# Patient Record
Sex: Female | Born: 1945 | Race: White | Hispanic: No | Smoking: Never smoker
Health system: Southern US, Community
[De-identification: ages and names within clinical notes are randomized; demographics above are authoritative.]

## PROBLEM LIST (undated history)

## (undated) DIAGNOSIS — M1990 Unspecified osteoarthritis, unspecified site: Secondary | ICD-10-CM

## (undated) DIAGNOSIS — I809 Phlebitis and thrombophlebitis of unspecified site: Secondary | ICD-10-CM

## (undated) DIAGNOSIS — M545 Low back pain, unspecified: Secondary | ICD-10-CM

## (undated) DIAGNOSIS — L309 Dermatitis, unspecified: Secondary | ICD-10-CM

## (undated) DIAGNOSIS — I639 Cerebral infarction, unspecified: Secondary | ICD-10-CM

## (undated) DIAGNOSIS — M7061 Trochanteric bursitis, right hip: Secondary | ICD-10-CM

## (undated) DIAGNOSIS — G894 Chronic pain syndrome: Secondary | ICD-10-CM

## (undated) DIAGNOSIS — K8020 Calculus of gallbladder without cholecystitis without obstruction: Secondary | ICD-10-CM

## (undated) DIAGNOSIS — I447 Left bundle-branch block, unspecified: Secondary | ICD-10-CM

## (undated) DIAGNOSIS — I8390 Asymptomatic varicose veins of unspecified lower extremity: Secondary | ICD-10-CM

## (undated) DIAGNOSIS — K5730 Diverticulosis of large intestine without perforation or abscess without bleeding: Secondary | ICD-10-CM

## (undated) DIAGNOSIS — F419 Anxiety disorder, unspecified: Secondary | ICD-10-CM

## (undated) DIAGNOSIS — K219 Gastro-esophageal reflux disease without esophagitis: Secondary | ICD-10-CM

## (undated) DIAGNOSIS — J309 Allergic rhinitis, unspecified: Secondary | ICD-10-CM

## (undated) DIAGNOSIS — H7291 Unspecified perforation of tympanic membrane, right ear: Secondary | ICD-10-CM

## (undated) DIAGNOSIS — N182 Chronic kidney disease, stage 2 (mild): Secondary | ICD-10-CM

## (undated) DIAGNOSIS — E7800 Pure hypercholesterolemia, unspecified: Secondary | ICD-10-CM

## (undated) DIAGNOSIS — M5030 Other cervical disc degeneration, unspecified cervical region: Secondary | ICD-10-CM

## (undated) DIAGNOSIS — M8580 Other specified disorders of bone density and structure, unspecified site: Secondary | ICD-10-CM

## (undated) DIAGNOSIS — K56609 Unspecified intestinal obstruction, unspecified as to partial versus complete obstruction: Secondary | ICD-10-CM

## (undated) DIAGNOSIS — S8991XA Unspecified injury of right lower leg, initial encounter: Secondary | ICD-10-CM

## (undated) DIAGNOSIS — N83209 Unspecified ovarian cyst, unspecified side: Secondary | ICD-10-CM

## (undated) DIAGNOSIS — I10 Essential (primary) hypertension: Secondary | ICD-10-CM

## (undated) DIAGNOSIS — B029 Zoster without complications: Secondary | ICD-10-CM

## (undated) DIAGNOSIS — G8929 Other chronic pain: Secondary | ICD-10-CM

## (undated) HISTORY — DX: Other specified disorders of bone density and structure, unspecified site: M85.80

## (undated) HISTORY — DX: Allergic rhinitis, unspecified: J30.9

## (undated) HISTORY — DX: Zoster without complications: B02.9

## (undated) HISTORY — PX: CHOLECYSTECTOMY, LAPAROSCOPIC: SHX56

## (undated) HISTORY — DX: Essential (primary) hypertension: I10

## (undated) HISTORY — DX: Unspecified ovarian cyst, unspecified side: N83.209

## (undated) HISTORY — DX: Low back pain, unspecified: M54.50

## (undated) HISTORY — DX: Anxiety disorder, unspecified: F41.9

## (undated) HISTORY — DX: Other chronic pain: G89.29

## (undated) HISTORY — PX: CHOLECYSTECTOMY: SHX55

## (undated) HISTORY — DX: Dermatitis, unspecified: L30.9

## (undated) HISTORY — DX: Trochanteric bursitis, right hip: M70.61

## (undated) HISTORY — DX: Diverticulosis of large intestine without perforation or abscess without bleeding: K57.30

## (undated) HISTORY — DX: Unspecified perforation of tympanic membrane, right ear: H72.91

## (undated) HISTORY — DX: Left bundle-branch block, unspecified: I44.7

## (undated) HISTORY — DX: Low back pain: M54.5

## (undated) HISTORY — DX: Calculus of gallbladder without cholecystitis without obstruction: K80.20

## (undated) HISTORY — PX: TUBAL LIGATION: SHX77

## (undated) HISTORY — DX: Gastro-esophageal reflux disease without esophagitis: K21.9

## (undated) HISTORY — DX: Chronic kidney disease, stage 2 (mild): N18.2

## (undated) HISTORY — DX: Unspecified osteoarthritis, unspecified site: M19.90

## (undated) HISTORY — DX: Unspecified injury of right lower leg, initial encounter: S89.91XA

## (undated) HISTORY — PX: COLONOSCOPY: SHX174

## (undated) HISTORY — DX: Other cervical disc degeneration, unspecified cervical region: M50.30

## (undated) HISTORY — PX: COLON SURGERY: SHX602

## (undated) HISTORY — DX: Pure hypercholesterolemia, unspecified: E78.00

## (undated) HISTORY — PX: RECTOCELE REPAIR: SHX761

## (undated) HISTORY — DX: Chronic pain syndrome: G89.4

## (undated) HISTORY — DX: Unspecified intestinal obstruction, unspecified as to partial versus complete obstruction: K56.609

## (undated) HISTORY — PX: OTHER SURGICAL HISTORY: SHX169

## (undated) HISTORY — DX: Cerebral infarction, unspecified: I63.9

---

## 1991-06-28 HISTORY — PX: OTHER SURGICAL HISTORY: SHX169

## 1998-03-04 ENCOUNTER — Ambulatory Visit (HOSPITAL_COMMUNITY): Admission: RE | Admit: 1998-03-04 | Discharge: 1998-03-04 | Payer: Self-pay | Admitting: Gynecology

## 1998-03-05 ENCOUNTER — Ambulatory Visit (HOSPITAL_COMMUNITY): Admission: RE | Admit: 1998-03-05 | Discharge: 1998-03-05 | Payer: Self-pay | Admitting: Gynecology

## 1999-03-09 ENCOUNTER — Encounter: Payer: Self-pay | Admitting: Gynecology

## 1999-03-09 ENCOUNTER — Ambulatory Visit (HOSPITAL_COMMUNITY): Admission: RE | Admit: 1999-03-09 | Discharge: 1999-03-09 | Payer: Self-pay | Admitting: Gynecology

## 1999-06-09 ENCOUNTER — Ambulatory Visit (HOSPITAL_COMMUNITY): Admission: RE | Admit: 1999-06-09 | Discharge: 1999-06-09 | Payer: Self-pay | Admitting: Gastroenterology

## 2000-04-19 ENCOUNTER — Encounter: Payer: Self-pay | Admitting: Gynecology

## 2000-04-19 ENCOUNTER — Ambulatory Visit (HOSPITAL_COMMUNITY): Admission: RE | Admit: 2000-04-19 | Discharge: 2000-04-19 | Payer: Self-pay | Admitting: Gynecology

## 2000-08-25 ENCOUNTER — Ambulatory Visit (HOSPITAL_COMMUNITY): Admission: RE | Admit: 2000-08-25 | Discharge: 2000-08-25 | Payer: Self-pay | Admitting: Neurosurgery

## 2000-08-25 ENCOUNTER — Encounter: Payer: Self-pay | Admitting: Neurosurgery

## 2001-05-03 ENCOUNTER — Encounter: Payer: Self-pay | Admitting: Gynecology

## 2001-05-03 ENCOUNTER — Ambulatory Visit (HOSPITAL_COMMUNITY): Admission: RE | Admit: 2001-05-03 | Discharge: 2001-05-03 | Payer: Self-pay | Admitting: Gynecology

## 2002-05-13 ENCOUNTER — Other Ambulatory Visit: Admission: RE | Admit: 2002-05-13 | Discharge: 2002-05-13 | Payer: Self-pay | Admitting: Gynecology

## 2002-09-04 ENCOUNTER — Encounter: Payer: Self-pay | Admitting: Neurosurgery

## 2002-09-04 ENCOUNTER — Encounter: Admission: RE | Admit: 2002-09-04 | Discharge: 2002-09-04 | Payer: Self-pay | Admitting: Neurosurgery

## 2003-04-08 ENCOUNTER — Other Ambulatory Visit: Admission: RE | Admit: 2003-04-08 | Discharge: 2003-04-08 | Payer: Self-pay | Admitting: Gynecology

## 2003-05-23 ENCOUNTER — Ambulatory Visit (HOSPITAL_COMMUNITY): Admission: RE | Admit: 2003-05-23 | Discharge: 2003-05-23 | Payer: Self-pay | Admitting: Gynecology

## 2004-03-23 ENCOUNTER — Other Ambulatory Visit: Admission: RE | Admit: 2004-03-23 | Discharge: 2004-03-23 | Payer: Self-pay | Admitting: Gynecology

## 2004-04-13 ENCOUNTER — Ambulatory Visit (HOSPITAL_COMMUNITY): Admission: RE | Admit: 2004-04-13 | Discharge: 2004-04-14 | Payer: Self-pay | Admitting: Neurosurgery

## 2004-04-17 ENCOUNTER — Observation Stay (HOSPITAL_COMMUNITY): Admission: AD | Admit: 2004-04-17 | Discharge: 2004-04-18 | Payer: Self-pay | Admitting: Neurological Surgery

## 2004-05-06 ENCOUNTER — Encounter: Admission: RE | Admit: 2004-05-06 | Discharge: 2004-05-06 | Payer: Self-pay | Admitting: Neurosurgery

## 2004-05-24 ENCOUNTER — Ambulatory Visit (HOSPITAL_COMMUNITY): Admission: RE | Admit: 2004-05-24 | Discharge: 2004-05-24 | Payer: Self-pay | Admitting: Gynecology

## 2004-06-03 ENCOUNTER — Encounter: Admission: RE | Admit: 2004-06-03 | Discharge: 2004-06-03 | Payer: Self-pay | Admitting: Neurosurgery

## 2004-08-25 ENCOUNTER — Ambulatory Visit: Payer: Self-pay | Admitting: Pulmonary Disease

## 2004-12-14 ENCOUNTER — Ambulatory Visit: Payer: Self-pay | Admitting: Pulmonary Disease

## 2005-05-16 ENCOUNTER — Ambulatory Visit: Payer: Self-pay | Admitting: Pulmonary Disease

## 2005-05-25 ENCOUNTER — Ambulatory Visit (HOSPITAL_COMMUNITY): Admission: RE | Admit: 2005-05-25 | Discharge: 2005-05-25 | Payer: Self-pay | Admitting: Obstetrics and Gynecology

## 2005-07-15 ENCOUNTER — Ambulatory Visit: Payer: Self-pay | Admitting: Pulmonary Disease

## 2005-10-26 ENCOUNTER — Encounter: Payer: Self-pay | Admitting: Gynecology

## 2005-11-25 ENCOUNTER — Ambulatory Visit: Payer: Self-pay | Admitting: Pulmonary Disease

## 2006-05-11 ENCOUNTER — Ambulatory Visit: Payer: Self-pay | Admitting: Gastroenterology

## 2006-05-24 ENCOUNTER — Ambulatory Visit: Payer: Self-pay | Admitting: Gastroenterology

## 2006-05-30 ENCOUNTER — Ambulatory Visit (HOSPITAL_COMMUNITY): Admission: RE | Admit: 2006-05-30 | Discharge: 2006-05-30 | Payer: Self-pay | Admitting: Obstetrics and Gynecology

## 2006-06-26 ENCOUNTER — Ambulatory Visit: Payer: Self-pay | Admitting: Gastroenterology

## 2006-08-26 ENCOUNTER — Emergency Department (HOSPITAL_COMMUNITY): Admission: EM | Admit: 2006-08-26 | Discharge: 2006-08-26 | Payer: Self-pay | Admitting: Emergency Medicine

## 2007-03-23 ENCOUNTER — Ambulatory Visit: Payer: Self-pay | Admitting: Pulmonary Disease

## 2007-05-07 ENCOUNTER — Other Ambulatory Visit: Admission: RE | Admit: 2007-05-07 | Discharge: 2007-05-07 | Payer: Self-pay | Admitting: Gynecology

## 2007-06-01 ENCOUNTER — Ambulatory Visit (HOSPITAL_COMMUNITY): Admission: RE | Admit: 2007-06-01 | Discharge: 2007-06-01 | Payer: Self-pay | Admitting: Gynecology

## 2007-09-17 DIAGNOSIS — F411 Generalized anxiety disorder: Secondary | ICD-10-CM | POA: Insufficient documentation

## 2007-09-17 DIAGNOSIS — K219 Gastro-esophageal reflux disease without esophagitis: Secondary | ICD-10-CM | POA: Insufficient documentation

## 2007-09-17 DIAGNOSIS — M1990 Unspecified osteoarthritis, unspecified site: Secondary | ICD-10-CM | POA: Insufficient documentation

## 2007-09-18 ENCOUNTER — Ambulatory Visit: Payer: Self-pay | Admitting: Pulmonary Disease

## 2007-09-20 ENCOUNTER — Ambulatory Visit: Payer: Self-pay | Admitting: Pulmonary Disease

## 2007-09-20 ENCOUNTER — Ambulatory Visit (HOSPITAL_COMMUNITY): Admission: RE | Admit: 2007-09-20 | Discharge: 2007-09-20 | Payer: Self-pay | Admitting: Pulmonary Disease

## 2007-10-03 LAB — CONVERTED CEMR LAB
ALT: 24 units/L (ref 0–35)
AST: 26 units/L (ref 0–37)
Albumin: 3.4 g/dL — ABNORMAL LOW (ref 3.5–5.2)
Alkaline Phosphatase: 69 units/L (ref 39–117)
BUN: 12 mg/dL (ref 6–23)
Basophils Absolute: 0 10*3/uL (ref 0.0–0.1)
Basophils Relative: 1.1 % — ABNORMAL HIGH (ref 0.0–1.0)
Bilirubin, Direct: 0.2 mg/dL (ref 0.0–0.3)
CO2: 27 meq/L (ref 19–32)
Calcium: 8.9 mg/dL (ref 8.4–10.5)
Chloride: 105 meq/L (ref 96–112)
Cholesterol: 202 mg/dL (ref 0–200)
Creatinine, Ser: 0.9 mg/dL (ref 0.4–1.2)
Direct LDL: 136.9 mg/dL
Eosinophils Absolute: 0.1 10*3/uL (ref 0.0–0.6)
Eosinophils Relative: 3 % (ref 0.0–5.0)
GFR calc Af Amer: 82 mL/min
GFR calc non Af Amer: 67 mL/min
Glucose, Bld: 104 mg/dL — ABNORMAL HIGH (ref 70–99)
HCT: 37.4 % (ref 36.0–46.0)
HDL: 44.2 mg/dL (ref >39.0)
Hemoglobin: 12.8 g/dL (ref 12.0–15.0)
Lymphocytes Relative: 42.2 % (ref 12.0–46.0)
MCHC: 34.3 g/dL (ref 30.0–36.0)
MCV: 88 fL (ref 78.0–100.0)
Monocytes Absolute: 0.3 10*3/uL (ref 0.2–0.7)
Monocytes Relative: 7.4 % (ref 3.0–11.0)
Neutro Abs: 2.2 10*3/uL (ref 1.4–7.7)
Neutrophils Relative %: 46.3 % (ref 43.0–77.0)
Platelets: 188 10*3/uL (ref 150–400)
Potassium: 4.2 meq/L (ref 3.5–5.1)
RBC: 4.24 M/uL (ref 3.87–5.11)
RDW: 12.4 % (ref 11.5–14.6)
Sodium: 138 meq/L (ref 135–145)
TSH: 1.17 microintl units/mL (ref 0.35–5.50)
Total Bilirubin: 0.8 mg/dL (ref 0.3–1.2)
Total CHOL/HDL Ratio: 4.6
Total Protein: 6.7 g/dL (ref 6.0–8.3)
Triglycerides: 56 mg/dL (ref 0–149)
VLDL: 11 mg/dL (ref 0–40)
WBC: 4.5 10*3/uL (ref 4.5–10.5)

## 2007-10-16 ENCOUNTER — Encounter: Payer: Self-pay | Admitting: Pulmonary Disease

## 2007-11-12 ENCOUNTER — Telehealth (INDEPENDENT_AMBULATORY_CARE_PROVIDER_SITE_OTHER): Payer: Self-pay | Admitting: *Deleted

## 2007-11-13 ENCOUNTER — Ambulatory Visit: Payer: Self-pay | Admitting: Pulmonary Disease

## 2007-11-16 ENCOUNTER — Encounter: Payer: Self-pay | Admitting: Pulmonary Disease

## 2007-11-28 ENCOUNTER — Encounter: Payer: Self-pay | Admitting: Pulmonary Disease

## 2008-03-17 ENCOUNTER — Ambulatory Visit: Payer: Self-pay | Admitting: Pulmonary Disease

## 2008-03-17 DIAGNOSIS — E7800 Pure hypercholesterolemia, unspecified: Secondary | ICD-10-CM | POA: Insufficient documentation

## 2008-03-17 LAB — CONVERTED CEMR LAB
Cholesterol: 183 mg/dL (ref 0–200)
HDL: 36.9 mg/dL — ABNORMAL LOW (ref >39.0)
LDL Cholesterol: 131 mg/dL — ABNORMAL HIGH (ref 0–99)
Total CHOL/HDL Ratio: 5
Triglycerides: 78 mg/dL (ref 0–149)
VLDL: 16 mg/dL (ref 0–40)

## 2008-03-26 ENCOUNTER — Ambulatory Visit: Payer: Self-pay | Admitting: Gynecology

## 2008-05-07 ENCOUNTER — Ambulatory Visit: Payer: Self-pay | Admitting: Gynecology

## 2008-05-07 ENCOUNTER — Other Ambulatory Visit: Admission: RE | Admit: 2008-05-07 | Discharge: 2008-05-07 | Payer: Self-pay | Admitting: Gynecology

## 2008-05-07 ENCOUNTER — Encounter: Payer: Self-pay | Admitting: Gynecology

## 2008-06-02 ENCOUNTER — Ambulatory Visit (HOSPITAL_COMMUNITY): Admission: RE | Admit: 2008-06-02 | Discharge: 2008-06-02 | Payer: Self-pay | Admitting: Gynecology

## 2008-09-15 ENCOUNTER — Ambulatory Visit: Payer: Self-pay | Admitting: Pulmonary Disease

## 2008-09-15 LAB — CONVERTED CEMR LAB: Vit D, 25-Hydroxy: 45 ng/mL (ref 30–89)

## 2008-11-20 ENCOUNTER — Telehealth (INDEPENDENT_AMBULATORY_CARE_PROVIDER_SITE_OTHER): Payer: Self-pay | Admitting: *Deleted

## 2008-11-27 ENCOUNTER — Encounter: Payer: Self-pay | Admitting: Pulmonary Disease

## 2009-05-25 ENCOUNTER — Other Ambulatory Visit: Admission: RE | Admit: 2009-05-25 | Discharge: 2009-05-25 | Payer: Self-pay | Admitting: Gynecology

## 2009-05-25 ENCOUNTER — Ambulatory Visit: Payer: Self-pay | Admitting: Gynecology

## 2009-06-04 ENCOUNTER — Ambulatory Visit (HOSPITAL_COMMUNITY): Admission: RE | Admit: 2009-06-04 | Discharge: 2009-06-04 | Payer: Self-pay | Admitting: Gynecology

## 2009-06-08 ENCOUNTER — Encounter: Payer: Self-pay | Admitting: Pulmonary Disease

## 2009-06-22 ENCOUNTER — Telehealth (INDEPENDENT_AMBULATORY_CARE_PROVIDER_SITE_OTHER): Payer: Self-pay | Admitting: *Deleted

## 2009-06-29 ENCOUNTER — Telehealth: Payer: Self-pay | Admitting: Pulmonary Disease

## 2009-06-29 ENCOUNTER — Ambulatory Visit: Payer: Self-pay | Admitting: Pulmonary Disease

## 2009-07-01 ENCOUNTER — Ambulatory Visit: Payer: Self-pay | Admitting: Gynecology

## 2009-07-08 ENCOUNTER — Ambulatory Visit: Payer: Self-pay | Admitting: Pulmonary Disease

## 2009-07-08 DIAGNOSIS — R748 Abnormal levels of other serum enzymes: Secondary | ICD-10-CM | POA: Insufficient documentation

## 2009-07-12 LAB — CONVERTED CEMR LAB
ALT: 25 units/L (ref 0–35)
AST: 28 units/L (ref 0–37)
Albumin: 3.9 g/dL (ref 3.5–5.2)
Alkaline Phosphatase: 44 units/L (ref 39–117)
BUN: 10 mg/dL (ref 6–23)
Basophils Absolute: 0.1 10*3/uL (ref 0.0–0.1)
Basophils Relative: 1.2 % (ref 0.0–3.0)
Bilirubin, Direct: 0.1 mg/dL (ref 0.0–0.3)
CO2: 28 meq/L (ref 19–32)
Calcium: 9.2 mg/dL (ref 8.4–10.5)
Chloride: 108 meq/L (ref 96–112)
Cholesterol: 172 mg/dL (ref 0–200)
Creatinine, Ser: 0.9 mg/dL (ref 0.4–1.2)
Eosinophils Absolute: 0.1 10*3/uL (ref 0.0–0.7)
Eosinophils Relative: 2.8 % (ref 0.0–5.0)
GFR calc non Af Amer: 66.84 mL/min (ref >60)
Glucose, Bld: 90 mg/dL (ref 70–99)
HCT: 37.7 % (ref 36.0–46.0)
HDL: 45.6 mg/dL (ref >39.00)
Hemoglobin: 12.9 g/dL (ref 12.0–15.0)
LDL Cholesterol: 113 mg/dL — ABNORMAL HIGH (ref 0–99)
Lymphocytes Relative: 47.7 % — ABNORMAL HIGH (ref 12.0–46.0)
Lymphs Abs: 2.1 10*3/uL (ref 0.7–4.0)
MCHC: 34.1 g/dL (ref 30.0–36.0)
MCV: 90.8 fL (ref 78.0–100.0)
Monocytes Absolute: 0.3 10*3/uL (ref 0.1–1.0)
Monocytes Relative: 7 % (ref 3.0–12.0)
Neutro Abs: 1.9 10*3/uL (ref 1.4–7.7)
Neutrophils Relative %: 41.3 % — ABNORMAL LOW (ref 43.0–77.0)
Platelets: 149 10*3/uL — ABNORMAL LOW (ref 150.0–400.0)
Potassium: 4.4 meq/L (ref 3.5–5.1)
RBC: 4.15 M/uL (ref 3.87–5.11)
RDW: 12.6 % (ref 11.5–14.6)
Sodium: 139 meq/L (ref 135–145)
TSH: 1.29 microintl units/mL (ref 0.35–5.50)
Total Bilirubin: 0.7 mg/dL (ref 0.3–1.2)
Total CHOL/HDL Ratio: 4
Total Protein: 6.2 g/dL (ref 6.0–8.3)
Triglycerides: 66 mg/dL (ref 0.0–149.0)
VLDL: 13.2 mg/dL (ref 0.0–40.0)
Vit D, 25-Hydroxy: 42 ng/mL (ref 30–89)
WBC: 4.5 10*3/uL (ref 4.5–10.5)

## 2010-03-12 ENCOUNTER — Encounter (INDEPENDENT_AMBULATORY_CARE_PROVIDER_SITE_OTHER): Payer: Self-pay | Admitting: *Deleted

## 2010-04-07 ENCOUNTER — Telehealth: Payer: Self-pay | Admitting: Gastroenterology

## 2010-04-09 ENCOUNTER — Ambulatory Visit: Payer: Self-pay | Admitting: Gynecology

## 2010-05-17 ENCOUNTER — Telehealth (INDEPENDENT_AMBULATORY_CARE_PROVIDER_SITE_OTHER): Payer: Self-pay | Admitting: *Deleted

## 2010-05-24 ENCOUNTER — Ambulatory Visit: Payer: Self-pay | Admitting: Pulmonary Disease

## 2010-06-03 ENCOUNTER — Other Ambulatory Visit
Admission: RE | Admit: 2010-06-03 | Discharge: 2010-06-03 | Payer: Self-pay | Source: Home / Self Care | Admitting: Gynecology

## 2010-06-04 ENCOUNTER — Ambulatory Visit: Payer: Self-pay | Admitting: Gynecology

## 2010-06-07 ENCOUNTER — Ambulatory Visit (HOSPITAL_COMMUNITY)
Admission: RE | Admit: 2010-06-07 | Discharge: 2010-06-07 | Payer: Self-pay | Source: Home / Self Care | Attending: Gynecology | Admitting: Gynecology

## 2010-07-18 ENCOUNTER — Encounter: Payer: Self-pay | Admitting: Gastroenterology

## 2010-07-25 LAB — CONVERTED CEMR LAB
ALT: 19 units/L (ref 0–35)
AST: 25 units/L (ref 0–37)
Albumin: 4 g/dL (ref 3.5–5.2)
Alkaline Phosphatase: 54 units/L (ref 39–117)
BUN: 11 mg/dL (ref 6–23)
Basophils Absolute: 0 10*3/uL (ref 0.0–0.1)
Basophils Relative: 0.6 % (ref 0.0–3.0)
Bilirubin, Direct: 0.2 mg/dL (ref 0.0–0.3)
CO2: 29 meq/L (ref 19–32)
Calcium: 9.2 mg/dL (ref 8.4–10.5)
Chloride: 105 meq/L (ref 96–112)
Cholesterol: 169 mg/dL (ref 0–200)
Creatinine, Ser: 0.9 mg/dL (ref 0.4–1.2)
Eosinophils Absolute: 0.2 10*3/uL (ref 0.0–0.7)
Eosinophils Relative: 3.2 % (ref 0.0–5.0)
GFR calc non Af Amer: 67.01 mL/min (ref >60)
Glucose, Bld: 101 mg/dL — ABNORMAL HIGH (ref 70–99)
HCT: 38 % (ref 36.0–46.0)
HDL: 51.2 mg/dL (ref >39.00)
Hemoglobin: 13.4 g/dL (ref 12.0–15.0)
LDL Cholesterol: 108 mg/dL — ABNORMAL HIGH (ref 0–99)
Lymphocytes Relative: 47.5 % — ABNORMAL HIGH (ref 12.0–46.0)
Lymphs Abs: 2.2 10*3/uL (ref 0.7–4.0)
MCHC: 35.2 g/dL (ref 30.0–36.0)
MCV: 89.3 fL (ref 78.0–100.0)
Monocytes Absolute: 0.3 10*3/uL (ref 0.1–1.0)
Monocytes Relative: 6.9 % (ref 3.0–12.0)
Neutro Abs: 2 10*3/uL (ref 1.4–7.7)
Neutrophils Relative %: 41.8 % — ABNORMAL LOW (ref 43.0–77.0)
Platelets: 160 10*3/uL (ref 150.0–400.0)
Potassium: 4.4 meq/L (ref 3.5–5.1)
RBC: 4.26 M/uL (ref 3.87–5.11)
RDW: 12.2 % (ref 11.5–14.6)
Sodium: 141 meq/L (ref 135–145)
TSH: 1.02 microintl units/mL (ref 0.35–5.50)
Total Bilirubin: 0.7 mg/dL (ref 0.3–1.2)
Total CHOL/HDL Ratio: 3
Total Protein: 6.7 g/dL (ref 6.0–8.3)
Triglycerides: 47 mg/dL (ref 0.0–149.0)
VLDL: 9.4 mg/dL (ref 0.0–40.0)
WBC: 4.7 10*3/uL (ref 4.5–10.5)

## 2010-07-29 NOTE — Assessment & Plan Note (Signed)
Summary: NP follow up - multiple complaints   CC:  follow up and med refills.  History of Present Illness: 65  year old female with known history of HTN and GERD   May 24, 2010--Presents for follow up. She needs her meds refiled. She has her upcoming ov with Dr. Audie Box for her Pap and mammogram.  She continues to have trouble with diffuse arthritis in neck, back and hands. Uses mobic most days. Complains of sore muscles all over-"my fibromyalgia". We discussed other pain med options but she wants to cont on mobic only. Complains of intermittent loose stools . She has last colonoscopy in 2007. No bloody stools or weight loss. She uses miralax once daily, advised to change this to as needed use only. Denies chest pain, dyspnea, orthopnea, hemoptysis, fever, n/v/d, edema, headache,recent travel or antibiotics.    Medications Prior to Update: 1)  Flonase 50 Mcg/act Susp (Fluticasone Propionate) .... Use 2 Sprays in Each Nostril At Bedtime 2)  Mucinex 600 Mg  Tb12 (Guaifenesin) .Marland Kitchen.. 1-2 Tabs By Mouth Two Times A Day W/ Plenty of Fluids.Marland KitchenMarland Kitchen 3)  Verapamil Hcl Cr 180 Mg  Tbcr (Verapamil Hcl) .Marland Kitchen.. 1 Tab By Mouth Once Daily For Bp... 4)  Lisinopril 10 Mg  Tabs (Lisinopril) .... Take 1 Tablet By Mouth Once A Day 5)  Caltrate 600+d Plus 600-400 Mg-Unit Tabs (Calcium Carbonate-Vit D-Min) .... Take 1 Tab By Mouth Once Daily.Marland KitchenMarland Kitchen 6)  Womens Multivitamin Plus  Tabs (Multiple Vitamins-Minerals) .... Take 1 Tab By Mouth Once Daily.Marland KitchenMarland Kitchen 7)  Vitamin D 2000 Unit Tabs (Cholecalciferol) .... Take 1 Tablet By Mouth Once A Day 8)  Alprazolam 0.5 Mg Tabs (Alprazolam) .... Take 1/2-1 Tablet By Mouth Three Times A Day As Needed For Nerves.Marland KitchenMarland Kitchen 9)  Miralax   Powd (Polyethylene Glycol 3350) .... As Needed 10)  Meloxicam 15 Mg  Tabs (Meloxicam) .Marland Kitchen.. 1 Tab By Mouth Once Daily As Needed For Arthritis Pain...  Current Medications (verified): 1)  Flonase 50 Mcg/act Susp (Fluticasone Propionate) .... Use 2 Sprays in Each Nostril  At Bedtime 2)  Mucinex 600 Mg  Tb12 (Guaifenesin) .Marland Kitchen.. 1-2 Tabs By Mouth Two Times A Day W/ Plenty of Fluids As Needed 3)  Verapamil Hcl Cr 180 Mg  Tbcr (Verapamil Hcl) .Marland Kitchen.. 1 Tab By Mouth Once Daily For Bp... 4)  Lisinopril 10 Mg  Tabs (Lisinopril) .... Take 1 Tablet By Mouth Once A Day 5)  Miralax   Powd (Polyethylene Glycol 3350) .... As Needed 6)  Meloxicam 15 Mg  Tabs (Meloxicam) .Marland Kitchen.. 1 Tab By Mouth Once Daily As Needed For Arthritis Pain... 7)  Caltrate 600+d Plus 600-400 Mg-Unit Tabs (Calcium Carbonate-Vit D-Min) .... Take 1 Tab By Mouth Once Daily.Marland KitchenMarland Kitchen 8)  Womens Multivitamin Plus  Tabs (Multiple Vitamins-Minerals) .... Take 1 Tab By Mouth Once Daily.Marland KitchenMarland Kitchen 9)  Vitamin D 2000 Unit Tabs (Cholecalciferol) .... Take 1 Tablet By Mouth Once A Day 10)  Alprazolam 0.5 Mg Tabs (Alprazolam) .... Take 1/2-1 Tablet By Mouth Three Times A Day As Needed For Nerves...  Allergies (verified): 1)  ! Prednisone  Past History:  Past Medical History: Last updated: 06/29/2009 ALLERGY (ICD-995.3) HYPERTENSION (ICD-401.9) HYPERCHOLESTEROLEMIA (ICD-272.0) GERD (ICD-530.81) Hx of DIVERTICULITIS OF COLON (ICD-562.11) DEGENERATIVE JOINT DISEASE (ICD-715.90) ANXIETY (ICD-300.00)  Past Surgical History: Last updated: 06/29/2009 S/P CSpine surgery (3 level ant cerv diskectomy & fusion w/ plating) 2003 by DrPool S/P right hemicolectomy for diverticulitis w/ abscess in 1993 S/P cholecystectomy 1997 S/P hysterectomy  Family History: Last updated: 10/03/08 Father died  age 55 w/ ASHD- MI, AAA... Mother died age 66 from old age, ASHD, divertics... 10 Siblings- 5 have passed away- 1 child age 56 w/ whooping cough, 1 lupus, 1 melanoma, 1 lung cancer, 1 pulm fibrosis... 1 Sis has heart disease, 1 Sis is [employer]...  Social History: Last updated: 05/24/2010 Married to [employer]" [place] for 48 yrs... 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 No Comments Medications reviewed with patient Daytime contact number verified with patient. Boone Master CNA/MA  May 24, 2010 10:39 AM    Physical Exam  Additional Exam:  WD, WN, 65 y/o WF in NAD... GENERAL:  Alert & oriented; pleasant & cooperative... HEENT:  [place]/AT, EOM-wnl, PERRLA, EACs- min wax, NOSE- neg, THROAT- clear & wnl. NECK:  Supple w/ decrROM & ant cx scar; no JVD; normal carotid impulses w/o bruits; no thyromegaly or nodules palpated; no lymphadenopathy. CHEST:  Clear to P & A; without wheezes/ rales/ or rhonchi heard... HEART:  Regular Rhythm; without murmurs/ rubs/ or gallops detected... ABDOMEN:  Soft & nontender; normal bowel sounds; no organomegaly or masses detected. EXT: without deformities, mild arthritic changes; small varicosites along lower legs. / +venous insuffic/ no edema. cervical rom sl decreased w/ no radicular symptosm, nml grips bilaterally, postive trigger spots along upper back and upper arms.  diffuse tenderness along chest wall , arms, hands w/ light touch. skin intact w/ no rash.  NEURO:  CN's intact; no focal neuro deficits noted.  DERM:  No lesions noted; no rash etc...     Impression &  Recommendations:  Problem # 1:  HYPERTENSION (ICD-401.9)  controlled on rx  Her updated medication list for this problem includes:    Verapamil Hcl Cr 180 Mg Tbcr (Verapamil hcl) .Marland Kitchen... 1 tab by mouth once daily for bp...    Lisinopril 10 Mg Tabs (Lisinopril) .Marland Kitchen... Take 1 tablet by mouth once a day  Orders: Est. Patient Level IV ([address])  Problem # 2:  HYPERCHOLESTEROLEMIA (ICD-272.0)  cont w/ diet -low fat cholestrol   Labs Reviewed: SGOT: 28 (07/08/2009)   SGPT: 25 (07/08/2009)   HDL:45.60 (07/08/2009), 51.20 (09/15/2008)  LDL:113 (07/08/2009), 108 (09/15/2008)  Chol:172 (07/08/2009), 169 (09/15/2008)  Trig:66.0 (07/08/2009), 47.0 (09/15/2008)  Orders: Est. Patient Level IV ([address])  Problem # 3:  GERD (ICD-530.81)  controlled on diet   Orders: Est. Patient Level IV ([address])  Problem # 4:  DEGENERATIVE JOINT DISEASE (ICD-715.90)  add heat therapy.  cont on mobic as needed  Her updated medication list for this problem  includes:    Meloxicam 15 Mg Tabs (Meloxicam) .Marland Kitchen... 1 tab by mouth once daily as needed for arthritis pain...  Orders: Est. Patient Level IV ([address])  Medications Added to Medication List This Visit: 1)  Mucinex 600 Mg Tb12 (Guaifenesin) .Marland Kitchen.. 1-2 tabs by mouth two times a day w/ plenty of fluids as needed  Complete Medication List: 1)  Flonase 50 Mcg/act Susp (Fluticasone propionate) .... Use 2 sprays in each nostril at bedtime 2)  Verapamil Hcl Cr 180 Mg Tbcr (Verapamil hcl) .Marland Kitchen.. 1 tab by mouth once daily for bp... 3)  Lisinopril 10 Mg Tabs (Lisinopril) .... Take 1 tablet by mouth once a day 4)  Caltrate 600+d Plus 600-400 Mg-unit Tabs (Calcium carbonate-vit d-min) .... Take 1 tab by mouth once daily.Marland KitchenMarland Kitchen 5)  Womens Multivitamin Plus Tabs (Multiple vitamins-minerals) .... Take 1 tab by mouth once daily.Marland KitchenMarland Kitchen 6)  Vitamin D 2000 Unit Tabs (Cholecalciferol) .... Take 1 tablet by mouth once a day 7)  Alprazolam 0.5 Mg Tabs (Alprazolam) .... Take 1/2-1 tablet by  mouth three times a day as needed for nerves... 8)  Mucinex 600 Mg Tb12 (Guaifenesin) .Marland Kitchen.. 1-2 tabs by mouth two times a day w/ plenty of fluids as needed 9)  Miralax Powd (Polyethylene glycol 3350) .... As needed 10)  Meloxicam 15 Mg Tabs (Meloxicam) .Marland Kitchen.. 1 tab by mouth once daily as needed for arthritis pain...  Patient Instructions: 1)  Pnuemovax today.  2)  Continue on same meds  3)  follow up Dr. Kriste Basque in 6 months  4)  Warm heat to shoulder and neck three times a day as needed  5)  Low fat cholestrol diet., stay acitve as tolerated.  6)  Decrease Miralax and use as needed for constipation.  Prescriptions: MELOXICAM 15 MG  TABS (MELOXICAM) 1 tab by mouth once daily as needed for arthritis pain...  #90 x 3   Entered and Authorized by:   Rubye Oaks NP   Signed by:   Rubye Oaks NP on 05/24/2010   Method used:   [facility]        [facility] [phone]* (retail)       [address]       [place], [place]  [address]       Ph: [phone] or [phone]       Fax: [phone]   RxID:   [id number] LISINOPRIL 10 MG  TABS (LISINOPRIL) Take 1 tablet by mouth once a day  #90 x 3   Entered and Authorized by:   Rubye Oaks NP   Signed by:   Rubye Oaks NP on 05/24/2010   Method used:   [facility] to        [facility] [phone]* (retail)       [address]       [place], [place]  [address]       Ph: [phone] or [phone]       Fax: [phone]   RxID:   [id number] VERAPAMIL HCL CR 180 MG  TBCR (VERAPAMIL HCL) 1 tab by mouth once daily for BP...  #90 x 3   Entered and Authorized by:   Rubye Oaks NP   Signed by:   Rubye Oaks NP on 05/24/2010   Method used:   [facility] to        [facility] [phone]* (retail)  [address]       [place], [place]  [address]       Ph: [phone] or [phone]       Fax: [phone]   RxID:    [id number] FLONASE 50 MCG/ACT SUSP (FLUTICASONE PROPIONATE) use 2 sprays in each nostril at bedtime  #3 x 3   Entered and Authorized by:   Rubye Oaks NP   Signed by:   Rubye Oaks NP on 05/24/2010   Method used:   Electronically to        [facility] [phone]* (retail)       [address]       [place], [place]  [address]       Ph: [phone] or [phone]       Fax: [phone]   RxID:   [id number]     Appended Document: Orders Update-PNA charge//jwr     Clinical Lists Changes  Orders: Added new Service order of Pneumococcal Vaccine ([address]) - Signed Added new Service order of Admin 1st Vaccine (176[address]) - Signed Observations: Added new observation of PNEUMOVAXLOT: 1297AA (05/24/2010 11:22) Added new observation of PNEUMOVAXEXP: 10/22/2011 (05/24/2010 11:22) Added new observation of PNEUMOVAXBY: Zackery Barefoot CMA (05/24/2010 11:22) Added new observation of PNEUMOVAXRTE: IM (05/24/2010 11:22) Added new observation of PNEUMOVAXDOS: 0.5 ml (05/24/2010 11:22) Added new observation of PNEUMOVAXMFR: Merck (05/24/2010 11:22) Added new observation of PNEUMOVAXSIT: left deltoid (05/24/2010 11:22) Added new observation of PNEUMOVAX: Pneumovax (Medicare) (05/24/2010 11:22)       Immunizations Administered:  Pneumonia Vaccine:    Vaccine Type: Pneumovax (Medicare)    Site: left deltoid    Mfr: Merck    Dose: 0.5 ml    Route: IM    Given by: Zackery Barefoot CMA    Exp. Date: 10/22/2011    Lot #: [id number]

## 2010-07-29 NOTE — Letter (Signed)
Summary: New Patient letter  [facility]  [address] [place], [place] [address]   Phone: [phone]  Fax: [phone]       03/12/2010 MRN: [id number]  Alison Cuevas [address] [place], [place]  [address]  Dear Alison Cuevas,  Welcome to the Gastroenterology Division at [facility]    You are scheduled to see Dr. Jarold Motto on 04/29/2010 at 10:00AM on the 3rd floor at [facility], [address]  We ask that you try to arrive at our office 15 minutes prior to your appointment time to allow for check-in.  We would like you to complete the enclosed self-administered evaluation form prior to your visit and bring it with you on the day of your appointment.  We will review it with you.  Also, please bring a complete list of all your medications or, if you prefer, bring the medication bottles and we will list them.  Please bring your insurance card so that we may make a copy of it.  If your insurance requires a referral to see a specialist, please bring your referral form from your primary care physician.  Co-payments are due at the time of your visit and may be paid by cash, check or credit card.     Your office visit will consist of a consult with your physician (includes a physical exam), any laboratory testing he/she may order, scheduling of any necessary diagnostic testing (e.g. x-ray, ultrasound, CT-scan), and scheduling of a procedure (e.g. Endoscopy, Colonoscopy) if required.  Please allow enough time on your schedule to allow for any/all of these possibilities.    If you cannot keep your appointment, please call [phone] to cancel or reschedule prior to your appointment date.  This allows [place] the opportunity to schedule an appointment for another patient in need of care.  If you do not cancel or reschedule by 5 p.m. the business day prior to your appointment date, you will be charged a $50.00 late cancellation/no-show fee.    Thank you for choosing  [facility] for your medical needs.  We appreciate the opportunity to care for you.  Please visit [place] at our website  to learn more about our practice.                     Sincerely,                                                             The Gastroenterology Division

## 2010-07-29 NOTE — Progress Notes (Signed)
Summary: Diarrhea   Phone Note Call from Patient Call back at Home Phone [phone]   Call For: Dr Jarold Motto Reason for Call: Talk to Nurse Summary of Call: Diarrhea real bad and cant hold bowels. Hs an appt 11-3 but just feels like that is too far away. Initial call taken by: Leanor Kail [facility],  April 07, 2010 9:17 AM  Follow-up for Phone Call        Patient called to report one episode of diarrhea this AM after eating fresh salad last night.. Pt advised to take her Imodium. She will call back if diarrhea gets worse or has other problems. Pt will keep appointment for 04/29/10 with Dr. Jarold Motto. Follow-up by: Jesse Fall RN,  April 07, 2010 9:51 AM

## 2010-07-29 NOTE — Progress Notes (Signed)
Summary: bloodwork  Phone Note Call from Patient Call back at Home Phone [phone]   Caller: Patient Call For: Verdell Kincannon Reason for Call: Talk to Nurse Summary of Call: does pt need more bloodwork, fasting, for todays appt? Initial call taken by: Eugene Gavia,  June 29, 2009 9:21 AM  Follow-up for Phone Call        Pt has appt today at 3:30. Does she need to be fasting? Pleae advise. Carron Curie CMA  June 29, 2009 9:25 AM    no she does not need to be fasting for her appt this afternoon.  if SN wants fasting blood work done she can come back at a later date for this.  thanks Randell Loop CMA  June 29, 2009 10:13 AM   Additional Follow-up for Phone Call Additional follow up Details #1::        Pt informed. Zackery Barefoot CMA  June 29, 2009 10:31 AM

## 2010-07-29 NOTE — Progress Notes (Signed)
Summary: alprazolam refill  Phone Note Call from Patient   Caller: Patient Call For: nadel Summary of Call: pt have appointment 11/28/ would like [facility]prazalam [facility]ill [facility] [phone] Initial call taken by: Rickard Patience,  May 17, 2010 4:49 PM  Follow-up for Phone Call        pls ask sn if pt can have refill until ov?   Philipp Deputy [facility]  May 17, 2010 4:55 PM   per SN: ok to fill alprazolam 0.5mg  #90 1/2-1 by mouth three times a day, no refills.  [facility].  pt is aware. Boone Master CNA/MA  May 17, 2010 5:30 PM       Appended Document: alprazolam refill     Clinical Lists Changes  Medications: Rx of ALPRAZOLAM 0.5 MG TABS (ALPRAZOLAM) Take 1/2-1 tablet by mouth three times a day as needed for nerves...;  #90 x 0;  Signed;  Entered by: Boone Master CNA/MA;  Authorized by: Michele Mcalpine MD;  Method used: [facility] to [facility] [phone]*, [address], [place], [place], [place]  [address], Ph: [phone] or [phone], Fax: [phone]    Prescriptions: ALPRAZOLAM 0.5 MG TABS (ALPRAZOLAM) Take 1/2-1 tablet by mouth three times a day as needed for nerves...  #90 x 0   Entered by:   Boone Master CNA/MA   Authorized by:   Michele Mcalpine MD   Signed by:   Boone Master CNA/MA on 05/18/2010   Method used:   Telephoned to ...       [facility]  [address] [phone]* (retail)       [address]       [place], [place]  [address]       Ph: [phone] or [phone]       Fax: [phone]   RxID:   [id number]

## 2010-07-29 NOTE — Assessment & Plan Note (Signed)
Summary: follow up bumped from 12/27  ///  cj   CC:  10 month ROV & review of mult medical problems....  History of Present Illness: 65 y/o WF here for a follow up office visit... he has multiple medical problems as noted below...    ~  Sep09:  she has had a good 6 mnoths and has lost 20# (down to 150#) on her diet!!!  Her BP is great and she's hoping that her cholesterol will be improved as well (sl better but LDL still 131)... her CC= aching/ sore/ malaise- c/w fibromyalgia...   ~  September 15, 2008:  she had a gastroenteritis  ~3wks ago w/ nausea, vomiting, gas, diarrhea- all improved now... she is due for CXR, EKG, fasting blood work, and needs refill perscriptions for 2010...   ~  June 29, 2009:  generally stable but under alot of stress- [name] helps... she is concerned about sinuses & saw [facility] who thought her symptoms were stress related and didn't do anything... offered her CT Sinus but she wants to wait, and in the meanwhile she will Rx w/ Saline, Mucinex, Flonase... BP is controlled on Rx;  on diet alone for Chol & wt up 3# this yr;  persist prob w/ pelvic prolapse Rx by DrFontaine recently & he has BMD scheduled as well...  she will ret for FASTING labs soon>>    Current Problems:   ALLERGY (ICD-995.3) - she has allergic rhinitis symptoms and increased difficulty w/ the warmer weather... she's been on LORATIDINE 10mg /d, plus saline nasal spray during the day, and FLONASE in each nostril Qhs... she uses Mucinex OTC as needed and had DrCNewman wash out her ears...  ~  f/u eval DrCNewman 12/10 for "crazy feelin in my head"- rec continued Saline lavage, Flonase, etc...  HYPERTENSION (ICD-401.9) - controlled on VERAPAMIL SR 180mg /d + LISINOPRIL 10mg /d... BP=128/70 and similar at home, tolerating Rx well... denies HA, visual changes, CP, palipit, dizziness, syncope, dyspnea, edema, etc... both meds [facility] and she is pleased w/ the results...  HYPERCHOLESTEROLEMIA  (ICD-272.0) - on diet alone...  ~  FLP 3/09 showed Tchol 202, Tg 56, HDL 44, LDL 137... rec- diet efforts & get wt down...  ~  FLP 9/09 showed TChol 183, TG 78, HDL 37, LDL 131...  ~  FLP 3/10 (wt=146#) showed TChol 169, TG 47, HDL 51, LDL 108... improved on diet Rx...  ~  FLP 1/11 (wt=149#) showed TChol    GERD (ICD-530.81) - no recent symptoms.. she uses OTC Prilosec as needed.  Hx of DIVERTICULITIS OF COLON (ICD-562.11) - she uses MIRALAX daily...s/p right hemicolectomy 10/93 by Lurline Hare... last colonoscopy was 11/07 by DrPatterson showing normal left colon... she's had adhesions and recurrent abd pain due to IBS- eval by GI/ DrPatterson...  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  ~  7/10: eval by drMacDiarmid for Urology- he rec watchful waiting at this time...  ~  12/10: she had f/u DrFontaine for GYN- he plans BMD as well...  DEGENERATIVE JOINT DISEASE (ICD-715.90) - she requests MOBIC 15mg /d as this helped her relative and is on the [facility] list too... she has been followed by DrPool for neurosurgery- she had a 3 level anterior cervical diskectomy and fusion w/ plating 10/03... 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  ANXIETY (ICD-300.00) - on ALPRAZOLAM 0.5mg  as needed... she was anxious about AAA in  one of her parents- we did an AbdSonar 3/09 that was neg.  Health Maintenance - GYN= DrFontaine for PAP, BMD, and Mammograms at [facility]... she takes Calcium, women's MVI, Vit D...    Allergies: 1)  ! Prednisone  Comments:  Nurse/Medical Assistant: The patient's medications and allergies were reviewed with the patient and were updated in the Medication and Allergy Lists.  Past History:  Past Medical History: ALLERGY (ICD-995.3) HYPERTENSION (ICD-401.9) HYPERCHOLESTEROLEMIA (ICD-272.0) GERD (ICD-530.81) Hx of DIVERTICULITIS OF COLON  (ICD-562.11) DEGENERATIVE JOINT DISEASE (ICD-715.90) ANXIETY (ICD-300.00)  Past Surgical History: S/P CSpine surgery (3 level ant cerv diskectomy & fusion w/ plating) 2003 by DrPool S/P right hemicolectomy for diverticulitis w/ abscess in 1993 S/P cholecystectomy 1997 S/P hysterectomy  Family History: Reviewed history from 09/15/2008 and no changes required. Father died age 667 w/ ASHD- MI, AAA... Mother died age 97 from old age, ASHD, divertics... 10 Siblings- 5 have passed away- 1 child age 66 w/ whooping cough, 1 lupus, 1 melanoma, 1 lung cancer, 1 pulm fibrosis... 1 Sis has heart disease, 1 Sis is [employer]...  Social History: Reviewed history from 09/15/2008 and no changes required. Married to [employer]" [place] for 48 yrs... 3 children- 2 sons alive & well 1 daughter overweight never smoked no alcohol employ: cleaning business  Review of Systems      See HPI       The patient complains of headaches.  The patient denies anorexia, fever, weight loss, weight gain, vision loss, decreased hearing, hoarseness, chest pain, syncope, dyspnea on exertion, peripheral edema, prolonged cough, hemoptysis, abdominal pain, melena, hematochezia, severe indigestion/heartburn, hematuria, incontinence, muscle weakness, suspicious skin lesions, transient blindness, difficulty walking, depression, unusual weight change, abnormal bleeding, enlarged lymph nodes, and angioedema.    Vital Signs:  Patient profile:   65 year old female Height:      61.5 inches Weight:      149.13 pounds BMI:     27.82 O2 Sat:      97 % on Room air Temp:     98.6 degrees F oral Pulse rate:   68 / minute BP sitting:   128 / 70  (left arm) Cuff size:   regular  Vitals Entered By: Randell Loop CMA (June 29, 2009 3:09 PM)  O2 Sat at Rest %:  97 O2 Flow:  Room air CC: 10 month ROV & review of mult medical problems... Comments meds updated today   Physical Exam  Additional Exam:  WD, WN, 65 y/o  WF in NAD... GENERAL:  Alert & oriented; pleasant & cooperative... HEENT:  [place]/AT, EOM-wnl, PERRLA, EACs- min wax, NOSE- neg, THROAT- clear & wnl. NECK:  Supple w/ decrROM & ant cx scar; no JVD; normal carotid impulses w/o bruits; no thyromegaly or nodules palpated; no lymphadenopathy. CHEST:  Clear to P & A; without wheezes/ rales/ or rhonchi heard... HEART:  Regular Rhythm; without murmurs/ rubs/ or gallops detected... ABDOMEN:  Soft & nontender; normal bowel sounds; no organomegaly or masses detected. EXT: without deformities, mild arthritic changes; no varicose veins/ +venous insuffic/ no edema. NEURO:  CN's intact; no focal neuro deficits; normal DTR's... DERM:  No lesions noted; no rash etc...     Impression & Recommendations:  Problem # 1:  ALLERGY (ICD-995.3) She will continue w/ Loratadine OTC, Saline nasal spray, Mucinex, FLONASE Qhs... Offered CT sinus & she will decide & call us if she wants to proceed...  Problem # 2:  HYPERTENSION (ICD-401.9) Controlled-  same meds. Her updated  medication list for this problem includes:    Verapamil Hcl Cr 180 Mg Tbcr (Verapamil hcl) .Marland Kitchen... 1 tab by mouth once daily for bp...    Lisinopril 10 Mg Tabs (Lisinopril) .Marland Kitchen... Take 1 tablet by mouth once a day  Problem # 3:  HYPERCHOLESTEROLEMIA (ICD-272.0) On diet alone & we reviewed low chol/ low fat diet... ret for FLP next week.  Problem # 4:  GERD (ICD-530.81) Stable on the OTC Prilosec Prn...  Problem # 5:  Hx of DIVERTICULITIS OF COLON (ICD-562.11) She had last colon 2007 & up to date...  Problem # 6:  GYN w/ Pelvic Organ Prolapse>>> GYN per DrFontaine w/ the help of Urology- DrMacDiarmid...  Problem # 7:  DEGENERATIVE JOINT DISEASE (ICD-715.90) She uses the Mobic as needed... stable. Her updated medication list for this problem includes:    Meloxicam 15 Mg Tabs (Meloxicam) .Marland Kitchen... 1 tab by mouth once daily as needed for arthritis pain...  Problem # 8:  ANXIETY (ICD-300.00) She  takes the Alprazolam as needed... Her updated medication list for this problem includes:    Alprazolam 0.5 Mg Tabs (Alprazolam) .Marland Kitchen... Take 1/2-1 tablet by mouth three times a day as needed for nerves...  Complete Medication List: 1)  Flonase 50 Mcg/act Susp (Fluticasone propionate) .... Use 2 sprays in each nostril at bedtime 2)  Mucinex 600 Mg Tb12 (Guaifenesin) .Marland Kitchen.. 1-2 tabs by mouth two times a day w/ plenty of fluids.Marland KitchenMarland Kitchen 3)  Verapamil Hcl Cr 180 Mg Tbcr (Verapamil hcl) .Marland Kitchen.. 1 tab by mouth once daily for bp... 4)  Lisinopril 10 Mg Tabs (Lisinopril) .... Take 1 tablet by mouth once a day 5)  Miralax Powd (Polyethylene glycol 3350) .... As needed 6)  Meloxicam 15 Mg Tabs (Meloxicam) .Marland Kitchen.. 1 tab by mouth once daily as needed for arthritis pain.Marland KitchenMarland Kitchen 7)  Caltrate 600+d Plus 600-400 Mg-unit Tabs (Calcium carbonate-vit d-min) .... Take 1 tab by mouth once daily.Marland KitchenMarland Kitchen 8)  Womens Multivitamin Plus Tabs (Multiple vitamins-minerals) .... Take 1 tab by mouth once daily.Marland KitchenMarland Kitchen 9)  Vitamin D 2000 Unit Tabs (Cholecalciferol) .... Take 1 tablet by mouth once a day 10)  Alprazolam 0.5 Mg Tabs (Alprazolam) .... Take 1/2-1 tablet by mouth three times a day as needed for nerves...  Other Orders: Prescription Created Electronically [phone])  Patient Instructions: 1)  Today we updated your med list- see below.... 2)  We refilled your meds for 2011... 3)  Let me know if your sinus problem requires a CT Scan for further eval... 4)  In the Meanwhile:  use the MUCINEX twice daily; drink plenty of water; use the Saline Nasal Spray every 1-2 H & the FLONASE at bedtime.Marland KitchenMarland Kitchen 5)  Please return to our lab one morning during the week of 07/08/09 for your FASTING blood work... please call the "phone tree" in a few days for your lab results.Marland KitchenMarland Kitchen  6)  Call for any problems... Prescriptions: ALPRAZOLAM 0.5 MG TABS (ALPRAZOLAM) Take 1/2-1 tablet by mouth three times a day as needed for nerves...  #100 x 3   Entered and Authorized by:    Michele Mcalpine MD   Signed by:   Michele Mcalpine MD on 06/29/2009   Method used:   Print then Give to Patient   RxID:   [id number] MELOXICAM 15 MG  TABS (MELOXICAM) 1 tab by mouth once daily as needed for arthritis pain...  #90 x 3   Entered and Authorized by:   Michele Mcalpine MD   Signed by:   Lorin Picket  Elayne Snare MD on 06/29/2009   Method used:   Print then Give to Patient   RxID:   [id number] LISINOPRIL 10 MG  TABS (LISINOPRIL) Take 1 tablet by mouth once a day  #90 x 3   Entered and Authorized by:   Michele Mcalpine MD   Signed by:   Michele Mcalpine MD on 06/29/2009   Method used:   Print then Give to Patient   RxID:   [id number] VERAPAMIL HCL CR 180 MG  TBCR (VERAPAMIL HCL) 1 tab by mouth once daily for BP...  #90 x 3   Entered and Authorized by:   Michele Mcalpine MD   Signed by:   Michele Mcalpine MD on 06/29/2009   Method used:   Print then Give to Patient   RxID:   [id number] FLONASE 50 MCG/ACT SUSP (FLUTICASONE PROPIONATE) use 2 sprays in each nostril at bedtime  #3 x 3   Entered and Authorized by:   Michele Mcalpine MD   Signed by:   Michele Mcalpine MD on 06/29/2009   Method used:   Print then Give to Patient   RxID:   [id number]

## 2010-08-30 ENCOUNTER — Telehealth (INDEPENDENT_AMBULATORY_CARE_PROVIDER_SITE_OTHER): Payer: Self-pay | Admitting: *Deleted

## 2010-09-07 NOTE — Progress Notes (Signed)
Summary: Alprazolam Refill request  Phone Note Refill Request Message from:  Fax from Pharmacy on August 30, 2010 10:17 AM  Refills Requested: Medication #1:  ALPRAZOLAM 0.5 MG TABS Take 1/2-1 tablet by mouth three times a day as needed for nerves...   Dosage confirmed as above?Dosage Confirmed   Brand Name Necessary? No   Supply Requested: 1 month   Last Refilled: 07/16/2010   Notes: [facility] [place]  (p[facility]  [phone], (f)  [phone]   Method Requested: Telephone to Pharmacy Next Appointment Scheduled: none pending Initial call taken by: Michel Bickers CMA,  August 30, 2010 10:18 AM  Follow-up for Phone Call        Please advise if okay for reill.Michel Bickers CMA  August 30, 2010 10:19 AM   denied pt will need ov with SN for refills.  thanks Randell Loop CMA  August 30, 2010 12:57 PM   Additional Follow-up for Phone Call Additional follow up Details #1::        Rx denial called/faxed to pharmacy Abigail Miyamoto RN  August 31, 2010 9:12 AM

## 2010-09-10 ENCOUNTER — Telehealth: Payer: Self-pay | Admitting: Pulmonary Disease

## 2010-09-14 NOTE — Progress Notes (Signed)
Summary: alprazolam refill  Phone Note Call from Patient Call back at Home Phone [phone]   Caller: Patient Call For: Kenzi Bardwell Summary of Call: pt has an appt pend for 11/11/10. wants a refill of alprazolam in the meantime- says she only has 4 remaining[facility] [place] Initial call taken by: Tivis Ringer, CNA,  September 10, 2010 2:01 PM  Follow-up for Phone Call        Pt has penidng apt for 11/11/10 and is wanting a refill on her alprazolam. Dr. Kriste Basque Please advise. Thanks Carver Fila  September 10, 2010 2:36 PM    per SN---ok for refill and this has been called to her pharmacy and pt is aware Randell Loop CMA  September 10, 2010 3:30 PM     Prescriptions: ALPRAZOLAM 0.5 MG TABS (ALPRAZOLAM) Take 1/2-1 tablet by mouth three times a day as needed for nerves...  #90 x 2   Entered by:   Randell Loop CMA   Authorized by:   Michele Mcalpine MD   Signed by:   Randell Loop CMA on 09/10/2010   Method used:   [facility] to ...       [facility]  [address] [phone]* (retail)       [address]       [place], [place]  [address]       Ph: [phone] or [phone]       Fax: [phone]   RxID:   [id number]

## 2010-11-11 ENCOUNTER — Ambulatory Visit: Payer: Self-pay | Admitting: Pulmonary Disease

## 2010-11-12 NOTE — Op Note (Signed)
NAMEFERRIS, Alison               ACCOUNT NO.:  [id number]   MEDICAL RECORD NO.:  [id number]          PATIENT TYPE:  OIB   LOCATION:  2899                         FACILITY:  [facility]   PHYSICIAN:  Kathaleen Maser. Pool, M.D.    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   PROCEDURE:  C3-4, C4-5 and C5-6 anterior cervical diskectomy and fusion with  allograft anterior plating.   SURGEON:  Kathaleen Maser. Pool, M.D.   ASSISTANT:  Reinaldo Meeker, M.D.   ANESTHESIA:  General oroendotracheal.   INDICATIONS FOR PROCEDURE:  The patient is a 65 year old woman with a  history of neck and bilateral upper extremity symptoms, consistent with an  early cervical myelopathy.  Workup has demonstrated evidence of advanced  spondylosis with evidence of instability at C3-4, C5-6 and C5-6, with some  coexisting cervical stenosis and early spinal cord compression.  The patient  has been counseled as to her options.  She has decided to proceed with a C3-  4, C4-5 and C5-6 anterior cervical diskectomy and fusion with allograft  anterior plating for the hopeful improvement in her symptoms.   DESCRIPTION OF PROCEDURE:  The patient is brought to the operating room and  placed on the operating room table in the supine position.  After an  adequate level of anesthesia was achieved, the patient supine, with her neck  slightly extended and held in place with Holter traction.  The anterior  cervical region was prepped and draped sterilely.  A #10 blade is used to  make a linear skin incision overlying the C5 vertebral body.  This is  carried down sharply to the platysma.  The platysma is then divided  vertically and dissection proceeds along the medial  border of the  sternocleidomastoid muscle and the carotid sheath.  The trachea and  esophagus are mobilized and retracted towards the left.  The prevertebral  fascia is stripped off the anterior spinal column.  The longus coli muscle  is then elevated bilaterally using electrocautery.  Deep self-retaining  retractors are placed.  Intraoperative fluoroscopy is used, and the C3-4, C4-  5 and C5-6 levels were confirmed.  The disk spaces at all three levels are  then incised with the #15 blade in a rectangular fashion.  A wide disk space  cleanout was then achieved using pituitary rongeurs, forward and backward  Carlen curets, Kerrison rongeurs and the high-speed drill.  All elements of  the disk were moved down to the level of the posterior annulus.  Starting  first at C3-4, the microscope was brought onto the field and used during the  remainder of the dissection.  The remaining aspects of the annulus and  osteophytes were removed using the high-speed drill down to the level of the  posterior longitudinal ligament.  The posterior longitudinal ligament was  then  elevated and resected in a piece-meal fashion using Kerrison rongeurs.  The underlying thecal sac was then identified.  Wide separating compression  was then performed undercutting the bodies of the C3 and C4.  Decompression  then proceeded at each neural foramen.  Wide anterior foraminotomies were  performed along the course of the exiting nerve roots.  A blunt probe was  passed easily both superiorly and inferiorly at each foramen.  The procedure  was then repeated at C4-5 and C5-6, again without complications.  Gelfoam  was placed topically for hemostasis.  A 7 mm patellar wedge allograft was  then impacted into place and recessed approximately 1 mm from the anterior  cortical margin at C3-4.  A 6 mm graft was placed at C5-6 and C4-5.  An  Atlantis anterior cervical plate was then placed over the C3, C4, C5 and C6  levels. This was  then attached under fluoroscopic guidance using 13 mm  variable angle screws.  All eight screws were given a final tightening, and  found to be followed in bone.  The locking screws were engaged at all four  levels.  Final images revealed good positioning of the bone grafts and  hardware at the proper operating levels, normal on the spine.  The wounds  were then irrigated with antibiotic solution.  Gelfoam was placed for  operative hemostasis and found to be good.  The wound is then irrigated one  final time and then closed in the typical fashion.  Steri-Strips and a  sterile dressing were applied.  There were no operative complications.  The patient tolerated the surgery well and returns to the recovery room  postoperatively.       HAP/MEDQ  D:  04/13/2004  T:  04/13/2004  Job:  [id number]

## 2010-11-12 NOTE — Assessment & Plan Note (Signed)
[facility]                         GASTROENTEROLOGY OFFICE NOTE   NAME:Cuevas, Alison                        MRN:          [id number]  DATE:06/26/2006                            DOB:          1945-12-20    Tiani has had rather remarkable improvement with Xifaxan therapy for  presumed bacterial overgrowth related to her previous right  hemicolectomy.  Her colonoscopy otherwise on May 24, 2006, was  unremarkable.  She additionally has been taking probiotic therapy.  She  continues to complain of some pruritus around her rectum but otherwise  is free of GI complaints.   Her weight is 170 pounds and blood pressure 112/80.  Pulse was 60 and  regular.  Abdominal exam was entirely unremarkable.  Inspection of the  rectum showed some chronic depigmentation and atrophy of the skin around  her perirectal area with some small external hemorrhoids.  There were no  definite fissures or fistulae.   RECOMMENDATIONS:  1. Twice-a-day cleansing of her perirectal area with Balneol solution      followed by zinc oxide paste twice a day.  2. Daily Activa yogurt therapy.  3. She may need p.r.n. Xifaxan therapy for bacterial overgrowth      syndrome, depending on clinical course.     Vania Rea. Jarold Motto, MD, Caleen Essex, FAGA  Electronically Signed    DRP/MedQ  DD: 06/26/2006  DT: 06/26/2006  Job #: [id number]

## 2010-11-12 NOTE — H&P (Signed)
[name], Cuevas NO.:  [id number]   MEDICAL RECORD NO.:  [id number]          PATIENT TYPE:  EMS   LOCATION:  ED                           FACILITY:  [facility]   PHYSICIAN:  Tia Alert, MD     DATE OF BIRTH:  01/11/1946   DATE OF ADMISSION:  04/17/2004  DATE OF DISCHARGE:                                HISTORY & PHYSICAL   CHIEF COMPLAINT:  Dysphagia.   HISTORY OF PRESENT ILLNESS:  This is a 65 year old white female who is  postoperative day #4 status post three-level ACDF with plating by Sherilyn Cooter A.  Pool, M.D., who presented to the [facility] emergency  department complaining of difficulty swallowing liquids and solids.  She had  talked with Donalee Citrin, M.D. several times on the phone over the last couple  of days with difficulty swallowing.  She got to the point that she had  difficulty swallowing liquids and felt dehydrated and therefore came to the  emergency department where she was given IV fluids for hydration.  She does  complain of some hoarseness, but no dyspnea.  She has had no trouble  breathing.  She has had no new numbness, tingling, or weakness that she can  tell.   PAST MEDICAL HISTORY:  Positive for hypertension.   MEDICATIONS:  1.  Toprol XL.  2.  Lotrel.  3.  Vicodin.   ALLERGIES:  No known drug allergies.   PHYSICAL EXAMINATION:  GENERAL:  She is awake, alert and oriented x4.  No  aphasia, good attention span.  Her incision is clean, dry, and intact.  There is no significant swelling.  NECK:  Her trachea is midline.  CHEST:  She breathes without difficulty.  She talks without difficulty,  though, she does have some hoarseness.  She was able to swallow liquids for  me fairly well.  She seems to have good strength throughout.  I did not test  her gait.   ASSESSMENT:  This is a 65 year old white female who is now four days status  post three-level ACDF with plating who has some dysphagia and dehydration.  We are  going to admit her for hydration and IV pain control and allow her  some time.  I do not believe she has a significant clot.  It think this is  probably esophageal dysmotility and swelling.  We will put her on a little  Decadron to see if that helps also and hopefully she will notice some  improvement over the next couple of days.  We will use a soft mechanical  diet with aspiration precautions.  If things do not improve, we may proceed  with a swallowing evaluation and/or a CT scan of the cervical spine to rule  out a significant postoperative hematoma.     DSJ/MEDQ  D:  04/17/2004  T:  04/17/2004  Job:  [id number]

## 2010-11-12 NOTE — Procedures (Signed)
[place]  Patient:    AMEY Eline                         MRN: [id number] Proc. Date: 06/09/99 Adm. Date:  [id number] Attending:  Mardella Layman CC:         Lonzo Cloud. Kriste Basque, M.D. [facility]             Haywood M. Derrell Lolling, M.D.                           Procedure Report  BRIEF HISTORY:  Ms. Mendell is a 65 year old white female that I have seen for  many years because of recurrent intestinal adhesions and chronic abdominal pain. She had right-sided diverticulitis requiring right hemicolectomy in 1993, also  previous cholecystectomy.  Additional problems included chronic abdominal pain rom abdominal adhesions with gas and bloating.  Last colonoscopy was in January, 1994. She currently has been doing well, but continues with chronic low-grade abdominal pain.  It was felt that a repeat colonoscopy was indicated.  She also has chronic dyspepsia and questionable history of intermittent dysphagia, and it was felt that an endoscopy was also indicated.  INFORMED CONSENT:  The risks and benefits of these procedures were explained in  detail.  She agreed to proceed as planned.  Preoperative cardiopulmonary and mental status exams were unremarkable.  PROCEDURE:  Colonoscopy.  DESCRIPTION OF PROCEDURE:  Throughout this procedure, the patient was on pulse oximetry and cardiac monitoring.  She tolerated this procedure well, receiving supplemental low-flow oxygen by nasal cannula throughout the procedure.  She was anesthetized with 100 mcg of IV fentanyl and 5 mg of IV Versed.  Inspection of he rectum was unremarkable, as was the rectal exam.  Her rectum was intubated with the pediatric variable stiffness colonoscope.  There was a very redundant and tortuous colon with some fixation of the sigmoid colon from adhesions making this exam very difficult.  However, once we passed the splenic flexure, was able to advance rather rapidly into the right  mid abdomen, where there appeared to be an end-to-side anastomosis which appeared widely patent and entirely normal.  Picture obtained by documentation.  The colonoscope was then slowly withdrawn throughout the length of the colon, which otherwise, was free of any significant mucosal or polypoid lesions.  She tolerated this procedure well and was extubated without difficulty.  PROCEDURE:  Endoscopy.  DESCRIPTION OF PROCEDURE:  The patient was continued on pulse oximetry and cardiac monitoring with supplemental low-flow oxygen administration.  She was given additional Cetacaine spray of the oropharynx.  She was intubated using the Olympus adult video endoscope.  The vocal cords and hypopharynx appeared normal.  The esophagus throughout it length was unremarkable without any mucosal or polypoid  lesions.  I could see no esophagitis, hiatal hernia, or esophageal stricture. he endoscope easily passed into the stomach.  There was some slightly erythema scattered throughout the stomach, but generally, the mucosal pattern of the fundus, body, antrum, and pylorus of the stomach was normal, as was the duodenal bulb and duodenal sweep.  Retroflexed view of the fundus and cardia of the stomach, otherwise was unremarkable.  She was extubated without difficulty and returned n stable condition to the recovery room for observation.  ASSESSMENT: 1. Status post right hemicolectomy for right-sided diverticulitis in 1994. 2. Chronic abdominal pain secondary to intestinal adhesions with underlying    irritable bowel syndrome.  3. Negative upper GI panendoscopy. 4. Status post cholecystectomy.  RECOMMENDATIONS: 1. Consider glucose-hydrogen breath testing to exclude bacterial overgrowth    syndrome. 2. Chronic IBS regimen. 3. Office followup as needed. DD:  06/09/99 TD:  06/10/99 Job: [id number]

## 2010-11-16 ENCOUNTER — Ambulatory Visit (INDEPENDENT_AMBULATORY_CARE_PROVIDER_SITE_OTHER): Payer: Medicare Other | Admitting: Pulmonary Disease

## 2010-11-16 ENCOUNTER — Other Ambulatory Visit (INDEPENDENT_AMBULATORY_CARE_PROVIDER_SITE_OTHER): Payer: Medicare Other

## 2010-11-16 ENCOUNTER — Other Ambulatory Visit (INDEPENDENT_AMBULATORY_CARE_PROVIDER_SITE_OTHER): Payer: Medicare Other | Admitting: Pulmonary Disease

## 2010-11-16 ENCOUNTER — Encounter: Payer: Self-pay | Admitting: Pulmonary Disease

## 2010-11-16 DIAGNOSIS — M1990 Unspecified osteoarthritis, unspecified site: Secondary | ICD-10-CM

## 2010-11-16 DIAGNOSIS — K219 Gastro-esophageal reflux disease without esophagitis: Secondary | ICD-10-CM

## 2010-11-16 DIAGNOSIS — I10 Essential (primary) hypertension: Secondary | ICD-10-CM

## 2010-11-16 DIAGNOSIS — Z23 Encounter for immunization: Secondary | ICD-10-CM

## 2010-11-16 DIAGNOSIS — R300 Dysuria: Secondary | ICD-10-CM

## 2010-11-16 DIAGNOSIS — K5732 Diverticulitis of large intestine without perforation or abscess without bleeding: Secondary | ICD-10-CM

## 2010-11-16 DIAGNOSIS — E7800 Pure hypercholesterolemia, unspecified: Secondary | ICD-10-CM

## 2010-11-16 DIAGNOSIS — F411 Generalized anxiety disorder: Secondary | ICD-10-CM

## 2010-11-16 DIAGNOSIS — N390 Urinary tract infection, site not specified: Secondary | ICD-10-CM

## 2010-11-16 LAB — CBC WITH DIFFERENTIAL/PLATELET
Basophils Absolute: 0 10*3/uL (ref 0.0–0.1)
Basophils Relative: 0.6 % (ref 0.0–3.0)
Eosinophils Absolute: 0.2 10*3/uL (ref 0.0–0.7)
Eosinophils Relative: 3.2 % (ref 0.0–5.0)
HCT: 38.2 % (ref 36.0–46.0)
Hemoglobin: 13.4 g/dL (ref 12.0–15.0)
Lymphocytes Relative: 49.1 % — ABNORMAL HIGH (ref 12.0–46.0)
Lymphs Abs: 2.6 10*3/uL (ref 0.7–4.0)
MCHC: 35 g/dL (ref 30.0–36.0)
MCV: 89.4 fl (ref 78.0–100.0)
Monocytes Absolute: 0.4 10*3/uL (ref 0.1–1.0)
Monocytes Relative: 8.5 % (ref 3.0–12.0)
Neutro Abs: 2 10*3/uL (ref 1.4–7.7)
Neutrophils Relative %: 38.6 % — ABNORMAL LOW (ref 43.0–77.0)
Platelets: 160 10*3/uL (ref 150.0–400.0)
RBC: 4.28 Mil/uL (ref 3.87–5.11)
RDW: 13.1 % (ref 11.5–14.6)
WBC: 5.2 10*3/uL (ref 4.5–10.5)

## 2010-11-16 LAB — URINALYSIS, ROUTINE W REFLEX MICROSCOPIC
Bilirubin Urine: NEGATIVE
Hgb urine dipstick: NEGATIVE
Ketones, ur: NEGATIVE
Nitrite: NEGATIVE
Specific Gravity, Urine: 1.015 (ref 1.000–1.030)
Total Protein, Urine: NEGATIVE
Urine Glucose: NEGATIVE
Urobilinogen, UA: 0.2 (ref 0.0–1.0)
pH: 6 (ref 5.0–8.0)

## 2010-11-16 LAB — BASIC METABOLIC PANEL
BUN: 16 mg/dL (ref 6–23)
CO2: 27 mEq/L (ref 19–32)
Calcium: 9.5 mg/dL (ref 8.4–10.5)
Chloride: 106 mEq/L (ref 96–112)
Creatinine, Ser: 0.9 mg/dL (ref 0.4–1.2)
GFR: 69.21 mL/min (ref >60.00)
Glucose, Bld: 100 mg/dL — ABNORMAL HIGH (ref 70–99)
Potassium: 4.3 mEq/L (ref 3.5–5.1)
Sodium: 140 mEq/L (ref 135–145)

## 2010-11-16 LAB — LIPID PANEL
Cholesterol: 168 mg/dL (ref 0–200)
HDL: 48.2 mg/dL (ref >39.00)
LDL Cholesterol: 106 mg/dL — ABNORMAL HIGH (ref 0–99)
Total CHOL/HDL Ratio: 3
Triglycerides: 70 mg/dL (ref 0.0–149.0)
VLDL: 14 mg/dL (ref 0.0–40.0)

## 2010-11-16 LAB — HEPATIC FUNCTION PANEL
ALT: 22 U/L (ref 0–35)
AST: 28 U/L (ref 0–37)
Albumin: 3.9 g/dL (ref 3.5–5.2)
Alkaline Phosphatase: 57 U/L (ref 39–117)
Bilirubin, Direct: 0.1 mg/dL (ref 0.0–0.3)
Total Bilirubin: 0.9 mg/dL (ref 0.3–1.2)
Total Protein: 6.8 g/dL (ref 6.0–8.3)

## 2010-11-16 LAB — TSH: TSH: 1.37 u[IU]/mL (ref 0.35–5.50)

## 2010-11-16 MED ORDER — PNEUMOCOCCAL VAC POLYVALENT 25 MCG/0.5ML IJ INJ
0.5000 mL | INJECTION | Freq: Once | INTRAMUSCULAR | Status: AC
  Administered 2010-11-16: 0.5 mL via INTRAMUSCULAR

## 2010-11-16 NOTE — Progress Notes (Signed)
Subjective:    Patient ID: Alison Cuevas, female    DOB: 09-03-1945, 65 y.o.   MRN: [id number]  HPI 65 y/o WF here for a follow up office visit... he has multiple medical problems as noted below...   ~  September 15, 2008:  she had a gastroenteritis ~3wks ago w/ nausea, vomiting, gas, diarrhea- all improved now... she is due for CXR, EKG, fasting blood work, and needs refill perscriptions for 2010...  ~  June 29, 2009:  generally stable but under alot of stress- Alison Cuevas helps... she is concerned about sinuses & saw [facility] who thought her symptoms were stress related and didn't do anything... offered her CT Sinus but she wants to wait, and in the meanwhile she will Rx w/ Saline, Mucinex, Flonase... BP is controlled on Rx;  on diet alone for Chol & wt up 3# this yr;  persist prob w/ pelvic prolapse Rx by DrFontaine recently & he has BMD scheduled as well...  she will ret for FASTING labs soon>>  ~  Nov 16, 2010:  72mo ROV & her CC remains her LBP w/ eval & rx by DrPool, she says she needs rods etc but she is holding off;  She & Alison Cuevas") just celebrated their 50th anniversary;  She saw TP 11/11 & had meds refilled> note reviewed, given Pneumovax;  See prob list below & fasting labs>>          Problem List:   ALLERGY (ICD-995.3) - she has allergic rhinitis symptoms and increased difficulty w/ the warmer weather... she's been on LORATIDINE 10mg /d, plus saline nasal spray during the day, and FLONASE in each nostril Qhs... she uses Mucinex OTC as needed and had DrCNewman wash out her ears... ~  f/u eval DrCNewman 12/10 for "crazy feelin in my head"- rec continued Saline lavage, Flonase, etc...  HYPERTENSION (ICD-401.9) - controlled on VERAPAMIL SR 180mg /d + LISINOPRIL 10mg /d...  ~  1/11:  BP=128/70 and similar at home; denies HA, visual changes, CP, palipit, dizziness, syncope, dyspnea, edema, etc... both meds are on the [facility] $4 list and she is pleased w/ the results... ~  5/12:  BP= 112/74 & she  remains asymptomatic...  HYPERCHOLESTEROLEMIA (ICD-272.0) - on diet alone... ~  FLP 3/09 showed Tchol 202, Tg 56, HDL 44, LDL 137... rec- diet efforts & get wt down... ~  FLP 9/09 showed TChol 183, TG 78, HDL 37, LDL 131... ~  FLP 3/10 (wt=146#) showed TChol 169, TG 47, HDL 51, LDL 108... improved on diet Rx... ~  FLP 1/11 (wt=149#) showed TChol 172, TG 66, HDL 46, LDL 113 ~  FLP 5/12 (wt=156#) showed TChol 168, TG 70, HDL 48, LDL 106   GERD (ICD-530.81) - mild reflux symptoms.. she uses OTC Prilosec as needed.  Hx of DIVERTICULITIS OF COLON (ICD-562.11) & IBS - she uses MIRALAX daily...s/p right hemicolectomy 10/93 by Lurline Hare... last colonoscopy was 11/07 by DrPatterson showing normal left colon... she's had adhesions and recurrent abd pain due to IBS- eval by GI/ DrPatterson...  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 ~  7/10: eval by drMacDiarmid for Urology- he rec watchful waiting at this time... ~  12/10: she had f/u DrFontaine for GYN- he plans BMD as well... ~  We don't have any of this data> BMD per DrFontaine/ GYN; GU per DrMacDiarmid & he rec holding off on surg if it isn't bothering her...  DEGENERATIVE JOINT DISEASE (ICD-715.90)  NECK & BACK PAIN - she requests MOBIC 15mg /d as this helped her relative and is on the [facility] list too... she has been followed by DrPool for neurosurgery- she had a 3 level anterior cervical diskectomy and fusion w/ plating 10/03... 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 She says DrPool has indicated that she needs surg w/ lumbar rods etc but she is holding off...  ANXIETY (ICD-300.00) - on ALPRAZOLAM 0.5mg  as needed... she was anxious about AAA in one of her parents- we did an AbdSonar 3/09 that was neg.  Health Maintenance - GYN= DrFontaine for PAP, BMD, and Mammograms at [facility]... she takes Calcium, women's MVI, Vit D...  She had PNEUMOVAX 11/11 at age 59.   Past Surgical History  Procedure Date  . Cspine surgery 2003    Dr. Estelle Grumbles level ant cerv diskectomy /fusion w/plating  . Right hemicolectomy for diverticulitis with abscess 1993  . Cholecystectomy 1997  . Abdominal hysterectomy     Outpatient Encounter Prescriptions as of 11/16/2010  Medication Sig Dispense Refill  . ALPRAZolam (XANAX) 0.5 MG tablet Take 1/2 to 1 tablet by mouth three times a day as needed for nerves       . Calcium Carbonate-Vitamin D (CALTRATE 600+D) 600-400 MG-UNIT per tablet Take 1 tablet by mouth daily.        . Cholecalciferol (VITAMIN D) 2000 UNITS CAPS Take 1 capsule by mouth daily.        . fish oil-omega-3 fatty acids 1000 MG capsule Take 2 g by mouth daily.        . fluticasone (FLONASE) 50 MCG/ACT nasal spray 2 sprays by Nasal route at bedtime as needed.        Marland Kitchen guaiFENesin (MUCINEX) 600 MG 12 hr tablet Take 1,200 mg by mouth 2 (two) times daily.        Marland Kitchen lisinopril (PRINIVIL,ZESTRIL) 10 MG tablet Take 10 mg by mouth daily.        . meloxicam (MOBIC) 15 MG tablet Take 15 mg by mouth daily. As needed for arthritis pain       . Multiple Vitamins-Minerals (WOMENS MULTIVITAMIN PLUS PO) Take 1 tablet by mouth daily.        . polyethylene glycol powder (GLYCOLAX/MIRALAX) powder Take 17 g by mouth daily as needed.        . verapamil (COVERA HS) 180 MG (CO) 24 hr tablet Take 180 mg by mouth at bedtime. For blood pressure       . vitamin B-12 (CYANOCOBALAMIN) 1000 MCG tablet Take 1,000 mcg by mouth daily.          Allergies  Allergen Reactions  . Prednisone     REACTION: funny feeling    Review of Systems        See HPI - all other systems neg except as noted... The patient complains of headaches.  The patient denies anorexia, fever, weight loss, weight gain, vision loss, decreased hearing, hoarseness, chest pain, syncope, dyspnea on exertion, peripheral edema, prolonged cough, hemoptysis, abdominal pain, melena,  hematochezia, severe indigestion/heartburn, hematuria, incontinence, muscle weakness, suspicious skin lesions, transient blindness, difficulty walking, depression, unusual weight change, abnormal bleeding, enlarged lymph nodes, and angioedema.     Objective:   Physical Exam     WD, WN, 65 y/o WF in NAD... GENERAL:  Alert & oriented; pleasant & cooperative... HEENT:  [place]/AT, EOM-wnl, PERRLA, EACs- min wax, NOSE- neg, THROAT- clear & wnl. NECK:  Supple w/ decrROM & ant cx scar;  no JVD; normal carotid impulses w/o bruits; no thyromegaly or nodules palpated; no lymphadenopathy. CHEST:  Clear to P & A; without wheezes/ rales/ or rhonchi heard... HEART:  Regular Rhythm; without murmurs/ rubs/ or gallops detected... ABDOMEN:  Soft & nontender; normal bowel sounds; no organomegaly or masses detected. EXT: without deformities, mild arthritic changes; no varicose veins/ +venous insuffic/ no edema. NEURO:  CN's intact; no focal neuro deficits; normal DTR's... DERM:  No lesions noted; no rash etc...   Assessment & Plan:   HBP>  Controlled on meds, continue same...  HYPERLIPID>  Stable on diet alone, rec to incr exercise as well...  GI>  GERD, Divertics, IBS>  Stable on Omep + Miralax, etc...  GU>  Pelvic organ prolapse>  eval & management by DrFontaine for GYN & DrMacDiarmid for Urology...  DJD/ Neck & Back Pain>  Managed by DrPool for NS & she is holding off on surg...  ANXIETY>  Stable on Alpraz as needed.Marland KitchenMarland Kitchen

## 2010-11-16 NOTE — Patient Instructions (Signed)
Today we updated your med list in EPIC...    Continue your current meds the same...  Today we did your follow up fasting blood work...    Please call the PHONE TREE in a few days for your results...    Dial [id number] & when prompted enter your patient number followed by the # symbol...    Your patient number is:  [id number]  Stay as active as possible, and keep your weight down... Call for any questions... Let's plan a follow up visit in 6 months, sooner if needed for problems.Marland KitchenMarland Kitchen

## 2010-11-17 ENCOUNTER — Encounter: Payer: Self-pay | Admitting: Pulmonary Disease

## 2011-03-01 ENCOUNTER — Other Ambulatory Visit: Payer: Self-pay | Admitting: *Deleted

## 2011-03-01 MED ORDER — ALPRAZOLAM 0.5 MG PO TABS: 0.5000 mg | ORAL_TABLET | Freq: Three times a day (TID) | ORAL | Status: DC | PRN

## 2011-03-10 ENCOUNTER — Other Ambulatory Visit: Payer: Self-pay | Admitting: Pulmonary Disease

## 2011-04-05 ENCOUNTER — Ambulatory Visit (INDEPENDENT_AMBULATORY_CARE_PROVIDER_SITE_OTHER): Payer: Medicare Other | Admitting: Adult Health

## 2011-04-05 ENCOUNTER — Encounter: Payer: Self-pay | Admitting: Adult Health

## 2011-04-05 ENCOUNTER — Other Ambulatory Visit (INDEPENDENT_AMBULATORY_CARE_PROVIDER_SITE_OTHER): Payer: Medicare Other

## 2011-04-05 DIAGNOSIS — D649 Anemia, unspecified: Secondary | ICD-10-CM

## 2011-04-05 DIAGNOSIS — I10 Essential (primary) hypertension: Secondary | ICD-10-CM

## 2011-04-05 DIAGNOSIS — IMO0001 Reserved for inherently not codable concepts without codable children: Secondary | ICD-10-CM

## 2011-04-05 DIAGNOSIS — R5381 Other malaise: Secondary | ICD-10-CM

## 2011-04-05 DIAGNOSIS — M791 Myalgia, unspecified site: Secondary | ICD-10-CM

## 2011-04-05 DIAGNOSIS — R5383 Other fatigue: Secondary | ICD-10-CM

## 2011-04-05 DIAGNOSIS — K5732 Diverticulitis of large intestine without perforation or abscess without bleeding: Secondary | ICD-10-CM

## 2011-04-05 DIAGNOSIS — E538 Deficiency of other specified B group vitamins: Secondary | ICD-10-CM

## 2011-04-05 DIAGNOSIS — F411 Generalized anxiety disorder: Secondary | ICD-10-CM

## 2011-04-05 DIAGNOSIS — R531 Weakness: Secondary | ICD-10-CM

## 2011-04-05 LAB — CBC WITH DIFFERENTIAL/PLATELET
Basophils Absolute: 0 10*3/uL (ref 0.0–0.1)
Basophils Relative: 0.4 % (ref 0.0–3.0)
Eosinophils Absolute: 0.1 10*3/uL (ref 0.0–0.7)
Eosinophils Relative: 1.9 % (ref 0.0–5.0)
HCT: 39.8 % (ref 36.0–46.0)
Hemoglobin: 13.4 g/dL (ref 12.0–15.0)
Lymphocytes Relative: 43.5 % (ref 12.0–46.0)
Lymphs Abs: 2 10*3/uL (ref 0.7–4.0)
MCHC: 33.8 g/dL (ref 30.0–36.0)
MCV: 89.3 fl (ref 78.0–100.0)
Monocytes Absolute: 0.3 10*3/uL (ref 0.1–1.0)
Monocytes Relative: 7 % (ref 3.0–12.0)
Neutro Abs: 2.2 10*3/uL (ref 1.4–7.7)
Neutrophils Relative %: 47.2 % (ref 43.0–77.0)
Platelets: 170 10*3/uL (ref 150.0–400.0)
RBC: 4.45 Mil/uL (ref 3.87–5.11)
RDW: 13.3 % (ref 11.5–14.6)
WBC: 4.7 10*3/uL (ref 4.5–10.5)

## 2011-04-05 LAB — CK TOTAL AND CKMB (NOT AT ARMC)
CK, MB: 3.6 ng/mL (ref 0.3–4.0)
Total CK: 81 U/L (ref 7–177)

## 2011-04-05 LAB — SEDIMENTATION RATE: Sed Rate: 16 mm/hr (ref 0–22)

## 2011-04-05 LAB — RHEUMATOID FACTOR: Rhuematoid fact SerPl-aCnc: <10 IU/mL (ref <=14)

## 2011-04-05 NOTE — Assessment & Plan Note (Signed)
Flare with associated myaglia ? Fibromyalgia w/ fatigue  Labs pending w/ b12 , iron panel, cbc, esr and RA factor   Plan;

## 2011-04-05 NOTE — Progress Notes (Signed)
Subjective:    Patient ID: Alison Cuevas, female    DOB: 29-Oct-1945, 65 y.o.   MRN: [id number]  HPI  65 y/o WF    ~  September 15, 2008:  she had a gastroenteritis ~3wks ago w/ nausea, vomiting, gas, diarrhea- all improved now... she is due for CXR, EKG, fasting blood work, and needs refill perscriptions for 2010...  ~  June 29, 2009:  generally stable but under alot of stress- [name] helps... she is concerned about sinuses & saw [facility] who thought her symptoms were stress related and didn't do anything... offered her CT Sinus but she wants to wait, and in the meanwhile she will Rx w/ Saline, Mucinex, Flonase... BP is controlled on Rx;  on diet alone for Chol & wt up 3# this yr;  persist prob w/ pelvic prolapse Rx by DrFontaine recently & he has BMD scheduled as well...  she will ret for FASTING labs soon>>  ~  Nov 16, 2010:  68mo ROV & her CC remains her LBP w/ eval & rx by DrPool, she says she needs rods etc but she is holding off;  She & [name]") just celebrated their 50th anniversary;  She saw TP 11/11 & had meds refilled> note reviewed, given Pneumovax;  See prob list below & fasting labs>>  04/05/2011 Acute OV  Pt complains that over last few months she has noticed that she has no energy, feels tired all the time. Increased over last 2 weeks . Has more aches than her usual . Feels sleepy a lot.  Has trouble sleepy, soreness in muscles -burns at times, anxious, worries a lot, trouble going and staying asleep. "feel like my Fibromyalgia " is acting up. Has chronic back pain which has been worse than usual.  No recent travel , weight loss , bloody stools, chest pain or dyspnea.          Problem List:   ALLERGY (ICD-995.3) - she has allergic rhinitis symptoms and increased difficulty w/ the warmer weather... she's been on LORATIDINE 10mg /d, plus saline nasal spray during the day, and FLONASE in each nostril Qhs... she uses Mucinex OTC as needed and had DrCNewman wash out her ears... ~   f/u eval DrCNewman 12/10 for "crazy feelin in my head"- rec continued Saline lavage, Flonase, etc...  HYPERTENSION (ICD-401.9) - controlled on VERAPAMIL SR 180mg /d + LISINOPRIL 10mg /d...  ~  1/11:  BP=128/70 and similar at home; denies HA, visual changes, CP, palipit, dizziness, syncope, dyspnea, edema, etc... both meds are on the [facility] $4 list and she is pleased w/ the results... ~  5/12:  BP= 112/74 & she remains asymptomatic...  HYPERCHOLESTEROLEMIA (ICD-272.0) - on diet alone... ~  FLP 3/09 showed Tchol 202, Tg 56, HDL 44, LDL 137... rec- diet efforts & get wt down... ~  FLP 9/09 showed TChol 183, TG 78, HDL 37, LDL 131... ~  FLP 3/10 (wt=146#) showed TChol 169, TG 47, HDL 51, LDL 108... improved on diet Rx... ~  FLP 1/11 (wt=149#) showed TChol 172, TG 66, HDL 46, LDL 113 ~  FLP 5/12 (wt=156#) showed TChol 168, TG 70, HDL 48, LDL 106   GERD (ICD-530.81) - mild reflux symptoms.. she uses OTC Prilosec as needed.  Hx of DIVERTICULITIS OF COLON (ICD-562.11) & IBS - she uses MIRALAX daily...s/p right hemicolectomy 10/93 by Lurline Hare... last colonoscopy was 11/07 by DrPatterson showing normal left colon... she's had adhesions and recurrent abd pain due to IBS- eval by GI/ DrPatterson...  Pelvic Organ  Prolapse w/ urinary stress incontinence & fecal incontinence - eval 12/08 by Delaney Meigs [facility] (referred by DrFontaine)... ~  7/10: eval by drMacDiarmid for Urology- he rec watchful waiting at this time... ~  12/10: she had f/u DrFontaine for GYN- he plans BMD as well... ~  We don't have any of this data> BMD per DrFontaine/ GYN; GU per DrMacDiarmid & he rec holding off on surg if it isn't bothering her...  DEGENERATIVE JOINT DISEASE (ICD-715.90)  NECK & BACK PAIN - she requests MOBIC 15mg /d as this helped her relative and is on the [facility] list too... she has been followed by DrPool for neurosurgery- she had a 3 level anterior cervical diskectomy and fusion w/ plating 10/03... known lumbar disc disease w/  spinal stenosis (diffuse spinal disease)... She says DrPool has indicated that she needs surg w/ lumbar rods etc but she is holding off...  ANXIETY (ICD-300.00) - on ALPRAZOLAM 0.5mg  as needed... she was anxious about AAA in one of her parents- we did an AbdSonar 3/09 that was neg.  Health Maintenance - GYN= DrFontaine for PAP, BMD, and Mammograms at [facility]... she takes Calcium, women's MVI, Vit D... She had PNEUMOVAX 11/11 at age 16.   Past Surgical History  Procedure Date  . Cspine surgery 2003    Dr. Estelle Grumbles level ant cerv diskectomy /fusion w/plating  . Right hemicolectomy for diverticulitis with abscess 1993  . Cholecystectomy 1997  . Abdominal hysterectomy     Outpatient Encounter Prescriptions as of 04/05/2011  Medication Sig Dispense Refill  . ALPRAZolam (XANAX) 0.5 MG tablet Take 1 tablet (0.5 mg total) by mouth 3 (three) times daily as needed for sleep. Take 1/2 to 1 tablet by mouth three times a day as needed for nerves  90 tablet  2  . B Complex-C (SUPER B COMPLEX PO) Take by mouth daily.        . Calcium Carbonate-Vitamin D (CALTRATE 600+D) 600-400 MG-UNIT per tablet Take 1 tablet by mouth daily.        . Cholecalciferol (VITAMIN D) 2000 UNITS CAPS Take 1 capsule by mouth daily.        . fish oil-omega-3 fatty acids 1000 MG capsule Take 2 g by mouth daily.        . fluticasone (FLONASE) 50 MCG/ACT nasal spray 2 sprays by Nasal route at bedtime as needed.        Marland Kitchen guaiFENesin (MUCINEX) 600 MG 12 hr tablet Take 1,200 mg by mouth 2 (two) times daily.        Marland Kitchen lisinopril (PRINIVIL,ZESTRIL) 10 MG tablet Take 10 mg by mouth daily.        . meloxicam (MOBIC) 15 MG tablet Take 15 mg by mouth daily. As needed for arthritis pain       . Multiple Vitamins-Minerals (WOMENS MULTIVITAMIN PLUS PO) Take 1 tablet by mouth daily.        . polyethylene glycol powder (GLYCOLAX/MIRALAX) powder Take 17 g by mouth daily as needed.        . verapamil (CALAN-SR) 180 MG CR tablet TAKE 1 TABLET  EVERY DAY FOR BP  90 tablet  1  . verapamil (COVERA HS) 180 MG (CO) 24 hr tablet Take 180 mg by mouth at bedtime. For blood pressure       . vitamin B-12 (CYANOCOBALAMIN) 1000 MCG tablet Take 1,000 mcg by mouth daily.          Allergies  Allergen Reactions  . Prednisone     REACTION:  funny feeling    Review of Systems         Constitutional:   No  weight loss, night sweats,  Fevers, chills, + fatigue, or  lassitude.  HEENT:   No headaches,  Difficulty swallowing,  Tooth/dental problems, or  Sore throat,                No sneezing, itching, ear ache, nasal congestion, post nasal drip,   CV:  No chest pain,  Orthopnea, PND, swelling in lower extremities, anasarca, dizziness, palpitations, syncope.   GI  No heartburn, indigestion, abdominal pain, nausea, vomiting, diarrhea, change in bowel habits, loss of appetite, bloody stools.   Resp: No shortness of breath with exertion or at rest.  No excess mucus, no productive cough,  No non-productive cough,  No coughing up of blood.  No change in color of mucus.  No wheezing.  No chest wall deformity  Skin: no rash or lesions.  GU: no dysuria, change in color of urine, no urgency or frequency.  No flank pain, no hematuria   MS:  No joint swelling   Psych:  No change in mood or affect. No depression or anxiety.  No memory loss.        Objective:   Physical Exam      WD, WN, 64 y/o WF in NAD... GENERAL:  Alert & oriented; pleasant & cooperative... HEENT:  [place]/AT, EOM-wnl, PERRLA, EACs- min wax, NOSE- neg, THROAT- clear & wnl. NECK:  Supple w/ decrROM & ant cx scar; no JVD; normal carotid impulses w/o bruits; no thyromegaly or nodules palpated; no lymphadenopathy. CHEST:  Clear to P & A; without wheezes/ rales/ or rhonchi heard... HEART:  Regular Rhythm; without murmurs/ rubs/ or gallops detected... ABDOMEN:  Soft & nontender; normal bowel sounds; no organomegaly or masses detected. EXT: without deformities, mild arthritic  changes; no varicose veins/ +venous insuffic/ no edema. +trigger points -diffuse  NEURO:  Intact  DERM:  No lesions noted; no rash etc...   Assessment & Plan:

## 2011-04-05 NOTE — Patient Instructions (Signed)
Warm heat to back  Add Flexeril 5mg  Three times a day  As needed  Muscle pain/soreness  I will call with labs  Fluids and rest  Please contact office for sooner follow up if symptoms do not improve or worsen or seek emergency care  follow up Dr. Kriste Basque  In 1 month as planned and As needed

## 2011-04-06 ENCOUNTER — Telehealth: Payer: Self-pay | Admitting: Adult Health

## 2011-04-06 LAB — IBC PANEL
Iron: 113 ug/dL (ref 42–145)
Saturation Ratios: 33.4 % (ref 20.0–50.0)
Transferrin: 241.7 mg/dL (ref 212.0–360.0)

## 2011-04-06 LAB — BASIC METABOLIC PANEL
BUN: 12 mg/dL (ref 6–23)
CO2: 28 mEq/L (ref 19–32)
Calcium: 9.4 mg/dL (ref 8.4–10.5)
Chloride: 103 mEq/L (ref 96–112)
Creatinine, Ser: 0.9 mg/dL (ref 0.4–1.2)
GFR: 66.48 mL/min (ref >60.00)
Glucose, Bld: 91 mg/dL (ref 70–99)
Potassium: 4.5 mEq/L (ref 3.5–5.1)
Sodium: 138 mEq/L (ref 135–145)

## 2011-04-06 LAB — HEPATIC FUNCTION PANEL
ALT: 26 U/L (ref 0–35)
AST: 27 U/L (ref 0–37)
Albumin: 4.4 g/dL (ref 3.5–5.2)
Alkaline Phosphatase: 63 U/L (ref 39–117)
Bilirubin, Direct: 0.2 mg/dL (ref 0.0–0.3)
Total Bilirubin: 0.8 mg/dL (ref 0.3–1.2)
Total Protein: 7.4 g/dL (ref 6.0–8.3)

## 2011-04-06 LAB — TSH: TSH: 1.02 u[IU]/mL (ref 0.35–5.50)

## 2011-04-06 LAB — VITAMIN B12: Vitamin B-12: 1019 pg/mL — ABNORMAL HIGH (ref 211–911)

## 2011-04-06 MED ORDER — CYCLOBENZAPRINE HCL 5 MG PO TABS: 5.0000 mg | ORAL_TABLET | Freq: Three times a day (TID) | ORAL | Status: DC | PRN

## 2011-04-06 NOTE — Telephone Encounter (Signed)
Rx flexeril sent to pharm per AVS from yesterday. Pt aware.

## 2011-05-04 ENCOUNTER — Other Ambulatory Visit: Payer: Self-pay | Admitting: Gynecology

## 2011-05-04 DIAGNOSIS — Z1231 Encounter for screening mammogram for malignant neoplasm of breast: Secondary | ICD-10-CM

## 2011-05-12 ENCOUNTER — Ambulatory Visit (INDEPENDENT_AMBULATORY_CARE_PROVIDER_SITE_OTHER): Payer: Medicare Other | Admitting: Pulmonary Disease

## 2011-05-12 ENCOUNTER — Encounter: Payer: Self-pay | Admitting: Pulmonary Disease

## 2011-05-12 DIAGNOSIS — G8929 Other chronic pain: Secondary | ICD-10-CM | POA: Insufficient documentation

## 2011-05-12 DIAGNOSIS — M545 Low back pain, unspecified: Secondary | ICD-10-CM

## 2011-05-12 DIAGNOSIS — T7840XA Allergy, unspecified, initial encounter: Secondary | ICD-10-CM

## 2011-05-12 DIAGNOSIS — M1990 Unspecified osteoarthritis, unspecified site: Secondary | ICD-10-CM

## 2011-05-12 DIAGNOSIS — E7800 Pure hypercholesterolemia, unspecified: Secondary | ICD-10-CM

## 2011-05-12 DIAGNOSIS — K219 Gastro-esophageal reflux disease without esophagitis: Secondary | ICD-10-CM

## 2011-05-12 DIAGNOSIS — F411 Generalized anxiety disorder: Secondary | ICD-10-CM

## 2011-05-12 DIAGNOSIS — K5732 Diverticulitis of large intestine without perforation or abscess without bleeding: Secondary | ICD-10-CM

## 2011-05-12 DIAGNOSIS — I10 Essential (primary) hypertension: Secondary | ICD-10-CM

## 2011-05-12 NOTE — Patient Instructions (Addendum)
Today we updated your med list in our EPIC system...    Continue your current medications the same...  Stay as active as possible & consider Yoga or something like that to help your back & fibromyalgia...  Call for any questions...  Let's plan a follow up visit in 6 months.Marland KitchenMarland Kitchen

## 2011-06-03 DIAGNOSIS — M8580 Other specified disorders of bone density and structure, unspecified site: Secondary | ICD-10-CM | POA: Insufficient documentation

## 2011-06-03 DIAGNOSIS — I10 Essential (primary) hypertension: Secondary | ICD-10-CM | POA: Insufficient documentation

## 2011-06-05 ENCOUNTER — Encounter: Payer: Self-pay | Admitting: Pulmonary Disease

## 2011-06-05 NOTE — Progress Notes (Signed)
Subjective:    Patient ID: Alison Cuevas, female    DOB: [birth year], 65 y.o.   MRN: [id number]  HPI 65 y/o WF here for a follow up office visit... he has multiple medical problems as noted below...   ~  June 29, 2009:  generally stable but under alot of stress- [name] helps... she is concerned about sinuses & saw [facility] who thought her symptoms were stress related and didn't do anything... offered her CT Sinus but she wants to wait, and in the meanwhile she will Rx w/ Saline, Mucinex, Flonase... BP is controlled on Rx;  on diet alone for Chol & wt up 3# this yr;  persist prob w/ pelvic prolapse Rx by DrFontaine recently & he has BMD scheduled as well...  she will ret for FASTING labs soon>>  ~  Nov 16, 2010:  57mo ROV & her CC remains her LBP w/ eval & rx by DrPool, she says she needs rods etc but she is holding off;  She & [name]") just celebrated their 50th anniversary;  She saw TP 11/11 & had meds refilled> note reviewed, given Pneumovax;  See prob list below & fasting labs>>  ~  May 12, 2011:  62mo ROV & she saw TP last mo w/ decr energy, tired all the time, aching/ sore, difficulty resting, etc; felt tohave a FM flair & given Flexeril Tid & it is helping some she says; still w/ chr LBP followed by DrPool on Tylenol/ Mobic... BP remains well regulated on her meds;  Chol has been reasonable on her diet management;  GI remains stable per DrPatterson & GU managed by DrFontaine & DrMacDiarmid (see below);  We reviewed her prev labs and radiologic data...          Problem List:   ALLERGY (ICD-995.3) - she has allergic rhinitis symptoms and increased difficulty w/ the warmer weather... she's been on LORATIDINE 10mg /d, plus saline nasal spray during the day, and FLONASE in each nostril Qhs... she uses Mucinex OTC as needed and had DrCNewman wash out her ears... ~  f/u eval DrCNewman 12/10 for "crazy feelin in my head"- rec continued Saline lavage, Flonase, etc...  HYPERTENSION  (ICD-401.9) - controlled on VERAPAMIL SR 180mg /d + LISINOPRIL 10mg /d...  ~  1/11:  BP=128/70 and similar at home; denies HA, visual changes, CP, palipit, dizziness, syncope, dyspnea, edema, etc... both meds are on the [facility]4 list and she is pleased w/ the results... ~  5/12:  BP= 112/74 & she remains asymptomatic... ~  11/12:  BP= 126/70 & she will continue diet & same meds...  HYPERCHOLESTEROLEMIA (ICD-272.0) - on diet alone... ~  FLP 3/09 showed Tchol 202, Tg 56, HDL 44, LDL 137... rec- diet efforts & get wt down... ~  FLP 9/09 showed TChol 183, TG 78, HDL 37, LDL 131... ~  FLP 3/10 (wt=146#) showed TChol 169, TG 47, HDL 51, LDL 108... improved on diet Rx... ~  FLP 1/11 (wt=149#) showed TChol 172, TG 66, HDL 46, LDL 113 ~  FLP 5/12 (wt=156#) showed TChol 168, TG 70, HDL 48, LDL 106   GERD (ICD-530.81) - mild reflux symptoms.. she uses OTC Prilosec as needed.  Hx of DIVERTICULITIS OF COLON (ICD-562.11) & IBS - she uses MIRALAX daily...s/p right hemicolectomy 10/93 by Lurline Hare... last colonoscopy was 11/07 by DrPatterson showing normal left colon... she's had adhesions and recurrent abd pain due to IBS- eval by GI/ DrPatterson...  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 ~  7/10: eval by drMacDiarmid for Urology- he rec watchful waiting at this time... ~  12/10: she had f/u DrFontaine for GYN- he plans BMD as well... ~  We don't have any of this data> BMD per DrFontaine/ GYN; GU per DrMacDiarmid & he rec holding off on surg if it isn't bothering her...  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 she has been followed by DrPool for neurosurgery- she had a 3 level anterior cervical diskectomy and fusion w/ plating 10/03... 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 She says DrPool has indicated that she needs surg w/ lumbar rods etc but she is holding off...  ANXIETY (ICD-300.00) - on ALPRAZOLAM 0.5mg  as needed... she was anxious about AAA in one of her parents- we did an AbdSonar 3/09 that was neg.  Health Maintenance - GYN= DrFontaine for PAP, BMD, and Mammograms at [facility]... she takes Calcium, women's MVI, Vit D... She had PNEUMOVAX 11/11 at age 29.   Past Surgical History  Procedure Date  . Right hemicolectomy for diverticulitis with abscess 1993  . Abdominal hysterectomy     TAH  . Resection of colon     BENIGN TUMOR  . Tubal ligation   . Cholecystectomy, laparoscopic   . Cspine surgery     Dr. Estelle Grumbles level ant cerv diskectomy /fusion w/plating    Outpatient Encounter Prescriptions as of 05/12/2011  Medication Sig Dispense Refill  . ALPRAZolam (XANAX) 0.5 MG tablet Take 1 tablet (0.5 mg total) by mouth 3 (three) times daily as needed for sleep. Take 1/2 to 1 tablet by mouth three times a day as needed for nerves  90 tablet  2  . B Complex-C (SUPER B COMPLEX PO) Take by mouth daily.        . Calcium Carbonate-Vitamin D (CALTRATE 600+D) 600-400 MG-UNIT per tablet Take 1 tablet by mouth daily.        . Cholecalciferol (VITAMIN D) 2000 UNITS CAPS Take 1 capsule by mouth daily.        . cyclobenzaprine (FLEXERIL) 5 MG tablet Take 1 tablet (5 mg total) by mouth 3 (three) times daily as needed for muscle spasms.  60 tablet  0  . fish oil-omega-3 fatty acids 1000 MG capsule Take 2 g by mouth daily.        . fluticasone (FLONASE) 50 MCG/ACT nasal spray 2 sprays by Nasal route at bedtime as needed.        Marland Kitchen guaiFENesin (MUCINEX) 600 MG 12 hr tablet Take 1,200 mg by mouth 2 (two) times daily.        Marland Kitchen lisinopril (PRINIVIL,ZESTRIL) 10 MG tablet Take 10 mg by mouth daily.        . meloxicam (MOBIC) 15 MG tablet Take 15 mg by mouth daily. As needed for arthritis pain       . Multiple Vitamins-Minerals (WOMENS MULTIVITAMIN PLUS  PO) Take 1 tablet by mouth daily.        . polyethylene glycol powder (GLYCOLAX/MIRALAX) powder Take 17 g by mouth daily as needed.        . verapamil (CALAN-SR) 180 MG CR tablet TAKE 1 TABLET EVERY DAY FOR BP  90 tablet  1  . vitamin B-12 (CYANOCOBALAMIN) 1000 MCG tablet Take 1,000 mcg by mouth daily.  Allergies  Allergen Reactions  . Prednisone     REACTION: funny feeling    Current Medications, Allergies, Past Medical History, Past Surgical History, Family History, and Social History were reviewed in [employer] record.   Review of Systems        See HPI - all other systems neg except as noted... The patient complains of headaches.  The patient denies anorexia, fever, weight loss, weight gain, vision loss, decreased hearing, hoarseness, chest pain, syncope, dyspnea on exertion, peripheral edema, prolonged cough, hemoptysis, abdominal pain, melena, hematochezia, severe indigestion/heartburn, hematuria, incontinence, muscle weakness, suspicious skin lesions, transient blindness, difficulty walking, depression, unusual weight change, abnormal bleeding, enlarged lymph nodes, and angioedema.     Objective:   Physical Exam     WD, WN, 65 y/o WF in NAD... GENERAL:  Alert & oriented; pleasant & cooperative... HEENT:  [place]/AT, EOM-wnl, PERRLA, EACs- min wax, NOSE- neg, THROAT- clear & wnl. NECK:  Supple w/ decrROM & ant cx scar; no JVD; normal carotid impulses w/o bruits; no thyromegaly or nodules palpated; no lymphadenopathy. CHEST:  Clear to P & A; without wheezes/ rales/ or rhonchi heard... HEART:  Regular Rhythm; without murmurs/ rubs/ or gallops detected... ABDOMEN:  Soft & nontender; normal bowel sounds; no organomegaly or masses detected. EXT: without deformities, mild arthritic changes; no varicose veins/ +venous insuffic/ no edema. NEURO:  CN's intact; no focal neuro deficits; normal DTR's... DERM:  No lesions noted; no rash etc...  RADIOLOGY DATA:   Reviewed in the EPIC EMR & discussed w/ the patient...  LABORATORY DATA:  Reviewed in the EPIC EMR & discussed w/ the patient...   Assessment & Plan:   HBP>  Controlled on meds, continue same...  HYPERLIPID>  Stable on diet alone, rec to incr exercise as well...  GI>  GERD, Divertics, IBS>  Stable on Omep + Miralax, etc...  GU>  Pelvic organ prolapse>  eval & management by DrFontaine for GYN & DrMacDiarmid for Urology...  DJD/ Neck & Back Pain>  Managed by DrPool for NS & she is holding off on surg...  ANXIETY>  Stable on Alpraz as needed...   Patient's Medications  New Prescriptions   No medications on file  Previous Medications   ALPRAZOLAM (XANAX) 0.5 MG TABLET    Take 1 tablet (0.5 mg total) by mouth 3 (three) times daily as needed for sleep. Take 1/2 to 1 tablet by mouth three times a day as needed for nerves   B COMPLEX-C (SUPER B COMPLEX PO)    Take by mouth daily.     CALCIUM CARBONATE-VITAMIN D (CALTRATE 600+D) 600-400 MG-UNIT PER TABLET    Take 1 tablet by mouth daily.     CHOLECALCIFEROL (VITAMIN D) 2000 UNITS CAPS    Take 1 capsule by mouth daily.     CYCLOBENZAPRINE (FLEXERIL) 5 MG TABLET    Take 1 tablet (5 mg total) by mouth 3 (three) times daily as needed for muscle spasms.   FISH OIL-OMEGA-3 FATTY ACIDS 1000 MG CAPSULE    Take 2 g by mouth daily.     FLUTICASONE (FLONASE) 50 MCG/ACT NASAL SPRAY    2 sprays by Nasal route at bedtime as needed.     GUAIFENESIN (MUCINEX) 600 MG 12 HR TABLET    Take 1,200 mg by mouth 2 (two) times daily.     LISINOPRIL (PRINIVIL,ZESTRIL) 10 MG TABLET    Take 10 mg by mouth daily.     MELOXICAM (MOBIC) 15 MG TABLET  Take 15 mg by mouth daily. As needed for arthritis pain    MULTIPLE VITAMINS-MINERALS (WOMENS MULTIVITAMIN PLUS PO)    Take 1 tablet by mouth daily.     NYSTATIN-TRIAMCINOLONE (MYCOLOG II) CREAM    Apply topically 4 (four) times daily.     POLYETHYLENE GLYCOL POWDER (GLYCOLAX/MIRALAX) POWDER    Take 17 g by mouth daily as  needed.     VERAPAMIL (CALAN-SR) 180 MG CR TABLET    TAKE 1 TABLET EVERY DAY FOR BP   VITAMIN B-12 (CYANOCOBALAMIN) 1000 MCG TABLET    Take 1,000 mcg by mouth daily.    Modified Medications   No medications on file  Discontinued Medications   VERAPAMIL (COVERA HS) 180 MG (CO) 24 HR TABLET    Take 180 mg by mouth at bedtime. For blood pressure

## 2011-06-10 ENCOUNTER — Encounter: Payer: Self-pay | Admitting: Gynecology

## 2011-06-10 ENCOUNTER — Ambulatory Visit (HOSPITAL_COMMUNITY)
Admission: RE | Admit: 2011-06-10 | Discharge: 2011-06-10 | Disposition: A | Discharge status: Home / Self Care | Payer: Medicare Other | Source: Ambulatory Visit | Attending: Gynecology | Admitting: Gynecology

## 2011-06-10 ENCOUNTER — Ambulatory Visit (INDEPENDENT_AMBULATORY_CARE_PROVIDER_SITE_OTHER): Payer: Medicare Other | Admitting: Gynecology

## 2011-06-10 VITALS — BP 114/70 | Ht 62.0 in | Wt 159.0 lb

## 2011-06-10 DIAGNOSIS — N952 Postmenopausal atrophic vaginitis: Secondary | ICD-10-CM

## 2011-06-10 DIAGNOSIS — R82998 Other abnormal findings in urine: Secondary | ICD-10-CM

## 2011-06-10 DIAGNOSIS — N8111 Cystocele, midline: Secondary | ICD-10-CM

## 2011-06-10 DIAGNOSIS — K469 Unspecified abdominal hernia without obstruction or gangrene: Secondary | ICD-10-CM

## 2011-06-10 DIAGNOSIS — N816 Rectocele: Secondary | ICD-10-CM

## 2011-06-10 DIAGNOSIS — Z1231 Encounter for screening mammogram for malignant neoplasm of breast: Secondary | ICD-10-CM

## 2011-06-10 MED ORDER — NYSTATIN-TRIAMCINOLONE 100000-0.1 UNIT/GM-% EX CREA: TOPICAL_CREAM | Freq: Four times a day (QID) | CUTANEOUS | Status: DC

## 2011-06-10 NOTE — Progress Notes (Signed)
Dierra Riesgo 11/27/1945 [id number]        65 y.o.  for follow up. Several issues as noted below.  Past medical history,surgical history, medications, allergies, family history and social history were all reviewed and documented in the EPIC chart. ROS:  Was performed and pertinent positives and negatives are included in the history.  Exam: chaperone present Filed Vitals:   06/10/11 1008  BP: 114/70   General appearance  Normal Skin grossly normal Head/Neck normal with no cervical or supraclavicular adenopathy thyroid normal Lungs  clear Cardiac RR, without RMG Abdominal  soft, nontender, without masses, organomegaly or hernia Breasts  examined lying and sitting without masses, retractions, discharge or axillary adenopathy. Pelvic  Ext/BUS/vagina  With atrophic changes. Second degree cystocele with enterocele component telescoping approximately halfway down the vagina. First degree rectocele.   Adnexa  Without masses or tenderness    Anus and perineum  normal   Rectovaginal  normal sphincter tone without palpated masses or tenderness.    Assessment/Plan:  65 y.o. female for annual exam.    1. Cystocele/enterocele/rectocele. Patient does feel some pressure symptoms at times but she notes more issue with some rectal incontinence. She will loose stool when she bears down.  On exam she does have appropriate sphincter tone for her age without significant laxity noted. I reviewed with her this is more of a sphincter issue and that we could evaluate her for this and consider a repair, if appropriate. Patient is not interested in surgery, said she would prefer to monitor and deal with it at this point.  2. Atrophic genital changes. Patient does use Mytrex cream intermittently for itching symptoms which seems to do well for her. I refilled this and she will continue to use his symptomatically. 3. Breast health. SBE monthly reviewed. She has mammogram scheduled today and will continue with annual  mammography. 4. Pap smear. She has no history of abnormal Pap smears in the past with her last Pap smear 2011. I discussed current screening guidelines to include less frequent screening intervals as well as stopping at age 22. I did not do a Pap smear today and we'll readdress this on an annual basis. 5. Colonoscopy. Patient reports being up to date and will follow up through her primary for this. 6. Bone health. Her DEXA scan from 06/2009 was normal. She does have a history of osteopenia -1.2 T score her last bone density was normal. We'll continue observation with repeat in several years. Increased calcium vitamin D was discussed with her. 7. Health maintenance. No blood work was done today as it is all done through her primary physician's office. Assuming she continues well and she will see me in a year. Sooner if any issues.    Dara Lords MD, 10:51 AM 06/10/2011

## 2011-06-10 NOTE — Progress Notes (Signed)
Addended by: Landis Martins R on: 06/10/2011 12:33 PM   Modules accepted: Orders

## 2011-06-10 NOTE — Patient Instructions (Signed)
Follow up in one year for recheck.

## 2011-06-13 MED ORDER — SULFAMETHOXAZOLE-TRIMETHOPRIM 800-160 MG PO TABS: 1.0000 | ORAL_TABLET | Freq: Two times a day (BID) | ORAL | Status: AC

## 2011-06-13 NOTE — Progress Notes (Signed)
Addended by: Dara Lords on: 06/13/2011 02:04 PM   Modules accepted: Orders

## 2011-06-24 ENCOUNTER — Other Ambulatory Visit: Payer: Self-pay | Admitting: Pulmonary Disease

## 2011-07-05 ENCOUNTER — Other Ambulatory Visit: Payer: Self-pay | Admitting: *Deleted

## 2011-07-05 MED ORDER — FLUTICASONE PROPIONATE 50 MCG/ACT NA SUSP: 2.0000 | Freq: Every evening | NASAL | Status: DC | PRN

## 2011-07-11 ENCOUNTER — Encounter: Payer: Self-pay | Admitting: Gynecology

## 2011-07-11 ENCOUNTER — Ambulatory Visit (INDEPENDENT_AMBULATORY_CARE_PROVIDER_SITE_OTHER): Payer: Medicare Other | Admitting: Gynecology

## 2011-07-11 ENCOUNTER — Other Ambulatory Visit: Payer: Self-pay | Admitting: Gynecology

## 2011-07-11 DIAGNOSIS — N8111 Cystocele, midline: Secondary | ICD-10-CM

## 2011-07-11 DIAGNOSIS — R109 Unspecified abdominal pain: Secondary | ICD-10-CM

## 2011-07-11 DIAGNOSIS — K469 Unspecified abdominal hernia without obstruction or gangrene: Secondary | ICD-10-CM | POA: Diagnosis not present

## 2011-07-11 DIAGNOSIS — N390 Urinary tract infection, site not specified: Secondary | ICD-10-CM

## 2011-07-11 DIAGNOSIS — N816 Rectocele: Secondary | ICD-10-CM

## 2011-07-11 DIAGNOSIS — N898 Other specified noninflammatory disorders of vagina: Secondary | ICD-10-CM

## 2011-07-11 DIAGNOSIS — R1030 Lower abdominal pain, unspecified: Secondary | ICD-10-CM

## 2011-07-11 LAB — URINALYSIS W MICROSCOPIC + REFLEX CULTURE
Bilirubin Urine: NEGATIVE
Casts: NONE SEEN
Crystals: NONE SEEN
Glucose, UA: NEGATIVE mg/dL
Ketones, ur: NEGATIVE mg/dL
Nitrite: NEGATIVE
Protein, ur: NEGATIVE mg/dL
Specific Gravity, Urine: 1.015 (ref 1.005–1.030)
Urobilinogen, UA: 0.2 mg/dL (ref 0.0–1.0)
pH: 7.5 (ref 5.0–8.0)

## 2011-07-11 LAB — WET PREP FOR TRICH, YEAST, CLUE
Clue Cells Wet Prep HPF POC: NEGATIVE
Trich, Wet Prep: NEGATIVE
Yeast Wet Prep HPF POC: NEGATIVE

## 2011-07-11 MED ORDER — CIPROFLOXACIN HCL 250 MG PO TABS: 250.0000 mg | ORAL_TABLET | Freq: Two times a day (BID) | ORAL | Status: AC

## 2011-07-11 NOTE — Patient Instructions (Signed)
Take antibiotics as prescribed. Follow up in one month for pessary fitting appointment.

## 2011-07-11 NOTE — Progress Notes (Signed)
Patient presents with several day history of lower abdominal discomfort, vaginal burning and worsening of her vaginal prolapse symptoms. She notes that her prolapse came out and she had to digitally replace it and has had some vaginal burning and lower abdominal discomfort since then. No fevers chills nausea vomiting diarrhea constipation. She is known to have an enterocele, cystocele and rectocele. She has seen physicians down at [facility] in consideration of repair but has been reluctant to do this up until now. She also saw Dr. Jacquelyne Balint at [facility] urology who discussed doing a cystocele and sling procedure but I am not sure this would address her other issues.  She is also having issues with rectal incontinence.  Exam with Sherri chaperone present Spine no CVA tenderness Abdomen mild lower midline discomfort no rebound guarding masses or organomegaly Pelvic external BUS vagina some with pelvic relaxation with cystocele enterocele and rectocele noted. Bimanual without masses or tenderness. slight discharge noted  Assessment and plan: 1. Slight discharge. Wet prep is negative. Will hold on treatment. 2. UTI. UA is grossly abnormal and we'll treat with ciprofloxacin 250 twice a day x7 days. Follow up if symptoms persist or recur. 3. Cystocele/rectocele/enterocele. Again discussed options. I think if she is going to have something surgical, I recommend [facility] where they could consider doing laparoscopic sacro-colpopexy possible sphincter repair, sling and A&P repair.  I did review again options to include pessary which we had discussed in the past and encouraged her to consider this and she wants to go ahead and try this and will return in a month after we treat her bladder and the inflammation subsides she will return for pessary fitting appointment.

## 2011-07-13 ENCOUNTER — Telehealth: Payer: Self-pay | Admitting: *Deleted

## 2011-07-13 NOTE — Telephone Encounter (Signed)
Pt was seen 07/11/11 for vaginal burning, she was giving Cipro 250 mg. Pt said that on bottle it said not to drink milk while taking medication and she has done so. Pt wants to know if does she need any extra medication since she drank milk? She also wanted that name of the doctor that you spoke with her about at [facility]. Please advise.

## 2011-07-13 NOTE — Telephone Encounter (Signed)
She should be okay just to take the rest of the pills without milk. She saw Dr. Hazle Coca at [facility]

## 2011-07-13 NOTE — Telephone Encounter (Signed)
Pt informed with the below note. 

## 2011-07-14 LAB — URINE CULTURE: Colony Count: >=100000

## 2011-07-18 ENCOUNTER — Telehealth: Payer: Self-pay | Admitting: *Deleted

## 2011-07-18 MED ORDER — TERCONAZOLE 0.4 % VA CREA: 1.0000 | TOPICAL_CREAM | Freq: Every day | VAGINAL | Status: AC

## 2011-07-18 NOTE — Telephone Encounter (Signed)
Pt informed with the below note. Will make ov if no relief.

## 2011-07-18 NOTE — Telephone Encounter (Signed)
Was tried Terazol 7 day cream. If her symptoms persist then office visit.

## 2011-07-18 NOTE — Telephone Encounter (Signed)
Pt was seen on 07/11/11 for vaginal burning and giving Cipro 250 mg. Pt has taking all of rx and still having vaginal burning, stomach pressure with right lower pelvic pain at times that comes and goes. Pt main complaint is the vaginal burning, it does come and go. Pt said that it feels like "heater in vagina". Please advise

## 2011-07-21 ENCOUNTER — Encounter: Payer: Self-pay | Admitting: Gynecology

## 2011-07-21 ENCOUNTER — Ambulatory Visit (INDEPENDENT_AMBULATORY_CARE_PROVIDER_SITE_OTHER): Payer: Medicare Other | Admitting: Gynecology

## 2011-07-21 DIAGNOSIS — N9489 Other specified conditions associated with female genital organs and menstrual cycle: Secondary | ICD-10-CM | POA: Diagnosis not present

## 2011-07-21 DIAGNOSIS — N949 Unspecified condition associated with female genital organs and menstrual cycle: Secondary | ICD-10-CM

## 2011-07-21 LAB — URINALYSIS W MICROSCOPIC + REFLEX CULTURE
Bilirubin Urine: NEGATIVE
Glucose, UA: NEGATIVE mg/dL
Hgb urine dipstick: NEGATIVE
Ketones, ur: NEGATIVE mg/dL
Leukocytes, UA: NEGATIVE
Nitrite: NEGATIVE
Protein, ur: NEGATIVE mg/dL
Specific Gravity, Urine: 1.01 (ref 1.005–1.030)
Urobilinogen, UA: 0.2 mg/dL (ref 0.0–1.0)
pH: 7 (ref 5.0–8.0)

## 2011-07-21 MED ORDER — CLOBETASOL PROPIONATE 0.05 % EX CREA: TOPICAL_CREAM | Freq: Two times a day (BID) | CUTANEOUS | Status: DC

## 2011-07-21 NOTE — Patient Instructions (Signed)
Apply cream as directed

## 2011-07-21 NOTE — Progress Notes (Signed)
Patient presents complaining of vaginal burning externally. She called earlier and we treated her over the phone with Terazol 7 day cream and she is 3 days into treatment but notes that the burning externally seems to getting worse.  Exam with Sherrilyn Rist chaperone present External without overt abnormality with atrophic changes. The area she's pointing to is in the upper right labia minora.  Speculum exam shows atrophic vaginal changes no discharge or other abnormalities. Bimanual without masses or tenderness  Assessment and plan: Vulvar burning exam is normal. UA today is negative. Did not do a wet prep as she is currently on Terazol cream. We'll treat with Temovate 0.05% cream twice a day times several days then daily times a week and see if this doesn't resolve her symptoms. Also recommended Benadryl 25-50 mg at bedtime as at night seems to be when her symptoms are the worst.

## 2011-08-05 ENCOUNTER — Telehealth: Payer: Self-pay | Admitting: Pulmonary Disease

## 2011-08-05 MED ORDER — AZITHROMYCIN 250 MG PO TABS: ORAL_TABLET | ORAL | Status: AC

## 2011-08-05 NOTE — Telephone Encounter (Signed)
Called and spoke with pt and she is c/o cough with nasal congestion and headache. Using the mucinex, nasal saline spray, flonase.   She stated that she picked this up from her husband.  She is requesting a zpak.  Ok per SN to send in ZPak   #1  Take as directed with no refills.  Pt is aware that this has been sent to the pharmacy for her.

## 2011-08-30 ENCOUNTER — Ambulatory Visit: Payer: Medicare Other | Admitting: Gynecology

## 2011-09-02 ENCOUNTER — Ambulatory Visit: Payer: Medicare Other | Admitting: Gynecology

## 2011-09-07 ENCOUNTER — Other Ambulatory Visit: Payer: Self-pay | Admitting: Pulmonary Disease

## 2011-09-16 ENCOUNTER — Encounter: Payer: Self-pay | Admitting: Gynecology

## 2011-09-16 ENCOUNTER — Ambulatory Visit (INDEPENDENT_AMBULATORY_CARE_PROVIDER_SITE_OTHER): Payer: Medicare Other | Admitting: Gynecology

## 2011-09-16 DIAGNOSIS — N816 Rectocele: Secondary | ICD-10-CM

## 2011-09-16 DIAGNOSIS — N8111 Cystocele, midline: Secondary | ICD-10-CM | POA: Diagnosis not present

## 2011-09-16 DIAGNOSIS — K469 Unspecified abdominal hernia without obstruction or gangrene: Secondary | ICD-10-CM | POA: Diagnosis not present

## 2011-09-16 NOTE — Patient Instructions (Signed)
Office will call you when pessary arrives.

## 2011-09-16 NOTE — Progress Notes (Signed)
Patient presents for trial of pessary. She had talk to the office at [facility] considering surgery and they had recommended she have a trial of pessary. I talked her about this before she had declined but now wants to go ahead and try it.  Exam was Sherrilyn Rist chaperone present External BUS vagina was atrophic genital changes. First second degree cystocele second to third degree enterocele second degree rectocele noted. Bimanual without masses or tenderness  [place]horn 2-1/2 fitted. Patient ambulated was in place and in no discomfort. It appears to remain in place and support her enterocele. I think her enterocele is the main issue from with protruding and hopefully this will help her with that. We will call her when her pessary arrives and then we'll go from there.

## 2011-09-20 ENCOUNTER — Telehealth: Payer: Self-pay | Admitting: Pulmonary Disease

## 2011-09-20 MED ORDER — ALPRAZOLAM 0.5 MG PO TABS: ORAL_TABLET | ORAL | Status: DC

## 2011-09-20 NOTE — Telephone Encounter (Signed)
[facility] [address] [place]  Alprazolam 0.5 mg  Take 1/2 to 1 tablet my mouth 3 times a day for nerves  #90 X2 Allergies  Allergen Reactions  . Prednisone     REACTION: funny feeling   Dr Kriste Basque is this ok >? Thank you

## 2011-10-31 DIAGNOSIS — H04129 Dry eye syndrome of unspecified lacrimal gland: Secondary | ICD-10-CM | POA: Diagnosis not present

## 2011-10-31 DIAGNOSIS — H1130 Conjunctival hemorrhage, unspecified eye: Secondary | ICD-10-CM | POA: Diagnosis not present

## 2011-11-09 ENCOUNTER — Ambulatory Visit: Payer: Medicare Other | Admitting: Pulmonary Disease

## 2011-11-22 ENCOUNTER — Telehealth: Payer: Self-pay | Admitting: Pulmonary Disease

## 2011-11-22 ENCOUNTER — Other Ambulatory Visit: Payer: Self-pay | Admitting: Pulmonary Disease

## 2011-11-22 DIAGNOSIS — D649 Anemia, unspecified: Secondary | ICD-10-CM

## 2011-11-22 DIAGNOSIS — I10 Essential (primary) hypertension: Secondary | ICD-10-CM

## 2011-11-22 DIAGNOSIS — F411 Generalized anxiety disorder: Secondary | ICD-10-CM

## 2011-11-22 DIAGNOSIS — K5732 Diverticulitis of large intestine without perforation or abscess without bleeding: Secondary | ICD-10-CM

## 2011-11-22 DIAGNOSIS — E7800 Pure hypercholesterolemia, unspecified: Secondary | ICD-10-CM

## 2011-11-22 DIAGNOSIS — M8580 Other specified disorders of bone density and structure, unspecified site: Secondary | ICD-10-CM

## 2011-11-22 NOTE — Telephone Encounter (Signed)
LMOM TCB x1 at home number. ATC cell number x1, but was told "this person does not have a voice mail that is set up yet.  Good bye."  SN does not have any openings before 6.22.13.

## 2011-11-22 NOTE — Telephone Encounter (Signed)
Per SN--ok to add pt on for June 20 at 3pm.--she can have fasting labs prior to her appt and these have already been put into the computer.  thanks

## 2011-11-22 NOTE — Telephone Encounter (Signed)
Pt aware and appt set. Keia Rask, CMA  

## 2011-11-22 NOTE — Telephone Encounter (Signed)
Spoke with pt. She is asking about rescheduling her 6 month rov because her spouse has to have back  surgery on 12/16/11 and after the surgery she can not leave him alone for a few wks. She wants to know if she can be worked in sooner, or if the appt can be moved until July sometime. She is also asking if SN wants her to go ahead and come in for fasting labs in the meantime. Please advise, thanks!

## 2011-12-07 DIAGNOSIS — H04129 Dry eye syndrome of unspecified lacrimal gland: Secondary | ICD-10-CM | POA: Diagnosis not present

## 2011-12-07 DIAGNOSIS — H00029 Hordeolum internum unspecified eye, unspecified eyelid: Secondary | ICD-10-CM | POA: Diagnosis not present

## 2011-12-09 ENCOUNTER — Other Ambulatory Visit (INDEPENDENT_AMBULATORY_CARE_PROVIDER_SITE_OTHER): Payer: Medicare Other

## 2011-12-09 DIAGNOSIS — I10 Essential (primary) hypertension: Secondary | ICD-10-CM | POA: Diagnosis not present

## 2011-12-09 DIAGNOSIS — M8580 Other specified disorders of bone density and structure, unspecified site: Secondary | ICD-10-CM

## 2011-12-09 DIAGNOSIS — D649 Anemia, unspecified: Secondary | ICD-10-CM | POA: Diagnosis not present

## 2011-12-09 DIAGNOSIS — F411 Generalized anxiety disorder: Secondary | ICD-10-CM | POA: Diagnosis not present

## 2011-12-09 DIAGNOSIS — E7800 Pure hypercholesterolemia, unspecified: Secondary | ICD-10-CM | POA: Diagnosis not present

## 2011-12-09 DIAGNOSIS — M899 Disorder of bone, unspecified: Secondary | ICD-10-CM | POA: Diagnosis not present

## 2011-12-09 LAB — CBC WITH DIFFERENTIAL/PLATELET
Basophils Absolute: 0 10*3/uL (ref 0.0–0.1)
Basophils Relative: 0.7 % (ref 0.0–3.0)
Eosinophils Absolute: 0.1 10*3/uL (ref 0.0–0.7)
Eosinophils Relative: 2.1 % (ref 0.0–5.0)
HCT: 39 % (ref 36.0–46.0)
Hemoglobin: 13.1 g/dL (ref 12.0–15.0)
Lymphocytes Relative: 38.2 % (ref 12.0–46.0)
Lymphs Abs: 2.1 10*3/uL (ref 0.7–4.0)
MCHC: 33.6 g/dL (ref 30.0–36.0)
MCV: 90.6 fl (ref 78.0–100.0)
Monocytes Absolute: 0.3 10*3/uL (ref 0.1–1.0)
Monocytes Relative: 5.7 % (ref 3.0–12.0)
Neutro Abs: 2.9 10*3/uL (ref 1.4–7.7)
Neutrophils Relative %: 53.3 % (ref 43.0–77.0)
Platelets: 154 10*3/uL (ref 150.0–400.0)
RBC: 4.3 Mil/uL (ref 3.87–5.11)
RDW: 12.9 % (ref 11.5–14.6)
WBC: 5.4 10*3/uL (ref 4.5–10.5)

## 2011-12-09 LAB — BASIC METABOLIC PANEL
BUN: 11 mg/dL (ref 6–23)
CO2: 25 mEq/L (ref 19–32)
Calcium: 9.3 mg/dL (ref 8.4–10.5)
Chloride: 106 mEq/L (ref 96–112)
Creatinine, Ser: 1 mg/dL (ref 0.4–1.2)
GFR: 61.58 mL/min (ref >60.00)
Glucose, Bld: 86 mg/dL (ref 70–99)
Potassium: 4.2 mEq/L (ref 3.5–5.1)
Sodium: 138 mEq/L (ref 135–145)

## 2011-12-09 LAB — HEPATIC FUNCTION PANEL
ALT: 20 U/L (ref 0–35)
AST: 25 U/L (ref 0–37)
Albumin: 3.8 g/dL (ref 3.5–5.2)
Alkaline Phosphatase: 52 U/L (ref 39–117)
Bilirubin, Direct: 0.1 mg/dL (ref 0.0–0.3)
Total Bilirubin: 0.7 mg/dL (ref 0.3–1.2)
Total Protein: 6.7 g/dL (ref 6.0–8.3)

## 2011-12-09 LAB — LIPID PANEL
Cholesterol: 161 mg/dL (ref 0–200)
HDL: 47.5 mg/dL (ref >39.00)
LDL Cholesterol: 95 mg/dL (ref 0–99)
Total CHOL/HDL Ratio: 3
Triglycerides: 93 mg/dL (ref 0.0–149.0)
VLDL: 18.6 mg/dL (ref 0.0–40.0)

## 2011-12-09 LAB — TSH: TSH: 1.32 u[IU]/mL (ref 0.35–5.50)

## 2011-12-10 LAB — VITAMIN D 25 HYDROXY (VIT D DEFICIENCY, FRACTURES): Vit D, 25-Hydroxy: 38 ng/mL (ref 30–89)

## 2011-12-15 ENCOUNTER — Ambulatory Visit (INDEPENDENT_AMBULATORY_CARE_PROVIDER_SITE_OTHER): Payer: Medicare Other | Admitting: Pulmonary Disease

## 2011-12-15 ENCOUNTER — Encounter: Payer: Self-pay | Admitting: Pulmonary Disease

## 2011-12-15 VITALS — BP 122/68 | HR 64 | Temp 97.0°F | Ht 61.5 in | Wt 155.6 lb

## 2011-12-15 DIAGNOSIS — K5732 Diverticulitis of large intestine without perforation or abscess without bleeding: Secondary | ICD-10-CM | POA: Diagnosis not present

## 2011-12-15 DIAGNOSIS — F411 Generalized anxiety disorder: Secondary | ICD-10-CM

## 2011-12-15 DIAGNOSIS — M545 Low back pain, unspecified: Secondary | ICD-10-CM

## 2011-12-15 DIAGNOSIS — N819 Female genital prolapse, unspecified: Secondary | ICD-10-CM

## 2011-12-15 DIAGNOSIS — I10 Essential (primary) hypertension: Secondary | ICD-10-CM

## 2011-12-15 DIAGNOSIS — E7800 Pure hypercholesterolemia, unspecified: Secondary | ICD-10-CM | POA: Diagnosis not present

## 2011-12-15 DIAGNOSIS — K219 Gastro-esophageal reflux disease without esophagitis: Secondary | ICD-10-CM

## 2011-12-15 DIAGNOSIS — M1990 Unspecified osteoarthritis, unspecified site: Secondary | ICD-10-CM

## 2011-12-15 MED ORDER — MELOXICAM 15 MG PO TABS: 15.0000 mg | ORAL_TABLET | Freq: Every day | ORAL | Status: DC

## 2011-12-15 MED ORDER — CYCLOBENZAPRINE HCL 5 MG PO TABS: 5.0000 mg | ORAL_TABLET | Freq: Three times a day (TID) | ORAL | Status: DC | PRN

## 2011-12-15 MED ORDER — HYDROCODONE-ACETAMINOPHEN 5-500 MG PO TABS: ORAL_TABLET | ORAL | Status: DC

## 2011-12-15 NOTE — Patient Instructions (Addendum)
Today we updated your med list in our EPIC system...    Continue your current medications the same...    We wrote a new prescription for VICODIN (Hydrocodone) to use as needed for pain...  Today we reviewed your recent blod work & we gave you a copy for your records...  Call for any questions...  Let's continue our 6 month follow up visits.Marland KitchenMarland Kitchen

## 2011-12-16 ENCOUNTER — Encounter: Payer: Self-pay | Admitting: Pulmonary Disease

## 2011-12-16 NOTE — Progress Notes (Signed)
Subjective:    Patient ID: Alison Cuevas, female    DOB: 1945/11/15, 66 y.o.   MRN: [id number]  HPI 66 y/o WF here for a follow up office visit... he has multiple medical problems as noted below...   ~  Jan11:  generally stable but under alot of stress- Alpraz helps... she is concerned about sinuses & saw [facility] who thought her symptoms were stress related and didn't do anything... offered her CT Sinus but she wants to wait, and in the meanwhile she will Rx w/ Saline, Mucinex, Flonase... BP is controlled on Rx;  on diet alone for Chol & wt up 3# this yr;  persist prob w/ pelvic prolapse Rx by DrFontaine recently & he has BMD scheduled as well...  she will ret for FASTING labs soon>>  ~  Nov 16, 2010:  64mo ROV & her CC remains her LBP w/ eval & rx by DrPool, she says she needs rods etc but she is holding off;  She & [name]") just celebrated their 50th anniversary;  She saw TP 11/11 & had meds refilled> note reviewed, given Pneumovax;  See prob list below & fasting labs>>  ~  May 12, 2011:  46mo ROV & she saw TP last mo w/ decr energy, tired all the time, aching/ sore, difficulty resting, etc; felt to have a FM flair & given Flexeril Tid & it is helping some she says; still w/ chr LBP followed by DrPool on Tylenol/ Mobic... BP remains well regulated on her meds;  Chol has been reasonable on her diet management;  GI remains stable per DrPatterson & GU managed by DrFontaine & DrMacDiarmid (see below);  We reviewed her prev labs and radiologic data...  ~  December 15, 2011:  24mo ROV & Alison Cuevas has had 4-5 visits w/ GYN, DrFontaine for her organ prolapse w/ cystocele, enterocele, rectocele; they have decided on pessary rx & she was fitted- now waiting on the device to arrive for insertion;  She remains under stress w/ "Pete's" medical issues, back pain, etc... Allergies> on OTC antihist, Benedryl, Flonase, Mucinex, etc... HBP> on Lisin10, Verap180; BP= 122/68 and she denies CP, palpit, SOB, edema,  etc... Chol> on diet + FishOil; f/u FLP last week showed TChol 161, TG 93, HDL 48, LDL 95 GI- GERD. Divertics> on Miralax; she had a right hemicolectomy in 1993, last colon was 11/07 by DrPatterson w/ normal left colon... GYN- pelvic organ prolapse> per DrFontaine as noted... DJD, FM, neck&back pain> on Flex5Tid & Mobic15 as needed; she requests stronger prn pain med & we wrote for Vicodin Q8h prn... Anxiety> she is under a lot of stress; uses Xanax 0.5mg  prn...    We reviewed prob list, meds, xrays and labs> see below>> LABS 6/13:  FLP- at goals on diet;  Chems- wnl;  CBC- wnl;  TSH=1.32;  VitD=38 on 2000u daily...          Problem List:   ALLERGY (ICD-995.3) - she has allergic rhinitis symptoms and increased difficulty w/ the warmer weather... she's been on LORATIDINE 10mg /d, plus saline nasal spray during the day, and FLONASE in each nostril Qhs... she uses Mucinex OTC as needed and had DrCNewman wash out her ears... ~  f/u eval DrCNewman 12/10 for "crazy feelin in my head"- rec continued Saline lavage, Flonase, etc...  HYPERTENSION (ICD-401.9) - controlled on VERAPAMIL SR 180mg /d + LISINOPRIL 10mg /d...  ~  CXR 3/10 showed normal heart size, clear lungs, s/p CSpine fusion... ~  1/11:  BP=128/70 and similar  at home; denies HA, visual changes, CP, palipit, dizziness, syncope, dyspnea, edema, etc... both meds are on the [facility] $4 list and she is pleased w/ the results... ~  5/12:  BP= 112/74 & she remains asymptomatic... ~  11/12:  BP= 126/70 & she will continue diet & same meds... ~  6/13:  BP= 122/68 and she denies CP, palpit, SOB, edema, etc...   HYPERCHOLESTEROLEMIA (ICD-272.0) - on diet alone... ~  FLP 3/09 showed Tchol 202, Tg 56, HDL 44, LDL 137... rec- diet efforts & get wt down... ~  FLP 9/09 showed TChol 183, TG 78, HDL 37, LDL 131... ~  FLP 3/10 (wt=146#) showed TChol 169, TG 47, HDL 51, LDL 108... improved on diet Rx... ~  FLP 1/11 (wt=149#) showed TChol 172, TG 66, HDL 46, LDL  113 ~  FLP 5/12 (wt=156#) showed TChol 168, TG 70, HDL 48, LDL 106 ~  FLP 6/13 (wt=156#) showed TChol 161, TG 93, HDL 48, LDL 95  GERD (ICD-530.81) - mild reflux symptoms.. she uses OTC Prilosec as needed.  Hx of DIVERTICULITIS OF COLON (ICD-562.11) & IBS - she uses MIRALAX daily...s/p right hemicolectomy 10/93 by Lurline Hare... last colonoscopy was 11/07 by DrPatterson showing normal left colon... she's had adhesions and recurrent abd pain due to IBS- eval by GI/ DrPatterson...  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 ~  7/10: eval by DrMacDiarmid for Urology- he rec watchful waiting at this time... ~  12/10: she had f/u DrFontaine for GYN- he plans BMD as well... ~  We don't have any of this data> BMD per DrFontaine/ GYN; GU per DrMacDiarmid & he rec holding off on surg if it isn't bothering her... ~  BMD 1/11 per GYN was wnl w/ TScore -0.8 in left FemNeck... ~  6/13:  Review in EPIC shows 4-5 visits w/ DrFontaine recently & plans for Pessary trial; VitD=38 on OTC supplements.  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 she has been followed by DrPool for neurosurgery- she had a 3 level anterior cervical diskectomy and fusion w/ plating 10/03... 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 She says DrPool has indicated that she needs surg w/ lumbar rods etc but she is holding off... ~  6/13:  She is c/o increased LBP despite Flexeril/ Mobic; we wrote for VICODIN Q8h as needed for severe pain...  ANXIETY (ICD-300.00) - on ALPRAZOLAM 0.5mg  as needed... she was anxious about poss AAA (+ in one of her parents)- we did an AbdSonar 3/09 that was neg.  Health Maintenance - GYN= DrFontaine for PAP, BMD (wnl 1/11), and Mammograms at [facility]... she takes  Calcium, women's MVI, Vit D... She had PNEUMOVAX 11/11 at age 11.   Past Surgical History  Procedure Date  . Right hemicolectomy for diverticulitis with abscess 1993  . Abdominal hysterectomy     TAH  . Resection of colon     BENIGN TUMOR  . Tubal ligation   . Cholecystectomy, laparoscopic   . Cspine surgery     Dr. Estelle Grumbles level ant cerv diskectomy /fusion w/plating    Outpatient Encounter Prescriptions as of 12/15/2011  Medication Sig Dispense Refill  . ALPRAZolam (XANAX) 0.5 MG tablet Take 1/2 to 1 tablet by mouth three times a day as needed for nerves  90  tablet  5  . aspirin 81 MG tablet Take 81 mg by mouth daily.      . B Complex-C (SUPER B COMPLEX PO) Take by mouth daily.        . Calcium Carbonate-Vitamin D (CALTRATE 600+D) 600-400 MG-UNIT per tablet Take 1 tablet by mouth daily.        . Cholecalciferol (VITAMIN D) 2000 UNITS CAPS Take 1 capsule by mouth daily.        . clobetasol cream (TEMOVATE) 0.05 % Apply topically 2 (two) times daily.  30 g  0  . Cranberry 1000 MG CAPS Take 1 capsule by mouth daily.      . diphenhydrAMINE (BENADRYL) 25 MG tablet Take 25 mg by mouth at bedtime as needed.      . fish oil-omega-3 fatty acids 1000 MG capsule Take 2 g by mouth daily.        . fluticasone (FLONASE) 50 MCG/ACT nasal spray Place 2 sprays into the nose at bedtime as needed.  16 g  5  . guaiFENesin (MUCINEX) 600 MG 12 hr tablet Take 1,200 mg by mouth 2 (two) times daily.        Marland Kitchen lisinopril (PRINIVIL,ZESTRIL) 10 MG tablet TAKE 1 TABLET BY MOUTH ONCE A DAY  90 tablet  1  . meloxicam (MOBIC) 15 MG tablet Take 1 tablet (15 mg total) by mouth daily. As needed for arthritis pain  30 tablet  6  . Multiple Vitamins-Minerals (WOMENS MULTIVITAMIN PLUS PO) Take 1 tablet by mouth daily.        Marland Kitchen nystatin-triamcinolone (MYCOLOG II) cream Apply topically 4 (four) times daily.  30 g  2  . polyethylene glycol powder (GLYCOLAX/MIRALAX) powder Take 17 g by mouth daily as needed.        .  verapamil (CALAN-SR) 180 MG CR tablet TAKE 1 TABLET EVERY DAY FOR BP  90 tablet  1  . vitamin B-12 (CYANOCOBALAMIN) 1000 MCG tablet Take 1,000 mcg by mouth daily.        Marland Kitchen DISCONTD: meloxicam (MOBIC) 15 MG tablet Take 15 mg by mouth daily. As needed for arthritis pain       . cyclobenzaprine (FLEXERIL) 5 MG tablet Take 1 tablet (5 mg total) by mouth 3 (three) times daily as needed for muscle spasms.  60 tablet  5  . HYDROcodone-acetaminophen (VICODIN) 5-500 MG per tablet Take 1/2 to 1 tablet by mouth three times daily as needed for pain  90 tablet  5  . DISCONTD: cyclobenzaprine (FLEXERIL) 5 MG tablet Take 1 tablet (5 mg total) by mouth 3 (three) times daily as needed for muscle spasms.  60 tablet  0    Allergies  Allergen Reactions  . Prednisone     REACTION: funny feeling    Current Medications, Allergies, Past Medical History, Past Surgical History, Family History, and Social History were reviewed in [employer] record.   Review of Systems        See HPI - all other systems neg except as noted... The patient complains of headaches.  The patient denies anorexia, fever, weight loss, weight gain, vision loss, decreased hearing, hoarseness, chest pain, syncope, dyspnea on exertion, peripheral edema, prolonged cough, hemoptysis, abdominal pain, melena, hematochezia, severe indigestion/heartburn, hematuria, incontinence, muscle weakness, suspicious skin lesions, transient blindness, difficulty walking, depression, unusual weight change, abnormal bleeding, enlarged lymph nodes, and angioedema.     Objective:   Physical Exam     WD, WN, 66 y/o WF in NAD.Marland KitchenMarland Kitchen  GENERAL:  Alert & oriented; pleasant & cooperative... HEENT:  [place]/AT, EOM-wnl, PERRLA, EACs- min wax, NOSE- neg, THROAT- clear & wnl. NECK:  Supple w/ decrROM & ant cx scar; no JVD; normal carotid impulses w/o bruits; no thyromegaly or nodules palpated; no lymphadenopathy. CHEST:  Clear to P & A; without wheezes/  rales/ or rhonchi heard... HEART:  Regular Rhythm; without murmurs/ rubs/ or gallops detected... ABDOMEN:  Soft & nontender; normal bowel sounds; no organomegaly or masses detected. EXT: without deformities, mild arthritic changes; no varicose veins/ +venous insuffic/ no edema. NEURO:  CN's intact; no focal neuro deficits; normal DTR's... DERM:  No lesions noted; no rash etc...  RADIOLOGY DATA:  Reviewed in the EPIC EMR & discussed w/ the patient...  LABORATORY DATA:  Reviewed in the EPIC EMR & discussed w/ the patient...   Assessment & Plan:   HBP>  Controlled on meds, continue same...  HYPERLIPID>  Stable on diet alone, rec to incr exercise as well...  GI>  GERD, Divertics, IBS>  Stable on Omep + Miralax, etc...  GU>  Pelvic organ prolapse>  eval & management by DrFontaine for GYN (pessary trial pending) & DrMacDiarmid for Urology...  DJD/ Neck & Back Pain>  Managed by DrPool for NS & she is holding off on surg; we filled VICODIN per request...  ANXIETY>  Stable on Alpraz as needed...   Patient's Medications  New Prescriptions   HYDROCODONE-ACETAMINOPHEN (VICODIN) 5-500 MG PER TABLET    Take 1/2 to 1 tablet by mouth three times daily as needed for pain  Previous Medications   ALPRAZOLAM (XANAX) 0.5 MG TABLET    Take 1/2 to 1 tablet by mouth three times a day as needed for nerves   ASPIRIN 81 MG TABLET    Take 81 mg by mouth daily.   B COMPLEX-C (SUPER B COMPLEX PO)    Take by mouth daily.     CALCIUM CARBONATE-VITAMIN D (CALTRATE 600+D) 600-400 MG-UNIT PER TABLET    Take 1 tablet by mouth daily.     CHOLECALCIFEROL (VITAMIN D) 2000 UNITS CAPS    Take 1 capsule by mouth daily.     CLOBETASOL CREAM (TEMOVATE) 0.05 %    Apply topically 2 (two) times daily.   CRANBERRY 1000 MG CAPS    Take 1 capsule by mouth daily.   DIPHENHYDRAMINE (BENADRYL) 25 MG TABLET    Take 25 mg by mouth at bedtime as needed.   FISH OIL-OMEGA-3 FATTY ACIDS 1000 MG CAPSULE    Take 2 g by mouth daily.      FLUTICASONE (FLONASE) 50 MCG/ACT NASAL SPRAY    Place 2 sprays into the nose at bedtime as needed.   GUAIFENESIN (MUCINEX) 600 MG 12 HR TABLET    Take 1,200 mg by mouth 2 (two) times daily.     LISINOPRIL (PRINIVIL,ZESTRIL) 10 MG TABLET    TAKE 1 TABLET BY MOUTH ONCE A DAY   MULTIPLE VITAMINS-MINERALS (WOMENS MULTIVITAMIN PLUS PO)    Take 1 tablet by mouth daily.     NYSTATIN-TRIAMCINOLONE (MYCOLOG II) CREAM    Apply topically 4 (four) times daily.   POLYETHYLENE GLYCOL POWDER (GLYCOLAX/MIRALAX) POWDER    Take 17 g by mouth daily as needed.     VERAPAMIL (CALAN-SR) 180 MG CR TABLET    TAKE 1 TABLET EVERY DAY FOR BP   VITAMIN B-12 (CYANOCOBALAMIN) 1000 MCG TABLET    Take 1,000 mcg by mouth daily.    Modified Medications   Modified Medication Previous Medication  CYCLOBENZAPRINE (FLEXERIL) 5 MG TABLET cyclobenzaprine (FLEXERIL) 5 MG tablet      Take 1 tablet (5 mg total) by mouth 3 (three) times daily as needed for muscle spasms.    Take 1 tablet (5 mg total) by mouth 3 (three) times daily as needed for muscle spasms.   MELOXICAM (MOBIC) 15 MG TABLET meloxicam (MOBIC) 15 MG tablet      Take 1 tablet (15 mg total) by mouth daily. As needed for arthritis pain    Take 15 mg by mouth daily. As needed for arthritis pain   Discontinued Medications   No medications on file

## 2011-12-21 ENCOUNTER — Ambulatory Visit: Payer: Medicare Other | Admitting: Pulmonary Disease

## 2011-12-30 ENCOUNTER — Other Ambulatory Visit: Payer: Self-pay | Admitting: Pulmonary Disease

## 2012-01-06 ENCOUNTER — Other Ambulatory Visit: Payer: Self-pay | Admitting: Pulmonary Disease

## 2012-01-09 DIAGNOSIS — H00029 Hordeolum internum unspecified eye, unspecified eyelid: Secondary | ICD-10-CM | POA: Diagnosis not present

## 2012-01-09 DIAGNOSIS — H04129 Dry eye syndrome of unspecified lacrimal gland: Secondary | ICD-10-CM | POA: Diagnosis not present

## 2012-03-02 ENCOUNTER — Other Ambulatory Visit: Payer: Self-pay | Admitting: Pulmonary Disease

## 2012-03-09 ENCOUNTER — Encounter: Payer: Self-pay | Admitting: Gynecology

## 2012-03-09 ENCOUNTER — Ambulatory Visit (INDEPENDENT_AMBULATORY_CARE_PROVIDER_SITE_OTHER): Payer: Medicare Other | Admitting: Gynecology

## 2012-03-09 DIAGNOSIS — R350 Frequency of micturition: Secondary | ICD-10-CM | POA: Diagnosis not present

## 2012-03-09 DIAGNOSIS — N9489 Other specified conditions associated with female genital organs and menstrual cycle: Secondary | ICD-10-CM | POA: Diagnosis not present

## 2012-03-09 DIAGNOSIS — K469 Unspecified abdominal hernia without obstruction or gangrene: Secondary | ICD-10-CM

## 2012-03-09 DIAGNOSIS — N949 Unspecified condition associated with female genital organs and menstrual cycle: Secondary | ICD-10-CM

## 2012-03-09 LAB — URINALYSIS W MICROSCOPIC + REFLEX CULTURE
Bilirubin Urine: NEGATIVE
Casts: NONE SEEN
Crystals: NONE SEEN
Glucose, UA: NEGATIVE mg/dL
Hgb urine dipstick: NEGATIVE
Ketones, ur: NEGATIVE mg/dL
Nitrite: NEGATIVE
Protein, ur: NEGATIVE mg/dL
RBC / HPF: NONE SEEN RBC/hpf (ref <3)
Specific Gravity, Urine: 1.01 (ref 1.005–1.030)
Urobilinogen, UA: 0.2 mg/dL (ref 0.0–1.0)
pH: 6.5 (ref 5.0–8.0)

## 2012-03-09 LAB — WET PREP FOR TRICH, YEAST, CLUE
Clue Cells Wet Prep HPF POC: NONE SEEN
Trich, Wet Prep: NONE SEEN
Yeast Wet Prep HPF POC: NONE SEEN

## 2012-03-09 MED ORDER — CLOBETASOL PROPIONATE 0.05 % EX CREA: TOPICAL_CREAM | Freq: Two times a day (BID) | CUTANEOUS | Status: AC

## 2012-03-09 NOTE — Progress Notes (Signed)
Patient presents complaining of vulvar burning.  Was evaluated in January with similar complaints and prescribed Temovate cream and Benadryl at bedtime. Probably she never started these. Also has enterocele that we fitted a Gellhorn 2-1/2 pessary but there was some confusion and she never followed up for placement, unsure whether we did not order for her.  Exam with [name] assistant Abdomen soft nontender without masses guarding rebound organomegaly. Pelvic external BUS vagina with atrophic changes. Enterocele noted. Bimanual without masses or tenderness.  Wet prep negative Urinalysis rare bacteria otherwise negative.  Assessment and plan: 1. Vaginal burning. Wet prep/urinalysis negative. We'll follow up with culture and treat if abnormal. I think a lot of this is atrophic changes. Possible low-grade yeast negative on screening. Recommended Temovate 0.05% cream 2 times daily times several days then nightly for one to 2 weeks. Need to avoid prolonged use discussed. If this treats her symptoms have will follow. If not then will rediscuss alternatives. 2. Enterocele. We'll order her Gellhorn 2 1/2 and she will follow up for placement. Stressed the need to call us if she does not hear from [place] in a week.

## 2012-03-09 NOTE — Patient Instructions (Addendum)
Used Temovate cream externally nightly for one to 2 weeks to control itching and use as needed. Office will call you when the pessary is available for placement.

## 2012-03-12 ENCOUNTER — Telehealth: Payer: Self-pay | Admitting: Gynecology

## 2012-03-12 LAB — URINE CULTURE: Colony Count: 50000

## 2012-03-12 MED ORDER — SULFAMETHOXAZOLE-TRIMETHOPRIM 800-160 MG PO TABS: 1.0000 | ORAL_TABLET | Freq: Two times a day (BID) | ORAL | Status: AC

## 2012-03-12 NOTE — Telephone Encounter (Signed)
Pt informed with the below note. 

## 2012-03-12 NOTE — Telephone Encounter (Signed)
Tell patient low level bacteria in urine.  Rx with septra bid x 3

## 2012-03-20 ENCOUNTER — Telehealth: Payer: Self-pay | Admitting: *Deleted

## 2012-03-20 NOTE — Telephone Encounter (Signed)
I think the patient needs to be using her Temovate 0.05% cream externally twice daily for one week and then nightly for another week to see if this doesn't help vaginal burning. I think it's more irritative and not be UTI. Have her use the cream for the next week or so [place] if this doesn't help.

## 2012-03-20 NOTE — Telephone Encounter (Signed)
Pt was giving septra x 3 days 03/12/12, pt c/o vaginal burning after urination. Vaginal burning during night time as well. Pt is requesting more medication. Please advise

## 2012-03-21 NOTE — Telephone Encounter (Signed)
Pt informed with the below note and will try this. 

## 2012-03-28 ENCOUNTER — Telehealth: Payer: Self-pay | Admitting: *Deleted

## 2012-03-28 NOTE — Telephone Encounter (Signed)
After playing phone tag with the patient, we spoke today about the Cost of the pessary and Medicare not paying for it. The Cost is $68.42. And the patient will talk with her husband and let me know. KW

## 2012-04-03 ENCOUNTER — Telehealth: Payer: Self-pay | Admitting: *Deleted

## 2012-04-03 MED ORDER — CLOBETASOL PROPIONATE 0.05 % EX CREA: TOPICAL_CREAM | CUTANEOUS | Status: DC

## 2012-04-03 NOTE — Telephone Encounter (Signed)
Pt completed Rx for temovate 0.05 % as directed her symptoms have resolved. Vaginal comes and goes at times not on a daily basis, she asked if you thought OTC cream TriCalm would be okay for her to use as needed? Please advise

## 2012-04-03 NOTE — Telephone Encounter (Signed)
She can try the OTC cream. I did refill her Temovate cream in case she needs it in the future.

## 2012-04-04 NOTE — Telephone Encounter (Signed)
Pt informed with the below note. 

## 2012-04-05 NOTE — Telephone Encounter (Signed)
The patient called and wants to proceed with the pessary. Apt will be made. KW

## 2012-04-10 ENCOUNTER — Other Ambulatory Visit: Payer: Self-pay | Admitting: Pulmonary Disease

## 2012-04-10 DIAGNOSIS — H01009 Unspecified blepharitis unspecified eye, unspecified eyelid: Secondary | ICD-10-CM | POA: Diagnosis not present

## 2012-04-26 ENCOUNTER — Other Ambulatory Visit: Payer: Self-pay | Admitting: Gynecology

## 2012-04-26 DIAGNOSIS — Z1231 Encounter for screening mammogram for malignant neoplasm of breast: Secondary | ICD-10-CM

## 2012-06-08 ENCOUNTER — Telehealth: Payer: Self-pay | Admitting: Pulmonary Disease

## 2012-06-08 NOTE — Telephone Encounter (Signed)
Called and spoke with pt and she is aware that no lab work is needed at this time.   Pt is aware that we do not give the shingles vaccine at our office and she will discuss this with SN at her next ov.  Nothing further is needed.

## 2012-06-11 ENCOUNTER — Ambulatory Visit (INDEPENDENT_AMBULATORY_CARE_PROVIDER_SITE_OTHER): Payer: Medicare Other | Admitting: Gynecology

## 2012-06-11 ENCOUNTER — Ambulatory Visit (HOSPITAL_COMMUNITY)
Admission: RE | Admit: 2012-06-11 | Discharge: 2012-06-11 | Disposition: A | Discharge status: Home / Self Care | Payer: Medicare Other | Source: Ambulatory Visit | Attending: Gynecology | Admitting: Gynecology

## 2012-06-11 ENCOUNTER — Encounter: Payer: Self-pay | Admitting: Gynecology

## 2012-06-11 ENCOUNTER — Other Ambulatory Visit (HOSPITAL_COMMUNITY)
Admission: RE | Admit: 2012-06-11 | Discharge: 2012-06-11 | Disposition: A | Discharge status: Home / Self Care | Payer: Medicare Other | Source: Ambulatory Visit | Attending: Gynecology | Admitting: Gynecology

## 2012-06-11 VITALS — BP 140/74 | Ht 60.5 in | Wt 160.0 lb

## 2012-06-11 DIAGNOSIS — N816 Rectocele: Secondary | ICD-10-CM

## 2012-06-11 DIAGNOSIS — Z1272 Encounter for screening for malignant neoplasm of vagina: Secondary | ICD-10-CM

## 2012-06-11 DIAGNOSIS — N8111 Cystocele, midline: Secondary | ICD-10-CM

## 2012-06-11 DIAGNOSIS — K469 Unspecified abdominal hernia without obstruction or gangrene: Secondary | ICD-10-CM | POA: Diagnosis not present

## 2012-06-11 DIAGNOSIS — Z124 Encounter for screening for malignant neoplasm of cervix: Secondary | ICD-10-CM | POA: Insufficient documentation

## 2012-06-11 DIAGNOSIS — Z1231 Encounter for screening mammogram for malignant neoplasm of breast: Secondary | ICD-10-CM | POA: Diagnosis not present

## 2012-06-11 DIAGNOSIS — N952 Postmenopausal atrophic vaginitis: Secondary | ICD-10-CM

## 2012-06-11 DIAGNOSIS — R82998 Other abnormal findings in urine: Secondary | ICD-10-CM | POA: Diagnosis not present

## 2012-06-11 NOTE — Progress Notes (Signed)
Alison Cuevas 10/14/1945 [id number]        66 y.o.  G4P0013 for follow up of her prolapse.  Past medical history,surgical history, medications, allergies, family history and social history were all reviewed and documented in the EPIC chart. ROS:  Was performed and pertinent positives and negatives are included in the history.  Exam: Sherrilyn Rist assistant Filed Vitals:   06/11/12 1011  BP: 140/74  Height: 5' 0.5" (1.537 m)  Weight: 160 lb (72.576 kg)   General appearance  Normal Skin grossly normal Head/Neck normal with no cervical or supraclavicular adenopathy thyroid normal Lungs  clear Cardiac RR, without RMG Abdominal  soft, nontender, without masses, organomegaly or hernia Breasts  examined lying and sitting without masses, retractions, discharge or axillary adenopathy. Pelvic  Ext/BUS/vagina  First second degree cystocele second to third degree enterocele second degree rectocele noted. Bimanual without masses or tenderness  Adnexa  Without masses or tenderness    Anus and perineum  normal   Rectovaginal  normal sphincter tone without palpated masses or tenderness.    Assessment/Plan:  66 y.o. G4P0013 female for annual exam.   1. Enterocele/rectocele/cystocele.  Patient previously fitted for Gellhorn 2-1/2 pessary. This was placed today and patient did well with this. We'll follow up in 3-4 weeks for recheck. Instructions about care were reviewed with her and beginners kit given. 2. Atrophic vaginitis. Patient asymptomatic other than above and we'll continue to monitor. 3. Mammography today. We'll continue with annual mammography. SBE monthly reviewed. 4. DEXA 06/2009 normal. Repeat at 5 your interval. Increase calcium vitamin D reviewed. 5. Pap smear 05/2010. Pap smear done today at patient request. I reviewed current screening guidelines. She has no history of abnormal Pap smears and is status post hysterectomy over the age of 75 and we can stop screening. Again patient very strongly  wanted Pap smear and we did this today. 6. Colonoscopy. Patient think she is due now and will follow up with her primary to arrange. 7. Health maintenance. No lab work done as it is all done through her primary physician's office who she sees on a regular basis. Follow up in 3-4 weeks for pessary check.    Dara Lords MD, 11:31 AM 06/11/2012

## 2012-06-11 NOTE — Patient Instructions (Signed)
Follow up in 3-4 weeks for pessary recheck.

## 2012-06-12 ENCOUNTER — Telehealth: Payer: Self-pay | Admitting: *Deleted

## 2012-06-12 LAB — URINALYSIS W MICROSCOPIC + REFLEX CULTURE
Bacteria, UA: NONE SEEN
Bilirubin Urine: NEGATIVE
Casts: NONE SEEN
Crystals: NONE SEEN
Glucose, UA: NEGATIVE mg/dL
Hgb urine dipstick: NEGATIVE
Ketones, ur: NEGATIVE mg/dL
Nitrite: NEGATIVE
Protein, ur: NEGATIVE mg/dL
Specific Gravity, Urine: 1.01 (ref 1.005–1.030)
Squamous Epithelial / LPF: NONE SEEN
Urobilinogen, UA: 0.2 mg/dL (ref 0.0–1.0)
pH: 6 (ref 5.0–8.0)

## 2012-06-12 NOTE — Telephone Encounter (Signed)
Pt had pessary placement yesterday, she said about 2:00 am last night she had some terrible back pain and went to bathroom and her pessary was hanging out. Pt said no burning with urination, pt said she just pulled the pessary out. I told pt that she would need an appointment to have replaced. Pt said that you told her if it should fall out that she could just insert pessary back? I told her I would check with you about this information and call her back. Please advise

## 2012-06-12 NOTE — Telephone Encounter (Signed)
We talked about the way to replace it and she can try, if she has any problems with this then office visit.

## 2012-06-12 NOTE — Telephone Encounter (Signed)
Pt informed with the below note and will call back if problems.

## 2012-06-13 ENCOUNTER — Encounter: Payer: Self-pay | Admitting: *Deleted

## 2012-06-14 ENCOUNTER — Ambulatory Visit: Payer: Medicare Other | Admitting: Gynecology

## 2012-06-14 ENCOUNTER — Telehealth: Payer: Self-pay | Admitting: *Deleted

## 2012-06-14 ENCOUNTER — Ambulatory Visit (INDEPENDENT_AMBULATORY_CARE_PROVIDER_SITE_OTHER): Payer: Medicare Other | Admitting: Pulmonary Disease

## 2012-06-14 ENCOUNTER — Encounter: Payer: Self-pay | Admitting: Pulmonary Disease

## 2012-06-14 VITALS — BP 124/82 | HR 60 | Temp 97.5°F | Ht 61.5 in | Wt 155.8 lb

## 2012-06-14 DIAGNOSIS — R531 Weakness: Secondary | ICD-10-CM

## 2012-06-14 DIAGNOSIS — N819 Female genital prolapse, unspecified: Secondary | ICD-10-CM

## 2012-06-14 DIAGNOSIS — L821 Other seborrheic keratosis: Secondary | ICD-10-CM | POA: Diagnosis not present

## 2012-06-14 DIAGNOSIS — E7800 Pure hypercholesterolemia, unspecified: Secondary | ICD-10-CM | POA: Diagnosis not present

## 2012-06-14 DIAGNOSIS — I10 Essential (primary) hypertension: Secondary | ICD-10-CM | POA: Diagnosis not present

## 2012-06-14 DIAGNOSIS — K219 Gastro-esophageal reflux disease without esophagitis: Secondary | ICD-10-CM | POA: Diagnosis not present

## 2012-06-14 DIAGNOSIS — M1990 Unspecified osteoarthritis, unspecified site: Secondary | ICD-10-CM

## 2012-06-14 DIAGNOSIS — T7840XA Allergy, unspecified, initial encounter: Secondary | ICD-10-CM | POA: Diagnosis not present

## 2012-06-14 DIAGNOSIS — K5732 Diverticulitis of large intestine without perforation or abscess without bleeding: Secondary | ICD-10-CM

## 2012-06-14 DIAGNOSIS — L723 Sebaceous cyst: Secondary | ICD-10-CM | POA: Diagnosis not present

## 2012-06-14 DIAGNOSIS — M545 Low back pain, unspecified: Secondary | ICD-10-CM

## 2012-06-14 DIAGNOSIS — F411 Generalized anxiety disorder: Secondary | ICD-10-CM

## 2012-06-14 MED ORDER — VERAPAMIL HCL ER 180 MG PO TBCR: 180.0000 mg | EXTENDED_RELEASE_TABLET | Freq: Every day | ORAL | Status: DC

## 2012-06-14 MED ORDER — MELOXICAM 15 MG PO TABS: 15.0000 mg | ORAL_TABLET | Freq: Every day | ORAL | Status: DC | PRN

## 2012-06-14 MED ORDER — LISINOPRIL 10 MG PO TABS: 10.0000 mg | ORAL_TABLET | Freq: Every day | ORAL | Status: DC

## 2012-06-14 MED ORDER — CIPROFLOXACIN HCL 250 MG PO TABS: 250.0000 mg | ORAL_TABLET | Freq: Two times a day (BID) | ORAL | Status: DC

## 2012-06-14 NOTE — Progress Notes (Signed)
Subjective:    Patient ID: Alison Cuevas, female    DOB: 1946-02-28, 66 y.o.   MRN: [id number]  HPI 66 y/o WF here for a follow up office visit... he has multiple medical problems as noted below...   ~  Jan11:  generally stable but under alot of stress- Alpraz helps... she is concerned about sinuses & saw [facility] who thought her symptoms were stress related and didn't do anything... offered her CT Sinus but she wants to wait, and in the meanwhile she will Rx w/ Saline, Mucinex, Flonase... BP is controlled on Rx;  on diet alone for Chol & wt up 3# this yr;  persist prob w/ pelvic prolapse Rx by DrFontaine recently & he has BMD scheduled as well...  she will ret for FASTING labs soon>>  ~  Nov 16, 2010:  49mo ROV & her CC remains her LBP w/ eval & rx by DrPool, she says she needs rods etc but she is holding off;  She & [name]") just celebrated their 50th anniversary;  She saw TP 11/11 & had meds refilled> note reviewed, given Pneumovax;  See prob list below & fasting labs>>  ~  May 12, 2011:  83mo ROV & she saw TP last mo w/ decr energy, tired all the time, aching/ sore, difficulty resting, etc; felt to have a FM flair & given Flexeril Tid & it is helping some she says; still w/ chr LBP followed by DrPool on Tylenol/ Mobic... BP remains well regulated on her meds;  Chol has been reasonable on her diet management;  GI remains stable per DrPatterson & GU managed by DrFontaine & DrMacDiarmid (see below);  We reviewed her prev labs and radiologic data...  ~  December 15, 2011:  941mo ROV & Miyah has had 4-5 visits w/ GYN, DrFontaine for her organ prolapse w/ cystocele, enterocele, rectocele; they have decided on pessary rx & she was fitted- now waiting on the device to arrive for insertion;  She remains under stress w/ "Pete's" medical issues, back pain, etc... Allergies> on OTC antihist, Benedryl, Flonase, Mucinex, etc... HBP> on Lisin10, Verap180; BP= 122/68 and she denies CP, palpit, SOB, edema,  etc... Chol> on diet + FishOil; f/u FLP last week showed TChol 161, TG 93, HDL 48, LDL 95 GI- GERD. Divertics> on Miralax; she had a right hemicolectomy in 1993, last colon was 11/07 by DrPatterson w/ normal left colon... GYN- pelvic organ prolapse> per DrFontaine as noted... DJD, FM, neck&back pain> on Flex5Tid & Mobic15 as needed; she requests stronger prn pain med & we wrote for Vicodin Q8h prn... Anxiety> she is under a lot of stress; uses Xanax 0.5mg  prn...    We reviewed prob list, meds, xrays and labs> see below>> LABS 6/13:  FLP- at goals on diet;  Chems- wnl;  CBC- wnl;  TSH=1.32;  VitD=38 on 2000u daily...  ~  June 14, 2012:  83mo ROV & Alison Cuevas is under treatment for a UTI now from her GYN; notes excess sleepy & no energy; otherw stable, doing well & requests Rx for Shingles vaccine...  We reviewed the following medical problems during today's office visit >>      Allergies> on OTC antihist, Benedryl, Flonase, Mucinex, etc...    HBP> on Lisin10, Verap180; BP= 124/82 and she denies CP, palpit, SOB, edema, etc...    Chol> on diet + FishOil; f/u FLP 6/13 showed TChol 161, TG 93, HDL 48, LDL 95    GI- GERD. Divertics> on Miralax; she had a  right hemicolectomy in 1993, last colon was 11/07 by DrPatterson w/ normal left colon...    GYN- pelvic organ prolapse> per DrFontaine as noted he was treating her w/ a pessary...    DJD, FM, neck&back pain> on Flex5Tid & Mobic15 as needed; she requested stronger prn pain med & we wrote for Vicodin Q8h prn...    Anxiety> she is under a lot of stress; uses Xanax 0.5mg  prn...  We reviewed prob list, meds, xrays and labs> see below for updates >> she declines the 2013 Flu vaccine; requests several refill presecriptions...           Problem List:   ALLERGY (ICD-995.3) - she has allergic rhinitis symptoms and increased difficulty w/ the warmer weather... she's been on LORATIDINE 10mg /d, plus saline nasal spray during the day, and FLONASE in each nostril  Qhs... she uses Mucinex OTC as needed and had DrCNewman wash out her ears... ~  f/u eval DrCNewman 12/10 for "crazy feelin in my head"- rec continued Saline lavage, Flonase, etc...  HYPERTENSION (ICD-401.9) - controlled on VERAPAMIL SR 180mg /d + LISINOPRIL 10mg /d...  ~  CXR 3/10 showed normal heart size, clear lungs, s/p CSpine fusion... ~  1/11:  BP=128/70 and similar at home; denies HA, visual changes, CP, palipit, dizziness, syncope, dyspnea, edema, etc... both meds are on the [facility] $4 list and she is pleased w/ the results... ~  5/12:  BP= 112/74 & she remains asymptomatic... ~  11/12:  BP= 126/70 & she will continue diet & same meds... ~  6/13:  BP= 122/68 and she denies CP, palpit, SOB, edema, etc...   HYPERCHOLESTEROLEMIA (ICD-272.0) - on diet alone... ~  FLP 3/09 showed Tchol 202, Tg 56, HDL 44, LDL 137... rec- diet efforts & get wt down... ~  FLP 9/09 showed TChol 183, TG 78, HDL 37, LDL 131... ~  FLP 3/10 (wt=146#) showed TChol 169, TG 47, HDL 51, LDL 108... improved on diet Rx... ~  FLP 1/11 (wt=149#) showed TChol 172, TG 66, HDL 46, LDL 113 ~  FLP 5/12 (wt=156#) showed TChol 168, TG 70, HDL 48, LDL 106 ~  FLP 6/13 (wt=156#) showed TChol 161, TG 93, HDL 48, LDL 95  GERD (ICD-530.81) - mild reflux symptoms.. she uses OTC Prilosec as needed.  Hx of DIVERTICULITIS OF COLON (ICD-562.11) & IBS - she uses MIRALAX daily...s/p right hemicolectomy 10/93 by Lurline Hare... last colonoscopy was 11/07 by DrPatterson showing normal left colon... she's had adhesions and recurrent abd pain due to IBS- eval by GI/ DrPatterson...  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 ~  7/10: eval by DrMacDiarmid for Urology- he rec watchful waiting at this time... ~  12/10: she had f/u DrFontaine for GYN- he plans BMD as well... ~  We don't have any of this data> BMD per DrFontaine/ GYN; GU per DrMacDiarmid & he rec holding  off on surg if it isn't bothering her... ~  BMD 1/11 per GYN was wnl w/ TScore -0.8 in left FemNeck... ~  6/13:  Review in EPIC shows 4-5 visits w/ DrFontaine recently & plans for Pessary trial; VitD=38 on OTC supplements. ~  12/13: note from DrFontaine- enterocele/ rectocele/ cystocele- pessary replaced...  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 she has been followed by DrPool for neurosurgery- she had a 3  level anterior cervical diskectomy and fusion w/ plating 10/03... 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 She says DrPool has indicated that she needs surg w/ lumbar rods etc but she is holding off... ~  1/11:  BMD from DrFontaine showed TScores 0.0 in Spine & -0.8 in left FemNeck... ~  6/13:  She is c/o increased LBP despite Flexeril/ Mobic; we wrote for VICODIN Q8h as needed for severe pain...  ANXIETY (ICD-300.00) - on ALPRAZOLAM 0.5mg  as needed... she was anxious about poss AAA (+ in one of her parents)- we did an AbdSonar 3/09 that was neg.  Health Maintenance - GYN= DrFontaine for PAP, BMD (wnl 1/11), and Mammograms at [facility]... she takes Calcium, women's MVI, Vit D... She had PNEUMOVAX 11/11 at age 47.   Past Surgical History  Procedure Date  . Right hemicolectomy for diverticulitis with abscess 1993  . Abdominal hysterectomy     TAH  . Resection of colon     BENIGN TUMOR  . Tubal ligation   . Cholecystectomy, laparoscopic   . Cspine surgery     Dr. Estelle Grumbles level ant cerv diskectomy /fusion w/plating    Outpatient Encounter Prescriptions as of 06/14/2012  Medication Sig Dispense Refill  . ALPRAZolam (XANAX) 0.5 MG tablet TAKE 1/2-1 TABLET BY MOUTH 3 TIMES A DAY AS NEEDED FOR NERVES  90 tablet  5  . aspirin 81 MG tablet Take 81 mg by mouth daily.      . B Complex-C (SUPER B COMPLEX PO) Take by mouth daily.         . Calcium Carbonate-Vitamin D (CALTRATE 600+D) 600-400 MG-UNIT per tablet Take 1 tablet by mouth daily.        . Cholecalciferol (VITAMIN D) 2000 UNITS CAPS Take 1 capsule by mouth daily.        . ciprofloxacin (CIPRO) 250 MG tablet Take 1 tablet (250 mg total) by mouth 2 (two) times daily. For 3 days  6 tablet  0  . clobetasol cream (TEMOVATE) 0.05 % Apply topically 2 (two) times daily.  30 g  1  . Cranberry 1000 MG CAPS Take 1 capsule by mouth daily.      . cyclobenzaprine (FLEXERIL) 5 MG tablet Take 1 tablet (5 mg total) by mouth 3 (three) times daily as needed for muscle spasms.  60 tablet  5  . diphenhydrAMINE (BENADRYL) 25 MG tablet Take 25 mg by mouth at bedtime as needed.      . fish oil-omega-3 fatty acids 1000 MG capsule Take 2 g by mouth daily.        . fluticasone (FLONASE) 50 MCG/ACT nasal spray Place 2 sprays into the nose at bedtime as needed.  16 g  5  . guaiFENesin (MUCINEX) 600 MG 12 hr tablet Take 1,200 mg by mouth 2 (two) times daily.        Marland Kitchen HYDROcodone-acetaminophen (VICODIN) 5-500 MG per tablet Take 1/2 to 1 tablet by mouth three times daily as needed for pain  90 tablet  5  . lisinopril (PRINIVIL,ZESTRIL) 10 MG tablet TAKE 1 TABLET BY MOUTH ONCE A DAY  90 tablet  1  . meloxicam (MOBIC) 15 MG tablet TAKE 1 TABLET EVERY DAY AS NEEDED FOR ARTHRITIS PAIN  90 tablet  1  . Multiple Vitamins-Minerals (WOMENS MULTIVITAMIN PLUS PO) Take 1 tablet by mouth daily.        Marland Kitchen nystatin-triamcinolone (MYCOLOG II) cream Apply topically 4 (four) times daily.  30 g  2  .  polyethylene glycol powder (GLYCOLAX/MIRALAX) powder Take 17 g by mouth daily as needed.        . verapamil (CALAN-SR) 180 MG CR tablet TAKE 1 TABLET EVERY DAY FOR BP  90 tablet  1  . vitamin B-12 (CYANOCOBALAMIN) 1000 MCG tablet Take 1,000 mcg by mouth daily.        Marland Kitchen VITAMIN E PO Take by mouth.        Allergies  Allergen Reactions  . Prednisone     REACTION: funny feeling    Current Medications, Allergies,  Past Medical History, Past Surgical History, Family History, and Social History were reviewed in [employer] record.   Review of Systems        See HPI - all other systems neg except as noted... The patient complains of headaches.  The patient denies anorexia, fever, weight loss, weight gain, vision loss, decreased hearing, hoarseness, chest pain, syncope, dyspnea on exertion, peripheral edema, prolonged cough, hemoptysis, abdominal pain, melena, hematochezia, severe indigestion/heartburn, hematuria, incontinence, muscle weakness, suspicious skin lesions, transient blindness, difficulty walking, depression, unusual weight change, abnormal bleeding, enlarged lymph nodes, and angioedema.     Objective:   Physical Exam     WD, WN, 66 y/o WF in NAD... GENERAL:  Alert & oriented; pleasant & cooperative... HEENT:  [place]/AT, EOM-wnl, PERRLA, EACs- min wax, NOSE- neg, THROAT- clear & wnl. NECK:  Supple w/ decrROM & ant cx scar; no JVD; normal carotid impulses w/o bruits; no thyromegaly or nodules palpated; no lymphadenopathy. CHEST:  Clear to P & A; without wheezes/ rales/ or rhonchi heard... HEART:  Regular Rhythm; without murmurs/ rubs/ or gallops detected... ABDOMEN:  Soft & nontender; normal bowel sounds; no organomegaly or masses detected. EXT: without deformities, mild arthritic changes; no varicose veins/ +venous insuffic/ no edema. NEURO:  CN's intact; no focal neuro deficits; normal DTR's... DERM:  No lesions noted; no rash etc...  RADIOLOGY DATA:  Reviewed in the EPIC EMR & discussed w/ the patient...  LABORATORY DATA:  Reviewed in the EPIC EMR & discussed w/ the patient...   Assessment & Plan:    HBP>  Controlled on meds, continue same...  HYPERLIPID>  Stable on diet alone, rec to incr exercise as well...  GI>  GERD, Divertics, IBS>  Stable on Omep + Miralax, etc...  GU>  Pelvic organ prolapse>  eval & management by DrFontaine for GYN (pessary trial  ongoing) & DrMacDiarmid for Urology...  DJD/ Neck & Back Pain>  Managed by DrPool for NS & she is holding off on surg; we filled VICODIN per request...  ANXIETY>  Stable on Alpraz as needed...   Patient's Medications  New Prescriptions   No medications on file  Previous Medications   ALPRAZOLAM (XANAX) 0.5 MG TABLET    TAKE 1/2-1 TABLET BY MOUTH 3 TIMES A DAY AS NEEDED FOR NERVES   ASPIRIN 81 MG TABLET    Take 81 mg by mouth daily.   B COMPLEX-C (SUPER B COMPLEX PO)    Take by mouth daily.     CALCIUM CARBONATE-VITAMIN D (CALTRATE 600+D) 600-400 MG-UNIT PER TABLET    Take 1 tablet by mouth daily.     CHOLECALCIFEROL (VITAMIN D) 2000 UNITS CAPS    Take 1 capsule by mouth daily.     CIPROFLOXACIN (CIPRO) 250 MG TABLET    Take 1 tablet (250 mg total) by mouth 2 (two) times daily. For 3 days   CLOBETASOL CREAM (TEMOVATE) 0.05 %    Apply topically 2 (  two) times daily.   CRANBERRY 1000 MG CAPS    Take 1 capsule by mouth daily.   CYCLOBENZAPRINE (FLEXERIL) 5 MG TABLET    Take 1 tablet (5 mg total) by mouth 3 (three) times daily as needed for muscle spasms.   DIPHENHYDRAMINE (BENADRYL) 25 MG TABLET    Take 25 mg by mouth at bedtime as needed.   FISH OIL-OMEGA-3 FATTY ACIDS 1000 MG CAPSULE    Take 2 g by mouth daily.     FLUTICASONE (FLONASE) 50 MCG/ACT NASAL SPRAY    Place 2 sprays into the nose at bedtime as needed.   GUAIFENESIN (MUCINEX) 600 MG 12 HR TABLET    Take 1,200 mg by mouth 2 (two) times daily.     HYDROCODONE-ACETAMINOPHEN (VICODIN) 5-500 MG PER TABLET    Take 1/2 to 1 tablet by mouth three times daily as needed for pain   MULTIPLE VITAMINS-MINERALS (WOMENS MULTIVITAMIN PLUS PO)    Take 1 tablet by mouth daily.     NYSTATIN-TRIAMCINOLONE (MYCOLOG II) CREAM    Apply topically 4 (four) times daily.   POLYETHYLENE GLYCOL POWDER (GLYCOLAX/MIRALAX) POWDER    Take 17 g by mouth daily as needed.     VITAMIN B-12 (CYANOCOBALAMIN) 1000 MCG TABLET    Take 1,000 mcg by mouth daily.     VITAMIN  E PO    Take by mouth.  Modified Medications   Modified Medication Previous Medication   LISINOPRIL (PRINIVIL,ZESTRIL) 10 MG TABLET lisinopril (PRINIVIL,ZESTRIL) 10 MG tablet      Take 1 tablet (10 mg total) by mouth daily.    TAKE 1 TABLET BY MOUTH ONCE A DAY   LISINOPRIL (PRINIVIL,ZESTRIL) 10 MG TABLET lisinopril (PRINIVIL,ZESTRIL) 10 MG tablet      TAKE 1 TABLET BY MOUTH ONCE A DAY    TAKE 1 TABLET BY MOUTH ONCE A DAY   MELOXICAM (MOBIC) 15 MG TABLET meloxicam (MOBIC) 15 MG tablet      Take 1 tablet (15 mg total) by mouth daily as needed for pain.    TAKE 1 TABLET EVERY DAY AS NEEDED FOR ARTHRITIS PAIN   VERAPAMIL (CALAN-SR) 180 MG CR TABLET verapamil (CALAN-SR) 180 MG CR tablet      Take 1 tablet (180 mg total) by mouth at bedtime.    TAKE 1 TABLET EVERY DAY FOR BP  Discontinued Medications   No medications on file

## 2012-06-14 NOTE — Patient Instructions (Addendum)
Today we updated your med list in our EPIC system...    Continue your current medications the same...    We refilled the meds you requested...  We wrote prescriptions for your Shingles vaccine per request...  Stay as active as poss, and endeavor to get a good night's sleep every night...  Call for any questions or if we can be of service in any way...  Let's plan a follow up visit in another 6 months w/ FASTING blood work at that time.Marland KitchenMarland Kitchen

## 2012-06-14 NOTE — Telephone Encounter (Signed)
Tell patient she has lobar bacteria in her urine and I want to treat her with ciprofloxacin 250 mg twice daily for 3 days.

## 2012-06-14 NOTE — Telephone Encounter (Signed)
Follow up telephone encounter 06/12/12, pt has appointment today to have pessary replaced at 2:30 pm. Pt said she just found out her family is flying in early today, she asked if okay if she could cancel appointment for today and come on Jan 3rd to have pessary placed? Jan 3rd is the original pessary recheck date. Please advise

## 2012-06-14 NOTE — Telephone Encounter (Signed)
Whenever convenient for her

## 2012-06-14 NOTE — Telephone Encounter (Signed)
Pt informed with the below note. 

## 2012-06-15 LAB — URINE CULTURE: Colony Count: 85000

## 2012-06-29 ENCOUNTER — Ambulatory Visit: Payer: Medicare Other | Admitting: Gynecology

## 2012-06-29 ENCOUNTER — Encounter: Payer: Self-pay | Admitting: Gynecology

## 2012-06-29 ENCOUNTER — Ambulatory Visit (INDEPENDENT_AMBULATORY_CARE_PROVIDER_SITE_OTHER): Payer: Medicare Other | Admitting: Gynecology

## 2012-06-29 DIAGNOSIS — K469 Unspecified abdominal hernia without obstruction or gangrene: Secondary | ICD-10-CM | POA: Diagnosis not present

## 2012-06-29 NOTE — Patient Instructions (Signed)
Call me in one week to let me know how you're doing with your pessary. Call sooner if any issues.

## 2012-06-29 NOTE — Progress Notes (Signed)
Patient presents having recently had a pessary placed. She notes getting up in the middle the night a day or so after placement and apparently the pessary had shifted and she had problems voiding. Doing well now. She does note some intermittent lower abdominal discomfort that comes and goes but nothing consistent.  Exam was kim assistant External BUS vagina with atrophic changes and enterocele grade 2. Bladder appears fairly well supported. Without significant rectocele. Bimanual without masses or tenderness. Rectovaginal exam confirms.  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 Patient will leave urine specimen to make sure she does not have underlying bacteria. Will call me in a week to see how she is doing sooner if any issues.

## 2012-06-30 LAB — URINALYSIS W MICROSCOPIC + REFLEX CULTURE
Bacteria, UA: NONE SEEN
Bilirubin Urine: NEGATIVE
Casts: NONE SEEN
Crystals: NONE SEEN
Glucose, UA: NEGATIVE mg/dL
Hgb urine dipstick: NEGATIVE
Ketones, ur: NEGATIVE mg/dL
Leukocytes, UA: NEGATIVE
Nitrite: NEGATIVE
Protein, ur: NEGATIVE mg/dL
Specific Gravity, Urine: 1.008 (ref 1.005–1.030)
Squamous Epithelial / LPF: NONE SEEN
Urobilinogen, UA: 0.2 mg/dL (ref 0.0–1.0)
pH: 7.5 (ref 5.0–8.0)

## 2012-07-01 ENCOUNTER — Other Ambulatory Visit: Payer: Self-pay | Admitting: Pulmonary Disease

## 2012-07-02 ENCOUNTER — Telehealth: Payer: Self-pay | Admitting: *Deleted

## 2012-07-02 NOTE — Telephone Encounter (Signed)
Pt states was to call you to let you know how the pessary was doin. She states the pessary fell out on Sat. She knows you are not in the office til 07/09/12 [facility]

## 2012-07-03 NOTE — Telephone Encounter (Signed)
Recommend trying the next larger size gelhorn 3.0 if she is willing to try.  If so have kim check to see if we have one, if not order.

## 2012-07-06 NOTE — Telephone Encounter (Signed)
LM for pt to call back KW 

## 2012-07-09 NOTE — Telephone Encounter (Signed)
Alison Cuevas spoke with patient regarding the below and she was informed, but patient would like to check with her insurance company first before scheduling appointment.

## 2012-07-23 ENCOUNTER — Telehealth: Payer: Self-pay | Admitting: Pulmonary Disease

## 2012-07-23 MED ORDER — AMOXICILLIN-POT CLAVULANATE 875-125 MG PO TABS: 1.0000 | ORAL_TABLET | Freq: Two times a day (BID) | ORAL | Status: DC

## 2012-07-23 NOTE — Telephone Encounter (Signed)
Per SN---ok to send in augmentin 875 mg  #14  1 po bid and take align once daily, mucinex otc 2 po bid with plenty of fluids. thanks

## 2012-07-23 NOTE — Telephone Encounter (Signed)
Pt c/o sinus drainage(Green, bloody) for several days.  Headaches and sinus congestion.  Pt taking Mucinex, nasal saline and Flonase.  Please advise. Allergies  Allergen Reactions  . Prednisone     REACTION: funny feeling

## 2012-07-23 NOTE — Telephone Encounter (Signed)
LMTCB

## 2012-07-23 NOTE — Telephone Encounter (Signed)
Spoke with pt and notified of recs per SN She verbalized understanding and states nothing further needed Rx was sent to pharm  

## 2012-08-31 ENCOUNTER — Other Ambulatory Visit: Payer: Self-pay | Admitting: Pulmonary Disease

## 2012-09-09 ENCOUNTER — Other Ambulatory Visit: Payer: Self-pay | Admitting: Pulmonary Disease

## 2012-09-10 ENCOUNTER — Other Ambulatory Visit: Payer: Self-pay | Admitting: Pulmonary Disease

## 2012-09-10 MED ORDER — HYDROCODONE-ACETAMINOPHEN 5-325 MG PO TABS: ORAL_TABLET | ORAL | Status: DC

## 2012-09-14 ENCOUNTER — Telehealth: Payer: Self-pay | Admitting: Pulmonary Disease

## 2012-09-14 MED ORDER — AZITHROMYCIN 250 MG PO TABS: ORAL_TABLET | ORAL | Status: DC

## 2012-09-14 NOTE — Telephone Encounter (Signed)
I spoke with pt and she c/o puffy eyes, HA, facial pressure, nasal congestion, blowing out thick mucus mixed with stringy blood. No f/c/s/n/v. She is using sally and taking mucinex. Requesting ZPAK. She has already been on round of augmentin. Please advise SN thanks Last OV 06/14/12 Pending OV 12/12/12 Allergies  Allergen Reactions  . Prednisone     REACTION: funny feeling

## 2012-09-14 NOTE — Telephone Encounter (Signed)
Per SN----  zpak #1  Take as directed mucinex 2 po bid Fluids Nasal saline spray

## 2012-09-14 NOTE — Telephone Encounter (Signed)
I spoke with pt and is aware of SN recs. She voiced her understanding. rx has been sent.

## 2012-09-23 ENCOUNTER — Other Ambulatory Visit: Payer: Self-pay | Admitting: Pulmonary Disease

## 2012-11-21 ENCOUNTER — Other Ambulatory Visit: Payer: Self-pay | Admitting: Neurosurgery

## 2012-11-21 DIAGNOSIS — M545 Low back pain, unspecified: Secondary | ICD-10-CM

## 2012-12-02 ENCOUNTER — Other Ambulatory Visit: Payer: Self-pay | Admitting: Pulmonary Disease

## 2012-12-05 ENCOUNTER — Telehealth: Payer: Self-pay | Admitting: Pulmonary Disease

## 2012-12-05 NOTE — Telephone Encounter (Signed)
Refills have been called to the pharmacy for the pt. Nothing further is needed.

## 2012-12-12 ENCOUNTER — Other Ambulatory Visit (INDEPENDENT_AMBULATORY_CARE_PROVIDER_SITE_OTHER): Payer: Medicare Other

## 2012-12-12 ENCOUNTER — Encounter: Payer: Self-pay | Admitting: Pulmonary Disease

## 2012-12-12 ENCOUNTER — Ambulatory Visit (INDEPENDENT_AMBULATORY_CARE_PROVIDER_SITE_OTHER): Payer: Medicare Other | Admitting: Pulmonary Disease

## 2012-12-12 VITALS — BP 120/70 | HR 51 | Temp 98.3°F | Ht 60.5 in | Wt 155.2 lb

## 2012-12-12 DIAGNOSIS — M1990 Unspecified osteoarthritis, unspecified site: Secondary | ICD-10-CM

## 2012-12-12 DIAGNOSIS — K5732 Diverticulitis of large intestine without perforation or abscess without bleeding: Secondary | ICD-10-CM | POA: Diagnosis not present

## 2012-12-12 DIAGNOSIS — E7800 Pure hypercholesterolemia, unspecified: Secondary | ICD-10-CM

## 2012-12-12 DIAGNOSIS — F411 Generalized anxiety disorder: Secondary | ICD-10-CM | POA: Diagnosis not present

## 2012-12-12 DIAGNOSIS — T7840XA Allergy, unspecified, initial encounter: Secondary | ICD-10-CM

## 2012-12-12 DIAGNOSIS — N819 Female genital prolapse, unspecified: Secondary | ICD-10-CM

## 2012-12-12 DIAGNOSIS — M545 Low back pain, unspecified: Secondary | ICD-10-CM

## 2012-12-12 DIAGNOSIS — I10 Essential (primary) hypertension: Secondary | ICD-10-CM

## 2012-12-12 DIAGNOSIS — K219 Gastro-esophageal reflux disease without esophagitis: Secondary | ICD-10-CM

## 2012-12-12 LAB — HEPATIC FUNCTION PANEL
ALT: 24 U/L (ref 0–35)
AST: 27 U/L (ref 0–37)
Albumin: 4.1 g/dL (ref 3.5–5.2)
Alkaline Phosphatase: 57 U/L (ref 39–117)
Bilirubin, Direct: 0.1 mg/dL (ref 0.0–0.3)
Total Bilirubin: 1 mg/dL (ref 0.3–1.2)
Total Protein: 7.1 g/dL (ref 6.0–8.3)

## 2012-12-12 LAB — BASIC METABOLIC PANEL
BUN: 19 mg/dL (ref 6–23)
CO2: 26 mEq/L (ref 19–32)
Calcium: 9 mg/dL (ref 8.4–10.5)
Chloride: 100 mEq/L (ref 96–112)
Creatinine, Ser: 1 mg/dL (ref 0.4–1.2)
GFR: 61.39 mL/min (ref >60.00)
Glucose, Bld: 103 mg/dL — ABNORMAL HIGH (ref 70–99)
Potassium: 4.2 mEq/L (ref 3.5–5.1)
Sodium: 135 mEq/L (ref 135–145)

## 2012-12-12 LAB — CBC WITH DIFFERENTIAL/PLATELET
Basophils Absolute: 0 10*3/uL (ref 0.0–0.1)
Basophils Relative: 0.6 % (ref 0.0–3.0)
Eosinophils Absolute: 0.1 10*3/uL (ref 0.0–0.7)
Eosinophils Relative: 2.6 % (ref 0.0–5.0)
HCT: 39.4 % (ref 36.0–46.0)
Hemoglobin: 13.5 g/dL (ref 12.0–15.0)
Lymphocytes Relative: 44.7 % (ref 12.0–46.0)
Lymphs Abs: 2.4 10*3/uL (ref 0.7–4.0)
MCHC: 34.3 g/dL (ref 30.0–36.0)
MCV: 89.4 fl (ref 78.0–100.0)
Monocytes Absolute: 0.5 10*3/uL (ref 0.1–1.0)
Monocytes Relative: 8.8 % (ref 3.0–12.0)
Neutro Abs: 2.3 10*3/uL (ref 1.4–7.7)
Neutrophils Relative %: 43.3 % (ref 43.0–77.0)
Platelets: 171 10*3/uL (ref 150.0–400.0)
RBC: 4.4 Mil/uL (ref 3.87–5.11)
RDW: 12.9 % (ref 11.5–14.6)
WBC: 5.4 10*3/uL (ref 4.5–10.5)

## 2012-12-12 LAB — LIPID PANEL
Cholesterol: 187 mg/dL (ref 0–200)
HDL: 48.5 mg/dL (ref >39.00)
LDL Cholesterol: 123 mg/dL — ABNORMAL HIGH (ref 0–99)
Total CHOL/HDL Ratio: 4
Triglycerides: 80 mg/dL (ref 0.0–149.0)
VLDL: 16 mg/dL (ref 0.0–40.0)

## 2012-12-12 LAB — TSH: TSH: 1.42 u[IU]/mL (ref 0.35–5.50)

## 2012-12-12 MED ORDER — CYCLOBENZAPRINE HCL 5 MG PO TABS: 5.0000 mg | ORAL_TABLET | Freq: Three times a day (TID) | ORAL | Status: DC | PRN

## 2012-12-12 NOTE — Patient Instructions (Addendum)
Today we updated your med list in our EPIC system...    Continue your current medications the same...  Today we did your follow up FASTING blood work...    We will contact you w/ the results when available...   Good luck w/ the MRI & DrPoole's eval...  Call for any questions...  Let's plan a follow up visit in 75mo, sooner if needed for problems.Marland KitchenMarland Kitchen

## 2012-12-12 NOTE — Progress Notes (Signed)
Subjective:    Patient ID: Alison Cuevas, female    DOB: 14-Feb-1946, 67 y.o.   MRN: [id number]  HPI 67 y/o WF here for a follow up office visit... he has multiple medical problems as noted below...   ~  May 12, 2011:  18mo ROV & she saw TP last mo w/ decr energy, tired all the time, aching/ sore, difficulty resting, etc; felt to have a FM flair & given Flexeril Tid & it is helping some she says; still w/ chr LBP followed by DrPool on Tylenol/ Mobic... BP remains well regulated on her meds;  Chol has been reasonable on her diet management;  GI remains stable per DrPatterson & GU managed by DrFontaine & DrMacDiarmid (see below);  We reviewed her prev labs and radiologic data...  ~  December 15, 2011:  2mo ROV & Alison Cuevas has had 4-5 visits w/ GYN, DrFontaine for her organ prolapse w/ cystocele, enterocele, rectocele; they have decided on pessary rx & she was fitted- now waiting on the device to arrive for insertion;  She remains under stress w/ "Pete's" medical issues, back pain, etc...    Allergies> on OTC antihist, Benedryl, Flonase, Mucinex, etc...    HBP> on Lisin10, Verap180; BP= 122/68 and she denies CP, palpit, SOB, edema, etc...    Chol> on diet + FishOil; f/u FLP last week showed TChol 161, TG 93, HDL 48, LDL 95    GI- GERD. Divertics> on Miralax; she had a right hemicolectomy in 1993, last colon was 11/07 by DrPatterson w/ normal left colon...    GYN- pelvic organ prolapse> per DrFontaine as noted...    DJD, FM, neck&back pain> on Flex5Tid & Mobic15 as needed; she requests stronger prn pain med & we wrote for Vicodin Q8h prn...    Anxiety> she is under a lot of stress; uses Xanax 0.5mg  prn... We reviewed prob list, meds, xrays and labs> see below>> LABS 6/13:  FLP- at goals on diet;  Chems- wnl;  CBC- wnl;  TSH=1.32;  VitD=38 on 2000u daily...  ~  June 14, 2012:  18mo ROV & Alison Cuevas is under treatment for a UTI now from her GYN; notes excess sleepy & no energy; otherw stable, doing well &  requests Rx for Shingles vaccine...  We reviewed the following medical problems during today's office visit >>     Allergies> on OTC antihist, Benedryl, Flonase, Mucinex, etc...    HBP> on Lisin10, Verap180; BP= 124/82 and she denies CP, palpit, SOB, edema, etc...    Chol> on diet + FishOil; f/u FLP 6/13 showed TChol 161, TG 93, HDL 48, LDL 95    GI- GERD. Divertics> on Miralax; she had a right hemicolectomy in 1993, last colon was 11/07 by DrPatterson w/ normal left colon...    GYN- pelvic organ prolapse> per DrFontaine as noted he was treating her w/ a pessary...    DJD, FM, neck&back pain> on Flex5Tid & Mobic15 as needed; she requested stronger prn pain med & we wrote for Vicodin Q8h prn...    Anxiety> she is under a lot of stress; uses Xanax 0.5mg  prn... We reviewed prob list, meds, xrays and labs> see below for updates >> she declines the 2013 Flu vaccine; requests several refill presecriptions...  ~  December 12, 2012:  18mo ROV & Alison Cuevas's CC is LBP radiating into her hips- she has been eval by DrPoole & MRI is pending; currently taking Mobic15, VicodinTid prn, Flexeril5Tid prn;  Her other major issue is pelvic organ prolapse> followed  by DrFontaine> using pessary but it's hard to fit 7 she may need surgery (they favor Rx at [facility])... We reviewed the following medical problems during today's office visit >>     Allergies> on OTC antihist, Benedryl, Flonase, Mucinex, etc; reasonably well controlled...    HBP> on Lisin10, Verap180; BP= 120/70 and she denies CP, palpit, SOB, edema, etc...    Chol> on diet + FishOil & OTC Niacin; FLP 6/14 shows TChol 187, TG 80, HDL 49, LDL 123; we reviewed diet & exercise...    GI- GERD. Divertics> on Miralax; she had a right hemicolectomy in 1993, last colon was 11/07 by DrPatterson w/ normal left colon...    GYN- pelvic organ prolapse> per DrFontaine as noted he was treating her w/ a pessary=> she tells me she may need surg...    DJD, FM, neck&back pain> on  VicodinTid, Flex5Tid & Mobic15 as needed; she is being evaluated by DrPoole & MRI pending...    Anxiety> she is under a lot of stress; uses Xanax 0.5mg  prn... We reviewed prob list, meds, xrays and labs> see below for updates >>  LABS 6/14:  FLP- ok on FishOil & Niacin x LDL=123;  Chems- wnl;  CBC- wnl;  TSH=1.42            Problem List:   ALLERGY (ICD-995.3) - she has allergic rhinitis symptoms and increased difficulty w/ the warmer weather... she's been on LORATIDINE 10mg /d, plus saline nasal spray during the day, and FLONASE in each nostril Qhs... she uses Mucinex OTC as needed and had DrCNewman wash out her ears... ~  f/u eval DrCNewman 12/10 for "crazy feelin in my head"- rec continued Saline lavage, Flonase, etc...  HYPERTENSION (ICD-401.9) - controlled on VERAPAMIL SR 180mg /d + LISINOPRIL 10mg /d...  ~  CXR 3/10 showed normal heart size, clear lungs, s/p CSpine fusion... ~  1/11:  BP=128/70 and similar at home; denies HA, visual changes, CP, palipit, dizziness, syncope, dyspnea, edema, etc... both meds are on the [facility] list and she is pleased w/ the results... ~  5/12:  BP= 112/74 & she remains asymptomatic... ~  11/12:  BP= 126/70 & she will continue diet & same meds... ~  6/13:  BP= 122/68 and she denies CP, palpit, SOB, edema, etc...  ~  6/14: on Lisin10, Verap180; BP= 120/70 and she denies CP, palpit, SOB, edema, etc.  HYPERCHOLESTEROLEMIA (ICD-272.0) - on diet alone... ~  FLP 3/09 showed Tchol 202, Tg 56, HDL 44, LDL 137... rec- diet efforts & get wt down... ~  FLP 9/09 showed TChol 183, TG 78, HDL 37, LDL 131... ~  FLP 3/10 (wt=146#) showed TChol 169, TG 47, HDL 51, LDL 108... improved on diet Rx... ~  FLP 1/11 (wt=149#) showed TChol 172, TG 66, HDL 46, LDL 113 ~  FLP 5/12 (wt=156#) showed TChol 168, TG 70, HDL 48, LDL 106 ~  FLP 6/13 (wt=156#) showed TChol 161, TG 93, HDL 48, LDL 95 ~  FLP 6/14 (wt=155#) showed TChol 187, TG 80, HDL 49, LDL 123   GERD (ICD-530.81) - mild  reflux symptoms.. she uses OTC Prilosec as needed.  Hx of DIVERTICULITIS OF COLON (ICD-562.11) & IBS - she uses MIRALAX daily...s/p right hemicolectomy 10/93 by Lurline Hare... last colonoscopy was 11/07 by DrPatterson showing normal left colon... she's had adhesions and recurrent abd pain due to IBS- eval by GI/ DrPatterson...  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 ~  7/10: eval by DrMacDiarmid for Urology- he rec watchful waiting at this time... ~  12/10: she had f/u DrFontaine for GYN- he plans BMD as well... ~  We don't have any of this data> BMD per DrFontaine/ GYN; GU per DrMacDiarmid & he rec holding off on surg if it isn't bothering her... ~  BMD 1/11 per GYN was wnl w/ TScore -0.8 in left FemNeck... ~  6/13:  Review in EPIC shows 4-5 visits w/ DrFontaine recently & plans for Pessary trial; VitD=38 on OTC supplements. ~  12/13: note from DrFontaine- enterocele/ rectocele/ cystocele- pessary replaced... ~  6/14: she remains under the care of DrFontaine but she tells me she may need surg...  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 she has been followed by DrPool for neurosurgery- she had a 3 level anterior cervical diskectomy and fusion w/ plating 10/03... 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 She says DrPool has indicated that she needs surg w/ lumbar rods etc but she is holding off... ~  1/11:  BMD from DrFontaine showed TScores 0.0 in Spine & -0.8 in left FemNeck... ~  6/13:  She is c/o increased LBP despite Flexeril/ Mobic; we wrote for VICODIN Q8h as needed for severe pain... ~  6/14: on VicodinTid, Flex5Tid & Mobic15 as needed; she is being evaluated by DrPoole & MRI pending  ANXIETY (ICD-300.00) - on ALPRAZOLAM 0.5mg  as needed...  she was anxious about poss AAA (+ in one of her parents)- we did an AbdSonar 3/09 that was neg.  Health Maintenance - GYN= DrFontaine for PAP, BMD (wnl 1/11), and Mammograms at [facility]... she takes Calcium, women's MVI, Vit D... She had PNEUMOVAX 11/11 at age 63.   Past Surgical History  Procedure Laterality Date  . Right hemicolectomy for diverticulitis with abscess  1993  . Abdominal hysterectomy      TAH  . Resection of colon      BENIGN TUMOR  . Tubal ligation    . Cholecystectomy, laparoscopic    . Cspine surgery      Dr. Estelle Grumbles level ant cerv diskectomy /fusion w/plating    Outpatient Encounter Prescriptions as of 12/12/2012  Medication Sig Dispense Refill  . ALPRAZolam (XANAX) 0.5 MG tablet TAKE 1/2 TO 1 TABLET 3 TIMES A DAY AS NEEDED FOR NERVES  90 tablet  5  . aspirin 81 MG tablet Take 81 mg by mouth daily.      . B Complex-C (SUPER B COMPLEX PO) Take by mouth daily.        . Calcium Carbonate-Vitamin D (CALTRATE 600+D) 600-400 MG-UNIT per tablet Take 1 tablet by mouth daily.        . Cholecalciferol (VITAMIN D) 2000 UNITS CAPS Take 1 capsule by mouth daily.        . clobetasol cream (TEMOVATE) 0.05 % Apply topically 2 (two) times daily.  30 g  1  . Cranberry 1000 MG CAPS Take 2 capsules by mouth daily.       . cyclobenzaprine (FLEXERIL) 5 MG tablet Take 1 tablet (5 mg total) by mouth 3 (three) times daily as needed for muscle spasms.  60 tablet  5  . diphenhydrAMINE (BENADRYL) 25 MG tablet Take 25 mg by mouth at bedtime as needed.      . fish oil-omega-3 fatty acids 1000 MG capsule Take 2 g by  mouth daily.        . fluticasone (FLONASE) 50 MCG/ACT nasal spray PLACE 2 SPRAYS INTO THE NOSE AT BEDTIME AS NEEDED.  16 g  3  . guaiFENesin (MUCINEX) 600 MG 12 hr tablet Take 1,200 mg by mouth 2 (two) times daily.        Marland Kitchen HYDROcodone-acetaminophen (NORCO/VICODIN) 5-325 MG per tablet Take 1/2 to 1 tablet by mouth three times daily as needed for pain.  NOT TO EXCEED THREE PER  DAY.  90 tablet  5  . lisinopril (PRINIVIL,ZESTRIL) 10 MG tablet Take 1 tablet (10 mg total) by mouth daily.  90 tablet  3  . meloxicam (MOBIC) 15 MG tablet Take 1 tablet (15 mg total) by mouth daily as needed for pain.  90 tablet  3  . Multiple Vitamins-Minerals (WOMENS MULTIVITAMIN PLUS PO) Take 1 tablet by mouth daily.        . niacin 250 MG tablet Take 250 mg by mouth daily with breakfast.      . nystatin-triamcinolone (MYCOLOG II) cream Apply topically 4 (four) times daily.  30 g  2  . polyethylene glycol powder (GLYCOLAX/MIRALAX) powder Take 17 g by mouth daily as needed.        . verapamil (CALAN-SR) 180 MG CR tablet Take 1 tablet (180 mg total) by mouth at bedtime.  90 tablet  3  . vitamin B-12 (CYANOCOBALAMIN) 1000 MCG tablet Take 1,000 mcg by mouth daily.        Marland Kitchen VITAMIN E PO Take by mouth.      . [DISCONTINUED] amoxicillin-clavulanate (AUGMENTIN) 875-125 MG per tablet Take 1 tablet by mouth 2 (two) times daily.  14 tablet  0  . [DISCONTINUED] azithromycin (ZITHROMAX) 250 MG tablet Take as directed  6 tablet  0  . [DISCONTINUED] ciprofloxacin (CIPRO) 250 MG tablet Take 1 tablet (250 mg total) by mouth 2 (two) times daily. For 3 days  6 tablet  0  . [DISCONTINUED] lisinopril (PRINIVIL,ZESTRIL) 10 MG tablet TAKE 1 TABLET BY MOUTH ONCE A DAY  90 tablet  1  . [DISCONTINUED] verapamil (CALAN-SR) 180 MG CR tablet TAKE 1 TABLET EVERY DAY FOR BP  90 tablet  1  . [DISCONTINUED] verapamil (CALAN-SR) 180 MG CR tablet TAKE 1 TABLET EVERY DAY FOR BP  90 tablet  3   No facility-administered encounter medications on file as of 12/12/2012.    Allergies  Allergen Reactions  . Prednisone     REACTION: funny feeling    Current Medications, Allergies, Past Medical History, Past Surgical History, Family History, and Social History were reviewed in [employer] record.   Review of Systems        See HPI - all other systems neg except as noted... The patient complains of  headaches.  The patient denies anorexia, fever, weight loss, weight gain, vision loss, decreased hearing, hoarseness, chest pain, syncope, dyspnea on exertion, peripheral edema, prolonged cough, hemoptysis, abdominal pain, melena, hematochezia, severe indigestion/heartburn, hematuria, incontinence, muscle weakness, suspicious skin lesions, transient blindness, difficulty walking, depression, unusual weight change, abnormal bleeding, enlarged lymph nodes, and angioedema.     Objective:   Physical Exam     WD, WN, 67 y/o WF in NAD... GENERAL:  Alert & oriented; pleasant & cooperative... HEENT:  [place]/AT, EOM-wnl, PERRLA, EACs- min wax, NOSE- neg, THROAT- clear & wnl. NECK:  Supple w/ decrROM & ant cx scar; no JVD; normal carotid impulses w/o bruits; no thyromegaly or nodules palpated; no lymphadenopathy. CHEST:  Clear  to P & A; without wheezes/ rales/ or rhonchi heard... HEART:  Regular Rhythm; without murmurs/ rubs/ or gallops detected... ABDOMEN:  Soft & nontender; normal bowel sounds; no organomegaly or masses detected. EXT: without deformities, mild arthritic changes; no varicose veins/ +venous insuffic/ no edema. NEURO:  CN's intact; no focal neuro deficits; normal DTR's... DERM:  No lesions noted; no rash etc...  RADIOLOGY DATA:  Reviewed in the EPIC EMR & discussed w/ the patient...  LABORATORY DATA:  Reviewed in the EPIC EMR & discussed w/ the patient...   Assessment & Plan:    HBP>  Controlled on meds, continue same...  HYPERLIPID>  Stable on diet alone, rec to incr exercise as well...  GI>  GERD, Divertics, IBS>  Stable on Omep + Miralax, etc...  GU>  Pelvic organ prolapse>  eval & management by DrFontaine for GYN (pessary trial ongoing) & DrMacDiarmid for Urology...  DJD/ Neck & Back Pain>  Managed by DrPool for NS & she is holding off on surg; MRI is pending; we filled VICODIN per request...  ANXIETY>  Stable on Alpraz as needed...   Patient's Medications  New  Prescriptions   No medications on file  Previous Medications   ALPRAZOLAM (XANAX) 0.5 MG TABLET    TAKE 1/2 TO 1 TABLET 3 TIMES A DAY AS NEEDED FOR NERVES   ASPIRIN 81 MG TABLET    Take 81 mg by mouth daily.   B COMPLEX-C (SUPER B COMPLEX PO)    Take by mouth daily.     CALCIUM CARBONATE-VITAMIN D (CALTRATE 600+D) 600-400 MG-UNIT PER TABLET    Take 1 tablet by mouth daily.     CHOLECALCIFEROL (VITAMIN D) 2000 UNITS CAPS    Take 1 capsule by mouth daily.     CLOBETASOL CREAM (TEMOVATE) 0.05 %    Apply topically 2 (two) times daily.   CRANBERRY 1000 MG CAPS    Take 2 capsules by mouth daily.    DIPHENHYDRAMINE (BENADRYL) 25 MG TABLET    Take 25 mg by mouth at bedtime as needed.   FISH OIL-OMEGA-3 FATTY ACIDS 1000 MG CAPSULE    Take 2 g by mouth daily.     FLUTICASONE (FLONASE) 50 MCG/ACT NASAL SPRAY    PLACE 2 SPRAYS INTO THE NOSE AT BEDTIME AS NEEDED.   GUAIFENESIN (MUCINEX) 600 MG 12 HR TABLET    Take 1,200 mg by mouth 2 (two) times daily.     HYDROCODONE-ACETAMINOPHEN (NORCO/VICODIN) 5-325 MG PER TABLET    Take 1/2 to 1 tablet by mouth three times daily as needed for pain.  NOT TO EXCEED THREE PER DAY.   LISINOPRIL (PRINIVIL,ZESTRIL) 10 MG TABLET    Take 1 tablet (10 mg total) by mouth daily.   MELOXICAM (MOBIC) 15 MG TABLET    Take 1 tablet (15 mg total) by mouth daily as needed for pain.   MULTIPLE VITAMINS-MINERALS (WOMENS MULTIVITAMIN PLUS PO)    Take 1 tablet by mouth daily.     NIACIN 250 MG TABLET    Take 250 mg by mouth daily with breakfast.   NYSTATIN-TRIAMCINOLONE (MYCOLOG II) CREAM    Apply topically 4 (four) times daily.   POLYETHYLENE GLYCOL POWDER (GLYCOLAX/MIRALAX) POWDER    Take 17 g by mouth daily as needed.     VERAPAMIL (CALAN-SR) 180 MG CR TABLET    Take 1 tablet (180 mg total) by mouth at bedtime.   VITAMIN B-12 (CYANOCOBALAMIN) 1000 MCG TABLET    Take 1,000 mcg by mouth daily.  VITAMIN E PO    Take by mouth.  Modified Medications   Modified Medication Previous  Medication   CYCLOBENZAPRINE (FLEXERIL) 5 MG TABLET cyclobenzaprine (FLEXERIL) 5 MG tablet      Take 1 tablet (5 mg total) by mouth 3 (three) times daily as needed for muscle spasms.    Take 1 tablet (5 mg total) by mouth 3 (three) times daily as needed for muscle spasms.  Discontinued Medications   AMOXICILLIN-CLAVULANATE (AUGMENTIN) 875-125 MG PER TABLET    Take 1 tablet by mouth 2 (two) times daily.   AZITHROMYCIN (ZITHROMAX) 250 MG TABLET    Take as directed   CIPROFLOXACIN (CIPRO) 250 MG TABLET    Take 1 tablet (250 mg total) by mouth 2 (two) times daily. For 3 days   LISINOPRIL (PRINIVIL,ZESTRIL) 10 MG TABLET    TAKE 1 TABLET BY MOUTH ONCE A DAY   VERAPAMIL (CALAN-SR) 180 MG CR TABLET    TAKE 1 TABLET EVERY DAY FOR BP   VERAPAMIL (CALAN-SR) 180 MG CR TABLET    TAKE 1 TABLET EVERY DAY FOR BP

## 2012-12-13 ENCOUNTER — Ambulatory Visit (INDEPENDENT_AMBULATORY_CARE_PROVIDER_SITE_OTHER): Payer: Medicare Other | Admitting: Gynecology

## 2012-12-13 ENCOUNTER — Encounter: Payer: Self-pay | Admitting: Gynecology

## 2012-12-13 DIAGNOSIS — N949 Unspecified condition associated with female genital organs and menstrual cycle: Secondary | ICD-10-CM | POA: Diagnosis not present

## 2012-12-13 DIAGNOSIS — N952 Postmenopausal atrophic vaginitis: Secondary | ICD-10-CM | POA: Diagnosis not present

## 2012-12-13 DIAGNOSIS — R82998 Other abnormal findings in urine: Secondary | ICD-10-CM | POA: Diagnosis not present

## 2012-12-13 DIAGNOSIS — N9489 Other specified conditions associated with female genital organs and menstrual cycle: Secondary | ICD-10-CM

## 2012-12-13 LAB — URINALYSIS W MICROSCOPIC + REFLEX CULTURE
Bilirubin Urine: NEGATIVE
Casts: NONE SEEN
Crystals: NONE SEEN
Glucose, UA: NEGATIVE mg/dL
Hgb urine dipstick: NEGATIVE
Ketones, ur: NEGATIVE mg/dL
Nitrite: NEGATIVE
Protein, ur: NEGATIVE mg/dL
Specific Gravity, Urine: 1.015 (ref 1.005–1.030)
Urobilinogen, UA: 0.2 mg/dL (ref 0.0–1.0)
pH: 6.5 (ref 5.0–8.0)

## 2012-12-13 LAB — WET PREP FOR TRICH, YEAST, CLUE
Clue Cells Wet Prep HPF POC: NONE SEEN
Trich, Wet Prep: NONE SEEN
Yeast Wet Prep HPF POC: NONE SEEN

## 2012-12-13 MED ORDER — CLINDAMYCIN PHOSPHATE 2 % VA CREA: 1.0000 | TOPICAL_CREAM | Freq: Every day | VAGINAL | Status: DC

## 2012-12-13 NOTE — Patient Instructions (Signed)
Use vaginal cream nightly for one week. Office will call you if urinalysis shows any evidence of infection

## 2012-12-13 NOTE — Progress Notes (Signed)
Patient presents complaining of vulvar burning. She does have a chronic issue with this and has been using Temovate cream intermittently with good results but most recently seems to have a worse recurrence.  Exam with Selena Batten assistant External BUS vagina with atrophic changes. Large enterocele noted. Bimanual without masses or tenderness. Rectovaginal exam shows some weakness in her sphincter.  Assessment and plan: 1. Vulvar burning. Her wet prep suggestive of a low level bacterial vaginosis. We'll cover her with Cleocin vaginal cream nightly x7 days. Continue with Temovate when necessary. Followup if symptoms persist or recur. 2. Enterocele. Previously had spoken to Dr. Terrilee Files. We tried a pessary but it had turned in caused her discomfort. Options to try a different pessary versus or followup with Dr. Terrilee Files discussed and she is leaning towards talking to him about doing surgery. She does not want to go with a pessary at this time. 3. Complaints of occasional fecal incontinence. Sphincter symmetrical but somewhat weakened. Differential to include neurologic versus muscular discussed. Option is to refer to a colon surgeon skilled in sphincter repair offered but declined. Patient prefers just to monitor at present.

## 2012-12-15 LAB — URINE CULTURE

## 2012-12-18 ENCOUNTER — Other Ambulatory Visit: Payer: Self-pay | Admitting: *Deleted

## 2012-12-18 MED ORDER — SULFAMETHOXAZOLE-TRIMETHOPRIM 800-160 MG PO TABS: 1.0000 | ORAL_TABLET | Freq: Two times a day (BID) | ORAL | Status: DC

## 2012-12-20 ENCOUNTER — Ambulatory Visit
Admission: RE | Admit: 2012-12-20 | Discharge: 2012-12-20 | Disposition: A | Discharge status: Home / Self Care | Payer: Medicare Other | Source: Ambulatory Visit | Attending: Neurosurgery | Admitting: Neurosurgery

## 2012-12-20 DIAGNOSIS — M48061 Spinal stenosis, lumbar region without neurogenic claudication: Secondary | ICD-10-CM | POA: Diagnosis not present

## 2012-12-20 DIAGNOSIS — M545 Low back pain, unspecified: Secondary | ICD-10-CM

## 2012-12-20 DIAGNOSIS — M47817 Spondylosis without myelopathy or radiculopathy, lumbosacral region: Secondary | ICD-10-CM | POA: Diagnosis not present

## 2012-12-20 DIAGNOSIS — M4120 Other idiopathic scoliosis, site unspecified: Secondary | ICD-10-CM | POA: Diagnosis not present

## 2012-12-27 DIAGNOSIS — M48062 Spinal stenosis, lumbar region with neurogenic claudication: Secondary | ICD-10-CM | POA: Diagnosis not present

## 2013-02-05 DIAGNOSIS — M48062 Spinal stenosis, lumbar region with neurogenic claudication: Secondary | ICD-10-CM | POA: Diagnosis not present

## 2013-02-27 ENCOUNTER — Other Ambulatory Visit: Payer: Self-pay | Admitting: Pulmonary Disease

## 2013-02-27 MED ORDER — VERAPAMIL HCL ER 180 MG PO TBCR: 180.0000 mg | EXTENDED_RELEASE_TABLET | Freq: Every day | ORAL | Status: DC

## 2013-03-05 ENCOUNTER — Telehealth: Payer: Self-pay | Admitting: Pulmonary Disease

## 2013-03-05 MED ORDER — AZITHROMYCIN 250 MG PO TABS: ORAL_TABLET | ORAL | Status: DC

## 2013-03-05 NOTE — Telephone Encounter (Signed)
Per SN---  Ok to call in zpak #1  Take as directed with no refills. Called and spoke with pt and she is aware of rx sent to the pharmacy and nothing further is needed. Pt did state that she did have the injection in her back. Wanted to let SN know.

## 2013-03-05 NOTE — Telephone Encounter (Signed)
Spoke with the pt She is c/o nasal congestion, PND and prod cough with minimal green sputum for 3 days She has been taking mucinex dm bid with little relief Denies any f/c/s, SOB, CP or other co's Last ov with SN 6/181/14 Next ov 06/12/13 Allergies  Allergen Reactions  . Prednisone     REACTION: funny feeling   Please advise thanks!

## 2013-03-08 ENCOUNTER — Other Ambulatory Visit: Payer: Self-pay | Admitting: Pulmonary Disease

## 2013-03-20 ENCOUNTER — Ambulatory Visit (INDEPENDENT_AMBULATORY_CARE_PROVIDER_SITE_OTHER): Payer: Medicare Other | Admitting: Gynecology

## 2013-03-20 ENCOUNTER — Encounter: Payer: Self-pay | Admitting: Gynecology

## 2013-03-20 DIAGNOSIS — K469 Unspecified abdominal hernia without obstruction or gangrene: Secondary | ICD-10-CM

## 2013-03-20 DIAGNOSIS — R300 Dysuria: Secondary | ICD-10-CM

## 2013-03-20 DIAGNOSIS — N9489 Other specified conditions associated with female genital organs and menstrual cycle: Secondary | ICD-10-CM

## 2013-03-20 DIAGNOSIS — N8111 Cystocele, midline: Secondary | ICD-10-CM

## 2013-03-20 DIAGNOSIS — N949 Unspecified condition associated with female genital organs and menstrual cycle: Secondary | ICD-10-CM

## 2013-03-20 DIAGNOSIS — N952 Postmenopausal atrophic vaginitis: Secondary | ICD-10-CM | POA: Diagnosis not present

## 2013-03-20 DIAGNOSIS — N816 Rectocele: Secondary | ICD-10-CM

## 2013-03-20 LAB — URINALYSIS W MICROSCOPIC + REFLEX CULTURE
Bilirubin Urine: NEGATIVE
Casts: NONE SEEN
Crystals: NONE SEEN
Glucose, UA: NEGATIVE mg/dL
Hgb urine dipstick: NEGATIVE
Ketones, ur: NEGATIVE mg/dL
Nitrite: NEGATIVE
Protein, ur: NEGATIVE mg/dL
Specific Gravity, Urine: 1.015 (ref 1.005–1.030)
Urobilinogen, UA: 0.2 mg/dL (ref 0.0–1.0)
pH: 6 (ref 5.0–8.0)

## 2013-03-20 LAB — WET PREP FOR TRICH, YEAST, CLUE
Clue Cells Wet Prep HPF POC: NONE SEEN
Trich, Wet Prep: NONE SEEN
Yeast Wet Prep HPF POC: NONE SEEN

## 2013-03-20 MED ORDER — CLINDAMYCIN PHOSPHATE 2 % VA CREA: 1.0000 | TOPICAL_CREAM | Freq: Every day | VAGINAL | Status: DC

## 2013-03-20 NOTE — Patient Instructions (Signed)
Use vaginal cream medication nightly for one week. Office will call you if the urine grows out any bacteria. Followup with your pessary for recheck at your convenience.

## 2013-03-20 NOTE — Progress Notes (Signed)
Patient presents with several day history of vaginal burning and some mild dysuria. No frequency urgency fever chills lower abdominal pain or low back pain. Similar symptoms when I saw her in June and she was treated with Cleocin vaginal cream and the symptoms resolved.  Exam was Alison Cuevas Abdomen soft nontender without masses guarding rebound organomegaly. Pelvic external BUS vagina with atrophic changes. Enterocele/cystocele/rectocele noted. Scant discharge. Bimanual without masses or tenderness.  Urinalysis with 3-6 WBC 0-2 RBC rare bacteria Wet prep with TNTC WBC many bacteria no clue cells amine or yeast  Assessment and plan: 1. Pelvic prolapse. Patient had trial of Gellhorn pessary but didn't like it.  I asked her to bring it with her when she comes in for her annual exam we'll recheck it and maybe refit her with a different type. 2. Vaginal burning. Suspect low-grade bacterial vaginosis. Will treat with Cleocin vaginal cream x7 days followup if symptoms persist, worsen or recur. 3. Urinalysis with several WBC and bacteria but otherwise unremarkable. Will wait culture and treat accordingly.

## 2013-03-22 LAB — URINE CULTURE
Colony Count: NO GROWTH
Organism ID, Bacteria: NO GROWTH

## 2013-03-26 ENCOUNTER — Telehealth: Payer: Self-pay | Admitting: *Deleted

## 2013-03-26 MED ORDER — CLOBETASOL PROPIONATE 0.05 % EX CREA: TOPICAL_CREAM | Freq: Two times a day (BID) | CUTANEOUS | Status: DC

## 2013-03-26 NOTE — Telephone Encounter (Signed)
Recommend trial of Temovate 0.05% cream nightly

## 2013-03-26 NOTE — Telephone Encounter (Signed)
Her described allergy is a "funny feeling". I think the use of a topical cream would be okay.

## 2013-03-26 NOTE — Telephone Encounter (Signed)
Rx sent, pt informed. 

## 2013-03-26 NOTE — Telephone Encounter (Signed)
Pt called to follow from OV 03/20/13 given cleocin vaginal cream x 7 days, pt said she has 1 day left and vaginal burning still there. At times cream would work, but burning returned. She has tired using cream on outside of vagina as well, but no relief. Please advise

## 2013-03-26 NOTE — Telephone Encounter (Signed)
Dr. Audie Box when I tired to e-scribe this Rx it came up with /allergy contraindication with prednisone.

## 2013-03-27 ENCOUNTER — Other Ambulatory Visit: Payer: Self-pay | Admitting: Pulmonary Disease

## 2013-03-30 ENCOUNTER — Other Ambulatory Visit: Payer: Self-pay | Admitting: Pulmonary Disease

## 2013-04-04 DIAGNOSIS — H02839 Dermatochalasis of unspecified eye, unspecified eyelid: Secondary | ICD-10-CM | POA: Diagnosis not present

## 2013-04-17 ENCOUNTER — Other Ambulatory Visit: Payer: Self-pay | Admitting: Gynecology

## 2013-04-17 DIAGNOSIS — Z1231 Encounter for screening mammogram for malignant neoplasm of breast: Secondary | ICD-10-CM

## 2013-05-02 ENCOUNTER — Encounter (HOSPITAL_COMMUNITY): Payer: Self-pay | Admitting: Emergency Medicine

## 2013-05-02 ENCOUNTER — Emergency Department (HOSPITAL_COMMUNITY)
Admission: EM | Admit: 2013-05-02 | Discharge: 2013-05-03 | Disposition: A | Discharge status: Home / Self Care | Payer: Medicare Other | Attending: Emergency Medicine | Admitting: Emergency Medicine

## 2013-05-02 DIAGNOSIS — R112 Nausea with vomiting, unspecified: Secondary | ICD-10-CM | POA: Diagnosis not present

## 2013-05-02 DIAGNOSIS — Z7982 Long term (current) use of aspirin: Secondary | ICD-10-CM | POA: Diagnosis not present

## 2013-05-02 DIAGNOSIS — F411 Generalized anxiety disorder: Secondary | ICD-10-CM | POA: Insufficient documentation

## 2013-05-02 DIAGNOSIS — R109 Unspecified abdominal pain: Secondary | ICD-10-CM | POA: Diagnosis not present

## 2013-05-02 DIAGNOSIS — Z8739 Personal history of other diseases of the musculoskeletal system and connective tissue: Secondary | ICD-10-CM | POA: Diagnosis not present

## 2013-05-02 DIAGNOSIS — R42 Dizziness and giddiness: Secondary | ICD-10-CM | POA: Diagnosis not present

## 2013-05-02 DIAGNOSIS — Z862 Personal history of diseases of the blood and blood-forming organs and certain disorders involving the immune mechanism: Secondary | ICD-10-CM | POA: Insufficient documentation

## 2013-05-02 DIAGNOSIS — R509 Fever, unspecified: Secondary | ICD-10-CM | POA: Diagnosis not present

## 2013-05-02 DIAGNOSIS — Z8719 Personal history of other diseases of the digestive system: Secondary | ICD-10-CM | POA: Insufficient documentation

## 2013-05-02 DIAGNOSIS — K5900 Constipation, unspecified: Secondary | ICD-10-CM | POA: Insufficient documentation

## 2013-05-02 DIAGNOSIS — Z79899 Other long term (current) drug therapy: Secondary | ICD-10-CM | POA: Insufficient documentation

## 2013-05-02 DIAGNOSIS — I10 Essential (primary) hypertension: Secondary | ICD-10-CM | POA: Diagnosis not present

## 2013-05-02 DIAGNOSIS — Z8639 Personal history of other endocrine, nutritional and metabolic disease: Secondary | ICD-10-CM | POA: Insufficient documentation

## 2013-05-02 DIAGNOSIS — R1110 Vomiting, unspecified: Secondary | ICD-10-CM | POA: Diagnosis not present

## 2013-05-02 LAB — COMPREHENSIVE METABOLIC PANEL
ALT: 26 U/L (ref 0–35)
AST: 29 U/L (ref 0–37)
Albumin: 3.6 g/dL (ref 3.5–5.2)
Alkaline Phosphatase: 64 U/L (ref 39–117)
BUN: 16 mg/dL (ref 6–23)
CO2: 24 mEq/L (ref 19–32)
Calcium: 9.1 mg/dL (ref 8.4–10.5)
Chloride: 94 mEq/L — ABNORMAL LOW (ref 96–112)
Creatinine, Ser: 0.8 mg/dL (ref 0.50–1.10)
GFR calc Af Amer: 86 mL/min — ABNORMAL LOW (ref >90)
GFR calc non Af Amer: 74 mL/min — ABNORMAL LOW (ref >90)
Glucose, Bld: 164 mg/dL — ABNORMAL HIGH (ref 70–99)
Potassium: 3.8 mEq/L (ref 3.5–5.1)
Sodium: 129 mEq/L — ABNORMAL LOW (ref 135–145)
Total Bilirubin: 1.1 mg/dL (ref 0.3–1.2)
Total Protein: 6.7 g/dL (ref 6.0–8.3)

## 2013-05-02 LAB — CBC WITH DIFFERENTIAL/PLATELET
Basophils Absolute: 0 10*3/uL (ref 0.0–0.1)
Basophils Relative: 0 % (ref 0–1)
Eosinophils Absolute: 0 10*3/uL (ref 0.0–0.7)
Eosinophils Relative: 0 % (ref 0–5)
HCT: 36.8 % (ref 36.0–46.0)
Hemoglobin: 13.2 g/dL (ref 12.0–15.0)
Lymphocytes Relative: 8 % — ABNORMAL LOW (ref 12–46)
Lymphs Abs: 0.9 10*3/uL (ref 0.7–4.0)
MCH: 31.1 pg (ref 26.0–34.0)
MCHC: 35.9 g/dL (ref 30.0–36.0)
MCV: 86.8 fL (ref 78.0–100.0)
Monocytes Absolute: 0.7 10*3/uL (ref 0.1–1.0)
Monocytes Relative: 6 % (ref 3–12)
Neutro Abs: 10.2 10*3/uL — ABNORMAL HIGH (ref 1.7–7.7)
Neutrophils Relative %: 86 % — ABNORMAL HIGH (ref 43–77)
Platelets: 175 10*3/uL (ref 150–400)
RBC: 4.24 MIL/uL (ref 3.87–5.11)
RDW: 12.9 % (ref 11.5–15.5)
WBC: 11.9 10*3/uL — ABNORMAL HIGH (ref 4.0–10.5)

## 2013-05-02 LAB — LIPASE, BLOOD: Lipase: 17 U/L (ref 11–59)

## 2013-05-02 MED ORDER — ONDANSETRON HCL 4 MG/2ML IJ SOLN
4.0000 mg | Freq: Once | INTRAMUSCULAR | Status: AC
  Administered 2013-05-02: 4 mg via INTRAVENOUS
  Filled 2013-05-02: qty 2

## 2013-05-02 MED ORDER — SODIUM CHLORIDE 0.9 % IV SOLN
1000.0000 mL | Freq: Once | INTRAVENOUS | Status: AC
  Administered 2013-05-02: 1000 mL via INTRAVENOUS

## 2013-05-02 MED ORDER — SODIUM CHLORIDE 0.9 % IV SOLN
1000.0000 mL | INTRAVENOUS | Status: DC
  Administered 2013-05-03: 1000 mL via INTRAVENOUS

## 2013-05-02 NOTE — ED Provider Notes (Signed)
CSN: [id number]     Arrival date & time 05/02/13  2257 History   This chart was scribed for Dione Booze, MD, by Yevette Edwards, ED Scribe. This patient was seen in room APA18/AP[facility]18 and the patient's care was started at 11:11 PM.  First MD Initiated Contact with Patient 05/02/13 2307     No chief complaint on file.   The history is provided by the patient. No language interpreter was used.   HPI Comments: Alison Cuevas is a 67 y.o. Female, with a h/o diverticulitis of the colon,  who presents to the Emergency Department complaining of emesis which began eleven hours ago and is associated with intermittent right-sided abdominal pain which she rates as 6/10. She reports that coughing increases the abdominal pain. The pt took Murelax prior to the emesis, and she subsequently experienced a BM which improved the abdominal pain. She took one hydrocodone to mitigate the pain, with some resolution. The pt has also experienced persistent nausea, a fever, measured at 101 F, chills, dizziness, and lightheadedness. Her temperature in the ED is 99.1 F. She denies any sick contacts. She states that she recently had an influenza vaccination, and she reports similar symptoms following last year's influenza vaccination. he pt is a non-smoker.   Past Medical History  Diagnosis Date  . Allergy history unknown   . Hypercholesteremia   . GERD (gastroesophageal reflux disease)   . Diverticulosis of colon   . DJD (degenerative joint disease)   . Anxiety   . Hypertension   . Osteopenia 05/2007    DEXA 2011 WNL  . Enterocele    Past Surgical History  Procedure Laterality Date  . Right hemicolectomy for diverticulitis with abscess  1993  . Abdominal hysterectomy      TAH  . Resection of colon      BENIGN TUMOR  . Tubal ligation    . Cholecystectomy, laparoscopic    . Cspine surgery      Dr. Estelle Grumbles level ant cerv diskectomy /fusion w/plating  . Cholecystectomy     Family History  Problem Relation Age of  Onset  . Hypertension Mother   . Breast cancer Sister   . Cancer Brother     melanoma  . Cancer Brother     prostate  . Lupus Sister   . Other Brother     lung disease   History  Substance Use Topics  . Smoking status: Never Smoker   . Smokeless tobacco: Never Used  . Alcohol Use: No   OB History   Grav Para Term Preterm Abortions TAB SAB Ect Mult Living   4 3   1     3      Review of Systems  Constitutional: Positive for fever and chills.  Gastrointestinal: Positive for nausea, vomiting, abdominal pain and constipation.  Neurological: Positive for dizziness and light-headedness.  All other systems reviewed and are negative.    Allergies  Prednisone  Home Medications   Current Outpatient Rx  Name  Route  Sig  Dispense  Refill  . ALPRAZolam (XANAX) 0.5 MG tablet      TAKE 1/2 TO 1 TABLET 3 TIMES A DAY AS NEEDED FOR NERVES   90 tablet   5   . aspirin 81 MG tablet   Oral   Take 81 mg by mouth daily.         . B Complex-C (SUPER B COMPLEX PO)   Oral   Take by mouth daily.           Marland Kitchen  Calcium Carbonate-Vitamin D (CALTRATE 600+D) 600-400 MG-UNIT per tablet   Oral   Take 1 tablet by mouth daily.           . Cholecalciferol (VITAMIN D) 2000 UNITS CAPS   Oral   Take 1 capsule by mouth daily.           . clindamycin (CLEOCIN) 2 % vaginal cream   Vaginal   Place 1 Applicatorful vaginally at bedtime.   40 g   0   . clobetasol cream (TEMOVATE) 0.05 %   Topical   Apply topically 2 (two) times daily.   30 g   0   . Cranberry 1000 MG CAPS   Oral   Take 2 capsules by mouth daily.          . diphenhydrAMINE (BENADRYL) 25 MG tablet   Oral   Take 25 mg by mouth at bedtime as needed.         . fish oil-omega-3 fatty acids 1000 MG capsule   Oral   Take 2 g by mouth daily.           . fluticasone (FLONASE) 50 MCG/ACT nasal spray      PLACE 2 SPRAYS INTO THE NOSE AT BEDTIME AS NEEDED.   16 g   3   . guaiFENesin (MUCINEX) 600 MG 12 hr  tablet   Oral   Take 1,200 mg by mouth 2 (two) times daily.           Marland Kitchen HYDROcodone-acetaminophen (NORCO/VICODIN) 5-325 MG per tablet      Take 1/2 to 1 tablet by mouth three times daily as needed for pain.  NOT TO EXCEED THREE PER DAY.   90 tablet   5   . lisinopril (PRINIVIL,ZESTRIL) 10 MG tablet   Oral   Take 1 tablet (10 mg total) by mouth daily.   90 tablet   3   . lisinopril (PRINIVIL,ZESTRIL) 10 MG tablet      TAKE 1 TABLET BY MOUTH ONCE A DAY   90 tablet   1   . meloxicam (MOBIC) 15 MG tablet   Oral   Take 1 tablet (15 mg total) by mouth daily as needed for pain.   90 tablet   3   . Multiple Vitamins-Minerals (WOMENS MULTIVITAMIN PLUS PO)   Oral   Take 1 tablet by mouth daily.           . niacin 250 MG tablet   Oral   Take 250 mg by mouth daily with breakfast.         . nystatin-triamcinolone (MYCOLOG II) cream   Topical   Apply topically 4 (four) times daily.   30 g   2   . polyethylene glycol powder (GLYCOLAX/MIRALAX) powder   Oral   Take 17 g by mouth daily as needed.           . verapamil (CALAN-SR) 180 MG CR tablet   Oral   Take 1 tablet (180 mg total) by mouth at bedtime.   90 tablet   3   . vitamin B-12 (CYANOCOBALAMIN) 1000 MCG tablet   Oral   Take 1,000 mcg by mouth daily.           Marland Kitchen VITAMIN E PO   Oral   Take by mouth.          Triage Vitals: BP 138/64  Pulse 98  Temp(Src) 99.1 F (37.3 C) (Oral)  Resp 20  Ht 5\' 1"  (1.549 m)  Wt 150 lb (68.04 kg)  BMI 28.36 kg/m2  SpO2 96%  LMP 06/27/1972  Physical Exam  Nursing note and vitals reviewed. Constitutional: She is oriented to person, place, and time. She appears well-developed and well-nourished. No distress.  HENT:  Head: Normocephalic and atraumatic.  Eyes: EOM are normal.  Neck: Neck supple. No tracheal deviation present.  Cardiovascular: Normal rate, regular rhythm and normal heart sounds.   Pulmonary/Chest: Effort normal and breath sounds normal. No  respiratory distress. She has no wheezes.  Abdominal: Soft. There is no tenderness. There is no rebound and no guarding.  Decreased bowel sounds.   Musculoskeletal: Normal range of motion.  Neurological: She is alert and oriented to person, place, and time.  Skin: Skin is warm and dry.  Psychiatric: She has a normal mood and affect. Her behavior is normal.    ED Course  Procedures (including critical care time)  DIAGNOSTIC STUDIES: Oxygen Saturation is 96% on room air, normal by my interpretation.    COORDINATION OF CARE:  11:14 PM- Discussed treatment plan with patient, and the patient agreed to the plan.   Labs Review Results for orders placed during the hospital encounter of 05/02/13  CBC WITH DIFFERENTIAL      Result Value Range   WBC 11.9 (*) 4.0 - 10.5 K/uL   RBC 4.24  3.87 - 5.11 MIL/uL   Hemoglobin 13.2  12.0 - 15.0 g/dL   HCT 13.0  86.5 - 78.4 %   MCV 86.8  78.0 - 100.0 fL   MCH 31.1  26.0 - 34.0 pg   MCHC 35.9  30.0 - 36.0 g/dL   RDW 69.6  29.5 - 28.4 %   Platelets 175  150 - 400 K/uL   Neutrophils Relative % 86 (*) 43 - 77 %   Neutro Abs 10.2 (*) 1.7 - 7.7 K/uL   Lymphocytes Relative 8 (*) 12 - 46 %   Lymphs Abs 0.9  0.7 - 4.0 K/uL   Monocytes Relative 6  3 - 12 %   Monocytes Absolute 0.7  0.1 - 1.0 K/uL   Eosinophils Relative 0  0 - 5 %   Eosinophils Absolute 0.0  0.0 - 0.7 K/uL   Basophils Relative 0  0 - 1 %   Basophils Absolute 0.0  0.0 - 0.1 K/uL  COMPREHENSIVE METABOLIC PANEL      Result Value Range   Sodium 129 (*) 135 - 145 mEq/L   Potassium 3.8  3.5 - 5.1 mEq/L   Chloride 94 (*) 96 - 112 mEq/L   CO2 24  19 - 32 mEq/L   Glucose, Bld 164 (*) 70 - 99 mg/dL   BUN 16  6 - 23 mg/dL   Creatinine, Ser 1.32  0.50 - 1.10 mg/dL   Calcium 9.1  8.4 - 44.0 mg/dL   Total Protein 6.7  6.0 - 8.3 g/dL   Albumin 3.6  3.5 - 5.2 g/dL   AST 29  0 - 37 U/L   ALT 26  0 - 35 U/L   Alkaline Phosphatase 64  39 - 117 U/L   Total Bilirubin 1.1  0.3 - 1.2 mg/dL   GFR  calc non Af Amer 74 (*) >90 mL/min   GFR calc Af Amer 86 (*) >90 mL/min  LIPASE, BLOOD      Result Value Range   Lipase 17  11 - 59 U/L  URINALYSIS, ROUTINE W REFLEX MICROSCOPIC      Result Value Range   Color,  Urine YELLOW  YELLOW   APPearance CLEAR  CLEAR   Specific Gravity, Urine 1.015  1.005 - 1.030   pH 7.5  5.0 - 8.0   Glucose, UA NEGATIVE  NEGATIVE mg/dL   Hgb urine dipstick NEGATIVE  NEGATIVE   Bilirubin Urine NEGATIVE  NEGATIVE   Ketones, ur NEGATIVE  NEGATIVE mg/dL   Protein, ur NEGATIVE  NEGATIVE mg/dL   Urobilinogen, UA 0.2  0.0 - 1.0 mg/dL   Nitrite NEGATIVE  NEGATIVE   Leukocytes, UA MODERATE (*) NEGATIVE  LACTIC ACID, PLASMA      Result Value Range   Lactic Acid, Venous 1.2  0.5 - 2.2 mmol/L  URINE MICROSCOPIC-ADD ON      Result Value Range   Squamous Epithelial / LPF FEW (*) RARE   WBC, UA 7-10  <3 WBC/hpf   Bacteria, UA FEW (*) RARE   Imaging Review Dg Abd Acute W/chest  05/03/2013   CLINICAL DATA:  Abdominal pain. Vomiting.  EXAM: ACUTE ABDOMEN SERIES (ABDOMEN 2 VIEW & CHEST 1 VIEW)  COMPARISON:  Chest x-ray 09/15/2008.  FINDINGS: Linear opacities in lung bases bilaterally (left greater than), favored to represent areas of subsegmental atelectasis or scarring. No definite consolidative airspace disease. No pleural effusions. No evidence pulmonary edema. Heart size is normal. Mediastinal contours are unremarkable. Atherosclerosis in the thoracic aorta. Orthopedic fixation hardware in the lower cervical spine.  Gas and stool are seen scattered throughout the colon extending to the level of the distal rectum. No pathologic distension of small bowel is noted. No gross evidence of pneumoperitoneum. Surgical clips project over the right upper quadrant of the abdomen, likely from prior cholecystectomy. Suture line present in the right side of the abdomen  IMPRESSION: 1. Nonobstructive bowel gas pattern. 2. No pneumoperitoneum. 3. No radiographic evidence acute cardiopulmonary  disease. 4. Atherosclerosis. 5. Postoperative changes, as above.   Electronically Signed   By: Trudie Reed M.D.   On: 05/03/2013 00:46   Images viewed by me.  MDM   1. Nausea & vomiting    And vomiting which seems most likely to be due 2 viral gastritis. Although she is complaining of lower abdominal pain, there is absolutely no tenderness on exam. She be given IV fluids, IV ondansetron and reassessed.  She continued to have significant nausea in spite of ondansetron. She's given IV metoclopramide with diphenhydramine with significantly better control of nausea. She was given oral fluids and tolerated them well. Laboratory workup is unremarkable and x-ray is negative for evidence of obstruction. She is discharged with prescription for metoclopramide.  I personally performed the services described in this documentation, which was scribed in my presence. The recorded information has been reviewed and is accurate.       Dione Booze, MD 05/03/13 [phone]

## 2013-05-02 NOTE — ED Notes (Addendum)
Vomiting,,onset 12n.  No diarrhea. Pain RLQ.   Fever.  Headache, face flushed.

## 2013-05-03 ENCOUNTER — Emergency Department (HOSPITAL_COMMUNITY): Payer: Medicare Other

## 2013-05-03 ENCOUNTER — Ambulatory Visit (HOSPITAL_COMMUNITY): Payer: Medicare Other

## 2013-05-03 DIAGNOSIS — R109 Unspecified abdominal pain: Secondary | ICD-10-CM | POA: Diagnosis not present

## 2013-05-03 DIAGNOSIS — R1110 Vomiting, unspecified: Secondary | ICD-10-CM | POA: Diagnosis not present

## 2013-05-03 LAB — URINALYSIS, ROUTINE W REFLEX MICROSCOPIC
Bilirubin Urine: NEGATIVE
Glucose, UA: NEGATIVE mg/dL
Hgb urine dipstick: NEGATIVE
Ketones, ur: NEGATIVE mg/dL
Nitrite: NEGATIVE
Protein, ur: NEGATIVE mg/dL
Specific Gravity, Urine: 1.015 (ref 1.005–1.030)
Urobilinogen, UA: 0.2 mg/dL (ref 0.0–1.0)
pH: 7.5 (ref 5.0–8.0)

## 2013-05-03 LAB — URINE MICROSCOPIC-ADD ON

## 2013-05-03 LAB — LACTIC ACID, PLASMA: Lactic Acid, Venous: 1.2 mmol/L (ref 0.5–2.2)

## 2013-05-03 MED ORDER — DIPHENHYDRAMINE HCL 50 MG/ML IJ SOLN
25.0000 mg | Freq: Once | INTRAMUSCULAR | Status: AC
  Administered 2013-05-03: 25 mg via INTRAVENOUS
  Filled 2013-05-03: qty 1

## 2013-05-03 MED ORDER — METOCLOPRAMIDE HCL 10 MG PO TABS: 10.0000 mg | ORAL_TABLET | Freq: Four times a day (QID) | ORAL | Status: DC | PRN

## 2013-05-03 MED ORDER — METOCLOPRAMIDE HCL 5 MG/ML IJ SOLN
10.0000 mg | Freq: Once | INTRAMUSCULAR | Status: AC
  Administered 2013-05-03: 10 mg via INTRAVENOUS
  Filled 2013-05-03: qty 2

## 2013-05-03 NOTE — ED Notes (Signed)
Patient given Ginger Ale per request for po fluid challenge.  Family remains with patient.

## 2013-05-03 NOTE — ED Notes (Signed)
Patient c/o increased nausea after going to radiology.

## 2013-05-04 LAB — URINE CULTURE: Colony Count: 3000

## 2013-05-08 ENCOUNTER — Telehealth: Payer: Self-pay | Admitting: Pulmonary Disease

## 2013-05-08 MED ORDER — PROMETHAZINE HCL 25 MG PO TABS: ORAL_TABLET | ORAL | Status: DC

## 2013-05-08 NOTE — Telephone Encounter (Signed)
Pt seen in ER on 05-02-13 for stomach issues; was given Reglan and told to follow up with SN as needed. Pt has OV scheduled with SN on 06-12-13; wants something sent to pharmacy for nausea. Please advise. Thanks.

## 2013-05-08 NOTE — Telephone Encounter (Signed)
Per SN---  Ok to send in phenergan 25 mg  #30  1 po every 4-6 hours as needed for nausea with 1 refill.  rx has been sent to the pharmacy and i have called and made pt aware.

## 2013-05-29 DIAGNOSIS — M25559 Pain in unspecified hip: Secondary | ICD-10-CM | POA: Diagnosis not present

## 2013-05-29 DIAGNOSIS — M48061 Spinal stenosis, lumbar region without neurogenic claudication: Secondary | ICD-10-CM | POA: Diagnosis not present

## 2013-06-04 ENCOUNTER — Telehealth: Payer: Self-pay | Admitting: Pulmonary Disease

## 2013-06-04 MED ORDER — HYDROCODONE-ACETAMINOPHEN 5-325 MG PO TABS: ORAL_TABLET | ORAL | Status: DC

## 2013-06-04 NOTE — Telephone Encounter (Signed)
I called [facility]. Pt last had norco last refilled 02/19/13 #90 x 0 refills. Please advise if okay to refill SN thanks

## 2013-06-04 NOTE — Telephone Encounter (Signed)
rx has been printed out and placed on SN cart to be signed. 

## 2013-06-04 NOTE — Telephone Encounter (Signed)
Called and spoke with pt and she is aware that rx has been placed in the mail. Nothing further is needed.

## 2013-06-11 ENCOUNTER — Ambulatory Visit (HOSPITAL_COMMUNITY): Payer: Medicare Other

## 2013-06-12 ENCOUNTER — Encounter: Payer: Medicare Other | Admitting: Gynecology

## 2013-06-12 ENCOUNTER — Ambulatory Visit (INDEPENDENT_AMBULATORY_CARE_PROVIDER_SITE_OTHER): Payer: Medicare Other | Admitting: Gynecology

## 2013-06-12 ENCOUNTER — Encounter: Payer: Self-pay | Admitting: Gynecology

## 2013-06-12 ENCOUNTER — Ambulatory Visit (HOSPITAL_COMMUNITY)
Admission: RE | Admit: 2013-06-12 | Discharge: 2013-06-12 | Disposition: A | Discharge status: Home / Self Care | Payer: Medicare Other | Source: Ambulatory Visit | Attending: Gynecology | Admitting: Gynecology

## 2013-06-12 ENCOUNTER — Encounter: Payer: Self-pay | Admitting: Pulmonary Disease

## 2013-06-12 ENCOUNTER — Other Ambulatory Visit: Payer: Self-pay | Admitting: Gynecology

## 2013-06-12 ENCOUNTER — Ambulatory Visit (INDEPENDENT_AMBULATORY_CARE_PROVIDER_SITE_OTHER): Payer: Medicare Other | Admitting: Pulmonary Disease

## 2013-06-12 ENCOUNTER — Other Ambulatory Visit (INDEPENDENT_AMBULATORY_CARE_PROVIDER_SITE_OTHER): Payer: Medicare Other

## 2013-06-12 VITALS — BP 118/76 | Ht 61.0 in | Wt 137.0 lb

## 2013-06-12 VITALS — BP 120/60 | HR 64 | Temp 98.5°F | Ht 61.0 in | Wt 138.6 lb

## 2013-06-12 DIAGNOSIS — K5730 Diverticulosis of large intestine without perforation or abscess without bleeding: Secondary | ICD-10-CM

## 2013-06-12 DIAGNOSIS — M545 Low back pain, unspecified: Secondary | ICD-10-CM

## 2013-06-12 DIAGNOSIS — M1990 Unspecified osteoarthritis, unspecified site: Secondary | ICD-10-CM

## 2013-06-12 DIAGNOSIS — I10 Essential (primary) hypertension: Secondary | ICD-10-CM

## 2013-06-12 DIAGNOSIS — K219 Gastro-esophageal reflux disease without esophagitis: Secondary | ICD-10-CM

## 2013-06-12 DIAGNOSIS — F411 Generalized anxiety disorder: Secondary | ICD-10-CM

## 2013-06-12 DIAGNOSIS — M8580 Other specified disorders of bone density and structure, unspecified site: Secondary | ICD-10-CM

## 2013-06-12 DIAGNOSIS — Z1231 Encounter for screening mammogram for malignant neoplasm of breast: Secondary | ICD-10-CM

## 2013-06-12 DIAGNOSIS — K5732 Diverticulitis of large intestine without perforation or abscess without bleeding: Secondary | ICD-10-CM | POA: Diagnosis not present

## 2013-06-12 DIAGNOSIS — N819 Female genital prolapse, unspecified: Secondary | ICD-10-CM | POA: Diagnosis not present

## 2013-06-12 DIAGNOSIS — M899 Disorder of bone, unspecified: Secondary | ICD-10-CM

## 2013-06-12 DIAGNOSIS — N952 Postmenopausal atrophic vaginitis: Secondary | ICD-10-CM | POA: Diagnosis not present

## 2013-06-12 DIAGNOSIS — E7800 Pure hypercholesterolemia, unspecified: Secondary | ICD-10-CM

## 2013-06-12 LAB — TSH: TSH: 1.12 u[IU]/mL (ref 0.35–5.50)

## 2013-06-12 LAB — T3, FREE: T3, Free: 2.7 pg/mL (ref 2.3–4.2)

## 2013-06-12 LAB — T4, FREE: Free T4: 0.91 ng/dL (ref 0.60–1.60)

## 2013-06-12 MED ORDER — MELOXICAM 15 MG PO TABS: 15.0000 mg | ORAL_TABLET | Freq: Every day | ORAL | Status: DC | PRN

## 2013-06-12 MED ORDER — FLUTICASONE PROPIONATE 50 MCG/ACT NA SUSP: NASAL | Status: DC

## 2013-06-12 MED ORDER — LISINOPRIL 10 MG PO TABS: ORAL_TABLET | ORAL | Status: DC

## 2013-06-12 NOTE — Progress Notes (Signed)
Alison Cuevas 1946-02-10 [id number]        67 y.o.  [id number] for followup exam.  Several issues noted below.  Past medical history,surgical history, problem list, medications, allergies, family history and social history were all reviewed and documented in the EPIC chart.  ROS:  Performed and pertinent positives and negatives are included in the history, assessment and plan .  Exam: [name] assistant Filed Vitals:   06/12/13 1205  BP: 118/76  Height: 5\' 1"  (1.549 m)  Weight: 137 lb (62.143 kg)   General appearance  Normal Skin grossly normal Head/Neck normal with no cervical or supraclavicular adenopathy thyroid normal Lungs  clear Cardiac RR, without RMG Abdominal  soft, nontender, without masses, organomegaly or hernia Breasts  examined lying and sitting without masses, retractions, discharge or axillary adenopathy. Pelvic  Ext/BUS/vagina  atrophic changes with cystocele/enterocele/rectocele to the level of the introitus.   Adnexa  Without masses or tenderness    Anus and perineum  Normal   Rectovaginal  Normal sphincter tone without palpated masses or tenderness.    Assessment/Plan:  67 y.o. [id number] female for followup exam.   1. Enterocele/cystocele/rectocele. Patient still bothered with this. Had seen Dr. Terrilee Files previously and had a Gellhorn pessary which she could feel and did not like the way it felt. We discussed trying a different pessary and ultimately she wanted a different trial. We started with ring pessaries but she did still feel these and ultimately we fitted her with a #4 cube which seemed to support her and was comfortable to her after ambulating and squatting. We'll go ahead and order the number for cube and she'll return for placement and we'll see how she does. Check baseline urinalysis today. 2. History of osteopenia on DEXA 2008. DEXA 2011 was normal. We'll plan a repeat in a year or 2. Increase calcium and vitamin D reviewed. 3. Pap smear 2013. No Pap smear done  today. No history of significant abnormal Pap smears. Reviewed current screening guidelines. Over the age of 74 and status post hysterectomy for benign indications. Options to stop screening altogether or less frequent screening intervals reviewed. Will readdress on an annual basis. 4. Mammography due now and patient knows to schedule. SBE monthly reviewed. Colonoscopy 5 years ago. Will repeat at their recommended interval. 5. Health maintenance. No blood work done as this is all done through her primary physician's office. Followup one year, sooner as needed.   Note: This document was prepared with digital dictation and possible smart phrase technology. Any transcriptional errors that result from this process are unintentional.   Dara Lords MD, 12:49 PM 06/12/2013

## 2013-06-12 NOTE — Patient Instructions (Signed)
Today we updated your med list in our EPIC system...    Continue your current medications the same...    We refilled the meds you requested...  Today we rechecked your full thyroid panel...    We will contact you w/ the results when available...   Call for any questions...  Let's plan a follow up visit in 69mo, sooner if needed for problems.Marland KitchenMarland Kitchen

## 2013-06-12 NOTE — Patient Instructions (Signed)
Office will call you when the pessary arrives for placement 

## 2013-06-12 NOTE — Progress Notes (Signed)
Subjective:    Patient ID: Alison Cuevas, female    DOB: [birth year], 67 y.o.   MRN: [id number]  HPI 67 y/o WF here for a follow up office visit... he has multiple medical problems as noted below...   ~  December 15, 2011:  34mo ROV & Alison Cuevas has had 4-5 visits w/ GYN, DrFontaine for her organ prolapse w/ cystocele, enterocele, rectocele; they have decided on pessary rx & she was fitted- now waiting on the device to arrive for insertion;  She remains under stress w/ "Pete's" medical issues, back pain, etc...    Allergies> on OTC antihist, Benedryl, Flonase, Mucinex, etc...    HBP> on Lisin10, Verap180; BP= 122/68 and she denies CP, palpit, SOB, edema, etc...    Chol> on diet + FishOil; f/u FLP last week showed TChol 161, TG 93, HDL 48, LDL 95    GI- GERD. Divertics> on Miralax; she had a right hemicolectomy in 1993, last colon was 11/07 by DrPatterson w/ normal left colon...    GYN- pelvic organ prolapse> per DrFontaine as noted...    DJD, FM, neck&back pain> on Flex5Tid & Mobic15 as needed; she requests stronger prn pain med & we wrote for Vicodin Q8h prn...    Anxiety> she is under a lot of stress; uses Xanax 0.5mg  prn... We reviewed prob list, meds, xrays and labs> see below>> LABS 6/13:  FLP- at goals on diet;  Chems- wnl;  CBC- wnl;  TSH=1.32;  VitD=38 on 2000u daily...  ~  June 14, 2012:  52mo ROV & Alison Cuevas is under treatment for a UTI now from her GYN; notes excess sleepy & no energy; otherw stable, doing well & requests Rx for Shingles vaccine...  We reviewed the following medical problems during today's office visit >>     Allergies> on OTC antihist, Benedryl, Flonase, Mucinex, etc...    HBP> on Lisin10, Verap180; BP= 124/82 and she denies CP, palpit, SOB, edema, etc...    Chol> on diet + FishOil; f/u FLP 6/13 showed TChol 161, TG 93, HDL 48, LDL 95    GI- GERD. Divertics> on Miralax; she had a right hemicolectomy in 1993, last colon was 11/07 by DrPatterson w/ normal left colon...    GYN-  pelvic organ prolapse> per DrFontaine as noted he was treating her w/ a pessary...    DJD, FM, neck&back pain> on Flex5Tid & Mobic15 as needed; she requested stronger prn pain med & we wrote for Vicodin Q8h prn...    Anxiety> she is under a lot of stress; uses Xanax 0.5mg  prn... We reviewed prob list, meds, xrays and labs> see below for updates >> she declines the 2013 Flu vaccine; requests several refill presecriptions...  ~  December 12, 2012:  52mo ROV & Alison Cuevas's CC is LBP radiating into her hips- she has been eval by DrPoole & MRI is pending; currently taking Mobic15, VicodinTid prn, Flexeril5Tid prn;  Her other major issue is pelvic organ prolapse> followed by DrFontaine> using pessary but it's hard to fit 7 she may need surgery (they favor Rx at [facility])... We reviewed the following medical problems during today's office visit >>     Allergies> on OTC antihist, Benedryl, Flonase, Mucinex, etc; reasonably well controlled...    HBP> on Lisin10, Verap180; BP= 120/70 and she denies CP, palpit, SOB, edema, etc...    Chol> on diet + FishOil & OTC Niacin; FLP 6/14 shows TChol 187, TG 80, HDL 49, LDL 123; we reviewed diet & exercise...    GI- GERD.  Divertics> on Miralax; she had a right hemicolectomy in 1993, last colon was 11/07 by DrPatterson w/ normal left colon...    GYN- pelvic organ prolapse> per DrFontaine as noted he was treating her w/ a pessary=> she tells me she may need surg...    DJD, FM, neck&back pain> on VicodinTid, Flex5Tid & Mobic15 as needed; she is being evaluated by DrPoole & MRI pending...    Anxiety> she is under a lot of stress; uses Xanax 0.5mg  prn... We reviewed prob list, meds, xrays and labs> see below for updates >>  LABS 6/14:  FLP- ok on FishOil & Niacin x LDL=123;  Chems- wnl;  CBC- wnl;  TSH=1.42   ~  June 12, 2013:  93mo ROV & Alison Cuevas indicates that she's "been thru the [employer] but doing OK now;  It all started w/ [facility]- ?reaction, ?the flu, & went to the ER  w/ a GI bug "thought I would die"; she has since seen DrFontaine, GYN w/ pelvic prolapse; and DrPool w/ LBP, & a pinched nerve causing right hip pain; she was given a shot but says it caused BP to go up & didn't help the pain much; then she was checked by [facility]- she didn't think that more therapy would help & wanted to start a stronger pain med but pt refused it;  Finally she is concerned for hair loss & we discussed Telogen effluvium but she would like Derm consult & check her thyroid blood tests...     Allergies> on OTC antihist, Benedryl, Flonase, Mucinex, etc; reasonably well controlled...    HBP> on Lisin10, Verap180; BP= 120/60 and she denies CP, palpit, SOB, edema, etc...    Chol> on diet + FishOil; FLP 6/14 shows TChol 187, TG 80, HDL 49, LDL 123; we reviewed diet & exercise...    GI- GERD. Divertics> on Miralax & phenergan prn; she had a right hemicolectomy in 1993, last colon was 11/07 by DrPatterson w/ normal left colon; also s/p LapChole & TAH...    GYN- pelvic organ prolapse> per DrFontaine as noted he was treating her w/ a pessary=> she tells me she may need surg...    DJD, FM, neck&back pain> on VicodinTid, Flex5Tid & Mobic15 as needed; eval by DrPoole/ DrVoytek w/ MRI showing pinched nerve but not much relief from shot...    Anxiety> she is under a lot of stress; uses Xanax 0.5mg  prn... We reviewed prob list, meds, xrays and labs> see below for updates >>  LABS 11/14 (ER) showed Chems- ok x Na=129, BS=164;  CBC- wnl x WBC=11.9 LABS 12/14:  TSH=1.12;  FreeT3=2.7 (2.3-4.2);  FreeT4= 0.91 (0.6-1.6)...           Problem List:   ALLERGY (ICD-995.3) - she has allergic rhinitis symptoms and increased difficulty w/ the warmer weather... she's been on LORATIDINE 10mg /d, plus saline nasal spray during the day, and FLONASE in each nostril Qhs... she uses Mucinex OTC as needed and had DrCNewman wash out her ears... ~  f/u eval DrCNewman 12/10 for "crazy feelin in my head"- rec continued  Saline lavage, Flonase, etc...  HYPERTENSION (ICD-401.9) - controlled on VERAPAMIL SR 180mg /d + LISINOPRIL 10mg /d...  ~  CXR 3/10 showed normal heart size, clear lungs, s/p CSpine fusion... ~  1/11:  BP=128/70 and similar at home; denies HA, visual changes, CP, palipit, dizziness, syncope, dyspnea, edema, etc... both meds [facility] list and she is pleased w/ the results... ~  5/12:  BP=  112/74 & she remains asymptomatic... ~  11/12:  BP= 126/70 & she will continue diet & same meds... ~  6/13:  BP= 122/68 and she denies CP, palpit, SOB, edema, etc...  ~  6/14: on Lisin10, Verap180; BP= 120/70 and she denies CP, palpit, SOB, edema, etc. ~  12/14: on same 2 meds and BP= 120/60  HYPERCHOLESTEROLEMIA (ICD-272.0) - on diet alone... ~  FLP 3/09 showed Tchol 202, Tg 56, HDL 44, LDL 137... rec- diet efforts & get wt down... ~  FLP 9/09 showed TChol 183, TG 78, HDL 37, LDL 131... ~  FLP 3/10 (wt=146#) showed TChol 169, TG 47, HDL 51, LDL 108... improved on diet Rx... ~  FLP 1/11 (wt=149#) showed TChol 172, TG 66, HDL 46, LDL 113 ~  FLP 5/12 (wt=156#) showed TChol 168, TG 70, HDL 48, LDL 106 ~  FLP 6/13 (wt=156#) showed TChol 161, TG 93, HDL 48, LDL 95 ~  FLP 6/14 (wt=155#) showed TChol 187, TG 80, HDL 49, LDL 123   GERD (ICD-530.81) - mild reflux symptoms.. she uses OTC Prilosec as needed.  Hx of DIVERTICULITIS OF COLON (ICD-562.11) & IBS - she uses MIRALAX daily...s/p right hemicolectomy 10/93 by Lurline Hare... last colonoscopy was 11/07 by DrPatterson showing normal left colon... she's had adhesions and recurrent abd pain due to IBS- eval by GI/ DrPatterson...  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 (referred by DrFontaine)... ~  7/10: eval by DrMacDiarmid for Urology- he rec watchful waiting at this time... ~  12/10: she had f/u DrFontaine for GYN- he plans BMD as well... ~  We don't have any of this data> BMD per DrFontaine/  GYN; GU per DrMacDiarmid & he rec holding off on surg if it isn't bothering her... ~  BMD 1/11 per GYN was wnl w/ TScore -0.8 in left FemNeck... ~  6/13:  Review in EPIC shows 4-5 visits w/ DrFontaine recently & plans for Pessary trial; VitD=38 on OTC supplements. ~  12/13: note from DrFontaine- enterocele/ rectocele/ cystocele- pessary replaced... ~  6/14: she remains under the care of DrFontaine but she tells me she may need surg...  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 she has been followed by DrPool for neurosurgery- she had a 3 level anterior cervical diskectomy and fusion w/ plating 10/03... 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 She says DrPool has indicated that she needs surg w/ lumbar rods etc but she is holding off... ~  1/11:  BMD from DrFontaine showed TScores 0.0 in Spine & -0.8 in left FemNeck... ~  6/13:  She is c/o increased LBP despite Flexeril/ Mobic; we wrote for VICODIN Q8h as needed for severe pain... ~  6/14 & 12/14: on VicodinTid, Flex5Tid & Mobic15 as needed; she is being evaluated by DrPoole & MRI pending  ANXIETY (ICD-300.00) - on ALPRAZOLAM 0.5mg  as needed... she was anxious about poss AAA (+ in one of her parents)- we did an AbdSonar 3/09 that was neg.  Health Maintenance - GYN= DrFontaine for PAP, BMD (wnl 1/11), and Mammograms at [facility]... she takes Calcium, women's MVI, Vit D... She had PNEUMOVAX 11/11 at age 29.   Past Surgical History  Procedure Laterality Date  . Right hemicolectomy for diverticulitis with abscess  1993  . Abdominal hysterectomy  TAH  . Resection of colon      BENIGN TUMOR  . Tubal ligation    . Cholecystectomy, laparoscopic    . Cspine surgery      Dr. Estelle Grumbles level ant cerv diskectomy /fusion w/plating  . Cholecystectomy      Outpatient Encounter  Prescriptions as of 06/12/2013  Medication Sig  . ALPRAZolam (XANAX) 0.5 MG tablet TAKE 1/2 TO 1 TABLET 3 TIMES A DAY AS NEEDED FOR NERVES  . aspirin 81 MG tablet Take 81 mg by mouth daily.  . B Complex-C (SUPER B COMPLEX PO) Take by mouth daily.    Marland Kitchen BIOTIN PO Take by mouth daily.  . Calcium Carbonate-Vitamin D (CALTRATE 600+D) 600-400 MG-UNIT per tablet Take 1 tablet by mouth daily.    . Cholecalciferol (VITAMIN D) 2000 UNITS CAPS Take 1 capsule by mouth daily.    . clindamycin (CLEOCIN) 2 % vaginal cream Place 1 applicator vaginally at bedtime as needed.  . clobetasol cream (TEMOVATE) 0.05 % Apply topically 2 (two) times daily as needed.  . Cranberry 1000 MG CAPS Take 2 capsules by mouth daily.   . fluticasone (FLONASE) 50 MCG/ACT nasal spray PLACE 2 SPRAYS INTO THE NOSE AT BEDTIME AS NEEDED.  Marland Kitchen guaiFENesin (MUCINEX) 600 MG 12 hr tablet Take 1,200 mg by mouth 2 (two) times daily as needed.   Marland Kitchen HYDROcodone-acetaminophen (NORCO/VICODIN) 5-325 MG per tablet Take 1/2 to 1 tablet by mouth three times daily as needed for pain.  NOT TO EXCEED THREE PER DAY.  Marland Kitchen lisinopril (PRINIVIL,ZESTRIL) 10 MG tablet TAKE 1 TABLET BY MOUTH ONCE A DAY  . meloxicam (MOBIC) 15 MG tablet Take 1 tablet (15 mg total) by mouth daily as needed for pain.  . Multiple Vitamins-Minerals (WOMENS MULTIVITAMIN PLUS PO) Take 1 tablet by mouth daily.    . niacin 250 MG tablet Take 250 mg by mouth daily with breakfast.  . nystatin-triamcinolone (MYCOLOG II) cream Apply topically 4 (four) times daily as needed.  . polyethylene glycol powder (GLYCOLAX/MIRALAX) powder Take 17 g by mouth daily as needed.    . promethazine (PHENERGAN) 25 MG tablet Take 1 tablet by mouth every 4-6 hours as needed for nausea  . verapamil (CALAN-SR) 180 MG CR tablet Take 1 tablet (180 mg total) by mouth at bedtime.  . vitamin B-12 (CYANOCOBALAMIN) 1000 MCG tablet Take 1,000 mcg by mouth daily.    Marland Kitchen VITAMIN E PO Take by mouth daily.   . [DISCONTINUED]  clindamycin (CLEOCIN) 2 % vaginal cream Place 1 Applicatorful vaginally at bedtime.  . [DISCONTINUED] clobetasol cream (TEMOVATE) 0.05 % Apply topically 2 (two) times daily.  . [DISCONTINUED] lisinopril (PRINIVIL,ZESTRIL) 10 MG tablet Take 1 tablet (10 mg total) by mouth daily.  . [DISCONTINUED] nystatin-triamcinolone (MYCOLOG II) cream Apply topically 4 (four) times daily.  . [DISCONTINUED] diphenhydrAMINE (BENADRYL) 25 MG tablet Take 25 mg by mouth at bedtime as needed.  . [DISCONTINUED] fish oil-omega-3 fatty acids 1000 MG capsule Take 2 g by mouth daily.    . [DISCONTINUED] metoCLOPramide (REGLAN) 10 MG tablet Take 1 tablet (10 mg total) by mouth every 6 (six) hours as needed for nausea.    Allergies  Allergen Reactions  . Prednisone     REACTION: funny feeling    Current Medications, Allergies, Past Medical History, Past Surgical History, Family History, and Social History were reviewed in [employer] record.   Review of Systems        See HPI - all other systems neg  except as noted... The patient complains of headaches.  The patient denies anorexia, fever, weight loss, weight gain, vision loss, decreased hearing, hoarseness, chest pain, syncope, dyspnea on exertion, peripheral edema, prolonged cough, hemoptysis, abdominal pain, melena, hematochezia, severe indigestion/heartburn, hematuria, incontinence, muscle weakness, suspicious skin lesions, transient blindness, difficulty walking, depression, unusual weight change, abnormal bleeding, enlarged lymph nodes, and angioedema.     Objective:   Physical Exam     WD, WN, 67 y/o WF in NAD... GENERAL:  Alert & oriented; pleasant & cooperative... HEENT:  [place]/AT, EOM-wnl, PERRLA, EACs- min wax, NOSE- neg, THROAT- clear & wnl. NECK:  Supple w/ decrROM & ant cx scar; no JVD; normal carotid impulses w/o bruits; no thyromegaly or nodules palpated; no lymphadenopathy. CHEST:  Clear to P & A; without wheezes/ rales/ or  rhonchi heard... HEART:  Regular Rhythm; without murmurs/ rubs/ or gallops detected... ABDOMEN:  Soft & nontender; normal bowel sounds; no organomegaly or masses detected. EXT: without deformities, mild arthritic changes; no varicose veins/ +venous insuffic/ no edema. NEURO:  CN's intact; no focal neuro deficits; normal DTR's... DERM:  No lesions noted; no rash etc...  RADIOLOGY DATA:  Reviewed in the EPIC EMR & discussed w/ the patient...  LABORATORY DATA:  Reviewed in the EPIC EMR & discussed w/ the patient...   Assessment & Plan:    HBP>  Controlled on meds, continue same...  HYPERLIPID>  Stable on diet alone, rec to incr exercise as well...  GI>  GERD, Divertics, IBS>  Stable on Omep + Miralax, etc...  GU>  Pelvic organ prolapse>  eval & management by DrFontaine for GYN (pessary trial ongoing) & DrMacDiarmid for Urology...  DJD/ Neck & Back Pain>  Managed by DrPool for NS & DrVoytek for Ortho; she is holding off on surg; MRI showed pinched nerve per pt; we filled VICODIN per request...  ANXIETY>  Stable on Alpraz as needed...   Patient's Medications  New Prescriptions   No medications on file  Previous Medications   ALPRAZOLAM (XANAX) 0.5 MG TABLET    TAKE 1/2 TO 1 TABLET 3 TIMES A DAY AS NEEDED FOR NERVES   ASPIRIN 81 MG TABLET    Take 81 mg by mouth daily.   B COMPLEX-C (SUPER B COMPLEX PO)    Take by mouth daily.     BIOTIN PO    Take by mouth daily.   CALCIUM CARBONATE-VITAMIN D (CALTRATE 600+D) 600-400 MG-UNIT PER TABLET    Take 1 tablet by mouth daily.     CHOLECALCIFEROL (VITAMIN D) 2000 UNITS CAPS    Take 1 capsule by mouth daily.     CRANBERRY 1000 MG CAPS    Take 2 capsules by mouth daily.    GUAIFENESIN (MUCINEX) 600 MG 12 HR TABLET    Take 1,200 mg by mouth 2 (two) times daily as needed.    HYDROCODONE-ACETAMINOPHEN (NORCO/VICODIN) 5-325 MG PER TABLET    Take 1/2 to 1 tablet by mouth three times daily as needed for pain.  NOT TO EXCEED THREE PER DAY.    MULTIPLE VITAMINS-MINERALS (WOMENS MULTIVITAMIN PLUS PO)    Take 1 tablet by mouth daily.     NIACIN 250 MG TABLET    Take 250 mg by mouth daily with breakfast.   POLYETHYLENE GLYCOL POWDER (GLYCOLAX/MIRALAX) POWDER    Take 17 g by mouth daily as needed.     PROMETHAZINE (PHENERGAN) 25 MG TABLET    Take 1 tablet by mouth every 4-6 hours as needed for nausea  VERAPAMIL (CALAN-SR) 180 MG CR TABLET    Take 1 tablet (180 mg total) by mouth at bedtime.   VITAMIN B-12 (CYANOCOBALAMIN) 1000 MCG TABLET    Take 1,000 mcg by mouth daily.     VITAMIN E PO    Take by mouth daily.   Modified Medications   Modified Medication Previous Medication   CLOBETASOL CREAM (TEMOVATE) 0.05 % clobetasol cream (TEMOVATE) 0.05 %      Apply topically 2 (two) times daily as needed.    Apply topically 2 (two) times daily.   FLUTICASONE (FLONASE) 50 MCG/ACT NASAL SPRAY fluticasone (FLONASE) 50 MCG/ACT nasal spray      PLACE 2 SPRAYS INTO THE NOSE AT BEDTIME AS NEEDED.    PLACE 2 SPRAYS INTO THE NOSE AT BEDTIME AS NEEDED.   LISINOPRIL (PRINIVIL,ZESTRIL) 10 MG TABLET lisinopril (PRINIVIL,ZESTRIL) 10 MG tablet      TAKE 1 TABLET BY MOUTH ONCE A DAY    TAKE 1 TABLET BY MOUTH ONCE A DAY   MELOXICAM (MOBIC) 15 MG TABLET meloxicam (MOBIC) 15 MG tablet      Take 1 tablet (15 mg total) by mouth daily as needed for pain.    Take 1 tablet (15 mg total) by mouth daily as needed for pain.   NYSTATIN-TRIAMCINOLONE (MYCOLOG II) CREAM nystatin-triamcinolone (MYCOLOG II) cream      Apply topically 4 (four) times daily as needed.    Apply topically 4 (four) times daily.  Discontinued Medications   CLINDAMYCIN (CLEOCIN) 2 % VAGINAL CREAM    Place 1 Applicatorful vaginally at bedtime.   CLINDAMYCIN (CLEOCIN) 2 % VAGINAL CREAM    Place 1 applicator vaginally at bedtime as needed.   DIPHENHYDRAMINE (BENADRYL) 25 MG TABLET    Take 25 mg by mouth at bedtime as needed.   FISH OIL-OMEGA-3 FATTY ACIDS 1000 MG CAPSULE    Take 2 g by mouth daily.      LISINOPRIL (PRINIVIL,ZESTRIL) 10 MG TABLET    Take 1 tablet (10 mg total) by mouth daily.   METOCLOPRAMIDE (REGLAN) 10 MG TABLET    Take 1 tablet (10 mg total) by mouth every 6 (six) hours as needed for nausea.

## 2013-06-13 LAB — URINALYSIS W MICROSCOPIC + REFLEX CULTURE
Bacteria, UA: NONE SEEN
Bilirubin Urine: NEGATIVE
Casts: NONE SEEN
Crystals: NONE SEEN
Glucose, UA: NEGATIVE mg/dL
Hgb urine dipstick: NEGATIVE
Ketones, ur: NEGATIVE mg/dL
Leukocytes, UA: NEGATIVE
Nitrite: NEGATIVE
Protein, ur: NEGATIVE mg/dL
Specific Gravity, Urine: 1.016 (ref 1.005–1.030)
Squamous Epithelial / LPF: NONE SEEN
Urobilinogen, UA: 0.2 mg/dL (ref 0.0–1.0)
pH: 6.5 (ref 5.0–8.0)

## 2013-06-18 DIAGNOSIS — M76899 Other specified enthesopathies of unspecified lower limb, excluding foot: Secondary | ICD-10-CM | POA: Diagnosis not present

## 2013-06-28 ENCOUNTER — Other Ambulatory Visit: Payer: Self-pay | Admitting: Gynecology

## 2013-06-28 DIAGNOSIS — Z1231 Encounter for screening mammogram for malignant neoplasm of breast: Secondary | ICD-10-CM

## 2013-07-12 ENCOUNTER — Other Ambulatory Visit: Payer: Self-pay | Admitting: Pulmonary Disease

## 2013-07-18 ENCOUNTER — Telehealth: Payer: Self-pay | Admitting: Pulmonary Disease

## 2013-07-18 DIAGNOSIS — F411 Generalized anxiety disorder: Secondary | ICD-10-CM

## 2013-07-18 DIAGNOSIS — M8580 Other specified disorders of bone density and structure, unspecified site: Secondary | ICD-10-CM

## 2013-07-18 DIAGNOSIS — E7800 Pure hypercholesterolemia, unspecified: Secondary | ICD-10-CM

## 2013-07-18 DIAGNOSIS — M1990 Unspecified osteoarthritis, unspecified site: Secondary | ICD-10-CM

## 2013-07-18 DIAGNOSIS — I10 Essential (primary) hypertension: Secondary | ICD-10-CM

## 2013-07-18 DIAGNOSIS — D649 Anemia, unspecified: Secondary | ICD-10-CM

## 2013-07-18 DIAGNOSIS — K219 Gastro-esophageal reflux disease without esophagitis: Secondary | ICD-10-CM

## 2013-07-18 DIAGNOSIS — K5730 Diverticulosis of large intestine without perforation or abscess without bleeding: Secondary | ICD-10-CM

## 2013-07-18 NOTE — Telephone Encounter (Signed)
Per SN---  There are no other generics aval by RX.   Tell her to check her prescription formulary for 1st tier and 2 nd tier alternatives and we will call it in for her.  thanks

## 2013-07-18 NOTE — Telephone Encounter (Signed)
Spoke with pt. She reports flonase is now OTC per the pharmacy. Her insurance will not cover this and will cost her $63 per pt. She is wanting to know what else can we send for her that requires RX? Pt also aware SN will be retiring from [facility] this year. She would like to be referred to [facility]. I have placed referral. Do we need to cancel her June appt? Please advise SN thanks  Allergies  Allergen Reactions  . Prednisone     REACTION: funny feeling

## 2013-07-18 NOTE — Telephone Encounter (Signed)
LMTCb x1 w. spouse

## 2013-07-18 NOTE — Telephone Encounter (Signed)
SN, in addition to Mindy's questions, please advise if okay to give rx for hydrocodone 5-325 Last given rx on 06/04/13  Thanks

## 2013-07-18 NOTE — Telephone Encounter (Signed)
Called and made pt aware. Pt also wants to know about her norco RX if she can have this refilled. Please advise SN thanks

## 2013-07-18 NOTE — Telephone Encounter (Signed)
Pt also wants a rx for hydrocodone written

## 2013-07-19 ENCOUNTER — Telehealth: Payer: Self-pay | Admitting: *Deleted

## 2013-07-19 MED ORDER — HYDROCODONE-ACETAMINOPHEN 5-325 MG PO TABS: ORAL_TABLET | ORAL | Status: DC

## 2013-07-19 NOTE — Telephone Encounter (Signed)
Alison Cuevas appt have been resched to see Dr. Anitra Lauth on the same date she  was to see Dr. Lenna Gilford. I have spoke with her and she is  aware of the appt   Pt has been set up to see [facility].  SN is aware and will send records for the pt

## 2013-07-19 NOTE — Telephone Encounter (Signed)
rx for the hydrocodone has been printed out and placed on SN cart to be signed.  Will call pt once this has been done.

## 2013-07-19 NOTE — Telephone Encounter (Signed)
Called and lmom

## 2013-07-22 ENCOUNTER — Telehealth: Payer: Self-pay | Admitting: Pulmonary Disease

## 2013-07-22 NOTE — Telephone Encounter (Signed)
Called and spoke with pt. She reports she needs a PA through her insurance for her flonase.  [phone] ID#: [id number] I called #. Spoke with [employer]. She advised me this does not need a PA. She did a test claim and it went through. Pt was not due for a refill until today. I called pt back and made her aware. Nothing further needed

## 2013-07-24 ENCOUNTER — Telehealth: Payer: Self-pay | Admitting: Pulmonary Disease

## 2013-07-24 NOTE — Telephone Encounter (Signed)
Per SN---  If she passed out  Having N/V/D and poor intake, she may need IVF.  Will need the ER to eval and give fluids.  Called and spoke with pts husband and he is aware of SN recs and he will take her to the ER for further eval.  He did state that the pt is in the bed now but is still feeling like she may pass out.  Advised him again that she will need to the ER to eval.

## 2013-07-24 NOTE — Telephone Encounter (Signed)
Called, spoke with pt's husband.  Reports pt started having nausea, vomiting, diarrhea, and cold sweats this morning about 2 hours ago.  Her legs are also cramping.  No fever.  Husband reports pt passed out while sitting on the commode for approx 3 minutes and was "out cold."  During this time, her head, arms, and legs where shaking.  Pt has only drank 2 cups of coffee so far today.  She has promethazine to use for nausea but hasn't used this yet.  Advised husband to have pt try the promethazine to help with the nausea and increase her fluids -- take small sips frequently.  He will have her try this.  Dr. Lenna Gilford, pls advise if you have further recs for pt.  Thank you.  Allergies  Allergen Reactions  . Prednisone     REACTION: funny feeling

## 2013-07-25 ENCOUNTER — Emergency Department (HOSPITAL_COMMUNITY)
Admission: EM | Admit: 2013-07-25 | Discharge: 2013-07-25 | Disposition: A | Discharge status: Home / Self Care | Payer: Medicare Other | Attending: Emergency Medicine | Admitting: Emergency Medicine

## 2013-07-25 ENCOUNTER — Encounter (HOSPITAL_COMMUNITY): Payer: Self-pay | Admitting: Emergency Medicine

## 2013-07-25 DIAGNOSIS — F411 Generalized anxiety disorder: Secondary | ICD-10-CM | POA: Diagnosis not present

## 2013-07-25 DIAGNOSIS — IMO0002 Reserved for concepts with insufficient information to code with codable children: Secondary | ICD-10-CM | POA: Insufficient documentation

## 2013-07-25 DIAGNOSIS — M1990 Unspecified osteoarthritis, unspecified site: Secondary | ICD-10-CM | POA: Diagnosis not present

## 2013-07-25 DIAGNOSIS — R404 Transient alteration of awareness: Secondary | ICD-10-CM | POA: Diagnosis not present

## 2013-07-25 DIAGNOSIS — E7800 Pure hypercholesterolemia, unspecified: Secondary | ICD-10-CM | POA: Diagnosis not present

## 2013-07-25 DIAGNOSIS — J069 Acute upper respiratory infection, unspecified: Secondary | ICD-10-CM | POA: Diagnosis not present

## 2013-07-25 DIAGNOSIS — R112 Nausea with vomiting, unspecified: Secondary | ICD-10-CM | POA: Insufficient documentation

## 2013-07-25 DIAGNOSIS — R197 Diarrhea, unspecified: Secondary | ICD-10-CM | POA: Insufficient documentation

## 2013-07-25 DIAGNOSIS — M899 Disorder of bone, unspecified: Secondary | ICD-10-CM | POA: Diagnosis not present

## 2013-07-25 DIAGNOSIS — Z8719 Personal history of other diseases of the digestive system: Secondary | ICD-10-CM | POA: Insufficient documentation

## 2013-07-25 DIAGNOSIS — I10 Essential (primary) hypertension: Secondary | ICD-10-CM | POA: Diagnosis not present

## 2013-07-25 DIAGNOSIS — H9209 Otalgia, unspecified ear: Secondary | ICD-10-CM | POA: Insufficient documentation

## 2013-07-25 DIAGNOSIS — R51 Headache: Secondary | ICD-10-CM | POA: Diagnosis not present

## 2013-07-25 DIAGNOSIS — Z7982 Long term (current) use of aspirin: Secondary | ICD-10-CM | POA: Diagnosis not present

## 2013-07-25 DIAGNOSIS — M949 Disorder of cartilage, unspecified: Secondary | ICD-10-CM | POA: Diagnosis not present

## 2013-07-25 DIAGNOSIS — Z79899 Other long term (current) drug therapy: Secondary | ICD-10-CM | POA: Diagnosis not present

## 2013-07-25 LAB — COMPREHENSIVE METABOLIC PANEL
ALT: 28 U/L (ref 0–35)
AST: 33 U/L (ref 0–37)
Albumin: 4.3 g/dL (ref 3.5–5.2)
Alkaline Phosphatase: 70 U/L (ref 39–117)
BUN: 17 mg/dL (ref 6–23)
CO2: 23 mEq/L (ref 19–32)
Calcium: 9.7 mg/dL (ref 8.4–10.5)
Chloride: 100 mEq/L (ref 96–112)
Creatinine, Ser: 0.95 mg/dL (ref 0.50–1.10)
GFR calc Af Amer: 70 mL/min — ABNORMAL LOW (ref >90)
GFR calc non Af Amer: 60 mL/min — ABNORMAL LOW (ref >90)
Glucose, Bld: 122 mg/dL — ABNORMAL HIGH (ref 70–99)
Potassium: 4.2 mEq/L (ref 3.7–5.3)
Sodium: 138 mEq/L (ref 137–147)
Total Bilirubin: 0.7 mg/dL (ref 0.3–1.2)
Total Protein: 7.8 g/dL (ref 6.0–8.3)

## 2013-07-25 LAB — CBC WITH DIFFERENTIAL/PLATELET
Basophils Absolute: 0 10*3/uL (ref 0.0–0.1)
Basophils Relative: 0 % (ref 0–1)
Eosinophils Absolute: 0 10*3/uL (ref 0.0–0.7)
Eosinophils Relative: 0 % (ref 0–5)
HCT: 41.9 % (ref 36.0–46.0)
Hemoglobin: 15.1 g/dL — ABNORMAL HIGH (ref 12.0–15.0)
Lymphocytes Relative: 26 % (ref 12–46)
Lymphs Abs: 1.7 10*3/uL (ref 0.7–4.0)
MCH: 31.4 pg (ref 26.0–34.0)
MCHC: 36 g/dL (ref 30.0–36.0)
MCV: 87.1 fL (ref 78.0–100.0)
Monocytes Absolute: 0.5 10*3/uL (ref 0.1–1.0)
Monocytes Relative: 8 % (ref 3–12)
Neutro Abs: 4.3 10*3/uL (ref 1.7–7.7)
Neutrophils Relative %: 66 % (ref 43–77)
Platelets: 198 10*3/uL (ref 150–400)
RBC: 4.81 MIL/uL (ref 3.87–5.11)
RDW: 13.3 % (ref 11.5–15.5)
WBC: 6.5 10*3/uL (ref 4.0–10.5)

## 2013-07-25 MED ORDER — ONDANSETRON 4 MG PO TBDP: 4.0000 mg | ORAL_TABLET | Freq: Three times a day (TID) | ORAL | Status: DC | PRN

## 2013-07-25 MED ORDER — SODIUM CHLORIDE 0.9 % IV BOLUS (SEPSIS)
1000.0000 mL | Freq: Once | INTRAVENOUS | Status: AC
  Administered 2013-07-25: 1000 mL via INTRAVENOUS

## 2013-07-25 NOTE — ED Provider Notes (Signed)
CSN: [id number]     Arrival date & time 07/25/13  1433 History  This chart was scribed for Carmin Muskrat, MD by Rolanda Lundborg, ED Scribe. This patient was seen in room APA03/APA03 and the patient's care was started at 6:09 PM.    Chief Complaint  Patient presents with  . Influenza   The history is provided by the patient. No language interpreter was used.   HPI Comments: Alison Cuevas is a 68 y.o. female with a h/o HTN who presents to the Emergency Department complaining of headache, ear pain, nausea, vomiting, and diarrhea onset yesterday. Pt states she lost consciousness yesterday and that this is normal for her when she gets cold symptoms. She reports feeling normal before the cold symptoms started. She is otherwise healthy. She had her flu shot this year. Her husband is sick at home. Pt is refusing CXR.    Past Medical History  Diagnosis Date  . Allergy history unknown   . Hypercholesteremia   . GERD (gastroesophageal reflux disease)   . Diverticulosis of colon   . DJD (degenerative joint disease)   . Anxiety   . Hypertension   . Osteopenia 05/2007    DEXA 2011 WNL  . Enterocele    Past Surgical History  Procedure Laterality Date  . Right hemicolectomy for diverticulitis with abscess  1993  . Abdominal hysterectomy      TAH  . Resection of colon      BENIGN TUMOR  . Tubal ligation    . Cholecystectomy, laparoscopic    . Cspine surgery      Dr. Wiliam Ke level ant cerv diskectomy /fusion w/plating  . Cholecystectomy     Family History  Problem Relation Age of Onset  . Hypertension Mother   . Breast cancer Sister 51  . Cancer Brother     melanoma  . Cancer Brother     prostate  . Lupus Sister   . Other Brother     lung disease   History  Substance Use Topics  . Smoking status: Never Smoker   . Smokeless tobacco: Never Used  . Alcohol Use: No   OB History   Grav Para Term Preterm Abortions TAB SAB Ect Mult Living   4 3   1     3      Review of Systems   Constitutional:       Per HPI, otherwise negative  HENT:       Per HPI, otherwise negative  Respiratory:       Per HPI, otherwise negative  Cardiovascular:       Per HPI, otherwise negative  Gastrointestinal: Negative for vomiting.  Endocrine:       Negative aside from HPI  Genitourinary:       Neg aside from HPI   Musculoskeletal:       Per HPI, otherwise negative  Skin: Negative.     Allergies  Prednisone  Home Medications   Current Outpatient Rx  Name  Route  Sig  Dispense  Refill  . ALPRAZolam (XANAX) 0.5 MG tablet      TAKE 1/2 TO 1 TABLET BY MOUTH 3 TIMES A DAY AS NEEDED   90 tablet   5   . aspirin 81 MG tablet   Oral   Take 81 mg by mouth daily.         . B Complex-C (SUPER B COMPLEX PO)   Oral   Take by mouth daily.           Marland Kitchen  BIOTIN PO   Oral   Take by mouth daily.         . Calcium Carbonate-Vitamin D (CALTRATE 600+D) 600-400 MG-UNIT per tablet   Oral   Take 1 tablet by mouth daily.           . Cholecalciferol (VITAMIN D) 2000 UNITS CAPS   Oral   Take 1 capsule by mouth daily.           . clobetasol cream (TEMOVATE) 0.05 %   Topical   Apply topically 2 (two) times daily as needed.         . Cranberry 1000 MG CAPS   Oral   Take 2 capsules by mouth daily.          . fluticasone (FLONASE) 50 MCG/ACT nasal spray      PLACE 2 SPRAYS INTO THE NOSE AT BEDTIME AS NEEDED.   16 g   6   . guaiFENesin (MUCINEX) 600 MG 12 hr tablet   Oral   Take 1,200 mg by mouth 2 (two) times daily as needed.          Marland Kitchen HYDROcodone-acetaminophen (NORCO/VICODIN) 5-325 MG per tablet      Take 1/2 to 1 tablet by mouth three times daily as needed for pain.  NOT TO EXCEED THREE PER DAY.   90 tablet   0   . lisinopril (PRINIVIL,ZESTRIL) 10 MG tablet      TAKE 1 TABLET BY MOUTH ONCE A DAY   90 tablet   3   . meloxicam (MOBIC) 15 MG tablet   Oral   Take 1 tablet (15 mg total) by mouth daily as needed for pain.   90 tablet   3   . Multiple  Vitamins-Minerals (WOMENS MULTIVITAMIN PLUS PO)   Oral   Take 1 tablet by mouth daily.           . niacin 250 MG tablet   Oral   Take 250 mg by mouth daily with breakfast.         . nystatin-triamcinolone (MYCOLOG II) cream   Topical   Apply topically 4 (four) times daily as needed.         . polyethylene glycol powder (GLYCOLAX/MIRALAX) powder   Oral   Take 17 g by mouth daily as needed.           . promethazine (PHENERGAN) 25 MG tablet      Take 1 tablet by mouth every 4-6 hours as needed for nausea   30 tablet   1   . verapamil (CALAN-SR) 180 MG CR tablet   Oral   Take 1 tablet (180 mg total) by mouth at bedtime.   90 tablet   3   . vitamin B-12 (CYANOCOBALAMIN) 1000 MCG tablet   Oral   Take 1,000 mcg by mouth daily.           Marland Kitchen VITAMIN E PO   Oral   Take by mouth daily.           BP 135/83  Pulse 107  Temp(Src) 98.8 F (37.1 C) (Oral)  Resp 20  Ht 5\' 1"  (1.549 m)  Wt 137 lb (62.143 kg)  BMI 25.90 kg/m2  SpO2 94%  LMP 06/27/1972 Physical Exam  Nursing note and vitals reviewed. Constitutional: She is oriented to person, place, and time. She appears well-developed and well-nourished. No distress.  HENT:  Head: Normocephalic and atraumatic.  Mouth/Throat: Uvula is midline and oropharynx is clear and moist.  Eyes: Conjunctivae and EOM are normal.  Cardiovascular: Normal rate and regular rhythm.   Pulmonary/Chest: Effort normal and breath sounds normal. No stridor. No respiratory distress.  Abdominal: She exhibits no distension. There is no tenderness.  Musculoskeletal: She exhibits no edema.  Neurological: She is alert and oriented to person, place, and time. No cranial nerve deficit.  Skin: Skin is warm and dry.  Psychiatric: She has a normal mood and affect.    ED Course  Procedures (including critical care time) Medications - No data to display  COORDINATION OF CARE: 6:20 PM- Discussed treatment plan with pt which includes IV fluids.  Pt agrees to plan.    Labs Review Labs Reviewed  CBC WITH DIFFERENTIAL - Abnormal; Notable for the following:    Hemoglobin 15.1 (*)    All other components within normal limits  COMPREHENSIVE METABOLIC PANEL - Abnormal; Notable for the following:    Glucose, Bld 122 (*)    GFR calc non Af Amer 60 (*)    GFR calc Af Amer 70 (*)    All other components within normal limits   Imaging Review No results found.  EKG Interpretation   None      7:56 PM Following 1 L of IV fluids patient states that she feels better.  She again reiterates that she is not interested in x-ray or additional evaluation.  She states that she will follow up with her primary care physician. MDM   1. URI (upper respiratory infection)    Patient presents with concerns of ongoing nausea, generalized discomfort, cough, congestion. On exam she is awake alert and afebrile.  Patient's labs are largely reassuring.  With the patient's description of fatigue, she received IV fluids.  Patient some improvement.  Patient deferred additional evaluation.  With stable vitals, labs, no evidence of distress, she was discharged to follow up with primary care physician for presumed upper respiratory infection likely secondary to viral infection.  Carmin Muskrat, MD 07/25/13 [phone]

## 2013-07-25 NOTE — Discharge Instructions (Signed)
As discussed, it is important that you follow up as soon as possible with your physician for continued management of your condition. ° °If you develop any new, or concerning changes in your condition, please return to the emergency department immediately. ° °

## 2013-07-25 NOTE — ED Notes (Addendum)
Headache, ears hurt, NVD. Onset yesterday.  Had syncopal episode yesterday.  Says she feels she is having visual hallucinations at times.

## 2013-07-28 ENCOUNTER — Telehealth: Payer: Self-pay | Admitting: Gynecology

## 2013-07-28 NOTE — Telephone Encounter (Signed)
Pt reports recent viral infection with incontinence of  Diarrhea last week.  She was wearing a pad in her underwear and had fecal material on it.  Pt now reports dysuria and pressure with void.  She used otc azo and had some relief.  cipro and pyridium called in, pt instructed to call office if not improved Monday

## 2013-07-29 ENCOUNTER — Ambulatory Visit (INDEPENDENT_AMBULATORY_CARE_PROVIDER_SITE_OTHER): Payer: Medicare Other | Admitting: Gynecology

## 2013-07-29 ENCOUNTER — Encounter: Payer: Self-pay | Admitting: Gynecology

## 2013-07-29 ENCOUNTER — Telehealth: Payer: Self-pay

## 2013-07-29 DIAGNOSIS — N390 Urinary tract infection, site not specified: Secondary | ICD-10-CM | POA: Diagnosis not present

## 2013-07-29 DIAGNOSIS — R300 Dysuria: Secondary | ICD-10-CM

## 2013-07-29 LAB — URINALYSIS W MICROSCOPIC + REFLEX CULTURE
Bilirubin Urine: NEGATIVE
Crystals: NONE SEEN
Glucose, UA: 100 mg/dL — AB
Nitrite: POSITIVE — AB
Protein, ur: NEGATIVE mg/dL
Specific Gravity, Urine: 1.015 (ref 1.005–1.030)
Urobilinogen, UA: 0.2 mg/dL (ref 0.0–1.0)
pH: 5 (ref 5.0–8.0)

## 2013-07-29 NOTE — Addendum Note (Signed)
Addended by: Nelva Nay on: 07/29/2013 04:08 PM   Modules accepted: Orders

## 2013-07-29 NOTE — Patient Instructions (Signed)
Finish the antibiotics twice daily as prescribed by the other doctor.

## 2013-07-29 NOTE — Telephone Encounter (Signed)
Patient called with terrible "vaginal burning".  In ER Thursday night with GI virus. Vomiting/diarrhea.  Called Dr. On call Sat evening with terrible burning and pain.  Received Rx's for UTI-Cipro and generic Pyridium.  Has had two doses and it still very uncomfortable and burning terrible. Patient asked if she should come in.  I told her that is the only way to rule out vaginal inf.  Transferred to appts to schedule.

## 2013-07-29 NOTE — Progress Notes (Signed)
Patient presents having been seen in the emergency room due to nausea and vomiting with diarrhea. Subsequently called yesterday Dr. Charlies Constable and was prescribed ciprofloxacin 500 mg twice a day for UTI for 7 days. Has taken one dose yesterday afternoon. Having a lot of epigastric discomfort contributing to her prior episode of nausea and vomiting although this seems to be getting better. Exam was [employer] HEENT normal.  Lungs clear.  Cardiac regular rate no rubs murmurs or gallops. Abdomen soft nontender without masses guarding rebound organomegaly. Pelvic external BUS vagina with atrophic changes. Enterocele/cystocele noted. Bimanual without masses or tenderness  Assessment and plan: UTI historically with urinalysis today showing 11-20 WBCs 3-6 RBC few bacteria. Recommended patient complete the course of ciprofloxacin as prescribed. Small meals as tolerated. Followup with primary physician if nausea or epigastric discomfort continues.

## 2013-07-31 ENCOUNTER — Telehealth: Payer: Self-pay | Admitting: Pulmonary Disease

## 2013-07-31 LAB — URINE CULTURE
Colony Count: NO GROWTH
Organism ID, Bacteria: NO GROWTH

## 2013-07-31 MED ORDER — AZITHROMYCIN 250 MG PO TABS: ORAL_TABLET | ORAL | Status: DC

## 2013-07-31 NOTE — Telephone Encounter (Signed)
Called, spoke with pt - C/o HA, sinus pressure, burning sensation in nostrils, and blowing bright red blood from nose x 2-3 days.  No f/c/s.  Started mucinex bid and flonase for symptoms - not helping.  Pt requesting zpak.  Dr. Lenna Gilford, pls advise.  Thank you.  Last OV with SN: 06/12/14  [facility]  Allergies verified with pt: Allergies  Allergen Reactions  . Prednisone     REACTION: funny feeling

## 2013-07-31 NOTE — Telephone Encounter (Signed)
Per SN---  Ok to call in zpak #1  Take as directed.

## 2013-07-31 NOTE — Telephone Encounter (Signed)
Spoke with pt. rx sent. Nothing further needed

## 2013-08-12 ENCOUNTER — Ambulatory Visit: Payer: Medicare Other | Admitting: Gynecology

## 2013-08-26 ENCOUNTER — Ambulatory Visit (INDEPENDENT_AMBULATORY_CARE_PROVIDER_SITE_OTHER): Payer: Medicare Other | Admitting: Gynecology

## 2013-08-26 ENCOUNTER — Encounter: Payer: Self-pay | Admitting: Gynecology

## 2013-08-26 DIAGNOSIS — N816 Rectocele: Secondary | ICD-10-CM | POA: Diagnosis not present

## 2013-08-26 DIAGNOSIS — K469 Unspecified abdominal hernia without obstruction or gangrene: Secondary | ICD-10-CM

## 2013-08-26 DIAGNOSIS — N8111 Cystocele, midline: Secondary | ICD-10-CM

## 2013-08-26 NOTE — Progress Notes (Signed)
Patient presents to have her #4 cube pessary placed. History of enterocele/rectocele/cystocele.  Exam was [name] Asst. External BUS vagina with cystocele/rectocele/enterocele. Atrophic changes noted. Bimanual without masses or tenderness.  Assessment and plan: Cystocele/rectocele/enterocele. #4 cube pessary placed without difficulty. Patient ambulated and it did well. Patient will followup in one month for recheck. Sooner if any issues.

## 2013-08-26 NOTE — Patient Instructions (Signed)
Followup in one month for pessary recheck. Sooner if any issues.

## 2013-09-02 ENCOUNTER — Encounter: Payer: Self-pay | Admitting: Gynecology

## 2013-09-02 ENCOUNTER — Ambulatory Visit (INDEPENDENT_AMBULATORY_CARE_PROVIDER_SITE_OTHER): Payer: Medicare Other | Admitting: Gynecology

## 2013-09-02 VITALS — BP 132/86

## 2013-09-02 DIAGNOSIS — N760 Acute vaginitis: Secondary | ICD-10-CM | POA: Diagnosis not present

## 2013-09-02 DIAGNOSIS — A499 Bacterial infection, unspecified: Secondary | ICD-10-CM | POA: Diagnosis not present

## 2013-09-02 DIAGNOSIS — B9689 Other specified bacterial agents as the cause of diseases classified elsewhere: Secondary | ICD-10-CM

## 2013-09-02 DIAGNOSIS — N898 Other specified noninflammatory disorders of vagina: Secondary | ICD-10-CM | POA: Diagnosis not present

## 2013-09-02 DIAGNOSIS — N819 Female genital prolapse, unspecified: Secondary | ICD-10-CM

## 2013-09-02 LAB — WET PREP FOR TRICH, YEAST, CLUE
Trich, Wet Prep: NONE SEEN
Yeast Wet Prep HPF POC: NONE SEEN

## 2013-09-02 MED ORDER — METRONIDAZOLE 500 MG PO TABS: 500.0000 mg | ORAL_TABLET | Freq: Two times a day (BID) | ORAL | Status: DC

## 2013-09-02 NOTE — Progress Notes (Addendum)
   In-year-old date complaining of a fall discharge vaginally since she had a cube pessary placed last week as a result of her pelvic organ prolapse. She called the answering service over the weekend and she tried Monistat for 2 days with no resolution.  Exam: Putrid vaginal smell was evident upon entering the room. The cube pessary was removed and copious foul-smelling grey like discharge was evident. Patient with cystocele/rectocele/enterocele and prior hysterectomy history.  A wet prep along with aerobic and anaerobic culture was obtained  Wet prep demonstrated the following: Pos Amine, Many clue cells, 2 numerous to count white blood cell, too numerous to count bacteria  A/P: Postmenopausal patient with pelvic organ prolapse with Cube  pessary had worked well for the past few days with the exception of the vaginal discharge for which bacteria vaginosis is clinically evident. Patient will hold off on reinsertion of the cube pessary until later next week when she returns for followup to see her primary Dr. Phineas Real. She'll be started on Flagyl 500 mg twice a day for 7 days. Aerobic and anaerobic vaginal cultures pending. Patient was instructed to bring her cube pessary for followup visit.

## 2013-09-02 NOTE — Addendum Note (Signed)
Addended by: Su Grand A on: 09/02/2013 03:20 PM   Modules accepted: Orders

## 2013-09-02 NOTE — Patient Instructions (Signed)
Metronidazole extended-release tablets What is this medicine? METRONIDAZOLE (me troe NI da zole) is an antiinfective. It is used to treat certain kinds of bacterial and protozoal infections. It will not work for colds, flu, or other viral infections. This medicine may be used for other purposes; ask your health care provider or pharmacist if you have questions. COMMON BRAND NAME(S): Flagyl ER What should I tell my health care provider before I take this medicine? They need to know if you have any of these conditions: -anemia or other blood disorders -disease of the nervous system -fungal or yeast infection -if you drink alcohol containing drinks -liver disease -seizures -an unusual or allergic reaction to metronidazole, or other medicines, foods, dyes, or preservatives -pregnant or trying to get pregnant -breast-feeding How should I use this medicine? Take this medicine by mouth with a full glass of water. Follow the directions on the prescription label. Do not crush or chew. Take this medicine on an empty stomach 1 hour before or 2 hours after meals or food. Take your medicine at regular intervals. Do not take your medicine more often than directed. Take all of your medicine as directed even if you think you are better. Do not skip doses or stop your medicine early. Talk to your pediatrician regarding the use of this medicine in children. Special care may be needed. Overdosage: If you think you have taken too much of this medicine contact a poison control center or emergency room at once. NOTE: This medicine is only for you. Do not share this medicine with others. What if I miss a dose? If you miss a dose, take it as soon as you can. If it is almost time for your next dose, take only that dose. Do not take double or extra doses. What may interact with this medicine? Do not take this medicine with any of the following medications: -alcohol or any product that contains alcohol -amprenavir  oral solution -cisapride -disulfiram -dofetilide -dronedarone -paclitaxel injection -pimozide -ritonavir oral solution -sertraline oral solution -sulfamethoxazole-trimethoprim injection -thioridazine -ziprasidone This medicine may also interact with the following medications: -cimetidine -lithium -other medicines that prolong the QT interval (cause an abnormal heart rhythm) -phenobarbital -phenytoin -warfarin This list may not describe all possible interactions. Give your health care provider a list of all the medicines, herbs, non-prescription drugs, or dietary supplements you use. Also tell them if you smoke, drink alcohol, or use illegal drugs. Some items may interact with your medicine. What should I watch for while using this medicine? Tell your doctor or health care professional if your symptoms do not improve or if they get worse. You may get drowsy or dizzy. Do not drive, use machinery, or do anything that needs mental alertness until you know how this medicine affects you. Do not stand or sit up quickly, especially if you are an older patient. This reduces the risk of dizzy or fainting spells. Avoid alcoholic drinks while you are taking this medicine and for three days afterward. Alcohol may make you feel dizzy, sick, or flushed. If you are being treated for a sexually transmitted disease, avoid sexual contact until you have finished your treatment. Your sexual partner may also need treatment. What side effects may I notice from receiving this medicine? Side effects that you should report to your doctor or health care professional as soon as possible: -allergic reactions like skin rash, itching or hives, swelling of the face, lips, or tongue -confusion, clumsiness -difficulty speaking -discolored or sore mouth -dizziness -fever,  infection -numbness, tingling, pain or weakness in the hands or feet -trouble passing urine or change in the amount of urine -redness, blistering,  peeling or loosening of the skin, including inside the mouth -seizures -unusually weak or tired -vaginal irritation, dryness, or discharge Side effects that usually do not require medical attention (report to your doctor or health care professional if they continue or are bothersome): -diarrhea -headache -irritability -metallic taste -nausea -stomach pain or cramps -trouble sleeping This list may not describe all possible side effects. Call your doctor for medical advice about side effects. You may report side effects to FDA at 1-800-FDA-1088. Where should I keep my medicine? Keep out of the reach of children. Store at room temperature between 15 and 30 degrees C (59 and 86 degrees F). Protect from light and moisture. Keep container tightly closed. Throw away any unused medicine after the expiration date. NOTE: This sheet is a summary. It may not cover all possible information. If you have questions about this medicine, talk to your doctor, pharmacist, or health care provider.  2014, Elsevier/Gold Standard. (2012-12-11 13:10:32)

## 2013-09-03 ENCOUNTER — Telehealth: Payer: Self-pay | Admitting: *Deleted

## 2013-09-03 NOTE — Telephone Encounter (Signed)
Temovate cream externally is okay.

## 2013-09-03 NOTE — Telephone Encounter (Signed)
Pt saw Dr. Toney Rakes on 09/02/13 treated for BV infection, given Flagyl 500 mg twice a day for 7 days. Pt said she is still having some internal vaginal burning as told in [place]. Pt asked if okay to place temovate cream outside vagina? Or other suggestions? Please advise

## 2013-09-03 NOTE — Telephone Encounter (Signed)
Pt informed

## 2013-09-05 ENCOUNTER — Telehealth: Payer: Self-pay

## 2013-09-05 ENCOUNTER — Other Ambulatory Visit: Payer: Self-pay | Admitting: Gynecology

## 2013-09-05 MED ORDER — DOXYCYCLINE HYCLATE 100 MG PO CAPS: 100.0000 mg | ORAL_CAPSULE | Freq: Two times a day (BID) | ORAL | Status: DC

## 2013-09-05 NOTE — Telephone Encounter (Signed)
Message copied by Ramond Craver on Thu Sep 05, 2013 11:12 AM ------      Message from: Terrance Mass      Created: Thu Sep 05, 2013 11:03 AM       Please inform patient that the culture that I had obtained when she was in the office with her vaginal discharge after having had the pessary put in a few days prior demonstrated an infection with an organism called  staph aureus. Please: Prescription for Vibramycin 100 mg twice a day for 2 weeks with food. ------

## 2013-09-05 NOTE — Telephone Encounter (Signed)
Patient informed. Appt made for noon tomorrow.

## 2013-09-05 NOTE — Telephone Encounter (Signed)
Please have her make an appointment to see Dr. Phineas Real he did this afternoon or tomorrow

## 2013-09-05 NOTE — Telephone Encounter (Signed)
Patient informed. She wanted me to let you know she is still really miserable. Burning inside is very bad.  She said it will stop but then if she tries to wear panties it starts up again. She asked if any kind of cream she could use intravaginally for the burning.  She asked if she should come back in tomorrow and see Dr. Loetta Rough?

## 2013-09-06 ENCOUNTER — Ambulatory Visit (INDEPENDENT_AMBULATORY_CARE_PROVIDER_SITE_OTHER): Payer: Medicare Other | Admitting: Gynecology

## 2013-09-06 ENCOUNTER — Encounter: Payer: Self-pay | Admitting: Gynecology

## 2013-09-06 DIAGNOSIS — B373 Candidiasis of vulva and vagina: Secondary | ICD-10-CM | POA: Diagnosis not present

## 2013-09-06 DIAGNOSIS — R300 Dysuria: Secondary | ICD-10-CM

## 2013-09-06 DIAGNOSIS — B3731 Acute candidiasis of vulva and vagina: Secondary | ICD-10-CM

## 2013-09-06 DIAGNOSIS — N819 Female genital prolapse, unspecified: Secondary | ICD-10-CM | POA: Diagnosis not present

## 2013-09-06 LAB — URINALYSIS W MICROSCOPIC + REFLEX CULTURE
Bilirubin Urine: NEGATIVE
Casts: NONE SEEN
Crystals: NONE SEEN
Glucose, UA: NEGATIVE mg/dL
Hgb urine dipstick: NEGATIVE
Ketones, ur: NEGATIVE mg/dL
Nitrite: NEGATIVE
Protein, ur: NEGATIVE mg/dL
Specific Gravity, Urine: <1.005 — ABNORMAL LOW (ref 1.005–1.030)
Urobilinogen, UA: 0.2 mg/dL (ref 0.0–1.0)
pH: 5.5 (ref 5.0–8.0)

## 2013-09-06 LAB — WOUND CULTURE: Gram Stain: NONE SEEN

## 2013-09-06 MED ORDER — FLUCONAZOLE 150 MG PO TABS: 150.0000 mg | ORAL_TABLET | Freq: Once | ORAL | Status: DC

## 2013-09-06 NOTE — Addendum Note (Signed)
Addended by: Nelva Nay on: 09/06/2013 03:12 PM   Modules accepted: Orders

## 2013-09-06 NOTE — Progress Notes (Signed)
Alison Cuevas 01/24/1946 [id number]        68 y.o.  G4P0013 presents having recently seen Dr. Toney Rakes 09/02/2013 with vaginal discharge and discomfort. She had a cube pessary placed the week prior did well initially but then started having discomfort. Was diagnosed with bacterial vaginosis and treated with Flagyl. Did have a rubicund anaerobic cultures also done which showed staph aureus. Her symptoms have resolved with just a residual burning/stinging around the urethra.  Past medical history,surgical history, problem list, medications, allergies, family history and social history were all reviewed and documented in the EPIC chart.  Exam: [name] assistant General appearance  Normal Abdomen soft nontender without masses guarding rebound Pelvic: External BUS vagina with atrophic changes. Pelvic prolapse noted. No significant discharge or erythema.  Assessment/Plan:  68 y.o. G4P0013 with resolving bacterial vaginosis. Urinalysis did show yeast. Going to cover her with Diflucan 150 mg x5 days. Followup urine culture. Assuming her symptoms totally cleared she is going to represent in several weeks for replacement and retry of her pessary.   Note: This document was prepared with digital dictation and possible smart phrase technology. Any transcriptional errors that result from this process are unintentional.   [name] MD, 11:54 AM 09/06/2013

## 2013-09-06 NOTE — Patient Instructions (Signed)
Take Diflucan pill daily for 5 days. Followup in several weeks to replace the pessary.

## 2013-09-07 LAB — URINE CULTURE: Colony Count: 6000

## 2013-09-09 ENCOUNTER — Telehealth: Payer: Self-pay | Admitting: *Deleted

## 2013-09-09 NOTE — Telephone Encounter (Signed)
Pt called with questions with cleaning pessary, all questions answered.

## 2013-09-10 ENCOUNTER — Ambulatory Visit: Payer: Medicare Other | Admitting: Gynecology

## 2013-09-11 ENCOUNTER — Ambulatory Visit: Payer: Medicare Other | Admitting: Gynecology

## 2013-09-12 ENCOUNTER — Telehealth: Payer: Self-pay | Admitting: *Deleted

## 2013-09-12 MED ORDER — SULFAMETHOXAZOLE-TMP DS 800-160 MG PO TABS: 1.0000 | ORAL_TABLET | Freq: Two times a day (BID) | ORAL | Status: DC

## 2013-09-12 NOTE — Telephone Encounter (Signed)
We have prescribed the Mycolog/Mytrex cream in the past. If she has any of this I would apply that externally. If not then we can prescribe it. I will do that twice daily. Would also recommend antibiotic Septra DS 1 by mouth twice a day x3 days in the event she has a UTI causing her to have the urinary symptoms. If her symptoms persist at some point we'll going to refer her to urology to consider vaginal supportive surgery

## 2013-09-12 NOTE — Telephone Encounter (Signed)
Pt informed with the below note, rx sent. 

## 2013-09-12 NOTE — Telephone Encounter (Signed)
Pt calling to follow up from [place] 09/06/13 pt has taken all the medication prescribed Diflucan 150 mg x5 days, Flagyl 500 mg twice a day for 7 days (prescribed by JF) and this am c/o outside vaginal burning. Also notes that she has urge to urinate but has to sit and wait for urine to pass, some times very little comes out. Drinking cranberry juice and yogurt to help prevent yeast. She does not have pessary in, pt would like some direction on what to do?

## 2013-09-25 ENCOUNTER — Encounter: Payer: Self-pay | Admitting: Gynecology

## 2013-09-25 ENCOUNTER — Ambulatory Visit (INDEPENDENT_AMBULATORY_CARE_PROVIDER_SITE_OTHER): Payer: Medicare Other | Admitting: Gynecology

## 2013-09-25 DIAGNOSIS — K469 Unspecified abdominal hernia without obstruction or gangrene: Secondary | ICD-10-CM | POA: Diagnosis not present

## 2013-09-25 DIAGNOSIS — N8111 Cystocele, midline: Secondary | ICD-10-CM | POA: Diagnosis not present

## 2013-09-25 NOTE — Progress Notes (Signed)
Alison Cuevas 04-May-1946 [id number]        68 y.o.  G4P0013 with history of enterocele, cystocele rectocele. Has been dealing with recurrent vaginal infections. Had cube pessary placed which gave her good support but apparently got a vaginal infection with this. Has been out now for the past month. Patient notes some irritation around the urethral opening, no discharge, itching, odor.  Past medical history,surgical history, problem list, medications, allergies, family history and social history were all reviewed and documented in the EPIC chart.  Exam: [name] assistant General appearance  Normal External BUS vagina with atrophic changes. No significant discharge or inflammation. Cystocele/rectocele/enterocele is noted previously.  Assessment/Plan:  68 y.o. G4P0013 vaginal irritation I think secondary to atrophic changes. No gross evidence of infection. Recommended estrogen cream 3 times weekly applied externally. Samples of both Premarin and Estrace cream given. Issues of absorption and risks to include thrombosis such as stroke heart attack DVT and breast cancer issues reviewed. Patient will follow up in one month. Assuming she does well then we'll consider replacement of the pessary.   Note: This document was prepared with digital dictation and possible smart phrase technology. Any transcriptional errors that result from this process are unintentional.   [name] MD, 2:19 PM 09/25/2013

## 2013-09-25 NOTE — Patient Instructions (Signed)
Applied the estrogen cream 3 times weekly to the outer part of the vagina. Followup with me in one month. Call sooner if any issues.

## 2013-09-30 ENCOUNTER — Ambulatory Visit: Payer: Medicare Other | Admitting: Gynecology

## 2013-10-22 DIAGNOSIS — M76899 Other specified enthesopathies of unspecified lower limb, excluding foot: Secondary | ICD-10-CM | POA: Diagnosis not present

## 2013-10-25 ENCOUNTER — Encounter: Payer: Self-pay | Admitting: Gynecology

## 2013-10-25 ENCOUNTER — Ambulatory Visit (INDEPENDENT_AMBULATORY_CARE_PROVIDER_SITE_OTHER): Payer: Medicare Other | Admitting: Gynecology

## 2013-10-25 DIAGNOSIS — N952 Postmenopausal atrophic vaginitis: Secondary | ICD-10-CM | POA: Diagnosis not present

## 2013-10-25 DIAGNOSIS — K469 Unspecified abdominal hernia without obstruction or gangrene: Secondary | ICD-10-CM

## 2013-10-25 DIAGNOSIS — R350 Frequency of micturition: Secondary | ICD-10-CM

## 2013-10-25 DIAGNOSIS — N898 Other specified noninflammatory disorders of vagina: Secondary | ICD-10-CM | POA: Diagnosis not present

## 2013-10-25 LAB — URINALYSIS W MICROSCOPIC + REFLEX CULTURE
Bilirubin Urine: NEGATIVE
Glucose, UA: NEGATIVE mg/dL
Hgb urine dipstick: NEGATIVE
Ketones, ur: NEGATIVE mg/dL
Leukocytes, UA: NEGATIVE
Nitrite: NEGATIVE
Protein, ur: NEGATIVE mg/dL
Specific Gravity, Urine: 1.01 (ref 1.005–1.030)
Urobilinogen, UA: 0.2 mg/dL (ref 0.0–1.0)
pH: 6.5 (ref 5.0–8.0)

## 2013-10-25 NOTE — Patient Instructions (Signed)
Office will call you to arrange for the vaginal estrogen prescription. Continue using it 3 times a week and followup with me in one month to 2 months.

## 2013-10-25 NOTE — Progress Notes (Signed)
Alison Cuevas [birth year] [id number]        68 y.o.  G4P0013 presents in followup with history of vaginal prolapse started on estrogen cream vaginally last month. Notes some vaginal irritation and urinary frequency. Does feel better though since starting the cream.  Past medical history,surgical history, problem list, medications, allergies, family history and social history were all reviewed and documented in the EPIC chart.  Directed ROS with pertinent positives and negatives documented in the history of present illness/assessment and plan.  Exam: [name] assistant General appearance  Normal External BUS vagina with atrophic changes. Enterocele/cystocele/rectocele noted. Bimanual without masses or tenderness. No evidence of infection/discharge.  Assessment/Plan:  68 y.o. [id number] with enterocele cystocele and rectocele. Feeling better on the vaginal estrogen cream. Still having some irritation. Urinalysis is negative. Will continue with the vaginal estrogen cream and represent in one to 2 months for reexamination. At some point we will then try to replace her pessary to see how she does with this.   Note: This document was prepared with digital dictation and possible smart phrase technology. Any transcriptional errors that result from this process are unintentional.   [name] MD, 10:01 AM 10/25/2013

## 2013-10-28 ENCOUNTER — Telehealth: Payer: Self-pay | Admitting: *Deleted

## 2013-10-28 NOTE — Telephone Encounter (Signed)
Message copied by Thamas Jaegers on Mon Oct 28, 2013  2:38 PM ------      Message from: [name]      Created: Fri Oct 25, 2013 10:04 AM       Check with patient's pharmacy to see if they can formulate vaginal estradiol cream dispense one tube apply 3 times weekly. Refill x2. If they cannot then checked with [facility] and  [facility] and let the patient know regardless where the prescription is. ------

## 2013-10-28 NOTE — Telephone Encounter (Signed)
Left message for pt to call regarding.

## 2013-10-28 NOTE — Telephone Encounter (Signed)
I do want her to use it on the outside. They can still formulate that the cream as it is the same cream that goes internally also.

## 2013-10-28 NOTE — Telephone Encounter (Signed)
Spoke with patient about the below she thought you told her to use outside the vagina not vaginally? Pt asked this because she thought this may cause issues with urination? Pt has pessary.

## 2013-10-29 MED ORDER — ESTROGENS, CONJUGATED 0.625 MG/GM VA CREA: TOPICAL_CREAM | Freq: Every day | VAGINAL | Status: DC

## 2013-10-29 NOTE — Telephone Encounter (Signed)
Pt informed, she is going to check with insurance to see what the cost would be and me back first

## 2013-10-29 NOTE — Telephone Encounter (Signed)
rx sent

## 2013-10-29 NOTE — Telephone Encounter (Signed)
The formulate vaginal estradiol can't be made at her local pharmacy, pt lives in [place] and doesn't have a compound pharmacy near by. Pt said she would rather continue with premarin which can be bought at [facility]. If you are okay with this patient will needed refill, she also wanted to confirm to use this outside vaginal area as well? Please advise

## 2013-10-29 NOTE — Telephone Encounter (Signed)
Okay with Premarin. Apply externally 3 times weekly dispense one tube with 5 refills

## 2013-10-30 ENCOUNTER — Ambulatory Visit: Payer: Medicare Other | Admitting: Gynecology

## 2013-10-31 DIAGNOSIS — N8111 Cystocele, midline: Secondary | ICD-10-CM | POA: Diagnosis not present

## 2013-10-31 DIAGNOSIS — N3941 Urge incontinence: Secondary | ICD-10-CM | POA: Diagnosis not present

## 2013-10-31 DIAGNOSIS — R39198 Other difficulties with micturition: Secondary | ICD-10-CM | POA: Diagnosis not present

## 2013-11-01 ENCOUNTER — Ambulatory Visit: Payer: Medicare Other | Admitting: Family Medicine

## 2013-11-06 ENCOUNTER — Ambulatory Visit (INDEPENDENT_AMBULATORY_CARE_PROVIDER_SITE_OTHER): Payer: Medicare Other | Admitting: Family Medicine

## 2013-11-06 ENCOUNTER — Encounter: Payer: Self-pay | Admitting: Family Medicine

## 2013-11-06 VITALS — BP 114/71 | HR 58 | Temp 97.7°F | Resp 16 | Ht 61.0 in | Wt 133.0 lb

## 2013-11-06 DIAGNOSIS — Z79899 Other long term (current) drug therapy: Secondary | ICD-10-CM | POA: Diagnosis not present

## 2013-11-06 DIAGNOSIS — I10 Essential (primary) hypertension: Secondary | ICD-10-CM

## 2013-11-06 DIAGNOSIS — F411 Generalized anxiety disorder: Secondary | ICD-10-CM | POA: Diagnosis not present

## 2013-11-06 DIAGNOSIS — G8929 Other chronic pain: Secondary | ICD-10-CM

## 2013-11-06 DIAGNOSIS — M545 Low back pain, unspecified: Secondary | ICD-10-CM

## 2013-11-06 MED ORDER — HYDROCODONE-ACETAMINOPHEN 5-325 MG PO TABS: ORAL_TABLET | ORAL | Status: DC

## 2013-11-06 NOTE — Progress Notes (Signed)
Office Note 11/06/13  CC:  Chief Complaint  Patient presents with  . Establish Care  . Alopecia    around a month or so  . Fatigue  . Medication Refill    hydrocodone    HPI:  Alison Cuevas is a 68 y.o. White female who is here to establish/transfer care. Patient's most recent primary MD: Dr. Lenna Gilford Old records in EPIC/[facility] EMR were reviewed prior to or during today's visit.  Pt takes hydrocodone pain pill, 1/2-1 tab usually twice a day.   Her neurosurgeon has recommended surgery but she declines this. She has been treated with the hydrocodone by Dr. Lenna Gilford about 1-2 yrs. Last took 1/2 tab last night.  Takes xanax nightly and sometimes another in a day.  Last took it last night.  Her LBP currently is moderate, radiates into right thigh.  NO tingling or numbness.  Past Medical History  Diagnosis Date  . Allergic rhinitis     maple pollen  . Hypercholesteremia     no meds  . GERD (gastroesophageal reflux disease)   . Diverticulosis of colon   . DJD (degenerative joint disease)   . Anxiety   . Hypertension   . Osteopenia 05/2007    DEXA 2011 WNL  . Rectocele   . Pelvic prolapse   . Chronic low back pain     Dr. Trenton Gammon.  Has had back injection--bp elevated after.    Past Surgical History  Procedure Laterality Date  . Right hemicolectomy for diverticulitis with abscess  1993  . Abdominal hysterectomy      TAH  . Resection of colon      BENIGN TUMOR  . Tubal ligation    . Cholecystectomy, laparoscopic    . Cspine surgery      Dr. Wiliam Ke level ant cerv discectomy /fusion w/plating    Family History  Problem Relation Age of Onset  . Hypertension Mother   . Breast cancer Sister 11  . Cancer Brother     melanoma  . Cancer Brother     prostate  . Lupus Sister   . Other Brother     lung disease    History   Social History  . Marital Status: Married    Spouse Name: [name]    Number of Children: 3  . Years of Education: N/A   Occupational  History  . cleaning business    Social History Main Topics  . Smoking status: Never Smoker   . Smokeless tobacco: Never Used  . Alcohol Use: No  . Drug Use: No  . Sexual Activity: No   Other Topics Concern  . Not on file   Social History Narrative   Married, has 3 children (one lives near her).  Has 8 grandchildren, 2 greatgrandchildren.   Worked cleaning apartments, worked at [employer], was a Secretary/administrator.   She retired at age 65.   No tobacco.  No alcohol.  No drugs.   Exercise: clean, work in yard.        10 siblings in all--5 have passed away--1 child age 9 with whooping cough , 1 lupus, 1 melanoma,1 lung cancer, 1 pulm fibrosis    Outpatient Encounter Prescriptions as of 11/06/2013  Medication Sig  . ALPRAZolam (XANAX) 0.5 MG tablet Take 0.25-0.5 mg by mouth 2 (two) times daily as needed for anxiety or sleep.   Marland Kitchen aspirin 81 MG tablet Take 81 mg by mouth daily as needed for pain.   . B Complex-C (SUPER B  COMPLEX PO) Take 1 tablet by mouth daily.   Marland Kitchen BIOTIN PO Take 1 tablet by mouth daily.   . Calcium Carbonate-Vitamin D (CALTRATE 600+D) 600-400 MG-UNIT per tablet Take 1 tablet by mouth daily.    . Cholecalciferol (VITAMIN D) 2000 UNITS CAPS Take 1 capsule by mouth daily.    . clobetasol cream (TEMOVATE) 8.56 % Apply 1 application topically 2 (two) times daily as needed (burning).   . conjugated estrogens (PREMARIN) vaginal cream Place vaginally daily. Apply 1 gram externally 3 times weekly  . Cranberry 1000 MG CAPS Take 2 capsules by mouth daily.   . fluticasone (FLONASE) 50 MCG/ACT nasal spray PLACE 2 SPRAYS INTO THE NOSE AT BEDTIME AS NEEDED.  Marland Kitchen guaiFENesin (MUCINEX) 600 MG 12 hr tablet Take 1,200 mg by mouth 2 (two) times daily as needed for cough or to loosen phlegm.   Marland Kitchen HYDROcodone-acetaminophen (NORCO/VICODIN) 5-325 MG per tablet Take 1/2 to 1 tablet by mouth three times daily as needed for pain.  NOT TO EXCEED THREE PER DAY.  Marland Kitchen lisinopril (PRINIVIL,ZESTRIL) 10 MG  tablet TAKE 1 TABLET BY MOUTH ONCE A DAY  . meloxicam (MOBIC) 15 MG tablet Take 1 tablet (15 mg total) by mouth daily as needed for pain.  . Multiple Vitamins-Minerals (WOMENS MULTIVITAMIN PLUS PO) Take 1 tablet by mouth daily.    . niacin 250 MG tablet Take 250 mg by mouth daily with breakfast.  . nystatin-triamcinolone (MYCOLOG II) cream Apply 1 application topically 4 (four) times daily as needed.   . polyethylene glycol powder (GLYCOLAX/MIRALAX) powder Take 17 g by mouth daily as needed for mild constipation.   . verapamil (CALAN-SR) 180 MG CR tablet Take 1 tablet (180 mg total) by mouth at bedtime.  . vitamin B-12 (CYANOCOBALAMIN) 1000 MCG tablet Take 1,000 mcg by mouth daily.    Marland Kitchen VITAMIN E PO Take 1 tablet by mouth daily.   . [DISCONTINUED] HYDROcodone-acetaminophen (NORCO/VICODIN) 5-325 MG per tablet Take 1/2 to 1 tablet by mouth three times daily as needed for pain.  NOT TO EXCEED THREE PER DAY.  . Estrogens, Conjugated (PREMARIN VA) Place vaginally.    Allergies  Allergen Reactions  . Prednisone     REACTION: funny feeling    ROS Review of Systems  Constitutional: Negative for fever and fatigue.  HENT: Negative for congestion and sore throat.   Eyes: Negative for visual disturbance.  Respiratory: Negative for cough.   Cardiovascular: Negative for chest pain.  Gastrointestinal: Negative for nausea and abdominal pain.  Genitourinary: Negative for dysuria.  Musculoskeletal: Positive for back pain. Negative for joint swelling.  Skin: Negative for rash.  Neurological: Negative for weakness and headaches.  Hematological: Negative for adenopathy.  Psychiatric/Behavioral: Negative for dysphoric mood.    PE; Blood pressure 114/71, pulse 58, temperature 97.7 F (36.5 C), temperature source Temporal, resp. rate 16, height 5\' 1"  (1.549 m), weight 133 lb (60.328 kg), last menstrual period 06/27/1972, SpO2 98.00%. Gen: Alert, well appearing.  Patient is oriented to person, place,  time, and situation. DJS:HFWY: no injection, icteris, swelling, or exudate.  EOMI, PERRLA. Mouth: lips without lesion/swelling.  Oral mucosa pink and moist. Oropharynx without erythema, exudate, or swelling.  Neck - No masses or thyromegaly or limitation in range of motion CV: RRR, no m/r/g.   LUNGS: CTA bilat, nonlabored resps, good aeration in all lung fields. ABD: soft, NT/ND EXT: no clubbing, cyanosis, or edema.  LOW BACK: diffuse tenderness throughout entire lumbar spine region, primarily in paraspinous soft tissues, no  step-offs.  Pertinent labs:  Lab Results  Component Value Date   WBC 6.5 07/25/2013   HGB 15.1* 07/25/2013   HCT 41.9 07/25/2013   MCV 87.1 07/25/2013   PLT 198 07/25/2013     Chemistry      Component Value Date/Time   NA 138 07/25/2013 1547   K 4.2 07/25/2013 1547   CL 100 07/25/2013 1547   CO2 23 07/25/2013 1547   BUN 17 07/25/2013 1547   CREATININE 0.95 07/25/2013 1547      Component Value Date/Time   CALCIUM 9.7 07/25/2013 1547   ALKPHOS 70 07/25/2013 1547   AST 33 07/25/2013 1547   ALT 28 07/25/2013 1547   BILITOT 0.7 07/25/2013 1547     Lab Results  Component Value Date   TSH 1.12 06/12/2013   Lab Results  Component Value Date   CHOL 187 12/12/2012   HDL 48.50 12/12/2012   LDLCALC 123* 12/12/2012   LDLDIRECT 136.9 09/20/2007   TRIG 80.0 12/12/2012   CHOLHDL 4 12/12/2012   IMAGING: 12/20/12 MRI L spine w/out contrast:  Impression: Severe multilevel lumbar spondylosis, facet degeneration and  degenerative spondylolisthesis. Moderate levoconvex lumbar  scoliosis. Multilevel foraminal stenosis detailed above. Central  stenosis is most pronounced at L3-L4 and appears moderate.  ASSESSMENT AND PLAN:   Transfer pt:  Chronic low back pain Multilevel degenerative changes with foraminal stenosis and central stenosis. Patient has declined surgical option offered by NS per her report today. Seems to be relatively functional on current norco dosing and I have  agreed to go ahead and continue this for now. Controlled substance contract reviewed with patient today.  Patient signed this and it will be placed in the chart.   Urine toxicology screening done today-results pending. Rx for norco 5/325, 1/2-1 tab po tid prn, #90, no RF.    Hypertension The current medical regimen is effective;  continue present plan and medications. Lytes/cr ok 06/2013.  ANXIETY Continue xanax 0.5mg , 1/2-1 bid prn. No new rx given today.  An After Visit Summary was printed and given to the patient.  Return in about 4 weeks (around 12/04/2013) for 30 min f/u (fatigue and hair loss).

## 2013-11-06 NOTE — Progress Notes (Signed)
Pre visit review using our clinic review tool, if applicable. No additional management support is needed unless otherwise documented below in the visit note. 

## 2013-11-14 NOTE — Assessment & Plan Note (Addendum)
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 Patient has declined surgical option offered by NS per her report today. Seems to be relatively functional on current norco dosing and I have agreed to go ahead and continue this for now. Controlled substance contract reviewed with patient today.  Patient signed this and it will be placed in the chart.   Urine toxicology screening done today-results pending. Rx for norco 5/325, 1/2-1 tab po tid prn, #90, no RF.

## 2013-11-14 NOTE — Assessment & Plan Note (Addendum)
Continue xanax 0.5mg , 1/2-1 bid prn. No new rx given today.

## 2013-11-14 NOTE — Assessment & Plan Note (Signed)
The current medical regimen is effective;  continue present plan and medications. Lytes/cr ok 06/2013.

## 2013-11-19 ENCOUNTER — Ambulatory Visit: Payer: Medicare Other | Admitting: Gynecology

## 2013-11-21 ENCOUNTER — Encounter: Payer: Self-pay | Admitting: Gynecology

## 2013-11-21 ENCOUNTER — Ambulatory Visit (INDEPENDENT_AMBULATORY_CARE_PROVIDER_SITE_OTHER): Payer: Medicare Other | Admitting: Gynecology

## 2013-11-21 DIAGNOSIS — L659 Nonscarring hair loss, unspecified: Secondary | ICD-10-CM | POA: Diagnosis not present

## 2013-11-21 DIAGNOSIS — L293 Anogenital pruritus, unspecified: Secondary | ICD-10-CM

## 2013-11-21 DIAGNOSIS — L292 Pruritus vulvae: Secondary | ICD-10-CM

## 2013-11-21 DIAGNOSIS — N952 Postmenopausal atrophic vaginitis: Secondary | ICD-10-CM | POA: Diagnosis not present

## 2013-11-21 DIAGNOSIS — N898 Other specified noninflammatory disorders of vagina: Secondary | ICD-10-CM | POA: Diagnosis not present

## 2013-11-21 DIAGNOSIS — R82998 Other abnormal findings in urine: Secondary | ICD-10-CM | POA: Diagnosis not present

## 2013-11-21 DIAGNOSIS — N8111 Cystocele, midline: Secondary | ICD-10-CM

## 2013-11-21 LAB — CBC WITH DIFFERENTIAL/PLATELET
Basophils Absolute: 0 10*3/uL (ref 0.0–0.1)
Basophils Relative: 0 % (ref 0–1)
Eosinophils Absolute: 0.2 10*3/uL (ref 0.0–0.7)
Eosinophils Relative: 3 % (ref 0–5)
HCT: 35.9 % — ABNORMAL LOW (ref 36.0–46.0)
Hemoglobin: 12.6 g/dL (ref 12.0–15.0)
Lymphocytes Relative: 43 % (ref 12–46)
Lymphs Abs: 2.2 10*3/uL (ref 0.7–4.0)
MCH: 30.6 pg (ref 26.0–34.0)
MCHC: 35.1 g/dL (ref 30.0–36.0)
MCV: 87.1 fL (ref 78.0–100.0)
Monocytes Absolute: 0.4 10*3/uL (ref 0.1–1.0)
Monocytes Relative: 8 % (ref 3–12)
Neutro Abs: 2.3 10*3/uL (ref 1.7–7.7)
Neutrophils Relative %: 46 % (ref 43–77)
Platelets: 175 10*3/uL (ref 150–400)
RBC: 4.12 MIL/uL (ref 3.87–5.11)
RDW: 13.6 % (ref 11.5–15.5)
WBC: 5.1 10*3/uL (ref 4.0–10.5)

## 2013-11-21 LAB — URINALYSIS W MICROSCOPIC + REFLEX CULTURE
Bilirubin Urine: NEGATIVE
Casts: NONE SEEN
Crystals: NONE SEEN
Glucose, UA: NEGATIVE mg/dL
Hgb urine dipstick: NEGATIVE
Ketones, ur: NEGATIVE mg/dL
Nitrite: NEGATIVE
Protein, ur: NEGATIVE mg/dL
RBC / HPF: NONE SEEN RBC/hpf (ref <3)
Specific Gravity, Urine: 1.015 (ref 1.005–1.030)
Urobilinogen, UA: 0.2 mg/dL (ref 0.0–1.0)
pH: 5.5 (ref 5.0–8.0)

## 2013-11-21 LAB — IRON AND TIBC
%SAT: 34 % (ref 20–55)
Iron: 102 ug/dL (ref 42–145)
TIBC: 303 ug/dL (ref 250–470)
UIBC: 201 ug/dL (ref 125–400)

## 2013-11-21 LAB — WET PREP FOR TRICH, YEAST, CLUE: Trich, Wet Prep: NONE SEEN

## 2013-11-21 MED ORDER — FLUCONAZOLE 150 MG PO TABS: 150.0000 mg | ORAL_TABLET | Freq: Once | ORAL | Status: DC

## 2013-11-21 NOTE — Progress Notes (Signed)
Alison Cuevas 19-Jun-1946 [id number]        68 y.o.  G4P0013 presents with several issues  1. Vaginal irritation and itching over the last several weeks. No odor. 2. Saw Dr. Matilde Sprang and is scheduled for urodynamics and possible surgery. 3. Generalized alopecia with hair thinning over the last year. No skin changes or weight changes. No rashes or other constitutional symptoms.  Past medical history,surgical history, problem list, medications, allergies, family history and social history were all reviewed and documented in the EPIC chart.  Directed ROS with pertinent positives and negatives documented in the history of present illness/assessment and plan.  Exam: Alison Cuevas assistant General appearance  Normal Head with general hair thinning. No spotty alopecia. No rashes or inflammatory changes in the skin. Abdomen soft without masses guarding rebound Pelvic external BUS vagina with atrophic changes. Generalized erythematous vulvitis. Cakey white discharge. Large cystocele. Bimanual without masses or tenderness.  Assessment/Plan:  68 y.o. [id number] with:  1. Vaginal irritation and itching. Wet prep shows yeast. Will treat with Diflucan 150 mg daily x4 days given the intensity of irritation. Followup if symptoms persist, worsen or recur. 2. Large cystocele with vault prolapse. Actively being evaluated by Dr Matilde Sprang. She'll continue to followup with him. Check urinalysis today. 3. Generalized hair thinning. Check thyroid panel CBC and iron study. Recommend followup with dermatologist for evaluation and treatment.   Note: This document was prepared with digital dictation and possible smart phrase technology. Any transcriptional errors that result from this process are unintentional.   Alison Auerbach MD, 11:47 AM 11/21/2013

## 2013-11-21 NOTE — Patient Instructions (Addendum)
Take Diflucan pill daily for 4 days. Followup with Dr Matilde Sprang in reference to your bladder Office will call you with lab results as far as the hair falling out. Followup with a dermatologist in reference to this.

## 2013-11-22 LAB — THYROID PANEL
Free T4: 1.11 ng/dL (ref 0.80–1.80)
T3 Uptake: 42.4 % — ABNORMAL HIGH (ref 22.5–37.0)
T4, Total: 8.1 ug/dL (ref 5.0–12.5)
TSH: 1.307 u[IU]/mL (ref 0.350–4.500)

## 2013-11-23 LAB — URINE CULTURE: Colony Count: 2000

## 2013-11-26 ENCOUNTER — Ambulatory Visit: Payer: Medicare Other | Admitting: Family Medicine

## 2013-12-03 ENCOUNTER — Telehealth: Payer: Self-pay | Admitting: *Deleted

## 2013-12-03 NOTE — Telephone Encounter (Signed)
Pt called to fax lab result from 11/21/13 OV to her PCP. PCP is with [facility] in epic.

## 2013-12-04 DIAGNOSIS — N3941 Urge incontinence: Secondary | ICD-10-CM | POA: Diagnosis not present

## 2013-12-04 DIAGNOSIS — R39198 Other difficulties with micturition: Secondary | ICD-10-CM | POA: Diagnosis not present

## 2013-12-09 ENCOUNTER — Telehealth: Payer: Self-pay | Admitting: Nurse Practitioner

## 2013-12-09 ENCOUNTER — Encounter: Payer: Self-pay | Admitting: Nurse Practitioner

## 2013-12-09 ENCOUNTER — Ambulatory Visit (INDEPENDENT_AMBULATORY_CARE_PROVIDER_SITE_OTHER): Payer: Medicare Other | Admitting: Nurse Practitioner

## 2013-12-09 ENCOUNTER — Ambulatory Visit: Payer: Medicare Other | Admitting: Gynecology

## 2013-12-09 ENCOUNTER — Emergency Department (HOSPITAL_COMMUNITY)
Admission: EM | Admit: 2013-12-09 | Discharge: 2013-12-10 | Disposition: A | Discharge status: Home / Self Care | Payer: Medicare Other | Attending: Emergency Medicine | Admitting: Emergency Medicine

## 2013-12-09 ENCOUNTER — Encounter (HOSPITAL_COMMUNITY): Payer: Self-pay | Admitting: Emergency Medicine

## 2013-12-09 ENCOUNTER — Emergency Department (HOSPITAL_COMMUNITY): Payer: Medicare Other

## 2013-12-09 VITALS — BP 119/74 | HR 79 | Temp 98.9°F | Ht 61.0 in | Wt 134.0 lb

## 2013-12-09 DIAGNOSIS — E7800 Pure hypercholesterolemia, unspecified: Secondary | ICD-10-CM | POA: Insufficient documentation

## 2013-12-09 DIAGNOSIS — G8929 Other chronic pain: Secondary | ICD-10-CM | POA: Diagnosis not present

## 2013-12-09 DIAGNOSIS — Y939 Activity, unspecified: Secondary | ICD-10-CM | POA: Insufficient documentation

## 2013-12-09 DIAGNOSIS — Z79899 Other long term (current) drug therapy: Secondary | ICD-10-CM | POA: Insufficient documentation

## 2013-12-09 DIAGNOSIS — W57XXXA Bitten or stung by nonvenomous insect and other nonvenomous arthropods, initial encounter: Secondary | ICD-10-CM | POA: Insufficient documentation

## 2013-12-09 DIAGNOSIS — R7989 Other specified abnormal findings of blood chemistry: Secondary | ICD-10-CM

## 2013-12-09 DIAGNOSIS — IMO0002 Reserved for concepts with insufficient information to code with codable children: Secondary | ICD-10-CM | POA: Diagnosis not present

## 2013-12-09 DIAGNOSIS — Z8709 Personal history of other diseases of the respiratory system: Secondary | ICD-10-CM | POA: Diagnosis not present

## 2013-12-09 DIAGNOSIS — K219 Gastro-esophageal reflux disease without esophagitis: Secondary | ICD-10-CM | POA: Insufficient documentation

## 2013-12-09 DIAGNOSIS — F411 Generalized anxiety disorder: Secondary | ICD-10-CM | POA: Insufficient documentation

## 2013-12-09 DIAGNOSIS — Z87448 Personal history of other diseases of urinary system: Secondary | ICD-10-CM | POA: Diagnosis not present

## 2013-12-09 DIAGNOSIS — R110 Nausea: Secondary | ICD-10-CM

## 2013-12-09 DIAGNOSIS — E871 Hypo-osmolality and hyponatremia: Secondary | ICD-10-CM | POA: Diagnosis not present

## 2013-12-09 DIAGNOSIS — Z7982 Long term (current) use of aspirin: Secondary | ICD-10-CM | POA: Insufficient documentation

## 2013-12-09 DIAGNOSIS — I10 Essential (primary) hypertension: Secondary | ICD-10-CM | POA: Diagnosis not present

## 2013-12-09 DIAGNOSIS — Z791 Long term (current) use of non-steroidal anti-inflammatories (NSAID): Secondary | ICD-10-CM | POA: Diagnosis not present

## 2013-12-09 DIAGNOSIS — R509 Fever, unspecified: Secondary | ICD-10-CM

## 2013-12-09 DIAGNOSIS — Y929 Unspecified place or not applicable: Secondary | ICD-10-CM | POA: Insufficient documentation

## 2013-12-09 DIAGNOSIS — T148 Other injury of unspecified body region: Secondary | ICD-10-CM | POA: Diagnosis not present

## 2013-12-09 DIAGNOSIS — D696 Thrombocytopenia, unspecified: Secondary | ICD-10-CM | POA: Diagnosis not present

## 2013-12-09 DIAGNOSIS — R945 Abnormal results of liver function studies: Secondary | ICD-10-CM

## 2013-12-09 LAB — URINALYSIS, ROUTINE W REFLEX MICROSCOPIC
Bilirubin Urine: NEGATIVE
Bilirubin Urine: NEGATIVE
Glucose, UA: NEGATIVE mg/dL
Hgb urine dipstick: NEGATIVE
Hgb urine dipstick: NEGATIVE
Ketones, ur: 15 — AB
Ketones, ur: 15 mg/dL — AB
Leukocytes, UA: NEGATIVE
Leukocytes, UA: NEGATIVE
Nitrite: NEGATIVE
Nitrite: NEGATIVE
Protein, ur: NEGATIVE mg/dL
Specific Gravity, Urine: 1.015 (ref 1.000–1.030)
Specific Gravity, Urine: 1.025 (ref 1.005–1.030)
Total Protein, Urine: NEGATIVE
Urine Glucose: NEGATIVE
Urobilinogen, UA: 0.2 (ref 0.0–1.0)
Urobilinogen, UA: 0.2 mg/dL (ref 0.0–1.0)
pH: 7.5 (ref 5.0–8.0)
pH: 6 (ref 5.0–8.0)

## 2013-12-09 LAB — CBC WITH DIFFERENTIAL/PLATELET
Basophils Absolute: 0 10*3/uL (ref 0.0–0.1)
Basophils Absolute: 0 10*3/uL (ref 0.0–0.1)
Basophils Relative: 0.3 % (ref 0.0–3.0)
Basophils Relative: 1 % (ref 0–1)
Eosinophils Absolute: 0 10*3/uL (ref 0.0–0.7)
Eosinophils Absolute: 0 10*3/uL (ref 0.0–0.7)
Eosinophils Relative: 0 % (ref 0.0–5.0)
Eosinophils Relative: 0 % (ref 0–5)
HCT: 39.3 % (ref 36.0–46.0)
HCT: 34 % — ABNORMAL LOW (ref 36.0–46.0)
Hemoglobin: 13.6 g/dL (ref 12.0–15.0)
Hemoglobin: 12.3 g/dL (ref 12.0–15.0)
Lymphocytes Relative: 31.2 % (ref 12.0–46.0)
Lymphocytes Relative: 29 % (ref 12–46)
Lymphs Abs: 0.9 10*3/uL (ref 0.7–4.0)
Lymphs Abs: 0.7 10*3/uL (ref 0.7–4.0)
MCH: 30.7 pg (ref 26.0–34.0)
MCHC: 34.6 g/dL (ref 30.0–36.0)
MCHC: 36.2 g/dL — ABNORMAL HIGH (ref 30.0–36.0)
MCV: 88.6 fl (ref 78.0–100.0)
MCV: 84.8 fL (ref 78.0–100.0)
Monocytes Absolute: 0.2 10*3/uL (ref 0.1–1.0)
Monocytes Absolute: 0.3 10*3/uL (ref 0.1–1.0)
Monocytes Relative: 6.8 % (ref 3.0–12.0)
Monocytes Relative: 11 % (ref 3–12)
Neutro Abs: 1.8 10*3/uL (ref 1.4–7.7)
Neutro Abs: 1.5 10*3/uL — ABNORMAL LOW (ref 1.7–7.7)
Neutrophils Relative %: 61.7 % (ref 43.0–77.0)
Neutrophils Relative %: 59 % (ref 43–77)
Platelets: 135 10*3/uL — ABNORMAL LOW (ref 150.0–400.0)
Platelets: 123 10*3/uL — ABNORMAL LOW (ref 150–400)
RBC: 4.43 Mil/uL (ref 3.87–5.11)
RBC: 4.01 MIL/uL (ref 3.87–5.11)
RDW: 13.4 % (ref 11.5–15.5)
RDW: 12.8 % (ref 11.5–15.5)
WBC: 3 10*3/uL — ABNORMAL LOW (ref 4.0–10.5)
WBC: 2.6 10*3/uL — ABNORMAL LOW (ref 4.0–10.5)

## 2013-12-09 LAB — COMPREHENSIVE METABOLIC PANEL
ALT: 47 U/L — ABNORMAL HIGH (ref 0–35)
ALT: 41 U/L — ABNORMAL HIGH (ref 0–35)
AST: 44 U/L — ABNORMAL HIGH (ref 0–37)
AST: 40 U/L — ABNORMAL HIGH (ref 0–37)
Albumin: 4.1 g/dL (ref 3.5–5.2)
Albumin: 3.5 g/dL (ref 3.5–5.2)
Alkaline Phosphatase: 64 U/L (ref 39–117)
Alkaline Phosphatase: 62 U/L (ref 39–117)
BUN: 13 mg/dL (ref 6–23)
BUN: 15 mg/dL (ref 6–23)
CO2: 26 mEq/L (ref 19–32)
CO2: 23 mEq/L (ref 19–32)
Calcium: 9.3 mg/dL (ref 8.4–10.5)
Calcium: 8.6 mg/dL (ref 8.4–10.5)
Chloride: 94 mEq/L — ABNORMAL LOW (ref 96–112)
Chloride: 97 mEq/L (ref 96–112)
Creatinine, Ser: 0.8 mg/dL (ref 0.4–1.2)
Creatinine, Ser: 0.78 mg/dL (ref 0.50–1.10)
GFR calc Af Amer: >90 mL/min (ref >90)
GFR calc non Af Amer: 83 mL/min — ABNORMAL LOW (ref >90)
GFR: 76.65 mL/min (ref >60.00)
Glucose, Bld: 100 mg/dL — ABNORMAL HIGH (ref 70–99)
Glucose, Bld: 123 mg/dL — ABNORMAL HIGH (ref 70–99)
Potassium: 4.1 mEq/L (ref 3.5–5.1)
Potassium: 3.6 mEq/L — ABNORMAL LOW (ref 3.7–5.3)
Sodium: 129 mEq/L — ABNORMAL LOW (ref 135–145)
Sodium: 134 mEq/L — ABNORMAL LOW (ref 137–147)
Total Bilirubin: 0.8 mg/dL (ref 0.2–1.2)
Total Bilirubin: 0.5 mg/dL (ref 0.3–1.2)
Total Protein: 6.7 g/dL (ref 6.0–8.3)
Total Protein: 6.3 g/dL (ref 6.0–8.3)

## 2013-12-09 LAB — LIPASE, BLOOD: Lipase: 17 U/L (ref 11–59)

## 2013-12-09 MED ORDER — ONDANSETRON 8 MG PO TBDP: 8.0000 mg | ORAL_TABLET | Freq: Two times a day (BID) | ORAL | Status: DC

## 2013-12-09 MED ORDER — DOXYCYCLINE HYCLATE 100 MG PO TABS: 100.0000 mg | ORAL_TABLET | Freq: Two times a day (BID) | ORAL | Status: DC

## 2013-12-09 MED ORDER — DOXYCYCLINE HYCLATE 100 MG PO TABS
100.0000 mg | ORAL_TABLET | Freq: Once | ORAL | Status: AC
  Administered 2013-12-09: 100 mg via ORAL
  Filled 2013-12-09: qty 1

## 2013-12-09 NOTE — Telephone Encounter (Signed)
Called patient to let her know that she needs an ov. Patient scheduled appt 12/09/13 at 2:00. Patient stated that she may go to ER instead of waiting for 2:00 appt.

## 2013-12-09 NOTE — Progress Notes (Signed)
   Subjective:    Patient ID: Annebelle Bostic, female    DOB: 1945/07/13, 68 y.o.   MRN: [id number]  Fever  This is a new problem. The current episode started yesterday. The problem has been unchanged. The maximum temperature noted was 100 to 100.9 F. The temperature was taken using an oral thermometer. Associated symptoms include abdominal pain, congestion (nasal), headaches, nausea and vomiting ("dry heaves" today). Pertinent negatives include no chest pain, coughing, diarrhea, ear pain, muscle aches, rash, sore throat or urinary pain. Associated symptoms comments: Anorexia, sipping water-keeping down. Cannot remember if she has had tick bite in last 21 days..      Review of Systems  Constitutional: Positive for fever, chills (chills for several days), activity change and fatigue.       Felt bad after returning from church last night-about 8pm. Been in bed most of today. Didn't sleep well last night-due to nose bleed.  HENT: Positive for congestion (nasal), nosebleeds and postnasal drip. Negative for ear pain and sore throat.   Eyes: Negative for redness.  Respiratory: Negative for cough, chest tightness and shortness of breath.   Cardiovascular: Negative for chest pain.  Gastrointestinal: Positive for nausea, vomiting ("dry heaves" today) and abdominal pain. Negative for diarrhea.  Genitourinary: Positive for difficulty urinating (has pessary) and pelvic pain. Negative for dysuria and urgency.  Musculoskeletal: Negative for arthralgias, back pain and myalgias.  Skin: Negative for rash.  Neurological: Positive for headaches.       Objective:   Physical Exam  Constitutional: She is oriented to person, place, and time. She appears well-developed. She appears distressed.  Looks tired  HENT:  Head: Normocephalic and atraumatic.  Right Ear: External ear normal.  Left Ear: External ear normal.  Nose: Nose normal.  Mouth/Throat: Oropharynx is clear and moist. No oropharyngeal exudate.    Eyes: Conjunctivae are normal. Right eye exhibits no discharge. Left eye exhibits no discharge.  Neck: Normal range of motion. Neck supple. No thyromegaly present.  Cardiovascular: Normal rate, regular rhythm and normal heart sounds.   No murmur heard. Pulmonary/Chest: Effort normal and breath sounds normal. No respiratory distress. She has no wheezes. She has no rales.  Abdominal: Soft. She exhibits no distension and no mass. There is no tenderness. There is no rebound and no guarding.  Palpation increases nausea  Lymphadenopathy:    She has no cervical adenopathy.  Neurological: She is alert and oriented to person, place, and time.  Cannot remember if she has had tick bite.  Skin: Skin is warm and dry.  Psychiatric: She has a normal mood and affect. Her behavior is normal. Thought content normal.          Assessment & Plan:  1. Nausea alone "dry heaves" today - POCT urinalysis dipstick-ketones o/w nml. - Urine culture - Urinalysis, Routine w reflex microscopic - CBC with Differential - Comprehensive metabolic panel - ondansetron (ZOFRAN-ODT) 8 MG disintegrating tablet; Take 1 tablet (8 mg total) by mouth 2 (two) times daily.  Dispense: 20 tablet; Refill: 0  2. Fever, unspecified Reports low grade at home DD: UTI, viral illness, sinusitis  Pt has appt. To see Dr Phineas Real this afternoon-recently treated for BV. Will monitor labs-looking for infection.

## 2013-12-09 NOTE — ED Provider Notes (Signed)
CSN: [id number]     Arrival date & time 12/09/13  1940 History   First MD Initiated Contact with Patient 12/09/13 2031     Chief Complaint  Patient presents with  . Abnormal Lab      HPI Pt was seen at 2130. Per pt, c/o gradual onset and persistence of constant generalized fatigue, nausea and decreased PO food intake for the past 2 days. Has been associated with home fevers to "100" and "100.9."  Pt was evaluated by her PMD today, had labs drawn, and was told to come to the ED for further evaluation of "low sodium, low platelets and low WBC counts." Pt states her PMD rx her zofran, and she has been able to tol PO fluids today, including "2 bowls of soup," PTA.  Pt endorses hx of multiple tick bites within the past month. Denies rash, no CP/palpitations, no SOB/cough, no abd pain, no vomiting/diarrhea, no flank pain.    Past Medical History  Diagnosis Date  . Allergic rhinitis     maple pollen  . Hypercholesteremia     no meds  . GERD (gastroesophageal reflux disease)   . Diverticulosis of colon   . DJD (degenerative joint disease)   . Anxiety   . Hypertension   . Osteopenia 05/2007    DEXA 2011 WNL  . Rectocele   . Pelvic prolapse   . Chronic low back pain     Dr. Trenton Gammon.  Has had back injection--bp elevated after.   Past Surgical History  Procedure Laterality Date  . Right hemicolectomy for diverticulitis with abscess  1993  . Abdominal hysterectomy      TAH  . Resection of colon      BENIGN TUMOR  . Tubal ligation    . Cholecystectomy, laparoscopic    . Cspine surgery      Dr. Wiliam Ke level ant cerv discectomy /fusion w/plating   Family History  Problem Relation Age of Onset  . Hypertension Mother   . Breast cancer Sister 69  . Cancer Brother     melanoma  . Cancer Brother     prostate  . Lupus Sister   . Other Brother     lung disease   History  Substance Use Topics  . Smoking status: Never Smoker   . Smokeless tobacco: Never Used  . Alcohol Use: No   OB  History   Grav Para Term Preterm Abortions TAB SAB Ect Mult Living   4 3   1     3      Review of Systems ROS: Statement: All systems negative except as marked or noted in the HPI; Constitutional: Negative for chills. +fatigue.; ; Eyes: Negative for eye pain, redness and discharge. ; ; ENMT: Negative for ear pain, hoarseness, nasal congestion, sinus pressure and sore throat. ; ; Cardiovascular: Negative for chest pain, palpitations, diaphoresis, dyspnea and peripheral edema. ; ; Respiratory: Negative for cough, wheezing and stridor. ; ; Gastrointestinal: +nausea. Negative for vomiting, diarrhea, abdominal pain, blood in stool, hematemesis, jaundice and rectal bleeding. . ; ; Genitourinary: Negative for dysuria, flank pain and hematuria. ; ; Musculoskeletal: Negative for back pain and neck pain. Negative for swelling and trauma.; ; Skin: Negative for pruritus, rash, abrasions, blisters, bruising and skin lesion.; ; Neuro: Negative for headache, lightheadedness and neck stiffness. Negative for weakness, altered level of consciousness , altered mental status, extremity weakness, paresthesias, involuntary movement, seizure and syncope.      Allergies  Prednisone  Home Medications  Prior to Admission medications   Medication Sig Start Date End Date Taking? Authorizing Provider  ALPRAZolam Duanne Moron) 0.5 MG tablet Take 0.25-0.5 mg by mouth 2 (two) times daily as needed for anxiety or sleep.    Yes Historical Provider, MD  aspirin EC 81 MG tablet Take 81 mg by mouth once a week.   Yes Historical Provider, MD  B Complex-C (SUPER B COMPLEX PO) Take 1 tablet by mouth daily.    Yes Historical Provider, MD  BIOTIN PO Take 1 tablet by mouth daily.    Yes Historical Provider, MD  Calcium Carbonate-Vitamin D (CALTRATE 600+D) 600-400 MG-UNIT per tablet Take 1 tablet by mouth daily.     Yes Historical Provider, MD  Cholecalciferol (VITAMIN D) 2000 UNITS CAPS Take 1 capsule by mouth daily.     Yes Historical  Provider, MD  conjugated estrogens (PREMARIN) vaginal cream Place 1 Applicatorful vaginally 3 (three) times a week.   Yes Historical Provider, MD  Cranberry 1000 MG CAPS Take 2 capsules by mouth daily.    Yes Historical Provider, MD  fluticasone (FLONASE) 50 MCG/ACT nasal spray Place 2 sprays into both nostrils at bedtime.   Yes Historical Provider, MD  guaiFENesin (MUCINEX) 600 MG 12 hr tablet Take 1,200 mg by mouth 2 (two) times daily as needed for cough or to loosen phlegm.    Yes Historical Provider, MD  HYDROcodone-acetaminophen (NORCO/VICODIN) 5-325 MG per tablet Take 1/2 to 1 tablet by mouth three times daily as needed for pain.  NOT TO EXCEED THREE PER DAY. 11/06/13  Yes [name], MD  lisinopril (PRINIVIL,ZESTRIL) 10 MG tablet Take 5-10 mg by mouth every evening.   Yes Historical Provider, MD  meloxicam (MOBIC) 15 MG tablet Take 15 mg by mouth daily.   Yes Historical Provider, MD  Multiple Vitamins-Minerals (WOMENS MULTIVITAMIN PLUS PO) Take 1 tablet by mouth daily.     Yes Historical Provider, MD  nystatin-triamcinolone (MYCOLOG II) cream Apply 1 application topically 4 (four) times daily as needed.  06/10/11  Yes [name], MD  ondansetron (ZOFRAN-ODT) 8 MG disintegrating tablet Take 1 tablet (8 mg total) by mouth 2 (two) times daily. 12/09/13  Yes Irene Pap, NP  verapamil (CALAN-SR) 180 MG CR tablet Take 90-180 mg by mouth every evening.   Yes Historical Provider, MD  vitamin B-12 (CYANOCOBALAMIN) 1000 MCG tablet Take 1,000 mcg by mouth daily.     Yes Historical Provider, MD  VITAMIN E PO Take 1 tablet by mouth daily.    Yes Historical Provider, MD   BP 149/79  Pulse 96  Temp(Src) 98.1 F (36.7 C) (Oral)  Resp 20  Ht 5\' 1"  (1.549 m)  Wt 134 lb (60.782 kg)  BMI 25.33 kg/m2  SpO2 96%  LMP 06/27/1972 Physical Exam 2135: Physical examination:  Nursing notes reviewed; Vital signs and O2 SAT reviewed;  Constitutional: Well developed, Well nourished, Well hydrated,  In no acute distress; Head:  Normocephalic, atraumatic; Eyes: EOMI, PERRL, No scleral icterus; ENMT: Mouth and pharynx normal, Mucous membranes moist; Neck: Supple, Full range of motion, No lymphadenopathy. No meningeal signs.; Cardiovascular: Regular rate and rhythm, No gallop; Respiratory: Breath sounds clear & equal bilaterally, No rales, rhonchi, wheezes.  Speaking full sentences with ease, Normal respiratory effort/excursion; Chest: Nontender, Movement normal; Abdomen: Soft, Nontender, Nondistended, Normal bowel sounds; Genitourinary: No CVA tenderness; Extremities: Pulses normal, No tenderness, No edema, No calf edema or asymmetry.; Neuro: AA&Ox3, Major CN grossly intact.  Speech clear. No gross focal motor  or sensory deficits in extremities. Climbs on and off stretcher easily by herself. Gait steady.; Skin: Color normal, Warm, Dry.   ED Course  Procedures    MDM  MDM Reviewed: previous chart, nursing note and vitals Reviewed previous: labs Interpretation: labs and x-ray    Results for orders placed during the hospital encounter of 12/09/13  CBC WITH DIFFERENTIAL      Result Value Ref Range   WBC 2.6 (*) 4.0 - 10.5 K/uL   RBC 4.01  3.87 - 5.11 MIL/uL   Hemoglobin 12.3  12.0 - 15.0 g/dL   HCT 34.0 (*) 36.0 - 46.0 %   MCV 84.8  78.0 - 100.0 fL   MCH 30.7  26.0 - 34.0 pg   MCHC 36.2 (*) 30.0 - 36.0 g/dL   RDW 12.8  11.5 - 15.5 %   Platelets 123 (*) 150 - 400 K/uL   Neutrophils Relative % 59  43 - 77 %   Neutro Abs 1.5 (*) 1.7 - 7.7 K/uL   Lymphocytes Relative 29  12 - 46 %   Lymphs Abs 0.7  0.7 - 4.0 K/uL   Monocytes Relative 11  3 - 12 %   Monocytes Absolute 0.3  0.1 - 1.0 K/uL   Eosinophils Relative 0  0 - 5 %   Eosinophils Absolute 0.0  0.0 - 0.7 K/uL   Basophils Relative 1  0 - 1 %   Basophils Absolute 0.0  0.0 - 0.1 K/uL  COMPREHENSIVE METABOLIC PANEL      Result Value Ref Range   Sodium 134 (*) 137 - 147 mEq/L   Potassium 3.6 (*) 3.7 - 5.3 mEq/L   Chloride 97  96 -  112 mEq/L   CO2 23  19 - 32 mEq/L   Glucose, Bld 123 (*) 70 - 99 mg/dL   BUN 15  6 - 23 mg/dL   Creatinine, Ser 0.78  0.50 - 1.10 mg/dL   Calcium 8.6  8.4 - 10.5 mg/dL   Total Protein 6.3  6.0 - 8.3 g/dL   Albumin 3.5  3.5 - 5.2 g/dL   AST 40 (*) 0 - 37 U/L   ALT 41 (*) 0 - 35 U/L   Alkaline Phosphatase 62  39 - 117 U/L   Total Bilirubin 0.5  0.3 - 1.2 mg/dL   GFR calc non Af Amer 83 (*) >90 mL/min   GFR calc Af Amer >90  >90 mL/min  URINALYSIS, ROUTINE W REFLEX MICROSCOPIC      Result Value Ref Range   Color, Urine YELLOW  YELLOW   APPearance CLEAR  CLEAR   Specific Gravity, Urine 1.025  1.005 - 1.030   pH 6.0  5.0 - 8.0   Glucose, UA NEGATIVE  NEGATIVE mg/dL   Hgb urine dipstick NEGATIVE  NEGATIVE   Bilirubin Urine NEGATIVE  NEGATIVE   Ketones, ur 15 (*) NEGATIVE mg/dL   Protein, ur NEGATIVE  NEGATIVE mg/dL   Urobilinogen, UA 0.2  0.0 - 1.0 mg/dL   Nitrite NEGATIVE  NEGATIVE   Leukocytes, UA NEGATIVE  NEGATIVE  LIPASE, BLOOD      Result Value Ref Range   Lipase 17  11 - 59 U/L   Dg Abd Acute W/chest 12/09/2013   CLINICAL DATA:  Nausea and weight loss.  Hyponatremia.  EXAM: ACUTE ABDOMEN SERIES (ABDOMEN 2 VIEW & CHEST 1 VIEW)  COMPARISON:  Chest and abdominal radiographs performed 05/03/2013  FINDINGS: The lungs are well-aerated and clear. There is no evidence  of focal opacification, pleural effusion or pneumothorax. The cardiomediastinal silhouette is within normal limits.  The visualized bowel gas pattern is unremarkable. Scattered stool and air are seen within the colon; there is no evidence of small bowel dilatation to suggest obstruction. No free intra-abdominal air is identified on the provided upright view. Clips are noted within the right upper quadrant, reflecting prior cholecystectomy.  No acute osseous abnormalities are seen; the sacroiliac joints are unremarkable in appearance. Left convex lumbar scoliosis is noted, with mild associated degenerative change. Cervical  spinal fusion hardware is partially imaged.  IMPRESSION: 1. Unremarkable bowel gas pattern; no free intra-abdominal air seen. 2. No acute cardiopulmonary process seen. 3. Left convex lumbar scoliosis, with mild associated degenerative change.   Electronically Signed   By: Garald Balding M.D.   On: 12/09/2013 22:31    2315:  Pt has tol PO well while in the ED without N/V.  No stooling while in the ED.  Abd remains benign, VSS. Pt is not orthostatic. No clear indication for admission at this time. Pt states she continues to feels better and wants to go home now. Given pt's lab abnormalities, in conjunction with her recent multiple tick bites, will tx empirically for RMSF with doxycycline. Reasoning explained to pt and family; verb understanding. Dx and testing d/w pt and family.  Questions answered.  Verb understanding, agreeable to d/c home with outpt f/u.       Alfonzo Feller, DO 12/12/13 [phone]

## 2013-12-09 NOTE — Telephone Encounter (Signed)
Spoke w/hospitalist-suggested go to er for further eval. Spoke w/triage nurse at [facility] ED, notified pt on way.

## 2013-12-09 NOTE — Telephone Encounter (Signed)
Spoke w/pt. Discussed concern for low sodium, WBC, & platelets.  Asked husband about tick bite-he said she has removed at least 5 ticks in last month or so. I am concerned she may have RMSF as she has HA, fatigue, low fever, thrombocytopenia, hyponatremia. Recommend she go to ER this evening.  Pt agrees to go.

## 2013-12-09 NOTE — Discharge Instructions (Signed)
°Emergency Department Resource Guide °1) Find a Doctor and Pay Out of Pocket °Although you won't have to find out who is covered by your insurance plan, it is a good idea to ask around and get recommendations. You will then need to call the office and see if the doctor you have chosen will accept you as a new patient and what types of options they offer for patients who are self-pay. Some doctors offer discounts or will set up payment plans for their patients who do not have insurance, but you will need to ask so you aren't surprised when you get to your appointment. ° °2) Contact Your Local Health Department °Not all health departments have doctors that can see patients for sick visits, but many do, so it is worth a call to see if yours does. If you don't know where your local health department is, you can check in your phone book. The CDC also has a tool to help you locate your state's health department, and many state websites also have listings of all of their local health departments. ° °3) Find a Walk-in Clinic °If your illness is not likely to be very severe or complicated, you may want to try a walk in clinic. These are popping up all over the country in pharmacies, drugstores, and shopping centers. They're usually staffed by nurse practitioners or physician assistants that have been trained to treat common illnesses and complaints. They're usually fairly quick and inexpensive. However, if you have serious medical issues or chronic medical problems, these are probably not your best option. ° °No Primary Care Doctor: °- Call [facility] at  [phone] - they can help you locate a primary care doctor that  accepts your insurance, provides certain services, etc. °- Physician Referral Service- 1-800-533-3463 ° °Chronic Pain Problems: °Organization         Address  Phone   Notes  °[facility]  ([phone]) [phone] Patients need to be referred by their primary care doctor.  ° °Medication  Assistance: °Organization         Address  Phone   Notes  °[place] [facility] [address] °[place], [place] [address] (336) 641-8030 --Must be a resident of [place] °-- Must have NO insurance coverage whatsoever (no Medicaid/ Medicare, etc.) °-- The pt. MUST have a primary care doctor that directs their care regularly and follows them in the community °  °[facility]  (866) 331-1348   °[employer]  (888) 892-1162   ° °Agencies that provide inexpensive medical care: °Organization         Address  Phone   Notes  °[facility]  (336) 832-8035   °[facility]    (336) 832-7272   °[facility] [address] °[place], [place] [address] (336) 832-4777   °[facility] [address], °[place] (336) 271-4999   °[facility]    (336) 373-0678   °[facility]    (336) 272-1050   °[facility] ° [address], [place] Phone:  (336) 832-4444, Fax:  (336) 832-4440 Hours of Operation:  9 am - 6 pm, M-F.  Also accepts Medicaid/Medicare and self-pay.  °[facility] ° [address], [place] Phone: (336) [phone], Fax: (336) [phone]. Hours of Operation:  8:30 am - 5:30 pm, M-F.  Also accepts Medicaid and self-pay.  °[facility] [place] [address], [place] Phone: (336) 878-6027   °[facility] [address], [place], [place] (336)723-1848, Ext. 123 Mondays & Thursdays: 7-9 AM.  First 15 patients are seen on a first come, first serve basis. °  ° °Medicaid-accepting [place] Providers: ° °Organization         Address  Phone   Notes  °[facility] [address], [place] (336) 641-2100 Also accepts self-pay patients.  °[facility] [address], [place] ° (336) 856-9996   °[facility] [address], [place]  (336) 288-8857   °[facility] [address] [place] [address], [place] (336) 299-7000   °Veita Bland [address], [place]  ° (336) 373-1557 Only accepts [place] Access Medicaid patients after they have their name applied to their card.  ° °Self-Pay (no insurance) in [place]: ° °Organization         Address  Phone   Notes  °Sickle Cell Patients, [facility] [address], [place] (336) 832-1970   °[facility] [address], [place] (336) 832-4400   °[facility] ° [address], [place] (336) 992-4800   °[facility]/Dr. Osei-Bonsu ° [address] [place] [address], [place] or [address], [place] (336) [phone] Phone number for both [place] and [place] locations is the same.  °[facility] [address], [place] (336) 299-0000   °[facility] [address] [place] [address], [place] or [address] (336) 852-7530 °(336) 878-2260   °[facility] [address],  (336) 350-1642, phone; (336) 294-5005, fax Sees patients 1st and 3rd Saturday of every month.  Must not qualify for public or private insurance (i.e. Medicaid, Medicare, [place] Health Choice, Veterans' Benefits) • Household income should be no more than 200% of the poverty level •The clinic cannot treat you if you are pregnant or think you are pregnant • Sexually transmitted diseases are not treated at the clinic.  ° ° °Dental Care: °Organization         Address  Phone  Notes  °[place] [facility] [address],  (336) 641-6152 Accepts children up to age 21 who are enrolled in Medicaid or [place] Health Choice; pregnant women with a Medicaid card; and children who have applied for Medicaid or [place] Health Choice, but were declined, whose parents can pay a reduced fee at time of service.  °[place]  [facility] [place]  [address], [place] (336) 641-7733 Accepts children up to age 21 who are enrolled in Medicaid or [place] Health Choice; pregnant women with a Medicaid card; and children who have applied for Medicaid or [place] Health Choice, but were declined, whose parents can pay a reduced fee at time of service.  °[facility] ° [address],  (336) 641-4533 Patients are seen by appointment only. Walk-ins are not accepted. [facility] will see patients 18 years of age and older. °Monday - Tuesday (8am-5pm) °Most Wednesdays (8:30-5pm) °$30 per visit, cash only  °[facility] ° [address], [place] (336) 641-4533 Patients are seen by appointment only. Walk-ins are not accepted. [facility] will see patients 18 years of age and older. °One   Wednesday Evening (Monthly: Volunteer Based).  $30 per visit, cash only  °[facility]  (919) [phone] for adults; Children under age 4, call Graduate Pediatric Dentistry at ([phone]) [phone]. Children aged 4-14, please call ([phone]) [phone] to request a pediatric application. ° Dental services are provided in all areas of dental care including fillings, crowns and bridges, complete and partial dentures, implants, gum treatment, root canals, and extractions. Preventive care is also provided. Treatment is provided to both adults and children. °Patients are selected via a lottery and there is often a waiting list. °  °[facility] [address], °[place] ° (336) 763-8833 [email or web address] °  °[facility] [address], [place], [place] (336)723-1848, Ext. 123 Second and Fourth Thursday of each month, opens at 6:30 AM; Clinic ends at 9 AM.  Patients are seen on a first-come first-served basis, and a limited number are seen during each clinic.  ° °[facility] ° [address], [place], [place] (336) [phone]    Eligibility Requirements °You must have lived in [place], [place], or [place] counties for at least the last three months. °  You cannot be eligible for state or federal sponsored healthcare insurance, including Veterans Administration, Medicaid, or Medicare. °  You generally cannot be eligible for healthcare insurance through your employer.  °  How to apply: °Eligibility screenings are held every Tuesday and Wednesday afternoon from 1:00 pm until 4:00 pm. You do not need an appointment for the interview!  °[facility] [address], [place], [place] 336-631-2330   °[facility]  336-342-8273   °[place] [facility]  336-703-3100   °[facility]  336-570-6415   ° °Behavioral Health Resources in the Community: °Intensive Outpatient Programs °Organization         Address  Phone  Notes  °[facility] [address], [place], [place] 336-878-6098   °[facility] [address], [place], [place] 336-832-9800   °[facility] [address], [place], [place] ° 336-882-2125   °[facility] [address],  °[place], [place] 1-800-853-5163 or 336-641-4981   °Substance Abuse Resources °Organization         Address  Phone  Notes  °[facility]  336-882-2125   °[facility]  336-784-9470   °[facility]  336-285-9073   °[facility]  336-845-3988   °Residential & Outpatient Substance Abuse Program  1-800-659-3381   °Psychological Services °Organization         Address  Phone  Notes  °[facility]  336- 832-9600   °[facility]  336- 378-7881   °[facility] [address], [place] 1-800-853-5163 or 336-641-4981   ° °Mobile Crisis Teams °Organization         Address  Phone  Notes  °[facility], Mobile Crisis Care Unit  1-877-626-1772   °Assertive °[facility] ° [address]  [place], [place] 336-834-9664   °Sharon DeEsch [address] °[place] 336-554-5454   ° °Self-Help/Support Groups °Organization         Address  Phone             Notes  °[facility]  - variety of support groups  336- 373-1402 Call for more information  °Narcotics Anonymous (NA), [facility] [address], °[place]   2 meetings at this location  ° °  Residential Treatment Programs Organization         Address  Phone  Notes  [facility] [address],    [place]  1-218-170-4909   [facility]  [address] [place] [address], [place], [place]   [facility] [place], [place] [phone] Admissions: 8am-3pm M-F  [facility] [place] [address],    [place], [place] 093-267-1245   [facility] [address] [place], [place], [place]   [facility] [address],  [place], [place]   [facility] Intensive Outpatient [address], [place], [place]   [facility] [place]  [place], [place] 1-5734711182 or 412-428-9689   [facility] [address], [place], [place] Accepts Medicaid  [facility] [address]  [place] 1-613-211-8802 Substance Abuse/Addiction Treatment   [facility]         Address  Phone  Notes  [facility]  (862) 471-3469   Domenic Schwab, PhD [address] [place], [place]   (807) 006-8270 or (952)486-7507   [place], [place] 908-035-3882   [facility] [address], [place], [place] (517) 082-9257 Insurance/Medicaid/sponsorship through [facility] [address] [place]                                    [place], [place] 250-391-5118 [place] [address], [place] [phone]    Dr. Adele Schilder  [phone]   [facility] Dept. 1) [address] [place] 2) [place] 3)  [address], [place] ([phone])[phone]  [phone]   [place] 561-842-6008 or 715-882-2755 (After Hours)       Take the prescription as directed.  Increase your fluid intake (ie:  Gatoraide) for the next few days, as discussed.  Eat a bland diet and advance to your regular diet slowly as you can tolerate it. Your sodium level was improved on your labs tonight.  Call your regular medical doctor tomorrow morning to schedule a follow up appointment within the next 24 to 48 hours to re-check your lab work (liver function tests, platelets).  Return to the Emergency Department immediately if not improving (or even worsening) despite taking the medicines as prescribed, any black or bloody stool or vomit, if you develop a fever over "101," or for any other concerns.

## 2013-12-09 NOTE — Patient Instructions (Addendum)
Start sinus rinse (Neilmed Sinus Rinse) once daily for 1 week.  Take ondansetron for nausea. Urine looks OK, but I will send for culture. We will call with lab results. I will start antibiotic if white cells are elevated.

## 2013-12-09 NOTE — Progress Notes (Signed)
Pre visit review using our clinic review tool, if applicable. No additional management support is needed unless otherwise documented below in the visit note. 

## 2013-12-09 NOTE — ED Notes (Signed)
Patient told by PMD at [facility] to come to ER for abnormal labs; states WBC down, platelets down and weight is down.

## 2013-12-09 NOTE — Telephone Encounter (Signed)
White cells & platelets low. Sodium low. HGB nml. AST & ALT elevated.  Ketones in urine. Calcium nml. Unable to reach pt by phone. Recmd she go to ER for IV hydration & electrolyte replacement. LM w/my personal cell #.

## 2013-12-09 NOTE — Telephone Encounter (Signed)
Needs OV.  

## 2013-12-09 NOTE — Telephone Encounter (Signed)
Alison Cuevas has been up all night with fever, chills, bloody nose. Is very nauseous this morning. Does not feel like she can travel well.  Feels like she has a sinus infection. She states Dr. Lenna Gilford would send in a Rx for a Zpack that has worked very well in the past. Alison Cuevas okay with making fu appt with Dr. Anitra Lauth or with Janann August if needed.

## 2013-12-09 NOTE — ED Notes (Signed)
Pt alert and oriented.  No distress noted.  Reports being sent by provider.

## 2013-12-09 NOTE — Telephone Encounter (Signed)
Please advise 

## 2013-12-10 ENCOUNTER — Ambulatory Visit: Payer: Medicare Other | Admitting: Gynecology

## 2013-12-10 ENCOUNTER — Ambulatory Visit: Payer: Medicare Other | Admitting: Nurse Practitioner

## 2013-12-10 LAB — POCT URINALYSIS DIPSTICK
Bilirubin, UA: NEGATIVE
Blood, UA: NEGATIVE
Glucose, UA: NEGATIVE
Ketones, UA: 15
Leukocytes, UA: NEGATIVE
Nitrite, UA: NEGATIVE
Protein, UA: NEGATIVE
Spec Grav, UA: 1.02
Urobilinogen, UA: 0.2
pH, UA: 7.5

## 2013-12-11 ENCOUNTER — Ambulatory Visit: Payer: Medicare Other | Admitting: Family Medicine

## 2013-12-11 ENCOUNTER — Ambulatory Visit: Payer: Medicare Other | Admitting: Pulmonary Disease

## 2013-12-11 ENCOUNTER — Telehealth: Payer: Self-pay | Admitting: *Deleted

## 2013-12-11 NOTE — Telephone Encounter (Signed)
Patient left vm wanting info about her f/u appt. Returned pt's call and scheduled pt f/u appt with Dr. Anitra Lauth for 12/25/2013 at 11:30 am.

## 2013-12-12 DIAGNOSIS — N8111 Cystocele, midline: Secondary | ICD-10-CM | POA: Diagnosis not present

## 2013-12-12 DIAGNOSIS — N816 Rectocele: Secondary | ICD-10-CM | POA: Diagnosis not present

## 2013-12-13 ENCOUNTER — Telehealth: Payer: Self-pay | Admitting: *Deleted

## 2013-12-13 LAB — URINE CULTURE: Colony Count: 35000

## 2013-12-13 NOTE — Telephone Encounter (Signed)
Left message for pt to return call.

## 2013-12-13 NOTE — Telephone Encounter (Signed)
Patient left vm stating that she did not receive fluids while at the hospital Monday. Patient stated that she has been drinking a lot of water, but still does not feel well. Patient wanted to know of you had received any results from ED?

## 2013-12-13 NOTE — Telephone Encounter (Signed)
I reviewed hospital encounter. Please move her appt. W. Dr Anitra Lauth to early week if possible. I am concerned about platelets.  Advise pt of new appt. Time & if she has bleeding from gums or nose, or bruises that she cannot explain, or small red dots on legs, arms, abdomen, she should go to ER over weekend.

## 2013-12-16 ENCOUNTER — Ambulatory Visit (INDEPENDENT_AMBULATORY_CARE_PROVIDER_SITE_OTHER): Payer: Medicare Other | Admitting: Family Medicine

## 2013-12-16 ENCOUNTER — Encounter: Payer: Self-pay | Admitting: Family Medicine

## 2013-12-16 VITALS — BP 115/78 | HR 64 | Temp 97.7°F | Resp 16 | Ht 61.0 in | Wt 133.0 lb

## 2013-12-16 DIAGNOSIS — R7402 Elevation of levels of lactic acid dehydrogenase (LDH): Secondary | ICD-10-CM

## 2013-12-16 DIAGNOSIS — E871 Hypo-osmolality and hyponatremia: Secondary | ICD-10-CM

## 2013-12-16 DIAGNOSIS — D72819 Decreased white blood cell count, unspecified: Secondary | ICD-10-CM

## 2013-12-16 DIAGNOSIS — R7401 Elevation of levels of liver transaminase levels: Secondary | ICD-10-CM

## 2013-12-16 DIAGNOSIS — A94 Unspecified arthropod-borne viral fever: Secondary | ICD-10-CM | POA: Diagnosis not present

## 2013-12-16 DIAGNOSIS — B882 Other arthropod infestations: Secondary | ICD-10-CM

## 2013-12-16 DIAGNOSIS — D696 Thrombocytopenia, unspecified: Secondary | ICD-10-CM

## 2013-12-16 DIAGNOSIS — R740 Nonspecific elevation of levels of transaminase and lactic acid dehydrogenase [LDH]: Secondary | ICD-10-CM

## 2013-12-16 LAB — CBC WITH DIFFERENTIAL/PLATELET
Basophils Absolute: 0 10*3/uL (ref 0.0–0.1)
Basophils Relative: 0.5 % (ref 0.0–3.0)
Eosinophils Absolute: 0.1 10*3/uL (ref 0.0–0.7)
Eosinophils Relative: 1.2 % (ref 0.0–5.0)
HCT: 37.7 % (ref 36.0–46.0)
Hemoglobin: 12.5 g/dL (ref 12.0–15.0)
Lymphocytes Relative: 56 % — ABNORMAL HIGH (ref 12.0–46.0)
Lymphs Abs: 3.2 10*3/uL (ref 0.7–4.0)
MCHC: 33.2 g/dL (ref 30.0–36.0)
MCV: 91.1 fl (ref 78.0–100.0)
Monocytes Absolute: 0.3 10*3/uL (ref 0.1–1.0)
Monocytes Relative: 5.7 % (ref 3.0–12.0)
Neutro Abs: 2.1 10*3/uL (ref 1.4–7.7)
Neutrophils Relative %: 36.6 % — ABNORMAL LOW (ref 43.0–77.0)
Platelets: 250 10*3/uL (ref 150.0–400.0)
RBC: 4.14 Mil/uL (ref 3.87–5.11)
RDW: 13.3 % (ref 11.5–15.5)
WBC: 5.8 10*3/uL (ref 4.0–10.5)

## 2013-12-16 LAB — COMPREHENSIVE METABOLIC PANEL
ALT: 31 U/L (ref 0–35)
AST: 28 U/L (ref 0–37)
Albumin: 4.1 g/dL (ref 3.5–5.2)
Alkaline Phosphatase: 56 U/L (ref 39–117)
BUN: 12 mg/dL (ref 6–23)
CO2: 27 mEq/L (ref 19–32)
Calcium: 9.4 mg/dL (ref 8.4–10.5)
Chloride: 98 mEq/L (ref 96–112)
Creatinine, Ser: 0.7 mg/dL (ref 0.4–1.2)
GFR: 85.31 mL/min (ref >60.00)
Glucose, Bld: 101 mg/dL — ABNORMAL HIGH (ref 70–99)
Potassium: 4.6 mEq/L (ref 3.5–5.1)
Sodium: 132 mEq/L — ABNORMAL LOW (ref 135–145)
Total Bilirubin: 1 mg/dL (ref 0.2–1.2)
Total Protein: 6.4 g/dL (ref 6.0–8.3)

## 2013-12-16 NOTE — Progress Notes (Signed)
OFFICE NOTE  12/16/2013  CC:  Chief Complaint  Patient presents with  . Hospitalization Follow-up   HPI: Patient is a 68 y.o. Caucasian female who is here for 1 wk f/u ED visit (entire ED records reviewed today) for malaise, headache, nausea assoc with mildly low Na, low WBC, low platelets, and mildly elevated AST/ALT.  Doxy was started for potential tick born illness and she has taken this as prescribed but it is causing a bit of nausea.  No fevers. Improved energy lately.  No more headache, no body aches.  No diarrhea.  Recent constipation-but this cleared up with increased fluids, walking, and miralax.  No blood noted in urine or stool.  No skin rash or bruising.  No abd pain.  No cough/SOB/CP.  Pertinent PMH:  Past medical, surgical, social, and family history reviewed and no changes are noted since last office visit.  MEDS:  Outpatient Prescriptions Prior to Visit  Medication Sig Dispense Refill  . ALPRAZolam (XANAX) 0.5 MG tablet Take 0.25-0.5 mg by mouth 2 (two) times daily as needed for anxiety or sleep.       Marland Kitchen aspirin EC 81 MG tablet Take 81 mg by mouth once a week.      . B Complex-C (SUPER B COMPLEX PO) Take 1 tablet by mouth daily.       Marland Kitchen BIOTIN PO Take 1 tablet by mouth daily.       . Calcium Carbonate-Vitamin D (CALTRATE 600+D) 600-400 MG-UNIT per tablet Take 1 tablet by mouth daily.        . Cholecalciferol (VITAMIN D) 2000 UNITS CAPS Take 1 capsule by mouth daily.        Marland Kitchen conjugated estrogens (PREMARIN) vaginal cream Place 1 Applicatorful vaginally 3 (three) times a week.      . Cranberry 1000 MG CAPS Take 2 capsules by mouth daily.       Marland Kitchen doxycycline (VIBRA-TABS) 100 MG tablet Take 1 tablet (100 mg total) by mouth 2 (two) times daily.  28 tablet  0  . fluticasone (FLONASE) 50 MCG/ACT nasal spray Place 2 sprays into both nostrils at bedtime.      Marland Kitchen guaiFENesin (MUCINEX) 600 MG 12 hr tablet Take 1,200 mg by mouth 2 (two) times daily as needed for cough or to loosen  phlegm.       Marland Kitchen HYDROcodone-acetaminophen (NORCO/VICODIN) 5-325 MG per tablet Take 1/2 to 1 tablet by mouth three times daily as needed for pain.  NOT TO EXCEED THREE PER DAY.  90 tablet  0  . lisinopril (PRINIVIL,ZESTRIL) 10 MG tablet Take 5-10 mg by mouth every evening.      . meloxicam (MOBIC) 15 MG tablet Take 15 mg by mouth daily.      . Multiple Vitamins-Minerals (WOMENS MULTIVITAMIN PLUS PO) Take 1 tablet by mouth daily.        Marland Kitchen nystatin-triamcinolone (MYCOLOG II) cream Apply 1 application topically 4 (four) times daily as needed.       . ondansetron (ZOFRAN-ODT) 8 MG disintegrating tablet Take 1 tablet (8 mg total) by mouth 2 (two) times daily.  20 tablet  0  . verapamil (CALAN-SR) 180 MG CR tablet Take 90-180 mg by mouth every evening.      . vitamin B-12 (CYANOCOBALAMIN) 1000 MCG tablet Take 1,000 mcg by mouth daily.        Marland Kitchen VITAMIN E PO Take 1 tablet by mouth daily.        No facility-administered medications prior to visit.  PE: Blood pressure 115/78, pulse 64, temperature 97.7 F (36.5 C), temperature source Oral, resp. rate 16, height 5\' 1"  (1.549 m), weight 133 lb (60.328 kg), last menstrual period 06/27/1972, SpO2 98.00%. Gen: mild generalized weakness in moving from chair to exam table.  No focal weakness. AFFECT: pleasant, lucid thought and speech. WGN:FAOZ: no injection, icteris, swelling, or exudate.  EOMI, PERRLA. Mouth: lips without lesion/swelling.  Oral mucosa pink and moist. Oropharynx without erythema, exudate, or swelling.  CV: RRR, no m/r/g.   LUNGS: CTA bilat, nonlabored resps, good aeration in all lung fields. ABD: soft, NT, ND, BS normal.  No hepatospenomegaly or mass.  No bruits. EXT: no clubbing, cyanosis, or edema.    IMPRESSION AND PLAN:  Recent acute illness with lab abnormalties worrisome for tick borne infection. Improving with empiric doxy.  She may start taking this with food to ease nausea that comes when she takes it on empty stomach--finish  6 more days. Repeat CBC and CMET today.  An After Visit Summary was printed and given to the patient.  FOLLOW UP: 10d

## 2013-12-16 NOTE — Patient Instructions (Signed)
If blood pressure is less than 110 on top or 60 on bottom, do not take your lisinopril tab that day.

## 2013-12-16 NOTE — Progress Notes (Signed)
Pre visit review using our clinic review tool, if applicable. No additional management support is needed unless otherwise documented below in the visit note. 

## 2013-12-20 ENCOUNTER — Encounter: Payer: Self-pay | Admitting: Family Medicine

## 2013-12-25 ENCOUNTER — Encounter: Payer: Self-pay | Admitting: Family Medicine

## 2013-12-25 ENCOUNTER — Ambulatory Visit (INDEPENDENT_AMBULATORY_CARE_PROVIDER_SITE_OTHER): Payer: Medicare Other | Admitting: Family Medicine

## 2013-12-25 VITALS — BP 109/69 | HR 59 | Temp 97.8°F | Resp 16 | Ht 61.0 in | Wt 134.0 lb

## 2013-12-25 DIAGNOSIS — R05 Cough: Secondary | ICD-10-CM | POA: Diagnosis not present

## 2013-12-25 DIAGNOSIS — M545 Low back pain, unspecified: Secondary | ICD-10-CM | POA: Diagnosis not present

## 2013-12-25 DIAGNOSIS — I10 Essential (primary) hypertension: Secondary | ICD-10-CM

## 2013-12-25 DIAGNOSIS — A94 Unspecified arthropod-borne viral fever: Secondary | ICD-10-CM

## 2013-12-25 DIAGNOSIS — G8929 Other chronic pain: Secondary | ICD-10-CM

## 2013-12-25 DIAGNOSIS — R0982 Postnasal drip: Secondary | ICD-10-CM

## 2013-12-25 DIAGNOSIS — R059 Cough, unspecified: Secondary | ICD-10-CM | POA: Diagnosis not present

## 2013-12-25 DIAGNOSIS — B882 Other arthropod infestations: Secondary | ICD-10-CM | POA: Insufficient documentation

## 2013-12-25 MED ORDER — FLUTICASONE PROPIONATE 50 MCG/ACT NA SUSP: 2.0000 | Freq: Every day | NASAL | Status: DC

## 2013-12-25 MED ORDER — HYDROCODONE-ACETAMINOPHEN 5-325 MG PO TABS: ORAL_TABLET | ORAL | Status: DC

## 2013-12-25 NOTE — Progress Notes (Signed)
OFFICE NOTE  12/25/2013  CC:  Chief Complaint  Patient presents with  . Follow-up   HPI: Patient is a 68 y.o. Caucasian female who is here for f/u recent tick born illness.   She finished doxy 2 d/a and feels better and better every day--some fatigue still that is mild. Also with about 1 wk of cough with tickle in throat.  No signif nasal sx's, no fever, no mucous production, no wheezing or chest tightness.  Was using mucinex and this helped some but she is out of it.  Rarely checks bp at home, but when she does she notes it is usually normal, sometimes low normal like she has today. Denies orthostatic dizziness, generalized weakness, SOB, CP, light headed feeling, or presyncopal feeling.  No palpitations.  No dysuria, no increased urinary frequency, no increased urinary urgency.  Some incomplete emptying that is chronic. No nausea or abd pain or flank pain.  Pertinent PMH:  Past medical, surgical, social, and family history reviewed and no changes are noted since last office visit.  MEDS:  Outpatient Prescriptions Prior to Visit  Medication Sig Dispense Refill  . ALPRAZolam (XANAX) 0.5 MG tablet Take 0.25-0.5 mg by mouth 2 (two) times daily as needed for anxiety or sleep.       Marland Kitchen aspirin EC 81 MG tablet Take 81 mg by mouth once a week.      . B Complex-C (SUPER B COMPLEX PO) Take 1 tablet by mouth daily.       Marland Kitchen BIOTIN PO Take 1 tablet by mouth daily.       . Calcium Carbonate-Vitamin D (CALTRATE 600+D) 600-400 MG-UNIT per tablet Take 1 tablet by mouth daily.        . Cholecalciferol (VITAMIN D) 2000 UNITS CAPS Take 1 capsule by mouth daily.        Marland Kitchen conjugated estrogens (PREMARIN) vaginal cream Place 1 Applicatorful vaginally 3 (three) times a week.      . Cranberry 1000 MG CAPS Take 2 capsules by mouth daily.       . fluconazole (DIFLUCAN) 150 MG tablet       . fluticasone (FLONASE) 50 MCG/ACT nasal spray Place 2 sprays into both nostrils at bedtime.      Marland Kitchen guaiFENesin (MUCINEX)  600 MG 12 hr tablet Take 1,200 mg by mouth 2 (two) times daily as needed for cough or to loosen phlegm.       Marland Kitchen HYDROcodone-acetaminophen (NORCO/VICODIN) 5-325 MG per tablet Take 1/2 to 1 tablet by mouth three times daily as needed for pain.  NOT TO EXCEED THREE PER DAY.  90 tablet  0  . lisinopril (PRINIVIL,ZESTRIL) 10 MG tablet Take 5-10 mg by mouth every evening.      . meloxicam (MOBIC) 15 MG tablet Take 15 mg by mouth daily.      . Multiple Vitamins-Minerals (WOMENS MULTIVITAMIN PLUS PO) Take 1 tablet by mouth daily.        Marland Kitchen nystatin-triamcinolone (MYCOLOG II) cream Apply 1 application topically 4 (four) times daily as needed.       . verapamil (CALAN-SR) 180 MG CR tablet Take 90-180 mg by mouth every evening.      . vitamin B-12 (CYANOCOBALAMIN) 1000 MCG tablet Take 1,000 mcg by mouth daily.        Marland Kitchen VITAMIN E PO Take 1 tablet by mouth daily.       Marland Kitchen doxycycline (VIBRA-TABS) 100 MG tablet Take 1 tablet (100 mg total) by mouth 2 (two) times  daily.  28 tablet  0  . ondansetron (ZOFRAN-ODT) 8 MG disintegrating tablet Take 1 tablet (8 mg total) by mouth 2 (two) times daily.  20 tablet  0   No facility-administered medications prior to visit.    PE: Blood pressure 109/69, pulse 59, temperature 97.8 F (36.6 C), temperature source Oral, resp. rate 16, height 5\' 1"  (1.549 m), weight 134 lb (60.782 kg), last menstrual period 06/27/1972, SpO2 97.00%. Gen: Alert, well appearing.  Patient is oriented to person, place, time, and situation. ENT: Ears: EACs clear, normal epithelium.  TMs with good light reflex and landmarks bilaterally.  Eyes: no injection, icteris, swelling, or exudate.  EOMI, PERRLA. Nose: no drainage or turbinate edema/swelling.  No injection or focal lesion.  Mouth: lips without lesion/swelling.  Oral mucosa pink and moist.  Dentition intact and without obvious caries or gingival swelling.  Oropharynx without erythema, exudate, or swelling.  Neck - No masses or thyromegaly or  limitation in range of motion CV: RRR, no m/r/g.   LUNGS: CTA bilat, nonlabored resps, good aeration in all lung fields. EXT: no clubbing, cyanosis, or edema.    IMPRESSION AND PLAN:  1) Cough--PND from mild viral URI vs allergic rhinitis. Reassured.  Continue mucinex DM OTC.  2) Tick borne dz: resolved s/p treatment with doxycycline, just a bit of residual fatigue.  3) HTN; bp low-normal, patient denies sx's of hypotension. Will have her stop lisinopril for now.  Monitor bp/hr off this med--goal bp 110-140 syst, 60-90 diast.  4) Chronic LBP/neck pain: continue daily mobic and prn use of vicodin--she uses this judiciously. New rx for 5/325, #90 handed to pt today.  Her neurosurgeon has done a L-spine steroid injection in the past and it didn't help much AND it apparently caused brisk BP elevation.  An After Visit Summary was printed and given to the patient.  FOLLOW UP: 3 mo--30 min visit to f/u chronic med issues and do routine labs (fasting)

## 2013-12-25 NOTE — Progress Notes (Signed)
Pre visit review using our clinic review tool, if applicable. No additional management support is needed unless otherwise documented below in the visit note. 

## 2013-12-25 NOTE — Patient Instructions (Addendum)
Get OTC mucinex DM and take as directed on packaging.  Stop lisinopril for now. Check blood pressure daily for 5d. Your goal bp is 110-140 over 60-90.

## 2013-12-26 ENCOUNTER — Telehealth: Payer: Self-pay | Admitting: Family Medicine

## 2013-12-26 ENCOUNTER — Ambulatory Visit: Payer: Medicare Other | Admitting: Family Medicine

## 2013-12-26 NOTE — Telephone Encounter (Signed)
Relevant patient education assigned to patient using Emmi. ° °

## 2014-01-26 ENCOUNTER — Other Ambulatory Visit: Payer: Self-pay | Admitting: Pulmonary Disease

## 2014-01-27 ENCOUNTER — Telehealth: Payer: Self-pay | Admitting: Family Medicine

## 2014-01-27 ENCOUNTER — Other Ambulatory Visit: Payer: Self-pay | Admitting: Family Medicine

## 2014-01-27 MED ORDER — LISINOPRIL 10 MG PO TABS: ORAL_TABLET | ORAL | Status: DC

## 2014-01-27 NOTE — Telephone Encounter (Signed)
Pt called stating that she believes she needs to go back on lisinopril.   She states the her BP has been running at the higher part of her normal around 140/90.   Please advise.

## 2014-01-27 NOTE — Telephone Encounter (Signed)
Pt last alprazolam was filled 07/25/13 x 6 rfs.  Please advise RF.

## 2014-01-27 NOTE — Telephone Encounter (Signed)
OK. I sent more lisinopril 10mg  tabs in to pharmacy today. Tell her to restart at 1/2 tab a day for a few days and go up to whole tab if bp average is more than 140/90 still.-thx

## 2014-01-27 NOTE — Telephone Encounter (Signed)
Alprazolam rx printed. 

## 2014-01-27 NOTE — Telephone Encounter (Signed)
Patient aware of new medication instructions.  Voiced understanding.

## 2014-02-07 ENCOUNTER — Encounter: Payer: Self-pay | Admitting: Women's Health

## 2014-02-07 ENCOUNTER — Ambulatory Visit (INDEPENDENT_AMBULATORY_CARE_PROVIDER_SITE_OTHER): Payer: Medicare Other | Admitting: Women's Health

## 2014-02-07 DIAGNOSIS — B3731 Acute candidiasis of vulva and vagina: Secondary | ICD-10-CM

## 2014-02-07 DIAGNOSIS — N898 Other specified noninflammatory disorders of vagina: Secondary | ICD-10-CM | POA: Diagnosis not present

## 2014-02-07 DIAGNOSIS — B373 Candidiasis of vulva and vagina: Secondary | ICD-10-CM | POA: Diagnosis not present

## 2014-02-07 DIAGNOSIS — R300 Dysuria: Secondary | ICD-10-CM

## 2014-02-07 LAB — URINALYSIS W MICROSCOPIC + REFLEX CULTURE
Bilirubin Urine: NEGATIVE
Casts: NONE SEEN
Crystals: NONE SEEN
Glucose, UA: NEGATIVE mg/dL
Hgb urine dipstick: NEGATIVE
Ketones, ur: NEGATIVE mg/dL
Nitrite: NEGATIVE
Protein, ur: NEGATIVE mg/dL
RBC / HPF: NONE SEEN RBC/hpf (ref <3)
Specific Gravity, Urine: 1.015 (ref 1.005–1.030)
Urobilinogen, UA: 0.2 mg/dL (ref 0.0–1.0)
pH: 6.5 (ref 5.0–8.0)

## 2014-02-07 LAB — WET PREP FOR TRICH, YEAST, CLUE
Clue Cells Wet Prep HPF POC: NONE SEEN
Trich, Wet Prep: NONE SEEN
WBC, Wet Prep HPF POC: NONE SEEN

## 2014-02-07 MED ORDER — FLUCONAZOLE 150 MG PO TABS: 150.0000 mg | ORAL_TABLET | Freq: Once | ORAL | Status: DC

## 2014-02-07 NOTE — Patient Instructions (Signed)

## 2014-02-07 NOTE — Progress Notes (Signed)
Patient ID: Alison Cuevas, female   DOB: Dec 01, 1945, 68 y.o.   MRN: [id number] Presents with complaint of vaginal burning/irritation with slight itching for several days. Some low abdominal discomfort, denies urinary pain, burning or fever. States cystocele  makes urinating difficult. Denies incontinence. Unable to tolerate pessary. Has seen Dr. Matilde Sprang and is contemplating surgical repair.   Exam: Appears uncomfortable. External genitalia extremely erythematous. +2 cystocele. Wet prep done with a Q-tip positive for yeast. UA: Trace leukocytes, 0-2 WBC, few bacteria.  Yeast vaginitis Cystocele  Plan: Diflucan 150 by mouth times one dose.  Yeast prevention discussed, A&D ointment to external genitalia, please clothing. Instructed to call if no relief of symptoms. Urine culture pending.Encouraged followup with Dr. Matilde Sprang for surgical repair.

## 2014-02-10 LAB — URINE CULTURE: Colony Count: 40000

## 2014-02-11 ENCOUNTER — Telehealth: Payer: Self-pay

## 2014-02-11 MED ORDER — CIPROFLOXACIN HCL 250 MG PO TABS: 250.0000 mg | ORAL_TABLET | Freq: Two times a day (BID) | ORAL | Status: DC

## 2014-02-11 NOTE — Telephone Encounter (Signed)
Ok please call in cipro for pt.

## 2014-02-11 NOTE — Telephone Encounter (Signed)
Rx in. Patient advised.

## 2014-02-11 NOTE — Telephone Encounter (Signed)
Message copied by Ramond Craver on Tue Feb 11, 2014 10:35 AM ------      Message from: [place], [place] J      Created: Mon Feb 10, 2014  5:07 PM       Please call and ask if doing better, she was treated for yeast at [place].If still not feeling better, review she did have small amt of bacteria in urine, cipro 250 mg twice daily for 3 days may help. ------

## 2014-02-11 NOTE — Telephone Encounter (Signed)
Patient said she is some better.  She said still when the cream is not on the outside it burns.  She said she is having burning when she urinates too and after she goes to urinate and short time later she feels a sense of urgency to again.  She said she would just feel better if you go on and treat her for whatever you found.  She also wanted you to know she call Dr. Mikle Bosworth office to arrange surgery. They will be calling her but did tell her it might be several months before he can do her surgery.

## 2014-02-12 ENCOUNTER — Other Ambulatory Visit: Payer: Self-pay | Admitting: Urology

## 2014-03-10 ENCOUNTER — Encounter (HOSPITAL_COMMUNITY): Payer: Self-pay | Admitting: Pharmacy Technician

## 2014-03-14 ENCOUNTER — Encounter (HOSPITAL_COMMUNITY)
Admission: RE | Admit: 2014-03-14 | Discharge: 2014-03-14 | Disposition: A | Discharge status: Home / Self Care | Payer: Medicare Other | Source: Ambulatory Visit | Attending: Urology | Admitting: Urology

## 2014-03-14 ENCOUNTER — Encounter (HOSPITAL_COMMUNITY): Payer: Self-pay

## 2014-03-14 DIAGNOSIS — N952 Postmenopausal atrophic vaginitis: Secondary | ICD-10-CM | POA: Diagnosis not present

## 2014-03-14 DIAGNOSIS — N814 Uterovaginal prolapse, unspecified: Secondary | ICD-10-CM | POA: Insufficient documentation

## 2014-03-14 DIAGNOSIS — D649 Anemia, unspecified: Secondary | ICD-10-CM | POA: Insufficient documentation

## 2014-03-14 DIAGNOSIS — Z01810 Encounter for preprocedural cardiovascular examination: Secondary | ICD-10-CM | POA: Diagnosis not present

## 2014-03-14 DIAGNOSIS — Z01812 Encounter for preprocedural laboratory examination: Secondary | ICD-10-CM | POA: Diagnosis not present

## 2014-03-14 DIAGNOSIS — I10 Essential (primary) hypertension: Secondary | ICD-10-CM | POA: Diagnosis not present

## 2014-03-14 HISTORY — DX: Asymptomatic varicose veins of unspecified lower extremity: I83.90

## 2014-03-14 HISTORY — DX: Phlebitis and thrombophlebitis of unspecified site: I80.9

## 2014-03-14 LAB — BASIC METABOLIC PANEL
Anion gap: 13 (ref 5–15)
BUN: 14 mg/dL (ref 6–23)
CO2: 25 mEq/L (ref 19–32)
Calcium: 9.3 mg/dL (ref 8.4–10.5)
Chloride: 101 mEq/L (ref 96–112)
Creatinine, Ser: 0.77 mg/dL (ref 0.50–1.10)
GFR calc Af Amer: >90 mL/min (ref >90)
GFR calc non Af Amer: 84 mL/min — ABNORMAL LOW (ref >90)
Glucose, Bld: 128 mg/dL — ABNORMAL HIGH (ref 70–99)
Potassium: 4.2 mEq/L (ref 3.7–5.3)
Sodium: 139 mEq/L (ref 137–147)

## 2014-03-14 LAB — CBC
HCT: 37.8 % (ref 36.0–46.0)
Hemoglobin: 13.1 g/dL (ref 12.0–15.0)
MCH: 29.8 pg (ref 26.0–34.0)
MCHC: 34.7 g/dL (ref 30.0–36.0)
MCV: 86.1 fL (ref 78.0–100.0)
Platelets: 176 10*3/uL (ref 150–400)
RBC: 4.39 MIL/uL (ref 3.87–5.11)
RDW: 12.9 % (ref 11.5–15.5)
WBC: 6.3 10*3/uL (ref 4.0–10.5)

## 2014-03-14 LAB — PROTIME-INR
INR: 1.02 (ref 0.00–1.49)
Prothrombin Time: 13.4 seconds (ref 11.6–15.2)

## 2014-03-14 LAB — APTT: aPTT: 29 seconds (ref 24–37)

## 2014-03-14 LAB — ABO/RH: ABO/RH(D): A POS

## 2014-03-14 NOTE — Patient Instructions (Addendum)
Alison Cuevas  03/14/2014   Your procedure is scheduled on:  03/18/2014    Report to [facility].  Follow the Signs to [place] at 0530       am  Call this number if you have problems the morning of surgery: [phone]   Remember: Fleets enema nite before surgery.     Do not eat food or drink liquids after midnight.   Take these medicines the morning of surgery with A SIP OF WATER: xanax if needed, hydrocodone if needed    Do not wear jewelry, make-up or nail polish.  Do not wear lotions, powders, or perfumes.  deodorant.  Do not shave 48 hours prior to surgery.   Do not bring valuables to the hospital.  Contacts, dentures or bridgework may not be worn into surgery.  Leave suitcase in the car. After surgery it may be brought to your room.  For patients admitted to the hospital, checkout time is 11:00 AM the day of  discharge.          Please read over the following fact sheets that you were given: [facility] - Preparing for Surgery Before surgery, you can play an important role.  Because skin is not sterile, your skin needs to be as free of germs as possible.  You can reduce the number of germs on your skin by washing with CHG (chlorahexidine gluconate) soap before surgery.  CHG is an antiseptic cleaner which kills germs and bonds with the skin to continue killing germs even after washing. Please DO NOT use if you have an allergy to CHG or antibacterial soaps.  If your skin becomes reddened/irritated stop using the CHG and inform your nurse when you arrive at Short Stay. Do not shave (including legs and underarms) for at least 48 hours prior to the first CHG shower.  You may shave your face/neck. Please follow these instructions carefully:  1.  Shower with CHG Soap the night before surgery and the  morning of Surgery.  2.  If you choose to wash your hair, wash your hair first as usual with your  normal  shampoo.  3.  After you shampoo, rinse your hair and body  thoroughly to remove the  shampoo.                           4.  Use CHG as you would any other liquid soap.  You can apply chg directly  to the skin and wash                       Gently with a scrungie or clean washcloth.  5.  Apply the CHG Soap to your body ONLY FROM THE NECK DOWN.   Do not use on face/ open                           Wound or open sores. Avoid contact with eyes, ears mouth and genitals (private parts).                       Wash face,  Genitals (private parts) with your normal soap.             6.  Wash thoroughly, paying special attention to the area where your surgery  will be performed.  7.  Thoroughly rinse your body with warm water from  the neck down.  8.  DO NOT shower/wash with your normal soap after using and rinsing off  the CHG Soap.                9.  Pat yourself dry with a clean towel.            10.  Wear clean pajamas.            11.  Place clean sheets on your bed the night of your first shower and do not  sleep with pets. Day of Surgery : Do not apply any lotions/deodorants the morning of surgery.  Please wear clean clothes to the hospital/surgery center.  FAILURE TO FOLLOW THESE INSTRUCTIONS MAY RESULT IN THE CANCELLATION OF YOUR SURGERY PATIENT SIGNATURE_________________________________  NURSE SIGNATURE__________________________________  ________________________________________________________________________  WHAT IS A BLOOD TRANSFUSION? Blood Transfusion Information  A transfusion is the replacement of blood or some of its parts. Blood is made up of multiple cells which provide different functions.  Red blood cells carry oxygen and are used for blood loss replacement.  White blood cells fight against infection.  Platelets control bleeding.  Plasma helps clot blood.  Other blood products are available for specialized needs, such as hemophilia or other clotting disorders. BEFORE THE TRANSFUSION  Who gives blood for transfusions?   Healthy  volunteers who are fully evaluated to make sure their blood is safe. This is blood bank blood. Transfusion therapy is the safest it has ever been in the practice of medicine. Before blood is taken from a donor, a complete history is taken to make sure that person has no history of diseases nor engages in risky social behavior (examples are intravenous drug use or sexual activity with multiple partners). The donor's travel history is screened to minimize risk of transmitting infections, such as malaria. The donated blood is tested for signs of infectious diseases, such as HIV and hepatitis. The blood is then tested to be sure it is compatible with you in order to minimize the chance of a transfusion reaction. If you or a relative donates blood, this is often done in anticipation of surgery and is not appropriate for emergency situations. It takes many days to process the donated blood. RISKS AND COMPLICATIONS Although transfusion therapy is very safe and saves many lives, the main dangers of transfusion include:   Getting an infectious disease.  Developing a transfusion reaction. This is an allergic reaction to something in the blood you were given. Every precaution is taken to prevent this. The decision to have a blood transfusion has been considered carefully by your caregiver before blood is given. Blood is not given unless the benefits outweigh the risks. AFTER THE TRANSFUSION  Right after receiving a blood transfusion, you will usually feel much better and more energetic. This is especially true if your red blood cells have gotten low (anemic). The transfusion raises the level of the red blood cells which carry oxygen, and this usually causes an energy increase.  The nurse administering the transfusion will monitor you carefully for complications. HOME CARE INSTRUCTIONS  No special instructions are needed after a transfusion. You may find your energy is better. Speak with your caregiver about any  limitations on activity for underlying diseases you may have. SEEK MEDICAL CARE IF:   Your condition is not improving after your transfusion.  You develop redness or irritation at the intravenous (IV) site. SEEK IMMEDIATE MEDICAL CARE IF:  Any of the following symptoms occur over the next 12  hours:  Shaking chills.  You have a temperature by mouth above 102 F (38.9 C), not controlled by medicine.  Chest, back, or muscle pain.  People around you feel you are not acting correctly or are confused.  Shortness of breath or difficulty breathing.  Dizziness and fainting.  You get a rash or develop hives.  You have a decrease in urine output.  Your urine turns a dark color or changes to pink, red, or brown. Any of the following symptoms occur over the next 10 days:  You have a temperature by mouth above 102 F (38.9 C), not controlled by medicine.  Shortness of breath.  Weakness after normal activity.  The white part of the eye turns yellow (jaundice).  You have a decrease in the amount of urine or are urinating less often.  Your urine turns a dark color or changes to pink, red, or brown. Document Released: 06/10/2000 Document Revised: 09/05/2011 Document Reviewed: 01/28/2008 ExitCare Patient Information 2014 [place].  _______________________________________________________________________, coughing and deep breathing exercises, leg exercises

## 2014-03-17 MED ORDER — DEXTROSE 5 % IV SOLN
260.0000 mg | INTRAVENOUS | Status: AC
  Administered 2014-03-18: 260 mg via INTRAVENOUS
  Filled 2014-03-17 (×2): qty 6.5

## 2014-03-17 NOTE — H&P (Signed)
History of Present Illness   Alison Cuevas has an urgent bladder but is continent. She has frequency. She has urgency and can sit and cannot urinate. She gets up twice at night. She can feel vaginal bulging. She had a grade 2 cystocele and rectocele a few years ago and has failed 2 pessary trials recently by Dr Phineas Real. On pelvic examination she had a small grade 3 cystocele. Vaginal cuff descended 3 cm. She had diffuse moderate grade 2 rectocele. I could not see an obvious enterocele. The cystocele reached below the urethrovesical angle but not to the introitus. She had a negative cough test with hypermobility of the bladder neck after cystoscopy with the cystocele reduced. He does splints with urination. In the past she has had incontinence with laughing. I thought if she ever had surgery, she likely benefit from a transvaginal vault suspension with cystocele repair and rectocele repair.   She did not void and was catheterized for 125 mL. Maximum capacity was 480 mL. Bladder was stable. She did not leak with a Valsalva pressure of 77 cmH2O. During voluntary voiding she voided 377 mL with a maximum flow of 13 mL/sec. Maximum voiding pressure of 13 cmH2O. Residual was 100 mL. Her initial residual on her first visit was 52 mL. Bladder neck descended approximately 2 cm. She had a moderate cystocele noted that was reduced. Demonstrated under fluoroscopy. Bladder was mildly trabeculated. The details of the urodynamics are signed and dictated on the urodynamic sheet.    Past Medical History Problems  1. History of Anxiety (300.00) 2. History of Arthritis (V13.4) 3. History of depression (V11.8) 4. History of hypercholesterolemia (V12.29) 5. History of hypertension (V12.59) 6. History of Thrombophlebitis Of Deep Vessels Of The Lower Extremity (V12.51)  Surgical History Problems  1. History of Cholecystectomy 2. History of Hysterectomy 3. History of Leg Repair 4. History of Neck Surgery 5. History of  Nose Surgery 6. History of Surgery Suture Of Gum Laceration  Current Meds 1. ALPRAZolam 0.5 MG Oral Tablet;  Therapy: (Recorded:08May2015) to Recorded 2. Aspirin 81 MG Oral Tablet Chewable;  Therapy: (Recorded:08May2015) to Recorded 3. Biotin TABS;  Therapy: (Recorded:08May2015) to Recorded 4. Caltrate 600 TABS;  Therapy: (Recorded:08May2015) to Recorded 5. Clobetasol Propionate 0.05 % External Cream;  Therapy: (Recorded:08May2015) to Recorded 6. Cranberry TABS;  Therapy: (Recorded:08May2015) to Recorded 7. Fluticasone Propionate 50 MCG/ACT Nasal Suspension;  Therapy: 26Jan2015 to Recorded 8. Hydrocodone-Acetaminophen 5-325 MG Oral Tablet;  Therapy: [id number] to Recorded 9. Lisinopril 10 MG Oral Tablet;  Therapy: (Recorded:08May2015) to Recorded 10. Meloxicam 15 MG Oral Tablet;   Therapy: (Recorded:08May2015) to Recorded 11. MetroNIDAZOLE 500 MG Oral Tablet;   Therapy: [id number] to Recorded 12. MiraLax PACK;   Therapy: (Recorded:29Apr2010) to Recorded 13. Mucinex 600 MG Oral Tablet Extended Release 12 Hour;   Therapy: (Recorded:29Apr2010) to Recorded 14. Multi-Vitamin TABS;   Therapy: (Recorded:08May2015) to Recorded 15. Niacin 250 MG Oral Tablet;   Therapy: (Recorded:08May2015) to Recorded 16. Nystatin-Triamcinolone CREA;   Therapy: (Recorded:08May2015) to Recorded 17. Premarin 0.625 MG/GM Vaginal Cream;   Therapy: [id number] to Recorded 18. Super B Complex TABS;   Therapy: (Recorded:08May2015) to Recorded 19. Verapamil HCl ER 180 MG Oral Tablet Extended Release;   Therapy: [id number] to Recorded 20. Vitamin B-12 100 MCG Oral Tablet;   Therapy: (Recorded:08May2015) to Recorded 21. Vitamin D 2000 UNIT Oral Tablet;   Therapy: (Recorded:08May2015) to Recorded 22. Vitamin E TABS;   Therapy: (Recorded:08May2015) to Recorded  Allergies Medication  1. No Known Drug Allergies  Family History Problems  1. Family history of Hematuria : Mother 2. Family history of  Hematuria : Sister 3. Family history of Nephrolithiasis : Sister 4. Family history of Nephrolithiasis : Sister 50. Family history of Nephrolithiasis : Brother  Social History Problems  1. Denied: History of Alcohol Use 2. Caffeine Use   2 a day 3. Death in the family, father   age 67 due to aneurysm 4. Death in the family, mother   age 40 due to tear in colon 5. Marital History - Currently Married 6. Occupation: Retired 81. Denied: History of Tobacco Use  Assessment Assessed  1. Cystocele, midline (618.01) 2. Rectocele (618.04)  Plan Cystocele, midline  1. Start: Fluconazole 100 MG Oral Tablet (Diflucan); Take 100 mg daily x 3 days 2. Follow-up PRN Office  Follow-up  Status: Complete  Done: [id number]  Discussion/Summary   A picture was drawn. Continue watchful waiting discussed. I discussed a transvaginal hysterectomy with vault suspension with cystocele repair and graft and rectocele repair. I do not recommend a sling.   I drew her a picture and we talked about prolapse surgery in detail. Pros, cons, general surgical and anesthetic risks, and other options including behavioral therapy, pessaries, and watchful waiting were discussed. She understands that prolapse repairs are successful in 80-85% of cases for prolapse symptoms and can recur anteriorly, posteriorly, and/or apically. She understands that in most cases I use a graft and general risks were discussed. Surgical risks were described but not limited to the discussion of injury to neighboring structures including the bowel (with possible life-threatening sepsis and colostomy), bladder, urethra, vagina (all resulting in further surgery), and ureter (resulting in re-implantation). We talked about injury to nerves/soft tissue leading to debilitating and intractable pelvic, abdominal, and lower extremity pain syndromes and neuropathies. The risks of buttock pain, intractable dyspareunia, and vaginal narrowing and shortening with  sequelae were discussed. Bleeding risks, transfusion rates, and infection were discussed. The risk of persistent, de novo, or worsening bladder and/or bowel incontinence/dysfunction was discussed. The need for CIC was described as well the usual post-operative course. The patient understands that she might not reach her treatment goal and that she might be worse following surgery.  Mesh issues on TV discussed.  Worsening incontinence discussed. She does have an overactive bladder with frequency and nocturia and at times can leak with laughing.   Alison Cuevas wanted me to call in [place] for some vaginal irritation. She is taking some medication for a tick bite and thought it may have caused it. Diflucan 100 mg daily for 3 days sent to pharmacy. Follow up with gynecology if not better.  A copy of my picture given. She is going to think about it. Phone number given. I am going to send a copy of my note to Dr Mickel Baas to keep him updated on her treatment course.  After a thorough review of the management options for the patient's condition the patient  elected to proceed with surgical therapy as noted above. We have discussed the potential benefits and risks of the procedure, side effects of the proposed treatment, the likelihood of the patient achieving the goals of the procedure, and any potential problems that might occur during the procedure or recuperation. Informed consent has been obtained.

## 2014-03-18 ENCOUNTER — Encounter (HOSPITAL_COMMUNITY): Payer: Medicare Other | Admitting: Anesthesiology

## 2014-03-18 ENCOUNTER — Ambulatory Visit (HOSPITAL_COMMUNITY): Payer: Medicare Other | Admitting: Anesthesiology

## 2014-03-18 ENCOUNTER — Encounter (HOSPITAL_COMMUNITY)
Admission: RE | Disposition: A | Discharge status: Home / Self Care | Payer: Medicare Other | Source: Ambulatory Visit | Attending: Urology

## 2014-03-18 ENCOUNTER — Ambulatory Visit (HOSPITAL_COMMUNITY)
Admission: RE | Admit: 2014-03-18 | Discharge: 2014-03-20 | Disposition: A | Discharge status: Home / Self Care | Payer: Medicare Other | Source: Ambulatory Visit | Attending: Urology | Admitting: Urology

## 2014-03-18 ENCOUNTER — Encounter (HOSPITAL_COMMUNITY): Payer: Self-pay

## 2014-03-18 DIAGNOSIS — N8111 Cystocele, midline: Secondary | ICD-10-CM | POA: Diagnosis not present

## 2014-03-18 DIAGNOSIS — F3289 Other specified depressive episodes: Secondary | ICD-10-CM | POA: Insufficient documentation

## 2014-03-18 DIAGNOSIS — Z7982 Long term (current) use of aspirin: Secondary | ICD-10-CM | POA: Diagnosis not present

## 2014-03-18 DIAGNOSIS — N816 Rectocele: Secondary | ICD-10-CM | POA: Diagnosis not present

## 2014-03-18 DIAGNOSIS — N993 Prolapse of vaginal vault after hysterectomy: Secondary | ICD-10-CM | POA: Diagnosis not present

## 2014-03-18 DIAGNOSIS — F411 Generalized anxiety disorder: Secondary | ICD-10-CM | POA: Diagnosis not present

## 2014-03-18 DIAGNOSIS — E7800 Pure hypercholesterolemia, unspecified: Secondary | ICD-10-CM | POA: Diagnosis not present

## 2014-03-18 DIAGNOSIS — M129 Arthropathy, unspecified: Secondary | ICD-10-CM | POA: Diagnosis not present

## 2014-03-18 DIAGNOSIS — K219 Gastro-esophageal reflux disease without esophagitis: Secondary | ICD-10-CM | POA: Diagnosis not present

## 2014-03-18 DIAGNOSIS — Z79899 Other long term (current) drug therapy: Secondary | ICD-10-CM | POA: Diagnosis not present

## 2014-03-18 DIAGNOSIS — M545 Low back pain, unspecified: Secondary | ICD-10-CM | POA: Diagnosis not present

## 2014-03-18 DIAGNOSIS — IMO0002 Reserved for concepts with insufficient information to code with codable children: Secondary | ICD-10-CM | POA: Diagnosis present

## 2014-03-18 DIAGNOSIS — F329 Major depressive disorder, single episode, unspecified: Secondary | ICD-10-CM | POA: Insufficient documentation

## 2014-03-18 DIAGNOSIS — I10 Essential (primary) hypertension: Secondary | ICD-10-CM | POA: Diagnosis not present

## 2014-03-18 DIAGNOSIS — N8110 Cystocele, unspecified: Secondary | ICD-10-CM | POA: Diagnosis not present

## 2014-03-18 HISTORY — PX: ANTERIOR AND POSTERIOR REPAIR: SHX5121

## 2014-03-18 HISTORY — PX: CYSTO: SHX6284

## 2014-03-18 LAB — TYPE AND SCREEN
ABO/RH(D): A POS
Antibody Screen: NEGATIVE

## 2014-03-18 LAB — HEMOGLOBIN AND HEMATOCRIT, BLOOD
HCT: 32 % — ABNORMAL LOW (ref 36.0–46.0)
Hemoglobin: 11.4 g/dL — ABNORMAL LOW (ref 12.0–15.0)

## 2014-03-18 SURGERY — CYSTO
Anesthesia: General

## 2014-03-18 MED ORDER — FLEET ENEMA 7-19 GM/118ML RE ENEM: 1.0000 | ENEMA | Freq: Once | RECTAL | Status: DC

## 2014-03-18 MED ORDER — PHENAZOPYRIDINE HCL 200 MG PO TABS
200.0000 mg | ORAL_TABLET | Freq: Once | ORAL | Status: AC
  Administered 2014-03-18: 200 mg via ORAL
  Filled 2014-03-18: qty 1

## 2014-03-18 MED ORDER — LIDOCAINE-EPINEPHRINE (PF) 1 %-1:200000 IJ SOLN
INTRAMUSCULAR | Status: DC | PRN
  Administered 2014-03-18: 38 mL

## 2014-03-18 MED ORDER — CISATRACURIUM BESYLATE 20 MG/10ML IV SOLN
INTRAVENOUS | Status: AC
  Filled 2014-03-18: qty 10

## 2014-03-18 MED ORDER — FENTANYL CITRATE 0.05 MG/ML IJ SOLN: 25.0000 ug | INTRAMUSCULAR | Status: DC | PRN

## 2014-03-18 MED ORDER — ESTRADIOL 0.1 MG/GM VA CREA
TOPICAL_CREAM | VAGINAL | Status: AC
  Filled 2014-03-18: qty 85

## 2014-03-18 MED ORDER — DEXTROSE-NACL 5-0.45 % IV SOLN
INTRAVENOUS | Status: DC
  Administered 2014-03-18 (×2): via INTRAVENOUS
  Administered 2014-03-18: 1000 mL via INTRAVENOUS
  Administered 2014-03-19 – 2014-03-20 (×2): via INTRAVENOUS

## 2014-03-18 MED ORDER — LIDOCAINE-EPINEPHRINE (PF) 1 %-1:200000 IJ SOLN
INTRAMUSCULAR | Status: AC
  Filled 2014-03-18: qty 10

## 2014-03-18 MED ORDER — HYDROCODONE-ACETAMINOPHEN 5-325 MG PO TABS: 1.0000 | ORAL_TABLET | Freq: Four times a day (QID) | ORAL | Status: DC | PRN

## 2014-03-18 MED ORDER — SODIUM CHLORIDE 0.9 % IV SOLN
1.5000 g | INTRAVENOUS | Status: AC
  Administered 2014-03-18: 1.5 g via INTRAVENOUS
  Filled 2014-03-18: qty 1.5

## 2014-03-18 MED ORDER — LIDOCAINE-EPINEPHRINE 1 %-1:100000 IJ SOLN
INTRAMUSCULAR | Status: AC
  Filled 2014-03-18: qty 1

## 2014-03-18 MED ORDER — ONDANSETRON 4 MG PO TBDP
ORAL_TABLET | ORAL | Status: AC
  Filled 2014-03-18: qty 1

## 2014-03-18 MED ORDER — ONDANSETRON HCL 4 MG/2ML IJ SOLN
INTRAMUSCULAR | Status: AC
  Filled 2014-03-18: qty 2

## 2014-03-18 MED ORDER — LIDOCAINE HCL (CARDIAC) 20 MG/ML IV SOLN
INTRAVENOUS | Status: AC
  Filled 2014-03-18: qty 5

## 2014-03-18 MED ORDER — NEOSTIGMINE METHYLSULFATE 10 MG/10ML IV SOLN
INTRAVENOUS | Status: DC | PRN
  Administered 2014-03-18: 3 mg via INTRAVENOUS

## 2014-03-18 MED ORDER — SUFENTANIL CITRATE 50 MCG/ML IV SOLN
INTRAVENOUS | Status: DC | PRN
  Administered 2014-03-18: 10 ug via INTRAVENOUS
  Administered 2014-03-18 (×3): 5 ug via INTRAVENOUS

## 2014-03-18 MED ORDER — DOCUSATE SODIUM 100 MG PO CAPS: 100.0000 mg | ORAL_CAPSULE | Freq: Two times a day (BID) | ORAL | Status: DC

## 2014-03-18 MED ORDER — LACTATED RINGERS IV SOLN: INTRAVENOUS | Status: DC

## 2014-03-18 MED ORDER — ALPRAZOLAM 0.5 MG PO TABS
0.5000 mg | ORAL_TABLET | Freq: Three times a day (TID) | ORAL | Status: DC | PRN
  Administered 2014-03-19: 0.5 mg via ORAL
  Filled 2014-03-18: qty 1

## 2014-03-18 MED ORDER — ONDANSETRON HCL 4 MG/2ML IJ SOLN
INTRAMUSCULAR | Status: DC | PRN
  Administered 2014-03-18: 4 mg via INTRAVENOUS

## 2014-03-18 MED ORDER — VERAPAMIL HCL ER 180 MG PO TBCR
180.0000 mg | EXTENDED_RELEASE_TABLET | Freq: Every day | ORAL | Status: DC
  Administered 2014-03-18: 180 mg via ORAL
  Filled 2014-03-18 (×2): qty 1

## 2014-03-18 MED ORDER — POLYMYXIN B SULFATE 500000 UNITS IJ SOLR
INTRAMUSCULAR | Status: DC | PRN
  Administered 2014-03-18: 08:00:00

## 2014-03-18 MED ORDER — PROMETHAZINE HCL 25 MG/ML IJ SOLN
12.5000 mg | Freq: Three times a day (TID) | INTRAMUSCULAR | Status: DC | PRN
  Administered 2014-03-18 – 2014-03-20 (×2): 12.5 mg via INTRAVENOUS
  Filled 2014-03-18 (×3): qty 1

## 2014-03-18 MED ORDER — EPHEDRINE SULFATE 50 MG/ML IJ SOLN
INTRAMUSCULAR | Status: AC
  Filled 2014-03-18: qty 1

## 2014-03-18 MED ORDER — ONDANSETRON HCL 4 MG/2ML IJ SOLN
4.0000 mg | INTRAMUSCULAR | Status: DC | PRN
  Administered 2014-03-18 – 2014-03-19 (×4): 4 mg via INTRAVENOUS
  Filled 2014-03-18 (×4): qty 2

## 2014-03-18 MED ORDER — GLYCOPYRROLATE 0.2 MG/ML IJ SOLN
INTRAMUSCULAR | Status: DC | PRN
  Administered 2014-03-18: .2 mg via INTRAVENOUS
  Administered 2014-03-18: .4 mg via INTRAVENOUS

## 2014-03-18 MED ORDER — CISATRACURIUM (NIMBEX) ADULT - USE ORDER SET
Status: DC | PRN
  Administered 2014-03-18: 6 mg via BUCCAL
  Administered 2014-03-18: 2 mg via BUCCAL

## 2014-03-18 MED ORDER — PROPOFOL 10 MG/ML IV BOLUS
INTRAVENOUS | Status: AC
  Filled 2014-03-18: qty 20

## 2014-03-18 MED ORDER — LISINOPRIL 5 MG PO TABS
5.0000 mg | ORAL_TABLET | Freq: Every day | ORAL | Status: DC
  Administered 2014-03-18: 5 mg via ORAL
  Filled 2014-03-18 (×2): qty 1

## 2014-03-18 MED ORDER — ESTRADIOL 0.1 MG/GM VA CREA
TOPICAL_CREAM | VAGINAL | Status: DC | PRN
  Administered 2014-03-18: 2 via VAGINAL

## 2014-03-18 MED ORDER — LACTATED RINGERS IV SOLN
INTRAVENOUS | Status: DC | PRN
  Administered 2014-03-18 (×2): via INTRAVENOUS

## 2014-03-18 MED ORDER — SUCCINYLCHOLINE CHLORIDE 20 MG/ML IJ SOLN
INTRAMUSCULAR | Status: DC | PRN
  Administered 2014-03-18: 100 mg via INTRAVENOUS

## 2014-03-18 MED ORDER — PROPOFOL 10 MG/ML IV BOLUS
INTRAVENOUS | Status: DC | PRN
  Administered 2014-03-18: 120 mg via INTRAVENOUS

## 2014-03-18 MED ORDER — POLYMYXIN B SULFATE 500000 UNITS IJ SOLR
INTRAMUSCULAR | Status: AC
  Filled 2014-03-18: qty 1

## 2014-03-18 MED ORDER — HYDROCODONE-ACETAMINOPHEN 5-325 MG PO TABS
1.0000 | ORAL_TABLET | ORAL | Status: DC | PRN
Start: 2014-03-18 — End: 2014-03-20
  Administered 2014-03-19: 2 via ORAL
  Administered 2014-03-19: 1 via ORAL
  Filled 2014-03-18: qty 1
  Filled 2014-03-18: qty 2

## 2014-03-18 MED ORDER — SUFENTANIL CITRATE 50 MCG/ML IV SOLN
INTRAVENOUS | Status: AC
  Filled 2014-03-18: qty 1

## 2014-03-18 MED ORDER — EPHEDRINE SULFATE 50 MG/ML IJ SOLN
INTRAMUSCULAR | Status: DC | PRN
  Administered 2014-03-18: 5 mg via INTRAVENOUS

## 2014-03-18 MED ORDER — STERILE WATER FOR IRRIGATION IR SOLN
Status: DC | PRN
  Administered 2014-03-18: 3000 mL

## 2014-03-18 MED ORDER — SODIUM CHLORIDE 0.9 % IJ SOLN
INTRAMUSCULAR | Status: AC
  Filled 2014-03-18: qty 20

## 2014-03-18 MED ORDER — ACETAMINOPHEN 325 MG PO TABS: 650.0000 mg | ORAL_TABLET | ORAL | Status: DC | PRN

## 2014-03-18 MED ORDER — LIDOCAINE HCL (CARDIAC) 20 MG/ML IV SOLN
INTRAVENOUS | Status: DC | PRN
  Administered 2014-03-18: 80 mg via INTRAVENOUS

## 2014-03-18 MED ORDER — MORPHINE SULFATE 2 MG/ML IJ SOLN
2.0000 mg | INTRAMUSCULAR | Status: DC | PRN
  Administered 2014-03-18 – 2014-03-19 (×4): 2 mg via INTRAVENOUS
  Filled 2014-03-18 (×4): qty 1

## 2014-03-18 MED ORDER — MIDAZOLAM HCL 2 MG/2ML IJ SOLN
INTRAMUSCULAR | Status: AC
  Filled 2014-03-18: qty 2

## 2014-03-18 MED ORDER — MIDAZOLAM HCL 5 MG/5ML IJ SOLN
INTRAMUSCULAR | Status: DC | PRN
  Administered 2014-03-18: 1 mg via INTRAVENOUS

## 2014-03-18 MED FILL — Cisatracurium Besylate (PF) IV Soln 10 MG/5ML (2 MG/ML): INTRAVENOUS | Qty: 15 | Status: AC

## 2014-03-18 MED FILL — Cisatracurium Besylate (PF) IV Soln 10 MG/5ML (2 MG/ML): INTRAVENOUS | Qty: 5 | Status: AC

## 2014-03-18 SURGICAL SUPPLY — 50 items
ALLOGRAFT TUTOPLAST AXIS 6X12 (Tissue) ×1 IMPLANT
BAG URINE DRAINAGE (UROLOGICAL SUPPLIES) ×2 IMPLANT
BLADE HEX COATED 2.75 (ELECTRODE) ×2 IMPLANT
BLADE SURG 15 STRL LF DISP TIS (BLADE) ×2 IMPLANT
BLADE SURG 15 STRL SS (BLADE) ×2
CATH FOLEY 2WAY SLVR  5CC 16FR (CATHETERS) ×1
CATH FOLEY 2WAY SLVR 5CC 16FR (CATHETERS) ×1 IMPLANT
COVER MAYO STAND STRL (DRAPES) ×2 IMPLANT
COVER SURGICAL LIGHT HANDLE (MISCELLANEOUS) ×2 IMPLANT
DEVICE CAPIO SLIM SINGLE (INSTRUMENTS) ×2 IMPLANT
DEVICE CAPIO SUTURING (INSTRUMENTS)
DEVICE CAPIO SUTURING OPC (INSTRUMENTS) IMPLANT
DRAIN PENROSE 18X1/4 LTX STRL (WOUND CARE) ×2 IMPLANT
GAUZE PACKING 2X5 YD STRL (GAUZE/BANDAGES/DRESSINGS) IMPLANT
GAUZE SPONGE 4X4 16PLY XRAY LF (GAUZE/BANDAGES/DRESSINGS) ×6 IMPLANT
GLOVE BIOGEL M STRL SZ7.5 (GLOVE) ×2 IMPLANT
GLOVE ECLIPSE 8.5 STRL (GLOVE) IMPLANT
GOWN STRL REUS W/TWL XL LVL3 (GOWN DISPOSABLE) ×2 IMPLANT
HOLDER FOLEY CATH W/STRAP (MISCELLANEOUS) ×2 IMPLANT
IV NS 1000ML (IV SOLUTION)
IV NS 1000ML BAXH (IV SOLUTION) IMPLANT
KIT BASIN OR (CUSTOM PROCEDURE TRAY) ×2 IMPLANT
NEEDLE HYPO 22GX1.5 SAFETY (NEEDLE) ×2 IMPLANT
NEEDLE MAYO 6 CRC TAPER PT (NEEDLE) ×2 IMPLANT
NS IRRIG 1000ML POUR BTL (IV SOLUTION) ×2 IMPLANT
PACK CYSTO (CUSTOM PROCEDURE TRAY) ×2 IMPLANT
PACKING VAGINAL (PACKING) ×2 IMPLANT
PENCIL BUTTON HOLSTER BLD 10FT (ELECTRODE) ×2 IMPLANT
PLUG CATH AND CAP STER (CATHETERS) ×2 IMPLANT
RETRACTOR STAY HOOK 5MM (MISCELLANEOUS) ×2 IMPLANT
SHEET LAVH (DRAPES) ×2 IMPLANT
SUT CAPIO ETHIBPND (SUTURE) ×4 IMPLANT
SUT VIC AB 0 CT1 27 (SUTURE) ×1
SUT VIC AB 0 CT1 27XBRD ANTBC (SUTURE) ×1 IMPLANT
SUT VIC AB 2-0 CT1 27 (SUTURE) ×2
SUT VIC AB 2-0 CT1 27XBRD (SUTURE) ×2 IMPLANT
SUT VIC AB 2-0 SH 27 (SUTURE) ×4
SUT VIC AB 2-0 SH 27X BRD (SUTURE) ×4 IMPLANT
SUT VIC AB 3-0 PS2 18 (SUTURE)
SUT VIC AB 3-0 PS2 18XBRD (SUTURE) IMPLANT
SUT VIC AB 3-0 SH 27 (SUTURE) ×2
SUT VIC AB 3-0 SH 27XBRD (SUTURE) ×2 IMPLANT
SUT VICRYL 0 UR6 27IN ABS (SUTURE) ×4 IMPLANT
SYRINGE 10CC LL (SYRINGE) ×2 IMPLANT
TOWEL OR 17X26 10 PK STRL BLUE (TOWEL DISPOSABLE) ×2 IMPLANT
TOWEL OR NON WOVEN STRL DISP B (DISPOSABLE) ×2 IMPLANT
TUBING CONNECTING 10 (TUBING) ×2 IMPLANT
TUTOPLAST AXIS 6X12 (Tissue) ×2 IMPLANT
WATER STERILE IRR 1500ML POUR (IV SOLUTION) IMPLANT
YANKAUER SUCT BULB TIP 10FT TU (MISCELLANEOUS) ×2 IMPLANT

## 2014-03-18 NOTE — Progress Notes (Signed)
Nausea but no vomiting meds changed to pherergan Belly soft Minimal pain Vitals normal Reviewed op withpt

## 2014-03-18 NOTE — Op Note (Signed)
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 Nicki Reaper Damion Kant Assistant: Debbrah Alar  The patient has the above diagnoses and consented to the above procedure. Extra care taken with leg positioning to minimize the risk of compartment syndrome and neuropathy and deep vein thrombosis. Under anesthesia she had a modest grade 3 cystocele that just reached the introitus. Vaginal cuff dissended fiber 6 cm and she had a grade 2 rectocele. A 3-0 Vicryl suture was placed at the apex.  Instilled 20 cc of a lidocaine epinephrine mixture. I used my 3 Allis clamps and made a long anterior vaginal wall incision in a T-shaped. I did not encroach upon the bladder neck. I mobilized the pubocervical fascia from the overlying vaginal mucosa sharply and bluntly to the white line bilaterally and was very pleased with my apical mobilization. I did a 2 layer repair with running 2-0 Vicryl in an anterior repair fashion not imbricating the bladder neck  I then cystoscoped the patient and cystoscopically she had excellent repair. She no distortion of the ureters. She excellent yellow jets bilaterally  I finger dissected to the initial spine bilaterally. I mobilized soft tissue medially. I placed a 0 Ethibond with a Capua device 1 full finger breath yielded the spine in a straight line between the spines  With my usual technique I placed a 0 Vicryl with a UR 6 needle into the pelvic sidewall the urethrovesical angle.  I tailored a 10 x 6 graft in the shape of a trapezoid. It was laid in tension-free.  I trimmed an appropriate amount of anterior vaginal wall and closed the anterior vaginal wall running 2-0 Vicryl on a CT1 needle.  On inspection rectal examination I had checked ureteral sacral ligaments and there was no injury to  rectum. A further rectal examination demonstrated the rectocele that needed repair  She had some distortion of the hymenal ring with one more cephalad than the other. She had a very wide posterior fourchette. Allis clamps were utilized and I removed a small triangle perineal skin. I instilled 20 cc of a lidocaine epinephrine mixture. I made a long posterior vaginal wall incision with my usual technique and sharply and bluntly mobilized the underlying rectovaginal fascia from the overlying vaginal wall mucosa to the pelvic sidewall bilaterally. I mobilized nicely at the apex.   I repeated my rectal examination and she had diffuse thinning of the rectovaginal fascia. 2 layer closure was utilized. Rectal examination checked for a good repair. I trimmed a minimal amount of vaginal wall mucosa and closed the posterior vaginal wall with running 2-0 Vicryl on a CT1 needle. With 0 Vicryl sutures I did a reapproximation of the perineal body or rebuilding of the perineal body.  I then exteriorized and closed the perineum. I reapproximated the anatomy in an anatomic fashion with the abnormality noted above  Blood loss was less than 100 mL. She  excellent vaginal length and volume. Vaginal pack with Estrace cream x2 was utilized. Leg position was excellent. Urine output was excellent. Patient taken to recover room and hopefully she will reach her treatment goal

## 2014-03-18 NOTE — Transfer of Care (Signed)
Immediate Anesthesia Transfer of Care Note  Patient: Alison Cuevas  Procedure(s) Performed: Procedure(s): CYSTO (N/A) Cystocele repair with graft, Vault suspension, Rectocele repair (N/A)  Patient Location: PACU  Anesthesia Type:General  Level of Consciousness: awake, oriented and sedated  Airway & Oxygen Therapy: Patient Spontanous Breathing and Patient connected to face mask oxygen  Post-op Assessment: Report given to PACU RN and Post -op Vital signs reviewed and stable  Post vital signs: Reviewed and stable  Complications: No apparent anesthesia complications

## 2014-03-18 NOTE — Interval H&P Note (Signed)
History and Physical Interval Note:  03/18/2014 7:12 AM  Alison Cuevas  has presented today for surgery, with the diagnosis of CYSTOCELEE,RECTOCELLE,VAULT PROLAPSE  The various methods of treatment have been discussed with the patient and family. After consideration of risks, benefits and other options for treatment, the patient has consented to  Procedure(s): CYSTO (N/A) ANTERIOR (CYSTOCELE) AND POSTERIOR REPAIR (RECTOCELE)REPAIR VAULT PROLAPSE AND GRAFT (N/A) as a surgical intervention .  The patient's history has been reviewed, patient examined, no change in status, stable for surgery.  I have reviewed the patient's chart and labs.  Questions were answered to the patient's satisfaction.     [name] A

## 2014-03-18 NOTE — Anesthesia Postprocedure Evaluation (Signed)
  Anesthesia Post-op Note  Patient: Alison Cuevas  Procedure(s) Performed: Procedure(s) (LRB): CYSTO (N/A) Cystocele repair with graft, Vault suspension, Rectocele repair (N/A)  Patient Location: PACU  Anesthesia Type: General  Level of Consciousness: awake and alert   Airway and Oxygen Therapy: Patient Spontanous Breathing  Post-op Pain: mild  Post-op Assessment: Post-op Vital signs reviewed, Patient's Cardiovascular Status Stable, Respiratory Function Stable, Patent Airway and No signs of Nausea or vomiting  Last Vitals:  Filed Vitals:   03/18/14 1102  BP: 110/67  Pulse: 85  Temp: 36.3 C  Resp: 12    Post-op Vital Signs: stable   Complications: No apparent anesthesia complications

## 2014-03-18 NOTE — Anesthesia Preprocedure Evaluation (Addendum)
Anesthesia Evaluation  Patient identified by MRN, date of birth, ID band Patient awake    Reviewed: Allergy & Precautions, H&P , NPO status , Patient's Chart, lab work & pertinent test results  Airway Mallampati: II TM Distance: >3 FB Neck ROM: full    Dental no notable dental hx. (+) Teeth Intact, Dental Advisory Given   Pulmonary neg pulmonary ROS,  breath sounds clear to auscultation  Pulmonary exam normal       Cardiovascular Exercise Tolerance: Good hypertension, Pt. on medications Rhythm:regular Rate:Normal     Neuro/Psych negative neurological ROS  negative psych ROS   GI/Hepatic negative GI ROS, Neg liver ROS, GERD-  ,  Endo/Other  negative endocrine ROS  Renal/GU negative Renal ROS  negative genitourinary   Musculoskeletal   Abdominal   Peds  Hematology negative hematology ROS (+)   Anesthesia Other Findings   Reproductive/Obstetrics negative OB ROS                         Anesthesia Physical Anesthesia Plan  ASA: III  Anesthesia Plan: General   Post-op Pain Management:    Induction: Intravenous  Airway Management Planned: Oral ETT  Additional Equipment:   Intra-op Plan:   Post-operative Plan: Extubation in OR  Informed Consent: I have reviewed the patients History and Physical, chart, labs and discussed the procedure including the risks, benefits and alternatives for the proposed anesthesia with the patient or authorized representative who has indicated his/her understanding and acceptance.   Dental Advisory Given  Plan Discussed with: CRNA and Surgeon  Anesthesia Plan Comments:         Anesthesia Quick Evaluation

## 2014-03-19 ENCOUNTER — Encounter (HOSPITAL_COMMUNITY): Payer: Self-pay | Admitting: Urology

## 2014-03-19 DIAGNOSIS — F3289 Other specified depressive episodes: Secondary | ICD-10-CM | POA: Diagnosis not present

## 2014-03-19 DIAGNOSIS — F411 Generalized anxiety disorder: Secondary | ICD-10-CM | POA: Diagnosis not present

## 2014-03-19 DIAGNOSIS — I10 Essential (primary) hypertension: Secondary | ICD-10-CM | POA: Diagnosis not present

## 2014-03-19 DIAGNOSIS — E7800 Pure hypercholesterolemia, unspecified: Secondary | ICD-10-CM | POA: Diagnosis not present

## 2014-03-19 DIAGNOSIS — M129 Arthropathy, unspecified: Secondary | ICD-10-CM | POA: Diagnosis not present

## 2014-03-19 DIAGNOSIS — N993 Prolapse of vaginal vault after hysterectomy: Secondary | ICD-10-CM | POA: Diagnosis not present

## 2014-03-19 DIAGNOSIS — F329 Major depressive disorder, single episode, unspecified: Secondary | ICD-10-CM | POA: Diagnosis not present

## 2014-03-19 LAB — BASIC METABOLIC PANEL
Anion gap: 8 (ref 5–15)
BUN: 8 mg/dL (ref 6–23)
CO2: 26 mEq/L (ref 19–32)
Calcium: 8.5 mg/dL (ref 8.4–10.5)
Chloride: 101 mEq/L (ref 96–112)
Creatinine, Ser: 0.77 mg/dL (ref 0.50–1.10)
GFR calc Af Amer: >90 mL/min (ref >90)
GFR calc non Af Amer: 84 mL/min — ABNORMAL LOW (ref >90)
Glucose, Bld: 130 mg/dL — ABNORMAL HIGH (ref 70–99)
Potassium: 3.6 mEq/L — ABNORMAL LOW (ref 3.7–5.3)
Sodium: 135 mEq/L — ABNORMAL LOW (ref 137–147)

## 2014-03-19 LAB — HEMOGLOBIN AND HEMATOCRIT, BLOOD
HCT: 32.7 % — ABNORMAL LOW (ref 36.0–46.0)
Hemoglobin: 11.1 g/dL — ABNORMAL LOW (ref 12.0–15.0)

## 2014-03-19 LAB — GLUCOSE, CAPILLARY: Glucose-Capillary: 127 mg/dL — ABNORMAL HIGH (ref 70–99)

## 2014-03-19 MED ORDER — FLUTICASONE PROPIONATE 50 MCG/ACT NA SUSP
1.0000 | Freq: Every day | NASAL | Status: DC
  Administered 2014-03-20: 1 via NASAL
  Filled 2014-03-19: qty 16

## 2014-03-19 NOTE — Progress Notes (Signed)
Vitals and labs normal Light headed with standing but nurse believes likely pain meds Mild nausea with standing not with supine Belly benign Vaginal sore/no pain otherwise Discharge instructions and observation today til noon or so discussed

## 2014-03-19 NOTE — Discharge Summary (Signed)
Date of admission: 03/18/2014  Date of discharge: 03/19/2014  Admission diagnosis: Vault prolapse  Discharge diagnosis: Vault prolapse   Secondary diagnoses: Cystocele and rectocelel  History and Physical: For full details, please see admission history and physical. Briefly, Alison Cuevas is a 68 y.o. year old patient with above diagnosis.   Hospital Course: Prolapse surgery went well; post op nausea settled; post op dizzness with standing observed  Laboratory values:  Recent Labs  03/18/14 1545 03/19/14 0349  HGB 11.4* 11.1*  HCT 32.0* 32.7*    Recent Labs  03/19/14 0349  CREATININE 0.77    Disposition: Home  Discharge instruction: The patient was instructed to be ambulatory but told to refrain from heavy lifting, strenuous activity, or driving. Reviewed in detail  Discharge medications:    Medication List    STOP taking these medications       aspirin EC 81 MG tablet     BIOTIN PO     CALTRATE 600+D 600-400 MG-UNIT per tablet  Generic drug:  Calcium Carbonate-Vitamin D     conjugated estrogens vaginal cream  Commonly known as:  PREMARIN     Cranberry 1000 MG Caps     meloxicam 15 MG tablet  Commonly known as:  MOBIC     multivitamin with minerals Tabs tablet     SUPER B COMPLEX PO     vitamin B-12 1000 MCG tablet  Commonly known as:  CYANOCOBALAMIN     Vitamin D 2000 UNITS Caps     vitamin E 400 UNIT capsule      TAKE these medications       ALPRAZolam 0.5 MG tablet  Commonly known as:  XANAX  Take 0.5 mg by mouth 3 (three) times daily as needed for anxiety.     docusate sodium 100 MG capsule  Commonly known as:  COLACE  Take 1 capsule (100 mg total) by mouth 2 (two) times daily.     fluticasone 50 MCG/ACT nasal spray  Commonly known as:  FLONASE  Place 2 sprays into both nostrils at bedtime.     guaiFENesin 600 MG 12 hr tablet  Commonly known as:  MUCINEX  Take 1,200 mg by mouth 2 (two) times daily as needed for cough or to loosen  phlegm.     HYDROcodone-acetaminophen 5-325 MG per tablet  Commonly known as:  NORCO/VICODIN  Take 1-2 tablets by mouth every 6 (six) hours as needed for moderate pain.     lisinopril 10 MG tablet  Commonly known as:  PRINIVIL,ZESTRIL  Take 5 mg by mouth at bedtime. If b/p greater than 140/90 patient takes 10 mg po     verapamil 180 MG CR tablet  Commonly known as:  CALAN-SR  Take 180 mg by mouth at bedtime.        Followup:      Follow-up Information   Follow up with Stellarose Cerny A, MD. (office will call you with date and time of appt. )    Specialty:  Urology   Contact information:   [address] [place] [address] [phone]       Follow up with Yeudiel Mateo A, MD. (as scheduled)    Specialty:  Urology   Contact information:   [place] [address] [phone]

## 2014-03-19 NOTE — Progress Notes (Signed)
Pt in hall ambulating with NT c/o feeling dizzy and "things going black" Pt back to room and placed in bed. Pt minimally responsive opens eyes to first name called. BP 85/41 pulse 65 Resp 12. NP called

## 2014-03-19 NOTE — Progress Notes (Signed)
Pt reports feeling better nausea resolved at this time along with the c/o dizziness. VS 96/47 pulse 71 resp 16. Bladder scan done with results of 228ml noted. Will continue to moniter Pt closely.

## 2014-03-19 NOTE — Progress Notes (Signed)
Pt now lying in bed more responsive "I just feel bad dizzy and my stomach feels sick" New orders given by Jimmey Ralph NP

## 2014-03-19 NOTE — Progress Notes (Signed)
Pt able to sit on side at bed and then up to [facility] to try to void. Tolerated fair with c/o nausea and unable to void at this time.

## 2014-03-19 NOTE — Progress Notes (Signed)
1 Day Post-Op Subjective: Feeling better this afternoon. Up and ambulated x 2 after syncopal episode. Vitals improving. Voided x 1 for 100cc. Bladder scan showed >372mL. Voided another 324mL but bladder scan still showed >399. Foley was replaced and returned 300cc. Having some nausea. Not tolerating great PO today.  Objective: Vital signs in last 24 hours: Temp:  [98.4 F (36.9 C)-99.3 F (37.4 C)] 99.1 F (37.3 C) (09/23 1419) Pulse Rate:  [65-101] 78 (09/23 1419) Resp:  [16-20] 16 (09/23 1055) BP: (82-140)/(45-56) 115/51 mmHg (09/23 1419) SpO2:  [95 %-100 %] 97 % (09/23 1419)  Intake/Output from previous day: 09/22 0701 - 09/23 0700 In: 3697.1 [P.O.:120; I.V.:3577.1] Out: 1287 [Urine:3045] Intake/Output this shift: Total I/O In: 240 [P.O.:240] Out: 1150 [Urine:1150]  Physical Exam:  General: Alert and oriented CV: RRR Lungs: Clear Abdomen: Soft, ND Incisions: Ext: NT, No erythema  Lab Results:  Recent Labs  03/18/14 1545 03/19/14 0349  HGB 11.4* 11.1*  HCT 32.0* 32.7*   BMET  Recent Labs  03/19/14 0349  NA 135*  K 3.6*  CL 101  CO2 26  GLUCOSE 130*  BUN 8  CREATININE 0.77  CALCIUM 8.5     Studies/Results: No results found.  Assessment/Plan: POD#1 cystocele/rectocele repair. Had syncopal episode this am, likely from vasovagal reaction. Doing better this afternoon after bolus, resumption of fluids, and avoiding pain meds/benzos and antihypertensives. Unable to completely empty bladder. Voided 312mL with 300 residual so foley replaced.   -- avoid antihypertensives -- fluids overnight and encourage PO -- encourage ambulation and oob to chair -- d/c with foley. Can likely remove in clinic on Friday -- plan to D/C tomorrow if improves.    LOS: 1 day   Alison Cuevas C 03/19/2014, 5:45 PM

## 2014-03-19 NOTE — Progress Notes (Signed)
Pt voided 300cc bladder scan showed 405. Per orders Foley catheter  Inserted and with return 300 yellow urine Pt tolerated procedure without difficulty.

## 2014-03-20 DIAGNOSIS — F3289 Other specified depressive episodes: Secondary | ICD-10-CM | POA: Diagnosis not present

## 2014-03-20 DIAGNOSIS — M129 Arthropathy, unspecified: Secondary | ICD-10-CM | POA: Diagnosis not present

## 2014-03-20 DIAGNOSIS — F329 Major depressive disorder, single episode, unspecified: Secondary | ICD-10-CM | POA: Diagnosis not present

## 2014-03-20 DIAGNOSIS — E7800 Pure hypercholesterolemia, unspecified: Secondary | ICD-10-CM | POA: Diagnosis not present

## 2014-03-20 DIAGNOSIS — I10 Essential (primary) hypertension: Secondary | ICD-10-CM | POA: Diagnosis not present

## 2014-03-20 DIAGNOSIS — F411 Generalized anxiety disorder: Secondary | ICD-10-CM | POA: Diagnosis not present

## 2014-03-20 DIAGNOSIS — N993 Prolapse of vaginal vault after hysterectomy: Secondary | ICD-10-CM | POA: Diagnosis not present

## 2014-03-20 MED ORDER — ONDANSETRON 4 MG PO TBDP
4.0000 mg | ORAL_TABLET | Freq: Three times a day (TID) | ORAL | Status: DC | PRN
  Filled 2014-03-20: qty 1

## 2014-03-20 MED ORDER — ONDANSETRON 4 MG PO TBDP: 4.0000 mg | ORAL_TABLET | Freq: Three times a day (TID) | ORAL | Status: DC | PRN

## 2014-03-20 NOTE — Discharge Instructions (Signed)
As discussed with Dr. Kaylyn Layer may resume aspirin, mobic, advil, aleve, vitamins, and supplements 7 days after surgery. I have reviewed discharge instructions in detail with the patient. They will follow-up with me or their physician as scheduled. My nurse will also be calling the patients as per protocol.   Your foley catheter will be removed by the nurse in clinic on Monday 9/28.

## 2014-03-20 NOTE — Discharge Summary (Signed)
Physician Discharge Summary  Patient ID: Alison Cuevas MRN: [id number] DOB/AGE: [id number] 69 y.o.  Admit date: 03/18/2014 Discharge date: 03/20/2014  Admission Diagnoses:  Discharge Diagnoses:  Active Problems:   Cystocele   Discharged Condition: stable  Hospital Course: Patient underwent cystocele and rectocele repair that went well. Had syncopal episode on POD#1 so observed overnight until POD#2. Her foley was replaced with 38mL residual. She did well and by POD#2 was able to get up out of bed, ambulate with assistance, and tolerated adequate PO intake. Her pain was reasonably well controlled and the patient and family were comfortable with discharge.  Consults: None  Discharge Exam: Blood pressure 136/55, pulse 76, temperature 98.3 F (36.8 C), temperature source Oral, resp. rate 18, height 5\' 1"  (1.549 m), weight 65 kg (143 lb 4.8 oz), last menstrual period 06/27/1972, SpO2 96.00%. AAOx3, normal WOB. Abdomen soft, NT/ND. Foley clear yellow. Extremities WWP, no edema   Disposition: 01-Home or Self Care     Medication List    STOP taking these medications       aspirin EC 81 MG tablet     BIOTIN PO     CALTRATE 600+D 600-400 MG-UNIT per tablet  Generic drug:  Calcium Carbonate-Vitamin D     conjugated estrogens vaginal cream  Commonly known as:  PREMARIN     Cranberry 1000 MG Caps     meloxicam 15 MG tablet  Commonly known as:  MOBIC     multivitamin with minerals Tabs tablet     SUPER B COMPLEX PO     vitamin B-12 1000 MCG tablet  Commonly known as:  CYANOCOBALAMIN     Vitamin D 2000 UNITS Caps     vitamin E 400 UNIT capsule      TAKE these medications       ALPRAZolam 0.5 MG tablet  Commonly known as:  XANAX  Take 0.5 mg by mouth 3 (three) times daily as needed for anxiety.     docusate sodium 100 MG capsule  Commonly known as:  COLACE  Take 1 capsule (100 mg total) by mouth 2 (two) times daily.     fluticasone 50 MCG/ACT nasal spray   Commonly known as:  FLONASE  Place 2 sprays into both nostrils at bedtime.     guaiFENesin 600 MG 12 hr tablet  Commonly known as:  MUCINEX  Take 1,200 mg by mouth 2 (two) times daily as needed for cough or to loosen phlegm.     HYDROcodone-acetaminophen 5-325 MG per tablet  Commonly known as:  NORCO/VICODIN  Take 1-2 tablets by mouth every 6 (six) hours as needed for moderate pain.     lisinopril 10 MG tablet  Commonly known as:  PRINIVIL,ZESTRIL  Take 5 mg by mouth at bedtime. If b/p greater than 140/90 patient takes 10 mg po     ondansetron 4 MG disintegrating tablet  Commonly known as:  ZOFRAN-ODT  Take 1 tablet (4 mg total) by mouth every 8 (eight) hours as needed for nausea or vomiting.     verapamil 180 MG CR tablet  Commonly known as:  CALAN-SR  Take 180 mg by mouth at bedtime.           Follow-up Information   Follow up with MACDIARMID,SCOTT A, MD. (office will call you with date and time of appt. )    Specialty:  Urology   Contact information:   [place] [address] [phone]       Follow up  with MACDIARMID,SCOTT A, MD. (as scheduled)    Specialty:  Urology   Contact information:   [place] [address] [phone]       Signed: Milon Score 03/20/2014, 12:28 PM

## 2014-03-20 NOTE — Progress Notes (Signed)
2 Days Post-Op Subjective: Feeling better this morning. Foley draining well with good output. No more dizziness or weakness. No more syncopal episodes. OOB to chair. Tolerating liquids and soft food. Minimal solid PO intake because she doesn't like the food.  Objective: Vital signs in last 24 hours: Temp:  [98.2 F (36.8 C)-99.5 F (37.5 C)] 98.2 F (36.8 C) (09/24 4562) Pulse Rate:  [65-91] 81 (09/24 0613) Resp:  [16-18] 18 (09/24 0613) BP: (82-134)/(45-55) 128/55 mmHg (09/24 0613) SpO2:  [95 %-98 %] 98 % (09/24 0613)  Intake/Output from previous day: 09/23 0701 - 09/24 0700 In: 1835 [P.O.:460; I.V.:1375] Out: 4100 [Urine:4100] Intake/Output this shift:    Physical Exam:  General: Alert and oriented CV: RRR Lungs: Clear Abdomen: Soft, ND Ext: NT, No erythema  Lab Results:  Recent Labs  03/18/14 1545 03/19/14 0349  HGB 11.4* 11.1*  HCT 32.0* 32.7*   BMET  Recent Labs  03/19/14 0349  NA 135*  K 3.6*  CL 101  CO2 26  GLUCOSE 130*  BUN 8  CREATININE 0.77  CALCIUM 8.5     Studies/Results: No results found.  Assessment/Plan: POD#1 cystocele/rectocele repair. Had syncopal episode this am, likely from vasovagal reaction. Doing better this afternoon after bolus, resumption of fluids, and avoiding pain meds/benzos and antihypertensives. Unable to completely empty bladder. Voided 353mL with 300 residual so foley replaced.   -- avoid antihypertensives -- Medlock -- encourage ambulation and oob to chair -- d/c with foley. Will discuss with Dr. Matilde Sprang when to return for TOV -- plan to D/C this afternoon    LOS: 2 days   Alison Cuevas C 03/20/2014, 8:29 AM

## 2014-03-24 ENCOUNTER — Telehealth: Payer: Self-pay | Admitting: Family Medicine

## 2014-03-24 DIAGNOSIS — R350 Frequency of micturition: Secondary | ICD-10-CM | POA: Diagnosis not present

## 2014-03-24 DIAGNOSIS — N3941 Urge incontinence: Secondary | ICD-10-CM | POA: Diagnosis not present

## 2014-03-24 DIAGNOSIS — N8111 Cystocele, midline: Secondary | ICD-10-CM | POA: Diagnosis not present

## 2014-03-24 NOTE — Telephone Encounter (Signed)
No, she may take a rx like this if it is given for a short term ACUTE pain problem.-thx

## 2014-03-24 NOTE — Telephone Encounter (Signed)
Patient is inquiring about Rx for Hydrocodone that she received from the hospital. Does she need to bring it to our office since she signed a contract?

## 2014-03-24 NOTE — Telephone Encounter (Signed)
Please advise 

## 2014-03-25 NOTE — Telephone Encounter (Signed)
Patient aware.

## 2014-03-26 ENCOUNTER — Ambulatory Visit: Payer: Medicare Other | Admitting: Family Medicine

## 2014-04-01 DIAGNOSIS — N8111 Cystocele, midline: Secondary | ICD-10-CM | POA: Diagnosis not present

## 2014-04-01 DIAGNOSIS — N816 Rectocele: Secondary | ICD-10-CM | POA: Diagnosis not present

## 2014-04-14 ENCOUNTER — Telehealth: Payer: Self-pay | Admitting: Family Medicine

## 2014-04-14 MED ORDER — HYDROCODONE-ACETAMINOPHEN 5-325 MG PO TABS: ORAL_TABLET | ORAL | Status: DC

## 2014-04-14 NOTE — Telephone Encounter (Signed)
Vicodin rx printed. 

## 2014-04-14 NOTE — Telephone Encounter (Signed)
Pt reqesting rf of hydrocodone.  Last OV was 12/25/13.  Last RX was printed 03/18/14.  Please advise rf.

## 2014-04-15 NOTE — Telephone Encounter (Signed)
Patient aware that rx is ready for p/u.  Her son will come get it for her.

## 2014-04-24 DIAGNOSIS — R300 Dysuria: Secondary | ICD-10-CM | POA: Diagnosis not present

## 2014-04-24 DIAGNOSIS — N390 Urinary tract infection, site not specified: Secondary | ICD-10-CM | POA: Diagnosis not present

## 2014-04-28 ENCOUNTER — Encounter (HOSPITAL_COMMUNITY): Payer: Self-pay | Admitting: Urology

## 2014-05-28 ENCOUNTER — Other Ambulatory Visit: Payer: Self-pay | Admitting: Pulmonary Disease

## 2014-06-02 ENCOUNTER — Other Ambulatory Visit: Payer: Self-pay | Admitting: Pulmonary Disease

## 2014-06-04 ENCOUNTER — Other Ambulatory Visit: Payer: Self-pay | Admitting: Family Medicine

## 2014-06-04 MED ORDER — VERAPAMIL HCL ER 180 MG PO TBCR: 180.0000 mg | EXTENDED_RELEASE_TABLET | Freq: Every day | ORAL | Status: DC

## 2014-06-07 ENCOUNTER — Other Ambulatory Visit: Payer: Self-pay | Admitting: Pulmonary Disease

## 2014-06-09 DIAGNOSIS — N8111 Cystocele, midline: Secondary | ICD-10-CM | POA: Diagnosis not present

## 2014-06-09 DIAGNOSIS — N816 Rectocele: Secondary | ICD-10-CM | POA: Diagnosis not present

## 2014-06-13 ENCOUNTER — Encounter: Payer: Self-pay | Admitting: Gynecology

## 2014-06-13 ENCOUNTER — Ambulatory Visit (INDEPENDENT_AMBULATORY_CARE_PROVIDER_SITE_OTHER): Payer: Medicare Other | Admitting: Gynecology

## 2014-06-13 ENCOUNTER — Ambulatory Visit (HOSPITAL_COMMUNITY): Payer: Medicare Other

## 2014-06-13 VITALS — BP 120/66 | Ht 61.0 in | Wt 140.0 lb

## 2014-06-13 DIAGNOSIS — N952 Postmenopausal atrophic vaginitis: Secondary | ICD-10-CM

## 2014-06-13 DIAGNOSIS — M8580 Other specified disorders of bone density and structure, unspecified site: Secondary | ICD-10-CM | POA: Diagnosis not present

## 2014-06-13 NOTE — Progress Notes (Signed)
Alison Cuevas [birth year] [id number]        68 y.o.  G4P0013 for Follow up exam. Recently had vault prolapse repair and cystocele repair and graft rectocele repair and cystoscopy by Dr. Bernerd Limbo  Past medical history,surgical history, problem list, medications, allergies, family history and social history were all reviewed and documented as reviewed in the EPIC chart.  ROS:  Performed with pertinent positives and negatives included in the history, assessment and plan.   Additional significant findings :  none   Exam: [name] Counsellor Vitals:   06/13/14 0938  BP: 120/66  Height: 5\' 1"  (1.549 m)  Weight: 140 lb (63.504 kg)   General appearance:  Normal affect, orientation and appearance. Skin: Grossly normal HEENT: Without gross lesions.  No cervical or supraclavicular adenopathy. Thyroid normal.  Lungs:  Clear without wheezing, rales or rhonchi Cardiac: RR, without RMG Abdominal:  Soft, nontender, without masses, guarding, rebound, organomegaly or hernia Breasts:  Examined lying and sitting without masses, retractions, discharge or axillary adenopathy. Pelvic:  Ext/BUS/vagina with generalized atrophic changes. Good cuff support. Anterior and posterior vaginal wall supported well with graft palpated through the anterior vaginal mucosa.  Adnexa  Without masses or tenderness    Anus and perineum  Normal   Rectovaginal  Normal sphincter tone without palpated masses or tenderness.    Assessment/Plan:  68 y.o. [phone] female for follow up exam..   1. Recent vault prolapse repair and cystocele repair and graft rectocele repair and cystoscopy. Results look good with good support throughout. Does have generalized atrophic changes.  Patient asked about reinitiating vaginal estrogen. Discussed that if she starts to have symptoms of vaginal dryness she can do this but to be very gentle inserting the applicator. Patients can wait and see if she develops symptoms before starting. 2. History of  osteopenia with prior DEXA -1.2. Most recent DEXA 2011 normal. Will plan repeat next year at five-year interval. Increased calcium vitamin D reviewed. 3. Mammography do now and patient knows to schedule. SBE monthly reviewed. 4. Pap smear 2013. No Pap smear done today. Options to stop screening altogether she is over the age of 51 and status post hysterectomy for benign indications reviewed. Will readdress on an annual basis. 5. Colonoscopy 6 years ago with planned repeat interval 10 years per her history. 6. Health maintenance. No routine lab work done as this is done through her primary physician's office. Follow up in one year, sooner as needed.     [name] MD, 10:22 AM 06/13/2014

## 2014-06-13 NOTE — Patient Instructions (Signed)
Follow up in one year, sooner if any issues 

## 2014-06-16 ENCOUNTER — Encounter: Payer: Self-pay | Admitting: Family Medicine

## 2014-06-16 ENCOUNTER — Ambulatory Visit (HOSPITAL_COMMUNITY): Payer: Medicare Other

## 2014-06-16 ENCOUNTER — Ambulatory Visit (INDEPENDENT_AMBULATORY_CARE_PROVIDER_SITE_OTHER): Payer: Medicare Other | Admitting: Family Medicine

## 2014-06-16 VITALS — BP 148/83 | HR 60 | Temp 97.9°F | Resp 18 | Ht 61.0 in | Wt 134.0 lb

## 2014-06-16 DIAGNOSIS — K5900 Constipation, unspecified: Secondary | ICD-10-CM

## 2014-06-16 DIAGNOSIS — E7800 Pure hypercholesterolemia, unspecified: Secondary | ICD-10-CM

## 2014-06-16 DIAGNOSIS — R5383 Other fatigue: Secondary | ICD-10-CM

## 2014-06-16 DIAGNOSIS — J018 Other acute sinusitis: Secondary | ICD-10-CM | POA: Diagnosis not present

## 2014-06-16 DIAGNOSIS — I10 Essential (primary) hypertension: Secondary | ICD-10-CM | POA: Diagnosis not present

## 2014-06-16 DIAGNOSIS — J3089 Other allergic rhinitis: Secondary | ICD-10-CM

## 2014-06-16 DIAGNOSIS — K5909 Other constipation: Secondary | ICD-10-CM

## 2014-06-16 LAB — LIPID PANEL
Cholesterol: 191 mg/dL (ref 0–200)
HDL: 53.7 mg/dL (ref >39.00)
LDL Cholesterol: 126 mg/dL — ABNORMAL HIGH (ref 0–99)
NonHDL: 137.3
Total CHOL/HDL Ratio: 4
Triglycerides: 59 mg/dL (ref 0.0–149.0)
VLDL: 11.8 mg/dL (ref 0.0–40.0)

## 2014-06-16 LAB — COMPREHENSIVE METABOLIC PANEL
ALT: 22 U/L (ref 0–35)
AST: 24 U/L (ref 0–37)
Albumin: 4.2 g/dL (ref 3.5–5.2)
Alkaline Phosphatase: 66 U/L (ref 39–117)
BUN: 11 mg/dL (ref 6–23)
CO2: 29 mEq/L (ref 19–32)
Calcium: 9.3 mg/dL (ref 8.4–10.5)
Chloride: 102 mEq/L (ref 96–112)
Creatinine, Ser: 0.8 mg/dL (ref 0.4–1.2)
GFR: 78.83 mL/min (ref >60.00)
Glucose, Bld: 89 mg/dL (ref 70–99)
Potassium: 5 mEq/L (ref 3.5–5.1)
Sodium: 138 mEq/L (ref 135–145)
Total Bilirubin: 0.9 mg/dL (ref 0.2–1.2)
Total Protein: 6.7 g/dL (ref 6.0–8.3)

## 2014-06-16 MED ORDER — FEXOFENADINE HCL 60 MG PO TABS: 60.0000 mg | ORAL_TABLET | Freq: Two times a day (BID) | ORAL | Status: DC

## 2014-06-16 MED ORDER — AMOXICILLIN 875 MG PO TABS: 875.0000 mg | ORAL_TABLET | Freq: Two times a day (BID) | ORAL | Status: DC

## 2014-06-16 NOTE — Progress Notes (Signed)
Pre visit review using our clinic review tool, if applicable. No additional management support is needed unless otherwise documented below in the visit note. 

## 2014-06-16 NOTE — Progress Notes (Addendum)
OFFICE NOTE  06/16/2014  CC:  Chief Complaint  Patient presents with  . Follow-up    fasting     HPI: Patient is a 68 y.o. Caucasian female who is here for 5 mo f/u HTN, hyperlipidemia, GERD, chronic LBP. Had urologic surgery 03/18/14, doing ok. Has had off/on sinus/nasal congestion and drainage for months, pain in sinus regions and around eyes, swimmy headed at times, takes flonase regularly and saline nasal spray.  No ST, no cough.  No fevers.  Has constipation, has even stopped taking narcotic pain med for her low back pain to see if this would help.  Takes meloxicam for LBP now. She has one bm daily but it is hard stool and difficult to evacuate most times.  Pertinent PMH:  Past medical, surgical, social, and family history reviewed and no changes are noted since last office visit.  MEDS:  Outpatient Prescriptions Prior to Visit  Medication Sig Dispense Refill  . ALPRAZolam (XANAX) 0.5 MG tablet Take 0.5 mg by mouth 3 (three) times daily as needed for anxiety.    . docusate sodium (COLACE) 100 MG capsule Take 1 capsule (100 mg total) by mouth 2 (two) times daily. 20 capsule 0  . fluticasone (FLONASE) 50 MCG/ACT nasal spray Place 2 sprays into both nostrils at bedtime.    Marland Kitchen guaiFENesin (MUCINEX) 600 MG 12 hr tablet Take 1,200 mg by mouth 2 (two) times daily as needed for cough or to loosen phlegm.     Marland Kitchen HYDROcodone-acetaminophen (NORCO/VICODIN) 5-325 MG per tablet 1 tab po tid prn pain 90 tablet 0  . lisinopril (PRINIVIL,ZESTRIL) 10 MG tablet Take 5 mg by mouth at bedtime. If b/p greater than 140/90 patient takes 10 mg po    . verapamil (CALAN-SR) 180 MG CR tablet Take 1 tablet (180 mg total) by mouth at bedtime. 90 tablet 1   No facility-administered medications prior to visit.    PE: Blood pressure 148/83, pulse 60, temperature 97.9 F (36.6 C), temperature source Temporal, resp. rate 18, height 5\' 1"  (1.549 m), weight 134 lb (60.782 kg), last menstrual period 06/27/1972,  SpO2 97 %. VS: noted--normal. Gen: alert, NAD, NONTOXIC APPEARING. HEENT: eyes without injection, drainage, or swelling.  Ears: EACs clear, TMs with normal light reflex and landmarks.  Nose: Clear rhinorrhea, with some dried, crusty exudate adherent to mildly injected mucosa.  No purulent d/c.  Mild paranasal sinus pressure to palpation.  No facial swelling.  Throat and mouth without focal lesion.  No pharyngial swelling, erythema, or exudate.   Neck: supple, no LAD.   LUNGS: CTA bilat, nonlabored resps.   CV: RRR, no m/r/g. EXT: no c/c/e SKIN: no rash   LABS: none today RECENT:  Lab Results  Component Value Date   CHOL 187 12/12/2012   HDL 48.50 12/12/2012   LDLCALC 123* 12/12/2012   LDLDIRECT 136.9 09/20/2007   TRIG 80.0 12/12/2012   CHOLHDL 4 12/12/2012   Lab Results  Component Value Date   WBC 6.3 03/14/2014   HGB 11.1* 03/19/2014   HCT 32.7* 03/19/2014   MCV 86.1 03/14/2014   PLT 176 03/14/2014     Chemistry      Component Value Date/Time   NA 135* 03/19/2014 0349   K 3.6* 03/19/2014 0349   CL 101 03/19/2014 0349   CO2 26 03/19/2014 0349   BUN 8 03/19/2014 0349   CREATININE 0.77 03/19/2014 0349      Component Value Date/Time   CALCIUM 8.5 03/19/2014 0349   ALKPHOS 56  12/16/2013 1314   AST 28 12/16/2013 1314   ALT 31 12/16/2013 1314   BILITOT 1.0 12/16/2013 1314     Lab Results  Component Value Date   TSH 1.307 11/21/2013    IMPRESSION AND PLAN:  1) Acute sinusitis superimposed on allergic rhinitis. Amoxil 875mg  bid x 10d. Allegra 60 mg bid.    2) Constipation, chronic: start senakot S generic OTC, 2 tabs po qhs.    3) Fatigue: likely deconditioning secondary to surgery on 02/2014.  4) HTN; The current medical regimen is effective;  continue present plan and medications. CMET today.  5) Hyperlipidemia: FLP today.  An After Visit Summary was printed and given to the patient.  FOLLOW UP: 6 mo

## 2014-06-16 NOTE — Patient Instructions (Signed)
Buy generic for senakot S and take 2-4 tabs every night to help with constipation.

## 2014-06-17 ENCOUNTER — Other Ambulatory Visit: Payer: Self-pay | Admitting: Gynecology

## 2014-06-17 ENCOUNTER — Ambulatory Visit (HOSPITAL_COMMUNITY)
Admission: RE | Admit: 2014-06-17 | Discharge: 2014-06-17 | Disposition: A | Discharge status: Home / Self Care | Payer: Medicare Other | Source: Ambulatory Visit | Attending: Gynecology | Admitting: Gynecology

## 2014-06-17 DIAGNOSIS — Z1231 Encounter for screening mammogram for malignant neoplasm of breast: Secondary | ICD-10-CM | POA: Diagnosis not present

## 2014-06-18 ENCOUNTER — Other Ambulatory Visit: Payer: Self-pay | Admitting: Family Medicine

## 2014-06-18 ENCOUNTER — Telehealth: Payer: Self-pay | Admitting: Family Medicine

## 2014-06-18 MED ORDER — ATORVASTATIN CALCIUM 20 MG PO TABS: 20.0000 mg | ORAL_TABLET | Freq: Every day | ORAL | Status: DC

## 2014-06-18 NOTE — Telephone Encounter (Signed)
Received fax from [facility]. Patient is requesting a CB from Dr. Idelle Leech assistant.

## 2014-06-18 NOTE — Telephone Encounter (Signed)
Pt contacted.  See lab note.

## 2014-06-19 ENCOUNTER — Telehealth: Payer: Self-pay | Admitting: Family Medicine

## 2014-06-19 NOTE — Telephone Encounter (Signed)
Yes

## 2014-06-19 NOTE — Telephone Encounter (Signed)
Pt called back wanting to know if she did not start atorvastatin as suggested in her lab visit note, could you still check her lipid panel in 2-3 months?  Please advise.

## 2014-06-23 MED ORDER — TRIAMCINOLONE ACETONIDE 55 MCG/ACT NA AERO: INHALATION_SPRAY | NASAL | Status: DC

## 2014-06-23 NOTE — Telephone Encounter (Signed)
Nasacort rx sent to pharmacy.

## 2014-06-23 NOTE — Telephone Encounter (Signed)
Spoke with pt, advised message from Dr. Anitra Lauth. Pt requesting a Rx for nasacort because she feels her nasal spray isnt working. Please advise.

## 2014-06-24 NOTE — Telephone Encounter (Signed)
Spoke with pt, advised Rx sent to pharmacy.

## 2014-07-08 ENCOUNTER — Encounter: Payer: Self-pay | Admitting: Nurse Practitioner

## 2014-07-08 ENCOUNTER — Ambulatory Visit (INDEPENDENT_AMBULATORY_CARE_PROVIDER_SITE_OTHER): Payer: Medicare Other | Admitting: Nurse Practitioner

## 2014-07-08 VITALS — BP 146/84 | HR 64 | Temp 97.8°F | Ht 61.0 in | Wt 138.0 lb

## 2014-07-08 DIAGNOSIS — B029 Zoster without complications: Secondary | ICD-10-CM

## 2014-07-08 HISTORY — DX: Zoster without complications: B02.9

## 2014-07-08 MED ORDER — LIDOCAINE 5 % EX PTCH: 1.0000 | MEDICATED_PATCH | CUTANEOUS | Status: DC

## 2014-07-08 NOTE — Progress Notes (Signed)
   Subjective:    Patient ID: Alison Cuevas, female    DOB: 01-25-1946, 69 y.o.   MRN: [id number]  Rash This is a new problem. The current episode started 1 to 4 weeks ago (2 weeks). The problem has been gradually improving since onset. The affected locations include the right hip. The rash is characterized by blistering, redness, pain, itchiness and burning. She was exposed to nothing. Associated symptoms include fatigue. Pertinent negatives include no congestion, cough, diarrhea, fever, rhinorrhea, shortness of breath, sore throat or vomiting. Treatments tried: triple antibiotic ointment. The treatment provided no relief. Her past medical history is significant for varicella.      Review of Systems  Constitutional: Positive for fatigue. Negative for fever.  HENT: Negative for congestion, rhinorrhea and sore throat.   Respiratory: Negative for cough and shortness of breath.   Gastrointestinal: Negative for vomiting and diarrhea.  Skin: Positive for rash.       Objective:   Physical Exam  Constitutional: She is oriented to person, place, and time. She appears well-developed and well-nourished. No distress.  HENT:  Head: Normocephalic and atraumatic.  Eyes: Conjunctivae are normal. Right eye exhibits no discharge. Left eye exhibits no discharge.  Cardiovascular: Normal rate, regular rhythm and normal heart sounds.   No murmur heard. Pulmonary/Chest: Effort normal and breath sounds normal. No respiratory distress. She has no wheezes. She has no rales.  Neurological: She is alert and oriented to person, place, and time.  Skin: Skin is warm and dry. Rash noted.  Cluster of small vesicular mildly erythematous lesions, some w/crusts, over lateral R iliac crest.   Entire cluster about 3"x4". Tender to touch area.  Psychiatric: She has a normal mood and affect. Her behavior is normal. Thought content normal.  Vitals reviewed.         Assessment & Plan:  1. Shingles resolving -  lidocaine (LIDODERM) 5 %; Place 1 patch onto the skin daily. Remove & Discard patch within 12 hours or as directed by MD  Dispense: 30 patch; Refill: 0 Supportive care-see pt instructions F/u w/Dr McGowen in 4 weeks

## 2014-07-08 NOTE — Progress Notes (Signed)
Pre visit review using our clinic review tool, if applicable. No additional management support is needed unless otherwise documented below in the visit note. 

## 2014-07-08 NOTE — Patient Instructions (Signed)
Use lidoderm patch for discomfort. Place over rash. Leave on for 12hours, then remove for 12 hours.  Rest.  See Dr Anitra Lauth in 4 weeks.  Shingles Shingles (herpes zoster) is an infection that is caused by the same virus that causes chickenpox (varicella). The infection causes a painful skin rash and fluid-filled blisters, which eventually break open, crust over, and heal. It may occur in any area of the body, but it usually affects only one side of the body or face. The pain of shingles usually lasts about 1 month. However, some people with shingles may develop long-term (chronic) pain in the affected area of the body. Shingles often occurs many years after the person had chickenpox. It is more common:  In people older than 50 years.  In people with weakened immune systems, such as those with HIV, AIDS, or cancer.  In people taking medicines that weaken the immune system, such as transplant medicines.  In people under great stress. CAUSES  Shingles is caused by the varicella zoster virus (VZV), which also causes chickenpox. After a person is infected with the virus, it can remain in the person's body for years in an inactive state (dormant). To cause shingles, the virus reactivates and breaks out as an infection in a nerve root. The virus can be spread from person to person (contagious) through contact with open blisters of the shingles rash. It will only spread to people who have not had chickenpox. When these people are exposed to the virus, they may develop chickenpox. They will not develop shingles. Once the blisters scab over, the person is no longer contagious and cannot spread the virus to others. SIGNS AND SYMPTOMS  Shingles shows up in stages. The initial symptoms may be pain, itching, and tingling in an area of the skin. This pain is usually described as burning, stabbing, or throbbing.In a few days or weeks, a painful red rash will appear in the area where the pain, itching, and  tingling were felt. The rash is usually on one side of the body in a band or belt-like pattern. Then, the rash usually turns into fluid-filled blisters. They will scab over and dry up in approximately 2-3 weeks. Flu-like symptoms may also occur with the initial symptoms, the rash, or the blisters. These may include:  Fever.  Chills.  Headache.  Upset stomach. DIAGNOSIS  Your health care provider will perform a skin exam to diagnose shingles. Skin scrapings or fluid samples may also be taken from the blisters. This sample will be examined under a microscope or sent to a lab for further testing. TREATMENT  There is no specific cure for shingles. Your health care provider will likely prescribe medicines to help you manage the pain, recover faster, and avoid long-term problems. This may include antiviral drugs, anti-inflammatory drugs, and pain medicines. HOME CARE INSTRUCTIONS   Take a cool bath or apply cool compresses to the area of the rash or blisters as directed. This may help with the pain and itching.   Take medicines only as directed by your health care provider.   Rest as directed by your health care provider.  Keep your rash and blisters clean with mild soap and cool water or as directed by your health care provider.  Do not pick your blisters or scratch your rash. Apply an anti-itch cream or numbing creams to the affected area as directed by your health care provider.  Keep your shingles rash covered with a loose bandage (dressing).  Avoid skin contact  with:  Babies.   Pregnant women.   Children with eczema.   Elderly people with transplants.   People with chronic illnesses, such as leukemia or AIDS.   Wear loose-fitting clothing to help ease the pain of material rubbing against the rash.  Keep all follow-up visits as directed by your health care provider.If the area involved is on your face, you may receive a referral for a specialist, such as an eye doctor  (ophthalmologist) or an ear, nose, and throat (ENT) doctor. Keeping all follow-up visits will help you avoid eye problems, chronic pain, or disability.  SEEK IMMEDIATE MEDICAL CARE IF:   You have facial pain, pain around the eye area, or loss of feeling on one side of your face.  You have ear pain or ringing in your ear.  You have loss of taste.  Your pain is not relieved with prescribed medicines.   Your redness or swelling spreads.   You have more pain and swelling.  Your condition is worsening or has changed.   You have a fever. MAKE SURE YOU:  Understand these instructions.  Will watch your condition.  Will get help right away if you are not doing well or get worse. Document Released: 06/13/2005 Document Revised: 10/28/2013 Document Reviewed: 01/26/2012 [facility] Patient Information 2015 [place], [place]. This information is not intended to replace advice given to you by your health care provider. Make sure you discuss any questions you have with your health care provider.

## 2014-07-09 ENCOUNTER — Telehealth: Payer: Self-pay | Admitting: *Deleted

## 2014-07-09 ENCOUNTER — Telehealth: Payer: Self-pay | Admitting: Family Medicine

## 2014-07-09 NOTE — Telephone Encounter (Signed)
Pt was dx'd with shingles 07/08/14 and wants to know when she can get her zostavax.  Please advise.

## 2014-07-09 NOTE — Telephone Encounter (Signed)
Duplicate message. 

## 2014-07-09 NOTE — Telephone Encounter (Signed)
Please call and talk to her about her visit yesterday. She has a couple of questions.Alison Cuevas

## 2014-07-09 NOTE — Telephone Encounter (Signed)
Patient called office requesting that Lattie Haw give a call back. Patient stated that she has some questions she wants to ask her.

## 2014-07-09 NOTE — Telephone Encounter (Signed)
She has to wait at least 3 months AFTER the rash completely resolves before getting the zostavax.

## 2014-07-10 MED ORDER — VALACYCLOVIR HCL 1 G PO TABS: 1000.0000 mg | ORAL_TABLET | Freq: Three times a day (TID) | ORAL | Status: DC

## 2014-07-10 NOTE — Telephone Encounter (Signed)
May call in valtrex 1g, 1 tab po tid x 7d, #21, no RF.  -thx

## 2014-07-10 NOTE — Telephone Encounter (Signed)
Pt would like to know if there is any medication she can use to help the shingles?  She now has a blister in her mouth.

## 2014-07-10 NOTE — Telephone Encounter (Signed)
Patient aware.

## 2014-07-11 ENCOUNTER — Other Ambulatory Visit: Payer: Self-pay | Admitting: Gynecology

## 2014-07-11 DIAGNOSIS — Z1231 Encounter for screening mammogram for malignant neoplasm of breast: Secondary | ICD-10-CM

## 2014-07-17 ENCOUNTER — Telehealth: Payer: Self-pay | Admitting: Family Medicine

## 2014-07-17 NOTE — Telephone Encounter (Signed)
[place] [facility] - Client [facility] Patient Name: Alison Cuevas ANAGNOS DOB: 03-May-1946 Initial Comment Caller states she has a yeast infection; is needing something called in. Nurse Assessment Nurse: Markus Daft, RN, Sherre Poot Date/Time Eilene Ghazi Time): 07/17/2014 9:12:00 AM Confirm and document reason for call. If symptomatic, describe symptoms. ---Caller states she is worried that she is going to get a yeast infection and not be able to get out d/t a snow storm coming to get meds if needed. She is needing something called in like Diflucan. Dr. Kathlen Mody diagnosed her with Shingles on her left side and right side of abdomen and hip and placed on "antibiotics", Valacyclovir HCL TID and has a very bad tendency to develop yeast infection and wants one just in case as the weather is going to be bad. Today is last day of pill. Has the patient traveled out of the country within the last 30 days? ---Not Applicable Does the patient require triage? ---No Please document clinical information provided and list any resource used. ---Denies s/s. - RN clarified and this is an anti viral. She should not develop a yeast infection from an antiviral. Caller verb. understanding. (per RN clinical exp). Guidelines Guideline Title Affirmed Question Affirmed Notes Final Disposition User Clinical Call [place], RN, [employer]

## 2014-07-17 NOTE — Telephone Encounter (Signed)
Patient aware anti-vital should not cause yeast infection.

## 2014-07-30 ENCOUNTER — Encounter: Payer: Self-pay | Admitting: Gynecology

## 2014-07-30 ENCOUNTER — Ambulatory Visit (INDEPENDENT_AMBULATORY_CARE_PROVIDER_SITE_OTHER): Payer: Medicare Other | Admitting: Gynecology

## 2014-07-30 VITALS — BP 120/78

## 2014-07-30 DIAGNOSIS — R102 Pelvic and perineal pain: Secondary | ICD-10-CM

## 2014-07-30 DIAGNOSIS — N952 Postmenopausal atrophic vaginitis: Secondary | ICD-10-CM | POA: Diagnosis not present

## 2014-07-30 LAB — URINALYSIS W MICROSCOPIC + REFLEX CULTURE
Bilirubin Urine: NEGATIVE
Casts: NONE SEEN
Crystals: NONE SEEN
Glucose, UA: NEGATIVE mg/dL
Ketones, ur: NEGATIVE mg/dL
Nitrite: NEGATIVE
Protein, ur: NEGATIVE mg/dL
Specific Gravity, Urine: 1.01 (ref 1.005–1.030)
Urobilinogen, UA: 0.2 mg/dL (ref 0.0–1.0)
pH: 6 (ref 5.0–8.0)

## 2014-07-30 LAB — WET PREP FOR TRICH, YEAST, CLUE
Clue Cells Wet Prep HPF POC: NONE SEEN
Trich, Wet Prep: NONE SEEN
Yeast Wet Prep HPF POC: NONE SEEN

## 2014-07-30 NOTE — Progress Notes (Signed)
Alison Cuevas 10-Feb-1946 [id number]        69 y.o.  G4P0013 Presents complaining of vaginal burning and discomfort.  Status post vaginal vault prolapse after hysterectomy. Underwent anterior/posterior repair with vault suspension graft by Dr. Bernerd Limbo 03/18/2014. Did well until most recently when doing some lifting and straining and started to feel some vaginal discomfort and burning. No significant discharge. No urinary frequency dysuria urgency. Some constipation for which she is now using laxatives. No fever or chills  Past medical history,surgical history, problem list, medications, allergies, family history and social history were all reviewed and documented in the EPIC chart.  Directed ROS with pertinent positives and negatives documented in the history of present illness/assessment and plan.  Exam: [name] assistant General appearance:  Normal Abdomen soft nontender without masses guarding rebound Pelvic external BUS vagina with atrophic changes. Cuff well supported. Graft palpated anterior upper vagina intact without evidence of erosion. No significant cystocele/rectocele. Bimanual without masses or tenderness. Rectal exam is normal  Assessment/Plan:  69 y.o. G4P0013 with discomfort as described above. Wet prep and urinalysis are negative. I suspect that she stressed the graft with straining. It feels intact and of recommended increasing rest over the next several days. Avoid heavy lifting over the next several months until she is further out from the surgery. The vaginal burning may be due to atrophic changes in the options to reinitiate vaginal estrogen discussed as she has used this in the past. She is unsure whether she really wants to start this at this point. We have discussed the issues of absorption and systemic risks including thrombosis/breast cancer. Patient wants to monitor at present and will call me back if she ultimately decides she wants to start the estrogen cream and we will  prescribe it for her.     [name] MD, 3:33 PM 07/30/2014

## 2014-07-30 NOTE — Patient Instructions (Signed)
Call if your discomfort continues.

## 2014-07-31 LAB — URINE CULTURE
Colony Count: NO GROWTH
Organism ID, Bacteria: NO GROWTH

## 2014-08-05 ENCOUNTER — Telehealth: Payer: Self-pay | Admitting: *Deleted

## 2014-08-05 ENCOUNTER — Ambulatory Visit (HOSPITAL_COMMUNITY)
Admission: RE | Admit: 2014-08-05 | Discharge: 2014-08-05 | Disposition: A | Discharge status: Home / Self Care | Payer: Medicare Other | Source: Ambulatory Visit | Attending: Family Medicine | Admitting: Family Medicine

## 2014-08-05 ENCOUNTER — Ambulatory Visit: Payer: Medicare Other | Admitting: Nurse Practitioner

## 2014-08-05 ENCOUNTER — Ambulatory Visit (INDEPENDENT_AMBULATORY_CARE_PROVIDER_SITE_OTHER): Payer: Medicare Other | Admitting: Family Medicine

## 2014-08-05 ENCOUNTER — Encounter: Payer: Self-pay | Admitting: Family Medicine

## 2014-08-05 VITALS — BP 138/75 | HR 77 | Temp 98.2°F | Ht 61.0 in | Wt 133.0 lb

## 2014-08-05 DIAGNOSIS — R32 Unspecified urinary incontinence: Secondary | ICD-10-CM | POA: Insufficient documentation

## 2014-08-05 DIAGNOSIS — K5900 Constipation, unspecified: Secondary | ICD-10-CM

## 2014-08-05 DIAGNOSIS — R102 Pelvic and perineal pain: Secondary | ICD-10-CM | POA: Diagnosis not present

## 2014-08-05 DIAGNOSIS — R3915 Urgency of urination: Secondary | ICD-10-CM | POA: Insufficient documentation

## 2014-08-05 DIAGNOSIS — B029 Zoster without complications: Secondary | ICD-10-CM

## 2014-08-05 DIAGNOSIS — N3941 Urge incontinence: Secondary | ICD-10-CM | POA: Diagnosis not present

## 2014-08-05 MED ORDER — DICLOFENAC SODIUM 75 MG PO TBEC: DELAYED_RELEASE_TABLET | ORAL | Status: DC

## 2014-08-05 MED ORDER — ESTRADIOL 0.1 MG/GM VA CREA: TOPICAL_CREAM | VAGINAL | Status: DC

## 2014-08-05 MED ORDER — HYDROCODONE-ACETAMINOPHEN 5-325 MG PO TABS: ORAL_TABLET | ORAL | Status: DC

## 2014-08-05 NOTE — Telephone Encounter (Signed)
Left on pt voicemail Rx sent to pharmacy per pt request.

## 2014-08-05 NOTE — Telephone Encounter (Signed)
Pt called to follow up from [place] 07/30/14 vaginal burning worse now, pt was told to call back if symptoms should worsen. Please advise

## 2014-08-05 NOTE — Patient Instructions (Signed)
Take over the counter, generic senakot S (sometimes called "senna plus") 2 tabs every night.

## 2014-08-05 NOTE — Telephone Encounter (Signed)
I would get started on vaginal estrogen cream three times weekly.  If she does not have a supply, estrace vag cream 1/4 applicator 3 x weekly disp one, refill x 2

## 2014-08-05 NOTE — Progress Notes (Signed)
Pre visit review using our clinic review tool, if applicable. No additional management support is needed unless otherwise documented below in the visit note. 

## 2014-08-05 NOTE — Telephone Encounter (Signed)
Rx sent, will call pt back later she is at a doctor appointment now.

## 2014-08-05 NOTE — Progress Notes (Signed)
OFFICE NOTE  08/05/2014  CC:  Chief Complaint  Patient presents with  . Follow-up   HPI: Patient is a 69 y.o. Caucasian female who is here for 1 mo f/u dx of herpes zoster affecting the right hip region (dx'd 07/08/14 and rx'd lidoderm patch.  On 07/10/14 I authorized call-in of valtrex).  However, pt feeling bad today, reports: Says she lifted something wrong 2 wks ago and has been feeling vaginal burning (and her "bladder area" was sore but this is gone now).  She took a vicodin 1/2 tab very early this morning b/c she was up a lot suffering with burning in vaginal area.  She put a message in to her GYN and is waiting for CB.  Review of EMR shows that Dr. Phineas Real has responded: recommended vaginal estrogen cream.  She says she has had some of this at home and has been taking it lately but only for the last 4d or so.   Says she has been constipated as well, last "decent" BM was 7-8 days ago, hard/large balls of stool.  She took a stool softener (colace 2 tabs) and had some loose stool after that.  She gave herself a fleets OTC enema 3 nights ago and it produced a BM.   Says she has been taking vicodin "some" over the last 2 weeks since her lifting incident but not daily. Recent o/v with Dr.Fontaine revealed a reassuring pelvic exam.  Rash dx'd as zoster is completely gone and she has no pain in the area where her rash was.  Pertinent PMH:  Past medical, surgical, social, and family history reviewed and changes are noted since last office visit.  MEDS: Not taking the colace listed below on a daily basis. Outpatient Prescriptions Prior to Visit  Medication Sig Dispense Refill  . ALPRAZolam (XANAX) 0.5 MG tablet Take 0.5 mg by mouth 3 (three) times daily as needed for anxiety.    . docusate sodium (COLACE) 100 MG capsule Take 1 capsule (100 mg total) by mouth 2 (two) times daily. 20 capsule 0  . estradiol (ESTRACE VAGINAL) 0.1 MG/GM vaginal cream Apply 1/4 applicator 3 times weekly vaginally.  42.5 g 2  . fexofenadine (ALLEGRA) 60 MG tablet Take 1 tablet (60 mg total) by mouth 2 (two) times daily. 60 tablet 6  . fluticasone (FLONASE) 50 MCG/ACT nasal spray Place 2 sprays into both nostrils at bedtime.    Marland Kitchen guaiFENesin (MUCINEX) 600 MG 12 hr tablet Take 1,200 mg by mouth 2 (two) times daily as needed for cough or to loosen phlegm.     Marland Kitchen HYDROcodone-acetaminophen (NORCO/VICODIN) 5-325 MG per tablet 1 tab po tid prn pain 90 tablet 0  . lidocaine (LIDODERM) 5 % Place 1 patch onto the skin daily. Remove & Discard patch within 12 hours or as directed by MD 30 patch 0  . lisinopril (PRINIVIL,ZESTRIL) 10 MG tablet Take 5 mg by mouth at bedtime. If b/p greater than 140/90 patient takes 10 mg po    . triamcinolone (NASACORT ALLERGY 24HR) 55 MCG/ACT AERO nasal inhaler 2 sprays in each nostril once daily 1 Inhaler 12  . verapamil (CALAN-SR) 180 MG CR tablet Take 1 tablet (180 mg total) by mouth at bedtime. 90 tablet 1   No facility-administered medications prior to visit.    PE: Blood pressure 138/75, pulse 77, temperature 98.2 F (36.8 C), temperature source Temporal, height 5\' 1"  (1.549 m), weight 133 lb (60.328 kg), last menstrual period 06/27/1972, SpO2 97 %.  Pt examined  with Jacklynn Ganong, CMA, as chaperone. Gen: appears tired but is in NAD. AFFECT: pleasant, lucid thought and speech. SKIN: no rash. ABD: soft, nondistended.  BS slightly hypoactive.  No signif TTP anywhere.  No mass, HSM, or bruit. EXT: no clubbing, cyanosis, or edema.    IMPRESSION AND PLAN:  1) Herpes zoster: resolved without sequelae.  2) Pelvic pain/vag burning: per Dr. Phineas Real she'll be starting vag estrogen cream. Constipation likely contributing.  Will check KUB today.  I told her to start 2 senakot S generic tabs daily. Limit vicodin intake as much as possible.  I also rx'd voltaren 75mg  bid for her to use as her 1st line pain med--told to take with food.  An After Visit Summary was printed and given to  the patient.  FOLLOW UP: 2 wks

## 2014-08-06 ENCOUNTER — Telehealth: Payer: Self-pay | Admitting: Family Medicine

## 2014-08-06 NOTE — Telephone Encounter (Signed)
Pt LMOM stating her GYN gave her diclofenac tabs to take but her pharmacist told her not to take these with her meloxicam.  She wants to know if she is supposed to be taking meloxicam or not?  Please advise.

## 2014-08-06 NOTE — Telephone Encounter (Signed)
Pt is on meloxicam and will not take the voltaren.  Pt wants to know if she can just take 1 senakot on the night before church so she doesn't have any accidents.  Please advise.

## 2014-08-06 NOTE — Telephone Encounter (Signed)
Meloxicam is not on her med list. If she has meloxicam then she should take this for pain and don't take the voltaren tabs. Pls update her medlist if she can clarify this with you over the phone.

## 2014-08-06 NOTE — Telephone Encounter (Signed)
Yes, fine to take just 1 senakot on the night before church.

## 2014-08-07 NOTE — Telephone Encounter (Signed)
Patient aware.

## 2014-08-08 ENCOUNTER — Telehealth: Payer: Self-pay

## 2014-08-08 NOTE — Telephone Encounter (Signed)
Patient informed. 

## 2014-08-08 NOTE — Telephone Encounter (Signed)
Questions about her repair need to be addressed with Dr. Bernerd Limbo. She needs to use the estrogen cream on a regular basis which she results and this can take several weeks of three-time weekly applications vaginally. If she is talking about discomfort deeper inside than she needs to follow up with Dr. Bernerd Limbo. She is talking about superficial irritative discomfort in the estrogen cream hopefully will help.

## 2014-08-08 NOTE — Telephone Encounter (Signed)
Patient called because she is very frustrated with how uncomfortable she is.  She saw Dr. Ernestine Conrad on the 9th Feb (visit is in her chart) and he had her d/c Colace and start taking Senekot due to some constipation issues.  She said it makes her very bloated and she asked if she could go back to the Colace.  I told her she should probably check with Dr. Jerilynn Mages since he is the one who recommended that.  She is concerned because since she has been using it the discomfort at her urethra that had resolved has returned. She said she remembered Dr. McDiarmid telling her not to take a laxation (she has had rectocele repair) and she said the Senekot has some laxative in it. She said she didn't want to hurt anything related to her prolapse repair.  She used the estrogen cream last night vaginally and had some concerned she may have put the appful in to far as she tapped something and felt discomfort. I reassured her it was likely fine. I told her she did not have to put it in very far and to put a little cream on the applicator prior to inserting to lubricate it.  She asked if she could put a little on her finger and apply it tonight since still burning so much. I told her I thought that would be fine.  I told her I would run this conversation by you to see if you had any suggestions for her.

## 2014-08-19 ENCOUNTER — Ambulatory Visit: Payer: Medicare Other | Admitting: Family Medicine

## 2014-08-22 ENCOUNTER — Encounter: Payer: Self-pay | Admitting: Family Medicine

## 2014-08-22 ENCOUNTER — Ambulatory Visit (INDEPENDENT_AMBULATORY_CARE_PROVIDER_SITE_OTHER): Payer: Medicare Other | Admitting: Family Medicine

## 2014-08-22 VITALS — BP 121/73 | HR 72 | Temp 97.8°F | Resp 18 | Ht 61.0 in | Wt 131.0 lb

## 2014-08-22 DIAGNOSIS — K5900 Constipation, unspecified: Secondary | ICD-10-CM

## 2014-08-22 DIAGNOSIS — K5909 Other constipation: Secondary | ICD-10-CM

## 2014-08-22 DIAGNOSIS — L309 Dermatitis, unspecified: Secondary | ICD-10-CM

## 2014-08-22 MED ORDER — FLUTICASONE PROPIONATE 0.05 % EX CREA: TOPICAL_CREAM | CUTANEOUS | Status: DC

## 2014-08-22 NOTE — Progress Notes (Signed)
OFFICE NOTE  08/22/2014  CC:  Chief Complaint  Patient presents with  . Follow-up  . Rash   HPI: Patient is a 69 y.o. Caucasian female who is here for 2 wk f/u constipation. Has changed diet: more fruit, less sweets.  Taking miralax lately for last 2 d.  Starting to have formed BMs now. She is lessening her use of vicodin, takes meloxicam daily.  Rash noted on chest last couple weeks, itches quite a bit.  No pain.  Just a redness to skin and maybe some very fine bumpiness but no vesicles or pustules or papules.  No burning.  She has applied lidocaine patch to it lately as well as calamine.   Pertinent PMH:  Past medical, surgical, social, and family history reviewed and no changes are noted since last office visit.  MEDS:  Outpatient Prescriptions Prior to Visit  Medication Sig Dispense Refill  . ALPRAZolam (XANAX) 0.5 MG tablet Take 0.5 mg by mouth 3 (three) times daily as needed for anxiety.    . docusate sodium (COLACE) 100 MG capsule Take 1 capsule (100 mg total) by mouth 2 (two) times daily. 20 capsule 0  . estradiol (ESTRACE VAGINAL) 0.1 MG/GM vaginal cream Apply 1/4 applicator 3 times weekly vaginally. 42.5 g 2  . fexofenadine (ALLEGRA) 60 MG tablet Take 1 tablet (60 mg total) by mouth 2 (two) times daily. 60 tablet 6  . fluticasone (FLONASE) 50 MCG/ACT nasal spray Place 2 sprays into both nostrils at bedtime.    Marland Kitchen guaiFENesin (MUCINEX) 600 MG 12 hr tablet Take 1,200 mg by mouth 2 (two) times daily as needed for cough or to loosen phlegm.     Marland Kitchen HYDROcodone-acetaminophen (NORCO/VICODIN) 5-325 MG per tablet 1 tab po tid prn pain 90 tablet 0  . lidocaine (LIDODERM) 5 % Place 1 patch onto the skin daily. Remove & Discard patch within 12 hours or as directed by MD 30 patch 0  . lisinopril (PRINIVIL,ZESTRIL) 10 MG tablet Take 5 mg by mouth at bedtime. If b/p greater than 140/90 patient takes 10 mg po    . meloxicam (MOBIC) 15 MG tablet Take 15 mg by mouth daily.    Marland Kitchen triamcinolone  (NASACORT ALLERGY 24HR) 55 MCG/ACT AERO nasal inhaler 2 sprays in each nostril once daily 1 Inhaler 12  . verapamil (CALAN-SR) 180 MG CR tablet Take 1 tablet (180 mg total) by mouth at bedtime. 90 tablet 1   No facility-administered medications prior to visit.    PE: Blood pressure 121/73, pulse 72, temperature 97.8 F (36.6 C), temperature source Temporal, resp. rate 18, height 5\' 1"  (1.549 m), weight 131 lb (59.421 kg), last menstrual period 06/27/1972, SpO2 99 %.  Pt examined with April Miller, CMA, as chaperone. Gen: Alert, well appearing.  Patient is oriented to person, place, time, and situation. SKIN: left upper chest wall with mildly erythematous macular rash with a couple of excoriations.  No vesicles or pustules or ulcerations.  IMPRESSION AND PLAN:  1) Constipation; improving.  Continue prn miralax, prn senakot S, limit use of narcotic pain med.  2) RAsh, nonspecific dermatitis: cutivate 0.05% cream rx'd, apply bid prn. Stop lidocaine patch.  An After Visit Summary was printed and given to the patient.  FOLLOW UP: prn

## 2014-08-29 ENCOUNTER — Ambulatory Visit (INDEPENDENT_AMBULATORY_CARE_PROVIDER_SITE_OTHER): Payer: Medicare Other | Admitting: Nurse Practitioner

## 2014-08-29 ENCOUNTER — Encounter: Payer: Self-pay | Admitting: Nurse Practitioner

## 2014-08-29 VITALS — BP 110/70 | HR 72 | Temp 97.9°F | Ht 61.0 in | Wt 133.0 lb

## 2014-08-29 DIAGNOSIS — J069 Acute upper respiratory infection, unspecified: Secondary | ICD-10-CM | POA: Diagnosis not present

## 2014-08-29 NOTE — Progress Notes (Signed)
Pre visit review using our clinic review tool, if applicable. No additional management support is needed unless otherwise documented below in the visit note. 

## 2014-08-29 NOTE — Patient Instructions (Signed)
Consider using Neilmed sinus rinse daily for about 1 week.  Use affrin nasal spray both sides for 3 days. Stop for 1 day, then use for 3 days again.  Use 400 mg ibuprophen with food twice daily for headache. Do not use meloxicam with ibuprophen.  Stop mucinex.  This viral infection may last 10 days. Let us know if you are not feeling better.

## 2014-08-29 NOTE — Progress Notes (Signed)
   Subjective:    Patient ID: Alison Cuevas, female    DOB: 01-Jun-1946, 69 y.o.   MRN: [id number]  Headache  This is a new problem. The current episode started in the past 7 days (5d). The problem has been unchanged. The pain is located in the bilateral and frontal region. The pain does not radiate. The pain quality is similar to prior headaches. The quality of the pain is described as aching. The pain is moderate. Associated symptoms include drainage (sinus). Pertinent negatives include no coughing, fever, nausea or sore throat. Nothing aggravates the symptoms. Treatments tried: mucinex, sinus rinse. The treatment provided no relief. Her past medical history is significant for hypertension.      Review of Systems  Constitutional: Negative for fever.  HENT: Negative for sore throat.   Respiratory: Negative for cough.   Gastrointestinal: Negative for nausea.  Neurological: Positive for headaches.       Objective:   Physical Exam  Constitutional: She is oriented to person, place, and time. She appears well-developed and well-nourished. No distress.  HENT:  Head: Normocephalic and atraumatic.  Right Ear: External ear normal.  Left Ear: External ear normal.  Mouth/Throat: Oropharynx is clear and moist. No oropharyngeal exudate.  Clear fluid bilat TM, bones visible  Eyes: Conjunctivae are normal. Right eye exhibits no discharge. Left eye exhibits no discharge.  Neck: Normal range of motion. Neck supple. No thyromegaly present.  Cardiovascular: Normal rate, regular rhythm and normal heart sounds.   No murmur heard. Pulmonary/Chest: Effort normal and breath sounds normal. No respiratory distress. She has no wheezes. She has no rales.  Lymphadenopathy:    She has no cervical adenopathy.  Neurological: She is alert and oriented to person, place, and time.  Skin: Skin is warm and dry.  Psychiatric: She has a normal mood and affect. Her behavior is normal. Thought content normal.  Vitals  reviewed.         Assessment & Plan:   1. Acute upper respiratory infection Symptom management Afrin, ibuprophen, sinus rinse See pt instructions F/u PRN

## 2014-09-25 ENCOUNTER — Other Ambulatory Visit: Payer: Self-pay | Admitting: *Deleted

## 2014-09-25 MED ORDER — ALPRAZOLAM 0.5 MG PO TABS: 0.5000 mg | ORAL_TABLET | Freq: Three times a day (TID) | ORAL | Status: DC | PRN

## 2014-09-25 NOTE — Telephone Encounter (Signed)
Refill request for alprazolam Last filled by MD on- 01/27/14 #90 x5 Last Appt: 08/22/2014 Next Appt: 12/16/2014 Please advise refill?

## 2014-09-29 ENCOUNTER — Ambulatory Visit (INDEPENDENT_AMBULATORY_CARE_PROVIDER_SITE_OTHER): Payer: Medicare Other | Admitting: Nurse Practitioner

## 2014-09-29 ENCOUNTER — Encounter: Payer: Self-pay | Admitting: Nurse Practitioner

## 2014-09-29 ENCOUNTER — Telehealth: Payer: Self-pay | Admitting: Family Medicine

## 2014-09-29 VITALS — BP 134/84 | HR 66 | Temp 97.9°F | Ht 61.0 in | Wt 133.0 lb

## 2014-09-29 DIAGNOSIS — W19XXXA Unspecified fall, initial encounter: Secondary | ICD-10-CM | POA: Diagnosis not present

## 2014-09-29 DIAGNOSIS — Z23 Encounter for immunization: Secondary | ICD-10-CM

## 2014-09-29 DIAGNOSIS — Y92099 Unspecified place in other non-institutional residence as the place of occurrence of the external cause: Secondary | ICD-10-CM

## 2014-09-29 DIAGNOSIS — Y92009 Unspecified place in unspecified non-institutional (private) residence as the place of occurrence of the external cause: Secondary | ICD-10-CM

## 2014-09-29 NOTE — Patient Instructions (Signed)
Use moist heat pack on neck several times daily. Take meloxicam as directed. You may add 1000 mg tylenol twice daily if needed.  You will likely be sore for another week, with improvement daily. Bruises will take another 2 weeks to resolve.   Return at your convenience for pneumonia vaccine.

## 2014-09-29 NOTE — Telephone Encounter (Signed)
Pt called stating she fell about a week ago and wanted to know if she should use ice or heat on her neck pain?   Please advise.     I did have her schedule because I told her that would be her best idea, however, I wanted her to be in a 30 minute slot so the earliest I could schedule her was for Thursday.

## 2014-09-29 NOTE — Telephone Encounter (Signed)
Pt called back & scheduled an appt with Layne this afternoon

## 2014-09-29 NOTE — Progress Notes (Signed)
Subjective:     Alison Cuevas is a 69 y.o. female presents after falling at home 1 week ago. She is c/o bruises on legs, & RUQ & epigastric abd pain.  She fell from deck-about 3 ft high. Leaning against rail. Rail "gave way". She fell over trash cans onto grass. Caught fall w/L arm & side. Denies hitting head. Got up immediately. Felt OK. She has had some intermittent abd pain since fall, slight nausea this morning relieved after eating. Denies discolored urine or black stools. Slight pain anterior robs w/deep inspiration. Denies cough. She is worried that she may have "messed something up" r/t bladder tack she had 6 mos ago.  She also c/o neck pain. This is chronic. She has Hx of neck surgery. She denies HA, paresthesias arms/hands.  She is taking meloxicam once daily w/moderate relief.  The following portions of the patient's history were reviewed and updated as appropriate: allergies, current medications, past medical history, past social history, past surgical history and problem list.  Review of Systems Pertinent items are noted in HPI.    Objective:    BP 134/84 mmHg  Pulse 66  Temp(Src) 97.9 F (36.6 C) (Oral)  Ht 5\' 1"  (1.549 m)  Wt 133 lb (60.328 kg)  BMI 25.14 kg/m2  SpO2 95%  LMP 06/27/1972 BP 134/84 mmHg  Pulse 66  Temp(Src) 97.9 F (36.6 C) (Oral)  Ht 5\' 1"  (1.549 m)  Wt 133 lb (60.328 kg)  BMI 25.14 kg/m2  SpO2 95%  LMP 06/27/1972 General appearance: alert, cooperative, appears stated age and no distress Head: Normocephalic, without obvious abnormality, atraumatic Eyes: negative findings: lids and lashes normal and conjunctivae and sclerae normal Neck: limited ROM-this is chronic Lungs: clear to auscultation bilaterally Heart: regular rate and rhythm, S1, S2 normal, no murmur, click, rub or gallop Abdomen: soft, non-tender; bowel sounds normal; no masses,  no organomegaly Skin: no bruises over abdomen or chest. Skin tear R hand, well approximated, no signs of  infection. No bleeding. Multiple bruises over anterior upper thighs & 1 bruise posterior R calf. Chest: tender when apply pressure at base if anterior ribs. No sternal tenderness. Neurologic: Grossly normal    Assessment:Plan   1. Need for Tdap vaccination - Tdap vaccine greater than or equal to 7yo IM  2. Fall at home, initial encounter Moist heat to neck Continue meloxicam Add tylenol as needed F/u PRN See pt instructions

## 2014-09-29 NOTE — Progress Notes (Signed)
Pre visit review using our clinic review tool, if applicable. No additional management support is needed unless otherwise documented below in the visit note. 

## 2014-09-29 NOTE — Telephone Encounter (Signed)
Noted  

## 2014-10-02 ENCOUNTER — Ambulatory Visit: Payer: Medicare Other | Admitting: Family Medicine

## 2014-11-12 ENCOUNTER — Other Ambulatory Visit: Payer: Self-pay | Admitting: *Deleted

## 2014-11-12 ENCOUNTER — Other Ambulatory Visit: Payer: Self-pay | Admitting: Pulmonary Disease

## 2014-11-12 MED ORDER — MELOXICAM 15 MG PO TABS
15.0000 mg | ORAL_TABLET | Freq: Every day | ORAL | Status: DC
Start: 2014-11-12 — End: 2015-08-18

## 2014-11-12 NOTE — Telephone Encounter (Signed)
Fax from [facility] [address]) requesting refill on Meloxicam 15mg  take 1 tab po daily prn pain. LOV 09/29/14, up coming ov on 12/16/14, last written unknown. Please advised. Thanks.

## 2014-11-13 ENCOUNTER — Other Ambulatory Visit: Payer: Self-pay | Admitting: Family Medicine

## 2014-11-13 ENCOUNTER — Telehealth: Payer: Self-pay | Admitting: Family Medicine

## 2014-11-13 MED ORDER — ZOSTER VACCINE LIVE 19400 UNT/0.65ML ~~LOC~~ SOLR: 0.6500 mL | Freq: Once | SUBCUTANEOUS | Status: DC

## 2014-11-13 NOTE — Telephone Encounter (Signed)
Pls notify pt that I checked her chart and it is fine for her to get the shingles vaccine now. I printed this rx and she'll need to either pick it up and take it to a pharmacy that gives the injection (such as [facility] or [facility]: [facility] and last time I checked they did NOT give this vaccine there) or tell us what pharmacy to fax it to (I do not give the shingles vaccine in our office)-thx

## 2014-11-14 NOTE — Telephone Encounter (Signed)
Pt advised and voiced understanding. Per pts request Rx faxed to [facility]).

## 2014-12-16 ENCOUNTER — Ambulatory Visit: Payer: Medicare Other | Admitting: Family Medicine

## 2014-12-23 ENCOUNTER — Encounter: Payer: Self-pay | Admitting: Family Medicine

## 2014-12-23 ENCOUNTER — Ambulatory Visit (INDEPENDENT_AMBULATORY_CARE_PROVIDER_SITE_OTHER): Payer: Medicare Other | Admitting: Family Medicine

## 2014-12-23 VITALS — BP 121/74 | HR 60 | Temp 97.8°F | Resp 16 | Ht 61.0 in | Wt 140.0 lb

## 2014-12-23 DIAGNOSIS — Z23 Encounter for immunization: Secondary | ICD-10-CM | POA: Diagnosis not present

## 2014-12-23 DIAGNOSIS — I10 Essential (primary) hypertension: Secondary | ICD-10-CM | POA: Diagnosis not present

## 2014-12-23 LAB — BASIC METABOLIC PANEL
BUN: 13 mg/dL (ref 6–23)
CO2: 28 mEq/L (ref 19–32)
Calcium: 9.5 mg/dL (ref 8.4–10.5)
Chloride: 102 mEq/L (ref 96–112)
Creatinine, Ser: 0.92 mg/dL (ref 0.40–1.20)
GFR: 64.1 mL/min (ref >60.00)
Glucose, Bld: 94 mg/dL (ref 70–99)
Potassium: 4.3 mEq/L (ref 3.5–5.1)
Sodium: 137 mEq/L (ref 135–145)

## 2014-12-23 NOTE — Progress Notes (Signed)
OFFICE NOTE  12/29/2014  CC:  Chief Complaint  Patient presents with  . Follow-up    6 month f/u. Pt is fasting.      HPI: Patient is a 69 y.o. Caucasian female who is here for 6 mo f/u HTN, anxiety, GERD, chronic LBP. Doing ok. Takes xanax mostly for her fibromyalgia for muscle relaxation.  Also taking prn mobic and vicodin. Compliant with bp home monitoring; normal.   GERD has not been much of an issue lately, and she remains off of any daily PPI treatment.  ROS: no HA's, palpitations, SOB, chest pains, cough, or LE swelling.    Pertinent PMH:  Past medical, surgical, social, and family history reviewed and no changes are noted since last office visit.  MEDS:  Outpatient Prescriptions Prior to Visit  Medication Sig Dispense Refill  . ALPRAZolam (XANAX) 0.5 MG tablet Take 1 tablet (0.5 mg total) by mouth 3 (three) times daily as needed for anxiety. 90 tablet 5  . docusate sodium (COLACE) 100 MG capsule Take 1 capsule (100 mg total) by mouth 2 (two) times daily. 20 capsule 0  . estradiol (ESTRACE VAGINAL) 0.1 MG/GM vaginal cream Apply 1/4 applicator 3 times weekly vaginally. 42.5 g 2  . fexofenadine (ALLEGRA) 60 MG tablet Take 1 tablet (60 mg total) by mouth 2 (two) times daily. 60 tablet 6  . fluticasone (CUTIVATE) 0.05 % cream Apply to rash on left upper chest area bid prn 30 g 1  . fluticasone (FLONASE) 50 MCG/ACT nasal spray Place 2 sprays into both nostrils at bedtime.    Marland Kitchen HYDROcodone-acetaminophen (NORCO/VICODIN) 5-325 MG per tablet 1 tab po tid prn pain 90 tablet 0  . lisinopril (PRINIVIL,ZESTRIL) 10 MG tablet Take 5 mg by mouth at bedtime. If b/p greater than 140/90 patient takes 10 mg po    . meloxicam (MOBIC) 15 MG tablet Take 1 tablet (15 mg total) by mouth daily. 30 tablet 6  . verapamil (CALAN-SR) 180 MG CR tablet Take 1 tablet (180 mg total) by mouth at bedtime. 90 tablet 1  . lidocaine (LIDODERM) 5 % Place 1 patch onto the skin daily. Remove & Discard patch within  12 hours or as directed by MD (Patient not taking: Reported on 08/29/2014) 30 patch 0  . zoster vaccine live, PF, (ZOSTAVAX) 46270 UNT/0.65ML injection Inject 19,400 Units into the skin once. (Patient not taking: Reported on 12/23/2014) 1 vial 0   No facility-administered medications prior to visit.    PE: Blood pressure 121/74, pulse 60, temperature 97.8 F (36.6 C), temperature source Oral, resp. rate 16, height 5\' 1"  (1.549 m), weight 140 lb (63.504 kg), last menstrual period 06/27/1972, SpO2 98 %. Gen: Alert, well appearing.  Patient is oriented to person, place, time, and situation. CV: RRR, no m/r/g.   LUNGS: CTA bilat, nonlabored resps, good aeration in all lung fields. EXT: no clubbing, cyanosis, or edema.   LABS:  Lab Results  Component Value Date   TSH 1.307 11/21/2013   Lab Results  Component Value Date   WBC 6.3 03/14/2014   HGB 11.1* 03/19/2014   HCT 32.7* 03/19/2014   MCV 86.1 03/14/2014   PLT 176 03/14/2014   Lab Results  Component Value Date   CREATININE 0.92 12/23/2014   BUN 13 12/23/2014   NA 137 12/23/2014   K 4.3 12/23/2014   CL 102 12/23/2014   CO2 28 12/23/2014   Lab Results  Component Value Date   ALT 22 06/16/2014   AST 24 06/16/2014  ALKPHOS 66 06/16/2014   BILITOT 0.9 06/16/2014   Lab Results  Component Value Date   CHOL 191 06/16/2014   Lab Results  Component Value Date   HDL 53.70 06/16/2014   Lab Results  Component Value Date   LDLCALC 126* 06/16/2014   Lab Results  Component Value Date   TRIG 59.0 06/16/2014   Lab Results  Component Value Date   CHOLHDL 4 06/16/2014     IMPRESSION AND PLAN:  1) HTN; well controlled.  The current medical regimen is effective;  continue present plan and medications. Lytes/cr today.  2) Fibromyalgia/anxiety/insomnia: regular use of xanax.  No signs of abuse or diversion. Uses meloxicam and vicodin with reasonable frequency for this pain as well as her episodic LBP, no adverse side  effects.    3) GERD: stable.  No scheduled med tx at this time.  Continue GERD diet/behavioral measures.  4) Preventative health care: prevnar 13 IM today.  An After Visit Summary was printed and given to the patient.  FOLLOW UP: 6 mo

## 2014-12-23 NOTE — Progress Notes (Signed)
Pre visit review using our clinic review tool, if applicable. No additional management support is needed unless otherwise documented below in the visit note. 

## 2014-12-29 ENCOUNTER — Encounter: Payer: Self-pay | Admitting: Family Medicine

## 2015-01-05 ENCOUNTER — Other Ambulatory Visit: Payer: Self-pay | Admitting: *Deleted

## 2015-01-05 MED ORDER — VERAPAMIL HCL ER 180 MG PO TBCR: 180.0000 mg | EXTENDED_RELEASE_TABLET | Freq: Every day | ORAL | Status: DC

## 2015-01-05 NOTE — Telephone Encounter (Signed)
RF request for Verapamil.  LOV: 12/23/14 Next ov: 06/03/15 Last written: 06/04/14 #90 w/ 3TT

## 2015-01-30 ENCOUNTER — Encounter: Payer: Self-pay | Admitting: Gynecology

## 2015-01-30 ENCOUNTER — Ambulatory Visit (INDEPENDENT_AMBULATORY_CARE_PROVIDER_SITE_OTHER): Payer: Medicare Other | Admitting: Gynecology

## 2015-01-30 VITALS — BP 118/76

## 2015-01-30 DIAGNOSIS — R102 Pelvic and perineal pain unspecified side: Secondary | ICD-10-CM

## 2015-01-30 DIAGNOSIS — N3000 Acute cystitis without hematuria: Secondary | ICD-10-CM | POA: Diagnosis not present

## 2015-01-30 DIAGNOSIS — B373 Candidiasis of vulva and vagina: Secondary | ICD-10-CM | POA: Diagnosis not present

## 2015-01-30 DIAGNOSIS — B3731 Acute candidiasis of vulva and vagina: Secondary | ICD-10-CM

## 2015-01-30 LAB — URINALYSIS W MICROSCOPIC + REFLEX CULTURE
Bilirubin Urine: NEGATIVE
Casts: NONE SEEN [LPF]
Crystals: NONE SEEN [HPF]
Glucose, UA: NEGATIVE
Hgb urine dipstick: NEGATIVE
Ketones, ur: NEGATIVE
Nitrite: NEGATIVE
Protein, ur: NEGATIVE
Specific Gravity, Urine: 1.015 (ref 1.001–1.035)
pH: 7 (ref 5.0–8.0)

## 2015-01-30 LAB — WET PREP FOR TRICH, YEAST, CLUE
Clue Cells Wet Prep HPF POC: NONE SEEN
Trich, Wet Prep: NONE SEEN

## 2015-01-30 MED ORDER — FLUCONAZOLE 150 MG PO TABS: 150.0000 mg | ORAL_TABLET | Freq: Once | ORAL | Status: DC

## 2015-01-30 MED ORDER — CIPROFLOXACIN HCL 250 MG PO TABS: 250.0000 mg | ORAL_TABLET | Freq: Two times a day (BID) | ORAL | Status: DC

## 2015-01-30 NOTE — Patient Instructions (Signed)
Take the one Diflucan pill. Take the ciprofloxacin twice daily for 7 days. Follow up if your symptoms persist, worsen or recur.

## 2015-01-30 NOTE — Addendum Note (Signed)
Addended by: Nelva Nay on: 01/30/2015 02:05 PM   Modules accepted: Orders

## 2015-01-30 NOTE — Progress Notes (Signed)
Ruvi Fullenwider January 01, 1946 [id number]        69 y.o.  [id number] presents complaining of lower pelvic/right sided discomfort. She had a fall where she hit her right side several months ago and has had some nagging discomfort. Most recently has noted some more persistent pelvic pain and vaginal burning. No dysuria urgency frequency. No nausea vomiting diarrhea constipation. No vaginal bleeding at all.  Past medical history,surgical history, problem list, medications, allergies, family history and social history were all reviewed and documented in the EPIC chart.  Directed ROS with pertinent positives and negatives documented in the history of present illness/assessment and plan.  Exam: [name] assistant Filed Vitals:   01/30/15 1213  BP: 118/76   General appearance:  Normal Spine straight without CVA tenderness. Abdomen soft nontender without masses guarding rebound Pelvic external BUS vagina with atrophic changes. Slight white discharge noted. Graft intact with good support of the vaginal walls. Some discomfort when I push on the graft on the right side.  No evidence of erosion or any palpable abnormalities.  Bimanual without masses or tenderness. Rectal exam is normal  Assessment/Plan:  69 y.o. [id number] with:  1. Right-sided discomfort. Feels some discomfort when I push on the graft on that side. I wonder if when she fell she pulled the tissues associated with the graft may have caused some more longer lasting discomfort. There are no palpable abnormalities. Recommend at this point just observation. If the discomfort continues she knows to call and we'll pursue more involved evaluation. Possibly related to #2. 2. Urinalysis is consistent with a UTI showing 6-10 WBC few bacteria. Will cover with ciprofloxacin 250 mg twice a day 7 days.  This may also account for her right-sided/pelvic discomfort. 3. Vaginal burning. Wet prep and urinalysis both show yeast. Will cover with Diflucan 150 mg 1 dose. Follow  up if symptoms persist or recur.    [name] MD, 12:36 PM 01/30/2015

## 2015-01-31 LAB — URINE CULTURE
Colony Count: NO GROWTH
Organism ID, Bacteria: NO GROWTH

## 2015-02-03 ENCOUNTER — Telehealth: Payer: Self-pay | Admitting: *Deleted

## 2015-02-03 MED ORDER — TERCONAZOLE 0.4 % VA CREA: 1.0000 | TOPICAL_CREAM | Freq: Every day | VAGINAL | Status: DC

## 2015-02-03 NOTE — Telephone Encounter (Signed)
Pt called to follow up from telephone encounter 01/30/15 states still having vaginal burning asked refill on yeast medication can be sent? diflucan 150 mg tablet was sent on OV. Please advise

## 2015-02-03 NOTE — Telephone Encounter (Signed)
I would recommend the Terazol seven-day cream nightly 7 nights. This will cover more yeast species and will give her some relief sooner.

## 2015-02-03 NOTE — Telephone Encounter (Signed)
Patient advised. Rx sent to pharmacy. 

## 2015-02-16 ENCOUNTER — Telehealth: Payer: Self-pay | Admitting: *Deleted

## 2015-02-16 MED ORDER — TERCONAZOLE 0.8 % VA CREA
1.0000 | TOPICAL_CREAM | Freq: Every day | VAGINAL | Status: DC
Start: 2015-02-16 — End: 2015-06-17

## 2015-02-16 NOTE — Telephone Encounter (Signed)
Sounds like she could benefit from a full week of the Terazol. Purchase another three days worth

## 2015-02-16 NOTE — Telephone Encounter (Signed)
(  Dr.Fontaine patient) pt called c/o vaginal burning, was treated with Terazol 7 day cream on 02/03/15. Pt said burning is somewhat better, but not fully gone. Pt taking care of her husband and asked if you know of something she can use OTC. Please advise

## 2015-02-16 NOTE — Telephone Encounter (Signed)
Rx sent, pt aware 

## 2015-03-09 ENCOUNTER — Other Ambulatory Visit: Payer: Self-pay | Admitting: *Deleted

## 2015-03-09 MED ORDER — LISINOPRIL 10 MG PO TABS: 5.0000 mg | ORAL_TABLET | Freq: Every day | ORAL | Status: DC

## 2015-03-09 NOTE — Telephone Encounter (Signed)
RF request for Lisinopril LOV: 12/23/14 Next ov: 06/03/15 Last written: unknown

## 2015-03-19 ENCOUNTER — Other Ambulatory Visit (HOSPITAL_COMMUNITY): Payer: Self-pay | Admitting: Gynecology

## 2015-03-19 ENCOUNTER — Other Ambulatory Visit: Payer: Self-pay | Admitting: *Deleted

## 2015-03-19 DIAGNOSIS — Z1231 Encounter for screening mammogram for malignant neoplasm of breast: Secondary | ICD-10-CM

## 2015-03-19 MED ORDER — FLUTICASONE PROPIONATE 50 MCG/ACT NA SUSP: 2.0000 | Freq: Every day | NASAL | Status: DC

## 2015-03-19 NOTE — Telephone Encounter (Signed)
RF request for fluticasone LOV: 12/23/14 Next ov: 06/03/15  Last written: unknown

## 2015-03-28 HISTORY — PX: ABDOMINAL HYSTERECTOMY: SHX81

## 2015-03-31 ENCOUNTER — Other Ambulatory Visit: Payer: Self-pay | Admitting: *Deleted

## 2015-03-31 NOTE — Telephone Encounter (Signed)
Pt LMOM on 03/31/15 at 12:45pm requesting refill to p/u today  Tried calling pt to let her know that I was unable to get this message due to VM PW being changed and I was not notified. NA, left message for pt to call back.   RF request for hydrocodone LOV: 12/23/14 Next ov: 06/03/15 Last written: 08/05/14 #90 w/ 0RF  Please advise. Thanks.

## 2015-04-01 MED ORDER — HYDROCODONE-ACETAMINOPHEN 5-325 MG PO TABS: ORAL_TABLET | ORAL | Status: DC

## 2015-04-01 NOTE — Telephone Encounter (Signed)
Patient's son came in to pick up father's Rx, he asked if his mother's was ready. I called the house and spoke with Alison Cuevas, he said he would try to come by later today. He gave verbal permission to release his Rx to his son.

## 2015-04-30 ENCOUNTER — Other Ambulatory Visit: Payer: Self-pay | Admitting: *Deleted

## 2015-04-30 DIAGNOSIS — H524 Presbyopia: Secondary | ICD-10-CM | POA: Diagnosis not present

## 2015-04-30 DIAGNOSIS — H2513 Age-related nuclear cataract, bilateral: Secondary | ICD-10-CM | POA: Diagnosis not present

## 2015-04-30 MED ORDER — LISINOPRIL 10 MG PO TABS: 5.0000 mg | ORAL_TABLET | Freq: Every day | ORAL | Status: DC

## 2015-04-30 NOTE — Telephone Encounter (Signed)
Pt LMOM on 04/30/15 at 2:52pm requesting 90 day supply of Lisinopril be sent to her pharmacy [facility]).  Rx sent for #90 w/ 3RF. Pt advised and voiced understanding.

## 2015-05-26 DIAGNOSIS — M7061 Trochanteric bursitis, right hip: Secondary | ICD-10-CM | POA: Diagnosis not present

## 2015-05-27 ENCOUNTER — Encounter: Payer: Self-pay | Admitting: Family Medicine

## 2015-06-01 ENCOUNTER — Other Ambulatory Visit: Payer: Self-pay | Admitting: *Deleted

## 2015-06-01 NOTE — Telephone Encounter (Signed)
RF request for alprazolam LOV: 12/23/14 Next ov: 06/19/15 Last written: 09/25/14 #90 w/ 5RF  Please advise. Thanks.

## 2015-06-02 MED ORDER — ALPRAZOLAM 0.5 MG PO TABS: 0.5000 mg | ORAL_TABLET | Freq: Three times a day (TID) | ORAL | Status: DC | PRN

## 2015-06-02 NOTE — Telephone Encounter (Signed)
Rx faxed

## 2015-06-03 ENCOUNTER — Encounter: Payer: Self-pay | Admitting: Family Medicine

## 2015-06-03 ENCOUNTER — Ambulatory Visit (INDEPENDENT_AMBULATORY_CARE_PROVIDER_SITE_OTHER): Payer: Medicare Other | Admitting: Family Medicine

## 2015-06-03 ENCOUNTER — Other Ambulatory Visit: Payer: Self-pay | Admitting: Family Medicine

## 2015-06-03 VITALS — BP 118/74 | HR 59 | Temp 97.8°F | Resp 16 | Ht 61.0 in | Wt 136.0 lb

## 2015-06-03 DIAGNOSIS — D649 Anemia, unspecified: Secondary | ICD-10-CM | POA: Diagnosis not present

## 2015-06-03 DIAGNOSIS — M545 Low back pain, unspecified: Secondary | ICD-10-CM

## 2015-06-03 DIAGNOSIS — I10 Essential (primary) hypertension: Secondary | ICD-10-CM

## 2015-06-03 DIAGNOSIS — F411 Generalized anxiety disorder: Secondary | ICD-10-CM

## 2015-06-03 DIAGNOSIS — R5382 Chronic fatigue, unspecified: Secondary | ICD-10-CM

## 2015-06-03 DIAGNOSIS — G8929 Other chronic pain: Secondary | ICD-10-CM

## 2015-06-03 DIAGNOSIS — M797 Fibromyalgia: Secondary | ICD-10-CM

## 2015-06-03 LAB — CBC
HCT: 41.7 % (ref 36.0–46.0)
Hemoglobin: 14.6 g/dL (ref 12.0–15.0)
MCH: 30.7 pg (ref 26.0–34.0)
MCHC: 35 g/dL (ref 30.0–36.0)
MCV: 87.8 fL (ref 78.0–100.0)
MPV: 10.3 fL (ref 8.6–12.4)
Platelets: 197 10*3/uL (ref 150–400)
RBC: 4.75 MIL/uL (ref 3.87–5.11)
RDW: 14.1 % (ref 11.5–15.5)
WBC: 7.8 10*3/uL (ref 4.0–10.5)

## 2015-06-03 LAB — TSH: TSH: 2.104 u[IU]/mL (ref 0.350–4.500)

## 2015-06-03 LAB — FERRITIN: Ferritin: 73 ng/mL (ref 10–291)

## 2015-06-03 MED ORDER — HYDROCODONE-ACETAMINOPHEN 5-325 MG PO TABS: ORAL_TABLET | ORAL | Status: DC

## 2015-06-03 NOTE — Progress Notes (Signed)
Pre visit review using our clinic review tool, if applicable. No additional management support is needed unless otherwise documented below in the visit note. 

## 2015-06-03 NOTE — Progress Notes (Signed)
OFFICE VISIT  06/03/2015   CC:  Chief Complaint  Patient presents with  . Follow-up    Pt is fasting.    HPI:    Patient is a 69 y.o. Caucasian female who presents for 6 mo f/u HTN, anxiety, fibromyalgia and episodic LBP. Widespread musculo pain a bit worse lately, esp with more activity required in taking care of debilitated husband.  Takes meloxicam qd and avg of 2 vicodin tabs per day (1/2-1 in daytime and 1/2 at night).  Recently got R hip bursitis injection by Dr. [name].  Home bp monitoring showing avg <140/90.  Compliant with bp meds w/o complaints of side effects.  Says anxiety fairly stable, takes xanax regularly.  Past Medical History  Diagnosis Date  . Allergic rhinitis     maple pollen  . Hypercholesteremia     no meds  . GERD (gastroesophageal reflux disease)   . Diverticulosis of colon   . DJD (degenerative joint disease)   . Anxiety   . Hypertension   . Osteopenia 05/2007    DEXA 2011 WNL  . Chronic low back pain     Dr. Trenton Gammon.  Has had back injection--bp elevated after.  . Phlebitis   . Varicose vein     left leg   . Herpes zoster 07/08/2014  . Trochanteric bursitis of right hip     Recurrent (injection by Dr. [name] 05/26/15)    Past Surgical History  Procedure Laterality Date  . Right hemicolectomy for diverticulitis with abscess  1993  . Abdominal hysterectomy      TAH  . Resection of colon      BENIGN TUMOR  . Tubal ligation    . Cholecystectomy, laparoscopic    . Cspine surgery      Dr. Wiliam Ke level ant cerv discectomy /fusion w/plating  . Cysto N/A 03/18/2014    Procedure: CYSTO;  Surgeon: [name], MD;  Location: [facility];  Service: Urology;  Laterality: N/A;  . Anterior and posterior repair N/A 03/18/2014    Procedure: Cystocele repair with graft, Vault suspension, Rectocele repair;  Surgeon: [name], MD;  Location: [facility];  Service: Urology;  Laterality: N/A;    Outpatient Prescriptions Prior to Visit  Medication  Sig Dispense Refill  . ALPRAZolam (XANAX) 0.5 MG tablet Take 1 tablet (0.5 mg total) by mouth 3 (three) times daily as needed for anxiety. 90 tablet 5  . aspirin 81 MG tablet Take 81 mg by mouth daily.    Marland Kitchen docusate sodium (COLACE) 100 MG capsule Take 1 capsule (100 mg total) by mouth 2 (two) times daily. 20 capsule 0  . estradiol (ESTRACE VAGINAL) 0.1 MG/GM vaginal cream Apply 1/4 applicator 3 times weekly vaginally. 42.5 g 2  . fexofenadine (ALLEGRA) 60 MG tablet Take 1 tablet (60 mg total) by mouth 2 (two) times daily. 60 tablet 6  . fluticasone (FLONASE) 50 MCG/ACT nasal spray Place 2 sprays into both nostrils at bedtime. 16 g 3  . lisinopril (PRINIVIL,ZESTRIL) 10 MG tablet Take 0.5 tablets (5 mg total) by mouth at bedtime. If b/p greater than 140/90 patient takes 10 mg po 90 tablet 3  . meloxicam (MOBIC) 15 MG tablet Take 1 tablet (15 mg total) by mouth daily. 30 tablet 6  . terconazole (TERAZOL 3) 0.8 % vaginal cream Place 1 applicator vaginally at bedtime. 20 g 0  . verapamil (CALAN-SR) 180 MG CR tablet Take 1 tablet (180 mg total) by mouth at bedtime. 90 tablet 1  .  HYDROcodone-acetaminophen (NORCO/VICODIN) 5-325 MG tablet 1 tab po tid prn pain 90 tablet 0  . ciprofloxacin (CIPRO) 250 MG tablet Take 1 tablet (250 mg total) by mouth 2 (two) times daily. For 7 days (Patient not taking: Reported on 06/03/2015) 14 tablet 0  . fluconazole (DIFLUCAN) 150 MG tablet Take 1 tablet (150 mg total) by mouth once. (Patient not taking: Reported on 06/03/2015) 1 tablet 0   No facility-administered medications prior to visit.    Allergies  Allergen Reactions  . Prednisone     REACTION: funny feeling    ROS As per HPI  PE: Blood pressure 118/74, pulse 59, temperature 97.8 F (36.6 C), temperature source Oral, resp. rate 16, height 5\' 1"  (1.549 m), weight 136 lb (61.689 kg), last menstrual period 06/27/1972, SpO2 97 %. Gen: Alert, well appearing.  Patient is oriented to person, place, time, and  situation. CV: RRR, no m/r/g.   LUNGS: CTA bilat, nonlabored resps, good aeration in all lung fields. EXT: no clubbing, cyanosis, or edema.    LABS:  Lab Results  Component Value Date   TSH 1.307 11/21/2013   Lab Results  Component Value Date   WBC 6.3 03/14/2014   HGB 11.1* 03/19/2014   HCT 32.7* 03/19/2014   MCV 86.1 03/14/2014   PLT 176 03/14/2014   Lab Results  Component Value Date   CREATININE 0.92 12/23/2014   BUN 13 12/23/2014   NA 137 12/23/2014   K 4.3 12/23/2014   CL 102 12/23/2014   CO2 28 12/23/2014   Lab Results  Component Value Date   ALT 22 06/16/2014   AST 24 06/16/2014   ALKPHOS 66 06/16/2014   BILITOT 0.9 06/16/2014   Lab Results  Component Value Date   CHOL 191 06/16/2014   Lab Results  Component Value Date   HDL 53.70 06/16/2014   Lab Results  Component Value Date   LDLCALC 126* 06/16/2014   Lab Results  Component Value Date   TRIG 59.0 06/16/2014   Lab Results  Component Value Date   CHOLHDL 4 06/16/2014   IMPRESSION AND PLAN:  1) HTN: The current medical regimen is effective;  continue present plan and medications. Lytes/Cr check today.  2) Chronic fatigue: will check CBC again (hx of mild normocytic anemia), iron studies/ferritin, and TSH.  3) Fibromyalgia pain+episodic LBP: having a mild flare lately but seems to hold off from taking excessive pain meds.  Takes a meloxicam once a day.  Vicodin about 2-3 tabs per day max--new rx for this med printed for pt today.  4) Anxiety: The current medical regimen is effective;  continue present plan and medications.  An After Visit Summary was printed and given to the patient.  FOLLOW UP: Return in about 6 months (around 12/02/2015) for routine chronic illness f/u.

## 2015-06-05 ENCOUNTER — Other Ambulatory Visit: Payer: Self-pay | Admitting: *Deleted

## 2015-06-05 ENCOUNTER — Encounter: Payer: Self-pay | Admitting: Family Medicine

## 2015-06-05 DIAGNOSIS — E7800 Pure hypercholesterolemia, unspecified: Secondary | ICD-10-CM

## 2015-06-05 LAB — BASIC METABOLIC PANEL
BUN: 21 mg/dL (ref 7–25)
CO2: 26 mmol/L (ref 20–31)
Calcium: 10.1 mg/dL (ref 8.6–10.4)
Chloride: 99 mmol/L (ref 98–110)
Creat: 0.87 mg/dL (ref 0.60–0.93)
Glucose, Bld: 89 mg/dL (ref 65–99)
Potassium: 4.7 mmol/L (ref 3.5–5.3)
Sodium: 135 mmol/L (ref 135–146)

## 2015-06-05 LAB — IRON AND TIBC
%SAT: 62 % — ABNORMAL HIGH (ref 11–50)
Iron: 199 ug/dL — ABNORMAL HIGH (ref 45–160)
TIBC: 323 ug/dL (ref 250–450)
UIBC: 124 ug/dL — ABNORMAL LOW (ref 125–400)

## 2015-06-08 ENCOUNTER — Other Ambulatory Visit: Payer: Self-pay | Admitting: Family Medicine

## 2015-06-08 DIAGNOSIS — E785 Hyperlipidemia, unspecified: Secondary | ICD-10-CM

## 2015-06-08 LAB — LIPID PANEL

## 2015-06-09 ENCOUNTER — Encounter: Payer: Self-pay | Admitting: Family Medicine

## 2015-06-12 ENCOUNTER — Other Ambulatory Visit: Payer: Self-pay | Admitting: *Deleted

## 2015-06-17 ENCOUNTER — Other Ambulatory Visit (HOSPITAL_COMMUNITY)
Admission: RE | Admit: 2015-06-17 | Discharge: 2015-06-17 | Disposition: A | Discharge status: Home / Self Care | Payer: Medicare Other | Source: Ambulatory Visit | Attending: Gynecology | Admitting: Gynecology

## 2015-06-17 ENCOUNTER — Ambulatory Visit (INDEPENDENT_AMBULATORY_CARE_PROVIDER_SITE_OTHER): Payer: Medicare Other | Admitting: Gynecology

## 2015-06-17 ENCOUNTER — Encounter: Payer: Self-pay | Admitting: Gynecology

## 2015-06-17 VITALS — BP 114/74 | Ht 61.0 in | Wt 138.0 lb

## 2015-06-17 DIAGNOSIS — Z01419 Encounter for gynecological examination (general) (routine) without abnormal findings: Secondary | ICD-10-CM

## 2015-06-17 DIAGNOSIS — Z1272 Encounter for screening for malignant neoplasm of vagina: Secondary | ICD-10-CM

## 2015-06-17 DIAGNOSIS — Z124 Encounter for screening for malignant neoplasm of cervix: Secondary | ICD-10-CM | POA: Diagnosis not present

## 2015-06-17 DIAGNOSIS — N952 Postmenopausal atrophic vaginitis: Secondary | ICD-10-CM

## 2015-06-17 DIAGNOSIS — R8279 Other abnormal findings on microbiological examination of urine: Secondary | ICD-10-CM | POA: Diagnosis not present

## 2015-06-17 NOTE — Progress Notes (Signed)
Alison Cuevas [birth year] [id number]        69 y.o.  [phone]  for breast and pelvic exam  Past medical history,surgical history, problem list, medications, allergies, family history and social history were all reviewed and documented as reviewed in the EPIC chart.  ROS:  Performed with pertinent positives and negatives included in the history, assessment and plan.   Additional significant findings :  none   Exam: [name] Counsellor Vitals:   06/17/15 1052  BP: 114/74  Height: 5\' 1"  (1.549 m)  Weight: 138 lb (62.596 kg)   General appearance:  Normal affect, orientation and appearance. Skin: Grossly normal HEENT: Without gross lesions.  No cervical or supraclavicular adenopathy. Thyroid normal.  Lungs:  Clear without wheezing, rales or rhonchi Cardiac: RR, without RMG Abdominal:  Soft, nontender, without masses, guarding, rebound, organomegaly or hernia Breasts:  Examined lying and sitting without masses, retractions, discharge or axillary adenopathy. Pelvic:  Ext/BUS/vagina with atrophic changes. Cuff well supported. No significant cystocele or rectocele.  Adnexa  Without masses or tenderness    Anus and perineum  Normal   Rectovaginal  Normal sphincter tone without palpated masses or tenderness.    Assessment/Plan:  69 y.o. [id number] female for breast and pelvic exam.   1. Postmenopausal/atrophic genital changes. Status post TAH in the past. Status post anterior and posterior repair with Dr. Matilde Sprang with graft. Patient doing well with good support. No significant symptoms from her atrophic vaginitis. Continue to monitor and report any issues. 2. Osteopenia. History of osteopenia on 2008 DEXA T score -1.2. Follow up study 2011 normal. Recommend DEXA now at 5 year interval. Increased calcium vitamin D reviewed. 3. Mammography scheduled next week. Continue with annual mammography when due. SBE monthly reviewed. 4. Colonoscopy 7 years ago with reported repeat interval 10  years. 5. Pap smear 2013. Pap smear done today. Patient was uncomfortable with less frequent screening intervals or stopping altogether as she is status post hysterectomy and age 48. 2. Health maintenance. No routine blood work done as this is done at her primary physician's office. Follow up for DEXA otherwise1 year, sooner as needed.   [name] MD, 11:14 AM 06/17/2015

## 2015-06-17 NOTE — Addendum Note (Signed)
Addended by: Nelva Nay on: 06/17/2015 11:45 AM   Modules accepted: Orders

## 2015-06-17 NOTE — Patient Instructions (Signed)
Follow up for bone density as scheduled.  You may obtain a copy of any labs that were done today by logging onto MyChart as outlined in the instructions provided with your AVS (after visit summary). The office will not call with normal lab results but certainly if there are any significant abnormalities then we will contact you.   Health Maintenance Adopting a healthy lifestyle and getting preventive care can go a long way to promote health and wellness. Talk with your health care provider about what schedule of regular examinations is right for you. This is a good chance for you to check in with your provider about disease prevention and staying healthy. In between checkups, there are plenty of things you can do on your own. Experts have done a lot of research about which lifestyle changes and preventive measures are most likely to keep you healthy. Ask your health care provider for more information. WEIGHT AND DIET  Eat a healthy diet  Be sure to include plenty of vegetables, fruits, low-fat dairy products, and lean protein.  Do not eat a lot of foods high in solid fats, added sugars, or salt.  Get regular exercise. This is one of the most important things you can do for your health.  Most adults should exercise for at least 150 minutes each week. The exercise should increase your heart rate and make you sweat (moderate-intensity exercise).  Most adults should also do strengthening exercises at least twice a week. This is in addition to the moderate-intensity exercise.  Maintain a healthy weight  Body mass index (BMI) is a measurement that can be used to identify possible weight problems. It estimates body fat based on height and weight. Your health care provider can help determine your BMI and help you achieve or maintain a healthy weight.  For females 69 years of age and older:   A BMI below 18.5 is considered underweight.  A BMI of 18.5 to 24.9 is normal.  A BMI of 25 to 29.9 is  considered overweight.  A BMI of 30 and above is considered obese.  Watch levels of cholesterol and blood lipids  You should start having your blood tested for lipids and cholesterol at 70 years of age, then have this test every 5 years.  You may need to have your cholesterol levels checked more often if:  Your lipid or cholesterol levels are high.  You are older than 70 years of age.  You are at high risk for heart disease.  CANCER SCREENING   Lung Cancer  Lung cancer screening is recommended for adults 30-82 years old who are at high risk for lung cancer because of a history of smoking.  A yearly low-dose CT scan of the lungs is recommended for people who:  Currently smoke.  Have quit within the past 15 years.  Have at least a 30-pack-year history of smoking. A pack year is smoking an average of one pack of cigarettes a day for 1 year.  Yearly screening should continue until it has been 15 years since you quit.  Yearly screening should stop if you develop a health problem that would prevent you from having lung cancer treatment.  Breast Cancer  Practice breast self-awareness. This means understanding how your breasts normally appear and feel.  It also means doing regular breast self-exams. Let your health care provider know about any changes, no matter how small.  If you are in your 20s or 30s, you should have a clinical breast exam (  CBE) by a health care provider every 1-3 years as part of a regular health exam.  If you are 56 or older, have a CBE every year. Also consider having a breast X-ray (mammogram) every year.  If you have a family history of breast cancer, talk to your health care provider about genetic screening.  If you are at high risk for breast cancer, talk to your health care provider about having an MRI and a mammogram every year.  Breast cancer gene (BRCA) assessment is recommended for women who have family members with BRCA-related cancers.  BRCA-related cancers include:  Breast.  Ovarian.  Tubal.  Peritoneal cancers.  Results of the assessment will determine the need for genetic counseling and BRCA1 and BRCA2 testing. Cervical Cancer Routine pelvic examinations to screen for cervical cancer are no longer recommended for nonpregnant women who are considered low risk for cancer of the pelvic organs (ovaries, uterus, and vagina) and who do not have symptoms. A pelvic examination may be necessary if you have symptoms including those associated with pelvic infections. Ask your health care provider if a screening pelvic exam is right for you.   The Pap test is the screening test for cervical cancer for women who are considered at risk.  If you had a hysterectomy for a problem that was not cancer or a condition that could lead to cancer, then you no longer need Pap tests.  If you are older than 65 years, and you have had normal Pap tests for the past 10 years, you no longer need to have Pap tests.  If you have had past treatment for cervical cancer or a condition that could lead to cancer, you need Pap tests and screening for cancer for at least 20 years after your treatment.  If you no longer get a Pap test, assess your risk factors if they change (such as having a new sexual partner). This can affect whether you should start being screened again.  Some women have medical problems that increase their chance of getting cervical cancer. If this is the case for you, your health care provider may recommend more frequent screening and Pap tests.  The human papillomavirus (HPV) test is another test that may be used for cervical cancer screening. The HPV test looks for the virus that can cause cell changes in the cervix. The cells collected during the Pap test can be tested for HPV.  The HPV test can be used to screen women 39 years of age and older. Getting tested for HPV can extend the interval between normal Pap tests from three to  five years.  An HPV test also should be used to screen women of any age who have unclear Pap test results.  After 70 years of age, women should have HPV testing as often as Pap tests.  Colorectal Cancer  This type of cancer can be detected and often prevented.  Routine colorectal cancer screening usually begins at 70 years of age and continues through 70 years of age.  Your health care provider may recommend screening at an earlier age if you have risk factors for colon cancer.  Your health care provider may also recommend using home test kits to check for hidden blood in the stool.  A small camera at the end of a tube can be used to examine your colon directly (sigmoidoscopy or colonoscopy). This is done to check for the earliest forms of colorectal cancer.  Routine screening usually begins at age 77.  Direct examination of the colon should be repeated every 5-10 years through 70 years of age. However, you may need to be screened more often if early forms of precancerous polyps or small growths are found. Skin Cancer  Check your skin from head to toe regularly.  Tell your health care provider about any new moles or changes in moles, especially if there is a change in a mole's shape or color.  Also tell your health care provider if you have a mole that is larger than the size of a pencil eraser.  Always use sunscreen. Apply sunscreen liberally and repeatedly throughout the day.  Protect yourself by wearing long sleeves, pants, a wide-brimmed hat, and sunglasses whenever you are outside. HEART DISEASE, DIABETES, AND HIGH BLOOD PRESSURE   Have your blood pressure checked at least every 1-2 years. High blood pressure causes heart disease and increases the risk of stroke.  If you are between 76 years and 55 years old, ask your health care provider if you should take aspirin to prevent strokes.  Have regular diabetes screenings. This involves taking a blood sample to check your  fasting blood sugar level.  If you are at a normal weight and have a low risk for diabetes, have this test once every three years after 70 years of age.  If you are overweight and have a high risk for diabetes, consider being tested at a younger age or more often. PREVENTING INFECTION  Hepatitis B  If you have a higher risk for hepatitis B, you should be screened for this virus. You are considered at high risk for hepatitis B if:  You were born in a country where hepatitis B is common. Ask your health care provider which countries are considered high risk.  Your parents were born in a high-risk country, and you have not been immunized against hepatitis B (hepatitis B vaccine).  You have HIV or AIDS.  You use needles to inject street drugs.  You live with someone who has hepatitis B.  You have had sex with someone who has hepatitis B.  You get hemodialysis treatment.  You take certain medicines for conditions, including cancer, organ transplantation, and autoimmune conditions. Hepatitis C  Blood testing is recommended for:  Everyone born from [birth year] through 1966.  Anyone with known risk factors for hepatitis C. Sexually transmitted infections (STIs)  You should be screened for sexually transmitted infections (STIs) including gonorrhea and chlamydia if:  You are sexually active and are younger than 70 years of age.  You are older than 70 years of age and your health care provider tells you that you are at risk for this type of infection.  Your sexual activity has changed since you were last screened and you are at an increased risk for chlamydia or gonorrhea. Ask your health care provider if you are at risk.  If you do not have HIV, but are at risk, it may be recommended that you take a prescription medicine daily to prevent HIV infection. This is called pre-exposure prophylaxis (PrEP). You are considered at risk if:  You are sexually active and do not regularly use condoms or  know the HIV status of your partner(s).  You take drugs by injection.  You are sexually active with a partner who has HIV. Talk with your health care provider about whether you are at high risk of being infected with HIV. If you choose to begin PrEP, you should first be tested for HIV. You should then be  tested every 3 months for as long as you are taking PrEP.  PREGNANCY   If you are premenopausal and you may become pregnant, ask your health care provider about preconception counseling.  If you may become pregnant, take 400 to 800 micrograms (mcg) of folic acid every day.  If you want to prevent pregnancy, talk to your health care provider about birth control (contraception). OSTEOPOROSIS AND MENOPAUSE   Osteoporosis is a disease in which the bones lose minerals and strength with aging. This can result in serious bone fractures. Your risk for osteoporosis can be identified using a bone density scan.  If you are 25 years of age or older, or if you are at risk for osteoporosis and fractures, ask your health care provider if you should be screened.  Ask your health care provider whether you should take a calcium or vitamin D supplement to lower your risk for osteoporosis.  Menopause may have certain physical symptoms and risks.  Hormone replacement therapy may reduce some of these symptoms and risks. Talk to your health care provider about whether hormone replacement therapy is right for you.  HOME CARE INSTRUCTIONS   Schedule regular health, dental, and eye exams.  Stay current with your immunizations.   Do not use any tobacco products including cigarettes, chewing tobacco, or electronic cigarettes.  If you are pregnant, do not drink alcohol.  If you are breastfeeding, limit how much and how often you drink alcohol.  Limit alcohol intake to no more than 1 drink per day for nonpregnant women. One drink equals 12 ounces of beer, 5 ounces of wine, or 1 ounces of hard liquor.  Do  not use street drugs.  Do not share needles.  Ask your health care provider for help if you need support or information about quitting drugs.  Tell your health care provider if you often feel depressed.  Tell your health care provider if you have ever been abused or do not feel safe at home. Document Released: 12/27/2010 Document Revised: 10/28/2013 Document Reviewed: 05/15/2013 [facility] Patient Information 2015 [place], [place]. This information is not intended to replace advice given to you by your health care provider. Make sure you discuss any questions you have with your health care provider.

## 2015-06-18 ENCOUNTER — Ambulatory Visit (HOSPITAL_COMMUNITY)
Admission: RE | Admit: 2015-06-18 | Discharge: 2015-06-18 | Disposition: A | Discharge status: Home / Self Care | Payer: Medicare Other | Source: Ambulatory Visit | Attending: Gynecology | Admitting: Gynecology

## 2015-06-18 DIAGNOSIS — Z1231 Encounter for screening mammogram for malignant neoplasm of breast: Secondary | ICD-10-CM | POA: Diagnosis not present

## 2015-06-18 LAB — URINALYSIS W MICROSCOPIC + REFLEX CULTURE
Bacteria, UA: NONE SEEN [HPF]
Bilirubin Urine: NEGATIVE
Casts: NONE SEEN [LPF]
Crystals: NONE SEEN [HPF]
Glucose, UA: NEGATIVE
Hgb urine dipstick: NEGATIVE
Ketones, ur: NEGATIVE
Nitrite: NEGATIVE
Protein, ur: NEGATIVE
RBC / HPF: NONE SEEN RBC/HPF (ref <=2)
Specific Gravity, Urine: 1.006 (ref 1.001–1.035)
Squamous Epithelial / LPF: NONE SEEN [HPF] (ref <=5)
WBC, UA: NONE SEEN WBC/HPF (ref <=5)
Yeast: NONE SEEN [HPF]
pH: 6 (ref 5.0–8.0)

## 2015-06-18 LAB — CYTOLOGY - PAP

## 2015-06-19 ENCOUNTER — Encounter: Payer: Medicare Other | Admitting: Gynecology

## 2015-06-19 ENCOUNTER — Ambulatory Visit (HOSPITAL_COMMUNITY): Payer: Medicare Other

## 2015-06-19 LAB — URINE CULTURE
Colony Count: NO GROWTH
Organism ID, Bacteria: NO GROWTH

## 2015-06-23 ENCOUNTER — Telehealth: Payer: Self-pay | Admitting: *Deleted

## 2015-06-23 NOTE — Telephone Encounter (Signed)
Based on what she briefly told me in office today, antibiotics are not indicated. She simply complained of nasal dryness/burning sensation. Pls collect more history/symptoms if there are any and then I'll decide if her request is more appropriate.-thx

## 2015-06-23 NOTE — Telephone Encounter (Signed)
Pt called stating that she picked up that nose ointment but her nose is still burning and she would like to have a zpack called in. Please advise. Thanks.

## 2015-06-24 NOTE — Telephone Encounter (Signed)
Noted  

## 2015-06-24 NOTE — Telephone Encounter (Signed)
Spoke to pt she stated that her main symptoms was nose burning and bleeding. She stated that she feels better today then she did yesterday and has decided to give the nasal spray a few more days to see if it will clear up with that, if not she will call back.

## 2015-06-28 DIAGNOSIS — N83209 Unspecified ovarian cyst, unspecified side: Secondary | ICD-10-CM

## 2015-06-28 HISTORY — DX: Unspecified ovarian cyst, unspecified side: N83.209

## 2015-07-02 ENCOUNTER — Other Ambulatory Visit: Payer: Self-pay | Admitting: *Deleted

## 2015-07-02 MED ORDER — VERAPAMIL HCL ER 180 MG PO TBCR: 180.0000 mg | EXTENDED_RELEASE_TABLET | Freq: Every day | ORAL | Status: DC

## 2015-07-02 NOTE — Telephone Encounter (Signed)
Pt LMOM on 07/02/15 at 8:19am requesting refills.   RF request for verapamil LOV: 06/03/15 Next ov: 12/02/15 Last written: 01/05/15 #90 w/ 1RF

## 2015-07-29 DIAGNOSIS — M8580 Other specified disorders of bone density and structure, unspecified site: Secondary | ICD-10-CM

## 2015-07-29 HISTORY — DX: Other specified disorders of bone density and structure, unspecified site: M85.80

## 2015-07-30 ENCOUNTER — Ambulatory Visit (INDEPENDENT_AMBULATORY_CARE_PROVIDER_SITE_OTHER): Payer: Medicare Other

## 2015-07-30 ENCOUNTER — Other Ambulatory Visit: Payer: Self-pay | Admitting: Gynecology

## 2015-07-30 ENCOUNTER — Encounter: Payer: Self-pay | Admitting: Gynecology

## 2015-07-30 DIAGNOSIS — M8580 Other specified disorders of bone density and structure, unspecified site: Secondary | ICD-10-CM

## 2015-07-30 DIAGNOSIS — M899 Disorder of bone, unspecified: Secondary | ICD-10-CM | POA: Diagnosis not present

## 2015-07-30 DIAGNOSIS — Z01419 Encounter for gynecological examination (general) (routine) without abnormal findings: Secondary | ICD-10-CM

## 2015-08-03 ENCOUNTER — Telehealth: Payer: Self-pay | Admitting: Family Medicine

## 2015-08-03 NOTE — Telephone Encounter (Signed)
Tried calling pt number is busy.

## 2015-08-03 NOTE — Telephone Encounter (Signed)
Patient is not getting better. She has tried several OTC medications. Can a Zpac be sent to her pharmacy?

## 2015-08-04 MED ORDER — AZITHROMYCIN 250 MG PO TABS: ORAL_TABLET | ORAL | Status: DC

## 2015-08-04 NOTE — Telephone Encounter (Signed)
Spoke to pt she stated that she is having sinus trouble. She stated that she is having nose bleeds and nasal congestion/stuffy. She stated that Dr. Anitra Lauth looked at this once when her husband was being seen but she has not had an apt for this. She stated that Dr. Lenna Gilford use to prescribe her a zpack for this. Please advise. Thanks.

## 2015-08-04 NOTE — Telephone Encounter (Signed)
Generic Z-pack eRx'd as per pt request. If pt not significantly improved in 10 days then she should make appt to be seen for this.-thx

## 2015-08-04 NOTE — Telephone Encounter (Signed)
Pt advised and voiced understanding.   

## 2015-08-18 ENCOUNTER — Telehealth: Payer: Self-pay

## 2015-08-18 MED ORDER — MELOXICAM 15 MG PO TABS: ORAL_TABLET | ORAL | Status: DC

## 2015-08-18 NOTE — Telephone Encounter (Signed)
[facility] Meloxicam 15 mg tablet. #90 and 1 refill requested

## 2015-08-18 NOTE — Telephone Encounter (Signed)
OK, meloxicam eRx'd per pt request.

## 2015-08-24 ENCOUNTER — Telehealth: Payer: Self-pay | Admitting: Family Medicine

## 2015-08-24 ENCOUNTER — Encounter: Payer: Self-pay | Admitting: Family Medicine

## 2015-08-24 ENCOUNTER — Ambulatory Visit (INDEPENDENT_AMBULATORY_CARE_PROVIDER_SITE_OTHER): Payer: Medicare Other | Admitting: Family Medicine

## 2015-08-24 VITALS — BP 131/81 | HR 54 | Temp 97.8°F | Resp 16 | Ht 60.5 in | Wt 140.5 lb

## 2015-08-24 DIAGNOSIS — Z0000 Encounter for general adult medical examination without abnormal findings: Secondary | ICD-10-CM

## 2015-08-24 NOTE — Progress Notes (Signed)
The patient is here for annual Medicare wellness examination and management of other chronic and acute problems. Other problems discussed today: see the very bottom of this note.   AWV DATA The risk factors are reflected in the social history.  The roster of all physicians providing medical care to patient is listed in the Snapshot section of the chart.  Activities of daily living:  The patient is 100% independent in all ADLs: dressing, toileting, feeding as well as independent mobility.  Home safety : The patient has smoke detectors in the home. They wear seatbelts. No firearms at home ( firearms are present in the home, kept in a safe fashion). There is no violence in the home.   There is no risks for hepatitis, STDs or HIV. There is no history of blood transfusion. They have no travel history to infectious disease endemic areas of the world.  The patient has seen their dentist in the last six months--goes frequently for periodontal disease. They have seen their eye doctor in the last year--this was a good report. They deny any hearing difficulty and have not had audiologic testing in the last year.  They do not  have excessive sun exposure. Discussed the need for sun protection: hats, long sleeves and use of sunscreen if there is significant sun exposure.   Diet: the importance of a healthy diet is discussed. They do have a healthy (unhealthy-high fat/fast food) diet.  The patient does not have a regular exercise regimen.  The benefits of regular aerobic exercise were discussed.  Depression screen: there are no signs or vegative symptoms of depression- irritability, change in appetite, anhedonia, sadness/tearfullness.  Cognitive assessment: the patient manages all their financial and personal affairs and is actively engaged. They could relate day,date,year and events; recalled 3/3 objects at 3 minutes; performed clock-face test normally.  The following portions of the patient's history  were reviewed and updated as appropriate: allergies, current medications, past family history, past medical history,  past surgical history, past social history  and problem list.  Vision, hearing, body mass index were assessed and reviewed.   During the course of the visit the patient was educated and counseled about appropriate screening and preventive services including :  Annual wellness visit --done today. diabetes screening--done 05/2015 (normal fasting glucose). colorectal cancer screening: due for repeat colonoscopy 05/2016. recommended immunizations (influenza, pneumococcal, Hep B): Pt UTD on flu and pneumonia vaccines.  Hep B vaccine not needed. Bone mass measurement: done 07/2015, osteopenic.  Repeat DEXA planned 07/2017. Counseling to prevent tobacco use-NA Depression screening--done today. Glaucoma screening: done via her eye MD. Hepatitis C virus screening: NA HIV virus screening: NA Lung cancer screening: NA Medical nutrition therapy: NA Prostate cancer screening: NA Screening mammography: UTD via her GYN, Dr. Clovis Riley. Screening pap tests, pelvic exam, and clinical breast exam:  UTD via her GYN, Dr. Clovis Riley. Ultrasound screening for AAA: NA  A written plan of action regarding the above screening and preventative services was given to the patient today.  At the end of today's visit, she said that she has a family member with hx of MGUS and she states that Dr. Marin Olp recommended other people in the family be screened with SPEP.  We decided to do this the next time she is here for f/u and needs blood work.  Signed:  Crissie Sickles, MD           08/25/2015

## 2015-08-24 NOTE — Telephone Encounter (Signed)
FYI

## 2015-08-24 NOTE — Progress Notes (Signed)
Pre visit review using our clinic review tool, if applicable. No additional management support is needed unless otherwise documented below in the visit note. 

## 2015-08-24 NOTE — Telephone Encounter (Signed)
Patient called to let you know she is due for her colonoscopy 11/17 with Dr. [name].

## 2015-08-24 NOTE — Telephone Encounter (Signed)
Noted. Chart updated.

## 2015-08-25 ENCOUNTER — Encounter: Payer: Self-pay | Admitting: Family Medicine

## 2015-09-18 ENCOUNTER — Encounter: Payer: Self-pay | Admitting: Gynecology

## 2015-09-18 ENCOUNTER — Ambulatory Visit (INDEPENDENT_AMBULATORY_CARE_PROVIDER_SITE_OTHER): Payer: Medicare Other | Admitting: Gynecology

## 2015-09-18 VITALS — BP 130/74

## 2015-09-18 DIAGNOSIS — N952 Postmenopausal atrophic vaginitis: Secondary | ICD-10-CM | POA: Diagnosis not present

## 2015-09-18 DIAGNOSIS — N3000 Acute cystitis without hematuria: Secondary | ICD-10-CM | POA: Diagnosis not present

## 2015-09-18 DIAGNOSIS — N898 Other specified noninflammatory disorders of vagina: Secondary | ICD-10-CM

## 2015-09-18 DIAGNOSIS — N8111 Cystocele, midline: Secondary | ICD-10-CM

## 2015-09-18 LAB — WET PREP FOR TRICH, YEAST, CLUE
Clue Cells Wet Prep HPF POC: NONE SEEN
Trich, Wet Prep: NONE SEEN
Yeast Wet Prep HPF POC: NONE SEEN

## 2015-09-18 LAB — URINALYSIS W MICROSCOPIC + REFLEX CULTURE
Bilirubin Urine: NEGATIVE
Casts: NONE SEEN [LPF]
Crystals: NONE SEEN [HPF]
Glucose, UA: NEGATIVE
Hgb urine dipstick: NEGATIVE
Ketones, ur: NEGATIVE
Nitrite: NEGATIVE
Protein, ur: NEGATIVE
RBC / HPF: NONE SEEN RBC/HPF (ref <=2)
Specific Gravity, Urine: 1.01 (ref 1.001–1.035)
Yeast: NONE SEEN [HPF]
pH: 6 (ref 5.0–8.0)

## 2015-09-18 MED ORDER — NITROFURANTOIN MONOHYD MACRO 100 MG PO CAPS: 100.0000 mg | ORAL_CAPSULE | Freq: Two times a day (BID) | ORAL | Status: DC

## 2015-09-18 NOTE — Patient Instructions (Signed)
Take antibiotic pill twice daily. Follow up if your symptoms persist or recur.

## 2015-09-18 NOTE — Addendum Note (Signed)
Addended by: Nelva Nay on: 09/18/2015 10:49 AM   Modules accepted: Orders

## 2015-09-18 NOTE — Progress Notes (Signed)
    Alison Cuevas Sep 24, 1945 [id number]        70 y.o.  [id number] Presents with several days of vaginal discharge and odor.  Notes that her urine is a little darker than usual. No frequency dysuria or urgency. No significant low back pain fever chills nausea vomiting diarrhea constipation. Is wearing pads daily due to some fecal incontinence.  Past medical history,surgical history, problem list, medications, allergies, family history and social history were all reviewed and documented in the EPIC chart.  Directed ROS with pertinent positives and negatives documented in the history of present illness/assessment and plan.  Exam: Alison Cuevas assistant Filed Vitals:   09/18/15 0936  BP: 130/74   General appearance:  Normal Spine straight without CVA tenderness Abdomen soft nontender without masses guarding rebound Pelvic external BS vagina with atrophic changes. Mild cystocele noted. Cuff well supported. Scant discharge. Bimanual without masses or tenderness  Assessment/Plan:  70 y.o. [id number] with above history and exam. Wet prep is negative. Urinalysis consistent with UTI with 40-60 WBC and many bacteria. Will follow up with culture. Will cover with Macrobid 100 mg twice a day 7 days. Patient will follow up if her symptoms persist, worsen or recur.    [name] MD, 9:58 AM 09/18/2015

## 2015-09-19 LAB — URINE CULTURE: Colony Count: 30000

## 2015-09-24 ENCOUNTER — Telehealth: Payer: Self-pay | Admitting: *Deleted

## 2015-09-24 MED ORDER — FLUCONAZOLE 150 MG PO TABS: 150.0000 mg | ORAL_TABLET | Freq: Once | ORAL | Status: DC

## 2015-09-24 NOTE — Telephone Encounter (Signed)
Pt informed with the below note, pt is not taking cholesterol medication. Rx sent.

## 2015-09-24 NOTE — Telephone Encounter (Signed)
Okay for Diflucan 150 mg 1 dose. If she does take the medicine remind her to hold her cholesterol medication if she is taking any

## 2015-09-24 NOTE — Telephone Encounter (Signed)
Pt was placed on Macrobid 100 mg x 7 days on 09/18/15 asked if diflucan tablet can be sent states she always get yeast infection after taking antibiotic. Please advise

## 2015-10-14 DIAGNOSIS — Z0000 Encounter for general adult medical examination without abnormal findings: Secondary | ICD-10-CM | POA: Diagnosis not present

## 2015-10-14 DIAGNOSIS — N1330 Unspecified hydronephrosis: Secondary | ICD-10-CM | POA: Diagnosis not present

## 2015-10-14 DIAGNOSIS — N8111 Cystocele, midline: Secondary | ICD-10-CM | POA: Diagnosis not present

## 2015-10-14 DIAGNOSIS — R3912 Poor urinary stream: Secondary | ICD-10-CM | POA: Diagnosis not present

## 2015-10-29 DIAGNOSIS — R1031 Right lower quadrant pain: Secondary | ICD-10-CM | POA: Diagnosis not present

## 2015-10-29 DIAGNOSIS — N8111 Cystocele, midline: Secondary | ICD-10-CM | POA: Diagnosis not present

## 2015-10-29 DIAGNOSIS — R102 Pelvic and perineal pain: Secondary | ICD-10-CM | POA: Diagnosis not present

## 2015-10-30 ENCOUNTER — Telehealth: Payer: Self-pay | Admitting: *Deleted

## 2015-10-30 DIAGNOSIS — IMO0002 Reserved for concepts with insufficient information to code with codable children: Secondary | ICD-10-CM

## 2015-10-30 NOTE — Telephone Encounter (Signed)
Pt aware of the below, order placed.

## 2015-10-30 NOTE — Telephone Encounter (Signed)
-----   Message from [name], MD sent at 10/30/2015  2:01 PM EDT ----- Tell patient I received a copy of the CT scan they did at [facility]. It appears that she has a cyst in the pelvis. Recommend patient schedule a GYN ultrasound here an appointment afterwards with me and we'll discuss this. I do not think it's anything bad but we definitely need to follow up on this.

## 2015-11-02 ENCOUNTER — Ambulatory Visit (INDEPENDENT_AMBULATORY_CARE_PROVIDER_SITE_OTHER): Payer: Medicare Other | Admitting: Gynecology

## 2015-11-02 ENCOUNTER — Encounter: Payer: Self-pay | Admitting: Gynecology

## 2015-11-02 ENCOUNTER — Other Ambulatory Visit: Payer: Self-pay | Admitting: Gynecology

## 2015-11-02 ENCOUNTER — Ambulatory Visit (INDEPENDENT_AMBULATORY_CARE_PROVIDER_SITE_OTHER): Payer: Medicare Other

## 2015-11-02 ENCOUNTER — Ambulatory Visit: Payer: Medicare Other | Admitting: Gynecology

## 2015-11-02 VITALS — BP 118/74

## 2015-11-02 DIAGNOSIS — N83201 Unspecified ovarian cyst, right side: Secondary | ICD-10-CM | POA: Diagnosis not present

## 2015-11-02 DIAGNOSIS — IMO0002 Reserved for concepts with insufficient information to code with codable children: Secondary | ICD-10-CM

## 2015-11-02 DIAGNOSIS — R1900 Intra-abdominal and pelvic swelling, mass and lump, unspecified site: Secondary | ICD-10-CM | POA: Diagnosis not present

## 2015-11-02 DIAGNOSIS — N949 Unspecified condition associated with female genital organs and menstrual cycle: Secondary | ICD-10-CM

## 2015-11-02 NOTE — Patient Instructions (Signed)
Follow up for ultrasound in one month. Office will call you with the blood test results.

## 2015-11-02 NOTE — Progress Notes (Addendum)
    Alison Cuevas June 09, 1946 [id number]        70 y.o.  [id number] presents for ultrasound. Had CT scan done at [facility] urology which describes a cyst in the pelvis. I do not have a copy of that report at this time. She was asked to come for a pelvic ultrasound. She is not having any pelvic pain.  Past medical history,surgical history, problem list, medications, allergies, family history and social history were all reviewed and documented in the EPIC chart.  Directed ROS with pertinent positives and negatives documented in the history of present illness/assessment and plan.  Exam: Caryn Bee assistant Filed Vitals:   11/02/15 1117  BP: 118/74   General appearance:  Normal Abdomen soft nontender without masses guarding rebound Pelvic external BUS vagina with atrophic changes. Bimanual exam with hint of right mass deep in the pelvis difficult to palpate. Rectal exam is normal  Ultrasound status post hysterectomy shows left ovary atrophic with microcalcifications. Right adnexa with cystic echo-free mass 79 mm mean. Thin-walled, avascular with 2 small follicular cysts 11 and 9 mm in the wall. Cul-de-sac without fluid.  Assessment/Plan:  70 y.o. [id number] with new finding of cystic mass in the right pelvis. Difficult to palpate. I reviewed with the patient having had no prior studies difficult to say how long this has been here and it may have been here a long time. She is status post TAH in the past. Also had anterior posterior repair with graft and full suspension. History of right hemicolectomy for diverticulitis with abscess. Reviewed differential to include GYN versus non-GYN such as a inclusion inflammatory cyst. Possible GYN to include benign such as cystadenoma versus possible ovarian cancer. Options to include surgery versus observation reviewed. She appears to be asymptomatic from this and would be at risk given her surgical history with potential of significant adhesions. We'll start with CA  125 and repeat ultrasound in one month. If elevated CA-125 or appears to be enlarging on ultrasound and will refer to GYN oncology. If stable in size and she remains asymptomatic with normal CA-125 then at this point we'll plan observation.  Addendum: Received copy of her CT scan which describes a 79 x 62 mm right pelvic cyst. No adenopathy or ascites.   [name] MD, 11:36 AM 11/02/2015

## 2015-11-03 ENCOUNTER — Telehealth: Payer: Self-pay | Admitting: Gynecology

## 2015-11-03 LAB — CA 125: CA 125: 6 U/mL (ref <35)

## 2015-11-03 NOTE — Telephone Encounter (Signed)
Tell patient CA 125 is normal.  Follow up at her scheduled ultrasound appointment

## 2015-11-03 NOTE — Telephone Encounter (Signed)
Pt informed with the below note. 

## 2015-12-02 ENCOUNTER — Ambulatory Visit: Payer: Medicare Other | Admitting: Family Medicine

## 2015-12-02 ENCOUNTER — Ambulatory Visit (INDEPENDENT_AMBULATORY_CARE_PROVIDER_SITE_OTHER): Payer: Medicare Other

## 2015-12-02 ENCOUNTER — Ambulatory Visit (INDEPENDENT_AMBULATORY_CARE_PROVIDER_SITE_OTHER): Payer: Medicare Other | Admitting: Gynecology

## 2015-12-02 ENCOUNTER — Encounter: Payer: Self-pay | Admitting: Gynecology

## 2015-12-02 VITALS — BP 110/80 | Ht 60.0 in | Wt 140.0 lb

## 2015-12-02 DIAGNOSIS — IMO0002 Reserved for concepts with insufficient information to code with codable children: Secondary | ICD-10-CM

## 2015-12-02 DIAGNOSIS — N949 Unspecified condition associated with female genital organs and menstrual cycle: Secondary | ICD-10-CM | POA: Diagnosis not present

## 2015-12-02 DIAGNOSIS — N83201 Unspecified ovarian cyst, right side: Secondary | ICD-10-CM | POA: Diagnosis not present

## 2015-12-02 NOTE — Progress Notes (Signed)
    Alison Cuevas [birth year] [id number]        70 y.o.  G4P0013 presents for follow up ultrasound.  Had CT scan at [facility] urology for recurrent UTIs which showed a 79 x 62 mm right pelvic cyst. Follow up GYN ultrasound 11/02/2015 showed a 79 mm mean avascular echo-free thin-walled cyst. Cul-de-sac negative fluid. CA-125 6. She presents for a follow up ultrasound.  Past medical history,surgical history, problem list, medications, allergies, family history and social history were all reviewed and documented in the EPIC chart.  Directed ROS with pertinent positives and negatives documented in the history of present illness/assessment and plan.  Exam: Filed Vitals:   12/02/15 1109  BP: 110/80  Height: 5' (1.524 m)  Weight: 140 lb (63.504 kg)   General appearance:  Normal  Ultrasound shows persistence of the right adnexal thin-walled avascular cyst 80 mm mean without significant change from prior study. 2 small follicular appearing avascular simple cysts noted at 10 mm and 9 mm. Left ovary atrophic. Cul-de-sac negative.  Assessment/Plan:  70 y.o. G4P0013 with unchanged right ovarian cystic area. Benign in appearance. CA-125 negative. I again reviewed with the patient I cannot guarantee that this is not cancer but given the benign appearance and her asymptomatic state I recommendation would be to follow expectantly weighing the risks against surgery. Patient agrees with this and we will plan on follow up ultrasound in 3 months.    [name] MD, 11:43 AM 12/02/2015

## 2015-12-02 NOTE — Patient Instructions (Signed)
Follow up ultrasound in 3 months 

## 2015-12-07 ENCOUNTER — Other Ambulatory Visit: Payer: Self-pay | Admitting: *Deleted

## 2015-12-07 MED ORDER — ALPRAZOLAM 0.5 MG PO TABS: 0.5000 mg | ORAL_TABLET | Freq: Three times a day (TID) | ORAL | Status: DC | PRN

## 2015-12-07 NOTE — Telephone Encounter (Signed)
RF request for alprazolam LOV: 08/24/15 Next ov: None Last written: 06/02/15 #90 w/ 5RF  Please advise. Thanks.

## 2015-12-08 NOTE — Telephone Encounter (Signed)
Rx faxed

## 2015-12-09 ENCOUNTER — Ambulatory Visit: Payer: Medicare Other | Admitting: Family Medicine

## 2015-12-11 ENCOUNTER — Ambulatory Visit (INDEPENDENT_AMBULATORY_CARE_PROVIDER_SITE_OTHER): Payer: Medicare Other | Admitting: Family Medicine

## 2015-12-11 ENCOUNTER — Encounter: Payer: Self-pay | Admitting: Family Medicine

## 2015-12-11 VITALS — BP 135/82 | HR 66 | Temp 98.3°F | Resp 20 | Wt 140.5 lb

## 2015-12-11 DIAGNOSIS — H6092 Unspecified otitis externa, left ear: Secondary | ICD-10-CM

## 2015-12-11 MED ORDER — NEOMYCIN-POLYMYXIN-HC 1 % OT SOLN: 3.0000 [drp] | Freq: Four times a day (QID) | OTIC | Status: AC

## 2015-12-11 NOTE — Progress Notes (Signed)
Patient ID: Alison Cuevas, female   DOB: 1945-10-15, 70 y.o.   MRN: [id number]    Alison Cuevas , 12/02/1945, 70 y.o., female MRN: [id number]  CC: ear pain  Subjective: Pt presents for an acute OV with complaints of left ear pain of 2 weeks duration. Associated symptoms include pain and itching. She denies fever, chills, tinnitus or hearing changes. She was using sweet oil and fungal drops for her ears, but her left is bothering her som much she just can not stand it anymore.  Allergies  Allergen Reactions  . Prednisone     REACTION: funny feeling   Social History  Substance Use Topics  . Smoking status: Never Smoker   . Smokeless tobacco: Never Used  . Alcohol Use: No   Past Medical History  Diagnosis Date  . Allergic rhinitis     maple pollen  . Hypercholesteremia     mild; pt declined statin trial 05/2014--needs recheck lipid panel at first f/u visit in 2017  . GERD (gastroesophageal reflux disease)   . Diverticulosis of colon   . DJD (degenerative joint disease)   . Anxiety   . Hypertension   . Osteopenia 07/2015    T score of -1.4 FRAX15%/2%  . Chronic low back pain     Dr. Trenton Gammon.  Has had back injection--bp elevated after.  . Phlebitis   . Varicose vein     left leg   . Herpes zoster 07/08/2014  . Trochanteric bursitis of right hip     Recurrent (injection by Dr. [name] 05/26/15)   Past Surgical History  Procedure Laterality Date  . Right hemicolectomy for diverticulitis with abscess  1993  . Abdominal hysterectomy      TAH  . Resection of colon      BENIGN TUMOR  . Tubal ligation    . Cholecystectomy, laparoscopic    . Cspine surgery      Dr. Wiliam Ke level ant cerv discectomy /fusion w/plating  . Cysto N/A 03/18/2014    Procedure: CYSTO;  Surgeon: [name], MD;  Location: [facility];  Service: Urology;  Laterality: N/A;  . Anterior and posterior repair N/A 03/18/2014    Procedure: Cystocele repair with graft, Vault suspension, Rectocele repair;  Surgeon:  [name], MD;  Location: [facility];  Service: Urology;  Laterality: N/A;  . Colonoscopy  04/2006    (Dr. [name]): Normal: recall 04/2016 (Dr. [name])   Family History  Problem Relation Age of Onset  . Hypertension Mother   . Breast cancer Sister 66  . Cancer Brother     melanoma  . Cancer Brother     prostate,leukemia  . Lupus Sister   . Other Brother     lung disease  . Other      Family member with MGUS     Medication List       This list is accurate as of: 12/11/15  3:35 PM.  Always use your most recent med list.               ALPRAZolam 0.5 MG tablet  Commonly known as:  XANAX  Take 1 tablet (0.5 mg total) by mouth 3 (three) times daily as needed for anxiety.     aspirin 81 MG tablet  Take 81 mg by mouth daily.     docusate sodium 100 MG capsule  Commonly known as:  COLACE  Take 1 capsule (100 mg total) by mouth 2 (two) times daily.     estradiol 0.1  MG/GM vaginal cream  Commonly known as:  ESTRACE VAGINAL  Apply 1/4 applicator 3 times weekly vaginally.     fexofenadine 60 MG tablet  Commonly known as:  ALLEGRA  Take 1 tablet (60 mg total) by mouth 2 (two) times daily.     fluticasone 50 MCG/ACT nasal spray  Commonly known as:  FLONASE  Place 2 sprays into both nostrils at bedtime.     HYDROcodone-acetaminophen 5-325 MG tablet  Commonly known as:  NORCO/VICODIN  1 tab po tid prn pain     lisinopril 10 MG tablet  Commonly known as:  PRINIVIL,ZESTRIL  Take 0.5 tablets (5 mg total) by mouth at bedtime. If b/p greater than 140/90 patient takes 10 mg po     meloxicam 15 MG tablet  Commonly known as:  MOBIC  1 tab po qd with food as needed for musculoskeletal pain     verapamil 180 MG CR tablet  Commonly known as:  CALAN-SR  Take 1 tablet (180 mg total) by mouth at bedtime.         ROS: Negative, with the exception of above mentioned in HPI   Objective:  BP 135/82 mmHg  Pulse 66  Temp(Src) 98.3 F (36.8 C) (Oral)  Resp 20  Wt 140  lb 8 oz (63.73 kg)  SpO2 96%  LMP 06/27/1972 Body mass index is 27.44 kg/(m^2). Gen: Afebrile. No acute distress. Nontoxic in appearance. Well developed , well nourished female.  HENT: AT. [place]. Bilateral TM visualized and normal in appearance. EAM right WNL, Left EAM with mild cerumen buildup, and erythema.  MMM, no oral lesions.  Eyes:Pupils Equal Round Reactive to light, Extraocular movements intact,  Conjunctiva without redness, discharge or icterus. Neck/lymp/endocrine: Supple, No lymphadenopathy  Assessment/Plan: Alison Cuevas is a 70 y.o. female present for acute OV for  Otitis externa of left ear - No more sweet oil in the ear for now.  - Continue flonase daily.  - mild erythema/irritation of canal. Mild cerumen buildup, pt offered lavage and declined. She said she had that before and it made it worse.  - NEOMYCIN-POLYMYXIN-HYDROCORTISONE (CORTISPORIN) 1 % SOLN otic solution; Place 3 drops into both ears 4 (four) times daily.  Dispense: 10 mL; Refill: 0 - F/U PRN  electronically signed by:  Howard Pouch, DO  [place]

## 2015-12-11 NOTE — Patient Instructions (Signed)
I have called in an ear drop for you to use in your ears for 10 days.  No more sweet oil. :)  Otitis Externa Otitis externa is a bacterial or fungal infection of the outer ear canal. This is the area from the eardrum to the outside of the ear. Otitis externa is sometimes called "swimmer's ear." CAUSES  Possible causes of infection include:  Swimming in dirty water.  Moisture remaining in the ear after swimming or bathing.  Mild injury (trauma) to the ear.  Objects stuck in the ear (foreign body).  Cuts or scrapes (abrasions) on the outside of the ear. SIGNS AND SYMPTOMS  The first symptom of infection is often itching in the ear canal. Later signs and symptoms may include swelling and redness of the ear canal, ear pain, and yellowish-white fluid (pus) coming from the ear. The ear pain may be worse when pulling on the earlobe. DIAGNOSIS  Your health care provider will perform a physical exam. A sample of fluid may be taken from the ear and examined for bacteria or fungi. TREATMENT  Antibiotic ear drops are often given for 10 to 14 days. Treatment may also include pain medicine or corticosteroids to reduce itching and swelling. HOME CARE INSTRUCTIONS   Apply antibiotic ear drops to the ear canal as prescribed by your health care provider.  Take medicines only as directed by your health care provider.  If you have diabetes, follow any additional treatment instructions from your health care provider.  Keep all follow-up visits as directed by your health care provider. PREVENTION   Keep your ear dry. Use the corner of a towel to absorb water out of the ear canal after swimming or bathing.  Avoid scratching or putting objects inside your ear. This can damage the ear canal or remove the protective wax that lines the canal. This makes it easier for bacteria and fungi to grow.  Avoid swimming in lakes, polluted water, or poorly chlorinated pools.  You may use ear drops made of rubbing  alcohol and vinegar after swimming. Combine equal parts of white vinegar and alcohol in a bottle. Put 3 or 4 drops into each ear after swimming. SEEK MEDICAL CARE IF:   You have a fever.  Your ear is still red, swollen, painful, or draining pus after 3 days.  Your redness, swelling, or pain gets worse.  You have a severe headache.  You have redness, swelling, pain, or tenderness in the area behind your ear. MAKE SURE YOU:   Understand these instructions.  Will watch your condition.  Will get help right away if you are not doing well or get worse.   This information is not intended to replace advice given to you by your health care provider. Make sure you discuss any questions you have with your health care provider.   Document Released: 06/13/2005 Document Revised: 07/04/2014 Document Reviewed: 06/30/2011 Elsevier Interactive Patient Education [employer].

## 2015-12-15 ENCOUNTER — Ambulatory Visit: Payer: Medicare Other | Admitting: Family Medicine

## 2015-12-24 ENCOUNTER — Ambulatory Visit (INDEPENDENT_AMBULATORY_CARE_PROVIDER_SITE_OTHER): Payer: Medicare Other | Admitting: Family Medicine

## 2015-12-24 ENCOUNTER — Encounter: Payer: Self-pay | Admitting: Family Medicine

## 2015-12-24 VITALS — BP 136/73 | HR 59 | Temp 98.0°F | Resp 16 | Ht 60.0 in | Wt 139.0 lb

## 2015-12-24 DIAGNOSIS — R197 Diarrhea, unspecified: Secondary | ICD-10-CM

## 2015-12-24 DIAGNOSIS — E7800 Pure hypercholesterolemia, unspecified: Secondary | ICD-10-CM | POA: Diagnosis not present

## 2015-12-24 DIAGNOSIS — R143 Flatulence: Secondary | ICD-10-CM | POA: Diagnosis not present

## 2015-12-24 DIAGNOSIS — Z9889 Other specified postprocedural states: Secondary | ICD-10-CM

## 2015-12-24 DIAGNOSIS — H938X2 Other specified disorders of left ear: Secondary | ICD-10-CM

## 2015-12-24 LAB — LIPID PANEL
Cholesterol: 162 mg/dL (ref 125–200)
HDL: 61 mg/dL (ref >=46)
LDL Cholesterol: 89 mg/dL (ref <130)
Total CHOL/HDL Ratio: 2.7 Ratio (ref <=5.0)
Triglycerides: 60 mg/dL (ref <150)
VLDL: 12 mg/dL (ref <30)

## 2015-12-24 MED ORDER — NEOMYCIN-POLYMYXIN-HC 1 % OT SOLN: 3.0000 [drp] | Freq: Four times a day (QID) | OTIC | Status: DC

## 2015-12-24 NOTE — Progress Notes (Signed)
OFFICE VISIT  12/24/2015   CC:  Chief Complaint  Patient presents with  . Diarrhea    off and on since 2007  . Ear Problem    right  . Gas    HPI:    Patient is a 70 y.o. Caucasian female who presents for ongoing left ear pain.  Improved compared to when she was here 2 wks ago.  Has taken cortisporin otic drops for 10d.  Right ear had been feeling normal but it began to hurt so she used the cortisporin in this ear, too.  It feels ok now.  No ear drainage.  Complains of recurrent episodes of diarrhea that is difficult to hold in, the urge comes on her very fast at times.  Says there is absolutely no trigger, seems random.  Has lots of gas that she has trouble controlling as well.  No abd cramping with all this.  She does have many periods in which her stools are completely normal, though.  This has all been happening for approx 10 yrs, seemed to stop for a few years, then started back up again about 6-12 mo ago.  She doesn't relate it to medication intake.  Past Medical History  Diagnosis Date  . Allergic rhinitis     maple pollen  . Hypercholesteremia     mild; pt declined statin trial 05/2014--needs recheck lipid panel at first f/u visit in 2017  . GERD (gastroesophageal reflux disease)   . Diverticulosis of colon   . DJD (degenerative joint disease)   . Anxiety   . Hypertension   . Osteopenia 07/2015    T score of -1.4 FRAX15%/2%  . Chronic low back pain     Dr. Trenton Gammon.  Has had back injection--bp elevated after.  . Phlebitis   . Varicose vein     left leg   . Herpes zoster 07/08/2014  . Trochanteric bursitis of right hip     Recurrent (injection by Dr. [name] 05/26/15)    Past Surgical History  Procedure Laterality Date  . Right hemicolectomy for diverticulitis with abscess  1993  . Abdominal hysterectomy      TAH  . Resection of colon      BENIGN TUMOR  . Tubal ligation    . Cholecystectomy, laparoscopic    . Cspine surgery      Dr. Wiliam Ke level ant cerv  discectomy /fusion w/plating  . Cysto N/A 03/18/2014    Procedure: CYSTO;  Surgeon: [name], MD;  Location: [facility];  Service: Urology;  Laterality: N/A;  . Anterior and posterior repair N/A 03/18/2014    Procedure: Cystocele repair with graft, Vault suspension, Rectocele repair;  Surgeon: [name], MD;  Location: [facility];  Service: Urology;  Laterality: N/A;  . Colonoscopy  04/2006    (Dr. [name]): Normal: recall 04/2016 (Dr. [name])    Outpatient Prescriptions Prior to Visit  Medication Sig Dispense Refill  . ALPRAZolam (XANAX) 0.5 MG tablet Take 1 tablet (0.5 mg total) by mouth 3 (three) times daily as needed for anxiety. 90 tablet 5  . aspirin 81 MG tablet Take 81 mg by mouth daily.    Marland Kitchen docusate sodium (COLACE) 100 MG capsule Take 1 capsule (100 mg total) by mouth 2 (two) times daily. 20 capsule 0  . estradiol (ESTRACE VAGINAL) 0.1 MG/GM vaginal cream Apply 1/4 applicator 3 times weekly vaginally. 42.5 g 2  . fexofenadine (ALLEGRA) 60 MG tablet Take 1 tablet (60 mg total) by mouth 2 (two)  times daily. 60 tablet 6  . fluticasone (FLONASE) 50 MCG/ACT nasal spray Place 2 sprays into both nostrils at bedtime. 16 g 3  . HYDROcodone-acetaminophen (NORCO/VICODIN) 5-325 MG tablet 1 tab po tid prn pain 90 tablet 0  . lisinopril (PRINIVIL,ZESTRIL) 10 MG tablet Take 0.5 tablets (5 mg total) by mouth at bedtime. If b/p greater than 140/90 patient takes 10 mg po 90 tablet 3  . meloxicam (MOBIC) 15 MG tablet 1 tab po qd with food as needed for musculoskeletal pain 90 tablet 1  . verapamil (CALAN-SR) 180 MG CR tablet Take 1 tablet (180 mg total) by mouth at bedtime. 90 tablet 3   No facility-administered medications prior to visit.    Allergies  Allergen Reactions  . Prednisone     REACTION: funny feeling    ROS As per HPI  PE: Blood pressure 136/73, pulse 59, temperature 98 F (36.7 C), temperature source Oral, resp. rate 16, height 5' (1.524 m), weight 139 lb (63.05  kg), last menstrual period 06/27/1972, SpO2 94 %. Gen: Alert, well appearing.  Patient is oriented to person, place, time, and situation. ENT: right ear with mild diffuse erythema of canal epithelium.  No swelling or exudate.  TM appears normal. ABD: soft, NT/ND BS normal.  No HSM, bruit, or mass.  LABS:  None today  IMPRESSION AND PLAN:  1) Persistent left ear pain: irritated canal but pretty mild- appearing.  Will do 10 more days of cortisporin otic at 3 drops qid. She says she has paperwork done to go to Dr. Melene Plan, and ENT, if the ear continues to be a problem for her.  2) Chronic, recurrent episodes of urgent diarrhea. Likely somewhat related to her hx of hemicolectomy, but I can't answer why this is so randomly episodic nor can I answer why she is so gassy.  She wanted to know if she should get her colonoscopy moved up (planned for later this year as it stands now).  I said that actually seeing the GI MD would be better so I made this referral today.  3) Hyperlipidemia: she is due for FLP today.  An After Visit Summary was printed and given to the patient.  Spent 30 min with pt today, with >50% of this time spent in counseling and care coordination regarding the above problems.  FOLLOW UP: Return if symptoms worsen or fail to improve.  Signed:  Crissie Sickles, MD           12/24/2015

## 2015-12-24 NOTE — Progress Notes (Signed)
Pre visit review using our clinic review tool, if applicable. No additional management support is needed unless otherwise documented below in the visit note. 

## 2015-12-25 ENCOUNTER — Encounter: Payer: Self-pay | Admitting: Family Medicine

## 2015-12-25 ENCOUNTER — Encounter: Payer: Self-pay | Admitting: Internal Medicine

## 2016-01-12 ENCOUNTER — Other Ambulatory Visit: Payer: Self-pay | Admitting: *Deleted

## 2016-01-12 MED ORDER — FLUTICASONE PROPIONATE 50 MCG/ACT NA SUSP: 2.0000 | Freq: Every day | NASAL | Status: DC

## 2016-01-12 NOTE — Telephone Encounter (Signed)
RF request for fluticasone LOV: 08/24/15 Next ov: None Last written: 03/19/15 #16g w/ 3RF

## 2016-01-22 ENCOUNTER — Ambulatory Visit: Payer: Medicare Other | Admitting: Internal Medicine

## 2016-01-25 ENCOUNTER — Other Ambulatory Visit: Payer: Self-pay | Admitting: *Deleted

## 2016-01-25 MED ORDER — HYDROCODONE-ACETAMINOPHEN 5-325 MG PO TABS: ORAL_TABLET | ORAL | 0 refills | Status: DC

## 2016-01-25 NOTE — Telephone Encounter (Signed)
RF request for hydro/apap LOV: 12/24/15 Next ov: None Last written: 06/03/15 #90 w/ 0RF  Please advise. Thanks.   Pt stated that son Torilynn Shippee may p/u Rx sometime this week.

## 2016-01-26 NOTE — Telephone Encounter (Signed)
Left message on home vm stating that Rx (name of medication was not left) was ready for p/u at our front desk.

## 2016-02-03 ENCOUNTER — Telehealth: Payer: Self-pay | Admitting: Family Medicine

## 2016-02-03 NOTE — Telephone Encounter (Signed)
Please advise. Thanks.  

## 2016-02-03 NOTE — Telephone Encounter (Signed)
VOICEMAIL MESSAGE LEFT ON PHONE AT [place] ([place] 02/02/16 @ 4:11pm)  Patient calling to report she has was seen 1 month ago for ear ache.  She states she is not better and also has pain and pressure behind her eyes and thinks she has a sinus infection.  Pt requesting rx for Z-pak.

## 2016-02-03 NOTE — Telephone Encounter (Signed)
Sorry,pt needs o/v so I can determine best course of treatment.

## 2016-02-03 NOTE — Telephone Encounter (Signed)
Pt advised and voiced understanding.  She stated that she did not want to make an apt that this time due to husbands doctor apts. She stated that she will look into finding an ENT doctor.

## 2016-02-04 ENCOUNTER — Encounter: Payer: Self-pay | Admitting: Family Medicine

## 2016-02-04 ENCOUNTER — Ambulatory Visit (INDEPENDENT_AMBULATORY_CARE_PROVIDER_SITE_OTHER): Payer: Medicare Other | Admitting: Family Medicine

## 2016-02-04 VITALS — BP 127/82 | HR 67 | Temp 98.1°F | Resp 16 | Ht 60.0 in | Wt 141.2 lb

## 2016-02-04 DIAGNOSIS — J0101 Acute recurrent maxillary sinusitis: Secondary | ICD-10-CM

## 2016-02-04 MED ORDER — AZITHROMYCIN 250 MG PO TABS: ORAL_TABLET | ORAL | 0 refills | Status: DC

## 2016-02-04 NOTE — Progress Notes (Signed)
OFFICE VISIT  02/04/2016   CC:  Chief Complaint  Patient presents with  . URI   HPI:    Patient is a 70 y.o. Caucasian female who presents for "sinus congestion".  Says symptoms have been present 2-3 weeks and just keep getting worse. Has L>R ear pain, HA, nose burning, blowing out green mucous from nose, "eyes hurt".  Describes malaise.  Says face hurts. No fever.  Some cough starting 3-5 days ago, dry.  No fevers or SOB. Trying neti pot, allegra, mucinex, flonase, and saline nasal spray.  Past Medical History:  Diagnosis Date  . Allergic rhinitis    maple pollen  . Anxiety   . Chronic low back pain    Dr. Trenton Gammon.  Has had back injection--bp elevated after.  . Diverticulosis of colon   . DJD (degenerative joint disease)   . GERD (gastroesophageal reflux disease)   . Herpes zoster 07/08/2014  . Hypercholesteremia    mild; pt declined statin trial 05/2014--needs recheck lipid panel at first f/u visit in 2017  . Hypertension   . Osteopenia 07/2015   T score of -1.4 FRAX15%/2%  . Ovarian cyst 2017   GYN following: pt states she gets f/u imaging 02/2016  . Phlebitis   . Trochanteric bursitis of right hip    Recurrent (injection by Dr. [name] 05/26/15)  . Varicose vein    left leg     Past Surgical History:  Procedure Laterality Date  . ABDOMINAL HYSTERECTOMY     TAH  . ANTERIOR AND POSTERIOR REPAIR N/A 03/18/2014   Procedure: Cystocele repair with graft, Vault suspension, Rectocele repair;  Surgeon: [name], MD;  Location: [facility];  Service: Urology;  Laterality: N/A;  . CHOLECYSTECTOMY, LAPAROSCOPIC    . COLONOSCOPY  04/2006   (Dr. [name]): Normal: recall 04/2016 (Dr. [name])  . cspine surgery     Dr. Wiliam Ke level ant cerv discectomy /fusion w/plating  . CYSTO N/A 03/18/2014   Procedure: CYSTO;  Surgeon: [name], MD;  Location: [facility];  Service: Urology;  Laterality: N/A;  . RESECTION OF COLON     BENIGN TUMOR  . right hemicolectomy for  diverticulitis with abscess  1993  . TUBAL LIGATION      Outpatient Medications Prior to Visit  Medication Sig Dispense Refill  . ALPRAZolam (XANAX) 0.5 MG tablet Take 1 tablet (0.5 mg total) by mouth 3 (three) times daily as needed for anxiety. 90 tablet 5  . aspirin 81 MG tablet Take 81 mg by mouth daily.    Marland Kitchen docusate sodium (COLACE) 100 MG capsule Take 1 capsule (100 mg total) by mouth 2 (two) times daily. 20 capsule 0  . estradiol (ESTRACE VAGINAL) 0.1 MG/GM vaginal cream Apply 1/4 applicator 3 times weekly vaginally. 42.5 g 2  . fexofenadine (ALLEGRA) 60 MG tablet Take 1 tablet (60 mg total) by mouth 2 (two) times daily. 60 tablet 6  . fluticasone (FLONASE) 50 MCG/ACT nasal spray Place 2 sprays into both nostrils at bedtime. 16 g 3  . HYDROcodone-acetaminophen (NORCO/VICODIN) 5-325 MG tablet 1 tab po tid prn pain 90 tablet 0  . lisinopril (PRINIVIL,ZESTRIL) 10 MG tablet Take 0.5 tablets (5 mg total) by mouth at bedtime. If b/p greater than 140/90 patient takes 10 mg po 90 tablet 3  . meloxicam (MOBIC) 15 MG tablet 1 tab po qd with food as needed for musculoskeletal pain 90 tablet 1  . NEOMYCIN-POLYMYXIN-HYDROCORTISONE (CORTISPORIN) 1 % SOLN otic solution Place 3 drops into the  left ear 4 (four) times daily. 10 mL 0  . verapamil (CALAN-SR) 180 MG CR tablet Take 1 tablet (180 mg total) by mouth at bedtime. 90 tablet 3   No facility-administered medications prior to visit.     Allergies  Allergen Reactions  . Prednisone     REACTION: funny feeling    ROS As per HPI  PE: Blood pressure 127/82, pulse 67, temperature 98.1 F (36.7 C), temperature source Oral, resp. rate 16, height 5' (1.524 m), weight 141 lb 4 oz (64.1 kg), last menstrual period 06/27/1972, SpO2 97 %. VS: noted--normal. Gen: alert, NAD, NONTOXIC APPEARING. HEENT: eyes without injection, drainage, or swelling.  Ears: EACs clear, TMs with normal light reflex and landmarks.  Nose: Clear rhinorrhea, with some dried,  crusty exudate adherent to mildly injected mucosa.  No purulent d/c.  Diffuse paranasal sinus TTP.  No facial swelling.  Throat and mouth without focal lesion.  No pharyngial swelling, erythema, or exudate.   Neck: supple, no LAD.   LUNGS: CTA bilat, nonlabored resps.   CV: RRR, no m/r/g. EXT: no c/c/e SKIN: no rash   LABS:  None today    Chemistry      Component Value Date/Time   NA 135 06/03/2015 1034   K 4.7 06/03/2015 1034   CL 99 06/03/2015 1034   CO2 26 06/03/2015 1034   BUN 21 06/03/2015 1034   CREATININE 0.87 06/03/2015 1034      Component Value Date/Time   CALCIUM 10.1 06/03/2015 1034   ALKPHOS 66 06/16/2014 1149   AST 24 06/16/2014 1149   ALT 22 06/16/2014 1149   BILITOT 0.9 06/16/2014 1149       IMPRESSION AND PLAN:  Recurrent acute maxillary sinusitis:  Continue current measures, add azithromycin x 5d. If not signif improved in 5d, we'll obtain sinus x-rays.  An After Visit Summary was printed and given to the patient.   FOLLOW UP: Return if symptoms worsen or fail to improve.  Signed:  Crissie Sickles, MD           02/04/2016

## 2016-02-04 NOTE — Progress Notes (Signed)
Pre visit review using our clinic review tool, if applicable. No additional management support is needed unless otherwise documented below in the visit note. 

## 2016-02-09 ENCOUNTER — Other Ambulatory Visit: Payer: Self-pay | Admitting: Family Medicine

## 2016-02-09 ENCOUNTER — Telehealth: Payer: Self-pay | Admitting: *Deleted

## 2016-02-09 ENCOUNTER — Telehealth: Payer: Self-pay | Admitting: Family Medicine

## 2016-02-09 ENCOUNTER — Ambulatory Visit (HOSPITAL_COMMUNITY)
Admission: RE | Admit: 2016-02-09 | Discharge: 2016-02-09 | Disposition: A | Discharge status: Home / Self Care | Payer: Medicare Other | Source: Ambulatory Visit | Attending: Family Medicine | Admitting: Family Medicine

## 2016-02-09 DIAGNOSIS — J329 Chronic sinusitis, unspecified: Secondary | ICD-10-CM

## 2016-02-09 DIAGNOSIS — J019 Acute sinusitis, unspecified: Secondary | ICD-10-CM | POA: Diagnosis not present

## 2016-02-09 MED ORDER — LEVOFLOXACIN 500 MG PO TABS: 500.0000 mg | ORAL_TABLET | Freq: Every day | ORAL | 0 refills | Status: DC

## 2016-02-09 NOTE — Telephone Encounter (Signed)
Pt advised. She stated that she cannot drive all the way out to [place] for xray, she stated that she just wanted the antibiotic. She stated that she has an apt with an ENT in [place] on 02/25/16.

## 2016-02-09 NOTE — Telephone Encounter (Signed)
OK fine 

## 2016-02-09 NOTE — Telephone Encounter (Signed)
Pt advised and voiced understanding.   

## 2016-02-09 NOTE — Telephone Encounter (Signed)
Patient requesting x ray order to be for [facility].  Please advise.

## 2016-02-09 NOTE — Telephone Encounter (Signed)
Levaquin eRx'd, but I also want her to get a sinus x-ray: I have ordered this for med [facility] [place].-thx

## 2016-02-09 NOTE — Telephone Encounter (Signed)
Pt called and stated that she finished her antibiotic yesterday and is not feeling any better. She stated that she is still congested and is having green bloody discharge from her nose. She stated that her right ear is starting to hurt and she is running a low grade fever (99.4F). She stated that she has family coming to visit Thursday and wants to be well when they get here. She is requesting another antibiotic. Please advise. Thanks. Pharm: [facility]

## 2016-02-09 NOTE — Telephone Encounter (Signed)
OK, order is placed as per pt request.

## 2016-02-10 ENCOUNTER — Telehealth: Payer: Self-pay | Admitting: *Deleted

## 2016-02-10 ENCOUNTER — Other Ambulatory Visit: Payer: Self-pay | Admitting: Family Medicine

## 2016-02-10 DIAGNOSIS — J329 Chronic sinusitis, unspecified: Secondary | ICD-10-CM

## 2016-02-10 DIAGNOSIS — R930 Abnormal findings on diagnostic imaging of skull and head, not elsewhere classified: Secondary | ICD-10-CM

## 2016-02-10 DIAGNOSIS — R51 Headache: Secondary | ICD-10-CM

## 2016-02-10 DIAGNOSIS — R519 Headache, unspecified: Secondary | ICD-10-CM

## 2016-02-10 NOTE — Telephone Encounter (Signed)
Pt Alison Cuevas on 02/10/16 at 2:31pm requesting the results to her sinus xray. She also wanted to know if Dr. Anitra Lauth thinks she may be contagious. Please advise. Thanks.

## 2016-02-11 ENCOUNTER — Ambulatory Visit (HOSPITAL_BASED_OUTPATIENT_CLINIC_OR_DEPARTMENT_OTHER)
Admission: RE | Admit: 2016-02-11 | Discharge: 2016-02-11 | Disposition: A | Discharge status: Home / Self Care | Payer: Medicare Other | Source: Ambulatory Visit | Attending: Family Medicine | Admitting: Family Medicine

## 2016-02-11 ENCOUNTER — Other Ambulatory Visit: Payer: Self-pay | Admitting: Family Medicine

## 2016-02-11 DIAGNOSIS — J0101 Acute recurrent maxillary sinusitis: Secondary | ICD-10-CM

## 2016-02-11 DIAGNOSIS — R930 Abnormal findings on diagnostic imaging of skull and head, not elsewhere classified: Secondary | ICD-10-CM

## 2016-02-11 DIAGNOSIS — J3489 Other specified disorders of nose and nasal sinuses: Secondary | ICD-10-CM | POA: Diagnosis not present

## 2016-02-11 NOTE — Telephone Encounter (Signed)
Please see xray result. Pt was advised of result.

## 2016-02-19 ENCOUNTER — Telehealth: Payer: Self-pay | Admitting: Family Medicine

## 2016-02-19 DIAGNOSIS — J329 Chronic sinusitis, unspecified: Secondary | ICD-10-CM

## 2016-02-19 MED ORDER — FLUCONAZOLE 150 MG PO TABS: 150.0000 mg | ORAL_TABLET | Freq: Once | ORAL | 0 refills | Status: AC

## 2016-02-19 NOTE — Telephone Encounter (Signed)
Pt advised and voiced understanding.   

## 2016-02-19 NOTE — Telephone Encounter (Signed)
Patient requesting diflucan rx since she has been on antibiotics.  Please advise.

## 2016-02-19 NOTE — Telephone Encounter (Signed)
Fluconazole eRx'd. Ordered referral to Dr. Benjamine Mola.

## 2016-02-19 NOTE — Telephone Encounter (Signed)
Please advise. Thanks.  

## 2016-02-19 NOTE — Telephone Encounter (Signed)
Left message for pt to call back  °

## 2016-02-19 NOTE — Telephone Encounter (Signed)
(  note: this is an addition to previous message sent earlier today, see below).    Patient called back to also request referral to ENT for ear and sinus pain.  Alison Cuevas has an appt to see Dr. Tamsen Roers Teoh in [place] on 02/25/16.  However, they need referral and office notes from pcp faxed to their office prior to her schedule appt.

## 2016-02-25 ENCOUNTER — Ambulatory Visit (INDEPENDENT_AMBULATORY_CARE_PROVIDER_SITE_OTHER): Payer: Medicare Other | Admitting: Otolaryngology

## 2016-02-25 DIAGNOSIS — J0101 Acute recurrent maxillary sinusitis: Secondary | ICD-10-CM

## 2016-02-25 DIAGNOSIS — J343 Hypertrophy of nasal turbinates: Secondary | ICD-10-CM | POA: Diagnosis not present

## 2016-03-02 ENCOUNTER — Encounter: Payer: Self-pay | Admitting: Family Medicine

## 2016-03-02 ENCOUNTER — Telehealth: Payer: Self-pay | Admitting: *Deleted

## 2016-03-02 ENCOUNTER — Ambulatory Visit (INDEPENDENT_AMBULATORY_CARE_PROVIDER_SITE_OTHER): Payer: Medicare Other | Admitting: Gynecology

## 2016-03-02 ENCOUNTER — Ambulatory Visit (INDEPENDENT_AMBULATORY_CARE_PROVIDER_SITE_OTHER): Payer: Medicare Other

## 2016-03-02 VITALS — BP 120/70

## 2016-03-02 DIAGNOSIS — N952 Postmenopausal atrophic vaginitis: Secondary | ICD-10-CM

## 2016-03-02 DIAGNOSIS — N83201 Unspecified ovarian cyst, right side: Secondary | ICD-10-CM | POA: Diagnosis not present

## 2016-03-02 DIAGNOSIS — IMO0002 Reserved for concepts with insufficient information to code with codable children: Secondary | ICD-10-CM

## 2016-03-02 DIAGNOSIS — H9202 Otalgia, left ear: Secondary | ICD-10-CM

## 2016-03-02 DIAGNOSIS — N949 Unspecified condition associated with female genital organs and menstrual cycle: Secondary | ICD-10-CM

## 2016-03-02 DIAGNOSIS — R102 Pelvic and perineal pain: Secondary | ICD-10-CM

## 2016-03-02 LAB — WET PREP FOR TRICH, YEAST, CLUE
Clue Cells Wet Prep HPF POC: NONE SEEN
Trich, Wet Prep: NONE SEEN
WBC, Wet Prep HPF POC: NONE SEEN
Yeast Wet Prep HPF POC: NONE SEEN

## 2016-03-02 MED ORDER — TERCONAZOLE 0.4 % VA CREA: 1.0000 | TOPICAL_CREAM | Freq: Every day | VAGINAL | 0 refills | Status: DC

## 2016-03-02 NOTE — Patient Instructions (Signed)
Use the vaginal cream nightly for 7 nights. Follow up if the vaginal pain continues.  You'll hear about the appointment with the GYN cancer doctors over the next 1-2 weeks. If you do not hear to make that appointment then call my office.

## 2016-03-02 NOTE — Telephone Encounter (Signed)
Left message on cancer center voicemail to schedule patient and to call me once scheduled to relay.

## 2016-03-02 NOTE — Telephone Encounter (Signed)
-----   Message from [name], MD sent at 03/02/2016 11:47 AM EDT ----- Patient needs consult appointment with GYN oncologist reference persistent right adnexal cystic mass in 69 year old female

## 2016-03-02 NOTE — Addendum Note (Signed)
Addended by: Nelva Nay on: 03/02/2016 12:59 PM   Modules accepted: Orders

## 2016-03-02 NOTE — Progress Notes (Signed)
Alison Cuevas 07-16-1945 [id number]        70 y.o.  [id number] presents with several issues:  1. Follow up ultrasound in reference to her pelvic cyst.  Had CT scan done 10/2015 10/2015 at [facility] where a pelvic cyst was described as a 7.9 x 6.2 cm nonenhancing cystic structure the right hemipelvis. No free fluid or significant adenopathy noted.  Follow up ultrasound showed a right adnexal cystic mass 79 mm mean. Thin-walled, avascular with 2 adjacent small cysts. CA-125 was 6.  Repeat ultrasound the following month showed the same cyst without significant change. Options for intervention versus observation were discussed. Patient was asymptomatic from a pain standpoint and after discussing cannot rule out malignancy she was comfortable with observation with follow up ultrasound in 3 months. She does have a history of right hemicolectomy for diverticular abscess. 2. Left earache over the past week. Being treated for sinusitis with several courses of antibiotics and now finishing the third course. No fever or chills. No sore throat or neck stiffness. Is feeling better from the sinusitis standpoint. 3. Vaginal pain with irritation. Wonders whether she has a yeast infection due to all the antibiotics. No significant discharge or itching or odor. No urinary symptoms such as frequency dysuria or urgency. Noticed over the last several weeks.  Past medical history,surgical history, problem list, medications, allergies, family history and social history were all reviewed and documented in the EPIC chart.  Directed ROS with pertinent positives and negatives documented in the history of present illness/assessment and plan.  Exam: Caryn Bee assistant Vitals:   03/02/16 1053  BP: 120/70   General appearance:  Normal HEENT shows neck supple without adenopathy or evidence of pharyngitis. Bilateral otoscopic exam normal appearing tympanic membranes without evidence of erythema or fluid. Abdomen soft  nontender without masses guarding rebound Pelvic external BUS vagina with atrophic changes. Bimanual exam with a hint of the right mass deep in the pelvis but difficult to palpate. Rectal exam is normal.  Ultrasound shows right adnexal cystic mass 85 mm mean with echogenic floating debris. Negative flow. Small cyst 13 x 12 mm on the wall of the cystic mass. Left ovary is normal. Cul-de-sac negative for fluid.  Assessment/Plan:  70 y.o. [id number] with:  1. Persistent asymptomatic right pelvic cystic mass. Roughly unchanged although slightly larger by 5 mm thin last measurement. Normal CA-125. I again reviewed differential to include malignancy versus benign with the patient. Possible benign ovarian cyst or inclusion cyst from her abscess/hemicolectomy. At this point I reviewed options with her and I'm going to refer her to a gynecologic oncologist for their assessment and recommendations. I think if surgery would be recommended they would be the most appropriate to perform given potential for malignancy and pelvic distortion from her prior surgeries. Patient will follow up with them and then we will go from there. She knows that she does not hear within 2 weeks to make an appointment to call my office. 2. Left earache. Exam is normal without evidence of otitis. I suspect she may have some mild fluid from her sinusitis/antibiotic treatment. She does have a follow up appointment with her primary physician and if it continues she'll follow up with him in reference to this. 3. Vaginal discomfort. She does have atrophic changes. Wet prep was negative for yeast and she did not have any significant discharge. Discussed differential to include possible atrophic changes causing the discomfort or possible low-grade yeast not evident on wet prep. Given her history  and antibiotic treatment will go ahead and cover her with Terazol 7 day vaginal cream nightly 7 days. Will follow up if her symptoms  persist.    [name] MD, 11:34 AM 03/02/2016

## 2016-03-03 NOTE — Telephone Encounter (Signed)
Appointment on 03/16/16 @ 9:30am with Dr.Rossi,  left message for pt to call.

## 2016-03-03 NOTE — Telephone Encounter (Signed)
Pt informed the below note 

## 2016-03-07 ENCOUNTER — Other Ambulatory Visit: Payer: Self-pay

## 2016-03-07 MED ORDER — MELOXICAM 15 MG PO TABS: ORAL_TABLET | ORAL | 1 refills | Status: DC

## 2016-03-07 NOTE — Telephone Encounter (Signed)
Received pharmacy request for Meloxicam. Patient last seen 02/04/16. Please advise.

## 2016-03-16 ENCOUNTER — Ambulatory Visit: Payer: Medicare Other | Attending: Gynecologic Oncology | Admitting: Gynecologic Oncology

## 2016-03-16 ENCOUNTER — Encounter: Payer: Self-pay | Admitting: Gynecologic Oncology

## 2016-03-16 VITALS — BP 168/62 | HR 63 | Temp 98.1°F | Resp 17 | Ht 60.0 in | Wt 140.8 lb

## 2016-03-16 DIAGNOSIS — M545 Low back pain: Secondary | ICD-10-CM | POA: Diagnosis not present

## 2016-03-16 DIAGNOSIS — E7800 Pure hypercholesterolemia, unspecified: Secondary | ICD-10-CM | POA: Insufficient documentation

## 2016-03-16 DIAGNOSIS — J309 Allergic rhinitis, unspecified: Secondary | ICD-10-CM | POA: Diagnosis not present

## 2016-03-16 DIAGNOSIS — G8929 Other chronic pain: Secondary | ICD-10-CM | POA: Insufficient documentation

## 2016-03-16 DIAGNOSIS — N949 Unspecified condition associated with female genital organs and menstrual cycle: Secondary | ICD-10-CM

## 2016-03-16 DIAGNOSIS — I809 Phlebitis and thrombophlebitis of unspecified site: Secondary | ICD-10-CM | POA: Insufficient documentation

## 2016-03-16 DIAGNOSIS — F419 Anxiety disorder, unspecified: Secondary | ICD-10-CM | POA: Insufficient documentation

## 2016-03-16 DIAGNOSIS — N83201 Unspecified ovarian cyst, right side: Secondary | ICD-10-CM

## 2016-03-16 DIAGNOSIS — K219 Gastro-esophageal reflux disease without esophagitis: Secondary | ICD-10-CM | POA: Diagnosis not present

## 2016-03-16 DIAGNOSIS — M8580 Other specified disorders of bone density and structure, unspecified site: Secondary | ICD-10-CM | POA: Insufficient documentation

## 2016-03-16 DIAGNOSIS — N9489 Other specified conditions associated with female genital organs and menstrual cycle: Secondary | ICD-10-CM | POA: Diagnosis not present

## 2016-03-16 DIAGNOSIS — I10 Essential (primary) hypertension: Secondary | ICD-10-CM | POA: Insufficient documentation

## 2016-03-16 DIAGNOSIS — N993 Prolapse of vaginal vault after hysterectomy: Secondary | ICD-10-CM | POA: Insufficient documentation

## 2016-03-16 NOTE — Patient Instructions (Signed)
Please contact our office at [phone] if you are interested in pursuing surgery.

## 2016-03-16 NOTE — Progress Notes (Signed)
Consult Note: Gyn-Onc  Consult was requested by Dr. Phineas Real for the evaluation of [name] 70 y.o. female  CC:  Chief Complaint  Patient presents with  . Right adnexal Cyst    New Consultation    Assessment/Plan:  Ms. Cheryn Salmonson  is a 70 y.o.  year old with a moderately large (8cm) right ovarian mostly simple benign appearing cyst associated with a normal CA 125.  I Discussed with Klaire that I did not feel that her ovarian cyst was malignant. We reviewed the ultrasound images together and I demonstrated the features that were reassuring including smooth and regular walls, mostly cystic components, lack of excrescences, Michael vascularity. We was reviewed and normal tumor marker and the stability in measurements on the cyst over a 5 month period. I discussed the only definitive way to determine this was not malignant was surgical intervention. However given the overall reassuring features on preoperative evaluation I did not feel compelled to subject her to such an intervention.  I discussed that the only real indication for surgery would be of this mass was symptomatic. She does in fact report some intermittent pains in the right side. It is unclear if these are directly related to the cyst. She stands a moderate risk for surgical intervention given her multiple prior surgeries including a right hemicolectomy for diverticular disease, and a laparotomy for hysterectomy, and cholecystectomy. She is somewhat reluctant to undergo major operation and recovery. I feel that most likely we'll be able to accomplish surgery through minimally invasive route with robotic assisted bilateral salpingo-oophorectomy. I discussed anticipated outpatient nature of this procedure anticipated recovery. I did discuss that conversion to laparotomy is occasionally required in which case a substantially longer recovery both inpatient and after discharge is anticipated, and increase competition risk.  After  weighing up the potential risks and benefits of surgery patient is unclear about how she wants to proceed at this point in time. She will consider her options further and if she feels that her symptoms are such that she will desires a surgical intervention she will notify me. I would be happy to operate under such circumstances. I discussed the possibility that pain will not be resolved with surgery if the mass is not the true underlying source of her pain.  Given the overall reassuring features on imaging, if the patient chooses to not have surgery at this time, I do not feel that repeated surveillance imaging is necessary. I would reserve repeat imaging until she develops new symptoms.   HPI: Adilenne Ahles is a very pleasant 70 year old parous woman who is seen in consultation at the request of Dr. Phineas Real for a large right ovarian cysts.  The patient has a long-standing history of bladder prolapse and recurrent UTIs for which she was being treated by Dr.MacDiarmid at [facility] urology. Due to the persistent nature of her symptoms she underwent a CT scan of the abdomen and pelvis to workup this issue further that was performed on 10/29/2015. It revealed an incidental finding of a 7.9 cm right pelvic cystic structure.  Follow-up the evaluation was performed with a repeat ultrasound on 03/02/2016. This revealed the cyst to measure a 0.5 cm in largest dimension with a second smaller cyst within it measuring 13 x 12 mm. Left ovary was normal. The uterus was surgically absent. A CA-125 was drawn and was normal at 6.  The patient reports intermittent sharp right flank and lower abdominal pains. Is unclear what these are associated with, she  thinks maybe with straining or lifting she notices some more. She is chronic back pain issues. She's also had a significant surgical history for a laparotomy with hemicolectomy, laparoscopic cholecystectomy, and total abdominal hysterectomy in her 20s for fibroid uterus  and pelvic pain.  She is no concerning family history that Child psychotherapist sent out is having a likely genetic predisposition to ovarian or breast cancer.  She is next on performance status she takes no regular anticoagulant medications and has no other major medical issues.    Current Meds:  Outpatient Encounter Prescriptions as of 03/16/2016  Medication Sig  . ALPRAZolam (XANAX) 0.5 MG tablet Take 1 tablet (0.5 mg total) by mouth 3 (three) times daily as needed for anxiety.  . docusate sodium (COLACE) 100 MG capsule Take 1 capsule (100 mg total) by mouth 2 (two) times daily.  Marland Kitchen estradiol (ESTRACE VAGINAL) 0.1 MG/GM vaginal cream Apply 1/4 applicator 3 times weekly vaginally.  . fexofenadine (ALLEGRA) 60 MG tablet Take 1 tablet (60 mg total) by mouth 2 (two) times daily.  . fluticasone (FLONASE) 50 MCG/ACT nasal spray Place 2 sprays into both nostrils at bedtime.  Marland Kitchen HYDROcodone-acetaminophen (NORCO/VICODIN) 5-325 MG tablet 1 tab po tid prn pain  . lisinopril (PRINIVIL,ZESTRIL) 10 MG tablet Take 0.5 tablets (5 mg total) by mouth at bedtime. If b/p greater than 140/90 patient takes 10 mg po  . meloxicam (MOBIC) 15 MG tablet 1 tab po qd with food as needed for musculoskeletal pain  . terconazole (TERAZOL 7) 0.4 % vaginal cream Place 1 applicator vaginally at bedtime.  . verapamil (CALAN-SR) 180 MG CR tablet Take 1 tablet (180 mg total) by mouth at bedtime.  Marland Kitchen aspirin 81 MG tablet Take 81 mg by mouth daily.  . [DISCONTINUED] amoxicillin (AMOXIL) 875 MG tablet Take 875 mg by mouth 2 (two) times daily.  . [DISCONTINUED] azithromycin (ZITHROMAX) 250 MG tablet 2 tabs po qd x 1d, then 1 tab po qd x 4d (Patient not taking: Reported on 03/02/2016)  . [DISCONTINUED] levofloxacin (LEVAQUIN) 500 MG tablet Take 1 tablet (500 mg total) by mouth daily. (Patient not taking: Reported on 03/02/2016)  . [DISCONTINUED] NEOMYCIN-POLYMYXIN-HYDROCORTISONE (CORTISPORIN) 1 % SOLN otic solution Place 3 drops into the left ear 4 (four)  times daily. (Patient not taking: Reported on 03/02/2016)   No facility-administered encounter medications on file as of 03/16/2016.     Allergy:  Allergies  Allergen Reactions  . Prednisone     REACTION: funny feeling    Social Hx:   Social History   Social History  . Marital status: Married    Spouse name: [name]  . Number of children: 3  . Years of education: N/A   Occupational History  . cleaning business    Social History Main Topics  . Smoking status: Never Smoker  . Smokeless tobacco: Never Used  . Alcohol use No  . Drug use: No  . Sexual activity: No     Comment: 1st intercourse 13 yo-1 partner   Other Topics Concern  . Not on file   Social History Narrative   Married, has 3 children (one lives near her).  Has 8 grandchildren, 2 greatgrandchildren.   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   No tobacco.  No alcohol.  No drugs.   Exercise: clean, work in yard.        10 siblings in all--5 have passed away--1 child age 67 with whooping cough ,  1 lupus, 1 melanoma,1 lung cancer, 1 pulm fibrosis    Past Surgical Hx:  Past Surgical History:  Procedure Laterality Date  . ABDOMINAL HYSTERECTOMY     TAH  . ANTERIOR AND POSTERIOR REPAIR N/A 03/18/2014   Procedure: Cystocele repair with graft, Vault suspension, Rectocele repair;  Surgeon: [name], MD;  Location: [facility];  Service: Urology;  Laterality: N/A;  . CHOLECYSTECTOMY, LAPAROSCOPIC    . COLONOSCOPY  04/2006   (Dr. [name]): Normal: recall 04/2016 (Dr. [name])  . cspine surgery     Dr. Wiliam Ke level ant cerv discectomy /fusion w/plating  . CYSTO N/A 03/18/2014   Procedure: CYSTO;  Surgeon: [name], MD;  Location: [facility];  Service: Urology;  Laterality: N/A;  . RESECTION OF COLON     BENIGN TUMOR  . right hemicolectomy for diverticulitis with abscess  1993  . TUBAL LIGATION      Past Medical Hx:  Past Medical  History:  Diagnosis Date  . Allergic rhinitis    maple pollen  . Anxiety   . Chronic low back pain    Dr. Trenton Gammon.  Has had back injection--bp elevated after.  . Diverticulosis of colon   . DJD (degenerative joint disease)   . GERD (gastroesophageal reflux disease)   . Herpes zoster 07/08/2014  . Hypercholesteremia    mild; pt declined statin trial 05/2014--needs recheck lipid panel at first f/u visit in 2017  . Hypertension   . Osteopenia 07/2015   T score of -1.4 FRAX15%/2%  . Ovarian cyst 2017   GYN following:  f/u imaging 02/2016 showed stability.  Her GYN referred her to [place] for 2nd opinion 03/02/16.  Marland Kitchen Phlebitis   . Trochanteric bursitis of right hip    Recurrent (injection by Dr. [name] 05/26/15)  . Varicose vein    left leg     Past Gynecological History:  SVD x 2, hysterectomy in her 20's Patient's last menstrual period was 06/27/1972.  Family Hx:  Family History  Problem Relation Age of Onset  . Hypertension Mother   . Breast cancer Sister 63  . Cancer Brother     melanoma  . Cancer Brother     prostate,leukemia  . Lupus Sister   . Other Brother     lung disease  . Other      Family member with MGUS    Review of Systems:  Constitutional  Feels well,    ENT Normal appearing ears and nares bilaterally Skin/Breast  No rash, sores, jaundice, itching, dryness Cardiovascular  No chest pain, shortness of breath, or edema  Pulmonary  No cough or wheeze.  Gastro Intestinal  No nausea, vomitting, or diarrhoea. No bright red blood per rectum, no abdominal pain, change in bowel movement, or constipation.  Genito Urinary  No frequency, urgency, dysuria, occasional lower pelvic right sided pain Musculo Skeletal  No myalgia, arthralgia, joint swelling or pain  Neurologic  No weakness, numbness, change in gait,  Psychology  No depression, anxiety, insomnia.   Vitals:  Blood pressure (!) 168/62, pulse 63, temperature 98.1 F (36.7 C), temperature source Oral,  resp. rate 17, height 5' (1.524 m), weight 140 lb 12.8 oz (63.9 kg), last menstrual period 06/27/1972, SpO2 97 %.  Physical Exam: WD in NAD Neck  Supple NROM, without any enlargements.  Lymph Node Survey No cervical supraclavicular or inguinal adenopathy Cardiovascular  Pulse normal rate, regularity and rhythm. S1 and S2 normal.  Lungs  Clear to auscultation bilateraly, without wheezes/crackles/rhonchi.  Good air movement.  Skin  No rash/lesions/breakdown  Psychiatry  Alert and oriented to person, place, and time  Abdomen  Normoactive bowel sounds, abdomen soft, non-tender and thin without evidence of hernia.  Back No CVA tenderness Genito Urinary  Vulva/vagina: Normal external female genitalia.  No lesions. No discharge or bleeding.  Bladder/urethra:  No lesions or masses, well supported bladder  Vagina: normal  Cervix: surgically absent  Uterus: surgically absent   Adnexa: can appreciate a smooth walled mass on the right pelvis, minimally mobile but not apparently attached to the vaginal cuff or rectum. Rectal  Good tone, no masses no cul de sac nodularity.  Extremities  No bilateral cyanosis, clubbing or edema.   Donaciano Eva, MD  03/16/2016, 5:56 PM

## 2016-03-20 ENCOUNTER — Encounter: Payer: Self-pay | Admitting: Family Medicine

## 2016-03-21 ENCOUNTER — Encounter: Payer: Self-pay | Admitting: Internal Medicine

## 2016-03-21 ENCOUNTER — Ambulatory Visit (INDEPENDENT_AMBULATORY_CARE_PROVIDER_SITE_OTHER): Payer: Medicare Other | Admitting: Internal Medicine

## 2016-03-21 ENCOUNTER — Ambulatory Visit: Payer: Medicare Other | Admitting: Internal Medicine

## 2016-03-21 VITALS — BP 142/76 | HR 68 | Ht 60.0 in | Wt 141.1 lb

## 2016-03-21 DIAGNOSIS — K6289 Other specified diseases of anus and rectum: Secondary | ICD-10-CM | POA: Diagnosis not present

## 2016-03-21 DIAGNOSIS — R194 Change in bowel habit: Secondary | ICD-10-CM | POA: Diagnosis not present

## 2016-03-21 DIAGNOSIS — R159 Full incontinence of feces: Secondary | ICD-10-CM

## 2016-03-21 NOTE — Patient Instructions (Signed)
  You have been scheduled for a colonoscopy. Please follow written instructions given to you at your visit today.  Please pick up your prep supplies at the pharmacy. If you use inhalers (even only as needed), please bring them with you on the day of your procedure.    Today we are giving you a handout on benefiber to read and follow.     I appreciate the opportunity to care for you. Silvano Rusk, MD, [facility]

## 2016-03-21 NOTE — Progress Notes (Signed)
Alison Cuevas 70 y.o. [birth year] [id number]  Assessment & Plan:   Encounter Diagnoses  Name Primary?  . Change in bowel habits Yes  . Fecal incontinence   . Decreased anal sphincter tone    ? Birth trauma and weakened anal sphincter problems Colonoscopy to evaluate for colorectal neoplasia, other problems such as microscopic colitis. Random colon biopsies will be considered. The risks and benefits as well as alternatives of endoscopic procedure(s) have been discussed and reviewed. All questions answered. The patient agrees to proceed.  Benefiber 2 tbsp/day  ? Anorectal mano/Pelvic floor Tx, a upon my exam, and her history of requiring a vault repair cystocele repair and rectocele repair pelvic floor dysfunction seems likely.  She could need therapy directed irritable bowel syndrome, perhaps this change in bowel habits is an exacerbation of previously existing problems. She certainly could have bacterial overgrowth as well given that she's lacking and ileocecal valve.  Other plans pending the above evaluation.  I appreciate the opportunity to care for this patient. CC: MCGOWEN,PHILIP H, MD  Subjective:   Chief Complaint: Diarrhea, leakage of stool, gas  HPI The patient is a very nice married elderly white woman with problems of loose stools, difficulty discerning between stool and gas and having some incontinence and leakage of small amounts of stool intermittently. She wears eye purse for this. There is a history of delivery of 4 large babies and having had episiotomy or other type of cutting during delivery especially with her first child. She has been found to have a right ovarian cyst, she saw a gynecologic oncology who thought that it was most likely benign and that observation was most likely the best course. Dr. Vikki Ports performed a vault prolapse repair cystocele repair and rectocele repair in 2015.  She has chronic difficulty swallowing pills ever since she had neck  surgery. This is unchanged there is no dysphagia to food.  Previous colonoscopy by my retired partner Dr. [name] 2007 showing evidence of right hemicolectomy otherwise unremarkable as did colonoscopies in 2019 94. There is a long history of notes of IBS-like symptoms and him. She with antibiotics and consideration for hydrogen breath testing by Dr. [name]. GI review of systems is otherwise negative.  Allergies  Allergen Reactions  . Prednisone     REACTION: funny feeling   Outpatient Medications Prior to Visit  Medication Sig Dispense Refill  . ALPRAZolam (XANAX) 0.5 MG tablet Take 1 tablet (0.5 mg total) by mouth 3 (three) times daily as needed for anxiety. 90 tablet 5  . aspirin 81 MG tablet Take 81 mg by mouth daily.    Marland Kitchen docusate sodium (COLACE) 100 MG capsule Take 1 capsule (100 mg total) by mouth 2 (two) times daily. 20 capsule 0  . estradiol (ESTRACE VAGINAL) 0.1 MG/GM vaginal cream Apply 1/4 applicator 3 times weekly vaginally. (Patient taking differently: Apply 1/4 applicator 3 times weekly vaginally. As needed) 42.5 g 2  . fexofenadine (ALLEGRA) 60 MG tablet Take 1 tablet (60 mg total) by mouth 2 (two) times daily. 60 tablet 6  . fluticasone (FLONASE) 50 MCG/ACT nasal spray Place 2 sprays into both nostrils at bedtime. 16 g 3  . HYDROcodone-acetaminophen (NORCO/VICODIN) 5-325 MG tablet 1 tab po tid prn pain (Patient taking differently: 1 tab po tid prn pain as needed) 90 tablet 0  . lisinopril (PRINIVIL,ZESTRIL) 10 MG tablet Take 0.5 tablets (5 mg total) by mouth at bedtime. If b/p greater than 140/90 patient takes 10 mg po 90 tablet 3  .  meloxicam (MOBIC) 15 MG tablet 1 tab po qd with food as needed for musculoskeletal pain 90 tablet 1  . verapamil (CALAN-SR) 180 MG CR tablet Take 1 tablet (180 mg total) by mouth at bedtime. 90 tablet 3  . terconazole (TERAZOL 7) 0.4 % vaginal cream Place 1 applicator vaginally at bedtime. 45 g 0   No facility-administered medications prior  to visit.    Past Medical History:  Diagnosis Date  . Allergic rhinitis    maple pollen  . Anxiety   . Arthritis   . Bowel obstruction ([place])   . Chronic low back pain    Dr. Trenton Gammon.  Has had back injection--bp elevated after.  . Diverticulosis of colon   . DJD (degenerative joint disease)   . Gallstones   . GERD (gastroesophageal reflux disease)   . Herpes zoster 07/08/2014  . Hypercholesteremia    mild; pt declined statin trial 05/2014--needs recheck lipid panel at first f/u visit in 2017  . Hypertension   . Osteopenia 07/2015   T score of -1.4 FRAX15%/2%  . Ovarian cyst 2017   GYN following:  f/u imaging 02/2016 showed stability.  Her GYN referred her to [place] for 2nd opinion 03/02/16.  Gyn onc felt surgery likely not necessary but gave pt option of elective resection---pt is thinking about it as of 03/18/16.  Marland Kitchen Phlebitis   . Trochanteric bursitis of right hip    Recurrent (injection by Dr. [name] 05/26/15)  . Varicose vein    left leg    Past Surgical History:  Procedure Laterality Date  . ABDOMINAL HYSTERECTOMY     TAH  . ANTERIOR AND POSTERIOR REPAIR N/A 03/18/2014   Procedure: Cystocele repair with graft, Vault suspension, Rectocele repair;  Surgeon: [name], MD;  Location: [facility];  Service: Urology;  Laterality: N/A;  . CHOLECYSTECTOMY, LAPAROSCOPIC    . COLONOSCOPY  04/2006   (Dr. [name]): Normal: recall 04/2016 (Dr. [name])  . cspine surgery     Dr. Wiliam Ke level ant cerv discectomy /fusion w/plating  . CYSTO N/A 03/18/2014   Procedure: CYSTO;  Surgeon: [name], MD;  Location: [facility];  Service: Urology;  Laterality: N/A;  . [place]     with prolasped bledder repair  . RESECTION OF COLON     BENIGN TUMOR  . right hemicolectomy for diverticulitis with abscess  1993  . TUBAL LIGATION     Social History   Social History  . Marital status: Married    Spouse name: [name]  . Number of children: 3  . Years of education: N/A    Occupational History  . retired    Social History Main Topics  . Smoking status: Never Smoker  . Smokeless tobacco: Never Used  . Alcohol use No  . Drug use: No  . Sexual activity: No     Comment: 1st intercourse 23 yo-1 partner   Other Topics Concern  . None   Social History Narrative   Married, has 3 children (one lives near her).  Has 8 grandchildren, 2 greatgrandchildren.   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   No tobacco.  No alcohol.  No drugs.   Exercise: clean, work in yard.        10 siblings in all--5 have passed away--1 child age 27 with whooping cough , 1 lupus, 1 melanoma,1 lung cancer, 1 pulm fibrosis   Family History  Problem Relation  Age of Onset  . Hypertension Mother   . Diverticulitis Mother   . Breast cancer Sister 48  . Melanoma Brother   . Prostate cancer Brother   . Leukemia Brother   . Lupus Sister   . Lung disease Brother   . Stomach cancer Father   . Other      Family member with MGUS    Review of Systems Positive for those things mentioned above, fatigue, back pain, mild anxiety symptoms, insomnia, allergy symptoms and some recent nosebleeds.  Objective:   Physical Exam @BP  (!) 142/76 (BP Location: Left Arm, Patient Position: Sitting, Cuff Size: Normal)   Pulse 68   Ht 5' (1.524 m) Comment: height measured without shoes  Wt 141 lb 2 oz (64 kg)   LMP 06/27/1972   BMI 27.56 kg/m @  General:  Well-developed, well-nourished and in no acute distress Eyes:  anicteric. ENT:   Mouth and posterior pharynx free of lesions.  Neck:   supple w/o thyromegaly or mass.  Lungs: Clear to auscultation bilaterally. Heart:  S1S2, no rubs, murmurs, gallops. Abdomen:  soft, non-tender, no hepatosplenomegaly, hernia, or mass and BS+.  Rectal:  Patti [place], [place] present.   Anoderm inspection revealed pale pink anoderm Anal wink was absent Digital exam revealed decreased resting tone  and voluntary squeeze. No mass or rectocele present. Simulated defecation with valsalva revealed appropriate abdominal contraction and descent.    Lymph:  no cervical or supraclavicular adenopathy. Extremities:   no edema, cyanosis or clubbing Skin   no rash. Neuro:  A&O x 3.  Psych:  appropriate mood and  Affect.   Data Reviewed:  As per history of present illness. I've reviewed primary care notes, gynecologic oncology notes, routine gynecology notes labs in the EMR for 2016 2017. Her last CBC in December 16 was normal. Her TSH was normal then as well.

## 2016-03-23 ENCOUNTER — Other Ambulatory Visit: Payer: Self-pay | Admitting: Gynecologic Oncology

## 2016-03-24 ENCOUNTER — Telehealth: Payer: Self-pay | Admitting: *Deleted

## 2016-03-24 NOTE — Telephone Encounter (Signed)
Pt had her visit with Dr.Rossi on 03/16/16 for adnexal cyst, is not going to proceed with surgery at this point. Pt is confused about who she follows up with for her yearly exam. Pt wants to confirm that she still sees you for annual and only Dr.Rossi if she wants to proceed with surgery.please advise

## 2016-03-25 NOTE — Telephone Encounter (Signed)
Pt informed with the below note. 

## 2016-03-25 NOTE — Telephone Encounter (Signed)
She should follow up with me routinely.

## 2016-03-28 ENCOUNTER — Ambulatory Visit (INDEPENDENT_AMBULATORY_CARE_PROVIDER_SITE_OTHER): Payer: Medicare Other | Admitting: Otolaryngology

## 2016-03-28 DIAGNOSIS — J343 Hypertrophy of nasal turbinates: Secondary | ICD-10-CM

## 2016-03-28 DIAGNOSIS — J310 Chronic rhinitis: Secondary | ICD-10-CM | POA: Diagnosis not present

## 2016-04-26 ENCOUNTER — Telehealth: Payer: Self-pay | Admitting: *Deleted

## 2016-04-26 DIAGNOSIS — N83201 Unspecified ovarian cyst, right side: Secondary | ICD-10-CM

## 2016-04-26 NOTE — Telephone Encounter (Signed)
I would start with a colonoscopy. She could schedule an ultrasound here. If she would require surgery I probably would recommend with Dr. Denman George in the event that a surprise finding such as cancer could be dealt with all at the same surgery. But regardless I would start with ultrasound and then we can go from there.

## 2016-04-26 NOTE — Telephone Encounter (Signed)
Pt called stating lately this month she has noticed more increased intermittent right side pelvic pain. she did see Dr.Rossi declined surgery as Dr.Rossi thought cyst were not malignant. Pt said her stomach appears swollen a little, normal bowel movements, saw GI in sept. colonoscopy scheduled at the end of NOV. Pt asked when last ultrasound was done here,which was in sept as well. Pt verbalized to me she was confused on who to call regarding this issue. And doesn't know to do, asked if the cyst could have grown since the pain is happening more frequently? Please advise

## 2016-04-27 NOTE — Telephone Encounter (Signed)
Pt informed with the below note, order placed. 

## 2016-05-17 ENCOUNTER — Other Ambulatory Visit: Payer: Self-pay | Admitting: *Deleted

## 2016-05-17 MED ORDER — FLUTICASONE PROPIONATE 50 MCG/ACT NA SUSP: 2.0000 | Freq: Every day | NASAL | 3 refills | Status: DC

## 2016-05-17 NOTE — Telephone Encounter (Signed)
Requesting 90 day supply.  RF request for fluticasone LOV: 02/04/16 Next ov: None Last written: 01/12/16 #16g w/ 3RF

## 2016-05-23 ENCOUNTER — Other Ambulatory Visit: Payer: Self-pay | Admitting: Gynecology

## 2016-05-23 DIAGNOSIS — Z1231 Encounter for screening mammogram for malignant neoplasm of breast: Secondary | ICD-10-CM

## 2016-05-26 ENCOUNTER — Ambulatory Visit (AMBULATORY_SURGERY_CENTER): Payer: Medicare Other | Admitting: Internal Medicine

## 2016-05-26 ENCOUNTER — Encounter: Payer: Self-pay | Admitting: Internal Medicine

## 2016-05-26 ENCOUNTER — Telehealth: Payer: Self-pay

## 2016-05-26 VITALS — BP 127/69 | HR 173 | Temp 98.4°F | Resp 20 | Ht 60.0 in | Wt 141.0 lb

## 2016-05-26 DIAGNOSIS — K5730 Diverticulosis of large intestine without perforation or abscess without bleeding: Secondary | ICD-10-CM | POA: Diagnosis not present

## 2016-05-26 DIAGNOSIS — I10 Essential (primary) hypertension: Secondary | ICD-10-CM | POA: Diagnosis not present

## 2016-05-26 DIAGNOSIS — D649 Anemia, unspecified: Secondary | ICD-10-CM | POA: Diagnosis not present

## 2016-05-26 DIAGNOSIS — F419 Anxiety disorder, unspecified: Secondary | ICD-10-CM | POA: Diagnosis not present

## 2016-05-26 DIAGNOSIS — M797 Fibromyalgia: Secondary | ICD-10-CM | POA: Diagnosis not present

## 2016-05-26 DIAGNOSIS — K219 Gastro-esophageal reflux disease without esophagitis: Secondary | ICD-10-CM | POA: Diagnosis not present

## 2016-05-26 DIAGNOSIS — R194 Change in bowel habit: Secondary | ICD-10-CM | POA: Diagnosis not present

## 2016-05-26 MED ORDER — SODIUM CHLORIDE 0.9 % IV SOLN: 500.0000 mL | INTRAVENOUS | Status: DC

## 2016-05-26 NOTE — Patient Instructions (Addendum)
Nothing bad seen here - diverticulosis and there is a weak anal sphincter as we knew.  I think seeing a physical therapist to see if there is any way exercise and retraining may help you is worth it if you would. Will discuss.  I appreciate the opportunity to care for you. Gatha Mayer, MD, FACG   YOU HAD AN ENDOSCOPIC PROCEDURE TODAY AT [facility]:   Refer to the procedure report that was given to you for any specific questions about what was found during the examination.  If the procedure report does not answer your questions, please call your gastroenterologist to clarify.  If you requested that your care partner not be given the details of your procedure findings, then the procedure report has been included in a sealed envelope for you to review at your convenience later.  YOU SHOULD EXPECT: Some feelings of bloating in the abdomen. Passage of more gas than usual.  Walking can help get rid of the air that was put into your GI tract during the procedure and reduce the bloating. If you had a lower endoscopy (such as a colonoscopy or flexible sigmoidoscopy) you may notice spotting of blood in your stool or on the toilet paper. If you underwent a bowel prep for your procedure, you may not have a normal bowel movement for a few days.  Please Note:  You might notice some irritation and congestion in your nose or some drainage.  This is from the oxygen used during your procedure.  There is no need for concern and it should clear up in a day or so.  SYMPTOMS TO REPORT IMMEDIATELY:   Following lower endoscopy (colonoscopy or flexible sigmoidoscopy):  Excessive amounts of blood in the stool  Significant tenderness or worsening of abdominal pains  Swelling of the abdomen that is new, acute  Fever of 100F or higher    For urgent or emergent issues, a gastroenterologist can be reached at any hour by calling [phone].   DIET:  We do recommend a small meal at  first, but then you may proceed to your regular diet.  Drink plenty of fluids but you should avoid alcoholic beverages for 24 hours.  ACTIVITY:  You should plan to take it easy for the rest of today and you should NOT DRIVE or use heavy machinery until tomorrow (because of the sedation medicines used during the test).    FOLLOW UP: Our staff will call the number listed on your records the next business day following your procedure to check on you and address any questions or concerns that you may have regarding the information given to you following your procedure. If we do not reach you, we will leave a message.  However, if you are feeling well and you are not experiencing any problems, there is no need to return our call.  We will assume that you have returned to your regular daily activities without incident.  If any biopsies were taken you will be contacted by phone or by letter within the next 1-3 weeks.  Please call us at [phone] if you have not heard about the biopsies in 3 weeks.    SIGNATURES/CONFIDENTIALITY: You and/or your care partner have signed paperwork which will be entered into your electronic medical record.  These signatures attest to the fact that that the information above on your After Visit Summary has been reviewed and is understood.  Full responsibility of the confidentiality of this discharge information  lies with you and/or your care-partner.    Dr Celesta Aver nurse will set up consultation with Physical Therapist and call you with this  Information on diverticulosis given to you today

## 2016-05-26 NOTE — Op Note (Signed)
[facility] Patient Name: Alison Cuevas Procedure Date: 05/26/2016 9:33 AM MRN: [id number] Endoscopist: Gatha Mayer , MD Age: 70 Referring MD:  Date of Birth: Jan 06, 1946 Gender: Female Account #: [id number] Procedure:                Colonoscopy Indications:              Change in bowel habits Medicines:                Propofol per Anesthesia, Monitored Anesthesia Care Procedure:                Pre-Anesthesia Assessment:                           - Prior to the procedure, a History and Physical                            was performed, and patient medications and                            allergies were reviewed. The patient's tolerance of                            previous anesthesia was also reviewed. The risks                            and benefits of the procedure and the sedation                            options and risks were discussed with the patient.                            All questions were answered, and informed consent                            was obtained. Prior Anticoagulants: The patient                            last took aspirin 3 days and previous NSAID                            medication 5 days prior to the procedure. ASA Grade                            Assessment: III - A patient with severe systemic                            disease. After reviewing the risks and benefits,                            the patient was deemed in satisfactory condition to                            undergo the procedure.  After obtaining informed consent, the colonoscope                            was passed under direct vision. Throughout the                            procedure, the patient's blood pressure, pulse, and                            oxygen saturations were monitored continuously. The                            Model PCF-H190L [id number]) scope was introduced                            through the anus and advanced to  the the                            ileocolonic anastomosis. The colonoscopy was                            performed without difficulty. The patient tolerated                            the procedure well. The quality of the bowel                            preparation was good. The bowel preparation used                            was Miralax. The rectum and Ileocolonic anastomsis                            areas were photographed. Scope In: 9:54:21 AM Scope Out: 10:09:03 AM Scope Withdrawal Time: 0 hours 7 minutes 11 seconds  Total Procedure Duration: 0 hours 14 minutes 42 seconds  Findings:                 The perianal exam findings include erythematous                            edematous skin.                           The digital rectal exam findings include decreased                            sphincter tone. Pertinent negatives include no anal                            lesion or abnormality was detected.                           There was evidence of a prior end-to-side  ileo-colonic anastomosis in the transverse colon.                            This was patent and was characterized by healthy                            appearing mucosa. The anastomosis was traversed.                           Multiple small and large-mouthed diverticula were                            found in the entire colon.                           The exam was otherwise without abnormality on                            direct and retroflexion views. Complications:            No immediate complications. Estimated Blood Loss:     Estimated blood loss: none. Impression:               - Erythematous edematous skin. found on perianal                            exam.                           - Decreased sphincter tone found on digital rectal                            exam.                           - Patent end-to-side ileo-colonic anastomosis,                             characterized by healthy appearing mucosa.                           - Diverticulosis in the entire examined colon.                           - The examination was otherwise normal on direct                            and retroflexion views.                           - No specimens collected. Recommendation:           - Patient has a contact number available for                            emergencies. The signs and symptoms of potential  delayed complications were discussed with the                            patient. Return to normal activities tomorrow.                            Written discharge instructions were provided to the                            patient.                           - Resume previous diet.                           - Continue present medications.                           - No repeat colonoscopy due to age.                           - Will discuss physical therapy evaluation with her                            - ? if too weak already - other consideration would                            be to see colorectal surgeon for assessment. she                            has fecal incontinence problems. Gatha Mayer, MD 05/26/2016 10:23:21 AM This report has been signed electronically.

## 2016-05-26 NOTE — Progress Notes (Signed)
To PACU, vss patent aw report to rn 

## 2016-05-26 NOTE — Telephone Encounter (Signed)
-----   Message from Gatha Mayer, MD sent at 05/26/2016 10:32 AM EST ----- Regarding: PT referral Please refer to [name] re: fecal incontinence  Would send my last OV and colonoscopy notes to her  Also set up REV me Feb 2018

## 2016-05-26 NOTE — Telephone Encounter (Signed)
Patient is scheduled for 06/06/16 11:00 with [name].  I spoke with her husband and gave him the details

## 2016-05-27 ENCOUNTER — Encounter: Payer: Self-pay | Admitting: Family Medicine

## 2016-05-27 ENCOUNTER — Telehealth: Payer: Self-pay

## 2016-05-27 NOTE — Telephone Encounter (Signed)
  Follow up Call-  Call back number 05/26/2016  Post procedure Call Back phone  # [phone]  Permission to leave phone message Yes  Some recent data might be hidden     Patient questions:  Do you have a fever, pain , or abdominal swelling? No. Pain Score  0 *  Have you tolerated food without any problems? Yes.    Have you been able to return to your normal activities? Yes.    Do you have any questions about your discharge instructions: Diet   No. Medications  No. Follow up visit  No.  Do you have questions or concerns about your Care? No.  Actions: * If pain score is 4 or above: No action needed, pain <4.

## 2016-05-30 ENCOUNTER — Other Ambulatory Visit: Payer: Self-pay

## 2016-05-30 MED ORDER — LISINOPRIL 10 MG PO TABS: 5.0000 mg | ORAL_TABLET | Freq: Every day | ORAL | 0 refills | Status: DC

## 2016-05-30 NOTE — Telephone Encounter (Signed)
Medication refilled

## 2016-06-07 ENCOUNTER — Encounter: Payer: Self-pay | Admitting: Family Medicine

## 2016-06-08 ENCOUNTER — Ambulatory Visit (INDEPENDENT_AMBULATORY_CARE_PROVIDER_SITE_OTHER): Payer: Medicare Other | Admitting: Women's Health

## 2016-06-08 VITALS — BP 115/76

## 2016-06-08 DIAGNOSIS — L739 Follicular disorder, unspecified: Secondary | ICD-10-CM

## 2016-06-08 DIAGNOSIS — L02215 Cutaneous abscess of perineum: Secondary | ICD-10-CM | POA: Diagnosis not present

## 2016-06-08 DIAGNOSIS — B373 Candidiasis of vulva and vagina: Secondary | ICD-10-CM | POA: Diagnosis not present

## 2016-06-08 DIAGNOSIS — B3731 Acute candidiasis of vulva and vagina: Secondary | ICD-10-CM

## 2016-06-08 MED ORDER — SULFAMETHOXAZOLE-TRIMETHOPRIM 800-160 MG PO TABS: 1.0000 | ORAL_TABLET | Freq: Two times a day (BID) | ORAL | 0 refills | Status: DC

## 2016-06-08 MED ORDER — FLUCONAZOLE 150 MG PO TABS: 150.0000 mg | ORAL_TABLET | Freq: Once | ORAL | 1 refills | Status: AC

## 2016-06-08 NOTE — Patient Instructions (Signed)
Bilateral Salpingo-Oophorectomy, Care After Refer to this sheet in the next few weeks. These instructions provide you with information on caring for yourself after your procedure. Your health care provider may also give you more specific instructions. Your treatment has been planned according to current medical practices, but problems sometimes occur. Call your health care provider if you have any problems or questions after your procedure. WHAT TO EXPECT AFTER THE PROCEDURE After your procedure, it is typical to have the following:   Abdominal pain that can be controlled with medicine.  Vaginal spotting.  Constipation.  Menopausal symptoms such as hot flashes, vaginal dryness, and mood swings. HOME CARE INSTRUCTIONS   Get plenty of rest and sleep.  Only take over-the-counter or prescription medicines as directed by your health care provider. Do not take aspirin. It can cause bleeding.  Keep incision areas clean and dry. Remove or change bandages (dressings) only as directed by your health care provider.  Take showers instead of baths for a few weeks as directed by your health care provider.  Limit exercise and activities as directed by your health care provider. Do not lift anything heavier than 5 pounds (2.3 kg) until your health care provider approves.  Do not drive until your health care provider approves.  Follow your health care provider's advice regarding diet. You may be able to resume your usual diet right away.  Drink enough fluids to keep your urine clear or pale yellow.  Do not douche, use tampons, or have sexual intercourse for 6 weeks after the procedure.  Do not drink alcohol until your health care provider says it is okay.  Take your temperature twice a day and write it down.  If you become constipated, you may:  Ask your health care provider about taking a mild laxative.  Add more fruit and bran to your diet.  Drink more fluids.  Follow up with your health  care provider as directed. SEEK MEDICAL CARE IF:   You have swelling, redness, or increasing pain in the incision area.  You see pus coming from the incision area.  You notice a bad smell coming from the wound or dressing.  You have pain, redness, or swelling where the IV access tube was placed.  Your incision is breaking open (the edges are not staying together).  You feel dizzy or feel like fainting.  You develop pain or bleeding when you urinate.  You develop diarrhea.  You develop nausea and vomiting.  You develop abnormal vaginal discharge.  You develop a rash.  You have pain that is not controlled with medicine. SEEK IMMEDIATE MEDICAL CARE IF:   You develop a fever.  You develop abdominal pain.  You have chest pain.  You develop shortness of breath.  You pass out.  You develop pain, swelling, or redness in your leg.  You develop heavy vaginal bleeding with or without blood clots. This information is not intended to replace advice given to you by your health care provider. Make sure you discuss any questions you have with your health care provider. Document Released: 06/13/2005 Document Revised: 02/13/2013 Document Reviewed: 12/05/2012 Elsevier Interactive Patient Education  2017 [employer] Cellulitis, Adult Introduction Cellulitis is a skin infection. The infected area is usually red and sore. This condition occurs most often in the arms and lower legs. It is very important to get treated for this condition. Follow these instructions at home:  Take over-the-counter and prescription medicines only as told by your doctor.  If you were  prescribed an antibiotic medicine, take it as told by your doctor. Do not stop taking the antibiotic even if you start to feel better.  Drink enough fluid to keep your pee (urine) clear or pale yellow.  Do not touch or rub the infected area.  Raise (elevate) the infected area above the level of your heart while you are  sitting or lying down.  Place warm or cold wet cloths (warm or cold compresses) on the infected area. Do this as told by your doctor.  Keep all follow-up visits as told by your doctor. This is important. These visits let your doctor make sure your infection is not getting worse. Contact a doctor if:  You have a fever.  Your symptoms do not get better after 1-2 days of treatment.  Your bone or joint under the infected area starts to hurt after the skin has healed.  Your infection comes back. This can happen in the same area or another area.  You have a swollen bump in the infected area.  You have new symptoms.  You feel ill and also have muscle aches and pains. Get help right away if:  Your symptoms get worse.  You feel very sleepy.  You throw up (vomit) or have watery poop (diarrhea) for a long time.  There are red streaks coming from the infected area.  Your red area gets larger.  Your red area turns darker. This information is not intended to replace advice given to you by your health care provider. Make sure you discuss any questions you have with your health care provider. Document Released: 11/30/2007 Document Revised: 11/19/2015 Document Reviewed: 04/22/2015  2017 Elsevier

## 2016-06-08 NOTE — Progress Notes (Signed)
Presents with complaint of bump in the vaginal area that she noted yesterday while showering, tender. Denies vaginal discharge but occasionally has some itching. Has had increased stool incontinence, wearing depends every day now. Hysterectomy on no HRT, not sexually active. Helps care for her 71 year old husband who  had a stroke. History of an 85 mm right ovarian cyst with low CA-125, had slight increase in size from June to September, has been evaluated by Dr. Denman George and is contemplating removal. Mostly worried about caring for her husband.  Exam: Appears well. External genitalia labias to introitus within normal limits, right side of rectum erythematous 4 cm firm cellulitis, with slight pressure moderate amount of a yellow drainage without odor exuded,  Neosporin applied, relief felt.  Cellulitis right of rectum History of 85 mm right ovarian cyst  Plan: Septra twice daily for 5 days #10, prescription, proper use given. Continue to keep area clean, open to air as able. Diflucan 150 by mouth 1 dose prescription given. Keep scheduled follow-up with Dr. Phineas Real. Instructed to call if no relief.

## 2016-06-09 ENCOUNTER — Other Ambulatory Visit: Payer: Self-pay | Admitting: *Deleted

## 2016-06-09 ENCOUNTER — Encounter: Payer: Self-pay | Admitting: Family Medicine

## 2016-06-09 MED ORDER — VERAPAMIL HCL ER 180 MG PO TBCR: 180.0000 mg | EXTENDED_RELEASE_TABLET | Freq: Every day | ORAL | 0 refills | Status: DC

## 2016-06-09 NOTE — Telephone Encounter (Signed)
Fax from [facility] requesting refill for verapamil  LOV: 06/03/15 NOV: None Last written: 07/02/15 #90 w/ 3RF  Rx sent for #90 w/ 0RF. Pt is due for f/u [facility], needs office visit for more refills.  Pt advised and voiced understanding.  Apt made for 07/01/16 at 1:00pm.

## 2016-06-10 ENCOUNTER — Telehealth: Payer: Self-pay | Admitting: *Deleted

## 2016-06-10 NOTE — Telephone Encounter (Signed)
Pt was seen 06/08/16 for bump in vaginal area, pt said another bump appeared white pus inside. Pt asked if you think okay to pop the bump? And apply Neosporin, pt said she will have her husband do it for her. Please advise

## 2016-06-10 NOTE — Telephone Encounter (Signed)
Telephone call, instructed to take a warm to hot bath, and gently presents limited but encouraged not to mess with it too much. States the area does appear to be coming to a head after antibiotics. Denies fever. Instructed to call if continued problems.

## 2016-06-13 ENCOUNTER — Telehealth: Payer: Self-pay | Admitting: *Deleted

## 2016-06-13 NOTE — Telephone Encounter (Signed)
Pt informed with the below note. 

## 2016-06-13 NOTE — Telephone Encounter (Signed)
As long as no fever, it is getting smaller and drainage is clear continue to watch.  Dr Phineas Real, will evaluate on Fri

## 2016-06-13 NOTE — Telephone Encounter (Signed)
Patient called to follow up from office visit on 06/08/16 has completed antibiotics, bump still there nickel size, pt said clear discharge came out when pushing on bump. Has been doing hot baths as recommended. Pt asked if another dose of antibiotics should be prescribed? Scheduled for annual with Dr.Fontaine on 06/17/16 (friday) please advise

## 2016-06-17 ENCOUNTER — Ambulatory Visit (INDEPENDENT_AMBULATORY_CARE_PROVIDER_SITE_OTHER): Payer: Medicare Other | Admitting: Gynecology

## 2016-06-17 ENCOUNTER — Encounter: Payer: Self-pay | Admitting: Gynecology

## 2016-06-17 VITALS — BP 124/76 | Ht 61.0 in | Wt 142.0 lb

## 2016-06-17 DIAGNOSIS — M899 Disorder of bone, unspecified: Secondary | ICD-10-CM

## 2016-06-17 DIAGNOSIS — N83201 Unspecified ovarian cyst, right side: Secondary | ICD-10-CM | POA: Diagnosis not present

## 2016-06-17 DIAGNOSIS — Z01411 Encounter for gynecological examination (general) (routine) with abnormal findings: Secondary | ICD-10-CM

## 2016-06-17 DIAGNOSIS — M8588 Other specified disorders of bone density and structure, other site: Secondary | ICD-10-CM

## 2016-06-17 DIAGNOSIS — M8580 Other specified disorders of bone density and structure, unspecified site: Secondary | ICD-10-CM

## 2016-06-17 DIAGNOSIS — N952 Postmenopausal atrophic vaginitis: Secondary | ICD-10-CM

## 2016-06-17 NOTE — Progress Notes (Signed)
    Taianna Canell Apr 02, 1946 [id number]        70 y.o.  [phone] for breast and pelvic exam. Patient also has questions about her right ovarian cyst.  Past medical history,surgical history, problem list, medications, allergies, family history and social history were all reviewed and documented as reviewed in the EPIC chart.  ROS:  Performed with pertinent positives and negatives included in the history, assessment and plan.   Additional significant findings :  None   Exam: Caryn Bee assistant Vitals:   06/17/16 1127  BP: 124/76  Weight: 142 lb (64.4 kg)  Height: 5\' 1"  (1.549 m)   Body mass index is 26.83 kg/m.  General appearance:  Normal affect, orientation and appearance. Skin: Grossly normal HEENT: Without gross lesions.  No cervical or supraclavicular adenopathy. Thyroid normal.  Lungs:  Clear without wheezing, rales or rhonchi Cardiac: RR, without RMG Abdominal:  Soft, nontender, without masses, guarding, rebound, organomegaly or hernia Breasts:  Examined lying and sitting without masses, retractions, discharge or axillary adenopathy. Pelvic:  Ext, BUS, Vagina with atrophic changes  Adnexa with fullness in the right pelvis on bimanual consistent with her history of pelvic cyst approximating 8 cm. No tenderness with manipulation   Anus and perineum normal   Rectovaginal normal sphincter tone without palpated masses or tenderness.    Assessment/Plan:  70 y.o. [phone] female for breast and pelvic exam. 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 Bernerd Limbo  1. Right ovarian cyst. 85 mm on ultrasound previously. CA-125 was normal. Had consult with Dr. Denman George who suggested no intervention but to follow. She also suggested no further studies unless symptoms develop. Patient was questioning about doing another ultrasound. I reviewed with her Dr. Serita Grit recommendations. I can palpate the mass although it is somewhat difficulty. The pelvis  but it feels to be approximately 8 cm without significant change from prior measurement. She is asymptomatic from this and I discussed again with her the options to include further studies, observation without studies or intervention with surgery. I discussed the risks of surgery particularly anesthesia in an older individual. I reviewed the issues of missed diagnoses and ovarian cancer. After extensive discussion the patient is comfortable with observation for now. Will follow up if she develops any symptoms or she changes her mind. 2. Colonoscopy 2017. Repeat at their recommended interval. 3. Pap smear 2016. No Pap smear done today. No history of significant abnormal Pap smears. Reviewed current screening guidelines and options to stop screening altogether versus less frequent screening intervals reviewed. Will readdress on annual basis. 4. Mammography due now and patient will follow up for this. SBE monthly reviewed. 5. Osteopenia. DEXA 2017 T score -1.4 FRAX 15%/2%. Increased calcium vitamin D reviewed. Will plan on repeat DEXA next year at 2 year interval. 6. Health maintenance. No routine lab work done as patient reports this done elsewhere. Follow up 1 year, sooner as needed.  Additional time in excess of her breast and pelvic exam was spent in direct face to face counseling and coordination of care in regards to her pelvic cyst and management plan with options reviewed.    [name] MD, 12:00 PM 06/17/2016

## 2016-06-17 NOTE — Patient Instructions (Signed)

## 2016-06-20 ENCOUNTER — Encounter: Payer: Self-pay | Admitting: Family Medicine

## 2016-06-22 ENCOUNTER — Ambulatory Visit (HOSPITAL_COMMUNITY)
Admission: RE | Admit: 2016-06-22 | Discharge: 2016-06-22 | Disposition: A | Discharge status: Home / Self Care | Payer: Medicare Other | Source: Ambulatory Visit | Attending: Gynecology | Admitting: Gynecology

## 2016-06-22 DIAGNOSIS — Z1231 Encounter for screening mammogram for malignant neoplasm of breast: Secondary | ICD-10-CM

## 2016-06-23 ENCOUNTER — Ambulatory Visit (HOSPITAL_COMMUNITY): Payer: Medicare Other

## 2016-06-27 DIAGNOSIS — S8991XA Unspecified injury of right lower leg, initial encounter: Secondary | ICD-10-CM

## 2016-06-27 HISTORY — DX: Unspecified injury of right lower leg, initial encounter: S89.91XA

## 2016-07-01 ENCOUNTER — Ambulatory Visit: Payer: Medicare Other | Admitting: Family Medicine

## 2016-08-03 ENCOUNTER — Other Ambulatory Visit: Payer: Self-pay | Admitting: *Deleted

## 2016-08-03 DIAGNOSIS — H524 Presbyopia: Secondary | ICD-10-CM | POA: Diagnosis not present

## 2016-08-03 DIAGNOSIS — H02403 Unspecified ptosis of bilateral eyelids: Secondary | ICD-10-CM | POA: Diagnosis not present

## 2016-08-03 DIAGNOSIS — H2513 Age-related nuclear cataract, bilateral: Secondary | ICD-10-CM | POA: Diagnosis not present

## 2016-08-03 MED ORDER — ALPRAZOLAM 0.5 MG PO TABS: 0.5000 mg | ORAL_TABLET | Freq: Three times a day (TID) | ORAL | 5 refills | Status: DC | PRN

## 2016-08-03 NOTE — Telephone Encounter (Signed)
Fax from [facility].  RF request for alprazolam LOV: 02/11/16 Acute visit, 06/03/15 last f/u for Anxiety Next ov:  08/08/16 Last written: 12/07/15 #90 w/ 5RF  Please advise. Thanks.

## 2016-08-03 NOTE — Telephone Encounter (Signed)
Rx faxed

## 2016-08-08 ENCOUNTER — Ambulatory Visit (INDEPENDENT_AMBULATORY_CARE_PROVIDER_SITE_OTHER): Payer: Medicare Other | Admitting: Family Medicine

## 2016-08-08 ENCOUNTER — Encounter: Payer: Self-pay | Admitting: Family Medicine

## 2016-08-08 VITALS — BP 121/69 | HR 67 | Temp 98.2°F | Resp 16 | Ht 61.0 in | Wt 140.2 lb

## 2016-08-08 DIAGNOSIS — F411 Generalized anxiety disorder: Secondary | ICD-10-CM

## 2016-08-08 DIAGNOSIS — R5382 Chronic fatigue, unspecified: Secondary | ICD-10-CM

## 2016-08-08 DIAGNOSIS — I10 Essential (primary) hypertension: Secondary | ICD-10-CM

## 2016-08-08 DIAGNOSIS — M797 Fibromyalgia: Secondary | ICD-10-CM

## 2016-08-08 LAB — CBC WITH DIFFERENTIAL/PLATELET
Basophils Absolute: 0 10*3/uL (ref 0.0–0.1)
Basophils Relative: 0.7 % (ref 0.0–3.0)
Eosinophils Absolute: 0.1 10*3/uL (ref 0.0–0.7)
Eosinophils Relative: 1.8 % (ref 0.0–5.0)
HCT: 39.2 % (ref 36.0–46.0)
Hemoglobin: 13.4 g/dL (ref 12.0–15.0)
Lymphocytes Relative: 40.3 % (ref 12.0–46.0)
Lymphs Abs: 2.2 10*3/uL (ref 0.7–4.0)
MCHC: 34.2 g/dL (ref 30.0–36.0)
MCV: 89.3 fl (ref 78.0–100.0)
Monocytes Absolute: 0.3 10*3/uL (ref 0.1–1.0)
Monocytes Relative: 5.8 % (ref 3.0–12.0)
Neutro Abs: 2.8 10*3/uL (ref 1.4–7.7)
Neutrophils Relative %: 51.4 % (ref 43.0–77.0)
Platelets: 195 10*3/uL (ref 150.0–400.0)
RBC: 4.39 Mil/uL (ref 3.87–5.11)
RDW: 13.2 % (ref 11.5–15.5)
WBC: 5.5 10*3/uL (ref 4.0–10.5)

## 2016-08-08 LAB — VITAMIN B12: Vitamin B-12: 1438 pg/mL — ABNORMAL HIGH (ref 211–911)

## 2016-08-08 LAB — COMPREHENSIVE METABOLIC PANEL
ALT: 21 U/L (ref 0–35)
AST: 23 U/L (ref 0–37)
Albumin: 4.2 g/dL (ref 3.5–5.2)
Alkaline Phosphatase: 71 U/L (ref 39–117)
BUN: 15 mg/dL (ref 6–23)
CO2: 26 mEq/L (ref 19–32)
Calcium: 9.4 mg/dL (ref 8.4–10.5)
Chloride: 103 mEq/L (ref 96–112)
Creatinine, Ser: 0.81 mg/dL (ref 0.40–1.20)
GFR: 73.9 mL/min (ref >60.00)
Glucose, Bld: 104 mg/dL — ABNORMAL HIGH (ref 70–99)
Potassium: 4.3 mEq/L (ref 3.5–5.1)
Sodium: 136 mEq/L (ref 135–145)
Total Bilirubin: 0.7 mg/dL (ref 0.2–1.2)
Total Protein: 6.7 g/dL (ref 6.0–8.3)

## 2016-08-08 LAB — TSH: TSH: 1.1 u[IU]/mL (ref 0.35–4.50)

## 2016-08-08 MED ORDER — FLUTICASONE PROPIONATE 0.05 % EX CREA: TOPICAL_CREAM | CUTANEOUS | 1 refills | Status: DC

## 2016-08-08 NOTE — Progress Notes (Signed)
OFFICE VISIT  08/08/2016   CC:  Chief Complaint  Patient presents with  . Follow-up    RCI, pt is fasting.    HPI:    Patient is a 71 y.o. Caucasian female who presents for f/u HTN, anxiety, fibromyalgia pain for which she intermittently takes vicodin for optimal functioning.    HTN: no home monitoring except when feeling bad, says it is always normal.  Fibromyalgia: pain diffusely in soft tissues, worse lately, also with quite a bit of fatigue.  Taking care of chronically ill husband is stressful.  Takes meloxicam daily.  Takes avg of 1-1.5 vicodin per day. Neck and arms burning more lately.  No focal weakness.  Anxiety: takes alprazolam as needed.  Asks for RF of cutivate cream for her perianal dermatitis.  ROS: no BRBPR or melena.  Some intermittent lower abd pain.  No n/v/d or fevers. No joint swelling, redness, or warmth.  Having more migraine HA's lately.  Past Medical History:  Diagnosis Date  . Allergic rhinitis    maple pollen  . Anxiety   . Arthritis   . Bowel obstruction   . Chronic low back pain    Dr. Trenton Gammon.  Has had back injection--bp elevated after.  . Diverticulosis of colon   . DJD (degenerative joint disease)   . Gallstones   . GERD (gastroesophageal reflux disease)   . Herpes zoster 07/08/2014  . Hypercholesteremia    mild; pt declined statin trial 05/2014--needs recheck lipid panel at first f/u visit in 2017  . Hypertension   . Osteopenia 07/2015   T score of -1.4 FRAX15%/2%  . Ovarian cyst 2017   GYN following:  f/u imaging 02/2016 showed stability.  Her GYN referred her to [facility] for 2nd opinion 03/02/16.  Gyn onc felt surgery likely not necessary but gave pt option of elective resection---pt declined and plan as of 05/2016 Dr. Clovis Riley f/u is watchful waiting.  Marland Kitchen Phlebitis   . Trochanteric bursitis of right hip    Recurrent (injection by Dr. [name] 05/26/15)  . Varicose vein    left leg     Past Surgical History:  Procedure Laterality Date   . ABDOMINAL HYSTERECTOMY     TAH  . ANTERIOR AND POSTERIOR REPAIR N/A 03/18/2014   Procedure: Cystocele repair with graft, Vault suspension, Rectocele repair;  Surgeon: [name], MD;  Location: [facility];  Service: Urology;  Laterality: N/A;  . CHOLECYSTECTOMY    . CHOLECYSTECTOMY, LAPAROSCOPIC    . COLON SURGERY    . COLONOSCOPY  04/2006; 05/26/16   2007 (Dr. [name]): Normal.  04/2016 (Dr. [name]) normal except diverticulosis and decreased anal sphincter tone.  No repeat colonoscopy is recommended due to age.  . cspine surgery     Dr. Wiliam Ke level ant cerv discectomy /fusion w/plating  . CYSTO N/A 03/18/2014   Procedure: CYSTO;  Surgeon: [name], MD;  Location: [facility];  Service: Urology;  Laterality: N/A;  . DEXA  07/30/2015   Osteopenia--repeat 2 yrs (Dr. Reino Kent)  . RECTOCELE REPAIR     with prolasped bledder repair  . RESECTION OF COLON     BENIGN TUMOR  . right hemicolectomy for diverticulitis with abscess  1993  . TUBAL LIGATION      Outpatient Medications Prior to Visit  Medication Sig Dispense Refill  . ALPRAZolam (XANAX) 0.5 MG tablet Take 1 tablet (0.5 mg total) by mouth 3 (three) times daily as needed for anxiety. (Patient taking differently: Take 0.5 mg by  mouth 3 (three) times daily as needed. ) 90 tablet 5  . aspirin 81 MG tablet Take 81 mg by mouth daily.    Marland Kitchen estradiol (ESTRACE VAGINAL) 0.1 MG/GM vaginal cream Apply 1/4 applicator 3 times weekly vaginally. (Patient taking differently: Apply 1/4 applicator 3 times weekly vaginally. As needed) 42.5 g 2  . fexofenadine (ALLEGRA) 60 MG tablet Take 1 tablet (60 mg total) by mouth 2 (two) times daily. 60 tablet 6  . fluticasone (FLONASE) 50 MCG/ACT nasal spray Place 2 sprays into both nostrils at bedtime. 48 g 3  . HYDROcodone-acetaminophen (NORCO/VICODIN) 5-325 MG tablet 1 tab po tid prn pain (Patient taking differently: 1 tab po tid prn pain as needed) 90 tablet 0  . lisinopril (PRINIVIL,ZESTRIL) 10  MG tablet Take 0.5 tablets (5 mg total) by mouth at bedtime. If b/p greater than 140/90 patient takes 10 mg po 90 tablet 0  . meloxicam (MOBIC) 15 MG tablet 1 tab po qd with food as needed for musculoskeletal pain 90 tablet 1  . Multiple Vitamin (MULTIVITAMIN) capsule Take 1 capsule by mouth daily.    Marland Kitchen POTASSIUM PO Take by mouth.    . verapamil (CALAN-SR) 180 MG CR tablet Take 1 tablet (180 mg total) by mouth at bedtime. 90 tablet 0   Facility-Administered Medications Prior to Visit  Medication Dose Route Frequency Provider Last Rate Last Dose  . 0.9 %  sodium chloride infusion  500 mL Intravenous Continuous Gatha Mayer, MD        Allergies  Allergen Reactions  . Prednisone     REACTION: funny feeling    ROS As per HPI  PE: Blood pressure 121/69, pulse 67, temperature 98.2 F (36.8 C), temperature source Oral, resp. rate 16, height 5\' 1"  (1.549 m), weight 140 lb 4 oz (63.6 kg), last menstrual period 06/27/1972, SpO2 95 %. Gen: Alert, well appearing.  Patient is oriented to person, place, time, and situation. AFFECT: pleasant, lucid thought and speech. CV: RRR, no m/r/g.   LUNGS: CTA bilat, nonlabored resps, good aeration in all lung fields. MUSC: TTP in paraspinous soft tissues of neck bilat, both traps, both upper arms, elbows, forearms. Both thighs (very mild), both calves.  No joint erythema, warmth, or swelling.  LABS:    Chemistry      Component Value Date/Time   NA 135 06/03/2015 1034   K 4.7 06/03/2015 1034   CL 99 06/03/2015 1034   CO2 26 06/03/2015 1034   BUN 21 06/03/2015 1034   CREATININE 0.87 06/03/2015 1034      Component Value Date/Time   CALCIUM 10.1 06/03/2015 1034   ALKPHOS 66 06/16/2014 1149   AST 24 06/16/2014 1149   ALT 22 06/16/2014 1149   BILITOT 0.9 06/16/2014 1149     Lab Results  Component Value Date   CHOL 162 12/24/2015   HDL 61 12/24/2015   LDLCALC 89 12/24/2015   LDLDIRECT 136.9 09/20/2007   TRIG 60 12/24/2015   CHOLHDL 2.7  12/24/2015   Lab Results  Component Value Date   TSH 2.104 06/03/2015   Lab Results  Component Value Date   WBC 7.8 06/03/2015   HGB 14.6 06/03/2015   HCT 41.7 06/03/2015   MCV 87.8 06/03/2015   PLT 197 06/03/2015   Lab Results  Component Value Date   VITAMINB12 1019 (H) 04/05/2011   IMPRESSION AND PLAN:  1) Fibromyalgia syndrome with chronic fatigue/chronic pain syndrome.  Worse lately, likely related to chronic stress of taking care of  her debilitated husband. Will check and make sure CBC, hepatic panel, TSH, and vit B12 look normal. Indication for chronic opioid: fibromyalgia. Medication and dose: Vicodin 5/325, 1-2 q6h prn. # pills per month: 90 dispensed per rx, last rx filled 02/01/16. Last UDS date: 11/06/13 Pain contract signed (Y/N): yes (12/12/13). Date narcotic database last reviewed (include red flags): today.  No suspicious activity.  2) HTN; The current medical regimen is effective;  continue present plan and medications. Check lytes/cr today. Takes otc potassium supplement.  If potassium normal today will have her d/c this b/c she's having lots of trouble swallowing these pills.  3) Anxiety: she struggles but uses xanax prn, pretty regularly but no sign of misuse/abuse. Continue this med.  An After Visit Summary was printed and given to the patient.  FOLLOW UP: Return in about 4 months (around 12/06/2016) for routine chronic illness f/u.  Signed:  Crissie Sickles, MD           08/08/2016

## 2016-08-08 NOTE — Progress Notes (Signed)
Pre visit review using our clinic review tool, if applicable. No additional management support is needed unless otherwise documented below in the visit note. 

## 2016-08-21 NOTE — Progress Notes (Signed)
Consult Note: Gyn-Onc  Consult was requested by Dr. Phineas Real for the evaluation of Alison Cuevas 71 y.o. female  CC:  Chief Complaint  Patient presents with  . Ovarian mass    Assessment/Plan:  Alison Cuevas  is a 71 y.o.  year old with a moderately large (8cm) right ovarian mostly simple benign appearing cyst associated with a normal CA 125. It now appears to be symptomatic and possibly growing in size.  I recommended surgery with robotic assisted BSO, possible staging. I discussed the anticipated surgical risks and recovery associated with this. The patient is very concerned about convalescence given that she is the primary care giver for her husband.  We will start by obtaining a new [place] to evaluate the change in size of the mass, and repeat the CA 125 today.  She will consider her options.   HPI: Alison Cuevas is a very pleasant 71 year old parous woman who is seen in consultation at the request of Dr. Phineas Real for a large right ovarian cysts.  The patient has a long-standing history of bladder prolapse and recurrent UTIs for which she was being treated by Dr.MacDiarmid at [facility] urology. Due to the persistent nature of her symptoms she underwent a CT scan of the abdomen and pelvis to workup this issue further that was performed on 10/29/2015. It revealed an incidental finding of a 7.9 cm right pelvic cystic structure.  Follow-up the evaluation was performed with a repeat ultrasound on 03/02/2016. This revealed the cyst to measure a 0.5 cm in largest dimension with a second smaller cyst within it measuring 13 x 12 mm. Left ovary was normal. The uterus was surgically absent. A CA-125 was drawn and was normal at 6.  The patient reports intermittent sharp right flank and lower abdominal pains. Is unclear what these are associated with, she thinks maybe with straining or lifting she notices some more. She is chronic back pain issues. She's also had a significant surgical history for  a laparotomy with hemicolectomy, laparoscopic cholecystectomy, and total abdominal hysterectomy in her 20s for fibroid uterus and pelvic pain.  She is no concerning family history that Child psychotherapist sent out is having a likely genetic predisposition to ovarian or breast cancer.  She is next on performance status she takes no regular anticoagulant medications and has no other major medical issues.    Interval Hx:  The patient elected for expectant management as she was worried about the convalescence associated with surgical recovery. In January, 2018 she began experiencing intermittent severe abdominal pains.  Current Meds:  Outpatient Encounter Prescriptions as of 08/22/2016  Medication Sig  . ALPRAZolam (XANAX) 0.5 MG tablet Take 1 tablet (0.5 mg total) by mouth 3 (three) times daily as needed for anxiety. (Patient taking differently: Take 0.5 mg by mouth 3 (three) times daily as needed. )  . aspirin 81 MG tablet Take 81 mg by mouth daily.  Marland Kitchen estradiol (ESTRACE VAGINAL) 0.1 MG/GM vaginal cream Apply 1/4 applicator 3 times weekly vaginally. (Patient taking differently: Apply 1/4 applicator 3 times weekly vaginally. As needed)  . fexofenadine (ALLEGRA) 60 MG tablet Take 1 tablet (60 mg total) by mouth 2 (two) times daily.  . fluticasone (CUTIVATE) 0.05 % cream Apply to affected area bid prn  . fluticasone (FLONASE) 50 MCG/ACT nasal spray Place 2 sprays into both nostrils at bedtime.  Marland Kitchen HYDROcodone-acetaminophen (NORCO/VICODIN) 5-325 MG tablet 1 tab po tid prn pain (Patient taking differently: 1 tab po tid prn pain as needed)  .  lisinopril (PRINIVIL,ZESTRIL) 10 MG tablet Take 0.5 tablets (5 mg total) by mouth at bedtime. If b/p greater than 140/90 patient takes 10 mg po  . meloxicam (MOBIC) 15 MG tablet 1 tab po qd with food as needed for musculoskeletal pain  . Multiple Vitamin (MULTIVITAMIN) capsule Take 1 capsule by mouth daily.  Marland Kitchen POTASSIUM PO Take by mouth.  . verapamil (CALAN-SR) 180 MG CR tablet  Take 1 tablet (180 mg total) by mouth at bedtime.   Facility-Administered Encounter Medications as of 08/22/2016  Medication  . 0.9 %  sodium chloride infusion    Allergy:  Allergies  Allergen Reactions  . Prednisone     REACTION: funny feeling    Social Hx:   Social History   Social History  . Marital status: Married    Spouse name: [name]  . Number of children: 3  . Years of education: N/A   Occupational History  . retired    Social History Main Topics  . Smoking status: Never Smoker  . Smokeless tobacco: Never Used  . Alcohol use No  . Drug use: No  . Sexual activity: No     Comment: 1st intercourse 28 yo-1 partner   Other Topics Concern  . Not on file   Social History Narrative   Married, has 3 children (one lives near her).  Has 8 grandchildren, 2 greatgrandchildren.   Worked cleaning apartments, worked at [employer], was a Secretary/administrator.   She retired at age 74.   No tobacco.  No alcohol.  No drugs.   Exercise: clean, work in yard.        10 siblings in all--5 have passed away--1 child age 39 with whooping cough , 1 lupus, 1 melanoma,1 lung cancer, 1 pulm fibrosis    Past Surgical Hx:  Past Surgical History:  Procedure Laterality Date  . ABDOMINAL HYSTERECTOMY     TAH  . ANTERIOR AND POSTERIOR REPAIR N/A 03/18/2014   Procedure: Cystocele repair with graft, Vault suspension, Rectocele repair;  Surgeon: [name], MD;  Location: [facility];  Service: Urology;  Laterality: N/A;  . CHOLECYSTECTOMY    . CHOLECYSTECTOMY, LAPAROSCOPIC    . COLON SURGERY    . COLONOSCOPY  04/2006; 05/26/16   2007 (Dr. [name]): Normal.  04/2016 (Dr. [name]) normal except diverticulosis and decreased anal sphincter tone.  No repeat colonoscopy is recommended due to age.  . cspine surgery     Dr. Wiliam Ke level ant cerv discectomy /fusion w/plating  . CYSTO N/A 03/18/2014   Procedure: CYSTO;  Surgeon: [name], MD;  Location: [facility];  Service: Urology;   Laterality: N/A;  . DEXA  07/30/2015   Osteopenia--repeat 2 yrs (Dr. Reino Kent)  . RECTOCELE REPAIR     with prolasped bledder repair  . RESECTION OF COLON     BENIGN TUMOR  . right hemicolectomy for diverticulitis with abscess  1993  . TUBAL LIGATION      Past Medical Hx:  Past Medical History:  Diagnosis Date  . Allergic rhinitis    maple pollen  . Anxiety   . Arthritis   . Bowel obstruction   . Chronic low back pain    Dr. Trenton Gammon.  Has had back injection--bp elevated after.  . Diverticulosis of colon   . DJD (degenerative joint disease)   . Gallstones   . GERD (gastroesophageal reflux disease)   . Herpes zoster 07/08/2014  . Hypercholesteremia    mild; pt declined statin trial 05/2014--needs recheck lipid  panel at first f/u visit in 2017  . Hypertension   . Osteopenia 07/2015   T score of -1.4 FRAX15%/2%  . Ovarian cyst 2017   GYN following:  f/u imaging 02/2016 showed stability.  Her GYN referred her to [facility] for 2nd opinion 03/02/16.  Gyn onc felt surgery likely not necessary but gave pt option of elective resection---pt declined and plan as of 05/2016 Dr. Clovis Riley f/u is watchful waiting.  . Perianal dermatitis    prn cutivate  . Phlebitis   . Trochanteric bursitis of right hip    Recurrent (injection by Dr. [name] 05/26/15)  . Varicose vein    left leg     Past Gynecological History:  SVD x 2, hysterectomy in her 20's Patient's last menstrual period was 06/27/1972.  Family Hx:  Family History  Problem Relation Age of Onset  . Hypertension Mother   . Diverticulitis Mother   . Breast cancer Sister 64  . Melanoma Brother   . Prostate cancer Brother   . Leukemia Brother   . Lupus Sister   . Lung disease Brother   . Stomach cancer Father   . Other      Family member with MGUS    Review of Systems:  Constitutional  Feels well,    ENT Normal appearing ears and nares bilaterally Skin/Breast  No rash, sores, jaundice, itching, dryness Cardiovascular  No  chest pain, shortness of breath, or edema  Pulmonary  No cough or wheeze.  Gastro Intestinal  No nausea, vomitting, or diarrhoea. No bright red blood per rectum, no abdominal pain, change in bowel movement, or constipation.  Genito Urinary  No frequency, urgency, dysuria, occasional lower pelvic right sided pain Musculo Skeletal  No myalgia, arthralgia, joint swelling or pain  Neurologic  No weakness, numbness, change in gait,  Psychology  No depression, anxiety, insomnia.   Vitals:  Blood pressure 140/73, pulse 77, temperature 97.7 F (36.5 C), temperature source Oral, resp. rate 18, height 5\' 1"  (1.549 m), weight 140 lb 3.2 oz (63.6 kg), last menstrual period 06/27/1972, SpO2 98 %.  Physical Exam: WD in NAD Neck  Supple NROM, without any enlargements.  Lymph Node Survey No cervical supraclavicular or inguinal adenopathy Cardiovascular  Pulse normal rate, regularity and rhythm. S1 and S2 normal.  Lungs  Clear to auscultation bilateraly, without wheezes/crackles/rhonchi. Good air movement.  Skin  No rash/lesions/breakdown  Psychiatry  Alert and oriented to person, place, and time  Abdomen  Normoactive bowel sounds, abdomen soft, non-tender and thin without evidence of hernia.  Back No CVA tenderness Genito Urinary  Vulva/vagina: Normal external female genitalia.  No lesions. No discharge or bleeding.  Bladder/urethra:  No lesions or masses, well supported bladder  Vagina: normal  Cervix: surgically absent  Uterus: surgically absent   Adnexa: can appreciate a smooth walled mass on the mid pelvis, approximately 10cm, minimally mobile but not apparently attached to the vaginal cuff or rectum. Rectal  Good tone, no masses no cul de sac nodularity.  Extremities  No bilateral cyanosis, clubbing or edema.   Donaciano Eva, MD  08/22/2016, 1:55 PM

## 2016-08-22 ENCOUNTER — Other Ambulatory Visit (HOSPITAL_BASED_OUTPATIENT_CLINIC_OR_DEPARTMENT_OTHER): Payer: Medicare Other

## 2016-08-22 ENCOUNTER — Ambulatory Visit: Payer: Medicare Other | Attending: Gynecologic Oncology | Admitting: Gynecologic Oncology

## 2016-08-22 ENCOUNTER — Encounter: Payer: Self-pay | Admitting: Gynecologic Oncology

## 2016-08-22 VITALS — BP 140/73 | HR 77 | Temp 97.7°F | Resp 18 | Ht 61.0 in | Wt 140.2 lb

## 2016-08-22 DIAGNOSIS — M810 Age-related osteoporosis without current pathological fracture: Secondary | ICD-10-CM | POA: Insufficient documentation

## 2016-08-22 DIAGNOSIS — F419 Anxiety disorder, unspecified: Secondary | ICD-10-CM | POA: Diagnosis not present

## 2016-08-22 DIAGNOSIS — Z888 Allergy status to other drugs, medicaments and biological substances status: Secondary | ICD-10-CM | POA: Insufficient documentation

## 2016-08-22 DIAGNOSIS — Z808 Family history of malignant neoplasm of other organs or systems: Secondary | ICD-10-CM | POA: Diagnosis not present

## 2016-08-22 DIAGNOSIS — Z90710 Acquired absence of both cervix and uterus: Secondary | ICD-10-CM | POA: Diagnosis not present

## 2016-08-22 DIAGNOSIS — Z801 Family history of malignant neoplasm of trachea, bronchus and lung: Secondary | ICD-10-CM | POA: Diagnosis not present

## 2016-08-22 DIAGNOSIS — Z7982 Long term (current) use of aspirin: Secondary | ICD-10-CM | POA: Diagnosis not present

## 2016-08-22 DIAGNOSIS — M1990 Unspecified osteoarthritis, unspecified site: Secondary | ICD-10-CM | POA: Insufficient documentation

## 2016-08-22 DIAGNOSIS — Z9049 Acquired absence of other specified parts of digestive tract: Secondary | ICD-10-CM | POA: Diagnosis not present

## 2016-08-22 DIAGNOSIS — I10 Essential (primary) hypertension: Secondary | ICD-10-CM | POA: Insufficient documentation

## 2016-08-22 DIAGNOSIS — Z800 Family history of malignant neoplasm of digestive organs: Secondary | ICD-10-CM | POA: Diagnosis not present

## 2016-08-22 DIAGNOSIS — E7800 Pure hypercholesterolemia, unspecified: Secondary | ICD-10-CM | POA: Diagnosis not present

## 2016-08-22 DIAGNOSIS — Z8249 Family history of ischemic heart disease and other diseases of the circulatory system: Secondary | ICD-10-CM | POA: Diagnosis not present

## 2016-08-22 DIAGNOSIS — Z803 Family history of malignant neoplasm of breast: Secondary | ICD-10-CM | POA: Insufficient documentation

## 2016-08-22 DIAGNOSIS — Z8042 Family history of malignant neoplasm of prostate: Secondary | ICD-10-CM | POA: Insufficient documentation

## 2016-08-22 DIAGNOSIS — N83201 Unspecified ovarian cyst, right side: Secondary | ICD-10-CM

## 2016-08-22 DIAGNOSIS — K219 Gastro-esophageal reflux disease without esophagitis: Secondary | ICD-10-CM | POA: Diagnosis not present

## 2016-08-22 DIAGNOSIS — M545 Low back pain: Secondary | ICD-10-CM | POA: Insufficient documentation

## 2016-08-22 DIAGNOSIS — Z806 Family history of leukemia: Secondary | ICD-10-CM | POA: Diagnosis not present

## 2016-08-22 DIAGNOSIS — G8929 Other chronic pain: Secondary | ICD-10-CM | POA: Diagnosis not present

## 2016-08-22 DIAGNOSIS — I8392 Asymptomatic varicose veins of left lower extremity: Secondary | ICD-10-CM | POA: Diagnosis not present

## 2016-08-22 DIAGNOSIS — R1909 Other intra-abdominal and pelvic swelling, mass and lump: Secondary | ICD-10-CM | POA: Diagnosis not present

## 2016-08-22 DIAGNOSIS — N83291 Other ovarian cyst, right side: Secondary | ICD-10-CM | POA: Diagnosis not present

## 2016-08-22 NOTE — Patient Instructions (Signed)
You will have blood work today: CA 125. Your ultrasound appointment is scheduled for March 6th at 9 am please arrive at 8:45 with a full bladder to [facility]. Stop at patient registration and they will direct you to the ultrasound department. Our office will contact you with the results.

## 2016-08-23 LAB — CA 125: Cancer Antigen (CA) 125: 8.4 U/mL (ref 0.0–38.1)

## 2016-08-24 ENCOUNTER — Telehealth: Payer: Self-pay

## 2016-08-24 NOTE — Telephone Encounter (Signed)
Pt informed of normal test result. Verbalized understanding. 

## 2016-08-24 NOTE — Telephone Encounter (Signed)
-----   Message from Everitt Amber, MD sent at 08/23/2016 10:51 AM EST ----- Jovita Gamma,  Would you mind letting Ms Mcnorton know that her CA 125 was normal and stable. We are awaiting her [place].  Terrence Dupont

## 2016-08-25 ENCOUNTER — Encounter: Payer: Self-pay | Admitting: Family Medicine

## 2016-08-30 ENCOUNTER — Ambulatory Visit (HOSPITAL_COMMUNITY): Payer: Medicare Other

## 2016-08-30 ENCOUNTER — Ambulatory Visit (HOSPITAL_COMMUNITY)
Admission: RE | Admit: 2016-08-30 | Discharge: 2016-08-30 | Disposition: A | Discharge status: Home / Self Care | Payer: Medicare Other | Source: Ambulatory Visit | Attending: Gynecologic Oncology | Admitting: Gynecologic Oncology

## 2016-08-30 DIAGNOSIS — N83201 Unspecified ovarian cyst, right side: Secondary | ICD-10-CM | POA: Diagnosis not present

## 2016-08-30 DIAGNOSIS — R1909 Other intra-abdominal and pelvic swelling, mass and lump: Secondary | ICD-10-CM

## 2016-08-30 DIAGNOSIS — Z90710 Acquired absence of both cervix and uterus: Secondary | ICD-10-CM | POA: Insufficient documentation

## 2016-09-02 ENCOUNTER — Telehealth: Payer: Self-pay | Admitting: *Deleted

## 2016-09-02 NOTE — Telephone Encounter (Signed)
Pt called for results, discuss with pt MD out of office  Pt will be notified of results upon review by MD. Pt verbalized understanding, no further concerns.

## 2016-09-07 ENCOUNTER — Other Ambulatory Visit: Payer: Self-pay | Admitting: Family Medicine

## 2016-09-07 ENCOUNTER — Telehealth: Payer: Self-pay | Admitting: Gynecologic Oncology

## 2016-09-07 NOTE — Telephone Encounter (Signed)
I discussed with Alison Cuevas the findings of stable 9cm ovarian cyst on [place]. No change in 6 months. Stable, low/normal CA 125.  Her pain symptoms are the same. She is not interested in surgery at this time unless I am concerned that it is a cancer, and I told Alison Cuevas that I did not feel that it was based on these stable results.  I discussed that repeated US's are not necessary unless her symptoms change or she becomes interested in surgery.  Donaciano Eva, MD

## 2016-09-14 ENCOUNTER — Other Ambulatory Visit: Payer: Self-pay | Admitting: *Deleted

## 2016-09-14 MED ORDER — HYDROCODONE-ACETAMINOPHEN 5-325 MG PO TABS: ORAL_TABLET | ORAL | 0 refills | Status: DC

## 2016-09-14 NOTE — Telephone Encounter (Signed)
Rx put up front for p/u. Pt stated that her son is going to p/u Rx on Thursday.

## 2016-09-14 NOTE — Telephone Encounter (Signed)
Pt called requesting refill for hydrocodone/apap.  She stated that her son will be coming by to pick it up.  RF request for hydro/apap LOV: 08/08/16 Next ov: 12/06/16 Last written: 01/25/16 #90 w/ 0RF  Please advise. Thanks.

## 2016-09-24 ENCOUNTER — Other Ambulatory Visit: Payer: Self-pay | Admitting: Family Medicine

## 2016-09-28 DIAGNOSIS — M25561 Pain in right knee: Secondary | ICD-10-CM | POA: Diagnosis not present

## 2016-10-04 ENCOUNTER — Other Ambulatory Visit: Payer: Self-pay | Admitting: Family Medicine

## 2016-10-04 DIAGNOSIS — S8391XD Sprain of unspecified site of right knee, subsequent encounter: Secondary | ICD-10-CM | POA: Diagnosis not present

## 2016-10-04 DIAGNOSIS — M25461 Effusion, right knee: Secondary | ICD-10-CM | POA: Diagnosis not present

## 2016-10-18 DIAGNOSIS — S8391XD Sprain of unspecified site of right knee, subsequent encounter: Secondary | ICD-10-CM | POA: Diagnosis not present

## 2016-10-18 DIAGNOSIS — M25461 Effusion, right knee: Secondary | ICD-10-CM | POA: Diagnosis not present

## 2016-10-20 ENCOUNTER — Telehealth: Payer: Self-pay | Admitting: *Deleted

## 2016-10-20 MED ORDER — CYCLOBENZAPRINE HCL 5 MG PO TABS
ORAL_TABLET | ORAL | 0 refills | Status: DC
Start: 2016-10-20 — End: 2016-12-05

## 2016-10-20 NOTE — Telephone Encounter (Signed)
OK, flexeril rx sent to pharmacy. Pls warn patient that this medication can cause sedation and mild impairment.  It has more potential to do this if taken with pain meds (like hers) and xanax (which she takes).  Use with caution.-thx

## 2016-10-20 NOTE — Telephone Encounter (Signed)
Pt called stated that she hurt her neck/shoulder 3 weeks ago. She was seen at the [facility] in [facility], was told if there was no improvement to see ortho doctor. She seen an ortho doctor in [place] (Dr. Mortimer Fries). Was advised to continue Hydrocodone for pain. She is now requesting Rx for muscle relaxer. [facility]. She stated that is unable to come in because she can not drive for the next week. Please advise. Thanks.

## 2016-10-21 ENCOUNTER — Encounter: Payer: Self-pay | Admitting: Family Medicine

## 2016-10-21 NOTE — Telephone Encounter (Signed)
Pt advised and voiced understanding. She decided to hold off on getting the flexeril due to concerns mentioned below. She has scheduled an apt to see Dr. Anitra Lauth on 10/24/16 at 11:30am. She stated that she is feeling a little better today so she may call and cancel depending on how she does over the weekend.

## 2016-10-24 ENCOUNTER — Ambulatory Visit: Payer: Medicare Other | Admitting: Family Medicine

## 2016-10-25 DIAGNOSIS — M4722 Other spondylosis with radiculopathy, cervical region: Secondary | ICD-10-CM | POA: Diagnosis not present

## 2016-11-07 ENCOUNTER — Other Ambulatory Visit: Payer: Self-pay | Admitting: Orthopedic Surgery

## 2016-11-07 DIAGNOSIS — M4802 Spinal stenosis, cervical region: Secondary | ICD-10-CM

## 2016-11-07 DIAGNOSIS — M542 Cervicalgia: Secondary | ICD-10-CM

## 2016-11-13 ENCOUNTER — Ambulatory Visit
Admission: RE | Admit: 2016-11-13 | Discharge: 2016-11-13 | Disposition: A | Discharge status: Home / Self Care | Payer: Medicare Other | Source: Ambulatory Visit | Attending: Orthopedic Surgery | Admitting: Orthopedic Surgery

## 2016-11-13 DIAGNOSIS — M542 Cervicalgia: Secondary | ICD-10-CM

## 2016-11-13 DIAGNOSIS — M50323 Other cervical disc degeneration at C6-C7 level: Secondary | ICD-10-CM | POA: Diagnosis not present

## 2016-11-13 DIAGNOSIS — M4802 Spinal stenosis, cervical region: Secondary | ICD-10-CM

## 2016-11-15 DIAGNOSIS — M4802 Spinal stenosis, cervical region: Secondary | ICD-10-CM | POA: Diagnosis not present

## 2016-11-15 DIAGNOSIS — M4722 Other spondylosis with radiculopathy, cervical region: Secondary | ICD-10-CM | POA: Diagnosis not present

## 2016-11-29 DIAGNOSIS — M542 Cervicalgia: Secondary | ICD-10-CM | POA: Diagnosis not present

## 2016-11-29 DIAGNOSIS — M4722 Other spondylosis with radiculopathy, cervical region: Secondary | ICD-10-CM | POA: Diagnosis not present

## 2016-11-29 DIAGNOSIS — M4802 Spinal stenosis, cervical region: Secondary | ICD-10-CM | POA: Diagnosis not present

## 2016-11-29 DIAGNOSIS — M5412 Radiculopathy, cervical region: Secondary | ICD-10-CM | POA: Diagnosis not present

## 2016-12-01 DIAGNOSIS — H7291 Unspecified perforation of tympanic membrane, right ear: Secondary | ICD-10-CM

## 2016-12-01 HISTORY — DX: Unspecified perforation of tympanic membrane, right ear: H72.91

## 2016-12-05 ENCOUNTER — Ambulatory Visit (INDEPENDENT_AMBULATORY_CARE_PROVIDER_SITE_OTHER): Payer: Medicare Other | Admitting: Family Medicine

## 2016-12-05 ENCOUNTER — Other Ambulatory Visit: Payer: Self-pay | Admitting: Family Medicine

## 2016-12-05 ENCOUNTER — Encounter: Payer: Self-pay | Admitting: *Deleted

## 2016-12-05 ENCOUNTER — Encounter: Payer: Self-pay | Admitting: Family Medicine

## 2016-12-05 VITALS — BP 130/76 | HR 57 | Temp 97.8°F | Resp 16 | Ht 61.0 in | Wt 135.8 lb

## 2016-12-05 DIAGNOSIS — Z1159 Encounter for screening for other viral diseases: Secondary | ICD-10-CM

## 2016-12-05 DIAGNOSIS — E7800 Pure hypercholesterolemia, unspecified: Secondary | ICD-10-CM

## 2016-12-05 DIAGNOSIS — Z0000 Encounter for general adult medical examination without abnormal findings: Secondary | ICD-10-CM | POA: Diagnosis not present

## 2016-12-05 DIAGNOSIS — G894 Chronic pain syndrome: Secondary | ICD-10-CM

## 2016-12-05 DIAGNOSIS — I10 Essential (primary) hypertension: Secondary | ICD-10-CM

## 2016-12-05 DIAGNOSIS — F419 Anxiety disorder, unspecified: Secondary | ICD-10-CM | POA: Diagnosis not present

## 2016-12-05 MED ORDER — HYDROCODONE-ACETAMINOPHEN 5-325 MG PO TABS: ORAL_TABLET | ORAL | 0 refills | Status: DC

## 2016-12-05 NOTE — Telephone Encounter (Signed)
RF request for Hydro/Apap LOV: 08/08/16 Next ov: 12/05/16 today at 2pm Last written: 09/14/16 #90 w/ 0RF  Please advise. Thanks.

## 2016-12-05 NOTE — Telephone Encounter (Signed)
Patient requesting refill of HYDROcodone-acetaminophen (NORCO/VICODIN) 5-325 MG tablet. ° ° °

## 2016-12-05 NOTE — Progress Notes (Signed)
OFFICE VISIT  12/05/2016   CC:  Chief Complaint  Patient presents with  . Follow-up    RCI, pt is not fasting.    HPI:    Patient is a 71 y.o. Caucasian female who presents for 4 mo f/u HTN, hyperlipidemia, anxiety, and chronic pain syndrome.  HTN: monitors only occ, getting up to 150s lately due to excessive pain.  When not in pain, it is consistently 130s/80s.  HLD: intol of statins.  She declines cholesterol med.  She is not fasting today.  Anxiety: Taking 1 tid prn.  This is helping fine.  She has no excessive sedation when using this with her pain med.  Indication for chronic opioid: chronic low back pain/osteoarthritis as well as chronic neck pain due to DJD. Medication and dose: Vicodin 5/325, 1 tid prn # pills per month: 90 Last UDS date: 11/06/13 Pain contract signed (Y/N): Yes: 2015--updated today. Date narcotic database last reviewed (include red flags): today, no red flags.  Ortho recently put her on celebrex.  Also neurontin but she had adverse rxn this this med (crazy dreams). They are considering intervention such as injection and/or surgery.  May have to have f/u with Dr. Trenton Gammon, her neurosurgeon, again soon.  Past Medical History:  Diagnosis Date  . Allergic rhinitis    maple pollen  . Anxiety   . Arthritis   . Bowel obstruction ([place])   . Chronic low back pain    Dr. Trenton Gammon.  Has had back injection--bp elevated after.  . Diverticulosis of colon   . DJD (degenerative joint disease)   . Gallstones   . GERD (gastroesophageal reflux disease)   . Herpes zoster 07/08/2014  . Hypercholesteremia    mild; pt declined statin trial 05/2014--needs recheck lipid panel at first f/u visit in 2017  . Hypertension   . Osteopenia 07/2015   T score of -1.4 FRAX15%/2%  . Ovarian cyst 2017   GYN following:  f/u imaging 02/2016 showed stability.  Her GYN referred her to [place] for 2nd opinion 03/02/16.  Gyn onc felt surgery likely not necessary but gave pt option of elective  resection---pt declined.  At Dr. Denman George 07/2016 f/u the cyst seemed to be growing by exam; plan is to redo u/s imaging and recheck CA-125, pt to reconsider the option of surgical resection.  . Perianal dermatitis    prn cutivate  . Phlebitis   . Right knee injury 2018   Patellofemoral crush injury--Dr. [name].  . Trochanteric bursitis of right hip    Recurrent (injection by Dr. [name] 05/26/15)  . Varicose vein    left leg     Past Surgical History:  Procedure Laterality Date  . ABDOMINAL HYSTERECTOMY     TAH  . ANTERIOR AND POSTERIOR REPAIR N/A 03/18/2014   Procedure: Cystocele repair with graft, Vault suspension, Rectocele repair;  Surgeon: [name], MD;  Location: [facility];  Service: Urology;  Laterality: N/A;  . CHOLECYSTECTOMY    . CHOLECYSTECTOMY, LAPAROSCOPIC    . COLON SURGERY    . COLONOSCOPY  04/2006; 05/26/16   2007 (Dr. [name]): Normal.  04/2016 (Dr. [name]) normal except diverticulosis and decreased anal sphincter tone.  No repeat colonoscopy is recommended due to age.  . cspine surgery     Dr. Wiliam Ke level ant cerv discectomy /fusion w/plating  . CYSTO N/A 03/18/2014   Procedure: CYSTO;  Surgeon: [name], MD;  Location: [facility];  Service: Urology;  Laterality: N/A;  . DEXA  07/30/2015  Osteopenia--repeat 2 yrs (Dr. Reino Kent)  . RECTOCELE REPAIR     with prolasped bledder repair  . RESECTION OF COLON     BENIGN TUMOR  . right hemicolectomy for diverticulitis with abscess  1993  . TUBAL LIGATION      Outpatient Medications Prior to Visit  Medication Sig Dispense Refill  . ALPRAZolam (XANAX) 0.5 MG tablet Take 1 tablet (0.5 mg total) by mouth 3 (three) times daily as needed for anxiety. (Patient taking differently: Take 0.5 mg by mouth 3 (three) times daily as needed. ) 90 tablet 5  . estradiol (ESTRACE VAGINAL) 0.1 MG/GM vaginal cream Apply 1/4 applicator 3 times weekly vaginally. (Patient taking differently: Apply 1/4 applicator 3 times weekly  vaginally. As needed) 42.5 g 2  . fexofenadine (ALLEGRA) 60 MG tablet Take 1 tablet (60 mg total) by mouth 2 (two) times daily. 60 tablet 6  . fluticasone (CUTIVATE) 0.05 % cream Apply to affected area bid prn 30 g 1  . fluticasone (FLONASE) 50 MCG/ACT nasal spray Place 2 sprays into both nostrils at bedtime. 48 g 3  . lisinopril (PRINIVIL,ZESTRIL) 10 MG tablet TAKE 0.5 TABLETS (5 MG TOTAL) BY MOUTH AT BEDTIME. IF B/P GREATER THAN 140/90 PATIENT TAKES 1 TABLET 90 tablet 0  . Multiple Vitamin (MULTIVITAMIN) capsule Take 1 capsule by mouth daily.    Marland Kitchen POTASSIUM PO Take by mouth.    . verapamil (CALAN-SR) 180 MG CR tablet TAKE 1 TABLET AT BEDTIME NEEDS TO BE SEEN 90 tablet 0  . HYDROcodone-acetaminophen (NORCO/VICODIN) 5-325 MG tablet 1 tab po tid prn pain 90 tablet 0  . aspirin 81 MG tablet Take 81 mg by mouth daily.    . meloxicam (MOBIC) 15 MG tablet TAKE 1 TABLET EVERY DAY WITH FOOD AS NEEDED FOR MUSCULOSKELETAL PAIN (Patient not taking: Reported on 12/05/2016) 90 tablet 1  . cyclobenzaprine (FLEXERIL) 5 MG tablet 1-2 tabs po bid prn tight/painful muscles (Patient not taking: Reported on 12/05/2016) 30 tablet 0   Facility-Administered Medications Prior to Visit  Medication Dose Route Frequency Provider Last Rate Last Dose  . 0.9 %  sodium chloride infusion  500 mL Intravenous Continuous Gatha Mayer, MD        Allergies  Allergen Reactions  . Gabapentin Other (See Comments)    Elevated heart rate and crazy dreams  . Prednisone     REACTION: funny feeling    ROS As per HPI  PE: Blood pressure 130/76, pulse (!) 57, temperature 97.8 F (36.6 C), temperature source Oral, resp. rate 16, height 5\' 1"  (1.549 m), weight 135 lb 12 oz (61.6 kg), last menstrual period 06/27/1972, SpO2 97 %. Gen: Alert, well appearing.  Patient is oriented to person, place, time, and situation. AFFECT: pleasant, lucid thought and speech. NWG:NFAO: no injection, icteris, swelling, or exudate.  EOMI,  PERRLA. Mouth: lips without lesion/swelling.  Oral mucosa pink and moist. Oropharynx without erythema, exudate, or swelling.  CV: RRR, no m/r/g.   LUNGS: CTA bilat, nonlabored resps, good aeration in all lung fields. EXT: no clubbing, cyanosis, or edema.    LABS:  Lab Results  Component Value Date   TSH 1.10 08/08/2016   Lab Results  Component Value Date   WBC 5.5 08/08/2016   HGB 13.4 08/08/2016   HCT 39.2 08/08/2016   MCV 89.3 08/08/2016   PLT 195.0 08/08/2016   Lab Results  Component Value Date   CREATININE 0.81 08/08/2016   BUN 15 08/08/2016   NA 136 08/08/2016  K 4.3 08/08/2016   CL 103 08/08/2016   CO2 26 08/08/2016   Lab Results  Component Value Date   ALT 21 08/08/2016   AST 23 08/08/2016   ALKPHOS 71 08/08/2016   BILITOT 0.7 08/08/2016   Lab Results  Component Value Date   CHOL 162 12/24/2015   Lab Results  Component Value Date   HDL 61 12/24/2015   Lab Results  Component Value Date   LDLCALC 89 12/24/2015   Lab Results  Component Value Date   TRIG 60 12/24/2015   Lab Results  Component Value Date   CHOLHDL 2.7 12/24/2015   IMPRESSION AND PLAN:  1) HTN; The current medical regimen is effective;  continue present plan and medications. Lytes/cr --return when fasting to check this and her FLP.  2) HLD: intolerant of statins.  She declines further trial "unless I absolutely have to". Return in near future for lab visit to check FLP--ordered.  3) Chronic anxiety: stable on xanax 0.5mg  tid prn. No new rx needed for this today.  4) Chronic pain syndrome:  A bit worse lately with worsening neck DJD.  She'll keep working with her orthopedists and possibly get back in with her neurosurgeon if this is necessary. The current medical regimen is effective;  continue present plan and medications. I printed rx's for vicodin 5/325, 1 tab tid prn, #90 today for this month, July 2018, and August 2018.  Appropriate fill on/after date was noted on each  rx. Updated pain contract signed and in chart today. UDS obtained today (it should have hydrocodone and alprazolam in it).  An After Visit Summary was printed and given to the patient.  FOLLOW UP: Return in about 3 months (around 03/07/2017) for routine chronic illness f/u (+chronic pain).  Signed:  Crissie Sickles, MD           12/05/2016

## 2016-12-05 NOTE — Progress Notes (Addendum)
Subjective:   Alison Cuevas is a 71 y.o. female who presents for Medicare Annual (Subsequent) preventive examination.  Review of Systems:  No ROS.  Medicare Wellness Visit. Cardiac Risk Factors include: advanced age (>61men, >59 women);dyslipidemia;hypertension   Sleep patterns: Sleeps 7 hours, does not feel rested d/t neck pain. Nocturia x 1.  Home Safety/Smoke Alarms: Feels safe in home. Smoke alarms in place.  Living environment; residence and Firearm Safety: Lives with husband in 2 story home, rail at steps.  Seat Belt Safety/Bike Helmet: Wears seat belt.   Counseling:   Eye Exam-Last exam < 6 months, yearly [facility] in [facility] exam < 6 months. Every 6 months by Dr. Karl Pock.   Female:   Pap-06/17/2015.        Mammo-06/22/2016, negative.        Dexa scan-07/30/2015, Osteopenia.        CCS-Colonoscopy 05/26/2016, Diverticulosis. No recall.      Objective:     Vitals: BP 130/76 (BP Location: Left Arm, Patient Position: Sitting, Cuff Size: Normal)   Pulse (!) 57   Temp 97.8 F (36.6 C) (Oral)   Resp 16   Ht 5\' 1"  (1.549 m)   Wt 135 lb 12 oz (61.6 kg)   LMP 06/27/1972   SpO2 97%   BMI 25.65 kg/m   Body mass index is 25.65 kg/m.   Tobacco History  Smoking Status  . Never Smoker  Smokeless Tobacco  . Never Used     Counseling given: No   Past Medical History:  Diagnosis Date  . Allergic rhinitis    maple pollen  . Anxiety   . Arthritis   . Bowel obstruction ([place])   . Chronic low back pain    Dr. Trenton Gammon.  Has had back injection--bp elevated after.  . Diverticulosis of colon   . DJD (degenerative joint disease)   . Gallstones   . GERD (gastroesophageal reflux disease)   . Herpes zoster 07/08/2014  . Hypercholesteremia    mild; pt declined statin trial 05/2014--needs recheck lipid panel at first f/u visit in 2017  . Hypertension   . Osteopenia 07/2015   T score of -1.4 FRAX15%/2%  . Ovarian cyst 2017   GYN following:  f/u imaging 02/2016  showed stability.  Her GYN referred her to [place] for 2nd opinion 03/02/16.  Gyn onc felt surgery likely not necessary but gave pt option of elective resection---pt declined.  At Dr. Denman George 07/2016 f/u the cyst seemed to be growing by exam; plan is to redo u/s imaging and recheck CA-125, pt to reconsider the option of surgical resection.  . Perianal dermatitis    prn cutivate  . Phlebitis   . Right knee injury 2018   Patellofemoral crush injury--Dr. [name].  . Trochanteric bursitis of right hip    Recurrent (injection by Dr. [name] 05/26/15)  . Varicose vein    left leg    Past Surgical History:  Procedure Laterality Date  . ABDOMINAL HYSTERECTOMY     TAH  . ANTERIOR AND POSTERIOR REPAIR N/A 03/18/2014   Procedure: Cystocele repair with graft, Vault suspension, Rectocele repair;  Surgeon: [name], MD;  Location: [facility];  Service: Urology;  Laterality: N/A;  . CHOLECYSTECTOMY    . CHOLECYSTECTOMY, LAPAROSCOPIC    . COLON SURGERY    . COLONOSCOPY  04/2006; 05/26/16   2007 (Dr. [name]): Normal.  04/2016 (Dr. [name]) normal except diverticulosis and decreased anal sphincter tone.  No repeat colonoscopy is recommended due to  age.  . cspine surgery     Dr. Wiliam Ke level ant cerv discectomy /fusion w/plating  . CYSTO N/A 03/18/2014   Procedure: CYSTO;  Surgeon: [name], MD;  Location: [facility];  Service: Urology;  Laterality: N/A;  . DEXA  07/30/2015   Osteopenia--repeat 2 yrs (Dr. Reino Kent)  . RECTOCELE REPAIR     with prolasped bledder repair  . RESECTION OF COLON     BENIGN TUMOR  . right hemicolectomy for diverticulitis with abscess  1993  . TUBAL LIGATION     Family History  Problem Relation Age of Onset  . Hypertension Mother   . Diverticulitis Mother   . Breast cancer Sister 12  . Melanoma Brother   . Prostate cancer Brother   . Leukemia Brother   . Lupus Sister   . Lung disease Brother   . Stomach cancer Father   . Other Unknown        Family member  with MGUS   History  Sexual Activity  . Sexual activity: No    Comment: 1st intercourse 44 yo-1 partner    Outpatient Encounter Prescriptions as of 12/05/2016  Medication Sig  . ALPRAZolam (XANAX) 0.5 MG tablet Take 1 tablet (0.5 mg total) by mouth 3 (three) times daily as needed for anxiety. (Patient taking differently: Take 0.5 mg by mouth 3 (three) times daily as needed. )  . Biotin 10000 MCG TABS Take 1 tablet by mouth daily.  . calcium carbonate (OSCAL) 1500 (600 Ca) MG TABS tablet Take 600 mg of elemental calcium by mouth daily with breakfast.  . estradiol (ESTRACE VAGINAL) 0.1 MG/GM vaginal cream Apply 1/4 applicator 3 times weekly vaginally. (Patient taking differently: Apply 1/4 applicator 3 times weekly vaginally. As needed)  . fexofenadine (ALLEGRA) 60 MG tablet Take 1 tablet (60 mg total) by mouth 2 (two) times daily.  . fluticasone (CUTIVATE) 0.05 % cream Apply to affected area bid prn  . fluticasone (FLONASE) 50 MCG/ACT nasal spray Place 2 sprays into both nostrils at bedtime.  Marland Kitchen HYDROcodone-acetaminophen (NORCO/VICODIN) 5-325 MG tablet 1 tab po tid prn pain  . lisinopril (PRINIVIL,ZESTRIL) 10 MG tablet TAKE 0.5 TABLETS (5 MG TOTAL) BY MOUTH AT BEDTIME. IF B/P GREATER THAN 140/90 PATIENT TAKES 1 TABLET  . Multiple Vitamin (MULTIVITAMIN) capsule Take 1 capsule by mouth daily.  Marland Kitchen POTASSIUM PO Take by mouth.  . verapamil (CALAN-SR) 180 MG CR tablet TAKE 1 TABLET AT BEDTIME NEEDS TO BE SEEN  . vitamin B-12 (CYANOCOBALAMIN) 1000 MCG tablet Take 2,000 mcg by mouth daily.  . Vitamin D, Cholecalciferol, 1000 units CAPS Take 5,000 Units by mouth daily.  . [DISCONTINUED] HYDROcodone-acetaminophen (NORCO/VICODIN) 5-325 MG tablet 1 tab po tid prn pain  . [DISCONTINUED] HYDROcodone-acetaminophen (NORCO/VICODIN) 5-325 MG tablet 1 tab po tid prn pain  . aspirin 81 MG tablet Take 81 mg by mouth daily.  . celecoxib (CELEBREX) 200 MG capsule Take 1 capsule by mouth 2 (two) times daily.  .  meloxicam (MOBIC) 15 MG tablet TAKE 1 TABLET EVERY DAY WITH FOOD AS NEEDED FOR MUSCULOSKELETAL PAIN (Patient not taking: Reported on 12/05/2016)  . [DISCONTINUED] cyclobenzaprine (FLEXERIL) 5 MG tablet 1-2 tabs po bid prn tight/painful muscles (Patient not taking: Reported on 12/05/2016)  . [DISCONTINUED] HYDROcodone-acetaminophen (NORCO/VICODIN) 5-325 MG tablet 1 tab po tid prn pain   Facility-Administered Encounter Medications as of 12/05/2016  Medication  . 0.9 %  sodium chloride infusion    Activities of Daily Living In your present state of health, do  you have any difficulty performing the following activities: 12/05/2016  Hearing? N  Vision? N  Difficulty concentrating or making decisions? N  Walking or climbing stairs? N  Dressing or bathing? N  Doing errands, shopping? N  Preparing Food and eating ? N  Using the Toilet? N  In the past six months, have you accidently leaked urine? N  Do you have problems with loss of bowel control? N  Managing your Medications? N  Managing your Finances? N  Housekeeping or managing your Housekeeping? N  Some recent data might be hidden    Patient Care Team: [name], MD as PCP - General (Family Medicine) Bjorn Loser, MD as Consulting Physician (Urology) Phineas Real, Belinda Block, MD as Consulting Physician (Gynecology) Everitt Amber, MD as Consulting Physician (Obstetrics and Gynecology) Gatha Mayer, MD as Consulting Physician (Gastroenterology) Almedia Balls, MD as Consulting Physician (Orthopedic Surgery) Laroy Apple, MD as Referring Physician (Physical Medicine and Rehabilitation) Clista Bernhardt, MD as Referring Physician (Dermatology)    Assessment:    Physical assessment deferred to PCP.  Exercise Activities and Dietary recommendations Current Exercise Habits: Home exercise routine, Type of exercise: walking, Time (Minutes): 20, Frequency (Times/Week): 7, Weekly Exercise (Minutes/Week): 140, Exercise limited by:  None identified   Diet (meal preparation, eat out, water intake, caffeinated beverages, dairy products, fruits and vegetables): Drinks water, tea and soda.   Breakfast: cereal, coffee Lunch: sandwich Dinner: meat and vegetables    Discussed heart healthy diet and exercise.   Goals      Patient Stated   . <enter goal here> (pt-stated)          Maintain current health by remaining active.       Fall Risk Fall Risk  12/05/2016 08/24/2015 12/23/2014 12/12/2012  Falls in the past year? Yes No Yes No  Number falls in past yr: 1 - 1 -  Injury with Fall? No - No -  Follow up Falls prevention discussed - - -   Depression Screen PHQ 2/9 Scores 12/05/2016 08/24/2015 12/23/2014 12/12/2012  PHQ - 2 Score 0 0 0 0     Cognitive Function       Ad8 score reviewed for issues:  Issues making decisions: no  Less interest in hobbies / activities: no  Repeats questions, stories (family complaining): no  Trouble using ordinary gadgets (microwave, computer, phone): no  Forgets the month or year: no  Mismanaging finances: no  Remembering appts: no  Daily problems with thinking and/or memory: no Ad8 score is=0     Immunization History  Administered Date(s) Administered  . Influenza Split 04/27/2013  . Influenza-Unspecified 03/30/2015  . Pneumococcal Conjugate-13 12/23/2014  . Pneumococcal Polysaccharide-23 05/24/2010, 11/16/2010  . Tdap 09/29/2014  . Zoster 11/18/2014   Screening Tests Health Maintenance  Topic Date Due  . Hepatitis C Screening  [birth year]  . INFLUENZA VACCINE  01/25/2017  . MAMMOGRAM  06/22/2018  . TETANUS/TDAP  09/28/2024  . COLONOSCOPY  05/26/2026  . DEXA SCAN  Completed  . PNA vac Low Risk Adult  Completed      Plan:    Continue doing brain stimulating activities (puzzles, reading, adult coloring books, staying active) to keep memory sharp.   I have personally reviewed and noted the following in the patient's chart:   . Medical and social  history . Use of alcohol, tobacco or illicit drugs  . Current medications and supplements . Functional ability and status . Nutritional status . Physical activity . Advanced directives . List  of other physicians . Hospitalizations, surgeries, and ER visits in previous 12 months . Vitals . Screenings to include cognitive, depression, and falls . Referrals and appointments  In addition, I have reviewed and discussed with patient certain preventive protocols, quality metrics, and best practice recommendations. A written personalized care plan for preventive services as well as general preventive health recommendations were provided to patient.     Gerilyn Nestle, RN  12/05/2016

## 2016-12-05 NOTE — Patient Instructions (Addendum)
Continue doing brain stimulating activities (puzzles, reading, adult coloring books, staying active) to keep memory sharp.   Health Maintenance, Female Adopting a healthy lifestyle and getting preventive care can go a long way to promote health and wellness. Talk with your health care provider about what schedule of regular examinations is right for you. This is a good chance for you to check in with your provider about disease prevention and staying healthy. In between checkups, there are plenty of things you can do on your own. Experts have done a lot of research about which lifestyle changes and preventive measures are most likely to keep you healthy. Ask your health care provider for more information. Weight and diet Eat a healthy diet  Be sure to include plenty of vegetables, fruits, low-fat dairy products, and lean protein.  Do not eat a lot of foods high in solid fats, added sugars, or salt.  Get regular exercise. This is one of the most important things you can do for your health. ? Most adults should exercise for at least 150 minutes each week. The exercise should increase your heart rate and make you sweat (moderate-intensity exercise). ? Most adults should also do strengthening exercises at least twice a week. This is in addition to the moderate-intensity exercise.  Maintain a healthy weight  Body mass index (BMI) is a measurement that can be used to identify possible weight problems. It estimates body fat based on height and weight. Your health care provider can help determine your BMI and help you achieve or maintain a healthy weight.  For females 67 years of age and older: ? A BMI below 18.5 is considered underweight. ? A BMI of 18.5 to 24.9 is normal. ? A BMI of 25 to 29.9 is considered overweight. ? A BMI of 30 and above is considered obese.  Watch levels of cholesterol and blood lipids  You should start having your blood tested for lipids and cholesterol at 72 years of  age, then have this test every 5 years.  You may need to have your cholesterol levels checked more often if: ? Your lipid or cholesterol levels are high. ? You are older than 72 years of age. ? You are at high risk for heart disease.  Cancer screening Lung Cancer  Lung cancer screening is recommended for adults 6-47 years old who are at high risk for lung cancer because of a history of smoking.  A yearly low-dose CT scan of the lungs is recommended for people who: ? Currently smoke. ? Have quit within the past 15 years. ? Have at least a 30-pack-year history of smoking. A pack year is smoking an average of one pack of cigarettes a day for 1 year.  Yearly screening should continue until it has been 15 years since you quit.  Yearly screening should stop if you develop a health problem that would prevent you from having lung cancer treatment.  Breast Cancer  Practice breast self-awareness. This means understanding how your breasts normally appear and feel.  It also means doing regular breast self-exams. Let your health care provider know about any changes, no matter how small.  If you are in your 20s or 30s, you should have a clinical breast exam (CBE) by a health care provider every 1-3 years as part of a regular health exam.  If you are 21 or older, have a CBE every year. Also consider having a breast X-ray (mammogram) every year.  If you have a family history of  of breast cancer, talk to your health care provider about genetic screening.  If you are at high risk for breast cancer, talk to your health care provider about having an MRI and a mammogram every year.  Breast cancer gene (BRCA) assessment is recommended for women who have family members with BRCA-related cancers. BRCA-related cancers include: ? Breast. ? Ovarian. ? Tubal. ? Peritoneal cancers.  Results of the assessment will determine the need for genetic counseling and BRCA1 and BRCA2 testing.  Cervical  Cancer Your health care provider may recommend that you be screened regularly for cancer of the pelvic organs (ovaries, uterus, and vagina). This screening involves a pelvic examination, including checking for microscopic changes to the surface of your cervix (Pap test). You may be encouraged to have this screening done every 3 years, beginning at age 21.  For women ages 30-65, health care providers may recommend pelvic exams and Pap testing every 3 years, or they may recommend the Pap and pelvic exam, combined with testing for human papilloma virus (HPV), every 5 years. Some types of HPV increase your risk of cervical cancer. Testing for HPV may also be done on women of any age with unclear Pap test results.  Other health care providers may not recommend any screening for nonpregnant women who are considered low risk for pelvic cancer and who do not have symptoms. Ask your health care provider if a screening pelvic exam is right for you.  If you have had past treatment for cervical cancer or a condition that could lead to cancer, you need Pap tests and screening for cancer for at least 20 years after your treatment. If Pap tests have been discontinued, your risk factors (such as having a new sexual partner) need to be reassessed to determine if screening should resume. Some women have medical problems that increase the chance of getting cervical cancer. In these cases, your health care provider may recommend more frequent screening and Pap tests.  Colorectal Cancer  This type of cancer can be detected and often prevented.  Routine colorectal cancer screening usually begins at 72 years of age and continues through 72 years of age.  Your health care provider may recommend screening at an earlier age if you have risk factors for colon cancer.  Your health care provider may also recommend using home test kits to check for hidden blood in the stool.  A small camera at the end of a tube can be used to  examine your colon directly (sigmoidoscopy or colonoscopy). This is done to check for the earliest forms of colorectal cancer.  Routine screening usually begins at age 50.  Direct examination of the colon should be repeated every 5-10 years through 72 years of age. However, you may need to be screened more often if early forms of precancerous polyps or small growths are found.  Skin Cancer  Check your skin from head to toe regularly.  Tell your health care provider about any new moles or changes in moles, especially if there is a change in a mole's shape or color.  Also tell your health care provider if you have a mole that is larger than the size of a pencil eraser.  Always use sunscreen. Apply sunscreen liberally and repeatedly throughout the day.  Protect yourself by wearing long sleeves, pants, a wide-brimmed hat, and sunglasses whenever you are outside.  Heart disease, diabetes, and high blood pressure  High blood pressure causes heart disease and increases the risk of stroke. High   blood pressure is more likely to develop in: ? People who have blood pressure in the high end of the normal range (130-139/85-89 mm Hg). ? People who are overweight or obese. ? People who are African American.  If you are 18-39 years of age, have your blood pressure checked every 3-5 years. If you are 40 years of age or older, have your blood pressure checked every year. You should have your blood pressure measured twice-once when you are at a hospital or clinic, and once when you are not at a hospital or clinic. Record the average of the two measurements. To check your blood pressure when you are not at a hospital or clinic, you can use: ? An automated blood pressure machine at a pharmacy. ? A home blood pressure monitor.  If you are between 55 years and 79 years old, ask your health care provider if you should take aspirin to prevent strokes.  Have regular diabetes screenings. This involves taking a  blood sample to check your fasting blood sugar level. ? If you are at a normal weight and have a low risk for diabetes, have this test once every three years after 72 years of age. ? If you are overweight and have a high risk for diabetes, consider being tested at a younger age or more often. Preventing infection Hepatitis B  If you have a higher risk for hepatitis B, you should be screened for this virus. You are considered at high risk for hepatitis B if: ? You were born in a country where hepatitis B is common. Ask your health care provider which countries are considered high risk. ? Your parents were born in a high-risk country, and you have not been immunized against hepatitis B (hepatitis B vaccine). ? You have HIV or AIDS. ? You use needles to inject street drugs. ? You live with someone who has hepatitis B. ? You have had sex with someone who has hepatitis B. ? You get hemodialysis treatment. ? You take certain medicines for conditions, including cancer, organ transplantation, and autoimmune conditions.  Hepatitis C  Blood testing is recommended for: ? Everyone born from 1945 through 1965. ? Anyone with known risk factors for hepatitis C.  Sexually transmitted infections (STIs)  You should be screened for sexually transmitted infections (STIs) including gonorrhea and chlamydia if: ? You are sexually active and are younger than 72 years of age. ? You are older than 72 years of age and your health care provider tells you that you are at risk for this type of infection. ? Your sexual activity has changed since you were last screened and you are at an increased risk for chlamydia or gonorrhea. Ask your health care provider if you are at risk.  If you do not have HIV, but are at risk, it may be recommended that you take a prescription medicine daily to prevent HIV infection. This is called pre-exposure prophylaxis (PrEP). You are considered at risk if: ? You are sexually active and  do not regularly use condoms or know the HIV status of your partner(s). ? You take drugs by injection. ? You are sexually active with a partner who has HIV.  Talk with your health care provider about whether you are at high risk of being infected with HIV. If you choose to begin PrEP, you should first be tested for HIV. You should then be tested every 3 months for as long as you are taking PrEP. Pregnancy  If you are   and you may become pregnant, ask your health care provider about preconception counseling.  If you may become pregnant, take 400 to 800 micrograms (mcg) of folic acid every day.  If you want to prevent pregnancy, talk to your health care provider about birth control (contraception). Osteoporosis and menopause  Osteoporosis is a disease in which the bones lose minerals and strength with aging. This can result in serious bone fractures. Your risk for osteoporosis can be identified using a bone density scan.  If you are 76 years of age or older, or if you are at risk for osteoporosis and fractures, ask your health care provider if you should be screened.  Ask your health care provider whether you should take a calcium or vitamin D supplement to lower your risk for osteoporosis.  Menopause may have certain physical symptoms and risks.  Hormone replacement therapy may reduce some of these symptoms and risks. Talk to your health care provider about whether hormone replacement therapy is right for you. Follow these instructions at home:  Schedule regular health, dental, and eye exams.  Stay current with your immunizations.  Do not use any tobacco products including cigarettes, chewing tobacco, or electronic cigarettes.  If you are pregnant, do not drink alcohol.  If you are breastfeeding, limit how much and how often you drink alcohol.  Limit alcohol intake to no more than 1 drink per day for nonpregnant women. One drink equals 12 ounces of beer, 5 ounces of  wine, or 1 ounces of hard liquor.  Do not use street drugs.  Do not share needles.  Ask your health care provider for help if you need support or information about quitting drugs.  Tell your health care provider if you often feel depressed.  Tell your health care provider if you have ever been abused or do not feel safe at home. This information is not intended to replace advice given to you by your health care provider. Make sure you discuss any questions you have with your health care provider. Document Released: 12/27/2010 Document Revised: 11/19/2015 Document Reviewed: 03/17/2015 Elsevier Interactive Patient Education  [employer].

## 2016-12-05 NOTE — Telephone Encounter (Signed)
Pt was given her Rx today at her visit.

## 2016-12-05 NOTE — Addendum Note (Signed)
Addended by: Gerilyn Nestle on: 12/05/2016 03:05 PM   Modules accepted: Orders

## 2016-12-06 ENCOUNTER — Ambulatory Visit: Payer: Medicare Other | Admitting: Family Medicine

## 2016-12-06 ENCOUNTER — Encounter: Payer: Self-pay | Admitting: Family Medicine

## 2016-12-06 DIAGNOSIS — Z79899 Other long term (current) drug therapy: Secondary | ICD-10-CM | POA: Diagnosis not present

## 2016-12-06 DIAGNOSIS — Z79891 Long term (current) use of opiate analgesic: Secondary | ICD-10-CM | POA: Diagnosis not present

## 2016-12-08 ENCOUNTER — Encounter: Payer: Self-pay | Admitting: Family Medicine

## 2016-12-08 ENCOUNTER — Ambulatory Visit (INDEPENDENT_AMBULATORY_CARE_PROVIDER_SITE_OTHER): Payer: Medicare Other | Admitting: Family Medicine

## 2016-12-08 VITALS — BP 115/72 | HR 77 | Temp 97.6°F | Resp 20 | Wt 134.5 lb

## 2016-12-08 DIAGNOSIS — H6121 Impacted cerumen, right ear: Secondary | ICD-10-CM | POA: Diagnosis not present

## 2016-12-08 MED ORDER — CARBAMIDE PEROXIDE 6.5 % OT SOLN: OTIC | 1 refills | Status: DC

## 2016-12-08 NOTE — Progress Notes (Signed)
Alison Cuevas , September 15, 1945, 71 y.o., female MRN: [id number] Patient Care Team    Relationship Specialty Notifications Start End  Alison Cuevas, Alison Blackwater, MD PCP - General Family Medicine  11/06/13    Comment: Alison Small, MD Consulting Physician Urology  11/06/13   Alison Cuevas, Alison Block, MD Consulting Physician Gynecology  11/06/13   Alison Amber, MD Consulting Physician Obstetrics and Gynecology  03/20/16   Alison Mayer, MD Consulting Physician Gastroenterology  03/28/16   Alison Balls, MD Consulting Physician Orthopedic Surgery  10/21/16   Alison Apple, MD Referring Physician Physical Medicine and Rehabilitation  12/05/16   Alison Bernhardt, MD Referring Physician Dermatology  12/05/16     Chief Complaint  Patient presents with  . Ear Pain    right     Subjective: Pt presents for an OV with complaints of right ear fullness of a few days duration.  Associated symptoms include decreased hearing. She denies tinnitus, fever, chills, nausea or pain. She reports it feels like she is in a tunnel.   Depression screen [facility] 2/9 12/05/2016 08/24/2015 12/23/2014 12/12/2012  Decreased Interest 0 0 0 0  Down, Depressed, Hopeless 0 0 0 0  PHQ - 2 Score 0 0 0 0    Allergies  Allergen Reactions  . Gabapentin Other (See Comments)    Elevated heart rate and crazy dreams  . Prednisone     REACTION: funny feeling   Social History  Substance Use Topics  . Smoking status: Never Smoker  . Smokeless tobacco: Never Used  . Alcohol use No   Past Medical History:  Diagnosis Date  . Allergic rhinitis    maple pollen  . Anxiety   . Arthritis   . Bowel obstruction ([place])   . Chronic low back pain    Dr. Trenton Gammon.  Has had back injection--bp elevated after.  . DDD (degenerative disc disease), cervical    Hx of ACDF (Dr. [name]).  Followed by Dr. [name].  Also, Dr. [name] do left C7-T1 intralaminal epidural injection.  . Diverticulosis of colon   . DJD (degenerative  joint disease)   . Gallstones   . GERD (gastroesophageal reflux disease)   . Herpes zoster 07/08/2014  . Hypercholesteremia    mild; pt declined statin trial 05/2014--needs recheck lipid panel at first f/u visit in 2017  . Hypertension   . Osteopenia 07/2015   T score of -1.4 FRAX15%/2%  . Ovarian cyst 2017   GYN following:  f/u imaging 02/2016 showed stability.  Her GYN referred her to [facility] for 2nd opinion 03/02/16.  Gyn onc felt surgery likely not necessary but gave pt option of elective resection---pt declined.  At Dr. Denman George 07/2016 f/u the cyst seemed to be growing by exam; plan is to redo u/s imaging and recheck CA-125, pt to reconsider the option of surgical resection.  . Perianal dermatitis    prn cutivate  . Phlebitis   . Right knee injury 2018   Patellofemoral crush injury--Dr. [name].  . Trochanteric bursitis of right hip    Recurrent (injection by Dr. [name] 05/26/15)  . Varicose vein    left leg    Past Surgical History:  Procedure Laterality Date  . ABDOMINAL HYSTERECTOMY     TAH  . ANTERIOR AND POSTERIOR REPAIR N/A 03/18/2014   Procedure: Cystocele repair with graft, Vault suspension, Rectocele repair;  Surgeon: [name], MD;  Location: [facility];  Service: Urology;  Laterality: N/A;  . CHOLECYSTECTOMY    .  CHOLECYSTECTOMY, LAPAROSCOPIC    . COLON SURGERY    . COLONOSCOPY  04/2006; 05/26/16   2007 (Dr. [name]): Normal.  04/2016 (Dr. [name]) normal except diverticulosis and decreased anal sphincter tone.  No repeat colonoscopy is recommended due to age.  . cspine surgery     Dr. Wiliam Ke level ant cerv discectomy /fusion w/plating  . CYSTO N/A 03/18/2014   Procedure: CYSTO;  Surgeon: [name], MD;  Location: [facility];  Service: Urology;  Laterality: N/A;  . DEXA  07/30/2015   Osteopenia--repeat 2 yrs (Dr. Reino Kent)  . RECTOCELE REPAIR     with prolasped bledder repair  . RESECTION OF COLON     BENIGN TUMOR  . right hemicolectomy for diverticulitis  with abscess  1993  . TUBAL LIGATION     Family History  Problem Relation Age of Onset  . Hypertension Mother   . Diverticulitis Mother   . Breast cancer Sister 48  . Melanoma Brother   . Prostate cancer Brother   . Leukemia Brother   . Lupus Sister   . Lung disease Brother   . Stomach cancer Father   . Other Unknown        Family member with MGUS   Allergies as of 12/08/2016      Reactions   Gabapentin Other (See Comments)   Elevated heart rate and crazy dreams   Prednisone    REACTION: funny feeling      Medication List       Accurate as of 12/08/16  9:02 AM. Always use your most recent med list.          ALPRAZolam 0.5 MG tablet Commonly known as:  XANAX Take 1 tablet (0.5 mg total) by mouth 3 (three) times daily as needed for anxiety.   aspirin 81 MG tablet Take 81 mg by mouth daily.   Biotin 10000 MCG Tabs Take 1 tablet by mouth daily.   calcium carbonate 1500 (600 Ca) MG Tabs tablet Commonly known as:  OSCAL Take 600 mg of elemental calcium by mouth daily with breakfast.   celecoxib 200 MG capsule Commonly known as:  CELEBREX Take 1 capsule by mouth 2 (two) times daily.   estradiol 0.1 MG/GM vaginal cream Commonly known as:  ESTRACE VAGINAL Apply 1/4 applicator 3 times weekly vaginally.   fexofenadine 60 MG tablet Commonly known as:  ALLEGRA Take 1 tablet (60 mg total) by mouth 2 (two) times daily.   fluticasone 0.05 % cream Commonly known as:  CUTIVATE Apply to affected area bid prn   fluticasone 50 MCG/ACT nasal spray Commonly known as:  FLONASE Place 2 sprays into both nostrils at bedtime.   HYDROcodone-acetaminophen 5-325 MG tablet Commonly known as:  NORCO/VICODIN 1 tab po tid prn pain   lisinopril 10 MG tablet Commonly known as:  PRINIVIL,ZESTRIL TAKE 0.5 TABLETS (5 MG TOTAL) BY MOUTH AT BEDTIME. IF B/P GREATER THAN 140/90 PATIENT TAKES 1 TABLET   meloxicam 15 MG tablet Commonly known as:  MOBIC TAKE 1 TABLET EVERY DAY WITH FOOD  AS NEEDED FOR MUSCULOSKELETAL PAIN   multivitamin capsule Take 1 capsule by mouth daily.   POTASSIUM PO Take by mouth.   verapamil 180 MG CR tablet Commonly known as:  CALAN-SR TAKE 1 TABLET AT BEDTIME NEEDS TO BE SEEN   vitamin B-12 1000 MCG tablet Commonly known as:  CYANOCOBALAMIN Take 2,000 mcg by mouth daily.   Vitamin D (Cholecalciferol) 1000 units Caps Take 5,000 Units by mouth daily.  All past medical history, surgical history, allergies, family history, immunizations andmedications were updated in the EMR today and reviewed under the history and medication portions of their EMR.     ROS: Negative, with the exception of above mentioned in HPI   Objective:  BP 115/72 (BP Location: Left Arm, Patient Position: Sitting, Cuff Size: Normal)   Pulse 77   Temp 97.6 F (36.4 C)   Resp 20   Wt 134 lb 8 oz (61 kg)   LMP 06/27/1972   SpO2 96%   BMI 25.41 kg/m  Body mass index is 25.41 kg/m. Gen: Afebrile. No acute distress. Nontoxic in appearance, well developed, well nourished.  HENT: AT. [place]. Bilateral TM visualized unable to be visualized secondary to cerumen.  Eyes:Pupils Equal Round Reactive to light, Extraocular movements intact,  Conjunctiva without redness, discharge or icterus. Neuro: Normal gait.  Alert. Oriented x3   No exam data present No results found. No results found for this or any previous visit (from the past 24 hour(s)).  Assessment/Plan: Makyiah Lie is a 71 y.o. female present for OV for  Impacted cerumen of right ear - irrigation and instrumentation (minimal needed) for removal of ear wax today. Pt felt immediately better and could hear well. EAM and TM without erythema, infection or perforation after cerumen removal.  - discussed use of debrox for the left ear, which is not as bad. Which was prescribed. She can then use about once a mont to keep cerumen to a minimum.  - carbamide peroxide (DEBROX) 6.5 % OTIC solution; Both ears 2 times  daily until wax cleared, then once a month for maintenance.  Dispense: 15 mL; Refill: 1 - F/U PRN  Reviewed expectations re: course of current medical issues.  Discussed self-management of symptoms.  Outlined signs and symptoms indicating need for more acute intervention.  Patient verbalized understanding and all questions were answered.  Patient received an After-Visit Summary.     Note is dictated utilizing voice recognition software. Although note has been proof read prior to signing, occasional typographical errors still can be missed. If any questions arise, please do not hesitate to call for verification.   electronically signed by:  Howard Pouch, DO  [facility]

## 2016-12-08 NOTE — Progress Notes (Signed)
AWV reviewed and agree.  Signed:  Crissie Sickles, MD           12/08/2016

## 2016-12-08 NOTE — Patient Instructions (Addendum)
Use the debrox ear drops twice a day in left ear for about a week or until wax is cleared from that ear too. The was was completely removed from the right today.  Then use drops about once a month to keep cleared.    Earwax Buildup, Adult The ears produce a substance called earwax that helps keep bacteria out of the ear and protects the skin in the ear canal. Occasionally, earwax can build up in the ear and cause discomfort or hearing loss. What increases the risk? This condition is more likely to develop in people who:  Are female.  Are elderly.  Naturally produce more earwax.  Clean their ears often with cotton swabs.  Use earplugs often.  Use in-ear headphones often.  Wear hearing aids.  Have narrow ear canals.  Have earwax that is overly thick or sticky.  Have eczema.  Are dehydrated.  Have excess hair in the ear canal.  What are the signs or symptoms? Symptoms of this condition include:  Reduced or muffled hearing.  A feeling of fullness in the ear or feeling that the ear is plugged.  Fluid coming from the ear.  Ear pain.  Ear itch.  Ringing in the ear.  Coughing.  An obvious piece of earwax that can be seen inside the ear canal.  How is this diagnosed? This condition may be diagnosed based on:  Your symptoms.  Your medical history.  An ear exam. During the exam, your health care provider will look into your ear with an instrument called an otoscope.  You may have tests, including a hearing test. How is this treated? This condition may be treated by:  Using ear drops to soften the earwax.  Having the earwax removed by a health care provider. The health care provider may: ? Flush the ear with water. ? Use an instrument that has a loop on the end (curette). ? Use a suction device.  Surgery to remove the wax buildup. This may be done in severe cases.  Follow these instructions at home:  Take over-the-counter and prescription medicines only as  told by your health care provider.  Do not put any objects, including cotton swabs, into your ear. You can clean the opening of your ear canal with a washcloth or facial tissue.  Follow instructions from your health care provider about cleaning your ears. Do not over-clean your ears.  Drink enough fluid to keep your urine clear or pale yellow. This will help to thin the earwax.  Keep all follow-up visits as told by your health care provider. If earwax builds up in your ears often or if you use hearing aids, consider seeing your health care provider for routine, preventive ear cleanings. Ask your health care provider how often you should schedule your cleanings.  If you have hearing aids, clean them according to instructions from the manufacturer and your health care provider. Contact a health care provider if:  You have ear pain.  You develop a fever.  You have blood, pus, or other fluid coming from your ear.  You have hearing loss.  You have ringing in your ears that does not go away.  Your symptoms do not improve with treatment.  You feel like the room is spinning (vertigo). Summary  Earwax can build up in the ear and cause discomfort or hearing loss.  The most common symptoms of this condition include reduced or muffled hearing and a feeling of fullness in the ear or feeling that the  ear is plugged.  This condition may be diagnosed based on your symptoms, your medical history, and an ear exam.  This condition may be treated by using ear drops to soften the earwax or by having the earwax removed by a health care provider.  Do not put any objects, including cotton swabs, into your ear. You can clean the opening of your ear canal with a washcloth or facial tissue. This information is not intended to replace advice given to you by your health care provider. Make sure you discuss any questions you have with your health care provider. Document Released: 07/21/2004 Document Revised:  08/24/2016 Document Reviewed: 08/24/2016 Elsevier Interactive Patient Education  [employer].

## 2016-12-09 ENCOUNTER — Telehealth: Payer: Self-pay | Admitting: Family Medicine

## 2016-12-09 NOTE — Telephone Encounter (Signed)
Patient Name: Alison Cuevas DOB: 17-Apr-1946 Initial Comment Caller states MD cleaned her ear out yesterday, now having stabbing pains, itching, and burning. Nurse Assessment Nurse: Hardin Negus, RN, Estill Bamberg Date/Time (Eastern Time): 12/09/2016 10:14:32 AM Confirm and document reason for call. If symptomatic, describe symptoms. ---Caller states MD cleaned her ear out yesterday, now having stabbing pains, itching, and burning on her right ear. Does the patient have any new or worsening symptoms? ---Yes Will a triage be completed? ---Yes Related visit to physician within the last 2 weeks? ---No Does the PT have any chronic conditions? (i.e. diabetes, asthma, etc.) ---No Is this a behavioral health or substance abuse call? ---No Guidelines Guideline Title Affirmed Question Affirmed Notes Earache Mild earache and ear congestion (fullness) occurring during air travel Final Disposition User [place], RN, Estill Bamberg Disagree/Comply: Leta Baptist

## 2016-12-09 NOTE — Telephone Encounter (Signed)
Left a message for patient letting her know if symptoms persist she can call to schedule an appt for Saturday  Clinic or be seen at an urgent care over the weekend.

## 2016-12-10 ENCOUNTER — Other Ambulatory Visit: Payer: Self-pay | Admitting: Family Medicine

## 2016-12-10 DIAGNOSIS — H7291 Unspecified perforation of tympanic membrane, right ear: Secondary | ICD-10-CM | POA: Diagnosis not present

## 2016-12-12 ENCOUNTER — Encounter: Payer: Self-pay | Admitting: Family Medicine

## 2016-12-12 ENCOUNTER — Ambulatory Visit (INDEPENDENT_AMBULATORY_CARE_PROVIDER_SITE_OTHER): Payer: Medicare Other | Admitting: Family Medicine

## 2016-12-12 VITALS — BP 129/86 | HR 97 | Temp 98.2°F | Resp 16 | Ht 61.0 in | Wt 134.0 lb

## 2016-12-12 DIAGNOSIS — H60311 Diffuse otitis externa, right ear: Secondary | ICD-10-CM

## 2016-12-12 MED ORDER — AMOXICILLIN-POT CLAVULANATE 875-125 MG PO TABS: 1.0000 | ORAL_TABLET | Freq: Two times a day (BID) | ORAL | 0 refills | Status: DC

## 2016-12-12 NOTE — Progress Notes (Signed)
OFFICE VISIT  12/12/2016   CC:  Chief Complaint  Patient presents with  . Ear Pain   HPI:    Patient is a 71 y.o. Caucasian female who presents for ear pain. She got R ear cerumen impaction removed here 12/08/16. Jabbing pain in R ear and area anterior and posterior to it, started the night of her ear irrigation.  It still feels stopped up. [facility] and was rx'd ciprodex drops.  Past Medical History:  Diagnosis Date  . Allergic rhinitis    maple pollen  . Anxiety   . Arthritis   . Bowel obstruction ([place])   . Chronic low back pain    Dr. Trenton Gammon.  Has had back injection--bp elevated after.  . DDD (degenerative disc disease), cervical    Hx of ACDF (Dr. [name]).  Followed by Dr. [name].  Also, Dr. [name] do left C7-T1 intralaminal epidural injection.  . Diverticulosis of colon   . DJD (degenerative joint disease)   . Gallstones   . GERD (gastroesophageal reflux disease)   . Herpes zoster 07/08/2014  . Hypercholesteremia    mild; pt declined statin trial 05/2014--needs recheck lipid panel at first f/u visit in 2017  . Hypertension   . Osteopenia 07/2015   T score of -1.4 FRAX15%/2%  . Ovarian cyst 2017   GYN following:  f/u imaging 02/2016 showed stability.  Her GYN referred her to [place] for 2nd opinion 03/02/16.  Gyn onc felt surgery likely not necessary but gave pt option of elective resection---pt declined.  At Dr. Denman George 07/2016 f/u the cyst seemed to be growing by exam; plan is to redo u/s imaging and recheck CA-125, pt to reconsider the option of surgical resection.  . Perianal dermatitis    prn cutivate  . Phlebitis   . Right knee injury 2018   Patellofemoral crush injury--Dr. [name].  . Trochanteric bursitis of right hip    Recurrent (injection by Dr. [name] 05/26/15)  . Varicose vein    left leg     Past Surgical History:  Procedure Laterality Date  . ABDOMINAL HYSTERECTOMY     TAH  . ANTERIOR AND POSTERIOR REPAIR N/A  03/18/2014   Procedure: Cystocele repair with graft, Vault suspension, Rectocele repair;  Surgeon: [name], MD;  Location: [facility];  Service: Urology;  Laterality: N/A;  . CHOLECYSTECTOMY    . CHOLECYSTECTOMY, LAPAROSCOPIC    . COLON SURGERY    . COLONOSCOPY  04/2006; 05/26/16   2007 (Dr. [name]): Normal.  04/2016 (Dr. [name]) normal except diverticulosis and decreased anal sphincter tone.  No repeat colonoscopy is recommended due to age.  . cspine surgery     Dr. Wiliam Ke level ant cerv discectomy /fusion w/plating  . CYSTO N/A 03/18/2014   Procedure: CYSTO;  Surgeon: [name], MD;  Location: [facility];  Service: Urology;  Laterality: N/A;  . DEXA  07/30/2015   Osteopenia--repeat 2 yrs (Dr. Reino Kent)  . RECTOCELE REPAIR     with prolasped bledder repair  . RESECTION OF COLON     BENIGN TUMOR  . right hemicolectomy for diverticulitis with abscess  1993  . TUBAL LIGATION      Outpatient Medications Prior to Visit  Medication Sig Dispense Refill  . ALPRAZolam (XANAX) 0.5 MG tablet Take 1 tablet (0.5 mg total) by mouth 3 (three) times daily as needed for anxiety. (Patient taking differently: Take 0.5 mg by mouth 3 (three) times daily as needed. ) 90 tablet  5  . aspirin 81 MG tablet Take 81 mg by mouth daily.    . Biotin 10000 MCG TABS Take 1 tablet by mouth daily.    . calcium carbonate (OSCAL) 1500 (600 Ca) MG TABS tablet Take 600 mg of elemental calcium by mouth daily with breakfast.    . estradiol (ESTRACE VAGINAL) 0.1 MG/GM vaginal cream Apply 1/4 applicator 3 times weekly vaginally. (Patient taking differently: Apply 1/4 applicator 3 times weekly vaginally. As needed) 42.5 g 2  . fexofenadine (ALLEGRA) 60 MG tablet Take 1 tablet (60 mg total) by mouth 2 (two) times daily. 60 tablet 6  . fluticasone (CUTIVATE) 0.05 % cream Apply to affected area bid prn 30 g 1  . fluticasone (FLONASE) 50 MCG/ACT nasal spray Place 2 sprays into both nostrils at bedtime. 48 g 3  .  HYDROcodone-acetaminophen (NORCO/VICODIN) 5-325 MG tablet 1 tab po tid prn pain 90 tablet 0  . lisinopril (PRINIVIL,ZESTRIL) 10 MG tablet TAKE 0.5 TABLETS (5 MG TOTAL) BY MOUTH AT BEDTIME. IF B/P GREATER THAN 140/90 PATIENT TAKES 1 TABLET 90 tablet 0  . meloxicam (MOBIC) 15 MG tablet TAKE 1 TABLET EVERY DAY WITH FOOD AS NEEDED FOR MUSCULOSKELETAL PAIN 90 tablet 1  . Multiple Vitamin (MULTIVITAMIN) capsule Take 1 capsule by mouth daily.    Marland Kitchen POTASSIUM PO Take by mouth.    . verapamil (CALAN-SR) 180 MG CR tablet TAKE 1 TABLET AT BEDTIME NEEDS TO BE SEEN 90 tablet 1  . vitamin B-12 (CYANOCOBALAMIN) 1000 MCG tablet Take 2,000 mcg by mouth daily.    . Vitamin D, Cholecalciferol, 1000 units CAPS Take 5,000 Units by mouth daily.    . carbamide peroxide (DEBROX) 6.5 % OTIC solution Both ears 2 times daily until wax cleared, then once a month for maintenance. (Patient not taking: Reported on 12/12/2016) 15 mL 1  . celecoxib (CELEBREX) 200 MG capsule Take 1 capsule by mouth 2 (two) times daily.  0   Facility-Administered Medications Prior to Visit  Medication Dose Route Frequency Provider Last Rate Last Dose  . 0.9 %  sodium chloride infusion  500 mL Intravenous Continuous Gatha Mayer, MD        Allergies  Allergen Reactions  . Gabapentin Other (See Comments)    Elevated heart rate and crazy dreams  . Prednisone     REACTION: funny feeling    ROS As per HPI  PE: Blood pressure 129/86, pulse 97, temperature 98.2 F (36.8 C), temperature source Oral, resp. rate 16, height 5\' 1"  (1.549 m), weight 134 lb (60.8 kg), last menstrual period 06/27/1972, SpO2 96 %. Gen: Alert, well appearing.  Patient is oriented to person, place, time, and situation. AFFECT: pleasant, lucid thought and speech. Right ear without erythema or swelling of outer ear anatomy.  No tenderness to touch.  Mild discomfort in EAC when ear speculum was inserted for exam. EAC walls with minimal erythema and swelling.  Generous  amount of white, lumpy exudate filling 75% of the canal.  Visualized portion of the TM appeared normal.  LABS:  none  IMPRESSION AND PLAN:  Acute otitis externa: sounds like the drops are not staying in her ear well. Needs ear wicks but unfortunately we don't have any in our office. I fashioned a wick out of a small piece of sterile gauze today, put her antibiotic drops in on top of this. Rx'd augmentin 875mg  bid x 10d.  Continue ciprodex drops bid. She'll pull the wick out tomorrow and continue drops without wick  after that.  An After Visit Summary was printed and given to the patient.  FOLLOW UP: Return if symptoms worsen or fail to improve.  Signed:  Crissie Sickles, MD           12/12/2016

## 2016-12-12 NOTE — Telephone Encounter (Signed)
[facility]  RF request for verapamil LOV: 12/05/16 Next ov:  12/12/16 Last written: 09/07/16 #90 w/ 0RF

## 2016-12-15 ENCOUNTER — Telehealth: Payer: Self-pay | Admitting: *Deleted

## 2016-12-15 MED ORDER — FLUCONAZOLE 150 MG PO TABS: ORAL_TABLET | ORAL | 1 refills | Status: DC

## 2016-12-15 NOTE — Telephone Encounter (Signed)
Pt called asking if Dr. Anitra Lauth will send in something (diflucan pts request) for yeast infection to [facility]. She stated that she has white spots all in her mouth.  She stated that her ear is still hurting her. I advised her to finish out antibiotic and if not improved then return for evaluation. She stated that she also [facility] but [facility]'t [facility], can we put a pack up front for pt to pick up? Please advise. Thanks.

## 2016-12-15 NOTE — Telephone Encounter (Signed)
Pt advised and voiced understanding. Ear wicks put up front for p/u.

## 2016-12-15 NOTE — Telephone Encounter (Signed)
Diflucan eRx'd. Yes, may put a pack of ear wicks up front for her to pick up.

## 2016-12-17 DIAGNOSIS — W540XXA Bitten by dog, initial encounter: Secondary | ICD-10-CM | POA: Diagnosis not present

## 2016-12-17 DIAGNOSIS — S41151A Open bite of right upper arm, initial encounter: Secondary | ICD-10-CM | POA: Diagnosis not present

## 2016-12-17 DIAGNOSIS — H9201 Otalgia, right ear: Secondary | ICD-10-CM | POA: Diagnosis not present

## 2016-12-19 ENCOUNTER — Ambulatory Visit (INDEPENDENT_AMBULATORY_CARE_PROVIDER_SITE_OTHER): Payer: Medicare Other | Admitting: Otolaryngology

## 2016-12-19 ENCOUNTER — Telehealth: Payer: Self-pay | Admitting: *Deleted

## 2016-12-19 DIAGNOSIS — H9201 Otalgia, right ear: Secondary | ICD-10-CM

## 2016-12-19 DIAGNOSIS — H6121 Impacted cerumen, right ear: Secondary | ICD-10-CM

## 2016-12-19 DIAGNOSIS — H60333 Swimmer's ear, bilateral: Secondary | ICD-10-CM | POA: Diagnosis not present

## 2016-12-19 DIAGNOSIS — H60391 Other infective otitis externa, right ear: Secondary | ICD-10-CM

## 2016-12-19 NOTE — Telephone Encounter (Signed)
Pt called stating that her ear is still hurting her. She stated that she has three more days of the oral antibiotics and she will finish the ear drops today or tomorrow. She is requesting a referral to ENT (Dr. Robbie Louis?). Please advise. Thanks.

## 2016-12-19 NOTE — Telephone Encounter (Signed)
Referral ordered as per pt request. 

## 2016-12-20 NOTE — Telephone Encounter (Signed)
SW pt she seen Dr. Benjamine Mola yesterday.

## 2016-12-26 ENCOUNTER — Ambulatory Visit (INDEPENDENT_AMBULATORY_CARE_PROVIDER_SITE_OTHER): Payer: Medicare Other | Admitting: Otolaryngology

## 2016-12-26 DIAGNOSIS — H60331 Swimmer's ear, right ear: Secondary | ICD-10-CM | POA: Diagnosis not present

## 2016-12-27 ENCOUNTER — Other Ambulatory Visit: Payer: Self-pay | Admitting: Family Medicine

## 2017-01-02 ENCOUNTER — Ambulatory Visit (INDEPENDENT_AMBULATORY_CARE_PROVIDER_SITE_OTHER): Payer: Medicare Other | Admitting: Otolaryngology

## 2017-01-02 DIAGNOSIS — H60331 Swimmer's ear, right ear: Secondary | ICD-10-CM | POA: Diagnosis not present

## 2017-01-09 ENCOUNTER — Ambulatory Visit (INDEPENDENT_AMBULATORY_CARE_PROVIDER_SITE_OTHER): Payer: Medicare Other | Admitting: Otolaryngology

## 2017-01-09 DIAGNOSIS — H9071 Mixed conductive and sensorineural hearing loss, unilateral, right ear, with unrestricted hearing on the contralateral side: Secondary | ICD-10-CM

## 2017-01-09 DIAGNOSIS — H7201 Central perforation of tympanic membrane, right ear: Secondary | ICD-10-CM | POA: Diagnosis not present

## 2017-01-09 DIAGNOSIS — H66011 Acute suppurative otitis media with spontaneous rupture of ear drum, right ear: Secondary | ICD-10-CM | POA: Diagnosis not present

## 2017-01-19 ENCOUNTER — Ambulatory Visit: Payer: Medicare Other | Admitting: Family Medicine

## 2017-01-27 ENCOUNTER — Encounter: Payer: Self-pay | Admitting: Family Medicine

## 2017-01-27 ENCOUNTER — Ambulatory Visit (INDEPENDENT_AMBULATORY_CARE_PROVIDER_SITE_OTHER): Payer: Medicare Other | Admitting: Family Medicine

## 2017-01-27 VITALS — BP 150/80 | HR 76 | Temp 97.8°F | Resp 16 | Wt 128.0 lb

## 2017-01-27 DIAGNOSIS — Z1159 Encounter for screening for other viral diseases: Secondary | ICD-10-CM

## 2017-01-27 DIAGNOSIS — H7291 Unspecified perforation of tympanic membrane, right ear: Secondary | ICD-10-CM

## 2017-01-27 DIAGNOSIS — E7800 Pure hypercholesterolemia, unspecified: Secondary | ICD-10-CM

## 2017-01-27 DIAGNOSIS — F4323 Adjustment disorder with mixed anxiety and depressed mood: Secondary | ICD-10-CM

## 2017-01-27 DIAGNOSIS — R6889 Other general symptoms and signs: Secondary | ICD-10-CM | POA: Diagnosis not present

## 2017-01-27 DIAGNOSIS — I10 Essential (primary) hypertension: Secondary | ICD-10-CM

## 2017-01-27 LAB — LIPID PANEL
Cholesterol: 190 mg/dL (ref 0–200)
HDL: 71 mg/dL (ref >39.00)
LDL Cholesterol: 107 mg/dL — ABNORMAL HIGH (ref 0–99)
NonHDL: 118.55
Total CHOL/HDL Ratio: 3
Triglycerides: 57 mg/dL (ref 0.0–149.0)
VLDL: 11.4 mg/dL (ref 0.0–40.0)

## 2017-01-27 LAB — BASIC METABOLIC PANEL
BUN: 11 mg/dL (ref 6–23)
CO2: 29 mEq/L (ref 19–32)
Calcium: 9.8 mg/dL (ref 8.4–10.5)
Chloride: 102 mEq/L (ref 96–112)
Creatinine, Ser: 0.88 mg/dL (ref 0.40–1.20)
GFR: 67.07 mL/min (ref >60.00)
Glucose, Bld: 111 mg/dL — ABNORMAL HIGH (ref 70–99)
Potassium: 4.4 mEq/L (ref 3.5–5.1)
Sodium: 138 mEq/L (ref 135–145)

## 2017-01-27 LAB — TSH: TSH: 1.71 u[IU]/mL (ref 0.35–4.50)

## 2017-01-27 NOTE — Progress Notes (Signed)
OFFICE VISIT  01/27/2017   CC:  Chief Complaint  Patient presents with  . Otalgia    Right ear problems, Labs   HPI:    Patient is a 71 y.o. Caucasian female who presents accompanied by her sister for right ear problem. Pt recounts her recent R ear problems that seemed to start with a cerumen impaction.  Ear was irrigated here but feels like it was not sufficiently done/completed.  She had otitis externa dx'd at [facility] shortly after. Rx'd ear drops rx'd and then augmentin rx'd by myself when I saw her in office shortly after and ear was not improved, eventually referred to ENT, Dr. Benjamine Mola on 12/22/16. She has had to gone see him 4 times now.  Pt states she has "only 5% of R eardrum" remains. Has decreased hearing in R ear, may have to get hearing aid. She has plans to go back to Dr. Benjamine Mola for another [facility] cleaning soon.  She is having excessive anxiety/depression regarding this string of problems.  Says she can't sleep or eat b/c of this.    Past Medical History:  Diagnosis Date  . Allergic rhinitis    maple pollen  . Anxiety   . Arthritis   . Bowel obstruction ([place])   . Chronic low back pain    Dr. Trenton Gammon.  Has had back injection--bp elevated after.  . DDD (degenerative disc disease), cervical    Hx of ACDF (Dr. [name]).  Followed by Dr. [name].  Also, Dr. [name] do left C7-T1 intralaminal epidural injection.  . Diverticulosis of colon   . DJD (degenerative joint disease)   . Gallstones   . GERD (gastroesophageal reflux disease)   . Herpes zoster 07/08/2014  . Hypercholesteremia    mild; pt declined statin trial 05/2014--needs recheck lipid panel at first f/u visit in 2017  . Hypertension   . Osteopenia 07/2015   T score of -1.4 FRAX15%/2%  . Ovarian cyst 2017   GYN following:  f/u imaging 02/2016 showed stability.  Her GYN referred her to [place] for 2nd opinion 03/02/16.  Gyn onc felt surgery likely not necessary but gave pt option of elective  resection---pt declined.  At Dr. Denman George 07/2016 f/u the cyst seemed to be growing by exam; plan is to redo u/s imaging and recheck CA-125, pt to reconsider the option of surgical resection.  . Perianal dermatitis    prn cutivate  . Phlebitis   . Right knee injury 2018   Patellofemoral crush injury--Dr. [name].  . Trochanteric bursitis of right hip    Recurrent (injection by Dr. [name] 05/26/15)  . Varicose vein    left leg     Past Surgical History:  Procedure Laterality Date  . ABDOMINAL HYSTERECTOMY     TAH  . ANTERIOR AND POSTERIOR REPAIR N/A 03/18/2014   Procedure: Cystocele repair with graft, Vault suspension, Rectocele repair;  Surgeon: [name], MD;  Location: [facility];  Service: Urology;  Laterality: N/A;  . CHOLECYSTECTOMY    . CHOLECYSTECTOMY, LAPAROSCOPIC    . COLON SURGERY    . COLONOSCOPY  04/2006; 05/26/16   2007 (Dr. [name]): Normal.  04/2016 (Dr. [name]) normal except diverticulosis and decreased anal sphincter tone.  No repeat colonoscopy is recommended due to age.  . cspine surgery     Dr. Wiliam Ke level ant cerv discectomy /fusion w/plating  . CYSTO N/A 03/18/2014   Procedure: CYSTO;  Surgeon: [name], MD;  Location: [facility];  Service: Urology;  Laterality:  N/A;  . DEXA  07/30/2015   Osteopenia--repeat 2 yrs (Dr. Reino Kent)  . RECTOCELE REPAIR     with prolasped bledder repair  . RESECTION OF COLON     BENIGN TUMOR  . right hemicolectomy for diverticulitis with abscess  1993  . TUBAL LIGATION      Outpatient Medications Prior to Visit  Medication Sig Dispense Refill  . ALPRAZolam (XANAX) 0.5 MG tablet Take 1 tablet (0.5 mg total) by mouth 3 (three) times daily as needed for anxiety. (Patient taking differently: Take 0.5 mg by mouth 3 (three) times daily as needed. ) 90 tablet 5  . aspirin 81 MG tablet Take 81 mg by mouth daily.    . Biotin 10000 MCG TABS Take 1 tablet by mouth daily.    . calcium carbonate (OSCAL) 1500 (600 Ca) MG TABS  tablet Take 600 mg of elemental calcium by mouth daily with breakfast.    . estradiol (ESTRACE VAGINAL) 0.1 MG/GM vaginal cream Apply 1/4 applicator 3 times weekly vaginally. (Patient taking differently: Apply 1/4 applicator 3 times weekly vaginally. As needed) 42.5 g 2  . fexofenadine (ALLEGRA) 60 MG tablet Take 1 tablet (60 mg total) by mouth 2 (two) times daily. 60 tablet 6  . fluticasone (CUTIVATE) 0.05 % cream Apply to affected area bid prn 30 g 1  . fluticasone (FLONASE) 50 MCG/ACT nasal spray Place 2 sprays into both nostrils at bedtime. 48 g 3  . HYDROcodone-acetaminophen (NORCO/VICODIN) 5-325 MG tablet 1 tab po tid prn pain 90 tablet 0  . lisinopril (PRINIVIL,ZESTRIL) 10 MG tablet TAKE 1/2 TABLET (5 MG TOTAL) BY MOUTH AT BEDTIME. IF B/P GREATER THAN 140/90 PATIENT TAKES 1 TABLET 90 tablet 1  . meloxicam (MOBIC) 15 MG tablet TAKE 1 TABLET EVERY DAY WITH FOOD AS NEEDED FOR MUSCULOSKELETAL PAIN 90 tablet 1  . Multiple Vitamin (MULTIVITAMIN) capsule Take 1 capsule by mouth daily.    Marland Kitchen POTASSIUM PO Take by mouth.    . verapamil (CALAN-SR) 180 MG CR tablet TAKE 1 TABLET AT BEDTIME NEEDS TO BE SEEN 90 tablet 1  . vitamin B-12 (CYANOCOBALAMIN) 1000 MCG tablet Take 2,000 mcg by mouth daily.    . Vitamin D, Cholecalciferol, 1000 units CAPS Take 5,000 Units by mouth daily.    Marland Kitchen amoxicillin-clavulanate (AUGMENTIN) 875-125 MG tablet Take 1 tablet by mouth 2 (two) times daily. (Patient not taking: Reported on 01/27/2017) 20 tablet 0  . CIPRODEX OTIC suspension Place 4 drops into the right ear 2 (two) times daily.  0  . fluconazole (DIFLUCAN) 150 MG tablet 1 tab po qd x 2 days (Patient not taking: Reported on 01/27/2017) 2 tablet 1   Facility-Administered Medications Prior to Visit  Medication Dose Route Frequency Provider Last Rate Last Dose  . 0.9 %  sodium chloride infusion  500 mL Intravenous Continuous Gatha Mayer, MD        Allergies  Allergen Reactions  . Gabapentin Other (See Comments)     Elevated heart rate and crazy dreams  . Prednisone     REACTION: funny feeling    ROS As per HPI  PE: Blood pressure (!) 150/80, pulse 76, temperature 97.8 F (36.6 C), temperature source Oral, resp. rate 16, weight 128 lb (58.1 kg), last menstrual period 06/27/1972, SpO2 96 %. Gen: Alert, well appearing.  Patient is oriented to person, place, time, and situation. AFFECT: sad/crying, but lucid thought and speech. Right EAR: EAC and external ear normal. There is essentially no TM remaining.  No  drainage or significant erythema present.  LABS:  none  IMPRESSION AND PLAN:  1) Ruptured R TM: she simply wanted to vent/let me know her feelings and the emotional problems that this problem has caused her--" so it doesn't happen to anyone else".  I expressed empathy, tried to console pt, offered counseling or med tx for her anxiety/depression sx's but she declined. She will continue f/u with Dr. Benjamine Mola regarding this problem.  She is not currently on any meds for this problem at this time.  2) Lab work today: for f/u HTN, HLD, and screening for Hep C. These were ordered after her last f/u office visit as future orders b/c she was not fasting at the time.  Additionally, she asked that I check her TSH today b/c of significant cold intolerance.  An After Visit Summary was printed and given to the patient.  FOLLOW UP: Return if symptoms worsen or fail to improve.  Signed:  Crissie Sickles, MD           01/27/2017

## 2017-01-28 LAB — HEPATITIS C ANTIBODY: HCV Ab: NONREACTIVE

## 2017-01-30 ENCOUNTER — Other Ambulatory Visit (INDEPENDENT_AMBULATORY_CARE_PROVIDER_SITE_OTHER): Payer: Medicare Other

## 2017-01-30 DIAGNOSIS — R7301 Impaired fasting glucose: Secondary | ICD-10-CM

## 2017-01-30 LAB — HEMOGLOBIN A1C: Hgb A1c MFr Bld: 4.9 % (ref 4.6–6.5)

## 2017-01-31 ENCOUNTER — Encounter: Payer: Self-pay | Admitting: *Deleted

## 2017-02-13 ENCOUNTER — Other Ambulatory Visit: Payer: Self-pay | Admitting: Gynecology

## 2017-02-13 DIAGNOSIS — Z1231 Encounter for screening mammogram for malignant neoplasm of breast: Secondary | ICD-10-CM

## 2017-02-16 ENCOUNTER — Other Ambulatory Visit: Payer: Self-pay | Admitting: *Deleted

## 2017-02-16 ENCOUNTER — Ambulatory Visit (INDEPENDENT_AMBULATORY_CARE_PROVIDER_SITE_OTHER): Payer: Medicare Other | Admitting: Otolaryngology

## 2017-02-16 DIAGNOSIS — H628X1 Other disorders of right external ear in diseases classified elsewhere: Secondary | ICD-10-CM

## 2017-02-16 MED ORDER — ALPRAZOLAM 0.5 MG PO TABS: 0.5000 mg | ORAL_TABLET | Freq: Three times a day (TID) | ORAL | 5 refills | Status: DC | PRN

## 2017-02-16 NOTE — Telephone Encounter (Signed)
[facility]  RF request for alprazolam LOV: 01/27/17 Next ov: 06/26/17 Last written: 08/03/16 #90 w/ 5RF  Please advise. Thanks.

## 2017-02-17 NOTE — Telephone Encounter (Signed)
Rx faxed

## 2017-02-23 ENCOUNTER — Ambulatory Visit (INDEPENDENT_AMBULATORY_CARE_PROVIDER_SITE_OTHER): Payer: Medicare Other | Admitting: Otolaryngology

## 2017-02-23 DIAGNOSIS — H60331 Swimmer's ear, right ear: Secondary | ICD-10-CM | POA: Diagnosis not present

## 2017-03-02 ENCOUNTER — Ambulatory Visit (INDEPENDENT_AMBULATORY_CARE_PROVIDER_SITE_OTHER): Payer: Medicare Other | Admitting: Otolaryngology

## 2017-03-02 DIAGNOSIS — H66011 Acute suppurative otitis media with spontaneous rupture of ear drum, right ear: Secondary | ICD-10-CM | POA: Diagnosis not present

## 2017-03-16 ENCOUNTER — Ambulatory Visit (INDEPENDENT_AMBULATORY_CARE_PROVIDER_SITE_OTHER): Payer: Medicare Other | Admitting: Otolaryngology

## 2017-03-16 DIAGNOSIS — H66011 Acute suppurative otitis media with spontaneous rupture of ear drum, right ear: Secondary | ICD-10-CM | POA: Diagnosis not present

## 2017-03-26 ENCOUNTER — Other Ambulatory Visit: Payer: Self-pay | Admitting: Family Medicine

## 2017-03-27 NOTE — Telephone Encounter (Signed)
[facility]  RF request for Meloxicam LOV: 01/27/17 Next ov: None Last written: 10/04/16 #90 w/ 1RF  Please advise. Thanks.

## 2017-03-30 DIAGNOSIS — H9011 Conductive hearing loss, unilateral, right ear, with unrestricted hearing on the contralateral side: Secondary | ICD-10-CM | POA: Diagnosis not present

## 2017-03-30 DIAGNOSIS — I10 Essential (primary) hypertension: Secondary | ICD-10-CM | POA: Diagnosis not present

## 2017-03-30 DIAGNOSIS — Z79899 Other long term (current) drug therapy: Secondary | ICD-10-CM | POA: Diagnosis not present

## 2017-03-30 DIAGNOSIS — Z7982 Long term (current) use of aspirin: Secondary | ICD-10-CM | POA: Diagnosis not present

## 2017-03-30 DIAGNOSIS — H7291 Unspecified perforation of tympanic membrane, right ear: Secondary | ICD-10-CM | POA: Diagnosis not present

## 2017-03-30 DIAGNOSIS — Z888 Allergy status to other drugs, medicaments and biological substances status: Secondary | ICD-10-CM | POA: Diagnosis not present

## 2017-04-11 ENCOUNTER — Encounter: Payer: Self-pay | Admitting: Family Medicine

## 2017-04-14 DIAGNOSIS — H7291 Unspecified perforation of tympanic membrane, right ear: Secondary | ICD-10-CM | POA: Diagnosis not present

## 2017-04-14 DIAGNOSIS — H9011 Conductive hearing loss, unilateral, right ear, with unrestricted hearing on the contralateral side: Secondary | ICD-10-CM | POA: Diagnosis not present

## 2017-04-26 ENCOUNTER — Telehealth: Payer: Self-pay | Admitting: *Deleted

## 2017-04-26 NOTE — Telephone Encounter (Signed)
Pt called c/o pelvic cramping has been going on for "some time now" per patient, states pelvic cramping is becoming more intense, comes and goes, also notes vaginal burning, no itching, no discharge,no urinary symptoms per patient.  Pt states she has medication to take for cramping, but appears to becoming worse since last visit in 12/17. I suggested office visit with Dr.Fontine, front desk has patient scheduled for tomorrow at 10:30am.

## 2017-04-27 ENCOUNTER — Ambulatory Visit (INDEPENDENT_AMBULATORY_CARE_PROVIDER_SITE_OTHER): Payer: Medicare Other | Admitting: Gynecology

## 2017-04-27 ENCOUNTER — Encounter: Payer: Self-pay | Admitting: Gynecology

## 2017-04-27 VITALS — BP 118/78

## 2017-04-27 DIAGNOSIS — N390 Urinary tract infection, site not specified: Secondary | ICD-10-CM | POA: Diagnosis not present

## 2017-04-27 DIAGNOSIS — R1030 Lower abdominal pain, unspecified: Secondary | ICD-10-CM

## 2017-04-27 DIAGNOSIS — N83209 Unspecified ovarian cyst, unspecified side: Secondary | ICD-10-CM | POA: Diagnosis not present

## 2017-04-27 MED ORDER — NITROFURANTOIN MONOHYD MACRO 100 MG PO CAPS: 100.0000 mg | ORAL_CAPSULE | Freq: Two times a day (BID) | ORAL | 0 refills | Status: DC

## 2017-04-27 NOTE — Addendum Note (Signed)
Addended by: Nelva Nay on: 04/27/2017 01:03 PM   Modules accepted: Orders

## 2017-04-27 NOTE — Patient Instructions (Signed)
Take the prescribed antibiotic twice daily for 7 days.  Schedule an appointment with Dr. Denman George in reference to surgery.  Call my office if you have any problems arranging this.

## 2017-04-27 NOTE — Progress Notes (Signed)
    Dayshia Ballinas 12/10/1945 [id number]        71 y.o.  [id number] presents complaining of lower abdominal discomfort and vaginal burning on the outside.  Has been going on for months.  No nausea vomiting diarrhea constipation.  No urinary symptoms such as frequency dysuria urgency fever or chills.  History of large pelvic cyst being followed expectantly with last visit with Dr. Denman George ultrasound measuring 9.1 x 7.9 x 8.2 cm.  CA 125 8.4.  Past medical history,surgical history, problem list, medications, allergies, family history and social history were all reviewed and documented in the EPIC chart.  Directed ROS with pertinent positives and negatives documented in the history of present illness/assessment and plan.  Exam: Caryn Bee assistant Vitals:   04/27/17 1022  BP: 118/78   General appearance:  Normal Abdomen soft nontender without masses guarding rebound Pelvic external BUS vagina with atrophic changes.  Mild cystocele noted.  Cuff well supported.  Bimanual exam with a 10 cm midline mass mildly tender to palpation.  Assessment/Plan:  70 y.o. [id number] with:  1. Lower abdominal discomfort, cystic pelvic mass.  Relatively stable on serial ultrasounds with negative Ca125's.  Urine analysis today does show moderate bacteriuria with 20-40 WBCs.  6-10 squamous cells noted.  Will cover for UTI with Macrobid 100 mg twice daily times 7 days.  Not sure that this is the etiology of her pain as I suspect it is more due to the pelvic cyst but will cover her for this.  I also recommended that she follow-up with Dr. Denman George to rediscuss proceeding with surgery as they have had this discussion in the past and she appears to be more symptomatic now.  Patient agrees to call and make that appointment.  She will let me know if she has any issues arranging this appointment. 2. Vaginal burning.  I reviewed how much is related to atrophic changes.  There is no evidence of infection on exam.  Will monitor for now with  antibiotic treatment as above and she will follow-up if this symptom persists    [name] MD, 10:53 AM 04/27/2017

## 2017-04-28 DIAGNOSIS — S0921XD Traumatic rupture of right ear drum, subsequent encounter: Secondary | ICD-10-CM | POA: Diagnosis not present

## 2017-04-29 LAB — URINE CULTURE
MICRO NUMBER:: 81232448
Result:: NO GROWTH
SPECIMEN QUALITY:: ADEQUATE

## 2017-04-29 LAB — URINALYSIS W MICROSCOPIC + REFLEX CULTURE
Bilirubin Urine: NEGATIVE
Glucose, UA: NEGATIVE
Hgb urine dipstick: NEGATIVE
Hyaline Cast: NONE SEEN /LPF
Ketones, ur: NEGATIVE
Nitrites, Initial: NEGATIVE
Protein, ur: NEGATIVE
RBC / HPF: NONE SEEN /HPF (ref 0–2)
Specific Gravity, Urine: 1.01 (ref 1.001–1.03)
pH: 6 (ref 5.0–8.0)

## 2017-04-29 LAB — CULTURE INDICATED

## 2017-05-04 ENCOUNTER — Telehealth: Payer: Self-pay | Admitting: *Deleted

## 2017-05-04 MED ORDER — IBUPROFEN 800 MG PO TABS: 800.0000 mg | ORAL_TABLET | Freq: Three times a day (TID) | ORAL | 1 refills | Status: DC | PRN

## 2017-05-04 NOTE — Telephone Encounter (Signed)
At this point I would go with a pain reliever.  If she can tolerate with ibuprofen then ibuprofen 800 mg every 8 hours as needed for pain.  #60 with 1 refill

## 2017-05-04 NOTE — Telephone Encounter (Signed)
Pt wanted to let you know she is scheduled with Dr.Rossi on 05/31/17, last day taking Macrobid 100 mg tablet, pt asked if you could help the lower pelvic discomfort states burning/cramping pain comes and goes. No urinary symptoms, same pelvic discomfort as before. Please advise

## 2017-05-04 NOTE — Telephone Encounter (Signed)
Pt aware, Rx sent. 

## 2017-05-11 DIAGNOSIS — J323 Chronic sphenoidal sinusitis: Secondary | ICD-10-CM | POA: Diagnosis not present

## 2017-05-11 DIAGNOSIS — H7191 Unspecified cholesteatoma, right ear: Secondary | ICD-10-CM | POA: Diagnosis not present

## 2017-05-11 DIAGNOSIS — H7202 Central perforation of tympanic membrane, left ear: Secondary | ICD-10-CM | POA: Diagnosis not present

## 2017-05-11 DIAGNOSIS — H6123 Impacted cerumen, bilateral: Secondary | ICD-10-CM | POA: Diagnosis not present

## 2017-05-11 DIAGNOSIS — H7291 Unspecified perforation of tympanic membrane, right ear: Secondary | ICD-10-CM | POA: Diagnosis not present

## 2017-05-12 ENCOUNTER — Telehealth: Payer: Self-pay | Admitting: *Deleted

## 2017-05-12 NOTE — Telephone Encounter (Signed)
Patient called and rescheduled her appt to November 21st at 2:30pm

## 2017-05-16 ENCOUNTER — Encounter: Payer: Self-pay | Admitting: Family Medicine

## 2017-05-17 ENCOUNTER — Encounter: Payer: Self-pay | Admitting: Gynecologic Oncology

## 2017-05-17 ENCOUNTER — Ambulatory Visit: Payer: Medicare Other | Attending: Gynecologic Oncology | Admitting: Gynecologic Oncology

## 2017-05-17 VITALS — BP 136/52 | HR 68 | Temp 98.2°F | Resp 16 | Ht 61.0 in | Wt 127.0 lb

## 2017-05-17 DIAGNOSIS — Z803 Family history of malignant neoplasm of breast: Secondary | ICD-10-CM | POA: Insufficient documentation

## 2017-05-17 DIAGNOSIS — Z8042 Family history of malignant neoplasm of prostate: Secondary | ICD-10-CM | POA: Insufficient documentation

## 2017-05-17 DIAGNOSIS — R1030 Lower abdominal pain, unspecified: Secondary | ICD-10-CM

## 2017-05-17 DIAGNOSIS — M5030 Other cervical disc degeneration, unspecified cervical region: Secondary | ICD-10-CM | POA: Diagnosis not present

## 2017-05-17 DIAGNOSIS — I10 Essential (primary) hypertension: Secondary | ICD-10-CM | POA: Insufficient documentation

## 2017-05-17 DIAGNOSIS — Z888 Allergy status to other drugs, medicaments and biological substances status: Secondary | ICD-10-CM | POA: Insufficient documentation

## 2017-05-17 DIAGNOSIS — R102 Pelvic and perineal pain: Secondary | ICD-10-CM | POA: Diagnosis not present

## 2017-05-17 DIAGNOSIS — K219 Gastro-esophageal reflux disease without esophagitis: Secondary | ICD-10-CM | POA: Insufficient documentation

## 2017-05-17 DIAGNOSIS — Z79899 Other long term (current) drug therapy: Secondary | ICD-10-CM | POA: Diagnosis not present

## 2017-05-17 DIAGNOSIS — Z7982 Long term (current) use of aspirin: Secondary | ICD-10-CM | POA: Diagnosis not present

## 2017-05-17 DIAGNOSIS — M549 Dorsalgia, unspecified: Secondary | ICD-10-CM | POA: Diagnosis not present

## 2017-05-17 DIAGNOSIS — Z79891 Long term (current) use of opiate analgesic: Secondary | ICD-10-CM | POA: Insufficient documentation

## 2017-05-17 DIAGNOSIS — Z8249 Family history of ischemic heart disease and other diseases of the circulatory system: Secondary | ICD-10-CM | POA: Insufficient documentation

## 2017-05-17 DIAGNOSIS — Z800 Family history of malignant neoplasm of digestive organs: Secondary | ICD-10-CM | POA: Insufficient documentation

## 2017-05-17 DIAGNOSIS — N83201 Unspecified ovarian cyst, right side: Secondary | ICD-10-CM

## 2017-05-17 DIAGNOSIS — Z9889 Other specified postprocedural states: Secondary | ICD-10-CM | POA: Diagnosis not present

## 2017-05-17 DIAGNOSIS — Z9049 Acquired absence of other specified parts of digestive tract: Secondary | ICD-10-CM | POA: Diagnosis not present

## 2017-05-17 DIAGNOSIS — F419 Anxiety disorder, unspecified: Secondary | ICD-10-CM | POA: Insufficient documentation

## 2017-05-17 DIAGNOSIS — G8929 Other chronic pain: Secondary | ICD-10-CM | POA: Insufficient documentation

## 2017-05-17 DIAGNOSIS — N83209 Unspecified ovarian cyst, unspecified side: Secondary | ICD-10-CM | POA: Insufficient documentation

## 2017-05-17 DIAGNOSIS — E7800 Pure hypercholesterolemia, unspecified: Secondary | ICD-10-CM | POA: Insufficient documentation

## 2017-05-17 DIAGNOSIS — Z8489 Family history of other specified conditions: Secondary | ICD-10-CM | POA: Diagnosis not present

## 2017-05-17 DIAGNOSIS — K56609 Unspecified intestinal obstruction, unspecified as to partial versus complete obstruction: Secondary | ICD-10-CM | POA: Insufficient documentation

## 2017-05-17 DIAGNOSIS — Z791 Long term (current) use of non-steroidal anti-inflammatories (NSAID): Secondary | ICD-10-CM | POA: Insufficient documentation

## 2017-05-17 DIAGNOSIS — Z808 Family history of malignant neoplasm of other organs or systems: Secondary | ICD-10-CM | POA: Insufficient documentation

## 2017-05-17 NOTE — Progress Notes (Signed)
Consult Note: Gyn-Onc  Consult was requested by Dr. Phineas Real for the evaluation of Alison Cuevas  CC:  Chief Complaint  Patient presents with  . Ovarian mass    Assessment/Plan:  Alison Cuevas  is a 71 y.o.  year Cuevas with a moderately large (8cm) right ovarian mostly simple benign appearing cyst associated with a normal CA 125. It now appears to be symptomatic. She has a dualing issue of right ear problems requiring surgical reconstruction. She is reticent to undergo abdominal surgery until her ear is fixed. This won't be until March, 2019.  I suggested repeating US now to evaluate the cyst for dimensions and characteristics.   I discussed that I do not believe that it is malignant but should be removed due to its symptoms.  She will notify me if she would like to proceed with surgery prior to her ear surgery (robotic assisted BSO, possible staging).  Otherwise, I will see her in February, 2019 to plan a definitive surgery after her ear procedure.   HPI: Alison Cuevas is a very pleasant 71 year Cuevas parous woman who is seen in consultation at the request of Dr. Phineas Real for a large right ovarian cysts.  The patient has a long-standing history of bladder prolapse and recurrent UTIs for which she was being treated by Dr.MacDiarmid at [facility] urology. Due to the persistent nature of her symptoms she underwent a CT scan of the abdomen and pelvis to workup this issue further that was performed on 10/29/2015. It revealed an incidental finding of a 7.9 cm right pelvic cystic structure.  Follow-up the evaluation was performed with a repeat ultrasound on 03/02/2016. This revealed the cyst to measure a 0.5 cm in largest dimension with a second smaller cyst within it measuring 13 x 12 mm. Left ovary was normal. The uterus was surgically absent. A CA-125 was drawn and was normal at 6.  The patient reports intermittent sharp right flank and lower abdominal pains. Is unclear what  these are associated with, she thinks maybe with straining or lifting she notices some more. She is chronic back pain issues. She's also had a significant surgical history for a laparotomy with hemicolectomy, laparoscopic cholecystectomy, and total abdominal hysterectomy in her 20s for fibroid uterus and pelvic pain.  She is no concerning family history that Child psychotherapist sent out is having a likely genetic predisposition to ovarian or breast cancer.  She is next on performance status she takes no regular anticoagulant medications and has no other major medical issues.    Interval Hx:  The patient elected for expectant management as she was worried about the convalescence associated with surgical recovery. In January, 2018 she began experiencing intermittent severe abdominal pains. Repeat US in March, 2018 showed a stable 9cm ovarian cyst with homogeneous low level internal echoes and stable small eccentric thin walled 1.1cm daughter cyst. No vascularity or mural nodules.  CA 125 on 08/22/16 was normal and stable at 8.4.  The patient elected for expectant management because she did not want to manage a surgical convalescence while managing her husband's health issues.  In the meantime she developed a severe right ear problem which included erosion of the ear drum and middle ear. This is being managed by an ENT with a plan for reconstruction in March, 2019. This is causing her substantial pain, and anxiety.  She began developing increasing lower abdominal pains in October, 2018. She saw Dr Phineas Real who was concerned that now the cyst was symptomatic  she might be interested in its removal.  Current Meds:  Outpatient Encounter Medications as of 05/17/2017  Medication Sig  . ALPRAZolam (XANAX) 0.5 MG tablet Take 1 tablet (0.5 mg total) by mouth 3 (three) times daily as needed for anxiety.  Marland Kitchen aspirin 81 MG tablet Take 81 mg by mouth daily.  . Biotin 10000 MCG TABS Take 1 tablet by mouth daily.  . calcium  carbonate (OSCAL) 1500 (600 Ca) MG TABS tablet Take 600 mg of elemental calcium by mouth daily with breakfast.  . CIPRODEX OTIC suspension Place 4 drops into the right ear 2 (two) times daily.  Marland Kitchen estradiol (ESTRACE VAGINAL) 0.1 MG/GM vaginal cream Apply 1/4 applicator 3 times weekly vaginally. (Patient taking differently: Apply 1/4 applicator 3 times weekly vaginally. As needed)  . fexofenadine (ALLEGRA) 60 MG tablet Take 1 tablet (60 mg total) by mouth 2 (two) times daily.  . fluticasone (CUTIVATE) 0.05 % cream Apply to affected area bid prn  . fluticasone (FLONASE) 50 MCG/ACT nasal spray Place 2 sprays into both nostrils at bedtime.  Marland Kitchen HYDROcodone-acetaminophen (NORCO/VICODIN) 5-325 MG tablet 1 tab po tid prn pain  . ibuprofen (ADVIL,MOTRIN) 800 MG tablet Take 1 tablet (800 mg total) every 8 (eight) hours as needed by mouth.  Marland Kitchen lisinopril (PRINIVIL,ZESTRIL) 10 MG tablet TAKE 1/2 TABLET (5 MG TOTAL) BY MOUTH AT BEDTIME. IF B/P GREATER THAN 140/90 PATIENT TAKES 1 TABLET  . meloxicam (MOBIC) 15 MG tablet TAKE 1 TABLET EVERY DAY WITH FOOD AS NEEDED FOR MUSCULOSKELETAL PAIN  . Multiple Vitamin (MULTIVITAMIN) capsule Take 1 capsule by mouth daily.  Marland Kitchen POTASSIUM PO Take by mouth.  . verapamil (CALAN-SR) 180 MG CR tablet TAKE 1 TABLET AT BEDTIME NEEDS TO BE SEEN  . vitamin B-12 (CYANOCOBALAMIN) 1000 MCG tablet Take 2,000 mcg by mouth daily.  . Vitamin D, Cholecalciferol, 1000 units CAPS Take 5,000 Units by mouth daily.  . [DISCONTINUED] nitrofurantoin, macrocrystal-monohydrate, (MACROBID) 100 MG capsule Take 1 capsule (100 mg total) by mouth 2 (two) times daily. For 7 days   Facility-Administered Encounter Medications as of 05/17/2017  Medication  . 0.9 %  sodium chloride infusion    Allergy:  Allergies  Allergen Reactions  . Gabapentin Other (See Comments)    Elevated heart rate and crazy dreams  . Prednisone     REACTION: funny feeling    Social Hx:   Social History   Socioeconomic  History  . Marital status: Married    Spouse name: [name]  . Number of children: 3  . Years of education: Not on file  . Highest education level: Not on file  Social Needs  . Financial resource strain: Not on file  . Food insecurity - worry: Not on file  . Food insecurity - inability: Not on file  . Transportation needs - medical: Not on file  . Transportation needs - non-medical: Not on file  Occupational History  . Occupation: retired  Tobacco Use  . Smoking status: Never Smoker  . Smokeless tobacco: Never Used  Substance and Sexual Activity  . Alcohol use: No    Alcohol/week: 0.0 oz  . Drug use: No  . Sexual activity: No    Birth control/protection: Surgical    Comment: 1st intercourse 20 yo-1 partner  Other Topics Concern  . Not on file  Social History Narrative   Married, has 3 children (one lives near her).  Has 8 grandchildren, 2 greatgrandchildren.   Worked cleaning apartments, worked at [employer], was a Secretary/administrator.  She retired at age 26.   No tobacco.  No alcohol.  No drugs.   Exercise: clean, work in yard.        10 siblings in all--5 have passed away--1 child age 38 with whooping cough , 1 lupus, 1 melanoma,1 lung cancer, 1 pulm fibrosis    Past Surgical Hx:  Past Surgical History:  Procedure Laterality Date  . ABDOMINAL HYSTERECTOMY     TAH  . ANTERIOR AND POSTERIOR REPAIR N/A 03/18/2014   Procedure: Cystocele repair with graft, Vault suspension, Rectocele repair;  Surgeon: [name], MD;  Location: [facility];  Service: Urology;  Laterality: N/A;  . CHOLECYSTECTOMY    . CHOLECYSTECTOMY, LAPAROSCOPIC    . COLON SURGERY    . COLONOSCOPY  04/2006; 05/26/16   2007 (Dr. [name]): Normal.  04/2016 (Dr. [name]) normal except diverticulosis and decreased anal sphincter tone.  No repeat colonoscopy is recommended due to age.  . cspine surgery     Dr. Wiliam Ke level ant cerv discectomy /fusion w/plating  . CYSTO N/A 03/18/2014   Procedure:  CYSTO;  Surgeon: [name], MD;  Location: [facility];  Service: Urology;  Laterality: N/A;  . DEXA  07/30/2015   Osteopenia--repeat 2 yrs (Dr. Reino Kent)  . RECTOCELE REPAIR     with prolasped bledder repair  . RESECTION OF COLON     BENIGN TUMOR  . right hemicolectomy for diverticulitis with abscess  1993  . TUBAL LIGATION      Past Medical Hx:  Past Medical History:  Diagnosis Date  . Allergic rhinitis    maple pollen  . Anxiety   . Arthritis   . Bowel obstruction ([place])   . Chronic low back pain    Dr. Trenton Gammon.  Has had back injection--bp elevated after.  . DDD (degenerative disc disease), cervical    Hx of ACDF (Dr. [name]).  Followed by Dr. [name].  Also, Dr. [name] do left C7-T1 intralaminal epidural injection.  . Diverticulosis of colon   . DJD (degenerative joint disease)   . Gallstones   . GERD (gastroesophageal reflux disease)   . Herpes zoster 07/08/2014  . Hypercholesteremia    mild; pt declined statin trial 05/2014--needs recheck lipid panel at first f/u visit in 2017  . Hypertension   . Osteopenia 07/2015   T score of -1.4 FRAX15%/2%  . Ovarian cyst 2017   GYN following:  f/u imaging 02/2016 showed stability.  Her GYN referred her to [place] for 2nd opinion 03/02/16.  Gyn onc felt surgery likely not necessary but gave pt option of elective resection---pt declined.  At Dr. Denman George 07/2016 f/u the cyst seemed to be growing by exam; plan is to redo u/s imaging and recheck CA-125, pt to reconsider the option of surgical resection (still plan as of 04/2017)  . Perianal dermatitis    prn cutivate  . Phlebitis   . Right knee injury 2018   Patellofemoral crush injury--Dr. [name].  . Trochanteric bursitis of right hip    Recurrent (injection by Dr. [name] 05/26/15)  . 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 Benjamine Mola, will likely get hearing aid.  F/u 03/2017 w/Dr. Yates Decamp sign of infection, option of TM  repair or amplification--pt to think abnout it.  . Varicose vein    left leg     Past Gynecological History:  SVD x 2, hysterectomy in her 20's Patient's last menstrual period was 06/27/1972.  Family Hx:  Family History  Problem  Relation Age of Onset  . Hypertension Mother   . Diverticulitis Mother   . Breast cancer Sister 72  . Melanoma Brother   . Prostate cancer Brother   . Leukemia Brother   . Lupus Sister   . Lung disease Brother   . Stomach cancer Father   . Other Unknown        Family member with MGUS    Review of Systems:  Constitutional  Feels well,    ENT Normal appearing ears and nares bilaterally Skin/Breast  No rash, sores, jaundice, itching, dryness Cardiovascular  No chest pain, shortness of breath, or edema  Pulmonary  No cough or wheeze.  Gastro Intestinal  No nausea, vomitting, or diarrhoea. No bright red blood per rectum, no abdominal pain, change in bowel movement, or constipation.  Genito Urinary  No frequency, urgency, dysuria, occasional lower pelvic right sided pain Musculo Skeletal  No myalgia, arthralgia, joint swelling or pain  Neurologic  No weakness, numbness, change in gait,  Psychology  No depression, anxiety, insomnia.   Vitals:  Blood pressure (!) 136/52, pulse 68, temperature 98.2 F (36.8 C), temperature source Oral, resp. rate 16, height 5\' 1"  (1.549 m), weight 127 lb (57.6 kg), last menstrual period 06/27/1972, SpO2 100 %.  Physical Exam: WD in NAD Neck  Supple NROM, without any enlargements.  Lymph Node Survey No cervical supraclavicular or inguinal adenopathy Cardiovascular  Pulse normal rate, regularity and rhythm. S1 and S2 normal.  Lungs  Clear to auscultation bilateraly, without wheezes/crackles/rhonchi. Good air movement.  Skin  No rash/lesions/breakdown  Psychiatry  Alert and oriented to person, place, and time  Abdomen  Normoactive bowel sounds, abdomen soft, non-tender and thin without evidence of hernia.   Back No CVA tenderness Genito Urinary  Vulva/vagina: Normal external Cuevas genitalia.  No lesions. No discharge or bleeding.  Bladder/urethra:  No lesions or masses, well supported bladder  Vagina: normal  Cervix: surgically absent  Uterus: surgically absent   Adnexa: can appreciate a smooth walled mass on the mid pelvis, approximately 10cm, minimally mobile but not apparently attached to the vaginal cuff or rectum. Rectal  Good tone, no masses no cul de sac nodularity.  Extremities  No bilateral cyanosis, clubbing or edema.   Donaciano Eva, MD  05/17/2017, 2:51 PM

## 2017-05-17 NOTE — Patient Instructions (Signed)
Plan on having an ultrasound and we will contact you with the results.  Please call our office if you decide to have surgery sooner than your follow up appointment and we will make arrangements.  Plan to follow up in February 2019 or sooner if needed.  Please call for any questions or concerns.

## 2017-05-21 ENCOUNTER — Encounter: Payer: Self-pay | Admitting: Family Medicine

## 2017-05-24 DIAGNOSIS — Z888 Allergy status to other drugs, medicaments and biological substances status: Secondary | ICD-10-CM | POA: Diagnosis not present

## 2017-05-24 DIAGNOSIS — S0921XD Traumatic rupture of right ear drum, subsequent encounter: Secondary | ICD-10-CM | POA: Diagnosis not present

## 2017-05-24 DIAGNOSIS — Z79899 Other long term (current) drug therapy: Secondary | ICD-10-CM | POA: Diagnosis not present

## 2017-05-24 DIAGNOSIS — H9211 Otorrhea, right ear: Secondary | ICD-10-CM | POA: Diagnosis not present

## 2017-05-24 DIAGNOSIS — Z7982 Long term (current) use of aspirin: Secondary | ICD-10-CM | POA: Diagnosis not present

## 2017-05-24 DIAGNOSIS — S0921XS Traumatic rupture of right ear drum, sequela: Secondary | ICD-10-CM | POA: Diagnosis not present

## 2017-05-24 DIAGNOSIS — I10 Essential (primary) hypertension: Secondary | ICD-10-CM | POA: Diagnosis not present

## 2017-05-25 ENCOUNTER — Ambulatory Visit (HOSPITAL_COMMUNITY)
Admission: RE | Admit: 2017-05-25 | Discharge: 2017-05-25 | Disposition: A | Discharge status: Home / Self Care | Payer: Medicare Other | Source: Ambulatory Visit | Attending: Gynecologic Oncology | Admitting: Gynecologic Oncology

## 2017-05-25 DIAGNOSIS — N838 Other noninflammatory disorders of ovary, fallopian tube and broad ligament: Secondary | ICD-10-CM | POA: Diagnosis not present

## 2017-05-25 DIAGNOSIS — N83201 Unspecified ovarian cyst, right side: Secondary | ICD-10-CM | POA: Diagnosis not present

## 2017-05-25 DIAGNOSIS — N951 Menopausal and female climacteric states: Secondary | ICD-10-CM | POA: Diagnosis not present

## 2017-05-25 DIAGNOSIS — R102 Pelvic and perineal pain: Secondary | ICD-10-CM | POA: Insufficient documentation

## 2017-05-26 ENCOUNTER — Telehealth: Payer: Self-pay | Admitting: Gynecologic Oncology

## 2017-05-26 NOTE — Telephone Encounter (Signed)
Patient informed of [place] results.  She states she will probably not be able to have her ear surgery until March.  Follow up appt with Dr. Denman George will be on Feb 22.  All questions answered.  Advised to call for any needs.  Also informed of Dr. Serita Grit comments, see below: Minimal growth in cyst. Unlikely to be malignant, but is symptomatic so she should have it removed when the timing works with her ear issues.  Alison Cuevas

## 2017-05-27 ENCOUNTER — Encounter: Payer: Self-pay | Admitting: Family Medicine

## 2017-05-31 ENCOUNTER — Ambulatory Visit: Payer: Medicare Other | Admitting: Gynecologic Oncology

## 2017-06-02 DIAGNOSIS — H9211 Otorrhea, right ear: Secondary | ICD-10-CM | POA: Diagnosis not present

## 2017-06-02 DIAGNOSIS — S0921XD Traumatic rupture of right ear drum, subsequent encounter: Secondary | ICD-10-CM | POA: Diagnosis not present

## 2017-06-08 ENCOUNTER — Other Ambulatory Visit: Payer: Self-pay | Admitting: Family Medicine

## 2017-06-09 DIAGNOSIS — S0921XD Traumatic rupture of right ear drum, subsequent encounter: Secondary | ICD-10-CM | POA: Diagnosis not present

## 2017-06-09 DIAGNOSIS — H9211 Otorrhea, right ear: Secondary | ICD-10-CM | POA: Diagnosis not present

## 2017-06-13 ENCOUNTER — Telehealth: Payer: Self-pay | Admitting: Family Medicine

## 2017-06-13 NOTE — Telephone Encounter (Signed)
Copied from [place]. Topic: Quick Communication - See Telephone Encounter >> Jun 13, 2017 11:21 AM Hewitt Shorts wrote: CRM for notification. See Telephone encounter for: pt is on antibiotics for her ear and since she takes those the pharmacy is stating that she should not take the flu shot so she would like to confirm this with the provider   Best number [phone]  06/13/17.

## 2017-06-13 NOTE — Telephone Encounter (Signed)
Left message  For pt. To complete antibiotic and/or she is over her illness, to take the flu vaccine.

## 2017-06-16 DIAGNOSIS — H9211 Otorrhea, right ear: Secondary | ICD-10-CM | POA: Diagnosis not present

## 2017-06-16 DIAGNOSIS — S0921XD Traumatic rupture of right ear drum, subsequent encounter: Secondary | ICD-10-CM | POA: Diagnosis not present

## 2017-06-16 DIAGNOSIS — S0921XS Traumatic rupture of right ear drum, sequela: Secondary | ICD-10-CM | POA: Diagnosis not present

## 2017-06-21 ENCOUNTER — Ambulatory Visit (INDEPENDENT_AMBULATORY_CARE_PROVIDER_SITE_OTHER): Payer: Medicare Other | Admitting: Gynecology

## 2017-06-21 ENCOUNTER — Encounter: Payer: Self-pay | Admitting: Gynecology

## 2017-06-21 VITALS — BP 128/70 | Ht 60.0 in | Wt 129.0 lb

## 2017-06-21 DIAGNOSIS — Z01411 Encounter for gynecological examination (general) (routine) with abnormal findings: Secondary | ICD-10-CM

## 2017-06-21 DIAGNOSIS — N83209 Unspecified ovarian cyst, unspecified side: Secondary | ICD-10-CM

## 2017-06-21 DIAGNOSIS — N952 Postmenopausal atrophic vaginitis: Secondary | ICD-10-CM

## 2017-06-21 DIAGNOSIS — I83812 Varicose veins of left lower extremities with pain: Secondary | ICD-10-CM

## 2017-06-21 NOTE — Progress Notes (Signed)
    Alison Cuevas 05/23/1946 [id number]        71 y.o.  [phone] for breast and pelvic exam.  Is planning surgery with Dr. Denman George in reference to her right ovarian cyst.  Is complaining of some left leg varicosities that are tender.  Past medical history,surgical history, problem list, medications, allergies, family history and social history were all reviewed and documented as reviewed in the EPIC chart.  ROS:  Performed with pertinent positives and negatives included in the history, assessment and plan.   Additional significant findings : None   Exam: [name] assistant Vitals:   06/21/17 1048  BP: 128/70  Weight: 129 lb (58.5 kg)  Height: 5' (1.524 m)   Body mass index is 25.19 kg/m.  General appearance:  Normal affect, orientation and appearance. Skin: Grossly normal HEENT: Without gross lesions.  No cervical or supraclavicular adenopathy. Thyroid normal.  Lungs:  Clear without wheezing, rales or rhonchi Cardiac: RR, without RMG Abdominal:  Soft, nontender, without masses, guarding, rebound, organomegaly or hernia Breasts:  Examined lying and sitting without masses, retractions, discharge or axillary adenopathy. Pelvic:  Ext, BUS, Vagina: With atrophic changes.  Adnexa: With pelvic fullness consistent with her 8-9 cm pelvic cyst.  Anus and perineum: Normal   Rectovaginal: Normal sphincter tone without palpated masses or tenderness. Extremities: Multiple varicosities left leg from thigh to calf.  No evidence of thrombosis or inflammation.   Assessment/Plan:  71 y.o. [id number] female for breast and pelvic exam.   1. Postmenopausal/atrophic genital changes.  No significant hot flushes, night sweats, vaginal dryness.  Status post hysterectomy in the past. 2. Pelvic cyst.  Becoming symptomatic to the patient.  Is planning surgery with Dr. Denman George after the beginning of the year.  She will follow-up with her in reference to this. 3. Varicosities left leg.  Aching to the patient  while standing.  No evidence of thrombosis or inflammation.  Recommended follow-up with vein specialist to consider treatment options.  Patient is going to wait until her ear surgery and her pelvic cyst surgery to pursue later this coming year. 4. Mammography due now and patient will follow-up for this.  Breast exam normal today. 5. Colonoscopy 2017.  Repeat at their recommended interval. 6. Osteopenia.  DEXA 2017 T score -1.4 FRAX 15% / 2%.  Will plan repeat next year at 2-3-year interval. 7. Pap smear 2016.  No Pap smear done today.  No history of abnormal Pap smears.  Options to stop screening altogether versus less frequent screening intervals reviewed.  Will readdress on an annual basis. 8. Health maintenance.  No routine lab work done as patient does this elsewhere.  Follow-up 1 year, sooner as needed.   [name] MD, 11:08 AM 06/21/2017

## 2017-06-21 NOTE — Patient Instructions (Signed)
Follow-up in 1 year for GYN exam.  Sooner if any issues.

## 2017-06-22 ENCOUNTER — Encounter: Payer: Medicare Other | Admitting: Gynecology

## 2017-06-22 DIAGNOSIS — H6091 Unspecified otitis externa, right ear: Secondary | ICD-10-CM | POA: Diagnosis not present

## 2017-06-22 DIAGNOSIS — Z888 Allergy status to other drugs, medicaments and biological substances status: Secondary | ICD-10-CM | POA: Diagnosis not present

## 2017-06-22 DIAGNOSIS — Z79899 Other long term (current) drug therapy: Secondary | ICD-10-CM | POA: Diagnosis not present

## 2017-06-22 DIAGNOSIS — I10 Essential (primary) hypertension: Secondary | ICD-10-CM | POA: Diagnosis not present

## 2017-06-22 DIAGNOSIS — Z7982 Long term (current) use of aspirin: Secondary | ICD-10-CM | POA: Diagnosis not present

## 2017-06-22 DIAGNOSIS — H7291 Unspecified perforation of tympanic membrane, right ear: Secondary | ICD-10-CM | POA: Diagnosis not present

## 2017-06-22 DIAGNOSIS — H60311 Diffuse otitis externa, right ear: Secondary | ICD-10-CM | POA: Diagnosis not present

## 2017-06-23 ENCOUNTER — Encounter (HOSPITAL_COMMUNITY): Payer: Self-pay

## 2017-06-23 ENCOUNTER — Ambulatory Visit (HOSPITAL_COMMUNITY)
Admission: RE | Admit: 2017-06-23 | Discharge: 2017-06-23 | Disposition: A | Discharge status: Home / Self Care | Payer: Medicare Other | Source: Ambulatory Visit | Attending: Gynecology | Admitting: Gynecology

## 2017-06-23 DIAGNOSIS — Z1231 Encounter for screening mammogram for malignant neoplasm of breast: Secondary | ICD-10-CM | POA: Insufficient documentation

## 2017-06-26 ENCOUNTER — Encounter: Payer: Medicare Other | Admitting: Gynecology

## 2017-06-28 DIAGNOSIS — S0921XD Traumatic rupture of right ear drum, subsequent encounter: Secondary | ICD-10-CM | POA: Diagnosis not present

## 2017-06-28 DIAGNOSIS — H60311 Diffuse otitis externa, right ear: Secondary | ICD-10-CM | POA: Diagnosis not present

## 2017-06-28 DIAGNOSIS — S0921XS Traumatic rupture of right ear drum, sequela: Secondary | ICD-10-CM | POA: Diagnosis not present

## 2017-07-06 DIAGNOSIS — S0921XD Traumatic rupture of right ear drum, subsequent encounter: Secondary | ICD-10-CM | POA: Diagnosis not present

## 2017-07-06 DIAGNOSIS — S0921XS Traumatic rupture of right ear drum, sequela: Secondary | ICD-10-CM | POA: Diagnosis not present

## 2017-07-12 ENCOUNTER — Ambulatory Visit: Payer: Medicare Other | Admitting: Family Medicine

## 2017-07-12 ENCOUNTER — Ambulatory Visit (INDEPENDENT_AMBULATORY_CARE_PROVIDER_SITE_OTHER): Payer: Medicare Other | Admitting: Family Medicine

## 2017-07-12 ENCOUNTER — Encounter: Payer: Self-pay | Admitting: Family Medicine

## 2017-07-12 VITALS — BP 150/71 | HR 58 | Temp 97.9°F | Resp 16 | Wt 128.0 lb

## 2017-07-12 DIAGNOSIS — G8929 Other chronic pain: Secondary | ICD-10-CM | POA: Diagnosis not present

## 2017-07-12 DIAGNOSIS — G894 Chronic pain syndrome: Secondary | ICD-10-CM

## 2017-07-12 DIAGNOSIS — M542 Cervicalgia: Secondary | ICD-10-CM

## 2017-07-12 DIAGNOSIS — M545 Low back pain, unspecified: Secondary | ICD-10-CM

## 2017-07-12 DIAGNOSIS — F4323 Adjustment disorder with mixed anxiety and depressed mood: Secondary | ICD-10-CM

## 2017-07-12 DIAGNOSIS — R102 Pelvic and perineal pain: Secondary | ICD-10-CM | POA: Diagnosis not present

## 2017-07-12 DIAGNOSIS — I10 Essential (primary) hypertension: Secondary | ICD-10-CM

## 2017-07-12 LAB — BASIC METABOLIC PANEL
BUN: 17 mg/dL (ref 6–23)
CO2: 29 mEq/L (ref 19–32)
Calcium: 9.4 mg/dL (ref 8.4–10.5)
Chloride: 100 mEq/L (ref 96–112)
Creatinine, Ser: 0.88 mg/dL (ref 0.40–1.20)
GFR: 66.99 mL/min (ref >60.00)
Glucose, Bld: 88 mg/dL (ref 70–99)
Potassium: 4.2 mEq/L (ref 3.5–5.1)
Sodium: 135 mEq/L (ref 135–145)

## 2017-07-12 MED ORDER — HYDROCODONE-ACETAMINOPHEN 5-325 MG PO TABS: ORAL_TABLET | ORAL | 0 refills | Status: DC

## 2017-07-12 MED ORDER — LISINOPRIL 10 MG PO TABS: 10.0000 mg | ORAL_TABLET | Freq: Every day | ORAL | 2 refills | Status: DC

## 2017-07-12 MED ORDER — FEXOFENADINE HCL 60 MG PO TABS: 60.0000 mg | ORAL_TABLET | Freq: Two times a day (BID) | ORAL | 6 refills | Status: DC

## 2017-07-12 NOTE — Progress Notes (Signed)
OFFICE VISIT  07/12/2017   CC:  Chief Complaint  Patient presents with  . Follow-up    RCI    HPI:    Patient is a 72 y.o. Caucasian female who presents for f/u HTN, chronic pain syndrome, and adjustment d/o with anx/dep features.  HTN: home monitoring 150s/70s, HR 60s.  Compliant with antihypertensives. In a lot of chronic pain.  Anx/dep: stable, does well with prn use of xanax.  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  Indication for chronic opioid: chronic low back and neck pain from DDD, not candidate for further surgical intervention, failed ESI. Medication and dose: vicodin 5/325, 1 tid prn # pills per month: 90 Last UDS date: 12/06/16 Pain contract signed (Y/N):  Y, 12/05/16. Date narcotic database last reviewed (include red flags): today, no red flags.  Pain control is OK, neck and LB hurt significantly most days, takes an avg of 1.5 vicodin per day, occ up to 3.  Takes no NSAIDs.  Also has pain from her pelvic cyst.  ROS: no HAs, no dizziness, no CP, no SOB  Past Medical History:  Diagnosis Date  . Allergic rhinitis    maple pollen  . Anxiety   . Arthritis   . Bowel obstruction ([place])   . Chronic low back pain    Dr. Trenton Gammon.  Has had back injection--bp elevated after.  . DDD (degenerative disc disease), cervical    Hx of ACDF (Dr. [name]).  Followed by Dr. [name].  Also, Dr. [name] do left C7-T1 intralaminal epidural injection.  . Diverticulosis of colon   . DJD (degenerative joint disease)   . Gallstones   . GERD (gastroesophageal reflux disease)   . Herpes zoster 07/08/2014  . Hypercholesteremia    mild; pt declined statin trial 05/2014--needs recheck lipid panel at first f/u visit in 2017  . Hypertension   . Osteopenia 07/2015   T score of -1.4  FRAX15%/2%  . Ovarian cyst 2017   GYN following:  f/u imaging 02/2016 showed stability.  Her GYN referred her to [place] for 2nd opinion 03/02/16.  Gyn onc felt surgery likely not necessary but gave pt option of elective resection---pt declined.  04/2017 f/u: pt symptomatic and to reconsider the option of surgical resection (robotic assisted BSO).  Pt more symptomatic late 2018 and plans on surgery with Dr. Denman George in 2019.  Marland Kitchen Perianal dermatitis    prn cutivate  . Phlebitis   . Right knee injury 2018   Patellofemoral crush injury--Dr. [name].  . Trochanteric bursitis of right hip    Recurrent (injection by Dr. [name] 05/26/15)  . 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 Benjamine Mola, will likely get hearing aid.  F/u 03/2017 w/Dr. Yates Decamp sign of infection, option of TM repair or amplification--pt to think abnout it.  As of 05/2017, plan is ossiculoplasty and tympanoplasty with Dr. May on 08/30/16 [facility]).  . Varicose vein    left leg     Past Surgical History:  Procedure Laterality Date  . ABDOMINAL HYSTERECTOMY     TAH  . ANTERIOR AND POSTERIOR REPAIR N/A 03/18/2014   Procedure: Cystocele repair with graft, Vault suspension, Rectocele repair;  Surgeon: [name], MD;  Location: [facility];  Service: Urology;  Laterality: N/A;  . CHOLECYSTECTOMY    .  CHOLECYSTECTOMY, LAPAROSCOPIC    . COLON SURGERY    . COLONOSCOPY  04/2006; 05/26/16   2007 (Dr. [name]): Normal.  04/2016 (Dr. [name]) normal except diverticulosis and decreased anal sphincter tone.  No repeat colonoscopy is recommended due to age.  . cspine surgery     Dr. Wiliam Ke level ant cerv discectomy /fusion w/plating  . CYSTO N/A 03/18/2014   Procedure: CYSTO;  Surgeon: [name], MD;  Location: [facility];  Service: Urology;  Laterality: N/A;  . DEXA  07/30/2015   Osteopenia--repeat 2 yrs (Dr. Reino Kent)  . RECTOCELE REPAIR     with prolasped bledder repair  . RESECTION OF COLON      BENIGN TUMOR  . right hemicolectomy for diverticulitis with abscess  1993  . TUBAL LIGATION      Outpatient Medications Prior to Visit  Medication Sig Dispense Refill  . ALPRAZolam (XANAX) 0.5 MG tablet Take 1 tablet (0.5 mg total) by mouth 3 (three) times daily as needed for anxiety. 90 tablet 5  . aspirin 81 MG tablet Take 81 mg by mouth daily.    . Biotin 10000 MCG TABS Take 1 tablet by mouth daily.    . calcium carbonate (OSCAL) 1500 (600 Ca) MG TABS tablet Take 600 mg of elemental calcium by mouth daily with breakfast.    . estradiol (ESTRACE VAGINAL) 0.1 MG/GM vaginal cream Apply 1/4 applicator 3 times weekly vaginally. (Patient taking differently: Apply 1/4 applicator 3 times weekly vaginally. As needed) 42.5 g 2  . fluticasone (CUTIVATE) 0.05 % cream Apply to affected area bid prn 30 g 1  . fluticasone (FLONASE) 50 MCG/ACT nasal spray Place 2 sprays into both nostrils at bedtime. 48 g 3  . meloxicam (MOBIC) 15 MG tablet TAKE 1 TABLET EVERY DAY WITH FOOD AS NEEDED FOR MUSCULOSKELETAL PAIN 90 tablet 1  . Multiple Vitamin (MULTIVITAMIN) capsule Take 1 capsule by mouth daily.    Marland Kitchen POTASSIUM PO Take by mouth.    . Red Yeast Rice 600 MG CAPS Take by mouth.    . verapamil (CALAN-SR) 180 MG CR tablet TAKE 1 TABLET AT BEDTIME NEEDS TO BE SEEN 90 tablet 0  . vitamin B-12 (CYANOCOBALAMIN) 1000 MCG tablet Take 2,000 mcg by mouth daily.    . Vitamin D, Cholecalciferol, 1000 units CAPS Take 5,000 Units by mouth daily.    . fexofenadine (ALLEGRA) 60 MG tablet Take 1 tablet (60 mg total) by mouth 2 (two) times daily. 60 tablet 6  . HYDROcodone-acetaminophen (NORCO/VICODIN) 5-325 MG tablet 1 tab po tid prn pain 90 tablet 0  . lisinopril (PRINIVIL,ZESTRIL) 10 MG tablet TAKE 1/2 TABLET (5 MG TOTAL) BY MOUTH AT BEDTIME. IF B/P GREATER THAN 140/90 PATIENT TAKES 1 TABLET 90 tablet 1  . CIPRODEX OTIC suspension Place 4 drops into the right ear 2 (two) times daily.  0  . ibuprofen (ADVIL,MOTRIN) 800 MG  tablet Take 1 tablet (800 mg total) every 8 (eight) hours as needed by mouth. (Patient not taking: Reported on 07/12/2017) 60 tablet 1   Facility-Administered Medications Prior to Visit  Medication Dose Route Frequency Provider Last Rate Last Dose  . 0.9 %  sodium chloride infusion  500 mL Intravenous Continuous Gatha Mayer, MD        Allergies  Allergen Reactions  . Gabapentin Other (See Comments)    Elevated heart rate and crazy dreams  . Prednisone     REACTION: funny feeling    ROS As per HPI  PE: Blood pressure Marland Kitchen)  150/71, pulse (!) 58, temperature 97.9 F (36.6 C), temperature source Oral, resp. rate 16, weight 128 lb (58.1 kg), last menstrual period 06/27/1972, SpO2 97 %. Gen: Alert, well appearing.  Patient is oriented to person, place, time, and situation. AFFECT: pleasant, lucid thought and speech. CV: RRR, no m/r/g.   LUNGS: CTA bilat, nonlabored resps, good aeration in all lung fields. EXT: no clubbing, cyanosis, or edema.    LABS:  Lab Results  Component Value Date   TSH 1.71 01/27/2017   Lab Results  Component Value Date   WBC 5.5 08/08/2016   HGB 13.4 08/08/2016   HCT 39.2 08/08/2016   MCV 89.3 08/08/2016   PLT 195.0 08/08/2016   Lab Results  Component Value Date   CREATININE 0.88 01/27/2017   BUN 11 01/27/2017   NA 138 01/27/2017   K 4.4 01/27/2017   CL 102 01/27/2017   CO2 29 01/27/2017   Lab Results  Component Value Date   ALT 21 08/08/2016   AST 23 08/08/2016   ALKPHOS 71 08/08/2016   BILITOT 0.7 08/08/2016   Lab Results  Component Value Date   CHOL 190 01/27/2017   Lab Results  Component Value Date   HDL 71.00 01/27/2017   Lab Results  Component Value Date   LDLCALC 107 (H) 01/27/2017   Lab Results  Component Value Date   TRIG 57.0 01/27/2017   Lab Results  Component Value Date   CHOLHDL 3 01/27/2017   Lab Results  Component Value Date   HGBA1C 4.9 01/30/2017   IMPRESSION AND PLAN:  1) HTN, no ideal control.   Complicated by chronic pain. Increase lisinopril to 10mg  qd. BMET today. Continue home bp/hr monitoring.  2) Chronic pain syndrome: The current medical regimen is effective;  continue present plan and medications. I printed rx's for vicodin 5/325, 1 tid prn, #90 today for this month, Feb 2019, and Mar 2019.  Appropriate fill on/after date was noted on each rx.  3) Adjustment d/o with mixed anx/dep: coping pretty well, continue prn xanax.  An After Visit Summary was printed and given to the patient.  FOLLOW UP: Return in about 3 months (around 10/10/2017) for routine chronic illness f/u/chronic pain.  Signed:  Crissie Sickles, MD           07/12/2017

## 2017-07-14 DIAGNOSIS — H9211 Otorrhea, right ear: Secondary | ICD-10-CM | POA: Diagnosis not present

## 2017-07-14 DIAGNOSIS — S0921XD Traumatic rupture of right ear drum, subsequent encounter: Secondary | ICD-10-CM | POA: Diagnosis not present

## 2017-07-14 DIAGNOSIS — S0921XS Traumatic rupture of right ear drum, sequela: Secondary | ICD-10-CM | POA: Diagnosis not present

## 2017-07-25 DIAGNOSIS — H9211 Otorrhea, right ear: Secondary | ICD-10-CM | POA: Diagnosis not present

## 2017-07-25 DIAGNOSIS — S0921XD Traumatic rupture of right ear drum, subsequent encounter: Secondary | ICD-10-CM | POA: Diagnosis not present

## 2017-07-25 DIAGNOSIS — S0921XS Traumatic rupture of right ear drum, sequela: Secondary | ICD-10-CM | POA: Diagnosis not present

## 2017-07-27 ENCOUNTER — Telehealth: Payer: Self-pay | Admitting: *Deleted

## 2017-07-27 ENCOUNTER — Ambulatory Visit: Payer: Medicare Other | Admitting: Pediatrics

## 2017-07-27 NOTE — Telephone Encounter (Signed)
Called and spoke with the patient, moved appt from February 22nd to February 25th.

## 2017-08-01 DIAGNOSIS — S0921XD Traumatic rupture of right ear drum, subsequent encounter: Secondary | ICD-10-CM | POA: Diagnosis not present

## 2017-08-01 DIAGNOSIS — S0921XS Traumatic rupture of right ear drum, sequela: Secondary | ICD-10-CM | POA: Diagnosis not present

## 2017-08-08 DIAGNOSIS — S0921XS Traumatic rupture of right ear drum, sequela: Secondary | ICD-10-CM | POA: Diagnosis not present

## 2017-08-08 DIAGNOSIS — H9211 Otorrhea, right ear: Secondary | ICD-10-CM | POA: Diagnosis not present

## 2017-08-08 DIAGNOSIS — S0921XD Traumatic rupture of right ear drum, subsequent encounter: Secondary | ICD-10-CM | POA: Diagnosis not present

## 2017-08-15 DIAGNOSIS — S0921XS Traumatic rupture of right ear drum, sequela: Secondary | ICD-10-CM | POA: Diagnosis not present

## 2017-08-15 DIAGNOSIS — S0921XD Traumatic rupture of right ear drum, subsequent encounter: Secondary | ICD-10-CM | POA: Diagnosis not present

## 2017-08-18 ENCOUNTER — Ambulatory Visit: Payer: Medicare Other | Admitting: Gynecologic Oncology

## 2017-08-21 ENCOUNTER — Encounter: Payer: Self-pay | Admitting: Gynecologic Oncology

## 2017-08-21 ENCOUNTER — Inpatient Hospital Stay: Payer: Medicare Other | Attending: Gynecologic Oncology | Admitting: Gynecologic Oncology

## 2017-08-21 ENCOUNTER — Telehealth: Payer: Self-pay | Admitting: *Deleted

## 2017-08-21 VITALS — BP 138/70 | HR 68 | Temp 98.3°F | Resp 20 | Ht 60.0 in | Wt 127.3 lb

## 2017-08-21 DIAGNOSIS — R102 Pelvic and perineal pain: Secondary | ICD-10-CM | POA: Insufficient documentation

## 2017-08-21 DIAGNOSIS — Z90710 Acquired absence of both cervix and uterus: Secondary | ICD-10-CM

## 2017-08-21 DIAGNOSIS — N83201 Unspecified ovarian cyst, right side: Secondary | ICD-10-CM | POA: Diagnosis not present

## 2017-08-21 NOTE — Telephone Encounter (Signed)
Returned the patient's call and left her a message that she can arrive toady at 11:30am.

## 2017-08-21 NOTE — Progress Notes (Signed)
Consult Note: Gyn-Onc  Consult was requested by Dr. Phineas Real for the evaluation of Alison Cuevas 72 y.o. female  CC:  Chief Complaint  Patient presents with  . Ovarian mass    Assessment/Plan:  Alison Cuevas  is a 72 y.o.  year old with a moderately large (8cm) right ovarian mostly simple benign appearing cyst associated with a normal CA 125. It now appears to be symptomatic. She has a dualing issue of right ear problems requiring surgical reconstruction. She is reticent to undergo abdominal surgery until her ear is fixed. This won't be until March, 2019.  I discussed that I do not believe that it is malignant but should be removed due to its symptoms and slowly increasing size. Alison Cuevas feels strongly that she wants an interval of time between her ear surgery and this surgery for her ovary.  We are planning a robotic assisted BSO with possible staging in the summer. I will repeat US in April, 2019 and see her in May to plan a surgery for the summer.  I discussed surgical recovery and surgical risk including  bleeding, infection, damage to internal organs (such as bladder,ureters, bowels), blood clot, reoperation and rehospitalization.  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 Phineas Real for a large right ovarian cysts.  The patient has a long-standing history of bladder prolapse and recurrent UTIs for which she was being treated by Dr.MacDiarmid at [facility]rology. Due to the persistent nature of her symptoms she underwent a CT scan of the abdomen and pelvis to workup this issue further that was performed on 10/29/2015. It revealed an incidental finding of a 7.9 cm right pelvic cystic structure.  Follow-up the evaluation was performed with a repeat ultrasound on 03/02/2016. This revealed the cyst to measure a 0.5 cm in largest dimension with a second smaller cyst within it measuring 13 x 12 mm. Left ovary was normal. The  uterus was surgically absent. A CA-125 was drawn and was normal at 6.  The patient reports intermittent sharp right flank and lower abdominal pains. Is unclear what these are associated with, she thinks maybe with straining or lifting she notices some more. She is chronic back pain issues. She's also had a significant surgical history for a laparotomy with hemicolectomy, laparoscopic cholecystectomy, and total abdominal hysterectomy in her 20s for fibroid uterus and pelvic pain.  She is no concerning family history that Child psychotherapist sent out is having a likely genetic predisposition to ovarian or breast cancer.  She is next on performance status she takes no regular anticoagulant medications and has no other major medical issues.    The patient elected for expectant management as she was worried about the convalescence associated with surgical recovery. In January, 2018 she began experiencing intermittent severe abdominal pains. Repeat US in March, 2018 showed a stable 9cm ovarian cyst with homogeneous low level internal echoes and stable small eccentric thin walled 1.1cm daughter cyst. No vascularity or mural nodules.  CA 125 on 08/22/16 was normal and stable at 8.4.  The patient elected for expectant management because she did not want to manage a surgical convalescence while managing her husband's health issues.  In the meantime she developed a severe right ear problem which included erosion of the ear drum and middle ear. This is being managed by an ENT with a plan for reconstruction in March, 2019. This is causing her substantial pain, and anxiety.  She  began developing increasing lower abdominal pains in October, 2018. She saw Dr Phineas Real who was concerned that now the cyst was symptomatic she might be interested in its removal.  She wanted to hold off having surgery for the ovarian mass while she has having major concerns with her ear drum.  Repeat US of the pelvis on 05/25/17 showed a cystic  lesion is again seen in the right adnexa which shows low level internal echoes and a tiny peripheral mural cystic structure. No mural solid nodules or internal septations. This measures 10.6 x 7.1 x 10.6 cm and shows mild increase in size from approximately 8 cm on previous studies dating back to 2017. This has indeterminate but probably benign characteristics.   Interval Hx:  She has her ear drum reconstruction surgery on 08/23/17 with Dr May.  She continues to have intermittent pains in the right lower quadrant and mass effects on the bladder from the cyst.   Current Meds:  Outpatient Encounter Medications as of 08/21/2017  Medication Sig  . ALPRAZolam (XANAX) 0.5 MG tablet Take 1 tablet (0.5 mg total) by mouth 3 (three) times daily as needed for anxiety.  Marland Kitchen aspirin 81 MG tablet Take 81 mg by mouth daily.  . Biotin 10000 MCG TABS Take 1 tablet by mouth daily.  . calcium carbonate (OSCAL) 1500 (600 Ca) MG TABS tablet Take 600 mg of elemental calcium by mouth daily with breakfast.  . CIPRODEX OTIC suspension Place 4 drops into the right ear 2 (two) times daily.  Marland Kitchen estradiol (ESTRACE VAGINAL) 0.1 MG/GM vaginal cream Apply 1/4 applicator 3 times weekly vaginally. (Patient taking differently: Apply 1/4 applicator 3 times weekly vaginally. As needed)  . fexofenadine (ALLEGRA) 60 MG tablet Take 1 tablet (60 mg total) by mouth 2 (two) times daily.  . fluticasone (CUTIVATE) 0.05 % cream Apply to affected area bid prn  . fluticasone (FLONASE) 50 MCG/ACT nasal spray Place 2 sprays into both nostrils at bedtime.  Marland Kitchen HYDROcodone-acetaminophen (NORCO/VICODIN) 5-325 MG tablet 1 tab po tid prn pain  . lisinopril (PRINIVIL,ZESTRIL) 10 MG tablet Take 1 tablet (10 mg total) by mouth daily.  . meloxicam (MOBIC) 15 MG tablet TAKE 1 TABLET EVERY DAY WITH FOOD AS NEEDED FOR MUSCULOSKELETAL PAIN  . Multiple Vitamin (MULTIVITAMIN) capsule Take 1 capsule by mouth daily.  Marland Kitchen POTASSIUM PO Take by mouth.  . Red Yeast Rice  600 MG CAPS Take by mouth.  . verapamil (CALAN-SR) 180 MG CR tablet TAKE 1 TABLET AT BEDTIME NEEDS TO BE SEEN  . vitamin B-12 (CYANOCOBALAMIN) 1000 MCG tablet Take 2,000 mcg by mouth daily.  . Vitamin D, Cholecalciferol, 1000 units CAPS Take 5,000 Units by mouth daily.  . [DISCONTINUED] ibuprofen (ADVIL,MOTRIN) 800 MG tablet Take 1 tablet (800 mg total) every 8 (eight) hours as needed by mouth. (Patient not taking: Reported on 07/12/2017)   Facility-Administered Encounter Medications as of 08/21/2017  Medication  . 0.9 %  sodium chloride infusion    Allergy:  Allergies  Allergen Reactions  . Gabapentin Other (See Comments)    Elevated heart rate and crazy dreams  . Prednisone     REACTION: funny feeling    Social Hx:   Social History   Socioeconomic History  . Marital status: Married    Spouse name: [name]  . Number of children: 3  . Years of education: Not on file  . Highest education level: Not on file  Social Needs  . Financial resource strain: Not on file  .  Food insecurity - worry: Not on file  . Food insecurity - inability: Not on file  . Transportation needs - medical: Not on file  . Transportation needs - non-medical: Not on file  Occupational History  . Occupation: retired  Tobacco Use  . Smoking status: Never Smoker  . Smokeless tobacco: Never Used  Substance and Sexual Activity  . Alcohol use: No    Alcohol/week: 0.0 oz  . Drug use: No  . Sexual activity: No    Birth control/protection: Surgical    Comment: 1st intercourse 24 yo-1 partner  Other Topics Concern  . Not on file  Social History Narrative   Married, has 3 children (one lives near her).  Has 8 grandchildren, 2 greatgrandchildren.   Worked cleaning apartments, worked at [employer], was a Secretary/administrator.   She retired at age 37.   No tobacco.  No alcohol.  No drugs.   Exercise: clean, work in yard.        10 siblings in all--5 have passed away--1 child age 48 with whooping cough , 1  lupus, 1 melanoma,1 lung cancer, 1 pulm fibrosis    Past Surgical Hx:  Past Surgical History:  Procedure Laterality Date  . ABDOMINAL HYSTERECTOMY     TAH  . ANTERIOR AND POSTERIOR REPAIR N/A 03/18/2014   Procedure: Cystocele repair with graft, Vault suspension, Rectocele repair;  Surgeon: [name], MD;  Location: [facility];  Service: Urology;  Laterality: N/A;  . CHOLECYSTECTOMY    . CHOLECYSTECTOMY, LAPAROSCOPIC    . COLON SURGERY    . COLONOSCOPY  04/2006; 05/26/16   2007 (Dr. [name]): Normal.  04/2016 (Dr. [name]) normal except diverticulosis and decreased anal sphincter tone.  No repeat colonoscopy is recommended due to age.  . cspine surgery     Dr. Wiliam Ke level ant cerv discectomy /fusion w/plating  . CYSTO N/A 03/18/2014   Procedure: CYSTO;  Surgeon: [name], MD;  Location: [facility];  Service: Urology;  Laterality: N/A;  . DEXA  07/30/2015   Osteopenia--repeat 2 yrs (Dr. Reino Kent)  . RECTOCELE REPAIR     with prolasped bledder repair  . RESECTION OF COLON     BENIGN TUMOR  . right hemicolectomy for diverticulitis with abscess  1993  . TUBAL LIGATION      Past Medical Hx:  Past Medical History:  Diagnosis Date  . Allergic rhinitis    maple pollen  . Anxiety   . Arthritis   . Bowel obstruction ([place])   . Chronic low back pain    Dr. Trenton Gammon.  Has had back injection--bp elevated after.  . DDD (degenerative disc disease), cervical    Hx of ACDF (Dr. [name]).  Followed by Dr. [name].  Also, Dr. [name] do left C7-T1 intralaminal epidural injection.  . Diverticulosis of colon   . DJD (degenerative joint disease)   . Gallstones   . GERD (gastroesophageal reflux disease)   . Herpes zoster 07/08/2014  . Hypercholesteremia    mild; pt declined statin trial 05/2014--needs recheck lipid panel at first f/u visit in 2017  . Hypertension   . Osteopenia 07/2015   T score of -1.4 FRAX15%/2%  . Ovarian cyst 2017   GYN following:  f/u  imaging 02/2016 showed stability.  Her GYN referred her to [place] for 2nd opinion 03/02/16.  Gyn onc felt surgery likely not necessary but gave pt option of elective resection---pt declined.  04/2017 f/u: pt symptomatic and to reconsider the option of surgical resection (robotic  assisted BSO).  Pt more symptomatic late 2018 and plans on surgery with Dr. Denman George in 2019.  Marland Kitchen Perianal dermatitis    prn cutivate  . Phlebitis   . Right knee injury 2018   Patellofemoral crush injury--Dr. [name].  . Trochanteric bursitis of right hip    Recurrent (injection by Dr. [name] 05/26/15)  . 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 Benjamine Mola, will likely get hearing aid.  F/u 03/2017 w/Dr. Yates Decamp sign of infection, option of TM repair or amplification--pt to think abnout it.  As of 05/2017, plan is ossiculoplasty and tympanoplasty with Dr. May on 08/30/16 [facility]).  . Varicose vein    left leg     Past Gynecological History:  SVD x 2, hysterectomy in her 20's Patient's last menstrual period was 06/27/1972.  Family Hx:  Family History  Problem Relation Age of Onset  . Hypertension Mother   . Diverticulitis Mother   . Breast cancer Sister 21  . Melanoma Brother   . Prostate cancer Brother   . Leukemia Brother   . Lupus Sister   . Lung disease Brother   . Stomach cancer Father   . Other Unknown        Family member with MGUS    Review of Systems:  Constitutional  Feels well,    ENT Normal appearing ears and nares bilaterally Skin/Breast  No rash, sores, jaundice, itching, dryness Cardiovascular  No chest pain, shortness of breath, or edema  Pulmonary  No cough or wheeze.  Gastro Intestinal  No nausea, vomitting, or diarrhoea. No bright red blood per rectum, no abdominal pain, change in bowel movement, or constipation.  Genito Urinary  No frequency, urgency, dysuria, occasional lower pelvic right sided pain Musculo Skeletal  No myalgia, arthralgia,  joint swelling or pain  Neurologic  No weakness, numbness, change in gait,  Psychology  No depression, anxiety, insomnia.   Vitals:  Blood pressure 138/70, pulse 68, temperature 98.3 F (36.8 C), temperature source Oral, resp. rate 20, height 5' (1.524 m), weight 127 lb 4.8 oz (57.7 kg), last menstrual period 06/27/1972, SpO2 99 %.  Physical Exam: WD in NAD Neck  Supple NROM, without any enlargements.  Lymph Node Survey No cervical supraclavicular or inguinal adenopathy Cardiovascular  Pulse normal rate, regularity and rhythm. S1 and S2 normal.  Lungs  Clear to auscultation bilateraly, without wheezes/crackles/rhonchi. Good air movement.  Skin  No rash/lesions/breakdown  Psychiatry  Alert and oriented to person, place, and time  Abdomen  Normoactive bowel sounds, abdomen soft, non-tender and thin without evidence of hernia.  Back No CVA tenderness Genito Urinary  Vulva/vagina: Normal external female genitalia.  No lesions. No discharge or bleeding.  Bladder/urethra:  No lesions or masses, well supported bladder  Vagina: normal  Cervix: surgically absent  Uterus: surgically absent   Adnexa: can appreciate a smooth walled mass on the mid pelvis, approximately 10cm, minimally mobile but not apparently attached to the vaginal cuff or rectum. Rectal  Good tone, no masses no cul de sac nodularity.  Extremities  No bilateral cyanosis, clubbing or edema.   Donaciano Eva, MD  08/21/2017, 12:21 PM

## 2017-08-21 NOTE — Patient Instructions (Signed)
Dr Denman George has scheduled you for an ultrasound in April, 2019. You will return to see her in May, 2019 and discuss a surgery to remove the ovaries in the summer, 2019.

## 2017-08-23 DIAGNOSIS — H7201 Central perforation of tympanic membrane, right ear: Secondary | ICD-10-CM | POA: Diagnosis not present

## 2017-08-23 DIAGNOSIS — I10 Essential (primary) hypertension: Secondary | ICD-10-CM | POA: Diagnosis not present

## 2017-08-23 DIAGNOSIS — Z888 Allergy status to other drugs, medicaments and biological substances status: Secondary | ICD-10-CM | POA: Diagnosis not present

## 2017-08-23 DIAGNOSIS — H61301 Acquired stenosis of right external ear canal, unspecified: Secondary | ICD-10-CM | POA: Diagnosis not present

## 2017-08-23 DIAGNOSIS — H7291 Unspecified perforation of tympanic membrane, right ear: Secondary | ICD-10-CM | POA: Diagnosis not present

## 2017-08-23 DIAGNOSIS — H60311 Diffuse otitis externa, right ear: Secondary | ICD-10-CM | POA: Diagnosis not present

## 2017-08-23 DIAGNOSIS — K219 Gastro-esophageal reflux disease without esophagitis: Secondary | ICD-10-CM | POA: Diagnosis not present

## 2017-08-23 HISTORY — PX: OTHER SURGICAL HISTORY: SHX169

## 2017-08-25 ENCOUNTER — Telehealth: Payer: Self-pay | Admitting: Family Medicine

## 2017-08-25 NOTE — Telephone Encounter (Signed)
Pt called stating that she had Right ear surgery on Thursday and was given pain medication by Dr Jenny Reichmann May; she states that her children got her pain medication prescription filled (hydrocodone); she wants to make Dr Anitra Lauth aware; the pt can be contacted at [phone]; spoke with Nira Conn regarding this; will also route to office for notification of this encounter.

## 2017-08-26 ENCOUNTER — Encounter: Payer: Self-pay | Admitting: Family Medicine

## 2017-08-29 DIAGNOSIS — H61311 Acquired stenosis of right external ear canal secondary to trauma: Secondary | ICD-10-CM | POA: Diagnosis not present

## 2017-08-29 DIAGNOSIS — S0921XS Traumatic rupture of right ear drum, sequela: Secondary | ICD-10-CM | POA: Diagnosis not present

## 2017-08-29 DIAGNOSIS — Z48810 Encounter for surgical aftercare following surgery on the sense organs: Secondary | ICD-10-CM | POA: Diagnosis not present

## 2017-08-31 ENCOUNTER — Inpatient Hospital Stay (HOSPITAL_COMMUNITY)
Admission: EM | Admit: 2017-08-31 | Discharge: 2017-09-01 | DRG: 641 | Disposition: A | Discharge status: Home / Self Care | Payer: Medicare Other | Attending: Family Medicine | Admitting: Family Medicine

## 2017-08-31 ENCOUNTER — Other Ambulatory Visit: Payer: Self-pay

## 2017-08-31 ENCOUNTER — Encounter (HOSPITAL_COMMUNITY): Payer: Self-pay

## 2017-08-31 DIAGNOSIS — M545 Low back pain, unspecified: Secondary | ICD-10-CM | POA: Diagnosis present

## 2017-08-31 DIAGNOSIS — E860 Dehydration: Secondary | ICD-10-CM | POA: Diagnosis present

## 2017-08-31 DIAGNOSIS — H7291 Unspecified perforation of tympanic membrane, right ear: Secondary | ICD-10-CM

## 2017-08-31 DIAGNOSIS — K219 Gastro-esophageal reflux disease without esophagitis: Secondary | ICD-10-CM

## 2017-08-31 DIAGNOSIS — N390 Urinary tract infection, site not specified: Secondary | ICD-10-CM | POA: Diagnosis not present

## 2017-08-31 DIAGNOSIS — E7800 Pure hypercholesterolemia, unspecified: Secondary | ICD-10-CM | POA: Diagnosis present

## 2017-08-31 DIAGNOSIS — G8929 Other chronic pain: Secondary | ICD-10-CM | POA: Diagnosis present

## 2017-08-31 DIAGNOSIS — M8580 Other specified disorders of bone density and structure, unspecified site: Secondary | ICD-10-CM | POA: Diagnosis present

## 2017-08-31 DIAGNOSIS — I10 Essential (primary) hypertension: Secondary | ICD-10-CM

## 2017-08-31 DIAGNOSIS — R531 Weakness: Secondary | ICD-10-CM

## 2017-08-31 DIAGNOSIS — N83201 Unspecified ovarian cyst, right side: Secondary | ICD-10-CM | POA: Diagnosis present

## 2017-08-31 DIAGNOSIS — Z79899 Other long term (current) drug therapy: Secondary | ICD-10-CM | POA: Diagnosis not present

## 2017-08-31 DIAGNOSIS — Z791 Long term (current) use of non-steroidal anti-inflammatories (NSAID): Secondary | ICD-10-CM | POA: Diagnosis not present

## 2017-08-31 DIAGNOSIS — F419 Anxiety disorder, unspecified: Secondary | ICD-10-CM | POA: Diagnosis present

## 2017-08-31 DIAGNOSIS — Z7982 Long term (current) use of aspirin: Secondary | ICD-10-CM

## 2017-08-31 DIAGNOSIS — I959 Hypotension, unspecified: Secondary | ICD-10-CM | POA: Diagnosis not present

## 2017-08-31 DIAGNOSIS — E871 Hypo-osmolality and hyponatremia: Secondary | ICD-10-CM | POA: Diagnosis not present

## 2017-08-31 DIAGNOSIS — Z888 Allergy status to other drugs, medicaments and biological substances status: Secondary | ICD-10-CM

## 2017-08-31 DIAGNOSIS — R112 Nausea with vomiting, unspecified: Secondary | ICD-10-CM | POA: Diagnosis not present

## 2017-08-31 DIAGNOSIS — I951 Orthostatic hypotension: Secondary | ICD-10-CM

## 2017-08-31 LAB — COMPREHENSIVE METABOLIC PANEL
ALT: 22 U/L (ref 14–54)
AST: 25 U/L (ref 15–41)
Albumin: 3.9 g/dL (ref 3.5–5.0)
Alkaline Phosphatase: 51 U/L (ref 38–126)
Anion gap: 9 (ref 5–15)
BUN: 18 mg/dL (ref 6–20)
CO2: 20 mmol/L — ABNORMAL LOW (ref 22–32)
Calcium: 8.5 mg/dL — ABNORMAL LOW (ref 8.9–10.3)
Chloride: 90 mmol/L — ABNORMAL LOW (ref 101–111)
Creatinine, Ser: 0.86 mg/dL (ref 0.44–1.00)
GFR calc Af Amer: >60 mL/min (ref >60)
GFR calc non Af Amer: >60 mL/min (ref >60)
Glucose, Bld: 120 mg/dL — ABNORMAL HIGH (ref 65–99)
Potassium: 4.3 mmol/L (ref 3.5–5.1)
Sodium: 119 mmol/L — CL (ref 135–145)
Total Bilirubin: 0.9 mg/dL (ref 0.3–1.2)
Total Protein: 6.7 g/dL (ref 6.5–8.1)

## 2017-08-31 LAB — URINALYSIS, ROUTINE W REFLEX MICROSCOPIC
Bacteria, UA: NONE SEEN
Bilirubin Urine: NEGATIVE
Glucose, UA: NEGATIVE mg/dL
Hgb urine dipstick: NEGATIVE
Ketones, ur: NEGATIVE mg/dL
Nitrite: NEGATIVE
Protein, ur: NEGATIVE mg/dL
Specific Gravity, Urine: 1.006 (ref 1.005–1.030)
Squamous Epithelial / LPF: NONE SEEN
pH: 6 (ref 5.0–8.0)

## 2017-08-31 LAB — BRAIN NATRIURETIC PEPTIDE: B Natriuretic Peptide: 111 pg/mL — ABNORMAL HIGH (ref 0.0–100.0)

## 2017-08-31 LAB — CK: Total CK: 102 U/L (ref 38–234)

## 2017-08-31 LAB — CBC WITH DIFFERENTIAL/PLATELET
Basophils Absolute: 0 10*3/uL (ref 0.0–0.1)
Basophils Relative: 0 %
Eosinophils Absolute: 0.1 10*3/uL (ref 0.0–0.7)
Eosinophils Relative: 2 %
HCT: 34.2 % — ABNORMAL LOW (ref 36.0–46.0)
Hemoglobin: 12.2 g/dL (ref 12.0–15.0)
Lymphocytes Relative: 37 %
Lymphs Abs: 2.2 10*3/uL (ref 0.7–4.0)
MCH: 30.6 pg (ref 26.0–34.0)
MCHC: 35.7 g/dL (ref 30.0–36.0)
MCV: 85.7 fL (ref 78.0–100.0)
Monocytes Absolute: 0.6 10*3/uL (ref 0.1–1.0)
Monocytes Relative: 10 %
Neutro Abs: 3.2 10*3/uL (ref 1.7–7.7)
Neutrophils Relative %: 51 %
Platelets: 189 10*3/uL (ref 150–400)
RBC: 3.99 MIL/uL (ref 3.87–5.11)
RDW: 11.9 % (ref 11.5–15.5)
WBC: 6.1 10*3/uL (ref 4.0–10.5)

## 2017-08-31 LAB — INFLUENZA PANEL BY PCR (TYPE A & B)
Influenza A By PCR: NEGATIVE
Influenza B By PCR: NEGATIVE

## 2017-08-31 LAB — TSH: TSH: 0.79 u[IU]/mL (ref 0.350–4.500)

## 2017-08-31 LAB — TROPONIN I: Troponin I: <0.03 ng/mL (ref <0.03)

## 2017-08-31 MED ORDER — BISACODYL 5 MG PO TBEC: 5.0000 mg | DELAYED_RELEASE_TABLET | Freq: Every day | ORAL | Status: DC | PRN

## 2017-08-31 MED ORDER — LISINOPRIL 10 MG PO TABS
10.0000 mg | ORAL_TABLET | Freq: Every day | ORAL | Status: DC
  Administered 2017-08-31 – 2017-09-01 (×2): 10 mg via ORAL
  Filled 2017-08-31 (×2): qty 1

## 2017-08-31 MED ORDER — ENOXAPARIN SODIUM 40 MG/0.4ML ~~LOC~~ SOLN
40.0000 mg | SUBCUTANEOUS | Status: DC
  Administered 2017-08-31: 40 mg via SUBCUTANEOUS
  Filled 2017-08-31: qty 0.4

## 2017-08-31 MED ORDER — SODIUM CHLORIDE 0.9 % IV SOLN
1.0000 g | INTRAVENOUS | Status: DC
  Administered 2017-08-31 – 2017-09-01 (×2): 1 g via INTRAVENOUS
  Filled 2017-08-31: qty 1
  Filled 2017-08-31 (×4): qty 10

## 2017-08-31 MED ORDER — VERAPAMIL HCL ER 180 MG PO TBCR
180.0000 mg | EXTENDED_RELEASE_TABLET | Freq: Every day | ORAL | Status: DC
  Administered 2017-08-31: 180 mg via ORAL
  Filled 2017-08-31: qty 1

## 2017-08-31 MED ORDER — ASPIRIN 81 MG PO CHEW
81.0000 mg | CHEWABLE_TABLET | Freq: Every day | ORAL | Status: DC
  Administered 2017-08-31 – 2017-09-01 (×2): 81 mg via ORAL
  Filled 2017-08-31 (×2): qty 1

## 2017-08-31 MED ORDER — HYDROCODONE-ACETAMINOPHEN 5-325 MG PO TABS
1.0000 | ORAL_TABLET | Freq: Three times a day (TID) | ORAL | Status: DC | PRN
Start: 2017-08-31 — End: 2017-09-01

## 2017-08-31 MED ORDER — ONDANSETRON 4 MG PO TBDP: 4.0000 mg | ORAL_TABLET | Freq: Three times a day (TID) | ORAL | Status: DC | PRN

## 2017-08-31 MED ORDER — ACETAMINOPHEN 325 MG PO TABS: 650.0000 mg | ORAL_TABLET | Freq: Four times a day (QID) | ORAL | Status: DC | PRN

## 2017-08-31 MED ORDER — ALPRAZOLAM 0.5 MG PO TABS
0.5000 mg | ORAL_TABLET | Freq: Three times a day (TID) | ORAL | Status: DC | PRN
  Administered 2017-08-31: 0.5 mg via ORAL
  Filled 2017-08-31: qty 1

## 2017-08-31 MED ORDER — SODIUM CHLORIDE 0.9 % IV BOLUS (SEPSIS)
1000.0000 mL | Freq: Once | INTRAVENOUS | Status: AC
  Administered 2017-08-31: 1000 mL via INTRAVENOUS

## 2017-08-31 MED ORDER — SODIUM CHLORIDE 0.9 % IV SOLN
INTRAVENOUS | Status: DC
  Administered 2017-08-31 – 2017-09-01 (×2): via INTRAVENOUS

## 2017-08-31 MED ORDER — ACETAMINOPHEN 650 MG RE SUPP: 650.0000 mg | Freq: Four times a day (QID) | RECTAL | Status: DC | PRN

## 2017-08-31 NOTE — H&P (Signed)
History and Physical  Alison Cuevas NID:[id number] DOB: 04-04-1946 DOA: 08/31/2017  Referring physician: [place] PCP: [name], MD   Chief Complaint: weakness  HPI: Alison Cuevas is a 72 y.o. female who recently had surgery for a ruptured right tympanic membrane presented to the ED with generalized weakness and malaise.  She also reports frequent urination.  Patient presents with "feeling dehydrated".  States she has not felt well for the past week.  She has had generalized weakness, poor appetite, fatigue.  No fevers.    She denies chest pain and shortness of breath.  She endorses one episode of vomiting yesterday.  No diarrhea.  Patient had surgery on her right ear in February 27 for perforated tympanic membrane performed by Dr. May.  She was doing well prior to this.  She is been taking hydrocodone which she takes on a regular basis for her chronic pain.  She denies any pain with urination or blood in the urine.  She describes that she came in tonight because her "muscles were jumping" and she was not able to sleep.  She denies any dizziness or lightheadedness.  No chest pain or shortness of breath.  No cough, runny nose or sore throat.  She did not receive a flu shot.  Her primary symptoms include no dizziness. Associated symptoms include vomiting. Pertinent negatives include no shortness of breath, no chest pain and no headaches.   ED course: The patient was evaluated in the emergency department and noted to have a UTI with white blood cells noted in the urine.  The patient also was noted to have clinical signs of dehydration with dry mucous membranes and noted to be hyponatremic with a sodium of 119.  She was treated with IV fluid normal saline and admission was requested for further evaluation and management.  Review of Systems:  Constitutional: Positive for activity change, appetite change and fatigue. Negative for fever.  HENT: Positive for ear pain. Negative for congestion and  rhinorrhea.   Eyes: Negative for visual disturbance.  Respiratory: Negative for cough, chest tightness and shortness of breath.   Cardiovascular: Negative for chest pain and leg swelling.  Gastrointestinal: Positive for nausea and vomiting. Negative for abdominal pain.  Genitourinary: Negative for dysuria and hematuria.  Musculoskeletal: Positive for arthralgias and myalgias.  Skin: Negative for wound.  Neurological: Positive for weakness. Negative for dizziness and headaches.  All other systems are negative except as noted in the HPI and PMH.   Past Medical History:  Diagnosis Date  . Allergic rhinitis    maple pollen  . Anxiety   . Arthritis   . Bowel obstruction ([place])   . Chronic low back pain    Dr. Trenton Gammon.  Has had back injection--bp elevated after.  . DDD (degenerative disc disease), cervical    Hx of ACDF (Dr. [name]).  Followed by Dr. [name].  Also, Dr. [name] do left C7-T1 intralaminal epidural injection.  . Diverticulosis of colon   . DJD (degenerative joint disease)   . Gallstones   . GERD (gastroesophageal reflux disease)   . Herpes zoster 07/08/2014  . Hypercholesteremia    mild; pt declined statin trial 05/2014--needs recheck lipid panel at first f/u visit in 2017  . Hypertension   . Osteopenia 07/2015   T score of -1.4 FRAX15%/2%  . Ovarian cyst 2017   GYN following:  f/u imaging 02/2016 showed stability.  Her GYN referred her to [facility] for 2nd opinion 03/02/16.  Gyn onc felt surgery  likely not necessary but gave pt option of elective resection---pt declined.  04/2017 f/u: pt symptomatic and to reconsider the option of surgical resection (robotic assisted BSO).  Pt more symptomatic late 2018 and plans on surgery with Dr. Denman George in summer 2019.  Marland Kitchen Perianal dermatitis    prn cutivate  . Phlebitis   . Right knee injury 2018   Patellofemoral crush injury--Dr. [name].  . Trochanteric bursitis of right hip    Recurrent (injection by Dr. [name]  05/26/15)  . 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 Benjamine Mola, will likely get hearing aid.  F/u 03/2017 w/Dr. Yates Decamp sign of infection, option of TM repair or amplification--pt to think abnout it.  As of 05/2017, plan is ossiculoplasty and tympanoplasty with Dr. May on 08/30/16 [facility]).  . Varicose vein    left leg    Past Surgical History:  Procedure Laterality Date  . ABDOMINAL HYSTERECTOMY     TAH  . ANTERIOR AND POSTERIOR REPAIR N/A 03/18/2014   Procedure: Cystocele repair with graft, Vault suspension, Rectocele repair;  Surgeon: [name], MD;  Location: [facility];  Service: Urology;  Laterality: N/A;  . CHOLECYSTECTOMY    . CHOLECYSTECTOMY, LAPAROSCOPIC    . COLON SURGERY    . COLONOSCOPY  04/2006; 05/26/16   2007 (Dr. [name]): Normal.  04/2016 (Dr. [name]) normal except diverticulosis and decreased anal sphincter tone.  No repeat colonoscopy is recommended due to age.  . cspine surgery     Dr. Wiliam Ke level ant cerv discectomy /fusion w/plating  . CYSTO N/A 03/18/2014   Procedure: CYSTO;  Surgeon: [name], MD;  Location: [facility];  Service: Urology;  Laterality: N/A;  . DEXA  07/30/2015   Osteopenia--repeat 2 yrs (Dr. Reino Kent)  . RECTOCELE REPAIR     with prolasped bledder repair  . RESECTION OF COLON     BENIGN TUMOR  . right hemicolectomy for diverticulitis with abscess  1993  . TUBAL LIGATION     Social History:  reports that  has never smoked. she has never used smokeless tobacco. She reports that she does not drink alcohol or use drugs.  Allergies  Allergen Reactions  . Gabapentin Other (See Comments)    Elevated heart rate and crazy dreams  . Prednisone     REACTION: funny feeling    Family History  Problem Relation Age of Onset  . Hypertension Mother   . Diverticulitis Mother   . Breast cancer Sister 40  . Melanoma Brother   . Prostate cancer Brother   . Leukemia Brother   . Lupus Sister   .  Lung disease Brother   . Stomach cancer Father   . Other Unknown        Family member with MGUS    Prior to Admission medications   Medication Sig Start Date End Date Taking? Authorizing Provider  ondansetron (ZOFRAN) 4 MG tablet Take 4 mg by mouth every 8 (eight) hours as needed for nausea or vomiting.   Yes [provider]  ondansetron (ZOFRAN-ODT) 4 MG disintegrating tablet Take 4 mg by mouth every 8 (eight) hours as needed for nausea or vomiting.   Yes [provider]  ALPRAZolam (XANAX) 0.5 MG tablet Take 1 tablet (0.5 mg total) by mouth 3 (three) times daily as needed for anxiety. 02/16/17   McGowenAdrian Blackwater, MD  aspirin 81 MG tablet Take 81 mg by mouth daily.    [provider]  Biotin 10000  MCG TABS Take 1 tablet by mouth daily.    [provider]  calcium carbonate (OSCAL) 1500 (600 Ca) MG TABS tablet Take 600 mg of elemental calcium by mouth daily with breakfast.    [provider]  CIPRODEX OTIC suspension Place 4 drops into the right ear 2 (two) times daily. 12/10/16   [provider]  estradiol (ESTRACE VAGINAL) 0.1 MG/GM vaginal cream Apply 1/4 applicator 3 times weekly vaginally. Patient taking differently: Apply 1/4 applicator 3 times weekly vaginally. As needed 08/05/14   Fontaine, Belinda Block, MD  fexofenadine (ALLEGRA) 60 MG tablet Take 1 tablet (60 mg total) by mouth 2 (two) times daily. 07/12/17   McGowen, Adrian Blackwater, MD  fluticasone (CUTIVATE) 0.05 % cream Apply to affected area bid prn 08/08/16   McGowen, Adrian Blackwater, MD  fluticasone (FLONASE) 50 MCG/ACT nasal spray Place 2 sprays into both nostrils at bedtime. 05/17/16   McGowen, Adrian Blackwater, MD  HYDROcodone-acetaminophen (NORCO/VICODIN) 5-325 MG tablet 1 tab po tid prn pain 07/12/17   McGowen, Adrian Blackwater, MD  lisinopril (PRINIVIL,ZESTRIL) 10 MG tablet Take 1 tablet (10 mg total) by mouth daily. 07/12/17   McGowen, Adrian Blackwater, MD  meloxicam (MOBIC) 15 MG tablet TAKE 1 TABLET EVERY DAY  WITH FOOD AS NEEDED FOR MUSCULOSKELETAL PAIN 03/27/17   McGowen, Adrian Blackwater, MD  Multiple Vitamin (MULTIVITAMIN) capsule Take 1 capsule by mouth daily.    [provider]  POTASSIUM PO Take by mouth.    [provider]  Red Yeast Rice 600 MG CAPS Take by mouth.    [provider]  verapamil (CALAN-SR) 180 MG CR tablet TAKE 1 TABLET AT BEDTIME NEEDS TO BE SEEN 06/08/17   McGowen, Adrian Blackwater, MD  vitamin B-12 (CYANOCOBALAMIN) 1000 MCG tablet Take 2,000 mcg by mouth daily.    [provider]  Vitamin D, Cholecalciferol, 1000 units CAPS Take 5,000 Units by mouth daily.    [provider]   Physical Exam: Vitals:   08/31/17 0357 08/31/17 0358 08/31/17 0630 08/31/17 0642  BP: (!) 152/70  128/79 128/79  Pulse: 94  84 76  Resp: 20   (!) 21  Temp: 98.2 F (36.8 C)   98.2 F (36.8 C)  TempSrc: Oral   Oral  SpO2: 97%  97% 97%  Weight:  55.3 kg (122 lb)    Height:  5' (1.524 m)     Constitutional: She is oriented to person, place, and time. She appears well-developed and well-nourished. No distress. Anxious appearing.  HENT: Head: Normocephalic and atraumatic.  Mouth/Throat: Oropharynx is clear and dry. No oropharyngeal exudate. Surgical dressing in place to R ear did not examine tympanic membrane.  Eyes: Conjunctivae and EOM are normal. Pupils are equal, round, and reactive to light.  Neck: Normal range of motion. Neck supple. No meningismus.  Cardiovascular: Normal rate, regular rhythm, normal heart sounds and intact distal pulses. Exam reveals no gallop. No murmur heard. Pulmonary/Chest: Effort normal and breath sounds normal. No respiratory distress.  Abdominal: Soft. There is no tenderness. There is no rebound and no guarding.  Musculoskeletal: Normal range of motion. She exhibits no edema or tenderness.  Neurological: She is alert and oriented to person, place, and time. No cranial nerve deficit. She exhibits normal muscle tone. Coordination normal.   5/5 strength throughout. CN 2-12 intact.Equal grip strength. Mildly tremulous.  Skin: Skin is warm. Capillary refill takes less than 2 seconds.  Psychiatric: She has a normal mood and affect. Her behavior is normal.  Labs on Admission:  Basic Metabolic Panel: Recent Labs  Lab 08/31/17 0413  NA 119*  K 4.3  CL 90*  CO2 20*  GLUCOSE 120*  BUN 18  CREATININE 0.86  CALCIUM 8.5*   Liver Function Tests: Recent Labs  Lab 08/31/17 0413  AST 25  ALT 22  ALKPHOS 51  BILITOT 0.9  PROT 6.7  ALBUMIN 3.9   No results for input(s): LIPASE, AMYLASE in the last 168 hours. No results for input(s): AMMONIA in the last 168 hours. CBC: Recent Labs  Lab 08/31/17 0413  WBC 6.1  NEUTROABS 3.2  HGB 12.2  HCT 34.2*  MCV 85.7  PLT 189   Cardiac Enzymes: Recent Labs  Lab 08/31/17 0413  CKTOTAL 102  TROPONINI <0.03    BNP (last 3 results) No results for input(s): PROBNP in the last 8760 hours. CBG: No results for input(s): GLUCAP in the last 168 hours.  Radiological Exams on Admission: No results found.  EKG: Independently reviewed. Pending.   Assessment/Plan Principal Problem:   Hyponatremia Active Problems:   GERD   Chronic low back pain   Hypertension   Osteopenia   Right ovarian cyst   Generalized weakness   Dehydration   Tympanic membrane rupture, right  1. Hyponatremia-suspect this is secondary to dehydration given her recent surgery and poor oral intake.  She was noted to have a normal sodium value in January of this year.  She will be treated with IV fluid hydration with normal saline.  Will monitor serial BMP testing.  Consider nephrology evaluation if improvement.  The patient is adamant that she does not have cancer in her ovarian mass per recent follow-up with gynecology oncology.  If this was a concern I would wonder about SIADH from malignancy. 2. UTI-treating with IV ceftriaxone and will follow urine culture testing. 3. Generalized Weakness-secondary to  hyponatremia and dehydration.  Fall precautions.  PT evaluation when medically improved. 4. Moderate Dehydration-treating with IV fluid hydration. 5. Right ovarian Mass-the patient reports that she is followed by gynecology/oncology and is scheduled to have the mass removed in the summer.  This is noted to be a benign mass and has been slow growing. 6. S/p right tympanic membrane rupture repair-the patient is followed closely by Dr. Lu Duffel and will follow up after discharge. 7. Essential Hypertension- monitor and resume home medications as appropriate. 8. GERD-stable, will order Protonix for GI protection. 9. Chronic low back pain-resume her home hydrocodone tablets as needed for pain symptoms. 10. Osteopenia-stable.  DVT Prophylaxis: lovenox  Code Status: Full   Family Communication:   Disposition Plan: Pending improvement in sodium levels and PT/evaluation  Time spent: 59 mins  Irwin Brakeman, MD [facility] Pager [phone]  If 7PM-7AM, please contact night-coverage [email or web address] Password [facility] 08/31/2017, 7:45 AM

## 2017-08-31 NOTE — ED Provider Notes (Signed)
[facility] EMERGENCY DEPARTMENT Provider Note   CSN: [id number] Arrival date & time: 08/31/17  0342     History   Chief Complaint Chief Complaint  Patient presents with  . Weakness    dehydration    HPI Alison Cuevas is a 72 y.o. female.  Patient presents with "feeling dehydrated".  States she is not felt well for the past week.  She has had generalized weakness, poor appetite, fatigue.  No fevers.  One episode of vomiting yesterday.  No diarrhea.  Patient had surgery on her right ear in February 27 for perforated tympanic membrane.  She was doing well prior to this.  She has been taking hydrocodone which she takes on a regular basis for her chronic pain.  She denies any pain with urination or blood in the urine.  She describes that she came in tonight because her "muscles were jumping" and she was not able to sleep.  She denies any dizziness or lightheadedness.  No chest pain or shortness of breath.  No cough, runny nose or sore throat.  She did not receive a flu shot.   The history is provided by the patient and a relative.  Weakness  Primary symptoms include no dizziness. Associated symptoms include vomiting. Pertinent negatives include no shortness of breath, no chest pain and no headaches.    Past Medical History:  Diagnosis Date  . Allergic rhinitis    maple pollen  . Anxiety   . Arthritis   . Bowel obstruction ([place])   . Chronic low back pain    Dr. Trenton Gammon.  Has had back injection--bp elevated after.  . DDD (degenerative disc disease), cervical    Hx of ACDF (Dr. [name]).  Followed by Dr. [name].  Also, Dr. [name] do left C7-T1 intralaminal epidural injection.  . Diverticulosis of colon   . DJD (degenerative joint disease)   . Gallstones   . GERD (gastroesophageal reflux disease)   . Herpes zoster 07/08/2014  . Hypercholesteremia    mild; pt declined statin trial 05/2014--needs recheck lipid panel at first f/u visit in 2017  . Hypertension   .  Osteopenia 07/2015   T score of -1.4 FRAX15%/2%  . Ovarian cyst 2017   GYN following:  f/u imaging 02/2016 showed stability.  Her GYN referred her to [place] for 2nd opinion 03/02/16.  Gyn onc felt surgery likely not necessary but gave pt option of elective resection---pt declined.  04/2017 f/u: pt symptomatic and to reconsider the option of surgical resection (robotic assisted BSO).  Pt more symptomatic late 2018 and plans on surgery with Dr. Denman George in summer 2019.  Marland Kitchen Perianal dermatitis    prn cutivate  . Phlebitis   . Right knee injury 2018   Patellofemoral crush injury--Dr. [name].  . Trochanteric bursitis of right hip    Recurrent (injection by Dr. [name] 05/26/15)  . 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 Benjamine Mola, will likely get hearing aid.  F/u 03/2017 w/Dr. Yates Decamp sign of infection, option of TM repair or amplification--pt to think abnout it.  As of 05/2017, plan is ossiculoplasty and tympanoplasty with Dr. May on 08/30/16 [facility]).  . Varicose vein    left leg     Patient Active Problem List   Diagnosis Date Noted  . Right ovarian cyst 08/22/2016  . Fibromyalgia 06/03/2015  . Cystocele 03/18/2014  . 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 Diverticulosis of colon without hemorrhage 06/12/2013  . Hypertension   . Osteopenia   .  Chronic low back pain 05/12/2011  . HYPERCHOLESTEROLEMIA 03/17/2008  . Anxiety state 09/17/2007  . GERD 09/17/2007  . DEGENERATIVE JOINT DISEASE 09/17/2007    Past Surgical History:  Procedure Laterality Date  . ABDOMINAL HYSTERECTOMY     TAH  . ANTERIOR AND POSTERIOR REPAIR N/A 03/18/2014   Procedure: Cystocele repair with graft, Vault suspension, Rectocele repair;  Surgeon: [name], MD;  Location: [facility]S;  Service: Urology;  Laterality: N/A;  . CHOLECYSTECTOMY    . CHOLECYSTECTOMY, LAPAROSCOPIC    . COLON SURGERY    . COLONOSCOPY  04/2006; 05/26/16   2007 (Dr. [name]): Normal.  04/2016 (Dr.  [name]) normal except diverticulosis and decreased anal sphincter tone.  No repeat colonoscopy is recommended due to age.  . cspine surgery     Dr. Wiliam Ke level ant cerv discectomy /fusion w/plating  . CYSTO N/A 03/18/2014   Procedure: CYSTO;  Surgeon: [name], MD;  Location: [facility];  Service: Urology;  Laterality: N/A;  . DEXA  07/30/2015   Osteopenia--repeat 2 yrs (Dr. Reino Kent)  . RECTOCELE REPAIR     with prolasped bledder repair  . RESECTION OF COLON     BENIGN TUMOR  . right hemicolectomy for diverticulitis with abscess  1993  . TUBAL LIGATION      OB History    Gravida Para Term Preterm AB Living   4 3     1 3    SAB TAB Ectopic Multiple Live Births                   Home Medications    Prior to Admission medications   Medication Sig Start Date End Date Taking? Authorizing Provider  ondansetron (ZOFRAN) 4 MG tablet Take 4 mg by mouth every 8 (eight) hours as needed for nausea or vomiting.   Yes [provider]  ondansetron (ZOFRAN-ODT) 4 MG disintegrating tablet Take 4 mg by mouth every 8 (eight) hours as needed for nausea or vomiting.   Yes [provider]  ALPRAZolam (XANAX) 0.5 MG tablet Take 1 tablet (0.5 mg total) by mouth 3 (three) times daily as needed for anxiety. 02/16/17   McGowenAdrian Blackwater, MD  aspirin 81 MG tablet Take 81 mg by mouth daily.    [provider]  Biotin 10000 MCG TABS Take 1 tablet by mouth daily.    [provider]  calcium carbonate (OSCAL) 1500 (600 Ca) MG TABS tablet Take 600 mg of elemental calcium by mouth daily with breakfast.    [provider]  CIPRODEX OTIC suspension Place 4 drops into the right ear 2 (two) times daily. 12/10/16   [provider]  estradiol (ESTRACE VAGINAL) 0.1 MG/GM vaginal cream Apply 1/4 applicator 3 times weekly vaginally. Patient taking differently: Apply 1/4 applicator 3 times weekly vaginally. As needed 08/05/14   Fontaine, Belinda Block, MD  fexofenadine  (ALLEGRA) 60 MG tablet Take 1 tablet (60 mg total) by mouth 2 (two) times daily. 07/12/17   McGowen, Adrian Blackwater, MD  fluticasone (CUTIVATE) 0.05 % cream Apply to affected area bid prn 08/08/16   McGowen, Adrian Blackwater, MD  fluticasone (FLONASE) 50 MCG/ACT nasal spray Place 2 sprays into both nostrils at bedtime. 05/17/16   McGowen, Adrian Blackwater, MD  HYDROcodone-acetaminophen (NORCO/VICODIN) 5-325 MG tablet 1 tab po tid prn pain 07/12/17   McGowen, Adrian Blackwater, MD  lisinopril (PRINIVIL,ZESTRIL) 10 MG tablet Take 1 tablet (10 mg total) by mouth daily. 07/12/17   McGowen, Adrian Blackwater, MD  meloxicam (MOBIC) 15 MG tablet TAKE 1 TABLET EVERY DAY WITH FOOD AS NEEDED FOR MUSCULOSKELETAL PAIN 03/27/17   McGowen, Adrian Blackwater, MD  Multiple Vitamin (MULTIVITAMIN) capsule Take 1 capsule by mouth daily.    [provider]  POTASSIUM PO Take by mouth.    [provider]  Red Yeast Rice 600 MG CAPS Take by mouth.    [provider]  verapamil (CALAN-SR) 180 MG CR tablet TAKE 1 TABLET AT BEDTIME NEEDS TO BE SEEN 06/08/17   McGowen, Adrian Blackwater, MD  vitamin B-12 (CYANOCOBALAMIN) 1000 MCG tablet Take 2,000 mcg by mouth daily.    [provider]  Vitamin D, Cholecalciferol, 1000 units CAPS Take 5,000 Units by mouth daily.    [provider]    Family History Family History  Problem Relation Age of Onset  . Hypertension Mother   . Diverticulitis Mother   . Breast cancer Sister 24  . Melanoma Brother   . Prostate cancer Brother   . Leukemia Brother   . Lupus Sister   . Lung disease Brother   . Stomach cancer Father   . Other Unknown        Family member with MGUS    Social History Social History   Tobacco Use  . Smoking status: Never Smoker  . Smokeless tobacco: Never Used  Substance Use Topics  . Alcohol use: No    Alcohol/week: 0.0 oz  . Drug use: No     Allergies   Gabapentin and Prednisone   Review of Systems Review of Systems  Constitutional: Positive for activity  change, appetite change and fatigue. Negative for fever.  HENT: Positive for ear pain. Negative for congestion and rhinorrhea.   Eyes: Negative for visual disturbance.  Respiratory: Negative for cough, chest tightness and shortness of breath.   Cardiovascular: Negative for chest pain and leg swelling.  Gastrointestinal: Positive for nausea and vomiting. Negative for abdominal pain.  Genitourinary: Negative for dysuria and hematuria.  Musculoskeletal: Positive for arthralgias and myalgias.  Skin: Negative for wound.  Neurological: Positive for weakness. Negative for dizziness and headaches.    all other systems are negative except as noted in the HPI and PMH.    Physical Exam Updated Vital Signs BP (!) 152/70 (BP Location: Right Arm)   Pulse 94   Temp 98.2 F (36.8 C) (Oral)   Resp 20   Ht 5' (1.524 m)   Wt 55.3 kg (122 lb)   LMP 06/27/1972   SpO2 97%   BMI 23.83 kg/m   Physical Exam  Constitutional: She is oriented to person, place, and time. She appears well-developed and well-nourished. No distress.  Anxious appearing  HENT:  Head: Normocephalic and atraumatic.  Mouth/Throat: Oropharynx is clear and moist. No oropharyngeal exudate.  Surgical dressing in place to R ear  Eyes: Conjunctivae and EOM are normal. Pupils are equal, round, and reactive to light.  Neck: Normal range of motion. Neck supple.  No meningismus.  Cardiovascular: Normal rate, regular rhythm, normal heart sounds and intact distal pulses. Exam reveals no gallop.  No murmur heard. Pulmonary/Chest: Effort normal and breath sounds normal. No respiratory distress.  Abdominal: Soft. There is no tenderness. There is no rebound and no guarding.  Musculoskeletal: Normal range of motion. She exhibits no edema or tenderness.  Neurological: She is alert and oriented to person, place, and time. No cranial nerve deficit. She exhibits normal muscle tone. Coordination normal.   5/5 strength throughout. CN 2-12  intact.Equal grip strength.  Mildly tremulous  Skin: Skin is warm. Capillary refill takes less than 2 seconds.  Psychiatric: She has a normal mood and affect. Her behavior is normal.  Nursing note and vitals reviewed.    ED Treatments / Results  Labs (all labs ordered are listed, but only abnormal results are displayed) Labs Reviewed  CBC WITH DIFFERENTIAL/PLATELET - Abnormal; Notable for the following components:      Result Value   HCT 34.2 (*)    All other components within normal limits  COMPREHENSIVE METABOLIC PANEL - Abnormal; Notable for the following components:   Sodium 119 (*)    Chloride 90 (*)    CO2 20 (*)    Glucose, Bld 120 (*)    Calcium 8.5 (*)    All other components within normal limits  URINALYSIS, ROUTINE W REFLEX MICROSCOPIC - Abnormal; Notable for the following components:   Color, Urine STRAW (*)    Leukocytes, UA SMALL (*)    All other components within normal limits  TROPONIN I  INFLUENZA PANEL BY PCR (TYPE A & B)  CK    EKG  EKG Interpretation None       Radiology No results found.  Procedures Procedures (including critical care time)  Medications Ordered in ED Medications  sodium chloride 0.9 % bolus 1,000 mL (not administered)     Initial Impression / Assessment and Plan / ED Course  I have reviewed the triage vital signs and the nursing notes.  Pertinent labs & imaging results that were available during my care of the patient were reviewed by me and considered in my medical decision making (see chart for details).     Patient with 1 week of generalized weakness, fatigue and, poor appetite.  Recent surgery on her ear.  No fever.  Patient appears well.  She does have positive orthostatics.  Will give IV hydration.  EKG unchanged left bundle branch block.  Urinalysis is negative.  Patient found to have profound hyponatremia of 119.  She is on any diuretics.  Suspect this is due to poor intake.  She will be observed in the  hospital for correction of this hyponatremia and hydration.  No evidence of infection on workup thus far.  Admission discussed with Dr. Darrick Meigs.  Final Clinical Impressions(s) / ED Diagnoses   Final diagnoses:  Hyponatremia  Orthostasis    ED Discharge Orders    None       Feather Berrie, Annie Main, MD 08/31/17 [phone]

## 2017-08-31 NOTE — ED Notes (Signed)
Date and time results received: 08/31/17 0519 (use smartphrase ".now" to insert current time)  Test: sodium Critical Value: 119  Name of Provider Notified: Dr Wyvonnia Dusky  Orders Received? Or Actions Taken?: Actions Taken: no orders received

## 2017-08-31 NOTE — ED Triage Notes (Signed)
Pt had surgery on her right ear Feb 27, has not been feeling well for several days, generalized weakness, vomiting, no diarrhea or fevers.  Pt has been taking hydrocodone after the surgery.

## 2017-09-01 LAB — BASIC METABOLIC PANEL
Anion gap: 8 (ref 5–15)
BUN: 14 mg/dL (ref 6–20)
CO2: 24 mmol/L (ref 22–32)
Calcium: 8.6 mg/dL — ABNORMAL LOW (ref 8.9–10.3)
Chloride: 99 mmol/L — ABNORMAL LOW (ref 101–111)
Creatinine, Ser: 0.91 mg/dL (ref 0.44–1.00)
GFR calc Af Amer: >60 mL/min (ref >60)
GFR calc non Af Amer: >60 mL/min (ref >60)
Glucose, Bld: 90 mg/dL (ref 65–99)
Potassium: 4.6 mmol/L (ref 3.5–5.1)
Sodium: 131 mmol/L — ABNORMAL LOW (ref 135–145)

## 2017-09-01 LAB — CBC
HCT: 35.6 % — ABNORMAL LOW (ref 36.0–46.0)
Hemoglobin: 12.1 g/dL (ref 12.0–15.0)
MCH: 30.3 pg (ref 26.0–34.0)
MCHC: 34 g/dL (ref 30.0–36.0)
MCV: 89 fL (ref 78.0–100.0)
Platelets: 174 10*3/uL (ref 150–400)
RBC: 4 MIL/uL (ref 3.87–5.11)
RDW: 12.8 % (ref 11.5–15.5)
WBC: 6.5 10*3/uL (ref 4.0–10.5)

## 2017-09-01 LAB — MAGNESIUM: Magnesium: 1.9 mg/dL (ref 1.7–2.4)

## 2017-09-01 MED ORDER — CEFUROXIME AXETIL 250 MG PO TABS: 250.0000 mg | ORAL_TABLET | Freq: Two times a day (BID) | ORAL | 0 refills | Status: AC

## 2017-09-01 NOTE — Discharge Instructions (Signed)
Follow with Primary MD  McGowen, Adrian Blackwater, MD  and other consultant's as instructed your Hospitalist MD  Please get a complete blood count and chemistry panel checked by your Primary MD at your next visit, and again as instructed by your Primary MD.  Get Medicines reviewed and adjusted: Please take all your medications with you for your next visit with your Primary MD  Laboratory/radiological data: Please request your Primary MD to go over all hospital tests and procedure/radiological results at the follow up, please ask your Primary MD to get all Hospital records sent to his/her office.  In some cases, they will be blood work, cultures and biopsy results pending at the time of your discharge. Please request that your primary care M.D. follows up on these results.  Also Note the following: If you experience worsening of your admission symptoms, develop shortness of breath, life threatening emergency, suicidal or homicidal thoughts you must seek medical attention immediately by calling 911 or calling your MD immediately  if symptoms less severe.  You must read complete instructions/literature along with all the possible adverse reactions/side effects for all the Medicines you take and that have been prescribed to you. Take any new Medicines after you have completely understood and accpet all the possible adverse reactions/side effects.   Do not drive when taking Pain medications or sleeping medications (Benzodaizepines)  Do not take more than prescribed Pain, Sleep and Anxiety Medications. It is not advisable to combine anxiety,sleep and pain medications without talking with your primary care practitioner  Special Instructions: If you have smoked or chewed Tobacco  in the last 2 yrs please stop smoking, stop any regular Alcohol  and or any Recreational drug use.  Wear Seat belts while driving.  Please note: You were cared for by a hospitalist during your hospital stay. Once you are  discharged, your primary care physician will handle any further medical issues. Please note that NO REFILLS for any discharge medications will be authorized once you are discharged, as it is imperative that you return to your primary care physician (or establish a relationship with a primary care physician if you do not have one) for your post hospital discharge needs so that they can reassess your need for medications and monitor your lab values.      Hyponatremia Hyponatremia is when the amount of salt (sodium) in your blood is too low. When salt levels are low, your cells absorb extra water and they swell. The swelling happens throughout the body, but it mostly affects the brain. Follow these instructions at home:  Take medicines only as told by your doctor. Many medicines can make this condition worse. Talk with your doctor about any medicines that you are currently taking.  Carefully follow a recommended diet as told by your doctor.  Carefully follow instructions from your doctor about fluid restrictions.  Keep all follow-up visits as told by your doctor. This is important.  Do not drink alcohol. Contact a doctor if:  You feel sicker to your stomach (nauseous).  You feel more confused.  You feel more tired (fatigued).  Your headache gets worse.  You feel weaker.  Your symptoms go away and then they come back.  You have trouble following the diet instructions. Get help right away if:  You start to twitch and shake (have a seizure).  You pass out (faint).  You keep having watery poop (diarrhea).  You keep throwing up (vomiting). This information is not intended to replace advice given to  you by your health care provider. Make sure you discuss any questions you have with your health care provider. Document Released: 02/23/2011 Document Revised: 11/19/2015 Document Reviewed: 06/09/2014 Elsevier Interactive Patient Education  2018 [employer].   Hypotension As your  heart beats, it forces blood through your body. This force is called blood pressure. If you have hypotension, you have low blood pressure. When your blood pressure is too low, you may not get enough blood to your brain. You may feel weak, feel light-headed, have a fast heartbeat, or even pass out (faint). Follow these instructions at home: Eating and drinking  Drink enough fluids to keep your pee (urine) clear or pale yellow.  Eat a healthy diet, and follow instructions from your doctor about eating or drinking restrictions. A healthy diet includes: ? Fresh fruits and vegetables. ? Whole grains. ? Low-fat (lean) meats. ? Low-fat dairy products.  Eat extra salt only as told. Do not add extra salt to your diet unless your doctor tells you to.  Eat small meals often.  Avoid standing up quickly after you eat. Medicines  Take over-the-counter and prescription medicines only as told by your doctor. ? Follow instructions from your doctor about changing how much you take (the dosage) of your medicines, if this applies. ? Do not stop or change your medicine on your own. General instructions  Wear compression stockings as told by your doctor.  Get up slowly from lying down or sitting.  Avoid hot showers and a lot of heat as told by your doctor.  Return to your normal activities as told by your doctor. Ask what activities are safe for you.  Do not use any products that contain nicotine or tobacco, such as cigarettes and e-cigarettes. If you need help quitting, ask your doctor.  Keep all follow-up visits as told by your doctor. This is important. Contact a doctor if:  You throw up (vomit).  You have watery poop (diarrhea).  You have a fever for more than 2-3 days.  You feel more thirsty than normal.  You feel weak and tired. Get help right away if:  You have chest pain.  You have a fast or irregular heartbeat.  You lose feeling (get numbness) in any part of your body.  You  cannot move your arms or your legs.  You have trouble talking.  You get sweaty or feel light-headed.  You faint.  You have trouble breathing.  You have trouble staying awake.  You feel confused. This information is not intended to replace advice given to you by your health care provider. Make sure you discuss any questions you have with your health care provider. Document Released: 09/07/2009 Document Revised: 03/01/2016 Document Reviewed: 03/01/2016 Elsevier Interactive Patient Education  2017 [employer].

## 2017-09-01 NOTE — Discharge Summary (Signed)
Physician Discharge Summary  Alison Cuevas JJH:[id number] DOB: 1946/04/15 DOA: 08/31/2017  PCP: [name], MD  Admit date: 08/31/2017 Discharge date: 09/01/2017  Admitted From: Home  Disposition: Home   Recommendations for Outpatient Follow-up:  1. Follow up with PCP in 1 weeks 2. Please obtain BMP/CBC in one week 3. Please follow up on the following pending results: Final Culture Data  Discharge Condition: Home   CODE STATUS: FULL    Brief Hospitalization Summary: Please see all hospital notes, images, labs for full details of the hospitalization.  HPI: Alison Cuevas is a 72 y.o. female who recently had surgery for a ruptured right tympanic membrane presented to the ED with generalized weakness and malaise.  She also reports frequent urination.  Patient presents with "feeling dehydrated". States she has not felt well for the past week. She has had generalized weakness, poor appetite, fatigue. No fevers.   She denies chest pain and shortness of breath.  She endorses one episode of vomiting yesterday. No diarrhea. Patient had surgery on her right ear in February 27 for perforated tympanic membrane performed by Dr. May. She was doing well prior to this. She is been taking hydrocodone which she takes on a regular basis for her chronic pain. She denies any pain with urination or blood in the urine. She describes that she came in tonight because her "muscles were jumping" and she was not able to sleep. She denies any dizziness or lightheadedness. No chest pain or shortness of breath. No cough, runny nose or sore throat. She did not receive a flu shot.  Her primary symptoms includeno dizziness. Associated symptoms includevomiting. Pertinent negatives includeno shortness of breath,no chest painand no headaches.  ED course: The patient was evaluated in the emergency department and noted to have a UTI with white blood cells noted in the urine.  The patient also was noted to have  clinical signs of dehydration with dry mucous membranes and noted to be hyponatremic with a sodium of 119.  She was treated with IV fluid normal saline and admission was requested for further evaluation and management.  1. Hyponatremia-Much improved with IVF hydration.  Na is now 131 and patient feeling much better now and asking to go home, suspect this was secondary to dehydration given her recent surgery and poor oral intake.  She was noted to have a normal sodium value in January of this year.  She was treated with IV fluid hydration with normal saline.  Will monitor serial BMP testing.  Consider nephrology evaluation if improvement.  The patient is adamant that she does not have cancer in her ovarian mass per recent follow-up with gynecology oncology.  2. UTI-treated with IV ceftriaxone and recommend to follow urine culture testing. Discharge on ceftin x 5 days.  Follow up with PCP.  3. Generalized Weakness-secondary to hyponatremia and dehydration.  Fall precautions.  4. Moderate Dehydration-treated with IV fluid hydration. 5. Right ovarian Mass-the patient reports that she is followed by gynecology/oncology and is scheduled to have the mass removed in the summer.  This is noted to be a benign mass and has been slow growing.  Follow up with Dr. Denman George.  6. S/p right tympanic membrane rupture repair-the patient is followed closely by Dr. Lu Duffel and will follow up after discharge. Follow up with Dr. Lu Duffel as scheduled.  7. Essential Hypertension- monitor and resume home medications as appropriate. 8. GERD-stable, will order Protonix for GI protection. 9. Chronic low back pain-resume her home hydrocodone tablets as needed  for pain symptoms. 10. Osteopenia-stable.  DVT Prophylaxis: lovenox  Code Status: Full   Family Communication:   Disposition Plan: Home   Discharge Diagnoses:  Principal Problem:   Hyponatremia Active Problems:   GERD   Chronic low back pain   Hypertension   Osteopenia    Right ovarian cyst   Generalized weakness   Dehydration   Tympanic membrane rupture, right   UTI (urinary tract infection)  Discharge Instructions: Discharge Instructions    Increase activity slowly   Complete by:  As directed      Allergies as of 09/01/2017      Reactions   Gabapentin Other (See Comments)   Elevated heart rate and crazy dreams   Prednisone    REACTION: funny feeling      Medication List    TAKE these medications   ALPRAZolam 0.5 MG tablet Commonly known as:  XANAX Take 1 tablet (0.5 mg total) by mouth 3 (three) times daily as needed for anxiety.   aspirin 81 MG tablet Take 81 mg by mouth daily.   Biotin 10000 MCG Tabs Take 1 tablet by mouth daily.   calcium carbonate 1500 (600 Ca) MG Tabs tablet Commonly known as:  OSCAL Take 600 mg of elemental calcium by mouth daily with breakfast.   cefUROXime 250 MG tablet Commonly known as:  CEFTIN Take 1 tablet (250 mg total) by mouth 2 (two) times daily with a meal for 5 days.   CIPRODEX OTIC suspension Generic drug:  ciprofloxacin-dexamethasone Place 4 drops into the right ear 2 (two) times daily.   CULTURELLE DIGESTIVE HEALTH PO Take 1 tablet by mouth daily as needed. GI upset   estradiol 0.1 MG/GM vaginal cream Commonly known as:  ESTRACE VAGINAL Apply 1/4 applicator 3 times weekly vaginally. What changed:  additional instructions   fexofenadine 60 MG tablet Commonly known as:  ALLEGRA Take 1 tablet (60 mg total) by mouth 2 (two) times daily.   fluticasone 0.05 % cream Commonly known as:  CUTIVATE Apply to affected area bid prn   fluticasone 50 MCG/ACT nasal spray Commonly known as:  FLONASE Place 2 sprays into both nostrils at bedtime.   HYDROcodone-acetaminophen 5-325 MG tablet Commonly known as:  NORCO/VICODIN 1 tab po tid prn pain   lisinopril 10 MG tablet Commonly known as:  PRINIVIL,ZESTRIL Take 1 tablet (10 mg total) by mouth daily.   meloxicam 15 MG tablet Commonly known as:   MOBIC TAKE 1 TABLET EVERY DAY WITH FOOD AS NEEDED FOR MUSCULOSKELETAL PAIN   multivitamin capsule Take 1 capsule by mouth daily.   ofloxacin 0.3 % OTIC solution Commonly known as:  FLOXIN Place 5 drops into both ears 2 (two) times daily. For up to 7 days for ear pain or drainage.   ondansetron 4 MG tablet Commonly known as:  ZOFRAN Take 4 mg by mouth every 8 (eight) hours as needed for nausea or vomiting.   POTASSIUM PO Take 1 tablet by mouth daily.   Red Yeast Rice 600 MG Caps Take 1 capsule by mouth daily.   verapamil 180 MG CR tablet Commonly known as:  CALAN-SR TAKE 1 TABLET AT BEDTIME NEEDS TO BE SEEN   vitamin B-12 1000 MCG tablet Commonly known as:  CYANOCOBALAMIN Take 2,000 mcg by mouth daily.   Vitamin D (Cholecalciferol) 1000 units Caps Take 5,000 Units by mouth daily.      Follow-up Information    McGowen, Adrian Blackwater, MD. Schedule an appointment as soon as possible for a visit  in 1 week(s).   Specialty:  Family Medicine Why:  Hospital Follow Up, recheck [facility] Contact information: [address] [place] [address] [phone]          Allergies  Allergen Reactions  . Gabapentin Other (See Comments)    Elevated heart rate and crazy dreams  . Prednisone     REACTION: funny feeling   Allergies as of 09/01/2017      Reactions   Gabapentin Other (See Comments)   Elevated heart rate and crazy dreams   Prednisone    REACTION: funny feeling      Medication List    TAKE these medications   ALPRAZolam 0.5 MG tablet Commonly known as:  XANAX Take 1 tablet (0.5 mg total) by mouth 3 (three) times daily as needed for anxiety.   aspirin 81 MG tablet Take 81 mg by mouth daily.   Biotin 10000 MCG Tabs Take 1 tablet by mouth daily.   calcium carbonate 1500 (600 Ca) MG Tabs tablet Commonly known as:  OSCAL Take 600 mg of elemental calcium by mouth daily with breakfast.   cefUROXime 250 MG tablet Commonly known as:  CEFTIN Take 1 tablet (250  mg total) by mouth 2 (two) times daily with a meal for 5 days.   CIPRODEX OTIC suspension Generic drug:  ciprofloxacin-dexamethasone Place 4 drops into the right ear 2 (two) times daily.   CULTURELLE DIGESTIVE HEALTH PO Take 1 tablet by mouth daily as needed. GI upset   estradiol 0.1 MG/GM vaginal cream Commonly known as:  ESTRACE VAGINAL Apply 1/4 applicator 3 times weekly vaginally. What changed:  additional instructions   fexofenadine 60 MG tablet Commonly known as:  ALLEGRA Take 1 tablet (60 mg total) by mouth 2 (two) times daily.   fluticasone 0.05 % cream Commonly known as:  CUTIVATE Apply to affected area bid prn   fluticasone 50 MCG/ACT nasal spray Commonly known as:  FLONASE Place 2 sprays into both nostrils at bedtime.   HYDROcodone-acetaminophen 5-325 MG tablet Commonly known as:  NORCO/VICODIN 1 tab po tid prn pain   lisinopril 10 MG tablet Commonly known as:  PRINIVIL,ZESTRIL Take 1 tablet (10 mg total) by mouth daily.   meloxicam 15 MG tablet Commonly known as:  MOBIC TAKE 1 TABLET EVERY DAY WITH FOOD AS NEEDED FOR MUSCULOSKELETAL PAIN   multivitamin capsule Take 1 capsule by mouth daily.   ofloxacin 0.3 % OTIC solution Commonly known as:  FLOXIN Place 5 drops into both ears 2 (two) times daily. For up to 7 days for ear pain or drainage.   ondansetron 4 MG tablet Commonly known as:  ZOFRAN Take 4 mg by mouth every 8 (eight) hours as needed for nausea or vomiting.   POTASSIUM PO Take 1 tablet by mouth daily.   Red Yeast Rice 600 MG Caps Take 1 capsule by mouth daily.   verapamil 180 MG CR tablet Commonly known as:  CALAN-SR TAKE 1 TABLET AT BEDTIME NEEDS TO BE SEEN   vitamin B-12 1000 MCG tablet Commonly known as:  CYANOCOBALAMIN Take 2,000 mcg by mouth daily.   Vitamin D (Cholecalciferol) 1000 units Caps Take 5,000 Units by mouth daily.       Procedures/Studies:  No results found.   Subjective: Pt says she feels much better, she  is eating and drinking well and wants to go home.    Discharge Exam: Vitals:   08/31/17 2300 09/01/17 0500  BP: 118/78 120/79  Pulse: 80 80  Resp: 16 16  Temp: 98.2 F (36.8 C) 98.2 F (36.8 C)  SpO2: 96% 98%   Vitals:   08/31/17 1757 08/31/17 2300 09/01/17 0500 09/01/17 0600  BP: 140/65 118/78 120/79   Pulse:  80 80   Resp:  16 16   Temp:  98.2 F (36.8 C) 98.2 F (36.8 C)   TempSrc:  Oral Oral   SpO2:  96% 98%   Weight:    55.9 kg (123 lb 3.8 oz)  Height:        General: Pt is alert, awake, not in acute distress Cardiovascular: RRR, S1/S2 +, no rubs, no gallops Respiratory: CTA bilaterally, no wheezing, no rhonchi Abdominal: Soft, NT, ND, bowel sounds + Extremities: no edema, no cyanosis   The results of significant diagnostics from this hospitalization (including imaging, microbiology, ancillary and laboratory) are listed below for reference.     Microbiology: No results found for this or any previous visit (from the past 240 hour(s)).   Labs: BNP (last 3 results) Recent Labs    08/31/17 1353  BNP 408.1*   Basic Metabolic Panel: Recent Labs  Lab 08/31/17 0413 09/01/17 0506  NA 119* 131*  K 4.3 4.6  CL 90* 99*  CO2 20* 24  GLUCOSE 120* 90  BUN 18 14  CREATININE 0.86 0.91  CALCIUM 8.5* 8.6*  MG  --  1.9   Liver Function Tests: Recent Labs  Lab 08/31/17 0413  AST 25  ALT 22  ALKPHOS 51  BILITOT 0.9  PROT 6.7  ALBUMIN 3.9   No results for input(s): LIPASE, AMYLASE in the last 168 hours. No results for input(s): AMMONIA in the last 168 hours. CBC: Recent Labs  Lab 08/31/17 0413 09/01/17 0506  WBC 6.1 6.5  NEUTROABS 3.2  --   HGB 12.2 12.1  HCT 34.2* 35.6*  MCV 85.7 89.0  PLT 189 174   Cardiac Enzymes: Recent Labs  Lab 08/31/17 0413  CKTOTAL 102  TROPONINI <0.03   BNP: Invalid input(s): POCBNP CBG: No results for input(s): GLUCAP in the last 168 hours. D-Dimer No results for input(s): DDIMER in the last 72 hours. Hgb  A1c No results for input(s): HGBA1C in the last 72 hours. Lipid Profile No results for input(s): CHOL, HDL, LDLCALC, TRIG, CHOLHDL, LDLDIRECT in the last 72 hours. Thyroid function studies Recent Labs    08/31/17 1353  TSH 0.790   Anemia work up No results for input(s): VITAMINB12, FOLATE, FERRITIN, TIBC, IRON, RETICCTPCT in the last 72 hours. Urinalysis    Component Value Date/Time   COLORURINE STRAW (A) 08/31/2017 0439   APPEARANCEUR CLEAR 08/31/2017 0439   LABSPEC 1.006 08/31/2017 0439   PHURINE 6.0 08/31/2017 0439   GLUCOSEU NEGATIVE 08/31/2017 0439   GLUCOSEU NEGATIVE 12/09/2013 1440   HGBUR NEGATIVE 08/31/2017 0439   BILIRUBINUR NEGATIVE 08/31/2017 0439   BILIRUBINUR Neg 12/10/2013 1407   KETONESUR NEGATIVE 08/31/2017 0439   PROTEINUR NEGATIVE 08/31/2017 0439   UROBILINOGEN 0.2 07/30/2014 1528   NITRITE NEGATIVE 08/31/2017 0439   LEUKOCYTESUR SMALL (A) 08/31/2017 0439   Sepsis Labs Invalid input(s): PROCALCITONIN,  WBC,  LACTICIDVEN Microbiology No results found for this or any previous visit (from the past 240 hour(s)).  Time coordinating discharge:   SIGNED:  Irwin Brakeman, MD  [facility] 09/01/2017, 11:43 AM Pager [phone]  If 7PM-7AM, please contact night-coverage [email or web address] Password [email or web address]

## 2017-09-01 NOTE — Evaluation (Signed)
Physical Therapy Evaluation Patient Details Name: Alison Cuevas MRN: [id number] DOB: [birth year] Today's Date: 09/01/2017   History of Present Illness  Alison Cuevas is a 72 y.o. female who recently had surgery for a ruptured right tympanic membrane presented to the ED with generalized weakness and malaise.  She also reports frequent urination.  Patient presents with "feeling dehydrated".  States she has not felt well for the past week.  She has had generalized weakness, poor appetite, fatigue.  No fevers.    She denies chest pain and shortness of breath.  She endorses one episode of vomiting yesterday.  No diarrhea.  Patient had surgery on her right ear in February 27 for perforated tympanic membrane performed by Dr. May.  She was doing well prior to this.  She is been taking hydrocodone which she takes on a regular basis for her chronic pain.  She denies any pain with urination or blood in the urine.  She describes that she came in tonight because her "muscles were jumping" and she was not able to sleep.  She denies any dizziness or lightheadedness.  No chest pain or shortness of breath.  No cough, runny nose or sore throat.  She did not receive a flu shot.  Her primary symptoms include no dizziness. Associated symptoms include vomiting. Pertinent negatives include no shortness of breath, no chest pain and no headaches.     Clinical Impression  Patient functioning at baseline for functional mobility and gait.  PLAN: Patient discharged from physical therapy to care of nursing for ambulation daily as tolerated for length of stay.    Follow Up Recommendations No PT follow up    Equipment Recommendations  None recommended by PT    Recommendations for Other Services       Precautions / Restrictions Precautions Precautions: None Restrictions Weight Bearing Restrictions: No      Mobility  Bed Mobility Overal bed mobility: Independent                Transfers Overall transfer level:  Independent                  Ambulation/Gait Ambulation/Gait assistance: Independent Ambulation Distance (Feet): 150 Feet Assistive device: None Gait Pattern/deviations: WFL(Within Functional Limits)   Gait velocity interpretation: at or above normal speed for age/gender    Stairs Stairs: Yes Stairs assistance: Modified independent (Device/Increase time) Stair Management: One rail Right;Alternating pattern Number of Stairs: 4 General stair comments: demonstrates good return for going up/down steps using 1 siderail without loss of balance  Wheelchair Mobility    Modified Rankin (Stroke Patients Only)       Balance Overall balance assessment: No apparent balance deficits (not formally assessed)                                           Pertinent Vitals/Pain Pain Assessment: No/denies pain    Home Living Family/patient expects to be discharged to:: Private residence Living Arrangements: Spouse/significant other(her daughter lives 8 miles away) Available Help at Discharge: Family Type of Home: House Home Access: Stairs to enter Entrance Stairs-Rails: Right;Left;Can reach both Technical brewer of Steps: 2 Home Layout: Two level Home Equipment: Environmental consultant - 2 wheels;Cane - single point;Shower seat;Bedside commode      Prior Function Level of Independence: Independent               Hand Dominance  Extremity/Trunk Assessment   Upper Extremity Assessment Upper Extremity Assessment: Overall WFL for tasks assessed    Lower Extremity Assessment Lower Extremity Assessment: Overall WFL for tasks assessed    Cervical / Trunk Assessment Cervical / Trunk Assessment: Normal  Communication   Communication: No difficulties  Cognition Arousal/Alertness: Awake/alert Behavior During Therapy: WFL for tasks assessed/performed Overall Cognitive Status: Within Functional Limits for tasks assessed                                         General Comments      Exercises     Assessment/Plan    PT Assessment Patent does not need any further PT services  PT Problem List         PT Treatment Interventions      PT Goals (Current goals can be found in the Care Plan section)  Acute Rehab PT Goals Patient Stated Goal: return home PT Goal Formulation: With patient Time For Goal Achievement: 09-08-17 Potential to Achieve Goals: Good    Frequency     Barriers to discharge        Co-evaluation               AM-PAC PT "6 Clicks" Daily Activity  Outcome Measure Difficulty turning over in bed (including adjusting bedclothes, sheets and blankets)?: None Difficulty moving from lying on back to sitting on the side of the bed? : None Difficulty sitting down on and standing up from a chair with arms (e.g., wheelchair, bedside commode, etc,.)?: None Help needed moving to and from a bed to chair (including a wheelchair)?: None Help needed walking in hospital room?: None Help needed climbing 3-5 steps with a railing? : None 6 Click Score: 24    End of Session   Activity Tolerance: Patient tolerated treatment well Patient left: in bed Nurse Communication: Mobility status PT Visit Diagnosis: Unsteadiness on feet (R26.81);Other abnormalities of gait and mobility (R26.89);Muscle weakness (generalized) (M62.81)    Time: 5053-9767 PT Time Calculation (min) (ACUTE ONLY): 14 min   Charges:   PT Evaluation $PT Eval Low Complexity: 1 Low PT Treatments $Gait Training: 8-22 mins   PT G Codes:        12:14 PM, 2017/09/08 Lonell Grandchild, MPT Physical Therapist with [facility] 336 [phone] office [phone] mobile phone

## 2017-09-01 NOTE — Progress Notes (Signed)
IV discontinued catheter intact. Patient discharged with instructions given on medications and follow up visits,patient verbalized understanding .Prescriptions sent to Pharmacy of choice documented on AVS.Accompanied by staff to an awaiting vehicle.

## 2017-09-04 ENCOUNTER — Telehealth: Payer: Self-pay

## 2017-09-04 DIAGNOSIS — H60311 Diffuse otitis externa, right ear: Secondary | ICD-10-CM | POA: Diagnosis not present

## 2017-09-04 DIAGNOSIS — H7291 Unspecified perforation of tympanic membrane, right ear: Secondary | ICD-10-CM | POA: Diagnosis not present

## 2017-09-04 NOTE — Telephone Encounter (Signed)
Spoke with spouse, Alison Cuevas.   Transition Care Management Follow-up Telephone Call  Admit date: 08/31/2017 Discharge date: 09/01/2017 Dx: Hyponatremia    How have you been since you were released from the hospital? "She's doing good"   Do you understand why you were in the hospital? yes, "dehyrated and low salt"   Do you understand the discharge instructions? yes   Where were you discharged to? Home. Lives with husband.    Items Reviewed:  Medications reviewed: no. Spouse states he does not know her medications. Advised to bring to appt.   Allergies reviewed: no  Dietary changes reviewed: yes  Referrals reviewed: yes   Functional Questionnaire:   Activities of Daily Living (ADLs):   She states they are independent in the following: ambulation, bathing and hygiene, feeding, continence, grooming, toileting and dressing States they require assistance with the following: None.    Any transportation issues/concerns?: no   Any patient concerns? no   Confirmed importance and date/time of follow-up visits scheduled yes  Provider Appointment booked with PCP 09/11/17.   Confirmed with patient if condition begins to worsen call PCP or go to the ER.  Patient was given the office number and encouraged to call back with question or concerns.  : yes

## 2017-09-05 NOTE — Telephone Encounter (Signed)
Noted.  Nurse TCM phone contact with pt 09/04/17.

## 2017-09-08 ENCOUNTER — Ambulatory Visit (INDEPENDENT_AMBULATORY_CARE_PROVIDER_SITE_OTHER): Payer: Medicare Other | Admitting: Gynecology

## 2017-09-08 ENCOUNTER — Encounter: Payer: Self-pay | Admitting: Gynecology

## 2017-09-08 VITALS — BP 118/76

## 2017-09-08 DIAGNOSIS — N949 Unspecified condition associated with female genital organs and menstrual cycle: Secondary | ICD-10-CM | POA: Diagnosis not present

## 2017-09-08 DIAGNOSIS — N83209 Unspecified ovarian cyst, unspecified side: Secondary | ICD-10-CM | POA: Diagnosis not present

## 2017-09-08 DIAGNOSIS — R300 Dysuria: Secondary | ICD-10-CM | POA: Diagnosis not present

## 2017-09-08 LAB — WET PREP FOR TRICH, YEAST, CLUE

## 2017-09-08 MED ORDER — TERCONAZOLE 0.4 % VA CREA: 1.0000 | TOPICAL_CREAM | Freq: Every day | VAGINAL | 0 refills | Status: DC

## 2017-09-08 NOTE — Progress Notes (Signed)
    Alison Cuevas [birth year] [id number]        72 y.o.  [id number] presents complaining of vaginal burning over the last several days.  Notes she has been on a fair amount of antibiotics from other physicians.  No discharge itching or odor.  No urinary symptoms such as frequency dysuria urgency low back pain fever or chills.  No diarrhea or constipation.  She does have a prescription for vaginal estrogen but admits to not using on a regular basis.  She is being followed by Dr. Denman George for a persistent pelvic cyst and has a ultrasound scheduled and tentatively we are planning on surgical removal this coming summer.  Past medical history,surgical history, problem list, medications, allergies, family history and social history were all reviewed and documented in the EPIC chart.  Directed ROS with pertinent positives and negatives documented in the history of present illness/assessment and plan.  Exam: Caryn Bee assistant Vitals:   09/08/17 1450  BP: 118/76   General appearance:  Normal Abdomen soft nontender without masses guarding rebound Pelvic external BUS vagina with atrophic changes.  Bimanual with pelvic fullness consistent with history of cyst.  Assessment/Plan:  Alison Cuevas with:  1. Vulvar/vaginal burning.  Only started over the last several days.  We discussed vaginal atrophy and the need to use her vaginal estrogen cream on a more consistent basis.  I think this more acute burning is secondary to probable yeast.  Her wet prep is negative but given the recent antibiotic use and the more acute onset of the symptoms I am going to treat her with Terazol 7 day cream nightly x7 nights.  Patient will follow-up if her symptoms persist, worsen or recur. 2. Persistent pelvic cyst.  Patient will continue to follow-up with Dr. Denman George in reference to management for this.    [name] MD, 2:59 PM 09/08/2017

## 2017-09-08 NOTE — Patient Instructions (Signed)
Use the vaginal cream daily for 7 days.  Follow-up if the vaginal burning continues.

## 2017-09-08 NOTE — Addendum Note (Signed)
Addended by: Nelva Nay on: 09/08/2017 03:32 PM   Modules accepted: Orders

## 2017-09-09 LAB — URINALYSIS, COMPLETE W/RFL CULTURE
Bacteria, UA: NONE SEEN /HPF
Bilirubin Urine: NEGATIVE
Glucose, UA: NEGATIVE
Hgb urine dipstick: NEGATIVE
Hyaline Cast: NONE SEEN /LPF
Ketones, ur: NEGATIVE
Leukocyte Esterase: NEGATIVE
Nitrites, Initial: NEGATIVE
Protein, ur: NEGATIVE
RBC / HPF: NONE SEEN /HPF (ref 0–2)
Specific Gravity, Urine: 1.015 (ref 1.001–1.03)
WBC, UA: NONE SEEN /HPF (ref 0–5)
pH: 7 (ref 5.0–8.0)

## 2017-09-09 LAB — NO CULTURE INDICATED

## 2017-09-11 ENCOUNTER — Ambulatory Visit (INDEPENDENT_AMBULATORY_CARE_PROVIDER_SITE_OTHER): Payer: Medicare Other | Admitting: Family Medicine

## 2017-09-11 ENCOUNTER — Encounter: Payer: Self-pay | Admitting: Family Medicine

## 2017-09-11 VITALS — BP 126/69 | HR 77 | Temp 98.0°F | Resp 16 | Wt 123.0 lb

## 2017-09-11 DIAGNOSIS — N3001 Acute cystitis with hematuria: Secondary | ICD-10-CM | POA: Diagnosis not present

## 2017-09-11 DIAGNOSIS — I10 Essential (primary) hypertension: Secondary | ICD-10-CM | POA: Diagnosis not present

## 2017-09-11 DIAGNOSIS — E860 Dehydration: Secondary | ICD-10-CM | POA: Diagnosis not present

## 2017-09-11 DIAGNOSIS — E871 Hypo-osmolality and hyponatremia: Secondary | ICD-10-CM | POA: Diagnosis not present

## 2017-09-11 LAB — BASIC METABOLIC PANEL
BUN: 8 mg/dL (ref 6–23)
CO2: 27 mEq/L (ref 19–32)
Calcium: 9.4 mg/dL (ref 8.4–10.5)
Chloride: 94 mEq/L — ABNORMAL LOW (ref 96–112)
Creatinine, Ser: 0.75 mg/dL (ref 0.40–1.20)
GFR: 80.52 mL/min (ref >60.00)
Glucose, Bld: 120 mg/dL — ABNORMAL HIGH (ref 70–99)
Potassium: 4.3 mEq/L (ref 3.5–5.1)
Sodium: 129 mEq/L — ABNORMAL LOW (ref 135–145)

## 2017-09-11 NOTE — Progress Notes (Signed)
09/11/2017  CC:  Chief Complaint  Patient presents with  . Hospitalization Follow-up    Patient is a 72 y.o. Caucasian female who presents for  hospital follow up, specifically [facility] face-to-face visit. Dates hospitalized: 3/7-3/8, 2019. Days since d/c from hospital: 10 Patient was discharged from hospital to home. Reason for admission to hospital: acute dehydration with hyponatremia, also UTI. Date of interactive (phone) contact with patient and/or caregiver:09/04/17.  I have reviewed patient's discharge summary plus pertinent specific notes, labs, and imaging from the hospitalization.   Pt was treated with IVF, ceftriaxone.  I cannot find evidence that a urine culture was done. Was d/c'd home on ceftin x 5d.  She denies having any UTI sx's at the time of her illness. Her GYN MD checked her urine 3 d/a and pt states it was normal.  Improved, but feeling tired.  Has been through a lot; husband's health declining, her own TM/surgery problems (got TM surg herself on 08/23/17).  Has plan for resection of pelvic mass this summer (taking out both ovaries). When asked about depression, she denies feeling depressed.  Just says she is ready to get past a lot of her current issues. She states that her anxiety is controlled fine, only uses xanax hs.  Medication reconciliation was done today and patient is taking meds as recommended by discharging hospitalist/specialist.    Says home bp's 130s-150s/70s at home. Hydrating well with water, pedialyte, boost. Says R ear feels full today.  PMH:  Past Medical History:  Diagnosis Date  . Allergic rhinitis    maple pollen  . Anxiety   . Arthritis   . Bowel obstruction ([place])   . Chronic low back pain    Dr. Trenton Gammon.  Has had back injection--bp elevated after.  . DDD (degenerative disc disease), cervical    Hx of ACDF (Dr. [name]).  Followed by Dr. [name].  Also, Dr. [name] do left C7-T1 intralaminal  epidural injection.  . Diverticulosis of colon   . DJD (degenerative joint disease)   . Gallstones   . GERD (gastroesophageal reflux disease)   . Herpes zoster 07/08/2014  . Hypercholesteremia    mild; pt declined statin trial 05/2014--needs recheck lipid panel at first f/u visit in 2017  . Hypertension   . Osteopenia 07/2015   T score of -1.4 FRAX15%/2%  . Ovarian cyst 2017   GYN following:  f/u imaging 02/2016 showed stability.  Her GYN referred her to [place] for 2nd opinion 03/02/16.  Gyn onc felt surgery likely not necessary but gave pt option of elective resection---pt declined.  04/2017 f/u: pt symptomatic and to reconsider the option of surgical resection (robotic assisted BSO).  Pt more symptomatic late 2018 and plans on surgery with Dr. Denman George in summer 2019.  Marland Kitchen Perianal dermatitis    prn cutivate  . Phlebitis   . Right knee injury 2018   Patellofemoral crush injury--Dr. [name].  . Trochanteric bursitis of right hip    Recurrent (injection by Dr. [name] 05/26/15)  . 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 Benjamine Mola, will likely get hearing aid.  F/u 03/2017 w/Dr. Yates Decamp sign of infection, option of TM repair or amplification--pt to think abnout it.  As of 05/2017, plan is ossiculoplasty and tympanoplasty with Dr. May on 08/30/16 [facility]).  . Varicose vein    left leg     PSH:  Past Surgical History:  Procedure Laterality Date  . ABDOMINAL HYSTERECTOMY  TAH  . ANTERIOR AND POSTERIOR REPAIR N/A 03/18/2014   Procedure: Cystocele repair with graft, Vault suspension, Rectocele repair;  Surgeon: [name], MD;  Location: [facility];  Service: Urology;  Laterality: N/A;  . CHOLECYSTECTOMY    . CHOLECYSTECTOMY, LAPAROSCOPIC    . COLON SURGERY    . COLONOSCOPY  04/2006; 05/26/16   2007 (Dr. [name]): Normal.  04/2016 (Dr. [name]) normal except diverticulosis and decreased anal sphincter tone.  No repeat colonoscopy is recommended due  to age.  . cspine surgery     Dr. Wiliam Ke level ant cerv discectomy /fusion w/plating  . CYSTO N/A 03/18/2014   Procedure: CYSTO;  Surgeon: [name], MD;  Location: [facility];  Service: Urology;  Laterality: N/A;  . DEXA  07/30/2015   Osteopenia--repeat 2 yrs (Dr. Reino Kent)  . RECTOCELE REPAIR     with prolasped bledder repair  . RESECTION OF COLON     BENIGN TUMOR  . right hemicolectomy for diverticulitis with abscess  1993  . TUBAL LIGATION      MEDS:  Outpatient Medications Prior to Visit  Medication Sig Dispense Refill  . ALPRAZolam (XANAX) 0.5 MG tablet Take 1 tablet (0.5 mg total) by mouth 3 (three) times daily as needed for anxiety. 90 tablet 5  . aspirin 81 MG tablet Take 81 mg by mouth daily.    . Biotin 10000 MCG TABS Take 1 tablet by mouth daily.    . calcium carbonate (OSCAL) 1500 (600 Ca) MG TABS tablet Take 600 mg of elemental calcium by mouth daily with breakfast.    . CIPRODEX OTIC suspension Place 4 drops into the right ear 2 (two) times daily.  0  . estradiol (ESTRACE VAGINAL) 0.1 MG/GM vaginal cream Apply 1/4 applicator 3 times weekly vaginally. (Patient taking differently: Apply 1/4 applicator 3 times weekly vaginally. As needed) 42.5 g 2  . fexofenadine (ALLEGRA) 60 MG tablet Take 1 tablet (60 mg total) by mouth 2 (two) times daily. 60 tablet 6  . fluticasone (FLONASE) 50 MCG/ACT nasal spray Place 2 sprays into both nostrils at bedtime. 48 g 3  . HYDROcodone-acetaminophen (NORCO/VICODIN) 5-325 MG tablet 1 tab po tid prn pain 90 tablet 0  . Lactobacillus-Inulin (CULTURELLE DIGESTIVE HEALTH PO) Take 1 tablet by mouth daily as needed. GI upset    . lisinopril (PRINIVIL,ZESTRIL) 10 MG tablet Take 1 tablet (10 mg total) by mouth daily. 30 tablet 2  . meloxicam (MOBIC) 15 MG tablet TAKE 1 TABLET EVERY DAY WITH FOOD AS NEEDED FOR MUSCULOSKELETAL PAIN 90 tablet 1  . Multiple Vitamin (MULTIVITAMIN) capsule Take 1 capsule by mouth daily.    Marland Kitchen ofloxacin (FLOXIN) 0.3 %  OTIC solution Place 5 drops into both ears 2 (two) times daily. For up to 7 days for ear pain or drainage.  0  . ondansetron (ZOFRAN) 4 MG tablet Take 4 mg by mouth every 8 (eight) hours as needed for nausea or vomiting.    Marland Kitchen POTASSIUM PO Take 1 tablet by mouth daily.     . Red Yeast Rice 600 MG CAPS Take 1 capsule by mouth daily.     Marland Kitchen terconazole (TERAZOL 7) 0.4 % vaginal cream Place 1 applicator vaginally at bedtime. For 7 nights 45 g 0  . verapamil (CALAN-SR) 180 MG CR tablet TAKE 1 TABLET AT BEDTIME NEEDS TO BE SEEN 90 tablet 0  . vitamin B-12 (CYANOCOBALAMIN) 1000 MCG tablet Take 2,000 mcg by mouth daily.    . Vitamin D, Cholecalciferol, 1000  units CAPS Take 5,000 Units by mouth daily.    . fluticasone (CUTIVATE) 0.05 % cream Apply to affected area bid prn (Patient not taking: Reported on 09/11/2017) 30 g 1   No facility-administered medications prior to visit.   EXAM: BP 126/69 (BP Location: Left Arm, Patient Position: Sitting, Cuff Size: Normal)   Pulse 77   Temp 98 F (36.7 C) (Temporal)   Resp 16   Wt 123 lb (55.8 kg)   LMP 06/27/1972   SpO2 96%   BMI 24.02 kg/m  Gen: alert, tired appearing but NAD. Affect: slightly sad/flat but lucid thought and speech, no crying. ENT: L EAC and TM normal.  R EAC with non-obstructive dark brown cerumen, no blood.  TM intact but sclerotic appearing surface. CV: RRR, no m/r/g.   LUNGS: CTA bilat, nonlabored resps, good aeration in all lung fields. EXT: no clubbing, cyanosis, or edema.    Recent labs/imaging Lab Results  Component Value Date   TSH 0.790 08/31/2017   Lab Results  Component Value Date   WBC 6.5 09/01/2017   HGB 12.1 09/01/2017   HCT 35.6 (L) 09/01/2017   MCV 89.0 09/01/2017   PLT 174 09/01/2017   Lab Results  Component Value Date   CREATININE 0.91 09/01/2017   BUN 14 09/01/2017   NA 131 (L) 09/01/2017   K 4.6 09/01/2017   CL 99 (L) 09/01/2017   CO2 24 09/01/2017   Lab Results  Component Value Date   ALT 22  08/31/2017   AST 25 08/31/2017   ALKPHOS 51 08/31/2017   BILITOT 0.9 08/31/2017   Lab Results  Component Value Date   CHOL 190 01/27/2017   Lab Results  Component Value Date   HDL 71.00 01/27/2017   Lab Results  Component Value Date   LDLCALC 107 (H) 01/27/2017   Lab Results  Component Value Date   TRIG 57.0 01/27/2017   Lab Results  Component Value Date   CHOLHDL 3 01/27/2017   Lab Results  Component Value Date   HGBA1C 4.9 01/30/2017    ASSESSMENT/PLAN:  Hosp f/u:  1) Acute dehydration, with UTI and recent poor po intake due to excessive stress in her life. She is feeling improved, just still with poor energy level but says this is gradually improving. Recent urine recheck in GYN's office normal per pt. Will recheck BMET today. She will continue to push the issue with fluids and food.  2) GAD, with adjustment d/o with anxiety and depression sx's: she absolutely denies feeling depressed and says her anxiety is not anything that needs any treatment at this time.  I offered counseling referral and she declined this. She said she did not want any depression or additional anxiety meds---she said this before I even offered. She wants to continue with prayer, says she thinks things will gradually get better when her husband's health stabilizes, her ear feels improved, and her pelvic mass surgery is over-with this summer.  3) R TM reconstruction surgery 08/23/17: TM is intact today but she has some drainage w/out clear sign of OE. She has a call in to her ENT to get this checked out.  I did not try to extract anything or put anything in her ear today.  4) HTN: control is pretty good w/exception of some mild systolic spikes that I think are likely related to acute-on-chronic stress reaction. No med changes indicated at this time. Monitor lytes/cr today.  Medical decision making of moderate complexity was utilized today.  An  After Visit Summary was printed and given to the  patient.  FOLLOW UP:  3-4 mo  Signed:  Crissie Sickles, MD           09/11/2017

## 2017-09-12 DIAGNOSIS — Z9889 Other specified postprocedural states: Secondary | ICD-10-CM | POA: Diagnosis not present

## 2017-09-12 DIAGNOSIS — S0921XS Traumatic rupture of right ear drum, sequela: Secondary | ICD-10-CM | POA: Diagnosis not present

## 2017-09-13 ENCOUNTER — Telehealth: Payer: Self-pay | Admitting: Family Medicine

## 2017-09-13 NOTE — Telephone Encounter (Signed)
Copied from [place] [id number]. Topic: Inquiry >> Sep 13, 2017 10:14 AM Pricilla Handler wrote: Reason for CRM: Patient called for Lab results and advice. Please call patient at [phone].     Thank You!!!

## 2017-09-13 NOTE — Telephone Encounter (Signed)
See result note.  

## 2017-09-14 ENCOUNTER — Telehealth: Payer: Self-pay | Admitting: *Deleted

## 2017-09-14 NOTE — Telephone Encounter (Signed)
°  Relation to pt: self  Call back number: [phone]  Reason for call:   PCP "recommended patient try to continue to drink pedialyte, gatorade or powerade and water, but not JUST water---she needs the pedialyte and gatorade for the electrolytes". Patient would like to know how much liquid should she consume daily and for how long, please advise

## 2017-09-14 NOTE — Telephone Encounter (Signed)
Pt called follow up from [place] 7/[birth year]/39 has 1 applicator left on Terazol cream left and states still having vaginal burning, states she has done well, last night the burning started again inside and outside. Pt wanted you to be aware of this and if anything else should be done. She is using cream externally as well. Please advise

## 2017-09-14 NOTE — Telephone Encounter (Signed)
Nothing for now other than it is important that she continue on the estradiol vaginal cream twice weekly and use it consistently which will help to strengthen the vaginal tissues.

## 2017-09-14 NOTE — Telephone Encounter (Signed)
Pt advised and voiced understanding.   

## 2017-09-14 NOTE — Telephone Encounter (Signed)
Please advise. Thanks.  

## 2017-09-14 NOTE — Telephone Encounter (Signed)
Pt informed

## 2017-09-14 NOTE — Telephone Encounter (Signed)
Shoot for 75-80 oz per day for at least the next 7 days.-thx

## 2017-09-15 ENCOUNTER — Telehealth: Payer: Self-pay | Admitting: Family Medicine

## 2017-09-15 NOTE — Telephone Encounter (Signed)
Copied from [place] [phone]. Topic: Quick Communication - See Telephone Encounter >> Sep 15, 2017  9:09 AM Ether Griffins B wrote: CRM for notification. See Telephone encounter for: 09/15/17. Pt calling to report the Pedialyte is giving her bad diarrhea and is taking all her strength she wants to know what else can be done to get her electrolytes back up.

## 2017-09-15 NOTE — Telephone Encounter (Signed)
Pt advised and voiced understanding. She also asked she can take something for diarrhea, per Dr. Anitra Lauth okay for her to take Imodium otc follow directions on the packaging. He also stated that pt could drink a cup of orange juice in the morning and in the evening. Pt advised and voiced understanding.

## 2017-09-15 NOTE — Telephone Encounter (Signed)
Reassure her that pedialyte is NOT absolutely essential to get electrolytes back up. Encourage her to eat foods with lots of potassium and sodium (adding salt helps) and continue drinking gatorade and water.  -thx

## 2017-09-15 NOTE — Telephone Encounter (Signed)
Please advise. Thanks.  

## 2017-09-18 ENCOUNTER — Encounter: Payer: Self-pay | Admitting: Family Medicine

## 2017-09-19 DIAGNOSIS — S0921XS Traumatic rupture of right ear drum, sequela: Secondary | ICD-10-CM | POA: Diagnosis not present

## 2017-09-19 DIAGNOSIS — Z9889 Other specified postprocedural states: Secondary | ICD-10-CM | POA: Diagnosis not present

## 2017-09-20 ENCOUNTER — Telehealth: Payer: Self-pay | Admitting: *Deleted

## 2017-09-20 NOTE — Telephone Encounter (Signed)
Copied from [place] [phone]. Topic: Quick Communication - See Telephone Encounter >> Sep 15, 2017  9:09 AM Ether Griffins B wrote: CRM for notification. See Telephone encounter for: 09/15/17. Pt calling to report the Pedialyte is giving her bad diarrhea and is taking all her strength she wants to know what else can be done to get her electrolytes back up.  >> Sep 20, 2017  9:52 AM Burnis Medin, NT wrote: Patient called back and said now she is constipated and wanted the nurse to call her back to let her know what she can take.

## 2017-09-20 NOTE — Telephone Encounter (Signed)
Pt advised and voiced understanding.   

## 2017-09-20 NOTE — Telephone Encounter (Signed)
She should buy over the counter generic senakot S and take 2 tabs every day. Also, she should take miralax powder, 1 capful once daily as needed. Make sure she has stopped imodium.

## 2017-09-20 NOTE — Telephone Encounter (Signed)
Pt called stating she is now constipated and wants to know what she can do. Please advise. Thanks.

## 2017-09-25 ENCOUNTER — Other Ambulatory Visit: Payer: Self-pay

## 2017-09-25 MED ORDER — VERAPAMIL HCL ER 180 MG PO TBCR: EXTENDED_RELEASE_TABLET | ORAL | 1 refills | Status: DC

## 2017-10-03 DIAGNOSIS — T8189XD Other complications of procedures, not elsewhere classified, subsequent encounter: Secondary | ICD-10-CM | POA: Diagnosis not present

## 2017-10-03 DIAGNOSIS — Z48810 Encounter for surgical aftercare following surgery on the sense organs: Secondary | ICD-10-CM | POA: Diagnosis not present

## 2017-10-05 ENCOUNTER — Ambulatory Visit: Payer: Medicare Other | Admitting: Family Medicine

## 2017-10-08 ENCOUNTER — Other Ambulatory Visit: Payer: Self-pay | Admitting: Family Medicine

## 2017-10-09 NOTE — Telephone Encounter (Signed)
[facility]  RF request for meloxicam LOV: 09/11/17 Next ov: 12/13/17 Last written: 03/27/17 #90 w/ 1RF  Please advise. Thanks.

## 2017-10-17 DIAGNOSIS — H7291 Unspecified perforation of tympanic membrane, right ear: Secondary | ICD-10-CM | POA: Diagnosis not present

## 2017-10-17 DIAGNOSIS — H60311 Diffuse otitis externa, right ear: Secondary | ICD-10-CM | POA: Diagnosis not present

## 2017-10-18 ENCOUNTER — Telehealth: Payer: Self-pay

## 2017-10-18 ENCOUNTER — Other Ambulatory Visit: Payer: Self-pay | Admitting: Gynecologic Oncology

## 2017-10-18 DIAGNOSIS — N83201 Unspecified ovarian cyst, right side: Secondary | ICD-10-CM

## 2017-10-18 DIAGNOSIS — N83202 Unspecified ovarian cyst, left side: Principal | ICD-10-CM

## 2017-10-18 NOTE — Telephone Encounter (Signed)
Pt left VM regarding needing to change her appt at [facility] for ultrasound and prefers [facility] location.  Called scheduling and they already spoke to pt and rescheduled her for Friday at 11:15 at [facility] - called pt and she is aware and thankful to have it changed closer to her home. No other needs per pt at this time.

## 2017-10-19 ENCOUNTER — Ambulatory Visit (HOSPITAL_COMMUNITY): Payer: Medicare Other

## 2017-10-20 ENCOUNTER — Ambulatory Visit (HOSPITAL_COMMUNITY)
Admission: RE | Admit: 2017-10-20 | Discharge: 2017-10-20 | Disposition: A | Discharge status: Home / Self Care | Payer: Medicare Other | Source: Ambulatory Visit | Attending: Gynecologic Oncology | Admitting: Gynecologic Oncology

## 2017-10-20 ENCOUNTER — Ambulatory Visit (HOSPITAL_COMMUNITY): Payer: Medicare Other

## 2017-10-20 DIAGNOSIS — N83292 Other ovarian cyst, left side: Secondary | ICD-10-CM | POA: Diagnosis not present

## 2017-10-20 DIAGNOSIS — N839 Noninflammatory disorder of ovary, fallopian tube and broad ligament, unspecified: Secondary | ICD-10-CM | POA: Insufficient documentation

## 2017-10-20 DIAGNOSIS — Z90710 Acquired absence of both cervix and uterus: Secondary | ICD-10-CM | POA: Diagnosis not present

## 2017-10-20 DIAGNOSIS — N83202 Unspecified ovarian cyst, left side: Secondary | ICD-10-CM

## 2017-10-20 DIAGNOSIS — N83291 Other ovarian cyst, right side: Secondary | ICD-10-CM | POA: Diagnosis not present

## 2017-10-20 DIAGNOSIS — N83201 Unspecified ovarian cyst, right side: Secondary | ICD-10-CM | POA: Diagnosis present

## 2017-10-27 DIAGNOSIS — H7291 Unspecified perforation of tympanic membrane, right ear: Secondary | ICD-10-CM | POA: Diagnosis not present

## 2017-10-27 DIAGNOSIS — Q161 Congenital absence, atresia and stricture of auditory canal (external): Secondary | ICD-10-CM | POA: Diagnosis not present

## 2017-11-03 ENCOUNTER — Encounter: Payer: Self-pay | Admitting: Gynecologic Oncology

## 2017-11-03 ENCOUNTER — Inpatient Hospital Stay: Payer: Medicare Other | Attending: Gynecologic Oncology | Admitting: Gynecologic Oncology

## 2017-11-03 ENCOUNTER — Telehealth: Payer: Self-pay

## 2017-11-03 VITALS — BP 155/67 | HR 58 | Temp 97.9°F | Resp 18 | Ht 60.0 in | Wt 125.0 lb

## 2017-11-03 DIAGNOSIS — N83201 Unspecified ovarian cyst, right side: Secondary | ICD-10-CM | POA: Diagnosis not present

## 2017-11-03 NOTE — Progress Notes (Signed)
Follow-up Note: Gyn-Onc  Consult was requested by Dr. Phineas Real for the evaluation of [name] 72 y.o. female  CC:  Chief Complaint  Patient presents with  . Ovarian mass    Assessment/Plan:  Ms. Fredda Clarida  is a 72 y.o.  year old with a moderately large (11cm) right ovarian mostly simple benign appearing cyst associated with a normal CA 125. It now appears to be symptomatic. She has a persistent issue of right ear healing issues post tympanoplasy.  I have some concern about subjecting her to a general anesthetic with ETT given the delayed healing she is experiencing. Therefore, we will plan on surgery in September and preop visit in August (effectively pushing surgery by 3 months to allow Ariah to overcome her middle ear issues).   We are planning a robotic assisted BSO with possible staging. I discussed surgical recovery and surgical risk including  bleeding, infection, damage to internal organs (such as bladder,ureters, bowels), blood clot, reoperation and rehospitalization.  HPI: Shavaun Osterloh is a very pleasant 72 year old parous woman who is seen in consultation at the request of Dr. Phineas Real for a large right ovarian cysts.  The patient has a long-standing history of bladder prolapse and recurrent UTIs for which she was being treated by Dr.MacDiarmid at [facility] urology. Due to the persistent nature of her symptoms she underwent a CT scan of the abdomen and pelvis to workup this issue further that was performed on 10/29/2015. It revealed an incidental finding of a 7.9 cm right pelvic cystic structure.  Follow-up the evaluation was performed with a repeat ultrasound on 03/02/2016. This revealed the cyst to measure a 0.5 cm in largest dimension with a second smaller cyst within it measuring 13 x 12 mm. Left ovary was normal. The uterus was surgically absent. A CA-125 was drawn and was normal at 6.  The patient reports intermittent sharp right flank and lower abdominal pains. Is  unclear what these are associated with, she thinks maybe with straining or lifting she notices some more. She is chronic back pain issues. She's also had a significant surgical history for a laparotomy with hemicolectomy, laparoscopic cholecystectomy, and total abdominal hysterectomy in her 20s for fibroid uterus and pelvic pain.  She is no concerning family history that Child psychotherapist sent out is having a likely genetic predisposition to ovarian or breast cancer.  She is next on performance status she takes no regular anticoagulant medications and has no other major medical issues.    The patient elected for expectant management as she was worried about the convalescence associated with surgical recovery. In January, 2018 she began experiencing intermittent severe abdominal pains. Repeat US in March, 2018 showed a stable 9cm ovarian cyst with homogeneous low level internal echoes and stable small eccentric thin walled 1.1cm daughter cyst. No vascularity or mural nodules.  CA 125 on 08/22/16 was normal and stable at 8.4.  The patient elected for expectant management because she did not want to manage a surgical convalescence while managing her husband's health issues.  In the meantime she developed a severe right ear problem which included erosion of the ear drum and middle ear. This is being managed by an ENT with a plan for reconstruction in March, 2019. This is causing her substantial pain, and anxiety.  She began developing increasing lower abdominal pains in October, 2018. She saw Dr Phineas Real who was concerned that now the cyst was symptomatic she might be interested in its removal.  She wanted to hold off  having surgery for the ovarian mass while she has having major concerns with her ear drum.  Repeat US of the pelvis on 05/25/17 showed a cystic lesion is again seen in the right adnexa which shows low level internal echoes and a tiny peripheral mural cystic structure. No mural solid nodules or  internal septations. This measures 10.6 x 7.1 x 10.6 cm and shows mild increase in size from approximately 8 cm on previous studies dating back to 2017. This has indeterminate but probably benign characteristics.   Interval Hx:  She is s/p tympanoplasty reconstruction surgery on 08/23/17 with Dr May.  She has had some slow healing and has not yet re-epthelialized the bony implants per patient. Is continuing to use drops and see the ENT office regularly for wash outs.   Follow-up ultrasound on October 20, 2017 revealed a stable right ovarian mass measuring 11 x 8.5 x 7.8 cm (previously 10.6 cm in greatest dimension).  The lesion contained low-level complex homogeneous internal echogenicity.  No discrete mural nodularity was seen.  The left ovary was normal and small at 1.3 cm.  The uterus is surgically absent.  Current Meds:  Outpatient Encounter Medications as of 11/03/2017  Medication Sig  . ALPRAZolam (XANAX) 0.5 MG tablet Take 1 tablet (0.5 mg total) by mouth 3 (three) times daily as needed for anxiety.  Marland Kitchen aspirin 81 MG tablet Take 81 mg by mouth daily.  . Biotin 10000 MCG TABS Take 1 tablet by mouth daily.  . calcium carbonate (OSCAL) 1500 (600 Ca) MG TABS tablet Take 600 mg of elemental calcium by mouth daily with breakfast.  . CIPRODEX OTIC suspension Place 4 drops into the right ear 2 (two) times daily.  Marland Kitchen CRANBERRY EXTRACT PO Take by mouth.  . estradiol (ESTRACE VAGINAL) 0.1 MG/GM vaginal cream Apply 1/4 applicator 3 times weekly vaginally. (Patient taking differently: Apply 1/4 applicator 3 times weekly vaginally. As needed)  . fexofenadine (ALLEGRA) 60 MG tablet Take 1 tablet (60 mg total) by mouth 2 (two) times daily.  . fluticasone (CUTIVATE) 0.05 % cream Apply to affected area bid prn  . fluticasone (FLONASE) 50 MCG/ACT nasal spray Place 2 sprays into both nostrils at bedtime.  Marland Kitchen HYDROcodone-acetaminophen (NORCO/VICODIN) 5-325 MG tablet 1 tab po tid prn pain  . Lactobacillus-Inulin  (CULTURELLE DIGESTIVE HEALTH PO) Take 1 tablet by mouth daily as needed. GI upset  . lisinopril (PRINIVIL,ZESTRIL) 10 MG tablet Take 1 tablet (10 mg total) by mouth daily.  . meloxicam (MOBIC) 15 MG tablet TAKE 1 TABLET EVERY DAY WITH FOOD AS NEEDED FOR MUSCULOSKELETAL PAIN  . Multiple Vitamin (MULTIVITAMIN) capsule Take 1 capsule by mouth daily.  Marland Kitchen ofloxacin (FLOXIN) 0.3 % OTIC solution Place 5 drops into both ears 2 (two) times daily. For up to 7 days for ear pain or drainage.  . ondansetron (ZOFRAN) 4 MG tablet Take 4 mg by mouth every 8 (eight) hours as needed for nausea or vomiting.  Marland Kitchen POTASSIUM PO Take 1 tablet by mouth daily.   . Red Yeast Rice 600 MG CAPS Take 1 capsule by mouth daily.   Marland Kitchen terconazole (TERAZOL 7) 0.4 % vaginal cream Place 1 applicator vaginally at bedtime. For 7 nights  . verapamil (CALAN-SR) 180 MG CR tablet TAKE 1 TABLET AT BEDTIME  . vitamin B-12 (CYANOCOBALAMIN) 1000 MCG tablet Take 2,000 mcg by mouth daily.  . Vitamin D, Cholecalciferol, 1000 units CAPS Take 5,000 Units by mouth daily.   No facility-administered encounter medications on file as of  11/03/2017.     Allergy:  Allergies  Allergen Reactions  . Gabapentin Other (See Comments)    Elevated heart rate and crazy dreams  . Prednisone     REACTION: funny feeling    Social Hx:   Social History   Socioeconomic History  . Marital status: Married    Spouse name: [name]  . Number of children: 3  . Years of education: Not on file  . Highest education level: Not on file  Occupational History  . Occupation: retired  Scientific laboratory technician  . Financial resource strain: Not on file  . Food insecurity:    Worry: Not on file    Inability: Not on file  . Transportation needs:    Medical: Not on file    Non-medical: Not on file  Tobacco Use  . Smoking status: Never Smoker  . Smokeless tobacco: Never Used  Substance and Sexual Activity  . Alcohol use: No    Alcohol/week: 0.0 oz  . Drug use: No  .  Sexual activity: Never    Birth control/protection: Surgical    Comment: 1st intercourse 23 yo-1 partner  Lifestyle  . Physical activity:    Days per week: Not on file    Minutes per session: Not on file  . Stress: Not on file  Relationships  . Social connections:    Talks on phone: Not on file    Gets together: Not on file    Attends religious service: Not on file    Active member of club or organization: Not on file    Attends meetings of clubs or organizations: Not on file    Relationship status: Not on file  . Intimate partner violence:    Fear of current or ex partner: Not on file    Emotionally abused: Not on file    Physically abused: Not on file    Forced sexual activity: Not on file  Other Topics Concern  . Not on file  Social History Narrative   Married, has 3 children (one lives near her).  Has 8 grandchildren, 2 greatgrandchildren.   Worked cleaning apartments, worked at [employer], was a Secretary/administrator.   She retired at age 23.   No tobacco.  No alcohol.  No drugs.   Exercise: clean, work in yard.        10 siblings in all--5 have passed away--1 child age 53 with whooping cough , 1 lupus, 1 melanoma,1 lung cancer, 1 pulm fibrosis    Past Surgical Hx:  Past Surgical History:  Procedure Laterality Date  . ABDOMINAL HYSTERECTOMY     TAH  . ANTERIOR AND POSTERIOR REPAIR N/A 03/18/2014   Procedure: Cystocele repair with graft, Vault suspension, Rectocele repair;  Surgeon: [name], MD;  Location: [facility];  Service: Urology;  Laterality: N/A;  . CHOLECYSTECTOMY    . CHOLECYSTECTOMY, LAPAROSCOPIC    . COLON SURGERY    . COLONOSCOPY  04/2006; 05/26/16   2007 (Dr. [name]): Normal.  04/2016 (Dr. [name]) normal except diverticulosis and decreased anal sphincter tone.  No repeat colonoscopy is recommended due to age.  . cspine surgery     Dr. Wiliam Ke level ant cerv discectomy /fusion w/plating  . CYSTO N/A 03/18/2014   Procedure: CYSTO;  Surgeon: [name], MD;  Location: [facility];  Service: Urology;  Laterality: N/A;  . DEXA  07/30/2015   Osteopenia--repeat 2 yrs (Dr. Reino Kent)  . RECTOCELE REPAIR     with prolasped bledder repair  .  RESECTION OF COLON     BENIGN TUMOR  . RIGHT EAR SURGERY  08/23/2017   Dr. 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 right hemicolectomy for diverticulitis with abscess  1993  . TUBAL LIGATION      Past Medical Hx:  Past Medical History:  Diagnosis Date  . Allergic rhinitis    maple pollen  . Anxiety   . Arthritis   . Bowel obstruction ([place])   . Chronic low back pain    Dr. Trenton Gammon.  Has had back injection--bp elevated after.  . DDD (degenerative disc disease), cervical    Hx of ACDF (Dr. [name]).  Followed by Dr. [name].  Also, Dr. [name] do left C7-T1 intralaminal epidural injection.  . Diverticulosis of colon   . DJD (degenerative joint disease)   . Gallstones   . GERD (gastroesophageal reflux disease)   . Herpes zoster 07/08/2014  . Hypercholesteremia    mild; pt declined statin trial 05/2014--needs recheck lipid panel at first f/u visit in 2017  . Hypertension   . Osteopenia 07/2015   T score of -1.4 FRAX15%/2%  . Ovarian cyst 2017   GYN following:  f/u imaging 02/2016 showed stability.  Her GYN referred her to [place] for 2nd opinion 03/02/16.  Gyn onc felt surgery likely not necessary but gave pt option of elective resection---pt declined.  04/2017 f/u: pt symptomatic and to reconsider the option of surgical resection (robotic assisted BSO).  Pt more symptomatic late 2018 and plans on surgery with Dr. Denman George in summer 2019.  Marland Kitchen Perianal dermatitis    prn cutivate  . Phlebitis   . Right knee injury 2018   Patellofemoral crush injury--Dr. [name].  . Trochanteric bursitis of right hip    Recurrent (injection by Dr. [name] 05/26/15)  . 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 Benjamine Mola, will likely get hearing aid.  F/u 03/2017 w/Dr. Yates Decamp sign of infection, option of TM repair or amplification--pt to think abnout it.  As of 05/2017, plan is ossiculoplasty and tympanoplasty with Dr. May on 08/30/16 [facility]).  . Varicose vein    left leg     Past Gynecological History:  SVD x 2, hysterectomy in her 20's Patient's last menstrual period was 06/27/1972.  Family Hx:  Family History  Problem Relation Age of Onset  . Hypertension Mother   . Diverticulitis Mother   . Breast cancer Sister 9  . Melanoma Brother   . Prostate cancer Brother   . Leukemia Brother   . Lupus Sister   . Lung disease Brother   . Stomach cancer Father   . Other Unknown        Family member with MGUS    Review of Systems:  Constitutional  Feels well,    ENT Normal appearing ears and nares bilaterally Skin/Breast  No rash, sores, jaundice, itching, dryness Cardiovascular  No chest pain, shortness of breath, or edema  Pulmonary  No cough or wheeze.  Gastro Intestinal  No nausea, vomitting, or diarrhoea. No bright red blood per rectum, no abdominal pain, change in bowel movement, or constipation.  Genito Urinary  No frequency, urgency, dysuria, occasional lower pelvic right sided pain Musculo Skeletal  No myalgia, arthralgia, joint swelling or pain  Neurologic  No weakness, numbness, change in gait,  Psychology  No depression, anxiety, insomnia.   Vitals:  Blood pressure 115/67, pulse (!) 58, temperature 97.9 F (36.6 C), temperature source Oral, resp. rate 18, height 5' (1.524 m), weight 125 lb (56.7 kg), last menstrual period 06/27/1972, SpO2 99 %.  Physical Exam: WD in NAD Neck  Supple NROM, without any enlargements.  Lymph Node Survey No cervical supraclavicular or inguinal adenopathy Cardiovascular  Pulse normal rate, regularity and rhythm. S1 and S2 normal.  Lungs  Clear to auscultation  bilateraly, without wheezes/crackles/rhonchi. Good air movement.  Skin  No rash/lesions/breakdown  Psychiatry  Alert and oriented to person, place, and time  Abdomen  Normoactive bowel sounds, abdomen soft, non-tender and thin without evidence of hernia.  Back No CVA tenderness Genito Urinary  Vulva/vagina: Normal external female genitalia.  No lesions. No discharge or bleeding.  Bladder/urethra:  No lesions or masses, well supported bladder  Vagina: normal  Cervix: surgically absent  Uterus: surgically absent   Adnexa: can appreciate a smooth walled mass on the mid pelvis, approximately 10cm, minimally mobile but not apparently attached to the vaginal cuff or rectum. Rectal  Good tone, no masses no cul de sac nodularity.  Extremities  No bilateral cyanosis, clubbing or edema.   Thereasa Solo, MD  11/03/2017, 2:39 PM

## 2017-11-03 NOTE — Patient Instructions (Signed)
Preparing for your Surgery  Plan for surgery on March 15, 2018 with Dr. Everitt Amber at [place] will be scheduled for a robotic assisted bilateral salpingo-oophorectomy, possible staging.  STOP TAKING YOUR ASPIRIN 81 MG ONE MONTH BEFORE SURGERY.   Pre-operative Testing -You will receive a phone call from presurgical testing at [facility] to arrange for a pre-operative testing appointment before your surgery.  This appointment normally occurs one to two weeks before your scheduled surgery.   -Bring your insurance card, copy of an advanced directive if applicable, medication list  -At that visit, you will be asked to sign a consent for a possible blood transfusion in case a transfusion becomes necessary during surgery.  The need for a blood transfusion is rare but having consent is a necessary part of your care.     -You should not be taking blood thinners or aspirin at least ten days prior to surgery unless instructed by your surgeon.  Day Before Surgery at [place] will be asked to take in a light diet the day before surgery.  Avoid carbonated beverages.  You will be advised to have nothing to eat or drink after midnight the evening before.    Eat a light diet the day before surgery.  Examples including soups, broths, toast, yogurt, mashed potatoes.  Things to avoid include carbonated beverages (fizzy beverages), raw fruits and raw vegetables, or beans.   If your bowels are filled with gas, your surgeon will have difficulty visualizing your pelvic organs which increases your surgical risks.  Your role in recovery Your role is to become active as soon as directed by your doctor, while still giving yourself time to heal.  Rest when you feel tired. You will be asked to do the following in order to speed your recovery:  - Cough and breathe deeply. This helps toclear and expand your lungs and can prevent pneumonia. You may be given a spirometer to  practice deep breathing. A staff member will show you how to use the spirometer. - Do mild physical activity. Walking or moving your legs help your circulation and body functions return to normal. A staff member will help you when you try to walk and will provide you with simple exercises. Do not try to get up or walk alone the first time. - Actively manage your pain. Managing your pain lets you move in comfort. We will ask you to rate your pain on a scale of zero to 10. It is your responsibility to tell your doctor or nurse where and how much you hurt so your pain can be treated.  Special Considerations -If you are diabetic, you may be placed on insulin after surgery to have closer control over your blood sugars to promote healing and recovery.  This does not mean that you will be discharged on insulin.  If applicable, your oral antidiabetics will be resumed when you are tolerating a solid diet.  -Your final pathology results from surgery should be available by the Friday after surgery and the results will be relayed to you when available.  -Dr. Lahoma Crocker is the Surgeon that assists your GYN Oncologist with surgery.  The next day after your surgery you will either see your GYN Oncologist or Dr. Lahoma Crocker.   Blood Transfusion Information WHAT IS A BLOOD TRANSFUSION? A transfusion is the replacement of blood or some of its parts. Blood is made up of multiple cells which provide different functions.  Red blood  cells carry oxygen and are used for blood loss replacement.  White blood cells fight against infection.  Platelets control bleeding.  Plasma helps clot blood.  Other blood products are available for specialized needs, such as hemophilia or other clotting disorders. BEFORE THE TRANSFUSION  Who gives blood for transfusions?   You may be able to donate blood to be used at a later date on yourself (autologous donation).  Relatives can be asked to donate blood. This is  generally not any safer than if you have received blood from a stranger. The same precautions are taken to ensure safety when a relative's blood is donated.  Healthy volunteers who are fully evaluated to make sure their blood is safe. This is blood bank blood. Transfusion therapy is the safest it has ever been in the practice of medicine. Before blood is taken from a donor, a complete history is taken to make sure that person has no history of diseases nor engages in risky social behavior (examples are intravenous drug use or sexual activity with multiple partners). The donor's travel history is screened to minimize risk of transmitting infections, such as malaria. The donated blood is tested for signs of infectious diseases, such as HIV and hepatitis. The blood is then tested to be sure it is compatible with you in order to minimize the chance of a transfusion reaction. If you or a relative donates blood, this is often done in anticipation of surgery and is not appropriate for emergency situations. It takes many days to process the donated blood. RISKS AND COMPLICATIONS Although transfusion therapy is very safe and saves many lives, the main dangers of transfusion include:   Getting an infectious disease.  Developing a transfusion reaction. This is an allergic reaction to something in the blood you were given. Every precaution is taken to prevent this. The decision to have a blood transfusion has been considered carefully by your caregiver before blood is given. Blood is not given unless the benefits outweigh the risks.

## 2017-11-03 NOTE — Telephone Encounter (Signed)
ENCOUNTER OPENED IN ERROR

## 2017-11-06 ENCOUNTER — Other Ambulatory Visit: Payer: Self-pay | Admitting: *Deleted

## 2017-11-06 MED ORDER — ALPRAZOLAM 0.5 MG PO TABS: 0.5000 mg | ORAL_TABLET | Freq: Three times a day (TID) | ORAL | 5 refills | Status: DC | PRN

## 2017-11-06 NOTE — Telephone Encounter (Signed)
[facility]  RF request for alprazolam LOV: 07/12/17 Next ov: 12/13/17 Last written: 02/16/18 #90 w/ 5RF  Please advise. Thanks.

## 2017-11-06 NOTE — Telephone Encounter (Signed)
Rx faxed

## 2017-11-14 ENCOUNTER — Encounter: Payer: Self-pay | Admitting: Family Medicine

## 2017-11-17 DIAGNOSIS — H60311 Diffuse otitis externa, right ear: Secondary | ICD-10-CM | POA: Diagnosis not present

## 2017-11-17 DIAGNOSIS — H7291 Unspecified perforation of tympanic membrane, right ear: Secondary | ICD-10-CM | POA: Diagnosis not present

## 2017-12-12 ENCOUNTER — Encounter: Payer: Self-pay | Admitting: *Deleted

## 2017-12-13 ENCOUNTER — Ambulatory Visit: Payer: Medicare Other | Admitting: Family Medicine

## 2017-12-15 DIAGNOSIS — H60311 Diffuse otitis externa, right ear: Secondary | ICD-10-CM | POA: Diagnosis not present

## 2017-12-15 DIAGNOSIS — H7291 Unspecified perforation of tympanic membrane, right ear: Secondary | ICD-10-CM | POA: Diagnosis not present

## 2017-12-18 ENCOUNTER — Telehealth: Payer: Self-pay | Admitting: *Deleted

## 2017-12-18 NOTE — Telephone Encounter (Signed)
Patient called and left a message that "the doctor for my ear surgery sad it was doing well."

## 2017-12-19 ENCOUNTER — Ambulatory Visit (INDEPENDENT_AMBULATORY_CARE_PROVIDER_SITE_OTHER): Payer: Medicare Other | Admitting: Family Medicine

## 2017-12-19 ENCOUNTER — Encounter: Payer: Self-pay | Admitting: Family Medicine

## 2017-12-19 VITALS — BP 128/64 | HR 67 | Temp 97.8°F | Resp 16 | Ht 60.0 in | Wt 125.1 lb

## 2017-12-19 DIAGNOSIS — E538 Deficiency of other specified B group vitamins: Secondary | ICD-10-CM

## 2017-12-19 DIAGNOSIS — G894 Chronic pain syndrome: Secondary | ICD-10-CM | POA: Diagnosis not present

## 2017-12-19 DIAGNOSIS — Z79899 Other long term (current) drug therapy: Secondary | ICD-10-CM

## 2017-12-19 DIAGNOSIS — M545 Low back pain, unspecified: Secondary | ICD-10-CM

## 2017-12-19 DIAGNOSIS — E559 Vitamin D deficiency, unspecified: Secondary | ICD-10-CM | POA: Diagnosis not present

## 2017-12-19 DIAGNOSIS — G8929 Other chronic pain: Secondary | ICD-10-CM

## 2017-12-19 DIAGNOSIS — I10 Essential (primary) hypertension: Secondary | ICD-10-CM

## 2017-12-19 DIAGNOSIS — R5382 Chronic fatigue, unspecified: Secondary | ICD-10-CM

## 2017-12-19 MED ORDER — HYDROCODONE-ACETAMINOPHEN 5-325 MG PO TABS: ORAL_TABLET | ORAL | 0 refills | Status: DC

## 2017-12-19 NOTE — Progress Notes (Signed)
OFFICE VISIT  12/19/2017   CC:  Chief Complaint  Patient presents with  . Follow-up    RCI. pt is fasting.   HPI:    Patient is a 72 y.o. Caucasian female who presents for  f/u HTN, chronic pain syndrome, GAD with adjustment d/o with anx/dep features.  She is set up to get bilateral salpigo-oopherectomy this august.  Suspected benign ovarian mass but causes pain and GYN wants to make sure not malignant.  Her pain is bad and she wants to call surgeon to see if it can be moved up.  Indication for chronic opioid: chronic low back and neck pain from DDD, not candidate for further surgical intervention, failed ESI. Medication and dose: Vicodin 5/325, 1 tid prn # pills per month: 90 Last UDS date: 12/05/16 Opioid Treatment Agreement signed (Y/N): Y, 12/05/16. Opioid Treatment Agreement last reviewed with patient: Today. [place] reviewed this encounter (include red flags): today, no red flags. Takes vicodin less than 3 times per day, is afraid of getting dependent on this. Took pain pill early this morning--took 1/2 tab.  Also takes alprazolam 0.5mg  tid prn for chronic anxiety.  She is still struggling a lot with this problem--chronic pain has her down, husband having health problems getting worse, ongoing ear/hearing problems. Taking the xanax on prn basis---at least nightly and sometimes a daytime dose. She does not want to take any antidepressant --"I'll pray instead".   Most recent dose of xanax was last night.   HTN: avg 130s/80s.  Occ elevation with acute pain.  Takes lisin 10mg  only 1/2 tab and takes verapamil daily as well.  ROS: no CP, no palpitations, no dizziness, no SOB, no LE swelling, no melena. Chronic lower abd/pelvic pain that waxes and wanes.  No n/v.  No HAs. Chronic fatigue++.   Past Medical History:  Diagnosis Date  . Allergic rhinitis    maple pollen  . Anxiety   . Arthritis   . Bowel obstruction ([place])   . Chronic low back pain    Dr. Trenton Gammon.  Has had back  injection--bp elevated after.  . Chronic pain syndrome   . DDD (degenerative disc disease), cervical    Hx of ACDF (Alison Cuevas).  Followed by Alison Cuevas.  Also, Dr. [name] do left C7-T1 intralaminal epidural injection.  . Diverticulosis of colon   . DJD (degenerative joint disease)   . Gallstones   . GERD (gastroesophageal reflux disease)   . Herpes zoster 07/08/2014  . Hypercholesteremia    mild; pt declined statin trial 05/2014--needs recheck lipid panel at first f/u visit in 2017  . Hypertension   . Osteopenia 07/2015   T score of -1.4 FRAX15%/2%  . Ovarian cyst 2017   GYN following:  f/u imaging 02/2016 showed stability.  Her GYN referred her to [place] for 2nd opinion 03/02/16.  Gyn onc felt surgery likely not necessary but gave pt option of elective resection---pt declined.  04/2017 f/u: pt symptomatic and to reconsider the option of surgery.  Pt more symptomatic late 2018 and surgery is planned for 92019-robotic assisted bilatel SPO w/possible staging Denman George)  . Perianal dermatitis    prn cutivate  . Phlebitis   . Right knee injury 2018   Patellofemoral crush injury--Alison Cuevas.  . Trochanteric bursitis of right hip    Recurrent (injection by Alison Cuevas 05/26/15)  . 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  F/u 03/2017 w/Alison Cuevas sign  of infection, option of TM repair or amplification--pt to think abnout it.  As of 05/2017, plan is ossiculoplasty and tympanoplasty with Alison Cuevas on 08/30/16 [facility]).  . Varicose vein    left leg     Past Surgical History:  Procedure Laterality Date  . ABDOMINAL HYSTERECTOMY     TAH  . ANTERIOR AND POSTERIOR REPAIR N/A 03/18/2014   Procedure: Cystocele repair with graft, Vault suspension, Rectocele repair;  Surgeon: Alison Packer, MD;  Location: [facility];  Service: Urology;  Laterality: N/A;  . CHOLECYSTECTOMY    . CHOLECYSTECTOMY,  LAPAROSCOPIC    . COLON SURGERY    . COLONOSCOPY  04/2006; 05/26/16   2007 (Alison Cuevas): Normal.  04/2016 (Alison Cuevas) normal except diverticulosis and decreased anal sphincter tone.  No repeat colonoscopy is recommended due to age.  . cspine surgery     Alison Cuevas level ant cerv discectomy /fusion w/plating  . CYSTO N/A 03/18/2014   Procedure: CYSTO;  Surgeon: Alison Packer, MD;  Location: [facility];  Service: Urology;  Laterality: N/A;  . DEXA  07/30/2015   Osteopenia--repeat 2 yrs (Dr. Reino Kent)  . RECTOCELE REPAIR     with prolasped bledder repair  . RESECTION OF COLON     BENIGN TUMOR  . 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 right hemicolectomy for diverticulitis with abscess  1993  . TUBAL LIGATION      Outpatient Medications Prior to Visit  Medication Sig Dispense Refill  . ALPRAZolam (XANAX) 0.5 MG tablet Take 1 tablet (0.5 mg total) by mouth 3 (three) times daily as needed for anxiety. 90 tablet 5  . aspirin 81 MG tablet Take 81 mg by mouth daily.    . Biotin 10000 MCG TABS Take 1 tablet by mouth daily.    . calcium carbonate (OSCAL) 1500 (600 Ca) MG TABS tablet Take 600 mg of elemental calcium by mouth daily with breakfast.    . CIPRODEX OTIC suspension Place 4 drops into the right ear 2 (two) times daily.  0  . CRANBERRY EXTRACT PO Take by mouth.    . estradiol (ESTRACE VAGINAL) 0.1 MG/GM vaginal cream Apply 1/4 applicator 3 times weekly vaginally. (Patient taking differently: Apply 1/4 applicator 3 times weekly vaginally. As needed) 42.5 g 2  . fexofenadine (ALLEGRA) 60 MG tablet Take 1 tablet (60 mg total) by mouth 2 (two) times daily. 60 tablet 6  . fluticasone (CUTIVATE) 0.05 % cream Apply to affected area bid prn 30 g 1  . fluticasone (FLONASE) 50 MCG/ACT nasal spray Place 2 sprays into both nostrils at  bedtime. 48 g 3  . Lactobacillus-Inulin (CULTURELLE DIGESTIVE HEALTH PO) Take 1 tablet by mouth daily as needed. GI upset    . lisinopril (PRINIVIL,ZESTRIL) 10 MG tablet Take 1 tablet (10 mg total) by mouth daily. 30 tablet 2  . meloxicam (MOBIC) 15 MG tablet TAKE 1 TABLET EVERY DAY WITH FOOD AS NEEDED FOR MUSCULOSKELETAL PAIN 90 tablet 1  . Multiple Vitamin (MULTIVITAMIN) capsule Take 1 capsule by mouth daily.    Marland Kitchen ofloxacin (FLOXIN) 0.3 % OTIC solution Place 5 drops into both ears 2 (two) times daily. For up to 7 days for ear pain or drainage.  0  . ondansetron (ZOFRAN) 4 MG tablet Take 4 mg by mouth every 8 (eight) hours as needed  for nausea or vomiting.    Marland Kitchen POTASSIUM PO Take 1 tablet by mouth daily.     . Red Yeast Rice 600 MG CAPS Take 1 capsule by mouth daily.     Marland Kitchen terconazole (TERAZOL 7) 0.4 % vaginal cream Place 1 applicator vaginally at bedtime. For 7 nights 45 g 0  . verapamil (CALAN-SR) 180 MG CR tablet TAKE 1 TABLET AT BEDTIME 90 tablet 1  . vitamin B-12 (CYANOCOBALAMIN) 1000 MCG tablet Take 2,000 mcg by mouth daily.    . Vitamin D, Cholecalciferol, 1000 units CAPS Take 5,000 Units by mouth daily.    Marland Kitchen HYDROcodone-acetaminophen (NORCO/VICODIN) 5-325 MG tablet 1 tab po tid prn pain 90 tablet 0   No facility-administered medications prior to visit.     Allergies  Allergen Reactions  . Gabapentin Other (See Comments)    Elevated heart rate and crazy dreams  . Prednisone     REACTION: funny feeling    ROS As per HPI  PE: Blood pressure 128/64, pulse 67, temperature 97.8 F (36.6 C), temperature source Oral, resp. rate 16, height 5' (1.524 m), weight 125 lb 2 oz (56.8 kg), last menstrual period 06/27/1972, SpO2 95 %. Gen: Alert, well appearing.  Patient is oriented to person, place, time, and situation. Affect: morose/melancholgy, lucid thought and speech. CV: RRR, no m/r/g.   LUNGS: CTA bilat, nonlabored resps, good aeration in all lung fields. EXT: no clubbing,  cyanosis, or edema.    LABS:    Chemistry      Component Value Date/Time   NA 129 (L) 09/11/2017 1154   K 4.3 09/11/2017 1154   CL 94 (L) 09/11/2017 1154   CO2 27 09/11/2017 1154   BUN 8 09/11/2017 1154   CREATININE 0.75 09/11/2017 1154   CREATININE 0.87 06/03/2015 1034      Component Value Date/Time   CALCIUM 9.4 09/11/2017 1154   ALKPHOS 51 08/31/2017 0413   AST 25 08/31/2017 0413   ALT 22 08/31/2017 0413   BILITOT 0.9 08/31/2017 0413     Lab Results  Component Value Date   WBC 6.5 09/01/2017   HGB 12.1 09/01/2017   HCT 35.6 (L) 09/01/2017   MCV 89.0 09/01/2017   PLT 174 09/01/2017   Lab Results  Component Value Date   CHOL 190 01/27/2017   HDL 71.00 01/27/2017   LDLCALC 107 (H) 01/27/2017   LDLDIRECT 136.9 09/20/2007   TRIG 57.0 01/27/2017   CHOLHDL 3 01/27/2017   Lab Results  Component Value Date   TSH 0.790 08/31/2017   Lab Results  Component Value Date   HGBA1C 4.9 01/30/2017   Lab Results  Component Value Date   VITAMINB12 1,438 (H) 08/08/2016    IMPRESSION AND PLAN:  1) HTN: The current medical regimen is effective;  continue present plan and medications. Lytes/cr today.  2) Chronic pain syndrome: bilat LBP w/out radiculopathy.  She holds back on adequate pain control b/c of fear of dependence, but does get moderate relief of pain when she does take the vicodin (1/2-1 tab at a time). Controlled substance contract renewed today. UDS today. I printed rx's for vicodin 5/325, 1 tid, #90 today for this month, July 2019, and Aug 2019.  Appropriate fill on/after date was noted on each rx.  3) Chronic generalized anxiety, with superimposed adjustment d/o with anxiety>depression sx's. Pt declines antidepressant or counseling. She'll continue with hs xanax and occ xanax dose in daytime. No new rx for this med was needed today. UDS today.  4) Chronic pelvic pain: to get ovarian mass removed in August, but due to worsening pain she will try to move the  surgery date up with Dr. Denman George, her surgeon. CMET and CBC w/diff today.  5) Chronic fatigue: due to chronic pain and anxiety. Will check vit B12 (B12 def) and vit D (vit D def), as well as CBC w/diff and CMET mentioned above.  An After Visit Summary was printed and given to the patient.  FOLLOW UP: Return in about 3 months (around 03/21/2018) for routine chronic illness f/u (30 min).  Signed:  Crissie Sickles, MD           12/19/2017

## 2017-12-20 LAB — COMPREHENSIVE METABOLIC PANEL
AG Ratio: 1.7 (calc) (ref 1.0–2.5)
ALT: 18 U/L (ref 6–29)
AST: 25 U/L (ref 10–35)
Albumin: 4.1 g/dL (ref 3.6–5.1)
Alkaline phosphatase (APISO): 65 U/L (ref 33–130)
BUN: 14 mg/dL (ref 7–25)
CO2: 28 mmol/L (ref 20–32)
Calcium: 9.5 mg/dL (ref 8.6–10.4)
Chloride: 102 mmol/L (ref 98–110)
Creat: 0.82 mg/dL (ref 0.60–0.93)
Globulin: 2.4 g/dL (calc) (ref 1.9–3.7)
Glucose, Bld: 86 mg/dL (ref 65–99)
Potassium: 4.2 mmol/L (ref 3.5–5.3)
Sodium: 137 mmol/L (ref 135–146)
Total Bilirubin: 1 mg/dL (ref 0.2–1.2)
Total Protein: 6.5 g/dL (ref 6.1–8.1)

## 2017-12-20 LAB — CBC WITH DIFFERENTIAL/PLATELET
Basophils Absolute: 31 cells/uL (ref 0–200)
Basophils Relative: 0.6 %
Eosinophils Absolute: 112 cells/uL (ref 15–500)
Eosinophils Relative: 2.2 %
HCT: 36.8 % (ref 35.0–45.0)
Hemoglobin: 12.7 g/dL (ref 11.7–15.5)
Lymphs Abs: 2749 cells/uL (ref 850–3900)
MCH: 30.6 pg (ref 27.0–33.0)
MCHC: 34.5 g/dL (ref 32.0–36.0)
MCV: 88.7 fL (ref 80.0–100.0)
MPV: 11 fL (ref 7.5–12.5)
Monocytes Relative: 7.9 %
Neutro Abs: 1805 cells/uL (ref 1500–7800)
Neutrophils Relative %: 35.4 %
Platelets: 197 10*3/uL (ref 140–400)
RBC: 4.15 10*6/uL (ref 3.80–5.10)
RDW: 11.9 % (ref 11.0–15.0)
Total Lymphocyte: 53.9 %
WBC mixed population: 403 cells/uL (ref 200–950)
WBC: 5.1 10*3/uL (ref 3.8–10.8)

## 2017-12-20 LAB — VITAMIN D 25 HYDROXY (VIT D DEFICIENCY, FRACTURES): Vit D, 25-Hydroxy: 63 ng/mL (ref 30–100)

## 2017-12-20 LAB — VITAMIN B12: Vitamin B-12: >2000 pg/mL — ABNORMAL HIGH (ref 200–1100)

## 2017-12-22 LAB — PAIN MGMT, PROFILE 8 W/CONF, U
6 Acetylmorphine: NEGATIVE ng/mL (ref <10)
Alcohol Metabolites: NEGATIVE ng/mL (ref <500)
Alphahydroxyalprazolam: 370 ng/mL — ABNORMAL HIGH (ref <25)
Alphahydroxymidazolam: NEGATIVE ng/mL (ref <50)
Alphahydroxytriazolam: NEGATIVE ng/mL (ref <50)
Aminoclonazepam: NEGATIVE ng/mL (ref <25)
Amphetamines: NEGATIVE ng/mL (ref <500)
Benzodiazepines: POSITIVE ng/mL — AB (ref <100)
Buprenorphine, Urine: NEGATIVE ng/mL (ref <5)
Cocaine Metabolite: NEGATIVE ng/mL (ref <150)
Codeine: NEGATIVE ng/mL (ref <50)
Creatinine: 145.2 mg/dL
Hydrocodone: 887 ng/mL — ABNORMAL HIGH (ref <50)
Hydromorphone: 249 ng/mL — ABNORMAL HIGH (ref <50)
Hydroxyethylflurazepam: NEGATIVE ng/mL (ref <50)
Lorazepam: NEGATIVE ng/mL (ref <50)
MDMA: NEGATIVE ng/mL (ref <500)
Marijuana Metabolite: NEGATIVE ng/mL (ref <20)
Morphine: NEGATIVE ng/mL (ref <50)
Nordiazepam: NEGATIVE ng/mL (ref <50)
Norhydrocodone: 882 ng/mL — ABNORMAL HIGH (ref <50)
Opiates: POSITIVE ng/mL — AB (ref <100)
Oxazepam: NEGATIVE ng/mL (ref <50)
Oxidant: NEGATIVE ug/mL (ref <200)
Oxycodone: NEGATIVE ng/mL (ref <100)
Temazepam: NEGATIVE ng/mL (ref <50)
pH: 6.53 (ref 4.5–9.0)

## 2017-12-25 ENCOUNTER — Telehealth: Payer: Self-pay | Admitting: Oncology

## 2017-12-25 ENCOUNTER — Telehealth: Payer: Self-pay

## 2017-12-25 NOTE — Telephone Encounter (Signed)
Received a call back from [place], Triage nurse, who will forward this question on to Dr. Jimmy Footman staff.  Once they receive a reply, they will call us back.

## 2017-12-25 NOTE — Telephone Encounter (Signed)
Told Alison Cuevas that Dr. Denman George is fine with rescheduling her surgery to August. Received office note from Dr. May from 11-21-17 stating proceeding with GYN surgery was ok from his standpoint.   Will check with Dr. Lu Duffel to be sure that pt 's ear can tolerate being in trendelenburg for ~ 1 hour for surgery.   Gave Alison Cuevas  The following dates in August for surgery: August 6, 7, 8, and 26. She will discuss the dates with her children and call back to the office tomorrow with chosen date.

## 2017-12-25 NOTE — Telephone Encounter (Signed)
Left a message for Dr. Jimmy Footman nurse regarding whether patient can be in trendelenburg position for 1-2 hours during surgery.

## 2017-12-26 NOTE — Telephone Encounter (Signed)
Pt would like surgery on 01-31-18. She also wants a visit with Dr. Denman George prior to surgery. Information given to Melissa Cross,NP.

## 2017-12-26 NOTE — Telephone Encounter (Signed)
Told Ms Whan  that she is scheduled for surgery on 01-31-18.  She will get a call from pre surgical testing to set up a pre-op appointment prior to surgery. She is set up for an appointment to see Dr. Denman George on 01-19-18 at 1430 as she requested prior to surgery.

## 2017-12-27 ENCOUNTER — Encounter (HOSPITAL_COMMUNITY): Payer: Self-pay | Admitting: *Deleted

## 2017-12-27 ENCOUNTER — Telehealth: Payer: Self-pay | Admitting: *Deleted

## 2017-12-27 NOTE — Telephone Encounter (Signed)
Patient called and reschedul;ed her appt

## 2018-01-09 DIAGNOSIS — H9211 Otorrhea, right ear: Secondary | ICD-10-CM | POA: Diagnosis not present

## 2018-01-09 DIAGNOSIS — H6041 Cholesteatoma of right external ear: Secondary | ICD-10-CM | POA: Diagnosis not present

## 2018-01-09 DIAGNOSIS — H7291 Unspecified perforation of tympanic membrane, right ear: Secondary | ICD-10-CM | POA: Diagnosis not present

## 2018-01-09 DIAGNOSIS — Z9889 Other specified postprocedural states: Secondary | ICD-10-CM | POA: Diagnosis not present

## 2018-01-15 ENCOUNTER — Encounter: Payer: Self-pay | Admitting: Gynecologic Oncology

## 2018-01-15 ENCOUNTER — Inpatient Hospital Stay: Payer: Medicare Other | Attending: Gynecologic Oncology | Admitting: Gynecologic Oncology

## 2018-01-15 VITALS — BP 156/78 | HR 58 | Temp 97.9°F | Resp 20 | Ht 61.0 in | Wt 130.1 lb

## 2018-01-15 DIAGNOSIS — N83201 Unspecified ovarian cyst, right side: Secondary | ICD-10-CM | POA: Insufficient documentation

## 2018-01-15 DIAGNOSIS — Z90710 Acquired absence of both cervix and uterus: Secondary | ICD-10-CM | POA: Diagnosis not present

## 2018-01-15 NOTE — Patient Instructions (Signed)
Preparing for your Surgery  Plan for surgery on January 31, 2018 with Dr. Everitt Amber at [place] will be scheduled for robotic assisted bilateral salpingo-oophorectomy, possible lysis of adhesions.  Pre-operative Testing -You will receive a phone call from presurgical testing at [facility] to arrange for a pre-operative testing appointment before your surgery.  This appointment normally occurs one to two weeks before your scheduled surgery.   -Bring your insurance card, copy of an advanced directive if applicable, medication list  -At that visit, you will be asked to sign a consent for a possible blood transfusion in case a transfusion becomes necessary during surgery.  The need for a blood transfusion is rare but having consent is a necessary part of your care.     -You should not be taking blood thinners or aspirin at least ten days prior to surgery unless instructed by your surgeon.  Day Before Surgery at [place] will be asked to take in a light diet the day before surgery.  Avoid carbonated beverages.  You will be advised to have nothing to eat or drink after midnight the evening before.    Eat a light diet the day before surgery.  Examples including soups, broths, toast, yogurt, mashed potatoes.  Things to avoid include carbonated beverages (fizzy beverages), raw fruits and raw vegetables, or beans.   If your bowels are filled with gas, your surgeon will have difficulty visualizing your pelvic organs which increases your surgical risks.  Your role in recovery Your role is to become active as soon as directed by your doctor, while still giving yourself time to heal.  Rest when you feel tired. You will be asked to do the following in order to speed your recovery:  - Cough and breathe deeply. This helps toclear and expand your lungs and can prevent pneumonia. You may be given a spirometer to practice deep breathing. A staff member will show you how  to use the spirometer. - Do mild physical activity. Walking or moving your legs help your circulation and body functions return to normal. A staff member will help you when you try to walk and will provide you with simple exercises. Do not try to get up or walk alone the first time. - Actively manage your pain. Managing your pain lets you move in comfort. We will ask you to rate your pain on a scale of zero to 10. It is your responsibility to tell your doctor or nurse where and how much you hurt so your pain can be treated.  Special Considerations -If you are diabetic, you may be placed on insulin after surgery to have closer control over your blood sugars to promote healing and recovery.  This does not mean that you will be discharged on insulin.  If applicable, your oral antidiabetics will be resumed when you are tolerating a solid diet.  -Your final pathology results from surgery should be available by the Friday after surgery and the results will be relayed to you when available.  -Dr. Lahoma Crocker is the Surgeon that assists your GYN Oncologist with surgery.  The next day after your surgery you will either see your GYN Oncologist or Dr. Lahoma Crocker.   Blood Transfusion Information WHAT IS A BLOOD TRANSFUSION? A transfusion is the replacement of blood or some of its parts. Blood is made up of multiple cells which provide different functions.  Red blood cells carry oxygen and are used for blood loss replacement.  White blood cells fight against infection.  Platelets control bleeding.  Plasma helps clot blood.  Other blood products are available for specialized needs, such as hemophilia or other clotting disorders. BEFORE THE TRANSFUSION  Who gives blood for transfusions?   You may be able to donate blood to be used at a later date on yourself (autologous donation).  Relatives can be asked to donate blood. This is generally not any safer than if you have received blood  from a stranger. The same precautions are taken to ensure safety when a relative's blood is donated.  Healthy volunteers who are fully evaluated to make sure their blood is safe. This is blood bank blood. Transfusion therapy is the safest it has ever been in the practice of medicine. Before blood is taken from a donor, a complete history is taken to make sure that person has no history of diseases nor engages in risky social behavior (examples are intravenous drug use or sexual activity with multiple partners). The donor's travel history is screened to minimize risk of transmitting infections, such as malaria. The donated blood is tested for signs of infectious diseases, such as HIV and hepatitis. The blood is then tested to be sure it is compatible with you in order to minimize the chance of a transfusion reaction. If you or a relative donates blood, this is often done in anticipation of surgery and is not appropriate for emergency situations. It takes many days to process the donated blood. RISKS AND COMPLICATIONS Although transfusion therapy is very safe and saves many lives, the main dangers of transfusion include:   Getting an infectious disease.  Developing a transfusion reaction. This is an allergic reaction to something in the blood you were given. Every precaution is taken to prevent this. The decision to have a blood transfusion has been considered carefully by your caregiver before blood is given. Blood is not given unless the benefits outweigh the risks.

## 2018-01-15 NOTE — Progress Notes (Signed)
Follow-up Note: Gyn-Onc  Consult was requested by Dr. Phineas Real for the evaluation of Alison Cuevas 72 y.o. female  CC:  Chief Complaint  Patient presents with  . Ovarian mass    Assessment/Plan:  [name]  is a 72 y.o.  year old with a moderately large (11cm) right ovarian mostly simple benign appearing cyst associated with a normal CA 125. It now appears to be symptomatic. She has a persistent issue of right ear healing issues post tympanoplasy.   We are planning a robotic assisted BSO with possible staging. I discussed surgical recovery and surgical risk including  bleeding, infection, damage to internal organs (such as bladder,ureters, bowels), blood clot, reoperation and rehospitalization.  Her ENT was notified of the surgery and approves the use of trendelenberg positioning.  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 Phineas Real for a large right ovarian cysts.  The patient has a long-standing history of bladder prolapse and recurrent UTIs for which she was being treated by Dr.MacDiarmid at [facility] urology. Due to the persistent nature of her symptoms she underwent a CT scan of the abdomen and pelvis to workup this issue further that was performed on 10/29/2015. It revealed an incidental finding of a 7.9 cm right pelvic cystic structure.  Follow-up the evaluation was performed with a repeat ultrasound on 03/02/2016. This revealed the cyst to measure a 0.5 cm in largest dimension with a second smaller cyst within it measuring 13 x 12 mm. Left ovary was normal. The uterus was surgically absent. A CA-125 was drawn and was normal at 6.  The patient reports intermittent sharp right flank and lower abdominal pains. Is unclear what these are associated with, she thinks maybe with straining or lifting she notices some more. She is chronic back pain issues. She's also had a significant surgical history for a  laparotomy with hemicolectomy, laparoscopic cholecystectomy, and total abdominal hysterectomy in her 20s for fibroid uterus and pelvic pain.  She is no concerning family history that she has a likely genetic predisposition to ovarian or breast cancer.  She had an excellent performance status she takes aspirin.  The patient elected for expectant management as she was worried about the convalescence associated with surgical recovery. In January, 2018 she began experiencing intermittent severe abdominal pains. Repeat US in March, 2018 showed a stable 9cm ovarian cyst with homogeneous low level internal echoes and stable small eccentric thin walled 1.1cm daughter cyst. No vascularity or mural nodules.  CA 125 on 08/22/16 was normal and stable at 8.4.  The patient elected for expectant management because she did not want to manage a surgical convalescence while managing her husband's health issues.  In the meantime she developed a severe right ear problem which included erosion of the ear drum and middle ear. This is being managed by an ENT with a plan for reconstruction in March, 2019. This is causing her substantial pain, and anxiety.  She began developing increasing lower abdominal pains in October, 2018. She saw Dr Phineas Real who was concerned that now the cyst was symptomatic she might be interested in its removal.  She wanted to hold off having surgery for the ovarian mass while she has having major concerns with her ear drum.  Repeat US of the pelvis on 05/25/17 showed a cystic lesion is again seen in the right adnexa which shows low level internal echoes and a tiny peripheral mural cystic structure. No  mural solid nodules or internal septations. This measures 10.6 x 7.1 x 10.6 cm and shows mild increase in size from approximately 8 cm on previous studies dating back to 2017. This has indeterminate but probably benign characteristics.   She is s/p tympanoplasty reconstruction surgery on 08/23/17  with Dr May.  She has had some slow healing and has not yet re-epthelialized the bony implants per patient. Is continuing to use drops and see the ENT office regularly for wash outs.   Follow-up ultrasound on October 20, 2017 revealed a stable right ovarian mass measuring 11 x 8.5 x 7.8 cm (previously 10.6 cm in greatest dimension).  The lesion contained low-level complex homogeneous internal echogenicity.  No discrete mural nodularity was seen.  The left ovary was normal and small at 1.3 cm.  The uterus is surgically absent.  Interval Hx:  Her ear continues to slowly heal. She has continued to put off her ovarian surgery but desires moving forward due to the tenderness the mass is now causing.  Current Meds:  Outpatient Encounter Medications as of 01/15/2018  Medication Sig  . ALPRAZolam (XANAX) 0.5 MG tablet Take 1 tablet (0.5 mg total) by mouth 3 (three) times daily as needed for anxiety.  . Biotin 10000 MCG TABS Take 1 tablet by mouth daily.  . calcium carbonate (OSCAL) 1500 (600 Ca) MG TABS tablet Take 600 mg of elemental calcium by mouth daily with breakfast.  . CRANBERRY EXTRACT PO Take by mouth.  . estradiol (ESTRACE VAGINAL) 0.1 MG/GM vaginal cream Apply 1/4 applicator 3 times weekly vaginally. (Patient taking differently: Apply 1/4 applicator 3 times weekly vaginally. As needed)  . fluticasone (CUTIVATE) 0.05 % cream Apply to affected area bid prn  . HYDROcodone-acetaminophen (NORCO/VICODIN) 5-325 MG tablet 1 tab po tid prn pain  . Lactobacillus-Inulin (CULTURELLE DIGESTIVE HEALTH PO) Take 1 tablet by mouth daily as needed. GI upset  . lisinopril (PRINIVIL,ZESTRIL) 10 MG tablet Take 1 tablet (10 mg total) by mouth daily.  . meloxicam (MOBIC) 15 MG tablet TAKE 1 TABLET EVERY DAY WITH FOOD AS NEEDED FOR MUSCULOSKELETAL PAIN  . Multiple Vitamin (MULTIVITAMIN) capsule Take 1 capsule by mouth daily.  . ondansetron (ZOFRAN) 4 MG tablet Take 4 mg by mouth every 8 (eight) hours as needed for  nausea or vomiting.  Marland Kitchen POTASSIUM PO Take 1 tablet by mouth daily.   . Red Yeast Rice 600 MG CAPS Take 1 capsule by mouth daily.   Marland Kitchen terconazole (TERAZOL 7) 0.4 % vaginal cream Place 1 applicator vaginally at bedtime. For 7 nights (Patient taking differently: Place 1 applicator vaginally as needed. For 7 nights)  . verapamil (CALAN-SR) 180 MG CR tablet TAKE 1 TABLET AT BEDTIME  . vitamin B-12 (CYANOCOBALAMIN) 1000 MCG tablet Take 2,000 mcg by mouth daily.  . Vitamin D, Cholecalciferol, 1000 units CAPS Take 5,000 Units by mouth daily.  Marland Kitchen aspirin 81 MG tablet Take 81 mg by mouth daily.  Marland Kitchen CIPRODEX OTIC suspension Place 4 drops into the right ear 2 (two) times daily.  . fexofenadine (ALLEGRA) 60 MG tablet Take 1 tablet (60 mg total) by mouth 2 (two) times daily. (Patient not taking: Reported on 01/15/2018)  . fluticasone (FLONASE) 50 MCG/ACT nasal spray Place 2 sprays into both nostrils at bedtime. (Patient not taking: Reported on 01/15/2018)  . ofloxacin (FLOXIN) 0.3 % OTIC solution Place 5 drops into both ears 2 (two) times daily. For up to 7 days for ear pain or drainage.   No facility-administered encounter medications  on file as of 01/15/2018.     Allergy:  Allergies  Allergen Reactions  . Gabapentin Other (See Comments)    Elevated heart rate and crazy dreams  . Prednisone     REACTION: funny feeling    Social Hx:   Social History   Socioeconomic History  . Marital status: Married    Spouse name: [name]  . Number of children: 3  . Years of education: Not on file  . Highest education level: Not on file  Occupational History  . Occupation: retired  Scientific laboratory technician  . Financial resource strain: Not on file  . Food insecurity:    Worry: Not on file    Inability: Not on file  . Transportation needs:    Medical: Not on file    Non-medical: Not on file  Tobacco Use  . Smoking status: Never Smoker  . Smokeless tobacco: Never Used  Substance and Sexual Activity  . Alcohol  use: No    Alcohol/week: 0.0 oz  . Drug use: No  . Sexual activity: Never    Birth control/protection: Surgical    Comment: 1st intercourse 16 yo-1 partner  Lifestyle  . Physical activity:    Days per week: Not on file    Minutes per session: Not on file  . Stress: Not on file  Relationships  . Social connections:    Talks on phone: Not on file    Gets together: Not on file    Attends religious service: Not on file    Active member of club or organization: Not on file    Attends meetings of clubs or organizations: Not on file    Relationship status: Not on file  . Intimate partner violence:    Fear of current or ex partner: Not on file    Emotionally abused: Not on file    Physically abused: Not on file    Forced sexual activity: Not on file  Other Topics Concern  . Not on file  Social History Narrative   Married, has 3 children (one lives near her).  Has 8 grandchildren, 2 greatgrandchildren.   Worked cleaning apartments, worked at [employer], was a Secretary/administrator.   She retired at age 48.   No tobacco.  No alcohol.  No drugs.   Exercise: clean, work in yard.        10 siblings in all--5 have passed away--1 child age 34 with whooping cough , 1 lupus, 1 melanoma,1 lung cancer, 1 pulm fibrosis    Past Surgical Hx:  Past Surgical History:  Procedure Laterality Date  . ABDOMINAL HYSTERECTOMY     TAH  . ANTERIOR AND POSTERIOR REPAIR N/A 03/18/2014   Procedure: Cystocele repair with graft, Vault suspension, Rectocele repair;  Surgeon: [name], MD;  Location: [facility];  Service: Urology;  Laterality: N/A;  . CHOLECYSTECTOMY    . CHOLECYSTECTOMY, LAPAROSCOPIC    . COLON SURGERY    . COLONOSCOPY  04/2006; 05/26/16   2007 (Dr. [name]): Normal.  04/2016 (Dr. [name]) normal except diverticulosis and decreased anal sphincter tone.  No repeat colonoscopy is recommended due to age.  . cspine surgery     Dr. Wiliam Ke level ant cerv discectomy /fusion w/plating  . CYSTO  N/A 03/18/2014   Procedure: CYSTO;  Surgeon: [name], MD;  Location: [facility];  Service: Urology;  Laterality: N/A;  . DEXA  07/30/2015   Osteopenia--repeat 2 yrs (Dr. Reino Kent)  . RECTOCELE REPAIR     with  prolasped bledder repair  . RESECTION OF COLON     BENIGN TUMOR  . RIGHT EAR SURGERY  08/23/2017   Dr. 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 right hemicolectomy for diverticulitis with abscess  1993  . TUBAL LIGATION      Past Medical Hx:  Past Medical History:  Diagnosis Date  . Allergic rhinitis    maple pollen  . Anxiety   . Arthritis   . Bowel obstruction ([place])   . Chronic low back pain    Dr. Trenton Gammon.  Has had back injection--bp elevated after.  . Chronic pain syndrome   . DDD (degenerative disc disease), cervical    Hx of ACDF (Dr. [name]).  Followed by Dr. [name].  Also, Dr. [name] do left C7-T1 intralaminal epidural injection.  . Diverticulosis of colon   . DJD (degenerative joint disease)   . Gallstones   . GERD (gastroesophageal reflux disease)   . Herpes zoster 07/08/2014  . Hypercholesteremia    mild; pt declined statin trial 05/2014--needs recheck lipid panel at first f/u visit in 2017  . Hypertension   . Osteopenia 07/2015   T score of -1.4 FRAX15%/2%  . Ovarian cyst 2017   GYN following:  f/u imaging 02/2016 showed stability.  Her GYN referred her to [facility] for 2nd opinion 03/02/16.  Gyn onc felt surgery likely not necessary but gave pt option of elective resection---pt declined.  04/2017 f/u: pt symptomatic and to reconsider the option of surgery.  Pt more symptomatic late 2018 and surgery is planned for [id number]-robotic assisted bilatel SPO w/possible staging [facility])  . Perianal dermatitis    prn cutivate  . Phlebitis   . Right knee injury 2018   Patellofemoral crush injury--Dr. [name].  . Trochanteric  bursitis of right hip    Recurrent (injection by Dr. [name] 05/26/15)  . 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 Benjamine Mola, will likely get hearing aid.  F/u 03/2017 w/Dr. Yates Decamp sign of infection, option of TM repair or amplification--pt to think abnout it.  As of 05/2017, plan is ossiculoplasty and tympanoplasty with Dr. May on 08/30/16 [facility]).  . Varicose vein    left leg     Past Gynecological History:  SVD x 2, hysterectomy in her 20's Patient's last menstrual period was 06/27/1972.  Family Hx:  Family History  Problem Relation Age of Onset  . Hypertension Mother   . Diverticulitis Mother   . Breast cancer Sister 67  . Melanoma Brother   . Prostate cancer Brother   . Leukemia Brother   . Lupus Sister   . Lung disease Brother   . Stomach cancer Father   . Other Unknown        Family member with MGUS    Review of Systems:  Constitutional  Feels well,    ENT Normal appearing ears and nares bilaterally Skin/Breast  No rash, sores, jaundice, itching, dryness Cardiovascular  No chest pain, shortness of breath, or edema  Pulmonary  No cough or wheeze.  Gastro Intestinal  No nausea, vomitting, or diarrhoea. No bright red blood per rectum, no abdominal pain, change in bowel movement, or constipation.  Genito Urinary  No frequency, urgency, dysuria, occasional lower pelvic right sided pain Musculo Skeletal  No myalgia, arthralgia, joint swelling or pain  Neurologic  No weakness, numbness, change in gait,  Psychology  No depression, anxiety, insomnia.   Vitals:  Blood pressure (!) 156/78, pulse (!) 58, temperature 97.9 F (36.6 C), temperature source Oral, resp. rate 20, height 5\' 1"  (1.549 m), weight 130 lb 1.6 oz (59 kg), last menstrual period 06/27/1972, SpO2 99 %.  Physical Exam: WD in NAD Neck  Supple NROM, without any enlargements.  Lymph Node Survey No cervical supraclavicular or inguinal adenopathy Cardiovascular   Pulse normal rate, regularity and rhythm. S1 and S2 normal.  Lungs  Clear to auscultation bilateraly, without wheezes/crackles/rhonchi. Good air movement.  Skin  No rash/lesions/breakdown  Psychiatry  Alert and oriented to person, place, and time  Abdomen  Normoactive bowel sounds, abdomen soft, non-tender and thin without evidence of hernia.  Back No CVA tenderness Genito Urinary  Vulva/vagina: Normal external female genitalia.  No lesions. No discharge or bleeding.  Bladder/urethra:  No lesions or masses, well supported bladder  Vagina: normal  Cervix: surgically absent  Uterus: surgically absent   Adnexa: can appreciate a smooth walled mass on the mid pelvis, approximately 12cm, minimally mobile but not apparently attached to the vaginal cuff or rectum. Tender on exam. Rectal  Good tone, no masses no cul de sac nodularity.  Extremities  No bilateral cyanosis, clubbing or edema.   Thereasa Solo, MD  01/15/2018, 4:54 PM

## 2018-01-15 NOTE — H&P (View-Only) (Signed)
Follow-up Note: Gyn-Onc  Consult was requested by Dr. Phineas Real for the evaluation of Alison Cuevas 72 y.o. female  CC:  Chief Complaint  Patient presents with  . Ovarian mass    Assessment/Plan:  [name]  is a 72 y.o.  year old with a moderately large (11cm) right ovarian mostly simple benign appearing cyst associated with a normal CA 125. It now appears to be symptomatic. She has a persistent issue of right ear healing issues post tympanoplasy.   We are planning a robotic assisted BSO with possible staging. I discussed surgical recovery and surgical risk including  bleeding, infection, damage to internal organs (such as bladder,ureters, bowels), blood clot, reoperation and rehospitalization.  Her ENT was notified of the surgery and approves the use of trendelenberg positioning.  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 Phineas Real for a large right ovarian cysts.  The patient has a long-standing history of bladder prolapse and recurrent UTIs for which she was being treated by Dr.MacDiarmid at [facility]. Due to the persistent nature of her symptoms she underwent a CT scan of the abdomen and pelvis to workup this issue further that was performed on 10/29/2015. It revealed an incidental finding of a 7.9 cm right pelvic cystic structure.  Follow-up the evaluation was performed with a repeat ultrasound on 03/02/2016. This revealed the cyst to measure a 0.5 cm in largest dimension with a second smaller cyst within it measuring 13 x 12 mm. Left ovary was normal. The uterus was surgically absent. A CA-125 was drawn and was normal at 6.  The patient reports intermittent sharp right flank and lower abdominal pains. Is unclear what these are associated with, she thinks maybe with straining or lifting she notices some more. She is chronic back pain issues. She's also had a significant surgical history for a  laparotomy with hemicolectomy, laparoscopic cholecystectomy, and total abdominal hysterectomy in her 20s for fibroid uterus and pelvic pain.  She is no concerning family history that she has a likely genetic predisposition to ovarian or breast cancer.  She had an excellent performance status she takes aspirin.  The patient elected for expectant management as she was worried about the convalescence associated with surgical recovery. In January, 2018 she began experiencing intermittent severe abdominal pains. Repeat US in March, 2018 showed a stable 9cm ovarian cyst with homogeneous low level internal echoes and stable small eccentric thin walled 1.1cm daughter cyst. No vascularity or mural nodules.  CA 125 on 08/22/16 was normal and stable at 8.4.  The patient elected for expectant management because she did not want to manage a surgical convalescence while managing her husband's health issues.  In the meantime she developed a severe right ear problem which included erosion of the ear drum and middle ear. This is being managed by an ENT with a plan for reconstruction in March, 2019. This is causing her substantial pain, and anxiety.  She began developing increasing lower abdominal pains in October, 2018. She saw Dr Phineas Real who was concerned that now the cyst was symptomatic she might be interested in its removal.  She wanted to hold off having surgery for the ovarian mass while she has having major concerns with her ear drum.  Repeat US of the pelvis on 05/25/17 showed a cystic lesion is again seen in the right adnexa which shows low level internal echoes and a tiny peripheral mural cystic structure. No  mural solid nodules or internal septations. This measures 10.6 x 7.1 x 10.6 cm and shows mild increase in size from approximately 8 cm on previous studies dating back to 2017. This has indeterminate but probably benign characteristics.   She is s/p tympanoplasty reconstruction surgery on 08/23/17  with Dr May.  She has had some slow healing and has not yet re-epthelialized the bony implants per patient. Is continuing to use drops and see the ENT office regularly for wash outs.   Follow-up ultrasound on October 20, 2017 revealed a stable right ovarian mass measuring 11 x 8.5 x 7.8 cm (previously 10.6 cm in greatest dimension).  The lesion contained low-level complex homogeneous internal echogenicity.  No discrete mural nodularity was seen.  The left ovary was normal and small at 1.3 cm.  The uterus is surgically absent.  Interval Hx:  Her ear continues to slowly heal. She has continued to put off her ovarian surgery but desires moving forward due to the tenderness the mass is now causing.  Current Meds:  Outpatient Encounter Medications as of 01/15/2018  Medication Sig  . ALPRAZolam (XANAX) 0.5 MG tablet Take 1 tablet (0.5 mg total) by mouth 3 (three) times daily as needed for anxiety.  . Biotin 10000 MCG TABS Take 1 tablet by mouth daily.  . calcium carbonate (OSCAL) 1500 (600 Ca) MG TABS tablet Take 600 mg of elemental calcium by mouth daily with breakfast.  . CRANBERRY EXTRACT PO Take by mouth.  . estradiol (ESTRACE VAGINAL) 0.1 MG/GM vaginal cream Apply 1/4 applicator 3 times weekly vaginally. (Patient taking differently: Apply 1/4 applicator 3 times weekly vaginally. As needed)  . fluticasone (CUTIVATE) 0.05 % cream Apply to affected area bid prn  . HYDROcodone-acetaminophen (NORCO/VICODIN) 5-325 MG tablet 1 tab po tid prn pain  . Lactobacillus-Inulin (CULTURELLE DIGESTIVE HEALTH PO) Take 1 tablet by mouth daily as needed. GI upset  . lisinopril (PRINIVIL,ZESTRIL) 10 MG tablet Take 1 tablet (10 mg total) by mouth daily.  . meloxicam (MOBIC) 15 MG tablet TAKE 1 TABLET EVERY DAY WITH FOOD AS NEEDED FOR MUSCULOSKELETAL PAIN  . Multiple Vitamin (MULTIVITAMIN) capsule Take 1 capsule by mouth daily.  . ondansetron (ZOFRAN) 4 MG tablet Take 4 mg by mouth every 8 (eight) hours as needed for  nausea or vomiting.  Marland Kitchen POTASSIUM PO Take 1 tablet by mouth daily.   . Red Yeast Rice 600 MG CAPS Take 1 capsule by mouth daily.   Marland Kitchen terconazole (TERAZOL 7) 0.4 % vaginal cream Place 1 applicator vaginally at bedtime. For 7 nights (Patient taking differently: Place 1 applicator vaginally as needed. For 7 nights)  . verapamil (CALAN-SR) 180 MG CR tablet TAKE 1 TABLET AT BEDTIME  . vitamin B-12 (CYANOCOBALAMIN) 1000 MCG tablet Take 2,000 mcg by mouth daily.  . Vitamin D, Cholecalciferol, 1000 units CAPS Take 5,000 Units by mouth daily.  Marland Kitchen aspirin 81 MG tablet Take 81 mg by mouth daily.  Marland Kitchen CIPRODEX OTIC suspension Place 4 drops into the right ear 2 (two) times daily.  . fexofenadine (ALLEGRA) 60 MG tablet Take 1 tablet (60 mg total) by mouth 2 (two) times daily. (Patient not taking: Reported on 01/15/2018)  . fluticasone (FLONASE) 50 MCG/ACT nasal spray Place 2 sprays into both nostrils at bedtime. (Patient not taking: Reported on 01/15/2018)  . ofloxacin (FLOXIN) 0.3 % OTIC solution Place 5 drops into both ears 2 (two) times daily. For up to 7 days for ear pain or drainage.   No facility-administered encounter medications  on file as of 01/15/2018.     Allergy:  Allergies  Allergen Reactions  . Gabapentin Other (See Comments)    Elevated heart rate and crazy dreams  . Prednisone     REACTION: funny feeling    Social Hx:   Social History   Socioeconomic History  . Marital status: Married    Spouse name: [name]  . Number of children: 3  . Years of education: Not on file  . Highest education level: Not on file  Occupational History  . Occupation: retired  Scientific laboratory technician  . Financial resource strain: Not on file  . Food insecurity:    Worry: Not on file    Inability: Not on file  . Transportation needs:    Medical: Not on file    Non-medical: Not on file  Tobacco Use  . Smoking status: Never Smoker  . Smokeless tobacco: Never Used  Substance and Sexual Activity  . Alcohol  use: No    Alcohol/week: 0.0 oz  . Drug use: No  . Sexual activity: Never    Birth control/protection: Surgical    Comment: 1st intercourse 59 yo-1 partner  Lifestyle  . Physical activity:    Days per week: Not on file    Minutes per session: Not on file  . Stress: Not on file  Relationships  . Social connections:    Talks on phone: Not on file    Gets together: Not on file    Attends religious service: Not on file    Active member of club or organization: Not on file    Attends meetings of clubs or organizations: Not on file    Relationship status: Not on file  . Intimate partner violence:    Fear of current or ex partner: Not on file    Emotionally abused: Not on file    Physically abused: Not on file    Forced sexual activity: Not on file  Other Topics Concern  . Not on file  Social History Narrative   Married, has 3 children (one lives near her).  Has 8 grandchildren, 2 greatgrandchildren.   Worked cleaning apartments, worked at [employer], was a Secretary/administrator.   She retired at age 15.   No tobacco.  No alcohol.  No drugs.   Exercise: clean, work in yard.        10 siblings in all--5 have passed away--1 child age 44 with whooping cough , 1 lupus, 1 melanoma,1 lung cancer, 1 pulm fibrosis    Past Surgical Hx:  Past Surgical History:  Procedure Laterality Date  . ABDOMINAL HYSTERECTOMY     TAH  . ANTERIOR AND POSTERIOR REPAIR N/A 03/18/2014   Procedure: Cystocele repair with graft, Vault suspension, Rectocele repair;  Surgeon: [name], MD;  Location: [facility];  Service: Urology;  Laterality: N/A;  . CHOLECYSTECTOMY    . CHOLECYSTECTOMY, LAPAROSCOPIC    . COLON SURGERY    . COLONOSCOPY  04/2006; 05/26/16   2007 (Dr. [name]): Normal.  04/2016 (Dr. [name]) normal except diverticulosis and decreased anal sphincter tone.  No repeat colonoscopy is recommended due to age.  . cspine surgery     Dr. Wiliam Ke level ant cerv discectomy /fusion w/plating  . CYSTO  N/A 03/18/2014   Procedure: CYSTO;  Surgeon: [name], MD;  Location: [facility];  Service: Urology;  Laterality: N/A;  . DEXA  07/30/2015   Osteopenia--repeat 2 yrs (Dr. Reino Kent)  . RECTOCELE REPAIR     with  prolasped bledder repair  . RESECTION OF COLON     BENIGN TUMOR  . RIGHT EAR SURGERY  08/23/2017   Dr. 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 right hemicolectomy for diverticulitis with abscess  1993  . TUBAL LIGATION      Past Medical Hx:  Past Medical History:  Diagnosis Date  . Allergic rhinitis    maple pollen  . Anxiety   . Arthritis   . Bowel obstruction ([place])   . Chronic low back pain    Dr. Trenton Gammon.  Has had back injection--bp elevated after.  . Chronic pain syndrome   . DDD (degenerative disc disease), cervical    Hx of ACDF (Dr. [name]).  Followed by Dr. [name].  Also, Dr. [name] do left C7-T1 intralaminal epidural injection.  . Diverticulosis of colon   . DJD (degenerative joint disease)   . Gallstones   . GERD (gastroesophageal reflux disease)   . Herpes zoster 07/08/2014  . Hypercholesteremia    mild; pt declined statin trial 05/2014--needs recheck lipid panel at first f/u visit in 2017  . Hypertension   . Osteopenia 07/2015   T score of -1.4 FRAX15%/2%  . Ovarian cyst 2017   GYN following:  f/u imaging 02/2016 showed stability.  Her GYN referred her to [facility] for 2nd opinion 03/02/16.  Gyn onc felt surgery likely not necessary but gave pt option of elective resection---pt declined.  04/2017 f/u: pt symptomatic and to reconsider the option of surgery.  Pt more symptomatic late 2018 and surgery is planned for 92019-robotic assisted bilatel SPO w/possible staging [facility])  . Perianal dermatitis    prn cutivate  . Phlebitis   . Right knee injury 2018   Patellofemoral crush injury--Dr. [name].  . Trochanteric  bursitis of right hip    Recurrent (injection by Dr. [name] 05/26/15)  . 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 Benjamine Mola, will likely get hearing aid.  F/u 03/2017 w/Dr. Yates Decamp sign of infection, option of TM repair or amplification--pt to think abnout it.  As of 05/2017, plan is ossiculoplasty and tympanoplasty with Dr. May on 08/30/16 [facility]).  . Varicose vein    left leg     Past Gynecological History:  SVD x 2, hysterectomy in her 20's Patient's last menstrual period was 06/27/1972.  Family Hx:  Family History  Problem Relation Age of Onset  . Hypertension Mother   . Diverticulitis Mother   . Breast cancer Sister 6  . Melanoma Brother   . Prostate cancer Brother   . Leukemia Brother   . Lupus Sister   . Lung disease Brother   . Stomach cancer Father   . Other Unknown        Family member with MGUS    Review of Systems:  Constitutional  Feels well,    ENT Normal appearing ears and nares bilaterally Skin/Breast  No rash, sores, jaundice, itching, dryness Cardiovascular  No chest pain, shortness of breath, or edema  Pulmonary  No cough or wheeze.  Gastro Intestinal  No nausea, vomitting, or diarrhoea. No bright red blood per rectum, no abdominal pain, change in bowel movement, or constipation.  Genito Urinary  No frequency, urgency, dysuria, occasional lower pelvic right sided pain Musculo Skeletal  No myalgia, arthralgia, joint swelling or pain  Neurologic  No weakness, numbness, change in gait,  Psychology  No depression, anxiety, insomnia.   Vitals:  Blood pressure (!) 156/78, pulse (!) 58, temperature 97.9 F (36.6 C), temperature source Oral, resp. rate 20, height 5\' 1"  (1.549 m), weight 130 lb 1.6 oz (59 kg), last menstrual period 06/27/1972, SpO2 99 %.  Physical Exam: WD in NAD Neck  Supple NROM, without any enlargements.  Lymph Node Survey No cervical supraclavicular or inguinal adenopathy Cardiovascular   Pulse normal rate, regularity and rhythm. S1 and S2 normal.  Lungs  Clear to auscultation bilateraly, without wheezes/crackles/rhonchi. Good air movement.  Skin  No rash/lesions/breakdown  Psychiatry  Alert and oriented to person, place, and time  Abdomen  Normoactive bowel sounds, abdomen soft, non-tender and thin without evidence of hernia.  Back No CVA tenderness Genito Urinary  Vulva/vagina: Normal external female genitalia.  No lesions. No discharge or bleeding.  Bladder/urethra:  No lesions or masses, well supported bladder  Vagina: normal  Cervix: surgically absent  Uterus: surgically absent   Adnexa: can appreciate a smooth walled mass on the mid pelvis, approximately 12cm, minimally mobile but not apparently attached to the vaginal cuff or rectum. Tender on exam. Rectal  Good tone, no masses no cul de sac nodularity.  Extremities  No bilateral cyanosis, clubbing or edema.   Thereasa Solo, MD  01/15/2018, 4:54 PM

## 2018-01-16 DIAGNOSIS — Z9889 Other specified postprocedural states: Secondary | ICD-10-CM | POA: Diagnosis not present

## 2018-01-16 DIAGNOSIS — H9211 Otorrhea, right ear: Secondary | ICD-10-CM | POA: Diagnosis not present

## 2018-01-16 DIAGNOSIS — H7291 Unspecified perforation of tympanic membrane, right ear: Secondary | ICD-10-CM | POA: Diagnosis not present

## 2018-01-17 ENCOUNTER — Encounter (HOSPITAL_COMMUNITY): Payer: Self-pay | Admitting: *Deleted

## 2018-01-18 ENCOUNTER — Telehealth: Payer: Self-pay | Admitting: *Deleted

## 2018-01-18 DIAGNOSIS — L720 Epidermal cyst: Secondary | ICD-10-CM | POA: Diagnosis not present

## 2018-01-18 DIAGNOSIS — I8310 Varicose veins of unspecified lower extremity with inflammation: Secondary | ICD-10-CM | POA: Diagnosis not present

## 2018-01-18 DIAGNOSIS — L255 Unspecified contact dermatitis due to plants, except food: Secondary | ICD-10-CM | POA: Diagnosis not present

## 2018-01-18 DIAGNOSIS — L658 Other specified nonscarring hair loss: Secondary | ICD-10-CM | POA: Diagnosis not present

## 2018-01-18 NOTE — Telephone Encounter (Signed)
Patient called asking about a IGM protein test, states that her sister is receiving this and wanted to know if she needed this, since she is going to pre-op today. Test not indicated for primary diagnosis right ovarian cyst., per Joylene John, NP.   If concerns continue I suggested the patient contact the PCP. Patient verbalized understanding.

## 2018-01-19 ENCOUNTER — Ambulatory Visit: Payer: Medicare Other | Admitting: Gynecologic Oncology

## 2018-01-22 NOTE — Patient Instructions (Signed)
Alison Cuevas  01/22/2018   Your procedure is scheduled on: 01-31-18   Report to [facility] Main  Entrance    Report to admitting at 9:30AM    Call this number if you have problems the morning of surgery [phone]     Remember: Eat a light diet the day before surgery. Examples including soups, broths, toast, yogurt, mashed potatoes. Things to avoid include carbonated beverages (fizzy beverages), raw fruits and raw vegetables, or beans.   Only clear liquids after midnight. Nothing by mouth 3 hours prior to scheduled surgery. Please finish carbohydrate drink 3 hours prior to surgery at 8:30AM!        [place] Excluded  Coffee and tea, regular and decaf                             liquids that you cannot  Plain Jell-O in any flavor                                             see through such as: Fruit ices (not with fruit pulp)                                     milk, soups, orange juice  Iced Popsicles                                    All solid food Carbonated beverages, regular and diet                                    Cranberry, grape and apple juices Sports drinks like Gatorade Lightly seasoned clear broth or consume(fat free) Sugar, honey syrup  Sample Menu Breakfast                                Lunch                                     Supper Cranberry juice                    Beef broth                            Chicken broth Jell-O  Grape juice                           Apple juice Coffee or tea                        Jell-O                                      Popsicle                                                Coffee or tea                        Coffee or tea  _____________________________________________________________________     Take these medicines the morning of surgery with A SIP OF  WATER: xanax if needed, allegra                                You may not have any metal on your body including hair pins and              piercings  Do not wear jewelry, make-up, lotions, powders or perfumes, deodorant             Do not wear nail polish.  Do not shave  48 hours prior to surgery.                 Do not bring valuables to the hospital. [place].  Contacts, dentures or bridgework may not be worn into surgery.  Leave suitcase in the car. After surgery it may be brought to your room.                Please read over the following fact sheets you were given: _____________________________________________________________________             [facility] - Preparing for Surgery Before surgery, you can play an important role.  Because skin is not sterile, your skin needs to be as free of germs as possible.  You can reduce the number of germs on your skin by washing with CHG (chlorahexidine gluconate) soap before surgery.  CHG is an antiseptic cleaner which kills germs and bonds with the skin to continue killing germs even after washing. Please DO NOT use if you have an allergy to CHG or antibacterial soaps.  If your skin becomes reddened/irritated stop using the CHG and inform your nurse when you arrive at Short Stay. Do not shave (including legs and underarms) for at least 48 hours prior to the first CHG shower.  You may shave your face/neck. Please follow these instructions carefully:  1.  Shower with CHG Soap the night before surgery and the  morning of Surgery.  2.  If you choose to wash your hair, wash your hair first as usual with your  normal  shampoo.  3.  After you shampoo, rinse your hair and body thoroughly to remove the  shampoo.  4.  Use CHG as you would any other liquid soap.  You can apply chg directly  to the skin and wash                       Gently with a scrungie or clean washcloth.  5.   Apply the CHG Soap to your body ONLY FROM THE NECK DOWN.   Do not use on face/ open                           Wound or open sores. Avoid contact with eyes, ears mouth and genitals (private parts).                       Wash face,  Genitals (private parts) with your normal soap.             6.  Wash thoroughly, paying special attention to the area where your surgery  will be performed.  7.  Thoroughly rinse your body with warm water from the neck down.  8.  DO NOT shower/wash with your normal soap after using and rinsing off  the CHG Soap.                9.  Pat yourself dry with a clean towel.            10.  Wear clean pajamas.            11.  Place clean sheets on your bed the night of your first shower and do not  sleep with pets. Day of Surgery : Do not apply any lotions/deodorants the morning of surgery.  Please wear clean clothes to the hospital/surgery center.  FAILURE TO FOLLOW THESE INSTRUCTIONS MAY RESULT IN THE CANCELLATION OF YOUR SURGERY PATIENT SIGNATURE_________________________________  NURSE SIGNATURE__________________________________  ________________________________________________________________________   Alison Cuevas  An incentive spirometer is a tool that can help keep your lungs clear and active. This tool measures how well you are filling your lungs with each breath. Taking long deep breaths may help reverse or decrease the chance of developing breathing (pulmonary) problems (especially infection) following:  A long period of time when you are unable to move or be active. BEFORE THE PROCEDURE   If the spirometer includes an indicator to show your best effort, your nurse or respiratory therapist will set it to a desired goal.  If possible, sit up straight or lean slightly forward. Try not to slouch.  Hold the incentive spirometer in an upright position. INSTRUCTIONS FOR USE  1. Sit on the edge of your bed if possible, or sit up as far as you can in bed  or on a chair. 2. Hold the incentive spirometer in an upright position. 3. Breathe out normally. 4. Place the mouthpiece in your mouth and seal your lips tightly around it. 5. Breathe in slowly and as deeply as possible, raising the piston or the ball toward the top of the column. 6. Hold your breath for 3-5 seconds or for as long as possible. Allow the piston or ball to fall to the bottom of the column. 7. Remove the mouthpiece from your mouth and breathe out normally. 8. Rest for a few seconds and repeat Steps 1 through 7 at least 10 times every 1-2 hours when you are awake. Take your time and take a few normal breaths between deep breaths. 9. The spirometer may include an indicator to  show your best effort. Use the indicator as a goal to work toward during each repetition. 10. After each set of 10 deep breaths, practice coughing to be sure your lungs are clear. If you have an incision (the cut made at the time of surgery), support your incision when coughing by placing a pillow or rolled up towels firmly against it. Once you are able to get out of bed, walk around indoors and cough well. You may stop using the incentive spirometer when instructed by your caregiver.  RISKS AND COMPLICATIONS  Take your time so you do not get dizzy or light-headed.  If you are in pain, you may need to take or ask for pain medication before doing incentive spirometry. It is harder to take a deep breath if you are having pain. AFTER USE  Rest and breathe slowly and easily.  It can be helpful to keep track of a log of your progress. Your caregiver can provide you with a simple table to help with this. If you are using the spirometer at home, follow these instructions: [place] IF:   You are having difficultly using the spirometer.  You have trouble using the spirometer as often as instructed.  Your pain medication is not giving enough relief while using the spirometer.  You develop fever of 100.5  F (38.1 C) or higher. SEEK IMMEDIATE MEDICAL CARE IF:   You cough up bloody sputum that had not been present before.  You develop fever of 102 F (38.9 C) or greater.  You develop worsening pain at or near the incision site. MAKE SURE YOU:   Understand these instructions.  Will watch your condition.  Will get help right away if you are not doing well or get worse. Document Released: 10/24/2006 Document Revised: 09/05/2011 Document Reviewed: 12/25/2006 [employer] Patient Information 2014 [employer], [place].   ________________________________________________________________________  WHAT IS A BLOOD TRANSFUSION? Blood Transfusion Information  A transfusion is the replacement of blood or some of its parts. Blood is made up of multiple cells which provide different functions.  Red blood cells carry oxygen and are used for blood loss replacement.  White blood cells fight against infection.  Platelets control bleeding.  Plasma helps clot blood.  Other blood products are available for specialized needs, such as hemophilia or other clotting disorders. BEFORE THE TRANSFUSION  Who gives blood for transfusions?   Healthy volunteers who are fully evaluated to make sure their blood is safe. This is blood bank blood. Transfusion therapy is the safest it has ever been in the practice of medicine. Before blood is taken from a donor, a complete history is taken to make sure that person has no history of diseases nor engages in risky social behavior (examples are intravenous drug use or sexual activity with multiple partners). The donor's travel history is screened to minimize risk of transmitting infections, such as malaria. The donated blood is tested for signs of infectious diseases, such as HIV and hepatitis. The blood is then tested to be sure it is compatible with you in order to minimize the chance of a transfusion reaction. If you or a relative donates blood, this is often done in  anticipation of surgery and is not appropriate for emergency situations. It takes many days to process the donated blood. RISKS AND COMPLICATIONS Although transfusion therapy is very safe and saves many lives, the main dangers of transfusion include:   Getting an infectious disease.  Developing a transfusion reaction. This is an allergic reaction  to something in the blood you were given. Every precaution is taken to prevent this. The decision to have a blood transfusion has been considered carefully by your caregiver before blood is given. Blood is not given unless the benefits outweigh the risks. AFTER THE TRANSFUSION  Right after receiving a blood transfusion, you will usually feel much better and more energetic. This is especially true if your red blood cells have gotten low (anemic). The transfusion raises the level of the red blood cells which carry oxygen, and this usually causes an energy increase.  The nurse administering the transfusion will monitor you carefully for complications. HOME CARE INSTRUCTIONS  No special instructions are needed after a transfusion. You may find your energy is better. Speak with your caregiver about any limitations on activity for underlying diseases you may have. SEEK MEDICAL CARE IF:   Your condition is not improving after your transfusion.  You develop redness or irritation at the intravenous (IV) site. SEEK IMMEDIATE MEDICAL CARE IF:  Any of the following symptoms occur over the next 12 hours:  Shaking chills.  You have a temperature by mouth above 102 F (38.9 C), not controlled by medicine.  Chest, back, or muscle pain.  People around you feel you are not acting correctly or are confused.  Shortness of breath or difficulty breathing.  Dizziness and fainting.  You get a rash or develop hives.  You have a decrease in urine output.  Your urine turns a dark color or changes to pink, red, or brown. Any of the following symptoms occur over  the next 10 days:  You have a temperature by mouth above 102 F (38.9 C), not controlled by medicine.  Shortness of breath.  Weakness after normal activity.  The white part of the eye turns yellow (jaundice).  You have a decrease in the amount of urine or are urinating less often.  Your urine turns a dark color or changes to pink, red, or brown. Document Released: 06/10/2000 Document Revised: 09/05/2011 Document Reviewed: 01/28/2008 [facility] Patient Information 2014 [place], [place].  _______________________________________________________________________

## 2018-01-22 NOTE — Progress Notes (Signed)
ekg 08-31-17 epic

## 2018-01-24 ENCOUNTER — Encounter (HOSPITAL_COMMUNITY): Payer: Self-pay

## 2018-01-24 ENCOUNTER — Other Ambulatory Visit: Payer: Self-pay

## 2018-01-24 ENCOUNTER — Encounter (HOSPITAL_COMMUNITY)
Admission: RE | Admit: 2018-01-24 | Discharge: 2018-01-24 | Disposition: A | Discharge status: Home / Self Care | Payer: Medicare Other | Source: Ambulatory Visit | Attending: Gynecologic Oncology | Admitting: Gynecologic Oncology

## 2018-01-24 DIAGNOSIS — N83201 Unspecified ovarian cyst, right side: Secondary | ICD-10-CM | POA: Insufficient documentation

## 2018-01-24 DIAGNOSIS — Z01818 Encounter for other preprocedural examination: Secondary | ICD-10-CM | POA: Diagnosis not present

## 2018-01-24 LAB — CBC
HCT: 38.5 % (ref 36.0–46.0)
Hemoglobin: 13.5 g/dL (ref 12.0–15.0)
MCH: 30.3 pg (ref 26.0–34.0)
MCHC: 35.1 g/dL (ref 30.0–36.0)
MCV: 86.5 fL (ref 78.0–100.0)
Platelets: 189 10*3/uL (ref 150–400)
RBC: 4.45 MIL/uL (ref 3.87–5.11)
RDW: 12.9 % (ref 11.5–15.5)
WBC: 5.4 10*3/uL (ref 4.0–10.5)

## 2018-01-24 LAB — COMPREHENSIVE METABOLIC PANEL
ALT: 21 U/L (ref 0–44)
AST: 29 U/L (ref 15–41)
Albumin: 4.6 g/dL (ref 3.5–5.0)
Alkaline Phosphatase: 67 U/L (ref 38–126)
Anion gap: 7 (ref 5–15)
BUN: 13 mg/dL (ref 8–23)
CO2: 28 mmol/L (ref 22–32)
Calcium: 9.5 mg/dL (ref 8.9–10.3)
Chloride: 103 mmol/L (ref 98–111)
Creatinine, Ser: 0.74 mg/dL (ref 0.44–1.00)
GFR calc Af Amer: >60 mL/min (ref >60)
GFR calc non Af Amer: >60 mL/min (ref >60)
Glucose, Bld: 99 mg/dL (ref 70–99)
Potassium: 4.1 mmol/L (ref 3.5–5.1)
Sodium: 138 mmol/L (ref 135–145)
Total Bilirubin: 1.1 mg/dL (ref 0.3–1.2)
Total Protein: 8.1 g/dL (ref 6.5–8.1)

## 2018-01-25 DIAGNOSIS — H7291 Unspecified perforation of tympanic membrane, right ear: Secondary | ICD-10-CM | POA: Diagnosis not present

## 2018-01-25 DIAGNOSIS — Z9889 Other specified postprocedural states: Secondary | ICD-10-CM | POA: Diagnosis not present

## 2018-01-25 DIAGNOSIS — H9211 Otorrhea, right ear: Secondary | ICD-10-CM | POA: Diagnosis not present

## 2018-01-25 LAB — CEA: CEA: 0.7 ng/mL (ref 0.0–4.7)

## 2018-01-25 LAB — CA 125: Cancer Antigen (CA) 125: 7.8 U/mL (ref 0.0–38.1)

## 2018-01-26 ENCOUNTER — Other Ambulatory Visit: Payer: Self-pay

## 2018-01-26 ENCOUNTER — Inpatient Hospital Stay (HOSPITAL_COMMUNITY): Admission: RE | Admit: 2018-01-26 | Payer: Medicare Other | Source: Ambulatory Visit

## 2018-01-26 ENCOUNTER — Ambulatory Visit (INDEPENDENT_AMBULATORY_CARE_PROVIDER_SITE_OTHER): Payer: Medicare Other

## 2018-01-26 VITALS — BP 148/72 | HR 59 | Ht 61.0 in | Wt 127.1 lb

## 2018-01-26 DIAGNOSIS — Z0000 Encounter for general adult medical examination without abnormal findings: Secondary | ICD-10-CM

## 2018-01-26 DIAGNOSIS — E2839 Other primary ovarian failure: Secondary | ICD-10-CM | POA: Diagnosis not present

## 2018-01-26 NOTE — Patient Instructions (Addendum)
Schedule bone scan in December with mammogram.   Continue doing brain stimulating activities (puzzles, reading, adult coloring books, staying active) to keep memory sharp.   Health Maintenance, Female Adopting a healthy lifestyle and getting preventive care can go a long way to promote health and wellness. Talk with your health care provider about what schedule of regular examinations is right for you. This is a good chance for you to check in with your provider about disease prevention and staying healthy. In between checkups, there are plenty of things you can do on your own. Experts have done a lot of research about which lifestyle changes and preventive measures are most likely to keep you healthy. Ask your health care provider for more information. Weight and diet Eat a healthy diet  Be sure to include plenty of vegetables, fruits, low-fat dairy products, and lean protein.  Do not eat a lot of foods high in solid fats, added sugars, or salt.  Get regular exercise. This is one of the most important things you can do for your health. ? Most adults should exercise for at least 150 minutes each week. The exercise should increase your heart rate and make you sweat (moderate-intensity exercise). ? Most adults should also do strengthening exercises at least twice a week. This is in addition to the moderate-intensity exercise.  Maintain a healthy weight  Body mass index (BMI) is a measurement that can be used to identify possible weight problems. It estimates body fat based on height and weight. Your health care provider can help determine your BMI and help you achieve or maintain a healthy weight.  For females 34 years of age and older: ? A BMI below 18.5 is considered underweight. ? A BMI of 18.5 to 24.9 is normal. ? A BMI of 25 to 29.9 is considered overweight. ? A BMI of 30 and above is considered obese.  Watch levels of cholesterol and blood lipids  You should start having your blood  tested for lipids and cholesterol at 73 years of age, then have this test every 5 years.  You may need to have your cholesterol levels checked more often if: ? Your lipid or cholesterol levels are high. ? You are older than 73 years of age. ? You are at high risk for heart disease.  Cancer screening Lung Cancer  Lung cancer screening is recommended for adults 29-71 years old who are at high risk for lung cancer because of a history of smoking.  A yearly low-dose CT scan of the lungs is recommended for people who: ? Currently smoke. ? Have quit within the past 15 years. ? Have at least a 30-pack-year history of smoking. A pack year is smoking an average of one pack of cigarettes a day for 1 year.  Yearly screening should continue until it has been 15 years since you quit.  Yearly screening should stop if you develop a health problem that would prevent you from having lung cancer treatment.  Breast Cancer  Practice breast self-awareness. This means understanding how your breasts normally appear and feel.  It also means doing regular breast self-exams. Let your health care provider know about any changes, no matter how small.  If you are in your 20s or 30s, you should have a clinical breast exam (CBE) by a health care provider every 1-3 years as part of a regular health exam.  If you are 41 or older, have a CBE every year. Also consider having a breast X-ray (mammogram) every  year.  If you have a family history of breast cancer, talk to your health care provider about genetic screening.  If you are at high risk for breast cancer, talk to your health care provider about having an MRI and a mammogram every year.  Breast cancer gene (BRCA) assessment is recommended for women who have family members with BRCA-related cancers. BRCA-related cancers include: ? Breast. ? Ovarian. ? Tubal. ? Peritoneal cancers.  Results of the assessment will determine the need for genetic counseling and  BRCA1 and BRCA2 testing.  Cervical Cancer Your health care provider may recommend that you be screened regularly for cancer of the pelvic organs (ovaries, uterus, and vagina). This screening involves a pelvic examination, including checking for microscopic changes to the surface of your cervix (Pap test). You may be encouraged to have this screening done every 3 years, beginning at age 21.  For women ages 52-65, health care providers may recommend pelvic exams and Pap testing every 3 years, or they may recommend the Pap and pelvic exam, combined with testing for human papilloma virus (HPV), every 5 years. Some types of HPV increase your risk of cervical cancer. Testing for HPV may also be done on women of any age with unclear Pap test results.  Other health care providers may not recommend any screening for nonpregnant women who are considered low risk for pelvic cancer and who do not have symptoms. Ask your health care provider if a screening pelvic exam is right for you.  If you have had past treatment for cervical cancer or a condition that could lead to cancer, you need Pap tests and screening for cancer for at least 20 years after your treatment. If Pap tests have been discontinued, your risk factors (such as having a new sexual partner) need to be reassessed to determine if screening should resume. Some women have medical problems that increase the chance of getting cervical cancer. In these cases, your health care provider may recommend more frequent screening and Pap tests.  Colorectal Cancer  This type of cancer can be detected and often prevented.  Routine colorectal cancer screening usually begins at 73 years of age and continues through 73 years of age.  Your health care provider may recommend screening at an earlier age if you have risk factors for colon cancer.  Your health care provider may also recommend using home test kits to check for hidden blood in the stool.  A small camera  at the end of a tube can be used to examine your colon directly (sigmoidoscopy or colonoscopy). This is done to check for the earliest forms of colorectal cancer.  Routine screening usually begins at age 22.  Direct examination of the colon should be repeated every 5-10 years through 73 years of age. However, you may need to be screened more often if early forms of precancerous polyps or small growths are found.  Skin Cancer  Check your skin from head to toe regularly.  Tell your health care provider about any new moles or changes in moles, especially if there is a change in a mole's shape or color.  Also tell your health care provider if you have a mole that is larger than the size of a pencil eraser.  Always use sunscreen. Apply sunscreen liberally and repeatedly throughout the day.  Protect yourself by wearing long sleeves, pants, a wide-brimmed hat, and sunglasses whenever you are outside.  Heart disease, diabetes, and high blood pressure  High blood pressure causes heart  disease and increases the risk of stroke. High blood pressure is more likely to develop in: ? People who have blood pressure in the high end of the normal range (130-139/85-89 mm Hg). ? People who are overweight or obese. ? People who are African American.  If you are 40-19 years of age, have your blood pressure checked every 3-5 years. If you are 35 years of age or older, have your blood pressure checked every year. You should have your blood pressure measured twice-once when you are at a hospital or clinic, and once when you are not at a hospital or clinic. Record the average of the two measurements. To check your blood pressure when you are not at a hospital or clinic, you can use: ? An automated blood pressure machine at a pharmacy. ? A home blood pressure monitor.  If you are between 89 years and 69 years old, ask your health care provider if you should take aspirin to prevent strokes.  Have regular diabetes  screenings. This involves taking a blood sample to check your fasting blood sugar level. ? If you are at a normal weight and have a low risk for diabetes, have this test once every three years after 73 years of age. ? If you are overweight and have a high risk for diabetes, consider being tested at a younger age or more often. Preventing infection Hepatitis B  If you have a higher risk for hepatitis B, you should be screened for this virus. You are considered at high risk for hepatitis B if: ? You were born in a country where hepatitis B is common. Ask your health care provider which countries are considered high risk. ? Your parents were born in a high-risk country, and you have not been immunized against hepatitis B (hepatitis B vaccine). ? You have HIV or AIDS. ? You use needles to inject street drugs. ? You live with someone who has hepatitis B. ? You have had sex with someone who has hepatitis B. ? You get hemodialysis treatment. ? You take certain medicines for conditions, including cancer, organ transplantation, and autoimmune conditions.  Hepatitis C  Blood testing is recommended for: ? Everyone born from [birth year] through 1966. ? Anyone with known risk factors for hepatitis C.  Sexually transmitted infections (STIs)  You should be screened for sexually transmitted infections (STIs) including gonorrhea and chlamydia if: ? You are sexually active and are younger than 73 years of age. ? You are older than 73 years of age and your health care provider tells you that you are at risk for this type of infection. ? Your sexual activity has changed since you were last screened and you are at an increased risk for chlamydia or gonorrhea. Ask your health care provider if you are at risk.  If you do not have HIV, but are at risk, it may be recommended that you take a prescription medicine daily to prevent HIV infection. This is called pre-exposure prophylaxis (PrEP). You are considered at risk  if: ? You are sexually active and do not regularly use condoms or know the HIV status of your partner(s). ? You take drugs by injection. ? You are sexually active with a partner who has HIV.  Talk with your health care provider about whether you are at high risk of being infected with HIV. If you choose to begin PrEP, you should first be tested for HIV. You should then be tested every 3 months for as long as you  are taking PrEP. Pregnancy  If you are premenopausal and you may become pregnant, ask your health care provider about preconception counseling.  If you may become pregnant, take 400 to 800 micrograms (mcg) of folic acid every day.  If you want to prevent pregnancy, talk to your health care provider about birth control (contraception). Osteoporosis and menopause  Osteoporosis is a disease in which the bones lose minerals and strength with aging. This can result in serious bone fractures. Your risk for osteoporosis can be identified using a bone density scan.  If you are 25 years of age or older, or if you are at risk for osteoporosis and fractures, ask your health care provider if you should be screened.  Ask your health care provider whether you should take a calcium or vitamin D supplement to lower your risk for osteoporosis.  Menopause may have certain physical symptoms and risks.  Hormone replacement therapy may reduce some of these symptoms and risks. Talk to your health care provider about whether hormone replacement therapy is right for you. Follow these instructions at home:  Schedule regular health, dental, and eye exams.  Stay current with your immunizations.  Do not use any tobacco products including cigarettes, chewing tobacco, or electronic cigarettes.  If you are pregnant, do not drink alcohol.  If you are breastfeeding, limit how much and how often you drink alcohol.  Limit alcohol intake to no more than 1 drink per day for nonpregnant women. One drink  equals 12 ounces of beer, 5 ounces of wine, or 1 ounces of hard liquor.  Do not use street drugs.  Do not share needles.  Ask your health care provider for help if you need support or information about quitting drugs.  Tell your health care provider if you often feel depressed.  Tell your health care provider if you have ever been abused or do not feel safe at home. This information is not intended to replace advice given to you by your health care provider. Make sure you discuss any questions you have with your health care provider. Document Released: 12/27/2010 Document Revised: 11/19/2015 Document Reviewed: 03/17/2015 [employer] Interactive Patient Education  [employer].

## 2018-01-26 NOTE — Progress Notes (Signed)
Subjective:   Alison Cuevas is a 72 y.o. female who presents for Medicare Annual (Subsequent) preventive examination.  Review of Systems:  No ROS.  Medicare Wellness Visit. Additional risk factors are reflected in the social history.  Cardiac Risk Factors include: advanced age (>43men, >49 women);dyslipidemia;hypertension   Sleep patterns: Sleeps 6-7 hours, up to void.  Home Safety/Smoke Alarms: Feels safe in home. Smoke alarms in place.  Living environment; residence and Firearm Safety: Lives with husband in 2 story home, rail at steps.  Seat Belt Safety/Bike Helmet: Wears seat belt.   Female:   Pap-06/17/2015       Mammo-06/23/2017, BI-RADS CATEGORY  1: Negative.      Dexa scan-07/30/2015, Osteopenia Ordered today.      CCS-Colonoscopy 05/26/2016     Objective:     Vitals: BP (!) 148/72 (BP Location: Left Arm, Patient Position: Sitting, Cuff Size: Normal)   Pulse (!) 59   Ht 5\' 1"  (1.549 m)   Wt 127 lb 2 oz (57.7 kg)   LMP 06/27/1972   SpO2 96%   BMI 24.02 kg/m   Body mass index is 24.02 kg/m.  Advanced Directives 01/26/2018 01/24/2018 01/15/2018 11/03/2017 08/31/2017 08/31/2017 08/21/2017  Does Patient Have a Medical Advance Directive? Yes Yes Yes Yes Yes - Yes  Type of Advance Directive Living will;Healthcare Power of Attorney Living will Healthcare Power of [place];Living will Healthcare Power of [place] of [place]  Does patient want to make changes to medical advance directive? - No - Patient declined - - No - Patient declined - No - Patient declined  Copy of [place] in Chart? No - copy requested - No - copy requested No - copy requested No - copy requested - No - copy requested  Would patient like information on creating a medical advance directive? - - - - - - -    Tobacco Social History   Tobacco Use  Smoking Status Never Smoker  Smokeless Tobacco Never Used       Counseling given: Not Answered   Past Medical History:  Diagnosis Date  . Allergic rhinitis    maple pollen  . Anxiety   . Arthritis   . Bowel obstruction ([place])   . Chronic low back pain    Dr. Trenton Gammon.  Has had back injection--bp elevated after.  . Chronic pain syndrome   . DDD (degenerative disc disease), cervical    Hx of ACDF (Dr. [name]).  Followed by Dr. [name].  Also, Dr. [name] do left C7-T1 intralaminal epidural injection.  . Diverticulosis of colon   . DJD (degenerative joint disease)   . Gallstones   . GERD (gastroesophageal reflux disease)   . Herpes zoster 07/08/2014  . Hypercholesteremia    mild; pt declined statin trial 05/2014--needs recheck lipid panel at first f/u visit in 2017  . Hypertension   . Osteopenia 07/2015   T score of -1.4 FRAX15%/2%  . Ovarian cyst 2017   GYN following:  f/u imaging 02/2016 showed stability.  Her GYN referred her to [facility] for 2nd opinion 03/02/16.  Gyn onc felt surgery likely not necessary but gave pt option of elective resection---pt declined.  04/2017 f/u: pt symptomatic and to reconsider the option of surgery.  Pt more symptomatic late 2018 and surgery is planned for [id number]-robotic assisted bilatel SPO w/possible staging [facility])  . Perianal dermatitis    prn cutivate  . Phlebitis   . Right knee  injury 2018   Patellofemoral crush injury--Dr. [name].  . Trochanteric bursitis of right hip    Recurrent (injection by Dr. [name] 05/26/15)  . 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 Benjamine Mola, will likely get hearing aid.  F/u 03/2017 w/Dr. Yates Decamp sign of infection, option of TM repair or amplification--pt to think abnout it.  As of 05/2017, plan is ossiculoplasty and tympanoplasty with Dr. May on 08/30/16 [facility]).  . Varicose vein    left leg    Past Surgical History:  Procedure Laterality Date  . ABDOMINAL HYSTERECTOMY     TAH  . ANTERIOR AND POSTERIOR REPAIR N/A  03/18/2014   Procedure: Cystocele repair with graft, Vault suspension, Rectocele repair;  Surgeon: [name], MD;  Location: [facility];  Service: Urology;  Laterality: N/A;  . CHOLECYSTECTOMY    . CHOLECYSTECTOMY, LAPAROSCOPIC    . COLON SURGERY    . COLONOSCOPY  04/2006; 05/26/16   2007 (Dr. [name]): Normal.  04/2016 (Dr. [name]) normal except diverticulosis and decreased anal sphincter tone.  No repeat colonoscopy is recommended due to age.  . cspine surgery     Dr. Wiliam Ke level ant cerv discectomy /fusion w/plating  . CYSTO N/A 03/18/2014   Procedure: CYSTO;  Surgeon: [name], MD;  Location: [facility];  Service: Urology;  Laterality: N/A;  . DEXA  07/30/2015   Osteopenia--repeat 2 yrs (Dr. Reino Kent)  . RECTOCELE REPAIR     with prolasped bledder repair  . RESECTION OF COLON     BENIGN TUMOR  . RIGHT EAR SURGERY  08/23/2017   Dr. 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 right hemicolectomy for diverticulitis with abscess  1993  . TUBAL LIGATION     Family History  Problem Relation Age of Onset  . Hypertension Mother   . Diverticulitis Mother   . Breast cancer Sister 50  . Melanoma Brother   . Prostate cancer Brother   . Leukemia Brother   . Lupus Sister   . Lung disease Brother   . Stomach cancer Father   . Other Unknown        Family member with MGUS   Social History   Socioeconomic History  . Marital status: Married    Spouse name: [name]  . Number of children: 3  . Years of education: Not on file  . Highest education level: Not on file  Occupational History  . Occupation: retired  Scientific laboratory technician  . Financial resource strain: Not on file  . Food insecurity:    Worry: Not on file    Inability: Not on file  . Transportation needs:    Medical: Not on file    Non-medical: Not on file  Tobacco Use  . Smoking status:  Never Smoker  . Smokeless tobacco: Never Used  Substance and Sexual Activity  . Alcohol use: No    Alcohol/week: 0.0 oz  . Drug use: No  . Sexual activity: Never    Birth control/protection: Surgical    Comment: 1st intercourse 89 yo-1 partner  Lifestyle  . Physical activity:    Days per week: Not on file    Minutes per session: Not on file  . Stress: Not on file  Relationships  . Social connections:    Talks on phone: Not on file    Gets together: Not on file  Attends religious service: Not on file    Active member of club or organization: Not on file    Attends meetings of clubs or organizations: Not on file    Relationship status: Not on file  Other Topics Concern  . Not on file  Social History Narrative   Married, has 3 children (one lives near her).  Has 8 grandchildren, 2 greatgrandchildren.   Worked cleaning apartments, worked at [employer], was a Secretary/administrator.   She retired at age 39.   No tobacco.  No alcohol.  No drugs.   Exercise: clean, work in yard.        10 siblings in all--5 have passed away--1 child age 24 with whooping cough , 1 lupus, 1 melanoma,1 lung cancer, 1 pulm fibrosis    Outpatient Encounter Medications as of 01/26/2018  Medication Sig  . ALPRAZolam (XANAX) 0.5 MG tablet Take 1 tablet (0.5 mg total) by mouth 3 (three) times daily as needed for anxiety.  . Biotin 10000 MCG TABS Take 10,000 mcg by mouth daily.   . calcium carbonate (OSCAL) 1500 (600 Ca) MG TABS tablet Take 600 mg of elemental calcium by mouth daily with breakfast.  . Carboxymethylcellul-Glycerin (LUBRICATING EYE DROPS OP) Place 1 drop into both eyes daily as needed (dry eyes).  . Cholecalciferol (VITAMIN D3) 5000 units CAPS Take 5,000 Units by mouth daily.  Marland Kitchen CIPRODEX OTIC suspension Place 4-5 drops into the right ear 2 (two) times daily.   Marland Kitchen estradiol (ESTRACE VAGINAL) 0.1 MG/GM vaginal cream Apply 1/4 applicator 3 times weekly vaginally. (Patient taking differently: Place 0.98  Applicatorfuls vaginally at bedtime as needed (dryness). )  . fexofenadine (ALLEGRA) 180 MG tablet Take 180 mg by mouth daily as needed for allergies or rhinitis.  . fluticasone (CUTIVATE) 0.05 % cream Apply to affected area bid prn (Patient taking differently: Apply 1 application topically 2 (two) times daily as needed (irritation). )  . fluticasone (FLONASE) 50 MCG/ACT nasal spray Place 2 sprays into both nostrils at bedtime. (Patient taking differently: Place 2 sprays into both nostrils daily as needed for allergies or rhinitis. )  . HYDROcodone-acetaminophen (NORCO/VICODIN) 5-325 MG tablet 1 tab po tid prn pain (Patient taking differently: Take 1 tablet by mouth 3 (three) times daily as needed for severe pain. )  . Lactobacillus-Inulin (CULTURELLE DIGESTIVE HEALTH PO) Take 1 tablet by mouth daily as needed (upset stomach).   . lisinopril (PRINIVIL,ZESTRIL) 10 MG tablet Take 1 tablet (10 mg total) by mouth daily.  . Multiple Vitamins-Minerals (MULTIVITAMIN ADULT) TABS Take by mouth daily.  . ondansetron (ZOFRAN) 4 MG tablet Take 4 mg by mouth every 8 (eight) hours as needed for nausea or vomiting.  Marland Kitchen POTASSIUM PO Take 1 tablet by mouth daily.   Marland Kitchen PREVIDENT 5000 BOOSTER PLUS 1.1 % PSTE BRUSH TEETH 2 TO 3 TIMES A DAY. AFTER USE SPIT OUT, BUT DO NOT RINSE.  Marland Kitchen Simethicone (GAS-X PO) Take 1 tablet by mouth daily as needed (gas).  . verapamil (CALAN-SR) 180 MG CR tablet TAKE 1 TABLET AT BEDTIME  . vitamin B-12 (CYANOCOBALAMIN) 1000 MCG tablet Take 2,000 mcg by mouth daily.  Marland Kitchen aspirin 81 MG tablet Take 81 mg by mouth daily.  Marland Kitchen CRANBERRY EXTRACT PO Take 1 tablet by mouth daily.   . meloxicam (MOBIC) 15 MG tablet TAKE 1 TABLET EVERY DAY WITH FOOD AS NEEDED FOR MUSCULOSKELETAL PAIN (Patient not taking: Reported on 01/26/2018)  . ofloxacin (FLOXIN) 0.3 % OTIC solution Place 5 drops into the right  ear 2 (two) times daily. For up to 7 days for ear pain or drainage.  . Red Yeast Rice 600 MG CAPS Take 600 mg by  mouth daily.   Marland Kitchen terconazole (TERAZOL 7) 0.4 % vaginal cream Place 1 applicator vaginally at bedtime. For 7 nights (Patient not taking: Reported on 01/17/2018)   No facility-administered encounter medications on file as of 01/26/2018.     Activities of Daily Living In your present state of health, do you have any difficulty performing the following activities: 01/26/2018 01/24/2018  Hearing? N Y  Comment - RIGHT EAR  Vision? N N  Difficulty concentrating or making decisions? N N  Walking or climbing stairs? N Y  Dressing or bathing? N N  Doing errands, shopping? N N  Preparing Food and eating ? N -  Using the Toilet? N -  In the past six months, have you accidently leaked urine? N -  Do you have problems with loss of bowel control? N -  Managing your Medications? N -  Managing your Finances? N -  Housekeeping or managing your Housekeeping? N -  Some recent data might be hidden    Patient Care Team: [name], MD as PCP - General (Family Medicine) Bjorn Loser, MD as Consulting Physician (Urology) Fontaine, Belinda Block, MD as Consulting Physician (Gynecology) Everitt Amber, MD as Consulting Physician (Obstetrics and Gynecology) Gatha Mayer, MD as Consulting Physician (Gastroenterology) Almedia Balls, MD as Consulting Physician (Orthopedic Surgery) Laroy Apple, MD as Referring Physician (Physical Medicine and Rehabilitation) Clista Bernhardt, MD as Referring Physician (Dermatology) Leta Baptist, MD as Consulting Physician (Otolaryngology) Levy Sjogren, MD as Referring Physician (Dermatology)    Assessment:   This is a routine wellness examination for Imya.  Exercise Activities and Dietary recommendations Current Exercise Habits: The patient does not participate in regular exercise at present("Maintains household"), Exercise limited by: None identified   Diet (meal preparation, eat out, water intake, caffeinated beverages, dairy products, fruits and  vegetables): Drinks milk, cranberry juice, soda and water.   Breakfast: oatmeal; coffee Lunch: sandwich; vegetables Dinner: Eats out; vegetables.      Goals    . Patient Stated     Maintain current health.        Fall Risk Fall Risk  01/26/2018 12/19/2017 12/05/2016 08/24/2015 12/23/2014  Falls in the past year? No No Yes No Yes  Number falls in past yr: - - 1 - 1  Injury with Fall? - - No - No  Follow up - - Falls prevention discussed - -    Depression Screen PHQ 2/9 Scores 01/26/2018 12/19/2017 12/05/2016 08/24/2015  PHQ - 2 Score 0 0 0 0  PHQ- 9 Score - 0 - -     Cognitive Function MMSE - Mini Mental State Exam 01/26/2018  Orientation to time 4  Orientation to Place 5  Registration 3  Attention/ Calculation 5  Recall 2  Language- name 2 objects 2  Language- repeat 1  Language- follow 3 step command 3  Language- read & follow direction 1  Write a sentence 1  Copy design 0  Total score 27        Immunization History  Administered Date(s) Administered  . Influenza Split 04/27/2013  . Influenza-Unspecified 03/30/2015  . Pneumococcal Conjugate-13 12/23/2014  . Pneumococcal Polysaccharide-23 05/24/2010, 11/16/2010  . Tdap 09/29/2014, 12/17/2016  . Zoster 11/18/2014    Screening Tests Health Maintenance  Topic Date Due  . INFLUENZA VACCINE  01/25/2018  . MAMMOGRAM  06/24/2019  . COLONOSCOPY  05/26/2026  . TETANUS/TDAP  12/18/2026  . DEXA SCAN  Completed  . Hepatitis C Screening  Completed  . PNA vac Low Risk Adult  Completed        Plan:    Schedule bone scan in December with mammogram.   Continue doing brain stimulating activities (puzzles, reading, adult coloring books, staying active) to keep memory sharp.   I have personally reviewed and noted the following in the patient's chart:   . Medical and social history . Use of alcohol, tobacco or illicit drugs  . Current medications and supplements . Functional ability and status . Nutritional  status . Physical activity . Advanced directives . List of other physicians . Hospitalizations, surgeries, and ER visits in previous 12 months . Vitals . Screenings to include cognitive, depression, and falls . Referrals and appointments  In addition, I have reviewed and discussed with patient certain preventive protocols, quality metrics, and best practice recommendations. A written personalized care plan for preventive services as well as general preventive health recommendations were provided to patient.     Gerilyn Nestle, RN  01/26/2018  F/U with PCP 03/22/18

## 2018-01-26 NOTE — Progress Notes (Signed)
AWV reviewed and agree. Signed:  Crissie Sickles, MD           01/26/2018

## 2018-01-31 ENCOUNTER — Ambulatory Visit (HOSPITAL_COMMUNITY)
Admission: RE | Admit: 2018-01-31 | Discharge: 2018-01-31 | Disposition: A | Discharge status: Home / Self Care | Payer: Medicare Other | Source: Ambulatory Visit | Attending: Gynecologic Oncology | Admitting: Gynecologic Oncology

## 2018-01-31 ENCOUNTER — Encounter (HOSPITAL_COMMUNITY): Payer: Self-pay | Admitting: Anesthesiology

## 2018-01-31 ENCOUNTER — Ambulatory Visit (HOSPITAL_COMMUNITY): Payer: Medicare Other | Admitting: Anesthesiology

## 2018-01-31 ENCOUNTER — Encounter (HOSPITAL_COMMUNITY)
Admission: RE | Disposition: A | Discharge status: Home / Self Care | Payer: Self-pay | Source: Ambulatory Visit | Attending: Gynecologic Oncology

## 2018-01-31 DIAGNOSIS — Z7982 Long term (current) use of aspirin: Secondary | ICD-10-CM | POA: Insufficient documentation

## 2018-01-31 DIAGNOSIS — Z79890 Hormone replacement therapy (postmenopausal): Secondary | ICD-10-CM | POA: Insufficient documentation

## 2018-01-31 DIAGNOSIS — Z90710 Acquired absence of both cervix and uterus: Secondary | ICD-10-CM | POA: Diagnosis not present

## 2018-01-31 DIAGNOSIS — Z9889 Other specified postprocedural states: Secondary | ICD-10-CM | POA: Insufficient documentation

## 2018-01-31 DIAGNOSIS — Z79899 Other long term (current) drug therapy: Secondary | ICD-10-CM | POA: Diagnosis not present

## 2018-01-31 DIAGNOSIS — Z79891 Long term (current) use of opiate analgesic: Secondary | ICD-10-CM | POA: Diagnosis not present

## 2018-01-31 DIAGNOSIS — I10 Essential (primary) hypertension: Secondary | ICD-10-CM | POA: Diagnosis not present

## 2018-01-31 DIAGNOSIS — N83201 Unspecified ovarian cyst, right side: Secondary | ICD-10-CM | POA: Diagnosis not present

## 2018-01-31 DIAGNOSIS — F419 Anxiety disorder, unspecified: Secondary | ICD-10-CM | POA: Diagnosis not present

## 2018-01-31 DIAGNOSIS — Z791 Long term (current) use of non-steroidal anti-inflammatories (NSAID): Secondary | ICD-10-CM | POA: Diagnosis not present

## 2018-01-31 DIAGNOSIS — Z9049 Acquired absence of other specified parts of digestive tract: Secondary | ICD-10-CM | POA: Diagnosis not present

## 2018-01-31 DIAGNOSIS — M545 Low back pain: Secondary | ICD-10-CM | POA: Insufficient documentation

## 2018-01-31 DIAGNOSIS — K219 Gastro-esophageal reflux disease without esophagitis: Secondary | ICD-10-CM | POA: Diagnosis not present

## 2018-01-31 DIAGNOSIS — R8569 Abnormal cytological findings in specimens from other digestive organs and abdominal cavity: Secondary | ICD-10-CM | POA: Diagnosis not present

## 2018-01-31 DIAGNOSIS — Z8249 Family history of ischemic heart disease and other diseases of the circulatory system: Secondary | ICD-10-CM | POA: Insufficient documentation

## 2018-01-31 DIAGNOSIS — Z800 Family history of malignant neoplasm of digestive organs: Secondary | ICD-10-CM | POA: Insufficient documentation

## 2018-01-31 DIAGNOSIS — Z888 Allergy status to other drugs, medicaments and biological substances status: Secondary | ICD-10-CM | POA: Diagnosis not present

## 2018-01-31 DIAGNOSIS — N390 Urinary tract infection, site not specified: Secondary | ICD-10-CM | POA: Diagnosis not present

## 2018-01-31 DIAGNOSIS — Z803 Family history of malignant neoplasm of breast: Secondary | ICD-10-CM | POA: Insufficient documentation

## 2018-01-31 DIAGNOSIS — G8929 Other chronic pain: Secondary | ICD-10-CM | POA: Diagnosis not present

## 2018-01-31 DIAGNOSIS — N83291 Other ovarian cyst, right side: Secondary | ICD-10-CM | POA: Diagnosis not present

## 2018-01-31 HISTORY — PX: ROBOTIC ASSISTED BILATERAL SALPINGO OOPHERECTOMY: SHX6078

## 2018-01-31 HISTORY — PX: LYSIS OF ADHESION: SHX5961

## 2018-01-31 LAB — TYPE AND SCREEN
ABO/RH(D): A POS
Antibody Screen: NEGATIVE

## 2018-01-31 SURGERY — SALPINGO-OOPHORECTOMY, BILATERAL, ROBOT-ASSISTED
Anesthesia: General

## 2018-01-31 MED ORDER — ARTIFICIAL TEARS OPHTHALMIC OINT
TOPICAL_OINTMENT | OPHTHALMIC | Status: AC
  Filled 2018-01-31: qty 3.5

## 2018-01-31 MED ORDER — DEXAMETHASONE SODIUM PHOSPHATE 10 MG/ML IJ SOLN
INTRAMUSCULAR | Status: AC
  Filled 2018-01-31: qty 1

## 2018-01-31 MED ORDER — ACETAMINOPHEN 325 MG PO TABS: 325.0000 mg | ORAL_TABLET | ORAL | Status: DC | PRN

## 2018-01-31 MED ORDER — MIDAZOLAM HCL 2 MG/2ML IJ SOLN
INTRAMUSCULAR | Status: DC | PRN
  Administered 2018-01-31: 2 mg via INTRAVENOUS

## 2018-01-31 MED ORDER — SUGAMMADEX SODIUM 200 MG/2ML IV SOLN
INTRAVENOUS | Status: DC | PRN
  Administered 2018-01-31: 50 mg via INTRAVENOUS
  Administered 2018-01-31: 200 mg via INTRAVENOUS

## 2018-01-31 MED ORDER — ROCURONIUM BROMIDE 10 MG/ML (PF) SYRINGE
PREFILLED_SYRINGE | INTRAVENOUS | Status: DC | PRN
  Administered 2018-01-31: 5 mg via INTRAVENOUS
  Administered 2018-01-31: 45 mg via INTRAVENOUS

## 2018-01-31 MED ORDER — FENTANYL CITRATE (PF) 250 MCG/5ML IJ SOLN
INTRAMUSCULAR | Status: AC
  Filled 2018-01-31: qty 5

## 2018-01-31 MED ORDER — DEXAMETHASONE SODIUM PHOSPHATE 4 MG/ML IJ SOLN: 4.0000 mg | INTRAMUSCULAR | Status: DC

## 2018-01-31 MED ORDER — PROPOFOL 10 MG/ML IV BOLUS
INTRAVENOUS | Status: AC
  Filled 2018-01-31: qty 40

## 2018-01-31 MED ORDER — FENTANYL CITRATE (PF) 100 MCG/2ML IJ SOLN
INTRAMUSCULAR | Status: DC | PRN
  Administered 2018-01-31: 150 ug via INTRAVENOUS
  Administered 2018-01-31 (×2): 50 ug via INTRAVENOUS

## 2018-01-31 MED ORDER — HYDROMORPHONE HCL 1 MG/ML IJ SOLN: 0.2500 mg | INTRAMUSCULAR | Status: DC | PRN

## 2018-01-31 MED ORDER — KETOROLAC TROMETHAMINE 30 MG/ML IJ SOLN
INTRAMUSCULAR | Status: AC
  Filled 2018-01-31: qty 1

## 2018-01-31 MED ORDER — STERILE WATER FOR IRRIGATION IR SOLN
Status: DC | PRN
  Administered 2018-01-31: 1000 mL

## 2018-01-31 MED ORDER — SENNA 8.6 MG PO TABS: 1.0000 | ORAL_TABLET | Freq: Every day | ORAL | 0 refills | Status: AC

## 2018-01-31 MED ORDER — ACETAMINOPHEN 160 MG/5ML PO SOLN: 325.0000 mg | ORAL | Status: DC | PRN

## 2018-01-31 MED ORDER — ONDANSETRON HCL 4 MG/2ML IJ SOLN
4.0000 mg | Freq: Once | INTRAMUSCULAR | Status: AC | PRN
  Administered 2018-01-31: 4 mg via INTRAVENOUS

## 2018-01-31 MED ORDER — SUGAMMADEX SODIUM 200 MG/2ML IV SOLN
INTRAVENOUS | Status: AC
  Filled 2018-01-31: qty 2

## 2018-01-31 MED ORDER — SCOPOLAMINE 1 MG/3DAYS TD PT72
1.0000 | MEDICATED_PATCH | TRANSDERMAL | Status: AC
  Administered 2018-01-31: 1 via TRANSDERMAL
  Filled 2018-01-31: qty 1

## 2018-01-31 MED ORDER — CEFAZOLIN SODIUM-DEXTROSE 2-4 GM/100ML-% IV SOLN
2.0000 g | INTRAVENOUS | Status: AC
  Administered 2018-01-31: 2 g via INTRAVENOUS
  Filled 2018-01-31: qty 100

## 2018-01-31 MED ORDER — MIDAZOLAM HCL 2 MG/2ML IJ SOLN
INTRAMUSCULAR | Status: AC
  Filled 2018-01-31: qty 2

## 2018-01-31 MED ORDER — KETOROLAC TROMETHAMINE 30 MG/ML IJ SOLN
INTRAMUSCULAR | Status: DC | PRN
  Administered 2018-01-31: 30 mg via INTRAVENOUS

## 2018-01-31 MED ORDER — OXYCODONE HCL 5 MG PO TABS
ORAL_TABLET | ORAL | Status: AC
  Administered 2018-01-31: 5 mg via ORAL
  Filled 2018-01-31: qty 1

## 2018-01-31 MED ORDER — ROCURONIUM BROMIDE 10 MG/ML (PF) SYRINGE
PREFILLED_SYRINGE | INTRAVENOUS | Status: AC
  Filled 2018-01-31: qty 10

## 2018-01-31 MED ORDER — ONDANSETRON HCL 4 MG/2ML IJ SOLN
INTRAMUSCULAR | Status: AC
  Filled 2018-01-31: qty 2

## 2018-01-31 MED ORDER — DEXAMETHASONE SODIUM PHOSPHATE 10 MG/ML IJ SOLN
INTRAMUSCULAR | Status: DC | PRN
  Administered 2018-01-31: 10 mg via INTRAVENOUS

## 2018-01-31 MED ORDER — BUPIVACAINE HCL (PF) 0.25 % IJ SOLN
INTRAMUSCULAR | Status: DC | PRN
  Administered 2018-01-31: 22 mL

## 2018-01-31 MED ORDER — LIDOCAINE 2% (20 MG/ML) 5 ML SYRINGE
INTRAMUSCULAR | Status: DC | PRN
  Administered 2018-01-31: 100 mg via INTRAVENOUS

## 2018-01-31 MED ORDER — ACETAMINOPHEN 500 MG PO TABS
1000.0000 mg | ORAL_TABLET | ORAL | Status: AC
  Administered 2018-01-31: 1000 mg via ORAL
  Filled 2018-01-31: qty 2

## 2018-01-31 MED ORDER — HYDROMORPHONE HCL 1 MG/ML IJ SOLN
0.2500 mg | INTRAMUSCULAR | Status: DC | PRN
  Administered 2018-01-31: 0.5 mg via INTRAVENOUS
  Administered 2018-01-31 (×2): 0.25 mg via INTRAVENOUS

## 2018-01-31 MED ORDER — STERILE WATER FOR INJECTION IJ SOLN
INTRAMUSCULAR | Status: AC
  Filled 2018-01-31: qty 10

## 2018-01-31 MED ORDER — LACTATED RINGERS IV SOLN
INTRAVENOUS | Status: DC
  Administered 2018-01-31 (×2): via INTRAVENOUS

## 2018-01-31 MED ORDER — EPHEDRINE 5 MG/ML INJ
INTRAVENOUS | Status: AC
  Filled 2018-01-31: qty 10

## 2018-01-31 MED ORDER — MEPERIDINE HCL 50 MG/ML IJ SOLN: 6.2500 mg | INTRAMUSCULAR | Status: DC | PRN

## 2018-01-31 MED ORDER — PROPOFOL 10 MG/ML IV BOLUS
INTRAVENOUS | Status: DC | PRN
  Administered 2018-01-31: 50 mg via INTRAVENOUS
  Administered 2018-01-31: 100 mg via INTRAVENOUS

## 2018-01-31 MED ORDER — ONDANSETRON HCL 4 MG/2ML IJ SOLN: 4.0000 mg | Freq: Once | INTRAMUSCULAR | Status: DC | PRN

## 2018-01-31 MED ORDER — HYDROMORPHONE HCL 1 MG/ML IJ SOLN
INTRAMUSCULAR | Status: AC
  Administered 2018-01-31: 0.25 mg via INTRAVENOUS
  Filled 2018-01-31: qty 1

## 2018-01-31 MED ORDER — HYDROCODONE-ACETAMINOPHEN 7.5-325 MG PO TABS: 1.0000 | ORAL_TABLET | Freq: Once | ORAL | Status: DC | PRN

## 2018-01-31 MED ORDER — OXYCODONE HCL 5 MG PO TABS
5.0000 mg | ORAL_TABLET | Freq: Once | ORAL | Status: AC | PRN
  Administered 2018-01-31: 5 mg via ORAL

## 2018-01-31 MED ORDER — BUPIVACAINE HCL (PF) 0.5 % IJ SOLN
INTRAMUSCULAR | Status: AC
  Filled 2018-01-31: qty 30

## 2018-01-31 MED ORDER — LIDOCAINE 2% (20 MG/ML) 5 ML SYRINGE
INTRAMUSCULAR | Status: AC
  Filled 2018-01-31: qty 5

## 2018-01-31 SURGICAL SUPPLY — 47 items
APPLICATOR SURGIFLO ENDO (HEMOSTASIS) IMPLANT
BAG LAPAROSCOPIC 12 15 PORT 16 (BASKET) ×2 IMPLANT
BAG RETRIEVAL 12/15 (BASKET) ×3
COVER BACK TABLE 60X90IN (DRAPES) ×3 IMPLANT
COVER TIP SHEARS 8 DVNC (MISCELLANEOUS) ×2 IMPLANT
COVER TIP SHEARS 8MM DA VINCI (MISCELLANEOUS) ×1
DERMABOND ADVANCED (GAUZE/BANDAGES/DRESSINGS) ×1
DERMABOND ADVANCED .7 DNX12 (GAUZE/BANDAGES/DRESSINGS) ×2 IMPLANT
DRAPE ARM DVNC X/XI (DISPOSABLE) ×8 IMPLANT
DRAPE COLUMN DVNC XI (DISPOSABLE) ×2 IMPLANT
DRAPE DA VINCI XI ARM (DISPOSABLE) ×4
DRAPE DA VINCI XI COLUMN (DISPOSABLE) ×1
DRAPE SHEET LG 3/4 BI-LAMINATE (DRAPES) ×3 IMPLANT
DRAPE SURG IRRIG POUCH 19X23 (DRAPES) ×3 IMPLANT
ELECT REM PT RETURN 15FT ADLT (MISCELLANEOUS) ×3 IMPLANT
GLOVE BIO SURGEON STRL SZ 6 (GLOVE) ×12 IMPLANT
GLOVE BIO SURGEON STRL SZ 6.5 (GLOVE) IMPLANT
GOWN STRL REUS W/ TWL LRG LVL3 (GOWN DISPOSABLE) ×4 IMPLANT
GOWN STRL REUS W/TWL LRG LVL3 (GOWN DISPOSABLE) ×2
HOLDER FOLEY CATH W/STRAP (MISCELLANEOUS) IMPLANT
IRRIG SUCT STRYKERFLOW 2 WTIP (MISCELLANEOUS) ×3
IRRIGATION SUCT STRKRFLW 2 WTP (MISCELLANEOUS) ×2 IMPLANT
KIT PROCEDURE DA VINCI SI (MISCELLANEOUS)
KIT PROCEDURE DVNC SI (MISCELLANEOUS) IMPLANT
MANIPULATOR UTERINE 4.5 ZUMI (MISCELLANEOUS) IMPLANT
NEEDLE HYPO 22GX1.5 SAFETY (NEEDLE) ×3 IMPLANT
NEEDLE SPNL 18GX3.5 QUINCKE PK (NEEDLE) IMPLANT
OBTURATOR OPTICAL STANDARD 8MM (TROCAR) ×1
OBTURATOR OPTICAL STND 8 DVNC (TROCAR) ×2
OBTURATOR OPTICALSTD 8 DVNC (TROCAR) ×2 IMPLANT
PACK ROBOT GYN CUSTOM WL (TRAY / TRAY PROCEDURE) ×3 IMPLANT
PAD POSITIONING PINK XL (MISCELLANEOUS) ×3 IMPLANT
PORT ACCESS TROCAR AIRSEAL 12 (TROCAR) ×2 IMPLANT
PORT ACCESS TROCAR AIRSEAL 5M (TROCAR) ×1
POUCH SPECIMEN RETRIEVAL 10MM (ENDOMECHANICALS) ×3 IMPLANT
SEAL CANN UNIV 5-8 DVNC XI (MISCELLANEOUS) ×8 IMPLANT
SEAL XI 5MM-8MM UNIVERSAL (MISCELLANEOUS) ×4
SET TRI-LUMEN FLTR TB AIRSEAL (TUBING) ×3 IMPLANT
SURGIFLO W/THROMBIN 8M KIT (HEMOSTASIS) IMPLANT
SUT VIC AB 0 CT1 27 (SUTURE)
SUT VIC AB 0 CT1 27XBRD ANTBC (SUTURE) IMPLANT
SYR 10ML LL (SYRINGE) IMPLANT
TOWEL OR NON WOVEN STRL DISP B (DISPOSABLE) ×3 IMPLANT
TRAP SPECIMEN MUCOUS 40CC (MISCELLANEOUS) ×3 IMPLANT
TRAY FOLEY MTR SLVR 16FR STAT (SET/KITS/TRAYS/PACK) ×3 IMPLANT
UNDERPAD 30X30 (UNDERPADS AND DIAPERS) ×3 IMPLANT
WATER STERILE IRR 1000ML POUR (IV SOLUTION) ×3 IMPLANT

## 2018-01-31 NOTE — Interval H&P Note (Signed)
History and Physical Interval Note:  01/31/2018 10:07 AM  Alison Cuevas  has presented today for surgery, with the diagnosis of RIGHT OVARIAN CYST  The various methods of treatment have been discussed with the patient and family. After consideration of risks, benefits and other options for treatment, the patient has consented to  Procedure(s): XI ROBOTIC ASSISTED BILATERAL SALPINGO OOPHORECTOMY (Bilateral) POSIBLE STAGING (N/A) POSSIBLE LYSIS OF ADHESION (N/A) as a surgical intervention .  The patient's history has been reviewed, patient examined, no change in status, stable for surgery.  I have reviewed the patient's chart and labs.  Questions were answered to the patient's satisfaction.     Thereasa Solo

## 2018-01-31 NOTE — Anesthesia Postprocedure Evaluation (Signed)
Anesthesia Post Note  Patient: Alison Cuevas  Procedure(s) Performed: XI ROBOTIC ASSISTED BILATERAL SALPINGO OOPHORECTOMY (Bilateral ) POSSIBLE LYSIS OF ADHESION (N/A )     Patient location during evaluation: PACU Anesthesia Type: General Level of consciousness: awake and alert Pain management: pain level controlled Vital Signs Assessment: post-procedure vital signs reviewed and stable Respiratory status: spontaneous breathing, nonlabored ventilation, respiratory function stable and patient connected to nasal cannula oxygen Cardiovascular status: blood pressure returned to baseline and stable Postop Assessment: no apparent nausea or vomiting Anesthetic complications: no    Last Vitals:  Vitals:   01/31/18 1500 01/31/18 1515  BP: 128/68 (!) 143/77  Pulse: 70 82  Resp: 13 13  Temp:    SpO2: 96% 99%    Last Pain:  Vitals:   01/31/18 1515  TempSrc:   PainSc: 4                  Oluwaseyi Raffel DAVID

## 2018-01-31 NOTE — Op Note (Signed)
OPERATIVE NOTE  Date: 01/31/18  Preoperative Diagnosis: Ovarian cyst   Postoperative Diagnosis:  Right ovarian benign cyst  Procedure(s) Performed: Robotic-assisted laparoscopic bilateral salpingo-oophorectomy.   Surgeon: Everitt Amber, M.D.  Assistant Surgeon: Precious Haws M.D. (an MD assistant was necessary for tissue manipulation, management of robotic instrumentation, retraction and positioning due to the complexity of the case and hospital policies).   Anesthesia: Gen. endotracheal.  Specimens: Bilateral ovaries, fallopian tubes, pelvic washings  Estimated Blood Loss: 10 mL. Blood Replacement: None  Complications: none  Indication for Procedure: pelvic pain, 10cm right ovarian cyst.   Operative Findings: 10cm right ovarian cyst (unilocular) which was adherent to the omentum. Normal left tube and ovary. Normal upper abdomen.   Frozen pathology was consistent with benign cyst on right ovary.  Procedure: The patient's taken to the operating room and placed under general endotracheal anesthesia testing difficulty. She is placed in a dorsolithotomy position and cervical acromial pad was placed. The arms were tucked with care taken to pad the olecranon process. And prepped and draped in usual sterile fashion. A uterine manipulator (zumi) was placed vaginally. A 66mm incision was made in the left upper quadrant palmer's point and a 5 mm Optiview trocar used to enter the abdomen under direct visualization. With entry into the abdomen and then maintenance of 15 mm of mercury the patient was placed in Trendelenburg position. An incision was made in the umbilicus and a 16XW trochar was placed through this site. Two incisions were made lateral to the umbilical incision in the left and right abdomen measuring 63mm. These incisions were made approximately 10 cm lateral to the umbilical incision. 8 mm robotic trochars were inserted. Sharp adhesiolysis was performed to separate the omentum from the  anterior abdominal wall and ovary. The robot was docked.  The abdomen was inspected as was the pelvis.  Pelvic washings were obtained. An incision was made on the right pelvic side wall peritoneum parallel to the IP ligament and the retroperitoneal space entered. The right ureter was identified and the para-rectal space was developed. A window was created in the right broad ligament above the ureter. The right infundibulopelvic vessels were skeletonized cauterized and transected. ovarian- vaginal ligaments similarly were cauterized and transected. Specimen was placed in an Endo Catch bag.  In a similar manner the left peritoneum and the side wall was incised, and the retroperitoneal space entered. The left ureter was identified and the left pararectal space was developed. The utero-ovarian ligament was skeletonized cauterized and transected. The left ovarian-vaginal ligaments were cauterized and transected in the left adnexa was placed in an Endo Catch bag.  The abdomen was copiously irrigated and drained and all operative sites inspected and hemostasis was assured  The robot was undocked. The contents of the right Endo Catch bag were first aspirated and then morcellated to facilitate removal from the abdominal cavity through the LUQ incision. In a similar fashion the contents of the left Endo Catch bag or morcellated to facilitate removal from the abdominal cavity.  The ports were all remove. The fascial closure at the umbilical incision and left upper quadrant port was made with 0 Vicryl.  All incisions were closed with a running subcuticular Monocryl suture. Dermabond was applied. Sponge, lap and needle counts were correct x 3.    The patient had sequential compression devices for VTE prophylaxis.         Disposition: PACU -stable         Condition: stable  Donaciano Eva, MD

## 2018-01-31 NOTE — Anesthesia Procedure Notes (Addendum)
Procedure Name: Intubation Date/Time: 01/31/2018 12:00 PM Performed by: Lillia Abed, MD Pre-anesthesia Checklist: Patient identified, Emergency Drugs available, Suction available and Patient being monitored Patient Re-evaluated:Patient Re-evaluated prior to induction Oxygen Delivery Method: Circle system utilized Preoxygenation: Pre-oxygenation with 100% oxygen Induction Type: IV induction Ventilation: Mask ventilation without difficulty Laryngoscope Size: Mac and 3 Grade View: Grade II Tube type: Oral Tube size: 7.0 mm Number of attempts: 1 Airway Equipment and Method: Stylet Placement Confirmation: ETT inserted through vocal cords under direct vision,  positive ETCO2 and breath sounds checked- equal and bilateral Secured at: 23 cm Tube secured with: Tape Dental Injury: Teeth and Oropharynx as per pre-operative assessment

## 2018-01-31 NOTE — Transfer of Care (Signed)
  Last Vitals:  Vitals Value Taken Time  BP 134/96 01/31/2018  1:30 PM  Temp    Pulse 87 01/31/2018  1:32 PM  Resp 26 01/31/2018  1:32 PM  SpO2 100 % 01/31/2018  1:32 PM  Vitals shown include unvalidated device data.  Last Pain:  Vitals:   01/31/18 1013  TempSrc:   PainSc: 6       Immediate Anesthesia Transfer of Care Note  Patient: Alison Cuevas  Procedure(s) Performed: Procedure(s) (LRB): XI ROBOTIC ASSISTED BILATERAL SALPINGO OOPHORECTOMY (Bilateral) POSSIBLE LYSIS OF ADHESION (N/A)  Patient Location: PACU  Anesthesia Type: General  Level of Consciousness: awake, alert  and oriented  Airway & Oxygen Therapy: Patient Spontanous Breathing and Patient connected to face mask oxygen  Post-op Assessment: Report given to PACU RN and Post -op Vital signs reviewed and stable  Post vital signs: Reviewed and stable  Complications: No apparent anesthesia complications

## 2018-01-31 NOTE — Anesthesia Preprocedure Evaluation (Addendum)
Anesthesia Evaluation  Patient identified by MRN, date of birth, ID band Patient awake    Reviewed: Allergy & Precautions, NPO status , Patient's Chart, lab work & pertinent test results  Airway Mallampati: I  TM Distance: >3 FB Neck ROM: Limited    Dental  (+) Teeth Intact, Dental Advisory Given,    Pulmonary    Pulmonary exam normal        Cardiovascular hypertension, Pt. on medications Normal cardiovascular exam     Neuro/Psych Anxiety ACDF C3-6    GI/Hepatic GERD  Medicated,  Endo/Other    Renal/GU      Musculoskeletal  (+) Arthritis , Osteoarthritis,  Fibromyalgia -, narcotic dependent  Abdominal   Peds  Hematology   Anesthesia Other Findings   Reproductive/Obstetrics                          Anesthesia Physical Anesthesia Plan  ASA: II  Anesthesia Plan: General   Post-op Pain Management:    Induction: Intravenous  PONV Risk Score and Plan: 3 and Ondansetron, Midazolam and Treatment may vary due to age or medical condition  Airway Management Planned: Oral ETT  Additional Equipment:   Intra-op Plan:   Post-operative Plan: Extubation in OR  Informed Consent: I have reviewed the patients History and Physical, chart, labs and discussed the procedure including the risks, benefits and alternatives for the proposed anesthesia with the patient or authorized representative who has indicated his/her understanding and acceptance.     Plan Discussed with: CRNA and Surgeon  Anesthesia Plan Comments:         Anesthesia Quick Evaluation

## 2018-01-31 NOTE — Discharge Instructions (Signed)
01/31/2018  Return to work: 4 weeks  Activity: 1. Be up and out of the bed during the day.  Take a nap if needed.  You may walk up steps but be careful and use the hand rail.  Stair climbing will tire you more than you think, you may need to stop part way and rest.   2. No lifting or straining for 6 weeks.  3. No driving for 1 weeks.  Do Not drive if you are taking narcotic pain medicine.  4. Shower daily.  Use soap and water on your incision and pat dry; don't rub.   5. No sexual activity and nothing in the vagina for 2 weeks.  Medications:  - Take ibuprofen and tylenol first line for pain control. Take these regularly (every 6 hours) to decrease the build up of pain.  - If necessary, for severe pain not relieved by ibuprofen, take percocet.  - While taking percocet you should take sennakot every night to reduce the likelihood of constipation. If this causes diarrhea, stop its use.  Diet: 1. Low sodium Heart Healthy Diet is recommended.  2. It is safe to use a laxative if you have difficulty moving your bowels.   Wound Care: 1. Keep clean and dry.  Shower daily.  Reasons to call the Doctor:   Fever - Oral temperature greater than 100.4 degrees Fahrenheit  Foul-smelling vaginal discharge  Difficulty urinating  Nausea and vomiting  Increased pain at the site of the incision that is unrelieved with pain medicine.  Difficulty breathing with or without chest pain  New calf pain especially if only on one side  Sudden, continuing increased vaginal bleeding with or without clots.   Follow-up: 1. See Everitt Amber in 4 weeks.  Contacts: For questions or concerns you should contact:  Dr. Everitt Amber at [phone] After hours and on week-ends call [phone] and ask to speak to the physician on call for Gynecologic Oncology

## 2018-02-01 ENCOUNTER — Encounter (HOSPITAL_COMMUNITY): Payer: Self-pay | Admitting: Gynecologic Oncology

## 2018-02-05 ENCOUNTER — Telehealth: Payer: Self-pay | Admitting: *Deleted

## 2018-02-05 NOTE — Telephone Encounter (Signed)
Called patient to give her surgical path results.  Per Joylene John, NP surgical path report is benign.  Patient states that she" I am still in some pain, bu the pain medication is working".  Patient wanted to know if it would be ok to bend over the sink to wash her hair. Per Joylene John, NP that should be fine but take it slow.  Patient states that the  Silvana Newness is making her stomach cramp.  Patient ask if she could take Miralax.  Per Joylene John, NP.  Patient can take Miralax twice a day. Patient verbalized understanding.

## 2018-02-06 ENCOUNTER — Ambulatory Visit: Payer: Medicare Other

## 2018-02-06 ENCOUNTER — Telehealth: Payer: Self-pay

## 2018-02-06 DIAGNOSIS — B379 Candidiasis, unspecified: Secondary | ICD-10-CM

## 2018-02-06 MED ORDER — FLUCONAZOLE 150 MG PO TABS: 150.0000 mg | ORAL_TABLET | Freq: Every day | ORAL | 0 refills | Status: DC

## 2018-02-06 NOTE — Telephone Encounter (Signed)
Pt states that she has begun with vaginal itching and burning in the last couple of days.  She is drinking plenty of fluids.  She stated that she is prone to yeast infections when she takes ATB.  Alison Cuevas received ATB around her surgery on 01-31-18. Alison Cuevas will send in diflucan 150 mg for yeast infection. Requested Alison Cuevas to call this Thursday or Friday if her symptoms have not improved.  Pt verbalized understanding.

## 2018-02-09 NOTE — Telephone Encounter (Signed)
Ms Bennett called back stated that the diflucan was effective and the itching is much better.

## 2018-02-21 ENCOUNTER — Encounter: Payer: Self-pay | Admitting: Gynecologic Oncology

## 2018-02-21 ENCOUNTER — Inpatient Hospital Stay: Payer: Medicare Other | Attending: Gynecologic Oncology | Admitting: Gynecologic Oncology

## 2018-02-21 ENCOUNTER — Ambulatory Visit: Payer: Medicare Other | Admitting: Gynecologic Oncology

## 2018-02-21 VITALS — BP 131/69 | HR 65 | Temp 97.9°F | Resp 20 | Ht 60.0 in | Wt 126.3 lb

## 2018-02-21 DIAGNOSIS — N83201 Unspecified ovarian cyst, right side: Secondary | ICD-10-CM | POA: Diagnosis not present

## 2018-02-21 DIAGNOSIS — Z90722 Acquired absence of ovaries, bilateral: Secondary | ICD-10-CM

## 2018-02-21 DIAGNOSIS — H7291 Unspecified perforation of tympanic membrane, right ear: Secondary | ICD-10-CM | POA: Diagnosis not present

## 2018-02-21 DIAGNOSIS — Z9889 Other specified postprocedural states: Secondary | ICD-10-CM | POA: Diagnosis not present

## 2018-02-21 DIAGNOSIS — Z888 Allergy status to other drugs, medicaments and biological substances status: Secondary | ICD-10-CM | POA: Diagnosis not present

## 2018-02-21 NOTE — Patient Instructions (Signed)
Please notify Dr Denman George at phone number [phone] if you have any questions related to your surgery.

## 2018-02-21 NOTE — Progress Notes (Signed)
Follow-up Note: Gyn-Onc  Consult was requested by Dr. Phineas Real for the evaluation of Alison Cuevas 72 y.o. female  CC:  Chief Complaint  Patient presents with  . Ovarian mass    Assessment/Plan:  [name]  is a 72 y.o.  year old with a history of an 11cm benign ovarian cyst s/p robotic BSO on 01/31/18.  She is cleared postop and released. Doing well.   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 Phineas Real for a large right ovarian cysts.  The patient has a long-standing history of bladder prolapse and recurrent UTIs for which she was being treated by Dr.MacDiarmid at [facility]. Due to the persistent nature of her symptoms she underwent a CT scan of the abdomen and pelvis to workup this issue further that was performed on 10/29/2015. It revealed an incidental finding of a 7.9 cm right pelvic cystic structure.  Follow-up the evaluation was performed with a repeat ultrasound on 03/02/2016. This revealed the cyst to measure a 0.5 cm in largest dimension with a second smaller cyst within it measuring 13 x 12 mm. Left ovary was normal. The uterus was surgically absent. A CA-125 was drawn and was normal at 6.  The patient reports intermittent sharp right flank and lower abdominal pains. Is unclear what these are associated with, she thinks maybe with straining or lifting she notices some more. She is chronic back pain issues. She's also had a significant surgical history for a laparotomy with hemicolectomy, laparoscopic cholecystectomy, and total abdominal hysterectomy in her 20s for fibroid uterus and pelvic pain.  She is no concerning family history that she has a likely genetic predisposition to ovarian or breast cancer.  She had an excellent performance status she takes aspirin.  The patient elected for expectant management as she was worried about the convalescence associated with surgical recovery. In  January, 2018 she began experiencing intermittent severe abdominal pains. Repeat US in March, 2018 showed a stable 9cm ovarian cyst with homogeneous low level internal echoes and stable small eccentric thin walled 1.1cm daughter cyst. No vascularity or mural nodules.  CA 125 on 08/22/16 was normal and stable at 8.4.  The patient elected for expectant management because she did not want to manage a surgical convalescence while managing her husband's health issues.  In the meantime she developed a severe right ear problem which included erosion of the ear drum and middle ear. This is being managed by an ENT with a plan for reconstruction in March, 2019. This is causing her substantial pain, and anxiety.  She began developing increasing lower abdominal pains in October, 2018. She saw Dr Phineas Real who was concerned that now the cyst was symptomatic she might be interested in its removal.  She wanted to hold off having surgery for the ovarian mass while she has having major concerns with her ear drum.  Repeat US of the pelvis on 05/25/17 showed a cystic lesion is again seen in the right adnexa which shows low level internal echoes and a tiny peripheral mural cystic structure. No mural solid nodules or internal septations. This measures 10.6 x 7.1 x 10.6 cm and shows mild increase in size from approximately 8 cm on previous studies dating back to 2017. This has indeterminate but probably benign characteristics.   She is s/p tympanoplasty reconstruction surgery on 08/23/17 with Dr May.  She has had some slow healing and has not yet re-epthelialized  the bony implants per patient. Is continuing to use drops and see the ENT office regularly for wash outs.   Follow-up ultrasound on October 20, 2017 revealed a stable right ovarian mass measuring 11 x 8.5 x 7.8 cm (previously 10.6 cm in greatest dimension).  The lesion contained low-level complex homogeneous internal echogenicity.  No discrete mural nodularity was  seen.  The left ovary was normal and small at 1.3 cm.  The uterus is surgically absent.  Interval Hx:  On 01/31/18 she underwent robotic assisted BSO. A 10cm right ovarian cyst was removed. Pathology revealed a benign simple cyst with negative washings.   Current Meds:  Outpatient Encounter Medications as of 02/21/2018  Medication Sig  . ALPRAZolam (XANAX) 0.5 MG tablet Take 1 tablet (0.5 mg total) by mouth 3 (three) times daily as needed for anxiety.  . Biotin 10000 MCG TABS Take 10,000 mcg by mouth daily.   . calcium carbonate (OSCAL) 1500 (600 Ca) MG TABS tablet Take 600 mg of elemental calcium by mouth daily with breakfast.  . Carboxymethylcellul-Glycerin (LUBRICATING EYE DROPS OP) Place 1 drop into both eyes daily as needed (dry eyes).  . Cholecalciferol (VITAMIN D3) 5000 units CAPS Take 5,000 Units by mouth daily.  Marland Kitchen CRANBERRY EXTRACT PO Take 1 tablet by mouth daily.   Marland Kitchen estradiol (ESTRACE VAGINAL) 0.1 MG/GM vaginal cream Apply 1/4 applicator 3 times weekly vaginally. (Patient taking differently: Place 0.73 Applicatorfuls vaginally at bedtime as needed (dryness). )  . fexofenadine (ALLEGRA) 180 MG tablet Take 180 mg by mouth daily as needed for allergies or rhinitis.  . fluticasone (CUTIVATE) 0.05 % cream Apply to affected area bid prn (Patient taking differently: Apply 1 application topically 2 (two) times daily as needed (irritation). )  . fluticasone (FLONASE) 50 MCG/ACT nasal spray Place 2 sprays into both nostrils at bedtime. (Patient taking differently: Place 2 sprays into both nostrils daily as needed for allergies or rhinitis. )  . HYDROcodone-acetaminophen (NORCO/VICODIN) 5-325 MG tablet 1 tab po tid prn pain (Patient taking differently: Take 1 tablet by mouth 3 (three) times daily as needed for severe pain. )  . lisinopril (PRINIVIL,ZESTRIL) 10 MG tablet Take 1 tablet (10 mg total) by mouth daily.  . meloxicam (MOBIC) 15 MG tablet TAKE 1 TABLET EVERY DAY WITH FOOD AS NEEDED FOR  MUSCULOSKELETAL PAIN  . Multiple Vitamins-Minerals (MULTIVITAMIN ADULT) TABS Take by mouth daily.  . ondansetron (ZOFRAN) 4 MG tablet Take 4 mg by mouth every 8 (eight) hours as needed for nausea or vomiting.  Marland Kitchen POTASSIUM PO Take 1 tablet by mouth daily.   Marland Kitchen PREVIDENT 5000 BOOSTER PLUS 1.1 % PSTE BRUSH TEETH 2 TO 3 TIMES A DAY. AFTER USE SPIT OUT, BUT DO NOT RINSE.  Marland Kitchen Simethicone (GAS-X PO) Take 1 tablet by mouth daily as needed (gas).  . verapamil (CALAN-SR) 180 MG CR tablet TAKE 1 TABLET AT BEDTIME  . vitamin B-12 (CYANOCOBALAMIN) 1000 MCG tablet Take 2,000 mcg by mouth daily.  Marland Kitchen CIPRODEX OTIC suspension Place 4-5 drops into the right ear 2 (two) times daily.   . Lactobacillus-Inulin (CULTURELLE DIGESTIVE HEALTH PO) Take 1 tablet by mouth daily as needed (upset stomach).   Marland Kitchen ofloxacin (FLOXIN) 0.3 % OTIC solution Place 5 drops into the right ear 2 (two) times daily. For up to 7 days for ear pain or drainage.  . Red Yeast Rice 600 MG CAPS Take 600 mg by mouth daily.   . [DISCONTINUED] fluconazole (DIFLUCAN) 150 MG tablet Take 1 tablet (150  mg total) by mouth daily. (Patient not taking: Reported on 02/21/2018)   No facility-administered encounter medications on file as of 02/21/2018.     Allergy:  Allergies  Allergen Reactions  . Gabapentin Other (See Comments)    Elevated heart rate and crazy dreams  . Prednisone     REACTION: funny feeling    Social Hx:   Social History   Socioeconomic History  . Marital status: Married    Spouse name: [name]  . Number of children: 3  . Years of education: Not on file  . Highest education level: Not on file  Occupational History  . Occupation: retired  Scientific laboratory technician  . Financial resource strain: Not on file  . Food insecurity:    Worry: Not on file    Inability: Not on file  . Transportation needs:    Medical: Not on file    Non-medical: Not on file  Tobacco Use  . Smoking status: Never Smoker  . Smokeless tobacco: Never Used   Substance and Sexual Activity  . Alcohol use: No    Alcohol/week: 0.0 standard drinks  . Drug use: No  . Sexual activity: Never    Birth control/protection: Surgical    Comment: 1st intercourse 27 yo-1 partner  Lifestyle  . Physical activity:    Days per week: Not on file    Minutes per session: Not on file  . Stress: Not on file  Relationships  . Social connections:    Talks on phone: Not on file    Gets together: Not on file    Attends religious service: Not on file    Active member of club or organization: Not on file    Attends meetings of clubs or organizations: Not on file    Relationship status: Not on file  . Intimate partner violence:    Fear of current or ex partner: Not on file    Emotionally abused: Not on file    Physically abused: Not on file    Forced sexual activity: Not on file  Other Topics Concern  . Not on file  Social History Narrative   Married, has 3 children (one lives near her).  Has 8 grandchildren, 2 greatgrandchildren.   Worked cleaning apartments, worked at [employer], was a Secretary/administrator.   She retired at age 38.   No tobacco.  No alcohol.  No drugs.   Exercise: clean, work in yard.        10 siblings in all--5 have passed away--1 child age 48 with whooping cough , 1 lupus, 1 melanoma,1 lung cancer, 1 pulm fibrosis    Past Surgical Hx:  Past Surgical History:  Procedure Laterality Date  . ABDOMINAL HYSTERECTOMY     TAH  . ANTERIOR AND POSTERIOR REPAIR N/A 03/18/2014   Procedure: Cystocele repair with graft, Vault suspension, Rectocele repair;  Surgeon: [name], MD;  Location: [facility];  Service: Urology;  Laterality: N/A;  . CHOLECYSTECTOMY    . CHOLECYSTECTOMY, LAPAROSCOPIC    . COLON SURGERY    . COLONOSCOPY  04/2006; 05/26/16   2007 (Dr. [name]): Normal.  04/2016 (Dr. [name]) normal except diverticulosis and decreased anal sphincter tone.  No repeat colonoscopy is recommended due to age.  . cspine surgery     Dr.  Wiliam Ke level ant cerv discectomy /fusion w/plating  . CYSTO N/A 03/18/2014   Procedure: CYSTO;  Surgeon: [name], MD;  Location: [facility];  Service: Urology;  Laterality: N/A;  . DEXA  07/30/2015   Osteopenia--repeat 2 yrs (Dr. Reino Kent)  . LYSIS OF ADHESION N/A 01/31/2018   Procedure: POSSIBLE LYSIS OF ADHESION;  Surgeon: Everitt Amber, MD;  Location: [facility];  Service: Gynecology;  Laterality: N/A;  . [place]     with prolasped bledder repair  . RESECTION OF COLON     BENIGN TUMOR  . RIGHT EAR SURGERY  08/23/2017   Dr. 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 right hemicolectomy for diverticulitis with abscess  1993  . ROBOTIC ASSISTED BILATERAL SALPINGO OOPHERECTOMY Bilateral 01/31/2018   All PATH benign.  Procedure: XI ROBOTIC ASSISTED BILATERAL SALPINGO OOPHORECTOMY;  Surgeon: Everitt Amber, MD;  Location: [facility];  Service: Gynecology;  Laterality: Bilateral;  . TUBAL LIGATION      Past Medical Hx:  Past Medical History:  Diagnosis Date  . Allergic rhinitis    maple pollen  . Anxiety   . Arthritis   . Bowel obstruction ([place])   . Chronic low back pain    Dr. Trenton Gammon.  Has had back injection--bp elevated after.  . Chronic pain syndrome   . DDD (degenerative disc disease), cervical    Hx of ACDF (Dr. [name]).  Followed by Dr. [name].  Also, Dr. [name] do left C7-T1 intralaminal epidural injection.  . Diverticulosis of colon   . DJD (degenerative joint disease)   . Gallstones   . GERD (gastroesophageal reflux disease)   . Herpes zoster 07/08/2014  . Hypercholesteremia    mild; pt declined statin trial 05/2014--needs recheck lipid panel at first f/u visit in 2017  . Hypertension   . Osteopenia 07/2015   T score of -1.4 FRAX15%/2%  . Ovarian cyst 2017   82019-robotic assisted bilatel SPO: all path benign.  . Perianal  dermatitis    prn cutivate  . Phlebitis   . Right knee injury 2018   Patellofemoral crush injury--Dr. [name].  . Trochanteric bursitis of right hip    Recurrent (injection by Dr. [name] 05/26/15)  . 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 Benjamine Mola, will likely get hearing aid.  F/u 03/2017 w/Dr. Yates Decamp sign of infection, option of TM repair or amplification--pt to think abnout it.  As of 05/2017, plan is ossiculoplasty and tympanoplasty with Dr. May on 08/30/16 [facility]).  . Varicose vein    left leg     Past Gynecological History:  SVD x 2, hysterectomy in her 20's Patient's last menstrual period was 06/27/1972.  Family Hx:  Family History  Problem Relation Age of Onset  . Hypertension Mother   . Diverticulitis Mother   . Breast cancer Sister 73  . Melanoma Brother   . Prostate cancer Brother   . Leukemia Brother   . Lupus Sister   . Lung disease Brother   . Stomach cancer Father   . Other Unknown        Family member with MGUS    Review of Systems:  Constitutional  Feels well,    ENT Normal appearing ears and nares bilaterally Skin/Breast  No rash, sores, jaundice, itching, dryness Cardiovascular  No chest pain, shortness of breath, or edema  Pulmonary  No cough or wheeze.  Gastro Intestinal  No nausea, vomitting, or diarrhoea. No bright red blood per rectum, no abdominal pain, change in bowel movement, or constipation.  Genito Urinary  No frequency, urgency, dysuria, occasional lower  pelvic right sided pain Musculo Skeletal  No myalgia, arthralgia, joint swelling or pain  Neurologic  No weakness, numbness, change in gait,  Psychology  No depression, anxiety, insomnia.   Vitals:  Blood pressure 131/69, pulse 65, temperature 97.9 F (36.6 C), temperature source Oral, resp. rate 20, height 5' (1.524 m), weight 126 lb 4.8 oz (57.3 kg), last menstrual period 06/27/1972, SpO2 98 %.  Physical Exam: WD in NAD Neck   Supple NROM, without any enlargements.  Lymph Node Survey No cervical supraclavicular or inguinal adenopathy Cardiovascular  Pulse normal rate, regularity and rhythm. S1 and S2 normal.  Lungs  Clear to auscultation bilateraly, without wheezes/crackles/rhonchi. Good air movement.  Skin  No rash/lesions/breakdown  Psychiatry  Alert and oriented to person, place, and time  Abdomen  Normoactive bowel sounds, abdomen soft, non-tender and thin without evidence of hernia. Well healed incisions.  Back No CVA tenderness Genito Urinary  deferred Extremities  No bilateral cyanosis, clubbing or edema.   Thereasa Solo, MD  02/21/2018, 2:51 PM

## 2018-02-22 ENCOUNTER — Other Ambulatory Visit: Payer: Self-pay

## 2018-02-22 DIAGNOSIS — Z9889 Other specified postprocedural states: Secondary | ICD-10-CM

## 2018-02-22 MED ORDER — SENNA 8.6 MG PO TABS: 1.0000 | ORAL_TABLET | Freq: Every day | ORAL | 0 refills | Status: AC

## 2018-02-22 NOTE — Telephone Encounter (Signed)
Incoming call from patient regarding she forgot to ask Dr Denman George at yesterday's visit, if she can start back on her baby Asprin daily and can she get a refill for the Senna because it helps.  Per Joylene John NP, ok for pt to resume baby Asprin as long as she is not having any bleeding and can refill Senna once but then she can buy over-the-counter.  Pt voiced understanding and denies any bleeding. Order for Senna as same as previous order by S. Phelps on 8-7.  No other needs per pt at this time.

## 2018-03-02 ENCOUNTER — Other Ambulatory Visit: Payer: Self-pay

## 2018-03-02 ENCOUNTER — Inpatient Hospital Stay: Payer: Medicare Other | Attending: Gynecologic Oncology

## 2018-03-02 ENCOUNTER — Telehealth: Payer: Self-pay | Admitting: Gynecologic Oncology

## 2018-03-02 ENCOUNTER — Telehealth: Payer: Self-pay

## 2018-03-02 DIAGNOSIS — R300 Dysuria: Secondary | ICD-10-CM

## 2018-03-02 DIAGNOSIS — N83201 Unspecified ovarian cyst, right side: Secondary | ICD-10-CM | POA: Insufficient documentation

## 2018-03-02 LAB — URINALYSIS, COMPLETE (UACMP) WITH MICROSCOPIC
Bacteria, UA: NONE SEEN
Bilirubin Urine: NEGATIVE
Glucose, UA: NEGATIVE mg/dL
Hgb urine dipstick: NEGATIVE
Ketones, ur: NEGATIVE mg/dL
Nitrite: NEGATIVE
Protein, ur: NEGATIVE mg/dL
Specific Gravity, Urine: 1.004 — ABNORMAL LOW (ref 1.005–1.030)
pH: 7 (ref 5.0–8.0)

## 2018-03-02 NOTE — Telephone Encounter (Signed)
Called patient with urinalysis results.  Advised we should wait for the culture results and we would call her with the results.  Agreeable with the plan.

## 2018-03-02 NOTE — Addendum Note (Signed)
Addended by: Phineas Inches on: 03/02/2018 01:59 PM   Modules accepted: Orders

## 2018-03-02 NOTE — Telephone Encounter (Addendum)
Returned call to patient regarding she thinks she has UTI.  Reports she took an AZO test and it turned color very fast, this was after she woke up this morning with burning with urination.  She also reports intermittent vaginal and low pelvic pain, "right at my hairline."  Reports she didn't know if "Diflucan" would help like few weeks ago but pt denies itching this time.  Per Joylene John, NP- pt can see her PCP or come in to have her urine checked.  Pt prefers to come here.  Per Melissa NP - U/A, urine culture, and lab only appt placed. No other needs per pt at this time.

## 2018-03-03 LAB — URINE CULTURE

## 2018-03-06 ENCOUNTER — Telehealth: Payer: Self-pay

## 2018-03-06 NOTE — Telephone Encounter (Signed)
LM stating that the urine culture was contaminated and would not show if she has a UTI. Calling to follow up on her symptoms. Requested that she call back with an update.

## 2018-03-08 ENCOUNTER — Telehealth: Payer: Self-pay

## 2018-03-08 DIAGNOSIS — Z48810 Encounter for surgical aftercare following surgery on the sense organs: Secondary | ICD-10-CM | POA: Diagnosis not present

## 2018-03-08 DIAGNOSIS — Z8669 Personal history of other diseases of the nervous system and sense organs: Secondary | ICD-10-CM | POA: Diagnosis not present

## 2018-03-08 DIAGNOSIS — H60311 Diffuse otitis externa, right ear: Secondary | ICD-10-CM | POA: Diagnosis not present

## 2018-03-08 DIAGNOSIS — H7291 Unspecified perforation of tympanic membrane, right ear: Secondary | ICD-10-CM | POA: Diagnosis not present

## 2018-03-08 NOTE — Telephone Encounter (Signed)
Incoming call from patient.  She verbalized that she wanted to let us know that she is better, urinary symptoms are better, anything against her vaginal area like underwear still uncomfortable, or pressing against pelvic hair line area like her seatbelt uncomfortable also.  Pt also reports she was started on an antibiotic today for an ear infection and reports she is trying to drink plenty of water also.  Encouraged her to keep in touch with our office especially for any worsening symptoms especially fever or no improvement in discomfort.  Pt voiced understanding. No other needs per pt at this time.

## 2018-03-09 DIAGNOSIS — H60311 Diffuse otitis externa, right ear: Secondary | ICD-10-CM | POA: Diagnosis not present

## 2018-03-20 DIAGNOSIS — H7291 Unspecified perforation of tympanic membrane, right ear: Secondary | ICD-10-CM | POA: Diagnosis not present

## 2018-03-20 DIAGNOSIS — Z9889 Other specified postprocedural states: Secondary | ICD-10-CM | POA: Diagnosis not present

## 2018-03-20 DIAGNOSIS — Z48810 Encounter for surgical aftercare following surgery on the sense organs: Secondary | ICD-10-CM | POA: Diagnosis not present

## 2018-03-22 ENCOUNTER — Other Ambulatory Visit: Payer: Self-pay | Admitting: Family Medicine

## 2018-03-22 ENCOUNTER — Encounter: Payer: Self-pay | Admitting: Family Medicine

## 2018-03-22 ENCOUNTER — Ambulatory Visit (INDEPENDENT_AMBULATORY_CARE_PROVIDER_SITE_OTHER): Payer: Medicare Other | Admitting: Family Medicine

## 2018-03-22 VITALS — BP 118/68 | HR 58 | Temp 97.6°F | Resp 16 | Ht 60.0 in | Wt 129.1 lb

## 2018-03-22 DIAGNOSIS — G8929 Other chronic pain: Secondary | ICD-10-CM | POA: Diagnosis not present

## 2018-03-22 DIAGNOSIS — E663 Overweight: Secondary | ICD-10-CM | POA: Diagnosis not present

## 2018-03-22 DIAGNOSIS — F4323 Adjustment disorder with mixed anxiety and depressed mood: Secondary | ICD-10-CM | POA: Diagnosis not present

## 2018-03-22 DIAGNOSIS — I10 Essential (primary) hypertension: Secondary | ICD-10-CM

## 2018-03-22 DIAGNOSIS — M545 Low back pain, unspecified: Secondary | ICD-10-CM

## 2018-03-22 DIAGNOSIS — G894 Chronic pain syndrome: Secondary | ICD-10-CM

## 2018-03-22 DIAGNOSIS — F411 Generalized anxiety disorder: Secondary | ICD-10-CM | POA: Diagnosis not present

## 2018-03-22 LAB — BASIC METABOLIC PANEL
BUN: 13 mg/dL (ref 6–23)
CO2: 27 mEq/L (ref 19–32)
Calcium: 9.3 mg/dL (ref 8.4–10.5)
Chloride: 98 mEq/L (ref 96–112)
Creatinine, Ser: 0.83 mg/dL (ref 0.40–1.20)
GFR: 71.53 mL/min (ref >60.00)
Glucose, Bld: 98 mg/dL (ref 70–99)
Potassium: 4.5 mEq/L (ref 3.5–5.1)
Sodium: 133 mEq/L — ABNORMAL LOW (ref 135–145)

## 2018-03-22 LAB — LIPID PANEL
Cholesterol: 165 mg/dL (ref 0–200)
HDL: 60.9 mg/dL (ref >39.00)
LDL Cholesterol: 89 mg/dL (ref 0–99)
NonHDL: 104.06
Total CHOL/HDL Ratio: 3
Triglycerides: 75 mg/dL (ref 0.0–149.0)
VLDL: 15 mg/dL (ref 0.0–40.0)

## 2018-03-22 NOTE — Progress Notes (Signed)
OFFICE VISIT  03/22/2018   CC:  Chief Complaint  Patient presents with  . Follow-up    RCI, pt is not fasting.    HPI:    Patient is a 72 y.o. Caucasian female who presents for 3 mo f/u chronic pain syndrome, HTN, GAD with adjustment d/o with anx/dep features.   Since I last saw her she got bilat salpingo oopherectomy for a benign ovarian cyst. Says she has now recovered from this surgery pretty good. "I'd be doing great if it wasn't for my ear pain/infection problems". Still dealing with lots of infection problems from her ear TM problem/EAC----has been on bactrim x 2 wks lately. May have to have another surgery.  Indication for chronic opioid: chronic low back and neck pain from DDD, not candidate for further surgical intervention, failed ESI. Medication and dose: Vicodin 5/325, 1 tid prn # pills per month: 90 Last UDS date: 12/19/17 Opioid Treatment Agreement signed (Y/N): Y, 12/19/17 Opioid Treatment Agreement last reviewed with patient:  today [place] reviewed this encounter (include red flags):  today, no red flags.  Anxiety: She takes alprazolam qd - bid for her anxiety, has declined antidepressant or counseling in the past.  HTN: consistently <140/90 when she checks it at home..  ROS: no CP, no SOB, no wheezing, no cough, no HAs, no rashes, no melena/hematochezia.  No polyuria or polydipsia.  No myalgias or arthralgias.   Past Medical History:  Diagnosis Date  . Allergic rhinitis    maple pollen  . Anxiety   . Arthritis   . Bowel obstruction ([place])   . Chronic low back pain    Dr. Trenton Gammon.  Has had back injection--bp elevated after.  . Chronic pain syndrome   . DDD (degenerative disc disease), cervical    Hx of ACDF (Dr. [name]).  Followed by Dr. [name].  Also, Dr. [name] do left C7-T1 intralaminal epidural injection.  . Diverticulosis of colon   . DJD (degenerative joint disease)   . Gallstones   . GERD (gastroesophageal reflux disease)   .  Herpes zoster 07/08/2014  . Hypercholesteremia    mild; pt declined statin trial 05/2014--needs recheck lipid panel at first f/u visit in 2017  . Hypertension   . Osteopenia 07/2015   T score of -1.4 FRAX15%/2%  . Ovarian cyst 2017   82019-robotic assisted bilatel SPO: all path benign.  . Perianal dermatitis    prn cutivate  . Phlebitis   . Right knee injury 2018   Patellofemoral crush injury--Dr. [name].  . Trochanteric bursitis of right hip    Recurrent (injection by Dr. [name] 05/26/15)  . 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 Benjamine Mola, will likely get hearing aid.  F/u 03/2017 w/Dr. Yates Decamp sign of infection, option of TM repair or amplification--pt to think abnout it.  As of 05/2017, plan is ossiculoplasty and tympanoplasty with Dr. May on 08/30/16 [facility]).  . Varicose vein    left leg     Past Surgical History:  Procedure Laterality Date  . ABDOMINAL HYSTERECTOMY     TAH  . ANTERIOR AND POSTERIOR REPAIR N/A 03/18/2014   Procedure: Cystocele repair with graft, Vault suspension, Rectocele repair;  Surgeon: [name], MD;  Location: [facility];  Service: Urology;  Laterality: N/A;  . CHOLECYSTECTOMY    . CHOLECYSTECTOMY, LAPAROSCOPIC    . COLON SURGERY    . COLONOSCOPY  04/2006; 05/26/16   2007 (Dr. [name]): Normal.  04/2016 (Dr. [name]) normal except  diverticulosis and decreased anal sphincter tone.  No repeat colonoscopy is recommended due to age.  . cspine surgery     Dr. Wiliam Ke level ant cerv discectomy /fusion w/plating  . CYSTO N/A 03/18/2014   Procedure: CYSTO;  Surgeon: [name], MD;  Location: [facility];  Service: Urology;  Laterality: N/A;  . DEXA  07/30/2015   Osteopenia--repeat 2 yrs (Dr. Reino Kent)  . LYSIS OF ADHESION N/A 01/31/2018   Procedure: POSSIBLE LYSIS OF ADHESION;  Surgeon: Everitt Amber, MD;  Location: [facility];  Service: Gynecology;  Laterality: N/A;  . [place]     with prolasped bledder  repair  . RESECTION OF COLON     BENIGN TUMOR  . RIGHT EAR SURGERY  08/23/2017   Dr. 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 right hemicolectomy for diverticulitis with abscess  1993  . ROBOTIC ASSISTED BILATERAL SALPINGO OOPHERECTOMY Bilateral 01/31/2018   All PATH benign.  Procedure: XI ROBOTIC ASSISTED BILATERAL SALPINGO OOPHORECTOMY;  Surgeon: Everitt Amber, MD;  Location: [facility];  Service: Gynecology;  Laterality: Bilateral;  . TUBAL LIGATION      Outpatient Medications Prior to Visit  Medication Sig Dispense Refill  . ALPRAZolam (XANAX) 0.5 MG tablet Take 1 tablet (0.5 mg total) by mouth 3 (three) times daily as needed for anxiety. 90 tablet 5  . aspirin EC 81 MG tablet Take 81 mg by mouth daily.    . Biotin 10000 MCG TABS Take 10,000 mcg by mouth daily.     . calcium carbonate (OSCAL) 1500 (600 Ca) MG TABS tablet Take 600 mg of elemental calcium by mouth daily with breakfast.    . Carboxymethylcellul-Glycerin (LUBRICATING EYE DROPS OP) Place 1 drop into both eyes daily as needed (dry eyes).    . Cholecalciferol (VITAMIN D3) 5000 units CAPS Take 5,000 Units by mouth daily.    Marland Kitchen CRANBERRY EXTRACT PO Take 1 tablet by mouth daily.     Marland Kitchen estradiol (ESTRACE VAGINAL) 0.1 MG/GM vaginal cream Apply 1/4 applicator 3 times weekly vaginally. (Patient taking differently: Place 7.34 Applicatorfuls vaginally at bedtime as needed (dryness). ) 42.5 g 2  . fexofenadine (ALLEGRA) 180 MG tablet Take 180 mg by mouth daily as needed for allergies or rhinitis.    . fluticasone (CUTIVATE) 0.05 % cream Apply to affected area bid prn (Patient taking differently: Apply 1 application topically 2 (two) times daily as needed (irritation). ) 30 g 1  . fluticasone (FLONASE) 50 MCG/ACT nasal spray Place 2 sprays into both nostrils at bedtime. (Patient taking differently: Place 2 sprays into both  nostrils daily as needed for allergies or rhinitis. ) 48 g 3  . HYDROcodone-acetaminophen (NORCO/VICODIN) 5-325 MG tablet 1 tab po tid prn pain (Patient taking differently: Take 1 tablet by mouth 3 (three) times daily as needed for severe pain. ) 90 tablet 0  . Lactobacillus-Inulin (CULTURELLE DIGESTIVE HEALTH PO) Take 1 tablet by mouth daily as needed (upset stomach).     . lisinopril (PRINIVIL,ZESTRIL) 10 MG tablet Take 1 tablet (10 mg total) by mouth daily. 30 tablet 2  . meloxicam (MOBIC) 15 MG tablet TAKE 1 TABLET EVERY DAY WITH FOOD AS NEEDED FOR MUSCULOSKELETAL PAIN 90 tablet 1  . Multiple Vitamins-Minerals (MULTIVITAMIN ADULT) TABS Take by mouth daily.    Marland Kitchen ofloxacin (FLOXIN) 0.3 % OTIC solution Place 5 drops into the right ear 2 (two) times  daily. For up to 7 days for ear pain or drainage.  0  . ondansetron (ZOFRAN) 4 MG tablet Take 4 mg by mouth every 8 (eight) hours as needed for nausea or vomiting.    Marland Kitchen POTASSIUM PO Take 1 tablet by mouth daily.     Marland Kitchen PREVIDENT 5000 BOOSTER PLUS 1.1 % PSTE BRUSH TEETH 2 TO 3 TIMES A DAY. AFTER USE SPIT OUT, BUT DO NOT RINSE.  6  . Red Yeast Rice 600 MG CAPS Take 600 mg by mouth daily.     . Simethicone (GAS-X PO) Take 1 tablet by mouth daily as needed (gas).    . vitamin B-12 (CYANOCOBALAMIN) 1000 MCG tablet Take 2,000 mcg by mouth daily.    . verapamil (CALAN-SR) 180 MG CR tablet TAKE 1 TABLET AT BEDTIME 90 tablet 1  . CIPRODEX OTIC suspension Place 4-5 drops into the right ear 2 (two) times daily.   0   No facility-administered medications prior to visit.     Allergies  Allergen Reactions  . Gabapentin Other (See Comments)    Elevated heart rate and crazy dreams  . Prednisone     REACTION: funny feeling    ROS As per HPI  PE: Blood pressure 118/68, pulse (!) 58, temperature 97.6 F (36.4 C), temperature source Oral, resp. rate 16, height 5' (1.524 m), weight 129 lb 2 oz (58.6 kg), last menstrual period 06/27/1972, SpO2 99 %. Gen: Alert,  well appearing.  Patient is oriented to person, place, time, and situation. AFFECT: pleasant, lucid thought and speech. ENT: R EAC with some erythema and moisture distally, w/out edema.  Her TM is essentially nonexistent.  No pus in middle ear. CV: RRR, no m/r/g.   LUNGS: CTA bilat, nonlabored resps, good aeration in all lung fields. EXT: no clubbing or cyanosis.  no edema.    LABS:   Lab Results  Component Value Date   ESRSEDRATE 16 04/05/2011    Lab Results  Component Value Date   TSH 0.790 08/31/2017   Lab Results  Component Value Date   WBC 5.4 01/24/2018   HGB 13.5 01/24/2018   HCT 38.5 01/24/2018   MCV 86.5 01/24/2018   PLT 189 01/24/2018   Lab Results  Component Value Date   IRON 199 (H) 06/03/2015   TIBC 323 06/03/2015   FERRITIN 73 06/03/2015    Lab Results  Component Value Date   CREATININE 0.74 01/24/2018   BUN 13 01/24/2018   NA 138 01/24/2018   K 4.1 01/24/2018   CL 103 01/24/2018   CO2 28 01/24/2018   Lab Results  Component Value Date   ALT 21 01/24/2018   AST 29 01/24/2018   ALKPHOS 67 01/24/2018   BILITOT 1.1 01/24/2018   Lab Results  Component Value Date   CHOL 190 01/27/2017   Lab Results  Component Value Date   HDL 71.00 01/27/2017   Lab Results  Component Value Date   LDLCALC 107 (H) 01/27/2017   Lab Results  Component Value Date   TRIG 57.0 01/27/2017   Lab Results  Component Value Date   CHOLHDL 3 01/27/2017   Lab Results  Component Value Date   HGBA1C 4.9 01/30/2017   Lab Results  Component Value Date   VITAMINB12 >2,000 (H) 12/19/2017    IMPRESSION AND PLAN:  1) Chronic pain syndrome (chronic LBP w/out sciatica): The current medical regimen is effective;  continue present plan and medications.   No rx's needed today. CSC and UDS UTD.  2) GAD with adjustment d/o with anx/dep--->adj d/o largely secondary to her R ear troubles (chronic pain, recurrent infections, surgery x 1 and may need another).  Stable on  alprazolam bid prn.  No new rx needed today. She once again declines antidepressant and/or counseling.  3) HTN: The current medical regimen is effective;  continue present plan and medications. Lytes/cr today. FLP today.  An After Visit Summary was printed and given to the patient.  FOLLOW UP: Return in about 3 months (around 06/21/2018) for routine chronic illness f/u.  Signed:  Crissie Sickles, MD           03/22/2018

## 2018-03-23 ENCOUNTER — Encounter: Payer: Self-pay | Admitting: Family Medicine

## 2018-03-29 DIAGNOSIS — Z9889 Other specified postprocedural states: Secondary | ICD-10-CM | POA: Diagnosis not present

## 2018-03-29 DIAGNOSIS — H7291 Unspecified perforation of tympanic membrane, right ear: Secondary | ICD-10-CM | POA: Diagnosis not present

## 2018-04-06 ENCOUNTER — Ambulatory Visit: Payer: Medicare Other | Admitting: Gynecologic Oncology

## 2018-04-12 DIAGNOSIS — H6240 Otitis externa in other diseases classified elsewhere, unspecified ear: Secondary | ICD-10-CM | POA: Diagnosis not present

## 2018-04-12 DIAGNOSIS — H60311 Diffuse otitis externa, right ear: Secondary | ICD-10-CM | POA: Diagnosis not present

## 2018-04-12 DIAGNOSIS — H7291 Unspecified perforation of tympanic membrane, right ear: Secondary | ICD-10-CM | POA: Diagnosis not present

## 2018-04-12 DIAGNOSIS — B369 Superficial mycosis, unspecified: Secondary | ICD-10-CM | POA: Diagnosis not present

## 2018-04-12 DIAGNOSIS — Z9889 Other specified postprocedural states: Secondary | ICD-10-CM | POA: Diagnosis not present

## 2018-04-20 DIAGNOSIS — H60311 Diffuse otitis externa, right ear: Secondary | ICD-10-CM | POA: Diagnosis not present

## 2018-04-20 DIAGNOSIS — H7291 Unspecified perforation of tympanic membrane, right ear: Secondary | ICD-10-CM | POA: Diagnosis not present

## 2018-04-20 DIAGNOSIS — Z9889 Other specified postprocedural states: Secondary | ICD-10-CM | POA: Diagnosis not present

## 2018-04-26 DIAGNOSIS — Z4889 Encounter for other specified surgical aftercare: Secondary | ICD-10-CM | POA: Diagnosis not present

## 2018-04-26 DIAGNOSIS — B369 Superficial mycosis, unspecified: Secondary | ICD-10-CM | POA: Diagnosis not present

## 2018-04-26 DIAGNOSIS — Z8669 Personal history of other diseases of the nervous system and sense organs: Secondary | ICD-10-CM | POA: Diagnosis not present

## 2018-04-26 DIAGNOSIS — H6240 Otitis externa in other diseases classified elsewhere, unspecified ear: Secondary | ICD-10-CM | POA: Diagnosis not present

## 2018-04-26 DIAGNOSIS — H6060 Unspecified chronic otitis externa, unspecified ear: Secondary | ICD-10-CM | POA: Diagnosis not present

## 2018-04-26 DIAGNOSIS — B488 Other specified mycoses: Secondary | ICD-10-CM | POA: Diagnosis not present

## 2018-04-26 DIAGNOSIS — H7291 Unspecified perforation of tympanic membrane, right ear: Secondary | ICD-10-CM | POA: Diagnosis not present

## 2018-04-26 DIAGNOSIS — Z9889 Other specified postprocedural states: Secondary | ICD-10-CM | POA: Diagnosis not present

## 2018-05-04 DIAGNOSIS — H6240 Otitis externa in other diseases classified elsewhere, unspecified ear: Secondary | ICD-10-CM | POA: Diagnosis not present

## 2018-05-04 DIAGNOSIS — H60391 Other infective otitis externa, right ear: Secondary | ICD-10-CM | POA: Diagnosis not present

## 2018-05-04 DIAGNOSIS — Z888 Allergy status to other drugs, medicaments and biological substances status: Secondary | ICD-10-CM | POA: Diagnosis not present

## 2018-05-04 DIAGNOSIS — B369 Superficial mycosis, unspecified: Secondary | ICD-10-CM | POA: Diagnosis not present

## 2018-05-04 DIAGNOSIS — H7291 Unspecified perforation of tympanic membrane, right ear: Secondary | ICD-10-CM | POA: Diagnosis not present

## 2018-05-04 DIAGNOSIS — Z9889 Other specified postprocedural states: Secondary | ICD-10-CM | POA: Diagnosis not present

## 2018-05-11 DIAGNOSIS — Z9889 Other specified postprocedural states: Secondary | ICD-10-CM | POA: Diagnosis not present

## 2018-05-11 DIAGNOSIS — H6240 Otitis externa in other diseases classified elsewhere, unspecified ear: Secondary | ICD-10-CM | POA: Diagnosis not present

## 2018-05-11 DIAGNOSIS — H7291 Unspecified perforation of tympanic membrane, right ear: Secondary | ICD-10-CM | POA: Diagnosis not present

## 2018-05-11 DIAGNOSIS — B369 Superficial mycosis, unspecified: Secondary | ICD-10-CM | POA: Diagnosis not present

## 2018-05-16 DIAGNOSIS — H7291 Unspecified perforation of tympanic membrane, right ear: Secondary | ICD-10-CM | POA: Diagnosis not present

## 2018-05-16 DIAGNOSIS — Z9889 Other specified postprocedural states: Secondary | ICD-10-CM | POA: Diagnosis not present

## 2018-05-23 ENCOUNTER — Other Ambulatory Visit: Payer: Self-pay | Admitting: Family Medicine

## 2018-05-23 DIAGNOSIS — Z9889 Other specified postprocedural states: Secondary | ICD-10-CM | POA: Diagnosis not present

## 2018-05-23 DIAGNOSIS — H7291 Unspecified perforation of tympanic membrane, right ear: Secondary | ICD-10-CM | POA: Diagnosis not present

## 2018-05-23 DIAGNOSIS — Z48810 Encounter for surgical aftercare following surgery on the sense organs: Secondary | ICD-10-CM | POA: Diagnosis not present

## 2018-05-23 DIAGNOSIS — H9211 Otorrhea, right ear: Secondary | ICD-10-CM | POA: Diagnosis not present

## 2018-05-23 DIAGNOSIS — M26609 Unspecified temporomandibular joint disorder, unspecified side: Secondary | ICD-10-CM | POA: Diagnosis not present

## 2018-05-31 DIAGNOSIS — H7291 Unspecified perforation of tympanic membrane, right ear: Secondary | ICD-10-CM | POA: Diagnosis not present

## 2018-05-31 DIAGNOSIS — H9211 Otorrhea, right ear: Secondary | ICD-10-CM | POA: Diagnosis not present

## 2018-05-31 DIAGNOSIS — Z9889 Other specified postprocedural states: Secondary | ICD-10-CM | POA: Diagnosis not present

## 2018-06-04 ENCOUNTER — Ambulatory Visit: Payer: Self-pay

## 2018-06-04 NOTE — Telephone Encounter (Signed)
Noted/agree. Signed:  Crissie Sickles, MD           06/04/2018

## 2018-06-04 NOTE — Telephone Encounter (Signed)
FYI

## 2018-06-04 NOTE — Telephone Encounter (Signed)
Patient called in with c/o "diarrhea, nausea, vomiting." She says "I woke up this morning not feeling all that great. I fixed me some oatmeal, ate and took my vitamins. Then I started vomiting, about 7 times, and diarrhea. The diarrhea is more than 7 times, I'm sure. I can't control it, it just runs right out all over everything. I've been in the bed since it all started. My mattress will need to be cleaned. My daughter is going to bring me some gatorade."  I asked about oral intake, she says "I've been sipping, but it makes me so nauseated. If I could get something called in for nausea, I believe I could drink something." I asked about hydration, she says "my mouth is so dry. I don't know when the last time I urinated, because of the diarrhea. If I get up out of the bed, it just runs." I asked about exposure, she says "I was around my sister last Thursday and she had the same thing." I asked about other symptoms, she says "I had a 99 temperature when I first called you and now it's 100. No blood in my stool, it's just brown liquid." According to protocol, go to ED. She says "I just want Dr. Anitra Lauth to call me in something and if I get worse, [facility] is 10 minutes from me and I will go there." I asked the patient to hold while I call to the office. I called and spoke to [place], [facility] who sent the call to [facility], [place]. Erica asked Dr. Anitra Lauth and he said for her to go to the ED. I advised the patient of Dr. Idelle Leech recommendation, she says "I will just wait and if the vomiting starts up again and gets worse, I'll go."    Reason for Disposition . Patient sounds very sick or weak to the triager  Answer Assessment - Initial Assessment Questions 1. DIARRHEA SEVERITY: "How bad is the diarrhea?" "How many extra stools have you had in the past 24 hours than normal?"    - NO DIARRHEA (SCALE 0)   - MILD (SCALE 1-3): Few loose or mushy BMs; increase of 1-3 stools over normal daily number of stools; mild increase in  ostomy output.   -  MODERATE (SCALE 4-7): Increase of 4-6 stools daily over normal; moderate increase in ostomy output. * SEVERE (SCALE 8-10; OR 'WORST POSSIBLE'): Increase of 7 or more stools daily over normal; moderate increase in ostomy output; incontinence.     Severe 2. ONSET: "When did the diarrhea begin?"      This morning 3. BM CONSISTENCY: "How loose or watery is the diarrhea?"      Loose and the last time it was brown water 4. VOMITING: "Are you also vomiting?" If so, ask: "How many times in the past 24 hours?"      7 times today after eating oatmeal this morning 5. ABDOMINAL PAIN: "Are you having any abdominal pain?" If yes: "What does it feel like?" (e.g., crampy, dull, intermittent, constant)      No abdominal pain; just sore under left rib from throwing up 6. ABDOMINAL PAIN SEVERITY: If present, ask: "How bad is the pain?"  (e.g., Scale 1-10; mild, moderate, or severe)   - MILD (1-3): doesn't interfere with normal activities, abdomen soft and not tender to touch    - MODERATE (4-7): interferes with normal activities or awakens from sleep, tender to touch    - SEVERE (8-10): excruciating pain, doubled over, unable to do  any normal activities       N/A 7. ORAL INTAKE: If vomiting, "Have you been able to drink liquids?" "How much fluids have you had in the past 24 hours?"     Sipping, but it makes me so nauseated 8. HYDRATION: "Any signs of dehydration?" (e.g., dry mouth [not just dry lips], too weak to stand, dizziness, new weight loss) "When did you last urinate?"     Dry mouth;  9. EXPOSURE: "Have you traveled to a foreign country recently?" "Have you been exposed to anyone with diarrhea?" "Could you have eaten any food that was spoiled?"     Yes, my sister last Thursday, she had diarrhea and vomiting 10. ANTIBIOTIC USE: "Are you taking antibiotics now or have you taken antibiotics in the past 2 months?"       No 11. OTHER SYMPTOMS: "Do you have any other symptoms?" (e.g.,  fever, blood in stool)       Fever 100.0 12. PREGNANCY: "Is there any chance you are pregnant?" "When was your last menstrual period?"       No  Protocols used: DIARRHEA-A-AH

## 2018-06-07 DIAGNOSIS — H7291 Unspecified perforation of tympanic membrane, right ear: Secondary | ICD-10-CM | POA: Diagnosis not present

## 2018-06-07 DIAGNOSIS — R0981 Nasal congestion: Secondary | ICD-10-CM | POA: Diagnosis not present

## 2018-06-07 DIAGNOSIS — H9209 Otalgia, unspecified ear: Secondary | ICD-10-CM | POA: Diagnosis not present

## 2018-06-07 DIAGNOSIS — M26609 Unspecified temporomandibular joint disorder, unspecified side: Secondary | ICD-10-CM | POA: Diagnosis not present

## 2018-06-07 DIAGNOSIS — H9211 Otorrhea, right ear: Secondary | ICD-10-CM | POA: Diagnosis not present

## 2018-06-07 DIAGNOSIS — Z9889 Other specified postprocedural states: Secondary | ICD-10-CM | POA: Diagnosis not present

## 2018-06-07 DIAGNOSIS — Z48810 Encounter for surgical aftercare following surgery on the sense organs: Secondary | ICD-10-CM | POA: Diagnosis not present

## 2018-06-14 DIAGNOSIS — Z9889 Other specified postprocedural states: Secondary | ICD-10-CM | POA: Diagnosis not present

## 2018-06-14 DIAGNOSIS — H60311 Diffuse otitis externa, right ear: Secondary | ICD-10-CM | POA: Diagnosis not present

## 2018-06-14 DIAGNOSIS — Z48810 Encounter for surgical aftercare following surgery on the sense organs: Secondary | ICD-10-CM | POA: Diagnosis not present

## 2018-06-14 DIAGNOSIS — H9211 Otorrhea, right ear: Secondary | ICD-10-CM | POA: Diagnosis not present

## 2018-06-14 DIAGNOSIS — H7291 Unspecified perforation of tympanic membrane, right ear: Secondary | ICD-10-CM | POA: Diagnosis not present

## 2018-06-18 DIAGNOSIS — H9211 Otorrhea, right ear: Secondary | ICD-10-CM | POA: Diagnosis not present

## 2018-06-19 DIAGNOSIS — B369 Superficial mycosis, unspecified: Secondary | ICD-10-CM | POA: Diagnosis not present

## 2018-06-19 DIAGNOSIS — H6240 Otitis externa in other diseases classified elsewhere, unspecified ear: Secondary | ICD-10-CM | POA: Diagnosis not present

## 2018-06-19 DIAGNOSIS — Z48810 Encounter for surgical aftercare following surgery on the sense organs: Secondary | ICD-10-CM | POA: Diagnosis not present

## 2018-06-25 ENCOUNTER — Other Ambulatory Visit (HOSPITAL_COMMUNITY): Payer: Self-pay | Admitting: Family Medicine

## 2018-06-25 ENCOUNTER — Telehealth: Payer: Self-pay | Admitting: *Deleted

## 2018-06-25 DIAGNOSIS — E2839 Other primary ovarian failure: Secondary | ICD-10-CM

## 2018-06-25 DIAGNOSIS — M8580 Other specified disorders of bone density and structure, unspecified site: Secondary | ICD-10-CM

## 2018-06-25 DIAGNOSIS — Z1231 Encounter for screening mammogram for malignant neoplasm of breast: Secondary | ICD-10-CM

## 2018-06-25 NOTE — Telephone Encounter (Signed)
Yes, ok 

## 2018-06-25 NOTE — Telephone Encounter (Signed)
Order done on 01/26/18 was cancelled. Okay to put in new order? Please advise. Thanks.

## 2018-06-25 NOTE — Telephone Encounter (Signed)
Copied from [place] [id number]. Topic: General - Other >> Jun 25, 2018 10:15 AM Sheran Luz wrote: Reason for CRM:  Patient called requesting an order for Bone density scan. Patient is requesting to have this at [facility]. Please advise.

## 2018-06-25 NOTE — Telephone Encounter (Signed)
Order done. Pt has been scheduled on 07/04/18 for MGM and BMD.

## 2018-06-26 DIAGNOSIS — Z888 Allergy status to other drugs, medicaments and biological substances status: Secondary | ICD-10-CM | POA: Diagnosis not present

## 2018-06-26 DIAGNOSIS — H7291 Unspecified perforation of tympanic membrane, right ear: Secondary | ICD-10-CM | POA: Diagnosis not present

## 2018-06-26 DIAGNOSIS — Z48810 Encounter for surgical aftercare following surgery on the sense organs: Secondary | ICD-10-CM | POA: Diagnosis not present

## 2018-06-26 DIAGNOSIS — Z9889 Other specified postprocedural states: Secondary | ICD-10-CM | POA: Diagnosis not present

## 2018-06-26 DIAGNOSIS — Z9622 Myringotomy tube(s) status: Secondary | ICD-10-CM | POA: Diagnosis not present

## 2018-06-28 ENCOUNTER — Encounter: Payer: Medicare Other | Admitting: Gynecology

## 2018-07-02 ENCOUNTER — Telehealth: Payer: Self-pay | Admitting: *Deleted

## 2018-07-02 NOTE — Telephone Encounter (Signed)
Patient called and left message on voicemail requesting the last dexa date. I called and left on voicemail 07/2015.

## 2018-07-03 ENCOUNTER — Ambulatory Visit (INDEPENDENT_AMBULATORY_CARE_PROVIDER_SITE_OTHER): Payer: Medicare Other | Admitting: Family Medicine

## 2018-07-03 ENCOUNTER — Encounter: Payer: Self-pay | Admitting: Family Medicine

## 2018-07-03 VITALS — BP 126/70 | HR 57 | Temp 97.8°F | Resp 16 | Ht 60.0 in | Wt 129.0 lb

## 2018-07-03 DIAGNOSIS — E663 Overweight: Secondary | ICD-10-CM | POA: Diagnosis not present

## 2018-07-03 DIAGNOSIS — G8929 Other chronic pain: Secondary | ICD-10-CM

## 2018-07-03 DIAGNOSIS — M545 Low back pain, unspecified: Secondary | ICD-10-CM

## 2018-07-03 DIAGNOSIS — F419 Anxiety disorder, unspecified: Secondary | ICD-10-CM

## 2018-07-03 DIAGNOSIS — G894 Chronic pain syndrome: Secondary | ICD-10-CM | POA: Diagnosis not present

## 2018-07-03 DIAGNOSIS — M542 Cervicalgia: Secondary | ICD-10-CM | POA: Diagnosis not present

## 2018-07-03 DIAGNOSIS — I10 Essential (primary) hypertension: Secondary | ICD-10-CM

## 2018-07-03 MED ORDER — HYDROCODONE-ACETAMINOPHEN 5-325 MG PO TABS: ORAL_TABLET | ORAL | 0 refills | Status: DC

## 2018-07-03 MED ORDER — ONDANSETRON HCL 4 MG PO TABS: 4.0000 mg | ORAL_TABLET | Freq: Three times a day (TID) | ORAL | 1 refills | Status: DC | PRN

## 2018-07-03 NOTE — Progress Notes (Signed)
OFFICE VISIT  07/04/2018   CC:  Chief Complaint  Patient presents with  . Follow-up    RCI, pt is fasting.    HPI:    Patient is a 73 y.o. Caucasian female who presents for 3 mo f/u chronic pain syndrome, HTN, GAD with adjustment d/o with anx/dep features.  Indication for chronic opioid: chronic low back and neck pain from DDD, not a candidate for further surgical intervention, failed ESI. Medication and dose: vicodin 5/325, 1 tid prn # pills per month: 90 Last UDS date: 12/19/17 Opioid Treatment Agreement signed (Y/N): 12/19/17 Opioid Treatment Agreement last reviewed with patient:  today Alison Cuevas reviewed this encounter (include red flags):  today, no red flags. Vicodin last RF'd 03/21/18, #90.  Takes meloxicam 15mg  most days and vicodin. She is unable to tell me how much she takes the meloxicam.  It is her first choice pain med, but if severe she uses 1/2-1 vicodin.     HTN: typically <130/80.  Goes up some briefly with severe pain.    GAD w/adjustment d/o--> still pretty stressed and anxious.  Gets depressed but only for a day or so at a time. Never SI or HI.    Her chronic OE has her stressed and in pain.  Still goes to her ENT weekly--currently getting repeated treatments for fungal OE.  ROS: no CP, no SOB, no wheezing, no cough, no dizziness, no HAs, no rashes, no melena/hematochezia.  No polyuria or polydipsia.  No myalgias or arthralgias.  Past Medical History:  Diagnosis Date  . Allergic rhinitis    maple pollen  . Anxiety   . Arthritis   . Bowel obstruction ([place])   . Chronic low back pain    Dr. Trenton Gammon.  Has had back injection--bp elevated after.  . Chronic pain syndrome   . DDD (degenerative disc disease), cervical    Hx of ACDF (Dr. [name]).  Followed by Dr. [name].  Also, Dr. [name] do left C7-T1 intralaminal epidural injection.  . Diverticulosis of colon   . DJD (degenerative joint disease)   . Gallstones   . GERD (gastroesophageal  reflux disease)   . Herpes zoster 07/08/2014  . Hypercholesteremia    mild; pt declined statin trial 05/2014--needs recheck lipid panel at first f/u visit in 2017  . Hypertension   . Osteopenia 06/2017   T score -1.4 FRAX 15% / 2%  . Ovarian cyst 2017   82019-robotic assisted bilatel SPO: all path benign.  . Perianal dermatitis    prn cutivate  . Phlebitis   . Right knee injury 2018   Patellofemoral crush injury--Dr. [name].  . Trochanteric bursitis of right hip    Recurrent (injection by Dr. [name] 05/26/15)  . 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 Benjamine Mola, will likely get hearing aid.  F/u 03/2017 w/Dr. Yates Decamp sign of infection, option of TM repair or amplification--pt to think abnout it.  As of 05/2017, plan is ossiculoplasty and tympanoplasty with Dr. May on 08/30/16 [facility]).  . Varicose vein    left leg     Past Surgical History:  Procedure Laterality Date  . ABDOMINAL HYSTERECTOMY     TAH  . ANTERIOR AND POSTERIOR REPAIR N/A 03/18/2014   Procedure: Cystocele repair with graft, Vault suspension, Rectocele repair;  Surgeon: [name], MD;  Location: [facility];  Service: Urology;  Laterality: N/A;  . CHOLECYSTECTOMY    . CHOLECYSTECTOMY, LAPAROSCOPIC    . COLON SURGERY    .  COLONOSCOPY  04/2006; 05/26/16   2007 (Dr. [name]): Normal.  04/2016 (Dr. [name]) normal except diverticulosis and decreased anal sphincter tone.  No repeat colonoscopy is recommended due to age.  . cspine surgery     Dr. Wiliam Ke level ant cerv discectomy /fusion w/plating  . CYSTO N/A 03/18/2014   Procedure: CYSTO;  Surgeon: [name], MD;  Location: [facility];  Service: Urology;  Laterality: N/A;  . DEXA  07/30/2015; 06/2018   2017 and 2020 -->Osteopenia--repeat 2 yrs.  Marland Kitchen LYSIS OF ADHESION N/A 01/31/2018   Procedure: POSSIBLE LYSIS OF ADHESION;  Surgeon: Everitt Amber, MD;  Location: [facility];  Service: Gynecology;  Laterality: N/A;  . [place]     with prolasped bledder repair  . RESECTION OF COLON     BENIGN TUMOR  . RIGHT EAR SURGERY  08/23/2017   Dr. 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 right hemicolectomy for diverticulitis with abscess  1993  . ROBOTIC ASSISTED BILATERAL SALPINGO OOPHERECTOMY Bilateral 01/31/2018   All PATH benign.  Procedure: XI ROBOTIC ASSISTED BILATERAL SALPINGO OOPHORECTOMY;  Surgeon: Everitt Amber, MD;  Location: [facility];  Service: Gynecology;  Laterality: Bilateral;  . TUBAL LIGATION      Outpatient Medications Prior to Visit  Medication Sig Dispense Refill  . ALPRAZolam (XANAX) 0.5 MG tablet Take 1 tablet (0.5 mg total) by mouth 3 (three) times daily as needed for anxiety. 90 tablet 5  . aspirin EC 81 MG tablet Take 81 mg by mouth daily.    . Biotin 10000 MCG TABS Take 10,000 mcg by mouth daily.     . calcium carbonate (OSCAL) 1500 (600 Ca) MG TABS tablet Take 600 mg of elemental calcium by mouth daily with breakfast.    . Carboxymethylcellul-Glycerin (LUBRICATING EYE DROPS OP) Place 1 drop into both eyes daily as needed (dry eyes).    . Cholecalciferol (VITAMIN D3) 5000 units CAPS Take 5,000 Units by mouth daily.    Marland Kitchen CRANBERRY EXTRACT PO Take 1 tablet by mouth daily.     Marland Kitchen estradiol (ESTRACE VAGINAL) 0.1 MG/GM vaginal cream Apply 1/4 applicator 3 times weekly vaginally. 42.5 g 2  . fluticasone (CUTIVATE) 0.05 % cream Apply to affected area bid prn (Patient taking differently: Apply 1 application topically 2 (two) times daily as needed (irritation). ) 30 g 1  . Lactobacillus-Inulin (CULTURELLE DIGESTIVE HEALTH PO) Take 1 tablet by mouth daily as needed (upset stomach).     . lisinopril (PRINIVIL,ZESTRIL) 10 MG tablet TAKE 1 TABLET BY MOUTH EVERY DAY 90 tablet 0  . loratadine (CLARITIN) 10 MG tablet Take 10 mg by mouth daily.    . meloxicam (MOBIC) 15 MG  tablet TAKE 1 TABLET EVERY DAY WITH FOOD AS NEEDED FOR MUSCULOSKELETAL PAIN 90 tablet 1  . Multiple Vitamins-Minerals (MULTIVITAMIN ADULT) TABS Take by mouth daily.    Marland Kitchen POTASSIUM PO Take 1 tablet by mouth daily.     Marland Kitchen PREVIDENT 5000 BOOSTER PLUS 1.1 % PSTE BRUSH TEETH 2 TO 3 TIMES A DAY. AFTER USE SPIT OUT, BUT DO NOT RINSE.  6  . Red Yeast Rice 600 MG CAPS Take 600 mg by mouth daily.     . Simethicone (GAS-X PO) Take 1 tablet by mouth daily as needed (gas).    . verapamil (CALAN-SR) 180 MG CR tablet TAKE 1 TABLET BY MOUTH EVERYDAY AT BEDTIME 90  tablet 1  . vitamin B-12 (CYANOCOBALAMIN) 1000 MCG tablet Take 2,000 mcg by mouth daily.    Marland Kitchen HYDROcodone-acetaminophen (NORCO/VICODIN) 5-325 MG tablet 1 tab po tid prn pain (Patient taking differently: Take 1 tablet by mouth 3 (three) times daily as needed for severe pain. ) 90 tablet 0  . ondansetron (ZOFRAN) 4 MG tablet Take 4 mg by mouth every 8 (eight) hours as needed for nausea or vomiting.    Marland Kitchen CIPRODEX OTIC suspension Place 4-5 drops into the right ear 2 (two) times daily.   0  . fluticasone (FLONASE) 50 MCG/ACT nasal spray Place 2 sprays into both nostrils at bedtime. (Patient not taking: Reported on 07/03/2018) 48 g 3  . ofloxacin (FLOXIN) 0.3 % OTIC solution Place 5 drops into the right ear 2 (two) times daily. For up to 7 days for ear pain or drainage.  0  . fexofenadine (ALLEGRA) 180 MG tablet Take 180 mg by mouth daily as needed for allergies or rhinitis.     No facility-administered medications prior to visit.     Allergies  Allergen Reactions  . Gabapentin Other (See Comments)    Elevated heart rate and crazy dreams  . Prednisone     REACTION: funny feeling    ROS As per HPI  PE: Blood pressure 126/70, pulse (!) 57, temperature 97.8 F (36.6 C), temperature source Oral, resp. rate 16, height 5' (1.524 m), weight 129 lb (58.5 kg), last menstrual period 06/27/1972, SpO2 99 %. Body mass index is 25.19 kg/m.  Gen: Alert, well  appearing.  Patient is oriented to person, place, time, and situation. AFFECT: pleasant, lucid thought and speech. DJM:EQAS: no injection, icteris, swelling, or exudate.  EOMI, PERRLA. Mouth: lips without lesion/swelling.  Oral mucosa pink and moist. Oropharynx without erythema, exudate, or swelling.  CV: RRR, no m/r/g.   LUNGS: CTA bilat, nonlabored resps, good aeration in all lung fields. EXT: no clubbing or cyanosis.  no edema.    LABS:  Lab Results  Component Value Date   TSH 0.790 08/31/2017      Chemistry      Component Value Date/Time   NA 133 (L) 03/22/2018 1124   K 4.5 03/22/2018 1124   CL 98 03/22/2018 1124   CO2 27 03/22/2018 1124   BUN 13 03/22/2018 1124   CREATININE 0.83 03/22/2018 1124   CREATININE 0.82 12/19/2017 1207      Component Value Date/Time   CALCIUM 9.3 03/22/2018 1124   ALKPHOS 67 01/24/2018 1123   AST 29 01/24/2018 1123   ALT 21 01/24/2018 1123   BILITOT 1.1 01/24/2018 1123     Lab Results  Component Value Date   WBC 5.4 01/24/2018   HGB 13.5 01/24/2018   HCT 38.5 01/24/2018   MCV 86.5 01/24/2018   PLT 189 01/24/2018   Lab Results  Component Value Date   HGBA1C 4.9 01/30/2017   Lab Results  Component Value Date   CHOL 165 03/22/2018   HDL 60.90 03/22/2018   LDLCALC 89 03/22/2018   LDLDIRECT 136.9 09/20/2007   TRIG 75.0 03/22/2018   CHOLHDL 3 03/22/2018    IMPRESSION AND PLAN:  1) Chronic pain syndrome: The current medical regimen is effective;  continue present plan and medications. I did electronic rx's for vicodin 5/325, 1 tid prn, #90 today (1 rx only).  Appropriate fill on/after date was noted on each rx.  2) HTN: The current medical regimen is effective;  continue present plan and medications. Lytes/cr good 3 mo ago.  Will repeat at next f/u visit.  3) GAD with adjustment d/o with anx/dep mood: the complication with this is her chronic ear infection (see PMH section). She continues to struggle with this problem and it  gets her very down at times.   An After Visit Summary was printed and given to the patient.  FOLLOW UP: Return in about 3 months (around 10/02/2018) for routine chronic illness f/u.  Signed:  Crissie Sickles, MD           07/04/2018

## 2018-07-04 ENCOUNTER — Encounter (HOSPITAL_COMMUNITY): Payer: Self-pay

## 2018-07-04 ENCOUNTER — Encounter: Payer: Self-pay | Admitting: Gynecology

## 2018-07-04 ENCOUNTER — Ambulatory Visit (HOSPITAL_COMMUNITY)
Admission: RE | Admit: 2018-07-04 | Discharge: 2018-07-04 | Disposition: A | Discharge status: Home / Self Care | Payer: Medicare Other | Source: Ambulatory Visit | Attending: Family Medicine | Admitting: Family Medicine

## 2018-07-04 DIAGNOSIS — Z1231 Encounter for screening mammogram for malignant neoplasm of breast: Secondary | ICD-10-CM | POA: Diagnosis not present

## 2018-07-04 DIAGNOSIS — E2839 Other primary ovarian failure: Secondary | ICD-10-CM | POA: Insufficient documentation

## 2018-07-04 DIAGNOSIS — M8589 Other specified disorders of bone density and structure, multiple sites: Secondary | ICD-10-CM | POA: Diagnosis not present

## 2018-07-12 DIAGNOSIS — Z9889 Other specified postprocedural states: Secondary | ICD-10-CM | POA: Diagnosis not present

## 2018-07-12 DIAGNOSIS — H7291 Unspecified perforation of tympanic membrane, right ear: Secondary | ICD-10-CM | POA: Diagnosis not present

## 2018-07-12 DIAGNOSIS — Z48810 Encounter for surgical aftercare following surgery on the sense organs: Secondary | ICD-10-CM | POA: Diagnosis not present

## 2018-07-16 ENCOUNTER — Telehealth: Payer: Self-pay | Admitting: Family Medicine

## 2018-07-16 ENCOUNTER — Encounter: Payer: Self-pay | Admitting: Family Medicine

## 2018-07-16 ENCOUNTER — Telehealth: Payer: Self-pay

## 2018-07-16 ENCOUNTER — Ambulatory Visit (INDEPENDENT_AMBULATORY_CARE_PROVIDER_SITE_OTHER): Payer: Medicare Other | Admitting: Family Medicine

## 2018-07-16 ENCOUNTER — Encounter: Payer: Self-pay | Admitting: *Deleted

## 2018-07-16 VITALS — BP 139/74 | HR 76 | Temp 97.7°F | Resp 16 | Ht 60.0 in | Wt 127.2 lb

## 2018-07-16 DIAGNOSIS — R1013 Epigastric pain: Secondary | ICD-10-CM | POA: Diagnosis not present

## 2018-07-16 DIAGNOSIS — S0083XA Contusion of other part of head, initial encounter: Secondary | ICD-10-CM

## 2018-07-16 MED ORDER — FAMOTIDINE 40 MG PO TABS: 40.0000 mg | ORAL_TABLET | Freq: Two times a day (BID) | ORAL | 0 refills | Status: DC

## 2018-07-16 NOTE — Telephone Encounter (Signed)
LM requesting call back to discuss hitting head/fall. Pt is scheduled to see PCP today.

## 2018-07-16 NOTE — Patient Instructions (Signed)
Try taking one magnesium oxide 500 mg tab every night and one vitamin B2 tab (also called riboflavin) 100 mg every night--> hopefully this will help you with your restless legs at night.

## 2018-07-16 NOTE — Telephone Encounter (Signed)
Pt returning call from [place]. Did not see any additional messages. Pt states she is aware of Dr. Idelle Leech instructions regarding supplements for RLS. Pt states if any additional messages,need to speak with her,  may leave on VM.

## 2018-07-16 NOTE — Telephone Encounter (Signed)
This encounter was created in error - please disregard.

## 2018-07-16 NOTE — Progress Notes (Signed)
OFFICE VISIT  07/16/2018   CC:  Chief Complaint  Patient presents with  . Head Injury   HPI:    Patient is a 73 y.o. Caucasian female who presents for head injury. Hit head 5 days ago:  Got up from sleep in the night, drank some orange juice, went back to sleep and then woke up an hour later feeling "sick on stomach", + cramps.  Got out of bed to go to the bathroom, was having some vomiting/diarrhea and felt herself begin to feel like she was going to pass out and she briefly passed out and hit her head on the door facing. It all happened in only a second or two.  She was able to get up, clean herself up and get back to bed. No memory loss, headaches, weakness, vision problems, hearing problems.  She commonly has some balance/lightheaded issues upon standing, but this is longstanding and likely related to her chronic R ear issues. She has a bruise on L side of forehead.  She does feel like she is thinking clearly.  No personality changes.  No longer having any n/v/d.  Says she has had mid epigastric burning a lot more lately, unclear duration. Takes meloxicam.  Past Medical History:  Diagnosis Date  . Allergic rhinitis    maple pollen  . Anxiety   . Arthritis   . Bowel obstruction ([place])   . Chronic low back pain    Dr. Trenton Gammon.  Has had back injection--bp elevated after.  . Chronic pain syndrome   . DDD (degenerative disc disease), cervical    Hx of ACDF (Dr. [name]).  Followed by Dr. [name].  Also, Dr. [name] do left C7-T1 intralaminal epidural injection.  . Diverticulosis of colon   . DJD (degenerative joint disease)   . Gallstones   . GERD (gastroesophageal reflux disease)   . Herpes zoster 07/08/2014  . Hypercholesteremia    mild; pt declined statin trial 05/2014--needs recheck lipid panel at first f/u visit in 2017  . Hypertension   . Osteopenia 06/2017   T score -1.4 FRAX 15% / 2%  . Ovarian cyst 2017   82019-robotic assisted bilatel SPO: all path  benign.  . Perianal dermatitis    prn cutivate  . Phlebitis   . Right knee injury 2018   Patellofemoral crush injury--Dr. [name].  . Trochanteric bursitis of right hip    Recurrent (injection by Dr. [name] 05/26/15)  . 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 Benjamine Mola, will likely get hearing aid.  F/u 03/2017 w/Dr. Yates Decamp sign of infection, option of TM repair or amplification--pt to think abnout it.  As of 05/2017, plan is ossiculoplasty and tympanoplasty with Dr. May on 08/30/16 [facility]).  . Varicose vein    left leg     Past Surgical History:  Procedure Laterality Date  . ABDOMINAL HYSTERECTOMY     TAH  . ANTERIOR AND POSTERIOR REPAIR N/A 03/18/2014   Procedure: Cystocele repair with graft, Vault suspension, Rectocele repair;  Surgeon: [name], MD;  Location: [facility];  Service: Urology;  Laterality: N/A;  . CHOLECYSTECTOMY    . CHOLECYSTECTOMY, LAPAROSCOPIC    . COLON SURGERY    . COLONOSCOPY  04/2006; 05/26/16   2007 (Dr. [name]): Normal.  04/2016 (Dr. [name]) normal except diverticulosis and decreased anal sphincter tone.  No repeat colonoscopy is recommended due to age.  . cspine surgery     Dr. Wiliam Ke level ant cerv discectomy /fusion w/plating  .  CYSTO N/A 03/18/2014   Procedure: CYSTO;  Surgeon: [name], MD;  Location: [facility];  Service: Urology;  Laterality: N/A;  . DEXA  07/30/2015; 06/2018   2017 and 2020 -->Osteopenia--repeat 2 yrs.  Marland Kitchen LYSIS OF ADHESION N/A 01/31/2018   Procedure: POSSIBLE LYSIS OF ADHESION;  Surgeon: Everitt Amber, MD;  Location: [facility];  Service: Gynecology;  Laterality: N/A;  . [place]     with prolasped bledder repair  . RESECTION OF COLON     BENIGN TUMOR  . RIGHT EAR SURGERY  08/23/2017   Dr. 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  right hemicolectomy for diverticulitis with abscess  1993  . ROBOTIC ASSISTED BILATERAL SALPINGO OOPHERECTOMY Bilateral 01/31/2018   All PATH benign.  Procedure: XI ROBOTIC ASSISTED BILATERAL SALPINGO OOPHORECTOMY;  Surgeon: Everitt Amber, MD;  Location: [facility];  Service: Gynecology;  Laterality: Bilateral;  . TUBAL LIGATION      Outpatient Medications Prior to Visit  Medication Sig Dispense Refill  . ALPRAZolam (XANAX) 0.5 MG tablet Take 1 tablet (0.5 mg total) by mouth 3 (three) times daily as needed for anxiety. 90 tablet 5  . aspirin EC 81 MG tablet Take 81 mg by mouth daily.    . Biotin 10000 MCG TABS Take 10,000 mcg by mouth daily.     . calcium carbonate (OSCAL) 1500 (600 Ca) MG TABS tablet Take 600 mg of elemental calcium by mouth daily with breakfast.    . Carboxymethylcellul-Glycerin (LUBRICATING EYE DROPS OP) Place 1 drop into both eyes daily as needed (dry eyes).    . Cholecalciferol (VITAMIN D3) 5000 units CAPS Take 5,000 Units by mouth daily.    Marland Kitchen CIPRODEX OTIC suspension Place 4-5 drops into the right ear 2 (two) times daily.   0  . CRANBERRY EXTRACT PO Take 1 tablet by mouth daily.     Marland Kitchen estradiol (ESTRACE VAGINAL) 0.1 MG/GM vaginal cream Apply 1/4 applicator 3 times weekly vaginally. 42.5 g 2  . fluticasone (CUTIVATE) 0.05 % cream Apply to affected area bid prn (Patient taking differently: Apply 1 application topically 2 (two) times daily as needed (irritation). ) 30 g 1  . fluticasone (FLONASE) 50 MCG/ACT nasal spray Place 2 sprays into both nostrils at bedtime. 48 g 3  . HYDROcodone-acetaminophen (NORCO/VICODIN) 5-325 MG tablet 1 tab po tid prn pain 90 tablet 0  . Lactobacillus-Inulin (CULTURELLE DIGESTIVE HEALTH PO) Take 1 tablet by mouth daily as needed (upset stomach).     . lisinopril (PRINIVIL,ZESTRIL) 10 MG tablet TAKE 1 TABLET BY MOUTH EVERY DAY 90 tablet 0  . loratadine (CLARITIN) 10 MG tablet Take 10 mg by mouth daily.    . meloxicam (MOBIC) 15 MG tablet TAKE 1 TABLET  EVERY DAY WITH FOOD AS NEEDED FOR MUSCULOSKELETAL PAIN 90 tablet 1  . Multiple Vitamins-Minerals (MULTIVITAMIN ADULT) TABS Take by mouth daily.    . ondansetron (ZOFRAN) 4 MG tablet Take 1 tablet (4 mg total) by mouth every 8 (eight) hours as needed for nausea or vomiting. 30 tablet 1  . POTASSIUM PO Take 1 tablet by mouth daily.     Marland Kitchen PREVIDENT 5000 BOOSTER PLUS 1.1 % PSTE BRUSH TEETH 2 TO 3 TIMES A DAY. AFTER USE SPIT OUT, BUT DO NOT RINSE.  6  . Red Yeast Rice 600 MG CAPS Take 600 mg by mouth daily.     Marland Kitchen  Simethicone (GAS-X PO) Take 1 tablet by mouth daily as needed (gas).    . verapamil (CALAN-SR) 180 MG CR tablet TAKE 1 TABLET BY MOUTH EVERYDAY AT BEDTIME 90 tablet 1  . vitamin B-12 (CYANOCOBALAMIN) 1000 MCG tablet Take 2,000 mcg by mouth daily.    Marland Kitchen ofloxacin (FLOXIN) 0.3 % OTIC solution Place 5 drops into the right ear 2 (two) times daily. For up to 7 days for ear pain or drainage.  0   No facility-administered medications prior to visit.     Allergies  Allergen Reactions  . Gabapentin Other (See Comments)    Elevated heart rate and crazy dreams  . Prednisone     REACTION: funny feeling    ROS As per HPI  PE: Blood pressure 139/74, pulse 76, temperature 97.7 F (36.5 C), temperature source Oral, resp. rate 16, height 5' (1.524 m), weight 127 lb 4 oz (57.7 kg), last menstrual period 06/27/1972, SpO2 98 %. Gen: Alert, well appearing.  Patient is oriented to person, place, time, and situation. AFFECT: pleasant, lucid thought and speech. Bruise to L side of forehead, minimal STS here.  No tenderness to palpation. QQI:WLNL: no injection, icteris, swelling, or exudate.  EOMI, PERRLA. Mouth: lips without lesion/swelling.  Oral mucosa pink and moist. Oropharynx without erythema, exudate, or swelling.  Neuro: CN 2-12 intact bilaterally, strength 5/5 in proximal and distal upper extremities and lower extremities bilaterally.  No sensory deficits.  No tremor.  No disdiadochokinesis.  No  ataxia.  Upper extremity and lower extremity DTRs symmetric.  No pronator drift.  LABS:    Chemistry      Component Value Date/Time   NA 133 (L) 03/22/2018 1124   K 4.5 03/22/2018 1124   CL 98 03/22/2018 1124   CO2 27 03/22/2018 1124   BUN 13 03/22/2018 1124   CREATININE 0.83 03/22/2018 1124   CREATININE 0.82 12/19/2017 1207      Component Value Date/Time   CALCIUM 9.3 03/22/2018 1124   ALKPHOS 67 01/24/2018 1123   AST 29 01/24/2018 1123   ALT 21 01/24/2018 1123   BILITOT 1.1 01/24/2018 1123     Lab Results  Component Value Date   WBC 5.4 01/24/2018   HGB 13.5 01/24/2018   HCT 38.5 01/24/2018   MCV 86.5 01/24/2018   PLT 189 01/24/2018    IMPRESSION AND PLAN:   1) Left forehead contusion: ice, tylenol.  Avoid meloxicam for now No sign of complication.  2) epigastric burning pain: dyspepsia vs gastritis.  Stop NSAID for now.  Start pepcid 40mg  bid x 1 mo.  An After Visit Summary was printed and given to the patient.  FOLLOW UP: No follow-ups on file.  Signed:  Crissie Sickles, MD           07/16/2018

## 2018-07-19 ENCOUNTER — Other Ambulatory Visit: Payer: Self-pay | Admitting: Family Medicine

## 2018-07-19 DIAGNOSIS — H663X1 Other chronic suppurative otitis media, right ear: Secondary | ICD-10-CM | POA: Diagnosis not present

## 2018-07-19 DIAGNOSIS — R2689 Other abnormalities of gait and mobility: Secondary | ICD-10-CM | POA: Diagnosis not present

## 2018-07-19 DIAGNOSIS — H7291 Unspecified perforation of tympanic membrane, right ear: Secondary | ICD-10-CM | POA: Diagnosis not present

## 2018-07-19 DIAGNOSIS — H9011 Conductive hearing loss, unilateral, right ear, with unrestricted hearing on the contralateral side: Secondary | ICD-10-CM | POA: Diagnosis not present

## 2018-07-19 NOTE — Telephone Encounter (Signed)
My chart message came back unread, I called and left detailed message on home # per DPR access.

## 2018-07-20 NOTE — Telephone Encounter (Signed)
RF request for alprazolam LOV: 07/03/18 Next ov: 10/03/18 Last written: 11/06/17 #90 w/ 5RF  Please advise. Thanks.

## 2018-07-22 ENCOUNTER — Encounter: Payer: Self-pay | Admitting: Family Medicine

## 2018-07-23 ENCOUNTER — Other Ambulatory Visit: Payer: Self-pay | Admitting: Family Medicine

## 2018-07-23 ENCOUNTER — Telehealth: Payer: Self-pay | Admitting: *Deleted

## 2018-07-23 NOTE — Telephone Encounter (Signed)
Please advise. Thanks.  

## 2018-07-23 NOTE — Telephone Encounter (Signed)
Pt advised and voiced understanding.   

## 2018-07-23 NOTE — Telephone Encounter (Signed)
She can go ahead and restart it now.-thx

## 2018-07-23 NOTE — Telephone Encounter (Signed)
Copied from [facility] [phone]. Topic: General - Inquiry >> Jul 23, 2018 12:47 PM Conception Chancy, NT wrote: Reason for CRM: patient is calling and states she is needing to know wen Dr. Anitra Lauth wants her to go back on meloxicam (MOBIC) 15 MG tablet.

## 2018-07-27 ENCOUNTER — Other Ambulatory Visit: Payer: Self-pay | Admitting: Family Medicine

## 2018-07-27 DIAGNOSIS — H7291 Unspecified perforation of tympanic membrane, right ear: Secondary | ICD-10-CM | POA: Diagnosis not present

## 2018-07-27 DIAGNOSIS — Z9622 Myringotomy tube(s) status: Secondary | ICD-10-CM | POA: Diagnosis not present

## 2018-07-27 DIAGNOSIS — Z888 Allergy status to other drugs, medicaments and biological substances status: Secondary | ICD-10-CM | POA: Diagnosis not present

## 2018-07-27 DIAGNOSIS — H9211 Otorrhea, right ear: Secondary | ICD-10-CM | POA: Diagnosis not present

## 2018-07-30 NOTE — Telephone Encounter (Signed)
Rx sent on 07/20/18 failed. Please resend. Thanks.

## 2018-07-31 ENCOUNTER — Other Ambulatory Visit: Payer: Self-pay | Admitting: Family Medicine

## 2018-07-31 MED ORDER — ALPRAZOLAM 0.5 MG PO TABS: ORAL_TABLET | ORAL | 5 refills | Status: DC

## 2018-07-31 NOTE — Telephone Encounter (Signed)
Dr. Anitra Lauth printed and signed Rx. Rx faxed to pts pharmacy.

## 2018-07-31 NOTE — Progress Notes (Signed)
Tried to send alprazolam RF by electronic rx but computer said this "failed to send" twice.  I printed out rx for fax to pharmacy today.

## 2018-08-02 ENCOUNTER — Encounter: Payer: Medicare Other | Admitting: Gynecology

## 2018-08-09 DIAGNOSIS — H9211 Otorrhea, right ear: Secondary | ICD-10-CM | POA: Diagnosis not present

## 2018-08-09 DIAGNOSIS — H7291 Unspecified perforation of tympanic membrane, right ear: Secondary | ICD-10-CM | POA: Diagnosis not present

## 2018-08-09 DIAGNOSIS — Z9889 Other specified postprocedural states: Secondary | ICD-10-CM | POA: Diagnosis not present

## 2018-08-13 ENCOUNTER — Encounter: Payer: Self-pay | Admitting: Gynecology

## 2018-08-13 ENCOUNTER — Ambulatory Visit (INDEPENDENT_AMBULATORY_CARE_PROVIDER_SITE_OTHER): Payer: Medicare Other | Admitting: Gynecology

## 2018-08-13 VITALS — BP 118/78 | Ht 60.0 in | Wt 129.0 lb

## 2018-08-13 DIAGNOSIS — Z01419 Encounter for gynecological examination (general) (routine) without abnormal findings: Secondary | ICD-10-CM | POA: Diagnosis not present

## 2018-08-13 DIAGNOSIS — M8580 Other specified disorders of bone density and structure, unspecified site: Secondary | ICD-10-CM

## 2018-08-13 DIAGNOSIS — N952 Postmenopausal atrophic vaginitis: Secondary | ICD-10-CM

## 2018-08-13 NOTE — Progress Notes (Signed)
    Alison Cuevas December 10, 1945 [id number]        73 y.o.  [phone] for breast and pelvic exam.  Underwent robotic BSO by Dr. Denman George this past fall for benign ovarian cyst.  Past medical history,surgical history, problem list, medications, allergies, family history and social history were all reviewed and documented as reviewed in the EPIC chart.  ROS:  Performed with pertinent positives and negatives included in the history, assessment and plan.   Additional significant findings : None   Exam: Caryn Bee assistant Vitals:   08/13/18 0944  BP: 118/78  Weight: 129 lb (58.5 kg)  Height: 5' (1.524 m)   Body mass index is 25.19 kg/m.  General appearance:  Normal affect, orientation and appearance. Skin: Grossly normal HEENT: Without gross lesions.  No cervical or supraclavicular adenopathy. Thyroid normal.  Lungs:  Clear without wheezing, rales or rhonchi Cardiac: RR, without RMG Abdominal:  Soft, nontender, without masses, guarding, rebound, organomegaly or hernia Breasts:  Examined lying and sitting without masses, retractions, discharge or axillary adenopathy. Pelvic:  Ext, BUS, Vagina: With atrophic changes  Adnexa: Without masses or tenderness    Anus and perineum: Normal   Rectovaginal: Normal sphincter tone without palpated masses or tenderness.    Assessment/Plan:  73 y.o. [id number] female for rest and pelvic exam. Aware.  Follow-up 1 year, sooner as needed. 1. Postmenopausal.  Status post TAH in the past.  Subsequent recent BSO for benign ovarian cyst.  Anterior and posterior repair by Dr. Matilde Sprang with graft and vault suspension.  Doing well without significant menopausal symptoms. 2. Mammography 06/2018.  Continue with annual mammography next year.  Breast exam normal today. 3. Osteopenia.  DEXA 2020 T score -1.4 distal third of the radius.  Stable from prior DEXA. 4. Pap smear 2016.  No Pap smear done today.  No history of abnormal Pap smears.  We both agree to stop  screening per current screening guidelines based on age and hysterectomy history. 5. Colonoscopy 2017.  Repeat at their recommended interval. 6. Health maintenance.  No routine lab work done as patient does this elsewhere.  Follow-up 1 year, sooner as needed.   [name] MD, 10:12 AM 08/13/2018

## 2018-08-13 NOTE — Patient Instructions (Signed)
Follow-up in 1 year for annual exam, sooner as needed. 

## 2018-08-23 ENCOUNTER — Other Ambulatory Visit: Payer: Self-pay | Admitting: Family Medicine

## 2018-08-30 DIAGNOSIS — H608X1 Other otitis externa, right ear: Secondary | ICD-10-CM | POA: Diagnosis not present

## 2018-08-30 DIAGNOSIS — B369 Superficial mycosis, unspecified: Secondary | ICD-10-CM | POA: Diagnosis not present

## 2018-08-30 DIAGNOSIS — H6240 Otitis externa in other diseases classified elsewhere, unspecified ear: Secondary | ICD-10-CM | POA: Diagnosis not present

## 2018-08-30 DIAGNOSIS — H7291 Unspecified perforation of tympanic membrane, right ear: Secondary | ICD-10-CM | POA: Diagnosis not present

## 2018-08-30 DIAGNOSIS — B49 Unspecified mycosis: Secondary | ICD-10-CM | POA: Diagnosis not present

## 2018-09-05 DIAGNOSIS — B369 Superficial mycosis, unspecified: Secondary | ICD-10-CM | POA: Diagnosis not present

## 2018-09-05 DIAGNOSIS — H6241 Otitis externa in other diseases classified elsewhere, right ear: Secondary | ICD-10-CM | POA: Diagnosis not present

## 2018-09-05 DIAGNOSIS — H7291 Unspecified perforation of tympanic membrane, right ear: Secondary | ICD-10-CM | POA: Diagnosis not present

## 2018-09-05 DIAGNOSIS — H6240 Otitis externa in other diseases classified elsewhere, unspecified ear: Secondary | ICD-10-CM | POA: Diagnosis not present

## 2018-09-07 ENCOUNTER — Other Ambulatory Visit: Payer: Self-pay | Admitting: Family Medicine

## 2018-09-07 MED ORDER — VERAPAMIL HCL ER 180 MG PO TBCR: EXTENDED_RELEASE_TABLET | ORAL | 1 refills | Status: DC

## 2018-09-09 ENCOUNTER — Encounter: Payer: Self-pay | Admitting: Family Medicine

## 2018-09-12 DIAGNOSIS — H7291 Unspecified perforation of tympanic membrane, right ear: Secondary | ICD-10-CM | POA: Diagnosis not present

## 2018-09-12 DIAGNOSIS — Z8669 Personal history of other diseases of the nervous system and sense organs: Secondary | ICD-10-CM | POA: Diagnosis not present

## 2018-09-20 ENCOUNTER — Encounter: Payer: Self-pay | Admitting: Family Medicine

## 2018-09-21 ENCOUNTER — Other Ambulatory Visit: Payer: Self-pay

## 2018-09-21 MED ORDER — MELOXICAM 15 MG PO TABS: ORAL_TABLET | ORAL | 0 refills | Status: DC

## 2018-09-21 NOTE — Telephone Encounter (Signed)
Faxed RX request for Meloxicam. Per previous phone note, OK for pt to start using again. Pt has appt scheduled 10/17/2018. Refill sent to pharmacy to last until that appt.

## 2018-09-27 DIAGNOSIS — B369 Superficial mycosis, unspecified: Secondary | ICD-10-CM | POA: Diagnosis not present

## 2018-09-27 DIAGNOSIS — B488 Other specified mycoses: Secondary | ICD-10-CM | POA: Diagnosis not present

## 2018-09-27 DIAGNOSIS — Z48810 Encounter for surgical aftercare following surgery on the sense organs: Secondary | ICD-10-CM | POA: Diagnosis not present

## 2018-09-27 DIAGNOSIS — H6061 Unspecified chronic otitis externa, right ear: Secondary | ICD-10-CM | POA: Diagnosis not present

## 2018-09-27 DIAGNOSIS — H7291 Unspecified perforation of tympanic membrane, right ear: Secondary | ICD-10-CM | POA: Diagnosis not present

## 2018-09-27 DIAGNOSIS — H6241 Otitis externa in other diseases classified elsewhere, right ear: Secondary | ICD-10-CM | POA: Diagnosis not present

## 2018-10-03 ENCOUNTER — Ambulatory Visit: Payer: Medicare Other | Admitting: Family Medicine

## 2018-10-04 DIAGNOSIS — Z8669 Personal history of other diseases of the nervous system and sense organs: Secondary | ICD-10-CM | POA: Diagnosis not present

## 2018-10-04 DIAGNOSIS — B369 Superficial mycosis, unspecified: Secondary | ICD-10-CM | POA: Diagnosis not present

## 2018-10-04 DIAGNOSIS — H6241 Otitis externa in other diseases classified elsewhere, right ear: Secondary | ICD-10-CM | POA: Diagnosis not present

## 2018-10-04 DIAGNOSIS — H7291 Unspecified perforation of tympanic membrane, right ear: Secondary | ICD-10-CM | POA: Diagnosis not present

## 2018-10-12 ENCOUNTER — Encounter: Payer: Self-pay | Admitting: Family Medicine

## 2018-10-17 ENCOUNTER — Ambulatory Visit: Payer: Medicare Other | Admitting: Family Medicine

## 2018-10-17 ENCOUNTER — Other Ambulatory Visit: Payer: Self-pay | Admitting: Family Medicine

## 2018-10-17 DIAGNOSIS — Z9889 Other specified postprocedural states: Secondary | ICD-10-CM | POA: Diagnosis not present

## 2018-10-17 DIAGNOSIS — H6061 Unspecified chronic otitis externa, right ear: Secondary | ICD-10-CM | POA: Diagnosis not present

## 2018-10-17 DIAGNOSIS — H6241 Otitis externa in other diseases classified elsewhere, right ear: Secondary | ICD-10-CM | POA: Diagnosis not present

## 2018-10-17 DIAGNOSIS — H7291 Unspecified perforation of tympanic membrane, right ear: Secondary | ICD-10-CM | POA: Diagnosis not present

## 2018-10-17 DIAGNOSIS — B369 Superficial mycosis, unspecified: Secondary | ICD-10-CM | POA: Diagnosis not present

## 2018-10-21 ENCOUNTER — Other Ambulatory Visit: Payer: Self-pay | Admitting: Family Medicine

## 2018-10-22 NOTE — Telephone Encounter (Signed)
RF request sent from pharmacy. Last OV 07/16/2018 Next OV 11/02/2018 Last RX 09/21/18 # 30 X 0 RF. Please advise.

## 2018-10-24 ENCOUNTER — Telehealth: Payer: Self-pay | Admitting: Family Medicine

## 2018-10-24 NOTE — Telephone Encounter (Signed)
Please make phone appt for patient

## 2018-10-24 NOTE — Telephone Encounter (Signed)
When I contacted patient to switch 11/02/18 office visit to a virtual visit she stated she does not have internet or a smart phone. She will need refills. Can this be done by a telephone visit or can she reschedule her office visit for later in the month and still get her refills?

## 2018-10-25 DIAGNOSIS — H6061 Unspecified chronic otitis externa, right ear: Secondary | ICD-10-CM | POA: Diagnosis not present

## 2018-10-25 DIAGNOSIS — Z9889 Other specified postprocedural states: Secondary | ICD-10-CM | POA: Diagnosis not present

## 2018-10-25 DIAGNOSIS — Z48810 Encounter for surgical aftercare following surgery on the sense organs: Secondary | ICD-10-CM | POA: Diagnosis not present

## 2018-10-25 DIAGNOSIS — B369 Superficial mycosis, unspecified: Secondary | ICD-10-CM | POA: Diagnosis not present

## 2018-10-25 DIAGNOSIS — Z8669 Personal history of other diseases of the nervous system and sense organs: Secondary | ICD-10-CM | POA: Diagnosis not present

## 2018-10-25 DIAGNOSIS — B488 Other specified mycoses: Secondary | ICD-10-CM | POA: Diagnosis not present

## 2018-10-25 DIAGNOSIS — H6241 Otitis externa in other diseases classified elsewhere, right ear: Secondary | ICD-10-CM | POA: Diagnosis not present

## 2018-10-25 DIAGNOSIS — H7291 Unspecified perforation of tympanic membrane, right ear: Secondary | ICD-10-CM | POA: Diagnosis not present

## 2018-10-25 NOTE — Telephone Encounter (Signed)
Patient has telephone visit schedule for 11/02/18

## 2018-11-02 ENCOUNTER — Ambulatory Visit (INDEPENDENT_AMBULATORY_CARE_PROVIDER_SITE_OTHER): Payer: Medicare Other | Admitting: Family Medicine

## 2018-11-02 ENCOUNTER — Encounter: Payer: Self-pay | Admitting: Family Medicine

## 2018-11-02 ENCOUNTER — Telehealth: Payer: Self-pay

## 2018-11-02 VITALS — BP 123/65 | HR 64 | Wt 130.0 lb

## 2018-11-02 DIAGNOSIS — I10 Essential (primary) hypertension: Secondary | ICD-10-CM

## 2018-11-02 DIAGNOSIS — M542 Cervicalgia: Secondary | ICD-10-CM

## 2018-11-02 DIAGNOSIS — G8929 Other chronic pain: Secondary | ICD-10-CM | POA: Diagnosis not present

## 2018-11-02 DIAGNOSIS — F411 Generalized anxiety disorder: Secondary | ICD-10-CM

## 2018-11-02 DIAGNOSIS — G894 Chronic pain syndrome: Secondary | ICD-10-CM | POA: Diagnosis not present

## 2018-11-02 DIAGNOSIS — M5030 Other cervical disc degeneration, unspecified cervical region: Secondary | ICD-10-CM | POA: Diagnosis not present

## 2018-11-02 MED ORDER — HYDROCODONE-ACETAMINOPHEN 5-325 MG PO TABS: ORAL_TABLET | ORAL | 0 refills | Status: DC

## 2018-11-02 NOTE — Progress Notes (Signed)
Virtual Visit via Video Note  I connected with pt on 11/02/18 at 10:40 AM EDT by telephone (pt w/out computer or smart phone) and verified that I am speaking with the correct person using two identifiers.  Location patient: home Location provider:work or home office Persons participating in the virtual visit: patient, provider  I discussed the limitations of evaluation and management by telemedicine/telephone and the availability of in person appointments. The patient expressed understanding and agreed to proceed.  Telemedicine/telephone visit is a necessity given the COVID-19 restrictions in place at the current time.  HPI: 73 y/o WF with whom I am doing a telephone visit today (due to COVID-19 pandemic restrictions).  for f/u HTN, chronic pain syndrome, and GAD with recent adjustment d/o with anx/dep mood.  Her day to day wellness fluctuates depending on how much her chronic ear infection is acting up.  She still sees ENT regularly for this and gets antibiotic powder inserted.  Indication for chronic opioid: chronic low back and neck pain from DDD, not a candidate for further surgical intervention, failed ESI. Medication and dose: vicodin 5/325, 1 tid prn # pills per month: 90--this lasts approx 2-3 mo Last UDS date: 12/19/17 Opioid Treatment Agreement signed (Y/N): 12/19/17 Opioid Treatment Agreement last reviewed with patient:  today [place] reviewed this encounter (include red flags):  today, no red flags. Vicodin last RF'd 07/03/2018, #90.  Pain in neck and stiffness seem worse lately, also some arm pains.  "I've just got to live with it for now". Using meloxicam most days, additionally she takes 2-3 vicodin per day.  GAD w/adjustment d/o:  I rx alprazolam 0.5mg , 1 tid prn.   Usually takes this bid and sometimes tid.  No depressed mood.  She has never been agreeable to daily antidepressant treatment for her GAD.  HTN: not regulary checking bp at home but today's was great. Compliant  with lisinopril 10mg  qd.   For pain she tries using meloxicam first line, then uses vicodin about 1 tab per day avg as 2nd line tx.  ROS: no CP, no SOB, no wheezing, no cough, no dizziness, no HAs, no rashes, no melena/hematochezia.  No polyuria or polydipsia.    Past Medical History:  Diagnosis Date  . Allergic rhinitis    maple pollen  . Anxiety   . Arthritis   . Bowel obstruction ([place])   . Chronic low back pain    Dr. Trenton Gammon.  Has had back injection--bp elevated after.  . Chronic pain syndrome   . DDD (degenerative disc disease), cervical    Hx of ACDF (Dr. [name]).  Followed by Dr. [name].  Also, Dr. [name] do left C7-T1 intralaminal epidural injection.  . Diverticulosis of colon   . DJD (degenerative joint disease)   . Gallstones   . GERD (gastroesophageal reflux disease)   . Herpes zoster 07/08/2014  . Hypercholesteremia    mild; pt declined statin trial 05/2014--needs recheck lipid panel at first f/u visit in 2017  . Hypertension   . Osteopenia 06/2017   T score -1.4 FRAX 15% / 2%  . Ovarian cyst 2017   82019-robotic assisted bilatel SPO: all path benign.  . Perianal dermatitis    prn cutivate  . Phlebitis   . Right knee injury 2018   Patellofemoral crush injury--Dr. [name].  . Trochanteric bursitis of right hip    Recurrent (injection by Dr. [name] 05/26/15)  . Tympanic membrane rupture, right 12/01/2016   As of 08/2018 pt set for tympanomastoidectomy and STSG (Dr.  Gandolfi). Recurrent fungal/bact OE.  . Varicose vein    left leg     Past Surgical History:  Procedure Laterality Date  . ABDOMINAL HYSTERECTOMY  03/2015   TAH  . ANTERIOR AND POSTERIOR REPAIR N/A 03/18/2014   Procedure: Cystocele repair with graft, Vault suspension, Rectocele repair;  Surgeon: [name], MD;  Location: [facility];  Service: Urology;  Laterality: N/A;  . CHOLECYSTECTOMY    . CHOLECYSTECTOMY, LAPAROSCOPIC    . COLON SURGERY    . COLONOSCOPY  04/2006;  05/26/16   2007 (Dr. [name]): Normal.  04/2016 (Dr. [name]) normal except diverticulosis and decreased anal sphincter tone.  No repeat colonoscopy is recommended due to age.  . cspine surgery     Dr. Wiliam Ke level ant cerv discectomy /fusion w/plating  . CYSTO N/A 03/18/2014   Procedure: CYSTO;  Surgeon: [name], MD;  Location: [facility];  Service: Urology;  Laterality: N/A;  . DEXA  07/30/2015; 06/2018   2017 and 2020 -->Osteopenia--repeat 2 yrs.  Marland Kitchen LYSIS OF ADHESION N/A 01/31/2018   Procedure: POSSIBLE LYSIS OF ADHESION;  Surgeon: Everitt Amber, MD;  Location: [facility];  Service: Gynecology;  Laterality: N/A;  . [place]     with prolasped bledder repair  . RESECTION OF COLON     BENIGN TUMOR  . RIGHT EAR SURGERY  08/23/2017   Dr. 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 right hemicolectomy for diverticulitis with abscess  1993  . ROBOTIC ASSISTED BILATERAL SALPINGO OOPHERECTOMY Bilateral 01/31/2018   All PATH benign.  Procedure: XI ROBOTIC ASSISTED BILATERAL SALPINGO OOPHORECTOMY;  Surgeon: Everitt Amber, MD;  Location: [facility];  Service: Gynecology;  Laterality: Bilateral;  . TUBAL LIGATION      Family History  Problem Relation Age of Onset  . Hypertension Mother   . Diverticulitis Mother   . Breast cancer Sister 25  . Melanoma Brother   . Prostate cancer Brother   . Leukemia Brother   . Lupus Sister   . Lung disease Brother   . Stomach cancer Father   . Other Other        Family member with MGUS     Current Outpatient Medications:  .  ALPRAZolam (XANAX) 0.5 MG tablet, TAKE 1 TABLET BY MOUTH 3 TIMES DAILY AS NEEDED FOR ANXIETY, Disp: 90 tablet, Rfl: 5 .  aspirin EC 81 MG tablet, Take 81 mg by mouth daily., Disp: , Rfl:  .  Biotin 10000 MCG TABS, Take 10,000 mcg by mouth daily. , Disp: , Rfl:  .  Carboxymethylcellul-Glycerin  (LUBRICATING EYE DROPS OP), Place 1 drop into both eyes daily as needed (dry eyes)., Disp: , Rfl:  .  Cholecalciferol (VITAMIN D3) 5000 units CAPS, Take 5,000 Units by mouth daily., Disp: , Rfl:  .  fluticasone (CUTIVATE) 0.05 % cream, Apply to affected area bid prn (Patient taking differently: Apply 1 application topically 2 (two) times daily as needed (irritation). ), Disp: 30 g, Rfl: 1 .  HYDROcodone-acetaminophen (NORCO/VICODIN) 5-325 MG tablet, 1 tab po tid prn pain, Disp: 90 tablet, Rfl: 0 .  Lactobacillus-Inulin (CULTURELLE DIGESTIVE HEALTH PO), Take 1 tablet by mouth daily as needed (upset stomach). , Disp: , Rfl:  .  lisinopril (PRINIVIL,ZESTRIL) 10 MG tablet, TAKE 1 TABLET BY MOUTH EVERY DAY, Disp: 90 tablet, Rfl: 0 .  loratadine (CLARITIN) 10 MG tablet, Take 10 mg  by mouth daily., Disp: , Rfl:  .  meloxicam (MOBIC) 15 MG tablet, TAKE 1 TABLET EVERY DAY WITH FOOD AS NEEDED FOR MUSCULOSKELETAL PAIN, Disp: 30 tablet, Rfl: 6 .  Multiple Minerals-Vitamins (CALCIUM-MAGNESIUM-ZINC-D3 PO), Take by mouth. Pt takes 1 daily., Disp: , Rfl:  .  Multiple Vitamins-Minerals (MULTIVITAMIN ADULT) TABS, Take by mouth daily., Disp: , Rfl:  .  ondansetron (ZOFRAN) 4 MG tablet, Take 1 tablet (4 mg total) by mouth every 8 (eight) hours as needed for nausea or vomiting., Disp: 30 tablet, Rfl: 1 .  POTASSIUM PO, Take 1 tablet by mouth daily. , Disp: , Rfl:  .  PREVIDENT 5000 BOOSTER PLUS 1.1 % PSTE, BRUSH TEETH 2 TO 3 TIMES A DAY. AFTER USE SPIT OUT, BUT DO NOT RINSE., Disp: , Rfl: 6 .  Simethicone (GAS-X PO), Take 1 tablet by mouth daily as needed (gas)., Disp: , Rfl:  .  verapamil (CALAN-SR) 180 MG CR tablet, TAKE 1 TABLET BY MOUTH EVERYDAY AT BEDTIME, Disp: 90 tablet, Rfl: 1 .  vitamin B-12 (CYANOCOBALAMIN) 1000 MCG tablet, Take 2,000 mcg by mouth daily., Disp: , Rfl:  .  calcium carbonate (OSCAL) 1500 (600 Ca) MG TABS tablet, Take 600 mg of elemental calcium by mouth daily with breakfast., Disp: , Rfl:  .   CIPRODEX OTIC suspension, Place 4-5 drops into the right ear 2 (two) times daily. , Disp: , Rfl: 0 .  CRANBERRY EXTRACT PO, Take 1 tablet by mouth daily. , Disp: , Rfl:  .  estradiol (ESTRACE VAGINAL) 0.1 MG/GM vaginal cream, Apply 1/4 applicator 3 times weekly vaginally. (Patient not taking: Reported on 11/02/2018), Disp: 42.5 g, Rfl: 2 .  famotidine (PEPCID) 40 MG tablet, Take 1 tablet (40 mg total) by mouth 2 (two) times daily. (Patient not taking: Reported on 11/02/2018), Disp: 60 tablet, Rfl: 0 .  fluticasone (FLONASE) 50 MCG/ACT nasal spray, Place 2 sprays into both nostrils at bedtime. (Patient not taking: Reported on 11/02/2018), Disp: 48 g, Rfl: 3 .  ofloxacin (FLOXIN) 0.3 % OTIC solution, Place 5 drops into the right ear 2 (two) times daily. For up to 7 days for ear pain or drainage., Disp: , Rfl: 0 .  Red Yeast Rice 600 MG CAPS, Take 600 mg by mouth daily. , Disp: , Rfl:   EXAM:  VITALS per patient if applicable: BP 509/32 (BP Location: Left Arm, Patient Position: Sitting, Cuff Size: Normal)   Pulse 64   Wt 130 lb (59 kg)   LMP 06/27/1972   BMI 25.39 kg/m   Gen: alert, oriented, lucid thought and speech. No further exam b/c telephone encounter.  LABS: none today  Lab Results  Component Value Date   TSH 0.790 08/31/2017   Lab Results  Component Value Date   WBC 5.4 01/24/2018   HGB 13.5 01/24/2018   HCT 38.5 01/24/2018   MCV 86.5 01/24/2018   PLT 189 01/24/2018   Lab Results  Component Value Date   CREATININE 0.83 03/22/2018   BUN 13 03/22/2018   NA 133 (L) 03/22/2018   K 4.5 03/22/2018   CL 98 03/22/2018   CO2 27 03/22/2018   Lab Results  Component Value Date   ALT 21 01/24/2018   AST 29 01/24/2018   ALKPHOS 67 01/24/2018   BILITOT 1.1 01/24/2018   Lab Results  Component Value Date   CHOL 165 03/22/2018   Lab Results  Component Value Date   HDL 60.90 03/22/2018   Lab Results  Component Value Date  [place] 89 03/22/2018   Lab Results  Component  Value Date   TRIG 75.0 03/22/2018   Lab Results  Component Value Date   CHOLHDL 3 03/22/2018   Lab Results  Component Value Date   HGBA1C 4.9 01/30/2017    ASSESSMENT AND PLAN:  Discussed the following assessment and plan:  1) Chronic pain syndrome: The current medical regimen is effective;  continue present plan and medications. I did electronic rx's for Vicodin 5/325, 1 tid prn, #90 rx done today (just one).   CSC and UDS UTD at this time.  Will need to renew these at next office f/u in 3 mo.  2) HTN: The current medical regimen is effective;  continue present plan and medications. No labs needed at this time.  3) GAD: The current medical regimen is effective;  continue present plan and medications.   I discussed the assessment and treatment plan with the patient. The patient was provided an opportunity to ask questions and all were answered. The patient agreed with the plan and demonstrated an understanding of the instructions.   The patient was advised to call back or seek an in-person evaluation if the symptoms worsen or if the condition fails to improve as anticipated.  Spent 15 min with pt today, with >50% of this time spent in counseling and care coordination regarding the above problems.  F/u: 3-4 mo  Signed:  Crissie Sickles, MD           11/02/2018

## 2018-11-02 NOTE — Telephone Encounter (Signed)
PA sent via covermymed on 11/02/18   Key: [id number]   Medication: Hydro/Apap   Dx: Chronic low back pain, M54.5   Per Dr. Anitra Lauth pt has tried and failed none   Waiting for response.

## 2018-11-02 NOTE — Telephone Encounter (Signed)
PA has been approved as of 11/02/18.

## 2018-11-13 ENCOUNTER — Other Ambulatory Visit: Payer: Self-pay | Admitting: Family Medicine

## 2018-11-15 DIAGNOSIS — H7291 Unspecified perforation of tympanic membrane, right ear: Secondary | ICD-10-CM | POA: Diagnosis not present

## 2018-11-15 DIAGNOSIS — H6061 Unspecified chronic otitis externa, right ear: Secondary | ICD-10-CM | POA: Diagnosis not present

## 2018-11-15 DIAGNOSIS — B369 Superficial mycosis, unspecified: Secondary | ICD-10-CM | POA: Diagnosis not present

## 2018-11-15 DIAGNOSIS — B49 Unspecified mycosis: Secondary | ICD-10-CM | POA: Diagnosis not present

## 2018-11-15 DIAGNOSIS — H6241 Otitis externa in other diseases classified elsewhere, right ear: Secondary | ICD-10-CM | POA: Diagnosis not present

## 2018-11-26 ENCOUNTER — Other Ambulatory Visit: Payer: Self-pay | Admitting: Family Medicine

## 2018-11-29 DIAGNOSIS — B369 Superficial mycosis, unspecified: Secondary | ICD-10-CM | POA: Diagnosis not present

## 2018-11-29 DIAGNOSIS — B488 Other specified mycoses: Secondary | ICD-10-CM | POA: Diagnosis not present

## 2018-11-29 DIAGNOSIS — H6061 Unspecified chronic otitis externa, right ear: Secondary | ICD-10-CM | POA: Diagnosis not present

## 2018-11-29 DIAGNOSIS — H6241 Otitis externa in other diseases classified elsewhere, right ear: Secondary | ICD-10-CM | POA: Diagnosis not present

## 2018-11-29 DIAGNOSIS — H7291 Unspecified perforation of tympanic membrane, right ear: Secondary | ICD-10-CM | POA: Diagnosis not present

## 2018-12-05 DIAGNOSIS — I447 Left bundle-branch block, unspecified: Secondary | ICD-10-CM | POA: Diagnosis not present

## 2018-12-05 DIAGNOSIS — H7291 Unspecified perforation of tympanic membrane, right ear: Secondary | ICD-10-CM | POA: Diagnosis not present

## 2018-12-05 DIAGNOSIS — Z01818 Encounter for other preprocedural examination: Secondary | ICD-10-CM | POA: Diagnosis not present

## 2018-12-05 DIAGNOSIS — I10 Essential (primary) hypertension: Secondary | ICD-10-CM | POA: Diagnosis not present

## 2018-12-05 DIAGNOSIS — Z1159 Encounter for screening for other viral diseases: Secondary | ICD-10-CM | POA: Diagnosis not present

## 2018-12-05 DIAGNOSIS — Z01812 Encounter for preprocedural laboratory examination: Secondary | ICD-10-CM | POA: Diagnosis not present

## 2018-12-12 DIAGNOSIS — H7291 Unspecified perforation of tympanic membrane, right ear: Secondary | ICD-10-CM | POA: Diagnosis not present

## 2018-12-12 DIAGNOSIS — H9211 Otorrhea, right ear: Secondary | ICD-10-CM | POA: Diagnosis not present

## 2018-12-12 DIAGNOSIS — Z9889 Other specified postprocedural states: Secondary | ICD-10-CM | POA: Diagnosis not present

## 2018-12-12 DIAGNOSIS — M868X8 Other osteomyelitis, other site: Secondary | ICD-10-CM | POA: Diagnosis not present

## 2018-12-12 HISTORY — PX: OTHER SURGICAL HISTORY: SHX169

## 2018-12-20 ENCOUNTER — Other Ambulatory Visit: Payer: Self-pay | Admitting: Family Medicine

## 2018-12-20 DIAGNOSIS — Z48810 Encounter for surgical aftercare following surgery on the sense organs: Secondary | ICD-10-CM | POA: Diagnosis not present

## 2018-12-25 ENCOUNTER — Encounter: Payer: Self-pay | Admitting: Family Medicine

## 2019-01-03 ENCOUNTER — Telehealth: Payer: Self-pay | Admitting: Family Medicine

## 2019-01-03 NOTE — Telephone Encounter (Signed)
Please advise, thanks.

## 2019-01-03 NOTE — Telephone Encounter (Signed)
I definitely encourage him to bring his children to visit with him.

## 2019-01-03 NOTE — Telephone Encounter (Signed)
Patient's son wants to come home to visit patient & her husband, Juanda Crumble. Patient's son has 4 children ranging from 13-74 years old. Patient's son would like to bring his children too but will visit alone if advised. Patient is asking guidance on how to safely visit with her children.

## 2019-01-04 NOTE — Telephone Encounter (Signed)
Patient advised and voiced understanding.  

## 2019-01-14 ENCOUNTER — Other Ambulatory Visit: Payer: Self-pay | Admitting: Family Medicine

## 2019-01-23 ENCOUNTER — Other Ambulatory Visit: Payer: Self-pay

## 2019-01-23 MED ORDER — HYDROCODONE-ACETAMINOPHEN 5-325 MG PO TABS: ORAL_TABLET | ORAL | 0 refills | Status: DC

## 2019-01-23 NOTE — Telephone Encounter (Signed)
RF request for Hydrocodone LOV: 11/02/18 Next ov: advised to f/u 3-4 mo. Last written: 11/02/18 (90,0) TID Last CSC and UDS:12/19/17   Medication pending. Please advise, thanks.

## 2019-01-23 NOTE — Telephone Encounter (Signed)
OK, will do rx for her vicodin. Will need to update her CSC at next f/u visit.

## 2019-01-23 NOTE — Telephone Encounter (Signed)
Pt was notified that med was sent.

## 2019-02-07 ENCOUNTER — Other Ambulatory Visit: Payer: Self-pay | Admitting: Family Medicine

## 2019-02-07 DIAGNOSIS — B369 Superficial mycosis, unspecified: Secondary | ICD-10-CM | POA: Diagnosis not present

## 2019-02-07 DIAGNOSIS — Z48810 Encounter for surgical aftercare following surgery on the sense organs: Secondary | ICD-10-CM | POA: Diagnosis not present

## 2019-02-08 ENCOUNTER — Other Ambulatory Visit: Payer: Self-pay | Admitting: Family Medicine

## 2019-02-16 ENCOUNTER — Other Ambulatory Visit: Payer: Self-pay | Admitting: Family Medicine

## 2019-02-21 DIAGNOSIS — H938X1 Other specified disorders of right ear: Secondary | ICD-10-CM | POA: Diagnosis not present

## 2019-02-21 DIAGNOSIS — Z48810 Encounter for surgical aftercare following surgery on the sense organs: Secondary | ICD-10-CM | POA: Diagnosis not present

## 2019-03-06 ENCOUNTER — Other Ambulatory Visit: Payer: Self-pay

## 2019-03-06 ENCOUNTER — Encounter: Payer: Self-pay | Admitting: Family Medicine

## 2019-03-06 ENCOUNTER — Ambulatory Visit (INDEPENDENT_AMBULATORY_CARE_PROVIDER_SITE_OTHER): Payer: Medicare Other | Admitting: Family Medicine

## 2019-03-06 VITALS — BP 132/69 | HR 66 | Wt 128.0 lb

## 2019-03-06 DIAGNOSIS — R6889 Other general symptoms and signs: Secondary | ICD-10-CM

## 2019-03-06 DIAGNOSIS — F411 Generalized anxiety disorder: Secondary | ICD-10-CM

## 2019-03-06 DIAGNOSIS — G894 Chronic pain syndrome: Secondary | ICD-10-CM | POA: Diagnosis not present

## 2019-03-06 DIAGNOSIS — Z79899 Other long term (current) drug therapy: Secondary | ICD-10-CM | POA: Diagnosis not present

## 2019-03-06 DIAGNOSIS — I10 Essential (primary) hypertension: Secondary | ICD-10-CM | POA: Diagnosis not present

## 2019-03-06 NOTE — Progress Notes (Addendum)
Virtual Visit via Video Note  I connected with pt on 03/06/19 at  4:00 PM EDT by telephone b/c pt did not have access to a video enabled telemedicine application) and verified that I am speaking with the correct person using two identifiers.  Location patient: home Location provider:work or home office Persons participating in the virtual visit: patient, provider  I discussed the limitations of evaluation and management by telemedicine and the availability of in person appointments. The patient expressed understanding and agreed to proceed.   HPI: Patient is a 73 y.o. Caucasian female who presents for 4 mo f/u HTN, chronic pain syndrome, and GAD with recent adjustment d/o with anx/dep mood.   Indication for chronic opioid:chronic low back and neck pain from DDD, not a candidate for further surgical intervention, failed ESI. Medication and dose:vicodin 5/325, 1 tid prn # pills per month: 90--this lasts approx 2-3 mo Last UDS date:12/19/17 Opioid Treatment Agreement signed (Y/N):12/19/17 Opioid Treatment Agreement last reviewed with patient:today [facility] reviewed this encounter (include red flags):today, no red flags. Vicodin last RF'd 01/23/19, #90, rx by me.  PMP AWARE reviewed and no red flags noted.  Neck, shoulders, arms pain chronically.  Taking meloxicam daily And 2-3 vicodin tabs per day.  No adverse side effects.   She got R TM reconstruction between last visit and now, f/u has shown that things are healing.  GAD with recent adjustment d/o with mixed anx/dep mood: alprazolam 0.5mg , 1 tab tid prn. Most recent rx fill for this med was 01/05/19, #90, rx by me, has 2 RF's remaining. She has been resistant to my recommendation of a trial of antidepressant.  HTN: home bp monitoring at home consistently normal.  ROS: no CP, no SOB, no wheezing, no cough, no dizziness, no HAs, no rashes, no melena/hematochezia.  No polyuria or polydipsia.  No myalgias or arthralgias. +COLD  INTOLERANCE-unclear whether acute or chronic but it sounds more chronic  ROS: See pertinent positives and negatives per HPI.  Past Medical History:  Diagnosis Date  . Allergic rhinitis    maple pollen  . Anxiety   . Arthritis   . Bowel obstruction ([place])   . Chronic low back pain    Dr. Trenton Gammon.  Has had back injection--bp elevated after.  . Chronic pain syndrome   . DDD (degenerative disc disease), cervical    Hx of ACDF (Dr. [name]).  Followed by Dr. [name].  Also, Dr. [name] do left C7-T1 intralaminal epidural injection.  . Diverticulosis of colon   . DJD (degenerative joint disease)   . Gallstones   . GERD (gastroesophageal reflux disease)   . Herpes zoster 07/08/2014  . Hypercholesteremia    mild; pt declined statin trial 05/2014--needs recheck lipid panel at first f/u visit in 2017  . Hypertension   . Osteopenia 06/2017   T score -1.4 FRAX 15% / 2%  . Ovarian cyst 2017   82019-robotic assisted bilatel SPO: all path benign.  . Perianal dermatitis    prn cutivate  . Phlebitis   . Right knee injury 2018   Patellofemoral crush injury--Dr. [name].  . Trochanteric bursitis of right hip    Recurrent (injection by Dr. [name] 05/26/15)  . Tympanic membrane rupture, right 12/01/2016   As of 08/2018 pt set for tympanomastoidectomy and STSG (Dr. [name]). Recurrent fungal/bact OE.  . Varicose vein    left leg     Past Surgical History:  Procedure Laterality Date  . ABDOMINAL HYSTERECTOMY  03/2015   TAH  . ANTERIOR  AND POSTERIOR REPAIR N/A 03/18/2014   Procedure: Cystocele repair with graft, Vault suspension, Rectocele repair;  Surgeon: [name], MD;  Location: [facility];  Service: Urology;  Laterality: N/A;  . CHOLECYSTECTOMY    . CHOLECYSTECTOMY, LAPAROSCOPIC    . COLON SURGERY    . COLONOSCOPY  04/2006; 05/26/16   2007 (Dr. [name]): Normal.  04/2016 (Dr. [name]) normal except diverticulosis and decreased anal sphincter tone.  No repeat  colonoscopy is recommended due to age.  . cspine surgery     Dr. Wiliam Ke level ant cerv discectomy /fusion w/plating  . CYSTO N/A 03/18/2014   Procedure: CYSTO;  Surgeon: [name], MD;  Location: [facility];  Service: Urology;  Laterality: N/A;  . DEXA  07/30/2015; 06/2018   2017 and 2020 -->Osteopenia--repeat 2 yrs.  Marland Kitchen LYSIS OF ADHESION N/A 01/31/2018   Procedure: POSSIBLE LYSIS OF ADHESION;  Surgeon: Everitt Amber, MD;  Location: [facility];  Service: Gynecology;  Laterality: N/A;  . [place]     with prolasped bledder repair  . RESECTION OF COLON     BENIGN TUMOR  . RIGHT EAR SURGERY  08/23/2017   Dr. 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 right ear surgery  12/12/2018   Right tympanomastoidectomy, ossiculoplasty with partial prosthesis, split thickness skin graft from postauricular skin 1x1cm, and right tragal cartilage graft 1x1 cm [facility])  . right hemicolectomy for diverticulitis with abscess  1993  . ROBOTIC ASSISTED BILATERAL SALPINGO OOPHERECTOMY Bilateral 01/31/2018   All PATH benign.  Procedure: XI ROBOTIC ASSISTED BILATERAL SALPINGO OOPHORECTOMY;  Surgeon: Everitt Amber, MD;  Location: [facility];  Service: Gynecology;  Laterality: Bilateral;  . TUBAL LIGATION      Family History  Problem Relation Age of Onset  . Hypertension Mother   . Diverticulitis Mother   . Breast cancer Sister 24  . Melanoma Brother   . Prostate cancer Brother   . Leukemia Brother   . Lupus Sister   . Lung disease Brother   . Stomach cancer Father   . Other Other        Family member with MGUS     Current Outpatient Medications:  .  ALPRAZolam (XANAX) 0.5 MG tablet, TAKE 1 TABLET BY MOUTH 3 TIMES DAILY AS NEEDED FOR ANXIETY, Disp: 90 tablet, Rfl: 5 .  aspirin EC 81 MG tablet, Take 81 mg by mouth daily., Disp: , Rfl:  .  Biotin 10000 MCG TABS, Take 10,000  mcg by mouth daily. , Disp: , Rfl:  .  calcium carbonate (OSCAL) 1500 (600 Ca) MG TABS tablet, Take 600 mg of elemental calcium by mouth daily with breakfast., Disp: , Rfl:  .  Carboxymethylcellul-Glycerin (LUBRICATING EYE DROPS OP), Place 1 drop into both eyes daily as needed (dry eyes)., Disp: , Rfl:  .  Cholecalciferol (VITAMIN D3) 5000 units CAPS, Take 5,000 Units by mouth daily., Disp: , Rfl:  .  CRANBERRY EXTRACT PO, Take 1 tablet by mouth daily. , Disp: , Rfl:  .  famotidine (PEPCID) 40 MG tablet, TAKE 1 TABLET BY MOUTH TWICE A DAY, Disp: 180 tablet, Rfl: 1 .  fluticasone (CUTIVATE) 0.05 % cream, Apply to affected area bid prn (Patient taking differently: Apply 1 application topically 2 (two) times daily as needed (irritation). ), Disp: 30 g, Rfl: 1 .  HYDROcodone-acetaminophen (NORCO/VICODIN) 5-325 MG tablet, 1 tab po tid prn  pain, Disp: 90 tablet, Rfl: 0 .  Lactobacillus-Inulin (CULTURELLE DIGESTIVE HEALTH PO), Take 1 tablet by mouth daily as needed (upset stomach). , Disp: , Rfl:  .  lisinopril (ZESTRIL) 10 MG tablet, TAKE 1 TABLET BY MOUTH EVERY DAY, Disp: 90 tablet, Rfl: 1 .  loratadine (CLARITIN) 10 MG tablet, Take 10 mg by mouth daily., Disp: , Rfl:  .  meloxicam (MOBIC) 15 MG tablet, TAKE 1 TABLET EVERY DAY WITH FOOD AS NEEDED FOR MUSCULOSKELETAL PAIN, Disp: 30 tablet, Rfl: 6 .  Multiple Minerals-Vitamins (CALCIUM-MAGNESIUM-ZINC-D3 PO), Take by mouth. Pt takes 1 daily., Disp: , Rfl:  .  Multiple Vitamins-Minerals (MULTIVITAMIN ADULT) TABS, Take by mouth daily., Disp: , Rfl:  .  ondansetron (ZOFRAN) 4 MG tablet, Take 1 tablet (4 mg total) by mouth every 8 (eight) hours as needed for nausea or vomiting., Disp: 30 tablet, Rfl: 1 .  POTASSIUM PO, Take 1 tablet by mouth daily. , Disp: , Rfl:  .  PREVIDENT 5000 BOOSTER PLUS 1.1 % PSTE, BRUSH TEETH 2 TO 3 TIMES A DAY. AFTER USE SPIT OUT, BUT DO NOT RINSE., Disp: , Rfl: 6 .  Red Yeast Rice 600 MG CAPS, Take 600 mg by mouth daily. , Disp: ,  Rfl:  .  Simethicone (GAS-X PO), Take 1 tablet by mouth daily as needed (gas)., Disp: , Rfl:  .  verapamil (CALAN-SR) 180 MG CR tablet, TAKE 1 TABLET BY MOUTH EVERYDAY AT BEDTIME, Disp: 90 tablet, Rfl: 1 .  vitamin B-12 (CYANOCOBALAMIN) 1000 MCG tablet, Take 2,000 mcg by mouth daily., Disp: , Rfl:  .  CIPRODEX OTIC suspension, Place 4-5 drops into the right ear 2 (two) times daily. , Disp: , Rfl: 0 .  estradiol (ESTRACE VAGINAL) 0.1 MG/GM vaginal cream, Apply 1/4 applicator 3 times weekly vaginally. (Patient not taking: Reported on 11/02/2018), Disp: 42.5 g, Rfl: 2 .  fluticasone (FLONASE) 50 MCG/ACT nasal spray, Place 2 sprays into both nostrils at bedtime. (Patient not taking: Reported on 11/02/2018), Disp: 48 g, Rfl: 3 .  ofloxacin (FLOXIN) 0.3 % OTIC solution, Place 5 drops into the right ear 2 (two) times daily. For up to 7 days for ear pain or drainage., Disp: , Rfl: 0  EXAM:  VITALS per patient if applicable: BP 123XX123 (BP Location: Left Arm, Patient Position: Sitting, Cuff Size: Normal)   Pulse 66   Wt 128 lb (58.1 kg)   LMP 06/27/1972   BMI 25.00 kg/m    GENERAL: alert, oriented, sounds well and in no acute distress   LABS: none today.  Lab Results  Component Value Date   TSH 0.790 08/31/2017   Lab Results  Component Value Date   WBC 5.4 01/24/2018   HGB 13.5 01/24/2018   HCT 38.5 01/24/2018   MCV 86.5 01/24/2018   PLT 189 01/24/2018   Lab Results  Component Value Date   CREATININE 0.83 03/22/2018   BUN 13 03/22/2018   NA 133 (L) 03/22/2018   K 4.5 03/22/2018   CL 98 03/22/2018   CO2 27 03/22/2018   Lab Results  Component Value Date   ALT 21 01/24/2018   AST 29 01/24/2018   ALKPHOS 67 01/24/2018   BILITOT 1.1 01/24/2018   Lab Results  Component Value Date   CHOL 165 03/22/2018   Lab Results  Component Value Date   HDL 60.90 03/22/2018   Lab Results  Component Value Date   LDLCALC 89 03/22/2018   Lab Results  Component Value Date   TRIG 75.0  03/22/2018   Lab Results  Component Value Date   CHOLHDL 3 03/22/2018    ASSESSMENT AND PLAN:  Discussed the following assessment and plan:  1) HTN: The current medical regimen is effective;  continue present plan and medications. Lytes/cr today.  2) GAD: stable.  Uses benzo responsibly and has been using this med long term. Need to have her update her CSC and give urine for UDS when she comes in for labs (ordered-future).  3) Chronic pain syndrome, high risk med use: The current medical regimen is effective;  continue present plan and medications. Needs CSC renewal and UDS-->see #3 above.  4) Cold intolerance: chronic more likely than acute. Difficult historian. Check TSH and CBC today.  -we discussed possible serious and likely etiologies, options for evaluation and workup, limitations of telemedicine visit vs in person visit, treatment, treatment risks and precautions. Pt prefers to treat via telemedicine empirically rather then risking or undertaking an in person visit at this moment. Patient agrees to seek prompt in person care if worsening, new symptoms arise, or if is not improving with treatment.   I discussed the assessment and treatment plan with the patient. The patient was provided an opportunity to ask questions and all were answered. The patient agreed with the plan and demonstrated an understanding of the instructions.   The patient was advised to call back or seek an in-person evaluation if the symptoms worsen or if the condition fails to improve as anticipated.  F/u: 3 mo  Signed:  Crissie Sickles, MD           03/06/2019  Spent 15 min with pt today, with >50% of this time spent in counseling and care coordination regarding the above problems.

## 2019-03-08 NOTE — Addendum Note (Signed)
Addended by: [name] on: 03/08/2019 09:08 AM   Modules accepted: Level of Service

## 2019-03-20 DIAGNOSIS — B369 Superficial mycosis, unspecified: Secondary | ICD-10-CM | POA: Diagnosis not present

## 2019-03-20 DIAGNOSIS — Z48810 Encounter for surgical aftercare following surgery on the sense organs: Secondary | ICD-10-CM | POA: Diagnosis not present

## 2019-03-20 DIAGNOSIS — Z9889 Other specified postprocedural states: Secondary | ICD-10-CM | POA: Diagnosis not present

## 2019-03-20 DIAGNOSIS — Z01118 Encounter for examination of ears and hearing with other abnormal findings: Secondary | ICD-10-CM | POA: Diagnosis not present

## 2019-03-20 DIAGNOSIS — H90A21 Sensorineural hearing loss, unilateral, right ear, with restricted hearing on the contralateral side: Secondary | ICD-10-CM | POA: Diagnosis not present

## 2019-03-20 DIAGNOSIS — H608X9 Other otitis externa, unspecified ear: Secondary | ICD-10-CM | POA: Diagnosis not present

## 2019-03-20 DIAGNOSIS — H6240 Otitis externa in other diseases classified elsewhere, unspecified ear: Secondary | ICD-10-CM | POA: Diagnosis not present

## 2019-03-27 ENCOUNTER — Telehealth: Payer: Self-pay | Admitting: Family Medicine

## 2019-03-27 NOTE — Telephone Encounter (Signed)
Patient advised.

## 2019-03-27 NOTE — Telephone Encounter (Signed)
Pt is using ciprodex and ofloxacin for ear. Is patient okay to get flu shot

## 2019-03-27 NOTE — Telephone Encounter (Signed)
Patient is asking if it is okay to get a flu shot while she is using liquid antibiotic for her ear. Rx was from [facility] Dr. Levonne Spiller.  Okay to leave detailed message on home ans mach.

## 2019-03-27 NOTE — Telephone Encounter (Signed)
Yes, definitely ok to get flu shot.

## 2019-03-28 ENCOUNTER — Ambulatory Visit: Payer: Medicare Other

## 2019-04-03 ENCOUNTER — Ambulatory Visit (INDEPENDENT_AMBULATORY_CARE_PROVIDER_SITE_OTHER): Payer: Medicare Other

## 2019-04-03 ENCOUNTER — Other Ambulatory Visit: Payer: Self-pay

## 2019-04-03 DIAGNOSIS — F411 Generalized anxiety disorder: Secondary | ICD-10-CM | POA: Diagnosis not present

## 2019-04-03 DIAGNOSIS — R6889 Other general symptoms and signs: Secondary | ICD-10-CM | POA: Diagnosis not present

## 2019-04-03 DIAGNOSIS — G894 Chronic pain syndrome: Secondary | ICD-10-CM | POA: Diagnosis not present

## 2019-04-03 DIAGNOSIS — Z79899 Other long term (current) drug therapy: Secondary | ICD-10-CM | POA: Diagnosis not present

## 2019-04-03 DIAGNOSIS — I10 Essential (primary) hypertension: Secondary | ICD-10-CM

## 2019-04-03 DIAGNOSIS — Z23 Encounter for immunization: Secondary | ICD-10-CM | POA: Diagnosis not present

## 2019-04-03 LAB — CBC WITH DIFFERENTIAL/PLATELET
Basophils Absolute: 0 10*3/uL (ref 0.0–0.1)
Basophils Relative: 0.6 % (ref 0.0–3.0)
Eosinophils Absolute: 0.1 10*3/uL (ref 0.0–0.7)
Eosinophils Relative: 2.2 % (ref 0.0–5.0)
HCT: 39.5 % (ref 36.0–46.0)
Hemoglobin: 13.2 g/dL (ref 12.0–15.0)
Lymphocytes Relative: 49.2 % — ABNORMAL HIGH (ref 12.0–46.0)
Lymphs Abs: 3.1 10*3/uL (ref 0.7–4.0)
MCHC: 33.5 g/dL (ref 30.0–36.0)
MCV: 90.3 fl (ref 78.0–100.0)
Monocytes Absolute: 0.5 10*3/uL (ref 0.1–1.0)
Monocytes Relative: 8.3 % (ref 3.0–12.0)
Neutro Abs: 2.5 10*3/uL (ref 1.4–7.7)
Neutrophils Relative %: 39.7 % — ABNORMAL LOW (ref 43.0–77.0)
Platelets: 176 10*3/uL (ref 150.0–400.0)
RBC: 4.37 Mil/uL (ref 3.87–5.11)
RDW: 13.2 % (ref 11.5–15.5)
WBC: 6.4 10*3/uL (ref 4.0–10.5)

## 2019-04-03 LAB — BASIC METABOLIC PANEL
BUN: 16 mg/dL (ref 6–23)
CO2: 27 mEq/L (ref 19–32)
Calcium: 9.6 mg/dL (ref 8.4–10.5)
Chloride: 99 mEq/L (ref 96–112)
Creatinine, Ser: 0.86 mg/dL (ref 0.40–1.20)
GFR: 64.41 mL/min (ref >60.00)
Glucose, Bld: 93 mg/dL (ref 70–99)
Potassium: 4.4 mEq/L (ref 3.5–5.1)
Sodium: 134 mEq/L — ABNORMAL LOW (ref 135–145)

## 2019-04-03 LAB — COMPREHENSIVE METABOLIC PANEL
ALT: 17 U/L (ref 0–35)
AST: 22 U/L (ref 0–37)
Albumin: 4.2 g/dL (ref 3.5–5.2)
Alkaline Phosphatase: 75 U/L (ref 39–117)
BUN: 16 mg/dL (ref 6–23)
CO2: 27 mEq/L (ref 19–32)
Calcium: 9.6 mg/dL (ref 8.4–10.5)
Chloride: 99 mEq/L (ref 96–112)
Creatinine, Ser: 0.86 mg/dL (ref 0.40–1.20)
GFR: 64.41 mL/min (ref >60.00)
Glucose, Bld: 93 mg/dL (ref 70–99)
Potassium: 4.4 mEq/L (ref 3.5–5.1)
Sodium: 134 mEq/L — ABNORMAL LOW (ref 135–145)
Total Bilirubin: 0.6 mg/dL (ref 0.2–1.2)
Total Protein: 6.8 g/dL (ref 6.0–8.3)

## 2019-04-03 LAB — TSH: TSH: 1.69 u[IU]/mL (ref 0.35–4.50)

## 2019-04-03 NOTE — Progress Notes (Signed)
Patient came in today and singed her CSC while getting labs and her flu shot. Just a [place]

## 2019-04-04 ENCOUNTER — Other Ambulatory Visit: Payer: Self-pay | Admitting: Family Medicine

## 2019-04-04 ENCOUNTER — Encounter: Payer: Self-pay | Admitting: Gynecology

## 2019-04-04 DIAGNOSIS — Z9889 Other specified postprocedural states: Secondary | ICD-10-CM | POA: Diagnosis not present

## 2019-04-04 DIAGNOSIS — B369 Superficial mycosis, unspecified: Secondary | ICD-10-CM | POA: Diagnosis not present

## 2019-04-04 DIAGNOSIS — H6240 Otitis externa in other diseases classified elsewhere, unspecified ear: Secondary | ICD-10-CM | POA: Diagnosis not present

## 2019-04-04 DIAGNOSIS — H608X1 Other otitis externa, right ear: Secondary | ICD-10-CM | POA: Diagnosis not present

## 2019-04-05 ENCOUNTER — Telehealth: Payer: Self-pay | Admitting: Family Medicine

## 2019-04-05 ENCOUNTER — Encounter: Payer: Self-pay | Admitting: Family Medicine

## 2019-04-05 LAB — PAIN MGMT, PROFILE 8 W/CONF, U
6 Acetylmorphine: NEGATIVE ng/mL
Alcohol Metabolites: NEGATIVE ng/mL (ref <500)
Alphahydroxyalprazolam: 79 ng/mL
Alphahydroxymidazolam: NEGATIVE ng/mL
Alphahydroxytriazolam: NEGATIVE ng/mL
Aminoclonazepam: NEGATIVE ng/mL
Amphetamines: NEGATIVE ng/mL
Benzodiazepines: POSITIVE ng/mL
Buprenorphine, Urine: NEGATIVE ng/mL
Cocaine Metabolite: NEGATIVE ng/mL
Codeine: NEGATIVE ng/mL
Creatinine: 46.6 mg/dL
Hydrocodone: 368 ng/mL
Hydromorphone: 87 ng/mL
Hydroxyethylflurazepam: NEGATIVE ng/mL
Lorazepam: NEGATIVE ng/mL
MDMA: NEGATIVE ng/mL
Marijuana Metabolite: NEGATIVE ng/mL
Morphine: NEGATIVE ng/mL
Nordiazepam: NEGATIVE ng/mL
Norhydrocodone: 285 ng/mL
Opiates: POSITIVE ng/mL
Oxazepam: NEGATIVE ng/mL
Oxidant: NEGATIVE ug/mL
Oxycodone: NEGATIVE ng/mL
Temazepam: NEGATIVE ng/mL
pH: 5.4 (ref 4.5–9.0)

## 2019-04-05 NOTE — Telephone Encounter (Signed)
Requesting: Alprazolam Contract:signed 04/03/19 UDS: 04/03/19 Last Visit:03/06/19 Next Visit:05/30/19 Last Refill:07/31/18 (90,5)  Please Advise

## 2019-04-05 NOTE — Telephone Encounter (Signed)
Patient is requesting the most recent labwork results to be mailed to her.

## 2019-04-05 NOTE — Telephone Encounter (Signed)
A copy has been made and placed up front to be mailed.

## 2019-04-08 ENCOUNTER — Ambulatory Visit (INDEPENDENT_AMBULATORY_CARE_PROVIDER_SITE_OTHER): Payer: Medicare Other | Admitting: Gynecology

## 2019-04-08 ENCOUNTER — Encounter: Payer: Self-pay | Admitting: Gynecology

## 2019-04-08 ENCOUNTER — Other Ambulatory Visit: Payer: Self-pay

## 2019-04-08 VITALS — BP 124/82

## 2019-04-08 DIAGNOSIS — N951 Menopausal and female climacteric states: Secondary | ICD-10-CM | POA: Diagnosis not present

## 2019-04-08 DIAGNOSIS — R300 Dysuria: Secondary | ICD-10-CM | POA: Diagnosis not present

## 2019-04-08 MED ORDER — NITROFURANTOIN MONOHYD MACRO 100 MG PO CAPS: 100.0000 mg | ORAL_CAPSULE | Freq: Two times a day (BID) | ORAL | 0 refills | Status: DC

## 2019-04-08 NOTE — Progress Notes (Signed)
    Alison Cuevas [birth year] [id number]        73 y.o.  G4P0013 presents with several days of dysuria frequency and urgency.  No fever or chills.  No low back pain.  Some right lower quadrant discomfort.  No diarrhea or constipation.  No vaginal discharge or irritation.  Past medical history,surgical history, problem list, medications, allergies, family history and social history were all reviewed and documented in the EPIC chart.  Directed ROS with pertinent positives and negatives documented in the history of present illness/assessment and plan.  Exam: Caryn Bee assistant Vitals:   04/08/19 1209  BP: 124/82   General appearance:  Normal Spine straight without CVA tenderness Abdomen soft nontender without mass guarding rebound Pelvic external BUS vagina with atrophic changes.  Bimanual without masses or tenderness.  Assessment/Plan:  73 y.o. [id number] with history as above.  Urine analysis consistent with UTI.  Will cover with Macrodantin 100 mg twice daily x7 days.  Follow-up if symptoms persist, worsen or recur.  Patient asked about over-the-counter products to help with hot flushes.  Recently had thyroid checked at her primary provider's office which was normal.  Recommended trial of Estroven.    [name] MD, 12:49 PM 04/08/2019

## 2019-04-08 NOTE — Patient Instructions (Signed)
Take the Macrodantin antibiotic twice daily for 7 days to treat the urinary tract infection.  Try over-the-counter Estroven for the menopausal symptoms.

## 2019-04-09 ENCOUNTER — Telehealth: Payer: Self-pay | Admitting: *Deleted

## 2019-04-09 NOTE — Telephone Encounter (Signed)
Patient called c/o diarrhea, was prescribed Macrodantin 100 mg twice daily x7 days on OV 04/08/19. Patient said now she is c/o diarrhea, asked if you would be wiling to send Rx for her to help with this?  Patient did mention that antibiotics sometimes give her diarrhea. Please advise

## 2019-04-09 NOTE — Telephone Encounter (Signed)
I would use an over-the-counter product such as Imodium

## 2019-04-09 NOTE — Telephone Encounter (Signed)
Patient informed. 

## 2019-04-11 DIAGNOSIS — B369 Superficial mycosis, unspecified: Secondary | ICD-10-CM | POA: Diagnosis not present

## 2019-04-11 DIAGNOSIS — Z9889 Other specified postprocedural states: Secondary | ICD-10-CM | POA: Diagnosis not present

## 2019-04-11 DIAGNOSIS — H903 Sensorineural hearing loss, bilateral: Secondary | ICD-10-CM | POA: Diagnosis not present

## 2019-04-11 DIAGNOSIS — H6240 Otitis externa in other diseases classified elsewhere, unspecified ear: Secondary | ICD-10-CM | POA: Diagnosis not present

## 2019-04-11 LAB — URINALYSIS, COMPLETE W/RFL CULTURE
Bilirubin Urine: NEGATIVE
Glucose, UA: NEGATIVE
Hgb urine dipstick: NEGATIVE
Hyaline Cast: NONE SEEN /LPF
Ketones, ur: NEGATIVE
Nitrites, Initial: NEGATIVE
Protein, ur: NEGATIVE
Specific Gravity, Urine: 1.015 (ref 1.001–1.03)
pH: 6 (ref 5.0–8.0)

## 2019-04-11 LAB — URINE CULTURE
MICRO NUMBER:: 979044
SPECIMEN QUALITY:: ADEQUATE

## 2019-04-11 LAB — CULTURE INDICATED

## 2019-04-16 ENCOUNTER — Telehealth: Payer: Self-pay

## 2019-04-16 MED ORDER — FLUCONAZOLE 150 MG PO TABS: ORAL_TABLET | ORAL | 0 refills | Status: DC

## 2019-04-16 NOTE — Telephone Encounter (Signed)
Saw Dr. Loetta Rough last week and was prescribed Macrobid. She had also been on antibiotic for her ear. She called complaining of thrush in her mouth. Mouth is sore inside and burns. Tongue with white coating.  I asked about any vaginal sx and she said she was having a little burning. When she wears panties it feels irritated.

## 2019-04-16 NOTE — Telephone Encounter (Signed)
Recommend Diflucan 150 mg daily x5 days.  If the patient is on cholesterol medicine then asked her to stop it during this time.

## 2019-04-19 ENCOUNTER — Telehealth: Payer: Self-pay | Admitting: *Deleted

## 2019-04-19 NOTE — Telephone Encounter (Signed)
Patient called to follow up from telephone encounter on 04/16/19 report has taken 2 out of the 5 tablet of diflucan 150 mg this am she woke up dizzy, and feels nauseated, so she sat back down before attempting to walk.  no vomiting, no other symptoms, normal blood pressure reading. Did not take diflucan this am, reports her mouth is feeling, slight amount white coating tongue still there, but much better, no more sore gums or burning sensation. Patient thought maybe she will skip pill for today and monitor how she feels. Please advise

## 2019-04-19 NOTE — Telephone Encounter (Signed)
I think that is a good idea.  Taking 2 pills may be enough.  It may take several days for it to completely get better.  If it does continue we can switch her to a swish and swallow nystatin type mixture.

## 2019-04-19 NOTE — Telephone Encounter (Signed)
Patient informed. 

## 2019-04-22 ENCOUNTER — Telehealth: Payer: Self-pay | Admitting: *Deleted

## 2019-04-22 MED ORDER — SULFAMETHOXAZOLE-TRIMETHOPRIM 800-160 MG PO TABS: 1.0000 | ORAL_TABLET | Freq: Two times a day (BID) | ORAL | 0 refills | Status: DC

## 2019-04-22 NOTE — Telephone Encounter (Signed)
Patient called c/o vaginal burning and burning at the start of urination, pressure and urgency, no discharge, no itching, no odor, no fever, no chills. Patient was treated with Macrobid 100 mg bid x 7 days on OV 04/08/19, patient said symptoms started on 04/20/19. She is drinking plenty of fluids, reports last night running to the bathroom at least every 30 minutes. Patient asked if you would be willing to send another Rx?

## 2019-04-22 NOTE — Telephone Encounter (Signed)
Septra DS 1 p.o. twice daily x7 days

## 2019-04-22 NOTE — Telephone Encounter (Signed)
Patient informed, Rx sent.  

## 2019-04-24 ENCOUNTER — Telehealth: Payer: Self-pay | Admitting: *Deleted

## 2019-04-24 MED ORDER — TERCONAZOLE 0.4 % VA CREA: 1.0000 | TOPICAL_CREAM | Freq: Every day | VAGINAL | 0 refills | Status: DC

## 2019-04-24 NOTE — Telephone Encounter (Signed)
Patient informed, Rx sent.  

## 2019-04-24 NOTE — Telephone Encounter (Signed)
Patient called to follow up from telephone encounter on 04/22/19 currently taking Septra DS 1 p.o. twice daily x7 days, on day 3 of medication and still having burning. No fever, no chills, no back pain, no vaginal odor, no discharge, no urgency, no frequency. Patient said the vaginal burning is all the time, with urination and without urination she called the pharmacy and they told her to try Azo, she took 1 pill of Azo, drinking plenty of fluids and cranberry juice, she has estrace cream that she rubbed externally, but said the burning is internal and external and feels like a "fire torch" is blowing on her. Patient said her lower right side is hurting as well, I did offer another office visit, however she asked me to check with you to see you thoughts.  Patient asked if you have any other recommendations?

## 2019-04-24 NOTE — Telephone Encounter (Signed)
Possibly could be yeast.  Would recommend terconazole 7-day cream internal/external x7 days.  Office visit if symptoms continue.

## 2019-04-27 ENCOUNTER — Emergency Department (HOSPITAL_COMMUNITY)
Admission: EM | Admit: 2019-04-27 | Discharge: 2019-04-27 | Discharge status: Absent without leave | Payer: Medicare Other

## 2019-04-27 ENCOUNTER — Other Ambulatory Visit: Payer: Self-pay

## 2019-04-29 ENCOUNTER — Encounter: Payer: Self-pay | Admitting: Gynecology

## 2019-04-29 ENCOUNTER — Other Ambulatory Visit: Payer: Self-pay

## 2019-04-29 ENCOUNTER — Ambulatory Visit (INDEPENDENT_AMBULATORY_CARE_PROVIDER_SITE_OTHER): Payer: Medicare Other | Admitting: Gynecology

## 2019-04-29 VITALS — BP 118/78

## 2019-04-29 DIAGNOSIS — R300 Dysuria: Secondary | ICD-10-CM

## 2019-04-29 DIAGNOSIS — N898 Other specified noninflammatory disorders of vagina: Secondary | ICD-10-CM

## 2019-04-29 LAB — WET PREP FOR TRICH, YEAST, CLUE

## 2019-04-29 MED ORDER — TRIAMCINOLONE ACETONIDE 0.1 % EX OINT: 1.0000 "application " | TOPICAL_OINTMENT | Freq: Two times a day (BID) | CUTANEOUS | 1 refills | Status: DC

## 2019-04-29 NOTE — Patient Instructions (Signed)
Apply the prescribed cream twice daily to the outside of the vagina for irritation as needed

## 2019-04-29 NOTE — Progress Notes (Signed)
    Alison Cuevas 1945/09/04 [id number]        73 y.o.  G4P0013 presents complaining of vulvar irritation.  Was recently seen for UTI and treated with Macrobid.  Culture did grow out Klebsiella sensitive to Macrodantin.  Called complaining of irritation and was given Diflucan and subsequently Terazol cream which causes a lot of vulvar irritation.  Was restarted on Septra DS because of complaints of burning with urination.  Her urinary symptoms have cleared but continues to have vulvar irritation.  Past medical history,surgical history, problem list, medications, allergies, family history and social history were all reviewed and documented in the EPIC chart.  Directed ROS with pertinent positives and negatives documented in the history of present illness/assessment and plan.  Exam: Caryn Bee assistant Vitals:   04/29/19 1141  BP: 118/78   General appearance:  Normal Abdomen soft nontender without masses guarding rebound Pelvic external BUS vagina with atrophic changes.  Cream noted in the vagina.  Bimanual without masses or tenderness.  Assessment/Plan:  73 y.o. [id number] with history and exam as above.  Wet prep is negative except for cream.  Wet prep shows some bacteria and white cells.  Will culture and follow-up if positive.  Recommend for now triamcinolone 0.1% ointment applied twice daily externally to see if this does not help with her irritation.  Follow-up if her symptoms persist, worsen or recur.    [name] MD, 12:01 PM 04/29/2019

## 2019-04-30 ENCOUNTER — Telehealth: Payer: Self-pay

## 2019-04-30 NOTE — Telephone Encounter (Signed)
Patient called to check if urine culture results were in. She is still hurting and burning externally. I called her back and she said currently the pain is better and she is resting. I advised urine culture not back yet. Hopefully tomorrow.

## 2019-05-01 LAB — URINE CULTURE
MICRO NUMBER:: 1054267
Result:: NO GROWTH
SPECIMEN QUALITY:: ADEQUATE

## 2019-05-01 LAB — URINALYSIS, COMPLETE W/RFL CULTURE
Bilirubin Urine: NEGATIVE
Glucose, UA: NEGATIVE
Hgb urine dipstick: NEGATIVE
Hyaline Cast: NONE SEEN /LPF
Ketones, ur: NEGATIVE
Nitrites, Initial: NEGATIVE
Protein, ur: NEGATIVE
RBC / HPF: NONE SEEN /HPF (ref 0–2)
Specific Gravity, Urine: 1.015 (ref 1.001–1.03)
pH: 6 (ref 5.0–8.0)

## 2019-05-01 LAB — CULTURE INDICATED

## 2019-05-03 ENCOUNTER — Telehealth: Payer: Self-pay

## 2019-05-03 NOTE — Telephone Encounter (Signed)
Patient called to inquire regarding her urine culture result. Informed negative.  1. Patient said she is still having terrible burning externally on the skin. The Triamcinolone cream is offering no relief today and she said she is miserable.  2. Also, c/o "hurting in the bottom of my stomach".  Questions does she need to go see urologist, GI MD, have an ultrasound. She said she will do whatever you recommend.

## 2019-05-03 NOTE — Telephone Encounter (Signed)
Spoke with patient and informed her. °

## 2019-05-03 NOTE — Telephone Encounter (Signed)
For the stomach pain I would probably see a gastroenterologist.  The triamcinolone cream takes a little bit to work so she is only been on it for several days.  I would give it a little bit longer to see if it does not help with the burning.  If not we can go to a stronger steroid cream.

## 2019-05-08 DIAGNOSIS — H7291 Unspecified perforation of tympanic membrane, right ear: Secondary | ICD-10-CM | POA: Diagnosis not present

## 2019-05-08 DIAGNOSIS — Z9889 Other specified postprocedural states: Secondary | ICD-10-CM | POA: Diagnosis not present

## 2019-05-08 DIAGNOSIS — H903 Sensorineural hearing loss, bilateral: Secondary | ICD-10-CM | POA: Diagnosis not present

## 2019-05-09 DIAGNOSIS — N952 Postmenopausal atrophic vaginitis: Secondary | ICD-10-CM | POA: Diagnosis not present

## 2019-05-09 DIAGNOSIS — R300 Dysuria: Secondary | ICD-10-CM | POA: Diagnosis not present

## 2019-05-28 ENCOUNTER — Other Ambulatory Visit: Payer: Self-pay

## 2019-05-28 DIAGNOSIS — H9011 Conductive hearing loss, unilateral, right ear, with unrestricted hearing on the contralateral side: Secondary | ICD-10-CM | POA: Insufficient documentation

## 2019-05-30 ENCOUNTER — Other Ambulatory Visit: Payer: Self-pay

## 2019-05-30 ENCOUNTER — Encounter: Payer: Self-pay | Admitting: Family Medicine

## 2019-05-30 ENCOUNTER — Ambulatory Visit (INDEPENDENT_AMBULATORY_CARE_PROVIDER_SITE_OTHER): Payer: Medicare Other | Admitting: Family Medicine

## 2019-05-30 VITALS — BP 121/59 | HR 67 | Wt 130.0 lb

## 2019-05-30 DIAGNOSIS — F411 Generalized anxiety disorder: Secondary | ICD-10-CM | POA: Diagnosis not present

## 2019-05-30 DIAGNOSIS — I10 Essential (primary) hypertension: Secondary | ICD-10-CM

## 2019-05-30 DIAGNOSIS — M542 Cervicalgia: Secondary | ICD-10-CM | POA: Diagnosis not present

## 2019-05-30 DIAGNOSIS — M545 Low back pain, unspecified: Secondary | ICD-10-CM

## 2019-05-30 DIAGNOSIS — G8929 Other chronic pain: Secondary | ICD-10-CM | POA: Diagnosis not present

## 2019-05-30 DIAGNOSIS — G894 Chronic pain syndrome: Secondary | ICD-10-CM | POA: Diagnosis not present

## 2019-05-30 DIAGNOSIS — M25551 Pain in right hip: Secondary | ICD-10-CM

## 2019-05-30 NOTE — Progress Notes (Signed)
Virtual Visit via Video Note  I connected with pt on 05/30/19 at 10:30 AM EST by telephone (a video enabled telemedicine application was not an option due to pt's lack of necessary technology) and verified that I am speaking with the correct person using two identifiers.  Location patient: home Location provider:work or home office Persons participating in the virtual visit: patient, pt's husband, pt's son Ellin Saba, myself.  I discussed the limitations of evaluation and management by telemedicine/telephone and the availability of in person appointments. The patient expressed understanding and agreed to proceed.  Telemedicine visit is a necessity given the COVID-19 restrictions in place at the current time.  HPI: 73 y/o WF being seen today for f/u 3 mo f/u HTN, anxiety, and chronic pain syndrome.  Indication for chronic opioid:chronic low back and neck pain from DDD, not a candidate for further surgical intervention, failed ESI. Medication and dose:vicodin 5/325, 1 tid prn # pills per month: 90--this lasts approx 2-3 mo Last UDS date:04/03/19 Opioid Treatment Agreement signed (Y/N):Y, 04/03/19 Opioid Treatment Agreement last reviewed with patient:today N[facility] reviewed this encounter (include red flags):today, no red flags. Vicodin last RF'd7/29/20, #90, rx by me.  PMP AWARE reviewed and no red flags noted. LB and R hip pain as well as neck pains: decent control with meloxicam and prn vicodin. Typically 0-3 tabs per day.  No adverse side effects.  HTN: home bp's 120/70s avg.  GAD with recent adjustment d/o: has been helped by 0.5mg  alpraz tid LONG TERM. PMP AWARE reviewed today->most recent alpraz filled 04/05/19, #90, rx by me.   ROS: See pertinent positives and negatives per HPI.  Past Medical History:  Diagnosis Date  . Allergic rhinitis    maple pollen  . Anxiety   . Arthritis   . Bowel obstruction ([place])   . Chronic low back pain    Dr. Trenton Gammon.  Has had back  injection--bp elevated after.  . Chronic pain syndrome   . Chronic renal insufficiency, stage 2 (mild)    GFR 60-70  . DDD (degenerative disc disease), cervical    Hx of ACDF (Dr. [name]).  Followed by Dr. [name].  Also, Dr. [name] do left C7-T1 intralaminal epidural injection.  . Diverticulosis of colon   . DJD (degenerative joint disease)   . Gallstones   . GERD (gastroesophageal reflux disease)   . Herpes zoster 07/08/2014  . Hypercholesteremia    mild; pt declined statin trial 05/2014--needs recheck lipid panel at first f/u visit in 2017  . Hypertension   . Osteopenia 06/2017   T score -1.4 FRAX 15% / 2%  . Ovarian cyst 2017   82019-robotic assisted bilatel SPO: all path benign.  . Perianal dermatitis    prn cutivate  . Phlebitis   . Right knee injury 2018   Patellofemoral crush injury--Dr. [name].  . Trochanteric bursitis of right hip    Recurrent (injection by Dr. [name] 05/26/15)  . Tympanic membrane rupture, right 12/01/2016   As of 08/2018 pt set for tympanomastoidectomy and STSG (Dr. [name]). Recurrent fungal/bact OE.  . Varicose vein    left leg     Past Surgical History:  Procedure Laterality Date  . ABDOMINAL HYSTERECTOMY  03/2015   TAH  . ANTERIOR AND POSTERIOR REPAIR N/A 03/18/2014   Procedure: Cystocele repair with graft, Vault suspension, Rectocele repair;  Surgeon: [name], MD;  Location: [facility];  Service: Urology;  Laterality: N/A;  . CHOLECYSTECTOMY    . CHOLECYSTECTOMY, LAPAROSCOPIC    .  COLON SURGERY    . COLONOSCOPY  04/2006; 05/26/16   2007 (Dr. [name]): Normal.  04/2016 (Dr. [name]) normal except diverticulosis and decreased anal sphincter tone.  No repeat colonoscopy is recommended due to age.  . cspine surgery     Dr. Wiliam Ke level ant cerv discectomy /fusion w/plating  . CYSTO N/A 03/18/2014   Procedure: CYSTO;  Surgeon: [name], MD;  Location: [facility];  Service: Urology;  Laterality: N/A;  . DEXA   07/30/2015; 06/2018   2017 and 2020 -->Osteopenia--repeat 2 yrs.  Marland Kitchen LYSIS OF ADHESION N/A 01/31/2018   Procedure: POSSIBLE LYSIS OF ADHESION;  Surgeon: Everitt Amber, MD;  Location: [facility];  Service: Gynecology;  Laterality: N/A;  . [place]     with prolasped bledder repair  . RESECTION OF COLON     BENIGN TUMOR  . RIGHT EAR SURGERY  08/23/2017   Dr. 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 right ear surgery  12/12/2018   Right tympanomastoidectomy, ossiculoplasty with partial prosthesis, split thickness skin graft from postauricular skin 1x1cm, and right tragal cartilage graft 1x1 cm [facility])  . right hemicolectomy for diverticulitis with abscess  1993  . ROBOTIC ASSISTED BILATERAL SALPINGO OOPHERECTOMY Bilateral 01/31/2018   All PATH benign.  Procedure: XI ROBOTIC ASSISTED BILATERAL SALPINGO OOPHORECTOMY;  Surgeon: Everitt Amber, MD;  Location: [facility];  Service: Gynecology;  Laterality: Bilateral;  . TUBAL LIGATION      Family History  Problem Relation Age of Onset  . Hypertension Mother   . Diverticulitis Mother   . Breast cancer Sister 23  . Melanoma Brother   . Prostate cancer Brother   . Leukemia Brother   . Lupus Sister   . Lung disease Brother   . Stomach cancer Father   . Other Other        Family member with MGUS    Current Outpatient Medications:  .  ALPRAZolam (XANAX) 0.5 MG tablet, TAKE 1 TABLET BY MOUTH THREE TIMES A DAY AS NEEDED FOR ANXIETY, Disp: 90 tablet, Rfl: 5 .  aspirin EC 81 MG tablet, Take 81 mg by mouth daily., Disp: , Rfl:  .  betamethasone valerate ointment (VALISONE) 0.1 %, APPLY A SMALL AMOUNT TO AFFECTED AREA TWICE A DAY, Disp: , Rfl:  .  Biotin 10000 MCG TABS, Take 10,000 mcg by mouth daily. , Disp: , Rfl:  .  calcium carbonate (OSCAL) 1500 (600 Ca) MG TABS tablet, Take 600 mg of elemental calcium by mouth daily  with breakfast., Disp: , Rfl:  .  Carboxymethylcellul-Glycerin (LUBRICATING EYE DROPS OP), Place 1 drop into both eyes daily as needed (dry eyes)., Disp: , Rfl:  .  Cholecalciferol (VITAMIN D3) 5000 units CAPS, Take 5,000 Units by mouth daily., Disp: , Rfl:  .  estradiol (ESTRACE VAGINAL) 0.1 MG/GM vaginal cream, Apply 1/4 applicator 3 times weekly vaginally., Disp: 42.5 g, Rfl: 2 .  fluticasone (CUTIVATE) 0.05 % cream, Apply to affected area bid prn (Patient taking differently: Apply 1 application topically 2 (two) times daily as needed (irritation). ), Disp: 30 g, Rfl: 1 .  fluticasone (FLONASE) 50 MCG/ACT nasal spray, Place 2 sprays into both nostrils at bedtime., Disp: 48 g, Rfl: 3 .  HYDROcodone-acetaminophen (NORCO/VICODIN) 5-325 MG tablet, 1 tab po tid prn pain, Disp: 90 tablet, Rfl: 0 .  Lactobacillus-Inulin (CULTURELLE DIGESTIVE HEALTH PO), Take 1 tablet by  mouth daily as needed (upset stomach). , Disp: , Rfl:  .  lisinopril (ZESTRIL) 10 MG tablet, TAKE 1 TABLET BY MOUTH EVERY DAY, Disp: 90 tablet, Rfl: 1 .  loratadine (CLARITIN) 10 MG tablet, Take 10 mg by mouth daily., Disp: , Rfl:  .  meloxicam (MOBIC) 15 MG tablet, TAKE 1 TABLET EVERY DAY WITH FOOD AS NEEDED FOR MUSCULOSKELETAL PAIN, Disp: 30 tablet, Rfl: 6 .  Multiple Minerals-Vitamins (CALCIUM-MAGNESIUM-ZINC-D3 PO), Take by mouth. Pt takes 1 daily., Disp: , Rfl:  .  Multiple Vitamins-Minerals (MULTIVITAMIN ADULT) TABS, Take by mouth daily., Disp: , Rfl:  .  ondansetron (ZOFRAN) 4 MG tablet, Take 1 tablet (4 mg total) by mouth every 8 (eight) hours as needed for nausea or vomiting., Disp: 30 tablet, Rfl: 1 .  POTASSIUM PO, Take 1 tablet by mouth daily. , Disp: , Rfl:  .  POWDERS EX, Use one puff in affected ear(s) daily or as directed by MD, Disp: , Rfl:  .  PREVIDENT 5000 BOOSTER PLUS 1.1 % PSTE, BRUSH TEETH 2 TO 3 TIMES A DAY. AFTER USE SPIT OUT, BUT DO NOT RINSE., Disp: , Rfl: 6 .  Simethicone (GAS-X PO), Take 1 tablet by mouth  daily as needed (gas)., Disp: , Rfl:  .  triamcinolone ointment (KENALOG) 0.1 %, Apply 1 application topically 2 (two) times daily., Disp: 30 g, Rfl: 1 .  verapamil (CALAN-SR) 180 MG CR tablet, TAKE 1 TABLET BY MOUTH EVERYDAY AT BEDTIME, Disp: 90 tablet, Rfl: 1 .  vitamin B-12 (CYANOCOBALAMIN) 1000 MCG tablet, Take 2,000 mcg by mouth daily., Disp: , Rfl:  .  CIPRODEX OTIC suspension, Place 4-5 drops into the right ear 2 (two) times daily. , Disp: , Rfl: 0 .  CRANBERRY EXTRACT PO, Take 1 tablet by mouth daily. , Disp: , Rfl:  .  famotidine (PEPCID) 40 MG tablet, TAKE 1 TABLET BY MOUTH TWICE A DAY (Patient not taking: Reported on 05/30/2019), Disp: 180 tablet, Rfl: 1 .  ofloxacin (FLOXIN) 0.3 % OTIC solution, Place 5 drops into the right ear 2 (two) times daily. For up to 7 days for ear pain or drainage., Disp: , Rfl: 0 .  Red Yeast Rice 600 MG CAPS, Take 600 mg by mouth daily. , Disp: , Rfl:   EXAM:  VITALS per patient if applicable: BP (!) 123XX123 (BP Location: Left Arm, Patient Position: Sitting, Cuff Size: Normal)   Pulse 67   Wt 130 lb (59 kg)   LMP 06/27/1972   BMI 25.39 kg/m    GENERAL: alert, oriented, sounds well and in no acute distress No further exam b/c audio visit only.   LABS: none today  Lab Results  Component Value Date   TSH 1.69 04/03/2019   Lab Results  Component Value Date   WBC 6.4 04/03/2019   HGB 13.2 04/03/2019   HCT 39.5 04/03/2019   MCV 90.3 04/03/2019   PLT 176.0 04/03/2019   Lab Results  Component Value Date   CREATININE 0.86 04/03/2019   CREATININE 0.86 04/03/2019   BUN 16 04/03/2019   BUN 16 04/03/2019   NA 134 (L) 04/03/2019   NA 134 (L) 04/03/2019   K 4.4 04/03/2019   K 4.4 04/03/2019   CL 99 04/03/2019   CL 99 04/03/2019   CO2 27 04/03/2019   CO2 27 04/03/2019   Lab Results  Component Value Date   ALT 17 04/03/2019   AST 22 04/03/2019   ALKPHOS 75 04/03/2019   BILITOT 0.6 04/03/2019  Lab Results  Component Value Date   CHOL  165 03/22/2018   Lab Results  Component Value Date   HDL 60.90 03/22/2018   Lab Results  Component Value Date   LDLCALC 89 03/22/2018   Lab Results  Component Value Date   TRIG 75.0 03/22/2018   Lab Results  Component Value Date   CHOLHDL 3 03/22/2018   Lab Results  Component Value Date   HGBA1C 4.9 01/30/2017    ASSESSMENT AND PLAN:  Discussed the following assessment and plan:  1) Chronic pain syndrome: neck DDD, lumbar DDD, right hip osteoarthritis. The current medical regimen is effective;  continue present plan and medications. No new rxs needed today.  2) GAD: The current medical regimen is effective;  continue present plan and medications. No new rx needed today. She does not want antidepressant trial.  3) HTN: The current medical regimen is effective;  continue present plan and medications. Lytes/cr normal 03/2019. Continue lisinopril and verapamil.  -we discussed possible serious and likely etiologies, options for evaluation and workup, limitations of telemedicine visit vs in person visit, treatment, treatment risks and precautions. Pt prefers to treat via telemedicine empirically rather then risking or undertaking an in person visit at this moment. Patient agrees to seek prompt in person care if worsening, new symptoms arise, or if is not improving with treatment.   I discussed the assessment and treatment plan with the patient. The patient was provided an opportunity to ask questions and all were answered. The patient agreed with the plan and demonstrated an understanding of the instructions.    The patient was advised to call back or seek an in-person evaluation if the symptoms worsen or if the condition fails to improve as anticipated.   Spent 10 min with pt today, with >50% of this time spent in counseling and care coordination regarding the above problems.  F/u: 3 mo  Signed:  Crissie Sickles, MD           05/30/2019

## 2019-06-04 ENCOUNTER — Other Ambulatory Visit (HOSPITAL_COMMUNITY): Payer: Self-pay | Admitting: Gynecology

## 2019-06-04 DIAGNOSIS — Z1231 Encounter for screening mammogram for malignant neoplasm of breast: Secondary | ICD-10-CM

## 2019-06-11 ENCOUNTER — Other Ambulatory Visit: Payer: Self-pay | Admitting: Family Medicine

## 2019-06-11 NOTE — Telephone Encounter (Signed)
[facility] requesting RF of Meloxicam. Last OV 12.3.2020 Next OV none. Last RF 10/22/2018 # 30 x 6 RFS.  Please advise.

## 2019-06-14 ENCOUNTER — Other Ambulatory Visit: Payer: Self-pay | Admitting: Family Medicine

## 2019-06-14 DIAGNOSIS — N390 Urinary tract infection, site not specified: Secondary | ICD-10-CM | POA: Diagnosis not present

## 2019-06-14 DIAGNOSIS — N8111 Cystocele, midline: Secondary | ICD-10-CM | POA: Diagnosis not present

## 2019-06-14 MED ORDER — HYDROCODONE-ACETAMINOPHEN 5-325 MG PO TABS: ORAL_TABLET | ORAL | 0 refills | Status: DC

## 2019-06-14 NOTE — Telephone Encounter (Signed)
Requesting: Hydrocodone Contract:03/06/19 UDS:04/03/19 Last Visit:05/30/19 Next Visit: advised to f/u 3 mo. Last Refill: 01/23/19 (90,0)  Please Advise. Medication pending

## 2019-06-14 NOTE — Telephone Encounter (Signed)
Patient needs Rx Hydrocodone. Please call if problems.

## 2019-06-18 DIAGNOSIS — H903 Sensorineural hearing loss, bilateral: Secondary | ICD-10-CM | POA: Diagnosis not present

## 2019-06-18 DIAGNOSIS — H7211 Attic perforation of tympanic membrane, right ear: Secondary | ICD-10-CM | POA: Diagnosis not present

## 2019-06-18 DIAGNOSIS — Z9889 Other specified postprocedural states: Secondary | ICD-10-CM | POA: Diagnosis not present

## 2019-06-18 DIAGNOSIS — H7291 Unspecified perforation of tympanic membrane, right ear: Secondary | ICD-10-CM | POA: Diagnosis not present

## 2019-06-18 DIAGNOSIS — Z79899 Other long term (current) drug therapy: Secondary | ICD-10-CM | POA: Diagnosis not present

## 2019-07-03 ENCOUNTER — Ambulatory Visit: Payer: Medicare Other | Admitting: Internal Medicine

## 2019-07-11 ENCOUNTER — Other Ambulatory Visit: Payer: Self-pay

## 2019-07-11 ENCOUNTER — Ambulatory Visit (HOSPITAL_COMMUNITY)
Admission: RE | Admit: 2019-07-11 | Discharge: 2019-07-11 | Disposition: A | Discharge status: Home / Self Care | Payer: Medicare Other | Source: Ambulatory Visit | Attending: Gynecology | Admitting: Gynecology

## 2019-07-11 DIAGNOSIS — Z1231 Encounter for screening mammogram for malignant neoplasm of breast: Secondary | ICD-10-CM | POA: Diagnosis not present

## 2019-07-18 ENCOUNTER — Encounter: Payer: Self-pay | Admitting: Family Medicine

## 2019-07-18 ENCOUNTER — Telehealth: Payer: Self-pay

## 2019-07-18 ENCOUNTER — Other Ambulatory Visit: Payer: Self-pay

## 2019-07-18 ENCOUNTER — Ambulatory Visit (INDEPENDENT_AMBULATORY_CARE_PROVIDER_SITE_OTHER): Payer: Medicare Other | Admitting: Family Medicine

## 2019-07-18 VITALS — BP 167/90 | HR 66 | Temp 98.7°F | Ht 60.0 in | Wt 134.4 lb

## 2019-07-18 DIAGNOSIS — S8012XA Contusion of left lower leg, initial encounter: Secondary | ICD-10-CM

## 2019-07-18 MED ORDER — MECLIZINE HCL 12.5 MG PO TABS: ORAL_TABLET | ORAL | 1 refills | Status: DC

## 2019-07-18 NOTE — Telephone Encounter (Signed)
Yes, ok to use 4 oclock slot today.

## 2019-07-18 NOTE — Telephone Encounter (Signed)
Patient fell and has a knot on her left leg. Patient is requesting in office appointment today if possible.

## 2019-07-18 NOTE — Telephone Encounter (Signed)
Patient advised and scheduled.  Patient was told this is ACUTE ONLY, as patient began to mention other issues and needs.  She voiced understanding.

## 2019-07-18 NOTE — Telephone Encounter (Signed)
First available is today at 4. Please advise if appropriate slot to use for patient, thanks.

## 2019-07-18 NOTE — Patient Instructions (Addendum)
Apply ice to the bruise on your left leg for 15-20 minutes 2-3 times per day for the next 1 week. Then continue with icing once a day until the swelling and pain is significantly improved. Take your hydrocodone as needed for severe pain. Stop meloxicam until your bruise is significantly improved. You can stop taking aspirin.

## 2019-07-18 NOTE — Progress Notes (Signed)
OFFICE VISIT  07/18/2019   CC:  Chief Complaint  Patient presents with  . Left leg injury    x1 week   HPI:    Patient is a 74 y.o. Caucasian female who presents for "left leg injury". One week ago tripped over a vine or stump and fell and hit L lower leg mid tibia region. Has not taken any meds.  BP this morning 0000000 systolic. Usually pretty normal upon routine home bp monitoring.  Past Medical History:  Diagnosis Date  . Allergic rhinitis    maple pollen  . Anxiety   . Arthritis   . Bowel obstruction ([place])   . Chronic low back pain    Dr. Trenton Gammon.  Has had back injection--bp elevated after.  . Chronic pain syndrome   . Chronic renal insufficiency, stage 2 (mild)    GFR 60-70  . DDD (degenerative disc disease), cervical    Hx of ACDF (Dr. [name]).  Followed by Dr. [name].  Also, Dr. [name] do left C7-T1 intralaminal epidural injection.  . Diverticulosis of colon   . DJD (degenerative joint disease)   . Gallstones   . GERD (gastroesophageal reflux disease)   . Herpes zoster 07/08/2014  . Hypercholesteremia    mild; pt declined statin trial 05/2014--needs recheck lipid panel at first f/u visit in 2017  . Hypertension   . Osteopenia 06/2017   T score -1.4 FRAX 15% / 2%  . Ovarian cyst 2017   82019-robotic assisted bilatel SPO: all path benign.  . Perianal dermatitis    prn cutivate  . Phlebitis   . Right knee injury 2018   Patellofemoral crush injury--Dr. [name].  . Trochanteric bursitis of right hip    Recurrent (injection by Dr. [name] 05/26/15)  . Tympanic membrane rupture, right 12/01/2016   As of 08/2018 pt set for tympanomastoidectomy and STSG (Dr. [name]). Recurrent fungal/bact OE.  . Varicose vein    left leg     Past Surgical History:  Procedure Laterality Date  . ABDOMINAL HYSTERECTOMY  03/2015   TAH  . ANTERIOR AND POSTERIOR REPAIR N/A 03/18/2014   Procedure: Cystocele repair with graft, Vault suspension, Rectocele repair;   Surgeon: [name], MD;  Location: [facility];  Service: Urology;  Laterality: N/A;  . CHOLECYSTECTOMY    . CHOLECYSTECTOMY, LAPAROSCOPIC    . COLON SURGERY    . COLONOSCOPY  04/2006; 05/26/16   2007 (Dr. [name]): Normal.  04/2016 (Dr. [name]) normal except diverticulosis and decreased anal sphincter tone.  No repeat colonoscopy is recommended due to age.  . cspine surgery     Dr. Wiliam Ke level ant cerv discectomy /fusion w/plating  . CYSTO N/A 03/18/2014   Procedure: CYSTO;  Surgeon: [name], MD;  Location: [facility];  Service: Urology;  Laterality: N/A;  . DEXA  07/30/2015; 06/2018   2017 and 2020 -->Osteopenia--repeat 2 yrs.  Marland Kitchen LYSIS OF ADHESION N/A 01/31/2018   Procedure: POSSIBLE LYSIS OF ADHESION;  Surgeon: Everitt Amber, MD;  Location: [facility];  Service: Gynecology;  Laterality: N/A;  . Allen     with prolasped bledder repair  . RESECTION OF COLON     BENIGN TUMOR  . RIGHT EAR SURGERY  08/23/2017   Dr. 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 right ear surgery  12/12/2018   Right tympanomastoidectomy, ossiculoplasty with partial prosthesis, split thickness skin  graft from postauricular skin 1x1cm, and right tragal cartilage graft 1x1 cm [facility])  . right hemicolectomy for diverticulitis with abscess  1993  . ROBOTIC ASSISTED BILATERAL SALPINGO OOPHERECTOMY Bilateral 01/31/2018   All PATH benign.  Procedure: XI ROBOTIC ASSISTED BILATERAL SALPINGO OOPHORECTOMY;  Surgeon: Everitt Amber, MD;  Location: [facility];  Service: Gynecology;  Laterality: Bilateral;  . TUBAL LIGATION      Outpatient Medications Prior to Visit  Medication Sig Dispense Refill  . ALPRAZolam (XANAX) 0.5 MG tablet TAKE 1 TABLET BY MOUTH THREE TIMES A DAY AS NEEDED FOR ANXIETY 90 tablet 5  . ascorbic acid (VITAMIN C) 1000 MG tablet Take by mouth.    .  betamethasone valerate ointment (VALISONE) 0.1 % APPLY A SMALL AMOUNT TO AFFECTED AREA TWICE A DAY    . Biotin 10000 MCG TABS Take 10,000 mcg by mouth daily.     . calcium carbonate (OSCAL) 1500 (600 Ca) MG TABS tablet Take 600 mg of elemental calcium by mouth daily with breakfast.    . Carboxymethylcellul-Glycerin (LUBRICATING EYE DROPS OP) Place 1 drop into both eyes daily as needed (dry eyes).    . Cholecalciferol (VITAMIN D3) 5000 units CAPS Take 5,000 Units by mouth daily.    Marland Kitchen CRANBERRY EXTRACT PO Take 1 tablet by mouth daily.     . famotidine (PEPCID) 40 MG tablet TAKE 1 TABLET BY MOUTH TWICE A DAY 180 tablet 1  . fluticasone (CUTIVATE) 0.05 % cream Apply to affected area bid prn (Patient taking differently: Apply 1 application topically 2 (two) times daily as needed (irritation). ) 30 g 1  . fluticasone (FLONASE) 50 MCG/ACT nasal spray Place 2 sprays into both nostrils at bedtime. 48 g 3  . HYDROcodone-acetaminophen (NORCO/VICODIN) 5-325 MG tablet 1 tab po tid prn pain 90 tablet 0  . Lactobacillus-Inulin (CULTURELLE DIGESTIVE HEALTH PO) Take 1 tablet by mouth daily as needed (upset stomach).     . lisinopril (ZESTRIL) 10 MG tablet TAKE 1 TABLET BY MOUTH EVERY DAY 90 tablet 1  . loratadine (CLARITIN) 10 MG tablet Take 10 mg by mouth every other day.     . meloxicam (MOBIC) 15 MG tablet TAKE 1 TABLET BY MOUTH EVERY DAY WITH FOOD AS NEEDED FOR MUSCULOSKELETAL PAIN 30 tablet 6  . Multiple Minerals-Vitamins (CALCIUM-MAGNESIUM-ZINC-D3 PO) Take by mouth. Pt takes 1 daily.    . Multiple Vitamins-Minerals (MULTIVITAMIN ADULT) TABS Take by mouth daily.    Marland Kitchen ofloxacin (FLOXIN) 0.3 % OTIC solution Place 5 drops into the right ear 2 (two) times daily. For up to 7 days for ear pain or drainage.  0  . POTASSIUM PO Take 1 tablet by mouth daily.     Marland Kitchen PREVIDENT 5000 BOOSTER PLUS 1.1 % PSTE BRUSH TEETH 2 TO 3 TIMES A DAY. AFTER USE SPIT OUT, BUT DO NOT RINSE.  6  . Simethicone (GAS-X PO) Take 1 tablet by  mouth daily as needed (gas).    . triamcinolone ointment (KENALOG) 0.1 % Apply 1 application topically 2 (two) times daily. 30 g 1  . verapamil (CALAN-SR) 180 MG CR tablet TAKE 1 TABLET BY MOUTH EVERYDAY AT BEDTIME 90 tablet 1  . vitamin B-12 (CYANOCOBALAMIN) 1000 MCG tablet Take 2,000 mcg by mouth daily.    Marland Kitchen aspirin EC 81 MG tablet Take 81 mg by mouth daily.    Marland Kitchen CIPRODEX OTIC suspension Place 4-5 drops into the right ear 2 (two) times daily.   0  . estradiol (ESTRACE VAGINAL) 0.1 MG/GM vaginal  cream Apply 1/4 applicator 3 times weekly vaginally. (Patient not taking: Reported on 07/18/2019) 42.5 g 2  . ondansetron (ZOFRAN) 4 MG tablet Take 1 tablet (4 mg total) by mouth every 8 (eight) hours as needed for nausea or vomiting. (Patient not taking: Reported on 07/18/2019) 30 tablet 1  . POWDERS EX Use one puff in affected ear(s) daily or as directed by MD    . Red Yeast Rice 600 MG CAPS Take 600 mg by mouth daily.      No facility-administered medications prior to visit.    Allergies  Allergen Reactions  . Gabapentin Other (See Comments)    Elevated heart rate and crazy dreams  . Prednisone     REACTION: funny feeling    ROS As per HPI  PE: Repeat bp today manually at end of visit was 140/82 Blood pressure (!) 167/90, pulse 66, temperature 98.7 F (37.1 C), temperature source Temporal, height 5' (1.524 m), weight 134 lb 6.4 oz (61 kg), last menstrual period 06/27/1972, SpO2 93 %. Body mass index is 26.25 kg/m.  Gen: Alert, tired appearing.  Patient is oriented to person, place, time, and situation. AFFECT: flat/morose, lucid thought and speech. R EAC clear other than scant amount of purplish dry debris.  No fluid or edematous or erythematous mucosa. L EAC clear, TM normal. [id number]: no injection, icteris, swelling, or exudate.  EOMI, PERRLA. Mouth: lips without lesion/swelling.  Oral mucosa pink and moist. Oropharynx without erythema, exudate, or swelling.  Neck and traps: diffuse  TTP bilat.  Anterior and lateral neck w/out mass or tenderness. CV: RRR, no m/r/g.   LUNGS: CTA bilat, nonlabored resps, good aeration in all lung fields. Left lower leg with fairly large area of confluent ecchymoses with some mild focal swelling centrally in this area--located in pretibial region.  +warmth and tenderness over the area.  No erythema.  Large chronic varicosities in the area of the bruise/hematoma.  No streaking.  No abrasion or laceration.   LABS:  Lab Results  Component Value Date   WBC 6.4 04/03/2019   HGB 13.2 04/03/2019   HCT 39.5 04/03/2019   MCV 90.3 04/03/2019   PLT 176.0 04/03/2019     Chemistry      Component Value Date/Time   NA 134 (L) 04/03/2019 1024   NA 134 (L) 04/03/2019 1024   K 4.4 04/03/2019 1024   K 4.4 04/03/2019 1024   CL 99 04/03/2019 1024   CL 99 04/03/2019 1024   CO2 27 04/03/2019 1024   CO2 27 04/03/2019 1024   BUN 16 04/03/2019 1024   BUN 16 04/03/2019 1024   CREATININE 0.86 04/03/2019 1024   CREATININE 0.86 04/03/2019 1024   CREATININE 0.82 12/19/2017 1207      Component Value Date/Time   CALCIUM 9.6 04/03/2019 1024   CALCIUM 9.6 04/03/2019 1024   ALKPHOS 75 04/03/2019 1024   AST 22 04/03/2019 1024   ALT 17 04/03/2019 1024   BILITOT 0.6 04/03/2019 1024     Lab Results  Component Value Date   HGBA1C 4.9 01/30/2017    IMPRESSION AND PLAN:  1) Contusion of left lower leg pretibial region.  Has ecchymoses with fairly small area of hematoma.  No suspicion of fracture or infection. Icing regimen discussed, prn use of her hydrocodone discussed. Recommended she stop meloxicam until this gets a lot better. She has no known vascular dz so I told her she really doesn't have any indication to take aspirin and she can stop taking this  med now.  She suffers from malaise pretty chronically, largely secondary to her chronic neck pain, recurrent R ear pain, severe anxiety, and this has all flared up with this recent injury to her leg. I  rx'd meclizine today for intermittent feeling of disequilibrium she has had ever since her R ear problem/injury a couple of years ago.    An After Visit Summary was printed and given to the patient.  FOLLOW UP: Return if symptoms worsen or fail to improve.  Signed:  Crissie Sickles, MD           07/18/2019

## 2019-07-19 ENCOUNTER — Ambulatory Visit: Payer: Medicare Other | Admitting: Family Medicine

## 2019-07-26 ENCOUNTER — Telehealth: Payer: Self-pay

## 2019-07-26 NOTE — Telephone Encounter (Signed)
Would she be able to take Allegra along w/ Claritin or just Claritin?

## 2019-07-26 NOTE — Telephone Encounter (Signed)
Don't take both, just choose either allegra or claritin and stick with it.

## 2019-07-26 NOTE — Telephone Encounter (Signed)
Pt was called and given information.  

## 2019-07-26 NOTE — Telephone Encounter (Signed)
OK to take meloxicam now.

## 2019-07-26 NOTE — Telephone Encounter (Signed)
Can patient start taking her meloxicam again? The patient has tried tylenol and the hydrocodone have not helped with her headache. Patient also inquired about Allerga with Clariton to help with the pressure behind her eyes. The pharmacist advised patient not to. Please call.

## 2019-07-26 NOTE — Telephone Encounter (Signed)
Patient had a visit last week on 07/18/19 and advised to "take your hydrocodone as needed for severe pain. Stop meloxicam until your bruise is significantly improved."  Please advise, thanks.

## 2019-08-01 DIAGNOSIS — Z23 Encounter for immunization: Secondary | ICD-10-CM | POA: Diagnosis not present

## 2019-08-05 ENCOUNTER — Telehealth: Payer: Self-pay

## 2019-08-05 NOTE — Telephone Encounter (Signed)
Patient mentioned she was due for bloodwork. I did not see any future labs in or mention of this in last f/u [facility] note from 05/30/19.   Please advise, thanks.

## 2019-08-05 NOTE — Telephone Encounter (Signed)
D/c completely

## 2019-08-05 NOTE — Telephone Encounter (Signed)
Patient was advised during last visit (07/18/19) to stop baby aspirin."She has no known vascular dz so I told her she really doesn't have any indication to take aspirin and she can stop taking this med now". Patient called asking if she can go back on it or needs to d/c completely.  Please advise, thanks.

## 2019-08-05 NOTE — Telephone Encounter (Signed)
Reassure her that no labs are needed right now. She needs f/u in early March regarding pain meds and we can do some labs at that time.

## 2019-08-06 DIAGNOSIS — Z9889 Other specified postprocedural states: Secondary | ICD-10-CM | POA: Diagnosis not present

## 2019-08-06 DIAGNOSIS — H7291 Unspecified perforation of tympanic membrane, right ear: Secondary | ICD-10-CM | POA: Diagnosis not present

## 2019-08-06 NOTE — Telephone Encounter (Signed)
Patient advised. Appt for April r/s to early March.

## 2019-08-09 ENCOUNTER — Telehealth: Payer: Self-pay | Admitting: Family Medicine

## 2019-08-09 NOTE — Telephone Encounter (Signed)
Patient heard Ibu was not good to take before receiving the second vaccine and wanted to know if she should stop taking mobic as well. Please advise, thanks.

## 2019-08-09 NOTE — Telephone Encounter (Signed)
Patient advised.

## 2019-08-09 NOTE — Telephone Encounter (Signed)
Pt calling to ask if there are any medicines she should stop before getting her second covid vaccine on 3/5. States she was not taking the Mobic at the time of her first vaccine and wants to be sure.

## 2019-08-09 NOTE — Telephone Encounter (Signed)
She doesn't have to stop any medications.

## 2019-08-13 ENCOUNTER — Other Ambulatory Visit: Payer: Self-pay | Admitting: Family Medicine

## 2019-08-15 ENCOUNTER — Encounter: Payer: Medicare Other | Admitting: Obstetrics and Gynecology

## 2019-08-27 ENCOUNTER — Other Ambulatory Visit: Payer: Self-pay

## 2019-08-28 ENCOUNTER — Encounter: Payer: Self-pay | Admitting: Obstetrics and Gynecology

## 2019-08-28 ENCOUNTER — Ambulatory Visit (INDEPENDENT_AMBULATORY_CARE_PROVIDER_SITE_OTHER): Payer: Medicare Other | Admitting: Obstetrics and Gynecology

## 2019-08-28 VITALS — BP 124/76 | Ht 60.0 in | Wt 136.0 lb

## 2019-08-28 DIAGNOSIS — M8580 Other specified disorders of bone density and structure, unspecified site: Secondary | ICD-10-CM

## 2019-08-28 DIAGNOSIS — Z9289 Personal history of other medical treatment: Secondary | ICD-10-CM

## 2019-08-28 DIAGNOSIS — Z01419 Encounter for gynecological examination (general) (routine) without abnormal findings: Secondary | ICD-10-CM | POA: Diagnosis not present

## 2019-08-28 DIAGNOSIS — D1801 Hemangioma of skin and subcutaneous tissue: Secondary | ICD-10-CM

## 2019-08-28 DIAGNOSIS — N951 Menopausal and female climacteric states: Secondary | ICD-10-CM

## 2019-08-28 NOTE — Patient Instructions (Signed)
Please schedule your mammogram this year if you have not already done so

## 2019-08-28 NOTE — Progress Notes (Signed)
Alison Cuevas [birth year] [id number]  SUBJECTIVE:  74 y.o. [id number] female for annual routine gynecologic exam. She has no gynecologic concerns. Mild flashes returning.  Noted a few 'bumps' on vagina for the last week or two.  No pain or bleeding.  Not shaving.   Current Outpatient Medications  Medication Sig Dispense Refill  . ALPRAZolam (XANAX) 0.5 MG tablet TAKE 1 TABLET BY MOUTH THREE TIMES A DAY AS NEEDED FOR ANXIETY 90 tablet 5  . ascorbic acid (VITAMIN C) 1000 MG tablet Take by mouth.    . betamethasone valerate ointment (VALISONE) 0.1 % APPLY A SMALL AMOUNT TO AFFECTED AREA TWICE A DAY    . Biotin 10000 MCG TABS Take 10,000 mcg by mouth daily.     . calcium carbonate (OSCAL) 1500 (600 Ca) MG TABS tablet Take 600 mg of elemental calcium by mouth daily with breakfast.    . Carboxymethylcellul-Glycerin (LUBRICATING EYE DROPS OP) Place 1 drop into both eyes daily as needed (dry eyes).    . Cholecalciferol (VITAMIN D3) 5000 units CAPS Take 5,000 Units by mouth daily.    . famotidine (PEPCID) 40 MG tablet TAKE 1 TABLET BY MOUTH TWICE A DAY 180 tablet 1  . fluticasone (CUTIVATE) 0.05 % cream Apply to affected area bid prn (Patient taking differently: Apply 1 application topically 2 (two) times daily as needed (irritation). ) 30 g 1  . fluticasone (FLONASE) 50 MCG/ACT nasal spray Place 2 sprays into both nostrils at bedtime. 48 g 3  . HYDROcodone-acetaminophen (NORCO/VICODIN) 5-325 MG tablet 1 tab po tid prn pain 90 tablet 0  . Lactobacillus-Inulin (CULTURELLE DIGESTIVE HEALTH PO) Take 1 tablet by mouth daily as needed (upset stomach).     . lisinopril (ZESTRIL) 10 MG tablet TAKE 1 TABLET BY MOUTH EVERY DAY 90 tablet 1  . loratadine (CLARITIN) 10 MG tablet Take 10 mg by mouth every other day.     . meloxicam (MOBIC) 15 MG tablet TAKE 1 TABLET BY MOUTH EVERY DAY WITH FOOD AS NEEDED FOR MUSCULOSKELETAL PAIN 30 tablet 6  . Multiple Minerals-Vitamins (CALCIUM-MAGNESIUM-ZINC-D3 PO) Take by mouth.  Pt takes 1 daily.    . Multiple Vitamins-Minerals (MULTIVITAMIN ADULT) TABS Take by mouth daily.    . ondansetron (ZOFRAN) 4 MG tablet Take 1 tablet (4 mg total) by mouth every 8 (eight) hours as needed for nausea or vomiting. 30 tablet 1  . POTASSIUM PO Take 1 tablet by mouth daily.     Marland Kitchen POWDERS EX Use one puff in affected ear(s) daily or as directed by MD    . PREVIDENT 5000 BOOSTER PLUS 1.1 % PSTE BRUSH TEETH 2 TO 3 TIMES A DAY. AFTER USE SPIT OUT, BUT DO NOT RINSE.  6  . Simethicone (GAS-X PO) Take 1 tablet by mouth daily as needed (gas).    . triamcinolone ointment (KENALOG) 0.1 % Apply 1 application topically 2 (two) times daily. 30 g 1  . verapamil (CALAN-SR) 180 MG CR tablet TAKE 1 TABLET BY MOUTH EVERYDAY AT BEDTIME 30 tablet 0  . vitamin B-12 (CYANOCOBALAMIN) 1000 MCG tablet Take 2,000 mcg by mouth daily.    Marland Kitchen CIPRODEX OTIC suspension Place 4-5 drops into the right ear 2 (two) times daily.   0  . CRANBERRY EXTRACT PO Take 1 tablet by mouth daily.     Marland Kitchen estradiol (ESTRACE VAGINAL) 0.1 MG/GM vaginal cream Apply 1/4 applicator 3 times weekly vaginally. (Patient not taking: Reported on 07/18/2019) 42.5 g 2  . meclizine (ANTIVERT) 12.5 MG tablet  1-2 tabs po tid prn (Patient not taking: Reported on 08/28/2019) 60 tablet 1  . ofloxacin (FLOXIN) 0.3 % OTIC solution Place 5 drops into the right ear 2 (two) times daily. For up to 7 days for ear pain or drainage.  0   No current facility-administered medications for this visit.   Allergies: Gabapentin and Prednisone  Patient's last menstrual period was 06/27/1972.  Past medical history,surgical history, problem list, medications, allergies, family history and social history were all reviewed and documented as reviewed in the EPIC chart.  ROS:  Feeling well. No dyspnea or chest pain on exertion.  No abdominal pain, change in bowel habits, black or bloody stools.  No urinary tract symptoms. GYN ROS: no abnormal bleeding, pelvic pain or discharge,  no breast pain or new or enlarging lumps on self exam. No neurological complaints.   OBJECTIVE:  BP 124/76   Ht 5' (1.524 m)   Wt 136 lb (61.7 kg)   LMP 06/27/1972   BMI 26.56 kg/m  The patient appears well, alert, oriented x 3, in no distress. ENT normal.  Neck supple. No cervical or supraclavicular adenopathy or thyromegaly.  Lungs are clear, good air entry, no wheezes, rhonchi or rales. S1 and S2 normal, no murmurs, regular rate and rhythm.  Abdomen soft without tenderness, guarding, mass or organomegaly.  Neurological is normal, no focal findings.  BREAST EXAM: breasts appear normal, no suspicious masses, no skin or nipple changes or axillary nodes  PELVIC EXAM: VULVA: normal appearing vulva with no masses, tenderness or lesions, a few scattered pinpoint hemangiomas on labia majora bilaterally, VAGINA: normal appearing vagina with normal color and discharge, no lesions, CERVIX: surgically absent, UTERUS: surgically absent, vaginal cuff well healed, ADNEXA: no masses, RECTAL: normal rectal, no masses  Chaperone: Caryn Bee present during the examination  ASSESSMENT:  74 y.o. [id number] here for annual gynecologic exam  PLAN:   1. Postmenopausal.  Mild vasomotor symptoms returning. Does not want any prescription hormone therapy. Will plan to try OTC products such as soy or black cohosh.  Prior TAH. Prior anterior and posterior repair with graft and vault suspension.  Robotic BSO for a benign ovarian cyst in 2019.  No vaginal bleeding.    2. Small hemangiomas present on the vulva are benign and not cause for concern.  Recommend rechecking on annual exam. 3. Pap smear 2016.  No history of abnormal Pap smears.  Comfortable with not continue screening based on age and prior hysterectomy. 4. Mammogram 06/2018.  Normal breast exam today.  She is reminded to schedule an annual mammogram this year. 5. Colonoscopy 2017.  Recommended that she follow up at the recommended interval.   6.   Osteopenia.  DEXA 2020 T score -1.4 at distal third of radius.  Stable from prior DEXA.  Consider repeating in 2022.   7. Health maintenance.  No labs today as she normally has these completed with her primary care provider.   Return annually or sooner, prn.  Joseph Pierini MD  08/28/19

## 2019-08-30 ENCOUNTER — Telehealth: Payer: Self-pay

## 2019-08-30 DIAGNOSIS — Z23 Encounter for immunization: Secondary | ICD-10-CM | POA: Diagnosis not present

## 2019-08-30 NOTE — Telephone Encounter (Signed)
May continue to take pain meds as usual.

## 2019-08-30 NOTE — Telephone Encounter (Signed)
Patient wants to know if she can take her pain meds with the #2 covid vaccine.  She is scheduled to go at 3pm today  Please advise. Patient can be contacted at [phone]

## 2019-08-30 NOTE — Telephone Encounter (Signed)
Please advise if patient should hold off on taking any pain meds until after receiving 2nd covid vaccine.

## 2019-08-30 NOTE — Telephone Encounter (Signed)
Patient advised and voiced understanding.  

## 2019-09-04 ENCOUNTER — Other Ambulatory Visit: Payer: Self-pay | Admitting: Family Medicine

## 2019-09-04 ENCOUNTER — Ambulatory Visit: Payer: Medicare Other | Admitting: Family Medicine

## 2019-09-10 ENCOUNTER — Telehealth: Payer: Self-pay

## 2019-09-10 NOTE — Telephone Encounter (Signed)
Pt's last visit was 07/18/19, acute and next f/u appt 09/25/19. Please advise, thanks.

## 2019-09-10 NOTE — Telephone Encounter (Signed)
Patient wants to know if she can start taking her baby aspirin again daily.  Please advise. Patient can be called at [phone].

## 2019-09-11 NOTE — Telephone Encounter (Signed)
As we discussed at her most recent office visit, she does not have to take aspirin anymore AT ALL.Marland Kitchen

## 2019-09-11 NOTE — Telephone Encounter (Signed)
Patient advised of PCP recommendations.

## 2019-09-19 ENCOUNTER — Encounter: Payer: Self-pay | Admitting: Family Medicine

## 2019-09-24 ENCOUNTER — Other Ambulatory Visit: Payer: Self-pay

## 2019-09-25 ENCOUNTER — Ambulatory Visit (INDEPENDENT_AMBULATORY_CARE_PROVIDER_SITE_OTHER): Payer: Medicare Other | Admitting: Family Medicine

## 2019-09-25 ENCOUNTER — Encounter: Payer: Self-pay | Admitting: Family Medicine

## 2019-09-25 VITALS — BP 122/71 | HR 64 | Temp 98.2°F | Resp 16 | Ht 60.0 in | Wt 132.0 lb

## 2019-09-25 DIAGNOSIS — M542 Cervicalgia: Secondary | ICD-10-CM

## 2019-09-25 DIAGNOSIS — G894 Chronic pain syndrome: Secondary | ICD-10-CM

## 2019-09-25 DIAGNOSIS — M545 Low back pain, unspecified: Secondary | ICD-10-CM

## 2019-09-25 DIAGNOSIS — G8929 Other chronic pain: Secondary | ICD-10-CM | POA: Diagnosis not present

## 2019-09-25 DIAGNOSIS — R5382 Chronic fatigue, unspecified: Secondary | ICD-10-CM | POA: Diagnosis not present

## 2019-09-25 DIAGNOSIS — R6889 Other general symptoms and signs: Secondary | ICD-10-CM | POA: Diagnosis not present

## 2019-09-25 DIAGNOSIS — I10 Essential (primary) hypertension: Secondary | ICD-10-CM

## 2019-09-25 DIAGNOSIS — R42 Dizziness and giddiness: Secondary | ICD-10-CM | POA: Diagnosis not present

## 2019-09-25 DIAGNOSIS — F411 Generalized anxiety disorder: Secondary | ICD-10-CM | POA: Diagnosis not present

## 2019-09-25 LAB — COMPREHENSIVE METABOLIC PANEL
ALT: 17 U/L (ref 0–35)
AST: 23 U/L (ref 0–37)
Albumin: 4.3 g/dL (ref 3.5–5.2)
Alkaline Phosphatase: 74 U/L (ref 39–117)
BUN: 12 mg/dL (ref 6–23)
CO2: 25 mEq/L (ref 19–32)
Calcium: 9.3 mg/dL (ref 8.4–10.5)
Chloride: 100 mEq/L (ref 96–112)
Creatinine, Ser: 0.77 mg/dL (ref 0.40–1.20)
GFR: 73.08 mL/min (ref >60.00)
Glucose, Bld: 106 mg/dL — ABNORMAL HIGH (ref 70–99)
Potassium: 4.2 mEq/L (ref 3.5–5.1)
Sodium: 133 mEq/L — ABNORMAL LOW (ref 135–145)
Total Bilirubin: 0.8 mg/dL (ref 0.2–1.2)
Total Protein: 6.7 g/dL (ref 6.0–8.3)

## 2019-09-25 LAB — CBC WITH DIFFERENTIAL/PLATELET
Basophils Absolute: 0 10*3/uL (ref 0.0–0.1)
Basophils Relative: 0.7 % (ref 0.0–3.0)
Eosinophils Absolute: 0 10*3/uL (ref 0.0–0.7)
Eosinophils Relative: 0.7 % (ref 0.0–5.0)
HCT: 37.8 % (ref 36.0–46.0)
Hemoglobin: 13.1 g/dL (ref 12.0–15.0)
Lymphocytes Relative: 37 % (ref 12.0–46.0)
Lymphs Abs: 1.7 10*3/uL (ref 0.7–4.0)
MCHC: 34.7 g/dL (ref 30.0–36.0)
MCV: 89.7 fl (ref 78.0–100.0)
Monocytes Absolute: 0.3 10*3/uL (ref 0.1–1.0)
Monocytes Relative: 6.9 % (ref 3.0–12.0)
Neutro Abs: 2.5 10*3/uL (ref 1.4–7.7)
Neutrophils Relative %: 54.7 % (ref 43.0–77.0)
Platelets: 161 10*3/uL (ref 150.0–400.0)
RBC: 4.21 Mil/uL (ref 3.87–5.11)
RDW: 13.2 % (ref 11.5–15.5)
WBC: 4.6 10*3/uL (ref 4.0–10.5)

## 2019-09-25 LAB — TSH: TSH: 1.08 u[IU]/mL (ref 0.35–4.50)

## 2019-09-25 LAB — T4, FREE: Free T4: 0.95 ng/dL (ref 0.60–1.60)

## 2019-09-25 MED ORDER — HYDROCODONE-ACETAMINOPHEN 5-325 MG PO TABS: ORAL_TABLET | ORAL | 0 refills | Status: DC

## 2019-09-25 MED ORDER — VERAPAMIL HCL ER 180 MG PO TBCR: EXTENDED_RELEASE_TABLET | ORAL | 3 refills | Status: DC

## 2019-09-25 NOTE — Progress Notes (Signed)
OFFICE VISIT  09/25/2019   CC:  Chief Complaint  Patient presents with  . Follow-up    RCI, pt is not fasting                                                               HPI:    Patient is a 74 y.o. Caucasian female who presents for 4 mo f/u HTN, anxiety, chronic pain syndrome.  A/P as of last visit: "1) Chronic pain syndrome: neck DDD, lumbar DDD, right hip osteoarthritis. The current medical regimen is effective;  continue present plan and medications. No new rxs needed today.  2) GAD: The current medical regimen is effective;  continue present plan and medications. No new rx needed today. She does not want antidepressant trial.  3) HTN: The current medical regimen is effective;  continue present plan and medications. Lytes/cr normal 03/2019. Continue lisinopril and verapamil."  Interim hx: L lower leg hematoma improving approp. Having dizzy spells, describes spinning, not consistently triggered by head/body movement. Seems "now and then", over the last several months.  This has been a recurring prob for her, seems more problematic since chronic TM perf and subsequent surgery. Feeling cold all time, has been a chronic thing for her.  Wears a coat in the house/wears coat to bed. Others in same environment not cold.  No blood in stool or urine.  No vaginal bleeding, no melena. She just recently got put on amoxil for tooth infection. Has had RLS sxs' for years, most nights.  No daytime sx's.  Describes tingling and intense urge to move legs.  No muscle cramps.  Indication for chronic opioid:chronic low back and neck pain from DDD, not a candidate for further surgical intervention, failed ESI. Medication and dose:vicodin 5/325, 1 tid prn # pills per month: 90--this lasts approx 2-3 mo Last UDS date:04/03/19 Opioid Treatment Agreement signed (Y/N):Y, 04/03/19 Opioid Treatment Agreement last reviewed with patient:today Pain is stable : avg vicodin use is 1 and 1/2 tabs  qd.  Anxiety: has been stable using alprazolam 0.5mg  tid long term.  Mostly taking this at night. Taking occ daytime dose.  Lots of stress with taking care of her ill husband and worrying about him and everything.  Also struggling with her ear pain all the time.  HOME BPs usually 130s/80s.  Occ 150s when feeling bad from something.  PMP AWARE reviewed today: most recent rx for vicodin 5/325 was filled 06/14/19, # 41, rx by me. Most recent rx for alpraz 0.5mg  filled 09/15/19, #90, rx by me. No red flags.  ROS: no fevers, no CP, no SOB, no wheezing, no cough, no HAs, no rashes, no melena/hematochezia.  No polyuria or polydipsia.  No dysuria or vag d/c.  No myalgias or arthralgias.  No focal weakness, paresthesias, or tremors.  No acute vision or hearing abnormalities. No n/v/d or abd pain.  No palpitations.     Past Medical History:  Diagnosis Date  . Allergic rhinitis    maple pollen  . Anxiety   . Arthritis   . Bowel obstruction ([place])   . Chronic low back pain    Dr. Trenton Gammon.  Has had back injection--bp elevated after.  . Chronic pain syndrome   . Chronic renal insufficiency, stage 2 (mild)  GFR 60-70  . DDD (degenerative disc disease), cervical    Hx of ACDF (Dr. [name]).  Followed by Dr. [name].  Also, Dr. [name] do left C7-T1 intralaminal epidural injection.  . Diverticulosis of colon   . DJD (degenerative joint disease)   . Gallstones   . GERD (gastroesophageal reflux disease)   . Herpes zoster 07/08/2014  . Hypercholesteremia    mild; pt declined statin trial 05/2014--needs recheck lipid panel at first f/u visit in 2017  . Hypertension   . Osteopenia 06/2017   T score -1.4 FRAX 15% / 2%  . Ovarian cyst 2017   [id number]-robotic assisted bilatel SPO: all path benign.  . Perianal dermatitis    prn cutivate  . Phlebitis   . Right knee injury 2018   Patellofemoral crush injury--Dr. [name].  . Trochanteric bursitis of right hip    Recurrent (injection by  Dr. [name] 05/26/15)  . Tympanic membrane rupture, right 12/01/2016   As of 08/2018 pt set for tympanomastoidectomy and STSG (Dr. [name]). Recurrent fungal/bact OE.  . Varicose vein    left leg     Past Surgical History:  Procedure Laterality Date  . ABDOMINAL HYSTERECTOMY  03/2015   TAH.  Last pap 2016.  No hx of abnl paps.  Per GYN, no further pap smears indicated.  . ANTERIOR AND POSTERIOR REPAIR N/A 03/18/2014   Procedure: Cystocele repair with graft, Vault suspension, Rectocele repair;  Surgeon: [name], MD;  Location: [facility];  Service: Urology;  Laterality: N/A;  . CHOLECYSTECTOMY    . CHOLECYSTECTOMY, LAPAROSCOPIC    . COLON SURGERY    . COLONOSCOPY  04/2006; 05/26/16   2007 (Dr. [name]): Normal.  04/2016 (Dr. [name]) normal except diverticulosis and decreased anal sphincter tone.  No repeat colonoscopy is recommended due to age.  . cspine surgery     Dr. Wiliam Ke level ant cerv discectomy /fusion w/plating  . CYSTO N/A 03/18/2014   Procedure: CYSTO;  Surgeon: [name], MD;  Location: [facility];  Service: Urology;  Laterality: N/A;  . DEXA  07/30/2015; 06/2018   2017 and 2020 -->Osteopenia--repeat 2 yrs.  Marland Kitchen LYSIS OF ADHESION N/A 01/31/2018   Procedure: POSSIBLE LYSIS OF ADHESION;  Surgeon: Everitt Amber, MD;  Location: [facility];  Service: Gynecology;  Laterality: N/A;  . [place]     with prolasped bledder repair  . RESECTION OF COLON     BENIGN TUMOR  . RIGHT EAR SURGERY  08/23/2017   Dr. 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 right ear surgery  12/12/2018   Right tympanomastoidectomy, ossiculoplasty with partial prosthesis, split thickness skin graft from postauricular skin 1x1cm, and right tragal cartilage graft 1x1 cm [facility])  . right hemicolectomy for diverticulitis with abscess  1993  . ROBOTIC ASSISTED  BILATERAL SALPINGO OOPHERECTOMY Bilateral 01/31/2018   All PATH benign.  Procedure: XI ROBOTIC ASSISTED BILATERAL SALPINGO OOPHORECTOMY;  Surgeon: Everitt Amber, MD;  Location: [facility];  Service: Gynecology;  Laterality: Bilateral;  . TUBAL LIGATION      Outpatient Medications Prior to Visit  Medication Sig Dispense Refill  . ALPRAZolam (XANAX) 0.5 MG tablet TAKE 1 TABLET BY MOUTH THREE TIMES A DAY AS NEEDED FOR ANXIETY 90 tablet 5  . amoxicillin (AMOXIL) 500 MG tablet Take 500 mg by mouth every 8 (eight) hours.    Marland Kitchen ascorbic acid (VITAMIN C) 1000  MG tablet Take by mouth.    . betamethasone valerate ointment (VALISONE) 0.1 % APPLY A SMALL AMOUNT TO AFFECTED AREA TWICE A DAY    . Biotin 10000 MCG TABS Take 10,000 mcg by mouth daily.     Marland Kitchen BLACK COHOSH PO Take by mouth daily.    . calcium carbonate (OSCAL) 1500 (600 Ca) MG TABS tablet Take 600 mg of elemental calcium by mouth daily with breakfast.    . Carboxymethylcellul-Glycerin (LUBRICATING EYE DROPS OP) Place 1 drop into both eyes daily as needed (dry eyes).    . Cholecalciferol (VITAMIN D3) 5000 units CAPS Take 5,000 Units by mouth daily.    . fluticasone (FLONASE) 50 MCG/ACT nasal spray Place 2 sprays into both nostrils at bedtime. 48 g 3  . HYDROcodone-acetaminophen (NORCO/VICODIN) 5-325 MG tablet 1 tab po tid prn pain 90 tablet 0  . Lactobacillus-Inulin (CULTURELLE DIGESTIVE HEALTH PO) Take 1 tablet by mouth daily as needed (upset stomach).     . lisinopril (ZESTRIL) 10 MG tablet TAKE 1 TABLET BY MOUTH EVERY DAY 90 tablet 1  . loratadine (CLARITIN) 10 MG tablet Take 10 mg by mouth every other day.     . meloxicam (MOBIC) 15 MG tablet TAKE 1 TABLET BY MOUTH EVERY DAY WITH FOOD AS NEEDED FOR MUSCULOSKELETAL PAIN 30 tablet 6  . Multiple Minerals-Vitamins (CALCIUM-MAGNESIUM-ZINC-D3 PO) Take by mouth. Pt takes 1 daily.    Marland Kitchen POTASSIUM PO Take 1 tablet by mouth daily.     Marland Kitchen PREVIDENT 5000 BOOSTER PLUS 1.1 % PSTE BRUSH TEETH 2 TO 3 TIMES A DAY.  AFTER USE SPIT OUT, BUT DO NOT RINSE.  6  . Simethicone (GAS-X PO) Take 1 tablet by mouth daily as needed (gas).    . triamcinolone ointment (KENALOG) 0.1 % Apply 1 application topically 2 (two) times daily. 30 g 1  . verapamil (CALAN-SR) 180 MG CR tablet TAKE 1 TABLET BY MOUTH EVERYDAY AT BEDTIME 30 tablet 0  . vitamin B-12 (CYANOCOBALAMIN) 1000 MCG tablet Take 2,000 mcg by mouth daily.    Marland Kitchen CIPRODEX OTIC suspension Place 4-5 drops into the right ear 2 (two) times daily.   0  . estradiol (ESTRACE VAGINAL) 0.1 MG/GM vaginal cream Apply 1/4 applicator 3 times weekly vaginally. (Patient not taking: Reported on 09/25/2019) 42.5 g 2  . famotidine (PEPCID) 40 MG tablet TAKE 1 TABLET BY MOUTH TWICE A DAY (Patient not taking: Reported on 09/25/2019) 180 tablet 1  . fluticasone (CUTIVATE) 0.05 % cream Apply to affected area bid prn (Patient not taking: Reported on 09/25/2019) 30 g 1  . meclizine (ANTIVERT) 12.5 MG tablet 1-2 tabs po tid prn (Patient not taking: Reported on 08/28/2019) 60 tablet 1  . Multiple Vitamins-Minerals (MULTIVITAMIN ADULT) TABS Take by mouth daily.    Marland Kitchen ofloxacin (FLOXIN) 0.3 % OTIC solution Place 5 drops into the right ear 2 (two) times daily. For up to 7 days for ear pain or drainage.  0  . ondansetron (ZOFRAN) 4 MG tablet Take 1 tablet (4 mg total) by mouth every 8 (eight) hours as needed for nausea or vomiting. (Patient not taking: Reported on 09/25/2019) 30 tablet 1  . POWDERS EX Use one puff in affected ear(s) daily or as directed by MD    . Drusilla Kanner EXTRACT PO Take 1 tablet by mouth daily.      No facility-administered medications prior to visit.    Allergies  Allergen Reactions  . Gabapentin Other (See Comments)    Elevated heart rate  and crazy dreams  . Prednisone     REACTION: funny feeling    ROS As per HPI  PE: Blood pressure 122/71, pulse 64, temperature 98.2 F (36.8 C), temperature source Temporal, resp. rate 16, height 5' (1.524 m), weight 132 lb (59.9 kg),  last menstrual period 06/27/1972, SpO2 97 %. Body mass index is 25.78 kg/m.  Gen: Alert, well appearing.  Patient is oriented to person, place, time, and situation. AFFECT: pleasant, lucid thought and speech. ENT: Ears: EACs clear, normal epithelium.  L TM with good light reflex and landmarks.  R TM with perf/surgical changes.  No drainage/exudate.  Eyes: no injection, icteris, swelling, or exudate.  EOMI, PERRLA. Nose: no drainage or turbinate edema/swelling.  No injection or focal lesion.  Mouth: lips without lesion/swelling.  Oral mucosa pink and moist.  Dentition intact and without obvious caries or gingival swelling.  Oropharynx without erythema, exudate, or swelling.  Neck: no thyromegaly or nodule/mass. CV: RRR, no m/r/g.   LUNGS: CTA bilat, nonlabored resps, good aeration in all lung fields. EXT: no clubbing or cyanosis.  no edema.  L LL anteriorly with diffuse ecchymoses c/w healing/resolving hematoma. SKIN: no pallor or jaundice    LABS:  Lab Results  Component Value Date   TSH 1.69 04/03/2019   Lab Results  Component Value Date   WBC 6.4 04/03/2019   HGB 13.2 04/03/2019   HCT 39.5 04/03/2019   MCV 90.3 04/03/2019   PLT 176.0 04/03/2019   Lab Results  Component Value Date   CREATININE 0.86 04/03/2019   CREATININE 0.86 04/03/2019   BUN 16 04/03/2019   BUN 16 04/03/2019   NA 134 (L) 04/03/2019   NA 134 (L) 04/03/2019   K 4.4 04/03/2019   K 4.4 04/03/2019   CL 99 04/03/2019   CL 99 04/03/2019   CO2 27 04/03/2019   CO2 27 04/03/2019   Lab Results  Component Value Date   ALT 17 04/03/2019   AST 22 04/03/2019   ALKPHOS 75 04/03/2019   BILITOT 0.6 04/03/2019   Lab Results  Component Value Date   CHOL 165 03/22/2018   Lab Results  Component Value Date   HDL 60.90 03/22/2018   Lab Results  Component Value Date   LDLCALC 89 03/22/2018   Lab Results  Component Value Date   TRIG 75.0 03/22/2018   Lab Results  Component Value Date   CHOLHDL 3  03/22/2018   Lab Results  Component Value Date   VITAMINB12 >2,000 (H) 12/19/2017   Lab Results  Component Value Date   HGBA1C 4.9 01/30/2017    IMPRESSION AND PLAN:  1) Chronic pain syndrome: DDD of C and L spine. Opioids rx'd to improve functioning and quality of life. She uses vicodin sparingly.  CSC and UDS UTD. Vicodin 5/325, Not on ASA 1 tid prn, #90 eRx'd today.    2) GAD: lots of stressors keep this in chronic poor control but she does not want any changes in meds. No new rx for alprazolam was done today.  3) Cold intolerance and chronic fatigue: long term but bothering her worse lately. Monitor thyroid panel, CBC, CMET today.  4) Vertigo, also some disequilibrium syndrome that is non-vertiginous. Chronic/recurrent.  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 Reassured.  She will continue prn meclizine.  5) RLS: long term problems, says she will just continue to deal with it rather than try switching her alprazolam to clonazepam.  6) HTN: well controlled. Lyts/cr today.  An After Visit Summary was printed and given to the patient.  FOLLOW UP: No follow-ups on file.  Signed:  Crissie Sickles, MD           09/25/2019

## 2019-09-26 LAB — T3: T3, Total: 111 ng/dL (ref 76–181)

## 2019-10-10 ENCOUNTER — Ambulatory Visit: Payer: Medicare Other | Admitting: Family Medicine

## 2019-11-26 DIAGNOSIS — H903 Sensorineural hearing loss, bilateral: Secondary | ICD-10-CM | POA: Diagnosis not present

## 2019-11-26 DIAGNOSIS — Z8739 Personal history of other diseases of the musculoskeletal system and connective tissue: Secondary | ICD-10-CM | POA: Diagnosis not present

## 2019-11-26 DIAGNOSIS — Z9889 Other specified postprocedural states: Secondary | ICD-10-CM | POA: Diagnosis not present

## 2019-11-26 DIAGNOSIS — H7291 Unspecified perforation of tympanic membrane, right ear: Secondary | ICD-10-CM | POA: Diagnosis not present

## 2019-12-26 DIAGNOSIS — H7291 Unspecified perforation of tympanic membrane, right ear: Secondary | ICD-10-CM | POA: Diagnosis not present

## 2019-12-26 DIAGNOSIS — H903 Sensorineural hearing loss, bilateral: Secondary | ICD-10-CM | POA: Diagnosis not present

## 2019-12-26 DIAGNOSIS — L299 Pruritus, unspecified: Secondary | ICD-10-CM | POA: Diagnosis not present

## 2019-12-26 DIAGNOSIS — Z9889 Other specified postprocedural states: Secondary | ICD-10-CM | POA: Diagnosis not present

## 2020-01-01 ENCOUNTER — Other Ambulatory Visit: Payer: Self-pay

## 2020-01-01 ENCOUNTER — Ambulatory Visit (INDEPENDENT_AMBULATORY_CARE_PROVIDER_SITE_OTHER): Payer: Medicare Other | Admitting: Family Medicine

## 2020-01-01 ENCOUNTER — Encounter: Payer: Self-pay | Admitting: Family Medicine

## 2020-01-01 VITALS — BP 134/79 | HR 60 | Temp 98.9°F | Resp 16 | Ht 60.0 in | Wt 137.6 lb

## 2020-01-01 DIAGNOSIS — G894 Chronic pain syndrome: Secondary | ICD-10-CM | POA: Diagnosis not present

## 2020-01-01 DIAGNOSIS — R197 Diarrhea, unspecified: Secondary | ICD-10-CM | POA: Diagnosis not present

## 2020-01-01 DIAGNOSIS — M542 Cervicalgia: Secondary | ICD-10-CM | POA: Diagnosis not present

## 2020-01-01 DIAGNOSIS — F411 Generalized anxiety disorder: Secondary | ICD-10-CM | POA: Diagnosis not present

## 2020-01-01 DIAGNOSIS — I10 Essential (primary) hypertension: Secondary | ICD-10-CM | POA: Diagnosis not present

## 2020-01-01 DIAGNOSIS — M5030 Other cervical disc degeneration, unspecified cervical region: Secondary | ICD-10-CM

## 2020-01-01 NOTE — Progress Notes (Signed)
OFFICE VISIT  01/01/2020   CC:  Chief Complaint  Patient presents with  . Follow-up    RCI, pt is not fasting   HPI:    Patient is a 73 y.o. Caucasian female who presents for 3 mo f/u HTN, anxiety, chronic pain syndrome. A/P as of last visit: ") Chronic pain syndrome: DDD of C and L spine. Opioids rx'd to improve functioning and quality of life. She uses vicodin sparingly.  CSC and UDS UTD. Vicodin 5/325, Not on ASA 1 tid prn, #90 eRx'd today.    2) GAD: lots of stressors keep this in chronic poor control but she does not want any changes in meds. No new rx for alprazolam was done today.  3) Cold intolerance and chronic fatigue: long term but bothering her worse lately. Monitor thyroid panel, CBC, CMET today.  4) Vertigo, also some disequilibrium syndrome that is non-vertiginous. Chronic/recurrent.  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 Reassured.  She will continue prn meclizine.  5) RLS: long term problems, says she will just continue to deal with it rather than try switching her alprazolam to clonazepam.  6) HTN: well controlled.  Lytes/cr today."  INTERIM HX: ALL LABS NORMAL LAST VISIT. Last 24 hrs has had some frequent diarrhea/upset stomach. No n/v.  No signif abd pain.  No blood or pus in stool. No fevers.  Stomach feeling a little better today. She thinks it was something she ate.  No one around her with similar sx's. No urinary urgency, frequency, or dysuria.    Indication for chronic opioid:chronic low back and neck pain from DDD, not a candidate for further surgical intervention, failed ESI. Medication and dose:vicodin 5/325, 1 tid prn # pills per month: 90--this lasts approx 2-3 mo Last UDS date:04/03/19 Opioid Treatment Agreement signed (Y/N):Y, 04/03/19 PMP AWARE reviewed today: most recent rx for vicodin 5/325 was filled 10/24/19, # 70, rx by me.  Most recent rx for alpraz 0.5mg   filled 09/15/19, #90, rx by me. No red flags.  Pain control moderately adequate. Typical use of vicodin is 2 tabs per day.  Takes meloxicam qAM.  HTN: home bp's normal for the most part except was up a little yesterday when she was having lots of diarrhea.  ANXIETY: chronic stress with taking care of chronically ill husband. Her chronic ear problem/pain causes her lots of anxiety. Taking alpraz prn, not regularly. Her pain and life situation get her feeling down but she says she is not depressed and always declines any suggestion of antidepressant trial.  ROS: no fevers, no CP, no SOB, no wheezing, no cough, no rashes, no melena/hematochezia.  No polyuria or polydipsia.  No myalgias or arthralgias.  No focal weakness, paresthesias, or tremors.  No acute vision or hearing abnormalities. No palpitations.    Stays stopped up in nose a lot. Having some HAs in forehead lately. Using flonase qhs. Bilat ear canal itching lately, was put on ear drops for eczema in ears about a week ago.     Past Medical History:  Diagnosis Date  . Allergic rhinitis    maple pollen  . Anxiety   . Arthritis   . Bowel obstruction ([place])   . Chronic low back pain    Dr. Trenton Gammon.  Has had back injection--bp elevated after.  . Chronic pain syndrome   . Chronic renal insufficiency, stage 2 (mild)    GFR 60-70  . DDD (degenerative disc disease),  cervical    Hx of ACDF (Dr. [name]).  Followed by Dr. [name].  Also, Dr. [name] do left C7-T1 intralaminal epidural injection.  . Diverticulosis of colon   . DJD (degenerative joint disease)   . Gallstones   . GERD (gastroesophageal reflux disease)   . Herpes zoster 07/08/2014  . Hypercholesteremia    mild; pt declined statin trial 05/2014--needs recheck lipid panel at first f/u visit in 2017  . Hypertension   . Osteopenia 06/2017   T score -1.4 FRAX 15% / 2%  . Ovarian cyst 2017   82019-robotic assisted bilatel SPO: all path benign.  .  Perianal dermatitis    prn cutivate  . Phlebitis   . Right knee injury 2018   Patellofemoral crush injury--Dr. [name].  . Trochanteric bursitis of right hip    Recurrent (injection by Dr. [name] 05/26/15)  . Tympanic membrane rupture, right 12/01/2016   As of 08/2018 pt set for tympanomastoidectomy and STSG (Dr. [name]). Recurrent fungal/bact OE.  . Varicose vein    left leg     Past Surgical History:  Procedure Laterality Date  . ABDOMINAL HYSTERECTOMY  03/2015   TAH.  Last pap 2016.  No hx of abnl paps.  Per GYN, no further pap smears indicated.  . ANTERIOR AND POSTERIOR REPAIR N/A 03/18/2014   Procedure: Cystocele repair with graft, Vault suspension, Rectocele repair;  Surgeon: [name], MD;  Location: [facility];  Service: Urology;  Laterality: N/A;  . CHOLECYSTECTOMY    . CHOLECYSTECTOMY, LAPAROSCOPIC    . COLON SURGERY    . COLONOSCOPY  04/2006; 05/26/16   2007 (Dr. [name]): Normal.  04/2016 (Dr. [name]) normal except diverticulosis and decreased anal sphincter tone.  No repeat colonoscopy is recommended due to age.  . cspine surgery     Dr. Wiliam Ke level ant cerv discectomy /fusion w/plating  . CYSTO N/A 03/18/2014   Procedure: CYSTO;  Surgeon: [name], MD;  Location: [facility];  Service: Urology;  Laterality: N/A;  . DEXA  07/30/2015; 06/2018   2017 and 2020 -->Osteopenia--repeat 2 yrs.  Marland Kitchen LYSIS OF ADHESION N/A 01/31/2018   Procedure: POSSIBLE LYSIS OF ADHESION;  Surgeon: Everitt Amber, MD;  Location: [facility];  Service: Gynecology;  Laterality: N/A;  . [place]     with prolasped bledder repair  . RESECTION OF COLON     BENIGN TUMOR  . RIGHT EAR SURGERY  08/23/2017   Dr. 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 right ear surgery  12/12/2018   Right tympanomastoidectomy, ossiculoplasty with partial prosthesis, split  thickness skin graft from postauricular skin 1x1cm, and right tragal cartilage graft 1x1 cm [facility])  . right hemicolectomy for diverticulitis with abscess  1993  . ROBOTIC ASSISTED BILATERAL SALPINGO OOPHERECTOMY Bilateral 01/31/2018   All PATH benign.  Procedure: XI ROBOTIC ASSISTED BILATERAL SALPINGO OOPHORECTOMY;  Surgeon: Everitt Amber, MD;  Location: [facility];  Service: Gynecology;  Laterality: Bilateral;  . TUBAL LIGATION      Outpatient Medications Prior to Visit  Medication Sig Dispense Refill  . ALPRAZolam (XANAX) 0.5 MG tablet TAKE 1 TABLET BY MOUTH THREE TIMES A DAY AS NEEDED FOR ANXIETY 90 tablet 5  . ascorbic acid (VITAMIN C) 1000 MG tablet Take by mouth.    . ASPIRIN 81 PO Take by mouth as needed.    . betamethasone valerate ointment (VALISONE) 0.1 %  APPLY A SMALL AMOUNT TO AFFECTED AREA TWICE A DAY    . Biotin 10000 MCG TABS Take 10,000 mcg by mouth daily.     . calcium carbonate (OSCAL) 1500 (600 Ca) MG TABS tablet Take 600 mg of elemental calcium by mouth daily with breakfast.    . Carboxymethylcellul-Glycerin (LUBRICATING EYE DROPS OP) Place 1 drop into both eyes daily as needed (dry eyes).    . Cholecalciferol (VITAMIN D3) 5000 units CAPS Take 5,000 Units by mouth daily.    Marland Kitchen CIPRODEX OTIC suspension Place 3 drops into the right ear 2 (two) times daily.   0  . fluticasone (FLONASE) 50 MCG/ACT nasal spray Place 2 sprays into both nostrils at bedtime. 48 g 3  . HYDROcodone-acetaminophen (NORCO/VICODIN) 5-325 MG tablet 1 tab po tid prn pain 90 tablet 0  . Lactobacillus-Inulin (CULTURELLE DIGESTIVE HEALTH PO) Take 1 tablet by mouth daily as needed (upset stomach).     . lisinopril (ZESTRIL) 10 MG tablet TAKE 1 TABLET BY MOUTH EVERY DAY 90 tablet 1  . loratadine (CLARITIN) 10 MG tablet Take 10 mg by mouth every other day.     . meloxicam (MOBIC) 15 MG tablet TAKE 1 TABLET BY MOUTH EVERY DAY WITH FOOD AS NEEDED FOR MUSCULOSKELETAL PAIN 30 tablet 6  . Multiple Minerals-Vitamins  (CALCIUM-MAGNESIUM-ZINC-D3 PO) Take by mouth. Pt takes 1 daily.    . Multiple Vitamins-Minerals (MULTIVITAMIN ADULT) TABS Take by mouth daily.    Marland Kitchen POTASSIUM PO Take 1 tablet by mouth daily.     Marland Kitchen PREVIDENT 5000 BOOSTER PLUS 1.1 % PSTE BRUSH TEETH 2 TO 3 TIMES A DAY. AFTER USE SPIT OUT, BUT DO NOT RINSE.  6  . Simethicone (GAS-X PO) Take 1 tablet by mouth daily as needed (gas).    . verapamil (CALAN-SR) 180 MG CR tablet 1 tab po qd 90 tablet 3  . vitamin B-12 (CYANOCOBALAMIN) 1000 MCG tablet Take 2,000 mcg by mouth daily.    Marland Kitchen BLACK COHOSH PO Take by mouth daily. (Patient not taking: Reported on 01/01/2020)    . estradiol (ESTRACE VAGINAL) 0.1 MG/GM vaginal cream Apply 1/4 applicator 3 times weekly vaginally. (Patient not taking: Reported on 09/25/2019) 42.5 g 2  . famotidine (PEPCID) 40 MG tablet TAKE 1 TABLET BY MOUTH TWICE A DAY (Patient not taking: Reported on 09/25/2019) 180 tablet 1  . fluticasone (CUTIVATE) 0.05 % cream Apply to affected area bid prn (Patient not taking: Reported on 09/25/2019) 30 g 1  . meclizine (ANTIVERT) 12.5 MG tablet 1-2 tabs po tid prn (Patient not taking: Reported on 08/28/2019) 60 tablet 1  . ofloxacin (FLOXIN) 0.3 % OTIC solution Place 5 drops into the right ear 2 (two) times daily. For up to 7 days for ear pain or drainage. (Patient not taking: Reported on 01/01/2020)  0  . ondansetron (ZOFRAN) 4 MG tablet Take 1 tablet (4 mg total) by mouth every 8 (eight) hours as needed for nausea or vomiting. (Patient not taking: Reported on 09/25/2019) 30 tablet 1  . POWDERS EX Use one puff in affected ear(s) daily or as directed by MD (Patient not taking: Reported on 01/01/2020)    . triamcinolone ointment (KENALOG) 0.1 % Apply 1 application topically 2 (two) times daily. (Patient not taking: Reported on 01/01/2020) 30 g 1  . amoxicillin (AMOXIL) 500 MG tablet Take 500 mg by mouth every 8 (eight) hours. (Patient not taking: Reported on 01/01/2020)     No facility-administered medications  prior to visit.  Allergies  Allergen Reactions  . Gabapentin Other (See Comments)    Elevated heart rate and crazy dreams  . Prednisone     REACTION: funny feeling    ROS As per HPI  PE: Vitals with BMI 01/01/2020 09/25/2019 08/28/2019  Height 5\' 0"  5\' 0"  5\' 0"   Weight 137 lbs 10 oz 132 lbs 136 lbs  BMI 26.87 20.94 70.96  Systolic 283 662 947  Diastolic 79 71 76  Pulse 60 64 -  O2 sat today on RA is 95%  Gen: Alert, well appearing.  Patient is oriented to person, place, time, and situation. AFFECT: pleasant, lucid thought and speech. MLY:YTKP: no injection, icteris, swelling, or exudate.  EOMI, PERRLA. R EAC with some erythema and flaking/crusting.  No drainage or swelling.  TM with chronic perf, no pus.  L EAC normal, TM normal. Mild discomfort on palpation of sinuses.  No nasal obstruction/mucous. Mouth: lips without lesion/swelling.  Oral mucosa pink and moist. Oropharynx without erythema, exudate, or swelling.  CV: RRR, no m/r/g.   LUNGS: CTA bilat, nonlabored resps, good aeration in all lung fields. EXT: no clubbing or cyanosis.  no edema.    LABS:    Chemistry      Component Value Date/Time   NA 133 (L) 09/25/2019 1133   K 4.2 09/25/2019 1133   CL 100 09/25/2019 1133   CO2 25 09/25/2019 1133   BUN 12 09/25/2019 1133   CREATININE 0.77 09/25/2019 1133   CREATININE 0.82 12/19/2017 1207      Component Value Date/Time   CALCIUM 9.3 09/25/2019 1133   ALKPHOS 74 09/25/2019 1133   AST 23 09/25/2019 1133   ALT 17 09/25/2019 1133   BILITOT 0.8 09/25/2019 1133     Lab Results  Component Value Date   WBC 4.6 09/25/2019   HGB 13.1 09/25/2019   HCT 37.8 09/25/2019   MCV 89.7 09/25/2019   PLT 161.0 09/25/2019   Lab Results  Component Value Date   TSH 1.08 09/25/2019   Lab Results  Component Value Date   HGBA1C 4.9 01/30/2017   Lab Results  Component Value Date   CHOL 165 03/22/2018   HDL 60.90 03/22/2018   LDLCALC 89 03/22/2018   LDLDIRECT 136.9  09/20/2007   TRIG 75.0 03/22/2018   CHOLHDL 3 03/22/2018     IMPRESSION AND PLAN:  1) Chronic pain syndrome: cervicalgia from DDD. Stable, using vicodin infrequently. No new rx needed today. CSC and UDS UTD.  2) HTN: The current medical regimen is effective;  continue present plan and medications. Lytes/cr normal 3 mo ago and have been stable long term. No labs today.  3) GAD: some low level/mild depression but she declines antidepressant trial repeatedly. Uses alprazolam sparingly.  Encouragement given today.  4) Acute diarrhea (24h now): seems to be letting up. I told her if this continues or worsens then we'll need to get a stool sample.  5) Chronic allergic rhinitis, hx of recurrent sinusitis. I don't think she has a sinus infection today. No new meds.  6) R>L EAC eczema: on ciprodex otic from her ENT currently.  7) Nocturnal muscle cramps: encouraged adequate hydration, suggested trial of 3 oz tonic water and otc mag oxide 500mg  qhs.  An After Visit Summary was printed and given to the patient.  FOLLOW UP: Return in about 3 months (around 04/02/2020) for routine chronic illness f/u.  Signed:  Crissie Sickles, MD           01/01/2020

## 2020-01-10 ENCOUNTER — Other Ambulatory Visit: Payer: Self-pay | Admitting: Family Medicine

## 2020-01-10 NOTE — Telephone Encounter (Signed)
Requesting: alprazolam Contract:03/06/19 UDS:04/03/19 Last Visit:01/01/20 Next Visit:04/01/20 Last Refill:04/05/19(90,5)  RF request for verapamil LOV:7/721 Next ov: 04/01/20 Last written:09/25/19(90,3)  Medications pending, please advise thanks.

## 2020-01-12 ENCOUNTER — Other Ambulatory Visit: Payer: Self-pay | Admitting: Family Medicine

## 2020-01-26 ENCOUNTER — Other Ambulatory Visit: Payer: Self-pay

## 2020-01-26 ENCOUNTER — Encounter (HOSPITAL_COMMUNITY): Payer: Self-pay | Admitting: Emergency Medicine

## 2020-01-26 ENCOUNTER — Inpatient Hospital Stay (HOSPITAL_COMMUNITY)
Admission: EM | Admit: 2020-01-26 | Discharge: 2020-01-29 | DRG: 064 | Disposition: A | Discharge status: Rehabilitation Facility | Payer: Medicare Other | Attending: Neurology | Admitting: Neurology

## 2020-01-26 ENCOUNTER — Emergency Department (HOSPITAL_COMMUNITY): Payer: Medicare Other

## 2020-01-26 DIAGNOSIS — H6061 Unspecified chronic otitis externa, right ear: Secondary | ICD-10-CM | POA: Diagnosis not present

## 2020-01-26 DIAGNOSIS — F411 Generalized anxiety disorder: Secondary | ICD-10-CM | POA: Diagnosis present

## 2020-01-26 DIAGNOSIS — Z9851 Tubal ligation status: Secondary | ICD-10-CM

## 2020-01-26 DIAGNOSIS — M545 Low back pain, unspecified: Secondary | ICD-10-CM | POA: Diagnosis present

## 2020-01-26 DIAGNOSIS — I69351 Hemiplegia and hemiparesis following cerebral infarction affecting right dominant side: Secondary | ICD-10-CM | POA: Diagnosis not present

## 2020-01-26 DIAGNOSIS — R58 Hemorrhage, not elsewhere classified: Secondary | ICD-10-CM | POA: Diagnosis not present

## 2020-01-26 DIAGNOSIS — R410 Disorientation, unspecified: Secondary | ICD-10-CM

## 2020-01-26 DIAGNOSIS — I161 Hypertensive emergency: Secondary | ICD-10-CM | POA: Diagnosis not present

## 2020-01-26 DIAGNOSIS — Z90710 Acquired absence of both cervix and uterus: Secondary | ICD-10-CM | POA: Diagnosis not present

## 2020-01-26 DIAGNOSIS — H9011 Conductive hearing loss, unilateral, right ear, with unrestricted hearing on the contralateral side: Secondary | ICD-10-CM | POA: Diagnosis not present

## 2020-01-26 DIAGNOSIS — I619 Nontraumatic intracerebral hemorrhage, unspecified: Secondary | ICD-10-CM | POA: Diagnosis not present

## 2020-01-26 DIAGNOSIS — G894 Chronic pain syndrome: Secondary | ICD-10-CM | POA: Diagnosis not present

## 2020-01-26 DIAGNOSIS — I6522 Occlusion and stenosis of left carotid artery: Secondary | ICD-10-CM | POA: Diagnosis not present

## 2020-01-26 DIAGNOSIS — G8929 Other chronic pain: Secondary | ICD-10-CM | POA: Diagnosis present

## 2020-01-26 DIAGNOSIS — R471 Dysarthria and anarthria: Secondary | ICD-10-CM | POA: Diagnosis not present

## 2020-01-26 DIAGNOSIS — I10 Essential (primary) hypertension: Secondary | ICD-10-CM | POA: Diagnosis not present

## 2020-01-26 DIAGNOSIS — Z20822 Contact with and (suspected) exposure to covid-19: Secondary | ICD-10-CM | POA: Diagnosis not present

## 2020-01-26 DIAGNOSIS — I639 Cerebral infarction, unspecified: Secondary | ICD-10-CM | POA: Diagnosis not present

## 2020-01-26 DIAGNOSIS — Z79899 Other long term (current) drug therapy: Secondary | ICD-10-CM

## 2020-01-26 DIAGNOSIS — I611 Nontraumatic intracerebral hemorrhage in hemisphere, cortical: Secondary | ICD-10-CM | POA: Diagnosis not present

## 2020-01-26 DIAGNOSIS — R29810 Facial weakness: Secondary | ICD-10-CM | POA: Diagnosis not present

## 2020-01-26 DIAGNOSIS — G936 Cerebral edema: Secondary | ICD-10-CM | POA: Diagnosis not present

## 2020-01-26 DIAGNOSIS — Z888 Allergy status to other drugs, medicaments and biological substances status: Secondary | ICD-10-CM

## 2020-01-26 DIAGNOSIS — M1990 Unspecified osteoarthritis, unspecified site: Secondary | ICD-10-CM | POA: Diagnosis present

## 2020-01-26 DIAGNOSIS — E876 Hypokalemia: Secondary | ICD-10-CM | POA: Diagnosis not present

## 2020-01-26 DIAGNOSIS — R29712 NIHSS score 12: Secondary | ICD-10-CM | POA: Diagnosis not present

## 2020-01-26 DIAGNOSIS — M8580 Other specified disorders of bone density and structure, unspecified site: Secondary | ICD-10-CM | POA: Diagnosis not present

## 2020-01-26 DIAGNOSIS — I129 Hypertensive chronic kidney disease with stage 1 through stage 4 chronic kidney disease, or unspecified chronic kidney disease: Secondary | ICD-10-CM | POA: Diagnosis not present

## 2020-01-26 DIAGNOSIS — I6930 Unspecified sequelae of cerebral infarction: Secondary | ICD-10-CM

## 2020-01-26 DIAGNOSIS — K219 Gastro-esophageal reflux disease without esophagitis: Secondary | ICD-10-CM | POA: Diagnosis not present

## 2020-01-26 DIAGNOSIS — Z9049 Acquired absence of other specified parts of digestive tract: Secondary | ICD-10-CM | POA: Diagnosis not present

## 2020-01-26 DIAGNOSIS — I6389 Other cerebral infarction: Secondary | ICD-10-CM | POA: Diagnosis not present

## 2020-01-26 DIAGNOSIS — S06360A Traumatic hemorrhage of cerebrum, unspecified, without loss of consciousness, initial encounter: Secondary | ICD-10-CM | POA: Diagnosis not present

## 2020-01-26 DIAGNOSIS — M79604 Pain in right leg: Secondary | ICD-10-CM | POA: Diagnosis not present

## 2020-01-26 DIAGNOSIS — I6200 Nontraumatic subdural hemorrhage, unspecified: Secondary | ICD-10-CM | POA: Diagnosis not present

## 2020-01-26 DIAGNOSIS — R9431 Abnormal electrocardiogram [ECG] [EKG]: Secondary | ICD-10-CM | POA: Diagnosis not present

## 2020-01-26 DIAGNOSIS — T50905A Adverse effect of unspecified drugs, medicaments and biological substances, initial encounter: Secondary | ICD-10-CM | POA: Diagnosis not present

## 2020-01-26 DIAGNOSIS — R450 Nervousness: Secondary | ICD-10-CM | POA: Diagnosis not present

## 2020-01-26 DIAGNOSIS — M5030 Other cervical disc degeneration, unspecified cervical region: Secondary | ICD-10-CM | POA: Diagnosis present

## 2020-01-26 DIAGNOSIS — Z7982 Long term (current) use of aspirin: Secondary | ICD-10-CM | POA: Diagnosis not present

## 2020-01-26 DIAGNOSIS — Z8249 Family history of ischemic heart disease and other diseases of the circulatory system: Secondary | ICD-10-CM

## 2020-01-26 DIAGNOSIS — H6093 Unspecified otitis externa, bilateral: Secondary | ICD-10-CM | POA: Diagnosis not present

## 2020-01-26 DIAGNOSIS — N182 Chronic kidney disease, stage 2 (mild): Secondary | ICD-10-CM | POA: Diagnosis present

## 2020-01-26 DIAGNOSIS — N390 Urinary tract infection, site not specified: Secondary | ICD-10-CM | POA: Diagnosis not present

## 2020-01-26 DIAGNOSIS — I610 Nontraumatic intracerebral hemorrhage in hemisphere, subcortical: Secondary | ICD-10-CM | POA: Diagnosis not present

## 2020-01-26 DIAGNOSIS — H60311 Diffuse otitis externa, right ear: Secondary | ICD-10-CM | POA: Diagnosis not present

## 2020-01-26 DIAGNOSIS — R569 Unspecified convulsions: Secondary | ICD-10-CM | POA: Diagnosis not present

## 2020-01-26 DIAGNOSIS — K921 Melena: Secondary | ICD-10-CM | POA: Diagnosis not present

## 2020-01-26 DIAGNOSIS — M797 Fibromyalgia: Secondary | ICD-10-CM | POA: Diagnosis present

## 2020-01-26 DIAGNOSIS — G8191 Hemiplegia, unspecified affecting right dominant side: Secondary | ICD-10-CM

## 2020-01-26 DIAGNOSIS — I612 Nontraumatic intracerebral hemorrhage in hemisphere, unspecified: Secondary | ICD-10-CM | POA: Diagnosis not present

## 2020-01-26 DIAGNOSIS — H5340 Unspecified visual field defects: Secondary | ICD-10-CM | POA: Diagnosis present

## 2020-01-26 DIAGNOSIS — I618 Other nontraumatic intracerebral hemorrhage: Secondary | ICD-10-CM | POA: Diagnosis not present

## 2020-01-26 DIAGNOSIS — G8194 Hemiplegia, unspecified affecting left nondominant side: Secondary | ICD-10-CM | POA: Diagnosis present

## 2020-01-26 DIAGNOSIS — E7800 Pure hypercholesterolemia, unspecified: Secondary | ICD-10-CM | POA: Diagnosis not present

## 2020-01-26 DIAGNOSIS — E871 Hypo-osmolality and hyponatremia: Secondary | ICD-10-CM | POA: Diagnosis not present

## 2020-01-26 DIAGNOSIS — R1310 Dysphagia, unspecified: Secondary | ICD-10-CM | POA: Diagnosis not present

## 2020-01-26 DIAGNOSIS — K5900 Constipation, unspecified: Secondary | ICD-10-CM | POA: Diagnosis not present

## 2020-01-26 DIAGNOSIS — B961 Klebsiella pneumoniae [K. pneumoniae] as the cause of diseases classified elsewhere: Secondary | ICD-10-CM | POA: Diagnosis not present

## 2020-01-26 DIAGNOSIS — A499 Bacterial infection, unspecified: Secondary | ICD-10-CM | POA: Diagnosis not present

## 2020-01-26 HISTORY — DX: Unspecified sequelae of cerebral infarction: I69.30

## 2020-01-26 HISTORY — DX: Hemiplegia, unspecified affecting right dominant side: G81.91

## 2020-01-26 LAB — COMPREHENSIVE METABOLIC PANEL
ALT: 26 U/L (ref 0–44)
AST: 32 U/L (ref 15–41)
Albumin: 4.3 g/dL (ref 3.5–5.0)
Alkaline Phosphatase: 71 U/L (ref 38–126)
Anion gap: 9 (ref 5–15)
BUN: 17 mg/dL (ref 8–23)
CO2: 24 mmol/L (ref 22–32)
Calcium: 9 mg/dL (ref 8.9–10.3)
Chloride: 100 mmol/L (ref 98–111)
Creatinine, Ser: 0.96 mg/dL (ref 0.44–1.00)
GFR calc Af Amer: >60 mL/min (ref >60)
GFR calc non Af Amer: 58 mL/min — ABNORMAL LOW (ref >60)
Glucose, Bld: 133 mg/dL — ABNORMAL HIGH (ref 70–99)
Potassium: 3.8 mmol/L (ref 3.5–5.1)
Sodium: 133 mmol/L — ABNORMAL LOW (ref 135–145)
Total Bilirubin: 0.4 mg/dL (ref 0.3–1.2)
Total Protein: 7.1 g/dL (ref 6.5–8.1)

## 2020-01-26 LAB — RAPID URINE DRUG SCREEN, HOSP PERFORMED
Amphetamines: NOT DETECTED
Barbiturates: NOT DETECTED
Benzodiazepines: POSITIVE — AB
Cocaine: NOT DETECTED
Opiates: NOT DETECTED
Tetrahydrocannabinol: NOT DETECTED

## 2020-01-26 LAB — CBC
HCT: 37.5 % (ref 36.0–46.0)
Hemoglobin: 12.9 g/dL (ref 12.0–15.0)
MCH: 30.8 pg (ref 26.0–34.0)
MCHC: 34.4 g/dL (ref 30.0–36.0)
MCV: 89.5 fL (ref 80.0–100.0)
Platelets: 173 10*3/uL (ref 150–400)
RBC: 4.19 MIL/uL (ref 3.87–5.11)
RDW: 12.7 % (ref 11.5–15.5)
WBC: 6.8 10*3/uL (ref 4.0–10.5)
nRBC: 0 % (ref 0.0–0.2)

## 2020-01-26 LAB — DIFFERENTIAL
Abs Immature Granulocytes: 0.01 10*3/uL (ref 0.00–0.07)
Basophils Absolute: 0 10*3/uL (ref 0.0–0.1)
Basophils Relative: 0 %
Eosinophils Absolute: 0.1 10*3/uL (ref 0.0–0.5)
Eosinophils Relative: 2 %
Immature Granulocytes: 0 %
Lymphocytes Relative: 57 %
Lymphs Abs: 3.8 10*3/uL (ref 0.7–4.0)
Monocytes Absolute: 0.4 10*3/uL (ref 0.1–1.0)
Monocytes Relative: 6 %
Neutro Abs: 2.4 10*3/uL (ref 1.7–7.7)
Neutrophils Relative %: 35 %

## 2020-01-26 LAB — URINALYSIS, ROUTINE W REFLEX MICROSCOPIC
Bilirubin Urine: NEGATIVE
Glucose, UA: NEGATIVE mg/dL
Ketones, ur: NEGATIVE mg/dL
Nitrite: NEGATIVE
Protein, ur: NEGATIVE mg/dL
Specific Gravity, Urine: 1.009 (ref 1.005–1.030)
pH: 7 (ref 5.0–8.0)

## 2020-01-26 LAB — ETHANOL: Alcohol, Ethyl (B): <10 mg/dL (ref <10)

## 2020-01-26 LAB — PROTIME-INR
INR: 1 (ref 0.8–1.2)
Prothrombin Time: 13 seconds (ref 11.4–15.2)

## 2020-01-26 LAB — CBG MONITORING, ED: Glucose-Capillary: 129 mg/dL — ABNORMAL HIGH (ref 70–99)

## 2020-01-26 LAB — APTT: aPTT: 27 seconds (ref 24–36)

## 2020-01-26 LAB — SARS CORONAVIRUS 2 BY RT PCR (HOSPITAL ORDER, PERFORMED IN ~~LOC~~ HOSPITAL LAB): SARS Coronavirus 2: NEGATIVE

## 2020-01-26 MED ORDER — ACETAMINOPHEN 325 MG PO TABS
650.0000 mg | ORAL_TABLET | ORAL | Status: DC | PRN
  Administered 2020-01-26 – 2020-01-28 (×3): 650 mg via ORAL
  Filled 2020-01-26 (×3): qty 2

## 2020-01-26 MED ORDER — STROKE: EARLY STAGES OF RECOVERY BOOK
Freq: Once | Status: DC
  Filled 2020-01-26 (×3): qty 1

## 2020-01-26 MED ORDER — ONDANSETRON HCL 4 MG/2ML IJ SOLN
4.0000 mg | Freq: Once | INTRAMUSCULAR | Status: AC
  Administered 2020-01-26: 4 mg via INTRAVENOUS
  Filled 2020-01-26: qty 2

## 2020-01-26 MED ORDER — ONDANSETRON HCL 4 MG/2ML IJ SOLN
4.0000 mg | Freq: Four times a day (QID) | INTRAMUSCULAR | Status: DC | PRN
  Filled 2020-01-26: qty 2

## 2020-01-26 MED ORDER — CLEVIDIPINE BUTYRATE 0.5 MG/ML IV EMUL
0.0000 mg/h | INTRAVENOUS | Status: DC
  Administered 2020-01-26: 1 mg/h via INTRAVENOUS
  Administered 2020-01-26: 15 mg/h via INTRAVENOUS
  Administered 2020-01-26: 16 mg/h via INTRAVENOUS
  Administered 2020-01-27: 2 mg/h via INTRAVENOUS
  Administered 2020-01-27: 8 mg/h via INTRAVENOUS
  Filled 2020-01-26: qty 100
  Filled 2020-01-26 (×3): qty 50
  Filled 2020-01-26: qty 100

## 2020-01-26 MED ORDER — SENNOSIDES-DOCUSATE SODIUM 8.6-50 MG PO TABS
1.0000 | ORAL_TABLET | Freq: Two times a day (BID) | ORAL | Status: DC
  Administered 2020-01-27 – 2020-01-29 (×5): 1 via ORAL
  Filled 2020-01-26 (×7): qty 1

## 2020-01-26 MED ORDER — ACETAMINOPHEN 160 MG/5ML PO SOLN: 650.0000 mg | ORAL | Status: DC | PRN

## 2020-01-26 MED ORDER — ONDANSETRON HCL 4 MG/2ML IJ SOLN
INTRAMUSCULAR | Status: AC
  Filled 2020-01-26: qty 2

## 2020-01-26 MED ORDER — ACETAMINOPHEN 650 MG RE SUPP: 650.0000 mg | RECTAL | Status: DC | PRN

## 2020-01-26 MED ORDER — ONDANSETRON HCL 4 MG/2ML IJ SOLN
4.0000 mg | Freq: Once | INTRAMUSCULAR | Status: AC
  Administered 2020-01-26: 4 mg via INTRAVENOUS

## 2020-01-26 MED ORDER — PANTOPRAZOLE SODIUM 40 MG IV SOLR: 40.0000 mg | Freq: Every day | INTRAVENOUS | Status: DC

## 2020-01-26 MED ORDER — CHLORHEXIDINE GLUCONATE CLOTH 2 % EX PADS
6.0000 | MEDICATED_PAD | Freq: Every day | CUTANEOUS | Status: DC
  Administered 2020-01-27 – 2020-01-29 (×3): 6 via TOPICAL

## 2020-01-26 MED ORDER — CHLORHEXIDINE GLUCONATE 0.12 % MT SOLN
15.0000 mL | Freq: Two times a day (BID) | OROMUCOSAL | Status: DC
  Administered 2020-01-27 – 2020-01-29 (×5): 15 mL via OROMUCOSAL
  Filled 2020-01-26 (×4): qty 15

## 2020-01-26 MED ORDER — IOHEXOL 350 MG/ML SOLN
75.0000 mL | Freq: Once | INTRAVENOUS | Status: AC | PRN
  Administered 2020-01-26: 75 mL via INTRAVENOUS

## 2020-01-26 NOTE — ED Notes (Signed)
Patient transported to CT 

## 2020-01-26 NOTE — ED Triage Notes (Signed)
[facility] - pt comes from home with sudden right sided weakness. Pt was outside when her right hip became numb and then moved the rest of the way down her leg. LKW 1830. CBG 118. 20 L AC

## 2020-01-26 NOTE — Progress Notes (Signed)
Code stroke ptorocol Right sided weakness last known normal status 6pm  6pm est  01/26/20  [facility] Dr. Regenia Skeeter  [phone]  Call timre 1909 No beeper Exam staratrr 1918 Exam finished 1919 Images sent to [place] Exam ended in epic and [facility] caolled 1919

## 2020-01-26 NOTE — H&P (Addendum)
Stroke neurology history and physical-ICH  CC: Right-sided weakness  History is obtained from: Patient, chart  HPI: Alison Cuevas is a 74 y.o. female past medical history of chronic low back pain, degenerative disc disease, hypertension, hyperlipidemia, anxiety, presented to [facility] emergency room as an acute code stroke for sudden onset of right-sided weakness. Last known normal 6:30 PM while she was out in the garden.  Sudden onset of right arm and leg feeling numb and then weak.  She still feels complete numbness of that side and also not able to move the right side at all. Stat head CT done revealed a intraparenchymal hematoma centered at the left frontoparietal convexity of 14 cc volume with associated subdural extension with a small subdural hemorrhage overlying the left cerebral convexity measuring up to 4 mm in thickness.  There is associated 4 mm left to right midline shift with no hydrocephalus or trapping.  Underlying age-related changes also seen on CT. Neurology consulted for admission to neurological ICU. Required Cleviprex for elevated blood pressure above 562 systolic. Denies current headache.  Reports overall body pain.  LKW: 6:30 PM on January 26, 2020 tpa given?: no, ICH Premorbid modified Rankin scale (mRS):0  ROS: Performed and negative except as noted in HPI. Past Medical History:  Diagnosis Date  . Allergic rhinitis    maple pollen  . Anxiety   . Arthritis   . Bowel obstruction ([place])   . Chronic low back pain    Dr. Trenton Gammon.  Has had back injection--bp elevated after.  . Chronic pain syndrome   . Chronic renal insufficiency, stage 2 (mild)    GFR 60-70  . DDD (degenerative disc disease), cervical    Hx of ACDF (Dr. [name]).  Followed by Dr. [name].  Also, Dr. [name] do left C7-T1 intralaminal epidural injection.  . Diverticulosis of colon   . DJD (degenerative joint disease)   . Gallstones   . GERD (gastroesophageal reflux disease)   .  Herpes zoster 07/08/2014  . Hypercholesteremia    mild; pt declined statin trial 05/2014--needs recheck lipid panel at first f/u visit in 2017  . Hypertension   . Osteopenia 06/2017   T score -1.4 FRAX 15% / 2%  . Ovarian cyst 2017   [id number]-robotic assisted bilatel SPO: all path benign.  . Perianal dermatitis    prn cutivate  . Phlebitis   . Right knee injury 2018   Patellofemoral crush injury--Dr. [name].  . Trochanteric bursitis of right hip    Recurrent (injection by Dr. [name] 05/26/15)  . Tympanic membrane rupture, right 12/01/2016   As of 08/2018 pt set for tympanomastoidectomy and STSG (Dr. [name]). Recurrent fungal/bact OE.  . Varicose vein    left leg      Family History  Problem Relation Age of Onset  . Hypertension Mother   . Diverticulitis Mother   . Breast cancer Sister 17  . Melanoma Brother   . Prostate cancer Brother   . Leukemia Brother   . Lupus Sister   . Lung disease Brother   . Stomach cancer Father   . Other Other        Family member with MGUS    Social History:   reports that she has never smoked. She has never used smokeless tobacco. She reports that she does not drink alcohol and does not use drugs.  Medications  Current Facility-Administered Medications:  .   stroke: mapping our early stages of recovery book, , Does not apply, Once, Amie Portland,  MD .  acetaminophen (TYLENOL) tablet 650 mg, 650 mg, Oral, Q4H PRN **OR** acetaminophen (TYLENOL) 160 MG/5ML solution 650 mg, 650 mg, Per Tube, Q4H PRN **OR** acetaminophen (TYLENOL) suppository 650 mg, 650 mg, Rectal, Q4H PRN, Amie Portland, MD .  Derrill Memo ON 01/27/2020] chlorhexidine (PERIDEX) 0.12 % solution 15 mL, 15 mL, Mouth Rinse, BID, Amie Portland, MD .  Derrill Memo ON 01/27/2020] Chlorhexidine Gluconate Cloth 2 % PADS 6 each, 6 each, Topical, Daily, Amie Portland, MD .  clevidipine (CLEVIPREX) infusion 0.5 mg/mL, 0-21 mg/hr, Intravenous, Continuous, Sherwood Gambler, MD, Last Rate: 32 mL/hr at 01/26/20  2138, 16 mg/hr at 01/26/20 2138 .  ondansetron (ZOFRAN) injection 4 mg, 4 mg, Intravenous, Q6H PRN, Amie Portland, MD .  pantoprazole (PROTONIX) injection 40 mg, 40 mg, Intravenous, QHS, Amie Portland, MD .  senna-docusate (Senokot-S) tablet 1 tablet, 1 tablet, Oral, BID, Amie Portland, MD  Exam: Current vital signs: BP (!) 124/87 (BP Location: Right Arm)   Pulse (!) 122   Temp 98 F (36.7 C) (Oral)   Resp 18   Ht 5' (1.524 m)   Wt 59 kg   LMP 06/27/1972   SpO2 94%   BMI 25.39 kg/m  Vital signs in last 24 hours: Temp:  [98 F (36.7 C)] 98 F (36.7 C) (08/01 2135) Pulse Rate:  [85-125] 122 (08/01 2135) Resp:  [11-28] 18 (08/01 2135) BP: (121-161)/(56-87) 124/87 (08/01 2135) SpO2:  [93 %-96 %] 94 % (08/01 2135) Weight:  [59 kg] 59 kg (08/01 1924) General: Awake alert in no distress HEENT: Normocephalic atraumatic CVS: Regular rate rhythm Respiratory: Breathing well saturating normally on room air at 94%.  Brought in transport on 2 L oxygen with saturations in the 96 to 98% range. Extremities warm well perfused Neurological exam Awake alert oriented x3 Mild dysarthria No aphasia Follows all commands Cranial nerves: Pupils equal round react light, extraocular movements intact, visual fields full to confrontation, facial sensation intact, mild right nasolabial fold flattening observed, auditory acuity intact, tongue and palate midline. Motor exam: Flaccid right upper and lower extremity.  Left upper and lower extremity 5/5 Sensory exam: Complete sensory loss to light touch on the left side in comparison to the right. Coordination: Unable to perform on the right, no dysmetria on the left Gait testing deferred at this time NIH stroke scale 1a Level of Conscious.: 0 1b LOC Questions: 0 1c LOC Commands: 0 2 Best Gaze: 0 3 Visual: 0 4 Facial Palsy: 1 5a Motor Arm - left: 0 5b Motor Arm - Right: 4 6a Motor Leg - Left: 0 6b Motor Leg - Right: 4 7 Limb Ataxia: 0 8 Sensory:  2 9 Best Language: 0 10 Dysarthria: 1 11 Extinct. and Inatten.: 0 TOTAL: 12   Labs I have reviewed labs in epic and the results pertinent to this consultation are: CBC    Component Value Date/Time   WBC 6.8 01/26/2020 1918   RBC 4.19 01/26/2020 1918   HGB 12.9 01/26/2020 1918   HCT 37.5 01/26/2020 1918   PLT 173 01/26/2020 1918   MCV 89.5 01/26/2020 1918   MCH 30.8 01/26/2020 1918   MCHC 34.4 01/26/2020 1918   RDW 12.7 01/26/2020 1918   LYMPHSABS 3.8 01/26/2020 1918   MONOABS 0.4 01/26/2020 1918   EOSABS 0.1 01/26/2020 1918   BASOSABS 0.0 01/26/2020 1918    CMP     Component Value Date/Time   NA 133 (L) 01/26/2020 1918   K 3.8 01/26/2020 1918   CL 100 01/26/2020 1918  CO2 24 01/26/2020 1918   GLUCOSE 133 (H) 01/26/2020 1918   BUN 17 01/26/2020 1918   CREATININE 0.96 01/26/2020 1918   CREATININE 0.82 12/19/2017 1207   CALCIUM 9.0 01/26/2020 1918   PROT 7.1 01/26/2020 1918   ALBUMIN 4.3 01/26/2020 1918   AST 32 01/26/2020 1918   ALT 26 01/26/2020 1918   ALKPHOS 71 01/26/2020 1918   BILITOT 0.4 01/26/2020 1918   GFRNONAA 58 (L) 01/26/2020 1918   GFRAA >60 01/26/2020 1918    Lipid Panel     Component Value Date/Time   CHOL 165 03/22/2018 1124   TRIG 75.0 03/22/2018 1124   HDL 60.90 03/22/2018 1124   CHOLHDL 3 03/22/2018 1124   VLDL 15.0 03/22/2018 1124   LDLCALC 89 03/22/2018 1124   LDLDIRECT 136.9 09/20/2007 1009     Imaging I have reviewed the images obtained:  CT-scan of the brain-14 cc hematoma high left frontoparietal convexity on the left.  Associated subdural extension with small subdural hemorrhage overlying the left cerebral convexity up to 4 mm in thickness with associated 4 mm left to right midline shift with no hydrocephalus or trapping.  Age-related changes CTA head and neck recommended and pending CT venogram head recommended and pending  Assessment:  74 year old with sudden onset of right hemiplegia with elevated blood pressures with  CT scan showing a ICH in the left frontoparietal region with associated subdural extension and small subdural hemorrhage overlying the left cerebral convexity of 4 mm thickness causing 4 mm left to right midline shift. Examination consistent with left hemiplegia, mild dysarthria and mild left facial weakness. She is awake alert oriented x3 with no aphasia. Likely hypertensive bleed versus amyloid angiopathy. Given the location and proximity to the sagittal sinus, a venous infarct with hemorrhagic transformation is also possible.  Impression Intracerebral hemorrhage nontraumatic cortical and subcortical on the left hemisphere Cerebral edema Hypertensive emergency Essential hypertension Possible UTI  ICH volume 14 cc ICH score 0   PLAN:  #Intracerebral hemorrhage, cerebral edema: -Admit to neuro ICU -Systolic blood pressure goal less than 140.  Use labetalol or hydralazine IV as needed and Cleviprex drip as needed.  Currently on Cleviprex drip while transported from [facility]. -MRI brain with and without contrast -CTA head and neck look for any vascular malformation -CTV of the head to look for any evidence of dural venous sinus thrombosis -2D echo, A1c, lipid panel -Frequent neuro checks and telemetry -No need for hypertonic's yet.  Will repeat imaging and decide if needed.  MRI brain has been ordered for 8 AM. -For dysphagia and dysarthria associated with the ICH-speech therapy -PT OT -N.p.o. until cleared by stroke swallow screen or formal swallow evaluation -No antiplatelets or anticoagulants.  Use SCD for DVT prophylaxis  #Essential hypertension and hypertensive emergency -Blood pressure goals as above  #Possible UTI -Rare bacteria.  Question contamination. -Hold antibiotics.  Obtain urine culture  #GI/GU/Hem/ID Check a.m. labs-CBC BMP Replete electrolytes as necessary Check chest x-ray, urine drug screen  FEN: N.p.o. until cleared by bedside swallow or  formal swallow evaluation. IV fluids-normal saline 75 cc an hour DVT prophylaxis: SCDs only   CODE STATUS: Full code   THE FOLLOWING WERE PRESENT ON ADMISSION: Intracerebral hemorrhage, cerebral edema, possible UTI, hypertensive emergency, hemiplegia and hemiparesis, dysphagia and dysarthria  -- Amie Portland, MD [facility] Pager: [phone] If 7pm to 7am, please call on call as listed on AMION.   CRITICAL CARE ATTESTATION Performed by: Amie Portland, MD Total critical care time:  55 minutes Critical care time was exclusive of separately billable procedures and treating other patients and/or supervising APPs/Residents/Students Critical care was necessary to treat or prevent imminent or life-threatening deterioration due to intracerebral hemorrhage, hypertensive emergency This patient is critically ill and at significant risk for neurological worsening and/or death and care requires constant monitoring. Critical care was time spent personally by me on the following activities: development of treatment plan with patient and/or surrogate as well as nursing, discussions with consultants, evaluation of patient's response to treatment, examination of patient, obtaining history from patient or surrogate, ordering and performing treatments and interventions, ordering and review of laboratory studies, ordering and review of radiographic studies, pulse oximetry, re-evaluation of patient's condition, participation in multidisciplinary rounds and medical decision making of high complexity in the care of this patient.

## 2020-01-26 NOTE — ED Provider Notes (Signed)
[facility] EMERGENCY DEPARTMENT Provider Note   CSN: [id number] Arrival date & time: 01/26/20  1913  LEVEL 5 CAVEAT - ACUITY OF CONDITION History Chief Complaint  Patient presents with  . Code Stroke    Alison Cuevas is a 74 y.o. female.  HPI 74 year old female presents with acute right-sided weakness.  Started around 6:30 PM while she was gardening.  No fall or headache.  No vomiting.  Right arm and leg felt numb and then weak.  The weakness seems to be slightly improving though is still pretty significant.  Code stroke called by EMS. No blood thinners.    Past Medical History:  Diagnosis Date  . Allergic rhinitis    maple pollen  . Anxiety   . Arthritis   . Bowel obstruction ([place])   . Chronic low back pain    Dr. Trenton Gammon.  Has had back injection--bp elevated after.  . Chronic pain syndrome   . Chronic renal insufficiency, stage 2 (mild)    GFR 60-70  . DDD (degenerative disc disease), cervical    Hx of ACDF (Dr. [name]).  Followed by Dr. [name].  Also, Dr. [name] do left C7-T1 intralaminal epidural injection.  . Diverticulosis of colon   . DJD (degenerative joint disease)   . Gallstones   . GERD (gastroesophageal reflux disease)   . Herpes zoster 07/08/2014  . Hypercholesteremia    mild; pt declined statin trial 05/2014--needs recheck lipid panel at first f/u visit in 2017  . Hypertension   . Osteopenia 06/2017   T score -1.4 FRAX 15% / 2%  . Ovarian cyst 2017   82019-robotic assisted bilatel SPO: all path benign.  . Perianal dermatitis    prn cutivate  . Phlebitis   . Right knee injury 2018   Patellofemoral crush injury--Dr. [name].  . Trochanteric bursitis of right hip    Recurrent (injection by Dr. [name] 05/26/15)  . Tympanic membrane rupture, right 12/01/2016   As of 08/2018 pt set for tympanomastoidectomy and STSG (Dr. [name]). Recurrent fungal/bact OE.  . Varicose vein    left leg     Patient Active Problem List   Diagnosis Date  Noted  . Conductive hearing loss of right ear with unrestricted hearing of left ear 05/28/2019  . Hyponatremia 08/31/2017  . Generalized weakness 08/31/2017  . Dehydration 08/31/2017  . UTI (urinary tract infection) 08/31/2017  . Tympanic membrane rupture, right 12/01/2016  . Right ovarian cyst 08/22/2016  . Fibromyalgia 06/03/2015  . Cystocele 03/18/2014  . 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 Diverticulosis of colon without hemorrhage 06/12/2013  . Hypertension   . Osteopenia   . Chronic low back pain 05/12/2011  . HYPERCHOLESTEROLEMIA 03/17/2008  . Anxiety state 09/17/2007  . GERD 09/17/2007  . DEGENERATIVE JOINT DISEASE 09/17/2007    Past Surgical History:  Procedure Laterality Date  . ABDOMINAL HYSTERECTOMY  03/2015   TAH.  Last pap 2016.  No hx of abnl paps.  Per GYN, no further pap smears indicated.  . ANTERIOR AND POSTERIOR REPAIR N/A 03/18/2014   Procedure: Cystocele repair with graft, Vault suspension, Rectocele repair;  Surgeon: [name], MD;  Location: [facility];  Service: Urology;  Laterality: N/A;  . CHOLECYSTECTOMY    . CHOLECYSTECTOMY, LAPAROSCOPIC    . COLON SURGERY    . COLONOSCOPY  04/2006; 05/26/16   2007 (Dr. [name]): Normal.  04/2016 (Dr. [name]) normal except diverticulosis and decreased anal sphincter tone.  No repeat colonoscopy is recommended due to age.  . cspine surgery  Dr. Wiliam Ke level ant cerv discectomy /fusion w/plating  . CYSTO N/A 03/18/2014   Procedure: CYSTO;  Surgeon: [name], MD;  Location: [facility];  Service: Urology;  Laterality: N/A;  . DEXA  07/30/2015; 06/2018   2017 and 2020 -->Osteopenia--repeat 2 yrs.  Marland Kitchen LYSIS OF ADHESION N/A 01/31/2018   Procedure: POSSIBLE LYSIS OF ADHESION;  Surgeon: Everitt Amber, MD;  Location: [facility];  Service: Gynecology;  Laterality: N/A;  . [place]     with prolasped bledder repair  . RESECTION OF COLON     BENIGN TUMOR  . RIGHT EAR SURGERY  08/23/2017   Dr. 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 right ear surgery  12/12/2018   Right tympanomastoidectomy, ossiculoplasty with partial prosthesis, split thickness skin graft from postauricular skin 1x1cm, and right tragal cartilage graft 1x1 cm [facility])  . right hemicolectomy for diverticulitis with abscess  1993  . ROBOTIC ASSISTED BILATERAL SALPINGO OOPHERECTOMY Bilateral 01/31/2018   All PATH benign.  Procedure: XI ROBOTIC ASSISTED BILATERAL SALPINGO OOPHORECTOMY;  Surgeon: Everitt Amber, MD;  Location: [facility];  Service: Gynecology;  Laterality: Bilateral;  . TUBAL LIGATION       OB History    Gravida  4   Para  3   Term      Preterm      AB  1   Living  3     SAB      TAB      Ectopic      Multiple      Live Births              Family History  Problem Relation Age of Onset  . Hypertension Mother   . Diverticulitis Mother   . Breast cancer Sister 63  . Melanoma Brother   . Prostate cancer Brother   . Leukemia Brother   . Lupus Sister   . Lung disease Brother   . Stomach cancer Father   . Other Other        Family member with MGUS    Social History   Tobacco Use  . Smoking status: Never Smoker  . Smokeless tobacco: Never Used  Vaping Use  . Vaping Use: Never used  Substance Use Topics  . Alcohol use: No    Alcohol/week: 0.0 standard drinks  . Drug use: No    Home Medications Prior to Admission medications   Medication Sig Start Date End Date Taking? Authorizing Provider  ALPRAZolam (XANAX) 0.5 MG tablet TAKE 1 TABLET BY MOUTH THREE TIMES A DAY AS NEEDED FOR ANXIETY 01/10/20   McGowen, Adrian Blackwater, MD  ascorbic acid (VITAMIN C) 1000 MG tablet Take by mouth.    [provider]  ASPIRIN 81 PO Take by mouth as needed.    [provider]  betamethasone valerate ointment (VALISONE) 0.1 % APPLY A SMALL AMOUNT TO AFFECTED AREA TWICE  A DAY 04/09/19   [provider]  Biotin 10000 MCG TABS Take 10,000 mcg by mouth daily.     [provider]  BLACK COHOSH PO Take by mouth daily. Patient not taking: Reported on 01/01/2020    [provider]  calcium carbonate (OSCAL) 1500 (600 Ca) MG TABS tablet Take 600 mg of elemental calcium by mouth daily with breakfast.    [provider]  Carboxymethylcellul-Glycerin ([place] [facility]) Place  1 drop into both eyes daily as needed (dry eyes).    [provider]  Cholecalciferol (VITAMIN D3) 5000 units CAPS Take 5,000 Units by mouth daily.    [provider]  CIPRODEX OTIC suspension Place 3 drops into the right ear 2 (two) times daily.  12/10/16   [provider]  estradiol (ESTRACE VAGINAL) 0.1 MG/GM vaginal cream Apply 1/4 applicator 3 times weekly vaginally. Patient not taking: Reported on 09/25/2019 08/05/14   Fontaine, Belinda Block, MD  famotidine (PEPCID) 40 MG tablet TAKE 1 TABLET BY MOUTH TWICE A DAY Patient not taking: Reported on 09/25/2019 01/14/19   [name], MD  fluticasone (CUTIVATE) 0.05 % cream Apply to affected area bid prn Patient not taking: Reported on 09/25/2019 08/08/16   [name], MD  fluticasone (FLONASE) 50 MCG/ACT nasal spray Place 2 sprays into both nostrils at bedtime. 05/17/16   McGowen, Adrian Blackwater, MD  HYDROcodone-acetaminophen (NORCO/VICODIN) 5-325 MG tablet 1 tab po tid prn pain 09/25/19   McGowen, Adrian Blackwater, MD  Lactobacillus-Inulin (CULTURELLE DIGESTIVE HEALTH PO) Take 1 tablet by mouth daily as needed (upset stomach).     [provider]  lisinopril (ZESTRIL) 10 MG tablet TAKE 1 TABLET BY MOUTH EVERY DAY 01/13/20   McGowen, Adrian Blackwater, MD  loratadine (CLARITIN) 10 MG tablet Take 10 mg by mouth every other day.     [provider]  meclizine (ANTIVERT) 12.5 MG tablet 1-2 tabs po tid prn Patient not taking: Reported on 08/28/2019 07/18/19   [name], MD  meloxicam  (MOBIC) 15 MG tablet TAKE 1 TABLET BY MOUTH EVERY DAY WITH FOOD AS NEEDED FOR MUSCULOSKELETAL PAIN 06/11/19   McGowen, Adrian Blackwater, MD  Multiple Minerals-Vitamins (CALCIUM-MAGNESIUM-ZINC-D3 PO) Take by mouth. Pt takes 1 daily.    [provider]  Multiple Vitamins-Minerals (MULTIVITAMIN ADULT) TABS Take by mouth daily.    [provider]  ofloxacin (FLOXIN) 0.3 % OTIC solution Place 5 drops into the right ear 2 (two) times daily. For up to 7 days for ear pain or drainage. Patient not taking: Reported on 01/01/2020 08/23/17   [provider]  ondansetron (ZOFRAN) 4 MG tablet Take 1 tablet (4 mg total) by mouth every 8 (eight) hours as needed for nausea or vomiting. Patient not taking: Reported on 09/25/2019 07/03/18   [name], MD  POTASSIUM PO Take 1 tablet by mouth daily.     [provider]  POWDERS EX Use one puff in affected ear(s) daily or as directed by MD Patient not taking: Reported on 01/01/2020 04/05/19   [provider]  PREVIDENT 5000 BOOSTER PLUS 1.1 % PSTE BRUSH TEETH 2 TO 3 TIMES A DAY. AFTER USE SPIT OUT, BUT DO NOT RINSE. 01/22/18   [provider]  Simethicone (GAS-X PO) Take 1 tablet by mouth daily as needed (gas).    [provider]  triamcinolone ointment (KENALOG) 0.1 % Apply 1 application topically 2 (two) times daily. Patient not taking: Reported on 01/01/2020 04/29/19   [name], MD  verapamil (CALAN-SR) 180 MG CR tablet 1 tab po qd 09/25/19   McGowen, Adrian Blackwater, MD  vitamin B-12 (CYANOCOBALAMIN) 1000 MCG tablet Take 2,000 mcg by mouth daily.    [provider]    Allergies    Gabapentin and Prednisone  Review of Systems   Review of Systems  Unable to perform ROS: Acuity of condition    Physical Exam Updated Vital Signs BP (!) 144/72  Pulse 87   Temp 98 F (36.7 C)   Resp 23   Ht 5' (1.524 m)   Wt 59 kg   LMP 06/27/1972   SpO2 96%   BMI 25.39 kg/m   Physical Exam Vitals and  nursing note reviewed.  Constitutional:      General: She is not in acute distress.    Appearance: She is well-developed. She is not diaphoretic.  HENT:     Head: Normocephalic and atraumatic.     Right Ear: External ear normal.     Left Ear: External ear normal.     Nose: Nose normal.  Eyes:     General:        Right eye: No discharge.        Left eye: No discharge.  Cardiovascular:     Rate and Rhythm: Normal rate and regular rhythm.     Heart sounds: Normal heart sounds.  Pulmonary:     Effort: Pulmonary effort is normal.     Breath sounds: Normal breath sounds.  Abdominal:     Palpations: Abdomen is soft.     Tenderness: There is no abdominal tenderness.  Skin:    General: Skin is warm and dry.  Neurological:     Mental Status: She is alert.     Comments: Awake, alert. No facial droop or slurred speech. 3/5 strength right arm and leg. Decreased subjective sensation. 5/5 strength LUE, LLE.   Psychiatric:        Mood and Affect: Mood is not anxious.     ED Results / Procedures / Treatments   Labs (all labs ordered are listed, but only abnormal results are displayed) Labs Reviewed  COMPREHENSIVE METABOLIC PANEL - Abnormal; Notable for the following components:      Result Value   Sodium 133 (*)    Glucose, Bld 133 (*)    GFR calc non Af Amer 58 (*)    All other components within normal limits  CBG MONITORING, ED - Abnormal; Notable for the following components:   Glucose-Capillary 129 (*)    All other components within normal limits  SARS CORONAVIRUS 2 BY RT PCR (HOSPITAL ORDER, [facility])  ETHANOL  PROTIME-INR  APTT  CBC  DIFFERENTIAL  RAPID URINE DRUG SCREEN, HOSP PERFORMED  URINALYSIS, ROUTINE W REFLEX MICROSCOPIC    EKG None  Radiology CT HEAD CODE STROKE WO CONTRAST  Result Date: 01/26/2020 CLINICAL DATA:  Code stroke. Initial evaluation for acute right-sided weakness. EXAM: CT HEAD WITHOUT CONTRAST TECHNIQUE: Contiguous  axial images were obtained from the base of the skull through the vertex without intravenous contrast. COMPARISON:  None. FINDINGS: Brain: There is an acute intraparenchymal hemorrhage centered at the high left frontoparietal convexity measuring 3.6 x 2.6 x 3.0 cm (estimated volume 14 cc). Mild surrounding vasogenic edema. Associated subdural extension with a small subdural hemorrhage overlying the left cerebral convexity measuring up to 4 mm in thickness. Associated 4 mm left-to-right midline shift. No hydrocephalus or trapping. No other acute intracranial hemorrhage. No other acute large vessel territory infarct. Underlying age-related cerebral atrophy with moderate chronic microvascular ischemic disease. Small remote right cerebellar infarct noted. Vascular: No hyperdense vessel. Skull: Scalp soft tissues and calvarium within normal limits. Sinuses/Orbits: Globes and orbital soft tissues within normal limits. Left sphenoid sinusitis noted. Paranasal sinuses are otherwise clear. Sequelae of prior right mastoidectomy with opacification of the residual mastoid air cells. Other: None. IMPRESSION: 1. 3.6 x 2.6 x 3.0 cm acute  intraparenchymal hemorrhage centered at the high left frontoparietal convexity (estimated volume 14 cc). Associated subdural extension with a small subdural hemorrhage overlying the left cerebral convexity measuring up to 4 mm in thickness. Associated 4 mm left-to-right midline shift. No hydrocephalus or trapping. 2. Underlying age-related cerebral atrophy with moderate chronic microvascular ischemic disease. Critical Value/emergent results were called by telephone at the time of interpretation on 01/26/2020 at 7:34 pm to provider Sherwood Gambler , who verbally acknowledged these results. Electronically Signed   By: Jeannine Boga M.D.   On: 01/26/2020 19:38    Procedures .Critical Care Performed by: Sherwood Gambler, MD Authorized by: Sherwood Gambler, MD   Critical care provider  statement:    Critical care time (minutes):  45   Critical care time was exclusive of:  Separately billable procedures and treating other patients   Critical care was necessary to treat or prevent imminent or life-threatening deterioration of the following conditions:  CNS failure or compromise   Critical care was time spent personally by me on the following activities:  Discussions with consultants, evaluation of patient's response to treatment, examination of patient, ordering and performing treatments and interventions, ordering and review of laboratory studies, ordering and review of radiographic studies, pulse oximetry, re-evaluation of patient's condition, obtaining history from patient or surrogate and review of old charts   (including critical care time)  Medications Ordered in ED Medications  clevidipine (CLEVIPREX) infusion 0.5 mg/mL (8 mg/hr Intravenous Rate/Dose Change 01/26/20 1954)  iohexol (OMNIPAQUE) 350 MG/ML injection 75 mL (has no administration in time range)  ondansetron (ZOFRAN) injection 4 mg (4 mg Intravenous Given 01/26/20 1939)    ED Course  I have reviewed the triage vital signs and the nursing notes.  Pertinent labs & imaging results that were available during my care of the patient were reviewed by me and considered in my medical decision making (see chart for details).    MDM Rules/Calculators/A&P                          Patient is found to have intraparenchymal hemorrhage.  She is moderately hypertensive at 470 systolic and was placed on Cleviprex.  Has some nausea and was given Zofran.  She definitively did not fall per her.  Discussed with neurology, Dr. Rory Percy, who will admit at [facility] but given significant delay for ICU beds he recommends ED to ED transfer.  He asks for CT angiography of head and neck and CT venogram of head.  Discussed with Dr. Ron Parker, [place] who accepts in transfer.  Airway has remained stable, and she has had good mental status while in the  ER.  Final Clinical Impression(s) / ED Diagnoses Final diagnoses:  Intraparenchymal hemorrhage of brain [facility])    Rx / DC Orders ED Discharge Orders    None       Sherwood Gambler, MD 01/26/20 2032

## 2020-01-26 NOTE — ED Notes (Signed)
This RN attempted to call report to [facility], but was told they could not take report at this time.

## 2020-01-27 ENCOUNTER — Inpatient Hospital Stay (HOSPITAL_COMMUNITY): Payer: Medicare Other

## 2020-01-27 DIAGNOSIS — I6389 Other cerebral infarction: Secondary | ICD-10-CM

## 2020-01-27 HISTORY — PX: TRANSTHORACIC ECHOCARDIOGRAM: SHX275

## 2020-01-27 LAB — MRSA PCR SCREENING: MRSA by PCR: NEGATIVE

## 2020-01-27 MED ORDER — HALOPERIDOL LACTATE 5 MG/ML IJ SOLN
1.0000 mg | Freq: Once | INTRAMUSCULAR | Status: AC
  Administered 2020-01-27: 1 mg via INTRAVENOUS
  Filled 2020-01-27: qty 1

## 2020-01-27 MED ORDER — LABETALOL HCL 5 MG/ML IV SOLN
5.0000 mg | INTRAVENOUS | Status: DC | PRN
  Administered 2020-01-27: 20 mg via INTRAVENOUS
  Filled 2020-01-27 (×2): qty 4

## 2020-01-27 MED ORDER — HYDROCODONE-ACETAMINOPHEN 5-325 MG PO TABS
1.0000 | ORAL_TABLET | Freq: Three times a day (TID) | ORAL | Status: DC | PRN
  Administered 2020-01-27: 1 via ORAL
  Filled 2020-01-27: qty 1

## 2020-01-27 MED ORDER — VERAPAMIL HCL ER 180 MG PO TBCR
180.0000 mg | EXTENDED_RELEASE_TABLET | Freq: Every day | ORAL | Status: DC
  Administered 2020-01-27 – 2020-01-29 (×3): 180 mg via ORAL
  Filled 2020-01-27 (×3): qty 1

## 2020-01-27 MED ORDER — MELOXICAM 7.5 MG PO TABS: 7.5000 mg | ORAL_TABLET | Freq: Every day | ORAL | Status: DC | PRN

## 2020-01-27 MED ORDER — LISINOPRIL 10 MG PO TABS
10.0000 mg | ORAL_TABLET | Freq: Every day | ORAL | Status: DC
  Administered 2020-01-27 – 2020-01-29 (×3): 10 mg via ORAL
  Filled 2020-01-27 (×3): qty 1

## 2020-01-27 MED ORDER — HYDROCODONE-ACETAMINOPHEN 5-325 MG PO TABS
1.0000 | ORAL_TABLET | ORAL | Status: DC | PRN
  Administered 2020-01-27 – 2020-01-28 (×3): 1 via ORAL
  Filled 2020-01-27 (×3): qty 1

## 2020-01-27 MED ORDER — LORATADINE 10 MG PO TABS
10.0000 mg | ORAL_TABLET | ORAL | Status: DC
  Administered 2020-01-27 – 2020-01-29 (×2): 10 mg via ORAL
  Filled 2020-01-27 (×2): qty 1

## 2020-01-27 MED ORDER — FLUTICASONE PROPIONATE 50 MCG/ACT NA SUSP
2.0000 | Freq: Every day | NASAL | Status: DC
  Administered 2020-01-28: 2 via NASAL
  Filled 2020-01-27: qty 16

## 2020-01-27 MED ORDER — GADOBUTROL 1 MMOL/ML IV SOLN
6.0000 mL | Freq: Once | INTRAVENOUS | Status: AC | PRN
  Administered 2020-01-27: 6 mL via INTRAVENOUS

## 2020-01-27 MED ORDER — HALOPERIDOL LACTATE 5 MG/ML IJ SOLN
1.0000 mg | Freq: Once | INTRAMUSCULAR | Status: AC
  Administered 2020-01-27: 1.5 mg via INTRAVENOUS
  Filled 2020-01-27: qty 1

## 2020-01-27 MED ORDER — BACLOFEN 10 MG PO TABS
5.0000 mg | ORAL_TABLET | Freq: Two times a day (BID) | ORAL | Status: DC
  Administered 2020-01-27 – 2020-01-29 (×5): 5 mg via ORAL
  Filled 2020-01-27 (×6): qty 1

## 2020-01-27 MED ORDER — PANTOPRAZOLE SODIUM 40 MG PO TBEC
40.0000 mg | DELAYED_RELEASE_TABLET | Freq: Every day | ORAL | Status: DC
  Administered 2020-01-27 – 2020-01-28 (×2): 40 mg via ORAL
  Filled 2020-01-27 (×2): qty 1

## 2020-01-27 NOTE — Evaluation (Addendum)
Speech Language Pathology Evaluation Patient Details Name: Alison Cuevas MRN: [id number] DOB: Jan 27, 1946 Today's Date: 01/27/2020 Time: 0912-0932 SLP Time Calculation (min) (ACUTE ONLY): 20 min  Problem List:  Patient Active Problem List   Diagnosis Date Noted  . ICH (intracerebral hemorrhage) ([place]) 01/26/2020  . Conductive hearing loss of right ear with unrestricted hearing of left ear 05/28/2019  . Hyponatremia 08/31/2017  . Generalized weakness 08/31/2017  . Dehydration 08/31/2017  . UTI (urinary tract infection) 08/31/2017  . Tympanic membrane rupture, right 12/01/2016  . Right ovarian cyst 08/22/2016  . Fibromyalgia 06/03/2015  . Cystocele 03/18/2014  . 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 Diverticulosis of colon without hemorrhage 06/12/2013  . Hypertension   . Osteopenia   . Chronic low back pain 05/12/2011  . HYPERCHOLESTEROLEMIA 03/17/2008  . Anxiety state 09/17/2007  . GERD 09/17/2007  . DEGENERATIVE JOINT DISEASE 09/17/2007   Past Medical History:  Past Medical History:  Diagnosis Date  . Allergic rhinitis    maple pollen  . Anxiety   . Arthritis   . Bowel obstruction ([place])   . Chronic low back pain    Dr. Trenton Gammon.  Has had back injection--bp elevated after.  . Chronic pain syndrome   . Chronic renal insufficiency, stage 2 (mild)    GFR 60-70  . DDD (degenerative disc disease), cervical    Hx of ACDF (Dr. [name]).  Followed by Dr. [name].  Also, Dr. [name] do left C7-T1 intralaminal epidural injection.  . Diverticulosis of colon   . DJD (degenerative joint disease)   . Gallstones   . GERD (gastroesophageal reflux disease)   . Herpes zoster 07/08/2014  . Hypercholesteremia    mild; pt declined statin trial 05/2014--needs recheck lipid panel at first f/u visit in 2017  . Hypertension   . Osteopenia 06/2017   T score -1.4 FRAX 15% / 2%  . Ovarian cyst 2017   82019-robotic assisted bilatel SPO: all path benign.  . Perianal dermatitis    prn  cutivate  . Phlebitis   . Right knee injury 2018   Patellofemoral crush injury--Dr. [name].  . Trochanteric bursitis of right hip    Recurrent (injection by Dr. [name] 05/26/15)  . Tympanic membrane rupture, right 12/01/2016   As of 08/2018 pt set for tympanomastoidectomy and STSG (Dr. [name]). Recurrent fungal/bact OE.  . Varicose vein    left leg    Past Surgical History:  Past Surgical History:  Procedure Laterality Date  . ABDOMINAL HYSTERECTOMY  03/2015   TAH.  Last pap 2016.  No hx of abnl paps.  Per GYN, no further pap smears indicated.  . ANTERIOR AND POSTERIOR REPAIR N/A 03/18/2014   Procedure: Cystocele repair with graft, Vault suspension, Rectocele repair;  Surgeon: [name], MD;  [facility];  Service: Urology;  Laterality: N/A;  . CHOLECYSTECTOMY    . CHOLECYSTECTOMY, LAPAROSCOPIC    . COLON SURGERY    . COLONOSCOPY  04/2006; 05/26/16   2007 (Dr. [name]): Normal.  04/2016 (Dr. [name]) normal except diverticulosis and decreased anal sphincter tone.  No repeat colonoscopy is recommended due to age.  . cspine surgery     Dr. Wiliam Ke level ant cerv discectomy /fusion w/plating  . CYSTO N/A 03/18/2014   Procedure: CYSTO;  Surgeon: [name], MD;  [facility];  Service: Urology;  Laterality: N/A;  . DEXA  07/30/2015; 06/2018   2017 and 2020 -->Osteopenia--repeat 2 yrs.  Marland Kitchen LYSIS OF ADHESION N/A 01/31/2018   Procedure: POSSIBLE LYSIS OF  ADHESION;  Surgeon: Everitt Amber, MD;  [facility];  Service: Gynecology;  Laterality: N/A;  . [place]     with prolasped bledder repair  . RESECTION OF COLON     BENIGN TUMOR  . RIGHT EAR SURGERY  08/23/2017   Dr. 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 right ear surgery  12/12/2018   Right tympanomastoidectomy, ossiculoplasty with partial prosthesis, split  thickness skin graft from postauricular skin 1x1cm, and right tragal cartilage graft 1x1 cm [facility])  . right hemicolectomy for diverticulitis with abscess  1993  . ROBOTIC ASSISTED BILATERAL SALPINGO OOPHERECTOMY Bilateral 01/31/2018   All PATH benign.  Procedure: XI ROBOTIC ASSISTED BILATERAL SALPINGO OOPHORECTOMY;  Surgeon: Everitt Amber, MD;  [facility];  Service: Gynecology;  Laterality: Bilateral;  . TUBAL LIGATION     HPI:  Pt is a 74 yo female presenting with acute onset R-sided numbness/weakness. 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 MRI pending. PMH includes: chronic low back pain, DDD, HTN, HLD, anxiety, GERD   Assessment / Plan / Recommendation Clinical Impression  Pt is drowsy and internally distracted by pain, needing Mod cues for redirection and sustained attention throughout testing. Pt was administered portions of the SLUMS, but demonstrating errors with orientation to time, calculations/working memory, divergent naming, and storage/retrieval of new information. Initially she is able to recall 2 out of 5 words on delayed recall task given Min-Mod cues including extra repetitions during storage. When given extra time and Min cues, she she is able to then recall 5 out of 5 words after a brief delay. Pt also exhibits decreased awareness of her R side with impaired intellectual awareness of acute R-sided deficits. Her speech is minimally slurred but intelligible at the conversational level, with occasional word-finding errors observed. Given her high level of independence PTA, she will benefit from SLP f/u acutely and at next level of care.    SLP Assessment  SLP Recommendation/Assessment: Patient needs continued Speech Lanaguage Pathology Services SLP Visit Diagnosis: Cognitive communication deficit (R41.841)    Follow Up  Recommendations  Inpatient Rehab    Frequency and Duration min 2x/week  2 weeks      SLP Evaluation Cognition  Overall Cognitive Status: Impaired/Different from baseline Arousal/Alertness:  (drowsy) Orientation Level: Oriented to person;Oriented to place;Oriented to situation;Disoriented to time Attention: Sustained Sustained Attention: Impaired Sustained Attention Impairment: Verbal basic Memory: Impaired Memory Impairment: Storage deficit;Retrieval deficit;Decreased recall of new information Awareness: Impaired Awareness Impairment: Intellectual impairment Problem Solving: Impaired Problem Solving Impairment: Verbal complex Safety/Judgment: Impaired       Comprehension  Auditory Comprehension Overall Auditory Comprehension: Appears within functional limits for tasks assessed (with basic instructions/questions)    Expression Expression Primary Mode of Expression: Verbal Verbal Expression Overall Verbal Expression: Impaired Initiation: No impairment Level of Generative/Spontaneous Verbalization: Conversation Naming: Impairment Divergent: 0-24% accurate Non-Verbal Means of Communication: Not applicable   Oral / Motor  Motor Speech Overall Motor Speech: Impaired Respiration: Within functional limits Phonation: Normal Resonance: Within functional limits Articulation: Impaired Level of Impairment: Conversation Intelligibility: Intelligible   GO                    Osie Bond., M.A. [place] Acute Environmental education officer [phone]  Office [phone]  01/27/2020, 9:43 AM

## 2020-01-27 NOTE — Progress Notes (Signed)
Pt came to MRI dept for Scan.  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   Daughter confirmed they were told she had metal rod in ear. Spoke with Dr Fernand Parkins) and it was decided to not continue with MRI at this time since there was no documentation of implant in op notes in chart.  RN present and aware scan was not done and she reached out to MD.

## 2020-01-27 NOTE — Progress Notes (Signed)
PT Cancellation Note  Patient Details Name: Alison Cuevas MRN: [id number] DOB: 12/19/1945   Cancelled Treatment:    Reason Eval/Treat Not Completed: Active bedrest order Will await increase in activity orders prior to PT evaluation. Will follow.   Marguarite Arbour A Hasina Kreager 01/27/2020, 7:04 AM Marisa Severin, PT, DPT Acute Rehabilitation Services Pager [phone] Office [phone]

## 2020-01-27 NOTE — Evaluation (Signed)
Physical Therapy Evaluation Patient Details Name: Alison Cuevas MRN: [id number] DOB: 04-14-1946 Today's Date: 01/27/2020   History of Present Illness  Alison Cuevas is a 74 y.o. female past medical history of chronic low back pain, degenerative disc disease, hypertension, hyperlipidemia, anxiety, presented to [facility] emergency room as an acute code stroke for sudden onset of right-sided weakness.Stat head CT done revealed a intraparenchymal hematoma centered at the left frontoparietal convexity of 14 cc volume with associated subdural extension with a small subdural hemorrhage overlying the left cerebral convexity measuring up to 4 mm in thickness  Clinical Impression  Patient presents with right hemiplegia, increased tone RUE/LE, impaired balance, right inattention and impaired mobility s/p above. Pt lives at home with spouse and is independent for ADls, IADLs and ambulation PTA. Today, pt requires max A of 2 for bed mobility, sitting balance and transfers. Able to perform SPT towards right with Max A of 2. Noted to have sustained clonus at right ankle. Noted to have some pushing tendencies with LUE. Would benefit from CIR to maximize independence and mobility prior to return home. Will follow acutely.    Follow Up Recommendations CIR;Supervision/Assistance - 24 hour;Supervision for mobility/OOB    Equipment Recommendations  Other (comment) (TBA)    Recommendations for Other Services Rehab consult     Precautions / Restrictions Precautions Precautions: Fall Precaution Comments: Increased tone RUE/LE Restrictions Weight Bearing Restrictions: No      Mobility  Bed Mobility Overal bed mobility: Needs Assistance Bed Mobility: Rolling;Supine to Sit Rolling: Total assist;+2 for physical assistance   Supine to sit: Total assist;+2 for physical assistance     General bed mobility comments: Pt with increased tone in RUE/RLE. RLE tone broke once we sat her up at EOB, with RUE staying with  elbow flexed  Transfers Overall transfer level: Needs assistance Equipment used: 2 person hand held assist Transfers: Sit to/from [facility] Sit to Stand: Max assist;+2 physical assistance Stand pivot transfers: Max assist;+2 physical assistance       General transfer comment: Able to come up to partial stand for RN to A with back peri care, stand pivot with gait belt and bed pad from bed to recliner (going to pt's right).  Ambulation/Gait             General Gait Details: Unable  Stairs            Wheelchair Mobility    Modified Rankin (Stroke Patients Only) Modified Rankin (Stroke Patients Only) Pre-Morbid Rankin Score: No symptoms Modified Rankin: Severe disability     Balance Overall balance assessment: Needs assistance Sitting-balance support: Single extremity supported;Feet supported Sitting balance-Leahy Scale: Zero Sitting balance - Comments: Pt requires Max A sitting EOB due to intermitent pushing with LUE (posterior and Right lateral lean)   Standing balance support: Single extremity supported Standing balance-Leahy Scale: Poor Standing balance comment: External support of 2 for standing; sustained clonus RLE                             Pertinent Vitals/Pain Pain Assessment: No/denies pain    Home Living Family/patient expects to be discharged to:: Inpatient rehab Living Arrangements: Spouse/significant other Available Help at Discharge: Family;Available 24 hours/day Type of Home: House Home Access: Stairs to enter Entrance Stairs-Rails: Right;Left;Can reach both Entrance Stairs-Number of Steps: 3 Home Layout: One level Home Equipment: Shower seat;Bedside commode;Walker - 2 wheels;Walker - 4 wheels;Cane - single point Additional Comments: husband is  not in good health, pt needs to be able to take care of herself    Prior Function Level of Independence: Independent         Comments: Out in the yard putting out  feed for the deer when this occurred, does IADLs, drives.     Hand Dominance   Dominant Hand: Right    Extremity/Trunk Assessment   Upper Extremity Assessment Upper Extremity Assessment: Defer to OT evaluation RUE Deficits / Details: Flexion tone, reflexive grip with palm touched RUE Coordination: decreased fine motor;decreased gross motor    Lower Extremity Assessment Lower Extremity Assessment: RLE deficits/detail RLE Deficits / Details: Extensor tone, able to break with hip flexion and sitting EOB; sustained clonus. Limited DF AROM. RLE Sensation: decreased light touch RLE Coordination: decreased fine motor;decreased gross motor    Cervical / Trunk Assessment Cervical / Trunk Assessment: Other exceptions Cervical / Trunk Exceptions: wearing collar as she does at home for comfort.  Communication   Communication: No difficulties  Cognition Arousal/Alertness: Awake/alert Behavior During Therapy: Flat affect Overall Cognitive Status: Impaired/Different from baseline Area of Impairment: Following commands;Safety/judgement;Problem solving                       Following Commands: Follows one step commands inconsistently Safety/Judgement: Decreased awareness of safety;Decreased awareness of deficits   Problem Solving: Slow processing;Decreased initiation;Difficulty sequencing;Requires verbal cues;Requires tactile cues General Comments: Right inattention, right visual field deficits likely      General Comments General comments (skin integrity, edema, etc.): BP stable and in parameters throughout    Exercises     Assessment/Plan    PT Assessment Patient needs continued PT services  PT Problem List Decreased strength;Decreased mobility;Decreased safety awareness;Impaired tone;Decreased coordination;Decreased range of motion;Decreased activity tolerance;Decreased cognition;Impaired sensation;Decreased balance;Decreased knowledge of use of DME       PT Treatment  Interventions Therapeutic activities;Gait training;Therapeutic exercise;Patient/family education;DME instruction;Cognitive remediation;Balance training;Functional mobility training;Neuromuscular re-education;Manual techniques    PT Goals (Current goals can be found in the Care Plan section)  Acute Rehab PT Goals Patient Stated Goal: to get better PT Goal Formulation: With patient Time For Goal Achievement: 02/10/20 Potential to Achieve Goals: Fair    Frequency Min 4X/week   Barriers to discharge Decreased caregiver support      Co-evaluation PT/OT/SLP Co-Evaluation/Treatment: Yes Reason for Co-Treatment: For patient/therapist safety;To address functional/ADL transfers PT goals addressed during session: Mobility/safety with mobility;Balance;Strengthening/ROM OT goals addressed during session: Strengthening/ROM;ADL's and self-care       AM-PAC PT "6 Clicks" Mobility  Outcome Measure Help needed turning from your back to your side while in a flat bed without using bedrails?: A Lot Help needed moving from lying on your back to sitting on the side of a flat bed without using bedrails?: Total Help needed moving to and from a bed to a chair (including a wheelchair)?: Total Help needed standing up from a chair using your arms (e.g., wheelchair or bedside chair)?: Total Help needed to walk in hospital room?: Total Help needed climbing 3-5 steps with a railing? : Total 6 Click Score: 7    End of Session   Activity Tolerance: Patient tolerated treatment well Patient left: in chair;with call bell/phone within reach;with nursing/sitter in room;with chair alarm set Nurse Communication: Mobility status;Need for lift equipment PT Visit Diagnosis: Hemiplegia and hemiparesis;Unsteadiness on feet (R26.81);Difficulty in walking, not elsewhere classified (R26.2) Hemiplegia - Right/Left: Right Hemiplegia - dominant/non-dominant: Non-dominant    Time: 6962-9528 PT Time Calculation (min) (ACUTE  ONLY):  30 min   Charges:   PT Evaluation $PT Eval Moderate Complexity: 1 Mod          Marisa Severin, PT, DPT Acute Rehabilitation Services Pager [phone] Office [phone]      Marguarite Arbour A Sabra Heck 01/27/2020, 3:39 PM

## 2020-01-27 NOTE — Plan of Care (Signed)
  Problem: Education: Goal: Knowledge of disease or condition will improve 01/27/2020 0713 by Richardson Chiquito, RN Outcome: Progressing 01/27/2020 0713 by Richardson Chiquito, RN Outcome: Progressing
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
## 2020-01-27 NOTE — Evaluation (Signed)
Occupational Therapy Evaluation Patient Details Name: Alison Cuevas MRN: [id number] DOB: 08/03/1945 Today's Date: 01/27/2020    History of Present Illness Alison Cuevas is a 74 y.o. female past medical history of chronic low back pain, degenerative disc disease, hypertension, hyperlipidemia, anxiety, presented to [facility] emergency room as an acute code stroke for sudden onset of right-sided weakness.Stat head CT done revealed a intraparenchymal hematoma centered at the left frontoparietal convexity of 14 cc volume with associated subdural extension with a small subdural hemorrhage overlying the left cerebral convexity measuring up to 4 mm in thickness   Clinical Impression   This 74 yo female admitted with above presents to acute OT with PLOF of being totally independent with basic ADLs, IADLs, driving, walking around in yard feeding deer. Currently pt is max-total A for all mobility and Mod-total A for all basic ADLs. Pt with increased tone in right side, decreased vision on right, and decreased sense of midline. Pt will benefit from acute OT with follow up on CIR.    Follow Up Recommendations  CIR;Supervision/Assistance - 24 hour    Equipment Recommendations  Other (comment) (TBD next venue)    Recommendations for Other Services Rehab consult     Precautions / Restrictions Precautions Precautions: Fall Precaution Comments: High tone on right side (able to break tone in RLE with sitting EOB) Restrictions Weight Bearing Restrictions: No      Mobility Bed Mobility Overal bed mobility: Needs Assistance Bed Mobility: Rolling;Supine to Sit Rolling: Total assist;+2 for physical assistance (to right)   Supine to sit: Total assist;+2 for physical assistance     General bed mobility comments: Pt with increased tone in RUE/RLE. RLE tone broke once we sat her up at EOB, with RUE staying with elbow flexed  Transfers Overall transfer level: Needs assistance Equipment used: 2 person  hand held assist Transfers: Sit to/from [employer] Transfers Sit to Stand: Max assist;+2 physical assistance Stand pivot transfers: Max assist;+2 physical assistance       General transfer comment: Able to come up to partial stand for RN to A with back peri care, stand pivot with gait belt and bed pad from bed to recliner (going to pt's right)    Balance Overall balance assessment: Needs assistance Sitting-balance support: Single extremity supported;Feet supported Sitting balance-Leahy Scale: Zero Sitting balance - Comments: Pt requires Max A sitting EOB due to intermitent pushing with LUE (posterior and Right lateral lean)   Standing balance support: Single extremity supported Standing balance-Leahy Scale: Poor                             ADL either performed or assessed with clinical judgement   ADL Overall ADL's : Needs assistance/impaired Eating/Feeding: Moderate assistance;Bed level   Grooming: Wash/dry face;Bed level;Set up;Supervision/safety   Upper Body Bathing: Maximal assistance;Bed level   Lower Body Bathing: Total assistance;Bed level   Upper Body Dressing : Total assistance;Bed level   Lower Body Dressing: Total assistance;Bed level   Toilet Transfer: Maximal assistance;+2 for physical assistance;Stand-pivot Toilet Transfer Details (indicate cue type and reason): Bed>recliner going to her right Toileting- Clothing Manipulation and Hygiene: Total assistance Toileting - Clothing Manipulation Details (indicate cue type and reason): Max A +2 sit<>stand at EOB             Vision Patient Visual Report:  (reports her vision is different) Vision Assessment?: Yes Eye Alignment: Within Functional Limits Ocular Range of Motion: Within Functional Limits Alignment/Gaze  Preference: Within Defined Limits Tracking/Visual Pursuits: Decreased smoothness of horizontal tracking (to right) Visual Fields:  (right side issues (visual field cut +/-  inattention))            Pertinent Vitals/Pain Pain Assessment: No/denies pain     Hand Dominance Right   Extremity/Trunk Assessment Upper Extremity Assessment Upper Extremity Assessment: RUE deficits/detail RUE Deficits / Details: Flexion tone, reflexive grip with palm touched RUE Coordination: decreased fine motor;decreased gross motor           Communication Communication Communication: No difficulties   Cognition Arousal/Alertness: Awake/alert (a little drowsey (suspect due to medications)) Behavior During Therapy: Flat affect Overall Cognitive Status: Impaired/Different from baseline Area of Impairment: Following commands;Safety/judgement;Problem solving                       Following Commands: Follows one step commands inconsistently Safety/Judgement: Decreased awareness of safety;Decreased awareness of deficits   Problem Solving: Slow processing;Decreased initiation;Difficulty sequencing;Requires verbal cues;Requires tactile cues                Home Living Family/patient expects to be discharged to:: Inpatient rehab Living Arrangements: Spouse/significant other Available Help at Discharge: Family;Available 24 hours/day Type of Home: House Home Access: Stairs to enter [employer] of Steps: 3 Entrance Stairs-Rails: Right;Left;Can reach both Home Layout: One level               Home Equipment: Shower seat;Bedside commode;Walker - 2 wheels;Walker - 4 wheels;Cane - single point   Additional Comments: husband is not in good health, pt needs to be able to take care of herself  Lives With: Spouse    Prior Functioning/Environment Level of Independence: Independent        Comments: Out in the yard putting out feed for the deer when this occurred        OT Problem List: Decreased strength;Decreased range of motion;Impaired balance (sitting and/or standing);Impaired vision/perception;Impaired UE functional use;Decreased  cognition;Decreased coordination;Impaired sensation;Impaired tone      OT Treatment/Interventions: Self-care/ADL training;Energy conservation;Visual/perceptual remediation/compensation;Patient/family education;DME and/or AE instruction;Balance training;Therapeutic exercise;Therapeutic activities;Cognitive remediation/compensation;Neuromuscular education    OT Goals(Current goals can be found in the care plan section) Acute Rehab OT Goals Patient Stated Goal: to get better OT Goal Formulation: With patient Time For Goal Achievement: 02/10/20 Potential to Achieve Goals: Good  OT Frequency: Min 2X/week   Barriers to D/C: Decreased caregiver support          Co-evaluation PT/OT/SLP Co-Evaluation/Treatment: Yes Reason for Co-Treatment: For patient/therapist safety;To address functional/ADL transfers (high tone on right side) PT goals addressed during session: Mobility/safety with mobility;Balance;Strengthening/ROM OT goals addressed during session: Strengthening/ROM;ADL's and self-care      AM-PAC OT "6 Clicks" Daily Activity     Outcome Measure Help from another person eating meals?: A Lot Help from another person taking care of personal grooming?: A Lot Help from another person toileting, which includes using toliet, bedpan, or urinal?: Total Help from another person bathing (including washing, rinsing, drying)?: A Lot Help from another person to put on and taking off regular upper body clothing?: Total Help from another person to put on and taking off regular lower body clothing?: Total 6 Click Score: 9   End of Session Equipment Utilized During Treatment: Gait belt Nurse Communication: Need for lift equipment (RN in room A'ing [place] with mobility)  Activity Tolerance: Patient tolerated treatment well Patient left: in chair;with call bell/phone within reach;with chair alarm set  OT Visit Diagnosis: Unsteadiness on feet (R26.81);Other  abnormalities of gait and mobility  (R26.89);Muscle weakness (generalized) (M62.81);Low vision, both eyes (H54.2);Other symptoms and signs involving cognitive function                Time: 6815-9470 OT Time Calculation (min): 31 min Charges:  OT General Charges $OT Visit: 1 Visit OT Evaluation $OT Eval Moderate Complexity: 1 Mod  [place] [address], OTR/L Acute [employer] Pager [phone] Office [phone]     Almon Register 01/27/2020, 2:49 PM

## 2020-01-27 NOTE — Progress Notes (Signed)
  Echocardiogram 2D Echocardiogram has been performed.  Alison Cuevas Alison Cuevas 01/27/2020, 4:16 PM

## 2020-01-27 NOTE — Progress Notes (Signed)
Inpatient Rehab Admissions Coordinator Note:   Per therapy recommendations, pt was screened for CIR candidacy by Shann Medal, PT, DPT.  At this time we are recommending a CIR consult and I will place an order per our protocol.  Please contact me with questions.   Shann Medal, PT, DPT [phone] 01/27/20 5:20 PM

## 2020-01-27 NOTE — Progress Notes (Addendum)
Patient taken down to MRI; pt had prior surgery to ear-- unknown if metal placed. Radiologist called by MRI team; MRI cancelled at this time. SMiachel Roux made aware, no new orders at this time.   Family found MRI compatible card--MD Leonie Man made aware/confirmed. MRI called [phone] to see if MRI can happen--tech said they were very back up due to scheduled outpatient and stat ED.  Will call back when available.    1800 family now have pictures of back of card; MRI team made aware/sent pictures securely.

## 2020-01-27 NOTE — Progress Notes (Signed)
Orthopedic Tech Progress Note Patient Details:  Alison Cuevas Dec 30, 1945 [id number] RN stated patient was in MRI so I left SOFT COLLAR with the buddy RN Ortho Devices Type of Ortho Device: Soft collar Ortho Device/Splint Location: neck Ortho Device/Splint Interventions: Other (comment)   Post Interventions Patient Tolerated: Other (comment) Instructions Provided: Other (comment)   [name] 01/27/2020, 11:19 AM

## 2020-01-28 ENCOUNTER — Inpatient Hospital Stay (HOSPITAL_COMMUNITY): Payer: Medicare Other

## 2020-01-28 DIAGNOSIS — I610 Nontraumatic intracerebral hemorrhage in hemisphere, subcortical: Secondary | ICD-10-CM

## 2020-01-28 DIAGNOSIS — R569 Unspecified convulsions: Secondary | ICD-10-CM

## 2020-01-28 LAB — HEMOGLOBIN A1C
Hgb A1c MFr Bld: 5 % (ref 4.8–5.6)
Mean Plasma Glucose: 96.8 mg/dL

## 2020-01-28 LAB — LIPID PANEL
Cholesterol: 163 mg/dL (ref 0–200)
HDL: 75 mg/dL (ref >40)
LDL Cholesterol: 81 mg/dL (ref 0–99)
Total CHOL/HDL Ratio: 2.2 RATIO
Triglycerides: 33 mg/dL (ref <150)
VLDL: 7 mg/dL (ref 0–40)

## 2020-01-28 LAB — ECHOCARDIOGRAM COMPLETE
Height: 60 in
S' Lateral: 2.1 cm
Weight: 2080 oz

## 2020-01-28 MED ORDER — HYDROCODONE-ACETAMINOPHEN 5-325 MG PO TABS
1.0000 | ORAL_TABLET | Freq: Four times a day (QID) | ORAL | Status: DC | PRN
  Administered 2020-01-28 – 2020-01-29 (×3): 1 via ORAL
  Filled 2020-01-28 (×3): qty 1

## 2020-01-28 NOTE — Consult Note (Signed)
Physical Medicine and Rehabilitation Consult  Reason for Consult: Functional deficits due to [place]. Referring Physician: Dr. Leonie Man  HPI: Alison Cuevas is a 74 y.o. female with history of HTN, anxiety d/o, DDD w/chronic pain who was admitted via [facility] with sudden onset of right-sided weakness with numbness that developed while she was working in her garden.  CT of head done showing IPH left frontoparietal convexity with subdural extension and 4 mm left-to-right midline shift.  She was transferred to [facility] for management and Cleviprex added for BP >140.  CTA head neck was negative for LVO or significant stenosis or AVM.  CT venogram was negative for dural sinus thrombosis.  UDS negative for opiates and positive for benzos.  Dr. Leonie Man felt that bleed was hypertensive in nature--ASA discontinued.  Patient has had issues with right lower extremity pain and spasms due to increase in tone.  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 2D echo showed EF of 65 to 70% with normal LVEF and no wall abnormality.  Therapy evaluations completed yesterday revealing impairments due to right inattention, right visual deficits, delay in processing,  right hemiplegia with tone as well as pusher tendencies with posterior/right lateral lean affecting ADLs and mobility.  CIR recommended due to functional deficits.  Review of Systems  Constitutional: Negative for chills and fever.  HENT: Positive for hearing loss (right ear with tendency to have fungal infections.  HAS TO BE COVERED FOR bathing. ). Negative for tinnitus.   Eyes: Negative for blurred vision and double vision.  Respiratory: Negative for cough, shortness of breath and stridor.   Cardiovascular: Negative for chest pain, palpitations and leg swelling.  Gastrointestinal: Positive for heartburn. Negative for abdominal  pain and nausea.  Genitourinary:       Tends to have frequent UTI.  Incontience  Musculoskeletal: Positive for myalgias and neck pain.  Neurological: Positive for sensory change, speech change and weakness.  Psychiatric/Behavioral: Positive for memory loss.     Past Medical History:  Diagnosis Date  . Allergic rhinitis    maple pollen  . Anxiety   . Arthritis   . Bowel obstruction ([place])   . Chronic low back pain    Dr. Trenton Gammon.  Has had back injection--bp elevated after.  . Chronic pain syndrome   . Chronic renal insufficiency, stage 2 (mild)    GFR 60-70  . DDD (degenerative disc disease), cervical    Hx of ACDF (Dr. [name]).  Followed by Dr. [name].  Also, Dr. [name] do left C7-T1 intralaminal epidural injection.  . Diverticulosis of colon   . DJD (degenerative joint disease)   . Gallstones   . GERD (gastroesophageal reflux disease)   . Herpes zoster 07/08/2014  . Hypercholesteremia    mild; pt declined statin trial 05/2014--needs recheck lipid panel at first f/u visit in 2017  . Hypertension   . Osteopenia 06/2017   T score -1.4 FRAX 15% / 2%  . Ovarian cyst 2017   [id number] assisted bilatel SPO: all path benign.  . Perianal dermatitis    prn cutivate  . Phlebitis   . Right knee injury 2018   Patellofemoral crush injury--Dr. [name].  . Trochanteric bursitis of right hip    Recurrent (injection by Dr. [name] 05/26/15)  . Tympanic membrane rupture, right 12/01/2016   As of 08/2018 pt set for tympanomastoidectomy and STSG (Dr.  Gandolfi). Recurrent fungal/bact OE.  . Varicose vein    left leg     Past Surgical History:  Procedure Laterality Date  . ABDOMINAL HYSTERECTOMY  03/2015   TAH.  Last pap 2016.  No hx of abnl paps.  Per GYN, no further pap smears indicated.  . ANTERIOR AND POSTERIOR REPAIR N/A 03/18/2014   Procedure: Cystocele repair with graft, Vault suspension, Rectocele repair;  Surgeon: [name], MD;  Location: [facility];   Service: Urology;  Laterality: N/A;  . CHOLECYSTECTOMY    . CHOLECYSTECTOMY, LAPAROSCOPIC    . COLON SURGERY    . COLONOSCOPY  04/2006; 05/26/16   2007 (Dr. [name]): Normal.  04/2016 (Dr. [name]) normal except diverticulosis and decreased anal sphincter tone.  No repeat colonoscopy is recommended due to age.  . cspine surgery     Dr. Wiliam Ke level ant cerv discectomy /fusion w/plating  . CYSTO N/A 03/18/2014   Procedure: CYSTO;  Surgeon: [name], MD;  Location: [facility];  Service: Urology;  Laterality: N/A;  . DEXA  07/30/2015; 06/2018   2017 and 2020 -->Osteopenia--repeat 2 yrs.  Marland Kitchen LYSIS OF ADHESION N/A 01/31/2018   Procedure: POSSIBLE LYSIS OF ADHESION;  Surgeon: Everitt Amber, MD;  Location: [facility];  Service: Gynecology;  Laterality: N/A;  . [place]     with prolasped bledder repair  . RESECTION OF COLON     BENIGN TUMOR  . RIGHT EAR SURGERY  08/23/2017   Dr. 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 right ear surgery  12/12/2018   Right tympanomastoidectomy, ossiculoplasty with partial prosthesis, split thickness skin graft from postauricular skin 1x1cm, and right tragal cartilage graft 1x1 cm [facility])  . right hemicolectomy for diverticulitis with abscess  1993  . ROBOTIC ASSISTED BILATERAL SALPINGO OOPHERECTOMY Bilateral 01/31/2018   All PATH benign.  Procedure: XI ROBOTIC ASSISTED BILATERAL SALPINGO OOPHORECTOMY;  Surgeon: Everitt Amber, MD;  Location: [facility];  Service: Gynecology;  Laterality: Bilateral;  . TUBAL LIGATION      Family History  Problem Relation Age of Onset  . Hypertension Mother   . Diverticulitis Mother   . Breast cancer Sister 39  . Melanoma Brother   . Prostate cancer Brother   . Leukemia Brother   . Lupus Sister   . Lung disease Brother   . Stomach cancer Father   . Other Other        Family member with  MGUS    Social History:  Married. Independent and drives/manages home. 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 Husband independent but unable to provide assistance. Family planning on providing assistance after d/c? She reports that she has never smoked. She has never used smokeless tobacco. She reports that she does not drink alcohol and does not use drugs.    Allergies  Allergen Reactions  . Gabapentin Other (See Comments)    Elevated heart rate and crazy dreams  . Prednisone Other (See Comments)    REACTION: funny feeling    Medications Prior to Admission  Medication Sig Dispense Refill  . ALPRAZolam (XANAX) 0.5 MG tablet TAKE 1 TABLET BY MOUTH THREE TIMES A DAY AS NEEDED FOR ANXIETY (Patient taking differently: Take 0.5 mg by mouth as needed for anxiety. ) 90 tablet 5  . aspirin (ASPIRIN 81) 81 MG EC tablet Take 81 mg by mouth daily.     Marland Kitchen  Biotin 10000 MCG TABS Take 10,000 mcg by mouth daily.     . calcium carbonate (OSCAL) 1500 (600 Ca) MG TABS tablet Take 600 mg of elemental calcium by mouth daily with breakfast.    . Carboxymethylcellul-Glycerin (LUBRICATING EYE DROPS OP) Place 1 drop into both eyes daily as needed (dry eyes).    . Cholecalciferol (VITAMIN D3) 5000 units CAPS Take 5,000 Units by mouth daily.    . famotidine (PEPCID) 40 MG tablet TAKE 1 TABLET BY MOUTH TWICE A DAY (Patient taking differently: Take 40 mg by mouth as needed for heartburn or indigestion. ) 180 tablet 1  . fluticasone (FLONASE) 50 MCG/ACT nasal spray Place 2 sprays into both nostrils at bedtime. 48 g 3  . Lactobacillus-Inulin (CULTURELLE DIGESTIVE HEALTH PO) Take 1 tablet by mouth daily as needed (upset stomach).     . lisinopril (ZESTRIL) 10 MG tablet TAKE 1 TABLET BY MOUTH EVERY DAY (Patient taking differently: Take 10 mg by mouth daily. ) 90 tablet 1  . loratadine (CLARITIN) 10 MG tablet Take 10 mg by mouth every other day.     . meclizine (ANTIVERT) 12.5 MG tablet 1-2 tabs  po tid prn (Patient taking differently: Take 12.5 mg by mouth as needed for dizziness. ) 60 tablet 1  . meloxicam (MOBIC) 15 MG tablet TAKE 1 TABLET BY MOUTH EVERY DAY WITH FOOD AS NEEDED FOR MUSCULOSKELETAL PAIN (Patient taking differently: Take 15 mg by mouth as needed for pain (musculoskeletal pain). ) 30 tablet 6  . Multiple Minerals-Vitamins (CALCIUM-MAGNESIUM-ZINC-D3 PO) Take 1 tablet by mouth daily.     . Multiple Vitamins-Minerals (MULTIVITAMIN ADULT) TABS Take by mouth daily.    . ondansetron (ZOFRAN) 4 MG tablet Take 1 tablet (4 mg total) by mouth every 8 (eight) hours as needed for nausea or vomiting. (Patient taking differently: Take 4 mg by mouth as needed for nausea or vomiting. ) 30 tablet 1  . POTASSIUM PO Take 1 tablet by mouth daily.     Marland Kitchen PREVIDENT 5000 BOOSTER PLUS 1.1 % PSTE Place 1 strip onto teeth 3 (three) times daily. After use, spit out but do not rinse.  6  . Simethicone (GAS-X PO) Take 1 tablet by mouth daily as needed (gas).    . verapamil (CALAN-SR) 180 MG CR tablet 1 tab po qd (Patient taking differently: Take 180 mg by mouth daily. ) 90 tablet 3  . vitamin B-12 (CYANOCOBALAMIN) 1000 MCG tablet Take 2,000 mcg by mouth daily.    . fluticasone (CUTIVATE) 0.05 % cream Apply to affected area bid prn (Patient not taking: Reported on 09/25/2019) 30 g 1  . HYDROcodone-acetaminophen (NORCO/VICODIN) 5-325 MG tablet 1 tab po tid prn pain (Patient not taking: Reported on 01/27/2020) 90 tablet 0  . triamcinolone ointment (KENALOG) 0.1 % Apply 1 application topically 2 (two) times daily. (Patient not taking: Reported on 01/01/2020) 30 g 1    Home: Home Living Family/patient expects to be discharged to:: Inpatient rehab Living Arrangements: Spouse/significant other Available Help at Discharge: Family, Available 24 hours/day Type of Home: House Home Access: Stairs to enter [employer] of Steps: 3 Entrance Stairs-Rails: Right, Left, Can reach both Home Layout: One  level Home Equipment: Shower seat, Bedside commode, Environmental consultant - 2 wheels, Walker - 4 wheels, [place] - single point Additional Comments: husband is not in good health, pt needs to be able to take care of herself  Lives With: Spouse  Functional History: Prior Function Level of Independence: Independent Comments: Out in the  yard putting out feed for the deer when this occurred, does IADLs, drives. Functional Status:  Mobility: Bed Mobility Overal bed mobility: Needs Assistance Bed Mobility: Rolling, Supine to Sit Rolling: Total assist, +2 for physical assistance Supine to sit: Total assist, +2 for physical assistance General bed mobility comments: Pt with increased tone in RUE/RLE. RLE tone broke once we sat her up at EOB, with RUE staying with elbow flexed Transfers Overall transfer level: Needs assistance Equipment used: 2 person hand held assist Transfers: Sit to/from Stand, Stand Pivot Transfers Sit to Stand: Max assist, +2 physical assistance Stand pivot transfers: Max assist, +2 physical assistance General transfer comment: Able to come up to partial stand for RN to A with back peri care, stand pivot with gait belt and bed pad from bed to recliner (going to pt's right). Ambulation/Gait General Gait Details: Unable    ADL: ADL Overall ADL's : Needs assistance/impaired Eating/Feeding: Moderate assistance, Bed level Grooming: Wash/dry face, Bed level, Set up, Supervision/safety Upper Body Bathing: Maximal assistance, Bed level Lower Body Bathing: Total assistance, Bed level Upper Body Dressing : Total assistance, Bed level Lower Body Dressing: Total assistance, Bed level Toilet Transfer: Maximal assistance, +2 for physical assistance, Stand-pivot Toilet Transfer Details (indicate cue type and reason): Bed>recliner going to her right ToiletingRunner, broadcasting/film/video and Hygiene: Total assistance Toileting - Clothing Manipulation Details (indicate cue type and reason): Max A +2  sit<>stand at EOB  Cognition: Cognition Overall Cognitive Status: Impaired/Different from baseline Arousal/Alertness:  (drowsy) Orientation Level: Oriented to person, Disoriented to place, Disoriented to time, Disoriented to situation Attention: Sustained Sustained Attention: Impaired Sustained Attention Impairment: Verbal basic Memory: Impaired Memory Impairment: Storage deficit, Retrieval deficit, Decreased recall of new information Awareness: Impaired Awareness Impairment: Intellectual impairment Problem Solving: Impaired Problem Solving Impairment: Verbal complex Safety/Judgment: Impaired Cognition Arousal/Alertness: Awake/alert Behavior During Therapy: Flat affect Overall Cognitive Status: Impaired/Different from baseline Area of Impairment: Following commands, Safety/judgement, Problem solving Following Commands: Follows one step commands inconsistently Safety/Judgement: Decreased awareness of safety, Decreased awareness of deficits Problem Solving: Slow processing, Decreased initiation, Difficulty sequencing, Requires verbal cues, Requires tactile cues General Comments: Right inattention, right visual field deficits likely   Blood pressure 138/65, pulse 82, temperature 97.9 F (36.6 C), temperature source Oral, resp. rate 15, height 5' (1.524 m), weight 59 kg, last menstrual period 06/27/1972, SpO2 94 %. Physical Exam  General: Alert and oriented x 2, No apparent distress HEENT: Head is normocephalic, atraumatic, PERRLA, EOMI, sclera anicteric, oral mucosa pink and moist, dentition intact, ext ear canals clear,  Neck: Supple without JVD or lymphadenopathy Heart: Reg rate and rhythm. No murmurs rubs or gallops Chest: CTA bilaterally without wheezes, rales, or rhonchi; no distress Abdomen: Soft, non-tender, non-distended, bowel sounds positive. Extremities: No clubbing, cyanosis, or edema. Pulses are 2+ Skin: Clean and intact without signs of breakdown Neuro: Right facial  weakness without dysarthria. Left gaze preference but able to turn eyes to right field with max cues. Right visual field deficits. Sedated and fatigued appearing. Oriented to self --place and situation with minimal cues. Needed mod cues to recall other biograpic answers. She was able to follow simple one step motor commands with visual/tactile cues. Right hemiparesis with sensory deficits and extensor tone RLE. 0/5 on right side, 4/5 left side.   Musculoskeletal: Full ROM, No pain with AROM or PROM in the neck, trunk, or extremities. Posture appropriate Psych: Pt's affect is lethargic. Pt is cooperative   Results for orders placed or performed during the hospital encounter  of 01/26/20 (from the past 24 hour(s))  Lipid panel     Status: None   Collection Time: 01/28/20  3:41 AM  Result Value Ref Range   Cholesterol 163 0 - 200 mg/dL   Triglycerides 33 <150 mg/dL   HDL 75 >40 mg/dL   Total CHOL/HDL Ratio 2.2 RATIO   VLDL 7 0 - 40 mg/dL   LDL Cholesterol 81 0 - 99 mg/dL  Hemoglobin A1c     Status: None   Collection Time: 01/28/20  3:41 AM  Result Value Ref Range   Hgb A1c MFr Bld 5.0 4.8 - 5.6 %   Mean Plasma Glucose 96.8 mg/dL   CT Angio Head W or Wo Contrast  Result Date: 01/26/2020 CLINICAL DATA:  Initial evaluation for acute intracranial hemorrhage. EXAM: CT ANGIOGRAPHY HEAD AND NECK CT VENOGRAM HEAD TECHNIQUE: Multidetector CT imaging of the head and neck was performed using the standard protocol during bolus administration of intravenous contrast. Multiplanar CT image reconstructions and MIPs were obtained to evaluate the vascular anatomy. Carotid stenosis measurements (when applicable) are obtained utilizing NASCET criteria, using the distal internal carotid diameter as the denominator. CONTRAST:  84mL OMNIPAQUE IOHEXOL 350 MG/ML SOLN COMPARISON:  Prior CT from earlier the same day. FINDINGS: CTA NECK FINDINGS Aortic arch: Visualized aortic arch of normal caliber with normal 3 vessel  morphology. Mild atheromatous change within the arch itself. No hemodynamically significant stenosis seen about the origin of the great vessels. Right carotid system: Right common carotid artery widely patent from its origin to the bifurcation. No significant atheromatous stenosis about the right bifurcation. Right ICA widely patent distally to the skull base without stenosis, dissection or occlusion. Left carotid system: Left common carotid artery widely patent from its origin to the bifurcation without stenosis. Minimal calcified plaque about the left bifurcation without stenosis. Left ICA widely patent distally to the skull base without stenosis, dissection or occlusion. Vertebral arteries: Both vertebral arteries arise from the subclavian arteries. No proximal subclavian artery stenosis. Left vertebral artery dominant. Vertebral arteries patent within the neck without stenosis, dissection or occlusion. Skeleton: No acute osseous abnormality. No discrete lytic or blastic osseous lesions. Prior ACDF at C3-C6 without adverse features. Other neck: No other acute soft tissue abnormality within the neck. Chronic left sphenoid sinusitis noted. No mass lesion or adenopathy. 8 mm right thyroid nodule noted, felt to be of doubtful significance given size and patient age. No follow-up imaging recommended regarding this lesion. Upper chest: Visualized upper chest demonstrates no acute finding. Review of the MIP images confirms the above findings CTA HEAD FINDINGS Anterior circulation: Petrous segments widely patent bilaterally. Mild atheromatous change seen within the cavernous/supraclinoid ICAs without stenosis or other abnormality. ICA termini well perfused. Right A1 widely patent. Left A1 hypoplastic but patent as well. Normal anterior communicating artery complex. Anterior cerebral arteries widely patent to their distal aspects without stenosis. No M1 stenosis or occlusion. Normal MCA bifurcations. Distal MCA branches  well perfused and symmetric. Posterior circulation: Vertebral arteries widely patent to the vertebrobasilar junction without stenosis. Left vertebral artery dominant. Patent right PICA. Left PICA not definitely seen. Basilar widely patent to its distal aspect without stenosis. Superior cerebral arteries patent bilaterally. Right PCA supplied via the basilar. Fetal type origin left PCA. Both PCAs well perfused to their distal aspects without stenosis. No intracranial aneurysm. No vascular abnormality seen underlying the left frontoparietal hemorrhage. Venous sinuses: Normal opacification seen throughout the superior sagittal sinus to the torcula. Transverse and sigmoid sinuses are patent  as are the proximal internal jugular veins. Straight sinus, vein of Galen, internal cerebral veins, and basal veins of Rosenthal patent. No evidence for dural sinus thrombosis. Anatomic variants: Hypoplastic left A1 segment. Fetal type origin of the left PCA. Review of the MIP images confirms the above findings IMPRESSION: 1. Negative CTA of the head and neck. No large vessel occlusion, hemodynamically significant stenosis, or other acute vascular abnormality. No vascular abnormality seen underlying the left cerebral hemorrhage. 2. Negative CT venogram.  No evidence for dural sinus thrombosis. 3. Chronic left sphenoid sinusitis. Electronically Signed   By: Jeannine Boga M.D.   On: 01/26/2020 21:47   CT Angio Neck W and/or Wo Contrast  Result Date: 01/26/2020 CLINICAL DATA:  Initial evaluation for acute intracranial hemorrhage. EXAM: CT ANGIOGRAPHY HEAD AND NECK CT VENOGRAM HEAD TECHNIQUE: Multidetector CT imaging of the head and neck was performed using the standard protocol during bolus administration of intravenous contrast. Multiplanar CT image reconstructions and MIPs were obtained to evaluate the vascular anatomy. Carotid stenosis measurements (when applicable) are obtained utilizing NASCET criteria, using the distal  internal carotid diameter as the denominator. CONTRAST:  39mL OMNIPAQUE IOHEXOL 350 MG/ML SOLN COMPARISON:  Prior CT from earlier the same day. FINDINGS: CTA NECK FINDINGS Aortic arch: Visualized aortic arch of normal caliber with normal 3 vessel morphology. Mild atheromatous change within the arch itself. No hemodynamically significant stenosis seen about the origin of the great vessels. Right carotid system: Right common carotid artery widely patent from its origin to the bifurcation. No significant atheromatous stenosis about the right bifurcation. Right ICA widely patent distally to the skull base without stenosis, dissection or occlusion. Left carotid system: Left common carotid artery widely patent from its origin to the bifurcation without stenosis. Minimal calcified plaque about the left bifurcation without stenosis. Left ICA widely patent distally to the skull base without stenosis, dissection or occlusion. Vertebral arteries: Both vertebral arteries arise from the subclavian arteries. No proximal subclavian artery stenosis. Left vertebral artery dominant. Vertebral arteries patent within the neck without stenosis, dissection or occlusion. Skeleton: No acute osseous abnormality. No discrete lytic or blastic osseous lesions. Prior ACDF at C3-C6 without adverse features. Other neck: No other acute soft tissue abnormality within the neck. Chronic left sphenoid sinusitis noted. No mass lesion or adenopathy. 8 mm right thyroid nodule noted, felt to be of doubtful significance given size and patient age. No follow-up imaging recommended regarding this lesion. Upper chest: Visualized upper chest demonstrates no acute finding. Review of the MIP images confirms the above findings CTA HEAD FINDINGS Anterior circulation: Petrous segments widely patent bilaterally. Mild atheromatous change seen within the cavernous/supraclinoid ICAs without stenosis or other abnormality. ICA termini well perfused. Right A1 widely  patent. Left A1 hypoplastic but patent as well. Normal anterior communicating artery complex. Anterior cerebral arteries widely patent to their distal aspects without stenosis. No M1 stenosis or occlusion. Normal MCA bifurcations. Distal MCA branches well perfused and symmetric. Posterior circulation: Vertebral arteries widely patent to the vertebrobasilar junction without stenosis. Left vertebral artery dominant. Patent right PICA. Left PICA not definitely seen. Basilar widely patent to its distal aspect without stenosis. Superior cerebral arteries patent bilaterally. Right PCA supplied via the basilar. Fetal type origin left PCA. Both PCAs well perfused to their distal aspects without stenosis. No intracranial aneurysm. No vascular abnormality seen underlying the left frontoparietal hemorrhage. Venous sinuses: Normal opacification seen throughout the superior sagittal sinus to the torcula. Transverse and sigmoid sinuses are patent as are the proximal  internal jugular veins. Straight sinus, vein of Galen, internal cerebral veins, and basal veins of Rosenthal patent. No evidence for dural sinus thrombosis. Anatomic variants: Hypoplastic left A1 segment. Fetal type origin of the left PCA. Review of the MIP images confirms the above findings IMPRESSION: 1. Negative CTA of the head and neck. No large vessel occlusion, hemodynamically significant stenosis, or other acute vascular abnormality. No vascular abnormality seen underlying the left cerebral hemorrhage. 2. Negative CT venogram.  No evidence for dural sinus thrombosis. 3. Chronic left sphenoid sinusitis. Electronically Signed   By: Jeannine Boga M.D.   On: 01/26/2020 21:47   MR BRAIN W WO CONTRAST  Result Date: 01/27/2020 CLINICAL DATA:  Stroke follow-up EXAM: MRI HEAD WITHOUT AND WITH CONTRAST TECHNIQUE: Multiplanar, multiecho pulse sequences of the brain and surrounding structures were obtained without and with intravenous contrast. CONTRAST:  12mL  GADAVIST GADOBUTROL 1 MMOL/ML IV SOLN COMPARISON:  Head CT 01/26/2020 FINDINGS: Brain: Unchanged 3.6 cm intraparenchymal hematoma in the superior left hemisphere. There is moderate surrounding edema. No midline shift or other mass effect. There is no abnormal contrast enhancement. Multifocal white matter hyperintensity, most commonly due to chronic ischemic microangiopathy. There are small foci of acute ischemia within the right MCA/PCA watershed zone. No chronic microhemorrhage. Normal midline structures. Vascular: Normal flow voids. Skull and upper cervical spine: Normal marrow signal. Sinuses/Orbits: Right mastoid fluid. Paranasal sinuses are clear. Normal orbits. Other: None IMPRESSION: 1. Unchanged 3.6 cm intraparenchymal hematoma in the superior left hemisphere with moderate surrounding edema. No midline shift. 2. Small foci of acute ischemia within the right MCA/PCA watershed zone. Electronically Signed   By: Ulyses Jarred M.D.   On: 01/27/2020 20:13   ECHOCARDIOGRAM COMPLETE  Result Date: 01/28/2020    ECHOCARDIOGRAM REPORT   Patient Name:   Alison Cuevas Date of Exam: 01/27/2020 Medical Rec #:  [id number]     Height:       60.0 in Accession #:    [id number]    Weight:       130.0 lb Date of Birth:  1945/09/08      BSA:          1.554 m Patient Age:    77 years      BP:           129/59 mmHg Patient Gender: F             HR:           93 bpm. Exam Location:  Inpatient Procedure: 2D Echo, Cardiac Doppler and Color Doppler Indications:    Stroke 434.9 / I163.9  History:        Patient has no prior history of Echocardiogram examinations.                 Risk Factors:Hypertension and Dyslipidemia. CKD. GERD.  Sonographer:    Jonelle Sidle Dance Referring Phys: [place]  1. Left ventricular ejection fraction, by estimation, is 65 to 70%. The left ventricle has normal function. The left ventricle has no regional wall motion abnormalities. Left ventricular diastolic parameters are indeterminate.  2.  Right ventricular systolic function is normal. The right ventricular size is normal. Tricuspid regurgitation signal is inadequate for assessing PA pressure.  3. The mitral valve is normal in structure. No evidence of mitral valve regurgitation. No evidence of mitral stenosis.  4. The aortic valve is tricuspid. Aortic valve regurgitation is not visualized. No aortic stenosis is present.  5. The inferior vena cava  is normal in size with greater than 50% respiratory variability, suggesting right atrial pressure of 3 mmHg.  6. Increased flow velocities across LVOT, AV, and PV maybe secondary to anemia, thyrotoxicosis, hyperdynamic or high flow state. Conclusion(s)/Recommendation(s): No intracardiac source of embolism detected on this transthoracic study. A transesophageal echocardiogram is recommended to exclude cardiac source of embolism if clinically indicated. FINDINGS  Left Ventricle: Left ventricular ejection fraction, by estimation, is 65 to 70%. The left ventricle has normal function. The left ventricle has no regional wall motion abnormalities. The left ventricular internal cavity size was normal in size. There is  no left ventricular hypertrophy. Left ventricular diastolic parameters are indeterminate. Right Ventricle: The right ventricular size is normal. No increase in right ventricular wall thickness. Right ventricular systolic function is normal. Tricuspid regurgitation signal is inadequate for assessing PA pressure. Left Atrium: Left atrial size was normal in size. Right Atrium: Right atrial size was normal in size. Pericardium: A small pericardial effusion is present. The pericardial effusion is anterior to the right ventricle. Mitral Valve: The mitral valve is normal in structure. Normal mobility of the mitral valve leaflets. No evidence of mitral valve regurgitation. No evidence of mitral valve stenosis. Tricuspid Valve: The tricuspid valve is normal in structure. Tricuspid valve regurgitation is not  demonstrated. No evidence of tricuspid stenosis. Aortic Valve: The aortic valve is tricuspid. Aortic valve regurgitation is not visualized. No aortic stenosis is present. Pulmonic Valve: The pulmonic valve was grossly normal. Pulmonic valve regurgitation is trivial. No evidence of pulmonic stenosis. Aorta: The aortic root is normal in size and structure. Venous: The inferior vena cava is normal in size with greater than 50% respiratory variability, suggesting right atrial pressure of 3 mmHg. IAS/Shunts: No atrial level shunt detected by color flow Doppler.  LEFT VENTRICLE PLAX 2D LVIDd:         3.45 cm  Diastology LVIDs:         2.10 cm  LV e' lateral: 5.59 cm/s LV PW:         0.90 cm  LV e' medial:  5.41 cm/s LV IVS:        1.20 cm LVOT diam:     1.90 cm LV SV:         94 LV SV Index:   60 LVOT Area:     2.84 cm  RIGHT VENTRICLE             IVC RV Basal diam:  2.30 cm     IVC diam: 1.70 cm RV S prime:     18.60 cm/s TAPSE (M-mode): 1.6 cm LEFT ATRIUM           Index       RIGHT ATRIUM           Index LA diam:      3.30 cm 2.12 cm/m  RA Area:     10.70 cm LA Vol (A2C): 32.5 ml 20.91 ml/m RA Volume:   24.00 ml  15.44 ml/m LA Vol (A4C): 25.5 ml 16.41 ml/m  AORTIC VALVE LVOT Vmax:   182.00 cm/s LVOT Vmean:  131.000 cm/s LVOT VTI:    0.331 m  AORTA Ao Root diam: 3.90 cm MV A velocity: 109.50 cm/s                             SHUNTS  Systemic VTI:  0.33 m                             Systemic Diam: 1.90 cm Cherlynn Kaiser MD Electronically signed by Cherlynn Kaiser MD Signature Date/Time: 01/28/2020/1:13:37 AM    Final    CT VENOGRAM HEAD  Result Date: 01/26/2020 CLINICAL DATA:  Initial evaluation for acute intracranial hemorrhage. EXAM: CT ANGIOGRAPHY HEAD AND NECK CT VENOGRAM HEAD TECHNIQUE: Multidetector CT imaging of the head and neck was performed using the standard protocol during bolus administration of intravenous contrast. Multiplanar CT image reconstructions and MIPs were  obtained to evaluate the vascular anatomy. Carotid stenosis measurements (when applicable) are obtained utilizing NASCET criteria, using the distal internal carotid diameter as the denominator. CONTRAST:  77mL OMNIPAQUE IOHEXOL 350 MG/ML SOLN COMPARISON:  Prior CT from earlier the same day. FINDINGS: CTA NECK FINDINGS Aortic arch: Visualized aortic arch of normal caliber with normal 3 vessel morphology. Mild atheromatous change within the arch itself. No hemodynamically significant stenosis seen about the origin of the great vessels. Right carotid system: Right common carotid artery widely patent from its origin to the bifurcation. No significant atheromatous stenosis about the right bifurcation. Right ICA widely patent distally to the skull base without stenosis, dissection or occlusion. Left carotid system: Left common carotid artery widely patent from its origin to the bifurcation without stenosis. Minimal calcified plaque about the left bifurcation without stenosis. Left ICA widely patent distally to the skull base without stenosis, dissection or occlusion. Vertebral arteries: Both vertebral arteries arise from the subclavian arteries. No proximal subclavian artery stenosis. Left vertebral artery dominant. Vertebral arteries patent within the neck without stenosis, dissection or occlusion. Skeleton: No acute osseous abnormality. No discrete lytic or blastic osseous lesions. Prior ACDF at C3-C6 without adverse features. Other neck: No other acute soft tissue abnormality within the neck. Chronic left sphenoid sinusitis noted. No mass lesion or adenopathy. 8 mm right thyroid nodule noted, felt to be of doubtful significance given size and patient age. No follow-up imaging recommended regarding this lesion. Upper chest: Visualized upper chest demonstrates no acute finding. Review of the MIP images confirms the above findings CTA HEAD FINDINGS Anterior circulation: Petrous segments widely patent bilaterally. Mild  atheromatous change seen within the cavernous/supraclinoid ICAs without stenosis or other abnormality. ICA termini well perfused. Right A1 widely patent. Left A1 hypoplastic but patent as well. Normal anterior communicating artery complex. Anterior cerebral arteries widely patent to their distal aspects without stenosis. No M1 stenosis or occlusion. Normal MCA bifurcations. Distal MCA branches well perfused and symmetric. Posterior circulation: Vertebral arteries widely patent to the vertebrobasilar junction without stenosis. Left vertebral artery dominant. Patent right PICA. Left PICA not definitely seen. Basilar widely patent to its distal aspect without stenosis. Superior cerebral arteries patent bilaterally. Right PCA supplied via the basilar. Fetal type origin left PCA. Both PCAs well perfused to their distal aspects without stenosis. No intracranial aneurysm. No vascular abnormality seen underlying the left frontoparietal hemorrhage. Venous sinuses: Normal opacification seen throughout the superior sagittal sinus to the torcula. Transverse and sigmoid sinuses are patent as are the proximal internal jugular veins. Straight sinus, vein of Galen, internal cerebral veins, and basal veins of Rosenthal patent. No evidence for dural sinus thrombosis. Anatomic variants: Hypoplastic left A1 segment. Fetal type origin of the left PCA. Review of the MIP images confirms the above findings IMPRESSION: 1. Negative CTA of the head and neck. No large vessel occlusion,  hemodynamically significant stenosis, or other acute vascular abnormality. No vascular abnormality seen underlying the left cerebral hemorrhage. 2. Negative CT venogram.  No evidence for dural sinus thrombosis. 3. Chronic left sphenoid sinusitis. Electronically Signed   By: Jeannine Boga M.D.   On: 01/26/2020 21:47   CT HEAD CODE STROKE WO CONTRAST  Result Date: 01/26/2020 CLINICAL DATA:  Code stroke. Initial evaluation for acute right-sided weakness.  EXAM: CT HEAD WITHOUT CONTRAST TECHNIQUE: Contiguous axial images were obtained from the base of the skull through the vertex without intravenous contrast. COMPARISON:  None. FINDINGS: Brain: There is an acute intraparenchymal hemorrhage centered at the high left frontoparietal convexity measuring 3.6 x 2.6 x 3.0 cm (estimated volume 14 cc). Mild surrounding vasogenic edema. Associated subdural extension with a small subdural hemorrhage overlying the left cerebral convexity measuring up to 4 mm in thickness. Associated 4 mm left-to-right midline shift. No hydrocephalus or trapping. No other acute intracranial hemorrhage. No other acute large vessel territory infarct. Underlying age-related cerebral atrophy with moderate chronic microvascular ischemic disease. Small remote right cerebellar infarct noted. Vascular: No hyperdense vessel. Skull: Scalp soft tissues and calvarium within normal limits. Sinuses/Orbits: Globes and orbital soft tissues within normal limits. Left sphenoid sinusitis noted. Paranasal sinuses are otherwise clear. Sequelae of prior right mastoidectomy with opacification of the residual mastoid air cells. Other: None. IMPRESSION: 1. 3.6 x 2.6 x 3.0 cm acute intraparenchymal hemorrhage centered at the high left frontoparietal convexity (estimated volume 14 cc). Associated subdural extension with a small subdural hemorrhage overlying the left cerebral convexity measuring up to 4 mm in thickness. Associated 4 mm left-to-right midline shift. No hydrocephalus or trapping. 2. Underlying age-related cerebral atrophy with moderate chronic microvascular ischemic disease. Critical Value/emergent results were called by telephone at the time of interpretation on 01/26/2020 at 7:34 pm to provider Sherwood Gambler , who verbally acknowledged these results. Electronically Signed   By: Jeannine Boga M.D.   On: 01/26/2020 19:38     Assessment/Plan: Diagnosis: Left ICH 1. Does the need for close, 24 hr/day  medical supervision in concert with the patient's rehab needs make it unreasonable for this patient to be served in a less intensive setting? Yes 2. Co-Morbidities requiring supervision/potential complications: HTN, HLD, anxiety, degenerative joint disease, right ear implant 3. Due to bladder management, bowel management, safety, skin/wound care, disease management, medication administration, pain management and patient education, does the patient require 24 hr/day rehab nursing? Yes 4. Does the patient require coordinated care of a physician, rehab nurse, therapy disciplines of PT, OT, SLP to address physical and functional deficits in the context of the above medical diagnosis(es)? Yes Addressing deficits in the following areas: balance, endurance, locomotion, strength, transferring, bowel/bladder control, bathing, dressing, feeding, grooming, toileting, cognition and psychosocial support 5. Can the patient actively participate in an intensive therapy program of at least 3 hrs of therapy per day at least 5 days per week? Yes 6. The potential for patient to make measurable gains while on inpatient rehab is excellent 7. Anticipated functional outcomes upon discharge from inpatient rehab are min assist  with PT, min assist with OT, modified independent with SLP. 8. Estimated rehab length of stay to reach the above functional goals is: 2-3 weeks 9. Anticipated discharge destination: Home 10. Overall Rehab/Functional Prognosis: excellent  RECOMMENDATIONS: This patient's condition is appropriate for continued rehabilitative care in the following setting: CIR Patient has agreed to participate in recommended program. Yes Note that insurance prior authorization may be required for reimbursement for recommended  care.  Comment: Alison Cuevas would be an excellent CIR candidate. She will have supervision from her husband at home but not much physical assistance given his physical disabilities. She has dense  right sided hemiplegia. She would benefit from heating pad for her neck (nurse bringing right now). Sleepy from Baclofen and may benefit from frequency changed to night time only.   Bary Leriche, PA-C 01/28/2020   I have personally performed a face to face diagnostic evaluation, including, but not limited to relevant history and physical exam findings, of this patient and developed relevant assessment and plan.  Additionally, I have reviewed and concur with the physician assistant's documentation above.  Leeroy Cha, MD

## 2020-01-28 NOTE — Progress Notes (Signed)
  Speech Language Pathology Treatment: Cognitive-Linquistic  Patient Details Name: Charne Mcbrien MRN: [id number] DOB: Jun 25, 1946 Today's Date: 01/28/2020 Time: 4166-0630 SLP Time Calculation (min) (ACUTE ONLY): 17 min  Assessment / Plan / Recommendation Clinical Impression  Pt is lethargic today and more distractible. SLP provided Mod-Max cues for sustained attention and to maintain LOA. Despite cues, pt was not able to find her husband in a photo on her R side until it was brought more toward her midline. Pt is showing increasing awareness of physical changes, but still with reduced safety awareness. Continue to recommend intensive SLP f/u to maximize functional independence.    HPI HPI: Pt is a 74 yo female presenting with acute onset R-sided numbness/weakness. 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 MRI pending. PMH includes: chronic low back pain, DDD, HTN, HLD, anxiety, GERD      SLP Plan  Continue with current plan of care       Recommendations  Medication Administration: Whole meds with puree Compensations: Minimize environmental distractions;Slow rate;Small sips/bites                Oral Care Recommendations: Oral care BID Follow up Recommendations: Inpatient Rehab SLP Visit Diagnosis: Cognitive communication deficit (Z60.109) Plan: Continue with current plan of care       GO                Osie Bond., M.A. [facility] Pager [phone] Office [phone]  01/28/2020, 12:09 PM

## 2020-01-28 NOTE — Progress Notes (Signed)
Occupational Therapy Treatment Patient Details Name: Alison Cuevas MRN: [id number] DOB: [birth year] Today's Date: 01/28/2020    History of present illness Alison Cuevas is a 74 y.o. female past medical history of chronic low back pain, degenerative disc disease, hypertension, hyperlipidemia, anxiety, presented to [facility] emergency room as an acute code stroke for sudden onset of right-sided weakness.Stat head CT done revealed a intraparenchymal hematoma centered at the left frontoparietal convexity of 14 cc volume with associated subdural extension with a small subdural hemorrhage overlying the left cerebral convexity measuring up to 4 mm in thickness   OT comments  This 74 yo female seen for a second session today for application of RUE soft elbow splint. Good fit at present. OT will follow up later today for tolerance of splint wearing and set up of wearing schedule.  Follow Up Recommendations  CIR;Supervision/Assistance - 24 hour    Equipment Recommendations  Other (comment) (TBD next venue)    Recommendations for Other Services Rehab consult    Precautions / Restrictions Precautions Precautions: Fall Precaution Comments: Increased tone RUE (flexion)/LE(RLE) Restrictions Weight Bearing Restrictions: No                                 Exercises Other Exercises Other Exercises: Pt seen for application of RUE soft elbow splint due to intermittent increased tone. Tone is somewhat better today (per MD they started her on baclofen yesterday), but arm still draws up into flexion at times and patient cannot extend it back out. Dtr in room and explained splint to her.     Pertinent Vitals/ Pain       Pain Assessment: Faces Faces Pain Scale: No hurt         Frequency  Min 2X/week        Progress Toward Goals  OT Goals(current goals can now be found in the care plan section)  Progress towards OT goals: Progressing toward goals     Plan Discharge plan remains  appropriate    Co-evaluation    PT/OT/SLP Co-Evaluation/Treatment: Yes Reason for Co-Treatment: For patient/therapist safety;To address functional/ADL transfers PT goals addressed during session: Mobility/safety with mobility;Balance;Strengthening/ROM OT goals addressed during session: Strengthening/ROM;ADL's and self-care      AM-PAC OT "6 Clicks" Daily Activity     Outcome Measure   Help from another person eating meals?: A Lot Help from another person taking care of personal grooming?: A Lot Help from another person toileting, which includes using toliet, bedpan, or urinal?: Total Help from another person bathing (including washing, rinsing, drying)?: Total Help from another person to put on and taking off regular upper body clothing?: Total Help from another person to put on and taking off regular lower body clothing?: Total 6 Click Score: 7    End of Session Equipment Utilized During Treatment: Gait belt  OT Visit Diagnosis: Unsteadiness on feet (R26.81);Other abnormalities of gait and mobility (R26.89);Muscle weakness (generalized) (M62.81);Low vision, both eyes (H54.2);Other symptoms and signs involving cognitive function   Activity Tolerance Patient tolerated treatment well   Patient Left in chair;with call bell/phone within reach;with chair alarm set   Nurse Communication Need for lift equipment        Time: 7902-4097 OT Time Calculation (min): 8 min  Charges: OT General Charges $OT Visit: 1 Visit OT Treatments $Therapeutic Activity: 8-22 mins $Orthotics Fit/Training: 8-22 mins  [address], OTR/L Acute [employer] Pager [phone] Office [phone]  Almon Register 01/28/2020, 10:49 AM

## 2020-01-28 NOTE — Progress Notes (Signed)
EEG complete - results pending 
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
## 2020-01-28 NOTE — Progress Notes (Signed)
Physical Therapy Treatment Patient Details Name: Alison Cuevas MRN: [id number] DOB: October 21, 1945 Today's Date: 01/28/2020    History of Present Illness Alison Cuevas is a 74 y.o. female past medical history of chronic low back pain, degenerative disc disease, hypertension, hyperlipidemia, anxiety, presented to [facility] emergency room as an acute code stroke for sudden onset of right-sided weakness.Stat head CT done revealed a intraparenchymal hematoma centered at the left frontoparietal convexity of 14 cc volume with associated subdural extension with a small subdural hemorrhage overlying the left cerebral convexity measuring up to 4 mm in thickness    PT Comments    Pt with improved sitting tolerance this day, and engaged in dynamic sitting balance tasks well with min-mod assist to correct posterior leaning. Pt required max-total assist +2 for transfer to recliner, but OT present with PT during session notes less RLE extensor tone and clonus today vs previous session. Pt able to scan past midline toward R with max multimodal cuing, and very increased effort. Pt making progress with mobility, PT continuing to recommend CIR post-acutely.    Follow Up Recommendations  CIR;Supervision/Assistance - 24 hour;Supervision for mobility/OOB     Equipment Recommendations  Other (comment) (TBA)    Recommendations for Other Services Rehab consult     Precautions / Restrictions Precautions Precautions: Fall Precaution Comments: Increased tone RUE (flexion)/RLE(extension) Restrictions Weight Bearing Restrictions: No    Mobility  Bed Mobility Overal bed mobility: Needs Assistance Bed Mobility: Rolling;Sidelying to Sit Rolling: Max assist Sidelying to sit: Max assist;HOB elevated       General bed mobility comments: Max assist for roll to R for trunk and LE management, pt able to reach LUE to opposite bedrail and initiate truncal translation. Max assist for sidelying to sit for trunk elevation,  lowering LEs over EOB, and scooting to EOB with use of bed pad.  Transfers Overall transfer level: Needs assistance Equipment used: 2 person hand held assist Transfers: Sit to/from [facility] Sit to Stand: Max assist;+2 physical assistance Stand pivot transfers: Total assist;+2 physical assistance       General transfer comment: Max +2 for stand from EOB for power up, hip extension facilitation, steadying, and bilateral LE blocking R>L. With transfer pt needed increased A with RLE (foot supination) with transfer--going towards pt's right  Ambulation/Gait             General Gait Details: Unable   Stairs             Wheelchair Mobility    Modified Rankin (Stroke Patients Only) Modified Rankin (Stroke Patients Only) Pre-Morbid Rankin Score: No symptoms Modified Rankin: Severe disability     Balance Overall balance assessment: Needs assistance Sitting-balance support: Single extremity supported Sitting balance-Leahy Scale: Poor Sitting balance - Comments: Fair (5-10 seconds) to Poor (posterior lean requiring min-mod assist to correct ); had pt work on sittting balance at EOB (reaching with LUE reaching for footboard of bed and forward to arm of recliner, R forearm propping)   Standing balance support: Single extremity supported Standing balance-Leahy Scale: Zero Standing balance comment: max A +2 without sustained clonus today                            Cognition Arousal/Alertness: Awake/alert Behavior During Therapy: Flat affect Overall Cognitive Status: Impaired/Different from baseline Area of Impairment: Orientation;Following commands;Safety/judgement;Problem solving                 Orientation Level: Disoriented to;Place;Time (  and situation intially (once told situation, then she able to answer this question later in session); did not know month or year)     Following Commands: Follows one step commands  inconsistently Safety/Judgement: Decreased awareness of safety;Decreased awareness of deficits   Problem Solving: Slow processing;Decreased initiation;Difficulty sequencing;Requires verbal cues;Requires tactile cues General Comments: Right inattention, requiring visual and verbal cues to orient to R. Crosses visual midline with repeated cuing. Increased time to follow verbal commands      Exercises Other Exercises Other Exercises: EOB sitting: anterior pelvic tilt via PT posterior facilitation, R UE propping and return to midline, correcting posterior leaning    General Comments        Pertinent Vitals/Pain Pain Assessment: Faces Faces Pain Scale: Hurts a little bit Pain Location: neck Pain Descriptors / Indicators: Discomfort;Sore Pain Intervention(s): Limited activity within patient's tolerance;Monitored during session;Repositioned;Other (comment) (Pt wearing soft collar from home for comfort)    Home Living                      Prior Function            PT Goals (current goals can now be found in the care plan section) Acute Rehab PT Goals Patient Stated Goal: to get better PT Goal Formulation: With patient Time For Goal Achievement: 02/10/20 Potential to Achieve Goals: Fair Progress towards PT goals: Progressing toward goals    Frequency    Min 4X/week      PT Plan Current plan remains appropriate    Co-evaluation PT/OT/SLP Co-Evaluation/Treatment: Yes Reason for Co-Treatment: Complexity of the patient's impairments (multi-system involvement);For patient/therapist safety;To address functional/ADL transfers PT goals addressed during session: Mobility/safety with mobility;Balance OT goals addressed during session: Strengthening/ROM;ADL's and self-care      AM-PAC PT "6 Clicks" Mobility   Outcome Measure  Help needed turning from your back to your side while in a flat bed without using bedrails?: A Lot Help needed moving from lying on your back to  sitting on the side of a flat bed without using bedrails?: Total Help needed moving to and from a bed to a chair (including a wheelchair)?: Total Help needed standing up from a chair using your arms (e.g., wheelchair or bedside chair)?: Total Help needed to walk in hospital room?: Total Help needed climbing 3-5 steps with a railing? : Total 6 Click Score: 7    End of Session Equipment Utilized During Treatment: Gait belt Activity Tolerance: Patient tolerated treatment well;Patient limited by fatigue Patient left: in chair;with call bell/phone within reach;with nursing/sitter in room;with family/visitor present;with chair alarm set Nurse Communication: Mobility status;Need for lift equipment PT Visit Diagnosis: Hemiplegia and hemiparesis;Unsteadiness on feet (R26.81);Difficulty in walking, not elsewhere classified (R26.2) Hemiplegia - Right/Left: Right Hemiplegia - dominant/non-dominant: Non-dominant     Time: 2831-5176 PT Time Calculation (min) (ACUTE ONLY): 24 min  Charges:  $Neuromuscular Re-education: 8-22 mins                     Bailey Faiella E, PT Acute Rehabilitation Services Pager [phone]  Office [phone]    Lyris Hitchman D Arly Salminen 01/28/2020, 1:02 PM

## 2020-01-28 NOTE — Progress Notes (Signed)
Occupational Therapy Treatment Patient Details Name: Alison Cuevas MRN: [id number] DOB: Jun 15, 1946 Today's Date: 01/28/2020    History of present illness Alison Cuevas is a 74 y.o. female past medical history of chronic low back pain, degenerative disc disease, hypertension, hyperlipidemia, anxiety, presented to [facility] emergency room as an acute code stroke for sudden onset of right-sided weakness.Stat head CT done revealed a intraparenchymal hematoma centered at the left frontoparietal convexity of 14 cc volume with associated subdural extension with a small subdural hemorrhage overlying the left cerebral convexity measuring up to 4 mm in thickness   OT comments  This 74 yo female seen today to focus on vision, mobility, transfers. Tone is a little better today (varied v. Extreme flexion(arm) and extension (leg), no clonus in RLE today, sitting balance better today; however vision/inattention does seem to be a little worse. She will continue to benefit from acute OT with follow up on CIR. Have now gotten the order for soft splint to right elbow, will place today.  Follow Up Recommendations  CIR;Supervision/Assistance - 24 hour    Equipment Recommendations  Other (comment) (TBD next venue)       Precautions / Restrictions Precautions Precautions: Fall Precaution Comments: Increased tone RUE (flexion)/LE(RLE) Restrictions Weight Bearing Restrictions: No       Mobility Bed Mobility Overal bed mobility: Needs Assistance Bed Mobility: Rolling;Sidelying to Sit Rolling: Max assist Sidelying to sit: Max assist       General bed mobility comments: Pt needed A and VCs for Bil LEs to roll and to scoot off of bed, A and VCs for trunk to roll and to sit up  Transfers Overall transfer level: Needs assistance Equipment used: 2 person hand held assist Transfers: Sit to/from [facility] Sit to Stand: Max assist;+2 physical assistance Stand pivot transfers: Total assist;+2  physical assistance       General transfer comment: Pt able to stand with +2 max A with right knee buckling today, with transfer pt needed increased A with RLE (foot supination) with transfer--going to pt's right    Balance Overall balance assessment: Needs assistance Sitting-balance support: Single extremity supported   Sitting balance - Comments: Fair (5-10 seconds) to Poor (posterior lean); had pt work on sittting balance at EOB (reaching with LUE reaching for footboard of bed and forward to arm of recliner   Standing balance support: Single extremity supported Standing balance-Leahy Scale: Zero Standing balance comment: max A +2 without sustained clonus today                           ADL either performed or assessed with clinical judgement   ADL Overall ADL's : Needs assistance/impaired                         Toilet Transfer: +2 for physical assistance;Stand-pivot;Total assistance Toilet Transfer Details (indicate cue type and reason): bed>recliner going to pt's right                 Vision Patient Visual Report:  (dtr reports she feels her moms vision is even different from yesterday) Additional Comments: Today pt does not as readily turn to the right when spoken to or asked to do so, when she does "lock in with you" visually she can track past midline to right          Cognition Arousal/Alertness: Awake/alert Behavior During Therapy: Flat affect Overall Cognitive Status: Impaired/Different from baseline  Area of Impairment: Orientation;Following commands;Safety/judgement;Problem solving                 Orientation Level: Disoriented to;Place;Time (and situation intially (once told situation, then she able to answer this question later in session); did not know month or year)     Following Commands: Follows one step commands inconsistently Safety/Judgement: Decreased awareness of safety;Decreased awareness of deficits   Problem  Solving: Slow processing;Decreased initiation;Difficulty sequencing;Requires verbal cues;Requires tactile cues General Comments: Right inattention, right visual field deficits                   Pertinent Vitals/ Pain       Pain Assessment: Faces Faces Pain Scale: No hurt         Frequency  Min 2X/week        Progress Toward Goals  OT Goals(current goals can now be found in the care plan section)  Progress towards OT goals: Progressing toward goals     Plan Discharge plan remains appropriate    Co-evaluation    PT/OT/SLP Co-Evaluation/Treatment: Yes Reason for Co-Treatment: For patient/therapist safety;To address functional/ADL transfers PT goals addressed during session: Mobility/safety with mobility;Balance;Strengthening/ROM OT goals addressed during session: Strengthening/ROM;ADL's and self-care      AM-PAC OT "6 Clicks" Daily Activity     Outcome Measure   Help from another person eating meals?: A Lot Help from another person taking care of personal grooming?: A Lot Help from another person toileting, which includes using toliet, bedpan, or urinal?: Total Help from another person bathing (including washing, rinsing, drying)?: A Lot Help from another person to put on and taking off regular upper body clothing?: Total Help from another person to put on and taking off regular lower body clothing?: Total 6 Click Score: 9    End of Session Equipment Utilized During Treatment: Gait belt  OT Visit Diagnosis: Unsteadiness on feet (R26.81);Other abnormalities of gait and mobility (R26.89);Muscle weakness (generalized) (M62.81);Low vision, both eyes (H54.2);Other symptoms and signs involving cognitive function   Activity Tolerance Patient tolerated treatment well   Patient Left in chair;with call bell/phone within reach;with chair alarm set   Nurse Communication Need for lift equipment        Time: [id number] OT Time Calculation (min): 24 min  Charges: OT  General Charges $OT Visit: 1 Visit OT Treatments $Therapeutic Activity: 8-22 mins  Alison Cuevas, OTR/L Acute [employer] Pager [phone] Office [phone]      Almon Register 01/28/2020, 10:24 AM

## 2020-01-28 NOTE — Evaluation (Signed)
Clinical/Bedside Swallow Evaluation Patient Details  Name: Alison Cuevas MRN: [id number] Date of Birth: June 01, 1946  Today's Date: 01/28/2020 Time: SLP Start Time (ACUTE ONLY): 64 SLP Stop Time (ACUTE ONLY): 1020 SLP Time Calculation (min) (ACUTE ONLY): 15 min  Past Medical History:  Past Medical History:  Diagnosis Date  . Allergic rhinitis    maple pollen  . Anxiety   . Arthritis   . Bowel obstruction ([place])   . Chronic low back pain    Dr. Trenton Gammon.  Has had back injection--bp elevated after.  . Chronic pain syndrome   . Chronic renal insufficiency, stage 2 (mild)    GFR 60-70  . DDD (degenerative disc disease), cervical    Hx of ACDF (Dr. [name]).  Followed by Dr. [name].  Also, Dr. [name] do left C7-T1 intralaminal epidural injection.  . Diverticulosis of colon   . DJD (degenerative joint disease)   . Gallstones   . GERD (gastroesophageal reflux disease)   . Herpes zoster 07/08/2014  . Hypercholesteremia    mild; pt declined statin trial 05/2014--needs recheck lipid panel at first f/u visit in 2017  . Hypertension   . Osteopenia 06/2017   T score -1.4 FRAX 15% / 2%  . Ovarian cyst 2017   82019-robotic assisted bilatel SPO: all path benign.  . Perianal dermatitis    prn cutivate  . Phlebitis   . Right knee injury 2018   Patellofemoral crush injury--Dr. [name].  . Trochanteric bursitis of right hip    Recurrent (injection by Dr. [name] 05/26/15)  . Tympanic membrane rupture, right 12/01/2016   As of 08/2018 pt set for tympanomastoidectomy and STSG (Dr. [name]). Recurrent fungal/bact OE.  . Varicose vein    left leg    Past Surgical History:  Past Surgical History:  Procedure Laterality Date  . ABDOMINAL HYSTERECTOMY  03/2015   TAH.  Last pap 2016.  No hx of abnl paps.  Per GYN, no further pap smears indicated.  . ANTERIOR AND POSTERIOR REPAIR N/A 03/18/2014   Procedure: Cystocele repair with graft, Vault suspension, Rectocele repair;  Surgeon:  [name], MD;  Location: [facility];  Service: Urology;  Laterality: N/A;  . CHOLECYSTECTOMY    . CHOLECYSTECTOMY, LAPAROSCOPIC    . COLON SURGERY    . COLONOSCOPY  04/2006; 05/26/16   2007 (Dr. [name]): Normal.  04/2016 (Dr. [name]) normal except diverticulosis and decreased anal sphincter tone.  No repeat colonoscopy is recommended due to age.  . cspine surgery     Dr. Wiliam Ke level ant cerv discectomy /fusion w/plating  . CYSTO N/A 03/18/2014   Procedure: CYSTO;  Surgeon: [name], MD;  Location: [facility];  Service: Urology;  Laterality: N/A;  . DEXA  07/30/2015; 06/2018   2017 and 2020 -->Osteopenia--repeat 2 yrs.  Marland Kitchen LYSIS OF ADHESION N/A 01/31/2018   Procedure: POSSIBLE LYSIS OF ADHESION;  Surgeon: Everitt Amber, MD;  Location: [facility];  Service: Gynecology;  Laterality: N/A;  . [address]     with prolasped bledder repair  . RESECTION OF COLON     BENIGN TUMOR  . RIGHT EAR SURGERY  08/23/2017   Dr. 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 right ear surgery  12/12/2018   Right tympanomastoidectomy, ossiculoplasty with partial prosthesis, split thickness skin graft from postauricular skin 1x1cm, and right tragal cartilage graft 1x1 cm [facility])  . right  hemicolectomy for diverticulitis with abscess  1993  . ROBOTIC ASSISTED BILATERAL SALPINGO OOPHERECTOMY Bilateral 01/31/2018   All PATH benign.  Procedure: XI ROBOTIC ASSISTED BILATERAL SALPINGO OOPHORECTOMY;  Surgeon: Everitt Amber, MD;  Location: [facility];  Service: Gynecology;  Laterality: Bilateral;  . TUBAL LIGATION     HPI:  Pt is a 74 yo female presenting with acute onset R-sided numbness/weakness. 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 MRI pending. PMH includes: chronic low back pain, DDD, HTN, HLD, anxiety, GERD   Assessment / Plan / Recommendation Clinical Impression  Pt seems to be a little more lethargic today compared to previous date, also having recently finished working with PT/OT. She initially presented without overt s/s of aspiration (and had passed the [facility] swallow screen on admission). She did however complain of biting her tongue, and took prolonged time to masticate dry solids. SLP administered liquid washes intermittently. As session progress and pt appeared to fatigue further, she had increased coughing noted with thin liquids. Nectar thick liquids were also attempted, but elicited intermittent coughing as well. Pt had the least amount of coughing when given cues from SLP to maintain alertness and eye opening with use of self-feeding. Pt and daughter describe mild h/o dysphagia primarily c/b difficulty swallowing pills - with h/o GERD, could suggest a baseline esophageal component.   At this point, pt is lethargic and not eating/drinking much. It is likely not the best time to pursue MBS either. Educated pt/daughter on the importance of not trying to eat and drink when so lethargic, only offering POs when fully alert and engaged in self-feeding. Instructed them to stop PO intake if coughing starts and notify RN/SLP. Will continue to follow closely to determine if MBS is indicated.   SLP Visit Diagnosis: Dysphagia, unspecified (R13.10)    Aspiration Risk  Moderate aspiration risk;Mild aspiration risk    Diet Recommendation Dysphagia 3 (Mech soft);Thin liquid   Liquid Administration via: Cup;Straw Medication Administration: Whole meds with puree Supervision: Staff to assist with self feeding;Full supervision/cueing for compensatory strategies Compensations: Minimize environmental distractions;Slow rate;Small sips/bites Postural Changes: Seated upright at 90 degrees;Remain upright for at least 30 minutes  after po intake    Other  Recommendations Oral Care Recommendations: Oral care BID   Follow up Recommendations Inpatient Rehab      Frequency and Duration min 2x/week  2 weeks       Prognosis Prognosis for Safe Diet Advancement: Good Barriers to Reach Goals: Cognitive deficits      Swallow Study   General HPI: Pt is a 74 yo female presenting with acute onset R-sided numbness/weakness. 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 MRI pending. PMH includes: chronic low back pain, DDD, HTN, HLD, anxiety, GERD Type of Study: Bedside Swallow Evaluation Previous Swallow Assessment: none in chart - initially passed [facility] Diet Prior to this Study: Regular;Thin liquids Temperature Spikes Noted: No Respiratory Status: Room air History of Recent Intubation: No Behavior/Cognition: Lethargic/Drowsy;Distractible;Requires cueing Oral Cavity Assessment: Within Functional Limits Oral Care Completed by SLP: No Oral Cavity - Dentition: Adequate natural dentition Vision: Functional for self-feeding Self-Feeding Abilities: Needs assist Patient Positioning: Upright in chair Baseline Vocal Quality: Normal Volitional Cough: Strong Volitional Swallow: Able to  elicit    Oral/Motor/Sensory Function Overall Oral Motor/Sensory Function: Mild impairment Facial ROM: Reduced right;Suspected CN VII (facial) dysfunction Facial Symmetry: Abnormal symmetry right;Suspected CN VII (facial) dysfunction Facial Strength: Reduced right;Suspected CN VII (facial) dysfunction Lingual ROM: Within Functional Limits Lingual Symmetry: Within Functional Limits Lingual Strength: Reduced   Ice Chips Ice chips: Not tested   Thin Liquid Thin Liquid: Impaired Presentation: Cup;Self Fed;Straw Pharyngeal  Phase Impairments: Cough - Immediate    Nectar Thick Nectar Thick Liquid:  Impaired Presentation: Cup;Self Fed;Straw Pharyngeal Phase Impairments: Cough - Immediate   Honey Thick Honey Thick Liquid: Not tested   Puree Puree: Within functional limits Presentation: Self Fed;Spoon   Solid     Solid: Impaired Presentation: Self Fed Oral Phase Impairments: Impaired mastication;Other (comment) (feels like she is biting on her tongue)      Osie Bond., M.A. [facility] Pager [phone] Office [phone]  01/28/2020,12:02 PM

## 2020-01-28 NOTE — Progress Notes (Addendum)
  OT NOTE  RN STAFF  Please check splint every 4 hours during shift ( remove splint , remove stockinette/ dressing present) to assess for: * pain * redness *swelling  If any symptoms above present remove splint for 15 minutes. If symptoms continue - keep the splint removed and notify OT staff [phone] immediately.   Keep the UE elevated at all times on pillows / towels.  Splint can be cleaned with warm soapy water and alcohol swab. Splint should not be placed in heat of any kind because the splint with mold into a new shape.   Splint schedule is 4 hours on and 4 hours off.   Please remove splint and leave off if any concerns are present. Pt tolerated splint well throughout day today. Daughter and RN present during education.    Fleeta Emmer, OTR/L  Acute Rehabilitation Services Pager: [phone] Office: [phone] .

## 2020-01-28 NOTE — Procedures (Signed)
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  EEG 12 for seizures.  Level of alertness: Awake, asleep  AEDs during EEG study: None  Technical aspects: This EEG study was done with scalp electrodes positioned according to the 10-20 International system of electrode placement. Electrical activity was acquired at a sampling rate of 500Hz  and reviewed with a high frequency filter of 70Hz  and a low frequency filter of 1Hz . EEG data were recorded continuously and digitally stored.   Description: No clear posterior dominant rhythm was seen.  Sleep was characterized by vertex waves, sleep spindles (12 to 14 Hz), maximal frontocentral region.  EEG showed continuous generalized and lateralized left hemisphere 3 to 6 Hz theta-delta slowing.  Sharp waves were also seen in left frontal region. Hyperventilation and photic stimulation were not performed.     ABNORMALITY -Sharp wave, left frontal region -Continuous slow, generalized and lateralized left hemisphere  IMPRESSION: This study showed evidence of potential epileptogenicity arising from left frontal region as well as cortical dysfunction in left hemisphere likely secondary to underlying hemorrhage.  Additionally there is evidence of moderate diffuse encephalopathy, nonspecific etiology.  No seizures were seen throughout the recording.    Lando Alcalde Barbra Sarks

## 2020-01-29 ENCOUNTER — Inpatient Hospital Stay (HOSPITAL_COMMUNITY)
Admission: RE | Admit: 2020-01-29 | Discharge: 2020-02-20 | DRG: 057 | Disposition: A | Discharge status: Skilled Nursing Facility | Payer: Medicare Other | Source: Intra-hospital | Attending: Physical Medicine & Rehabilitation | Admitting: Physical Medicine & Rehabilitation

## 2020-01-29 ENCOUNTER — Other Ambulatory Visit: Payer: Self-pay

## 2020-01-29 ENCOUNTER — Encounter (HOSPITAL_COMMUNITY): Payer: Self-pay | Admitting: Physical Medicine & Rehabilitation

## 2020-01-29 DIAGNOSIS — H60311 Diffuse otitis externa, right ear: Secondary | ICD-10-CM | POA: Diagnosis not present

## 2020-01-29 DIAGNOSIS — I129 Hypertensive chronic kidney disease with stage 1 through stage 4 chronic kidney disease, or unspecified chronic kidney disease: Secondary | ICD-10-CM | POA: Diagnosis not present

## 2020-01-29 DIAGNOSIS — R2689 Other abnormalities of gait and mobility: Secondary | ICD-10-CM | POA: Diagnosis not present

## 2020-01-29 DIAGNOSIS — E871 Hypo-osmolality and hyponatremia: Secondary | ICD-10-CM | POA: Diagnosis not present

## 2020-01-29 DIAGNOSIS — M8580 Other specified disorders of bone density and structure, unspecified site: Secondary | ICD-10-CM | POA: Diagnosis not present

## 2020-01-29 DIAGNOSIS — R410 Disorientation, unspecified: Secondary | ICD-10-CM

## 2020-01-29 DIAGNOSIS — H6061 Unspecified chronic otitis externa, right ear: Secondary | ICD-10-CM | POA: Diagnosis not present

## 2020-01-29 DIAGNOSIS — Z7982 Long term (current) use of aspirin: Secondary | ICD-10-CM

## 2020-01-29 DIAGNOSIS — I6782 Cerebral ischemia: Secondary | ICD-10-CM | POA: Diagnosis not present

## 2020-01-29 DIAGNOSIS — G8929 Other chronic pain: Secondary | ICD-10-CM

## 2020-01-29 DIAGNOSIS — J309 Allergic rhinitis, unspecified: Secondary | ICD-10-CM | POA: Diagnosis not present

## 2020-01-29 DIAGNOSIS — G894 Chronic pain syndrome: Secondary | ICD-10-CM | POA: Diagnosis present

## 2020-01-29 DIAGNOSIS — T50905A Adverse effect of unspecified drugs, medicaments and biological substances, initial encounter: Secondary | ICD-10-CM | POA: Diagnosis not present

## 2020-01-29 DIAGNOSIS — I69991 Dysphagia following unspecified cerebrovascular disease: Secondary | ICD-10-CM | POA: Diagnosis not present

## 2020-01-29 DIAGNOSIS — K921 Melena: Secondary | ICD-10-CM | POA: Diagnosis not present

## 2020-01-29 DIAGNOSIS — R0902 Hypoxemia: Secondary | ICD-10-CM | POA: Diagnosis not present

## 2020-01-29 DIAGNOSIS — I10 Essential (primary) hypertension: Secondary | ICD-10-CM | POA: Diagnosis present

## 2020-01-29 DIAGNOSIS — H04129 Dry eye syndrome of unspecified lacrimal gland: Secondary | ICD-10-CM | POA: Diagnosis not present

## 2020-01-29 DIAGNOSIS — I639 Cerebral infarction, unspecified: Secondary | ICD-10-CM | POA: Diagnosis not present

## 2020-01-29 DIAGNOSIS — M797 Fibromyalgia: Secondary | ICD-10-CM | POA: Diagnosis not present

## 2020-01-29 DIAGNOSIS — I959 Hypotension, unspecified: Secondary | ICD-10-CM | POA: Diagnosis not present

## 2020-01-29 DIAGNOSIS — Z20822 Contact with and (suspected) exposure to covid-19: Secondary | ICD-10-CM | POA: Diagnosis not present

## 2020-01-29 DIAGNOSIS — Z90710 Acquired absence of both cervix and uterus: Secondary | ICD-10-CM | POA: Diagnosis not present

## 2020-01-29 DIAGNOSIS — N390 Urinary tract infection, site not specified: Secondary | ICD-10-CM | POA: Diagnosis not present

## 2020-01-29 DIAGNOSIS — M545 Low back pain, unspecified: Secondary | ICD-10-CM | POA: Diagnosis not present

## 2020-01-29 DIAGNOSIS — K219 Gastro-esophageal reflux disease without esophagitis: Secondary | ICD-10-CM | POA: Diagnosis not present

## 2020-01-29 DIAGNOSIS — G936 Cerebral edema: Secondary | ICD-10-CM | POA: Diagnosis not present

## 2020-01-29 DIAGNOSIS — R1310 Dysphagia, unspecified: Secondary | ICD-10-CM | POA: Diagnosis present

## 2020-01-29 DIAGNOSIS — B961 Klebsiella pneumoniae [K. pneumoniae] as the cause of diseases classified elsewhere: Secondary | ICD-10-CM | POA: Diagnosis not present

## 2020-01-29 DIAGNOSIS — N189 Chronic kidney disease, unspecified: Secondary | ICD-10-CM | POA: Diagnosis not present

## 2020-01-29 DIAGNOSIS — E7800 Pure hypercholesterolemia, unspecified: Secondary | ICD-10-CM | POA: Diagnosis not present

## 2020-01-29 DIAGNOSIS — F419 Anxiety disorder, unspecified: Secondary | ICD-10-CM | POA: Diagnosis not present

## 2020-01-29 DIAGNOSIS — I69318 Other symptoms and signs involving cognitive functions following cerebral infarction: Secondary | ICD-10-CM | POA: Diagnosis not present

## 2020-01-29 DIAGNOSIS — Z79899 Other long term (current) drug therapy: Secondary | ICD-10-CM | POA: Diagnosis not present

## 2020-01-29 DIAGNOSIS — H6093 Unspecified otitis externa, bilateral: Secondary | ICD-10-CM | POA: Diagnosis not present

## 2020-01-29 DIAGNOSIS — M2550 Pain in unspecified joint: Secondary | ICD-10-CM | POA: Diagnosis not present

## 2020-01-29 DIAGNOSIS — I616 Nontraumatic intracerebral hemorrhage, multiple localized: Secondary | ICD-10-CM | POA: Diagnosis not present

## 2020-01-29 DIAGNOSIS — M6281 Muscle weakness (generalized): Secondary | ICD-10-CM | POA: Diagnosis not present

## 2020-01-29 DIAGNOSIS — F411 Generalized anxiety disorder: Secondary | ICD-10-CM | POA: Diagnosis not present

## 2020-01-29 DIAGNOSIS — A499 Bacterial infection, unspecified: Secondary | ICD-10-CM | POA: Diagnosis not present

## 2020-01-29 DIAGNOSIS — K5900 Constipation, unspecified: Secondary | ICD-10-CM | POA: Diagnosis not present

## 2020-01-29 DIAGNOSIS — I619 Nontraumatic intracerebral hemorrhage, unspecified: Principal | ICD-10-CM

## 2020-01-29 DIAGNOSIS — I610 Nontraumatic intracerebral hemorrhage in hemisphere, subcortical: Secondary | ICD-10-CM | POA: Diagnosis not present

## 2020-01-29 DIAGNOSIS — I69351 Hemiplegia and hemiparesis following cerebral infarction affecting right dominant side: Principal | ICD-10-CM

## 2020-01-29 DIAGNOSIS — Z7401 Bed confinement status: Secondary | ICD-10-CM | POA: Diagnosis not present

## 2020-01-29 DIAGNOSIS — N182 Chronic kidney disease, stage 2 (mild): Secondary | ICD-10-CM | POA: Diagnosis not present

## 2020-01-29 DIAGNOSIS — I69151 Hemiplegia and hemiparesis following nontraumatic intracerebral hemorrhage affecting right dominant side: Secondary | ICD-10-CM | POA: Diagnosis not present

## 2020-01-29 DIAGNOSIS — E876 Hypokalemia: Secondary | ICD-10-CM | POA: Diagnosis not present

## 2020-01-29 DIAGNOSIS — I629 Nontraumatic intracranial hemorrhage, unspecified: Secondary | ICD-10-CM | POA: Diagnosis not present

## 2020-01-29 DIAGNOSIS — M1990 Unspecified osteoarthritis, unspecified site: Secondary | ICD-10-CM | POA: Diagnosis not present

## 2020-01-29 DIAGNOSIS — I612 Nontraumatic intracerebral hemorrhage in hemisphere, unspecified: Secondary | ICD-10-CM | POA: Diagnosis not present

## 2020-01-29 DIAGNOSIS — R531 Weakness: Secondary | ICD-10-CM | POA: Diagnosis not present

## 2020-01-29 DIAGNOSIS — I611 Nontraumatic intracerebral hemorrhage in hemisphere, cortical: Secondary | ICD-10-CM | POA: Diagnosis not present

## 2020-01-29 DIAGNOSIS — M5030 Other cervical disc degeneration, unspecified cervical region: Secondary | ICD-10-CM | POA: Diagnosis not present

## 2020-01-29 DIAGNOSIS — R278 Other lack of coordination: Secondary | ICD-10-CM | POA: Diagnosis not present

## 2020-01-29 DIAGNOSIS — G4700 Insomnia, unspecified: Secondary | ICD-10-CM | POA: Diagnosis not present

## 2020-01-29 MED ORDER — PROCHLORPERAZINE 25 MG RE SUPP: 12.5000 mg | Freq: Four times a day (QID) | RECTAL | Status: DC | PRN

## 2020-01-29 MED ORDER — CHLORHEXIDINE GLUCONATE 0.12 % MT SOLN
15.0000 mL | Freq: Two times a day (BID) | OROMUCOSAL | Status: DC
  Administered 2020-01-29 – 2020-02-20 (×32): 15 mL via OROMUCOSAL
  Filled 2020-01-29 (×42): qty 15

## 2020-01-29 MED ORDER — ALUM & MAG HYDROXIDE-SIMETH 200-200-20 MG/5ML PO SUSP: 30.0000 mL | ORAL | Status: DC | PRN

## 2020-01-29 MED ORDER — SENNOSIDES-DOCUSATE SODIUM 8.6-50 MG PO TABS
1.0000 | ORAL_TABLET | Freq: Two times a day (BID) | ORAL | Status: DC
  Administered 2020-01-29 – 2020-02-01 (×6): 1 via ORAL
  Filled 2020-01-29 (×6): qty 1

## 2020-01-29 MED ORDER — SODIUM CHLORIDE 0.9 % IV SOLN
1.0000 g | INTRAVENOUS | Status: AC
  Administered 2020-01-30: 1 g via INTRAVENOUS
  Filled 2020-01-29: qty 10

## 2020-01-29 MED ORDER — POLYETHYLENE GLYCOL 3350 17 G PO PACK
17.0000 g | PACK | Freq: Every day | ORAL | Status: DC | PRN
  Administered 2020-01-30 – 2020-02-01 (×2): 17 g via ORAL
  Filled 2020-01-29 (×2): qty 1

## 2020-01-29 MED ORDER — LORATADINE 10 MG PO TABS
10.0000 mg | ORAL_TABLET | ORAL | Status: DC
  Administered 2020-01-31 – 2020-02-20 (×11): 10 mg via ORAL
  Filled 2020-01-29 (×12): qty 1

## 2020-01-29 MED ORDER — SENNOSIDES-DOCUSATE SODIUM 8.6-50 MG PO TABS: 1.0000 | ORAL_TABLET | Freq: Two times a day (BID) | ORAL | Status: DC

## 2020-01-29 MED ORDER — ACETAMINOPHEN 325 MG PO TABS: 650.0000 mg | ORAL_TABLET | ORAL | Status: DC | PRN

## 2020-01-29 MED ORDER — HYDRALAZINE HCL 10 MG PO TABS
10.0000 mg | ORAL_TABLET | Freq: Four times a day (QID) | ORAL | Status: DC | PRN
  Administered 2020-01-31: 10 mg via ORAL
  Filled 2020-01-29: qty 1

## 2020-01-29 MED ORDER — GUAIFENESIN-DM 100-10 MG/5ML PO SYRP: 5.0000 mL | ORAL_SOLUTION | Freq: Four times a day (QID) | ORAL | Status: DC | PRN

## 2020-01-29 MED ORDER — BISACODYL 10 MG RE SUPP
10.0000 mg | Freq: Every day | RECTAL | Status: DC | PRN
  Administered 2020-02-01: 10 mg via RECTAL
  Filled 2020-01-29: qty 1

## 2020-01-29 MED ORDER — FLUTICASONE PROPIONATE 50 MCG/ACT NA SUSP
2.0000 | Freq: Every day | NASAL | Status: DC
  Administered 2020-01-30 – 2020-02-19 (×20): 2 via NASAL
  Filled 2020-01-29: qty 16

## 2020-01-29 MED ORDER — PROCHLORPERAZINE EDISYLATE 10 MG/2ML IJ SOLN: 5.0000 mg | Freq: Four times a day (QID) | INTRAMUSCULAR | Status: DC | PRN

## 2020-01-29 MED ORDER — TRAZODONE HCL 50 MG PO TABS
25.0000 mg | ORAL_TABLET | Freq: Every evening | ORAL | Status: DC | PRN
  Administered 2020-01-30: 50 mg via ORAL
  Administered 2020-01-31: 25 mg via ORAL
  Administered 2020-02-01 – 2020-02-15 (×11): 50 mg via ORAL
  Filled 2020-01-29 (×14): qty 1

## 2020-01-29 MED ORDER — DIPHENHYDRAMINE HCL 12.5 MG/5ML PO ELIX
12.5000 mg | ORAL_SOLUTION | Freq: Four times a day (QID) | ORAL | Status: DC | PRN
  Administered 2020-02-02: 25 mg via ORAL
  Filled 2020-01-29 (×2): qty 10

## 2020-01-29 MED ORDER — VERAPAMIL HCL ER 180 MG PO TBCR
180.0000 mg | EXTENDED_RELEASE_TABLET | Freq: Every day | ORAL | Status: DC
  Administered 2020-01-30 – 2020-02-20 (×22): 180 mg via ORAL
  Filled 2020-01-29 (×22): qty 1

## 2020-01-29 MED ORDER — BACLOFEN 5 MG PO TABS: 5.0000 mg | ORAL_TABLET | Freq: Two times a day (BID) | ORAL | 0 refills | Status: DC

## 2020-01-29 MED ORDER — PROCHLORPERAZINE MALEATE 5 MG PO TABS
5.0000 mg | ORAL_TABLET | Freq: Four times a day (QID) | ORAL | Status: DC | PRN
  Administered 2020-02-08: 10 mg via ORAL
  Filled 2020-01-29: qty 2

## 2020-01-29 MED ORDER — CHLORHEXIDINE GLUCONATE 0.12 % MT SOLN: 15.0000 mL | Freq: Two times a day (BID) | OROMUCOSAL | 0 refills | Status: DC

## 2020-01-29 MED ORDER — CHLORHEXIDINE GLUCONATE CLOTH 2 % EX PADS: 6.0000 | MEDICATED_PAD | Freq: Every day | CUTANEOUS | Status: DC

## 2020-01-29 MED ORDER — HYDROCODONE-ACETAMINOPHEN 5-325 MG PO TABS: 1.0000 | ORAL_TABLET | Freq: Four times a day (QID) | ORAL | 0 refills | Status: DC | PRN

## 2020-01-29 MED ORDER — SODIUM CHLORIDE 0.9 % IV SOLN: 1.0000 g | INTRAVENOUS | Status: DC

## 2020-01-29 MED ORDER — ACETAMINOPHEN 325 MG PO TABS
325.0000 mg | ORAL_TABLET | ORAL | Status: DC | PRN
  Administered 2020-01-29 – 2020-02-03 (×11): 650 mg via ORAL
  Administered 2020-02-05: 325 mg via ORAL
  Administered 2020-02-08: 650 mg via ORAL
  Administered 2020-02-09: 500 mg via ORAL
  Administered 2020-02-11 – 2020-02-12 (×4): 650 mg via ORAL
  Administered 2020-02-13: 325 mg via ORAL
  Administered 2020-02-15 – 2020-02-18 (×3): 650 mg via ORAL
  Filled 2020-01-29 (×24): qty 2

## 2020-01-29 MED ORDER — PANTOPRAZOLE SODIUM 40 MG PO TBEC
40.0000 mg | DELAYED_RELEASE_TABLET | Freq: Every day | ORAL | Status: DC
  Administered 2020-01-29 – 2020-02-19 (×23): 40 mg via ORAL
  Filled 2020-01-29 (×22): qty 1

## 2020-01-29 MED ORDER — LISINOPRIL 10 MG PO TABS
10.0000 mg | ORAL_TABLET | Freq: Every day | ORAL | Status: DC
  Administered 2020-01-30 – 2020-02-17 (×19): 10 mg via ORAL
  Filled 2020-01-29 (×19): qty 1

## 2020-01-29 MED ORDER — FLEET ENEMA 7-19 GM/118ML RE ENEM: 1.0000 | ENEMA | Freq: Once | RECTAL | Status: DC | PRN

## 2020-01-29 MED ORDER — SODIUM CHLORIDE 0.9 % IV SOLN
1.0000 g | INTRAVENOUS | Status: DC
  Administered 2020-01-29: 1 g via INTRAVENOUS
  Filled 2020-01-29: qty 10

## 2020-01-29 NOTE — Progress Notes (Addendum)
IP rehab admissions - I met with patient and her daughter, Judie Petit, at the bedside.  She lives with her husband who is on a walker.  Daughter will be the caregiver and she is aware of the need for 24/7 care after a rehab stay.  Daughter is currently out of work, but needs insurance by the end of this year.  I gave patient and daughter rehab booklets and explained inpatient rehab stay.  Dtr would like CIR.  I spoke with stroke team and they have cleared patient medically.  Will have rehab MD see patient to determine if ready for CIR admit today.  Call me for questions.  [phone]  I spoke with stroke team and shared concerns from rehab MD.  Patient is med stable and ready for DC to CIR today.  I will admit to CIR today.  [phone]

## 2020-01-29 NOTE — H&P (Signed)
Physical Medicine and Rehabilitation Admission H&P    Chief Complaint  Patient presents with  . Stroke with functional deficits.   HPI: Alison Cuevas is a 74 year old [place] female with history of HTN, anxiety d/o, DDD with chronic pain who was [facility] via [facility] with sudden onset of right hemiparesis and numbness on 01/26/20 that developed while she was working in her garden.  History taken from daughter and chart review due to cognition. CT of head done showing IPH left frontoparietal convexity with subdural extension and 4 mm left-to-right midline shift.  She was transferred to [facility] for management and Cleviprex added for BP greater than 140.  CTA head was negative for LVO or significant stenosis or AVM.  CT venogram was negative for dural sinus thrombosis.  UDS negative for opiates but positive for benzodiazapines.  She also had issues with delirium even past admission requiring IV Haldol.    Dr. Leonie Man felt that bleed was hypertensive in nature and aspirin was discontinued.  Follow-up MRI brain personally reviewed, showing stable large left IPH.  Per report, unchanged 3.6 cm IPH in superior left hemisphere with moderate edema and small foci of acute ischemia within right MCA/PCA watershed zone.  Echocardiogram with EF of 65-70%. Normal LVH and no wall abnormality.  Patient has had issues with right extremity pain as well as spasms due to increase in tone.  Baclofen was added yesterday to help with spasticity.  Family also expresses some concerns of mild lethargy yesterday therefore urine culture was ordered and positive for Klebsiella.  Therefore started on ceftriaxone today.  She is noted to be more lethargic with family reporting agitation as well as confusion today.  Therapy evaluations revealed right visual field deficits with right inattention, delay in processing, right hemiplegia with tone as well as pusher tendency affecting ADLs and mobility.  CIR was recommended due to  functional decline. Please see preadmission assessment from earlier today as well.   Review of Systems  Unable to perform ROS: Mental acuity   Past Medical History:  Diagnosis Date  . Allergic rhinitis    maple pollen  . Anxiety   . Arthritis   . Bowel obstruction ([place])   . Chronic low back pain    Dr. Trenton Gammon.  Has had back injection--bp elevated after.  . Chronic pain syndrome   . Chronic renal insufficiency, stage 2 (mild)    GFR 60-70  . DDD (degenerative disc disease), cervical    Hx of ACDF (Dr. [name]).  Followed by Dr. [name].  Also, Dr. [name] do left C7-T1 intralaminal epidural injection.  . Diverticulosis of colon   . DJD (degenerative joint disease)   . Gallstones   . GERD (gastroesophageal reflux disease)   . Herpes zoster 07/08/2014  . Hypercholesteremia    mild; pt declined statin trial 05/2014--needs recheck lipid panel at first f/u visit in 2017  . Hypertension   . Osteopenia 06/2017   T score -1.4 FRAX 15% / 2%  . Ovarian cyst 2017   [id number] assisted bilatel SPO: all path benign.  . Perianal dermatitis    prn cutivate  . Phlebitis   . Right knee injury 2018   Patellofemoral crush injury--Dr. [name].  . Trochanteric bursitis of right hip    Recurrent (injection by Dr. [name] 05/26/15)  . Tympanic membrane rupture, right 12/01/2016   As of 08/2018 pt set for tympanomastoidectomy and STSG (Dr. [name]). Recurrent fungal/bact OE.  . Varicose vein  left leg     Past Surgical History:  Procedure Laterality Date  . ABDOMINAL HYSTERECTOMY  03/2015   TAH.  Last pap 2016.  No hx of abnl paps.  Per GYN, no further pap smears indicated.  . ANTERIOR AND POSTERIOR REPAIR N/A 03/18/2014   Procedure: Cystocele repair with graft, Vault suspension, Rectocele repair;  Surgeon: [name], MD;  Location: [facility];  Service: Urology;  Laterality: N/A;  . CHOLECYSTECTOMY    . CHOLECYSTECTOMY, LAPAROSCOPIC    . COLON SURGERY    .  COLONOSCOPY  04/2006; 05/26/16   2007 (Dr. [name]): Normal.  04/2016 (Dr. [name]) normal except diverticulosis and decreased anal sphincter tone.  No repeat colonoscopy is recommended due to age.  . cspine surgery     Dr. Wiliam Ke level ant cerv discectomy /fusion w/plating  . CYSTO N/A 03/18/2014   Procedure: CYSTO;  Surgeon: [name], MD;  Location: [facility];  Service: Urology;  Laterality: N/A;  . DEXA  07/30/2015; 06/2018   2017 and 2020 -->Osteopenia--repeat 2 yrs.  Marland Kitchen LYSIS OF ADHESION N/A 01/31/2018   Procedure: POSSIBLE LYSIS OF ADHESION;  Surgeon: Everitt Amber, MD;  Location: [facility];  Service: Gynecology;  Laterality: N/A;  . [place]     with prolasped bledder repair  . RESECTION OF COLON     BENIGN TUMOR  . RIGHT EAR SURGERY  08/23/2017   Dr. 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 right ear surgery  12/12/2018   Right tympanomastoidectomy, ossiculoplasty with partial prosthesis, split thickness skin graft from postauricular skin 1x1cm, and right tragal cartilage graft 1x1 cm [facility])  . right hemicolectomy for diverticulitis with abscess  1993  . ROBOTIC ASSISTED BILATERAL SALPINGO OOPHERECTOMY Bilateral 01/31/2018   All PATH benign.  Procedure: XI ROBOTIC ASSISTED BILATERAL SALPINGO OOPHORECTOMY;  Surgeon: Everitt Amber, MD;  Location: [facility];  Service: Gynecology;  Laterality: Bilateral;  . TUBAL LIGATION      Family History  Problem Relation Age of Onset  . Hypertension Mother   . Diverticulitis Mother   . Breast cancer Sister 65  . Melanoma Brother   . Prostate cancer Brother   . Leukemia Brother   . Lupus Sister   . Lung disease Brother   . Stomach cancer Father   . Other Other        Family member with MGUS    Social History:  reports that she has never smoked. She has never used smokeless tobacco. She reports that  she does not drink alcohol and does not use drugs.    Allergies  Allergen Reactions  . Gabapentin Other (See Comments)    Elevated heart rate and crazy dreams  . Prednisone Other (See Comments)    REACTION: funny feeling    Medications Prior to Admission  Medication Sig Dispense Refill  . ALPRAZolam (XANAX) 0.5 MG tablet TAKE 1 TABLET BY MOUTH THREE TIMES A DAY AS NEEDED FOR ANXIETY (Patient taking differently: Take 0.5 mg by mouth as needed for anxiety. ) 90 tablet 5  . aspirin (ASPIRIN 81) 81 MG EC tablet Take 81 mg by mouth daily.     . Biotin 10000 MCG TABS Take 10,000 mcg by mouth daily.     . calcium carbonate (OSCAL) 1500 (600 Ca) MG TABS tablet Take 600 mg of elemental calcium by mouth daily with breakfast.    .  Carboxymethylcellul-Glycerin (LUBRICATING EYE DROPS OP) Place 1 drop into both eyes daily as needed (dry eyes).    . Cholecalciferol (VITAMIN D3) 5000 units CAPS Take 5,000 Units by mouth daily.    . famotidine (PEPCID) 40 MG tablet TAKE 1 TABLET BY MOUTH TWICE A DAY (Patient taking differently: Take 40 mg by mouth as needed for heartburn or indigestion. ) 180 tablet 1  . fluticasone (FLONASE) 50 MCG/ACT nasal spray Place 2 sprays into both nostrils at bedtime. 48 g 3  . Lactobacillus-Inulin (CULTURELLE DIGESTIVE HEALTH PO) Take 1 tablet by mouth daily as needed (upset stomach).     . lisinopril (ZESTRIL) 10 MG tablet TAKE 1 TABLET BY MOUTH EVERY DAY (Patient taking differently: Take 10 mg by mouth daily. ) 90 tablet 1  . loratadine (CLARITIN) 10 MG tablet Take 10 mg by mouth every other day.     . meclizine (ANTIVERT) 12.5 MG tablet 1-2 tabs po tid prn (Patient taking differently: Take 12.5 mg by mouth as needed for dizziness. ) 60 tablet 1  . meloxicam (MOBIC) 15 MG tablet TAKE 1 TABLET BY MOUTH EVERY DAY WITH FOOD AS NEEDED FOR MUSCULOSKELETAL PAIN (Patient taking differently: Take 15 mg by mouth as needed for pain (musculoskeletal pain). ) 30 tablet 6  . Multiple  Minerals-Vitamins (CALCIUM-MAGNESIUM-ZINC-D3 PO) Take 1 tablet by mouth daily.     . Multiple Vitamins-Minerals (MULTIVITAMIN ADULT) TABS Take by mouth daily.    . ondansetron (ZOFRAN) 4 MG tablet Take 1 tablet (4 mg total) by mouth every 8 (eight) hours as needed for nausea or vomiting. (Patient taking differently: Take 4 mg by mouth as needed for nausea or vomiting. ) 30 tablet 1  . POTASSIUM PO Take 1 tablet by mouth daily.     Marland Kitchen PREVIDENT 5000 BOOSTER PLUS 1.1 % PSTE Place 1 strip onto teeth 3 (three) times daily. After use, spit out but do not rinse.  6  . Simethicone (GAS-X PO) Take 1 tablet by mouth daily as needed (gas).    . verapamil (CALAN-SR) 180 MG CR tablet 1 tab po qd (Patient taking differently: Take 180 mg by mouth daily. ) 90 tablet 3  . vitamin B-12 (CYANOCOBALAMIN) 1000 MCG tablet Take 2,000 mcg by mouth daily.    . fluticasone (CUTIVATE) 0.05 % cream Apply to affected area bid prn (Patient not taking: Reported on 09/25/2019) 30 g 1  . HYDROcodone-acetaminophen (NORCO/VICODIN) 5-325 MG tablet 1 tab po tid prn pain (Patient not taking: Reported on 01/27/2020) 90 tablet 0  . triamcinolone ointment (KENALOG) 0.1 % Apply 1 application topically 2 (two) times daily. (Patient not taking: Reported on 01/01/2020) 30 g 1    Drug Regimen Review  Drug regimen was reviewed and remains appropriate with no significant issues identified  Home: Home Living Family/patient expects to be discharged to:: Inpatient rehab Living Arrangements: Spouse/significant other Available Help at Discharge: Family, Available 24 hours/day Type of Home: House Home Access: Stairs to enter [employer] of Steps: 3 Entrance Stairs-Rails: Right, Left, Can reach both Home Layout: One level Home Equipment: Shower seat, Bedside commode, Environmental consultant - 2 wheels, Walker - 4 wheels, [place] - single point Additional Comments: husband is not in good health, pt needs to be able to take care of herself  Lives With:  Spouse   Functional History: Prior Function Level of Independence: Independent Comments: Out in the yard putting out feed for the deer when this occurred, does IADLs, drives.  Functional Status:  Mobility: Bed Mobility Overal  bed mobility: Needs Assistance Bed Mobility: Rolling, Sidelying to Sit, Sit to Sidelying Rolling: Max assist Sidelying to sit: Max assist, +2 for physical assistance Supine to sit: Total assist, +2 for physical assistance Sit to sidelying: Total assist, +2 for safety/equipment, +2 for physical assistance General bed mobility comments: total assist to reposition to midline, slight L lateral lean with pillows under UEs for support  Transfers Overall transfer level: Needs assistance Equipment used: 2 person hand held assist Transfer via Lift Equipment: Stedy Transfers: Sit to/from Stand, Risk manager Sit to Stand: Max assist, +2 physical assistance, +2 safety/equipment Stand pivot transfers: Total assist, +2 physical assistance General transfer comment: used Stedy to allow better control of rt foot and ankle Ambulation/Gait General Gait Details: Unable    ADL: ADL Overall ADL's : Needs assistance/impaired Eating/Feeding: Moderate assistance, Bed level Grooming: Wash/dry face, Bed level, Set up, Supervision/safety Upper Body Bathing: Maximal assistance, Bed level Lower Body Bathing: Total assistance, Bed level Upper Body Dressing : Total assistance, Bed level Lower Body Dressing: Total assistance, Bed level Toilet Transfer: +2 for physical assistance, Stand-pivot, Total assistance Toilet Transfer Details (indicate cue type and reason): bed>recliner going to pt's right Toileting- Clothing Manipulation and Hygiene: Total assistance Toileting - Clothing Manipulation Details (indicate cue type and reason): Max A +2 sit<>stand at EOB  Cognition: Cognition Overall Cognitive Status: Impaired/Different from baseline Arousal/Alertness:   (drowsy) Orientation Level: Oriented to person, Oriented to place, Disoriented to time, Disoriented to situation Attention: Sustained Sustained Attention: Impaired Sustained Attention Impairment: Verbal basic Memory: Impaired Memory Impairment: Storage deficit, Retrieval deficit, Decreased recall of new information Awareness: Impaired Awareness Impairment: Intellectual impairment Problem Solving: Impaired Problem Solving Impairment: Verbal complex Safety/Judgment: Impaired Cognition Arousal/Alertness: Lethargic Behavior During Therapy: Flat affect, Anxious, Agitated Overall Cognitive Status: Impaired/Different from baseline Area of Impairment: Orientation, Following commands, Problem solving, Awareness, Memory Orientation Level: Disoriented to, Place, Time, Situation Current Attention Level: Sustained Following Commands: Follows one step commands inconsistently, Follows one step commands with increased time Safety/Judgement: Decreased awareness of safety, Decreased awareness of deficits Awareness: Intellectual Problem Solving: Slow processing, Decreased initiation, Difficulty sequencing, Requires verbal cues, Requires tactile cues General Comments: patient anxious and tearful upon entry, reporting "I just don't know what is going on" when re-oriented to place she reports "I don't know why I'm here"    Blood pressure (!) 142/70, pulse 87, temperature 98.6 F (37 C), temperature source Oral, resp. rate 16, height 5' (1.524 m), weight 59 kg, last menstrual period 06/27/1972, SpO2 96 %. Physical Exam Vitals and nursing note reviewed.  Constitutional:      General: She is not in acute distress.    Comments: Frail.  HENT:     Head: Normocephalic and atraumatic.     Right Ear: External ear normal.     Left Ear: External ear normal.     Nose: Nose normal.  Eyes:     General: No scleral icterus.       Right eye: No discharge.        Left eye: No discharge.     Comments: Limited EOM   Cardiovascular:     Rate and Rhythm: Normal rate and regular rhythm.  Pulmonary:     Effort: Pulmonary effort is normal. No respiratory distress.     Breath sounds: No stridor.  Abdominal:     General: Abdomen is flat. Bowel sounds are normal. There is no distension.  Musculoskeletal:     Cervical back: Normal range of motion and neck supple.  Comments: No edema or tenderness in extremities  Skin:    General: Skin is warm and dry.  Neurological:     Mental Status: She is alert.     Comments: Alert and oriented x1 Right facial weakness Extensor tone RLE.  Motor: RUE/RLE: 0/5 proximal distal Right neglect LUE: 4-/5 proximal distal RLE: 2/5 proximal distal midsize inhibition)  Psychiatric:     Comments: Limited due to mentation, disengaged, limited eye contact    Results for orders placed or performed during the hospital encounter of 01/26/20 (from the past 48 hour(s))  Lipid panel     Status: None   Collection Time: 01/28/20  3:41 AM  Result Value Ref Range   Cholesterol 163 0 - 200 mg/dL   Triglycerides 33 <150 mg/dL   HDL 75 >40 mg/dL   Total CHOL/HDL Ratio 2.2 RATIO   VLDL 7 0 - 40 mg/dL   LDL Cholesterol 81 0 - 99 mg/dL    Comment:        Total Cholesterol/HDL:CHD Risk Coronary Heart Disease Risk Table                     Men   Women  1/2 Average Risk   3.4   3.3  Average Risk       5.0   4.4  2 X Average Risk   9.6   7.1  3 X Average Risk  23.4   11.0        Use the calculated Patient Ratio above and the CHD Risk Table to determine the patient's CHD Risk.        ATP III CLASSIFICATION (LDL):  <100     mg/dL   Optimal  100-129  mg/dL   Near or Above                    Optimal  130-159  mg/dL   Borderline  160-189  mg/dL   High  >190     mg/dL   Very High Performed at [facility] [address], [place], [place] [address]   Hemoglobin A1c     Status: None   Collection Time: 01/28/20  3:41 AM  Result Value Ref Range   Hgb A1c MFr Bld 5.0 4.8 -  5.6 %    Comment: (NOTE) Pre diabetes:          5.7%-6.4%  Diabetes:              >6.4%  Glycemic control for   <7.0% adults with diabetes    Mean Plasma Glucose 96.8 mg/dL    Comment: Performed at [facility] [address], [place], [place] [address]   MR BRAIN W WO CONTRAST  Result Date: 01/27/2020 CLINICAL DATA:  Stroke follow-up EXAM: MRI HEAD WITHOUT AND WITH CONTRAST TECHNIQUE: Multiplanar, multiecho pulse sequences of the brain and surrounding structures were obtained without and with intravenous contrast. CONTRAST:  93mL GADAVIST GADOBUTROL 1 MMOL/ML IV SOLN COMPARISON:  Head CT 01/26/2020 FINDINGS: Brain: Unchanged 3.6 cm intraparenchymal hematoma in the superior left hemisphere. There is moderate surrounding edema. No midline shift or other mass effect. There is no abnormal contrast enhancement. Multifocal white matter hyperintensity, most commonly due to chronic ischemic microangiopathy. There are small foci of acute ischemia within the right MCA/PCA watershed zone. No chronic microhemorrhage. Normal midline structures. Vascular: Normal flow voids. Skull and upper cervical spine: Normal marrow signal. Sinuses/Orbits: Right mastoid fluid. Paranasal sinuses are clear.  Normal orbits. Other: None IMPRESSION: 1. Unchanged 3.6 cm intraparenchymal hematoma in the superior left hemisphere with moderate surrounding edema. No midline shift. 2. Small foci of acute ischemia within the right MCA/PCA watershed zone. Electronically Signed   By: Ulyses Jarred M.D.   On: 01/27/2020 20:13   EEG adult  Result Date: 01/28/2020 Lora Havens, MD     01/28/2020  2:00 PM Patient Name: Alison Cuevas MRN: [id number] Epilepsy Attending: Lora Havens Referring Physician/Provider: Burnetta Sabin, NP Date: 01/28/2020 Duration: 25.28 minutes Patient history: 74 year old female with left hemisphere IPH.  EEG 12 for seizures. Level of alertness: Awake, asleep AEDs during EEG study: None Technical aspects: This  EEG study was done with scalp electrodes positioned according to the 10-20 International system of electrode placement. Electrical activity was acquired at a sampling rate of 500Hz  and reviewed with a high frequency filter of 70Hz  and a low frequency filter of 1Hz . EEG data were recorded continuously and digitally stored. Description: No clear posterior dominant rhythm was seen.  Sleep was characterized by vertex waves, sleep spindles (12 to 14 Hz), maximal frontocentral region.  EEG showed continuous generalized and lateralized left hemisphere 3 to 6 Hz theta-delta slowing.  Sharp waves were also seen in left frontal region. Hyperventilation and photic stimulation were not performed.   ABNORMALITY -Sharp wave, left frontal region -Continuous slow, generalized and lateralized left hemisphere IMPRESSION: This study showed evidence of potential epileptogenicity arising from left frontal region as well as cortical dysfunction in left hemisphere likely secondary to underlying hemorrhage.  Additionally there is evidence of moderate diffuse encephalopathy, nonspecific etiology.  No seizures were seen throughout the recording.  Lora Havens   ECHOCARDIOGRAM COMPLETE  Result Date: 01/28/2020    ECHOCARDIOGRAM REPORT   Patient Name:   Alison Cuevas Date of Exam: 01/27/2020 Medical Rec #:  [id number]     Height:       60.0 in Accession #:    [id number]    Weight:       130.0 lb Date of Birth:  1945-12-13      BSA:          1.554 m Patient Age:    14 years      BP:           129/59 mmHg Patient Gender: F             HR:           93 bpm. Exam Location:  Inpatient Procedure: 2D Echo, Cardiac Doppler and Color Doppler Indications:    Stroke 434.9 / I163.9  History:        Patient has no prior history of Echocardiogram examinations.                 Risk Factors:Hypertension and Dyslipidemia. CKD. GERD.  Sonographer:    Jonelle Sidle Dance Referring Phys: [place]  1. Left ventricular ejection fraction, by  estimation, is 65 to 70%. The left ventricle has normal function. The left ventricle has no regional wall motion abnormalities. Left ventricular diastolic parameters are indeterminate.  2. Right ventricular systolic function is normal. The right ventricular size is normal. Tricuspid regurgitation signal is inadequate for assessing PA pressure.  3. The mitral valve is normal in structure. No evidence of mitral valve regurgitation. No evidence of mitral stenosis.  4. The aortic valve is tricuspid. Aortic valve regurgitation is not visualized. No aortic stenosis is present.  5. The inferior vena cava is normal  in size with greater than 50% respiratory variability, suggesting right atrial pressure of 3 mmHg.  6. Increased flow velocities across LVOT, AV, and PV maybe secondary to anemia, thyrotoxicosis, hyperdynamic or high flow state. Conclusion(s)/Recommendation(s): No intracardiac source of embolism detected on this transthoracic study. A transesophageal echocardiogram is recommended to exclude cardiac source of embolism if clinically indicated. FINDINGS  Left Ventricle: Left ventricular ejection fraction, by estimation, is 65 to 70%. The left ventricle has normal function. The left ventricle has no regional wall motion abnormalities. The left ventricular internal cavity size was normal in size. There is  no left ventricular hypertrophy. Left ventricular diastolic parameters are indeterminate. Right Ventricle: The right ventricular size is normal. No increase in right ventricular wall thickness. Right ventricular systolic function is normal. Tricuspid regurgitation signal is inadequate for assessing PA pressure. Left Atrium: Left atrial size was normal in size. Right Atrium: Right atrial size was normal in size. Pericardium: A small pericardial effusion is present. The pericardial effusion is anterior to the right ventricle. Mitral Valve: The mitral valve is normal in structure. Normal mobility of the mitral valve  leaflets. No evidence of mitral valve regurgitation. No evidence of mitral valve stenosis. Tricuspid Valve: The tricuspid valve is normal in structure. Tricuspid valve regurgitation is not demonstrated. No evidence of tricuspid stenosis. Aortic Valve: The aortic valve is tricuspid. Aortic valve regurgitation is not visualized. No aortic stenosis is present. Pulmonic Valve: The pulmonic valve was grossly normal. Pulmonic valve regurgitation is trivial. No evidence of pulmonic stenosis. Aorta: The aortic root is normal in size and structure. Venous: The inferior vena cava is normal in size with greater than 50% respiratory variability, suggesting right atrial pressure of 3 mmHg. IAS/Shunts: No atrial level shunt detected by color flow Doppler.  LEFT VENTRICLE PLAX 2D LVIDd:         3.45 cm  Diastology LVIDs:         2.10 cm  LV e' lateral: 5.59 cm/s LV PW:         0.90 cm  LV e' medial:  5.41 cm/s LV IVS:        1.20 cm LVOT diam:     1.90 cm LV SV:         94 LV SV Index:   60 LVOT Area:     2.84 cm  RIGHT VENTRICLE             IVC RV Basal diam:  2.30 cm     IVC diam: 1.70 cm RV S prime:     18.60 cm/s TAPSE (M-mode): 1.6 cm LEFT ATRIUM           Index       RIGHT ATRIUM           Index LA diam:      3.30 cm 2.12 cm/m  RA Area:     10.70 cm LA Vol (A2C): 32.5 ml 20.91 ml/m RA Volume:   24.00 ml  15.44 ml/m LA Vol (A4C): 25.5 ml 16.41 ml/m  AORTIC VALVE LVOT Vmax:   182.00 cm/s LVOT Vmean:  131.000 cm/s LVOT VTI:    0.331 m  AORTA Ao Root diam: 3.90 cm MV A velocity: 109.50 cm/s                             SHUNTS  Systemic VTI:  0.33 m                             Systemic Diam: 1.90 cm Cherlynn Kaiser MD Electronically signed by Cherlynn Kaiser MD Signature Date/Time: 01/28/2020/1:13:37 AM    Final        Medical Problem List and Plan: 1. Right visual field deficits with right inattention, delay in processing, right hemiplegia with tone as well as pusher tendency affecting ADLs  and mobility secondary to left hemisphere IPH with moderate edema and small foci of acute ischemia within right MCA/PCA watershed zone infarcts.  -patient may shower  -ELOS/Goals: 28-32 days/Min A  Admit to CIR 2.  Antithrombotics: -DVT/anticoagulation:  Mechanical: Sequential compression devices, below knee Bilateral lower extremities  -antiplatelet therapy: N/A 3. Chronic neck pain/Pain Management: Uses  Hydrocodone prn at home--will hold for now till mentation clears.   Monitor with increased exertion 4. Mood: LCSW to follow for evaluation and support.   -antipsychotic agents: N/A 5. Neuropsych: This patient is not capable of making decisions on her own behalf. 6. Skin/Wound Care: Routine pressure relief measures.  7. Fluids/Electrolytes/Nutrition: IVF ordered as intake poor due to lethargy. CMP ordered for tomorrow AM. 8. HTN: Monitor BP tid  Verapamil and lisinopril.   Monitor with increased mobility 9. Kleb pneumonia UTI: Ceftriaxone Day #1  Follow sensitivities.  10. Delirum: Received multiple doses of haldol on 08/02 but was brighter and interactive on 08/03 am. Now flat --family reporting agitation with bouts of lability today. D/c baclofen and vicodin as this in addition to UTI likely contributing to cognitive issues.    11. Hyponatremia: Baseline--appears to be chronic.   CMP ordered for tomorrow AM  Bary Leriche, PA-C 01/29/2020  I have personally performed a face to face diagnostic evaluation, including, but not limited to relevant history and physical exam findings, of this patient and developed relevant assessment and plan.  Additionally, I have reviewed and concur with the physician assistant's documentation above.  Delice Lesch, MD, ABPMR  The patient's status has not changed. The original post admission physician evaluation remains appropriate, and any changes from the pre-admission screening or documentation from the acute chart are noted above.   Delice Lesch, MD,  ABPMR

## 2020-01-29 NOTE — Discharge Summary (Addendum)
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 Active Problems:   Anxiety state   GERD   DJD (degenerative joint disease)   Chronic low back pain   Hypertension   Fibromyalgia   UTI (urinary tract infection)   Conductive hearing loss of right ear with unrestricted hearing of left ear   Medications to be continued on Rehab Allergies as of 01/29/2020       Reactions   Gabapentin Other (See Comments)   Elevated heart rate and crazy dreams   Prednisone Other (See Comments)   REACTION: funny feeling        Medication List     STOP taking these medications    Aspirin 81 81 MG EC tablet Generic drug: aspirin   fluticasone 0.05 % cream Commonly known as: CUTIVATE   meloxicam 15 MG tablet Commonly known as: MOBIC   triamcinolone ointment 0.1 % Commonly known as: KENALOG       TAKE these medications    acetaminophen 325 MG tablet Commonly known as: TYLENOL Take 2 tablets (650 mg total) by mouth every 4 (four) hours as needed for mild pain (or temp > 37.5 C (99.5 F)).   ALPRAZolam 0.5 MG tablet Commonly known as: XANAX TAKE 1 TABLET BY MOUTH THREE TIMES A DAY AS NEEDED FOR ANXIETY What changed: See the new instructions.   Baclofen 5 MG Tabs Take 5 mg by mouth 2 (two) times daily.   Biotin 10000 MCG Tabs Take 10,000 mcg by mouth daily.   calcium carbonate 1500 (600 Ca) MG Tabs tablet Commonly known as: OSCAL Take 600 mg of elemental calcium by mouth daily with breakfast.   CALCIUM-MAGNESIUM-ZINC-D3 PO Take 1  tablet by mouth daily.   cefTRIAXone 1 g in sodium chloride 0.9 % 100 mL Inject 1 g into the vein daily for 3 days.   chlorhexidine 0.12 % solution Commonly known as: PERIDEX 15 mLs by Mouth Rinse route 2 (two) times daily.   CULTURELLE DIGESTIVE HEALTH PO Take 1 tablet by mouth daily as needed (upset stomach).   famotidine 40 MG tablet Commonly known as: PEPCID TAKE 1 TABLET BY MOUTH TWICE A DAY What changed:  when to take this reasons to take this   fluticasone 50 MCG/ACT nasal spray Commonly known as: FLONASE Place 2 sprays into both nostrils at bedtime.   GAS-X PO Take 1 tablet by mouth daily as needed (gas).   HYDROcodone-acetaminophen 5-325 MG tablet Commonly known as: NORCO/VICODIN Take 1 tablet by mouth every 6 (six) hours as needed for moderate pain. What changed:  how much to take how to take this when to take this reasons to take this additional instructions   lisinopril 10 MG tablet Commonly known as: ZESTRIL TAKE 1 TABLET BY MOUTH EVERY DAY   loratadine 10 MG tablet Commonly known as: CLARITIN Take 10 mg by mouth every other day.   LUBRICATING EYE DROPS OP Place 1 drop into both eyes daily as needed (dry eyes).   meclizine 12.5 MG tablet Commonly known as: ANTIVERT 1-2 tabs  po tid prn What changed:  how much to take how to take this when to take this reasons to take this additional instructions   Multivitamin Adult Tabs Take by mouth daily.   ondansetron 4 MG tablet Commonly known as: ZOFRAN Take 1 tablet (4 mg total) by mouth every 8 (eight) hours as needed for nausea or vomiting. What changed: when to take this   POTASSIUM PO Take 1 tablet by mouth daily.   PreviDent 5000 Booster Plus 1.1 % Pste Generic drug: Sodium Fluoride Place 1 strip onto teeth 3 (three) times daily. After use, spit out but do not rinse.   senna-docusate 8.6-50 MG tablet Commonly known as: Senokot-S Take 1 tablet by mouth 2 (two) times daily.   verapamil  180 MG CR tablet Commonly known as: CALAN-SR 1 tab po qd What changed:  how much to take how to take this when to take this additional instructions   vitamin B-12 1000 MCG tablet Commonly known as: CYANOCOBALAMIN Take 2,000 mcg by mouth daily.   Vitamin D3 125 MCG (5000 UT) Caps Take 5,000 Units by mouth daily.        LABORATORY STUDIES CBC    Component Value Date/Time   WBC 6.8 01/26/2020 1918   RBC 4.19 01/26/2020 1918   HGB 12.9 01/26/2020 1918   HCT 37.5 01/26/2020 1918   PLT 173 01/26/2020 1918   MCV 89.5 01/26/2020 1918   MCH 30.8 01/26/2020 1918   MCHC 34.4 01/26/2020 1918   RDW 12.7 01/26/2020 1918   LYMPHSABS 3.8 01/26/2020 1918   MONOABS 0.4 01/26/2020 1918   EOSABS 0.1 01/26/2020 1918   BASOSABS 0.0 01/26/2020 1918   CMP    Component Value Date/Time   NA 133 (L) 01/26/2020 1918   K 3.8 01/26/2020 1918   CL 100 01/26/2020 1918   CO2 24 01/26/2020 1918   GLUCOSE 133 (H) 01/26/2020 1918   BUN 17 01/26/2020 1918   CREATININE 0.96 01/26/2020 1918   CREATININE 0.82 12/19/2017 1207   CALCIUM 9.0 01/26/2020 1918   PROT 7.1 01/26/2020 1918   ALBUMIN 4.3 01/26/2020 1918   AST 32 01/26/2020 1918   ALT 26 01/26/2020 1918   ALKPHOS 71 01/26/2020 1918   BILITOT 0.4 01/26/2020 1918   GFRNONAA 58 (L) 01/26/2020 1918   GFRAA >60 01/26/2020 1918   COAGS Lab Results  Component Value Date   INR 1.0 01/26/2020   INR 1.02 03/14/2014   Lipid Panel    Component Value Date/Time   CHOL 163 01/28/2020 0341   TRIG 33 01/28/2020 0341   HDL 75 01/28/2020 0341   CHOLHDL 2.2 01/28/2020 0341   VLDL 7 01/28/2020 0341   LDLCALC 81 01/28/2020 0341   HgbA1C  Lab Results  Component Value Date   HGBA1C 5.0 01/28/2020   Urinalysis    Component Value Date/Time   COLORURINE YELLOW 01/26/2020 2040   APPEARANCEUR HAZY (A) 01/26/2020 2040   LABSPEC 1.009 01/26/2020 2040   PHURINE 7.0 01/26/2020 2040   GLUCOSEU NEGATIVE 01/26/2020 2040   GLUCOSEU NEGATIVE  12/09/2013 1440   HGBUR SMALL (A) 01/26/2020 2040   BILIRUBINUR NEGATIVE 01/26/2020 2040   BILIRUBINUR Neg 12/10/2013 1407   [place] 01/26/2020 2040   PROTEINUR NEGATIVE 01/26/2020 2040   UROBILINOGEN 0.2 07/30/2014 1528   NITRITE NEGATIVE 01/26/2020 2040   LEUKOCYTESUR LARGE (A) 01/26/2020 2040   Urine Drug Screen     Component Value Date/Time   LABOPIA NONE DETECTED 01/26/2020 2040   COCAINSCRNUR NONE DETECTED  01/26/2020 2040   LABBENZ POSITIVE (A) 01/26/2020 2040   AMPHETMU NONE DETECTED 01/26/2020 2040   THCU NONE DETECTED 01/26/2020 2040   LABBARB NONE DETECTED 01/26/2020 2040    Alcohol Level    Component Value Date/Time   ETH <10 01/26/2020 1918    SIGNIFICANT DIAGNOSTIC STUDIES CT Angio Head W or Wo Contrast  Result Date: 01/26/2020 CLINICAL DATA:  Initial evaluation for acute intracranial hemorrhage. EXAM: CT ANGIOGRAPHY HEAD AND NECK CT VENOGRAM HEAD TECHNIQUE: Multidetector CT imaging of the head and neck was performed using the standard protocol during bolus administration of intravenous contrast. Multiplanar CT image reconstructions and MIPs were obtained to evaluate the vascular anatomy. Carotid stenosis measurements (when applicable) are obtained utilizing NASCET criteria, using the distal internal carotid diameter as the denominator. CONTRAST:  36mL OMNIPAQUE IOHEXOL 350 MG/ML SOLN COMPARISON:  Prior CT from earlier the same day. FINDINGS: CTA NECK FINDINGS Aortic arch: Visualized aortic arch of normal caliber with normal 3 vessel morphology. Mild atheromatous change within the arch itself. No hemodynamically significant stenosis seen about the origin of the great vessels. Right carotid system: Right common carotid artery widely patent from its origin to the bifurcation. No significant atheromatous stenosis about the right bifurcation. Right ICA widely patent distally to the skull base without stenosis, dissection or occlusion. Left carotid system: Left common  carotid artery widely patent from its origin to the bifurcation without stenosis. Minimal calcified plaque about the left bifurcation without stenosis. Left ICA widely patent distally to the skull base without stenosis, dissection or occlusion. Vertebral arteries: Both vertebral arteries arise from the subclavian arteries. No proximal subclavian artery stenosis. Left vertebral artery dominant. Vertebral arteries patent within the neck without stenosis, dissection or occlusion. Skeleton: No acute osseous abnormality. No discrete lytic or blastic osseous lesions. Prior ACDF at C3-C6 without adverse features. Other neck: No other acute soft tissue abnormality within the neck. Chronic left sphenoid sinusitis noted. No mass lesion or adenopathy. 8 mm right thyroid nodule noted, felt to be of doubtful significance given size and patient age. No follow-up imaging recommended regarding this lesion. Upper chest: Visualized upper chest demonstrates no acute finding. Review of the MIP images confirms the above findings CTA HEAD FINDINGS Anterior circulation: Petrous segments widely patent bilaterally. Mild atheromatous change seen within the cavernous/supraclinoid ICAs without stenosis or other abnormality. ICA termini well perfused. Right A1 widely patent. Left A1 hypoplastic but patent as well. Normal anterior communicating artery complex. Anterior cerebral arteries widely patent to their distal aspects without stenosis. No M1 stenosis or occlusion. Normal MCA bifurcations. Distal MCA branches well perfused and symmetric. Posterior circulation: Vertebral arteries widely patent to the vertebrobasilar junction without stenosis. Left vertebral artery dominant. Patent right PICA. Left PICA not definitely seen. Basilar widely patent to its distal aspect without stenosis. Superior cerebral arteries patent bilaterally. Right PCA supplied via the basilar. Fetal type origin left PCA. Both PCAs well perfused to their distal aspects  without stenosis. No intracranial aneurysm. No vascular abnormality seen underlying the left frontoparietal hemorrhage. Venous sinuses: Normal opacification seen throughout the superior sagittal sinus to the torcula. Transverse and sigmoid sinuses are patent as are the proximal internal jugular veins. Straight sinus, vein of Galen, internal cerebral veins, and basal veins of Rosenthal patent. No evidence for dural sinus thrombosis. Anatomic variants: Hypoplastic left A1 segment. Fetal type origin of the left PCA. Review of the MIP images confirms the above findings IMPRESSION: 1. Negative CTA of the head and neck. No large vessel occlusion,  hemodynamically significant stenosis, or other acute vascular abnormality. No vascular abnormality seen underlying the left cerebral hemorrhage. 2. Negative CT venogram.  No evidence for dural sinus thrombosis. 3. Chronic left sphenoid sinusitis. Electronically Signed   By: Jeannine Boga M.D.   On: 01/26/2020 21:47   CT Angio Neck W and/or Wo Contrast  Result Date: 01/26/2020 CLINICAL DATA:  Initial evaluation for acute intracranial hemorrhage. EXAM: CT ANGIOGRAPHY HEAD AND NECK CT VENOGRAM HEAD TECHNIQUE: Multidetector CT imaging of the head and neck was performed using the standard protocol during bolus administration of intravenous contrast. Multiplanar CT image reconstructions and MIPs were obtained to evaluate the vascular anatomy. Carotid stenosis measurements (when applicable) are obtained utilizing NASCET criteria, using the distal internal carotid diameter as the denominator. CONTRAST:  1mL OMNIPAQUE IOHEXOL 350 MG/ML SOLN COMPARISON:  Prior CT from earlier the same day. FINDINGS: CTA NECK FINDINGS Aortic arch: Visualized aortic arch of normal caliber with normal 3 vessel morphology. Mild atheromatous change within the arch itself. No hemodynamically significant stenosis seen about the origin of the great vessels. Right carotid system: Right common carotid  artery widely patent from its origin to the bifurcation. No significant atheromatous stenosis about the right bifurcation. Right ICA widely patent distally to the skull base without stenosis, dissection or occlusion. Left carotid system: Left common carotid artery widely patent from its origin to the bifurcation without stenosis. Minimal calcified plaque about the left bifurcation without stenosis. Left ICA widely patent distally to the skull base without stenosis, dissection or occlusion. Vertebral arteries: Both vertebral arteries arise from the subclavian arteries. No proximal subclavian artery stenosis. Left vertebral artery dominant. Vertebral arteries patent within the neck without stenosis, dissection or occlusion. Skeleton: No acute osseous abnormality. No discrete lytic or blastic osseous lesions. Prior ACDF at C3-C6 without adverse features. Other neck: No other acute soft tissue abnormality within the neck. Chronic left sphenoid sinusitis noted. No mass lesion or adenopathy. 8 mm right thyroid nodule noted, felt to be of doubtful significance given size and patient age. No follow-up imaging recommended regarding this lesion. Upper chest: Visualized upper chest demonstrates no acute finding. Review of the MIP images confirms the above findings CTA HEAD FINDINGS Anterior circulation: Petrous segments widely patent bilaterally. Mild atheromatous change seen within the cavernous/supraclinoid ICAs without stenosis or other abnormality. ICA termini well perfused. Right A1 widely patent. Left A1 hypoplastic but patent as well. Normal anterior communicating artery complex. Anterior cerebral arteries widely patent to their distal aspects without stenosis. No M1 stenosis or occlusion. Normal MCA bifurcations. Distal MCA branches well perfused and symmetric. Posterior circulation: Vertebral arteries widely patent to the vertebrobasilar junction without stenosis. Left vertebral artery dominant. Patent right PICA.  Left PICA not definitely seen. Basilar widely patent to its distal aspect without stenosis. Superior cerebral arteries patent bilaterally. Right PCA supplied via the basilar. Fetal type origin left PCA. Both PCAs well perfused to their distal aspects without stenosis. No intracranial aneurysm. No vascular abnormality seen underlying the left frontoparietal hemorrhage. Venous sinuses: Normal opacification seen throughout the superior sagittal sinus to the torcula. Transverse and sigmoid sinuses are patent as are the proximal internal jugular veins. Straight sinus, vein of Galen, internal cerebral veins, and basal veins of Rosenthal patent. No evidence for dural sinus thrombosis. Anatomic variants: Hypoplastic left A1 segment. Fetal type origin of the left PCA. Review of the MIP images confirms the above findings IMPRESSION: 1. Negative CTA of the head and neck. No large vessel occlusion, hemodynamically significant stenosis, or  other acute vascular abnormality. No vascular abnormality seen underlying the left cerebral hemorrhage. 2. Negative CT venogram.  No evidence for dural sinus thrombosis. 3. Chronic left sphenoid sinusitis. Electronically Signed   By: Jeannine Boga M.D.   On: 01/26/2020 21:47   MR BRAIN W WO CONTRAST  Result Date: 01/27/2020 CLINICAL DATA:  Stroke follow-up EXAM: MRI HEAD WITHOUT AND WITH CONTRAST TECHNIQUE: Multiplanar, multiecho pulse sequences of the brain and surrounding structures were obtained without and with intravenous contrast. CONTRAST:  51mL GADAVIST GADOBUTROL 1 MMOL/ML IV SOLN COMPARISON:  Head CT 01/26/2020 FINDINGS: Brain: Unchanged 3.6 cm intraparenchymal hematoma in the superior left hemisphere. There is moderate surrounding edema. No midline shift or other mass effect. There is no abnormal contrast enhancement. Multifocal white matter hyperintensity, most commonly due to chronic ischemic microangiopathy. There are small foci of acute ischemia within the right MCA/PCA  watershed zone. No chronic microhemorrhage. Normal midline structures. Vascular: Normal flow voids. Skull and upper cervical spine: Normal marrow signal. Sinuses/Orbits: Right mastoid fluid. Paranasal sinuses are clear. Normal orbits. Other: None IMPRESSION: 1. Unchanged 3.6 cm intraparenchymal hematoma in the superior left hemisphere with moderate surrounding edema. No midline shift. 2. Small foci of acute ischemia within the right MCA/PCA watershed zone. Electronically Signed   By: Ulyses Jarred M.D.   On: 01/27/2020 20:13   EEG adult  Result Date: 01/28/2020 Lora Havens, MD     01/28/2020  2:00 PM Patient Name: Kiosha Buchan MRN: [id number] Epilepsy Attending: Lora Havens Referring Physician/Provider: Burnetta Sabin, NP Date: 01/28/2020 Duration: 25.28 minutes Patient history: 74 year old female with left hemisphere IPH.  EEG 12 for seizures. Level of alertness: Awake, asleep AEDs during EEG study: None Technical aspects: This EEG study was done with scalp electrodes positioned according to the 10-20 International system of electrode placement. Electrical activity was acquired at a sampling rate of 500Hz  and reviewed with a high frequency filter of 70Hz  and a low frequency filter of 1Hz . EEG data were recorded continuously and digitally stored. Description: No clear posterior dominant rhythm was seen.  Sleep was characterized by vertex waves, sleep spindles (12 to 14 Hz), maximal frontocentral region.  EEG showed continuous generalized and lateralized left hemisphere 3 to 6 Hz theta-delta slowing.  Sharp waves were also seen in left frontal region. Hyperventilation and photic stimulation were not performed.   ABNORMALITY -Sharp wave, left frontal region -Continuous slow, generalized and lateralized left hemisphere IMPRESSION: This study showed evidence of potential epileptogenicity arising from left frontal region as well as cortical dysfunction in left hemisphere likely secondary to underlying hemorrhage.   Additionally there is evidence of moderate diffuse encephalopathy, nonspecific etiology.  No seizures were seen throughout the recording.  Lora Havens   ECHOCARDIOGRAM COMPLETE  Result Date: 01/28/2020    ECHOCARDIOGRAM REPORT   Patient Name:   Sophiana Orellana Date of Exam: 01/27/2020 Medical Rec #:  [id number]     Height:       60.0 in Accession #:    [id number]    Weight:       130.0 lb Date of Birth:  01-30-1946      BSA:          1.554 m Patient Age:    74 years      BP:           129/59 mmHg Patient Gender: F             HR:  93 bpm. Exam Location:  Inpatient Procedure: 2D Echo, Cardiac Doppler and Color Doppler Indications:    Stroke 434.9 / I163.9  History:        Patient has no prior history of Echocardiogram examinations.                 Risk Factors:Hypertension and Dyslipidemia. CKD. GERD.  Sonographer:    Jonelle Sidle Dance Referring Phys: [place]  1. Left ventricular ejection fraction, by estimation, is 65 to 70%. The left ventricle has normal function. The left ventricle has no regional wall motion abnormalities. Left ventricular diastolic parameters are indeterminate.  2. Right ventricular systolic function is normal. The right ventricular size is normal. Tricuspid regurgitation signal is inadequate for assessing PA pressure.  3. The mitral valve is normal in structure. No evidence of mitral valve regurgitation. No evidence of mitral stenosis.  4. The aortic valve is tricuspid. Aortic valve regurgitation is not visualized. No aortic stenosis is present.  5. The inferior vena cava is normal in size with greater than 50% respiratory variability, suggesting right atrial pressure of 3 mmHg.  6. Increased flow velocities across LVOT, AV, and PV maybe secondary to anemia, thyrotoxicosis, hyperdynamic or high flow state. Conclusion(s)/Recommendation(s): No intracardiac source of embolism detected on this transthoracic study. A transesophageal echocardiogram is recommended to  exclude cardiac source of embolism if clinically indicated. FINDINGS  Left Ventricle: Left ventricular ejection fraction, by estimation, is 65 to 70%. The left ventricle has normal function. The left ventricle has no regional wall motion abnormalities. The left ventricular internal cavity size was normal in size. There is  no left ventricular hypertrophy. Left ventricular diastolic parameters are indeterminate. Right Ventricle: The right ventricular size is normal. No increase in right ventricular wall thickness. Right ventricular systolic function is normal. Tricuspid regurgitation signal is inadequate for assessing PA pressure. Left Atrium: Left atrial size was normal in size. Right Atrium: Right atrial size was normal in size. Pericardium: A small pericardial effusion is present. The pericardial effusion is anterior to the right ventricle. Mitral Valve: The mitral valve is normal in structure. Normal mobility of the mitral valve leaflets. No evidence of mitral valve regurgitation. No evidence of mitral valve stenosis. Tricuspid Valve: The tricuspid valve is normal in structure. Tricuspid valve regurgitation is not demonstrated. No evidence of tricuspid stenosis. Aortic Valve: The aortic valve is tricuspid. Aortic valve regurgitation is not visualized. No aortic stenosis is present. Pulmonic Valve: The pulmonic valve was grossly normal. Pulmonic valve regurgitation is trivial. No evidence of pulmonic stenosis. Aorta: The aortic root is normal in size and structure. Venous: The inferior vena cava is normal in size with greater than 50% respiratory variability, suggesting right atrial pressure of 3 mmHg. IAS/Shunts: No atrial level shunt detected by color flow Doppler.  LEFT VENTRICLE PLAX 2D LVIDd:         3.45 cm  Diastology LVIDs:         2.10 cm  LV e' lateral: 5.59 cm/s LV PW:         0.90 cm  LV e' medial:  5.41 cm/s LV IVS:        1.20 cm LVOT diam:     1.90 cm LV SV:         94 LV SV Index:   60 LVOT Area:      2.84 cm  RIGHT VENTRICLE             IVC RV Basal diam:  2.30  cm     IVC diam: 1.70 cm RV S prime:     18.60 cm/s TAPSE (M-mode): 1.6 cm LEFT ATRIUM           Index       RIGHT ATRIUM           Index LA diam:      3.30 cm 2.12 cm/m  RA Area:     10.70 cm LA Vol (A2C): 32.5 ml 20.91 ml/m RA Volume:   24.00 ml  15.44 ml/m LA Vol (A4C): 25.5 ml 16.41 ml/m  AORTIC VALVE LVOT Vmax:   182.00 cm/s LVOT Vmean:  131.000 cm/s LVOT VTI:    0.331 m  AORTA Ao Root diam: 3.90 cm MV A velocity: 109.50 cm/s                             SHUNTS                             Systemic VTI:  0.33 m                             Systemic Diam: 1.90 cm Cherlynn Kaiser MD Electronically signed by Cherlynn Kaiser MD Signature Date/Time: 01/28/2020/1:13:37 AM    Final    CT VENOGRAM HEAD  Result Date: 01/26/2020 CLINICAL DATA:  Initial evaluation for acute intracranial hemorrhage. EXAM: CT ANGIOGRAPHY HEAD AND NECK CT VENOGRAM HEAD TECHNIQUE: Multidetector CT imaging of the head and neck was performed using the standard protocol during bolus administration of intravenous contrast. Multiplanar CT image reconstructions and MIPs were obtained to evaluate the vascular anatomy. Carotid stenosis measurements (when applicable) are obtained utilizing NASCET criteria, using the distal internal carotid diameter as the denominator. CONTRAST:  83mL OMNIPAQUE IOHEXOL 350 MG/ML SOLN COMPARISON:  Prior CT from earlier the same day. FINDINGS: CTA NECK FINDINGS Aortic arch: Visualized aortic arch of normal caliber with normal 3 vessel morphology. Mild atheromatous change within the arch itself. No hemodynamically significant stenosis seen about the origin of the great vessels. Right carotid system: Right common carotid artery widely patent from its origin to the bifurcation. No significant atheromatous stenosis about the right bifurcation. Right ICA widely patent distally to the skull base without stenosis, dissection or occlusion. Left carotid system:  Left common carotid artery widely patent from its origin to the bifurcation without stenosis. Minimal calcified plaque about the left bifurcation without stenosis. Left ICA widely patent distally to the skull base without stenosis, dissection or occlusion. Vertebral arteries: Both vertebral arteries arise from the subclavian arteries. No proximal subclavian artery stenosis. Left vertebral artery dominant. Vertebral arteries patent within the neck without stenosis, dissection or occlusion. Skeleton: No acute osseous abnormality. No discrete lytic or blastic osseous lesions. Prior ACDF at C3-C6 without adverse features. Other neck: No other acute soft tissue abnormality within the neck. Chronic left sphenoid sinusitis noted. No mass lesion or adenopathy. 8 mm right thyroid nodule noted, felt to be of doubtful significance given size and patient age. No follow-up imaging recommended regarding this lesion.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

## 2020-01-29 NOTE — Progress Notes (Signed)
  Speech Language Pathology Treatment: Dysphagia  Patient Details Name: Alison Cuevas MRN: [id number] DOB: 1946/05/21 Today's Date: 01/29/2020 Time: 5573-2202 SLP Time Calculation (min) (ACUTE ONLY): 14 min  Assessment / Plan / Recommendation Clinical Impression  Pt presents is lethargic again today, having received pain medication this morning per RN. Her daughter also shares that the pt is now c/o inability to see from her R eye (question decline in function vs increasing awareness - RN aware and already paged MD). SLP repositioned pt fully upright and provided Mod cues to maintain alertness and sustained attention as she drank approximately half a carton of milk with no overt s/s of aspiration. SLP also provided assist with self-feeding and regulation of pacing/bolus size. Discussed with pt/daughter as well as RN: pt remains at a higher risk for aspiration when she is so lethargic. Would offer POs only when fully alert and with careful use of aspiration precautions.    HPI HPI: Pt is a 74 yo female presenting with acute onset R-sided numbness/weakness. 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 MRI pending. PMH includes: chronic low back pain, DDD, HTN, HLD, anxiety, GERD      SLP Plan  Continue with current plan of care       Recommendations  Diet recommendations: Dysphagia 3 (mechanical soft);Thin liquid Liquids provided via: Straw;Cup Medication Administration: Whole meds with puree Supervision: Staff to assist with self feeding;Full supervision/cueing for compensatory strategies Compensations: Minimize environmental distractions;Slow rate;Small sips/bites Postural Changes and/or Swallow Maneuvers: Seated upright 90 degrees                Oral Care Recommendations: Oral care QID Follow up Recommendations: Inpatient Rehab SLP Visit  Diagnosis: Dysphagia, unspecified (R13.10) Plan: Continue with current plan of care       GO                Osie Bond., M.A. [place] [facility] Pager [phone] Office [phone]  01/29/2020, 10:15 AM

## 2020-01-29 NOTE — Plan of Care (Signed)
Pt alert and oriented. Purewick in place. Splint on R arm. Pt on RA.  Problem: Education: Goal: Knowledge of disease or condition will improve Outcome: Progressing Goal: Knowledge of secondary prevention will improve Outcome: Progressing Goal: Knowledge of patient specific risk factors addressed and post discharge goals established will improve Outcome: Progressing Goal: Individualized Educational Video(s) Outcome: Progressing

## 2020-01-29 NOTE — Progress Notes (Signed)
Occupational Therapy Treatment Patient Details Name: Alison Cuevas MRN: [id number] DOB: 07/09/1945 Today's Date: 01/29/2020    History of present illness Alison Cuevas is a 74 y.o. female past medical history of chronic low back pain, degenerative disc disease, hypertension, hyperlipidemia, anxiety, presented to [facility] emergency room as an acute code stroke for sudden onset of right-sided weakness.Stat head CT done revealed a intraparenchymal hematoma centered at the left frontoparietal convexity of 14 cc volume with associated subdural extension with a small subdural hemorrhage overlying the left cerebral convexity measuring up to 4 mm in thickness   OT comments  Returned to don splint to R elbow.  Patient supine in bed, anxious and tearful, requiring re-orientation to place and situation; she is reporting not knowing where she is and why, her daughter re-assures her therapy is here to help.  Patient able to follow cueing to wash her face with her L hand, but otherwise highly distracted internally.  Donned R elbow splint and repositioned patient for comfort, to midline (leaning L upon entry) and placing pillows under UEs.  Patient also requesting to do her cervical collar due to neck and L shoulder pain-- RN notified requesting pain meds.  Plan to have OT return to check splint 1 more time this afternoon.    Follow Up Recommendations  CIR;Supervision/Assistance - 24 hour    Equipment Recommendations  Other (comment) (TBD at next venue of care)    Recommendations for Other Services Rehab consult    Precautions / Restrictions Precautions Precautions: Fall Precaution Comments: Increased tone RUE (flexion)/RLE(extension) Required Braces or Orthoses: Cervical Brace Cervical Brace: Soft collar;For comfort       Mobility Bed Mobility Overal bed mobility: Needs Assistance Bed Mobility: Rolling;Sidelying to Sit;Sit to Sidelying Rolling: Max assist Sidelying to sit: Max assist;+2 for  physical assistance     Sit to sidelying: Total assist;+2 for safety/equipment;+2 for physical assistance General bed mobility comments: total assist to reposition to midline, slight L lateral lean with pillows under UEs for support   Transfers Overall transfer level: Needs assistance   Transfers: Sit to/from Stand;Stand Pivot Transfers Sit to Stand: Max assist;+2 physical assistance;+2 safety/equipment         General transfer comment: used Stedy to allow better control of rt foot and ankle    Balance Overall balance assessment: Needs assistance Sitting-balance support: Single extremity supported Sitting balance-Leahy Scale: Poor Sitting balance - Comments: Fair (5-10 seconds) to Poor (posterior lean requiring min-mod assist to correct ); had pt work on sittting balance at EOB (reaching with LUE reaching for footboard of bed and forward to arm of recliner, R forearm propping)   Standing balance support: Single extremity supported Standing balance-Leahy Scale: Poor Standing balance comment: mod assist using stedy                           ADL either performed or assessed with clinical judgement   ADL                                               Vision   Additional Comments: L gaze preference    Perception     Praxis      Cognition Arousal/Alertness: Lethargic Behavior During Therapy: Flat affect;Anxious;Agitated Overall Cognitive Status: Impaired/Different from baseline Area of Impairment: Orientation;Following commands;Problem solving;Awareness;Memory  Orientation Level: Disoriented to;Place;Time;Situation Current Attention Level: Sustained   Following Commands: Follows one step commands inconsistently;Follows one step commands with increased time Safety/Judgement: Decreased awareness of safety;Decreased awareness of deficits Awareness: Intellectual Problem Solving: Slow processing;Decreased  initiation;Difficulty sequencing;Requires verbal cues;Requires tactile cues General Comments: patient anxious and tearful upon entry, reporting "I just don't know what is going on" when re-oriented to place she reports "I don't know why I'm here"         Exercises     Shoulder Instructions       General Comments returned for splint check, donned soft elbow splint to R elbow with total assist; no discomfort noted; reviewed splint wear schedule with pt, daughter and RN; daughter reports plan to transition to CIR today     Pertinent Vitals/ Pain       Pain Assessment: Faces Faces Pain Scale: Hurts little more Pain Location: neck, L shoulder  Pain Descriptors / Indicators: Discomfort;Sore Pain Intervention(s): Limited activity within patient's tolerance;Monitored during session;Repositioned  Home Living                                          Prior Functioning/Environment              Frequency  Min 2X/week        Progress Toward Goals  OT Goals(current goals can now be found in the care plan section)  Progress towards OT goals: Progressing toward goals  Acute Rehab OT Goals Patient Stated Goal: to get better  Plan Discharge plan remains appropriate;Frequency remains appropriate    Co-evaluation                 AM-PAC OT "6 Clicks" Daily Activity     Outcome Measure   Help from another person eating meals?: A Lot Help from another person taking care of personal grooming?: A Lot Help from another person toileting, which includes using toliet, bedpan, or urinal?: Total Help from another person bathing (including washing, rinsing, drying)?: Total Help from another person to put on and taking off regular upper body clothing?: Total Help from another person to put on and taking off regular lower body clothing?: Total 6 Click Score: 8    End of Session    OT Visit Diagnosis: Unsteadiness on feet (R26.81);Other abnormalities of gait and  mobility (R26.89);Muscle weakness (generalized) (M62.81);Low vision, both eyes (H54.2);Other symptoms and signs involving cognitive function   Activity Tolerance Patient tolerated treatment well   Patient Left with call bell/phone within reach;in bed;with chair alarm set;with family/visitor present   Nurse Communication Other (comment) (splint schedule )        Time: 5809-9833 OT Time Calculation (min): 15 min  Charges: OT General Charges $OT Visit: 1 Visit OT Treatments $Self Care/Home Management : 8-22 mins  Jolaine Artist, OT Acute Rehabilitation Services Pager [phone] Office [place] 01/29/2020, 1:49 PM

## 2020-01-29 NOTE — Progress Notes (Signed)
Physical Therapy Treatment Patient Details Name: Alison Cuevas MRN: [id number] DOB: 06-29-1945 Today's Date: 01/29/2020    History of Present Illness Alison Cuevas is a 74 y.o. female past medical history of chronic low back pain, degenerative disc disease, hypertension, hyperlipidemia, anxiety, presented to [facility] emergency room as an acute code stroke for sudden onset of right-sided weakness.Stat head CT done revealed a intraparenchymal hematoma centered at the left frontoparietal convexity of 14 cc volume with associated subdural extension with a small subdural hemorrhage overlying the left cerebral convexity measuring up to 4 mm in thickness    PT Comments    Patient lethargic but arousable on arrival. She reports she didn't sleep well last night. Noted tightening heel cord RLE with plantarflexor tone AND to a bit lesser degree in LLE (daughter noted she has not seen pt's left foot pointing down as it was today). Daughter reports she discussed with RN (later confirmed, and RN reported MD has been notified). Utilized Stedy for sit to stand, however limited to 1 rep as pt began feeling weak and nauseated (?orthostasis). Stood from seat of Stedy with pt requiring more assist than initial sit to stand. Returned pt to supine with BP assessed 121/59 with pt reporting feeling better in supine. RN made aware.     Follow Up Recommendations  CIR;Supervision/Assistance - 24 hour;Supervision for mobility/OOB     Equipment Recommendations  Other (comment) (TBA)    Recommendations for Other Services       Precautions / Restrictions Precautions Precautions: Fall Precaution Comments: Increased tone RUE (flexion)/RLE(extension)    Mobility  Bed Mobility Overal bed mobility: Needs Assistance Bed Mobility: Rolling;Sidelying to Sit;Sit to Sidelying Rolling: Max assist Sidelying to sit: Max assist;+2 for physical assistance     Sit to sidelying: Total assist;+2 for safety/equipment;+2 for  physical assistance General bed mobility comments: roll to rt with max cues and physical assist (?pt more lethargic than previous visit); assists by reaching with LUE and moving LLE off EOB  Transfers Overall transfer level: Needs assistance   Transfers: Sit to/from Stand;Stand Pivot Transfers Sit to Stand: Max assist;+2 physical assistance;+2 safety/equipment         General transfer comment: used Stedy to allow better control of rt foot and ankle  Ambulation/Gait             General Gait Details: Unable   Stairs             Wheelchair Mobility    Modified Rankin (Stroke Patients Only) Modified Rankin (Stroke Patients Only) Pre-Morbid Rankin Score: No symptoms Modified Rankin: Severe disability     Balance Overall balance assessment: Needs assistance Sitting-balance support: Single extremity supported Sitting balance-Leahy Scale: Poor Sitting balance - Comments: Fair (5-10 seconds) to Poor (posterior lean requiring min-mod assist to correct ); had pt work on sittting balance at EOB (reaching with LUE reaching for footboard of bed and forward to arm of recliner, R forearm propping)   Standing balance support: Single extremity supported Standing balance-Leahy Scale: Poor Standing balance comment: mod assist using stedy                            Cognition Arousal/Alertness: Lethargic Behavior During Therapy: Flat affect Overall Cognitive Status: Impaired/Different from baseline Area of Impairment: Orientation;Following commands;Safety/judgement;Problem solving;Attention                 Orientation Level:  (not tested) Current Attention Level: Sustained   Following Commands: Follows  one step commands inconsistently Safety/Judgement: Decreased awareness of safety;Decreased awareness of deficits   Problem Solving: Slow processing;Decreased initiation;Difficulty sequencing;Requires verbal cues;Requires tactile cues General Comments: Right  inattention, requiring visual and verbal cues to orient to R. Crosses visual midline with repeated cuing. Increased time to follow verbal commands      Exercises      General Comments        Pertinent Vitals/Pain Pain Assessment: Faces Faces Pain Scale: Hurts little more Pain Location: neck Pain Descriptors / Indicators: Discomfort;Sore Pain Intervention(s): Limited activity within patient's tolerance;Monitored during session;Repositioned    Home Living                      Prior Function            PT Goals (current goals can now be found in the care plan section) Acute Rehab PT Goals Patient Stated Goal: to get better Time For Goal Achievement: 02/10/20 Potential to Achieve Goals: Fair Progress towards PT goals: Progressing toward goals    Frequency    Min 4X/week      PT Plan Current plan remains appropriate    Co-evaluation              AM-PAC PT "6 Clicks" Mobility   Outcome Measure  Help needed turning from your back to your side while in a flat bed without using bedrails?: A Lot Help needed moving from lying on your back to sitting on the side of a flat bed without using bedrails?: Total Help needed moving to and from a bed to a chair (including a wheelchair)?: Total Help needed standing up from a chair using your arms (e.g., wheelchair or bedside chair)?: A Lot Help needed to walk in hospital room?: Total Help needed climbing 3-5 steps with a railing? : Total 6 Click Score: 8    End of Session Equipment Utilized During Treatment: Gait belt Activity Tolerance: Patient limited by lethargy Patient left: with call bell/phone within reach;with family/visitor present;in bed;with bed alarm set Nurse Communication: Mobility status;Need for lift equipment PT Visit Diagnosis: Hemiplegia and hemiparesis;Unsteadiness on feet (R26.81);Difficulty in walking, not elsewhere classified (R26.2) Hemiplegia - Right/Left: Right Hemiplegia -  dominant/non-dominant: Non-dominant     Time: 1001-1031 PT Time Calculation (min) (ACUTE ONLY): 30 min  Charges:  $Neuromuscular Re-education: 23-37 mins                      Arby Barrette, PT Pager [phone]    Rexanne Mano 01/29/2020, 1:24 PM

## 2020-01-29 NOTE — Progress Notes (Signed)
Orthopedic Tech Progress Note Patient Details:  Alison Cuevas 25-Dec-1945 [id number]  Ortho Devices Type of Ortho Device: Prafo boot/shoe Ortho Device/Splint Location: BLE Ortho Device/Splint Interventions: Ordered, Application, Adjustment   Post Interventions Patient Tolerated: Well Instructions Provided: Care of device, Adjustment of device, Poper ambulation with device   Simcha Speir 01/29/2020, 11:33 AM

## 2020-01-29 NOTE — PMR Pre-admission (Addendum)
PMR Admission Coordinator Pre-Admission Assessment  Patient: Alison Cuevas is an 74 y.o., female MRN: [id number] DOB: 1945-12-17 Height: 5' (152.4 cm) Weight: 59 kg              Insurance Information HMO:    PPO:      PCP:      IPA:      80/20:      OTHER:  PRIMARY: Medicare A and B      Policy#: [id number]      Subscriber: patient CM Name:        Phone#:       Fax#:   Pre-Cert#:        Employer: Retired Benefits:  Phone #: [phone]     Name: Verified through [employer]. Date: 09/26/06     Deduct: $1484      Out of Pocket Max: none      Life Max: N/A  CIR: 100%      SNF: 100 days Outpatient: 80%     Co-Pay: 20% Home Health: 100%      Co-Pay: none DME: 80%     Co-Pay: 20% Providers: patient's choice  SECONDARY: [employer] NAP      Policy#: [id number]      Phone#: [phone]  Financial Counselor:        Phone#:    The "Data Collection Information Summary" for patients in Inpatient Rehabilitation Facilities with attached "Privacy Act [place] Records" was provided and verbally reviewed with: Patient and Family  Emergency Contact Information Contact Information     Name Relation Home Work Mobile   [place] Daughter [phone]     [place], Wheat   [phone]   Chaffin,Charles Spouse [phone]     Sehaj, Kolden   [phone]      Current Medical History  Patient Admitting Diagnosis: L ICH  History of Present Illness: a 74 y.o. female with history of HTN, anxiety d/o, DDD w/chronic pain who was admitted via [facility] with sudden onset of right-sided weakness with numbness that developed while she was working in her garden.  CT of head done showing IPH left frontoparietal convexity with subdural extension and 4 mm left-to-right midline shift.  She was transferred to [facility] for management and Cleviprex added for BP >140.  CTA head neck was negative for LVO or significant stenosis or AVM.  CT venogram was negative for dural sinus  thrombosis.  UDS negative for opiates and positive for benzos.  Dr. Leonie Man felt that bleed was hypertensive in nature--ASA discontinued.  Patient has had issues with right lower extremity pain and spasms due to increase in tone.   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 2D echo showed EF of 65 to 70% with normal LVEF and no wall abnormality.  Therapy evaluations completed yesterday revealing impairments due to right inattention, right visual deficits, delay in processing,  right hemiplegia with tone as well as pusher tendencies with posterior/right lateral lean affecting ADLs and mobility.  CIR recommended due to functional deficits.  Complete NIHSS TOTAL: 16 Glasgow Coma Scale Score: 14  Past Medical History  Past Medical History:  Diagnosis Date   Allergic rhinitis    maple pollen   Anxiety    Arthritis    Bowel obstruction (HCC)    Chronic low back pain    Dr. Trenton Gammon.  Has had back injection--bp elevated after.  Chronic pain syndrome    Chronic renal insufficiency, stage 2 (mild)    GFR 60-70   DDD (degenerative disc disease), cervical    Hx of ACDF (Dr. [name]).  Followed by Dr. [name].  Also, Dr. [name] do left C7-T1 intralaminal epidural injection.   Diverticulosis of colon    DJD (degenerative joint disease)    Gallstones    GERD (gastroesophageal reflux disease)    Herpes zoster 07/08/2014   Hypercholesteremia    mild; pt declined statin trial 05/2014--needs recheck lipid panel at first f/u visit in 2017   Hypertension    Osteopenia 06/2017   T score -1.4 FRAX 15% / 2%   Ovarian cyst 2017   82019-robotic assisted bilatel SPO: all path benign.   Perianal dermatitis    prn cutivate   Phlebitis    Right knee injury 2018   Patellofemoral crush injury--Dr. [name].   Trochanteric bursitis of right hip    Recurrent (injection by  Dr. 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 [name]). Recurrent fungal/bact OE.   Varicose vein    left leg     Family History  family history includes Breast cancer (age of onset: 5) in her sister; Diverticulitis in her mother; Hypertension in her mother; Leukemia in her brother; Lung disease in her brother; Lupus in her sister; Melanoma in her brother; Other in an other family member; Prostate cancer in her brother; Stomach cancer in her father.  Prior Rehab/Hospitalizations:  Has the patient had prior rehab or hospitalizations prior to admission? No  Has the patient had major surgery during 100 days prior to admission? No  Current Medications   Current Facility-Administered Medications:     stroke: mapping our early stages of recovery book, , Does not apply, Once, Donzetta Starch, NP   acetaminophen (TYLENOL) tablet 650 mg, 650 mg, Oral, Q4H PRN, 650 mg at 01/28/20 1207 **OR** acetaminophen (TYLENOL) 160 MG/5ML solution 650 mg, 650 mg, Per Tube, Q4H PRN **OR** acetaminophen (TYLENOL) suppository 650 mg, 650 mg, Rectal, Q4H PRN, Miachel Roux, Sharon L, NP   baclofen (LIORESAL) tablet 5 mg, 5 mg, Oral, BID, Biby, Sharon L, NP, 5 mg at 01/29/20 0915   cefTRIAXone (ROCEPHIN) 1 g in sodium chloride 0.9 % 100 mL IVPB, 1 g, Intravenous, Q24H, Biby, Sharon L, NP   chlorhexidine (PERIDEX) 0.12 % solution 15 mL, 15 mL, Mouth Rinse, BID, Biby, Sharon L, NP, 15 mL at 01/29/20 0914   Chlorhexidine Gluconate Cloth 2 % PADS 6 each, 6 each, Topical, Daily, Biby, Sharon L, NP, 6 each at 01/29/20 0915   fluticasone (FLONASE) 50 MCG/ACT nasal spray 2 spray, 2 spray, Each Nare, QHS, Biby, Sharon L, NP, 2 spray at 01/28/20 2312   HYDROcodone-acetaminophen (NORCO/VICODIN) 5-325 MG per tablet 1 tablet, 1 tablet, Oral, Q6H PRN, Donzetta Starch, NP, 1 tablet at 01/29/20 1343   labetalol (NORMODYNE) injection 5-20 mg, 5-20 mg,  Intravenous, Q10 min PRN, Burnetta Sabin L, NP, 20 mg at 01/27/20 1602   lisinopril (ZESTRIL) tablet 10 mg, 10 mg, Oral, Daily, Biby, Sharon L, NP, 10 mg at 01/29/20 0914   loratadine (CLARITIN) tablet 10 mg, 10 mg, Oral, QODAY, Biby, Sharon L, NP, 10 mg at 01/29/20 0914   ondansetron (ZOFRAN) injection 4 mg, 4 mg, Intravenous, Q6H PRN, Biby, Sharon L, NP   pantoprazole (PROTONIX) EC tablet 40 mg, 40 mg, Oral, QHS, Biby, Sharon L, NP, 40 mg  at 01/28/20 2257   senna-docusate (Senokot-S) tablet 1 tablet, 1 tablet, Oral, BID, Biby, Sharon L, NP, 1 tablet at 01/29/20 0914   verapamil (CALAN-SR) CR tablet 180 mg, 180 mg, Oral, Daily, Biby, Sharon L, NP, 180 mg at 01/29/20 0915  Patients Current Diet:  Diet Order             DIET DYS 3 Room service appropriate? Yes with Assist; Fluid consistency: Thin  Diet effective now                   Precautions / Restrictions Precautions Precautions: Fall Precaution Comments: Increased tone RUE (flexion)/RLE(extension) Cervical Brace: Soft collar, For comfort Restrictions Weight Bearing Restrictions: No   Has the patient had 2 or more falls or a fall with injury in the past year?No  Prior Activity Level Limited Community (1-2x/wk): Went to church on Thursday and Sunday.  Prior Functional Level Prior Function Level of Independence: Independent Comments: Out in the yard putting out feed for the deer when this occurred, does IADLs, drives.  Self Care: Did the patient need help bathing, dressing, using the toilet or eating?  Independent  Indoor Mobility: Did the patient need assistance with walking from room to room (with or without device)? Independent  Stairs: Did the patient need assistance with internal or external stairs (with or without device)? Independent  Functional Cognition: Did the patient need help planning regular tasks such as shopping or remembering to take medications? Independent  Development worker, international aid / Equipment Home  Equipment: Shower seat, Bedside commode, Environmental consultant - 2 wheels, Walker - 4 wheels, Justin - single point  Prior Device Use: Indicate devices/aids used by the patient prior to current illness, exacerbation or injury?  None  Current Functional Level Cognition  Arousal/Alertness:  (drowsy) Overall Cognitive Status: Impaired/Different from baseline Current Attention Level: Sustained Orientation Level: Oriented to person, Oriented to place, Disoriented to time, Disoriented to situation Following Commands: Follows one step commands inconsistently, Follows one step commands with increased time Safety/Judgement: Decreased awareness of safety, Decreased awareness of deficits General Comments: patient anxious and tearful upon entry, reporting "I just don't know what is going on" when re-oriented to place she reports "I don't know why I'm here"  Attention: Sustained Sustained Attention: Impaired Sustained Attention Impairment: Verbal basic Memory: Impaired Memory Impairment: Storage deficit, Retrieval deficit, Decreased recall of new information Awareness: Impaired Awareness Impairment: Intellectual impairment Problem Solving: Impaired Problem Solving Impairment: Verbal complex Safety/Judgment: Impaired    Extremity Assessment (includes Sensation/Coordination)  Upper Extremity Assessment: Defer to OT evaluation RUE Deficits / Details: Flexion tone, reflexive grip with palm touched RUE Coordination: decreased fine motor, decreased gross motor  Lower Extremity Assessment: RLE deficits/detail RLE Deficits / Details: Extensor tone, able to break with hip flexion and sitting EOB; sustained clonus. Limited DF AROM. RLE Sensation: decreased light touch RLE Coordination: decreased fine motor, decreased gross motor    ADLs  Overall ADL's : Needs assistance/impaired Eating/Feeding: Moderate assistance, Bed level Grooming: Wash/dry face, Bed level, Set up, Supervision/safety Upper Body Bathing: Maximal  assistance, Bed level Lower Body Bathing: Total assistance, Bed level Upper Body Dressing : Total assistance, Bed level Lower Body Dressing: Total assistance, Bed level Toilet Transfer: +2 for physical assistance, Stand-pivot, Total assistance Toilet Transfer Details (indicate cue type and reason): bed>recliner going to pt's right Toileting- Clothing Manipulation and Hygiene: Total assistance Toileting - Clothing Manipulation Details (indicate cue type and reason): Max A +2 sit<>stand at EOB    Mobility  Overal bed  mobility: Needs Assistance Bed Mobility: Rolling, Sidelying to Sit, Sit to Sidelying Rolling: Max assist Sidelying to sit: Max assist, +2 for physical assistance Supine to sit: Total assist, +2 for physical assistance Sit to sidelying: Total assist, +2 for safety/equipment, +2 for physical assistance General bed mobility comments: total assist to reposition to midline, slight L lateral lean with pillows under UEs for support     Transfers  Overall transfer level: Needs assistance Equipment used: 2 person hand held assist Transfer via Lift Equipment: Stedy Transfers: Sit to/from Stand, Risk manager Sit to Stand: Max assist, +2 physical assistance, +2 safety/equipment Stand pivot transfers: Total assist, +2 physical assistance General transfer comment: used Stedy to allow better control of rt foot and ankle    Ambulation / Gait / Stairs / Wheelchair Mobility  Ambulation/Gait General Gait Details: Unable    Posture / Balance Dynamic Sitting Balance Sitting balance - Comments: Fair (5-10 seconds) to Poor (posterior lean requiring min-mod assist to correct ); had pt work on sittting balance at EOB (reaching with LUE reaching for footboard of bed and forward to arm of recliner, R forearm propping) Balance Overall balance assessment: Needs assistance Sitting-balance support: Single extremity supported Sitting balance-Leahy Scale: Poor Sitting balance - Comments: Fair  (5-10 seconds) to Poor (posterior lean requiring min-mod assist to correct ); had pt work on sittting balance at EOB (reaching with LUE reaching for footboard of bed and forward to arm of recliner, R forearm propping) Standing balance support: Single extremity supported Standing balance-Leahy Scale: Poor Standing balance comment: mod assist using stedy    Special needs/care consideration Designated visitor daughter Judie Petit     Previous Home Environment (from acute therapy documentation) Living Arrangements: Spouse/significant other  Lives With: Spouse Available Help at Discharge: Family, Available 24 hours/day Type of Home: House Home Layout: One level Home Access: Stairs to enter Entrance Stairs-Rails: Right, Left, Can reach both Entrance Stairs-Number of Steps: 3 Additional Comments: husband is not in good health, pt needs to be able to take care of herself  Discharge Living Setting Plans for Discharge Living Setting: Patient's home, House, Lives with (comment) (Lives with husband) Type of Home at Discharge: House Discharge Home Layout: Two level, Full bath on main level, Able to live on main level with bedroom/bathroom Alternate Level Stairs-Number of Steps: Flight Discharge Home Access: Stairs to enter Entrance Stairs-Rails: Right, Left, Can reach both Entrance Stairs-Number of Steps: 3 at the front entry Discharge Bathroom Shower/Tub: Walk-in shower, Curtain Discharge Bathroom Toilet: Handicapped height Discharge Bathroom Accessibility: Yes How Accessible: Accessible via walker  Social/Family/Support Systems Patient Roles: Spouse, Parent (Has a spouse, 2 sons and 1 daughter.) Contact Information: Clinical biochemist Anticipated Caregiver: daughter Anticipated Caregiver's Contact Information: Judie Petit - daughter - [phone] Ability/Limitations of Caregiver: Daughter currently without work and can provide 24/7 care.  Dtr needs insurance so may need to go back to work by end of this  year. Caregiver Availability: 24/7 Discharge Plan Discussed with Primary Caregiver: Yes Is Caregiver In Agreement with Plan?: No Does Caregiver/Family have Issues with Lodging/Transportation while Pt is in Rehab?: No   Goals Patient/Family Goal for Rehab: PT/OT min assist, SLP mod I goals Expected length of stay: 28-32 days Cultural Considerations: Is [employer], wears no pants, no makeup and keeps her hair long. Pt/Family Agrees to Admission and willing to participate: Yes Program Orientation Provided & Reviewed with Pt/Caregiver Including Roles  & Responsibilities: Yes   Decrease burden of Care through IP rehab admission: N/A  Possible need for SNF placement upon discharge: Not anticipated   Patient Condition: This patient's condition remains as documented in the consult dated 01/28/20, in which the Rehabilitation Physician determined and documented that the patient's condition is appropriate for intensive rehabilitative care in an inpatient rehabilitation facility. Will admit to inpatient rehab today.  Preadmission Screen Completed By:  Retta Diones, RN, 01/29/2020 2:26 PM ______________________________________________________________________   Discussed status with Dr. Posey Pronto on 01/29/20 at 1245 and received approval for admission today.  Admission Coordinator:  Retta Diones, time1425/Date8/4/21

## 2020-01-29 NOTE — Progress Notes (Addendum)
Inpatient Rehabilitation Medication Review by a Pharmacist  A complete drug regimen review was completed for this patient to identify any potential clinically significant medication issues.  Clinically significant medication issues were identified: Yes   Type of Medication Issue Identified Description of Issue Urgent (address now) Non-Urgent (address on AM team rounds) Plan Plan Accepted by Provider? (Yes / No / Pending AM Rounds)  Drug Interaction(s) (clinically significant)       Duplicate Therapy       Allergy       No Medication Administration End Date       Incorrect Dose       Additional Drug Therapy Needed       Other  Neurology wrote in discharge summary to start baclofen 5 mg BID (not ordered at CIR) Pt reported taking the following meds on admission to [facility] (listed on discharge med list; not ordered at [facility]): alprazolam PRN, calcium carbonate, famotidine PRN, simethicone PRN, lubricating eye drops, meclizine PRN, multivitamin, potassium, vitamin B12, vitamin D3 Non-urgent CIR team to address on rounds Pending AM Rounds    Name of provider notified for urgent issues identified: N/A  For non-urgent medication issues to be resolved on team rounds tomorrow morning a [facility] Secure Chat Handoff was sent to: N/A (pt admitted after 5:30 PM)  Time spent performing this drug regimen review (minutes):  [place], PharmD, BCPS, [facility] Clinical Pharmacist 01/29/2020 8:33 PM

## 2020-01-29 NOTE — TOC Transition Note (Signed)
Transition of Care [facility]) - CM/SW Discharge Note   Patient Details  Name: Alison Cuevas MRN: [id number] Date of Birth: 1945/10/23  [facility] CM/SW Contact:  Pollie Friar, RN Phone Number: 01/29/2020, 2:53 PM   Clinical Narrative:    Pt is discharging to CIR today. CM signing off.    Final next level of care: IP Rehab Facility Barriers to Discharge: No Barriers Identified   Patient Goals and CMS Choice        Discharge Placement                       Discharge Plan and Services                                     Social Determinants of Health (SDOH) Interventions     Readmission Risk Interventions No flowsheet data found.

## 2020-01-29 NOTE — Progress Notes (Signed)
OT progress Note  Pt seen for splint check this PM, checked x3 hours after donning R soft elbow splint as pt anticipating d/c to CIR prior to full 4hr time frame complete. Pt tolerating splint wear well, no discomfort noted and pt without redness or adverse reactions. Improved R elbow extension noted after splint wear and pt tolerating gentle PROM to RUE during session. Pt to d/c to CIR this afternoon.      01/29/20 1720  OT Visit Information  Last OT Received On 01/29/20  Assistance Needed +2  History of Present Illness Alison Cuevas is a 74 y.o. female past medical history of chronic low back pain, degenerative disc disease, hypertension, hyperlipidemia, anxiety, presented to [facility] emergency room as an acute code stroke for sudden onset of right-sided weakness.Stat head CT done revealed a intraparenchymal hematoma centered at the left frontoparietal convexity of 14 cc volume with associated subdural extension with a small subdural hemorrhage overlying the left cerebral convexity measuring up to 4 mm in thickness  Precautions  Precautions Fall  Precaution Comments Increased tone RUE (flexion)/RLE(extension)  Required Braces or Orthoses Cervical Brace  Cervical Brace Soft collar;For comfort  Pain Assessment  Pain Assessment Faces  Faces Pain Scale 0  Pain Intervention(s) Monitored during session  Cognition  Arousal/Alertness Lethargic  Behavior During Therapy Flat affect;Anxious;Agitated  Overall Cognitive Status Impaired/Different from baseline  Area of Impairment Orientation;Following commands;Problem solving;Awareness;Memory  Orientation Level Disoriented to;Place;Time;Situation  Following Commands Follows one step commands inconsistently;Follows one step commands with increased time  Awareness Intellectual  Problem Solving Slow processing;Decreased initiation;Difficulty sequencing;Requires verbal cues;Requires tactile cues  ADL  Overall ADL's  Needs assistance/impaired   Grooming Moderate assistance;Bed level;Brushing hair  Grooming Details (indicate cue type and reason) assist to support L elbow, cues for thoroughness   General ADL Comments splint checked performed for R soft elbow splint. no signs of redness or adverse reaction noted, improved ease of R elbow extension noted   Vision- Assessment  Additional Comments max cues to attempt having pt track towards R side, pt able to use L hand to grasp R hand at midline   Exercises  Exercises Other exercises  Other Exercises  Other Exercises gentle PROM to R elbow including passive stretching into elbow extension, performed among multiple planes, pt tolerating well   Other Exercises holding item at midline using LUE to grasp both item and R hand   OT - End of Session  Activity Tolerance Patient tolerated treatment well  Patient left with call bell/phone within reach;in bed;with family/visitor present;with nursing/sitter in room  Nurse Communication Other (comment) (splint schedule )  OT Assessment/Plan  OT Plan Discharge plan remains appropriate;Frequency remains appropriate  OT Visit Diagnosis Unsteadiness on feet (R26.81);Other abnormalities of gait and mobility (R26.89);Muscle weakness (generalized) (M62.81);Low vision, both eyes (H54.2);Other symptoms and signs involving cognitive function  OT Frequency (ACUTE ONLY) Min 2X/week  Recommendations for Other Services Rehab consult  Follow Up Recommendations CIR;Supervision/Assistance - 24 hour  OT Equipment Other (comment) (TBD at next venue of care)  AM-PAC OT "6 Clicks" Daily Activity Outcome Measure (Version 2)  Help from another person eating meals? 2  Help from another person taking care of personal grooming? 2  Help from another person toileting, which includes using toliet, bedpan, or urinal? 1  Help from another person bathing (including washing, rinsing, drying)? 1  Help from another person to put on and taking off regular upper body clothing? 1   Help from another person to put on and  taking off regular lower body clothing? 1  6 Click Score 8  OT Goal Progression  Progress towards OT goals Progressing toward goals  Acute Rehab OT Goals  Patient Stated Goal to get better  OT Goal Formulation With patient  Time For Goal Achievement 02/10/20  Potential to Achieve Goals Good  OT Time Calculation  OT Start Time (ACUTE ONLY) 1600  OT Stop Time (ACUTE ONLY) 1613  OT Time Calculation (min) 13 min  OT General Charges  $OT Visit 1 Visit  OT Treatments  $Orthotics/Prosthetics Check 8-22 mins    Lou Cal, OT Acute Rehabilitation Services Pager [phone] Office [phone]

## 2020-01-29 NOTE — Progress Notes (Addendum)
OT Note   RN reports splint currently on patient and unable to report when it was removed.  Therapist removed splint to R elbow and checked skin, no irritation or redness noted but position of splint was below elbow on forearm. Plan to keep splint off for a few hours before re-donning this afternoon. Daughter present and agreeable with plan. Will follow acutely.     Jolaine Artist, OT Acute Rehabilitation Services Pager [phone] Office [phone]

## 2020-01-29 NOTE — H&P (Signed)
Physical Medicine and Rehabilitation Admission H&P    Chief Complaint  Patient presents with  . Stroke with functional deficits.   HPI: Alison Cuevas is a 74 year old [place] female with history of HTN, anxiety d/o, DDD with chronic pain who was [facility] via [facility] with sudden onset of right hemiparesis and numbness on 01/26/20 that developed while she was working in her garden.  History taken from daughter and chart review due to cognition. CT of head done showing IPH left frontoparietal convexity with subdural extension and 4 mm left-to-right midline shift.  She was transferred to [facility] for management and Cleviprex added for BP greater than 140.  CTA head was negative for LVO or significant stenosis or AVM.  CT venogram was negative for dural sinus thrombosis.  UDS negative for opiates but positive for benzodiazapines.  She also had issues with delirium even past admission requiring IV Haldol.    Dr. Leonie Man felt that bleed was hypertensive in nature and aspirin was discontinued.  Follow-up MRI brain personally reviewed, showing stable large left IPH.  Per report, unchanged 3.6 cm IPH in superior left hemisphere with moderate edema and small foci of acute ischemia within right MCA/PCA watershed zone.  Echocardiogram with EF of 65-70%. Normal LVH and no wall abnormality.  Patient has had issues with right extremity pain as well as spasms due to increase in tone.  Baclofen was added yesterday to help with spasticity.  Family also expresses some concerns of mild lethargy yesterday therefore urine culture was ordered and positive for Klebsiella.  Therefore started on ceftriaxone today.  She is noted to be more lethargic with family reporting agitation as well as confusion today.  Therapy evaluations revealed right visual field deficits with right inattention, delay in processing, right hemiplegia with tone as well as pusher tendency affecting ADLs and mobility.  CIR was recommended due to  functional decline. Please see preadmission assessment from earlier today as well.   Review of Systems  Unable to perform ROS: Mental acuity   Past Medical History:  Diagnosis Date  . Allergic rhinitis    maple pollen  . Anxiety   . Arthritis   . Bowel obstruction ([place])   . Chronic low back pain    Dr. Trenton Gammon.  Has had back injection--bp elevated after.  . Chronic pain syndrome   . Chronic renal insufficiency, stage 2 (mild)    GFR 60-70  . DDD (degenerative disc disease), cervical    Hx of ACDF (Dr. [name]).  Followed by Dr. [name].  Also, Dr. [name] do left C7-T1 intralaminal epidural injection.  . Diverticulosis of colon   . DJD (degenerative joint disease)   . Gallstones   . GERD (gastroesophageal reflux disease)   . Herpes zoster 07/08/2014  . Hypercholesteremia    mild; pt declined statin trial 05/2014--needs recheck lipid panel at first f/u visit in 2017  . Hypertension   . Osteopenia 06/2017   T score -1.4 FRAX 15% / 2%  . Ovarian cyst 2017   [id number] assisted bilatel SPO: all path benign.  . Perianal dermatitis    prn cutivate  . Phlebitis   . Right knee injury 2018   Patellofemoral crush injury--Dr. [name].  . Trochanteric bursitis of right hip    Recurrent (injection by Dr. [name] 05/26/15)  . Tympanic membrane rupture, right 12/01/2016   As of 08/2018 pt set for tympanomastoidectomy and STSG (Dr. [name]). Recurrent fungal/bact OE.  . Varicose vein  left leg     Past Surgical History:  Procedure Laterality Date  . ABDOMINAL HYSTERECTOMY  03/2015   TAH.  Last pap 2016.  No hx of abnl paps.  Per GYN, no further pap smears indicated.  . ANTERIOR AND POSTERIOR REPAIR N/A 03/18/2014   Procedure: Cystocele repair with graft, Vault suspension, Rectocele repair;  Surgeon: [name], MD;  Location: [facility];  Service: Urology;  Laterality: N/A;  . CHOLECYSTECTOMY    . CHOLECYSTECTOMY, LAPAROSCOPIC    . COLON SURGERY    .  COLONOSCOPY  04/2006; 05/26/16   2007 (Dr. [name]): Normal.  04/2016 (Dr. [name]) normal except diverticulosis and decreased anal sphincter tone.  No repeat colonoscopy is recommended due to age.  . cspine surgery     Dr. Wiliam Ke level ant cerv discectomy /fusion w/plating  . CYSTO N/A 03/18/2014   Procedure: CYSTO;  Surgeon: [name], MD;  Location: [facility];  Service: Urology;  Laterality: N/A;  . DEXA  07/30/2015; 06/2018   2017 and 2020 -->Osteopenia--repeat 2 yrs.  Marland Kitchen LYSIS OF ADHESION N/A 01/31/2018   Procedure: POSSIBLE LYSIS OF ADHESION;  Surgeon: Everitt Amber, MD;  Location: [facility];  Service: Gynecology;  Laterality: N/A;  . [place]     with prolasped bledder repair  . RESECTION OF COLON     BENIGN TUMOR  . RIGHT EAR SURGERY  08/23/2017   Dr. 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 right ear surgery  12/12/2018   Right tympanomastoidectomy, ossiculoplasty with partial prosthesis, split thickness skin graft from postauricular skin 1x1cm, and right tragal cartilage graft 1x1 cm [facility])  . right hemicolectomy for diverticulitis with abscess  1993  . ROBOTIC ASSISTED BILATERAL SALPINGO OOPHERECTOMY Bilateral 01/31/2018   All PATH benign.  Procedure: XI ROBOTIC ASSISTED BILATERAL SALPINGO OOPHORECTOMY;  Surgeon: Everitt Amber, MD;  Location: [facility];  Service: Gynecology;  Laterality: Bilateral;  . TUBAL LIGATION      Family History  Problem Relation Age of Onset  . Hypertension Mother   . Diverticulitis Mother   . Breast cancer Sister 90  . Melanoma Brother   . Prostate cancer Brother   . Leukemia Brother   . Lupus Sister   . Lung disease Brother   . Stomach cancer Father   . Other Other        Family member with MGUS    Social History:  reports that she has never smoked. She has never used smokeless tobacco. She reports that  she does not drink alcohol and does not use drugs.    Allergies  Allergen Reactions  . Gabapentin Other (See Comments)    Elevated heart rate and crazy dreams  . Prednisone Other (See Comments)    REACTION: funny feeling    Medications Prior to Admission  Medication Sig Dispense Refill  . ALPRAZolam (XANAX) 0.5 MG tablet TAKE 1 TABLET BY MOUTH THREE TIMES A DAY AS NEEDED FOR ANXIETY (Patient taking differently: Take 0.5 mg by mouth as needed for anxiety. ) 90 tablet 5  . aspirin (ASPIRIN 81) 81 MG EC tablet Take 81 mg by mouth daily.     . Biotin 10000 MCG TABS Take 10,000 mcg by mouth daily.     . calcium carbonate (OSCAL) 1500 (600 Ca) MG TABS tablet Take 600 mg of elemental calcium by mouth daily with breakfast.    .  Carboxymethylcellul-Glycerin (LUBRICATING EYE DROPS OP) Place 1 drop into both eyes daily as needed (dry eyes).    . Cholecalciferol (VITAMIN D3) 5000 units CAPS Take 5,000 Units by mouth daily.    . famotidine (PEPCID) 40 MG tablet TAKE 1 TABLET BY MOUTH TWICE A DAY (Patient taking differently: Take 40 mg by mouth as needed for heartburn or indigestion. ) 180 tablet 1  . fluticasone (FLONASE) 50 MCG/ACT nasal spray Place 2 sprays into both nostrils at bedtime. 48 g 3  . Lactobacillus-Inulin (CULTURELLE DIGESTIVE HEALTH PO) Take 1 tablet by mouth daily as needed (upset stomach).     . lisinopril (ZESTRIL) 10 MG tablet TAKE 1 TABLET BY MOUTH EVERY DAY (Patient taking differently: Take 10 mg by mouth daily. ) 90 tablet 1  . loratadine (CLARITIN) 10 MG tablet Take 10 mg by mouth every other day.     . meclizine (ANTIVERT) 12.5 MG tablet 1-2 tabs po tid prn (Patient taking differently: Take 12.5 mg by mouth as needed for dizziness. ) 60 tablet 1  . meloxicam (MOBIC) 15 MG tablet TAKE 1 TABLET BY MOUTH EVERY DAY WITH FOOD AS NEEDED FOR MUSCULOSKELETAL PAIN (Patient taking differently: Take 15 mg by mouth as needed for pain (musculoskeletal pain). ) 30 tablet 6  . Multiple  Minerals-Vitamins (CALCIUM-MAGNESIUM-ZINC-D3 PO) Take 1 tablet by mouth daily.     . Multiple Vitamins-Minerals (MULTIVITAMIN ADULT) TABS Take by mouth daily.    . ondansetron (ZOFRAN) 4 MG tablet Take 1 tablet (4 mg total) by mouth every 8 (eight) hours as needed for nausea or vomiting. (Patient taking differently: Take 4 mg by mouth as needed for nausea or vomiting. ) 30 tablet 1  . POTASSIUM PO Take 1 tablet by mouth daily.     Marland Kitchen PREVIDENT 5000 BOOSTER PLUS 1.1 % PSTE Place 1 strip onto teeth 3 (three) times daily. After use, spit out but do not rinse.  6  . Simethicone (GAS-X PO) Take 1 tablet by mouth daily as needed (gas).    . verapamil (CALAN-SR) 180 MG CR tablet 1 tab po qd (Patient taking differently: Take 180 mg by mouth daily. ) 90 tablet 3  . vitamin B-12 (CYANOCOBALAMIN) 1000 MCG tablet Take 2,000 mcg by mouth daily.    . fluticasone (CUTIVATE) 0.05 % cream Apply to affected area bid prn (Patient not taking: Reported on 09/25/2019) 30 g 1  . HYDROcodone-acetaminophen (NORCO/VICODIN) 5-325 MG tablet 1 tab po tid prn pain (Patient not taking: Reported on 01/27/2020) 90 tablet 0  . triamcinolone ointment (KENALOG) 0.1 % Apply 1 application topically 2 (two) times daily. (Patient not taking: Reported on 01/01/2020) 30 g 1    Drug Regimen Review  Drug regimen was reviewed and remains appropriate with no significant issues identified  Home: Home Living Family/patient expects to be discharged to:: Inpatient rehab Living Arrangements: Spouse/significant other Available Help at Discharge: Family, Available 24 hours/day Type of Home: House Home Access: Stairs to enter [employer] of Steps: 3 Entrance Stairs-Rails: Right, Left, Can reach both Home Layout: One level Home Equipment: Shower seat, Bedside commode, Environmental consultant - 2 wheels, Walker - 4 wheels, [place] - single point Additional Comments: husband is not in good health, pt needs to be able to take care of herself  Lives With:  Spouse   Functional History: Prior Function Level of Independence: Independent Comments: Out in the yard putting out feed for the deer when this occurred, does IADLs, drives.  Functional Status:  Mobility: Bed Mobility Overal  bed mobility: Needs Assistance Bed Mobility: Rolling, Sidelying to Sit, Sit to Sidelying Rolling: Max assist Sidelying to sit: Max assist, +2 for physical assistance Supine to sit: Total assist, +2 for physical assistance Sit to sidelying: Total assist, +2 for safety/equipment, +2 for physical assistance General bed mobility comments: total assist to reposition to midline, slight L lateral lean with pillows under UEs for support  Transfers Overall transfer level: Needs assistance Equipment used: 2 person hand held assist Transfer via Lift Equipment: Stedy Transfers: Sit to/from Stand, Risk manager Sit to Stand: Max assist, +2 physical assistance, +2 safety/equipment Stand pivot transfers: Total assist, +2 physical assistance General transfer comment: used Stedy to allow better control of rt foot and ankle Ambulation/Gait General Gait Details: Unable    ADL: ADL Overall ADL's : Needs assistance/impaired Eating/Feeding: Moderate assistance, Bed level Grooming: Wash/dry face, Bed level, Set up, Supervision/safety Upper Body Bathing: Maximal assistance, Bed level Lower Body Bathing: Total assistance, Bed level Upper Body Dressing : Total assistance, Bed level Lower Body Dressing: Total assistance, Bed level Toilet Transfer: +2 for physical assistance, Stand-pivot, Total assistance Toilet Transfer Details (indicate cue type and reason): bed>recliner going to pt's right Toileting- Clothing Manipulation and Hygiene: Total assistance Toileting - Clothing Manipulation Details (indicate cue type and reason): Max A +2 sit<>stand at EOB  Cognition: Cognition Overall Cognitive Status: Impaired/Different from baseline Arousal/Alertness:   (drowsy) Orientation Level: Oriented to person, Oriented to place, Disoriented to time, Disoriented to situation Attention: Sustained Sustained Attention: Impaired Sustained Attention Impairment: Verbal basic Memory: Impaired Memory Impairment: Storage deficit, Retrieval deficit, Decreased recall of new information Awareness: Impaired Awareness Impairment: Intellectual impairment Problem Solving: Impaired Problem Solving Impairment: Verbal complex Safety/Judgment: Impaired Cognition Arousal/Alertness: Lethargic Behavior During Therapy: Flat affect, Anxious, Agitated Overall Cognitive Status: Impaired/Different from baseline Area of Impairment: Orientation, Following commands, Problem solving, Awareness, Memory Orientation Level: Disoriented to, Place, Time, Situation Current Attention Level: Sustained Following Commands: Follows one step commands inconsistently, Follows one step commands with increased time Safety/Judgement: Decreased awareness of safety, Decreased awareness of deficits Awareness: Intellectual Problem Solving: Slow processing, Decreased initiation, Difficulty sequencing, Requires verbal cues, Requires tactile cues General Comments: patient anxious and tearful upon entry, reporting "I just don't know what is going on" when re-oriented to place she reports "I don't know why I'm here"    Blood pressure (!) 142/70, pulse 87, temperature 98.6 F (37 C), temperature source Oral, resp. rate 16, height 5' (1.524 m), weight 59 kg, last menstrual period 06/27/1972, SpO2 96 %. Physical Exam Vitals and nursing note reviewed.  Constitutional:      General: She is not in acute distress.    Comments: Frail.  HENT:     Head: Normocephalic and atraumatic.     Right Ear: External ear normal.     Left Ear: External ear normal.     Nose: Nose normal.  Eyes:     General: No scleral icterus.       Right eye: No discharge.        Left eye: No discharge.     Comments: Limited EOM   Cardiovascular:     Rate and Rhythm: Normal rate and regular rhythm.  Pulmonary:     Effort: Pulmonary effort is normal. No respiratory distress.     Breath sounds: No stridor.  Abdominal:     General: Abdomen is flat. Bowel sounds are normal. There is no distension.  Musculoskeletal:     Cervical back: Normal range of motion and neck supple.  Comments: No edema or tenderness in extremities  Skin:    General: Skin is warm and dry.  Neurological:     Mental Status: She is alert.     Comments: Alert and oriented x1 Right facial weakness Extensor tone RLE.  Motor: RUE/RLE: 0/5 proximal distal Right neglect LUE: 4-/5 proximal distal RLE: 2/5 proximal distal midsize inhibition)  Psychiatric:     Comments: Limited due to mentation, disengaged, limited eye contact    Results for orders placed or performed during the hospital encounter of 01/26/20 (from the past 48 hour(s))  Lipid panel     Status: None   Collection Time: 01/28/20  3:41 AM  Result Value Ref Range   Cholesterol 163 0 - 200 mg/dL   Triglycerides 33 <150 mg/dL   HDL 75 >40 mg/dL   Total CHOL/HDL Ratio 2.2 RATIO   VLDL 7 0 - 40 mg/dL   LDL Cholesterol 81 0 - 99 mg/dL    Comment:        Total Cholesterol/HDL:CHD Risk Coronary Heart Disease Risk Table                     Men   Women  1/2 Average Risk   3.4   3.3  Average Risk       5.0   4.4  2 X Average Risk   9.6   7.1  3 X Average Risk  23.4   11.0        Use the calculated Patient Ratio above and the CHD Risk Table to determine the patient's CHD Risk.        ATP III CLASSIFICATION (LDL):  <100     mg/dL   Optimal  100-129  mg/dL   Near or Above                    Optimal  130-159  mg/dL   Borderline  160-189  mg/dL   High  >190     mg/dL   Very High Performed at [facility] [address] [place], [place] [address]   Hemoglobin A1c     Status: None   Collection Time: 01/28/20  3:41 AM  Result Value Ref Range   Hgb A1c MFr Bld 5.0 4.8 -  5.6 %    Comment: (NOTE) Pre diabetes:          5.7%-6.4%  Diabetes:              >6.4%  Glycemic control for   <7.0% adults with diabetes    Mean Plasma Glucose 96.8 mg/dL    Comment: Performed at [facility] [address], [place], [place] [address]   MR BRAIN W WO CONTRAST  Result Date: 01/27/2020 CLINICAL DATA:  Stroke follow-up EXAM: MRI HEAD WITHOUT AND WITH CONTRAST TECHNIQUE: Multiplanar, multiecho pulse sequences of the brain and surrounding structures were obtained without and with intravenous contrast. CONTRAST:  13mL GADAVIST GADOBUTROL 1 MMOL/ML IV SOLN COMPARISON:  Head CT 01/26/2020 FINDINGS: Brain: Unchanged 3.6 cm intraparenchymal hematoma in the superior left hemisphere. There is moderate surrounding edema. No midline shift or other mass effect. There is no abnormal contrast enhancement. Multifocal white matter hyperintensity, most commonly due to chronic ischemic microangiopathy. There are small foci of acute ischemia within the right MCA/PCA watershed zone. No chronic microhemorrhage. Normal midline structures. Vascular: Normal flow voids. Skull and upper cervical spine: Normal marrow signal. Sinuses/Orbits: Right mastoid fluid. Paranasal sinuses are clear.  Normal orbits. Other: None IMPRESSION: 1. Unchanged 3.6 cm intraparenchymal hematoma in the superior left hemisphere with moderate surrounding edema. No midline shift. 2. Small foci of acute ischemia within the right MCA/PCA watershed zone. Electronically Signed   By: Ulyses Jarred M.D.   On: 01/27/2020 20:13   EEG adult  Result Date: 01/28/2020 Lora Havens, MD     01/28/2020  2:00 PM Patient Name: Ilyse Tremain MRN: [id number] Epilepsy Attending: Lora Havens Referring Physician/Provider: Burnetta Sabin, NP Date: 01/28/2020 Duration: 25.28 minutes Patient history: 74 year old female with left hemisphere IPH.  EEG 12 for seizures. Level of alertness: Awake, asleep AEDs during EEG study: None Technical aspects: This  EEG study was done with scalp electrodes positioned according to the 10-20 International system of electrode placement. Electrical activity was acquired at a sampling rate of 500Hz  and reviewed with a high frequency filter of 70Hz  and a low frequency filter of 1Hz . EEG data were recorded continuously and digitally stored. Description: No clear posterior dominant rhythm was seen.  Sleep was characterized by vertex waves, sleep spindles (12 to 14 Hz), maximal frontocentral region.  EEG showed continuous generalized and lateralized left hemisphere 3 to 6 Hz theta-delta slowing.  Sharp waves were also seen in left frontal region. Hyperventilation and photic stimulation were not performed.   ABNORMALITY -Sharp wave, left frontal region -Continuous slow, generalized and lateralized left hemisphere IMPRESSION: This study showed evidence of potential epileptogenicity arising from left frontal region as well as cortical dysfunction in left hemisphere likely secondary to underlying hemorrhage.  Additionally there is evidence of moderate diffuse encephalopathy, nonspecific etiology.  No seizures were seen throughout the recording.  Lora Havens   ECHOCARDIOGRAM COMPLETE  Result Date: 01/28/2020    ECHOCARDIOGRAM REPORT   Patient Name:   Shandrell Soileau Date of Exam: 01/27/2020 Medical Rec #:  [id number]     Height:       60.0 in Accession #:    [id number]    Weight:       130.0 lb Date of Birth:  Aug 03, 1945      BSA:          1.554 m Patient Age:    16 years      BP:           129/59 mmHg Patient Gender: F             HR:           93 bpm. Exam Location:  Inpatient Procedure: 2D Echo, Cardiac Doppler and Color Doppler Indications:    Stroke 434.9 / I163.9  History:        Patient has no prior history of Echocardiogram examinations.                 Risk Factors:Hypertension and Dyslipidemia. CKD. GERD.  Sonographer:    Jonelle Sidle Dance Referring Phys: [place]  1. Left ventricular ejection fraction, by  estimation, is 65 to 70%. The left ventricle has normal function. The left ventricle has no regional wall motion abnormalities. Left ventricular diastolic parameters are indeterminate.  2. Right ventricular systolic function is normal. The right ventricular size is normal. Tricuspid regurgitation signal is inadequate for assessing PA pressure.  3. The mitral valve is normal in structure. No evidence of mitral valve regurgitation. No evidence of mitral stenosis.  4. The aortic valve is tricuspid. Aortic valve regurgitation is not visualized. No aortic stenosis is present.  5. The inferior vena cava is normal  in size with greater than 50% respiratory variability, suggesting right atrial pressure of 3 mmHg.  6. Increased flow velocities across LVOT, AV, and PV maybe secondary to anemia, thyrotoxicosis, hyperdynamic or high flow state. Conclusion(s)/Recommendation(s): No intracardiac source of embolism detected on this transthoracic study. A transesophageal echocardiogram is recommended to exclude cardiac source of embolism if clinically indicated. FINDINGS  Left Ventricle: Left ventricular ejection fraction, by estimation, is 65 to 70%. The left ventricle has normal function. The left ventricle has no regional wall motion abnormalities. The left ventricular internal cavity size was normal in size. There is  no left ventricular hypertrophy. Left ventricular diastolic parameters are indeterminate. Right Ventricle: The right ventricular size is normal. No increase in right ventricular wall thickness. Right ventricular systolic function is normal. Tricuspid regurgitation signal is inadequate for assessing PA pressure. Left Atrium: Left atrial size was normal in size. Right Atrium: Right atrial size was normal in size. Pericardium: A small pericardial effusion is present. The pericardial effusion is anterior to the right ventricle. Mitral Valve: The mitral valve is normal in structure. Normal mobility of the mitral valve  leaflets. No evidence of mitral valve regurgitation. No evidence of mitral valve stenosis. Tricuspid Valve: The tricuspid valve is normal in structure. Tricuspid valve regurgitation is not demonstrated. No evidence of tricuspid stenosis. Aortic Valve: The aortic valve is tricuspid. Aortic valve regurgitation is not visualized. No aortic stenosis is present. Pulmonic Valve: The pulmonic valve was grossly normal. Pulmonic valve regurgitation is trivial. No evidence of pulmonic stenosis. Aorta: The aortic root is normal in size and structure. Venous: The inferior vena cava is normal in size with greater than 50% respiratory variability, suggesting right atrial pressure of 3 mmHg. IAS/Shunts: No atrial level shunt detected by color flow Doppler.  LEFT VENTRICLE PLAX 2D LVIDd:         3.45 cm  Diastology LVIDs:         2.10 cm  LV e' lateral: 5.59 cm/s LV PW:         0.90 cm  LV e' medial:  5.41 cm/s LV IVS:        1.20 cm LVOT diam:     1.90 cm LV SV:         94 LV SV Index:   60 LVOT Area:     2.84 cm  RIGHT VENTRICLE             IVC RV Basal diam:  2.30 cm     IVC diam: 1.70 cm RV S prime:     18.60 cm/s TAPSE (M-mode): 1.6 cm LEFT ATRIUM           Index       RIGHT ATRIUM           Index LA diam:      3.30 cm 2.12 cm/m  RA Area:     10.70 cm LA Vol (A2C): 32.5 ml 20.91 ml/m RA Volume:   24.00 ml  15.44 ml/m LA Vol (A4C): 25.5 ml 16.41 ml/m  AORTIC VALVE LVOT Vmax:   182.00 cm/s LVOT Vmean:  131.000 cm/s LVOT VTI:    0.331 m  AORTA Ao Root diam: 3.90 cm MV A velocity: 109.50 cm/s                             SHUNTS  Systemic VTI:  0.33 m                             Systemic Diam: 1.90 cm Cherlynn Kaiser MD Electronically signed by Cherlynn Kaiser MD Signature Date/Time: 01/28/2020/1:13:37 AM    Final        Medical Problem List and Plan: 1. Right visual field deficits with right inattention, delay in processing, right hemiplegia with tone as well as pusher tendency affecting ADLs  and mobility secondary to left hemisphere IPH with moderate edema and small foci of acute ischemia within right MCA/PCA watershed zone infarcts.  -patient may shower  -ELOS/Goals: 28-32 days/Min A  Admit to CIR 2.  Antithrombotics: -DVT/anticoagulation:  Mechanical: Sequential compression devices, below knee Bilateral lower extremities  -antiplatelet therapy: N/A 3. Chronic neck pain/Pain Management: Uses  Hydrocodone prn at home--will hold for now till mentation clears.   Monitor with increased exertion 4. Mood: LCSW to follow for evaluation and support.   -antipsychotic agents: N/A 5. Neuropsych: This patient is not capable of making decisions on her own behalf. 6. Skin/Wound Care: Routine pressure relief measures.  7. Fluids/Electrolytes/Nutrition: IVF ordered as intake poor due to lethargy. CMP ordered for tomorrow AM. 8. HTN: Monitor BP tid  Verapamil and lisinopril.   Monitor with increased mobility 9. Kleb pneumonia UTI: Ceftriaxone Day #1  Follow sensitivities.  10. Delirum: Received multiple doses of haldol on 08/02 but was brighter and interactive on 08/03 am. Now flat --family reporting agitation with bouts of lability today. D/c baclofen and vicodin as this in addition to UTI likely contributing to cognitive issues.    11. Hyponatremia: Baseline--appears to be chronic.   CMP ordered for tomorrow AM  Bary Leriche, PA-C 01/29/2020  I have personally performed a face to face diagnostic evaluation, including, but not limited to relevant history and physical exam findings, of this patient and developed relevant assessment and plan.  Additionally, I have reviewed and concur with the physician assistant's documentation above.  Delice Lesch, MD, ABPMR

## 2020-01-30 ENCOUNTER — Inpatient Hospital Stay (HOSPITAL_COMMUNITY): Payer: Medicare Other | Admitting: Speech Pathology

## 2020-01-30 ENCOUNTER — Inpatient Hospital Stay (HOSPITAL_COMMUNITY): Payer: Medicare Other

## 2020-01-30 DIAGNOSIS — I10 Essential (primary) hypertension: Secondary | ICD-10-CM

## 2020-01-30 DIAGNOSIS — A499 Bacterial infection, unspecified: Secondary | ICD-10-CM

## 2020-01-30 DIAGNOSIS — R410 Disorientation, unspecified: Secondary | ICD-10-CM

## 2020-01-30 DIAGNOSIS — I639 Cerebral infarction, unspecified: Secondary | ICD-10-CM

## 2020-01-30 DIAGNOSIS — N390 Urinary tract infection, site not specified: Secondary | ICD-10-CM

## 2020-01-30 DIAGNOSIS — T50905A Adverse effect of unspecified drugs, medicaments and biological substances, initial encounter: Secondary | ICD-10-CM

## 2020-01-30 DIAGNOSIS — I611 Nontraumatic intracerebral hemorrhage in hemisphere, cortical: Secondary | ICD-10-CM

## 2020-01-30 LAB — CBC WITH DIFFERENTIAL/PLATELET
Abs Immature Granulocytes: 0.02 10*3/uL (ref 0.00–0.07)
Basophils Absolute: 0 10*3/uL (ref 0.0–0.1)
Basophils Relative: 0 %
Eosinophils Absolute: 0 10*3/uL (ref 0.0–0.5)
Eosinophils Relative: 1 %
HCT: 40 % (ref 36.0–46.0)
Hemoglobin: 13.7 g/dL (ref 12.0–15.0)
Immature Granulocytes: 0 %
Lymphocytes Relative: 33 %
Lymphs Abs: 2.4 10*3/uL (ref 0.7–4.0)
MCH: 30.4 pg (ref 26.0–34.0)
MCHC: 34.3 g/dL (ref 30.0–36.0)
MCV: 88.7 fL (ref 80.0–100.0)
Monocytes Absolute: 0.8 10*3/uL (ref 0.1–1.0)
Monocytes Relative: 10 %
Neutro Abs: 4 10*3/uL (ref 1.7–7.7)
Neutrophils Relative %: 56 %
Platelets: 185 10*3/uL (ref 150–400)
RBC: 4.51 MIL/uL (ref 3.87–5.11)
RDW: 12.7 % (ref 11.5–15.5)
WBC: 7.3 10*3/uL (ref 4.0–10.5)
nRBC: 0 % (ref 0.0–0.2)

## 2020-01-30 LAB — COMPREHENSIVE METABOLIC PANEL
ALT: 33 U/L (ref 0–44)
AST: 31 U/L (ref 15–41)
Albumin: 3.7 g/dL (ref 3.5–5.0)
Alkaline Phosphatase: 60 U/L (ref 38–126)
Anion gap: 13 (ref 5–15)
BUN: 19 mg/dL (ref 8–23)
CO2: 23 mmol/L (ref 22–32)
Calcium: 9 mg/dL (ref 8.9–10.3)
Chloride: 97 mmol/L — ABNORMAL LOW (ref 98–111)
Creatinine, Ser: 0.84 mg/dL (ref 0.44–1.00)
GFR calc Af Amer: >60 mL/min (ref >60)
GFR calc non Af Amer: >60 mL/min (ref >60)
Glucose, Bld: 104 mg/dL — ABNORMAL HIGH (ref 70–99)
Potassium: 4 mmol/L (ref 3.5–5.1)
Sodium: 133 mmol/L — ABNORMAL LOW (ref 135–145)
Total Bilirubin: 1.2 mg/dL (ref 0.3–1.2)
Total Protein: 6.3 g/dL — ABNORMAL LOW (ref 6.5–8.1)

## 2020-01-30 LAB — URINE CULTURE: Culture: >=100000 — AB

## 2020-01-30 MED ORDER — CEPHALEXIN 250 MG PO CAPS
250.0000 mg | ORAL_CAPSULE | Freq: Three times a day (TID) | ORAL | Status: AC
  Administered 2020-01-31 – 2020-02-03 (×12): 250 mg via ORAL
  Filled 2020-01-30 (×12): qty 1

## 2020-01-30 NOTE — Progress Notes (Signed)
Jamse Arn, MD  Physician  Physical Medicine and Rehabilitation  PMR Pre-admission    Addendum  Date of Service:  01/29/2020 2:09 PM      Related encounter: ED to Hosp-Admission (Discharged) from 01/26/2020 in [facility]       Show:Clear all [x] Manual[x] Template[x] Copied  Added by: [x] Retta Diones, RN[x] Jamse Arn, MD  [] Hover for details PMR Admission Coordinator Pre-Admission Assessment  Patient: Alison Cuevas is an 74 y.o., female MRN: [id number] DOB: [birth year] Height: 5' (152.4 cm) Weight: 59 kg                                                                                                                                                  Insurance Information HMO:    PPO:      PCP:      IPA:      80/20:      OTHER:  PRIMARY: Medicare A and B      Policy#: [id number]      Subscriber: patient CM Name:        Phone#:       Fax#:   Pre-Cert#:        Employer: Retired Benefits:  Phone #: [phone]     Name: Verified through [employer] [facility]. Date: 09/26/06     Deduct: $1484      Out of Pocket Max: none      Life Max: N/A  CIR: 100%      SNF: 100 days Outpatient: 80%     Co-Pay: 20% Home Health: 100%      Co-Pay: none DME: 80%     Co-Pay: 20% Providers: patient's choice  SECONDARY: [employer] NAP      Policy#: [id number]      Phone#: [phone]  Financial Counselor:        Phone#:    The Data Collection Information Summary for patients in Inpatient Rehabilitation Facilities with attached Privacy Act [place] Records was provided and verbally reviewed with: Patient and Family  Emergency Contact Information Contact Information     Name Relation Home Work Mobile   [place] Daughter [phone]     Albertine, Lafoy   [place] Spouse [phone]     Jasmyn, Picha   [phone]      Current Medical History  Patient Admitting Diagnosis: L ICH  History  of Present Illness: a 74 y.o.femalewith history of HTN, anxiety d/o, DDD w/chronic painwho was admitted via [facility] with sudden onset of right-sided weakness with numbness that developed while she was working in her garden. CT of head done showing IPH left frontoparietal convexity with subdural extension and 4 mm left-to-right midline shift. She was transferred to [facility] for management and Cleviprex added for BP >140.CTA head neck was negative for LVO or significant stenosis or AVM.  CT venogram was negative for dural sinus thrombosis. UDS negative for opiates and positive for benzos. Dr. Leonie Man felt that bleed was hypertensive in nature--ASA discontinued. Patient has had issues with right lower extremity pain and spasms due to increase in tone.  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 2D echo showed EF of 65 to 70% with normal LVEF and no wall abnormality. Therapy evaluations completed yesterday revealing impairments due to right inattention, right visual deficits, delay in processing,right hemiplegia with tone as well as pusher tendencies with posterior/right lateral lean affecting ADLs and mobility. CIR recommended due to functional deficits.  Complete NIHSS TOTAL: 16 Glasgow Coma Scale Score: 14  Past Medical History      Past Medical History:  Diagnosis Date   Allergic rhinitis    maple pollen   Anxiety    Arthritis    Bowel obstruction (HCC)    Chronic low back pain    Dr. Trenton Gammon.  Has had back injection--bp elevated after.   Chronic pain syndrome    Chronic renal insufficiency, stage 2 (mild)    GFR 60-70   DDD (degenerative disc disease), cervical    Hx of ACDF (Dr. [name]).  Followed by Dr. [name].  Also, Dr. [name] do left C7-T1 intralaminal epidural injection.   Diverticulosis of  colon    DJD (degenerative joint disease)    Gallstones    GERD (gastroesophageal reflux disease)    Herpes zoster 07/08/2014   Hypercholesteremia    mild; pt declined statin trial 05/2014--needs recheck lipid panel at first f/u visit in 2017   Hypertension    Osteopenia 06/2017   T score -1.4 FRAX 15% / 2%   Ovarian cyst 2017   82019-robotic assisted bilatel SPO: all path benign.   Perianal dermatitis    prn cutivate   Phlebitis    Right knee injury 2018   Patellofemoral crush injury--Dr. [name].   Trochanteric bursitis of right hip    Recurrent (injection by Dr. 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 [name]). Recurrent fungal/bact OE.   Varicose vein    left leg     Family History  family history includes Breast cancer (age of onset: 22) in her sister; Diverticulitis in her mother; Hypertension in her mother; Leukemia in her brother; Lung disease in her brother; Lupus in her sister; Melanoma in her brother; Other in an other family member; Prostate cancer in her brother; Stomach cancer in her father.  Prior Rehab/Hospitalizations:  Has the patient had prior rehab or hospitalizations prior to admission? No  Has the patient had major surgery during 100 days prior to admission? No  Current Medications   Current Facility-Administered Medications:     stroke: mapping our early stages of recovery book, , Does not apply, Once, Donzetta Starch, NP   acetaminophen (TYLENOL) tablet 650 mg, 650 mg, Oral, Q4H PRN, 650 mg at 01/28/20 1207 **OR** acetaminophen (TYLENOL) 160 MG/5ML solution 650 mg, 650 mg, Per Tube, Q4H PRN **OR** acetaminophen (TYLENOL) suppository 650 mg, 650 mg, Rectal, Q4H PRN, Miachel Roux, Sharon L, NP   baclofen (LIORESAL) tablet 5 mg, 5 mg, Oral, BID, Biby, Sharon L, NP, 5 mg at 01/29/20 0915   cefTRIAXone (ROCEPHIN) 1 g in sodium chloride 0.9 % 100  mL IVPB, 1 g, Intravenous, Q24H, Biby, Massie Kluver, NP  chlorhexidine (PERIDEX) 0.12 % solution 15 mL, 15 mL, Mouth Rinse, BID, Biby, Sharon L, NP, 15 mL at 01/29/20 0914   Chlorhexidine Gluconate Cloth 2 % PADS 6 each, 6 each, Topical, Daily, Biby, Sharon L, NP, 6 each at 01/29/20 0915   fluticasone (FLONASE) 50 MCG/ACT nasal spray 2 spray, 2 spray, Each Nare, QHS, Biby, Sharon L, NP, 2 spray at 01/28/20 2312   HYDROcodone-acetaminophen (NORCO/VICODIN) 5-325 MG per tablet 1 tablet, 1 tablet, Oral, Q6H PRN, Donzetta Starch, NP, 1 tablet at 01/29/20 1343   labetalol (NORMODYNE) injection 5-20 mg, 5-20 mg, Intravenous, Q10 min PRN, Burnetta Sabin L, NP, 20 mg at 01/27/20 1602   lisinopril (ZESTRIL) tablet 10 mg, 10 mg, Oral, Daily, Biby, Sharon L, NP, 10 mg at 01/29/20 0914   loratadine (CLARITIN) tablet 10 mg, 10 mg, Oral, QODAY, Biby, Sharon L, NP, 10 mg at 01/29/20 0914   ondansetron (ZOFRAN) injection 4 mg, 4 mg, Intravenous, Q6H PRN, Miachel Roux, Sharon L, NP   pantoprazole (PROTONIX) EC tablet 40 mg, 40 mg, Oral, QHS, Biby, Sharon L, NP, 40 mg at 01/28/20 2257   senna-docusate (Senokot-S) tablet 1 tablet, 1 tablet, Oral, BID, Biby, Sharon L, NP, 1 tablet at 01/29/20 0914   verapamil (CALAN-SR) CR tablet 180 mg, 180 mg, Oral, Daily, Biby, Sharon L, NP, 180 mg at 01/29/20 0915  Patients Current Diet:  Diet Order             DIET DYS 3 Room service appropriate? Yes with Assist; Fluid consistency: Thin  Diet effective now                   Precautions / Restrictions Precautions Precautions: Fall Precaution Comments: Increased tone RUE (flexion)/RLE(extension) Cervical Brace: Soft collar, For comfort Restrictions Weight Bearing Restrictions: No   Has the patient had 2 or more falls or a fall with injury in the past year?No  Prior Activity Level Limited Community (1-2x/wk): Went to church on Thursday and Sunday.  Prior Functional Level Prior Function Level of  Independence: Independent Comments: Out in the yard putting out feed for the deer when this occurred, does IADLs, drives.  Self Care: Did the patient need help bathing, dressing, using the toilet or eating?  Independent  Indoor Mobility: Did the patient need assistance with walking from room to room (with or without device)? Independent  Stairs: Did the patient need assistance with internal or external stairs (with or without device)? Independent  Functional Cognition: Did the patient need help planning regular tasks such as shopping or remembering to take medications? Independent  Development worker, international aid / Equipment Home Equipment: Shower seat, Bedside commode, Environmental consultant - 2 wheels, Walker - 4 wheels, [place] - single point  Prior Device Use: Indicate devices/aids used by the patient prior to current illness, exacerbation or injury?  None  Current Functional Level Cognition  Arousal/Alertness:  (drowsy) Overall Cognitive Status: Impaired/Different from baseline Current Attention Level: Sustained Orientation Level: Oriented to person, Oriented to place, Disoriented to time, Disoriented to situation Following Commands: Follows one step commands inconsistently, Follows one step commands with increased time Safety/Judgement: Decreased awareness of safety, Decreased awareness of deficits General Comments: patient anxious and tearful upon entry, reporting "I just don't know what is going on" when re-oriented to place she reports "I don't know why I'm here"  Attention: Sustained Sustained Attention: Impaired Sustained Attention Impairment: Verbal basic Memory: Impaired Memory Impairment: Storage deficit, Retrieval deficit, Decreased recall of new information Awareness: Impaired Awareness Impairment: Intellectual impairment  Problem Solving: Impaired Problem Solving Impairment: Verbal complex Safety/Judgment: Impaired    Extremity Assessment (includes Sensation/Coordination)  Upper  Extremity Assessment: Defer to OT evaluation RUE Deficits / Details: Flexion tone, reflexive grip with palm touched RUE Coordination: decreased fine motor, decreased gross motor  Lower Extremity Assessment: RLE deficits/detail RLE Deficits / Details: Extensor tone, able to break with hip flexion and sitting EOB; sustained clonus. Limited DF AROM. RLE Sensation: decreased light touch RLE Coordination: decreased fine motor, decreased gross motor    ADLs  Overall ADL's : Needs assistance/impaired Eating/Feeding: Moderate assistance, Bed level Grooming: Wash/dry face, Bed level, Set up, Supervision/safety Upper Body Bathing: Maximal assistance, Bed level Lower Body Bathing: Total assistance, Bed level Upper Body Dressing : Total assistance, Bed level Lower Body Dressing: Total assistance, Bed level Toilet Transfer: +2 for physical assistance, Stand-pivot, Total assistance Toilet Transfer Details (indicate cue type and reason): bed>recliner going to pt's right Toileting- Clothing Manipulation and Hygiene: Total assistance Toileting - Clothing Manipulation Details (indicate cue type and reason): Max A +2 sit<>stand at EOB    Mobility  Overal bed mobility: Needs Assistance Bed Mobility: Rolling, Sidelying to Sit, Sit to Sidelying Rolling: Max assist Sidelying to sit: Max assist, +2 for physical assistance Supine to sit: Total assist, +2 for physical assistance Sit to sidelying: Total assist, +2 for safety/equipment, +2 for physical assistance General bed mobility comments: total assist to reposition to midline, slight L lateral lean with pillows under UEs for support     Transfers  Overall transfer level: Needs assistance Equipment used: 2 person hand held assist Transfer via Lift Equipment: Stedy Transfers: Sit to/from Stand, Risk manager Sit to Stand: Max assist, +2 physical assistance, +2 safety/equipment Stand pivot transfers: Total assist, +2 physical  assistance General transfer comment: used Stedy to allow better control of rt foot and ankle    Ambulation / Gait / Stairs / Wheelchair Mobility  Ambulation/Gait General Gait Details: Unable    Posture / Balance Dynamic Sitting Balance Sitting balance - Comments: Fair (5-10 seconds) to Poor (posterior lean requiring min-mod assist to correct ); had pt work on sittting balance at EOB (reaching with LUE reaching for footboard of bed and forward to arm of recliner, R forearm propping) Balance Overall balance assessment: Needs assistance Sitting-balance support: Single extremity supported Sitting balance-Leahy Scale: Poor Sitting balance - Comments: Fair (5-10 seconds) to Poor (posterior lean requiring min-mod assist to correct ); had pt work on sittting balance at EOB (reaching with LUE reaching for footboard of bed and forward to arm of recliner, R forearm propping) Standing balance support: Single extremity supported Standing balance-Leahy Scale: Poor Standing balance comment: mod assist using stedy    Special needs/care consideration Designated visitor daughter Judie Petit     Previous Home Environment (from acute therapy documentation) Living Arrangements: Spouse/significant other  Lives With: Spouse Available Help at Discharge: Family, Available 24 hours/day Type of Home: House Home Layout: One level Home Access: Stairs to enter Entrance Stairs-Rails: Right, Left, Can reach both Entrance Stairs-Number of Steps: 3 Additional Comments: husband is not in good health, pt needs to be able to take care of herself  Discharge Living Setting Plans for Discharge Living Setting: Patient's home, House, Lives with (comment) (Lives with husband) Type of Home at Discharge: House Discharge Home Layout: Two level, Full bath on main level, Able to live on main level with bedroom/bathroom Alternate Level Stairs-Number of Steps: Flight Discharge Home Access: Stairs to enter Entrance  Stairs-Rails: Right, Left, Can reach both  Entrance Stairs-Number of Steps: 3 at the front entry Discharge Bathroom Shower/Tub: Walk-in shower, Curtain Discharge Bathroom Toilet: Handicapped height Discharge Bathroom Accessibility: Yes How Accessible: Accessible via walker  Social/Family/Support Systems Patient Roles: Spouse, Parent (Has a spouse, 2 sons and 1 daughter.) Contact Information: Clinical biochemist Anticipated Caregiver: daughter Anticipated Caregiver's Contact Information: Judie Petit - daughter - [phone] Ability/Limitations of Caregiver: Daughter currently without work and can provide 24/7 care.  Dtr needs insurance so may need to go back to work by end of this year. Caregiver Availability: 24/7 Discharge Plan Discussed with Primary Caregiver: Yes Is Caregiver In Agreement with Plan?: No Does Caregiver/Family have Issues with Lodging/Transportation while Pt is in Rehab?: No   Goals Patient/Family Goal for Rehab: PT/OT min assist, SLP mod I goals Expected length of stay: 28-32 days Cultural Considerations: Is [employer]iness, wears no pants, no makeup and keeps her hair long. Pt/Family Agrees to Admission and willing to participate: Yes Program Orientation Provided & Reviewed with Pt/Caregiver Including Roles  & Responsibilities: Yes   Decrease burden of Care through IP rehab admission: N/A   Possible need for SNF placement upon discharge: Not anticipated   Patient Condition: This patient's condition remains as documented in the consult dated 01/28/20, in which the Rehabilitation Physician determined and documented that the patient's condition is appropriate for intensive rehabilitative care in an inpatient rehabilitation facility. Will admit to inpatient rehab today.  Preadmission Screen Completed By:  Retta Diones, RN, 01/29/2020 2:26 PM ______________________________________________________________________   Discussed status with Dr. Posey Pronto on 01/29/20 at 1245  and received approval for admission today.  Admission Coordinator:  Retta Diones, time1425/Date8/4/21       Revision History                          Note Details  Author Jamse Arn, MD File Time 01/29/2020 2:46 PM  Author Type Physician Status Addendum  Last Editor Jamse Arn, MD Service Physical Medicine and [place] # [id number] Admit Date 01/29/2020

## 2020-01-30 NOTE — Progress Notes (Signed)
Orthopedic Tech Progress Note Patient Details:  Alison Cuevas [birth year] [id number] Called in brace Patient ID: [name], female   DOB: 1946/03/18, 74 y.o.   MRN: [id number]   [name] 01/30/2020, 11:18 AM

## 2020-01-30 NOTE — Evaluation (Signed)
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 Allergic rhinitis    maple pollen  . Anxiety   . Arthritis   . Bowel obstruction ([place])   . Chronic low back pain    Dr. Trenton Gammon.  Has had back injection--bp elevated after.  . Chronic pain syndrome   . Chronic renal insufficiency, stage 2 (mild)    GFR 60-70  . DDD (degenerative disc disease), cervical    Hx of ACDF (Dr. [name]).  Followed by Dr. [name].  Also, Dr. [name] do left C7-T1 intralaminal epidural injection.  . Diverticulosis of colon   . DJD (degenerative joint disease)   . Gallstones   . GERD (gastroesophageal reflux disease)   . Herpes zoster 07/08/2014  . Hypercholesteremia    mild; pt declined statin trial 05/2014--needs recheck lipid panel at first f/u visit in 2017  . Hypertension   . Osteopenia 06/2017   T score -1.4 FRAX 15% / 2%  . Ovarian cyst 2017   [id number] assisted bilatel SPO: all path benign.  . Perianal dermatitis    prn cutivate  . Phlebitis   . Right knee injury 2018   Patellofemoral crush injury--Dr. [name].  . Trochanteric bursitis of right hip    Recurrent (injection by Dr. [name] 05/26/15)  . Tympanic membrane rupture, right 12/01/2016   As of 08/2018 pt set for tympanomastoidectomy and STSG (Dr. [name]). Recurrent fungal/bact OE.  . Varicose vein     left leg    Past Surgical History:  Past Surgical History:  Procedure Laterality Date  . ABDOMINAL HYSTERECTOMY  03/2015   TAH.  Last pap 2016.  No hx of abnl paps.  Per GYN, no further pap smears indicated.  . ANTERIOR AND POSTERIOR REPAIR N/A 03/18/2014   Procedure: Cystocele repair with graft, Vault suspension, Rectocele repair;  Surgeon: [name], MD;  Location: [facility];  Service: Urology;  Laterality: N/A;  . CHOLECYSTECTOMY    . CHOLECYSTECTOMY, LAPAROSCOPIC    . COLON SURGERY    . COLONOSCOPY  04/2006; 05/26/16   2007 (Dr. [name]): Normal.  04/2016 (Dr. [name]) normal except diverticulosis and decreased anal sphincter tone.  No repeat colonoscopy is recommended due to age.  . cspine surgery     Dr. Wiliam Ke level ant cerv discectomy /fusion w/plating  . CYSTO N/A 03/18/2014   Procedure: CYSTO;  Surgeon: [name], MD;  Location: [facility];  Service: Urology;  Laterality: N/A;  . DEXA  07/30/2015; 06/2018   2017 and 2020 -->Osteopenia--repeat 2 yrs.  Marland Kitchen LYSIS OF ADHESION N/A 01/31/2018   Procedure: POSSIBLE LYSIS OF ADHESION;  Surgeon: Everitt Amber, MD;  Location: [facility];  Service: Gynecology;  Laterality: N/A;  . [place]     with prolasped bledder repair  . RESECTION OF COLON     BENIGN TUMOR  . RIGHT EAR SURGERY  08/23/2017   Dr. 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 right ear surgery  12/12/2018   Right tympanomastoidectomy, ossiculoplasty with partial prosthesis, split thickness skin graft from postauricular skin 1x1cm, and right tragal cartilage graft 1x1 cm [facility])  . right hemicolectomy for diverticulitis with abscess  1993  . ROBOTIC ASSISTED BILATERAL SALPINGO OOPHERECTOMY Bilateral 01/31/2018   All PATH benign.  Procedure: XI ROBOTIC ASSISTED BILATERAL SALPINGO OOPHORECTOMY;  Surgeon: Everitt Amber, MD;   Location: [facility];  Service: Gynecology;  Laterality: Bilateral;  . TUBAL LIGATION      Assessment & Plan Clinical Impression: Patient is a 74 y.o. year old [place] female with history of HTN, anxiety d/o, DDD with chronic pain who was admitted via [facility] with sudden onset of right hemiparesis and numbness on 01/26/20 that developed while she was working in her garden.  History taken from daughter and chart review due to cognition. CT of head done showing IPH left frontoparietal convexity with subdural extension and 4 mm left-to-right midline shift.  She was transferred to [facility] for management and Cleviprex added for BP greater than 140.  CTA head was negative for LVO or significant stenosis or AVM.  CT venogram was negative for dural sinus thrombosis.  UDS negative for opiates but positive for benzodiazapines.  She also had issues with delirium even past admission requiring IV Haldol.    Dr. Leonie Man felt that bleed was hypertensive in nature and aspirin was discontinued.  Follow-up MRI brain personally reviewed, showing stable large left IPH.  Per report, unchanged 3.6 cm IPH in superior left hemisphere with moderate edema and small foci of acute ischemia within right MCA/PCA watershed zone.  Echocardiogram with EF of 65-70%. Normal LVH and no wall abnormality.  Patient has had issues with right extremity pain as well as spasms due to increase in tone.  Baclofen was added yesterday to help with spasticity.  Family also expresses some concerns of mild lethargy yesterday therefore urine culture was ordered and positive for Klebsiella.  Therefore started on ceftriaxone today.  She is noted to be more lethargic with family reporting agitation as well as confusion today.  Therapy evaluations revealed right visual field deficits with right inattention, delay in processing, right hemiplegia with tone as well as pusher tendency affecting ADLs and mobility.  CIR was recommended due to functional decline.  Please see preadmission assessment from earlier today as well. Patient transferred to CIR on 01/29/2020 .   Patient currently requires total with mobility secondary to muscle weakness and muscle joint tightness, decreased cardiorespiratoy endurance, impaired timing and sequencing, abnormal tone, unbalanced muscle activation, decreased coordination and decreased motor planning, decreased visual perceptual skills and decreased visual motor skills, decreased midline orientation, decreased attention to right, right side neglect and decreased motor planning, decreased initiation, decreased attention, decreased awareness, decreased problem solving, decreased safety awareness, decreased memory and delayed processing and decreased sitting balance, decreased standing balance, decreased postural control, hemiplegia and decreased balance strategies.  Prior to hospitalization, patient was independent  with mobility and lived with Spouse in a House home.  Home access is 3Stairs to enter.  Patient will benefit from skilled PT intervention to maximize safe functional mobility, minimize fall risk and decrease caregiver burden for planned discharge home with 24 hour assist.  Anticipate patient will benefit from HHPT with increased caregiver assistance or will benefit from SNF placement at discharge.  PT - End of Session Endurance Deficit: Yes   PT Evaluation Precautions/Restrictions  Precautions Precautions: Fall Precaution Comments: Increased tone RUE (flexion)/RLE(extension) Required Braces or Orthoses: Cervical Brace Cervical Brace: Soft collar;For comfort Restrictions Weight Bearing Restrictions: No Home Living/Prior Functioning Home Living Available Help at Discharge: Family;Available 24 hours/day Type of Home: House Home Access: Stairs to enter [employer] of Steps: 3 Entrance Stairs-Rails: Right;Left;Can reach both Home Layout: One level;Able to live on main level with bedroom/bathroom;Two  level (one level per chart, patient report 2 levels, but is a poor historian) Alternate Level Stairs-Rails: Can reach both;Left;Right Bathroom Shower/Tub: Walk-in shower;Door Bathroom Accessibility: Yes Additional Comments: husband is not in good health, patient's daughter can provide assist for a couple of months before she needs to return to work  Lives With: Spouse Prior Function Level of Independence: Independent with basic ADLs;Independent with homemaking with ambulation Vocation: Retired Comments: Out in the yard putting out feed for the deer when this occurred, does IADLs, drives. Vision/Perception  Vision - Assessment Eye Alignment: Within Functional Limits Ocular Range of Motion: Restricted on the right;Restricted on the left;Restricted looking up;Restricted looking down Alignment/Gaze Preference: Head turned;Gaze left Tracking/Visual Pursuits: Requires cues, head turns, or add eye shifts to track (only able to follow minimally outside of midline in all directions with heavy cues for tracking and additinal head turn L and eye movements) Saccades: Additional eye shifts occurred during testing;Impaired - to be further tested in functional context Convergence: Impaired - to be further tested in functional context Diplopia Assessment: Disappears with one eye closed;Objects split side to side;Present in near gaze Perception Perception: Impaired Inattention/Neglect: Does not attend to right visual field;Does not attend to right side of body Praxis Praxis: Impaired Praxis Impairment Details: Initiation;Motor planning  Cognition Overall Cognitive Status: Impaired/Different from baseline Arousal/Alertness: Awake/alert Orientation Level: Oriented to person Attention: Focused Focused Attention: Impaired Focused Attention Impairment: Verbal basic;Functional basic Sustained Attention: Impaired Sustained Attention Impairment: Verbal basic;Functional basic Memory: Impaired Awareness:  Impaired Awareness Impairment: Emergent impairment Problem Solving: Impaired Problem Solving Impairment: Verbal basic;Functional basic Safety/Judgment: Impaired Sensation Sensation Light Touch: Impaired Detail Light Touch Impaired Details: Impaired RLE;Absent RUE Proprioception: Impaired Detail Proprioception Impaired Details: Absent RUE;Absent RLE Coordination Gross Motor Movements are Fluid and Coordinated: No Fine Motor Movements are Fluid and Coordinated: No Coordination and Movement Description: Dense R hemiplegia Finger Nose Finger Test: unable on R Heel Shin Test: unable on R Motor  Motor Motor: Hemiplegia Motor - Skilled Clinical Observations: dense L hemi   Trunk/Postural Assessment  Cervical Assessment Cervical Assessment: Exceptions to [facility] (head forward) Thoracic Assessment Thoracic Assessment: Exceptions to [facility] (rounded shoudlers) Lumbar Assessment Lumbar Assessment: Exceptions to [facility] (post pelvic tilt, increased by R LE extensor tone) Postural Control Postural Control: Deficits on evaluation (absent R)  Balance Balance Balance Assessed: Yes Static Sitting Balance Static Sitting - Balance Support: No upper extremity supported;Feet supported Static Sitting - Level of Assistance: 3: Mod assist Dynamic Sitting Balance Dynamic Sitting - Balance Support: Feet supported Dynamic Sitting - Level of Assistance: 2: Max assist Static Standing Balance Static Standing - Balance Support: Left upper extremity supported;During functional activity Static Standing - Level of Assistance: 2: Max assist Dynamic Standing Balance Dynamic Standing - Balance Support: Left upper extremity supported;During functional activity Dynamic Standing - Level of Assistance: 2: Max assist (blocking R foot due to extensor tone) Extremity Assessment  RUE Assessment RUE Assessment: Exceptions to [facility] RUE Body System: Neuro Brunstrum levels for arm and hand: Arm;Hand Brunstrum level for arm:  Stage II Synergy is developing Brunstrum level for hand: Stage I Flaccidity  LUE Assessment LUE Assessment: Within Functional Limits RLE Assessment RLE Assessment: Exceptions to [facility] Active Range of Motion (AROM) Comments: limited DF, eversion, knee flexion, and hip flexion due to extensor tone General Strength Comments: Demonstrated no volitional movement throughout, uses extensor tone in standing RLE Tone RLE Tone: Severe;Hypertonic;Modified Ashworth Body Part - Modified Ashworth Scale: Gastrocnemius;Soleus;Hamstrings Hamstrings - Modified Ashworth Scale for Grading Hypertonia RLE: Considerable increase in muschle tone, passive movement difficult GASTROCNEMIUS - Modified Ashworth Scale for Grading Hypertonia RLE: Affected part(s) rigid in flexion or extension SOLEUS - Modified Ashworth Scale for Grading Hypertonia RLE: Affected part(s) rigid in flexion or extension RLE Tone Comments: constance EF with inversion of R ankle LLE Assessment LLE Assessment: Within Functional Limits Active Range of Motion (AROM) Comments: WFL for all functional mobility General Strength Comments: WFL for all functional mobility, unable to follow cues for fomal strength assessment due to cognitive deficits  Care Tool Care Tool Bed Mobility Roll left and right activity   Roll left and right assist level: Total Assistance - Patient < 25%    Sit to lying activity   Sit to lying assist level: Total Assistance - Patient < 25%    Lying to sitting edge of bed activity   Lying to sitting edge of bed assist level: Total Assistance - Patient < 25%     Care Tool Transfers Sit to stand transfer   Sit to stand assist level: Maximal Assistance - Patient 25 - 49%    Chair/bed transfer   Chair/bed transfer assist level: Maximal Assistance - Patient 25 - 49% (with total A for R foot placement in stand pivot)     Mining engineer transfer activity did not occur: Safety/medical concerns  (decreased motor control, R hemi)        Care Tool Locomotion Ambulation Ambulation activity did not occur: Safety/medical concerns (decreased motor control, R hemi)        Walk 10 feet activity Walk 10 feet activity did not occur: Safety/medical concerns       Walk 50 feet with 2 turns activity Walk 50 feet with 2 turns activity did not occur: Safety/medical concerns      Walk 150 feet activity Walk 150 feet activity did not occur: Safety/medical concerns      Walk 10 feet on uneven surfaces activity Walk 10 feet on uneven surfaces activity did not occur: Safety/medical concerns      Stairs Stair activity did not occur: Safety/medical concerns (decreased motor control, R hemi)        Walk up/down 1 step activity Walk up/down 1 step or curb (drop down) activity did not occur: Safety/medical concerns        Walk up/down 4 steps activity Walk up/down 4 steps or curb (drop down) activity did not occur: Safety/medical concerns     Walk up/down 12 steps activity Walk up/down 12 steps activity did not occur: Safety/medical concerns      Pick up small objects from floor Pick up small object from the floor (from standing position) activity did not occur: Safety/medical concerns      Wheelchair     Wheelchair activity did not occur: Safety/medical concerns (unable with and without skilled intervention due to poor motor planning and attention)      Wheel 50 feet with 2 turns activity Wheelchair 50 feet with 2 turns activity did not occur: Safety/medical concerns    Wheel 150 feet activity Wheelchair 150 feet activity did  not occur: Safety/medical concerns      Refer to Care Plan for Long Term Goals  SHORT TERM GOAL WEEK 1 PT Short Term Goal 1 (Week 1): Patient will perform bed mobility with mod A. PT Short Term Goal 2 (Week 1): Patient will perform basic transfers with mod A using LRAD. PT Short Term Goal 3 (Week 1): Patient will initate gait training. PT Short Term Goal  4 (Week 1): Patient will perform standing activities >5 min focused on weight bearing for R LE tone management.  Recommendations for other services: Neuropsych  Skilled Therapeutic Intervention In addition to the PT evaluation below, the patient performed the following skilled PT interventions:  Mobility Bed Mobility Bed Mobility: Rolling Right;Rolling Left;Supine to Sit;Sit to Supine Rolling Right: Maximal Assistance - Patient 25-49% Rolling Left: Maximal Assistance - Patient 25-49% Supine to Sit: Total Assistance - Patient < 25% Transfers Transfers: Sit to Stand;Stand to Sit;Stand Pivot Transfers Sit to Stand: Dependent - mechanical lift (x3 blocked R foot due to R LE extensor tone with foot in PF and inversion, provieded cues hip extension and midline orientation throughout) Stand to Sit: Dependent - mechanical lift (x3) Stand Pivot Transfers: Dependent - mechanical lift (x1) Transfer via Lift Equipment: Probation officer Ambulation: No Gait Gait: No Stairs / Additional Locomotion Stairs: No Architect: Yes Wheelchair Assistance: Total Assistance - Patient <25% Wheelchair Propulsion: Left lower extremity Wheelchair Parts Management: Needs assistance Distance: 2 feet, backwards when attempting to pull forward with multimodal cues, patient with significant difficulty with motor planing and problem solving  Neuromuscular Re-education: Patient performed the following sitting and standing balance activities for improved midline orientation: -sitting balance progressing from mod-min A with visual target for L shoulder to reduce R lean x4 min -standing balance in [place] with min A with mirror for visual feedback, focused trunk and visual midline orientation and attention to task 3x1-1.5 min, provided manual facilitation to reduce R foot inversion and promote DF in standing  Instructed pt in results of PT evaluation as detailed above, PT POC, rehab  potential, rehab goals, and discharge recommendations. Additionally discussed CIR's policies regarding fall safety and use of chair alarm and/or quick release belt. Pt verbalized understanding and in agreement. Will update pt's family members as they become available.    Discharge Criteria: Patient will be discharged from PT if patient refuses treatment 3 consecutive times without medical reason, if treatment goals not met, if there is a change in medical status, if patient makes no progress towards goals or if patient is discharged from hospital.  The above assessment, treatment plan, treatment alternatives and goals were discussed and mutually agreed upon: by patient  Doreene Burke PT, DPT  01/30/2020, 11:39 AM

## 2020-01-30 NOTE — Evaluation (Signed)
Speech Language Pathology Assessment and Plan  Patient Details  Name: Alison Cuevas MRN: [id number] Date of Birth: [birth year]  SLP Diagnosis: Cognitive Impairments;Dysphagia  Rehab Potential: Good ELOS: 3 to 4 weeks.    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 Trenton Gammon.  Has had back injection--bp elevated after.   Chronic pain syndrome    Chronic renal insufficiency, stage 2 (mild)    GFR 60-70   DDD (degenerative disc disease), cervical    Hx of ACDF (Dr. [name]).  Followed by Dr. [name].  Also, Dr. [name] do left C7-T1 intralaminal epidural injection.   Diverticulosis of colon    DJD (degenerative joint disease)    Gallstones    GERD (gastroesophageal reflux disease)    Herpes zoster 07/08/2014   Hypercholesteremia    mild; pt declined statin trial 05/2014--needs recheck lipid panel at first f/u visit in 2017   Hypertension    Osteopenia 06/2017   T score -1.4 FRAX 15% / 2%   Ovarian cyst 2017   [id number]-robotic assisted bilatel SPO: all path benign.   Perianal dermatitis    prn cutivate   Phlebitis    Right knee injury 2018   Patellofemoral crush injury--Dr. [name].   Trochanteric bursitis of right hip    Recurrent (injection by Dr. 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 [name]). Recurrent fungal/bact OE.   Varicose vein    left leg    Past Surgical History:  Past Surgical History:  Procedure Laterality Date   ABDOMINAL HYSTERECTOMY  03/2015   TAH.  Last pap 2016.  No hx of abnl paps.  Per GYN, no further pap smears indicated.    ANTERIOR AND POSTERIOR REPAIR N/A 03/18/2014   Procedure: Cystocele repair with graft, Vault suspension, Rectocele repair;  Surgeon: [name], MD;  Location: [facility];  Service: Urology;  Laterality: N/A;   CHOLECYSTECTOMY     CHOLECYSTECTOMY, LAPAROSCOPIC     COLON SURGERY     COLONOSCOPY  04/2006; 05/26/16   2007 (Dr. [name]): Normal.  04/2016 (Dr. [name]) normal except diverticulosis and decreased anal sphincter tone.  No repeat colonoscopy is recommended due to age.   cspine surgery     Dr. Wiliam Ke level ant cerv discectomy [name] w/plating   CYSTO N/A 03/18/2014   Procedure: CYSTO;  Surgeon: [name], MD;  Location: [facility];  Service: Urology;  Laterality: N/A;   DEXA  07/30/2015; 06/2018   2017 and 2020 -->Osteopenia--repeat 2 yrs.   LYSIS OF ADHESION N/A 01/31/2018   Procedure: POSSIBLE LYSIS OF ADHESION;  Surgeon: Everitt Amber, MD;  Location: [facility];  Service: Gynecology;  Laterality: N/A;   Dana     with prolasped bledder repair   RESECTION OF COLON     BENIGN TUMOR   RIGHT EAR SURGERY  08/23/2017   Dr. 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  Procedure: XI ROBOTIC ASSISTED BILATERAL SALPINGO OOPHORECTOMY;  Surgeon: Everitt Amber, MD;  Location: [facility];  Service: Gynecology;  Laterality: Bilateral;   TUBAL LIGATION      Assessment / Plan / Recommendation Clinical Impression   HPI: Alison Cuevas is a 74 year old [place] female with history of HTN, anxiety  d/o, DDD with chronic pain who was admitted via [facility] with sudden onset of right hemiparesis and numbness on 01/26/20 that developed while she was working in her garden.  History taken from daughter and chart review due to cognition. CT of head done showing IPH left frontoparietal convexity with subdural extension and 4 mm left-to-right midline shift.  She was transferred to [facility] for management and Cleviprex added for BP greater than 140.  CTA head was negative for LVO or significant stenosis or AVM.  CT venogram was negative for dural sinus thrombosis.  UDS negative for opiates but positive for benzodiazapines.  She also had issues with delirium even past admission requiring IV Haldol.    Dr. Leonie Man felt that bleed was hypertensive in nature and aspirin was discontinued.  Follow-up MRI brain personally reviewed, showing stable large left IPH.  Per report, unchanged 3.6 cm IPH in superior left hemisphere with moderate edema and small foci of acute ischemia within right MCA/PCA watershed zone.  Echocardiogram with EF of 65-70%. Normal LVH and no wall abnormality.  Patient has had issues with right extremity pain as well as spasms due to increase in tone.  Baclofen was added yesterday to help with spasticity.  Family also expresses some concerns of mild lethargy yesterday therefore urine culture was ordered and positive for Klebsiella.  Therefore started on ceftriaxone today.  She is noted to be more lethargic with family reporting agitation as well as confusion today.  Therapy evaluations revealed right visual field deficits with right inattention, delay in processing, right hemiplegia with tone as well as pusher tendency affecting ADLs and mobility.  CIR was recommended due to functional decline. Pt was admitted to CIR 01/29/2020 and SLP evaluations were completed 01/30/2020 with results as follows:  Pt presents with severe cognitive impairment with deficits in memory, attention,  basic problem  solving, right inattention, and safety awareness. She required maximum assistance for all basic functional tasks in these areas. Pt was oriented to location, situation, month and time. Subtests of the Cognistat were administered. Pt was able to recall all personal information with no cueing required. She was not able to recall city her daughter lives in even when given multiple verbal cues for location.  Pt was not able to repeat 4 words with immediate recall task. She recalled 1/4 with delayed recall of 4 words when given category cue. Pt scored 0/4 on mental calcuation subtest due to difficulty storing numbers for simple math calculations. Extended time and max A with visual cues and verbal cues needed for constructing visual design. Pt eventually was able to problem solve and construct visual design with SLP directing patient for each individual piece. Pt aware she needed help to get out of bed but unaware of how to get help which could be a safety concern. Expressive/receptive language skills were WFL.   Pt presents with mild oral dysphagia characterized by decreased  oral clearance with regular solids. Pt is on dysphagia 3 diet with thin liquids. Lingual strength, ROM, and coordination appeared [facility]. Facial symmetry was [facility]. Pt reported decreased sensation on right side of face. Cough was strong and upon bedside observation, swallow reflex appeared timely. Pt seen with trials of pureed and regular. Mastication was prolonged with graham cracker and peanut butter. Pt reported it was very dry and required small sips of water after initial swallow to clear minimal oral residue on posterior lingual surface. No overt s/sx of aspiration or penetration noted with all trials completed. Recommend pt continue dysphagia 3 thin liquid diet, medications whole, full supervision during meals due to cognition and ST will continue to work towards advancement.  Recommend pt receive skilled ST services while inpatient to address  cognitive impairments and mild dysphagia describe above in order to maximize functional independence and safety prior to discharge home.   Skilled Therapeutic Interventions          Administered bedside swallow and cognitive linguistic evaluation and results were reviewed with patient. Please see above for details regarding results.   SLP Assessment  Patient will need skilled Speech Lanaguage Pathology Services during CIR admission    Recommendations  SLP Diet Recommendations: Dysphagia 3 (Mech soft);Thin Liquid Administration via: Cup;Straw Medication Administration: Whole meds with liquid Supervision: Staff to assist with self feeding;Full supervision/cueing for compensatory strategies Compensations: Minimize environmental distractions;Slow rate;Small sips/bites Postural Changes and/or Swallow Maneuvers: Seated upright 90 degrees Oral Care Recommendations: Oral care BID Patient destination: Home Follow up Recommendations: 24 hour supervision/assistance;Outpatient SLP;Home Health SLP Equipment Recommended: None recommended by SLP    SLP Frequency 3 to 5 out of 7 days   SLP Duration  SLP Intensity  SLP Treatment/Interventions 3 to 4 weeks.  Minumum of 1-2 x/day, 30 to 90 minutes  Cognitive remediation/compensation;Dysphagia/aspiration precaution training;Functional tasks;Patient/family education    Pain Pain Assessment Pain Scale: Faces Faces Pain Scale: No hurt  Prior Functioning Cognitive/Linguistic Baseline: Within functional limits Type of Home: House  Lives With: Spouse Available Help at Discharge: Family;Available 24 hours/day Vocation: Retired  Programmer, systems Overall Cognitive Status: Impaired/Different from baseline Arousal/Alertness: Awake/alert Orientation Level: Oriented to person;Oriented to situation;Oriented to place;Disoriented to time (Oriented to month. Pt not oriented to year.) Attention: Sustained Focused Attention: Appears intact Focused  Attention Impairment: Verbal basic;Functional basic Sustained Attention: Impaired Sustained Attention Impairment: Verbal basic;Functional basic Memory: Impaired Memory Impairment: Storage deficit;Retrieval deficit;Decreased recall of new information;Decreased short term memory Decreased Short Term Memory: Verbal basic Awareness: Impaired Awareness Impairment: Emergent impairment Problem Solving: Impaired Problem Solving Impairment: Verbal basic;Functional complex Executive Function: Reasoning;Self Correcting;Organizing;Sequencing Reasoning: Impaired Reasoning Impairment: Verbal basic;Functional complex Sequencing: Impaired Sequencing Impairment: Functional complex;Verbal basic Organizing: Impaired Organizing Impairment: Verbal basic;Functional complex Initiating: Appears intact Self Correcting: Impaired Self Correcting Impairment: Verbal basic;Functional complex Safety/Judgment: Impaired  Comprehension Auditory Comprehension Overall Auditory Comprehension: Appears within functional limits for tasks assessed Yes/No Questions: Within Functional Limits Commands: Within Functional Limits (Difficulty with following multi step commands due to decreased attention and recall.) Conversation: Simple Interfering Components: Attention;Processing speed;Working Field seismologist: Extra processing time;Slowed speech;Repetition;Pausing Visual Recognition/Discrimination Discrimination: Within Function Limits Reading Comprehension Reading Status: Not tested Expression Expression Primary Mode of Expression: Verbal Verbal Expression Overall Verbal Expression: Appears within functional limits for tasks assessed Initiation: No impairment Automatic Speech: Name;Social Response;Month of year Level of Generative/Spontaneous Verbalization: Conversation Repetition: Impaired Level of Impairment: Phrase level Naming: No impairment Divergent: Not tested Pragmatics: No impairment Interfering  Components: Attention Effective Techniques: Open ended  questions Written Expression Dominant Hand: Right Written Expression: Not tested Oral Motor Oral Motor/Sensory Function Overall Oral Motor/Sensory Function: Mild impairment Facial ROM: Reduced right;Suspected CN VII (facial) dysfunction Facial Symmetry: Within Functional Limits Facial Strength: Within Functional Limits Facial Sensation: Reduced right Lingual ROM: Within Functional Limits Lingual Symmetry: Within Functional Limits Lingual Strength: Within Functional Limits Lingual Sensation: Within Functional Limits Velum: Within Functional Limits Mandible: Within Functional Limits Motor Speech Overall Motor Speech: Appears within functional limits for tasks assessed Respiration: Within functional limits Phonation: Normal Resonance: Within functional limits Articulation: Within functional limitis Intelligibility: Intelligible Motor Planning: Witnin functional limits Motor Speech Errors: Not applicable  Care Tool Care Tool Cognition Expression of Ideas and Wants Expression of Ideas and Wants: Some difficulty - exhibits some difficulty with expressing needs and ideas (e.g, some words or finishing thoughts) or speech is not clear   Understanding Verbal and Non-Verbal Content Understanding Verbal and Non-Verbal Content: Sometimes understands - understands only basic conversations or simple, direct phrases. Frequently requires cues to understand   Memory/Recall Ability *first 3 days only Memory/Recall Ability *first 3 days only: Current season;That he or she is in a hospital/hospital unit     PMSV Assessment  PMSV Trial Intelligibility: Intelligible  Bedside Swallowing Assessment General Date of Onset: 01/29/20 Previous Swallow Assessment: 01/28/2020 Diet Prior to this Study: Dysphagia 3 (soft);Thin liquids Temperature Spikes Noted: No Respiratory Status: Room air History of Recent Intubation: No Behavior/Cognition:  Alert;Pleasant mood;Confused;Distractible Oral Cavity - Dentition: Adequate natural dentition Self-Feeding Abilities: Needs assist;Needs set up Vision: Functional for self-feeding Patient Positioning: Upright in bed Baseline Vocal Quality: Normal Volitional Cough: Strong Volitional Swallow: Able to elicit  Oral Care Assessment Does patient have any of the following "high(er) risk" factors?: None of the above Does patient have any of the following "at risk" factors?: Other - dysphagia Patient is LOW RISK: Follow universal precautions (see row information) Ice Chips Ice chips: Within functional limits Presentation: Spoon Thin Liquid Thin Liquid: Within functional limits Presentation: Straw Nectar Thick Nectar Thick Liquid: Not tested Honey Thick Honey Thick Liquid: Not tested Puree Puree: Within functional limits Presentation: Spoon Solid Solid: Impaired Presentation: Spoon Oral Phase Impairments: Impaired mastication;Other (comment) Oral Phase Functional Implications: Oral residue BSE Assessment Risk for Aspiration Impact on safety and function: Mild aspiration risk Other Related Risk Factors: Cognitive impairment;History of GERD  Short Term Goals: Week 1: SLP Short Term Goal 1 (Week 1): Pt will consume trials of regular textures demonstrating efficient mastication and oral clearance across 2 sessions with no more than min A cues for oral clearance of residue. SLP Short Term Goal 2 (Week 1): Pt will sustain attention to functional tasks for 5 min with mod A verbal cues for redirection. SLP Short Term Goal 3 (Week 1): Pt will demonstrate recall of how to use call bell for assistance with Max A verbal cues. SLP Short Term Goal 4 (Week 1): Pt will demonstrate ability to problem solve basic familiar tasks with Max A verbal cues.  Refer to Care Plan for Long Term Goals  Recommendations for other services: None   Discharge Criteria: Patient will be discharged from SLP if patient  refuses treatment 3 consecutive times without medical reason, if treatment goals not met, if there is a change in medical status, if patient makes no progress towards goals or if patient is discharged from hospital.  The above assessment, treatment plan, treatment alternatives and goals were discussed and mutually agreed upon: by patient  [place] 01/30/2020, 3:26 PM

## 2020-01-30 NOTE — Progress Notes (Signed)
Alison Ribas, MD  Physician  Physical Medicine and Rehabilitation  Consult Note    Signed  Date of Service:  01/28/2020 9:17 AM      Related encounter: ED to Hosp-Admission (Discharged) from 01/26/2020 in [place] [facility]      Signed      Expand All Collapse All  Show:Clear all [x] Manual[x] Template[] Copied  Added by: [x] Love, Ivan Anchors, PA-C[x] Raulkar, Clide Deutscher, MD  [] Hover for details      Physical Medicine and Rehabilitation Consult  Reason for Consult: Functional deficits due to [place]. Referring Physician: Dr. Leonie Man  HPI: Alison Cuevas is a 74 y.o. female with history of HTN, anxiety d/o, DDD w/chronic pain who was admitted via [facility] with sudden onset of right-sided weakness with numbness that developed while she was working in her garden.  CT of head done showing IPH left frontoparietal convexity with subdural extension and 4 mm left-to-right midline shift.  She was transferred to [facility] for management and Cleviprex added for BP >140.  CTA head neck was negative for LVO or significant stenosis or AVM.  CT venogram was negative for dural sinus thrombosis.  UDS negative for opiates and positive for benzos.  Dr. Leonie Man felt that bleed was hypertensive in nature--ASA discontinued.  Patient has had issues with right lower extremity pain and spasms due to increase in tone.  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 2D echo showed EF of 65 to 70% with normal LVEF and no wall abnormality.  Therapy evaluations completed yesterday revealing impairments due to right inattention, right visual deficits, delay in processing,  right hemiplegia with tone as well as pusher tendencies with posterior/right lateral lean affecting ADLs and mobility.  CIR recommended due to functional deficits.  Review of Systems  Constitutional:  Negative for chills and fever.  HENT: Positive for hearing loss (right ear with tendency to have fungal infections.  HAS TO BE COVERED FOR bathing. ). Negative for tinnitus.   Eyes: Negative for blurred vision and double vision.  Respiratory: Negative for cough, shortness of breath and stridor.   Cardiovascular: Negative for chest pain, palpitations and leg swelling.  Gastrointestinal: Positive for heartburn. Negative for abdominal pain and nausea.  Genitourinary:       Tends to have frequent UTI.  Incontience  Musculoskeletal: Positive for myalgias and neck pain.  Neurological: Positive for sensory change, speech change and weakness.  Psychiatric/Behavioral: Positive for memory loss.         Past Medical History:  Diagnosis Date  . Allergic rhinitis    maple pollen  . Anxiety   . Arthritis   . Bowel obstruction ([place])   . Chronic low back pain    Dr. Trenton Gammon.  Has had back injection--bp elevated after.  . Chronic pain syndrome   . Chronic renal insufficiency, stage 2 (mild)    GFR 60-70  . DDD (degenerative disc disease), cervical    Hx of ACDF (Dr. [name]).  Followed by Dr. [name].  Also, Dr. [name] do left C7-T1 intralaminal epidural injection.  . Diverticulosis of colon   . DJD (degenerative joint disease)   . Gallstones   . GERD (gastroesophageal reflux disease)   . Herpes zoster 07/08/2014  . Hypercholesteremia    mild; pt declined statin trial 05/2014--needs recheck lipid panel at first f/u visit in 2017  . Hypertension   . Osteopenia 06/2017  T score -1.4 FRAX 15% / 2%  . Ovarian cyst 2017   [id number] assisted bilatel SPO: all path benign.  . Perianal dermatitis    prn cutivate  . Phlebitis   . Right knee injury 2018   Patellofemoral crush injury--Dr. [name].  . Trochanteric bursitis of right hip    Recurrent (injection by Dr. [name] 05/26/15)  . Tympanic membrane rupture, right 12/01/2016   As of 08/2018 pt  set for tympanomastoidectomy and STSG (Dr. [name]). Recurrent fungal/bact OE.  . Varicose vein    left leg          Past Surgical History:  Procedure Laterality Date  . ABDOMINAL HYSTERECTOMY  03/2015   TAH.  Last pap 2016.  No hx of abnl paps.  Per GYN, no further pap smears indicated.  . ANTERIOR AND POSTERIOR REPAIR N/A 03/18/2014   Procedure: Cystocele repair with graft, Vault suspension, Rectocele repair;  Surgeon: [name], MD;  Location: [facility];  Service: Urology;  Laterality: N/A;  . CHOLECYSTECTOMY    . CHOLECYSTECTOMY, LAPAROSCOPIC    . COLON SURGERY    . COLONOSCOPY  04/2006; 05/26/16   2007 (Dr. [name]): Normal.  04/2016 (Dr. [name]) normal except diverticulosis and decreased anal sphincter tone.  No repeat colonoscopy is recommended due to age.  . cspine surgery     Dr. Wiliam Ke level ant cerv discectomy /fusion w/plating  . CYSTO N/A 03/18/2014   Procedure: CYSTO;  Surgeon: [name], MD;  Location: [facility];  Service: Urology;  Laterality: N/A;  . DEXA  07/30/2015; 06/2018   2017 and 2020 -->Osteopenia--repeat 2 yrs.  Marland Kitchen LYSIS OF ADHESION N/A 01/31/2018   Procedure: POSSIBLE LYSIS OF ADHESION;  Surgeon: Everitt Amber, MD;  Location: [facility];  Service: Gynecology;  Laterality: N/A;  . [place]     with prolasped bledder repair  . RESECTION OF COLON     BENIGN TUMOR  . RIGHT EAR SURGERY  08/23/2017   Dr. 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 right ear surgery  12/12/2018   Right tympanomastoidectomy, ossiculoplasty with partial prosthesis, split thickness skin graft from postauricular skin 1x1cm, and right tragal cartilage graft 1x1 cm [facility])  . right hemicolectomy for diverticulitis with abscess  1993  . ROBOTIC ASSISTED BILATERAL SALPINGO OOPHERECTOMY Bilateral 01/31/2018    All PATH benign.  Procedure: XI ROBOTIC ASSISTED BILATERAL SALPINGO OOPHORECTOMY;  Surgeon: Everitt Amber, MD;  Location: [facility];  Service: Gynecology;  Laterality: Bilateral;  . TUBAL LIGATION           Family History  Problem Relation Age of Onset  . Hypertension Mother   . Diverticulitis Mother   . Breast cancer Sister 3  . Melanoma Brother   . Prostate cancer Brother   . Leukemia Brother   . Lupus Sister   . Lung disease Brother   . Stomach cancer Father   . Other Other        Family member with MGUS    Social History:  Married. Independent and drives/manages home. 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 Husband independent but unable to provide assistance. Family planning on providing assistance after d/c? She reports that she has never smoked. She has never used smokeless tobacco. She reports that she does not drink alcohol and does not use drugs.  Allergies  Allergen Reactions  . Gabapentin Other (See Comments)    Elevated heart rate and crazy dreams  . Prednisone Other (See Comments)    REACTION: funny feeling    Medications Prior to Admission  Medication Sig Dispense Refill  . ALPRAZolam (XANAX) 0.5 MG tablet TAKE 1 TABLET BY MOUTH THREE TIMES A DAY AS NEEDED FOR ANXIETY (Patient taking differently: Take 0.5 mg by mouth as needed for anxiety. ) 90 tablet 5  . aspirin (ASPIRIN 81) 81 MG EC tablet Take 81 mg by mouth daily.     . Biotin 10000 MCG TABS Take 10,000 mcg by mouth daily.     . calcium carbonate (OSCAL) 1500 (600 Ca) MG TABS tablet Take 600 mg of elemental calcium by mouth daily with breakfast.    . Carboxymethylcellul-Glycerin (LUBRICATING EYE DROPS OP) Place 1 drop into both eyes daily as needed (dry eyes).    . Cholecalciferol (VITAMIN D3) 5000 units CAPS Take 5,000 Units by mouth daily.    . famotidine (PEPCID) 40 MG tablet TAKE 1 TABLET BY MOUTH TWICE A DAY (Patient taking differently:  Take 40 mg by mouth as needed for heartburn or indigestion. ) 180 tablet 1  . fluticasone (FLONASE) 50 MCG/ACT nasal spray Place 2 sprays into both nostrils at bedtime. 48 g 3  . Lactobacillus-Inulin (CULTURELLE DIGESTIVE HEALTH PO) Take 1 tablet by mouth daily as needed (upset stomach).     . lisinopril (ZESTRIL) 10 MG tablet TAKE 1 TABLET BY MOUTH EVERY DAY (Patient taking differently: Take 10 mg by mouth daily. ) 90 tablet 1  . loratadine (CLARITIN) 10 MG tablet Take 10 mg by mouth every other day.     . meclizine (ANTIVERT) 12.5 MG tablet 1-2 tabs po tid prn (Patient taking differently: Take 12.5 mg by mouth as needed for dizziness. ) 60 tablet 1  . meloxicam (MOBIC) 15 MG tablet TAKE 1 TABLET BY MOUTH EVERY DAY WITH FOOD AS NEEDED FOR MUSCULOSKELETAL PAIN (Patient taking differently: Take 15 mg by mouth as needed for pain (musculoskeletal pain). ) 30 tablet 6  . Multiple Minerals-Vitamins (CALCIUM-MAGNESIUM-ZINC-D3 PO) Take 1 tablet by mouth daily.     . Multiple Vitamins-Minerals (MULTIVITAMIN ADULT) TABS Take by mouth daily.    . ondansetron (ZOFRAN) 4 MG tablet Take 1 tablet (4 mg total) by mouth every 8 (eight) hours as needed for nausea or vomiting. (Patient taking differently: Take 4 mg by mouth as needed for nausea or vomiting. ) 30 tablet 1  . POTASSIUM PO Take 1 tablet by mouth daily.     Marland Kitchen PREVIDENT 5000 BOOSTER PLUS 1.1 % PSTE Place 1 strip onto teeth 3 (three) times daily. After use, spit out but do not rinse.  6  . Simethicone (GAS-X PO) Take 1 tablet by mouth daily as needed (gas).    . verapamil (CALAN-SR) 180 MG CR tablet 1 tab po qd (Patient taking differently: Take 180 mg by mouth daily. ) 90 tablet 3  . vitamin B-12 (CYANOCOBALAMIN) 1000 MCG tablet Take 2,000 mcg by mouth daily.    . fluticasone (CUTIVATE) 0.05 % cream Apply to affected area bid prn (Patient not taking: Reported on 09/25/2019) 30 g 1  . HYDROcodone-acetaminophen (NORCO/VICODIN) 5-325 MG tablet 1  tab po tid prn pain (Patient not taking: Reported on 01/27/2020) 90 tablet 0  . triamcinolone ointment (KENALOG) 0.1 % Apply 1 application topically 2 (two) times daily. (Patient not taking: Reported on 01/01/2020) 30 g 1    Home: Home  Living Family/patient expects to be discharged to:: Inpatient rehab Living Arrangements: Spouse/significant other Available Help at Discharge: Family, Available 24 hours/day Type of Home: House Home Access: Stairs to enter [employer] of Steps: 3 Entrance Stairs-Rails: Right, Left, Can reach both Home Layout: One level Home Equipment: Shower seat, Bedside commode, Environmental consultant - 2 wheels, Walker - 4 wheels, [place] - single point Additional Comments: husband is not in good health, pt needs to be able to take care of herself  Lives With: Spouse  Functional History: Prior Function Level of Independence: Independent Comments: Out in the yard putting out feed for the deer when this occurred, does IADLs, drives. Functional Status:  Mobility: Bed Mobility Overal bed mobility: Needs Assistance Bed Mobility: Rolling, Supine to Sit Rolling: Total assist, +2 for physical assistance Supine to sit: Total assist, +2 for physical assistance General bed mobility comments: Pt with increased tone in RUE/RLE. RLE tone broke once we sat her up at EOB, with RUE staying with elbow flexed Transfers Overall transfer level: Needs assistance Equipment used: 2 person hand held assist Transfers: Sit to/from Stand, Stand Pivot Transfers Sit to Stand: Max assist, +2 physical assistance Stand pivot transfers: Max assist, +2 physical assistance General transfer comment: Able to come up to partial stand for RN to A with back peri care, stand pivot with gait belt and bed pad from bed to recliner (going to pt's right). Ambulation/Gait General Gait Details: Unable  ADL: ADL Overall ADL's : Needs assistance/impaired Eating/Feeding: Moderate assistance, Bed level Grooming:  Wash/dry face, Bed level, Set up, Supervision/safety Upper Body Bathing: Maximal assistance, Bed level Lower Body Bathing: Total assistance, Bed level Upper Body Dressing : Total assistance, Bed level Lower Body Dressing: Total assistance, Bed level Toilet Transfer: Maximal assistance, +2 for physical assistance, Stand-pivot Toilet Transfer Details (indicate cue type and reason): Bed>recliner going to her right ToiletingRunner, broadcasting/film/video and Hygiene: Total assistance Toileting - Clothing Manipulation Details (indicate cue type and reason): Max A +2 sit<>stand at EOB  Cognition: Cognition Overall Cognitive Status: Impaired/Different from baseline Arousal/Alertness:  (drowsy) Orientation Level: Oriented to person, Disoriented to place, Disoriented to time, Disoriented to situation Attention: Sustained Sustained Attention: Impaired Sustained Attention Impairment: Verbal basic Memory: Impaired Memory Impairment: Storage deficit, Retrieval deficit, Decreased recall of new information Awareness: Impaired Awareness Impairment: Intellectual impairment Problem Solving: Impaired Problem Solving Impairment: Verbal complex Safety/Judgment: Impaired Cognition Arousal/Alertness: Awake/alert Behavior During Therapy: Flat affect Overall Cognitive Status: Impaired/Different from baseline Area of Impairment: Following commands, Safety/judgement, Problem solving Following Commands: Follows one step commands inconsistently Safety/Judgement: Decreased awareness of safety, Decreased awareness of deficits Problem Solving: Slow processing, Decreased initiation, Difficulty sequencing, Requires verbal cues, Requires tactile cues General Comments: Right inattention, right visual field deficits likely   Blood pressure 138/65, pulse 82, temperature 97.9 F (36.6 C), temperature source Oral, resp. rate 15, height 5' (1.524 m), weight 59 kg, last menstrual period 06/27/1972, SpO2 94 %. Physical  Exam  General: Alert and oriented x 2, No apparent distress HEENT: Head is normocephalic, atraumatic, PERRLA, EOMI, sclera anicteric, oral mucosa pink and moist, dentition intact, ext ear canals clear,  Neck: Supple without JVD or lymphadenopathy Heart: Reg rate and rhythm. No murmurs rubs or gallops Chest: CTA bilaterally without wheezes, rales, or rhonchi; no distress Abdomen: Soft, non-tender, non-distended, bowel sounds positive. Extremities: No clubbing, cyanosis, or edema. Pulses are 2+ Skin: Clean and intact without signs of breakdown Neuro: Right facial weakness without dysarthria. Left gaze preference but able to turn eyes to right  field with max cues. Right visual field deficits. Sedated and fatigued appearing. Oriented to self --place and situation with minimal cues. Needed mod cues to recall other biograpic answers. She was able to follow simple one step motor commands with visual/tactile cues. Right hemiparesis with sensory deficits and extensor tone RLE. 0/5 on right side, 4/5 left side.   Musculoskeletal: Full ROM, No pain with AROM or PROM in the neck, trunk, or extremities. Posture appropriate Psych: Pt's affect is lethargic. Pt is cooperative   Lab Results Last 24 Hours       Results for orders placed or performed during the hospital encounter of 01/26/20 (from the past 24 hour(s))  Lipid panel     Status: None   Collection Time: 01/28/20  3:41 AM  Result Value Ref Range   Cholesterol 163 0 - 200 mg/dL   Triglycerides 33 <150 mg/dL   HDL 75 >40 mg/dL   Total CHOL/HDL Ratio 2.2 RATIO   VLDL 7 0 - 40 mg/dL   LDL Cholesterol 81 0 - 99 mg/dL  Hemoglobin A1c     Status: None   Collection Time: 01/28/20  3:41 AM  Result Value Ref Range   Hgb A1c MFr Bld 5.0 4.8 - 5.6 %   Mean Plasma Glucose 96.8 mg/dL      Imaging Results (Last 48 hours)  CT Angio Head W or Wo Contrast  Result Date: 01/26/2020 CLINICAL DATA:  Initial evaluation for acute intracranial  hemorrhage. EXAM: CT ANGIOGRAPHY HEAD AND NECK CT VENOGRAM HEAD TECHNIQUE: Multidetector CT imaging of the head and neck was performed using the standard protocol during bolus administration of intravenous contrast. Multiplanar CT image reconstructions and MIPs were obtained to evaluate the vascular anatomy. Carotid stenosis measurements (when applicable) are obtained utilizing NASCET criteria, using the distal internal carotid diameter as the denominator. CONTRAST:  61mL OMNIPAQUE IOHEXOL 350 MG/ML SOLN COMPARISON:  Prior CT from earlier the same day. FINDINGS: CTA NECK FINDINGS Aortic arch: Visualized aortic arch of normal caliber with normal 3 vessel morphology. Mild atheromatous change within the arch itself. No hemodynamically significant stenosis seen about the origin of the great vessels. Right carotid system: Right common carotid artery widely patent from its origin to the bifurcation. No significant atheromatous stenosis about the right bifurcation. Right ICA widely patent distally to the skull base without stenosis, dissection or occlusion. Left carotid system: Left common carotid artery widely patent from its origin to the bifurcation without stenosis. Minimal calcified plaque about the left bifurcation without stenosis. Left ICA widely patent distally to the skull base without stenosis, dissection or occlusion. Vertebral arteries: Both vertebral arteries arise from the subclavian arteries. No proximal subclavian artery stenosis. Left vertebral artery dominant. Vertebral arteries patent within the neck without stenosis, dissection or occlusion. Skeleton: No acute osseous abnormality. No discrete lytic or blastic osseous lesions. Prior ACDF at C3-C6 without adverse features. Other neck: No other acute soft tissue abnormality within the neck. Chronic left sphenoid sinusitis noted. No mass lesion or adenopathy. 8 mm right thyroid nodule noted, felt to be of doubtful significance given size and patient age.  No follow-up imaging recommended regarding this lesion. Upper chest: Visualized upper chest demonstrates no acute finding. Review of the MIP images confirms the above findings CTA HEAD FINDINGS Anterior circulation: Petrous segments widely patent bilaterally. Mild atheromatous change seen within the cavernous/supraclinoid ICAs without stenosis or other abnormality. ICA termini well perfused. Right A1 widely patent. Left A1 hypoplastic but patent as well. Normal anterior communicating artery  complex. Anterior cerebral arteries widely patent to their distal aspects without stenosis. No M1 stenosis or occlusion. Normal MCA bifurcations. Distal MCA branches well perfused and symmetric. Posterior circulation: Vertebral arteries widely patent to the vertebrobasilar junction without stenosis. Left vertebral artery dominant. Patent right PICA. Left PICA not definitely seen. Basilar widely patent to its distal aspect without stenosis. Superior cerebral arteries patent bilaterally. Right PCA supplied via the basilar. Fetal type origin left PCA. Both PCAs well perfused to their distal aspects without stenosis. No intracranial aneurysm. No vascular abnormality seen underlying the left frontoparietal hemorrhage. Venous sinuses: Normal opacification seen throughout the superior sagittal sinus to the torcula. Transverse and sigmoid sinuses are patent as are the proximal internal jugular veins. Straight sinus, vein of Galen, internal cerebral veins, and basal veins of Rosenthal patent. No evidence for dural sinus thrombosis. Anatomic variants: Hypoplastic left A1 segment. Fetal type origin of the left PCA. Review of the MIP images confirms the above findings IMPRESSION: 1. Negative CTA of the head and neck. No large vessel occlusion, hemodynamically significant stenosis, or other acute vascular abnormality. No vascular abnormality seen underlying the left cerebral hemorrhage. 2. Negative CT venogram.  No evidence for dural sinus  thrombosis. 3. Chronic left sphenoid sinusitis. Electronically Signed   By: Jeannine Boga M.D.   On: 01/26/2020 21:47   CT Angio Neck W and/or Wo Contrast  Result Date: 01/26/2020 CLINICAL DATA:  Initial evaluation for acute intracranial hemorrhage. EXAM: CT ANGIOGRAPHY HEAD AND NECK CT VENOGRAM HEAD TECHNIQUE: Multidetector CT imaging of the head and neck was performed using the standard protocol during bolus administration of intravenous contrast. Multiplanar CT image reconstructions and MIPs were obtained to evaluate the vascular anatomy. Carotid stenosis measurements (when applicable) are obtained utilizing NASCET criteria, using the distal internal carotid diameter as the denominator. CONTRAST:  34mL OMNIPAQUE IOHEXOL 350 MG/ML SOLN COMPARISON:  Prior CT from earlier the same day. FINDINGS: CTA NECK FINDINGS Aortic arch: Visualized aortic arch of normal caliber with normal 3 vessel morphology. Mild atheromatous change within the arch itself. No hemodynamically significant stenosis seen about the origin of the great vessels. Right carotid system: Right common carotid artery widely patent from its origin to the bifurcation. No significant atheromatous stenosis about the right bifurcation. Right ICA widely patent distally to the skull base without stenosis, dissection or occlusion. Left carotid system: Left common carotid artery widely patent from its origin to the bifurcation without stenosis. Minimal calcified plaque about the left bifurcation without stenosis. Left ICA widely patent distally to the skull base without stenosis, dissection or occlusion. Vertebral arteries: Both vertebral arteries arise from the subclavian arteries. No proximal subclavian artery stenosis. Left vertebral artery dominant. Vertebral arteries patent within the neck without stenosis, dissection or occlusion. Skeleton: No acute osseous abnormality. No discrete lytic or blastic osseous lesions. Prior ACDF at C3-C6 without  adverse features. Other neck: No other acute soft tissue abnormality within the neck. Chronic left sphenoid sinusitis noted. No mass lesion or adenopathy. 8 mm right thyroid nodule noted, felt to be of doubtful significance given size and patient age. No follow-up imaging recommended regarding this lesion. Upper chest: Visualized upper chest demonstrates no acute finding. Review of the MIP images confirms the above findings CTA HEAD FINDINGS Anterior circulation: Petrous segments widely patent bilaterally. Mild atheromatous change seen within the cavernous/supraclinoid ICAs without stenosis or other abnormality. ICA termini well perfused. Right A1 widely patent. Left A1 hypoplastic but patent as well. Normal anterior communicating artery complex. Anterior cerebral arteries  widely patent to their distal aspects without stenosis. No M1 stenosis or occlusion. Normal MCA bifurcations. Distal MCA branches well perfused and symmetric. Posterior circulation: Vertebral arteries widely patent to the vertebrobasilar junction without stenosis. Left vertebral artery dominant. Patent right PICA. Left PICA not definitely seen. Basilar widely patent to its distal aspect without stenosis. Superior cerebral arteries patent bilaterally. Right PCA supplied via the basilar. Fetal type origin left PCA. Both PCAs well perfused to their distal aspects without stenosis. No intracranial aneurysm. No vascular abnormality seen underlying the left frontoparietal hemorrhage. Venous sinuses: Normal opacification seen throughout the superior sagittal sinus to the torcula. Transverse and sigmoid sinuses are patent as are the proximal internal jugular veins. Straight sinus, vein of Galen, internal cerebral veins, and basal veins of Rosenthal patent. No evidence for dural sinus thrombosis. Anatomic variants: Hypoplastic left A1 segment. Fetal type origin of the left PCA. Review of the MIP images confirms the above findings IMPRESSION: 1. Negative  CTA of the head and neck. No large vessel occlusion, hemodynamically significant stenosis, or other acute vascular abnormality. No vascular abnormality seen underlying the left cerebral hemorrhage. 2. Negative CT venogram.  No evidence for dural sinus thrombosis. 3. Chronic left sphenoid sinusitis. Electronically Signed   By: Jeannine Boga M.D.   On: 01/26/2020 21:47   MR BRAIN W WO CONTRAST  Result Date: 01/27/2020 CLINICAL DATA:  Stroke follow-up EXAM: MRI HEAD WITHOUT AND WITH CONTRAST TECHNIQUE: Multiplanar, multiecho pulse sequences of the brain and surrounding structures were obtained without and with intravenous contrast. CONTRAST:  80mL GADAVIST GADOBUTROL 1 MMOL/ML IV SOLN COMPARISON:  Head CT 01/26/2020 FINDINGS: Brain: Unchanged 3.6 cm intraparenchymal hematoma in the superior left hemisphere. There is moderate surrounding edema. No midline shift or other mass effect. There is no abnormal contrast enhancement. Multifocal white matter hyperintensity, most commonly due to chronic ischemic microangiopathy. There are small foci of acute ischemia within the right MCA/PCA watershed zone. No chronic microhemorrhage. Normal midline structures. Vascular: Normal flow voids. Skull and upper cervical spine: Normal marrow signal. Sinuses/Orbits: Right mastoid fluid. Paranasal sinuses are clear. Normal orbits. Other: None IMPRESSION: 1. Unchanged 3.6 cm intraparenchymal hematoma in the superior left hemisphere with moderate surrounding edema. No midline shift. 2. Small foci of acute ischemia within the right MCA/PCA watershed zone. Electronically Signed   By: Ulyses Jarred M.D.   On: 01/27/2020 20:13   ECHOCARDIOGRAM COMPLETE  Result Date: 01/28/2020    ECHOCARDIOGRAM REPORT   Patient Name:   Tulsi Azeez Date of Exam: 01/27/2020 Medical Rec #:  [id number]     Height:       60.0 in Accession #:    [id number]    Weight:       130.0 lb Date of Birth:  [birth year]      BSA:          1.554 m Patient Age:    55  years      BP:           129/59 mmHg Patient Gender: F             HR:           93 bpm. Exam Location:  Inpatient Procedure: 2D Echo, Cardiac Doppler and Color Doppler Indications:    Stroke 434.9 / I163.9  History:        Patient has no prior history of Echocardiogram examinations.                 Risk Factors:Hypertension and  Dyslipidemia. CKD. GERD.  Sonographer:    Jonelle Sidle Dance Referring Phys: [place]  1. Left ventricular ejection fraction, by estimation, is 65 to 70%. The left ventricle has normal function. The left ventricle has no regional wall motion abnormalities. Left ventricular diastolic parameters are indeterminate.  2. Right ventricular systolic function is normal. The right ventricular size is normal. Tricuspid regurgitation signal is inadequate for assessing PA pressure.  3. The mitral valve is normal in structure. No evidence of mitral valve regurgitation. No evidence of mitral stenosis.  4. The aortic valve is tricuspid. Aortic valve regurgitation is not visualized. No aortic stenosis is present.  5. The inferior vena cava is normal in size with greater than 50% respiratory variability, suggesting right atrial pressure of 3 mmHg.  6. Increased flow velocities across LVOT, AV, and PV maybe secondary to anemia, thyrotoxicosis, hyperdynamic or high flow state. Conclusion(s)/Recommendation(s): No intracardiac source of embolism detected on this transthoracic study. A transesophageal echocardiogram is recommended to exclude cardiac source of embolism if clinically indicated. FINDINGS  Left Ventricle: Left ventricular ejection fraction, by estimation, is 65 to 70%. The left ventricle has normal function. The left ventricle has no regional wall motion abnormalities. The left ventricular internal cavity size was normal in size. There is  no left ventricular hypertrophy. Left ventricular diastolic parameters are indeterminate. Right Ventricle: The right ventricular size is normal.  No increase in right ventricular wall thickness. Right ventricular systolic function is normal. Tricuspid regurgitation signal is inadequate for assessing PA pressure. Left Atrium: Left atrial size was normal in size. Right Atrium: Right atrial size was normal in size. Pericardium: A small pericardial effusion is present. The pericardial effusion is anterior to the right ventricle. Mitral Valve: The mitral valve is normal in structure. Normal mobility of the mitral valve leaflets. No evidence of mitral valve regurgitation. No evidence of mitral valve stenosis. Tricuspid Valve: The tricuspid valve is normal in structure. Tricuspid valve regurgitation is not demonstrated. No evidence of tricuspid stenosis. Aortic Valve: The aortic valve is tricuspid. Aortic valve regurgitation is not visualized. No aortic stenosis is present. Pulmonic Valve: The pulmonic valve was grossly normal. Pulmonic valve regurgitation is trivial. No evidence of pulmonic stenosis. Aorta: The aortic root is normal in size and structure. Venous: The inferior vena cava is normal in size with greater than 50% respiratory variability, suggesting right atrial pressure of 3 mmHg. IAS/Shunts: No atrial level shunt detected by color flow Doppler.  LEFT VENTRICLE PLAX 2D LVIDd:         3.45 cm  Diastology LVIDs:         2.10 cm  LV e' lateral: 5.59 cm/s LV PW:         0.90 cm  LV e' medial:  5.41 cm/s LV IVS:        1.20 cm LVOT diam:     1.90 cm LV SV:         94 LV SV Index:   60 LVOT Area:     2.84 cm  RIGHT VENTRICLE             IVC RV Basal diam:  2.30 cm     IVC diam: 1.70 cm RV S prime:     18.60 cm/s TAPSE (M-mode): 1.6 cm LEFT ATRIUM           Index       RIGHT ATRIUM           Index LA diam:  3.30 cm 2.12 cm/m  RA Area:     10.70 cm LA Vol (A2C): 32.5 ml 20.91 ml/m RA Volume:   24.00 ml  15.44 ml/m LA Vol (A4C): 25.5 ml 16.41 ml/m  AORTIC VALVE LVOT Vmax:   182.00 cm/s LVOT Vmean:  131.000 cm/s LVOT VTI:    0.331 m  AORTA Ao Root diam:  3.90 cm MV A velocity: 109.50 cm/s                             SHUNTS                             Systemic VTI:  0.33 m                             Systemic Diam: 1.90 cm Cherlynn Kaiser MD Electronically signed by Cherlynn Kaiser MD Signature Date/Time: 01/28/2020/1:13:37 AM    Final    CT VENOGRAM HEAD  Result Date: 01/26/2020 CLINICAL DATA:  Initial evaluation for acute intracranial hemorrhage. EXAM: CT ANGIOGRAPHY HEAD AND NECK CT VENOGRAM HEAD TECHNIQUE: Multidetector CT imaging of the head and neck was performed using the standard protocol during bolus administration of intravenous contrast. Multiplanar CT image reconstructions and MIPs were obtained to evaluate the vascular anatomy. Carotid stenosis measurements (when applicable) are obtained utilizing NASCET criteria, using the distal internal carotid diameter as the denominator. CONTRAST:  57mL OMNIPAQUE IOHEXOL 350 MG/ML SOLN COMPARISON:  Prior CT from earlier the same day. FINDINGS: CTA NECK FINDINGS Aortic arch: Visualized aortic arch of normal caliber with normal 3 vessel morphology. Mild atheromatous change within the arch itself. No hemodynamically significant stenosis seen about the origin of the great vessels. Right carotid system: Right common carotid artery widely patent from its origin to the bifurcation. No significant atheromatous stenosis about the right bifurcation. Right ICA widely patent distally to the skull base without stenosis, dissection or occlusion. Left carotid system: Left common carotid artery widely patent from its origin to the bifurcation without stenosis. Minimal calcified plaque about the left bifurcation without stenosis. Left ICA widely patent distally to the skull base without stenosis, dissection or occlusion. Vertebral arteries: Both vertebral arteries arise from the subclavian arteries. No proximal subclavian artery stenosis. Left vertebral artery dominant. Vertebral arteries patent within the neck without stenosis,  dissection or occlusion. Skeleton: No acute osseous abnormality. No discrete lytic or blastic osseous lesions. Prior ACDF at C3-C6 without adverse features. Other neck: No other acute soft tissue abnormality within the neck. Chronic left sphenoid sinusitis noted. No mass lesion or adenopathy. 8 mm right thyroid nodule noted, felt to be of doubtful significance given size and patient age. No follow-up imaging recommended regarding this lesion. Upper chest: Visualized upper chest demonstrates no acute finding. Review of the MIP images confirms the above findings CTA HEAD FINDINGS Anterior circulation: Petrous segments widely patent bilaterally. Mild atheromatous change seen within the cavernous/supraclinoid ICAs without stenosis or other abnormality. ICA termini well perfused. Right A1 widely patent. Left A1 hypoplastic but patent as well. Normal anterior communicating artery complex. Anterior cerebral arteries widely patent to their distal aspects without stenosis. No M1 stenosis or occlusion. Normal MCA bifurcations. Distal MCA branches well perfused and symmetric. Posterior circulation: Vertebral arteries widely patent to the vertebrobasilar junction without stenosis. Left vertebral artery dominant. Patent right PICA. Left PICA not definitely seen. Basilar widely  patent to its distal aspect without stenosis. Superior cerebral arteries patent bilaterally. Right PCA supplied via the basilar. Fetal type origin left PCA. Both PCAs well perfused to their distal aspects without stenosis. No intracranial aneurysm. No vascular abnormality seen underlying the left frontoparietal hemorrhage. Venous sinuses: Normal opacification seen throughout the superior sagittal sinus to the torcula. Transverse and sigmoid sinuses are patent as are the proximal internal jugular veins. Straight sinus, vein of Galen, internal cerebral veins, and basal veins of Rosenthal patent. No evidence for dural sinus thrombosis. Anatomic variants:  Hypoplastic left A1 segment. Fetal type origin of the left PCA. Review of the MIP images confirms the above findings IMPRESSION: 1. Negative CTA of the head and neck. No large vessel occlusion, hemodynamically significant stenosis, or other acute vascular abnormality. No vascular abnormality seen underlying the left cerebral hemorrhage. 2. Negative CT venogram.  No evidence for dural sinus thrombosis. 3. Chronic left sphenoid sinusitis. Electronically Signed   By: Jeannine Boga M.D.   On: 01/26/2020 21:47   CT HEAD CODE STROKE WO CONTRAST  Result Date: 01/26/2020 CLINICAL DATA:  Code stroke. Initial evaluation for acute right-sided weakness. EXAM: CT HEAD WITHOUT CONTRAST TECHNIQUE: Contiguous axial images were obtained from the base of the skull through the vertex without intravenous contrast. COMPARISON:  None. FINDINGS: Brain: There is an acute intraparenchymal hemorrhage centered at the high left frontoparietal convexity measuring 3.6 x 2.6 x 3.0 cm (estimated volume 14 cc). Mild surrounding vasogenic edema. Associated subdural extension with a small subdural hemorrhage overlying the left cerebral convexity measuring up to 4 mm in thickness. Associated 4 mm left-to-right midline shift. No hydrocephalus or trapping. No other acute intracranial hemorrhage. No other acute large vessel territory infarct. Underlying age-related cerebral atrophy with moderate chronic microvascular ischemic disease. Small remote right cerebellar infarct noted. Vascular: No hyperdense vessel. Skull: Scalp soft tissues and calvarium within normal limits. Sinuses/Orbits: Globes and orbital soft tissues within normal limits. Left sphenoid sinusitis noted. Paranasal sinuses are otherwise clear. Sequelae of prior right mastoidectomy with opacification of the residual mastoid air cells. Other: None. IMPRESSION: 1. 3.6 x 2.6 x 3.0 cm acute intraparenchymal hemorrhage centered at the high left frontoparietal convexity (estimated  volume 14 cc). Associated subdural extension with a small subdural hemorrhage overlying the left cerebral convexity measuring up to 4 mm in thickness. Associated 4 mm left-to-right midline shift. No hydrocephalus or trapping. 2. Underlying age-related cerebral atrophy with moderate chronic microvascular ischemic disease. Critical Value/emergent results were called by telephone at the time of interpretation on 01/26/2020 at 7:34 pm to provider Sherwood Gambler , who verbally acknowledged these results. Electronically Signed   By: Jeannine Boga M.D.   On: 01/26/2020 19:38      Assessment/Plan: Diagnosis: Left ICH 1. Does the need for close, 24 hr/day medical supervision in concert with the patient's rehab needs make it unreasonable for this patient to be served in a less intensive setting? Yes 2. Co-Morbidities requiring supervision/potential complications: HTN, HLD, anxiety, degenerative joint disease, right ear implant 3. Due to bladder management, bowel management, safety, skin/wound care, disease management, medication administration, pain management and patient education, does the patient require 24 hr/day rehab nursing? Yes 4. Does the patient require coordinated care of a physician, rehab nurse, therapy disciplines of PT, OT, SLP to address physical and functional deficits in the context of the above medical diagnosis(es)? Yes Addressing deficits in the following areas: balance, endurance, locomotion, strength, transferring, bowel/bladder control, bathing, dressing, feeding, grooming, toileting, cognition and psychosocial support  5. Can the patient actively participate in an intensive therapy program of at least 3 hrs of therapy per day at least 5 days per week? Yes 6. The potential for patient to make measurable gains while on inpatient rehab is excellent 7. Anticipated functional outcomes upon discharge from inpatient rehab are min assist  with PT, min assist with OT, modified independent  with SLP. 8. Estimated rehab length of stay to reach the above functional goals is: 2-3 weeks 9. Anticipated discharge destination: Home 10. Overall Rehab/Functional Prognosis: excellent  RECOMMENDATIONS: This patient's condition is appropriate for continued rehabilitative care in the following setting: CIR Patient has agreed to participate in recommended program. Yes Note that insurance prior authorization may be required for reimbursement for recommended care.  Comment: Mrs. Reckner would be an excellent CIR candidate. She will have supervision from her husband at home but not much physical assistance given his physical disabilities. She has dense right sided hemiplegia. She would benefit from heating pad for her neck (nurse bringing right now). Sleepy from Baclofen and may benefit from frequency changed to night time only.   Bary Leriche, PA-C 01/28/2020   I have personally performed a face to face diagnostic evaluation, including, but not limited to relevant history and physical exam findings, of this patient and developed relevant assessment and plan.  Additionally, I have reviewed and concur with the physician assistant's documentation above.  Leeroy Cha, MD        Revision History                     Routing History           Note Details  Author Ranell Patrick, Clide Deutscher, MD File Time 01/28/2020 10:52 AM  Author Type Physician Status Signed  Last Editor Alison Ribas, MD Service Physical Medicine and [place] # [id number] Admit Date 01/29/2020

## 2020-01-30 NOTE — Progress Notes (Signed)
Inpatient [facility] Individual Statement of Services  Patient Name:  Alison Cuevas  Date:  01/30/2020  Welcome to the [facility].  Our goal is to provide you with an individualized program based on your diagnosis and situation, designed to meet your specific needs.  With this comprehensive rehabilitation program, you will be expected to participate in at least 3 hours of rehabilitation therapies Monday-Friday, with modified therapy programming on the weekends.  Your rehabilitation program will include the following services:  Physical Therapy (PT), Occupational Therapy (OT), Speech Therapy (ST), 24 hour per day rehabilitation nursing, Therapeutic Recreation (TR), Neuropsychology, Care Coordinator, Rehabilitation Medicine, Nutrition Services and Pharmacy Services  Weekly team conferences will be held on Tuesdays to discuss your progress.  Your Inpatient Rehabilitation Care Coordinator will talk with you frequently to get your input and to update you on team discussions.  Team conferences with you and your family in attendance may also be held.  Expected length of stay: 28 days  Overall anticipated outcome: Minimal Assistance  Depending on your progress and recovery, your program may change. Your Inpatient Rehabilitation Care Coordinator will coordinate services and will keep you informed of any changes. Your Inpatient Rehabilitation Care Coordinator's name and contact numbers are listed  below.  The following services may also be recommended but are not provided by the [facility]:    [place] will be made to provide these services after discharge if needed.  Arrangements include referral to agencies that provide these services.  Your insurance has been verified to be:  [facility] [employer] Your primary doctor is:  Julien Nordmann, MD  Pertinent information will be shared  with your doctor and your insurance company.  Inpatient Rehabilitation Care Coordinator:  Cathleen Corti [phone] or ([phone]  Information discussed with and copy given to patient by: Margarito Liner, 01/30/2020, 12:31 PM

## 2020-01-30 NOTE — Progress Notes (Signed)
Bilateral lower extremity venous duplex completed. Refer to "CV Proc" under chart review to view preliminary results.  01/30/2020 4:33 PM Kelby Aline., MHA, RVT, RDCS, RDMS

## 2020-01-30 NOTE — Evaluation (Signed)
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 Allergic rhinitis    maple pollen  . Anxiety   . Arthritis   . Bowel obstruction ([place])   . Chronic low back pain    Dr. Trenton Gammon.  Has had back injection--bp elevated after.  . Chronic pain syndrome   . Chronic renal insufficiency, stage 2 (mild)    GFR 60-70  . DDD (degenerative disc disease), cervical    Hx of ACDF (Dr. [name]).  Followed by Dr. [name].  Also, Dr. [name] do left C7-T1 intralaminal epidural injection.  . Diverticulosis of colon   . DJD (degenerative joint disease)   . Gallstones   . GERD (gastroesophageal reflux disease)   . Herpes zoster 07/08/2014  . Hypercholesteremia    mild; pt declined statin trial 05/2014--needs recheck lipid panel at first f/u visit in 2017  . Hypertension   . Osteopenia 06/2017   T score -1.4 FRAX 15% / 2%  . Ovarian cyst 2017   [id number]-robotic assisted bilatel SPO: all path benign.  . Perianal dermatitis    prn cutivate  . Phlebitis   . Right knee injury 2018   Patellofemoral crush injury--Dr. [name].  . Trochanteric bursitis of right hip    Recurrent (injection by Dr. [name] 05/26/15)  . Tympanic membrane rupture, right 12/01/2016   As of 08/2018 pt set for tympanomastoidectomy and STSG (Dr. [name]). Recurrent fungal/bact OE.  . Varicose vein    left leg    Past Surgical History:  Past Surgical History:  Procedure  Laterality Date  . ABDOMINAL HYSTERECTOMY  03/2015   TAH.  Last pap 2016.  No hx of abnl paps.  Per GYN, no further pap smears indicated.  . ANTERIOR AND POSTERIOR REPAIR N/A 03/18/2014   Procedure: Cystocele repair with graft, Vault suspension, Rectocele repair;  Surgeon: [name], MD;  Location: [facility];  Service: Urology;  Laterality: N/A;  . CHOLECYSTECTOMY    . CHOLECYSTECTOMY, LAPAROSCOPIC    . COLON SURGERY    . COLONOSCOPY  04/2006; 05/26/16   2007 (Dr. [name]): Normal.  04/2016 (Dr. [name]) normal except diverticulosis and decreased anal sphincter tone.  No repeat colonoscopy is recommended due to age.  . cspine surgery     Dr. Wiliam Ke level ant cerv discectomy /fusion w/plating  . CYSTO N/A 03/18/2014   Procedure: CYSTO;  Surgeon: [name], MD;  Location: [facility];  Service: Urology;  Laterality: N/A;  . DEXA  07/30/2015; 06/2018   2017 and 2020 -->Osteopenia--repeat 2 yrs.  Marland Kitchen LYSIS OF ADHESION N/A 01/31/2018   Procedure: POSSIBLE LYSIS OF ADHESION;  Surgeon: Everitt Amber, MD;  Location: [facility];  Service: Gynecology;  Laterality: N/A;  . [place]     with prolasped bledder repair  . RESECTION OF COLON     BENIGN TUMOR  . RIGHT EAR SURGERY  08/23/2017   Dr. 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 right ear surgery  12/12/2018   Right tympanomastoidectomy, ossiculoplasty with partial prosthesis, split thickness skin graft from postauricular skin 1x1cm, and right tragal cartilage graft 1x1 cm [facility]  . right hemicolectomy for diverticulitis with abscess  1993  . ROBOTIC ASSISTED BILATERAL SALPINGO OOPHERECTOMY Bilateral 01/31/2018   All PATH benign.  Procedure: XI ROBOTIC ASSISTED BILATERAL SALPINGO OOPHORECTOMY;  Surgeon: Everitt Amber, MD;  Location: [facility];  Service: Gynecology;  Laterality: Bilateral;  . TUBAL LIGATION       Assessment & Plan Clinical Impression: Alison Cuevas is a 74 y.o. female past medical history of chronic low back pain, degenerative disc disease, hypertension, hyperlipidemia, anxiety, presented to [facility] emergency room as an acute code stroke for sudden onset of right-sided weakness.Stat head CT done revealed a intraparenchymal hematoma centered at the left frontoparietal convexity of 14 cc volume with associated subdural extension with a small subdural hemorrhage overlying the left cerebral convexity measuring up to 4 mm in thickness  Patient currently requires total with basic self-care skills secondary to muscle weakness, decreased cardiorespiratoy endurance, impaired timing and sequencing, abnormal tone, unbalanced muscle activation, motor apraxia, decreased coordination and decreased motor planning, decreased visual acuity, decreased visual perceptual skills and decreased visual motor skills, decreased midline orientation and right side neglect, decreased initiation, decreased attention, decreased awareness, decreased problem solving, decreased safety awareness, decreased memory and delayed processing and decreased sitting balance, decreased standing balance, decreased postural control, hemiplegia and decreased balance strategies.  Prior to hospitalization, patient could complete BADL/IADL with independent .  Patient will benefit from skilled intervention to decrease level of assist with basic self-care skills and increase independence with basic self-care skills prior to discharge home with care partner.  Anticipate patient will require 24 hour supervision and minimal physical assistance and follow up home health.  OT - End of Session Activity Tolerance: Tolerates 10 - 20 min activity with multiple rests Endurance Deficit: Yes OT Assessment Rehab Potential (ACUTE ONLY): Good OT Barriers to Discharge: Inaccessible home environment;Decreased caregiver support;Home environment  access/layout;Lack of/limited family support OT Barriers to Discharge Comments: husband unable to provide physical assist at d/c OT Patient demonstrates impairments in the following area(s): Balance;Cognition;Endurance;Motor;Pain;Perception;Safety;Sensory OT Basic ADL's Functional Problem(s): Grooming;Bathing;Dressing;Toileting OT Advanced ADL's Functional Problem(s): Simple Meal Preparation OT Transfers Functional Problem(s): Toilet;Tub/Shower OT Additional Impairment(s): Fuctional Use of Upper Extremity OT Plan OT Intensity: Minimum of 1-2 x/day, 45 to 90 minutes OT Frequency: 5 out of 7 days OT Duration/Estimated Length of Stay: 3-4 weeks OT Treatment/Interventions: Balance/vestibular training;DME/adaptive equipment instruction;Patient/family education;Therapeutic Activities;Wheelchair propulsion/positioning;Cognitive remediation/compensation;Functional electrical stimulation;Psychosocial support;Therapeutic Exercise;UE/LE Strength taining/ROM;Self Care/advanced ADL retraining;Functional mobility training;Community reintegration;Discharge planning;Neuromuscular re-education;Skin care/wound managment;UE/LE Coordination activities;Visual/perceptual remediation/compensation;Splinting/orthotics;Pain management;Disease mangement/prevention OT Self Feeding Anticipated Outcome(s): S OT Basic Self-Care Anticipated Outcome(s): S OT Toileting Anticipated Outcome(s): MIN A OT Bathroom Transfers Anticipated Outcome(s): MIN A OT Recommendation Patient destination: Home (v SNF) Follow Up Recommendations: Home health OT Equipment Recommended: 3 in 1 bedside comode;Tub/shower seat   OT Evaluation Precautions/Restrictions  Precautions Precautions: Fall Precaution Comments: Increased tone RUE (flexion)/RLE(extension) Required Braces or Orthoses: Cervical Brace Cervical Brace: Soft collar;For comfort Restrictions Weight Bearing Restrictions: No General Chart Reviewed: Yes Family/Caregiver Present:  No Vital Signs  Pain   no pain reported Home Living/Prior Functioning Home Living Family/patient expects to be discharged to:: Inpatient rehab Living Arrangements: Spouse/significant other Available Help at Discharge: Family, Available 24 hours/day Type of Home: House Home Access: Stairs to enter [employer] of Steps: 3 Entrance Stairs-Rails: Right,  Left, Can reach both Home Layout: One level Bathroom Shower/Tub: Gaffer, Door Bathroom Accessibility: Yes Additional Comments: husband is not in good health, pt needs to be able to take care of herself  Lives With: Spouse Prior Function Level of Independence: Independent with basic ADLs, Independent with homemaking with ambulation Comments: Out in the yard putting out feed for the deer when this occurred, does IADLs, drives. Vision Eye Alignment: Within Functional Limits Ocular Range of Motion: Within Functional Limits Tracking/Visual Pursuits: Decreased smoothness of horizontal tracking;Decreased smoothness of vertical tracking Visual Fields:  (R inaatention v R field cut) Perception  Perception: Impaired Inattention/Neglect: Does not attend to right visual field;Does not attend to right side of body Praxis Praxis: Impaired Praxis Impairment Details: Motor planning;Initiation Cognition Overall Cognitive Status: Impaired/Different from baseline Arousal/Alertness: Awake/alert Orientation Level: Person;Place;Situation Person: Oriented Place: Oriented Situation: Oriented Year:  (2007) Month: August Day of Week: Incorrect Memory: Impaired Immediate Memory Recall: Sock;Blue;Bed Memory Recall Sock: Not able to recall (able to recall in field of 2) Memory Recall Blue: Without Cue Memory Recall Bed: Not able to recall Sustained Attention: Impaired Awareness: Impaired Safety/Judgment: Impaired Sensation Sensation Light Touch: Impaired by gross assessment (impaured LUE/LE, deep receptors in tact LLE) Light  Touch Impaired Details: Absent LUE;Absent LLE Coordination Gross Motor Movements are Fluid and Coordinated: No Fine Motor Movements are Fluid and Coordinated: No Motor  Motor Motor: Hemiplegia Motor - Skilled Clinical Observations: dense L hemi  Trunk/Postural Assessment  Cervical Assessment Cervical Assessment:  (head forward) Thoracic Assessment Thoracic Assessment:  (rounded shoudlers) Lumbar Assessment Lumbar Assessment:  (post pelvic tilt) Postural Control Postural Control: Deficits on evaluation (absent R)  Balance Balance Balance Assessed: Yes Static Sitting Balance Static Sitting - Level of Assistance: 4: Min assist Dynamic Sitting Balance Dynamic Sitting - Level of Assistance: 3: Mod assist Static Standing Balance Static Standing - Level of Assistance: 2: Max assist Extremity/Trunk Assessment RUE Assessment RUE Assessment: Exceptions to [facility] RUE Body System: Neuro Brunstrum levels for arm and hand: Arm;Hand Brunstrum level for arm: Stage II Synergy is developing Brunstrum level for hand: Stage I Flaccidity LUE Assessment LUE Assessment: Within Functional Limits  Care Tool Care Tool Self Care Eating   Eating Assist Level: Supervision/Verbal cueing    Oral Care  Oral care, brush teeth, clean dentures activity did not occur: Refused      Bathing   Body parts bathed by patient: Chest;Abdomen;Front perineal area;Right upper leg;Face Body parts bathed by helper: Right arm;Left arm;Buttocks;Left upper leg;Right lower leg;Left lower leg   Assist Level: Maximal Assistance - Patient 24 - 49%    Upper Body Dressing(including orthotics)   What is the patient wearing?: Pull over shirt   Assist Level: Total Assistance - Patient < 25%    Lower Body Dressing (excluding footwear)   What is the patient wearing?: Incontinence brief;Pants Assist for lower body dressing: Total Assistance - Patient < 25%    Putting on/Taking off footwear   What is the patient wearing?:  Non-skid slipper socks Assist for footwear: Dependent - Patient 0%       Care Tool Toileting Toileting activity   Assist for toileting: 2 Helpers     Care Tool Bed Mobility Roll left and right activity   Roll left and right assist level: 2 Helpers    Sit to lying activity        Lying to sitting edge of bed activity         Care Tool Transfers Sit to stand transfer  Sit to stand assist level: Dependent - Patient 0% (stedy)    Chair/bed transfer   Chair/bed transfer assist level: Dependent - mechanical lift (stedy)     Toilet transfer   Assist Level: Dependent - Patient 0% (stedy)     Care Tool Cognition Expression of Ideas and Wants     Understanding Verbal and Non-Verbal Content     Memory/Recall Ability *first 3 days only      Refer to Care Plan for Long Term Goals  SHORT TERM GOAL WEEK 1 OT Short Term Goal 1 (Week 1): Pt will recall hemi dressing techniques with min VC OT Short Term Goal 2 (Week 1): Pt will visually attend to R arm during bathing tasks to demo  improve R inattention OT Short Term Goal 3 (Week 1): Pt will transfer to [facility] with MAX A of 1 caregiver to decrease BOC for toileting OT Short Term Goal 4 (Week 1): Pt will locate 2/2 grooming items on R of sink for improved R inattention  Recommendations for other services: Neuropsych and Therapeutic Recreation  Stress management   Skilled Therapeutic Intervention Pt received in bed and educated on OT role/purpose, CIR, ELOS and CVA recovery. See below for ADL details. Of note, pt able to bridge hips with A to place RLE into hooklying and OT advances pants past hips. Pt requires up to MOD A for sitting balance with R and posterior lean. Able to correct with reaching far L to target. Pt transfers in stedy with MOD +1 but requries facilittation to keep R foot flexed onto stedy platform d/t tone. Pt demo profound R inattention, attention and memory deficits as well as abnormal tone impacting ADL performance.  Unsure if it will be safe for pt to go home as there is likely no caregiver to be able to provide they physical A anticipated at DC. Exited session wih tp tseated in bed, exit alarm on and call light tn reach  ADL ADL Upper Body Bathing: Maximal cueing;Moderate assistance Where Assessed-Upper Body Bathing: Wheelchair;Sitting at sink Lower Body Bathing: Maximal assistance;Maximal cueing Where Assessed-Lower Body Bathing: Bed level Upper Body Dressing: Maximal assistance;Maximal cueing Where Assessed-Upper Body Dressing: Sitting at sink;Wheelchair Lower Body Dressing: Dependent Where Assessed-Lower Body Dressing: Bed level (able to bridge hips) Toilet Transfer: Dependent (stedy) Mobility  Bed Mobility Bed Mobility: Rolling Left;Rolling Right;Supine to Sit Rolling Right: Moderate Assistance - Patient 50-74% Rolling Left: Total Assistance - Patient < 25% Supine to Sit: Total Assistance - Patient < 25% Transfers Sit to Stand: Dependent - mechanical lift Stand to Sit: Dependent - mechanical lift   Discharge Criteria: Patient will be discharged from OT if patient refuses treatment 3 consecutive times without medical reason, if treatment goals not met, if there is a change in medical status, if patient makes no progress towards goals or if patient is discharged from hospital.  The above assessment, treatment plan, treatment alternatives and goals were discussed and mutually agreed upon: by patient  Tonny Branch 01/30/2020, 9:05 AM

## 2020-01-30 NOTE — Progress Notes (Addendum)
[place] PHYSICAL MEDICINE & REHABILITATION PROGRESS NOTE   Subjective/Complaints: Pt was a little restless last night and indicates some confusion. Feeling well this morning.   ROS: Limited due to cognitive/behavioral    Objective:   EEG adult  Result Date: 01/28/2020 Lora Havens, MD     01/28/2020  2:00 PM Patient Name: Alison Cuevas MRN: [id number] Epilepsy Attending: Lora Havens Referring Physician/Provider: Burnetta Sabin, NP Date: 01/28/2020 Duration: 25.28 minutes Patient history: 74 year old female with left hemisphere IPH.  EEG 12 for seizures. Level of alertness: Awake, asleep AEDs during EEG study: None Technical aspects: This EEG study was done with scalp electrodes positioned according to the 10-20 International system of electrode placement. Electrical activity was acquired at a sampling rate of 500Hz  and reviewed with a high frequency filter of 70Hz  and a low frequency filter of 1Hz . EEG data were recorded continuously and digitally stored. Description: No clear posterior dominant rhythm was seen.  Sleep was characterized by vertex waves, sleep spindles (12 to 14 Hz), maximal frontocentral region.  EEG showed continuous generalized and lateralized left hemisphere 3 to 6 Hz theta-delta slowing.  Sharp waves were also seen in left frontal region. Hyperventilation and photic stimulation were not performed.   ABNORMALITY -Sharp wave, left frontal region -Continuous slow, generalized and lateralized left hemisphere IMPRESSION: This study showed evidence of potential epileptogenicity arising from left frontal region as well as cortical dysfunction in left hemisphere likely secondary to underlying hemorrhage.  Additionally there is evidence of moderate diffuse encephalopathy, nonspecific etiology.  No seizures were seen throughout the recording.  Lora Havens   Recent Labs    01/30/20 0526  WBC 7.3  HGB 13.7  HCT 40.0  PLT 185   Recent Labs    01/30/20 0526  NA 133*  K 4.0   CL 97*  CO2 23  GLUCOSE 104*  BUN 19  CREATININE 0.84  CALCIUM 9.0   No intake or output data in the 24 hours ending 01/30/20 0844   Physical Exam: Vital Signs Blood pressure (!) 146/80, pulse 99, temperature 98.7 F (37.1 C), resp. rate 18, height 5\' 2"  (1.575 m), weight 58.9 kg, last menstrual period 06/27/1972, SpO2 95 %.   General: Alert  No apparent distress HEENT: Head is normocephalic, atraumatic, PERRLA, EOMI, sclera anicteric, oral mucosa pink and moist, dentition intact, ext ear canals clear,  Neck: Supple without JVD or lymphadenopathy Heart: Reg rate and rhythm. No murmurs rubs or gallops Chest: CTA bilaterally without wheezes, rales, or rhonchi; no distress Abdomen: Soft, non-tender, non-distended, bowel sounds positive. Extremities: No clubbing, cyanosis, or edema. Pulses are 2+ Skin: Clean and intact without signs of breakdown Neuro: Normal language. Non-focal CN exam other than right central 7. Right inattention. RUE and RLE 0/5. Right heel cord tight. LUE/LLE 4-4+/5 prox to distal. Senses pain in right arm and leg.  Musculoskeletal: Full ROM, No pain with AROM or PROM in the neck, trunk, or extremities. Posture appropriate Psych: pleasant and cooperative    Assessment/Plan: 1. 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  Physiatrist is providing close team supervision and 24 hour management of active medical problems listed below.  Physiatrist and rehab team continue to assess barriers to discharge/monitor patient progress toward functional and medical goals  Care Tool:  Bathing              Bathing assist  Upper Body Dressing/Undressing Upper body dressing   What is the patient wearing?: Hospital gown only    Upper body assist Assist Level: Moderate Assistance - Patient 50 - 74%    Lower Body Dressing/Undressing Lower body  dressing      What is the patient wearing?: Incontinence brief     Lower body assist Assist for lower body dressing: Maximal Assistance - Patient 25 - 49%     Toileting Toileting    Toileting assist       Transfers Chair/bed transfer  Transfers assist           Locomotion Ambulation   Ambulation assist              Walk 10 feet activity   Assist           Walk 50 feet activity   Assist           Walk 150 feet activity   Assist           Walk 10 feet on uneven surface  activity   Assist           Wheelchair     Assist               Wheelchair 50 feet with 2 turns activity    Assist            Wheelchair 150 feet activity     Assist          Blood pressure (!) 146/80, pulse 99, temperature 98.7 F (37.1 C), resp. rate 18, height 5\' 2"  (1.575 m), weight 58.9 kg, last menstrual period 06/27/1972, SpO2 95 %.  Medical Problem List and Plan: 1. 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             -patient may shower             -ELOS/Goals: 28-32 days/Min A             -beginning CIR therapies. 2.  Antithrombotics: -DVT/anticoagulation:  Mechanical: Sequential compression devices, below knee Bilateral lower extremities             -antiplatelet therapy: None d/t hemorrhage 3. Chronic neck pain/Pain Management: Uses  Hydrocodone prn at home--will hold for now till mentation clears.              Monitor with increased exertion 4. Mood: LCSW to follow for evaluation and support.              -antipsychotic agents: N/A 5. Neuropsych: This patient is not capable of making decisions on her own behalf. 6. Skin/Wound Care: Routine pressure relief measures.  7.  Fluids/Electrolytes/Nutrition:   -I personally reviewed the patient's labs today.    -mild hyponatremia, ongoing/chronic--recheck next week  -encourage po liquids  -consider megace for appetite 8. HTN: Monitor BP tid             Verapamil and lisinopril.              fair control at present 9. Kleb pneumonia UTI: Ceftriaxone Day #3             sensitive to keflex, will convert beginning tomorrow morning.  10. Delirum: Received multiple doses of haldol on 08/02 but was brighter and  interactive on 08/03 am.   baclofen and vicodin stopped, avoid other neuro-sedating meds  -rx UTI  -sleep chart  -continue to monitor  11. Dysphagia:   -D3/thins, advance per SLP    LOS: 1 days A FACE TO FACE EVALUATION WAS PERFORMED  Meredith Staggers 01/30/2020, 8:44 AM

## 2020-01-30 NOTE — Plan of Care (Signed)
  Problem: Consults Goal: RH STROKE PATIENT EDUCATION Description: See Patient Education module for education specifics  Outcome: Progressing Goal: Nutrition Consult-if indicated Outcome: Progressing   Problem: RH BOWEL ELIMINATION Goal: RH STG MANAGE BOWEL WITH ASSISTANCE Description: STG Manage Bowel with min Assistance. Outcome: Progressing   Problem: RH BLADDER ELIMINATION Goal: RH STG MANAGE BLADDER WITH ASSISTANCE Description: STG Manage Bladder With min Assistance Outcome: Progressing   Problem: RH SAFETY Goal: RH STG ADHERE TO SAFETY PRECAUTIONS W/ASSISTANCE/DEVICE Description: STG Adhere to Safety Precautions With min Assistance/Device. Outcome: Progressing Goal: RH STG DECREASED RISK OF FALL WITH ASSISTANCE Description: STG Decreased Risk of Fall With min Assistance. Outcome: Progressing   Problem: RH COGNITION-NURSING Goal: RH STG USES MEMORY AIDS/STRATEGIES W/ASSIST TO PROBLEM SOLVE Description: STG Uses Memory Aids/Strategies With min Assistance to Problem Solve. Outcome: Progressing Goal: RH STG ANTICIPATES NEEDS/CALLS FOR ASSIST W/ASSIST/CUES Description: STG Anticipates Needs/Calls for Assist With min Assistance/Cues. Outcome: Progressing   Problem: RH PAIN MANAGEMENT Goal: RH STG PAIN MANAGED AT OR BELOW PT'S PAIN GOAL Description: Less than 3 Outcome: Progressing   Problem: RH KNOWLEDGE DEFICIT Goal: RH STG INCREASE KNOWLEDGE OF HYPERTENSION Description: Pt will manage with min assist using handouts and booklets Outcome: Progressing Goal: RH STG INCREASE KNOWLEDGE OF DYSPHAGIA/FLUID INTAKE Description: Pt will manage with min assist Outcome: Progressing Goal: RH STG INCREASE KNOWLEDGE OF STROKE PROPHYLAXIS Description: Pt will manage with min assist Outcome: Progressing

## 2020-01-30 NOTE — Progress Notes (Signed)
Physical Therapy Session Note  Patient Details  Name: Alison Cuevas MRN: [id number] Date of Birth: [birth year]  Today's Date: 01/30/2020 PT Individual Time: 9169-4503 PT Individual Time Calculation (min): 27 min   Short Term Goals: Week 1:  PT Short Term Goal 1 (Week 1): Patient will perform bed mobility with mod A. PT Short Term Goal 2 (Week 1): Patient will perform basic transfers with mod A using LRAD. PT Short Term Goal 3 (Week 1): Patient will initate gait training. PT Short Term Goal 4 (Week 1): Patient will perform standing activities >5 min focused on weight bearing for R LE tone management.  Skilled Therapeutic Interventions/Progress Updates:     Patient in bed with her daughter at bedside at beginning of session. Patient reporting increased fatigued at this time. Noted that patient had a visitor from the [place] prior to PT evaluation and another visit from another member of the [facility] following PT eval this morning. Now visiting with her daughter. Asked patient if she had rested between session and she stated that she had not. Educated patient and her daughter about importance of quiet rest between therapy sessions due to intensity of rehab schedule and current stage of recovery. Patient and family in agreement to limit visitations to 1 person a day at this time to allow patient to recover between therapy session.   Patient's daughter with questions about R UE soft splint and PRAFOs, stating the patient is not comfortable in them. Patient stated that they are "torchure devices." Educated patient and her daughter about purpose and benefits of these devices. Donned R PRAFO with R knee flexed. Unable to properly position her R foot in the [facility] due to extensor tone. Performed R DF stretch 2x2 min with minimal change in DF ROM. Attempted to don [facility] again with minimal improvement. Educated on setting a wearing schedule for [facility] and initiating a search for an improved night splint due to  significant PF with inversion of R ankle secondary to extensor tone. RN and MD made aware.   Also discussed d/c planning with patient's daughter. She states that she can provide 24/7 assist at d/c for a few months, then she will need to return to work. Discussed POC, PT goals, and patient's current level of function. Also, educated on stroke symptoms, including spasticity and R inattention, and recovery.  Patient in bed with her daughter at bedside with breaks locked, bed alarm set, and all needs in reach.   Therapy Documentation Precautions:  Precautions Precautions: Fall Precaution Comments: Increased tone RUE (flexion)/RLE(extension) Required Braces or Orthoses: Cervical Brace Cervical Brace: Soft collar, For comfort Restrictions Weight Bearing Restrictions: No    Therapy/Group: Individual Therapy  Emanie Behan L Dominga Mcduffie PT, DPT  01/30/2020, 4:15 PM

## 2020-01-30 NOTE — Progress Notes (Signed)
Inpatient Rehabilitation  Patient information reviewed and entered into eRehab system by Rosalyn Archambault M. Ryane Canavan, M.A., CCC/SLP, PPS Coordinator.  Information including medical coding, functional ability and quality indicators will be reviewed and updated through discharge.    

## 2020-01-30 NOTE — Progress Notes (Signed)
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 Allergic rhinitis    maple pollen  . Anxiety   . Arthritis   . Bowel obstruction ([place])   . Chronic low back pain    Dr. Trenton Gammon.  Has had back injection--bp elevated after.  . Chronic pain syndrome   . Chronic renal insufficiency, stage 2 (mild)    GFR 60-70  . DDD (degenerative disc disease), cervical    Hx of ACDF (Dr. [name]).  Followed by Dr. [name].  Also, Dr. [name] do left C7-T1 intralaminal epidural injection.  . Diverticulosis of colon   . DJD (degenerative joint disease)   . Gallstones   . GERD (gastroesophageal reflux disease)   . Herpes zoster 07/08/2014  . Hypercholesteremia    mild; pt declined statin trial 05/2014--needs recheck lipid panel at first f/u visit in 2017  . Hypertension   . Osteopenia 06/2017   T score -1.4 FRAX 15% / 2%  . Ovarian cyst 2017   82019-robotic assisted bilatel SPO: all path benign.  . Perianal dermatitis    prn cutivate  . Phlebitis   . Right knee injury 2018   Patellofemoral crush injury--Dr. [name].  . Trochanteric bursitis of right hip    Recurrent (injection by Dr. [name] 05/26/15)  . Tympanic membrane rupture, right 12/01/2016   As of 08/2018 pt set for tympanomastoidectomy and STSG (Dr. [name]). Recurrent fungal/bact OE.  . Varicose vein    left leg    Past Surgical History:  Past Surgical History:  Procedure Laterality Date  . ABDOMINAL HYSTERECTOMY  03/2015   TAH.  Last pap 2016.  No hx of abnl paps.  Per GYN, no further pap smears indicated.  . ANTERIOR AND POSTERIOR REPAIR N/A 03/18/2014   Procedure: Cystocele repair with graft, Vault suspension, Rectocele repair;  Surgeon: [name], MD;  Location: [facility];   Service: Urology;  Laterality: N/A;  . CHOLECYSTECTOMY    . CHOLECYSTECTOMY, LAPAROSCOPIC    . COLON SURGERY    . COLONOSCOPY  04/2006; 05/26/16   2007 (Dr. [name]): Normal.  04/2016 (Dr. [name]) normal except diverticulosis and decreased anal sphincter tone.  No repeat colonoscopy is recommended due to age.  . cspine surgery     Dr. Wiliam Ke level ant cerv discectomy /fusion w/plating  . CYSTO N/A 03/18/2014   Procedure: CYSTO;  Surgeon: [name], MD;  Location: [facility];  Service: Urology;  Laterality: N/A;  . DEXA  07/30/2015; 06/2018   2017 and 2020 -->Osteopenia--repeat 2 yrs.  Marland Kitchen LYSIS OF ADHESION N/A 01/31/2018   Procedure: POSSIBLE LYSIS OF ADHESION;  Surgeon: Everitt Amber, MD;  Location: [facility];  Service: Gynecology;  Laterality: N/A;  . Bessie     with prolasped bledder repair  . RESECTION OF COLON     BENIGN TUMOR  . RIGHT EAR SURGERY  08/23/2017   Dr. 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 right ear surgery  12/12/2018   Right tympanomastoidectomy, ossiculoplasty with partial prosthesis, split thickness skin graft from postauricular skin 1x1cm, and right tragal cartilage graft 1x1 cm [facility])  . right hemicolectomy for diverticulitis with abscess  1993  .  ROBOTIC ASSISTED BILATERAL SALPINGO OOPHERECTOMY Bilateral 01/31/2018   All PATH benign.  Procedure: XI ROBOTIC ASSISTED BILATERAL SALPINGO OOPHORECTOMY;  Surgeon: Everitt Amber, MD;  Location: [facility];  Service: Gynecology;  Laterality: Bilateral;  . TUBAL LIGATION     Social History:  reports that she has never smoked. She has never used smokeless tobacco. She reports that she does not drink alcohol and does not use drugs.  Family / Support Systems Marital Status: Married Patient Roles: Spouse, Parent Children: 1 daughter and 2 sons Anticipated Caregiver: daughter; Ed Blalock Ability/Limitations of Caregiver: Daughter currently without work and can provide 24/7 care.  Dtr needs insurance so may need to go back to work by end of this year. Caregiver Availability: 24/7  Social History Preferred language: English Religion: Holiness Cultural Background: Architectural technologist Education: High school (9th grade) Read: Yes Write: Yes Employment Status: Retired   Abuse/Neglect Abuse/Neglect Assessment Can Be Completed: Yes Physical Abuse: Denies Verbal Abuse: Denies Sexual Abuse: Denies Exploitation of patient/patient's resources: Denies Self-Neglect: Denies  Emotional Status Pt's affect, behavior and adjustment status: Restricted affect, normal mood and behavior Psychiatric History: Anxiety  Patient / Family Perceptions, Expectations & Goals Pt/Family understanding of illness & functional limitations: Daughter has a fair understanding of the current situation and functional limitations Premorbid pt/family roles/activities: Patient independent PTA and helping care for spouse with medical issues Anticipated changes in roles/activities/participation: Anticipate children will need to assist with care at discharge Pt/family expectations/goals: Patient would like to be able to get back to her usual routine and be able to get out and feed the animals in the yard  [employer]: Other (Comment) Alvis Lemmings) Premorbid Home Care/DME Agencies: Other (Comment) (RW, shower seat SPC and rollator) Transportation available at discharge: Children will provide transportation at discharge  Discharge Planning Living Arrangements: Spouse/significant other [place]: Children Type of Residence: Private residence Insurance Resources: Medicare Financial Screen Referred: No Money Management: Patient Does the patient have any problems obtaining your medications?: No Home Management: Family will manage the home Patient/Family Preliminary Plans:  Discharge home with spouse and daughter coming in to assist Care Coordinator Barriers to Discharge: Home environment access/layout Care Coordinator Barriers to Discharge Comments: 2 level home ith 3 step entry and bil rails Care Coordinator Anticipated Follow Up Needs: HH/OP Expected length of stay: 3-4 weeks  Clinical Impression The patient is somewhat confused, reports memory is bad since the stroke, reports thinking is not clear since the stroke and requests extra time to "collect thoughts". Daughter concerned that the patient was on several blood pressure medications and was taking them as ordered at the time of the stroke. Daughter reports the patient has been under stress recently taking care of her husband. Daughter reports good family support and previously worked with Biochemist, clinical at [employer].  Dorien Chihuahua B 01/30/2020, 4:17 PM

## 2020-01-31 ENCOUNTER — Inpatient Hospital Stay (HOSPITAL_COMMUNITY): Payer: Medicare Other | Admitting: Speech Pathology

## 2020-01-31 ENCOUNTER — Inpatient Hospital Stay (HOSPITAL_COMMUNITY): Payer: Medicare Other | Admitting: Physical Therapy

## 2020-01-31 ENCOUNTER — Inpatient Hospital Stay (HOSPITAL_COMMUNITY): Payer: Medicare Other

## 2020-01-31 ENCOUNTER — Inpatient Hospital Stay (HOSPITAL_COMMUNITY): Payer: Medicare Other | Admitting: Occupational Therapy

## 2020-01-31 MED ORDER — QUETIAPINE 12.5 MG HALF TABLET
12.0000 mg | ORAL_TABLET | Freq: Every day | ORAL | Status: DC
  Administered 2020-01-31 – 2020-02-19 (×20): 12.5 mg via ORAL
  Filled 2020-01-31 (×21): qty 1

## 2020-01-31 MED ORDER — SODIUM CHLORIDE 0.9 % IV SOLN: INTRAVENOUS | Status: DC

## 2020-01-31 NOTE — Progress Notes (Signed)
Speech Language Pathology Daily Session Note  Patient Details  Name: Anshu Wehner MRN: [id number] Date of Birth: 24-Aug-1945  Today's Date: 01/31/2020 SLP Individual Time: 1330-1400 SLP Individual Time Calculation (min): 30 min  Short Term Goals: Week 1: SLP Short Term Goal 1 (Week 1): Pt will consume trials of regular textures demonstrating efficient mastication and oral clearance across 2 sessions with no more than min A cues for oral clearance of residue. SLP Short Term Goal 2 (Week 1): Pt will sustain attention to functional tasks for 5 min with mod A verbal cues for redirection. SLP Short Term Goal 3 (Week 1): Pt will demonstrate recall of how to use call bell for assistance with Max A verbal cues. SLP Short Term Goal 4 (Week 1): Pt will demonstrate ability to problem solve basic familiar tasks with Max A verbal cues.  Skilled Therapeutic Interventions:   Patient seen to address speech-language and cognitive goals. Patient was oriented to the fact that she was in the hospital, stating she was at [facility] (she was initially there following her code stroke), she recalled and described accurately the events surrounding her CVA and correctly stated that the month was August. She was in process of eating lunch with NT when SLP arrived. Patient required moderate frequency of cues to direct and redirect attention and to functionally perform task of picking food up on fork. She was heavily leaning to the left and exhibited neglect of right side. Patient did show some awareness to her deficits saying "I dont think I can" when asked if she was able to move her right arm. She was not able to effectively manage self-feeding, but in addition she seemed distracted by SLP being present, saying, "Ill eat when you leave". Patient continues to benefit from skilled speech-language, cognitive and swallowing therapy.  Pain Pain Assessment Pain Scale: 0-10 Pain Score: 0-No pain  Therapy/Group: Individual  Therapy  Sonia Baller, MA, CCC-SLP 01/31/20 3:47 PM

## 2020-01-31 NOTE — Progress Notes (Signed)
Pt assisted to bathroom with Therapy and STEDY lift, unable to void. Straight cath done with 450 of clear yellow urine out.

## 2020-01-31 NOTE — Progress Notes (Signed)
Pt arrived to floor by bed. Assessment completed, skin assessment completed with Maggie Schwalbe. Pt and family oriented to floor and Therapy routine.

## 2020-01-31 NOTE — Progress Notes (Signed)
Occupational Therapy Session Note  Patient Details  Name: Alison Cuevas MRN: [id number] Date of Birth: Jun 10, 1946  Today's Date: 01/31/2020 OT Individual Time: 1224-4975 OT Individual Time Calculation (min): 74 min    Short Term Goals: Week 1:  OT Short Term Goal 1 (Week 1): Pt will recall hemi dressing techniques with min VC OT Short Term Goal 2 (Week 1): Pt will visually attend to R arm during bathing tasks to demo  improve R inattention OT Short Term Goal 3 (Week 1): Pt will transfer to [facility] with MAX A of 1 caregiver to decrease BOC for toileting OT Short Term Goal 4 (Week 1): Pt will locate 2/2 grooming items on R of sink for improved R inattention  Skilled Therapeutic Interventions/Progress Updates:  Patient met lying supine in bed in agreement with OT treatment session. Patient A&O x2 (name/D.O.B and situation only). Patient not oriented to place or Month or year but is able to recall day of week. Patient reporting pain bilateral arms and back but unable to quantify or describe. Patient reporting need for BM requiring Max A to Total A for rolling R<>L in supine as patient requires +2 assist for OOB activity. UB bathing/dressing with Max A at bed level and cues for attention to R, sequencing, and hemi-dressing technique. RN present to assist with bed mobility and sit to stand transfers from EOB <> BSC over toilet in bathroom requiring Max A +2. Multimodal cueing for orientation to midline, R attention, and attention to R visual field required throughout session. Session concluded with patient lying supine in bed with call bell within reach, bed alarm activated, and all needs met.   Therapy Documentation Precautions:  Precautions Precautions: Fall Precaution Comments: Increased tone RUE (flexion)/RLE(extension) Required Braces or Orthoses: Cervical Brace Cervical Brace: Soft collar, For comfort Restrictions Weight Bearing Restrictions: No General:    Therapy/Group: Individual  Therapy  Akiko Schexnider R [place] 01/31/2020, 9:11 AM

## 2020-01-31 NOTE — Progress Notes (Signed)
Physical Therapy Session Note  Patient Details  Name: Alison Cuevas MRN: [id number] Date of Birth: 08/07/1945  Today's Date: 01/31/2020 PT Individual Time: 1100-1200 PT Individual Time Calculation (min): 60 min   Short Term Goals: Week 1:  PT Short Term Goal 1 (Week 1): Patient will perform bed mobility with mod A. PT Short Term Goal 2 (Week 1): Patient will perform basic transfers with mod A using LRAD. PT Short Term Goal 3 (Week 1): Patient will initate gait training. PT Short Term Goal 4 (Week 1): Patient will perform standing activities >5 min focused on weight bearing for R LE tone management.  Skilled Therapeutic Interventions/Progress Updates:    Pt received seated in bed having just returned from CT scan. Per RN ok to work with patient for therapy session. Pt reports ongoing muscular pain in her whole body and nerve pain in RUE, not rated and declines intervention. Orthotist arrives during session and assists pt with donning RLE night splint. Pt requires assist to flex R knee in order to break up PF tone so that brace can be donned appropriately. Per orthotist pt can wear brace for transfers but not supportive enough for gait. Pt agreeable to get up to w/c this session. Semi-reclined in bed to sitting EOB with total A for UE/LE and trunk management. Pt initially requires max A for sitting balance with pushing to the R. With use of LUE on bedrail pt able to maintain sitting balance with close Supervision at times. Sit to stand with max A to stedy from elevated bed. Pt requires assist for trunk control while in standing due to heavy pushing to the R. Stedy transfer to w/c. Obtained smaller size w/c (16x16) for improved fit for patient. Once seated in w/c adjusted leg rests and built up L leg rest for improved patient fit and positioning. Pt then reports feeling "swimmy-headed" and "sick to my stomach". Seated BP 111/64, HR 98, SpO2 97%. Pt reports symptoms resolve while seated in w/c. Pt left  seated in w/c in room with needs in reach, quick release belt and chair alarm in place, R lap tray in place.  Therapy Documentation Precautions:  Precautions Precautions: Fall Precaution Comments: Increased tone RUE (flexion)/RLE(extension) Required Braces or Orthoses: Cervical Brace Cervical Brace: Soft collar, For comfort Restrictions Weight Bearing Restrictions: No    Therapy/Group: Individual Therapy   Excell Seltzer, PT, DPT  01/31/2020, 12:19 PM

## 2020-01-31 NOTE — Progress Notes (Addendum)
[place] PHYSICAL MEDICINE & REHABILITATION PROGRESS NOTE   Subjective/Complaints: Pt herself reports confusion, restlessness last night. RN concerned about more confusion and right sided weakness  ROS: Limited due to cognitive/behavioral    Objective:   VAS [place] LOWER EXTREMITY VENOUS (DVT)  Result Date: 01/30/2020  Lower Venous DVTStudy Indications: Stroke, and hemiplegia, immobility.  Limitations: Position. Comparison Study: No prior study Performing Technologist: Maudry Mayhew MHA, RDMS, RVT, RDCS  Examination Guidelines: A complete evaluation includes B-mode imaging, spectral Doppler, color Doppler, and power Doppler as needed of all accessible portions of each vessel. Bilateral testing is considered an integral part of a complete examination. Limited examinations for reoccurring indications may be performed as noted. The reflux portion of the exam is performed with the patient in reverse Trendelenburg.  +---------+---------------+---------+-----------+----------+--------------+ RIGHT    CompressibilityPhasicitySpontaneityPropertiesThrombus Aging +---------+---------------+---------+-----------+----------+--------------+ CFV      Full           Yes      Yes                                 +---------+---------------+---------+-----------+----------+--------------+ SFJ      Full                                                        +---------+---------------+---------+-----------+----------+--------------+ FV Prox  Full                                                        +---------+---------------+---------+-----------+----------+--------------+ FV Mid   Full                                                        +---------+---------------+---------+-----------+----------+--------------+ FV DistalFull                                                        +---------+---------------+---------+-----------+----------+--------------+ PFV      Full                                                         +---------+---------------+---------+-----------+----------+--------------+ POP      Full           Yes      Yes                                 +---------+---------------+---------+-----------+----------+--------------+ PTV      Full                                                        +---------+---------------+---------+-----------+----------+--------------+  PERO     Full                                                        +---------+---------------+---------+-----------+----------+--------------+   +---------+---------------+---------+-----------+----------+--------------+ LEFT     CompressibilityPhasicitySpontaneityPropertiesThrombus Aging +---------+---------------+---------+-----------+----------+--------------+ CFV      Full           Yes      Yes                                 +---------+---------------+---------+-----------+----------+--------------+ SFJ      Full                                                        +---------+---------------+---------+-----------+----------+--------------+ FV Prox  Full                                                        +---------+---------------+---------+-----------+----------+--------------+ FV Mid   Full                                                        +---------+---------------+---------+-----------+----------+--------------+ FV DistalFull                                                        +---------+---------------+---------+-----------+----------+--------------+ PFV      Full                                                        +---------+---------------+---------+-----------+----------+--------------+ POP      Full           Yes      Yes                                 +---------+---------------+---------+-----------+----------+--------------+ PTV      Full                                                         +---------+---------------+---------+-----------+----------+--------------+ PERO     Full                                                        +---------+---------------+---------+-----------+----------+--------------+  Summary: RIGHT: - There is no evidence of deep vein thrombosis in the lower extremity.  - No cystic structure found in the popliteal fossa.  LEFT: - There is no evidence of deep vein thrombosis in the lower extremity.  - No cystic structure found in the popliteal fossa.  *See table(s) above for measurements and observations.    Preliminary    Recent Labs    01/30/20 0526  WBC 7.3  HGB 13.7  HCT 40.0  PLT 185   Recent Labs    01/30/20 0526  NA 133*  K 4.0  CL 97*  CO2 23  GLUCOSE 104*  BUN 19  CREATININE 0.84  CALCIUM 9.0    Intake/Output Summary (Last 24 hours) at 01/31/2020 0953 Last data filed at 01/31/2020 0309 Gross per 24 hour  Intake 395 ml  Output 500 ml  Net -105 ml     Physical Exam: Vital Signs Blood pressure (!) 148/103, pulse 98, temperature 98.1 F (36.7 C), temperature source Oral, resp. rate 16, height 5\' 2"  (1.575 m), weight 60.3 kg, last menstrual period 06/27/1972, SpO2 97 %.   Constitutional: No distress . Vital signs reviewed. HEENT: EOMI, oral membranes moist Neck: supple Cardiovascular: RRR without murmur. No JVD    Respiratory/Chest: CTA Bilaterally without wheezes or rales. Normal effort    GI/Abdomen: BS +, non-tender, non-distended Ext: no clubbing, cyanosis, or edema Psych: pleasant but confused. Skin: Clean and intact without signs of breakdown Neuro: Oriented to name and hospital with cues. Normal language. Non-focal CN exam other than right central 7. Right inattention. RUE and RLE 0/5. LUE/LLE 4-4+/5 prox to distal. Senses pain in right arm and leg.  Musculoskeletal: Full ROM, No pain with AROM or PROM in the neck, trunk, or extremities. 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        Assessment/Plan: 1. 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  Physiatrist is providing close team supervision and 24 hour management of active medical problems listed below.  Physiatrist and rehab team continue to assess barriers to discharge/monitor patient progress toward functional and medical goals  Care Tool:  Bathing    Body parts bathed by patient: Chest, Abdomen, Front perineal area, Right upper leg, Face   Body parts bathed by helper: Right arm, Left arm, Buttocks, Left upper leg, Right lower leg, Left lower leg     Bathing assist Assist Level: Maximal Assistance - Patient 24 - 49%     Upper Body Dressing/Undressing Upper body dressing   What is the patient wearing?: Pull over shirt    Upper body assist Assist Level: Total Assistance - Patient < 25%    Lower Body Dressing/Undressing Lower body dressing      What is the patient wearing?: Incontinence brief, Pants     Lower body assist Assist for lower body dressing: Total Assistance - Patient < 25%     Toileting Toileting    Toileting assist Assist for toileting: 2 Helpers     Transfers Chair/bed transfer  Transfers assist     Chair/bed transfer assist level: Maximal Assistance - Patient 25 - 49% (with total A for R foot placement in stand pivot)     Locomotion Ambulation   Ambulation assist   Ambulation activity did not occur: Safety/medical concerns (decreased motor control, R hemi)          Walk 10 feet activity   Assist  Walk 10 feet activity did not occur: Safety/medical concerns        Walk 50 feet activity   Assist Walk 50 feet with 2 turns activity did not occur: Safety/medical concerns         Walk 150 feet activity   Assist Walk 150 feet activity did not occur: Safety/medical concerns         Walk 10 feet on uneven surface   activity   Assist Walk 10 feet on uneven surfaces activity did not occur: Safety/medical concerns         Wheelchair     Assist     Wheelchair activity did not occur: Safety/medical concerns (unable with and without skilled intervention due to poor motor planning and attention)         Wheelchair 50 feet with 2 turns activity    Assist    Wheelchair 50 feet with 2 turns activity did not occur: Safety/medical concerns       Wheelchair 150 feet activity     Assist  Wheelchair 150 feet activity did not occur: Safety/medical concerns       Blood pressure (!) 148/103, pulse 98, temperature 98.1 F (36.7 C), temperature source Oral, resp. rate 16, height 5\' 2"  (1.575 m), weight 60.3 kg, last menstrual period 06/27/1972, SpO2 97 %.  Medical Problem List and Plan: 1. 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             -patient may shower             -ELOS/Goals: 28-32 days/Min A             -continue CIR therapies with PT, OT, SLP  -will try an adjustable tension night-splint/dorsi-flexion splint to address right heel cord contracture. Spoke with Gerald Stabs at [facility] today. It has a modest sole which could be used for wb/transfers 2.  Antithrombotics: -DVT/anticoagulation:  Mechanical: Sequential compression devices, below knee Bilateral lower extremities             -antiplatelet therapy: None d/t hemorrhage 3. Chronic neck pain/Pain Management: Uses  Hydrocodone prn at home--will hold for now till mentation clears.              Monitor with increased exertion 4. Mood: LCSW to follow for evaluation and support.              -antipsychotic agents: N/A 5. Neuropsych: This patient is not capable of making decisions on her own  behalf. 6. Skin/Wound Care: Routine pressure relief measures.  7. Fluids/Electrolytes/Nutrition:   -mild hyponatremia, ongoing/chronic--recheck Monday  -encourage po liquids  -consider megace for appetite, but ate a little better yesterday  -pt was -105 cc yesterday. Will restart  NS IVF at 50cc/hr continuously for now until she perks up a bit 8. HTN: Monitor BP tid             Verapamil and lisinopril.              reasonable control until this morning's reading. Continue to monitor for pattern 9. Kleb pneumonia UTI: ceftriaxone x3 days and now on keflex to complete 7 day course                10. Delirum/AMS:  Seems worse since admission to rehab.   -  baclofen and vicodin already stopped, avoid other neuro-sedating meds  -poor sleep patterns, increased confusion at HS  -check head CT    -continue to rx UTI  -continue sleep chart  -recent labs stable   -will add low dose seroquel 12.5mg  at 2000 nightly 11. Dysphagia:   -D3/thins, advance per SLP    LOS: 2 days A FACE TO FACE EVALUATION WAS PERFORMED  Meredith Staggers 01/31/2020, 9:53 AM

## 2020-01-31 NOTE — Progress Notes (Signed)
Pt has been anxious, restless and confused through out the night. Pt has had minimal to no sleep, continues to yell for help and call staff to the room. Pt states she is alone, doesn't know why she is here, and would like to leave. pt does not know where she is, and is only oriented to self. Pt denies being in pain, no signs of distress at this time.

## 2020-01-31 NOTE — Progress Notes (Signed)
Orthopedic Tech Progress Note Patient Details:  Alison Cuevas 03/23/1946 [id number] Called in order to [facility] for a NIGHT SPLINT Patient ID: [name], female   DOB: [birth year], 74 y.o.   MRN: [id number]   [name] 01/31/2020, 11:33 AM

## 2020-01-31 NOTE — IPOC Note (Signed)
Overall Plan of Care [facility]) Patient Details Name: Alison Cuevas MRN: [id number] DOB: 1946-06-02  Admitting Diagnosis: ICH (intracerebral hemorrhage) [facility])  Hospital Problems: Principal Problem:   ICH (intracerebral hemorrhage) ([place]) - L frontoparietal, hypertensive     Functional Problem List: Nursing Bladder, Bowel, Nutrition  PT Balance, Safety, Behavior, Sensory, Edema, Skin Integrity, Endurance, Motor, Nutrition, Pain, Perception  OT Balance, Cognition, Endurance, Motor, Pain, Perception, Safety, Sensory  SLP Cognition  TR         Basic ADL's: OT Grooming, Bathing, Dressing, Toileting     Advanced  ADL's: OT Simple Meal Preparation     Transfers: PT Bed Mobility, Bed to Chair, Car, Furniture, Floor  OT Toilet, Tub/Shower     Locomotion: PT Ambulation, Emergency planning/management officer, Stairs     Additional Impairments: OT Fuctional Use of Upper Extremity  SLP Swallowing      TR      Anticipated Outcomes Item Anticipated Outcome  Self Feeding S  Swallowing  Supervision   Basic self-care  S  Toileting  MIN A   Bathroom Transfers MIN A  Bowel/Bladder   (straight cath)  Transfers  min A using LRAD  Locomotion  mod A using LRAD  Communication     Cognition  Minimum Assistance.  Pain  pain goal less than 3  Safety/Judgment  min assist   Therapy Plan: PT Intensity: Minimum of 1-2 x/day ,45 to 90 minutes PT Frequency: 5 out of 7 days PT Duration Estimated Length of Stay: 28-32 days OT Intensity: Minimum of 1-2 x/day, 45 to 90 minutes OT Frequency: 5 out of 7 days OT Duration/Estimated Length of Stay: 3-4 weeks SLP Intensity: Minumum of 1-2 x/day, 30 to 90 minutes SLP Frequency: 3 to 5 out of 7 days SLP Duration/Estimated Length of Stay: 3 to 4 weeks.   Due to the current state of emergency, patients may not be receiving their 3-hours of Medicare-mandated therapy.   Team Interventions: Nursing Interventions Patient/Family Education  PT interventions  Ambulation/gait training, Community reintegration, DME/adaptive equipment instruction, Neuromuscular re-education, Psychosocial support, Stair training, UE/LE Strength taining/ROM, Wheelchair propulsion/positioning, Training and development officer, Discharge planning, Functional electrical stimulation, Pain management, Skin care/wound management, Therapeutic Activities, UE/LE Coordination activities, Visual/perceptual remediation/compensation, Therapeutic Exercise, Splinting/orthotics, Patient/family education, Functional mobility training, Disease management/prevention, Cognitive remediation/compensation  OT Interventions Training and development officer, DME/adaptive equipment instruction, Patient/family education, Therapeutic Activities, Wheelchair propulsion/positioning, Cognitive remediation/compensation, Functional electrical stimulation, Psychosocial support, Therapeutic Exercise, UE/LE Strength taining/ROM, Self Care/advanced ADL retraining, Functional mobility training, Community reintegration, Discharge planning, Neuromuscular re-education, Skin care/wound managment, UE/LE Coordination activities, Visual/perceptual remediation/compensation, Splinting/orthotics, Pain management, Disease mangement/prevention  SLP Interventions Cognitive remediation/compensation, Dysphagia/aspiration precaution training, Functional tasks, Patient/family education  TR Interventions    SW/CM Interventions Discharge Planning, Disease Management/Prevention, Psychosocial Support, Patient/Family Education   Barriers to Discharge MD  Medical stability  Nursing Inaccessible home environment, Decreased caregiver support, Lack of/limited family support, Home environment access/layout    PT Inaccessible home environment, Decreased caregiver support, Home environment access/layout, Incontinence, Lack of/limited family support, Behavior Patient has STE her home, her husband is unable to provide physical assistance at d/c, unsure of  other family assistance avaialabe. Patient with significant cognitive and mobility deficits and new incontinence.  OT Inaccessible home environment, Decreased caregiver support, Home environment access/layout, Lack of/limited family support husband unable to provide physical assist at d/c  SLP      SW Home environment access/layout 2 level home ith 3 step entry and bil rails   Team Discharge Planning: Destination: PT-Home (may need SNF placement  if not other caregivers available) ,OT- Home (v SNF) , SLP-Home Projected Follow-up: PT-Home health PT, Skilled nursing facility (will determine based on caregiver support at d/c.), OT-  Home health OT, SLP-24 hour supervision/assistance, Outpatient SLP, Home Health SLP Projected Equipment Needs: PT-To be determined, OT- 3 in 1 bedside comode, Tub/shower seat, SLP-None recommended by SLP Equipment Details: PT-Will determine DME needs based on patient's progress, OT-  Patient/family involved in discharge planning: PT- Patient,  OT-Patient, SLP-Patient  MD ELOS: 23-30 days Medical Rehab Prognosis:  Excellent Assessment: The patient has been admitted for CIR therapies with the diagnosis of left fronto-parietal hemorrhage. The team will be addressing functional mobility, strength, stamina, balance, safety, adaptive techniques and equipment, self-care, bowel and bladder mgt, patient and caregiver education, NMR, cognition, swallowing, sleep-wake restoration, community reintegration. Goals have been set at superviison to min assist with self-care, min to mod assist with mobility and min assist with cognition.   Due to the current state of emergency, patients may not be receiving their 3 hours per day of Medicare-mandated therapy.    Meredith Staggers, MD, FAAPMR      See Team Conference Notes for weekly updates to the plan of care

## 2020-01-31 NOTE — Progress Notes (Signed)
Pt appears increasingly altered since arrival to unit 2 days ago. Pt showing signs of R neglect, unable to distinguish b/w her right and left hands. When asking the pt to look towards the door (her right) pt continues to look to the left while attempting to get out of the bed. Pt also unable to recall staff faces and names. On pts arrival to the unit on 01/29/20 pt was able to recognize this nurse and recall conversations that we had. 01/31/20 pt is unable to recall any previous events, states " I think I am in a bank and need help getting home for my appointments".

## 2020-01-31 NOTE — Progress Notes (Signed)
Occupational Therapy Session Note  Patient Details  Name: Alison Cuevas MRN: [id number] Date of Birth: 10/04/1945  Today's Date: 01/31/2020 OT Individual Time: 1500-1600 OT Individual Time Calculation (min): 60 min    Short Term Goals: Week 1:  OT Short Term Goal 1 (Week 1): Pt will recall hemi dressing techniques with min VC OT Short Term Goal 2 (Week 1): Pt will visually attend to R arm during bathing tasks to demo  improve R inattention OT Short Term Goal 3 (Week 1): Pt will transfer to [facility] with MAX A of 1 caregiver to decrease BOC for toileting OT Short Term Goal 4 (Week 1): Pt will locate 2/2 grooming items on R of sink for improved R inattention  Skilled Therapeutic Interventions/Progress Updates:    1;1. Pt received in w/c with no c/o pain but endorses dizziness, Vitals assessed 130/77. Pt completes seated dynavision for R attention/visual scanning to locate stimuli in botton quadrants 2x2 min trials with max multimodal cuing at first trial to grasp concept. Second trial requires 3.0 second to locate stimuli on L and 6.2 seconds on R quadrant. Pt educated on importance of visual scanning and impacts of R neglect. Pt completes seated reaching task in w/c reaching with LUE in max ranges outside BOS with CGA to grab block and place it on far R table. Pt unable to turn head on to R therefore follows eye sight along OT arm pointint to box to place block into container. Pt very frustrated intermittently stating, "This shouldn't be this hard." OT provides education reassurance, and orientation to pt for place and situation. Exited session with pt seated in bed, exit alarm on and call light in reach  Therapy Documentation Precautions:  Precautions Precautions: Fall Precaution Comments: Increased tone RUE (flexion)/RLE(extension) Required Braces or Orthoses: Cervical Brace Cervical Brace: Soft collar, For comfort Restrictions Weight Bearing Restrictions: No General:   Vital Signs: Therapy  Vitals Temp: 99.6 F (37.6 C) Temp Source: Oral Pulse Rate: 76 Resp: 18 BP: 130/61 Patient Position (if appropriate): Lying Oxygen Therapy SpO2: 98 % O2 Device: Room Air Pain: Pain Assessment Pain Scale: 0-10 Pain Score: 0-No pain ADL: ADL Upper Body Bathing: Maximal cueing, Moderate assistance Where Assessed-Upper Body Bathing: Wheelchair, Sitting at sink Lower Body Bathing: Maximal assistance, Maximal cueing Where Assessed-Lower Body Bathing: Bed level Upper Body Dressing: Maximal assistance, Maximal cueing Where Assessed-Upper Body Dressing: Sitting at sink, Wheelchair Lower Body Dressing: Dependent Where Assessed-Lower Body Dressing: Bed level (able to bridge hips) Toilet Transfer: Dependent (stedy) Vision   Perception    Praxis   Exercises:   Other Treatments:     Therapy/Group: Individual Therapy  Tonny Branch 01/31/2020, 4:15 PM

## 2020-01-31 NOTE — Progress Notes (Signed)
MD updated on pt increased confusion and r side deficit. States will be in to evaluate pt

## 2020-02-01 ENCOUNTER — Inpatient Hospital Stay (HOSPITAL_COMMUNITY): Payer: Medicare Other | Admitting: Occupational Therapy

## 2020-02-01 ENCOUNTER — Inpatient Hospital Stay (HOSPITAL_COMMUNITY): Payer: Medicare Other | Admitting: Physical Therapy

## 2020-02-01 ENCOUNTER — Inpatient Hospital Stay (HOSPITAL_COMMUNITY): Payer: Medicare Other | Admitting: Speech Pathology

## 2020-02-01 DIAGNOSIS — I610 Nontraumatic intracerebral hemorrhage in hemisphere, subcortical: Secondary | ICD-10-CM

## 2020-02-01 LAB — HEMOGLOBIN AND HEMATOCRIT, BLOOD
HCT: 38.8 % (ref 36.0–46.0)
Hemoglobin: 13.7 g/dL (ref 12.0–15.0)

## 2020-02-01 MED ORDER — SENNOSIDES-DOCUSATE SODIUM 8.6-50 MG PO TABS
2.0000 | ORAL_TABLET | Freq: Two times a day (BID) | ORAL | Status: DC
  Administered 2020-02-01 – 2020-02-18 (×20): 2 via ORAL
  Filled 2020-02-01 (×38): qty 2

## 2020-02-01 NOTE — Progress Notes (Signed)
Speech Language Pathology Daily Session Note  Patient Details  Name: Alison Cuevas MRN: [id number] Date of Birth: 06-Dec-1945  Today's Date: 02/01/2020 SLP Individual Time: 1335-1400 SLP Individual Time Calculation (min): 25 min  Short Term Goals: Week 1: SLP Short Term Goal 1 (Week 1): Pt will consume trials of regular textures demonstrating efficient mastication and oral clearance across 2 sessions with no more than min A cues for oral clearance of residue. SLP Short Term Goal 2 (Week 1): Pt will sustain attention to functional tasks for 5 min with mod A verbal cues for redirection. SLP Short Term Goal 3 (Week 1): Pt will demonstrate recall of how to use call bell for assistance with Max A verbal cues. SLP Short Term Goal 4 (Week 1): Pt will demonstrate ability to problem solve basic familiar tasks with Max A verbal cues.  Skilled Therapeutic Interventions:  Pt was seen for skilled ST targeting cognitive goals.  Upon arrival, pt stated "They are giving me a laxative" and requested to stay in the room.  RN arrived to administer medications and informed pt that she was giving her an antibiotic rather than a laxative.  Pt required mod assist verbal cues to recall events from previous therapy sessions this morning.  Pt became slightly anxious when asked about therapy sessions directly ("What did you do in therapy today?") but could provide more details when asked questions in a more indirect manner ("I see you're wearing a clean shirt, you must have taken a shower today.")  Pt appeared to lose her train of thought frequently during conversations with therapist as evidenced by jumping between multiple topics, but she appeared to have awareness of this and asked therapist to turn off the TV completely (rather than having it on mute), stating "I think that TV is distracting me."  Pt's attention to therapeutic conversations and topic maintenance significantly improved when talking about a shared interest  between herself and SLP and she was able to sustain her attention for 5 minute intervals with min cues for redirection.   Pt was left in bed with bed alarm set and call bell within reach.  Continue per current plan of care.    Pain Pain Assessment Pain Score: 0-No pain  Therapy/Group: Individual Therapy  Mena Simonis, Selinda Orion 02/01/2020, 2:03 PM

## 2020-02-01 NOTE — Progress Notes (Signed)
Physical Therapy Session Note  Patient Details  Name: Alison Cuevas MRN: [id number] Date of Birth: 1946-06-20  Today's Date: 02/01/2020 PT Individual Time: 1014-1107 PT Individual Time Calculation (min): 53 min   Short Term Goals: Week 1:  PT Short Term Goal 1 (Week 1): Patient will perform bed mobility with mod A. PT Short Term Goal 2 (Week 1): Patient will perform basic transfers with mod A using LRAD. PT Short Term Goal 3 (Week 1): Patient will initate gait training. PT Short Term Goal 4 (Week 1): Patient will perform standing activities >5 min focused on weight bearing for R LE tone management.  Skilled Therapeutic Interventions/Progress Updates:    Pt received sitting EOB with RN and NT present having just assisted pt from [facility] to EOB using stedy. RN reports pt having bleeding with BM. Assisted pt with sit>supine mod assist for R hemibody management and L sidelying for RN to perform assessment. Once RN complete, pt agreeable to therapy session though reports fatigue from therapy and toileting this AM - pt with confusion throughout session unable to recall events of the morning and not oriented to time of day. In supine, performed R LE ankle DF stertch then donned R LE DF assist brace total assist (put knee into flexion for tone management to increase ankle DF and improve brace alignment). In supine, performed R UE stretching for tone management of elbow extension, shoulder external rotation, and shoulder abduction <90degrees - provided low load long duration stretches in each direction. Supine>sitting R EOB with mod/max assist for R LE management and trunk upright. Sitting EOB demos strong pushing causing R anterior trunk lean/LOB requiring total assist to maintain upright - cuing to hold L hand on footboard of bed - once in that position could maintain sitting balance with supervision. Sit>stand EOB>stedy with mod assist for lifting and trunk control due to R anterior lean/LOB. Stedy transfer to  sink - while in high perched position of stedy performed oral care using L UE - when pt not using L UE to support trunk (but instead using it to perform a task) pt requires max/total assist to prevent R anterior trunk LOB - repeated cuing for L lateral trunk lean but pt with poor motor planning to achieve midline. Stedy>sitting in w/c with mod assist for lifting/lowering. Pt reports she would like to stay up in chair but reports discomfort in back - therapist retrieved and provided cushion for back (no alternate w/cs with custom backs available in pt's size at this time). Pt left sitting in w/c with needs in reach, R UE supported on 1/2 lap tray, lines intact, and seat belt alarm on.   Therapy Documentation Precautions:  Precautions Precautions: Fall Precaution Comments: Increased tone RUE (flexion)/RLE(extension) Required Braces or Orthoses: Cervical Brace Cervical Brace: Soft collar, For comfort Restrictions Weight Bearing Restrictions: No  Pain: Reports some discomfort in R hemibody during stretches but with slow movements and longer duration stretch more comfortable.   Therapy/Group: Individual Therapy  Tawana Scale , PT, DPT, CSRS  02/01/2020, 7:57 AM

## 2020-02-01 NOTE — Progress Notes (Signed)
Patient had another bowel movement ; bleeding noted. MD notified.

## 2020-02-01 NOTE — Progress Notes (Signed)
Physical Therapy Session Note  Patient Details  Name: Alison Cuevas MRN: [id number] Date of Birth: 1946-01-12  Today's Date: 02/01/2020 PT Individual Time: 1502-1528 PT Individual Time Calculation (min): 26 min   Short Term Goals: Week 1:  PT Short Term Goal 1 (Week 1): Patient will perform bed mobility with mod A. PT Short Term Goal 2 (Week 1): Patient will perform basic transfers with mod A using LRAD. PT Short Term Goal 3 (Week 1): Patient will initate gait training. PT Short Term Goal 4 (Week 1): Patient will perform standing activities >5 min focused on weight bearing for R LE tone management.  Skilled Therapeutic Interventions/Progress Updates: Pt presents side-lying in bed and difficult to arouse, but when awake, agreeable to therapy.  Pt required max A for sup to sit transfer, especially for RLE.  Pt sta EOB w/ strong pushing to right initially, but w/ transitions to left FA/on elbow, pt able to perform mid-line sitting.  Pt able to self-correct w/ verbal cues from PT.  Pt using visual input to maintain midline sitting.  PT facilitated proprioception to RUE w/ weight-bearing.  Pt returned to supine w/ max A and verbal and visual cues.  Bed alarm on and all needs in reach.     Therapy Documentation Precautions:  Precautions Precautions: Fall Precaution Comments: Increased tone RUE (flexion)/RLE(extension) Required Braces or Orthoses: Cervical Brace Cervical Brace: Soft collar, For comfort Restrictions Weight Bearing Restrictions: No General:   Vital Signs:  Pain: 0/10, pain meds given. Pain Assessment Pain Scale: 0-10 Pain Score: 0-No pain Pain Type: Chronic pain Pain Location: Back Pain Descriptors / Indicators: Aching Pain Frequency: Constant Pain Onset: On-going Pain Intervention(s): Medication (See eMAR) Mobility:     Therapy/Group: Individual Therapy  Alison Cuevas 02/01/2020, 4:06 PM

## 2020-02-01 NOTE — Progress Notes (Signed)
Patient disimpacted LBM 8/1. Noted bright red bleeding from rectum.Will continue to monitor.

## 2020-02-01 NOTE — Progress Notes (Signed)
Occupational Therapy Session Note  Patient Details  Name: Alison Cuevas MRN: [id number] Date of Birth: 1945-12-12  Today's Date: 02/01/2020 OT Individual Time: [phone] OT Individual Time Calculation (min): 58 min   Short Term Goals: Week 1:  OT Short Term Goal 1 (Week 1): Pt will recall hemi dressing techniques with min VC OT Short Term Goal 2 (Week 1): Pt will visually attend to R arm during bathing tasks to demo  improve R inattention OT Short Term Goal 3 (Week 1): Pt will transfer to [facility] with MAX A of 1 caregiver to decrease BOC for toileting OT Short Term Goal 4 (Week 1): Pt will locate 2/2 grooming items on R of sink for improved R inattention  Skilled Therapeutic Interventions/Progress Updates:    Pt greeted in bed, denying pain. Requesting to start session by eating breakfast but during bed mobility stated she had to use the restroom. She was hooked up to the IV so OT set her up to use the [facility]. Mod A for supine<sit with pt exhibiting Rt pushing tendencies EOB, facilitation provided for hands in lap to promote more neutral alignment. Rt foot/ankle carefully positioned on the [place] platform and then pt completed sit<stand in [place] with Mod A for power up. Manual facilitation provided to offset Rt pushing while transferring to the [facility]. While sitting, pt required cuing to concentrate on task of voiding as she was requesting to engage in several other tasks that were inappropriate given her current position (I.e. eating her breakfast). When pt was reminded that she was sitting on the toilet she stated "Oh, I forgot." OT provided stretching to Rt UE while she voided to offset flexor synergies. RN also in during session to provide suppository, Mod A for sit<partial stand while this was inserted. Pt voided bowels and required +2 for hygiene, 1 person providing balance assistance while 2nd person completed pericare. Note that pt began bleeding from rectum during hygiene, ?hemorrhoids as the cause, RN  present and aware of bleeding. Additional hygiene was needed to be completed due to bleeding. Pt then transferred to the w/c at dependent level using Stedy. Left her with all needs within reach, safety belt fastened, half lap tray positioned with Rt elbow extended. Notified NT that pt needed assistance with eating her breakfast. Tx focus placed on midline orientation, sit<stands, Rt sided tone reduction via weightbearing/ROM and ADL retraining.    Therapy Documentation Precautions:  Precautions Precautions: Fall Precaution Comments: Increased tone RUE (flexion)/RLE(extension) Required Braces or Orthoses: Cervical Brace Cervical Brace: Soft collar, For comfort Restrictions Weight Bearing Restrictions: No Pain: pt reported having back pain while lying in bed, declined medication from RN to address, opting instead to sit up in the w/c to help with managing her pain at end of session Pain Assessment Pain Scale: 0-10 Pain Score: 9  Pain Type: Chronic pain Pain Location: Back Pain Orientation: Distal;Posterior;Right;Left;Proximal;Upper;Mid;Lower;Medial;Lateral Pain Descriptors / Indicators: Aching;Discomfort Pain Frequency: Constant (chronic) Pain Onset: On-going Patients Stated Pain Goal: 3 Pain Intervention(s): Medication (See eMAR) ADL: ADL Upper Body Bathing: Maximal cueing, Moderate assistance Where Assessed-Upper Body Bathing: Wheelchair, Sitting at sink Lower Body Bathing: Maximal assistance, Maximal cueing Where Assessed-Lower Body Bathing: Bed level Upper Body Dressing: Maximal assistance, Maximal cueing Where Assessed-Upper Body Dressing: Sitting at sink, Wheelchair Lower Body Dressing: Dependent Where Assessed-Lower Body Dressing: Bed level (able to bridge hips) Toilet Transfer: Dependent (stedy)     Therapy/Group: Individual Therapy  Letetia Romanello A Jadarian Mckay 02/01/2020, 12:19 PM

## 2020-02-01 NOTE — Plan of Care (Signed)
°  Problem: RH BLADDER ELIMINATION Goal: RH STG MANAGE BLADDER WITH ASSISTANCE Description: STG Manage Bladder With min Assistance Outcome: Not Progressing; incontinence at times   

## 2020-02-01 NOTE — Progress Notes (Signed)
[facility] PHYSICAL MEDICINE & REHABILITATION PROGRESS NOTE   Subjective/Complaints: Per nursing had some bright red blood per rectum after disimpaction 5 cc or less estimated by nursing.  Patient denies any rectal pain. Imaging studies reviewed  ROS: Limited due to cognitive/behavioral    Objective:   CT HEAD WO CONTRAST  Result Date: 01/31/2020 CLINICAL DATA:  Stroke, follow-up; mental status change, unknown cause. EXAM: CT HEAD WITHOUT CONTRAST TECHNIQUE: Contiguous axial images were obtained from the base of the skull through the vertex without intravenous contrast. COMPARISON:  Brain MRI 01/27/2020 FINDINGS: Brain: Again demonstrated is a parenchymal hemorrhage centered within the left frontoparietal lobes. The hemorrhage measures 1.8 x 3.3 x 4.7 cm on the current examination. Overall, the parenchymal hemorrhage is not significantly changed in size but has decreased in transaxial dimensions and slightly increased in craniocaudal dimensions since the brain MRI of 01/27/2020. Similar moderate surrounding edema. As before, there may be subtle subarachnoid extension of hemorrhage along the frontoparietal convexity. Also unchanged, the hematoma abuts the superior aspect of the left lateral ventricle without intraventricular extension at this time (series 5, image 37). Unchanged mass effect upon the left lateral ventricle. No midline shift or evidence of ventricular entrapment. Known small acute ischemic infarcts within the right MCA/PCA watershed territory were better appreciated on the prior MRI. Stable background mild patchy hypoattenuation within the cerebral white matter which is nonspecific, but consistent with chronic small vessel ischemic disease. Vascular: No hyperdense vessel. Skull: Normal. Negative for fracture or focal lesion. Sinuses/Orbits: Visualized orbits show no acute finding. Near complete opacification of the left sphenoid sinus. Mild ethmoid sinus mucosal thickening. Prior right  mastoidectomy with partial opacification of remaining right mastoid air cells. IMPRESSION: 1. 1.8 x 3.3 x 4.7 cm parenchymal hemorrhage centered within the left frontoparietal lobes, not significantly changed in size as compared to the MRI of 01/27/2020. Unchanged moderate surrounding edema. As before, there may be subtle subarachnoid extension of hemorrhage along the frontoparietal convexity. Also unchanged, the hematoma abuts the superior aspect of the left lateral ventricle without intraventricular extension at this time. 2. Stable mass effect upon the left lateral ventricle. No midline shift or ventricular entrapment. 3. Known small acute ischemic infarcts in the right MCA/PCA watershed territory, better appreciated on the prior MRI. Electronically Signed   By: Kellie Simmering DO   On: 01/31/2020 11:25   VAS [place] LOWER EXTREMITY VENOUS (DVT)  Result Date: 02/01/2020  Lower Venous DVTStudy Indications: Stroke, and hemiplegia, immobility.  Limitations: Position. Comparison Study: No prior study Performing Technologist: Maudry Mayhew MHA, RDMS, RVT, RDCS  Examination Guidelines: A complete evaluation includes B-mode imaging, spectral Doppler, color Doppler, and power Doppler as needed of all accessible portions of each vessel. Bilateral testing is considered an integral part of a complete examination. Limited examinations for reoccurring indications may be performed as noted. The reflux portion of the exam is performed with the patient in reverse Trendelenburg.  +---------+---------------+---------+-----------+----------+--------------+ RIGHT    CompressibilityPhasicitySpontaneityPropertiesThrombus Aging +---------+---------------+---------+-----------+----------+--------------+ CFV      Full           Yes      Yes                                 +---------+---------------+---------+-----------+----------+--------------+ SFJ      Full                                                         +---------+---------------+---------+-----------+----------+--------------+  FV Prox  Full                                                        +---------+---------------+---------+-----------+----------+--------------+ FV Mid   Full                                                        +---------+---------------+---------+-----------+----------+--------------+ FV DistalFull                                                        +---------+---------------+---------+-----------+----------+--------------+ PFV      Full                                                        +---------+---------------+---------+-----------+----------+--------------+ POP      Full           Yes      Yes                                 +---------+---------------+---------+-----------+----------+--------------+ PTV      Full                                                        +---------+---------------+---------+-----------+----------+--------------+ PERO     Full                                                        +---------+---------------+---------+-----------+----------+--------------+   +---------+---------------+---------+-----------+----------+--------------+ LEFT     CompressibilityPhasicitySpontaneityPropertiesThrombus Aging +---------+---------------+---------+-----------+----------+--------------+ CFV      Full           Yes      Yes                                 +---------+---------------+---------+-----------+----------+--------------+ SFJ      Full                                                        +---------+---------------+---------+-----------+----------+--------------+ FV Prox  Full                                                        +---------+---------------+---------+-----------+----------+--------------+  FV Mid   Full                                                         +---------+---------------+---------+-----------+----------+--------------+ FV DistalFull                                                        +---------+---------------+---------+-----------+----------+--------------+ PFV      Full                                                        +---------+---------------+---------+-----------+----------+--------------+ POP      Full           Yes      Yes                                 +---------+---------------+---------+-----------+----------+--------------+ PTV      Full                                                        +---------+---------------+---------+-----------+----------+--------------+ PERO     Full                                                        +---------+---------------+---------+-----------+----------+--------------+     Summary: RIGHT: - There is no evidence of deep vein thrombosis in the lower extremity.  - No cystic structure found in the popliteal fossa.  LEFT: - There is no evidence of deep vein thrombosis in the lower extremity.  - No cystic structure found in the popliteal fossa.  *See table(s) above for measurements and observations. Electronically signed by Harold Barban MD on 02/01/2020 at 1:18:57 AM.    Final    Recent Labs    01/30/20 0526 02/01/20 1057  WBC 7.3  --   HGB 13.7 13.7  HCT 40.0 38.8  PLT 185  --    Recent Labs    01/30/20 0526  NA 133*  K 4.0  CL 97*  CO2 23  GLUCOSE 104*  BUN 19  CREATININE 0.84  CALCIUM 9.0    Intake/Output Summary (Last 24 hours) at 02/01/2020 1232 Last data filed at 02/01/2020 0816 Gross per 24 hour  Intake 1324.57 ml  Output 600 ml  Net 724.57 ml     Physical Exam: Vital Signs Blood pressure (!) 145/64, pulse 89, temperature 98.3 F (36.8 C), temperature source Oral, resp. rate 16, height 5\' 2"  (1.575 m), weight 60.3 kg, last menstrual period 06/27/1972, SpO2 97 %.   General: No acute distress Mood and affect are  appropriate Heart: Regular rate and rhythm no rubs murmurs  or extra sounds Lungs: Clear to auscultation, breathing unlabored, no rales or wheezes Abdomen: Positive bowel sounds, soft nontender to palpation, nondistended Extremities: No clubbing, cyanosis, or edema Skin: No evidence of breakdown, no evidence of rash   Skin: Clean and intact without signs of breakdown Neuro: Oriented to name and hospital with cues. Normal language. Non-focal CN exam other than right central 7. Right inattention. RUE and RLE 0/5. LUE/LLE 4-4+/5 prox to distal. Senses pain in right arm and leg.  MusculoskeletalRight heel cord contracture--can reduce to about -30-40.       Assessment/Plan: 1. 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  Physiatrist is providing close team supervision and 24 hour management of active medical problems listed below.  Physiatrist and rehab team continue to assess barriers to discharge/monitor patient progress toward functional and medical goals  Care Tool:  Bathing    Body parts bathed by patient: Chest, Abdomen, Front perineal area, Face   Body parts bathed by helper: Right arm, Left arm, Buttocks Body parts n/a: Right upper leg, Left upper leg, Right lower leg, Left lower leg   Bathing assist Assist Level: Maximal Assistance - Patient 24 - 49%     Upper Body Dressing/Undressing Upper body dressing   What is the patient wearing?: Pull over shirt    Upper body assist Assist Level: Total Assistance - Patient < 25%    Lower Body Dressing/Undressing Lower body dressing      What is the patient wearing?: Incontinence brief     Lower body assist Assist for lower body dressing: Total Assistance - Patient < 25%     Toileting Toileting    Toileting assist Assist for toileting: 2 Helpers (using Stedy)     Transfers Chair/bed transfer  Transfers  assist     Chair/bed transfer assist level: Dependent - mechanical lift (stedy)     Locomotion Ambulation   Ambulation assist   Ambulation activity did not occur: Safety/medical concerns (decreased motor control, R hemi)          Walk 10 feet activity   Assist  Walk 10 feet activity did not occur: Safety/medical concerns        Walk 50 feet activity   Assist Walk 50 feet with 2 turns activity did not occur: Safety/medical concerns         Walk 150 feet activity   Assist Walk 150 feet activity did not occur: Safety/medical concerns         Walk 10 feet on uneven surface  activity   Assist Walk 10 feet on uneven surfaces activity did not occur: Safety/medical concerns         Wheelchair     Assist Will patient use wheelchair at discharge?: Yes (Per PT long-term goals) Type of Wheelchair: Manual Wheelchair activity did not occur: Safety/medical concerns (unable with and without skilled intervention due to poor motor planning and attention)         Wheelchair 50 feet with 2 turns activity    Assist    Wheelchair 50 feet with 2 turns activity did not occur: Safety/medical concerns       Wheelchair 150 feet activity     Assist  Wheelchair 150 feet activity did not occur: Safety/medical concerns       Blood pressure (!) 145/64, pulse 89, temperature 98.3 F (36.8 C), temperature source Oral, resp. rate 16, height 5\' 2"  (1.575 m), weight 60.3  kg, last menstrual period 06/27/1972, SpO2 97 %.  Medical Problem List and Plan: 1. 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             -patient may shower             -ELOS/Goals: 28-32 days/Min A             -continue CIR therapies with PT, OT,  SLP  -will try an adjustable tension night-splint/dorsi-flexion splint to address right heel cord contracture. Spoke with Gerald Stabs at [place] today. It has a modest sole which could be used for wb/transfers 2.  Antithrombotics: -DVT/anticoagulation:  Mechanical: Sequential compression devices, below knee Bilateral lower extremities             -antiplatelet therapy: None d/t hemorrhage 3. Chronic neck pain/Pain Management: Uses  Hydrocodone prn at home--will hold for now till mentation clears.              Monitor with increased exertion 4. Mood: LCSW to follow for evaluation and support.              -antipsychotic agents: N/A 5. Neuropsych: This patient is not capable of making decisions on her own behalf. 6. Skin/Wound Care: Routine pressure relief measures.  7. Fluids/Electrolytes/Nutrition:   -mild hyponatremia, ongoing/chronic--recheck Monday  -encourage po liquids  -consider megace for appetite, but ate a little better yesterday  -pt was -105 cc yesterday. Will restart  NS IVF at 50cc/hr continuously for now until she perks up a bit 8. HTN: Monitor BP tid             Verapamil and lisinopril.              reasonable control until this morning's reading. Continue to monitor for pattern 9. Kleb pneumonia UTI: ceftriaxone x3 days and now on keflex to complete 7 day course                10. Delirum/AMS:  Seems worse since admission to rehab.   -  baclofen and vicodin already stopped, avoid other neuro-sedating meds  -Repeat CT 8/6 head did not demonstrate any new infarcts or bleeds  -Continue seroquel 12.5mg  at 2000 nightly Still with poor intake we will continue IV fluids 11. Dysphagia:   -D3/thins, advance per SLP   13.  Small volume rectal bleeding per nursing after disimpaction, H&H stable.  Likely traumatic due to hard stool, will increase stool softeners LOS: 3 days A FACE TO FACE EVALUATION WAS PERFORMED  Charlett Blake 02/01/2020, 12:32 PM

## 2020-02-02 MED ORDER — NEOMYCIN-POLYMYXIN-HC 1 % OT SOLN
3.0000 [drp] | Freq: Three times a day (TID) | OTIC | Status: DC
  Administered 2020-02-02 – 2020-02-04 (×6): 3 [drp] via OTIC
  Filled 2020-02-02 (×2): qty 10

## 2020-02-02 NOTE — Progress Notes (Signed)
[place] PHYSICAL MEDICINE & REHABILITATION PROGRESS NOTE   Subjective/Complaints: Had a BM this morning but no blood, did have some blood with disimpaction yesterday, hemoglobin reviewed stable  Complains of right ear pain  ROS: Limited due to cognitive/behavioral    Objective:   No results found. Recent Labs    02/01/20 1057  HGB 13.7  HCT 38.8   No results for input(s): NA, K, CL, CO2, GLUCOSE, BUN, CREATININE, CALCIUM in the last 72 hours.  Intake/Output Summary (Last 24 hours) at 02/02/2020 1113 Last data filed at 02/02/2020 0809 Gross per 24 hour  Intake 2349.8 ml  Output 1600 ml  Net 749.8 ml     Physical Exam: Vital Signs Blood pressure (!) 145/58, pulse 91, temperature 98 F (36.7 C), resp. rate 18, height 5\' 2"  (1.575 m), weight 60.3 kg, last menstrual period 06/27/1972, SpO2 96 %. HEENT otitis externa right greater than left external auditory canal, TMs look clear General: No acute distress Mood and affect are appropriate Heart: Regular rate and rhythm no rubs murmurs or extra sounds Lungs: Clear to auscultation, breathing unlabored, no rales or wheezes Abdomen: Positive bowel sounds, soft nontender to palpation, nondistended Extremities: No clubbing, cyanosis, or edema Skin: No evidence of breakdown, no evidence of rash  Skin: Clean and intact without signs of breakdown Neuro: Oriented to name and hospital with cues. Normal language. Non-focal CN exam other than right central 7. Right inattention. RUE and RLE 0/5. LUE/LLE 4-4+/5 prox to distal. Senses pain in right arm and leg.  MusculoskeletalRight heel cord contracture--can reduce to about -30-40.       Assessment/Plan: 1. 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  Physiatrist is providing close team supervision and 24 hour management of active medical problems listed  below.  Physiatrist and rehab team continue to assess barriers to discharge/monitor patient progress toward functional and medical goals  Care Tool:  Bathing    Body parts bathed by patient: Chest, Abdomen, Front perineal area, Face   Body parts bathed by helper: Right arm, Left arm, Buttocks Body parts n/a: Right upper leg, Left upper leg, Right lower leg, Left lower leg   Bathing assist Assist Level: Maximal Assistance - Patient 24 - 49%     Upper Body Dressing/Undressing Upper body dressing   What is the patient wearing?: Pull over shirt    Upper body assist Assist Level: Total Assistance - Patient < 25%    Lower Body Dressing/Undressing Lower body dressing      What is the patient wearing?: Incontinence brief     Lower body assist Assist for lower body dressing: Total Assistance - Patient < 25%     Toileting Toileting    Toileting assist Assist for toileting: 2 Helpers     Transfers Chair/bed transfer  Transfers assist     Chair/bed transfer assist level: Dependent - mechanical lift (stedy)     Locomotion Ambulation   Ambulation assist   Ambulation activity did not occur: Safety/medical concerns (decreased motor control, R hemi)          Walk 10 feet activity   Assist  Walk 10 feet activity did not occur: Safety/medical concerns        Walk 50 feet activity   Assist Walk 50 feet with 2 turns activity did not occur: Safety/medical concerns         Walk 150 feet activity   Assist Walk  150 feet activity did not occur: Safety/medical concerns         Walk 10 feet on uneven surface  activity   Assist Walk 10 feet on uneven surfaces activity did not occur: Safety/medical concerns         Wheelchair     Assist Will patient use wheelchair at discharge?: Yes (Per PT long-term goals) Type of Wheelchair: Manual Wheelchair activity did not occur: Safety/medical concerns (unable with and without skilled intervention due to  poor motor planning and attention)         Wheelchair 50 feet with 2 turns activity    Assist    Wheelchair 50 feet with 2 turns activity did not occur: Safety/medical concerns       Wheelchair 150 feet activity     Assist  Wheelchair 150 feet activity did not occur: Safety/medical concerns       Blood pressure (!) 145/58, pulse 91, temperature 98 F (36.7 C), resp. rate 18, height 5\' 2"  (1.575 m), weight 60.3 kg, last menstrual period 06/27/1972, SpO2 96 %.  Medical Problem List and Plan: 1. 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             -patient may shower             -ELOS/Goals: 28-32 days/Min A             -continue CIR therapies with PT, OT, SLP  -will try an adjustable tension night-splint/dorsi-flexion splint to address right heel cord contracture.2.  Antithrombotics: -DVT/anticoagulation:  Mechanical: Sequential compression devices, below knee Bilateral lower extremities             -antiplatelet therapy: None d/t hemorrhage 3. Chronic neck pain/Pain Management: Uses  Hydrocodone prn at home--will hold for now till mentation clears.              Monitor with increased exertion 4. Mood: LCSW to follow for evaluation and support.              -antipsychotic agents: N/A 5. Neuropsych: This patient is not capable of making decisions on her own behalf. 6. Skin/Wound Care: Routine pressure relief measures.  7. Fluids/Electrolytes/Nutrition:   -mild hyponatremia, ongoing/chronic--recheck Monday  -encourage po liquids  -consider megace for appetite, but ate a little better yesterday, 2 recorded meals 30 and 80%  -pt was -105 cc yesterday. Will restart  NS IVF at 50cc/hr continuously for now until she perks up a bit,  fluid intake less than at thousand yesterday p.o., 480 thus far today 8. HTN: Monitor BP tid             Verapamil and lisinopril.              reasonable control until this morning's reading. Continue to monitor for pattern Vitals:   02/01/20 1933 02/02/20 0609  BP: (!) 127/53 (!) 145/58  Pulse: 92 91  Resp: 16 18  Temp: 98.3 F (36.8 C) 98 F (36.7 C)  SpO2: 97% 96%  Hypertension controlled 8/8 9. Kleb pneumonia UTI: ceftriaxone x3 days and now on keflex to complete 7 day course should complete the course tomorrow               10. Delirum/AMS:  Seems worse since admission to rehab.   -  baclofen and vicodin already stopped, avoid other neuro-sedating meds  -Repeat CT 8/6 head did not demonstrate any new infarcts or bleeds  -Continue seroquel 12.5mg  at 2000 nightly Still with poor intake we will continue IV fluids 11. Dysphagia:   -D3/thins, advance per SLP   13.  Small volume rectal bleeding per nursing after disimpaction, H&H stable.  Likely traumatic due to hard stool, will increase stool softeners 14.  Otitis externa symptomatic on the right side will order Corticosporin drops LOS: 4 days A FACE TO FACE EVALUATION WAS PERFORMED  Charlett Blake 02/02/2020, 11:13 AM

## 2020-02-02 NOTE — Plan of Care (Signed)
  Problem: RH BLADDER ELIMINATION Goal: RH STG MANAGE BLADDER WITH ASSISTANCE Description: STG Manage Bladder With min Assistance Outcome: Not Progressing; in and out cath    

## 2020-02-03 ENCOUNTER — Inpatient Hospital Stay (HOSPITAL_COMMUNITY): Payer: Medicare Other

## 2020-02-03 ENCOUNTER — Inpatient Hospital Stay (HOSPITAL_COMMUNITY): Payer: Medicare Other | Admitting: Speech Pathology

## 2020-02-03 LAB — CBC WITH DIFFERENTIAL/PLATELET
Abs Immature Granulocytes: 0.02 10*3/uL (ref 0.00–0.07)
Basophils Absolute: 0 10*3/uL (ref 0.0–0.1)
Basophils Relative: 1 %
Eosinophils Absolute: 0.2 10*3/uL (ref 0.0–0.5)
Eosinophils Relative: 3 %
HCT: 32.7 % — ABNORMAL LOW (ref 36.0–46.0)
Hemoglobin: 11.5 g/dL — ABNORMAL LOW (ref 12.0–15.0)
Immature Granulocytes: 0 %
Lymphocytes Relative: 38 %
Lymphs Abs: 2.4 10*3/uL (ref 0.7–4.0)
MCH: 30.5 pg (ref 26.0–34.0)
MCHC: 35.2 g/dL (ref 30.0–36.0)
MCV: 86.7 fL (ref 80.0–100.0)
Monocytes Absolute: 0.5 10*3/uL (ref 0.1–1.0)
Monocytes Relative: 8 %
Neutro Abs: 3 10*3/uL (ref 1.7–7.7)
Neutrophils Relative %: 50 %
Platelets: 149 10*3/uL — ABNORMAL LOW (ref 150–400)
RBC: 3.77 MIL/uL — ABNORMAL LOW (ref 3.87–5.11)
RDW: 12.9 % (ref 11.5–15.5)
WBC: 6.2 10*3/uL (ref 4.0–10.5)
nRBC: 0 % (ref 0.0–0.2)

## 2020-02-03 LAB — BASIC METABOLIC PANEL
Anion gap: 8 (ref 5–15)
BUN: 9 mg/dL (ref 8–23)
CO2: 23 mmol/L (ref 22–32)
Calcium: 8.4 mg/dL — ABNORMAL LOW (ref 8.9–10.3)
Chloride: 104 mmol/L (ref 98–111)
Creatinine, Ser: 0.72 mg/dL (ref 0.44–1.00)
GFR calc Af Amer: >60 mL/min (ref >60)
GFR calc non Af Amer: >60 mL/min (ref >60)
Glucose, Bld: 98 mg/dL (ref 70–99)
Potassium: 3.3 mmol/L — ABNORMAL LOW (ref 3.5–5.1)
Sodium: 135 mmol/L (ref 135–145)

## 2020-02-03 MED ORDER — ACETAMINOPHEN 500 MG PO TABS
500.0000 mg | ORAL_TABLET | Freq: Three times a day (TID) | ORAL | Status: DC
  Administered 2020-02-03 – 2020-02-20 (×47): 500 mg via ORAL
  Filled 2020-02-03 (×49): qty 1

## 2020-02-03 MED ORDER — SODIUM CHLORIDE 0.9 % IV SOLN: INTRAVENOUS | Status: DC

## 2020-02-03 MED ORDER — POTASSIUM CHLORIDE CRYS ER 20 MEQ PO TBCR
20.0000 meq | EXTENDED_RELEASE_TABLET | Freq: Every day | ORAL | Status: DC
  Administered 2020-02-03 – 2020-02-20 (×18): 20 meq via ORAL
  Filled 2020-02-03 (×18): qty 1

## 2020-02-03 NOTE — Progress Notes (Signed)
Physical Therapy Session Note  Patient Details  Name: Alison Cuevas MRN: [id number] Date of Birth: 06/01/1946  Today's Date: 02/03/2020 PT Individual Time: [phone] and 1110-1145 PT Individual Time Calculation (min): 55 min and 35 min   Short Term Goals: Week 1:  PT Short Term Goal 1 (Week 1): Patient will perform bed mobility with mod A. PT Short Term Goal 2 (Week 1): Patient will perform basic transfers with mod A using LRAD. PT Short Term Goal 3 (Week 1): Patient will initate gait training. PT Short Term Goal 4 (Week 1): Patient will perform standing activities >5 min focused on weight bearing for R LE tone management.  Skilled Therapeutic Interventions/Progress Updates:     Session 1: Patient in bed upon PT arrival. Patient alert and agreeable to PT session. Patient denied pain during session. Patient reports that adjustable tension night splint for her R foot was not donned last night, will discuss with nursing staff and provide education as needed.   Therapeutic Activity: Bed Mobility: Patient performed sit to supine with min-mod A for LE and trunk management.  Transfers: Patient performed stand pivot transfer with mod-max A due to difficulty with sequencing and R LE during pivot. She performed sit to/from stand in the [place] x3 from the w/c, x1 from the [facility], and x3 from the steady seat with mod A +2 progressing to min A +1. Provided verbal cues and manual facilitation for R LE placement, L hand placement in the middle of to bar to reduce pushing, forward and L weight shift, and controlled descent with manual facilitation to place hips in the chair due to R bias while lowering. Patient was continent of bowl on the [facility]. Required total a for peri-care and LB dressing while standing in the [place].  Neuromuscular Re-ed: Patient performed the following sitting and standing balance activities: -sitting EOB patient progressed from min A-supervision for sitting balance focused on midline  orientation with placement of L UE in her lap and use of a visual target for L shoulder to find and maintain midline -standing balance in the Stedy 1x4 min and 2x2-2.5 min focused on R knee extension and heel cord stretch in standing to reduce PF contracture, provided A/P glide to talus for increased stretch to joint capsule, cued patient for R quad activation performing knee flexion extension in standing 2x10 then prolonged holds blocking R knee for stretch, also focused on midline orientation with same visual target cues provided in sitting progressing from min-mod A for trunk support form a second person to SBA in the [place] -focused on R UE holding the bar of the Stedy in standing with approximation at the hand and wrist x2 in standing, performed elbow flexion/extension for improved trunk support and reduced R lean 2x5  Patient in bed at end of session with breaks locked, bed alarm set, and all needs within reach.   Session 2: Patient in bed with RN and NT finishing a bladder scan upon PT arrival. Patient alert and agreeable to PT session. Patient denied pain during session. Reported that she had a "great nap" between sessions.   Therapeutic Activity: Bed Mobility: Patient performed rolling R/L with max A for time management to pull her pants up with total A bed level. She performed supine to sit with min-mod A for trunk and R LE manamgnet. Provided verbal cues for rolling to R side and using LE UE to push up on the bed rail. Donned B socks and tennis shoes bed level prior to patient  sitting EOB for improved sitting balance.  Transfers: Patient performed stand pivot transfer with mild improvement in stepping and pivoting with facilitation for weight shift this session. She performed sit to/from stand x2 with min A in the // bars with B UE support with HOH assist to maintain grip with R UE. Provided verbal cues and facilitation for foot placement, forward and L weight shift, and R knee/hip  extension.  Neuromuscular Re-ed: Patient performed the following standing balance activities: -standing 2x2.5 min for prolonged heel cord stretch with weight bearing, placed mirror in front for visual feedback for midline orientation and provided A/P glide to talus for increased DF stretch, as above -performed R elbow extension stretch and progressed to elbow extension with shoulder flexion, provided prolonged stretch 2x1-2 min between standing trials for reduced R UE tone -focused on R hand grip on // bars progressing from total A to min A to maintain her hand on the bar  Attempted donning R AFO for improved DF in sitting, unable to attain neutral position for proper fit at this time, will re-attempt when ROM improves.   **Noted improved DF PROM in sitting at end of second session. Progressed from -26 degrees from neutral at beginning of first session to -8 degrees from neutral and end of second session.  Patient in w/c with R UE elevated on lap tray at end of session with breaks locked, seat belt alarm set, and all needs within reach.    Therapy Documentation Precautions:  Precautions Precautions: Fall Precaution Comments: Increased tone RUE (flexion)/RLE(extension) Required Braces or Orthoses: Cervical Brace Cervical Brace: Soft collar, For comfort Restrictions Weight Bearing Restrictions: No   Therapy/Group: Individual Therapy  Courtni Balash L Rashell Shambaugh.PT, DPT  02/03/2020, 3:51 PM

## 2020-02-03 NOTE — Progress Notes (Signed)
Patient is complaining of increased muscle spasms in her back and is asking for medication to help with them. Nurse said she would leave note so doctor can address.

## 2020-02-03 NOTE — Progress Notes (Signed)
Occupational Therapy Session Note  Patient Details  Name: Alison Cuevas MRN: [id number] Date of Birth: [birth year]  Today's Date: 02/03/2020 OT Individual Time: 4010-2725 OT Individual Time Calculation (min): 70 min    Short Term Goals: Week 1:  OT Short Term Goal 1 (Week 1): Pt will recall hemi dressing techniques with min VC OT Short Term Goal 2 (Week 1): Pt will visually attend to R arm during bathing tasks to demo  improve R inattention OT Short Term Goal 3 (Week 1): Pt will transfer to [facility] with MAX A of 1 caregiver to decrease BOC for toileting OT Short Term Goal 4 (Week 1): Pt will locate 2/2 grooming items on R of sink for improved R inattention  Skilled Therapeutic Interventions/Progress Updates:    Pt received supine with no c/o pain, eager to get started and eat breakfast. Pt transitioned to EOB with manual facilitation required to move RLE off bed and min A to come to sitting. Once EOB pt with strong R push, managed with RUE placed in weightbearing. Pt set up to eat breakfast and was able to do so EOB with mod facilitation for maintaining sitting balance. No overt s/s of aspiration. Pt finished breakfast and completed stand pivot transfer to the w/c with mod cueing for technique, with mod A overall, +2 for safety. Pt completed grooming tasks at the sink with set up assist. Pt reported need to use the bathroom. She stood at the sink with mod A, requiring facilitation for RLE stabilization/knee extension. While pt was standing the [facility] was placed behind her to transfer onto. Pt incontinent of BM and then further voided BM while seated. Max A for peri hygiene. Pt donned pants with max A overall. Total facilitation for RUE provided throughout session with flexor tone occasionally requiring more manual facilitation to come into more neutral positioning. Pt was left sitting up in the w/c, provided with a fresh cup of coffee and chair alarm belt fastened. R foot rest removed 2/2 extensor tone in the  RLE causing pt's heel to rest on the footrest and this puts her at risk for a pressure injury. Edu also provided to RN re this. All needs met.   Therapy Documentation Precautions:  Precautions Precautions: Fall Precaution Comments: Increased tone RUE (flexion)/RLE(extension) Required Braces or Orthoses: Cervical Brace Cervical Brace: Soft collar, For comfort Restrictions Weight Bearing Restrictions: No   Therapy/Group: Individual Therapy  Curtis Sites 02/03/2020, 6:36 AM

## 2020-02-03 NOTE — Progress Notes (Addendum)
[place] PHYSICAL MEDICINE & REHABILITATION PROGRESS NOTE   Subjective/Complaints: Uneventful night. Does complain of back pain/spasms this morning. Some blood in stool related to constipation/disimpaction this weekend.   ROS: Patient denies fever, rash, sore throat, blurred vision, nausea, vomiting, diarrhea, cough, shortness of breath or chest pain,  headache, or mood change.    Objective:   No results found. Recent Labs    02/01/20 1057 02/03/20 0434  WBC  --  6.2  HGB 13.7 11.5*  HCT 38.8 32.7*  PLT  --  149*   Recent Labs    02/03/20 0434  NA 135  K 3.3*  CL 104  CO2 23  GLUCOSE 98  BUN 9  CREATININE 0.72  CALCIUM 8.4*    Intake/Output Summary (Last 24 hours) at 02/03/2020 1341 Last data filed at 02/03/2020 0900 Gross per 24 hour  Intake 218 ml  Output 1250 ml  Net -1032 ml     Physical Exam: Vital Signs Blood pressure (!) 154/72, pulse 91, temperature 98.3 F (36.8 C), resp. rate 18, height 5\' 2"  (1.575 m), weight 60.3 kg, last menstrual period 06/27/1972, SpO2 97 %. Constitutional: No distress . Vital signs reviewed. HEENT: EOMI, oral membranes moist Neck: supple Cardiovascular: RRR without murmur. No JVD    Respiratory/Chest: CTA Bilaterally without wheezes or rales. Normal effort    GI/Abdomen: BS +, non-tender, non-distended Ext: no clubbing, cyanosis, or edema Psych: pleasant and cooperative Skin: Clean and intact without signs of breakdown Neuro: Oriented to name and hospital with cues. Normal language. Non-focal CN exam other than right central 7. Right inattention. RUE and RLE 0/5. LUE/LLE 4-4+/5 prox to distal. Senses pain in right arm and leg.  MusculoskeletalRight heel cord contracture, mild low back pain.        Assessment/Plan: 1. 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  Physiatrist is providing close team supervision  and 24 hour management of active medical problems listed below.  Physiatrist and rehab team continue to assess barriers to discharge/monitor patient progress toward functional and medical goals  Care Tool:  Bathing    Body parts bathed by patient: Chest, Abdomen, Front perineal area, Face, Right arm, Right upper leg, Left upper leg   Body parts bathed by helper: Left arm, Buttocks, Right lower leg, Left lower leg Body parts n/a: Right upper leg, Left upper leg, Right lower leg, Left lower leg   Bathing assist Assist Level: Moderate Assistance - Patient 50 - 74%     Upper Body Dressing/Undressing Upper body dressing   What is the patient wearing?: Hospital gown only    Upper body assist Assist Level: Maximal Assistance - Patient 25 - 49%    Lower Body Dressing/Undressing Lower body dressing      What is the patient wearing?: Pants     Lower body assist Assist for lower body dressing: Maximal Assistance - Patient 25 - 49%     Toileting Toileting    Toileting assist Assist for toileting: Maximal Assistance - Patient 25 - 49%     Transfers Chair/bed transfer  Transfers assist     Chair/bed transfer assist level: 2 Helpers     Locomotion Ambulation   Ambulation assist   Ambulation activity did not occur: Safety/medical concerns (decreased motor control, R hemi)          Walk 10 feet activity   Assist  Walk 10 feet activity did not occur:  Safety/medical concerns        Walk 50 feet activity   Assist Walk 50 feet with 2 turns activity did not occur: Safety/medical concerns         Walk 150 feet activity   Assist Walk 150 feet activity did not occur: Safety/medical concerns         Walk 10 feet on uneven surface  activity   Assist Walk 10 feet on uneven surfaces activity did not occur: Safety/medical concerns         Wheelchair     Assist Will patient use wheelchair at discharge?: Yes (Per PT long-term goals) Type of  Wheelchair: Manual Wheelchair activity did not occur: Safety/medical concerns (unable with and without skilled intervention due to poor motor planning and attention)         Wheelchair 50 feet with 2 turns activity    Assist    Wheelchair 50 feet with 2 turns activity did not occur: Safety/medical concerns       Wheelchair 150 feet activity     Assist  Wheelchair 150 feet activity did not occur: Safety/medical concerns       Blood pressure (!) 154/72, pulse 91, temperature 98.3 F (36.8 C), resp. rate 18, height 5\' 2"  (1.575 m), weight 60.3 kg, last menstrual period 06/27/1972, SpO2 97 %.  Medical Problem List and Plan: 1. 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             -patient may shower             -ELOS/Goals: 28-32 days/Min A             -continue CIR therapies with PT, OT, SLP  - adjustable tension night-splint/dorsi-flexion splint to address right heel cord contracture. 2.  Antithrombotics: -DVT/anticoagulation:  Mechanical: Sequential compression devices, below knee Bilateral lower extremities             -antiplatelet therapy: None d/t hemorrhage 3. Chronic neck pain/Pain Management: Uses  Hydrocodone prn at home. Continue to hold given AMS  -schedule tylenol 500mg  tid  -kpad 4. Mood: LCSW to follow for evaluation and support.              -antipsychotic agents: N/A 5. Neuropsych: This patient is not capable of making decisions on her own behalf. 6. Skin/Wound Care: Routine pressure relief measures.  7. Fluids/Electrolytes/Nutrition:   -I personally reviewed the patient's labs today.     -sodium better, mild hypokalemia   -potassium supplement   -encourage po liquids  -resumed  NS IVF at 50cc/hr--can change  to HS  -check weights. Encourage PO  -encourage PO 8. HTN: Monitor BP tid             Verapamil and lisinopril.              reasonable control until this morning's reading. Continue to monitor for pattern Vitals:   02/02/20 1953 02/03/20 0459  BP: (!) 155/68 (!) 154/72  Pulse: 84 91  Resp: 16 18  Temp: 98.5 F (36.9 C) 98.3 F (36.8 C)  SpO2: 96% 97%  Hypertension controlled 8/9 9. Kleb pneumonia UTI: ceftriaxone x3 days and now on keflex to complete 7 day course. abx completes today           10.  Delirum/AMS:  .   - baclofen and vicodin already stopped, avoid other neuro-sedating meds  -Repeat CT 8/6 head did not demonstrate any new infarcts or bleeds  -better sleep with seroquel 12.5mg  at 2000 nightly Still with poor intake we will continue IV fluids 11. Dysphagia:   -D3/thins, advance per SLP   13.  Small volume rectal bleeding per nursing after disimpaction, H&H stable.   Likely traumatic due to hard stool  -increased stool softeners 14.  Otitis externa symptomatic on the right side: Corticosporin drops   LOS: 5 days A FACE TO FACE EVALUATION WAS PERFORMED  Meredith Staggers 02/03/2020, 1:41 PM

## 2020-02-03 NOTE — Progress Notes (Signed)
Speech Language Pathology Daily Session Note  Patient Details  Name: Alison Cuevas MRN: [id number] Date of Birth: 13-May-1946  Today's Date: 02/03/2020 SLP Individual Time: 1300-1400 SLP Individual Time Calculation (min): 60 min  Short Term Goals: Week 1: SLP Short Term Goal 1 (Week 1): Pt will consume trials of regular textures demonstrating efficient mastication and oral clearance across 2 sessions with no more than min A cues for oral clearance of residue. SLP Short Term Goal 2 (Week 1): Pt will sustain attention to functional tasks for 5 min with mod A verbal cues for redirection. SLP Short Term Goal 3 (Week 1): Pt will demonstrate recall of how to use call bell for assistance with Max A verbal cues. SLP Short Term Goal 4 (Week 1): Pt will demonstrate ability to problem solve basic familiar tasks with Max A verbal cues.  Skilled Therapeutic Interventions:   Patient seen for skilled SLP treatment with focus on cognitive-linguistic functioning. Upon SLP entering room, patient recognized him and said, "you are back!" (SLP saw patient end of previous week). Attention overall was very good in large room with two other patients working on PT and OT (min-mod intensity of distractions). Patient participated in completing several subtests from CLQT(Cognitive Linguistic Quick Test) and had the most difficulty with generative naming and Story Retelling. Patient was oriented to month, approximately date (stated it was the 7th) and utilized daily therapy schedule when she could not state the year with visual cue. She recalled one activity from PT session and was able to state physical deficits of right arm and leg not moving. Overall, patient seems to have some increased awareness, appears less confused overall. She continues to benefit from skilled SLP intervention for cognitive-linguistic goals.   Pain Pain Assessment Pain Scale: 0-10 Pain Score: 0-No pain  Therapy/Group: Individual Therapy  Sonia Baller, MA, CCC-SLP 02/03/20 3:14 PM

## 2020-02-04 ENCOUNTER — Inpatient Hospital Stay (HOSPITAL_COMMUNITY): Payer: Medicare Other

## 2020-02-04 ENCOUNTER — Inpatient Hospital Stay (HOSPITAL_COMMUNITY): Payer: Medicare Other | Admitting: Occupational Therapy

## 2020-02-04 ENCOUNTER — Encounter: Payer: Self-pay | Admitting: Family Medicine

## 2020-02-04 LAB — BASIC METABOLIC PANEL
Anion gap: 9 (ref 5–15)
BUN: 9 mg/dL (ref 8–23)
CO2: 23 mmol/L (ref 22–32)
Calcium: 8.7 mg/dL — ABNORMAL LOW (ref 8.9–10.3)
Chloride: 102 mmol/L (ref 98–111)
Creatinine, Ser: 0.69 mg/dL (ref 0.44–1.00)
GFR calc Af Amer: >60 mL/min (ref >60)
GFR calc non Af Amer: >60 mL/min (ref >60)
Glucose, Bld: 100 mg/dL — ABNORMAL HIGH (ref 70–99)
Potassium: 3.8 mmol/L (ref 3.5–5.1)
Sodium: 134 mmol/L — ABNORMAL LOW (ref 135–145)

## 2020-02-04 LAB — PREALBUMIN: Prealbumin: 16.4 mg/dL — ABNORMAL LOW (ref 18–38)

## 2020-02-04 MED ORDER — CARBAMIDE PEROXIDE 6.5 % OT SOLN
5.0000 [drp] | Freq: Two times a day (BID) | OTIC | Status: AC
  Administered 2020-02-04 – 2020-02-06 (×5): 5 [drp] via OTIC
  Filled 2020-02-04: qty 15

## 2020-02-04 NOTE — Progress Notes (Signed)
Physical Therapy Session Note  Patient Details  Name: Alison Cuevas MRN: [id number] Date of Birth: May 29, 1946  Today's Date: 02/04/2020 PT Individual Time: 0800-0900 and 1130-1200 PT Individual Time Calculation (min): 60 min and 30 min   Short Term Goals: Week 1:  PT Short Term Goal 1 (Week 1): Patient will perform bed mobility with mod A. PT Short Term Goal 2 (Week 1): Patient will perform basic transfers with mod A using LRAD. PT Short Term Goal 3 (Week 1): Patient will initate gait training. PT Short Term Goal 4 (Week 1): Patient will perform standing activities >5 min focused on weight bearing for R LE tone management.  Skilled Therapeutic Interventions/Progress Updates:     Session 1: Patient sitting up in bed eating breakfast with LPN in the room upon PT arrival. Patient alert and agreeable to PT session. Patient denied pain during session, reported having back pain earlier this morning that improved with repositioning in the bed. Supervised patient finishing breakfast and discussed L PRAFO and R DF adjustable night splint. Patient unsure of which splints were worn last night. Labelled PRAFO with L sticker and night splint with R sticker and placed R PRAFO in the closet to deter use. Placed a sign above the bed to ensure proper donning of splint and PRAFO at night. Also encouraged patient to wear night splint when in the bed as tolerated. Patient stated agreement.   Placed R adjustable night splint on patient with knee bent to demonstrate proper placement. Patient continues to have increased PF, 2-3 inches between heel and bottom of the splint. Adjusted splint straps to promote eversion and DF and adjusted padding to maintain skin integrity. Attempted sit to/from stand x1 from the bed with splint donned, patient's R ankle rolled laterally in the splint and patient was immediately returned to sitting and splint doffed to assess patient's ankle. No sign of injury noted and patient denied pain.    Therapeutic Activity: Bed Mobility: Patient performed supine to sit with mod A for trunk support and R LE management. Provided verbal cues for rolling R then using L UE to push to sit up using bed rail. Transfers: Patient performed stand pivot bed>BSC and sit to/from stand from [facility] with w/c replaced behind her to sit with mod A while blocking R knee and foot due to extensor tone. Provided verbal cues for sequencing, initiation, and hand placement. Patient was incontinent of bowl in incontinence brief prior to making to the [facility] then continent of bowl on BSC. Required second person A for peri-care and LB dressing with total A.   Reviewed memory sheet per patient's request. Patient wrote down what she had for breakfast and what she did with PT this morning with min A for memory and spelling.   Patient in w/c with R UE elevated on lap tray and pillow at end of session with breaks locked, seat belt alarm set, and all needs within reach.   Session 2: Patient in w/c upon PT arrival. Patient alert and agreeable to PT session. Patient denied pain during session, reported increased fatigue and soreness in R UE and LE from tone. Focused session on stretching and positioning of R UE/LE.   Therapeutic Activity: Bed Mobility: Patient performed sit to supine with mod A for trunk and LE support. Provided verbal cues for bringing knees to chest to lift LEs onto the bed. She performed rolling R/L for chuck pad and heating pad placement with min A and use of bed rails and scooting up  in bed  With max-total A using L LE to assist with pushing up with cues. Transfers: Patient performed stand pivot w/c>bed with mod A and manual assist for R LE management in pivot due to extensor tone. Provided verbal cues for hand placement, foot placement, and sequencing.  Performed prolonged UE elbow extension stretch ~2 min, progressed to shoulder flexion and abduction x2 min, very tight pectoral muscles and pain at end range.  Limited to ~90 degrees shoulder flexion and 25-35 degrees abduction. Performed DF stretch and placement of adjustable night splint to R LE with patient in lying, required increased time due to increased tone this session. Placed elbow extension soft splint to R elbow and elevated R UE on a pillow. WHO placed to R hand with towel roll on medial side to reduce IR.   Patient in bed at end of session with breaks locked, bed alarm set, and all needs within reach.    Therapy Documentation Precautions:  Precautions Precautions: Fall Precaution Comments: Increased tone RUE (flexion)/RLE(extension) Required Braces or Orthoses: Cervical Brace Cervical Brace: Soft collar, For comfort Restrictions Weight Bearing Restrictions: No   Therapy/Group: Individual Therapy  Emori Kamau L Kealan Buchan PT, DPT  02/04/2020, 12:21 PM

## 2020-02-04 NOTE — Progress Notes (Signed)
[facility] PHYSICAL MEDICINE & REHABILITATION PROGRESS NOTE   Subjective/Complaints: Had a pretty good night. I talked about her back pain, but she did not bring it up. C/o of fullness in ears.   ROS: Patient denies fever, rash, sore throat, blurred vision, nausea, vomiting, diarrhea, cough, shortness of breath or chest pain,   headache, or mood change.   Objective:   No results found. Recent Labs    02/01/20 1057 02/03/20 0434  WBC  --  6.2  HGB 13.7 11.5*  HCT 38.8 32.7*  PLT  --  149*   Recent Labs    02/03/20 0434 02/04/20 0652  NA 135 134*  K 3.3* 3.8  CL 104 102  CO2 23 23  GLUCOSE 98 100*  BUN 9 9  CREATININE 0.72 0.69  CALCIUM 8.4* 8.7*    Intake/Output Summary (Last 24 hours) at 02/04/2020 0952 Last data filed at 02/04/2020 0846 Gross per 24 hour  Intake 893 ml  Output 1100 ml  Net -207 ml     Physical Exam: Vital Signs Blood pressure (!) 146/65, pulse 80, temperature 99.5 F (37.5 C), temperature source Oral, resp. rate 16, height 5\' 2"  (1.575 m), weight 62.6 kg, last menstrual period 06/27/1972, SpO2 95 %. Constitutional: No distress . Vital signs reviewed. HEENT: EOMI, oral membranes moist Neck: supple Cardiovascular: RRR without murmur. No JVD    Respiratory/Chest: CTA Bilaterally without wheezes or rales. Normal effort    GI/Abdomen: BS +, non-tender, non-distended Ext: no clubbing, cyanosis, or edema Psych: pleasant and cooperative Skin: Clean and intact without signs of breakdown Neuro: very alert. Oriented to person, place, reason she's here. Language norma, speech intact.. right central 7. Right inattention. RUE and RLE 0/5. LUE/LLE 4-4+/5 prox to distal. Senses pain in right arm and leg.  MusculoskeletalRight heel cord contracture-but could reduce to -5 to -10, mild low back pain.        Assessment/Plan: 1. Functional deficits secondary to left hemispheric ICH which require 3+ hours per day of interdisciplinary therapy in a comprehensive  inpatient rehab setting.  Physiatrist is providing close team supervision and 24 hour management of active medical problems listed below.  Physiatrist and rehab team continue to assess barriers to discharge/monitor patient progress toward functional and medical goals  Care Tool:  Bathing    Body parts bathed by patient: Chest, Abdomen, Front perineal area, Face, Right arm, Right upper leg, Left upper leg   Body parts bathed by helper: Left arm, Buttocks, Right lower leg, Left lower leg Body parts n/a: Right upper leg, Left upper leg, Right lower leg, Left lower leg   Bathing assist Assist Level: Moderate Assistance - Patient 50 - 74%     Upper Body Dressing/Undressing Upper body dressing   What is the patient wearing?: Hospital gown only    Upper body assist Assist Level: Maximal Assistance - Patient 25 - 49%    Lower Body Dressing/Undressing Lower body dressing      What is the patient wearing?: Pants     Lower body assist Assist for lower body dressing: Maximal Assistance - Patient 25 - 49%     Toileting Toileting    Toileting assist Assist for toileting: Maximal Assistance - Patient 25 - 49%     Transfers Chair/bed transfer  Transfers assist     Chair/bed transfer assist level: 2 Helpers     Locomotion Ambulation   Ambulation assist   Ambulation activity did not occur: Safety/medical concerns (decreased motor control, R hemi)  Walk 10 feet activity   Assist  Walk 10 feet activity did not occur: Safety/medical concerns        Walk 50 feet activity   Assist Walk 50 feet with 2 turns activity did not occur: Safety/medical concerns         Walk 150 feet activity   Assist Walk 150 feet activity did not occur: Safety/medical concerns         Walk 10 feet on uneven surface  activity   Assist Walk 10 feet on uneven surfaces activity did not occur: Safety/medical concerns         Wheelchair     Assist Will  patient use wheelchair at discharge?: Yes (Per PT long-term goals) Type of Wheelchair: Manual Wheelchair activity did not occur: Safety/medical concerns (unable with and without skilled intervention due to poor motor planning and attention)         Wheelchair 50 feet with 2 turns activity    Assist    Wheelchair 50 feet with 2 turns activity did not occur: Safety/medical concerns       Wheelchair 150 feet activity     Assist  Wheelchair 150 feet activity did not occur: Safety/medical concerns       Blood pressure (!) 146/65, pulse 80, temperature 99.5 F (37.5 C), temperature source Oral, resp. rate 16, height 5\' 2"  (1.575 m), weight 62.6 kg, last menstrual period 06/27/1972, SpO2 95 %.  Medical Problem List and Plan: 1. 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             -patient may shower             -ELOS/Goals: 28-32 days/Min A             -continue CIR therapies with PT, OT, SLP  - adjustable tension night-splint/dorsi-flexion splint to address right heel cord contracture 2.  Antithrombotics: -DVT/anticoagulation:  Mechanical: Sequential compression devices, below knee Bilateral lower extremities             -antiplatelet therapy: None d/t hemorrhage 3. Chronic neck pain/Pain Management: Uses  Hydrocodone prn at home-->Continue to hold given AMS  -schedule tylenol 500mg  tid  -kpad  -discussed with the patient that I want to avoid neuro-sedating meds if possible given her ICH 4. Mood: LCSW to follow for evaluation and support.              -antipsychotic agents: N/A 5. Neuropsych: This patient is not capable of making decisions on her own behalf. 6. Skin/Wound Care: Routine pressure relief measures.  7.  Fluids/Electrolytes/Nutrition:   -labs improved today, K+ 3.8, BUN down  -dc IVF  -po intake picking up with improved cognition  8. HTN: Monitor BP tid             Verapamil and lisinopril.              reasonable control until this morning's reading. Continue to monitor for pattern Vitals:   02/03/20 1924 02/04/20 0557  BP: (!) 155/69 (!) 146/65  Pulse: 78 80  Resp: 16 16  Temp: 98.2 F (36.8 C) 99.5 F (37.5 C)  SpO2: 97% 95%  Hypertension controlled 8/10 9. Kleb pneumonia UTI: ceftriaxone x3 days and now on keflex to complete 7 day course. abx completed         -  low grade temp, re-check ucx 10. Delirum/AMS:  .   - resolving  -continue 12mg  seroquel at HS    11. Dysphagia:   -D3/thins, advance per SLP   13.  Small volume rectal bleeding per nursing after disimpaction, H&H stable.   Likely traumatic due to hard stool  -increased stool softeners 14.  Otitis externa symptomatic on the right side: Corticosporin drops   LOS: 6 days A FACE TO FACE EVALUATION WAS PERFORMED  Meredith Staggers 02/04/2020, 9:52 AM

## 2020-02-04 NOTE — Progress Notes (Signed)
Occupational Therapy Session Note  Patient Details  Name: Alison Cuevas MRN: [id number] Date of Birth: 12-Oct-1945  Today's Date: 02/04/2020 OT Individual Time: 1500-1533 OT Individual Time Calculation (min): 33 min    Short Term Goals: Week 1:  OT Short Term Goal 1 (Week 1): Pt will recall hemi dressing techniques with min VC OT Short Term Goal 2 (Week 1): Pt will visually attend to R arm during bathing tasks to demo  improve R inattention OT Short Term Goal 3 (Week 1): Pt will transfer to [facility] with MAX A of 1 caregiver to decrease BOC for toileting OT Short Term Goal 4 (Week 1): Pt will locate 2/2 grooming items on R of sink for improved R inattention  Skilled Therapeutic Interventions/Progress Updates:    patient in bed, alert and ready for therapy.  She denies pain.  Rolling right CS, side lying to sitting edge of bed with min A.  Cues for unsupported sitting bed side with CS/CGA.  She has a tendency to lean right but able to correct with tactile or verbal cues.  Completed trunk mobility and posture exercises in sitting position, lateral leans and weight bearing.  Sit to stand at edge of bed with mod A of one, min A of 2nd person - completed weight shift and stepping activity with max A.  Returned to bed due to incontinence of bowel - max A to change soiled brief, rolling left min/mod A, rolling right CS.  She remained in bed at close of session.  Right elbow extension soft splint and comfy hand splint applied - note increased IR at shoulder with intervention at elbow.  Right foot and ankle with good ROM and positioning noted in sitting edge of bed and in stance - attempted to place splint for dorsiflexion but unable to achieve neutral position due to increased plantar flexion and rotation at ankle with attempts to place - better postioning without device (reviewed with PT)    She remained in bed at close of session, bed alarm set and call bell in reach.    Therapy Documentation Precautions:   Precautions Precautions: Fall Precaution Comments: Increased tone RUE (flexion)/RLE(extension) Required Braces or Orthoses: Cervical Brace Cervical Brace: Soft collar, For comfort Restrictions Weight Bearing Restrictions: No  Therapy/Group: Individual Therapy  Carlos Levering 02/04/2020, 7:45 AM

## 2020-02-04 NOTE — Patient Care Conference (Signed)
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 Alger Simons Care Coodinator Present: Loralee Pacas, LCSWA;Layney Gillson Creig Hines, RN, BSN, [place] Nurse Present: Rayne Du, LPN PT Present: Apolinar Junes, PT OT Present: Laverle Hobby, OT SLP Present: Jettie Booze, CF-SLP PPS Coordinator present : Ileana Ladd, Burna Mortimer, SLP     Current Status/Progress Goal Weekly Team Focus  Bowel/Bladder   (P) Pt is incontinent of b/b, pt requires 4-6h bladder scan with in/out cath.  (P) Pt will be continent of b/b  (P) Q2h toileting, In/out cath per order.   Swallow/Nutrition/ Hydration   Dys 3, Thin full supervision A  supervision A  trials of regular solids, improve oral clearance of residuals   ADL's   Max A UB dressing, max A LB dressing, mod-max A stand pivot transfers. Flexor tone RUE, extensor tone RLE. No active movement palpated in RUE elbow, hand, wrist  min A overall for ADLs  RUE NMR, ADL transfers, ADL retraining, cognitive retraining, functional activity tolerance   Mobility   Mod A transfers and bed mobility, has not initiated gait at this time  Min A overall, gait 50 ft  Weight bearing for PF tone managment, functional mobility, activity tolerance, initiating gait training, balance, midline orientation, R UE/LE NMR   Communication             Safety/Cognition/  Behavioral Observations  mod A for: problem solving, memory, attention  min A basic problem solving; min A memory recall; min A sustained attention  using external/visual aids for memory, starting memory notebook, functional problem solving and attention tasks.   Pain   (P) Pt c/o generalized pain at this time. PRN tylenol effective  (P) Pt will be pain free  (P) Assess pain qshift/prn   Skin               Discharge Planning:  Discharge to home with husband, daughter available to provide care for mother at discharge.   Team Discussion: Incontinent B/B, I&O caths required. MD put ordered in for UA sample. Min assist goals. No palpated movement on the left. Tolerates weight bearing however, shoes are better for transfers. Mod/min assist bed mobility, mod/min assist for standing. Pt has a right lean when standing. Dys3/thins full supervision with a min assist goal. Patient on target to meet rehab goals: yes  *See Care Plan and progress notes for long and short-term goals.   Revisions to Treatment Plan:  none  Teaching Needs: Family education as recommended by therapy.  Current Barriers to Discharge: Incontinence, Lack of/limited family support and Weight bearing restrictions  Possible Resolutions to Barriers: Time toilet patient q 2 hr. Continue working with pt and family on weight bearing restrictions. Daughter is currently unemployed and may have to return to work before the end of the year.     Medical Summary Current Status: ICH stable. improved cognitive status  and sleep, still retaining urine. low grade temp today  Barriers to Discharge: Medical stability   Possible Resolutions to Barriers/Weekly Focus: tone mgt, orthotics, topical modalities for pain. re-check ucx   Continued Need for Acute Rehabilitation Level of Care: The patient requires daily medical management by a physician with specialized training in physical medicine and rehabilitation for the following  reasons: Direction of a multidisciplinary physical rehabilitation program to maximize functional independence : Yes Medical management of patient stability for increased activity during participation in an intensive rehabilitation regime.: Yes Analysis of laboratory values and/or radiology reports with any subsequent need for medication adjustment and/or medical intervention. : Yes   I attest that I was present, lead the team conference, and concur with the assessment and plan of the team.   Cristi Loron 02/04/2020, 2:41 PM

## 2020-02-04 NOTE — Progress Notes (Signed)
Patient ID: Delesa Kawa, female   DOB: 01-01-1946, 74 y.o.   MRN: [id number]  SW met with pt in room to provide updates from team conference, and d/c date 9/2. Pt encourages SW to follow-up with her dtr Judie Petit to provide updates.   SW called pt dtr [name] to provide updates on pt d/c date. SW to follow-up tomorrow to give more details on d/c details.   Loralee Pacas, MSW, [place] Office: [phone] Cell: [phone] Fax: [phone]

## 2020-02-04 NOTE — Progress Notes (Signed)
Speech Language Pathology Daily Session Note  Patient Details  Name: Alison Cuevas MRN: [id number] Date of Birth: 1946/02/16  Today's Date: 02/04/2020 SLP Individual Time: 1332-1430 SLP Individual Time Calculation (min): 58 min  Short Term Goals: Week 1: SLP Short Term Goal 1 (Week 1): Pt will consume trials of regular textures demonstrating efficient mastication and oral clearance across 2 sessions with no more than min A cues for oral clearance of residue. SLP Short Term Goal 2 (Week 1): Pt will sustain attention to functional tasks for 5 min with mod A verbal cues for redirection. SLP Short Term Goal 3 (Week 1): Pt will demonstrate recall of how to use call bell for assistance with Max A verbal cues. SLP Short Term Goal 4 (Week 1): Pt will demonstrate ability to problem solve basic familiar tasks with Max A verbal cues.  Skilled Therapeutic Interventions: Skilled ST services focused on cognitive skills. Pt had written events from today to aid in daily recall, however due to changes in handwriting had difficulty reading information. SLP created a memory notebook and recommending therapist write in notebook verse pt for legibility. SLP facilitated basic problem solving skills when sorting coins from ALFA with supervision A verbal cues, but required total A to display requested amount. Pt supported difficulty due to blurred vision from acte CVA. SLP attempted to continue to complete subsections of CLQT, initiated in pervious session with SLP however pt was unable to complete symbol cancellation and symbol trial tasks due to poor recall and sustained attention. SLP continued to assess cognitive linguistic skills administering SLUMS pt scored 10 out 30 (n=> 20 reflecting education level) with impairments in sustained attention, immediate/short term recall, error awareness and problem solving. Pt drew the numbers in the clock backwards, but was able to correct error once recognized. Pt was left in room  with call bell within reach and bed alarm set. ST recommends to continue skilled ST services.      Pain Pain Assessment Pain Score: 0-No pain  Therapy/Group: Individual Therapy  Alison Cuevas  [facility] 02/04/2020, 4:08 PM

## 2020-02-04 NOTE — Progress Notes (Signed)
Occupational Therapy Session Note  Patient Details  Name: Alison Cuevas MRN: [id number] Date of Birth: 05-08-1946  Today's Date: 02/04/2020 OT Individual Time: 1030-1100 OT Individual Time Calculation (min): 30 min    Short Term Goals: Week 1:  OT Short Term Goal 1 (Week 1): Pt will recall hemi dressing techniques with min VC OT Short Term Goal 2 (Week 1): Pt will visually attend to R arm during bathing tasks to demo  improve R inattention OT Short Term Goal 3 (Week 1): Pt will transfer to [facility] with MAX A of 1 caregiver to decrease BOC for toileting OT Short Term Goal 4 (Week 1): Pt will locate 2/2 grooming items on R of sink for improved R inattention  Skilled Therapeutic Interventions/Progress Updates:    Pt received sitting in the w/c with c/o 10/10 pain in her back with activity, stating it alleviated with rest. Pt completed stand pivot transfer to the [facility] with max A, +2 assist for safety and equipment. Pt required total A for clothing management. Pt's RLE required blocking at the knee and support at the ankle to prevent inversion. Pt voided BM on the commode. Pt completed UB bathing seated on BSC with mod A overall to doff/don gown and to wash UB. Pt required extensive cueing to lift RUE and to bring into forward flexion. Pt stood with max A for total A peri hygiene in standing. Pt completed stand pivot transfer to the w/c toward the L with max A. Pt left sitting up with all needs met, chair alarm set.   Therapy Documentation Precautions:  Precautions Precautions: Fall Precaution Comments: Increased tone RUE (flexion)/RLE(extension) Required Braces or Orthoses: Cervical Brace Cervical Brace: Soft collar, For comfort Restrictions Weight Bearing Restrictions: No  Therapy/Group: Individual Therapy  Curtis Sites 02/04/2020, 6:50 AM

## 2020-02-05 ENCOUNTER — Inpatient Hospital Stay (HOSPITAL_COMMUNITY): Payer: Medicare Other

## 2020-02-05 ENCOUNTER — Encounter (HOSPITAL_COMMUNITY): Payer: Medicare Other | Admitting: Psychology

## 2020-02-05 ENCOUNTER — Inpatient Hospital Stay (HOSPITAL_COMMUNITY): Payer: Medicare Other | Admitting: *Deleted

## 2020-02-05 DIAGNOSIS — F411 Generalized anxiety disorder: Secondary | ICD-10-CM

## 2020-02-05 LAB — URINE CULTURE: Culture: NO GROWTH

## 2020-02-05 MED ORDER — HYDROCODONE-ACETAMINOPHEN 5-325 MG PO TABS
0.5000 | ORAL_TABLET | Freq: Every day | ORAL | Status: DC
  Administered 2020-02-05: 0.5 via ORAL
  Filled 2020-02-05: qty 1

## 2020-02-05 NOTE — Progress Notes (Signed)
Occupational Therapy Session Note  Patient Details  Name: Alison Cuevas MRN: [id number] Date of Birth: 10-24-1945  Today's Date: 02/05/2020 OT Individual Time: 1030-1130 OT Individual Time Calculation (min): 60 min    Short Term Goals: Week 1:  OT Short Term Goal 1 (Week 1): Pt will recall hemi dressing techniques with min VC OT Short Term Goal 2 (Week 1): Pt will visually attend to R arm during bathing tasks to demo  improve R inattention OT Short Term Goal 3 (Week 1): Pt will transfer to [facility] with MAX A of 1 caregiver to decrease BOC for toileting OT Short Term Goal 4 (Week 1): Pt will locate 2/2 grooming items on R of sink for improved R inattention  Skilled Therapeutic Interventions/Progress Updates:    Pt received supine with no c/o pain, frustrated with no recent urination. Pt agreeable to OT session. Pt completed bed mobility to EOB with mod A, mod cueing overall. Pt sat EOB with min cueing for balance stabilization and midline orientation, but pt able to maintain with CGA. Hair care provided EOB while pt sat unsupported. Pt completed squat pivot transfer to the w/c with mod A. Pt pulled up to the sink in her w/c and completed oral care and face washing with set up assist. Min A required to dry hand. Pt was taken via w/c to the therapy gym. Pt completed sit >stand with mod A. In standing, pt completed weight shift L and R, with focus on L side to reduce R pushing. Pt's RUE was placed in weightbearing on a yoga block on the table and required max facilitation to block at the elbow, as well as RLE blocking at the knee. Pt demonstrated improved pushing behaviors overall. Pt was also able to demonstrate 75% R elbow flexion volitionally when arm was passively brought into extension. Posey air splint applied to pt's R elbow for contracture prevention. Pt returned to her room and was left sitting up with all needs met, chair alarm set.   Therapy Documentation Precautions:  Precautions Precautions:  Fall Precaution Comments: Increased tone RUE (flexion)/RLE(extension) Required Braces or Orthoses: Cervical Brace Cervical Brace: Soft collar, For comfort Restrictions Weight Bearing Restrictions: No  Therapy/Group: Individual Therapy  Curtis Sites 02/05/2020, 6:51 AM

## 2020-02-05 NOTE — Progress Notes (Signed)
Physical Therapy Session Note  Patient Details  Name: Alison Cuevas MRN: [id number] Date of Birth: 06/15/1946  Today's Date: 02/05/2020 PT Individual Time: 0805-0903 PT Individual Time Calculation (min): 58 min   Short Term Goals: Week 1:  PT Short Term Goal 1 (Week 1): Patient will perform bed mobility with mod A. PT Short Term Goal 2 (Week 1): Patient will perform basic transfers with mod A using LRAD. PT Short Term Goal 3 (Week 1): Patient will initate gait training. PT Short Term Goal 4 (Week 1): Patient will perform standing activities >5 min focused on weight bearing for R LE tone management.  Skilled Therapeutic Interventions/Progress Updates:     Patient in [place] with NTs preparing for toileting upon PT arrival. Patient alert and agreeable to PT session. Patient denied pain during session.  Therapeutic Activity: Transfers: Patient performed sit to/from stand x4 in the [place] with CGA progressing to close supervision. She also performed sit to from/stand holding a L rail x2 with min A, requiring total A for R foot placement and blocking R knee for safety. Provided verbal cues for hand placement, foot placement, forward and L weight shift to reduced mild posterior R lean in standing, and reaching back for controlled descent from the rail. Patient was continent of bowl during session. Required total A for peri-care and donning incontinence breif and scrub pants, with patient's permission.   Gait Training:  Patient ambulated 10 feet and 15 feet using L rail with mod A and max-mod A for R LE advancement and placement. Ambulated with step-to gait pattern leading with R, decreased R DF, mild early R heel lift, and delayed R quad activation in stance. Provided multimodal cues for sequencing, R LE advancement, R quad activation in stance, and looking ahead using a mirror for visual feedback. Patient had increased difficulty with R limb advancement and sequencing in a busy environment due to  decreased attention.  Wheelchair Mobility:  Patient was transported in the w/c with total A throughout session for energy conservation and time management.  Neuromuscular Re-ed: Patient performed the following standing balance with R LE and R UE motor control activities: Standing in the [place] 2-3 min x4 focuses on R hand placement on bar with HOH assist, patient able to sustain grip throughout all trials, provided multi-modal cues for midline orientation, R heel contact with manual A x1 then without assist on other trials, and increased R quad and gluteal activation in standing  Patient in w/c with R UE elevated and handed off to [place], RT, at end of session with breaks locked, seat belt alarm set, and all needs within reach.    Therapy Documentation Precautions:  Precautions Precautions: Fall Precaution Comments: Increased tone RUE (flexion)/RLE(extension) Required Braces or Orthoses: Cervical Brace Cervical Brace: Soft collar, For comfort Restrictions Weight Bearing Restrictions: No   Therapy/Group: Individual Therapy  Hansen Carino L [name] PT, DPT  02/05/2020, 12:24 PM

## 2020-02-05 NOTE — Consult Note (Signed)
Neuropsychological Consultation   Patient:   Alison Cuevas   DOB:   12/20/1945  MR Number:  [id number]  Location:  [facility] [place] [id number] [place] [address] Dept: [place]: [phone]           Date of Service:   02/05/2020  Start Time:   1 PM End Time:   2 PM  Provider/Observer:  Ilean Skill, Psy.D.       Clinical Neuropsychologist       Billing Code/Service: [phone]  Chief Complaint:    Alison Cuevas is a 74 year old left-handed female with history of hypertension, anxiety disorder, degenerative disc disease with chronic pain. Patient with a past medical history including perforated right tympanic membrane patient was admitted to [facility] with sudden onset of right hemiparesis and numbness on 01/26/2020 that developed while she was working in her garden. CT of head showed IPH left frontoparietal convexity with subdural extension and 4 mm left to right midline shift. Patient was transferred to [facility] for management. CT head was negative for LVO or significant stenosis or AVM. Urine drug screen was negative for opiates but positive for benzodiazepines with the patient having a prescription for Xanax. Follow-up MRI showed stable large left IPH. The patient had times of lethargy and agitation which has been improving and right visual field deficits with right inattention, delay in processing, right hemiplegia.  Reason for Service:  Patient was referred for neuropsychological consultation due to coping and adjustment issues and increase in anxiety. Patient has a past history of anxiety with treatment utilizing benzodiazepines in the past. Below see HPI for the current admission.  HPI: Alison Cuevas is a 74 year old [place] female with history of HTN, anxiety d/o, DDD with chronic pain who [facility] with sudden onset of right hemiparesis and numbness on  01/26/20 that developed while she was working in her garden.  History taken from daughter and chart review due to cognition. CT of head done showing IPH left frontoparietal convexity with subdural extension and 4 mm left-to-right midline shift.  She was transferred to [facility] for management and Cleviprex added for BP greater than 140.  CTA head was negative for LVO or significant stenosis or AVM.  CT venogram was negative for dural sinus thrombosis.  UDS negative for opiates but positive for benzodiazapines.  She also had issues with delirium even past admission requiring IV Haldol.    Dr. Leonie Man felt that bleed was hypertensive in nature and aspirin was discontinued.  Follow-up MRI brain personally reviewed, showing stable large left IPH.  Per report, unchanged 3.6 cm IPH in superior left hemisphere with moderate edema and small foci of acute ischemia within right MCA/PCA watershed zone.  Echocardiogram with EF of 65-70%. Normal LVH and no wall abnormality.  Patient has had issues with right extremity pain as well as spasms due to increase in tone.  Baclofen was added yesterday to help with spasticity.  Family also expresses some concerns of mild lethargy yesterday therefore urine culture was ordered and positive for Klebsiella.  Therefore started on ceftriaxone today.  She is noted to be more lethargic with family reporting agitation as well as confusion today.  Therapy evaluations revealed right visual field deficits with right inattention, delay in processing, right hemiplegia with tone as well as pusher tendency affecting ADLs and mobility.  CIR was recommended due to functional decline. Please see  preadmission assessment from earlier today as well.  Current Status:  Patient was oriented and globally aware of what it happened with her describing it as a stroke versus a bleed. The patient was oriented to place and situation. The patient describes significant increase in her anxiety and that when  she is alone in her room that she will ruminate over potential long-term residual effects of her CVA. The patient reports that she has made significant improvements over the past week but anxiety continues to be problematic. The patient is highly motivated to actively work on the therapeutic process and reports that she has seen functional gains with these efforts.  Behavioral Observation: Alison Cuevas  presents as a 74 y.o.-year-old Left Caucasian Female who appeared her stated age. her dress was Appropriate and she was Well Groomed and her manners were Appropriate to the situation.  her participation was indicative of Appropriate and Redirectable behaviors.  There were any physical disabilities noted.  she displayed an appropriate level of cooperation and motivation.     Interactions:    Active Appropriate  Attention:   abnormal and attention span appeared shorter than expected for age  Memory:   within normal limits; recent and remote memory intact  Visuo-spatial:  On not formally evaluated today the patient has reports of visual inattention particularly for right visual field deficits.  Speech (Volume):  normal  Speech:   normal; normal  Thought Process:  Coherent and Relevant  Though Content:  Rumination; not suicidal and not homicidal  Orientation:   person, place, time/date and situation  Judgment:   Fair  Planning:   Poor  Affect:    Anxious  Mood:    Anxious  Insight:   Fair  Intelligence:   normal  Medical History:   Past Medical History:  Diagnosis Date  . Allergic rhinitis    maple pollen  . Anxiety   . Arthritis   . Bowel obstruction ([place])   . Chronic low back pain    Dr. Trenton Gammon.  Has had back injection--bp elevated after.  . Chronic pain syndrome   . Chronic renal insufficiency, stage 2 (mild)    GFR 60-70  . DDD (degenerative disc disease), cervical    Hx of ACDF (Dr. [name]).  Followed by Dr. [name].  Also, Dr. [name] do left  C7-T1 intralaminal epidural injection.  . Diverticulosis of colon   . DJD (degenerative joint disease)   . Gallstones   . GERD (gastroesophageal reflux disease)   . Herpes zoster 07/08/2014  . 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 Dense R arm and leg hemiplegia.  Marland Kitchen Hypercholesteremia    mild; pt declined statin trial 05/2014--needs recheck lipid panel at first f/u visit in 2017  . Hypertension   . Osteopenia 06/2017   T score -1.4 FRAX 15% / 2%  . Ovarian cyst 2017   82019-robotic assisted bilatel SPO: all path benign.  . Perianal dermatitis    prn cutivate  . Phlebitis   . 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 Right knee injury 2018   Patellofemoral crush injury--Dr. [name].  . Trochanteric bursitis of right hip    Recurrent (injection by Dr. [name] 05/26/15)  . Tympanic membrane rupture, right 12/01/2016   As of 08/2018 pt set for tympanomastoidectomy and STSG (Dr. [name]). Recurrent fungal/bact OE.  . Varicose vein    left leg  Psychiatric History:  Patient has a prior psychiatric history including significant anxiety as well as chronic pain difficulties.  Family Med/Psych History:  Family History  Problem Relation Age of Onset  . Hypertension Mother   . Diverticulitis Mother   . Breast cancer Sister 61  . Melanoma Brother   . Prostate cancer Brother   . Leukemia Brother   . Lupus Sister   . Lung disease Brother   . Stomach cancer Father   . Other Other        Family member with MGUS   Impression/DX:  Sariya Trickey is a 74 year old left-handed female with history of hypertension, anxiety disorder, degenerative disc disease with chronic pain. Patient with a past medical history including perforated right tympanic membrane patient was admitted to [facility] with sudden onset of right hemiparesis and numbness on 01/26/2020 that  developed while she was working in her garden. CT of head showed IPH left frontoparietal convexity with subdural extension and 4 mm left to right midline shift. Patient was transferred to [facility] for management. CT head was negative for LVO or significant stenosis or AVM. Urine drug screen was negative for opiates but positive for benzodiazepines with the patient having a prescription for Xanax. Follow-up MRI showed stable large left IPH. The patient had times of lethargy and agitation which has been improving and right visual field deficits with right inattention, delay in processing, right hemiplegia.  Patient was oriented and globally aware of what it happened with her describing it as a stroke versus a bleed. The patient was oriented to place and situation. The patient describes significant increase in her anxiety and that when she is alone in her room that she will ruminate over potential long-term residual effects of her CVA. The patient reports that she has made significant improvements over the past week but anxiety continues to be problematic. The patient is highly motivated to actively work on the therapeutic process and reports that she has seen functional gains with these efforts.  Disposition/Plan:  Today we worked on coping and adjustment issues particular around her increase in anxiety with recent CVA and extended hospital stay. The patient is aware that she has a anticipated discharge date of 9/2 and while she understands the need for these therapeutic interventions she still is worried about long-term effects. Patient also requested that I speak with her children and we are setting up to have that discussion the first of next week. I will follow up with the patient first of next week.  Diagnosis:    ICH, Anxiety         Electronically Signed   _______________________ Ilean Skill, Psy.D.

## 2020-02-05 NOTE — Progress Notes (Signed)
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 Allergic rhinitis    maple pollen  . Anxiety   . Arthritis   . Bowel obstruction ([place])   . Chronic low back pain    Dr. Trenton Gammon.  Has had back injection--bp elevated after.  . Chronic pain syndrome   . Chronic renal insufficiency, stage 2 (mild)    GFR 60-70  . DDD (degenerative disc disease), cervical    Hx of ACDF (Dr. [name]).  Followed by Dr. [name].  Also, Dr. [name] do left C7-T1 intralaminal epidural injection.  . Diverticulosis of colon   . DJD (degenerative joint disease)   . Gallstones   . GERD (gastroesophageal reflux disease)   . Herpes zoster 07/08/2014  . Hypercholesteremia    mild; pt declined statin trial 05/2014--needs recheck lipid panel at first f/u visit in 2017  . Hypertension   . Osteopenia 06/2017   T score -1.4 FRAX 15% / 2%  . Ovarian cyst 2017   82019-robotic assisted bilatel SPO: all path benign.  . Perianal dermatitis    prn cutivate  . Phlebitis   . Right knee injury 2018   Patellofemoral crush injury--Dr. [name].  . Trochanteric bursitis of right hip    Recurrent (injection by Dr. [name] 05/26/15)  . Tympanic membrane rupture, right 12/01/2016   As of 08/2018 pt set for tympanomastoidectomy and STSG (Dr. [name]). Recurrent fungal/bact OE.  . Varicose vein    left leg    Past Surgical History:       Past Surgical History:  Procedure Laterality Date  . ABDOMINAL HYSTERECTOMY  03/2015   TAH.  Last pap 2016.  No hx of abnl paps.  Per GYN, no further pap smears indicated.  . ANTERIOR AND POSTERIOR REPAIR N/A  03/18/2014   Procedure: Cystocele repair with graft, Vault suspension, Rectocele repair;  Surgeon: [name], MD;  Location: [facility];  Service: Urology;  Laterality: N/A;  . CHOLECYSTECTOMY    . CHOLECYSTECTOMY, LAPAROSCOPIC    . COLON SURGERY    . COLONOSCOPY  04/2006; 05/26/16   2007 (Dr. [name]): Normal.  04/2016 (Dr. [name]) normal except diverticulosis and decreased anal sphincter tone.  No repeat colonoscopy is recommended due to age.  . cspine surgery     Dr. Wiliam Ke level ant cerv discectomy /fusion w/plating  . CYSTO N/A 03/18/2014   Procedure: CYSTO;  Surgeon: [name], MD;  Location: [facility];  Service: Urology;  Laterality: N/A;  . DEXA  07/30/2015; 06/2018   2017 and 2020 -->Osteopenia--repeat 2 yrs.  Marland Kitchen LYSIS OF ADHESION N/A 01/31/2018   Procedure: POSSIBLE LYSIS OF ADHESION;  Surgeon: Everitt Amber, MD;  Location: [facility];  Service: Gynecology;  Laterality: N/A;  . [place]     with prolasped bledder repair  . RESECTION OF COLON     BENIGN TUMOR  . RIGHT EAR SURGERY  08/23/2017   Dr. 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 right ear surgery  12/12/2018   Right tympanomastoidectomy, ossiculoplasty with partial prosthesis,  split thickness skin graft from postauricular skin 1x1cm, and right tragal cartilage graft 1x1 cm [facility])  . right hemicolectomy for diverticulitis with abscess  1993  . ROBOTIC ASSISTED BILATERAL SALPINGO OOPHERECTOMY Bilateral 01/31/2018   All PATH benign.  Procedure: XI ROBOTIC ASSISTED BILATERAL SALPINGO OOPHORECTOMY;  Surgeon: Everitt Amber, MD;  Location: [facility];  Service: Gynecology;  Laterality: Bilateral;  . TUBAL LIGATION      Assessment & Plan Clinical Impression: Patient is a 74 y.o. year old [place] female with history of HTN, anxiety d/o, DDD with chronic pain who  [facility] with sudden onset of right hemiparesis and numbness on 01/26/20 that developed while she was working in her garden. History taken from daughter and chart review due to cognition. CT of head done showing IPH left frontoparietal convexity with subdural extension and 4 mm left-to-right midline shift. She was transferred to [facility] for management and Cleviprex added for BP greater than 140. CTA head was negative for LVO or significant stenosis or AVM. CT venogram was negative for dural sinus thrombosis. UDS negative for opiates but positive for benzodiazapines. She also had issues with delirium even past admission requiring IV Haldol.   Dr. Leonie Man felt that bleed was hypertensive in nature and aspirin was discontinued. Follow-up MRI brain personally reviewed, showing stable large left IPH. Per report, unchanged 3.6 cm IPH in superior left hemisphere with moderate edema and small foci of acute ischemia within right MCA/PCA watershed zone. Echocardiogram with EF of 65-70%. Normal LVH and no wall abnormality. Patient has had issues with right extremity pain as well as spasms due to increase in tone. Baclofen was added yesterday to help with spasticity. Family also expresses some concerns of mild lethargy yesterday therefore urine culture was ordered and positive for Klebsiella. Therefore started on ceftriaxone today. She is noted to be more lethargic with family reporting agitation as well as confusion today. Therapy evaluations revealed right visual field deficits with right inattention, delay in processing, right hemiplegia with tone as well as pusher tendency affecting ADLs and mobility. CIR was recommended due to functional decline. Please see preadmission assessment from earlier today as well. Patient transferred to CIR on 01/29/2020 .   Pt presents with decreased activity tolerance, decreased functional mobility, decreased balance, decreased coordination,  decreased attention, decreased awareness, decreased problem solving, decreased safety awareness, decreased memory and delayed processing Limiting pt's independence with leisure/community pursuits.  Plan    Min 1 TR session >25 per week during LOS  Recommendations for other services: None   Discharge Criteria: Patient will be discharged from TR if patient refuses treatment 3 consecutive times without medical reason.  If treatment goals not met, if there is a change in medical status, if patient makes no progress towards goals or if patient is discharged from hospital.  The above assessment, treatment plan, treatment alternatives and goals were discussed and mutually agreed upon: by patient  [place] 02/05/2020, 11:38 AM

## 2020-02-05 NOTE — Progress Notes (Signed)
Patient ID: Alison Cuevas, female   DOB: 1945-10-24, 74 y.o.   MRN: [id number]  Per Dr. Sima Matas, pt has requested to have a family meeting on how to prepare for her overall when she comes home. SW to follow-up with family.  SW called pt dtr Alison Cuevas [phone]) to provide updates from team conference, and discuss pt request to meet with neuropsych on how to prepare for transition to home. She intends to speak with siblings and will follow-up with [place], MSW, [place] Office: [phone] Cell: [phone] Fax: [phone]

## 2020-02-05 NOTE — Progress Notes (Signed)
[facility]ICINE [facility] RE[facility]ABILITATION PROGRESS NOTE   Subjective/Complaints: Didn't sleep as well last night. Nurse reports spasms today. Pt told me that back was sore last night.   ROS: Patient denies fever, rash, sore throat, blurred vision, nausea, vomiting, diarrhea, cough, shortness of breath or chest pain,  headache, or mood change.    Objective:   No results found. Recent Labs    02/03/20 0434  WBC 6.2  HGB 11.5*  HCT 32.7*  PLT 149*   Recent Labs    02/03/20 0434 02/04/20 0652  NA 135 134*  K 3.3* 3.8  CL 104 102  CO2 23 23  GLUCOSE 98 100*  BUN 9 9  CREATININE 0.72 0.69  CALCIUM 8.4* 8.7*    Intake/Output Summary (Last 24 hours) at 02/05/2020 0846 Last data filed at 02/05/2020 0747 Gross per 24 hour  Intake 840 ml  Output 2025 ml  Net -1185 ml     Physical Exam: Vital Signs Blood pressure (!) 145/61, pulse 81, temperature 98.6 F (37 C), temperature source Oral, resp. rate 16, height 5\' 2"  (1.575 m), weight 62.8 kg, last menstrual period 06/27/1972, SpO2 94 %. Constitutional: No distress . Vital signs reviewed. HEENT: EOMI, oral membranes moist Neck: supple Cardiovascular: RRR without murmur. No JVD    Respiratory/Chest: CTA Bilaterally without wheezes or rales. Normal effort    GI/Abdomen: BS +, non-tender, non-distended Ext: no clubbing, cyanosis, or edema Psych: pleasant and cooperative Skin: Clean and intact without signs of breakdown Neuro: very alert. Oriented to person, place, reason she's here. Language norma, speech intact.. right central 7. Right inattention. RUE and RLE 0/5. LUE/LLE 4-4+/5 prox to distal. Senses pain in right arm and leg.  MusculoskeletalRight heel cord contracture still present-30 today, mild low back pain.        Assessment/Plan: 1. 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  Physiatrist is  providing close team supervision and 24 hour management of active medical problems listed below.  Physiatrist and rehab team continue to assess barriers to discharge/monitor patient progress toward functional and medical goals  Care Tool:  Bathing    Body parts bathed by patient: Chest, Abdomen, Front perineal area, Face, Right arm, Right upper leg, Left upper leg   Body parts bathed by helper: Left arm, Buttocks, Right lower leg, Left lower leg Body parts n/a: Right upper leg, Left upper leg, Right lower leg, Left lower leg   Bathing assist Assist Level: Moderate Assistance - Patient 50 - 74%     Upper Body Dressing/Undressing Upper body dressing   What is the patient wearing?: Hospital gown only    Upper body assist Assist Level: Maximal Assistance - Patient 25 - 49%    Lower Body Dressing/Undressing Lower body dressing      What is the patient wearing?: Pants     Lower body assist Assist for lower body dressing: Maximal Assistance - Patient 25 - 49%     Toileting Toileting    Toileting assist Assist for toileting: Maximal Assistance - Patient 25 - 49%     Transfers Chair/bed transfer  Transfers assist     Chair/bed transfer assist level: 2 Helpers     Locomotion Ambulation   Ambulation assist   Ambulation activity did not occur: Safety/medical concerns (decreased motor control, R hemi)          Walk 10 feet activity   Assist  Walk  10 feet activity did not occur: Safety/medical concerns        Walk 50 feet activity   Assist Walk 50 feet with 2 turns activity did not occur: Safety/medical concerns         Walk 150 feet activity   Assist Walk 150 feet activity did not occur: Safety/medical concerns         Walk 10 feet on uneven surface  activity   Assist Walk 10 feet on uneven surfaces activity did not occur: Safety/medical concerns         Wheelchair     Assist Will patient use wheelchair at discharge?: Yes (Per PT  long-term goals) Type of Wheelchair: Manual Wheelchair activity did not occur: Safety/medical concerns (unable with and without skilled intervention due to poor motor planning and attention)         Wheelchair 50 feet with 2 turns activity    Assist    Wheelchair 50 feet with 2 turns activity did not occur: Safety/medical concerns       Wheelchair 150 feet activity     Assist  Wheelchair 150 feet activity did not occur: Safety/medical concerns       Blood pressure (!) 145/61, pulse 81, temperature 98.6 F (37 C), temperature source Oral, resp. rate 16, height 5\' 2"  (1.575 m), weight 62.8 kg, last menstrual period 06/27/1972, SpO2 94 %.  Medical Problem List and Plan: 1. 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             -patient may shower             -ELOS/Goals: 28-32 days/Min A             -continue CIR therapies with PT, OT, SLP  - adjustable tension night-splint/dorsi-flexion splint to address right heel cord contracture 2.  Antithrombotics: -DVT/anticoagulation:  Mechanical: Sequential compression devices, below knee Bilateral lower extremities             -antiplatelet therapy: None d/t hemorrhage 3. Chronic neck pain/Pain Management: Uses  Hydrocodone prn at home-->Continue to hold given AMS  -schedule tylenol 500mg  tid  -kpad  -will try 1/2 hydrocodone at bedtime  -continue ROM RUE/RLE/splinting 4. Mood: LCSW to follow for evaluation and support.              -antipsychotic agents: N/A 5. Neuropsych: This patient is not capable of making decisions on her own behalf. 6. Skin/Wound Care: Routine pressure relief measures.  7. Fluids/Electrolytes/Nutrition:   -labs improved today, K+ 3.8, BUN down  -dc'ed IVF  -po  intake picking up with improved cognition  8. HTN: Monitor BP tid             Verapamil and lisinopril.              reasonable control until this morning's reading. Continue to monitor for pattern Vitals:   02/04/20 1929 02/05/20 0540  BP: (!) 155/59 (!) 145/61  Pulse: 79 81  Resp: 16 16  Temp: 98.8 F (37.1 C) 98.6 F (37 C)  SpO2: 95% 94%  Hypertension controlled 8/11 9. Kleb pneumonia UTI: ceftriaxone x3 days and now on keflex to complete 7 day course. abx completed         -low grade temp, re-check ucx 10. Delirum/AMS:  .   -  resolving  -continue 12mg  seroquel at HS    11. Dysphagia:   -D3/thins, advance per SLP   13.  Small volume rectal bleeding per nursing after disimpaction, H&H stable.   Likely traumatic due to hard stool  -increased stool softeners 14.  Otitis externa with wax build up on right  -hold Corticosporin drops  -debrox drops for 3 days   LOS: 7 days A FACE TO FACE EVALUATION WAS PERFORMED  Meredith Staggers 02/05/2020, 8:46 AM

## 2020-02-05 NOTE — Progress Notes (Signed)
Occupational Therapy Session Note  Patient Details  Name: Alison Cuevas MRN: [id number] Date of Birth: [birth year]  Today's Date: 02/05/2020 OT Individual Time: 1520-1600 OT Individual Time Calculation (min): 40 min    Short Term Goals: Week 1:  OT Short Term Goal 1 (Week 1): Pt will recall hemi dressing techniques with min VC OT Short Term Goal 2 (Week 1): Pt will visually attend to R arm during bathing tasks to demo  improve R inattention OT Short Term Goal 3 (Week 1): Pt will transfer to [facility] with MAX A of 1 caregiver to decrease BOC for toileting OT Short Term Goal 4 (Week 1): Pt will locate 2/2 grooming items on R of sink for improved R inattention  Skilled Therapeutic Interventions/Progress Updates:    1:1. Pt received in w/c agreeable to OT and wanting to wash up this afternoon. Pt able to use memory notebook with no VC for recalling the days activities. Pt completes seated and standing bathing with MOD A for power up into standing with R knee block and use of mirror/VC for midline orientation in stanidng while managing pants. Pt requires HOH A to incorporate RUE into bathing tasks as well as grooming with active finger flexion noted this session. Pt completes UB dressing with MIN A to thread RUE and max demo/VC for hemi dressing strategies. Pt completes MOD A closed chain squat pivot back to bed and OT provides PROM for RUE in all planes against flexor synergy, but below 90* flex/abduct d/t pain in higher ranges. Exited session with pt seated in bed, exit alarm on and call light in reach.  Therapy Documentation Precautions:  Precautions Precautions: Fall Precaution Comments: Increased tone RUE (flexion)/RLE(extension) Required Braces or Orthoses: Cervical Brace Cervical Brace: Soft collar, For comfort Restrictions Weight Bearing Restrictions: No General:   Vital Signs: Therapy Vitals Temp: 98 F (36.7 C) Pulse Rate: 80 Resp: 18 BP: 136/68 Patient Position (if appropriate):  Sitting Oxygen Therapy SpO2: 96 % O2 Device: Room Air Pain: Pain Assessment Faces Pain Scale: No hurt ADL: ADL Upper Body Bathing: Maximal cueing, Moderate assistance Where Assessed-Upper Body Bathing: Wheelchair, Sitting at sink Lower Body Bathing: Maximal assistance, Maximal cueing Where Assessed-Lower Body Bathing: Bed level Upper Body Dressing: Maximal assistance, Maximal cueing Where Assessed-Upper Body Dressing: Sitting at sink, Wheelchair Lower Body Dressing: Dependent Where Assessed-Lower Body Dressing: Bed level (able to bridge hips) Toilet Transfer: Dependent (stedy) Vision   Perception    Praxis   Exercises:   Other Treatments:     Therapy/Group: Individual Therapy  Tonny Branch 02/05/2020, 4:18 PM

## 2020-02-05 NOTE — Progress Notes (Signed)
Pt complaining of 9/10 spasms in rt leg. Tylenol adminisered but pt states pain still 7-8/10 after 1 hour. Heating pad applied and pt states relived some pain.

## 2020-02-05 NOTE — Progress Notes (Signed)
Speech Language Pathology Daily Session Note  Patient Details  Name: Alison Cuevas MRN: [id number] Date of Birth: 03-Aug-1945  Today's Date: 02/05/2020 SLP Individual Time: 2122-4825 SLP Individual Time Calculation (min): 45 min  Short Term Goals: Week 1: SLP Short Term Goal 1 (Week 1): Pt will consume trials of regular textures demonstrating efficient mastication and oral clearance across 2 sessions with no more than min A cues for oral clearance of residue. SLP Short Term Goal 2 (Week 1): Pt will sustain attention to functional tasks for 5 min with mod A verbal cues for redirection. SLP Short Term Goal 3 (Week 1): Pt will demonstrate recall of how to use call bell for assistance with Max A verbal cues. SLP Short Term Goal 4 (Week 1): Pt will demonstrate ability to problem solve basic familiar tasks with Max A verbal cues.  Skilled Therapeutic Interventions: Skilled ST services focused on swallow and cognitive skills. Pt utilized memory notebook to recall events from today with min a verbal cues. SLP facilitated sustained attention and basic problem solving skills in 4-6 step card sequencing tasks, pt required supervision A verbal cues to sequence 4 step cards and min A verbal cues to sequence 6 step cards. Pt demonstrated sustained attention in 30 minute intervals and easily redirected to task. Pt requested snack, dys 1 and dys 3 textures provided, pt demonstrated appropriate oral clearance and no overt s/s aspiration. SLP reduced supervision to intermittent supervision due to increase in mentation. Pt was left in room with call bell within reach and chair alarm set. ST recommends to continue skilled ST services.      Pain Pain Assessment Faces Pain Scale: No hurt  Therapy/Group: Individual Therapy  Abayomi Pattison  [facility] 02/05/2020, 3:06 PM

## 2020-02-06 ENCOUNTER — Inpatient Hospital Stay (HOSPITAL_COMMUNITY): Payer: Medicare Other | Admitting: Speech Pathology

## 2020-02-06 ENCOUNTER — Inpatient Hospital Stay (HOSPITAL_COMMUNITY): Payer: Medicare Other

## 2020-02-06 MED ORDER — HYDROCODONE-ACETAMINOPHEN 5-325 MG PO TABS
1.0000 | ORAL_TABLET | Freq: Every day | ORAL | Status: DC
  Administered 2020-02-06 – 2020-02-08 (×3): 1 via ORAL
  Filled 2020-02-06 (×3): qty 1

## 2020-02-06 NOTE — Progress Notes (Signed)
Occupational Therapy Weekly Progress Note  Patient Details  Name: Alison Cuevas MRN: [id number] Date of Birth: Jun 18, 1946  Beginning of progress report period: December 30, 2019 End of progress report period: January 06, 2020  Today's Date: 02/06/2020 OT Individual Time: 7741-4239 OT Individual Time Calculation (min): 60 min    Patient has met 3 of 4 short term goals.  Pt has made good progress this reporting period with improvement in confusion, orientation, mobility (mod-max A of 1 for transfers) and mod-max A for dressing tasks. Pt continues to demo increased flexor synergies in R extremities requiring use of posey extension air cast on RUE. Pt would benefit from skilled OT to continue to work on normalizing RUE tone in prep for funciton, R inattention, improving balance and cognition to improve performance of BADLs  Patient continues to demonstrate the following deficits: muscle weakness, decreased cardiorespiratoy endurance, impaired timing and sequencing, abnormal tone, unbalanced muscle activation, motor apraxia, decreased coordination and decreased motor planning, decreased midline orientation and right side neglect, decreased initiation, decreased attention, decreased awareness, decreased problem solving, decreased safety awareness, decreased memory and delayed processing and decreased sitting balance, decreased standing balance, decreased postural control, hemiplegia and decreased balance strategies and therefore will continue to benefit from skilled OT intervention to enhance overall performance with BADL and iADL.  Patient progressing toward long term goals..  Continue plan of care.  OT Short Term Goals Week 1:  OT Short Term Goal 1 (Week 1): Pt will recall hemi dressing techniques with min VC OT Short Term Goal 1 - Progress (Week 1): Progressing toward goal OT Short Term Goal 2 (Week 1): Pt will visually attend to R arm during bathing tasks to demo  improve R inattention OT Short Term Goal  2 - Progress (Week 1): Met OT Short Term Goal 3 (Week 1): Pt will transfer to [facility] with MAX A of 1 caregiver to decrease BOC for toileting OT Short Term Goal 3 - Progress (Week 1): Met OT Short Term Goal 4 (Week 1): Pt will locate 2/2 grooming items on R of sink for improved R inattention OT Short Term Goal 4 - Progress (Week 1): Met Week 2:  OT Short Term Goal 1 (Week 2): Pt will recall first step of UB hemi dressing with 1 VC OT Short Term Goal 2 (Week 2): Pt will thread BLE into pants with MIN A OT Short Term Goal 3 (Week 2): Pt will sit to stand with MOD A in prep for CM consistently OT Short Term Goal 4 (Week 2): Pt will maintain dynamic sitting balance with MIN A for 5 min during functional task  Skilled Therapeutic Interventions/Progress Updates:    1;1. Pt received in bed agreeable to OT wanting to get out of the chair. Total A transport to tx gym with pt working on standing/weight shifting L to reach for cards or horseshoes during standing balance tasks to break up pushing tendencies and retrain midline orientation. Ptrequires deep input to R knee/blocking of foot for placement onto ground as well as RUE WB into OT shoulder while OT sits on standard chair in front of patient. Mirror utilized for midline orientation and mod VC for attention to balance during matching and horse shoe task. Total A hair washing with hair tray, cotton ball in ear and R ear covered in tegaderm to keep water from entering d/t titanium place in ear. Exited session with pt seated in w/c, exit alarm on and call light tin reach  Therapy Documentation Precautions:  Precautions Precautions: Fall Precaution Comments: Increased tone RUE (flexion)/RLE(extension) Required Braces or Orthoses: Cervical Brace Cervical Brace: Soft collar, For comfort Restrictions Weight Bearing Restrictions: No General:   Vital Signs: Therapy Vitals Temp: 98.5 F (36.9 C) Pulse Rate: 84 Resp: 16 BP: (!) 144/67 Patient Position (if  appropriate): Lying Oxygen Therapy SpO2: 96 % Pain:   ADL: ADL Upper Body Bathing: Maximal cueing, Moderate assistance Where Assessed-Upper Body Bathing: Wheelchair, Sitting at sink Lower Body Bathing: Maximal assistance, Maximal cueing Where Assessed-Lower Body Bathing: Bed level Upper Body Dressing: Maximal assistance, Maximal cueing Where Assessed-Upper Body Dressing: Sitting at sink, Wheelchair Lower Body Dressing: Dependent Where Assessed-Lower Body Dressing: Bed level (able to bridge hips) Toilet Transfer: Dependent (stedy) Vision   Perception    Praxis   Exercises:   Other Treatments:     Therapy/Group: Individual Therapy  Tonny Branch 02/06/2020, 6:52 AM

## 2020-02-06 NOTE — Progress Notes (Signed)
Speech Language Pathology Weekly Progress and Session Note  Patient Details  Name: Alison Cuevas MRN: [id number] Date of Birth: 10/07/1945  Beginning of progress report period: January 30, 2020 End of progress report period: February 06, 2020  Today's Date: 02/06/2020 SLP Individual Time: 0725-0820 SLP Individual Time Calculation (min): 55 min  Short Term Goals: Week 1: SLP Short Term Goal 1 (Week 1): Pt will consume trials of regular textures demonstrating efficient mastication and oral clearance across 2 sessions with no more than min A cues for oral clearance of residue. SLP Short Term Goal 1 - Progress (Week 1): Not met SLP Short Term Goal 2 (Week 1): Pt will sustain attention to functional tasks for 5 min with mod A verbal cues for redirection. SLP Short Term Goal 2 - Progress (Week 1): Met SLP Short Term Goal 3 (Week 1): Pt will demonstrate recall of how to use call bell for assistance with Max A verbal cues. SLP Short Term Goal 3 - Progress (Week 1): Met SLP Short Term Goal 4 (Week 1): Pt will demonstrate ability to problem solve basic familiar tasks with Max A verbal cues. SLP Short Term Goal 4 - Progress (Week 1): Met    New Short Term Goals: Week 2: SLP Short Term Goal 1 (Week 2): Pt will consume trials of regular textures demonstrating efficient mastication and oral clearance across 2 sessions with no more than min A cues for oral clearance of residue. SLP Short Term Goal 2 (Week 2): Pt will sustain attention to functional tasks for 15 mins with mod A verbal cues for redirection. SLP Short Term Goal 3 (Week 2): Patient will utilize external aids to recall daily information with Mod A verbal and visual cues. SLP Short Term Goal 4 (Week 2): Patient will demonstrate functional problem solving for basic and familiar tasks with Mod A verbal cues. SLP Short Term Goal 5 (Week 2): Patient will self-monitor and correct errors during functional tasks with Mod A verbal and visual  cues.  Weekly Progress Updates: Patient has made functional gains and has met 3 of 4 STGs this reporting period. Currently, patient is consuming Dys. 3 textures with thin liquids with minimal overt s/s of aspiration and overall intermittent supervision for use of swallowing compensatory strategies. Patient also requires overall Max A multimodal cues to complete functional and familiar tasks safely in regards to problem solving, recall, attention and awareness. Patient and family education ongoing. Patient would benefit from continued skilled SLP intervention to maximize her swallowing and cognitive functioning prior to discharge.      Intensity: Minumum of 1-2 x/day, 30 to 90 minutes Frequency: 3 to 5 out of 7 days Duration/Length of Stay: 02/27/20 Treatment/Interventions: Cognitive remediation/compensation;Dysphagia/aspiration precaution training;Functional tasks;Patient/family education;Internal/external aids;Environmental Environmental consultant;Therapeutic Activities   Daily Session  Skilled Therapeutic Interventions: Skilled treatment session focused on cognitive and dysphagia goals. Upon arrival, patient requested to use the bathroom. SLP facilitated session by providing overall Mod A verbal cues for sequencing and safety with transfer to the commode via the [facility]. Patient unable to have a bowel movement, RN aware. SLP also facilitated session by providing skilled observation with breakfast meal of Dys. 3 textures with thin liquids. Patient consumed meal with intermittent overt signs of pharyngeal residue that seemed to clear with improved positioning and increased moisture to the food (syrup on pancakes). Recommend patient continue current diet. Patient was unable to locate her schedule, therefore, the SLP provided one for her to utilize to anticipate upcoming therapy appointments. Patient able  ot utilize her schedule and memory book for recall with overall Max A multimodal cues. Patient left  upright in bed with alarm on and all needs within reach. Continue with current plan of care.   Pain No/Denies Pain   Therapy/Group: Individual Therapy  Alison Cuevas 02/06/2020, 2:47 PM

## 2020-02-06 NOTE — Plan of Care (Signed)
  Problem: RH BLADDER ELIMINATION Goal: RH STG MANAGE BLADDER WITH ASSISTANCE Description: STG Manage Bladder With min Assistance Outcome: Not Progressing; I and o cath

## 2020-02-06 NOTE — Progress Notes (Signed)
Physical Therapy Weekly Progress Note  Patient Details  Name: Alison Cuevas MRN: [id number] Date of Birth: January 18, 1946  Beginning of progress report period: January 30, 2020 End of progress report period: February 06, 2020  Today's Date: 02/06/2020 PT Individual Time: 1315-1430 and 1535-1545 PT Individual Time Calculation (min): 75 min and 10 min   Patient has met 4 of 4 short term goals.  Patient has made steady progress towards her goals this week. She currently requires mod-min A for bed mobility and transfers, mod A for gait 30 feet using L rail, and tolerates standing balance >5 min with min A for balance and cues for midline orientation. Patient with improved PF extensor tone in standing positions, continuing to trial interventions for reduced tone in lying.   Patient continues to demonstrate the following deficits muscle weakness and muscle joint tightness, decreased cardiorespiratoy endurance, impaired timing and sequencing, abnormal tone, decreased coordination and decreased motor planning, decreased midline orientation, decreased attention to right and decreased motor planning, decreased attention, decreased problem solving and decreased memory and decreased sitting balance, decreased standing balance, decreased postural control, hemiplegia and decreased balance strategies and therefore will continue to benefit from skilled PT intervention to increase functional independence with mobility.  Patient progressing toward long term goals..  Continue plan of care.  PT Short Term Goals Week 1:  PT Short Term Goal 1 (Week 1): Patient will perform bed mobility with mod A. PT Short Term Goal 1 - Progress (Week 1): Met PT Short Term Goal 2 (Week 1): Patient will perform basic transfers with mod A using LRAD. PT Short Term Goal 2 - Progress (Week 1): Met PT Short Term Goal 3 (Week 1): Patient will initate gait training. PT Short Term Goal 3 - Progress (Week 1): Met PT Short Term Goal 4 (Week 1):  Patient will perform standing activities >5 min focused on weight bearing for R LE tone management. PT Short Term Goal 4 - Progress (Week 1): Met Week 2:  PT Short Term Goal 1 (Week 2): Patient will perform bed mobility with CGA. PT Short Term Goal 2 (Week 2): Patient will perform basic transfers with min A consitently. PT Short Term Goal 3 (Week 2): Patient will ambulate 50 feet using LRAD with mod A. PT Short Term Goal 4 (Week 2): Patient will perform standing balance >5 min with CGA.  Skilled Therapeutic Interventions/Progress Updates:     Session 1: Patient in w/c upon PT arrival. Patient alert and agreeable to PT session. Patient denied pain during session.  Therapeutic Activity: Bed Mobility: Patient performed sit to supine and rolling to/from prone with mod A with use of bed rails. Provided verbal cues for bringing knees to chest when lifting LEs onto the bed and use of L LE and arm to assist with rolling. Transfers: Patient performed sit to/from stand x1 without AD and x2 using a L rail with min A with PT facilitation and her knee for R weight bearing. Provided verbal cues for scooting forward, forward weight shift, and keeping her R heel on the floor. She performed stand pivot from w/c>bed with min-mod A due to poor motor planning during pivot. Provided heavy cues for sequencing, weight shift, and stepping with L LE to turn.   Gait Training:  Patient ambulated 30 feet x2 using a L rail with mod A and mod-max A for R foot placement, with patient initiating limb advancement. Required facilitation for placing her R heel on the ground and facilitate R knee extension before  weight shift on to R LE to protect her knee from hyperextension and ankle from inversion. Provided verbal cues for sequencing, timing, initiation of R limb swing, and weight shifts.  Wheelchair Mobility:  Patient was transported in the w/c with total A throughout session for energy conservation and time  management.  Following gait training, placed patient in prone position for DF stretch and placement of kinesiotape for inhibition of soleus and gastroc (no stretch, applied distal to proximal) and activation of anterior tibialis (25-50% stretch, applied proximal to distal). Immediately following, patient demonstrated spontaneous DF activation several times. Patient denied any previous reaction to tape or adhesive prior to placement. Educated on signs and symptoms of reaction and instructed patient call to have it removed immediately if symptoms occurred. Educated on wearing tape for 3 days and that it is safe to shower with the tape on. Patient stated understanding with no signs of reaction to the skin after 5 min.   Noted increased flexor tone of R UE following prone positioning. Will return at end of day to apply arm splint after tone calms down.   Patient in bed at end of session with breaks locked, bed alarm set, and all needs within reach.   Session 2: Patient in bed with her son at bedside upon PT arrival. Patient denied pain or irritation from kinesiotape, no change in skin and patient tolerating well. Continues to have spontaneous R DF activation. Provided gentle pressure and stretch to R UE for hand, finger, and elbow extension, shoulder ER and flexion with abduction. Utilized vibration and massage to assist relaxation and break up tone for improved ROM x8 min. Discussed d/c planning, process for family education, patient's progress and goals with patient and her son during stretch. Applied UE soft splint at patient's elbow to reduce tone in lying and repositioned heels to float at end of session. Patient in bed with her son at bedside with breaks locked, bed alarm set, and all needs in reach.    Therapy Documentation Precautions:  Precautions Precautions: Fall Precaution Comments: Increased tone RUE (flexion)/RLE(extension) Required Braces or Orthoses: Cervical Brace Cervical Brace: Soft  collar, For comfort Restrictions Weight Bearing Restrictions: No   Therapy/Group: Individual Therapy  Marveline Profeta L Samvel Zinn PT, DPT  02/06/2020, 4:19 PM

## 2020-02-06 NOTE — Progress Notes (Signed)
Patient ID: Alison Cuevas, female   DOB: [birth year], 74 y.o.   MRN: [id number]  SW spoke with [name] [phone]) to confirm she will be available to meet with pt with neuropsych on Friday 8/20 at 9am. Reports other siblings will call in. SW informed will confirm the day before for meeting.   Loralee Pacas, MSW, [place] Office: [phone] Cell: [phone] Fax: [phone]

## 2020-02-06 NOTE — Progress Notes (Signed)
[facility] PHYSICAL MEDICINE & REHABILITATION PROGRESS NOTE   Subjective/Complaints: Still struggled to sleep somewhat last night d/t back pain, spasms. No issues with hydrocodone  ROS: Patient denies fever, rash, sore throat, blurred vision, nausea, vomiting, diarrhea, cough, shortness of breath or chest pain, headache, or mood change.      Objective:   No results found. No results for input(s): WBC, HGB, HCT, PLT in the last 72 hours. Recent Labs    02/04/20 0652  NA 134*  K 3.8  CL 102  CO2 23  GLUCOSE 100*  BUN 9  CREATININE 0.69  CALCIUM 8.7*    Intake/Output Summary (Last 24 hours) at 02/06/2020 1003 Last data filed at 02/06/2020 0830 Gross per 24 hour  Intake 438 ml  Output 1436 ml  Net -998 ml     Physical Exam: Vital Signs Blood pressure (!) 144/67, pulse 84, temperature 98.5 F (36.9 C), resp. rate 16, height 5\' 2"  (1.575 m), weight 62.8 kg, last menstrual period 06/27/1972, SpO2 96 %. Constitutional: No distress . Vital signs reviewed. HEENT: EOMI, oral membranes moist Neck: supple Cardiovascular: RRR without murmur. No JVD    Respiratory/Chest: CTA Bilaterally without wheezes or rales. Normal effort    GI/Abdomen: BS +, non-tender, non-distended Ext: no clubbing, cyanosis, or edema Psych: pleasant and cooperative Skin: Clean and intact without signs of breakdown Neuro: very alert. Oriented to person, place, reason she's here. Language norma, speech intact.. right central 7. Right inattention. RUE and RLE 0/5. LUE/LLE 4-4+/5 prox to distal. Senses pain in right arm and leg. Flexor tone RUE 1+/4. Extensor RLE with heel cord contracture MusculoskeletalRight heel cord contracture still present-20->30 today, mild low back pain ongoing.       Assessment/Plan: 1. 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  Physiatrist is providing close team  supervision and 24 hour management of active medical problems listed below.  Physiatrist and rehab team continue to assess barriers to discharge/monitor patient progress toward functional and medical goals  Care Tool:  Bathing    Body parts bathed by patient: Chest, Abdomen, Front perineal area, Face, Right arm, Right upper leg, Left upper leg   Body parts bathed by helper: Left arm, Buttocks, Right lower leg, Left lower leg Body parts n/a: Right upper leg, Left upper leg, Right lower leg, Left lower leg   Bathing assist Assist Level: Moderate Assistance - Patient 50 - 74%     Upper Body Dressing/Undressing Upper body dressing   What is the patient wearing?: Hospital gown only    Upper body assist Assist Level: Maximal Assistance - Patient 25 - 49%    Lower Body Dressing/Undressing Lower body dressing      What is the patient wearing?: Pants     Lower body assist Assist for lower body dressing: Maximal Assistance - Patient 25 - 49%     Toileting Toileting    Toileting assist Assist for toileting: Maximal Assistance - Patient 25 - 49%     Transfers Chair/bed transfer  Transfers assist     Chair/bed transfer assist level: Moderate Assistance - Patient 50 - 74%     Locomotion Ambulation   Ambulation assist   Ambulation activity did not occur: Safety/medical concerns (decreased motor control, R hemi)  Assist level: Moderate Assistance - Patient 50 - 74% Assistive device: Other (comment) (L rail)     Walk 10 feet activity   Assist  Walk  10 feet activity did not occur: Safety/medical concerns  Assist level: Moderate Assistance - Patient - 50 - 74% Assistive device: Other (comment) (L rail)   Walk 50 feet activity   Assist Walk 50 feet with 2 turns activity did not occur: Safety/medical concerns         Walk 150 feet activity   Assist Walk 150 feet activity did not occur: Safety/medical concerns         Walk 10 feet on uneven surface   activity   Assist Walk 10 feet on uneven surfaces activity did not occur: Safety/medical concerns         Wheelchair     Assist Will patient use wheelchair at discharge?: Yes (Per PT long-term goals) Type of Wheelchair: Manual Wheelchair activity did not occur: Safety/medical concerns (unable with and without skilled intervention due to poor motor planning and attention)         Wheelchair 50 feet with 2 turns activity    Assist    Wheelchair 50 feet with 2 turns activity did not occur: Safety/medical concerns       Wheelchair 150 feet activity     Assist  Wheelchair 150 feet activity did not occur: Safety/medical concerns       Blood pressure (!) 144/67, pulse 84, temperature 98.5 F (36.9 C), resp. rate 16, height 5\' 2"  (1.575 m), weight 62.8 kg, last menstrual period 06/27/1972, SpO2 96 %.  Medical Problem List and Plan: 1. 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             -patient may shower             -ELOS/Goals: 28-32 days/Min A             -continue CIR therapies with PT, OT, SLP  - adjustable tension night-splint/dorsi-flexion splint to address right heel cord contracture  2.  Antithrombotics: -DVT/anticoagulation:  Mechanical: Sequential compression devices, below knee Bilateral lower extremities             -antiplatelet therapy: None d/t hemorrhage 3. Chronic neck pain/Pain Management: Uses  Hydrocodone prn at home-->Continue to hold given AMS  -schedule tylenol 500mg  tid  -kpad  -8/12will increase hydrocodone to full 5/325mg  tablet at bedtime  -continue ROM RUE/RLE/splinting 4. Mood: LCSW to follow for evaluation and support.              -antipsychotic agents: N/A 5. Neuropsych: This patient  is not capable of making decisions on her own behalf. 6. Skin/Wound Care: Routine pressure relief measures.  7. Fluids/Electrolytes/Nutrition:   -labs improved today, K+ 3.8, BUN down  -dc'ed IVF  -po intake better but still negative -990cc yesterday per documentation  -encourage PO, check labs tomorrow  8. HTN: Monitor BP tid             Verapamil and lisinopril.              reasonable control until this morning's reading. Continue to monitor for pattern Vitals:   02/05/20 2112 02/06/20 0449  BP: (!) 155/72 (!) 144/67  Pulse: 90 84  Resp: 17 16  Temp: 98.3 F (36.8 C) 98.5 F (36.9 C)  SpO2: 93% 96%  Hypertension controlled 8/11 9. Kleb pneumonia UTI: ceftriaxone x3 days and now on keflex to  complete 7 day course. abx completed         -low grade temp--now afebrile  - re-check ucx negative 10. Delirum/AMS:  .   - resolving  -continue 12mg  seroquel at HS    11. Dysphagia:   -D3/thins, advance per SLP   13.  Small volume rectal bleeding per nursing after disimpaction, H&H stable.   Likely traumatic due to hard stool  -increased stool softeners 14.  Otitis externa with wax build up on right  -hold Corticosporin drops  -debrox drops for 3 days   LOS: 8 days A FACE TO FACE EVALUATION WAS PERFORMED  Meredith Staggers 02/06/2020, 10:03 AM

## 2020-02-07 ENCOUNTER — Inpatient Hospital Stay (HOSPITAL_COMMUNITY): Payer: Medicare Other | Admitting: Speech Pathology

## 2020-02-07 ENCOUNTER — Inpatient Hospital Stay (HOSPITAL_COMMUNITY): Payer: Medicare Other | Admitting: Physical Therapy

## 2020-02-07 ENCOUNTER — Inpatient Hospital Stay (HOSPITAL_COMMUNITY): Payer: Medicare Other

## 2020-02-07 LAB — BASIC METABOLIC PANEL
Anion gap: 11 (ref 5–15)
BUN: 14 mg/dL (ref 8–23)
CO2: 21 mmol/L — ABNORMAL LOW (ref 22–32)
Calcium: 9.2 mg/dL (ref 8.9–10.3)
Chloride: 102 mmol/L (ref 98–111)
Creatinine, Ser: 0.83 mg/dL (ref 0.44–1.00)
GFR calc Af Amer: >60 mL/min (ref >60)
GFR calc non Af Amer: >60 mL/min (ref >60)
Glucose, Bld: 106 mg/dL — ABNORMAL HIGH (ref 70–99)
Potassium: 4.1 mmol/L (ref 3.5–5.1)
Sodium: 134 mmol/L — ABNORMAL LOW (ref 135–145)

## 2020-02-07 MED ORDER — CARBAMIDE PEROXIDE 6.5 % OT SOLN
5.0000 [drp] | Freq: Two times a day (BID) | OTIC | Status: AC
  Administered 2020-02-07 – 2020-02-08 (×2): 5 [drp] via OTIC
  Filled 2020-02-07: qty 15

## 2020-02-07 NOTE — Progress Notes (Signed)
Occupational Therapy Session Note  Patient Details  Name: Alison Cuevas MRN: [id number] Date of Birth: 1946/02/28  Today's Date: 02/07/2020 OT Individual Time: 0900-1000 OT Individual Time Calculation (min): 60 min    Short Term Goals: Week 1:  OT Short Term Goal 1 (Week 1): Pt will recall hemi dressing techniques with min VC OT Short Term Goal 1 - Progress (Week 1): Progressing toward goal OT Short Term Goal 2 (Week 1): Pt will visually attend to R arm during bathing tasks to demo  improve R inattention OT Short Term Goal 2 - Progress (Week 1): Met OT Short Term Goal 3 (Week 1): Pt will transfer to [facility] with MAX A of 1 caregiver to decrease BOC for toileting OT Short Term Goal 3 - Progress (Week 1): Met OT Short Term Goal 4 (Week 1): Pt will locate 2/2 grooming items on R of sink for improved R inattention OT Short Term Goal 4 - Progress (Week 1): Met  Skilled Therapeutic Interventions/Progress Updates:    1:1. Pt received in w/c agreeable to change clothing with no pain reported. Pt completes doffing gown with min A and washes armpits with HOH A for RUE use. OT applies K tape to facilitate external rotation, retraction and supination/elbow flexion. Pt dons gown with MOD A overall and max VC for hemi dressing. Pt grooms with HOH A for RUE incorporated into bimmanual task. MOD A squat pivot transfer with VC for body mechanics and foot placement. Pt completes standing weight shifting rolling ball to far L across bench to decrease pushing. Pt standing with MOD-max A attending to RUE  Bending and straightening elbow in stance with trace elbow activation with hand flat on table for NMR/deep EB input into joints. Pt able ot straighten elbow in seated with gravity assistance. Exited session with pt seated in w/c, belt alarm on and R flexion boot in place.   Therapy Documentation Precautions:  Precautions Precautions: Fall Precaution Comments: Increased tone RUE (flexion)/RLE(extension) Required  Braces or Orthoses: Cervical Brace Cervical Brace: Soft collar, For comfort Restrictions Weight Bearing Restrictions: No General:   Vital Signs:  Pain: Pain Assessment Pain Scale: 0-10 Pain Score: 6  Pain Type: Acute pain Pain Location: Neck Pain Orientation: Posterior Pain Descriptors / Indicators: Aching Pain Frequency: Constant Pain Onset: On-going Patients Stated Pain Goal: 4 Pain Intervention(s): Medication (See eMAR) ADL: ADL Upper Body Bathing: Maximal cueing, Moderate assistance Where Assessed-Upper Body Bathing: Wheelchair, Sitting at sink Lower Body Bathing: Maximal assistance, Maximal cueing Where Assessed-Lower Body Bathing: Bed level Upper Body Dressing: Maximal assistance, Maximal cueing Where Assessed-Upper Body Dressing: Sitting at sink, Wheelchair Lower Body Dressing: Dependent Where Assessed-Lower Body Dressing: Bed level (able to bridge hips) Toilet Transfer: Dependent (stedy) Vision   Perception    Praxis   Exercises:   Other Treatments:     Therapy/Group: Individual Therapy  Tonny Branch 02/07/2020, 10:01 AM

## 2020-02-07 NOTE — Progress Notes (Signed)
Speech Language Pathology Daily Session Note  Patient Details  Name: Alison Cuevas MRN: [id number] Date of Birth: [birth year]  Today's Date: 02/07/2020 SLP Individual Time: 0725-0825 SLP Individual Time Calculation (min): 60 min  Short Term Goals: Week 2: SLP Short Term Goal 1 (Week 2): Pt will consume trials of regular textures demonstrating efficient mastication and oral clearance across 2 sessions with no more than min A cues for oral clearance of residue. SLP Short Term Goal 2 (Week 2): Pt will sustain attention to functional tasks for 15 mins with mod A verbal cues for redirection. SLP Short Term Goal 3 (Week 2): Patient will utilize external aids to recall daily information with Mod A verbal and visual cues. SLP Short Term Goal 4 (Week 2): Patient will demonstrate functional problem solving for basic and familiar tasks with Mod A verbal cues. SLP Short Term Goal 5 (Week 2): Patient will self-monitor and correct errors during functional tasks with Mod A verbal and visual cues.  Skilled Therapeutic Interventions: Skilled treatment session focused on cognitive and dysphagia goals. Upon arrival, patient requested to use the bathroom. SLP facilitated session by providing overall Mod A verbal cues for sequencing and safety with transfer to the commode via the [facility]. Patient was continent of a small bowel movement. SLP also facilitated session by providing skilled observation with breakfast meal of Dys. 3 textures with thin liquids. Patient consumed meal without  overt s/s of aspiration, suspect due to maximizing safety with postioning in the wheelchair.  Recommend patient continue current diet. Patient independently utilized her schedule to anticipate upcoming appointments and wrote a series of questions down for the physician in order to maximize recall. Patient left upright in bed with alarm on and all needs within reach. Continue with current plan of care.       Pain No/Denies Pain    Therapy/Group: Individual Therapy  Siya Flurry 02/07/2020, 12:15 PM

## 2020-02-07 NOTE — Progress Notes (Signed)
[place] PHYSICAL MEDICINE & REHABILITATION PROGRESS NOTE   Subjective/Complaints: Had a better night until she was woken up for VS this morning. Couldn't go back to sleep after that. PT tried K-tape yesterday on right foot which seemed to work well to loosen leg and help with TA activation. Didn't seem to work as well today.   ROS: Patient denies fever, rash, sore throat, blurred vision, nausea, vomiting, diarrhea, cough, shortness of breath or chest pain,   headache, or mood change.      Objective:   No results found. No results for input(s): WBC, HGB, HCT, PLT in the last 72 hours. Recent Labs    02/07/20 0706  NA 134*  K 4.1  CL 102  CO2 21*  GLUCOSE 106*  BUN 14  CREATININE 0.83  CALCIUM 9.2    Intake/Output Summary (Last 24 hours) at 02/07/2020 1054 Last data filed at 02/07/2020 0830 Gross per 24 hour  Intake 480 ml  Output 300 ml  Net 180 ml     Physical Exam: Vital Signs Blood pressure (!) 144/67, pulse 97, temperature 98.3 F (36.8 C), temperature source Oral, resp. rate 12, height 5\' 2"  (1.575 m), weight 62.8 kg, last menstrual period 06/27/1972, SpO2 97 %. Constitutional: No distress . Vital signs reviewed. HEENT: EOMI, oral membranes moist Neck: supple Cardiovascular: RRR without murmur. No JVD    Respiratory/Chest: CTA Bilaterally without wheezes or rales. Normal effort    GI/Abdomen: BS +, non-tender, non-distended Ext: no clubbing, cyanosis, or edema Psych: pleasant and cooperative Skin: Clean and intact without signs of breakdown Neuro: very alert. Oriented to person, place, reason. STM deficits. Language normal, speech intact.. right central 7. Right inattention. RUE and RLE 0/5. LUE/LLE 4-4+/5 prox to distal. Senses pain in right arm and leg. Flexor tone RUE 1+/4. Extensor RLE with heel cord contracture ongoing MusculoskeletalRight heel cord contracture still present-20 degrees today, mild low back tenderness       Assessment/Plan: 1. 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  Physiatrist is providing close team supervision and 24 hour management of active medical problems listed below.  Physiatrist and rehab team continue to assess barriers to discharge/monitor patient progress toward functional and medical goals  Care Tool:  Bathing    Body parts bathed by patient: Chest, Abdomen, Front perineal area, Face, Right arm, Right upper leg, Left upper leg   Body parts bathed by helper: Left arm, Buttocks, Right lower leg, Left lower leg Body parts n/a: Right upper leg, Left upper leg, Right lower leg, Left lower leg   Bathing assist Assist Level: Moderate Assistance - Patient 50 - 74%     Upper Body Dressing/Undressing Upper body dressing   What is the patient wearing?: Hospital gown only    Upper body assist Assist Level: Maximal Assistance - Patient 25 - 49%    Lower Body Dressing/Undressing Lower body dressing      What is the patient wearing?: Pants     Lower body assist Assist for lower body dressing: Maximal Assistance - Patient 25 - 49%     Toileting Toileting    Toileting assist Assist for toileting: Maximal Assistance - Patient 25 - 49%     Transfers Chair/bed transfer  Transfers assist     Chair/bed transfer assist level: Moderate Assistance - Patient 50 - 74%     Locomotion Ambulation   Ambulation assist   Ambulation activity  did not occur: Safety/medical concerns (decreased motor control, R hemi)  Assist level: Moderate Assistance - Patient 50 - 74% Assistive device: Other (comment) (L rail) Max distance: 30 ft   Walk 10 feet activity   Assist  Walk 10 feet activity did not occur: Safety/medical concerns  Assist level: Moderate Assistance - Patient - 50 - 74% Assistive device: Other (comment) (L rail)   Walk 50 feet activity   Assist Walk 50 feet with 2 turns  activity did not occur: Safety/medical concerns         Walk 150 feet activity   Assist Walk 150 feet activity did not occur: Safety/medical concerns         Walk 10 feet on uneven surface  activity   Assist Walk 10 feet on uneven surfaces activity did not occur: Safety/medical concerns         Wheelchair     Assist Will patient use wheelchair at discharge?: Yes (Per PT long-term goals) Type of Wheelchair: Manual Wheelchair activity did not occur: Safety/medical concerns (unable with and without skilled intervention due to poor motor planning and attention)         Wheelchair 50 feet with 2 turns activity    Assist    Wheelchair 50 feet with 2 turns activity did not occur: Safety/medical concerns       Wheelchair 150 feet activity     Assist  Wheelchair 150 feet activity did not occur: Safety/medical concerns       Blood pressure (!) 144/67, pulse 97, temperature 98.3 F (36.8 C), temperature source Oral, resp. rate 12, height 5\' 2"  (1.575 m), weight 62.8 kg, last menstrual period 06/27/1972, SpO2 97 %.  Medical Problem List and Plan: 1. 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             -patient may shower             -ELOS/Goals: 28-32 days/Min A             -continue CIR therapies with PT, OT, SLP  -adjustable tension night-splint/dorsi-flexion splint to address right heel cord contracture  2.  Antithrombotics: -DVT/anticoagulation:  Mechanical: Sequential compression devices, below knee Bilateral lower extremities             -antiplatelet therapy: None d/t hemorrhage 3. Chronic neck pain/Pain Management: Uses  Hydrocodone prn at home-->Continue to hold given AMS  -schedule tylenol 500mg   tid  -kpad  -8/13 seemed to do better with HS scheduled hydrocodone (full tab)--continue 4. Mood: LCSW to follow for evaluation and support.              -antipsychotic agents: N/A 5. Neuropsych: This patient is not capable of making decisions on her own behalf. 6. Skin/Wound Care: Routine pressure relief measures.  7. Fluids/Electrolytes/Nutrition:   -labs seem to be holding steady today 8/13.    -K+ 4.1, BUN sl elevated   -recheck BMET monday  8. HTN: Monitor BP tid             Verapamil and lisinopril.                Vitals:   02/06/20 2042 02/07/20 0331  BP: 135/67 (!) 144/67  Pulse: 89 97  Resp: 18 12  Temp: 98.2 F (36.8  C) 98.3 F (36.8 C)  SpO2: 96% 97%  Hypertension controlled 8/13 9. Kleb pneumonia UTI: ceftriaxone x3 days and now on keflex to complete 7 day course. abx completed         -low grade temp--now afebrile  - f/u ucx negative 10. Delirum/AMS:  .   - resolving  -continue 12mg  seroquel at HS    11. Dysphagia:   -D3/thins, advance per SLP   13.  Small volume rectal bleeding likely traumatic due to hard stool  -increased stool softeners  -resolved 14.  Otitis externa with wax build up on right  -hold Corticosporin drops  -debrox drops--continue thru weekend   LOS: 9 days A FACE TO [place] 02/07/2020, 10:54 AM

## 2020-02-07 NOTE — Progress Notes (Signed)
Orthopedic Tech Progress Note Patient Details:  Alison Cuevas 02-28-1946 [id number] Brace left with patient for use during therapy. Ortho Devices Type of Ortho Device: Ankle Air splint Ortho Device/Splint Location: Patient Right Ankle Ortho Device/Splint Interventions: Ordered   Post Interventions Patient Tolerated: Other (comment) Instructions Provided: Adjustment of device, Care of device  Patient ID: Alison Cuevas, female   DOB: 03-04-1946, 74 y.o.   MRN: [id number]   Rosana Hoes 02/07/2020, 5:12 PM

## 2020-02-07 NOTE — Progress Notes (Signed)
Physical Therapy Session Note  Patient Details  Name: Alison Cuevas MRN: [id number] Date of Birth: 1946/02/02  Today's Date: 02/07/2020 PT Individual Time: 4825-0037 PT Individual Time Calculation (min): 70 min   Short Term Goals: Week 2:  PT Short Term Goal 1 (Week 2): Patient will perform bed mobility with CGA. PT Short Term Goal 2 (Week 2): Patient will perform basic transfers with min A consitently. PT Short Term Goal 3 (Week 2): Patient will ambulate 50 feet using LRAD with mod A. PT Short Term Goal 4 (Week 2): Patient will perform standing balance >5 min with CGA.  Skilled Therapeutic Interventions/Progress Updates:  Pt received in w/c & agreeable to tx. PT donned B socks & shoes total assist; pt requires MAX overpressure on RLE to achieve neutral dorsiflexion in RLE. Pt transfers sit<>stand at sink with mod assist to allow PT to don brief total assist. PT blocks RLE and provides mod assist for balance. At rail in hallway pt transfers sit<>stand with mod assist with max cuing to push up from w/c armrest. Pt's R ankle demonstrates inverted plantarflexion but PT able to correct this by providing approximation at knee. Pt also demonstrates significant R knee hyperextension in stance phase with PT blocking to prevent A/P instability. Standing at rail pt engages in forward/backward stepping with LLE with PT blocking/supporting RLE and task focusing on RLE strengthening & weight bearing for NMR. Pt requires cuing for upright posture & forward vs downward gaze. PT ace wrapped R ankle to correct inversion & plantarflexion with slight improvement noted. Progressed to gait at rail x 12 ft with mod/max assist + w/c follow for safety. Pt is able to participate in advancing RLE but PT maintains R foot contact on ground due to pt demonstrating R ankle inversion/plantarflexion & knee instability worsens in open chain. In sitting, PT has to break extensor tone & once R knee is flexed pt with improved ability to  achieve neutral ankle. At dynavision pt stands without UE support while engaging in light board with focus on scanning to R & standing balance with pt requiring cuing to correct R lateral & posterior lean but pt with fair ability to do so even though she's unable to recognize when it needs to be corrected. Pt propels w/c ortho gym>room with L hemi technique & mod assist for steering 2/2 R inattention & decreased coordination with LUE/LLE. At end of session PT dons RLE resting splint & RUE resting hand splint. Pt left in w/c with chair alarm donned & call bell in reach. Pt able to independently recall need to make memory notebook entry.  Pt with impaired memory as she reports "I passed out this morning. I thought I did." but this not noted in chart.   Therapy Documentation Precautions:  Precautions Precautions: Fall Precaution Comments: Increased tone RUE (flexion)/RLE(extension) Required Braces or Orthoses: Cervical Brace Cervical Brace: Soft collar, For comfort Restrictions Weight Bearing Restrictions: No   Pain: 6-7/10 in R hip - pt reports she's premedicated & rest breaks provided PRN   Therapy/Group: Individual Therapy  Waunita Schooner 02/07/2020, 4:10 PM

## 2020-02-08 MED ORDER — MECLIZINE HCL 25 MG PO TABS
12.5000 mg | ORAL_TABLET | Freq: Three times a day (TID) | ORAL | Status: DC | PRN
  Administered 2020-02-08 (×2): 12.5 mg via ORAL
  Filled 2020-02-08 (×2): qty 1

## 2020-02-08 NOTE — Progress Notes (Signed)
[place] PHYSICAL MEDICINE & REHABILITATION PROGRESS NOTE   Subjective/Complaints: Complaining of right ear pain- felt ear drops were making it worse Eating lunch Throbbing in RLE Denies constipation.   ROS: Patient denies fever, rash, sore throat, blurred vision, nausea, vomiting, diarrhea, cough, shortness of breath or chest pain,   headache, or mood change.      Objective:   No results found. No results for input(s): WBC, HGB, HCT, PLT in the last 72 hours. Recent Labs    02/07/20 0706  NA 134*  K 4.1  CL 102  CO2 21*  GLUCOSE 106*  BUN 14  CREATININE 0.83  CALCIUM 9.2    Intake/Output Summary (Last 24 hours) at 02/08/2020 1250 Last data filed at 02/08/2020 0729 Gross per 24 hour  Intake 265 ml  Output 2 ml  Net 263 ml     Physical Exam: Vital Signs Blood pressure (!) 146/66, pulse 95, temperature 98.5 F (36.9 C), temperature source Oral, resp. rate 18, height 5\' 2"  (1.575 m), weight 62.6 kg, last menstrual period 06/27/1972, SpO2 96 %. General: Alert and oriented x 3, No apparent distress HEENT: Head is normocephalic, atraumatic, PERRLA, EOMI, sclera anicteric, oral mucosa pink and moist, dentition intact, ext ear canals clear,  Neck: Supple without JVD or lymphadenopathy Heart: Reg rate and rhythm. No murmurs rubs or gallops Chest: CTA bilaterally without wheezes, rales, or rhonchi; no distress Abdomen: Soft, non-tender, non-distended, bowel sounds positive. Extremities: No clubbing, cyanosis, or edema. Pulses are 2+ Skin: Clean and intact without signs of breakdown Neuro: very alert. Oriented to person, place, reason. STM deficits. Language normal, speech intact.. right central 7. Right inattention. RUE and RLE 0/5. LUE/LLE 4-4+/5 prox to distal. Senses pain in right arm and leg. Flexor tone RUE 1+/4. Extensor RLE with heel cord contracture ongoing MusculoskeletalRight heel cord contracture still present-20 degrees today, mild low back tenderness    Assessment/Plan: 1. 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  Physiatrist is providing close team supervision and 24 hour management of active medical problems listed below.  Physiatrist and rehab team continue to assess barriers to discharge/monitor patient progress toward functional and medical goals  Care Tool:  Bathing    Body parts bathed by patient: Chest, Abdomen, Front perineal area, Face, Right arm, Right upper leg, Left upper leg   Body parts bathed by helper: Left arm, Buttocks, Right lower leg, Left lower leg Body parts n/a: Right upper leg, Left upper leg, Right lower leg, Left lower leg   Bathing assist Assist Level: Moderate Assistance - Patient 50 - 74%     Upper Body Dressing/Undressing Upper body dressing   What is the patient wearing?: Hospital gown only    Upper body assist Assist Level: Maximal Assistance - Patient 25 - 49%    Lower Body Dressing/Undressing Lower body dressing      What is the patient wearing?: Pants     Lower body assist Assist for lower body dressing: Maximal Assistance - Patient 25 - 49%     Toileting Toileting    Toileting assist Assist for toileting: Maximal Assistance - Patient 25 - 49%     Transfers Chair/bed transfer  Transfers assist     Chair/bed transfer assist level: Moderate Assistance - Patient 50 - 74%     Locomotion Ambulation   Ambulation assist   Ambulation activity did not occur: Safety/medical concerns (decreased motor control, R hemi)  Assist level: 2 helpers (mod assist + w/c follow for safety) Assistive device:  (rail) Max distance: 12 ft   Walk 10 feet activity   Assist  Walk 10 feet activity did not occur: Safety/medical concerns  Assist level: 2 helpers Assistive device:  (rail)   Walk 50 feet activity   Assist Walk 50 feet with 2 turns activity did not occur:  Safety/medical concerns         Walk 150 feet activity   Assist Walk 150 feet activity did not occur: Safety/medical concerns         Walk 10 feet on uneven surface  activity   Assist Walk 10 feet on uneven surfaces activity did not occur: Safety/medical concerns         Wheelchair     Assist Will patient use wheelchair at discharge?: Yes Type of Wheelchair: Manual Wheelchair activity did not occur: Safety/medical concerns (unable with and without skilled intervention due to poor motor planning and attention)  Wheelchair assist level: Moderate Assistance - Patient 50 - 74% Max wheelchair distance: 150 ft    Wheelchair 50 feet with 2 turns activity    Assist    Wheelchair 50 feet with 2 turns activity did not occur: Safety/medical concerns   Assist Level: Moderate Assistance - Patient 50 - 74%   Wheelchair 150 feet activity     Assist  Wheelchair 150 feet activity did not occur: Safety/medical concerns   Assist Level: Moderate Assistance - Patient 50 - 74%   Blood pressure (!) 146/66, pulse 95, temperature 98.5 F (36.9 C), temperature source Oral, resp. rate 18, height 5\' 2"  (1.575 m), weight 62.6 kg, last menstrual period 06/27/1972, SpO2 96 %.  Medical Problem List and Plan: 1. 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             -patient may shower             -ELOS/Goals: 28-32 days/Min A             -Continue CIR therapies with PT, OT, SLP  -adjustable tension night-splint/dorsi-flexion splint to address right heel cord contracture  2.  Antithrombotics: -DVT/anticoagulation:  Mechanical: Sequential compression devices, below knee Bilateral lower extremities             -antiplatelet therapy: None d/t  hemorrhage 3. Chronic neck pain/Pain Management: Uses  Hydrocodone prn at home-->Continue to hold given AMS  -schedule tylenol 500mg  tid  -kpad  -8/13 seemed to do better with HS scheduled hydrocodone (full tab)--continue  8/14: has some throbbing in right leg.  4. Mood: LCSW to follow for evaluation and support.              -antipsychotic agents: N/A 5. Neuropsych: This patient is not capable of making decisions on her own behalf. 6. Skin/Wound Care: Routine pressure relief measures.  7. Fluids/Electrolytes/Nutrition:   -labs seem to be holding steady today 8/13.    -K+ 4.1, BUN sl elevated   -recheck BMET monday  8. HTN: Monitor BP tid             Verapamil and lisinopril.                Vitals:   02/07/20 2006 02/08/20 0411  BP: 139/74 (!) 146/66  Pulse:  87 95  Resp: 18 18  Temp: 97.6 F (36.4 C) 98.5 F (36.9 C)  SpO2: 94% 96%  8/14: slightly elevated 9. Kleb pneumonia UTI: ceftriaxone x3 days and now on keflex to complete 7 day course. abx completed         -low grade temp--now afebrile  - f/u ucx negative 10. Delirum/AMS:  .   - resolving  -continue 12mg  seroquel at HS    11. Dysphagia:   -D3/thins, advance per SLP   13.  Small volume rectal bleeding likely traumatic due to hard stool  -increased stool softeners  -resolved 14.  Otitis externa with wax build up on right  -hold Corticosporin drops  -debrox drops--continue thru weekend  -patient reports drops have worsened the pain. Has associated vertigo. Ordered prn Meclizine and will ask therapy to perform Epley maneuver tomorrow   LOS: 10 days A FACE TO FACE EVALUATION WAS PERFORMED  Clide Deutscher Marionna Gonia 02/08/2020, 12:50 PM

## 2020-02-09 ENCOUNTER — Encounter (HOSPITAL_COMMUNITY): Payer: Medicare Other | Admitting: Occupational Therapy

## 2020-02-09 ENCOUNTER — Inpatient Hospital Stay (HOSPITAL_COMMUNITY): Payer: Medicare Other | Admitting: Speech Pathology

## 2020-02-09 MED ORDER — MECLIZINE HCL 25 MG PO TABS
12.5000 mg | ORAL_TABLET | Freq: Three times a day (TID) | ORAL | Status: DC
  Administered 2020-02-09 – 2020-02-20 (×27): 12.5 mg via ORAL
  Filled 2020-02-09 (×29): qty 1

## 2020-02-09 MED ORDER — HYDROCODONE-ACETAMINOPHEN 5-325 MG PO TABS
0.5000 | ORAL_TABLET | Freq: Every day | ORAL | Status: DC
  Administered 2020-02-09 – 2020-02-19 (×11): 0.5 via ORAL
  Filled 2020-02-09 (×11): qty 1

## 2020-02-09 NOTE — Progress Notes (Signed)
Speech Language Pathology Daily Session Note  Patient Details  Name: Kesleigh Morson MRN: [id number] Date of Birth: [birth year]  Today's Date: 02/09/2020 SLP Individual Time: 0930-1000 SLP Individual Time Calculation (min): 30 min  Short Term Goals: Week 2: SLP Short Term Goal 1 (Week 2): Pt will consume trials of regular textures demonstrating efficient mastication and oral clearance across 2 sessions with no more than min A cues for oral clearance of residue. SLP Short Term Goal 2 (Week 2): Pt will sustain attention to functional tasks for 15 mins with mod A verbal cues for redirection. SLP Short Term Goal 3 (Week 2): Patient will utilize external aids to recall daily information with Mod A verbal and visual cues. SLP Short Term Goal 4 (Week 2): Patient will demonstrate functional problem solving for basic and familiar tasks with Mod A verbal cues. SLP Short Term Goal 5 (Week 2): Patient will self-monitor and correct errors during functional tasks with Mod A verbal and visual cues.  Skilled Therapeutic Interventions: Pt was seen for skilled ST targeting cognitive goals. Pt independently oriented to date today, and able to direct SLP to location of her memory notebook and daily therapy schedule with only a question cue. She also accurately verbally recalled the 2 appointments she had today without need to reference schedule. SLP further facilitated session with a trail making task, during which pt required Mod A verbal and visual cues for sustained attention, problem solving, and recall within task. She also required extra time to complete the activity due to decreased attention. Memory notebook updated prior to leaving room. Pt left laying in bed with alarm set and needs within reach. Continue per current plan of care.          Pain Pain Assessment Pain Scale: Faces Faces Pain Scale: No hurt   Therapy/Group: Individual Therapy  Arbutus Leas 02/09/2020, 12:11 PM

## 2020-02-09 NOTE — Progress Notes (Signed)
[place] PHYSICAL MEDICINE & REHABILITATION PROGRESS NOTE   Subjective/Complaints: Ear pain somewhat improved but she is nervous to use ear drops as was told by her ENT not to get fluid in her ear. Discussed that she can refuse if she prefers but she is not sure what to do. Advised that she call her outpatient ENT to let her know current symptoms and can let us know what they advise.   ROS: Patient denies fever, rash, sore throat, blurred vision, nausea, vomiting, diarrhea, cough, shortness of breath or chest pain,   headache, or mood change.      Objective:   No results found. No results for input(s): WBC, HGB, HCT, PLT in the last 72 hours. Recent Labs    02/07/20 0706  NA 134*  K 4.1  CL 102  CO2 21*  GLUCOSE 106*  BUN 14  CREATININE 0.83  CALCIUM 9.2    Intake/Output Summary (Last 24 hours) at 02/09/2020 1400 Last data filed at 02/09/2020 1306 Gross per 24 hour  Intake 322 ml  Output --  Net 322 ml     Physical Exam: Vital Signs Blood pressure 121/60, pulse 74, temperature 98 F (36.7 C), temperature source Oral, resp. rate 20, height 5\' 2"  (1.575 m), weight 65.5 kg, last menstrual period 06/27/1972, SpO2 96 %. General: Alert and oriented x 3, No apparent distress HEENT: Head is normocephalic, atraumatic, PERRLA, EOMI, sclera anicteric, oral mucosa pink and moist, dentition intact, ext ear canals clear,  Neck: Supple without JVD or lymphadenopathy Heart: Reg rate and rhythm. No murmurs rubs or gallops Chest: CTA bilaterally without wheezes, rales, or rhonchi; no distress Abdomen: Soft, non-tender, non-distended, bowel sounds positive. Extremities: No clubbing, cyanosis, or edema. Pulses are 2+ Skin: Clean and intact without signs of breakdown Neuro: very alert. Oriented to person, place, reason. STM deficits. Language normal, speech intact.. right central 7. Right inattention. RUE and RLE 0/5. LUE/LLE 4-4+/5 prox to distal. Senses pain in right arm and leg. Flexor  tone RUE 1+/4. 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  Physiatrist is providing close team supervision and 24 hour management of active medical problems listed below.  Physiatrist and rehab team continue to assess barriers to discharge/monitor patient progress toward functional and medical goals  Care Tool:  Bathing    Body parts bathed by patient: Chest, Abdomen, Front perineal area, Face, Right arm, Right upper leg, Left upper leg   Body parts bathed by helper: Left arm, Buttocks, Right lower leg, Left lower leg Body parts n/a: Right upper leg, Left upper leg, Right lower leg, Left lower leg   Bathing assist Assist Level: Moderate Assistance - Patient 50 - 74%     Upper Body Dressing/Undressing Upper body dressing   What is the patient wearing?: Hospital gown only    Upper body assist Assist Level: Maximal Assistance - Patient 25 - 49%    Lower Body Dressing/Undressing Lower body dressing      What is the patient wearing?: Pants     Lower body assist Assist for lower body dressing: Maximal Assistance - Patient 25 - 49%     Toileting Toileting    Toileting assist Assist for toileting: Maximal Assistance - Patient 25 - 49%     Transfers Chair/bed  transfer  Transfers assist     Chair/bed transfer assist level: Moderate Assistance - Patient 50 - 74%     Locomotion Ambulation   Ambulation assist   Ambulation activity did not occur: Safety/medical concerns (decreased motor control, R hemi)  Assist level: 2 helpers (mod assist + w/c follow for safety) Assistive device:  (rail) Max distance: 12 ft   Walk 10 feet activity   Assist  Walk 10 feet activity  did not occur: Safety/medical concerns  Assist level: 2 helpers Assistive device:  (rail)   Walk 50 feet activity   Assist Walk 50 feet with 2 turns activity did not occur: Safety/medical concerns         Walk 150 feet activity   Assist Walk 150 feet activity did not occur: Safety/medical concerns         Walk 10 feet on uneven surface  activity   Assist Walk 10 feet on uneven surfaces activity did not occur: Safety/medical concerns         Wheelchair     Assist Will patient use wheelchair at discharge?: Yes Type of Wheelchair: Manual Wheelchair activity did not occur: Safety/medical concerns (unable with and without skilled intervention due to poor motor planning and attention)  Wheelchair assist level: Moderate Assistance - Patient 50 - 74% Max wheelchair distance: 150 ft    Wheelchair 50 feet with 2 turns activity    Assist    Wheelchair 50 feet with 2 turns activity did not occur: Safety/medical concerns   Assist Level: Moderate Assistance - Patient 50 - 74%   Wheelchair 150 feet activity     Assist  Wheelchair 150 feet activity did not occur: Safety/medical concerns   Assist Level: Moderate Assistance - Patient 50 - 74%   Blood pressure 121/60, pulse 74, temperature 98 F (36.7 C), temperature source Oral, resp. rate 20, height 5\' 2"  (1.575 m), weight 65.5 kg, last menstrual period 06/27/1972, SpO2 96 %.  Medical Problem List and Plan: 1. 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             -patient may shower             -ELOS/Goals: 28-32 days/Min A             -Continue CIR therapies with PT, OT, SLP  -adjustable tension night-splint/dorsi-flexion splint to address right heel cord contracture   2.  Antithrombotics: -DVT/anticoagulation:  Mechanical: Sequential compression devices, below knee Bilateral lower extremities             -antiplatelet therapy: None d/t hemorrhage 3. Chronic neck pain/Pain Management: Uses  Hydrocodone prn at home-->Continue to hold given AMS  -schedule tylenol 500mg  tid  -kpad  8/15: requests Norco HS be cut in half due to side effect of drowsiness.  4. Mood: LCSW to follow for evaluation and support.              -antipsychotic agents: N/A 5. Neuropsych: This patient is not capable of making decisions on her own behalf. 6. Skin/Wound Care: Routine pressure relief measures.  7. Fluids/Electrolytes/Nutrition:   -labs seem to be holding steady today 8/13.    -K+ 4.1, BUN sl elevated   -recheck BMET monday  8. HTN: Monitor BP tid  Verapamil and lisinopril.                Vitals:   02/08/20 2105 02/09/20 0448  BP: 140/72 121/60  Pulse: 80 74  Resp: 20 20  Temp: 99.6 F (37.6 C) 98 F (36.7 C)  SpO2: 94% 96%  8/15: well controlled 9. Kleb pneumonia UTI: ceftriaxone x3 days and now on keflex to complete 7 day course. abx completed         -low grade temp--now afebrile  - f/u ucx negative 10. Delirum/AMS:  .   - resolving  -continue 12mg  seroquel at HS    11. Dysphagia:   -D3/thins, advance per SLP   13.  Small volume rectal bleeding likely traumatic due to hard stool  -increased stool softeners  -resolved 14.  Otitis externa with wax build up on right  -hold Corticosporin drops  -debrox drops--continue thru weekend  -patient reports drops have worsened the pain. Has associated vertigo. Scheduled Meclizine.    LOS: 11 days A FACE TO FACE EVALUATION WAS PERFORMED  Kharee Lesesne P Raylie Maddison 02/09/2020, 2:00 PM

## 2020-02-09 NOTE — Progress Notes (Signed)
Occupational Therapy Session Note  Patient Details  Name: Alison Cuevas MRN: [id number] Date of Birth: [birth year]  Today's Date: 02/09/2020 OT Group Time: 1100-1200 OT Group Time Calculation (min): 60 min  Skilled Therapeutic Interventions/Progress Updates:    Pt engaged in therapeutic w/c level dance group focusing on patient choice, UE/LE strengthening, salience, activity tolerance, and social participation. Pt was guided through various dance-based exercises involving UEs/LEs and trunk. All music was selected by group members. Emphasis placed on Rt NMR, functional cognition, and activity tolerance. Pt with brightened affect throughout group, actively requesting songs and conversing with others. She stood with OT and Min A, using back of chair for standing support, swaying in beat to music while OT held onto the Rt hand. She followed instruction for active assist ROM exercises for the Rt UE/LE with min verbal and HOH cuing when seated. At end of session she was returned to room and left with all needs within reach and safety belt fastened.    Therapy Documentation Precautions:  Precautions Precautions: Fall Precaution Comments: Increased tone RUE (flexion)/RLE(extension) Required Braces or Orthoses: Cervical Brace Cervical Brace: Soft collar, For comfort Restrictions Weight Bearing Restrictions: Yes Pain: no s/s pain during session Pain Assessment Pain Scale: Faces Pain Score: 6  Faces Pain Scale: No hurt Pain Type: Acute pain Pain Location: Neck Pain Descriptors / Indicators: Aching Pain Onset: On-going Pain Intervention(s): Medication (See eMAR) ADL: ADL Upper Body Bathing: Maximal cueing, Moderate assistance Where Assessed-Upper Body Bathing: Wheelchair, Sitting at sink Lower Body Bathing: Maximal assistance, Maximal cueing Where Assessed-Lower Body Bathing: Bed level Upper Body Dressing: Maximal assistance, Maximal cueing Where Assessed-Upper Body Dressing: Sitting at sink,  Wheelchair Lower Body Dressing: Dependent Where Assessed-Lower Body Dressing: Bed level (able to bridge hips) Toilet Transfer: Dependent (stedy)      Therapy/Group: Group Therapy  Anabela Crayton A Darilyn Storbeck 02/09/2020, 12:37 PM

## 2020-02-09 NOTE — Progress Notes (Addendum)
Patient called due to discomfort in resting and to remove SCD's due to pain in BLE's especially to right leg. SCD 's removed , noted right leg cramping to calf area, patient verbalized discomfort. Denies need for additional medication for pain. repositioned several times and prn medication provided for sleep.Patient states she has concerns about placing ear drops to her ears since she was informed not to allow any fluids or water enter her ears Reaaurance to provided, encouraged to speak with her medical team on rounding. Monitor and assisted, sleep chart in place.

## 2020-02-10 ENCOUNTER — Inpatient Hospital Stay (HOSPITAL_COMMUNITY): Payer: Medicare Other

## 2020-02-10 ENCOUNTER — Inpatient Hospital Stay (HOSPITAL_COMMUNITY): Payer: Medicare Other | Admitting: Speech Pathology

## 2020-02-10 LAB — CBC WITH DIFFERENTIAL/PLATELET
Abs Immature Granulocytes: 0.01 10*3/uL (ref 0.00–0.07)
Basophils Absolute: 0 10*3/uL (ref 0.0–0.1)
Basophils Relative: 1 %
Eosinophils Absolute: 0.1 10*3/uL (ref 0.0–0.5)
Eosinophils Relative: 2 %
HCT: 37 % (ref 36.0–46.0)
Hemoglobin: 12.5 g/dL (ref 12.0–15.0)
Immature Granulocytes: 0 %
Lymphocytes Relative: 43 %
Lymphs Abs: 2.5 10*3/uL (ref 0.7–4.0)
MCH: 30.5 pg (ref 26.0–34.0)
MCHC: 33.8 g/dL (ref 30.0–36.0)
MCV: 90.2 fL (ref 80.0–100.0)
Monocytes Absolute: 0.5 10*3/uL (ref 0.1–1.0)
Monocytes Relative: 8 %
Neutro Abs: 2.7 10*3/uL (ref 1.7–7.7)
Neutrophils Relative %: 46 %
Platelets: 214 10*3/uL (ref 150–400)
RBC: 4.1 MIL/uL (ref 3.87–5.11)
RDW: 13.7 % (ref 11.5–15.5)
WBC: 5.9 10*3/uL (ref 4.0–10.5)
nRBC: 0 % (ref 0.0–0.2)

## 2020-02-10 LAB — BASIC METABOLIC PANEL
Anion gap: 8 (ref 5–15)
BUN: 12 mg/dL (ref 8–23)
CO2: 25 mmol/L (ref 22–32)
Calcium: 9.2 mg/dL (ref 8.9–10.3)
Chloride: 100 mmol/L (ref 98–111)
Creatinine, Ser: 0.96 mg/dL (ref 0.44–1.00)
GFR calc Af Amer: >60 mL/min (ref >60)
GFR calc non Af Amer: 58 mL/min — ABNORMAL LOW (ref >60)
Glucose, Bld: 105 mg/dL — ABNORMAL HIGH (ref 70–99)
Potassium: 4.1 mmol/L (ref 3.5–5.1)
Sodium: 133 mmol/L — ABNORMAL LOW (ref 135–145)

## 2020-02-10 MED ORDER — TIZANIDINE HCL 2 MG PO TABS
1.0000 mg | ORAL_TABLET | Freq: Every day | ORAL | Status: DC
  Administered 2020-02-10: 1 mg via ORAL
  Filled 2020-02-10: qty 1

## 2020-02-10 MED ORDER — CIPROFLOXACIN-DEXAMETHASONE 0.3-0.1 % OT SUSP
4.0000 [drp] | Freq: Two times a day (BID) | OTIC | Status: DC
  Administered 2020-02-10 – 2020-02-18 (×16): 4 [drp] via OTIC
  Filled 2020-02-10 (×2): qty 7.5

## 2020-02-10 NOTE — Progress Notes (Signed)
Physical Therapy Session Note  Patient Details  Name: Alison Cuevas MRN: [id number] Date of Birth: Apr 27, 1946  Today's Date: 02/10/2020 PT Individual Time: 7829-5621 PT Individual Time Calculation (min): 82 min   Short Term Goals: Week 2:  PT Short Term Goal 1 (Week 2): Patient will perform bed mobility with CGA. PT Short Term Goal 2 (Week 2): Patient will perform basic transfers with min A consitently. PT Short Term Goal 3 (Week 2): Patient will ambulate 50 feet using LRAD with mod A. PT Short Term Goal 4 (Week 2): Patient will perform standing balance >5 min with CGA.  Skilled Therapeutic Interventions/Progress Updates:     Patient in bed upon PT arrival. Patient alert and agreeable to PT session. Patient reported mild discomfort of R LE with spontaneous activation of DF with facilitation from kinesiotape during session, RN made aware and providing Tylenol following session. PT provided repositioning, rest breaks, and distraction as pain interventions throughout session.   Assessed R LE extensor tone in supine. Able to attain neutral DF with R knee and hip flexed, continued PF tone with knee extended. R UE in flexor tone positioning, provided prolonged stretch with patient initiating some triceps activation to assist with elbow extension.   Therapeutic Activity: Bed Mobility: Patient performed supine to sit with min A for trunk support and use of bed rail in a flat bed. Provided verbal cues for rolling R, patient initated bringing R LE off with L LE assist, cued patient to set R elbow then use both UEs to push up to sitting. She performed sit to supine with supervision in a flat bed with min use of bed rails without cues. She then performed scooting up in bed with facilitation for R LE placement in hook-lying to push her trunk up in the bed following cues.  Transfers: Patient performed sit to/from stand x5 with min A using L rail. Provided verbal cues for R foot placement, forward weight  shift, and R hip/knee extension in standing. Patient with poor weight shift to R initially leading to lifting R heel off the floor, improved with cues and facilitation. She performed stand pivot bed<>w/c with min A. On return, R LE extensor tone activated on descent causing retropulsion of patient's trunk leading to poor placement on the bed and PT blocking patient from sliding off. Able to reposition patient with min A and facilitating R knee flexion and blocking R knee and foot to scoot back.  Gait Training:  Patient ambulated 10 feet, 12, feet, and 30 feet x2 using L rail with min A for trunk support and mod A for R foot placement due to extensor tone and increased adductor tone with limb advancement. Ambulated with step-to progressing to step-to gait pattern leading with R. Provided verbal cues for erect posture and sequencing, and facilitation to prevent R extensor thrust and ankle inversion with multimodal cues. Trial 1 with shoes only, trial 2 with R DF wrap with eversion bias (improved foot clearance and reduced inversion, but increased adduction and ER of R hip), Trial 3&4 with R DF wrap with neutral foot placement (improved foot clearance, no increased inversion, and improved adduction and ER or R hip).   Wheelchair Mobility:  Patient was transported in the w/c with total A throughout session for energy conservation and time management.  Following gait training, removed kinesiotape from R LE, noted mild redness that resolved <5 min, otherwise no signs of skin irritation or breakdown. Washed skin and dried her R leg thoroughly before reapplying  Kinesiotape. Patient in sitting with R forefoot on 4" step for DF stretch. Placed kinesiotape for inhibition of soleus and gastroc (no stretch, applied distal to proximal) and facilitation of anterior tibialis and peroneous longus (25-50% stretch, applied proximal to distal). Immediately following, patient demonstrated spontaneous DF activation several times  both during removal of previous tape, during application, and following application. Noted improved eversion with peroneus longus facilitation. Patient denied any previous reaction to tape or adhesive prior to placement. Educated on signs and symptoms of reaction and instructed patient call to have it removed immediately if symptoms occurred. Educated on wearing tape for 3 days and that it is safe to shower with the tape on. Patient stated understanding with no signs of reaction to the skin after 5 min.   Patient in bed at end of session with breaks locked, bed alarm set, and all needs within reach. Patient with continued spontaneous facilitation of R DF at end of session. Educated patient on calling for Tylenol to reduce discomfort of muscle facilitation from kinesiotape and to have tape removed if she is unable to tolerate symptoms. Patient stated understanding.    Therapy Documentation Precautions:  Precautions Precautions: Fall Precaution Comments: Increased tone RUE (flexion)/RLE(extension) Restrictions Weight Bearing Restrictions: No   Therapy/Group: Individual Therapy  Loral Campi L Heru Montz PT, DPT  02/10/2020, 5:31 PM

## 2020-02-10 NOTE — Progress Notes (Signed)
Occupational Therapy Session Note  Patient Details  Name: Alison Cuevas MRN: [id number] Date of Birth: January 19, 1946  Today's Date: 02/10/2020 OT Individual Time: 3532-9924 OT Individual Time Calculation (min): 81 min    Short Term Goals: Week 2:  OT Short Term Goal 1 (Week 2): Pt will recall first step of UB hemi dressing with 1 VC OT Short Term Goal 2 (Week 2): Pt will thread BLE into pants with MIN A OT Short Term Goal 3 (Week 2): Pt will sit to stand with MOD A in prep for CM consistently OT Short Term Goal 4 (Week 2): Pt will maintain dynamic sitting balance with MIN A for 5 min during functional task  Skilled Therapeutic Interventions/Progress Updates:    Pt received supine, reporting pain overnight in R arm and leg from tone/spasms. Pt requesting to use the bathroom. Pt completed bed mobility to EOB requiring max A for RLE management and mod A overall to transition supine >sidelying > sitting EOB. Pt with poor static sitting balance, requiring LUE support on bed rail to remain seated upright. Pt's RLE with worsening tone compared to this clinicians last session with pt on 8/11. Pt requiring max facilitation to break extensor tone in RLE. Pt completed squat pivot transfer to the w/c with mod A. Pt was wheeled into bathroom and she completed stand pivot transfer to the [facility] over toilet with grab bar use with mod A. Max A for clothing management in standing. Small BM voided. Very difficult for OT to control R ankle inversion this session. Pt completed stand pivot transfer back to w/c with use of grab bar with mod A. Pt completed UB bathing at the sink with min A. Facilitation required to lift RUE, slight elbow flexion activation. Pt required min cueing for hemi technique when donning shirt. Mod A overall to don. Mod A to don pants as well. Pt requested assistance to wash hair in the sink. Max A provided while pt protected ear from water entering. Pt with several questions re ear drops and limitations  provided by her ENT re water entering. Pt requested OT assistance in calling ENT to clarify, as pt as been refusing ear drops. With pt's permission and pt listening in, OT spoke briefly with the RN on call to clarify instructions for pt. Care coordination with MD as well to convey information obtained. Pt was left sitting up in the w/c with RN and NT present assisting.   Therapy Documentation Precautions:  Precautions Precautions: Fall Precaution Comments: Increased tone RUE (flexion)/RLE(extension) Required Braces or Orthoses: Cervical Brace Cervical Brace: Soft collar, For comfort Restrictions Weight Bearing Restrictions: No  Therapy/Group: Individual Therapy  Curtis Sites 02/10/2020, 6:38 AM

## 2020-02-10 NOTE — Progress Notes (Signed)
Speech Language Pathology Daily Session Note  Patient Details  Name: Alison Cuevas MRN: [id number] Date of Birth: June 10, 1946  Today's Date: 02/10/2020 SLP Individual Time: 1245-8099 SLP Individual Time Calculation (min): 25 min  Short Term Goals: Week 2: SLP Short Term Goal 1 (Week 2): Pt will consume trials of regular textures demonstrating efficient mastication and oral clearance across 2 sessions with no more than min A cues for oral clearance of residue. SLP Short Term Goal 2 (Week 2): Pt will sustain attention to functional tasks for 15 mins with mod A verbal cues for redirection. SLP Short Term Goal 3 (Week 2): Patient will utilize external aids to recall daily information with Mod A verbal and visual cues. SLP Short Term Goal 4 (Week 2): Patient will demonstrate functional problem solving for basic and familiar tasks with Mod A verbal cues. SLP Short Term Goal 5 (Week 2): Patient will self-monitor and correct errors during functional tasks with Mod A verbal and visual cues.  Skilled Therapeutic Interventions: Skilled treatment session focused on cognitive goals. SLP facilitated session by providing extra time and overall Min A verbal cues for recall and problem solving during a mildly complex appointment/calendar making task. Patient appeared anxious throughout task asking, "Am I doing this right?" SLP provided ego support. Task will be completed during next session. Patient left upright in the wheelchair with alarm on and all needs within reach. Continue with current plan of care.      Pain No/Denies Pain   Therapy/Group: Individual Therapy  Alison Cuevas 02/10/2020, 10:23 AM

## 2020-02-10 NOTE — Progress Notes (Signed)
[place] PHYSICAL MEDICINE & REHABILITATION PROGRESS NOTE   Subjective/Complaints: Ear still with some fullness. Recalled that her ENT had told her not to get ear wet. She and OT apparently called the ENT's office this morning. Got debrox over weekend  ROS: Patient denies fever, rash, sore throat, blurred vision, nausea, vomiting, diarrhea, cough, shortness of breath or chest pain, joint or back pain, headache, or mood change.       Objective:   No results found. Recent Labs    02/10/20 0517  WBC 5.9  HGB 12.5  HCT 37.0  PLT 214   Recent Labs    02/10/20 0517  NA 133*  K 4.1  CL 100  CO2 25  GLUCOSE 105*  BUN 12  CREATININE 0.96  CALCIUM 9.2    Intake/Output Summary (Last 24 hours) at 02/10/2020 1130 Last data filed at 02/10/2020 4010 Gross per 24 hour  Intake 462 ml  Output --  Net 462 ml     Physical Exam: Vital Signs Blood pressure 138/63, pulse 95, temperature 99.6 F (37.6 C), temperature source Oral, resp. rate 18, height 5\' 2"  (1.575 m), weight 62.7 kg, last menstrual period 06/27/1972, SpO2 95 %. Constitutional: No distress . Vital signs reviewed. HEENT: EOMI, oral membranes moist Neck: supple Cardiovascular: RRR without murmur. No JVD    Respiratory/Chest: CTA Bilaterally without wheezes or rales. Normal effort    GI/Abdomen: BS +, non-tender, non-distended Ext: no clubbing, cyanosis, or edema Psych: pleasant and cooperative Neuro: oriented to person, place, recalled ENT's name. Delayed processing, impaired insight. Did not recall our conversations about ear last week.. right central 7. Right inattention. RUE and RLE 0/5. LUE/LLE 4-4+/5 prox to distal. Senses pain in right arm and leg. Flexor tone RUE 2/4. Extensor RLE with heel cord contracture ongoing still present MusculoskeletalRight heel cord contracture still present-20+ degrees , mild low back tenderness     Assessment/Plan: 1. 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  Physiatrist is providing close team supervision and 24 hour management of active medical problems listed below.  Physiatrist and rehab team continue to assess barriers to discharge/monitor patient progress toward functional and medical goals  Care Tool:  Bathing    Body parts bathed by patient: Chest, Abdomen, Front perineal area, Face, Right arm, Right upper leg, Left upper leg   Body parts bathed by helper: Left arm, Buttocks, Right lower leg, Left lower leg Body parts n/a: Right upper leg, Left upper leg, Right lower leg, Left lower leg   Bathing assist Assist Level: Moderate Assistance - Patient 50 - 74%     Upper Body Dressing/Undressing Upper body dressing   What is the patient wearing?: Hospital gown only    Upper body assist Assist Level: Maximal Assistance - Patient 25 - 49%    Lower Body Dressing/Undressing Lower body dressing      What is the patient wearing?: Pants     Lower body assist Assist for lower body dressing: Maximal Assistance - Patient 25 - 49%     Toileting Toileting    Toileting assist Assist for toileting: Maximal Assistance - Patient 25 - 49%     Transfers Chair/bed transfer  Transfers assist     Chair/bed transfer assist level: Moderate Assistance - Patient 50 - 74%     Locomotion Ambulation   Ambulation assist   Ambulation activity did not occur: Safety/medical concerns (decreased motor control, R  hemi)  Assist level: 2 helpers (mod assist + w/c follow for safety) Assistive device:  (rail) Max distance: 12 ft   Walk 10 feet activity   Assist  Walk 10 feet activity did not occur: Safety/medical concerns  Assist level: 2 helpers Assistive device:  (rail)   Walk 50 feet activity   Assist Walk 50 feet with 2 turns activity did not occur: Safety/medical concerns         Walk 150 feet activity   Assist Walk 150 feet  activity did not occur: Safety/medical concerns         Walk 10 feet on uneven surface  activity   Assist Walk 10 feet on uneven surfaces activity did not occur: Safety/medical concerns         Wheelchair     Assist Will patient use wheelchair at discharge?: Yes Type of Wheelchair: Manual Wheelchair activity did not occur: Safety/medical concerns (unable with and without skilled intervention due to poor motor planning and attention)  Wheelchair assist level: Moderate Assistance - Patient 50 - 74% Max wheelchair distance: 150 ft    Wheelchair 50 feet with 2 turns activity    Assist    Wheelchair 50 feet with 2 turns activity did not occur: Safety/medical concerns   Assist Level: Moderate Assistance - Patient 50 - 74%   Wheelchair 150 feet activity     Assist  Wheelchair 150 feet activity did not occur: Safety/medical concerns   Assist Level: Moderate Assistance - Patient 50 - 74%   Blood pressure 138/63, pulse 95, temperature 99.6 F (37.6 C), temperature source Oral, resp. rate 18, height 5\' 2"  (1.575 m), weight 62.7 kg, last menstrual period 06/27/1972, SpO2 95 %.  Medical Problem List and Plan: 1. 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             -patient may shower             -ELOS/Goals: 28-32 days/Min A             -Continue CIR therapies with PT, OT, SLP  -adjustable tension night-splint/dorsi-flexion splint to address right heel cord contracture   -8/16 will cautiously try low dose tizanidine, only at night 1mg  qhs 2.  Antithrombotics: -DVT/anticoagulation:  Mechanical: Sequential compression devices, below knee Bilateral lower extremities             -antiplatelet therapy: None d/t hemorrhage 3.  Chronic neck pain/Pain Management: Uses  Hydrocodone prn at home-->Continue to hold given AMS  -schedule tylenol 500mg  tid  -kpad  8/16 now only taking 1/2 norco at HS.  4. Mood: LCSW to follow for evaluation and support.              -antipsychotic agents: N/A 5. Neuropsych: This patient is not capable of making decisions on her own behalf. 6. Skin/Wound Care: Routine pressure relief measures.  7. Fluids/Electrolytes/Nutrition:   -labs all reviewed today   -BUN/Cr 12, 4.1 8/16      8. HTN: Monitor BP tid             Verapamil and lisinopril.                Vitals:   02/09/20 2046 02/10/20 0528  BP: 135/70 138/63  Pulse: 80 95  Resp: 18  18  Temp: 97.6 F (36.4 C) 99.6 F (37.6 C)  SpO2: 99% 95%  8/16: well controlled 9. Kleb pneumonia UTI: ceftriaxone x3 days and now on keflex to complete 7 day course. abx completed         -low grade temp--now afebrile  - f/u ucx negative 10. Delirum/AMS:  .   - resolving  -continue 12mg  seroquel at HS    11. Dysphagia:   -D3/thins, advance per SLP   13.  Small volume rectal bleeding likely traumatic due to hard stool  -increased stool softeners  -moving bowels 14.  Otitis externa with wax build up on right  -hold Corticosporin drops  -debrox drops  -8/16  still with fullness, ?pain. Doesn't remember symptoms consistently to be honest. Will look at ear later this morning.    -debrox drops stopped    LOS: 12 days A FACE TO [place] 02/10/2020, 11:30 AM

## 2020-02-11 ENCOUNTER — Inpatient Hospital Stay (HOSPITAL_COMMUNITY): Payer: Medicare Other

## 2020-02-11 ENCOUNTER — Inpatient Hospital Stay (HOSPITAL_COMMUNITY): Payer: Medicare Other | Admitting: Occupational Therapy

## 2020-02-11 LAB — URINALYSIS, COMPLETE (UACMP) WITH MICROSCOPIC
Bilirubin Urine: NEGATIVE
Glucose, UA: NEGATIVE mg/dL
Hgb urine dipstick: NEGATIVE
Ketones, ur: NEGATIVE mg/dL
Nitrite: NEGATIVE
Protein, ur: NEGATIVE mg/dL
Specific Gravity, Urine: 1.014 (ref 1.005–1.030)
pH: 5 (ref 5.0–8.0)

## 2020-02-11 MED ORDER — TIZANIDINE HCL 2 MG PO TABS
2.0000 mg | ORAL_TABLET | Freq: Every day | ORAL | Status: DC
  Administered 2020-02-11: 2 mg via ORAL
  Filled 2020-02-11: qty 1

## 2020-02-11 NOTE — Progress Notes (Signed)
[place] PHYSICAL MEDICINE & REHABILITATION PROGRESS NOTE   Subjective/Complaints: Had a reasonable night per nursing. Slept most of it. Woke up with spasms in RLE.   ROS: Patient denies fever, rash, sore throat, blurred vision, nausea, vomiting, diarrhea, cough, shortness of breath or chest pain,  headache, or mood change.     Objective:   No results found. Recent Labs    02/10/20 0517  WBC 5.9  HGB 12.5  HCT 37.0  PLT 214   Recent Labs    02/10/20 0517  NA 133*  K 4.1  CL 100  CO2 25  GLUCOSE 105*  BUN 12  CREATININE 0.96  CALCIUM 9.2    Intake/Output Summary (Last 24 hours) at 02/11/2020 1101 Last data filed at 02/11/2020 0430 Gross per 24 hour  Intake 840 ml  Output --  Net 840 ml     Physical Exam: Vital Signs Blood pressure 130/67, pulse 89, temperature 98 F (36.7 C), temperature source Oral, resp. rate 16, height 5\' 2"  (1.575 m), weight 62.4 kg, last menstrual period 06/27/1972, SpO2 97 %. Constitutional: No distress . Vital signs reviewed. HEENT: EOMI, oral membranes moist Neck: supple Cardiovascular: RRR without murmur. No JVD    Respiratory/Chest: CTA Bilaterally without wheezes or rales. Normal effort    GI/Abdomen: BS +, non-tender, non-distended Ext: no clubbing, cyanosis, or edema Psych: pleasant and cooperative Neuro: remembered yesterday's discussion. right central 7. Right inattention. RUE and RLE 0 to tr/5. LUE/LLE 4-4+/5 prox to distal. Senses pain in right arm and leg. Flexor tone RUE 1/4. Extensor RLE with heel cord contracture ongoing still present MusculoskeletalRight heel cord contracture still present-10-20 degrees , mild low back tenderness still present    Assessment/Plan: 1. 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  Physiatrist is providing close team supervision and 24 hour management of active medical problems  listed below.  Physiatrist and rehab team continue to assess barriers to discharge/monitor patient progress toward functional and medical goals  Care Tool:  Bathing    Body parts bathed by patient: Chest, Abdomen, Front perineal area, Face, Right arm, Right upper leg, Left upper leg   Body parts bathed by helper: Left arm, Buttocks, Right lower leg, Left lower leg Body parts n/a: Right upper leg, Left upper leg, Right lower leg, Left lower leg   Bathing assist Assist Level: Moderate Assistance - Patient 50 - 74%     Upper Body Dressing/Undressing Upper body dressing   What is the patient wearing?: Hospital gown only    Upper body assist Assist Level: Maximal Assistance - Patient 25 - 49%    Lower Body Dressing/Undressing Lower body dressing      What is the patient wearing?: Pants     Lower body assist Assist for lower body dressing: Maximal Assistance - Patient 25 - 49%     Toileting Toileting    Toileting assist Assist for toileting: Maximal Assistance - Patient 25 - 49%     Transfers Chair/bed transfer  Transfers assist     Chair/bed transfer assist level: Moderate Assistance - Patient 50 - 74%     Locomotion Ambulation   Ambulation assist   Ambulation activity did not occur: Safety/medical concerns (decreased motor control, R hemi)  Assist level: 2 helpers (mod assist + w/c follow for safety) Assistive device:  (rail) Max distance: 12 ft   Walk 10 feet activity   Assist  Walk 10  feet activity did not occur: Safety/medical concerns  Assist level: 2 helpers Assistive device:  (rail)   Walk 50 feet activity   Assist Walk 50 feet with 2 turns activity did not occur: Safety/medical concerns         Walk 150 feet activity   Assist Walk 150 feet activity did not occur: Safety/medical concerns         Walk 10 feet on uneven surface  activity   Assist Walk 10 feet on uneven surfaces activity did not occur: Safety/medical  concerns         Wheelchair     Assist Will patient use wheelchair at discharge?: Yes Type of Wheelchair: Manual Wheelchair activity did not occur: Safety/medical concerns (unable with and without skilled intervention due to poor motor planning and attention)  Wheelchair assist level: Moderate Assistance - Patient 50 - 74% Max wheelchair distance: 150 ft    Wheelchair 50 feet with 2 turns activity    Assist    Wheelchair 50 feet with 2 turns activity did not occur: Safety/medical concerns   Assist Level: Moderate Assistance - Patient 50 - 74%   Wheelchair 150 feet activity     Assist  Wheelchair 150 feet activity did not occur: Safety/medical concerns   Assist Level: Moderate Assistance - Patient 50 - 74%   Blood pressure 130/67, pulse 89, temperature 98 F (36.7 C), temperature source Oral, resp. rate 16, height 5\' 2"  (1.575 m), weight 62.4 kg, last menstrual period 06/27/1972, SpO2 97 %.  Medical Problem List and Plan: 1. 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             -patient may shower             -ELOS/Goals: 28-32 days/Min A             -Continue CIR therapies with PT, OT, SLP  -adjustable tension night-splint/dorsi-flexion splint to address right heel cord contracture   -8/17 Tolerated 1mg  tizanidine--will try 2mg  qhs tonight to see if we can make further progress with her tone 2.  Antithrombotics: -DVT/anticoagulation:  Mechanical: Sequential compression devices, below knee Bilateral lower extremities             -antiplatelet therapy: None d/t hemorrhage 3. Chronic neck pain/Pain Management: Uses  Hydrocodone prn at home-->Continue to hold given AMS  -schedule tylenol 500mg  tid  -kpad  - now only taking  1/2 norco at HS.  4. Mood: LCSW to follow for evaluation and support.              -antipsychotic agents: N/A 5. Neuropsych: This patient is not capable of making decisions on her own behalf. 6. Skin/Wound Care: Routine pressure relief measures.  7. Fluids/Electrolytes/Nutrition:   -labs all reviewed today   -BUN/Cr 12, 4.1 8/16    -intake is improving. Ate 80-100% yesterday  8. HTN: Monitor BP tid             Verapamil and lisinopril.                Vitals:   02/10/20 1957 02/11/20 0450  BP: (!) 134/57 130/67  Pulse: 83 89  Resp: 18 16  Temp:  98 F (36.7 C)  SpO2: 95% 97%  8/17: well controlled 9. Kleb pneumonia UTI:  ceftriaxone x3 days and now on keflex to complete 7 day course. abx completed         -low grade temp--now afebrile  - f/u ucx negative 10. Delirum/AMS:  .   - resolving  -continue 12mg  seroquel at HS    11. Dysphagia:   -D3/thins, advance per SLP   13.  Small volume rectal bleeding likely traumatic due to hard stool  -increased stool softeners  -moving bowels 14.  Otitis externa with wax build up on right  S/p debrox drops---wax resolved  -8/16--resumed ciprodex ear gtts per recs of her home ENT       LOS: 13 days A FACE TO FACE EVALUATION WAS PERFORMED  Meredith Staggers 02/11/2020, 11:01 AM

## 2020-02-11 NOTE — Progress Notes (Signed)
Speech Language Pathology Daily Session Note  Patient Details  Name: Chesley Valls MRN: [id number] Date of Birth: [birth year]  Today's Date: 02/11/2020 SLP Individual Time: 1030-1130 SLP Individual Time Calculation (min): 60 min  Short Term Goals: Week 2: SLP Short Term Goal 1 (Week 2): Pt will consume trials of regular textures demonstrating efficient mastication and oral clearance across 2 sessions with no more than min A cues for oral clearance of residue. SLP Short Term Goal 2 (Week 2): Pt will sustain attention to functional tasks for 15 mins with mod A verbal cues for redirection. SLP Short Term Goal 3 (Week 2): Patient will utilize external aids to recall daily information with Mod A verbal and visual cues. SLP Short Term Goal 4 (Week 2): Patient will demonstrate functional problem solving for basic and familiar tasks with Mod A verbal cues. SLP Short Term Goal 5 (Week 2): Patient will self-monitor and correct errors during functional tasks with Mod A verbal and visual cues.  Skilled Therapeutic Interventions:   Skilled SLP intervention focused on cognition and dysphagia. Trials completed with regular solids at beginning of session. Pt demonstrated adequate mastication and oral clearance. No overt s/sx of aspiration or penetration noted. Min A needed to recall therapy tasks completed using memory book. Pt reported continued difficulty with recalling specific therapy tasks on her own after reading information in memory book. Sustained attention task completed with moderate A with visual cues to redirect and with sequencing steps for task. Pt often lost track and needed visual cue to complete in correct order. Pt left seated upright in chair with chair alarm on and call bell and needs within reach. Continue with dysphagia 3 diet. Consider trial regular tray in next 1-2 sessions. Cont with therapy per plan of care.   Pain Pain Assessment Pain Scale: Faces Faces Pain Scale: No  hurt  Therapy/Group: Individual Therapy  Darrol Poke Sael Furches 02/11/2020, 12:10 PM

## 2020-02-11 NOTE — Patient Care Conference (Signed)
Inpatient RehabilitationTeam Conference and Plan of Care Update Date: 02/11/2020   Time: 10:19 AM    Patient Name: Alison Cuevas      Medical Record Number: [id number]  Date of Birth: 03/22/1946 Sex: Female         Room/Bed: 4W13C/4W13C-01 Payor Info: Payor: MEDICARE / Plan: MEDICARE PART A AND B / Product Type: *No Product type* /    Admit Date/Time:  01/29/2020  5:05 PM  Primary Diagnosis:  ICH (intracerebral hemorrhage) [facility])  Hospital Problems: Principal Problem:   ICH (intracerebral hemorrhage) ([place]) - L frontoparietal, hypertensive    Expected Discharge Date: Expected Discharge Date: 02/27/20  Team Members Present: Physician leading conference: Dr. Alger Simons Care Coodinator Present: Loralee Pacas, LCSWA;Breanna Mcdaniel Creig Hines, RN, BSN, [place] Nurse Present: Benjie Karvonen, RN PT Present: Apolinar Junes, PT OT Present: Laverle Hobby, OT SLP Present: Weston Anna, SLP PPS Coordinator present : Ileana Ladd, Burna Mortimer, SLP     Current Status/Progress Goal Weekly Team Focus  Bowel/Bladder   Continent x2; last BM 8/16  To remain continent; have PVR <362mL consistently after voiding  Assess every shift and as needed   Swallow/Nutrition/ Hydration   Dys. 3 textures with thin liquids, Intermittent supervision  supervision A  trials of regular textures   ADL's   Mod A UB/LB dressing, mod A stand pivot transfers, tone increase in RLE/RUE. R elbow has active flexion with assisted positioning in extension.  min A overall for ADLs  RUE NMR, ADL retraining, d/c planning, functional activity tolerance, transfers   Mobility   Min-mod A overall, gait 30 ft with L rail, limited by R LE extensor tone  Min A overall  Weight bearing for extensor tone management, functional mobility, gait training, w/c mobility, R UE/LE NMR, activity tolerance, patient/caregiver education   Communication             Safety/Cognition/ Behavioral Observations  Mod A  Min A  problem solving, recall  with use of strategies   Pain   Chronic neck pain 7/10 at beginning of shift (meds per [facility] given, repositioning, warm packs provided)  Pain <5  Assess every shift and as needed   Skin   Intact  Prevent skin breakdown  Assess every shift and as needed     Discharge Planning:  Discharge to home with husband, daughter available to provide care for mother at discharge.   Team Discussion: Continent B/B. Slept well last night. 10/10, burning with urination, MD to add orders for UA. Right elbow is having some active flexion. Pt is min assist stand pivot. Extensor tone in RLE. Continuing gait training. Making good progress with OT and PT. SLP working on recall, memory improving, thinking pt tends to over think things and this is what increases her anxiety level.  Patient on target to meet rehab goals: yes  *See Care Plan and progress notes for long and short-term goals.   Revisions to Treatment Plan:  None  Teaching Needs: Continue with family education  Current Barriers to Discharge: Decreased caregiver support and Lack of/limited family support  Possible Resolutions to Barriers: Family is not sure at this time about pt discharging to home. Continue with family education.     Medical Summary Current Status: ongoing right hp with persistent spasms, more involving RUE now. cognitively improved but still with STM deficits. new dysuria  Barriers to Discharge: Medical stability   Possible Resolutions to Barriers/Weekly Focus: daily adjustment of meds for pain and tone, re-check urine cx.   Continued Need  for Acute Rehabilitation Level of Care: The patient requires daily medical management by a physician with specialized training in physical medicine and rehabilitation for the following reasons: Direction of a multidisciplinary physical rehabilitation program to maximize functional independence : Yes Medical management of patient stability for increased activity during participation in an  intensive rehabilitation regime.: Yes Analysis of laboratory values and/or radiology reports with any subsequent need for medication adjustment and/or medical intervention. : Yes   I attest that I was present, lead the team conference, and concur with the assessment and plan of the team.   Cristi Loron 02/11/2020, 2:09 PM

## 2020-02-11 NOTE — Progress Notes (Signed)
Occupational Therapy Session Note  Patient Details  Name: Alison Cuevas MRN: [id number] Date of Birth: 1945/08/28  Today's Date: 02/11/2020 OT Individual Time: 8309-4076 OT Individual Time Calculation (min): 30 min   Session 2: OT Individual Time: 1415-1500 OT Individual Time Calculation (min): 45 min    Short Term Goals: Week 2:  OT Short Term Goal 1 (Week 2): Pt will recall first step of UB hemi dressing with 1 VC OT Short Term Goal 2 (Week 2): Pt will thread BLE into pants with MIN A OT Short Term Goal 3 (Week 2): Pt will sit to stand with MOD A in prep for CM consistently OT Short Term Goal 4 (Week 2): Pt will maintain dynamic sitting balance with MIN A for 5 min during functional task  Skilled Therapeutic Interventions/Progress Updates:    Pt received supine, c/o stiffness from lying in bed and requesting to use the bathroom. Pt transitioned to EOB with mod A for facilitating RLE and lifting trunk. Pt completed squat pivot transfer toward the L with min A- big improvement! Pt was taken into the bathroom via w/c and she completed another squat pivot with grab bar use with min A. Pt stood from [facility] with min A and blocking of R knee for mod A clothing management. Pt voided loose stool and completed peri hygiene seated. Pt c/o 10/10 pain burning while urinating. Pt edu on wiping front to back for UTI prevention/hygiene. Pt stood and new brief was donned with mod standing balance assist. Pt completed stand pivot toward the R with mod A and facilitation for RLE. Pt completed oral care at the sink with set up assist. Pt was left sitting up in w/c with chair alarm set.   Session 2: Pt received supine with c/o stiffness from lying in bed again. Pt really wanting to wash hair but also very anxious re water getting in her R ear. Assured pt this could be done safely and with care using the hair washing tray in the sink. Pt completed bed mobility with min A to EOB. Pt completed stand pivot transfer to  the w/c with min A. Pt scooted back in the chair with only (S), improved R hip scooting. Pt was backed up to the sink and care was taken to protect her R ear with both cotton and a little cup taped over. Her hair was washed with max A. Ear kept dry the entire time to pt's satisfaction. Discussed d/c planning with pt, including potential for SNF d/c. Pt completed oral care at the sink with set up assist. Pt's RUE was passively stretched through all planes of motion, with a focus on external rotation and chest opening to break up flexor tone. Pt reported intermittent pain with ER but it was alleviated with rest. Pt's sister entered and provided encouragement to pt. Pt was left sitting up in the w/c with all needs met, chair alarm set.   Therapy Documentation Precautions:  Precautions Precautions: Fall Precaution Comments: Increased tone RUE (flexion)/RLE(extension) Required Braces or Orthoses: Cervical Brace Cervical Brace: Soft collar, For comfort Restrictions Weight Bearing Restrictions: No   Therapy/Group: Individual Therapy  Curtis Sites 02/11/2020, 6:50 AM

## 2020-02-11 NOTE — Progress Notes (Signed)
Physical Therapy Session Note  Patient Details  Name: Alison Cuevas MRN: [id number] Date of Birth: June 01, 1946  Today's Date: 02/11/2020 PT Individual Time: 0800-0905 PT Individual Time Calculation (min): 65 min   Short Term Goals: Week 2:  PT Short Term Goal 1 (Week 2): Patient will perform bed mobility with CGA. PT Short Term Goal 2 (Week 2): Patient will perform basic transfers with min A consitently. PT Short Term Goal 3 (Week 2): Patient will ambulate 50 feet using LRAD with mod A. PT Short Term Goal 4 (Week 2): Patient will perform standing balance >5 min with CGA.  Skilled Therapeutic Interventions/Progress Updates:     Patient in w/c with RN in the room providing morning medications upon PT arrival. Patient alert and agreeable to PT session. Patient denied pain during session. Patient's breakfast tray had not arrived this morning, patient on the call ordering breakfast at beginning of session.   Therapeutic Activity: Bed Mobility: Patient performed sit to supine with min A for LE manamgnet. Provided verbal cues for bringing knees to chest to lift LEs onto the bed. Transfers: Patient performed a transfer to the bathroom in the [facility] for time management due to urgency. Patient was continent of bowl and bladder and required total A for peri-care and max A for LB dressing. She performed sit to/from stand x with 2 in the [place] to wash her L hand and brush her teeth with set-up assist. Required CGA-min A for standing balance 1-2 min each trial. Provided verbal cues for forward weight shift and R quad activation with R knee and hip extension in standing. Patient performed stand pivot bed<>w/c with min-mod A, increased assist due to R LE extensor tone causing posterior LOB x1. Provided cues for R foot placement, forward weight shift, blocking R foot and knee, and sequencing.   Patient's breakfast tray came, transferred patient back to the w/c to eat. Patient ate breakfast with set-up assist. PT  applied R DF stretch with knee bent with A/P glide to talus and with knee straight , able to to achieve DF past neutral with knee bent and to neutral with knee straight. Each stretch applied 2x1 min. MD rounded during session discussed patient's progress and management of R UE/LE hypertonia with MD and patient. Patient completed eating her breakfast and requested to return to bed, as above.  Patient in bed at end of session with breaks locked, bed alarm set, and all needs within reach.    Therapy Documentation Precautions:  Precautions Precautions: Fall Precaution Comments: Increased tone RUE (flexion)/RLE(extension) Required Braces or Orthoses: Cervical Brace Cervical Brace: Soft collar, For comfort Restrictions Weight Bearing Restrictions: No   Therapy/Group: Individual Therapy  Alison Cuevas PT, DPT  02/11/2020, 12:51 PM

## 2020-02-11 NOTE — Progress Notes (Signed)
Patient ID: Alison Cuevas, female   DOB: 1946-05-03, 74 y.o.   MRN: [id number]  SW received phone call from pt dtrTisha [phone]) to provide updates from team conference, and discuss best plan of care. Based on pt care needs, not sure if their father will be able to manage her care needs, and would like to know options. SW discussed home with [facility] and can hire private aide services, and/or short term rehab in SNF. States they will discuss with patient after they discuss with their dad.   Loralee Pacas, MSW, [place] Office: [phone] Cell: [phone] Fax: [phone]

## 2020-02-12 ENCOUNTER — Inpatient Hospital Stay (HOSPITAL_COMMUNITY): Payer: Medicare Other

## 2020-02-12 ENCOUNTER — Inpatient Hospital Stay (HOSPITAL_COMMUNITY): Payer: Medicare Other | Admitting: Speech Pathology

## 2020-02-12 LAB — URINE CULTURE: Culture: 40000 — AB

## 2020-02-12 MED ORDER — LIDOCAINE 5 % EX PTCH
1.0000 | MEDICATED_PATCH | Freq: Every day | CUTANEOUS | Status: DC | PRN
  Administered 2020-02-15 – 2020-02-18 (×2): 1 via TRANSDERMAL
  Filled 2020-02-12 (×4): qty 1

## 2020-02-12 NOTE — Progress Notes (Signed)
[place] PHYSICAL MEDICINE & REHABILITATION PROGRESS NOTE   Subjective/Complaints: Had some spasms at night. She did not feel the Tizanidine helped but is willing to try it again.  She would like her right ear to be checked. Drops have not helped. Her ear feels "stopped up and not like it used to feel since the stroke" Has some neck pain and has been using heating pad.  ROS: Patient denies fever, rash, sore throat, blurred vision, nausea, vomiting, diarrhea, cough, shortness of breath or chest pain,  headache, or mood change.     Objective:   No results found. Recent Labs    02/10/20 0517  WBC 5.9  HGB 12.5  HCT 37.0  PLT 214   Recent Labs    02/10/20 0517  NA 133*  K 4.1  CL 100  CO2 25  GLUCOSE 105*  BUN 12  CREATININE 0.96  CALCIUM 9.2    Intake/Output Summary (Last 24 hours) at 02/12/2020 0835 Last data filed at 02/11/2020 2018 Gross per 24 hour  Intake 578 ml  Output --  Net 578 ml     Physical Exam: Vital Signs Blood pressure 138/69, pulse (!) 103, temperature 97.7 F (36.5 C), temperature source Oral, resp. rate 16, height 5\' 2"  (1.575 m), weight 55.7 kg, last menstrual period 06/27/1972, SpO2 96 %. General: Alert and oriented x 3, No apparent distress HEENT: Head is normocephalic, atraumatic, PERRLA, EOMI, sclera anicteric, oral mucosa pink and moist, dentition intact, ext ear canals clear,  Neck: Supple without JVD or lymphadenopathy Heart: Reg rate and rhythm. No murmurs rubs or gallops Chest: CTA bilaterally without wheezes, rales, or rhonchi; no distress Abdomen: Soft, non-tender, non-distended, bowel sounds positive. Extremities: No clubbing, cyanosis, or edema. Pulses are 2+ Skin: Clean and intact without signs of breakdown Neuro: remembered yesterday's discussion. right central 7. Right inattention. RUE and RLE 0 to tr/5. LUE/LLE 4-4+/5 prox to distal. Senses pain in right arm and leg. Flexor tone RUE 1/4. Extensor RLE with heel cord contracture  ongoing still present MusculoskeletalRight heel cord contracture still present-10-20 degrees , mild low back tenderness still present  Assessment/Plan: 1. 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  Physiatrist is providing close team supervision and 24 hour management of active medical problems listed below.  Physiatrist and rehab team continue to assess barriers to discharge/monitor patient progress toward functional and medical goals  Care Tool:  Bathing    Body parts bathed by patient: Chest, Abdomen, Front perineal area, Face, Right arm, Right upper leg, Left upper leg   Body parts bathed by helper: Left arm, Buttocks, Right lower leg, Left lower leg Body parts n/a: Right upper leg, Left upper leg, Right lower leg, Left lower leg   Bathing assist Assist Level: Moderate Assistance - Patient 50 - 74%     Upper Body Dressing/Undressing Upper body dressing   What is the patient wearing?: Hospital gown only    Upper body assist Assist Level: Maximal Assistance - Patient 25 - 49%    Lower Body Dressing/Undressing Lower body dressing      What is the patient wearing?: Pants     Lower body assist Assist for lower body dressing: Maximal Assistance - Patient 25 - 49%     Toileting Toileting    Toileting assist Assist for toileting: Maximal Assistance - Patient 25 - 49%     Transfers Chair/bed transfer  Transfers assist  Chair/bed transfer assist level: Moderate Assistance - Patient 50 - 74%     Locomotion Ambulation   Ambulation assist   Ambulation activity did not occur: Safety/medical concerns (decreased motor control, R hemi)  Assist level: 2 helpers (mod assist + w/c follow for safety) Assistive device:  (rail) Max distance: 12 ft   Walk 10 feet activity   Assist  Walk 10 feet activity did not occur: Safety/medical concerns  Assist  level: 2 helpers Assistive device:  (rail)   Walk 50 feet activity   Assist Walk 50 feet with 2 turns activity did not occur: Safety/medical concerns         Walk 150 feet activity   Assist Walk 150 feet activity did not occur: Safety/medical concerns         Walk 10 feet on uneven surface  activity   Assist Walk 10 feet on uneven surfaces activity did not occur: Safety/medical concerns         Wheelchair     Assist Will patient use wheelchair at discharge?: Yes Type of Wheelchair: Manual Wheelchair activity did not occur: Safety/medical concerns (unable with and without skilled intervention due to poor motor planning and attention)  Wheelchair assist level: Moderate Assistance - Patient 50 - 74% Max wheelchair distance: 150 ft    Wheelchair 50 feet with 2 turns activity    Assist    Wheelchair 50 feet with 2 turns activity did not occur: Safety/medical concerns   Assist Level: Moderate Assistance - Patient 50 - 74%   Wheelchair 150 feet activity     Assist  Wheelchair 150 feet activity did not occur: Safety/medical concerns   Assist Level: Moderate Assistance - Patient 50 - 74%   Blood pressure 138/69, pulse (!) 103, temperature 97.7 F (36.5 C), temperature source Oral, resp. rate 16, height 5\' 2"  (1.575 m), weight 55.7 kg, last menstrual period 06/27/1972, SpO2 96 %.  Medical Problem List and Plan: 1. 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             -patient may shower             -ELOS/Goals: 28-32 days/Min A             -Continue CIR therapies with PT, OT, SLP  -adjustable tension night-splint/dorsi-flexion splint to address right heel cord contracture   -8/17 Tolerated 1mg  tizanidine--will  try 2mg  qhs tonight to see if we can make further progress with her tone  8/18: Tizanidine did not help muscle spasms at night but has helped with the tone in her right leg.  2.  Antithrombotics: -DVT/anticoagulation:  Mechanical: Sequential compression devices, below knee Bilateral lower extremities             -antiplatelet therapy: None d/t hemorrhage 3. Chronic neck pain/Pain Management: Uses  Hydrocodone prn at home-->Continue to hold given AMS  -schedule tylenol 500mg  tid  -kpad  - now only taking 1/2 norco at HS.  4. Mood: LCSW to follow for evaluation and support.              -antipsychotic agents: N/A 5. Neuropsych: This patient is not capable of making decisions on her own behalf. 6. Skin/Wound Care: Routine pressure relief measures.  7. Fluids/Electrolytes/Nutrition:   -labs all reviewed today   -  BUN/Cr 12, 4.1 8/16    -intake is improving. Ate 80-100% yesterday  8. HTN: Monitor BP tid             Verapamil and lisinopril.                Vitals:   02/11/20 1938 02/12/20 0341  BP: 135/65 138/69  Pulse: 84 (!) 103  Resp: 15 16  Temp: 98.3 F (36.8 C) 97.7 F (36.5 C)  SpO2: 97% 96%  8/18: well controlled 9. Kleb pneumonia UTI: ceftriaxone x3 days and now on keflex to complete 7 day course. abx completed         -low grade temp--now afebrile  - f/u ucx negative 10. Delirum/AMS:  .   - resolving  -continue 12mg  seroquel at HS    11. Dysphagia:   -D3/thins, advance per SLP   13.  Small volume rectal bleeding likely traumatic due to hard stool  -increased stool softeners  -moving bowels 14.  Otitis externa with wax build up on right  S/p debrox drops---wax resolved  -8/16--resumed ciprodex ear gtts per recs of her home ENT  8/18: discussed with patient and she is pleased with this.        LOS: 14 days A FACE TO FACE EVALUATION WAS PERFORMED  Clide Deutscher Shenique Childers 02/12/2020, 8:35 AM

## 2020-02-12 NOTE — Progress Notes (Signed)
Occupational Therapy Weekly Progress Note  Patient Details  Name: Alison Cuevas MRN: [id number] Date of Birth: 10-03-1945  Beginning of progress report period: February 06, 2020 End of progress report period: February 12, 2020  Today's Date: 02/12/2020 OT Individual Time: 1000-1100 OT Individual Time Calculation (min): 60 min    Patient has met 2 of 3 short term goals.  Pt has made steady progress this reporting period. Pt's RUE has progressed from a Brunnstrom 1 to a 2, with emerging movement. Pt is able to complete UB and LB ADLs with mod A. Her transfers have been more consistently min A for stand pivots. Ongoing education and discussion with pt's family to determine d/c location, home vs SNF.   Patient continues to demonstrate the following deficits: muscle weakness and muscle joint tightness, abnormal tone, unbalanced muscle activation and decreased coordination, decreased attention to right, decreased memory and delayed processing and decreased standing balance, decreased postural control and decreased balance strategies and therefore will continue to benefit from skilled OT intervention to enhance overall performance with BADL and Reduce care partner burden.  Patient progressing toward long term goals..  Continue plan of care.  OT Short Term Goals Week 2:  OT Short Term Goal 1 (Week 2): Pt will recall first step of UB hemi dressing with 1 VC OT Short Term Goal 1 - Progress (Week 2): Met OT Short Term Goal 2 (Week 2): Pt will thread BLE into pants with MIN A OT Short Term Goal 2 - Progress (Week 2): Progressing toward goal OT Short Term Goal 3 (Week 2): Pt will sit to stand with MOD A in prep for CM consistently OT Short Term Goal 3 - Progress (Week 2): Met OT Short Term Goal 4 (Week 2): Pt will maintain dynamic sitting balance with MIN A for 5 min during functional task Week 3:  OT Short Term Goal 1 (Week 3): Pt will don shirt with min A OT Short Term Goal 2 (Week 3): Pt will  consistently complete toilet transfers with mod A OT Short Term Goal 3 (Week 3): Pt will don LB clothing with mod A overall  Skilled Therapeutic Interventions/Progress Updates:    Pt recevide in w/c with no c/o pain, requesting to use the bathroom. Pt wheeled into bathroom after socks and shoes donned max A for time management. Pt completed stand pivot transfer to the [facility] over toilet with use of grab bar with min A. Mod A for clothing management. Pt completed stand pivot transfer back to w/c with min A. Pt completed UB bathing at the sink with min A. Pt was taken to the therapy gym via w/c for time management. She stood and required max facilitation initially for placement of RLE, blocking at the knee and facilitation of weightbearing through extended RUE on a table. Pt completed functional reaching across midline with the LUE to facilitate weightshift to the R. This session pt was able to demonstrate active elbow extension, finger extension and ~10% shoulder flexion/abduction. Pt often having difficulty breaking flexor tone synergy in the RUE. Pt followed demonstration re self PROM to break flexor tone and focus on extensor muscle lengthening. Pt completed another trial of standing with R weightshift. Pt returned to her room and had increased difficulty with R ankle inversion during stand pivot transfer back to bed, mod A required and 3 attempts. Pt discouraged but encouragement provided re great progress of R UE muscle activation this session. Pt was left supine with all needs met, bed alarm set.  Therapy Documentation Precautions:  Precautions Precautions: Fall Precaution Comments: Increased tone RUE (flexion)/RLE(extension) Required Braces or Orthoses: Cervical Brace Cervical Brace: Soft collar, For comfort Restrictions Weight Bearing Restrictions: No  Therapy/Group: Individual Therapy  Curtis Sites 02/12/2020, 6:37 AM

## 2020-02-12 NOTE — Progress Notes (Signed)
Speech Language Pathology Daily Session Note  Patient Details  Name: Alison Cuevas MRN: [id number] Date of Birth: Nov 20, 1945  Today's Date: 02/12/2020 SLP Individual Time: 0900-0959 SLP Individual Time Calculation (min): 59 min  Short Term Goals: Week 2: SLP Short Term Goal 1 (Week 2): Pt will consume trials of regular textures demonstrating efficient mastication and oral clearance across 2 sessions with no more than min A cues for oral clearance of residue. SLP Short Term Goal 2 (Week 2): Pt will sustain attention to functional tasks for 15 mins with mod A verbal cues for redirection. SLP Short Term Goal 3 (Week 2): Patient will utilize external aids to recall daily information with Mod A verbal and visual cues. SLP Short Term Goal 4 (Week 2): Patient will demonstrate functional problem solving for basic and familiar tasks with Mod A verbal cues. SLP Short Term Goal 5 (Week 2): Patient will self-monitor and correct errors during functional tasks with Mod A verbal and visual cues.  Skilled Therapeutic Interventions: Pt was seen for skilled ST targeting cognitive goals. SLP facilitated session with Min A verbal and visual cues for problem solving, and error awareness during a semi-complex monthly calendar/scheduling task. Min-Mod A verbal cues were required for organization within task - cues for organization needed to increase as task progressed and more information was charted on calendar. Pt was slightly impulsive during task, requiring verbal cues to slow down to minimize errors and stay organized/track along a list and within the calendar. Pt also expressed good insight into need for supervision at discharge and plan TBD home vs SNF, however Min A verbal cues required for use of aid to recall of anticipated d/c date. Pt left sitting in wheelchair with alarm set and needs within reach. Continue per current plan of care.          Pain Pain Assessment Pain Scale: 0-10 Pain Score: 0-No  pain   Therapy/Group: Individual Therapy  Arbutus Leas 02/12/2020, 10:07 AM

## 2020-02-12 NOTE — Progress Notes (Signed)
Physical Therapy Session Note  Patient Details  Name: Alison Cuevas MRN: [id number] Date of Birth: 23-Jul-1945  Today's Date: 02/12/2020 PT Individual Time: 1300-1430 PT Individual Time Calculation (min): 90 min   Short Term Goals: Week 2:  PT Short Term Goal 1 (Week 2): Patient will perform bed mobility with CGA. PT Short Term Goal 2 (Week 2): Patient will perform basic transfers with min A consitently. PT Short Term Goal 3 (Week 2): Patient will ambulate 50 feet using LRAD with mod A. PT Short Term Goal 4 (Week 2): Patient will perform standing balance >5 min with CGA.  Skilled Therapeutic Interventions/Progress Updates:     Patient in w/c preparing to go to the bathroom with RN upon PT arrival. Patient alert and agreeable to PT session. Patient denied pain during session, however, reports that she did not sleep well last night due to change in medicaiton and R LE "spasms." Removed kinesiotape form R LE during session, no signs of skin irritation or breakdown. Will trial leaving kinesiotape off tonight to see if R LE spasms improve.  Therapeutic Activity: Bed Mobility: Patient performed sit to supine with min A for R LE managment. Provided verbal cues for bringing knees to chest to bring her R LE onto the bed. Transfers: Patient performed toilet transfer in the [facility] with CGA for standing. Patient was continent of bowl and bladder during toileting, required total A for peri-care and LB clothing management. She performed stand pivot w/c>mat table and w/c>bed with min A to stand and mod A to pivot due to R LE extensor tone. PT blocked R foot and knee throughout. She performed sit to/from stand x6 with min A-CGA +2 with HHA. Provided verbal cues and facilitation for R foot placement and forward weight shift to stand.  Gait Training:  Patient ambulated 16 feet with mod-max +2, PT providing facilitation at hips and R foot for placement and a second person providing L HHA and w/c follow for safety.  Ambulated with step-through gait pattern with decreased step length on L and decreased stance time on R, blocked R knee from behind to prevent extensor thrust in stance, and facilitated R foot flat  In stance due to inversion/PF tone. Provided verbal cues for sequencing and timing.  Wheelchair Mobility:  Patient was transported in the w/c with total A throughout session for energy conservation and time management.  Neuromuscular Re-ed: Patient performed the following R LE NMR activities: -seated hamstring curl 2x5 with minimal resistance on elevated mat table with standard sock donned, progressed from PROM to [facility] with 25% assist, unable to complete full range and difficulty with hamstring activation, set off extensor tone intermittently -R step-taps on 6" step x1 with total a and on 3" step 3x5 progressing from max A for R LE placement to no assist for placement, except facilitation of foot placement in neutral to avoid inversion, focused on increased hip and knee flexion to oppose extensor tone with a functional task  Placed adjustable night splint on patient's R LE. Placed with knee and hip flexed, patient's foot in neutral DF with heel down completely in the splint. Placed a folded pillow under her knee to maintain knee flexion and minimize activation extensor tone. Educated patient on placement and set up. Will check on patient later this afternoon to assess if she is maintaining this position.   Patient in bed at end of session with breaks locked, bed alarm set, and all needs within reach.    Therapy Documentation Precautions:  Precautions Precautions: Fall Precaution Comments: Increased tone RUE (flexion)/RLE(extension) Required Braces or Orthoses: Cervical Brace Cervical Brace: Soft collar, For comfort Restrictions Weight Bearing Restrictions: No   Therapy/Group: Individual Therapy  Shawn Dannenberg L Rosette Bellavance PT, DPT  02/12/2020, 4:11 PM

## 2020-02-13 ENCOUNTER — Inpatient Hospital Stay (HOSPITAL_COMMUNITY): Payer: Medicare Other

## 2020-02-13 ENCOUNTER — Inpatient Hospital Stay (HOSPITAL_COMMUNITY): Payer: Medicare Other | Admitting: Speech Pathology

## 2020-02-13 NOTE — Progress Notes (Signed)
Speech Language Pathology Weekly Progress and Session Note  Patient Details  Name: Alison Cuevas MRN: [id number] Date of Birth: Mar 18, 1946  Beginning of progress report period: February 06, 2020 End of progress report period: February 13, 2020  Today's Date: 02/13/2020 SLP Individual Time: 1400-1500 SLP Individual Time Calculation (min): 60 min  Short Term Goals: Week 2: SLP Short Term Goal 1 (Week 2): Pt will consume trials of regular textures demonstrating efficient mastication and oral clearance across 2 sessions with no more than min A cues for oral clearance of residue. SLP Short Term Goal 1 - Progress (Week 2): Not met SLP Short Term Goal 2 (Week 2): Pt will sustain attention to functional tasks for 15 mins with mod A verbal cues for redirection. SLP Short Term Goal 2 - Progress (Week 2): Partly met SLP Short Term Goal 3 (Week 2): Patient will utilize external aids to recall daily information with Mod A verbal and visual cues. SLP Short Term Goal 3 - Progress (Week 2): Partly met SLP Short Term Goal 4 (Week 2): Patient will demonstrate functional problem solving for basic and familiar tasks with Mod A verbal cues. SLP Short Term Goal 4 - Progress (Week 2): Partly met SLP Short Term Goal 5 (Week 2): Patient will self-monitor and correct errors during functional tasks with Mod A verbal and visual cues. SLP Short Term Goal 5 - Progress (Week 2): Not met    New Short Term Goals: Week 3: SLP Short Term Goal 1 (Week 3): Pt will consume trials of regular textures demonstrating efficient mastication and oral clearance across 2 sessions with no more than min A cues for oral clearance of residue. SLP Short Term Goal 2 (Week 3): Pt will sustain attention to functional tasks for 15 mins with min A verbal cues for redirection. SLP Short Term Goal 3 (Week 3): Patient will utilize external aids to recall daily information with Min A verbal and visual cues. SLP Short Term Goal 4 (Week 3): Patient will  demonstrate functional problem solving for basic and familiar tasks with Mod A verbal cues. SLP Short Term Goal 5 (Week 3): Patient will self-monitor and correct errors during functional tasks with Mod A verbal and visual cues.  Weekly Progress Updates: Pt is making good progress with goals and has met 1 and partially met 2 short term goals this reporting period. Due to improved less cueing required for basic problem solving, use of memory book, and sustained attention tasks. Currently pt requires moderate assistance for semi complex problem solving tasks, min assistance for use of memory book, and min assistance with verbal redirection and ego support for sustained attention and problem solving tasks. Pt would benefit from continued skilled SLP intervention to maximize cognitive skills necessary to maximize functional independence prior to discharge.      Intensity: Minumum of 1-2 x/day, 30 to 90 minutes Frequency: 3 to 5 out of 7 days Duration/Length of Stay: 02/27/20 Treatment/Interventions: Cognitive remediation/compensation;Dysphagia/aspiration precaution training;Functional tasks;Patient/family education;Internal/external aids;Environmental Environmental consultant;Therapeutic Activities   Daily Session  Skilled Therapeutic Interventions: Pt is making good progress with goals and has met 1 and partially met 2 short term goals this reporting period. Due to less cueing required for basic problem solving, use of memory book, and sustained attention tasks. Currently pt requires moderate assistance for semi complex problem solving tasks, min assistance for use of memory book, and min assistance with verbal redirection and ego support for sustained attention and problem solving tasks. Pt would benefit from continued skilled  SLP intervention to maximize cognitive skills necessary to maximize functional independence prior to discharge.       General    Pain Pain Assessment Pain Scale: Faces Faces  Pain Scale: No hurt  Therapy/Group: Individual Therapy  Darrol Poke Aboubacar Matsuo 02/13/2020, 4:36 PM

## 2020-02-13 NOTE — Progress Notes (Signed)
Occupational Therapy Session Note  Patient Details  Name: Allene Furuya MRN: [id number] Date of Birth: [birth year]  Today's Date: 02/13/2020 OT Individual Time: 4982-6415 OT Individual Time Calculation (min): 60 min    Short Term Goals: Week 1:  OT Short Term Goal 1 (Week 1): Pt will recall hemi dressing techniques with min VC OT Short Term Goal 1 - Progress (Week 1): Progressing toward goal OT Short Term Goal 2 (Week 1): Pt will visually attend to R arm during bathing tasks to demo  improve R inattention OT Short Term Goal 2 - Progress (Week 1): Met OT Short Term Goal 3 (Week 1): Pt will transfer to [facility] with MAX A of 1 caregiver to decrease BOC for toileting OT Short Term Goal 3 - Progress (Week 1): Met OT Short Term Goal 4 (Week 1): Pt will locate 2/2 grooming items on R of sink for improved R inattention OT Short Term Goal 4 - Progress (Week 1): Met  Skilled Therapeutic Interventions/Progress Updates:    1:1. Pt received in w/c agreeable to OT but worried, "my hand isnt working like it did yesterday." Pt reassured that OT would incorporate hand function into tx and also that there would be good and bad days. Total A to wash hair with hair washing tray. Pt bathes UB with HOH A for NMR of RUE to wash LUE and incorporate in ringing out washcloths reaching for soap and pouring into basin. Pt dons shirt with MIN A for threading RUE and VC for hemi dressing. Pt washes LB in standing with MIN A for R knee block and VC for weight shifitng L while washing peri are and MIN A positioning RLE to wash R foot. Pt able to thread BLE into pants, but requries total A for footwear d/t time management. eixted session with pt seated in w/c, exit alarm on and call lightin reach  Therapy Documentation Precautions:  Precautions Precautions: Fall Precaution Comments: Increased tone RUE (flexion)/RLE(extension) Required Braces or Orthoses: Cervical Brace Cervical Brace: Soft collar, For  comfort Restrictions Weight Bearing Restrictions: No General:   Vital Signs:   Pain:   ADL: ADL Upper Body Bathing: Maximal cueing, Moderate assistance Where Assessed-Upper Body Bathing: Wheelchair, Sitting at sink Lower Body Bathing: Maximal assistance, Maximal cueing Where Assessed-Lower Body Bathing: Bed level Upper Body Dressing: Maximal assistance, Maximal cueing Where Assessed-Upper Body Dressing: Sitting at sink, Wheelchair Lower Body Dressing: Dependent Where Assessed-Lower Body Dressing: Bed level (able to bridge hips) Toilet Transfer: Dependent (stedy) Vision   Perception    Praxis   Exercises:   Other Treatments:     Therapy/Group: Individual Therapy  Tonny Branch 02/13/2020, 10:45 AM

## 2020-02-13 NOTE — Progress Notes (Signed)
Physical Therapy Weekly Progress Note  Patient Details  Name: Alison Cuevas MRN: [id number] Date of Birth: Sep 21, 1945  Beginning of progress report period: February 06, 2020 End of progress report period: February 13, 2020  Today's Date: 02/13/2020 PT Individual Time: 3354-5625 PT Individual Time Calculation (min): 55 min   Patient has met 0 of 4 short term goals.  Patient with slow progress this week. Continues to have limitations in mobility due to R UE flexor tone and LE extensor tone. Patient currently requires min A for bed mobility, min-mod A for transfers and gait using L rail, and mod A w/c mobility 150 feet.   Patient continues to demonstrate the following deficits muscle weakness and muscle joint tightness, decreased cardiorespiratoy endurance, impaired timing and sequencing, abnormal tone, unbalanced muscle activation, decreased coordination and decreased motor planning, decreased problem solving and decreased memory and decreased sitting balance, decreased standing balance, decreased postural control, hemiplegia and decreased balance strategies and therefore will continue to benefit from skilled PT intervention to increase functional independence with mobility.  Patient progressing toward long term goals.  Continue plan of care.  PT Short Term Goals Week 2:  PT Short Term Goal 1 (Week 2): Patient will perform bed mobility with CGA. PT Short Term Goal 1 - Progress (Week 2): Progressing toward goal PT Short Term Goal 2 (Week 2): Patient will perform basic transfers with min A consitently. PT Short Term Goal 2 - Progress (Week 2): Progressing toward goal PT Short Term Goal 3 (Week 2): Patient will ambulate 50 feet using LRAD with mod A. PT Short Term Goal 3 - Progress (Week 2): Progressing toward goal PT Short Term Goal 4 (Week 2): Patient will perform standing balance >5 min with CGA. PT Short Term Goal 4 - Progress (Week 2): Progressing toward goal Week 3:  PT Short Term Goal 1 (Week  3): Patient will perform bed mobility with CGA. PT Short Term Goal 2 (Week 3): Patient will propel a manual w/c >25 ft with min A. PT Short Term Goal 3 (Week 3): Patient will ambulate 50 feet using LRAD with mod A. PT Short Term Goal 4 (Week 3): Patient will perform standing balance >5 min with CGA.  Skilled Therapeutic Interventions/Progress Updates:     Patient in bed upon PT arrival. Patient alert and agreeable to PT session. Patient denied pain during session. Reported discomfort/numbness in R UE due to poor positioning of R WHO overnight.   Educated patient on doffing splint with L hand or assisting to reposition hand splint. Patient able to perform this independently after 2 trials. Also, educated on positioning in L side-lying for improved positioning to reduce R UE flexor and L UE extensor tone, positioning handout placed above patient's bed. Also, discussed R LE adjustable resting night splint. Patient reported that during the night her foot became pointed and "twisted," indicating inversion, in the splint. Patient has diminished sensation and is only aware that her foot is poorly positioned if she is able to see it. Will discuss leaving this splint off with MD to reduce risk of injury or skin breakdown overnight.    Reapplied kinesiotape for inhibition of gastrocnemius and soleus. Applied tape from distal to proximal without stretch for inhibition. Held tape for anterior tibialis facilitation today due to poor tolerance of spontaneous muscle activation by patient, however, continues with tactile stimulation with tape off. Educated patient on bringing R LE into figure four position to perform tactile stimulation to accommodate to spontaneous muscle activation and improve  attention to R LE. Able to perform sitting up in the bed independently x2.  Therapeutic Activity: Bed Mobility: Patient performed rolling R/L and supine to sit with min A for trunk and LE managment. Provided verbal cues for  bending opposite LE to push for rolling and management of R UE with mobility. Donned patient's [employer] tennis shoes with total A for energy conservation and time management with patient sitting EOB with supervision for sitting balance.  Transfers: Patient performed squat pivot bed>w/c and w/c>BSC over the toilet with min A and R foot blocked to prevent extensor tone. She performed sit to/from stand x1 using the grab bar for total A LB clothing management in preparation with toileting with min A. Provided verbal cues for hand placement, head-hips relationship encouraging hip/knee flexion throughout squat pivot to prevent extensor tone, and forward weight shift for sit to stand.  Neuromuscular Re-ed: Patient performed the following R UE/LE motor control and tone management activities: -R DF stretch with knee bent 2x30 sec, achieved ~5 deg past neutral -R DF stretch with knee extended 2x30 sec, achieved neutral position, noted increased tone today after no kinesiotape last night compared to yesterday  -Prolonged stretch into R shoulder abduction/ER, elbow/wrist/hand extension with supination x3 min Encouraged diaphragmatic breathing and relaxation throughout stretching  Patient on the [facility] handed off to [place], [place] at end of session.    Therapy Documentation Precautions:  Precautions Precautions: Fall Precaution Comments: Increased tone RUE (flexion)/RLE(extension) Required Braces or Orthoses: Cervical Brace Cervical Brace: Soft collar, For comfort Restrictions Weight Bearing Restrictions: No  Therapy/Group: Individual Therapy   L  PT, DPT  02/13/2020, 12:04 PM

## 2020-02-13 NOTE — Progress Notes (Signed)
Patient ID: Alison Cuevas, female   DOB: 22-Jun-1946, 74 y.o.   MRN: [id number]   SW followed up with [name] [phone] to inform on this SW not being in the office tomorrow, and inquiring if they have discussed home vs SNF. Reports her brother intends to speak with pt this evening about best plan of care. They would still like to explore all options. SW informed there is a packet of private aide and SNF locations that will be available for pick up for SW department tomorrow.   Loralee Pacas, MSW, [place] Office: [phone] Cell: [phone] Fax: [phone]

## 2020-02-13 NOTE — Progress Notes (Signed)
Occupational Therapy Session Note  Patient Details  Name: Alison Cuevas MRN: [id number] Date of Birth: [birth year]  Today's Date: 02/13/2020 OT Individual Time: 1530-1600 OT Individual Time Calculation (min): 30 min    Short Term Goals: Week 1:  OT Short Term Goal 1 (Week 1): Pt will recall hemi dressing techniques with min VC OT Short Term Goal 1 - Progress (Week 1): Progressing toward goal OT Short Term Goal 2 (Week 1): Pt will visually attend to R arm during bathing tasks to demo  improve R inattention OT Short Term Goal 2 - Progress (Week 1): Met OT Short Term Goal 3 (Week 1): Pt will transfer to [facility] with MAX A of 1 caregiver to decrease BOC for toileting OT Short Term Goal 3 - Progress (Week 1): Met OT Short Term Goal 4 (Week 1): Pt will locate 2/2 grooming items on R of sink for improved R inattention OT Short Term Goal 4 - Progress (Week 1): Met  Skilled Therapeutic Interventions/Progress Updates:    1:1. Pt received in bed agreeable to OT. OT reapplied inhibition taping to R plantarflexors for overnight in sidelying with knee flexed. Ot completes PROM of RUE in all planes under 90* shoulder flexion/abduction with emphasis on positions opposite of flexor synergy. In supine, pt able to complete active elbow extension with OT performing elbow flexion to not encourage flexor synergy pattern. Exited session with pt seated in bed, exit alarm on and call light in reach  Therapy Documentation Precautions:  Precautions Precautions: Fall Precaution Comments: Increased tone RUE (flexion)/RLE(extension) Required Braces or Orthoses: Cervical Brace Cervical Brace: Soft collar, For comfort Restrictions Weight Bearing Restrictions: No General:   Vital Signs: Therapy Vitals Temp: 97.7 F (36.5 C) Temp Source: Oral Pulse Rate: 83 Resp: 16 BP: (!) 102/54 Patient Position (if appropriate): Lying Oxygen Therapy SpO2: 97 % O2 Device: Room Air Pain: Pain Assessment Pain Scale:  Faces Faces Pain Scale: No hurt ADL: ADL Upper Body Bathing: Maximal cueing, Moderate assistance Where Assessed-Upper Body Bathing: Wheelchair, Sitting at sink Lower Body Bathing: Maximal assistance, Maximal cueing Where Assessed-Lower Body Bathing: Bed level Upper Body Dressing: Maximal assistance, Maximal cueing Where Assessed-Upper Body Dressing: Sitting at sink, Wheelchair Lower Body Dressing: Dependent Where Assessed-Lower Body Dressing: Bed level (able to bridge hips) Toilet Transfer: Dependent (stedy) Vision   Perception    Praxis   Exercises:   Other Treatments:     Therapy/Group: Individual Therapy  Tonny Branch 02/13/2020, 4:21 PM

## 2020-02-13 NOTE — NC FL2 (Addendum)
[place] LEVEL OF CARE SCREENING TOOL     IDENTIFICATION  Patient Name: Alison Cuevas Birthdate: 29-May-1946 Sex: female Admission Date (Current Location): 01/29/2020  [facility] and [place] Number:  Herbalist and Address:         Provider Number: [id number]  Attending Physician Name and Address:  Meredith Staggers, MD  Relative Name and Phone Number:  Ed Blalock (Daughter): [phone]    Current Level of Care: Hospital Recommended Level of Care: [place] Prior Approval Number:    Date Approved/Denied:   PASRR Number:    Discharge Plan: SNF    Current Diagnoses: Patient Active Problem List   Diagnosis Date Noted   Intraparenchymal hemorrhage of brain [facility])    Delirium    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, hypertensive 01/26/2020   Conductive hearing loss of right ear with unrestricted hearing of left ear 05/28/2019   Hyponatremia 08/31/2017   Generalized weakness 08/31/2017   Dehydration 08/31/2017   UTI (urinary tract infection) 08/31/2017   Tympanic membrane rupture, right 12/01/2016   Right ovarian cyst 08/22/2016   Fibromyalgia 06/03/2015   Cystocele 03/18/2014   Tick-borne disease 12/25/2013   Diverticulosis of colon without hemorrhage 06/12/2013   Hypertension    Osteopenia    Chronic low back pain 05/12/2011   HYPERCHOLESTEROLEMIA 03/17/2008   Anxiety state 09/17/2007   GERD 09/17/2007   DJD (degenerative joint disease) 09/17/2007    Orientation RESPIRATION BLADDER Height & Weight     Self, Time, Situation, Place  Normal Continent Weight: 122 lb 12.7 oz (55.7 kg) Height:  5\' 2"  (157.5 cm)  BEHAVIORAL SYMPTOMS/MOOD NEUROLOGICAL BOWEL NUTRITION STATUS      Continent Diet (D3 Thin; medications whole with applesauce)  AMBULATORY STATUS COMMUNICATION OF NEEDS Skin   Limited Assist Verbally Normal                       Personal Care Assistance Level of  Assistance  Bathing, Dressing Bathing Assistance: Limited assistance   Dressing Assistance: Limited assistance     Functional Limitations Info             [place]  PT (By licensed PT), OT (By licensed OT), Speech therapy     PT Frequency: 5xs per week OT Frequency: 5xs per week     Speech Therapy Frequency: 5xs per week      Contractures Contractures Info: Not present    Additional Factors Info  Code Status Code Status Info: Full             Current Medications (02/13/2020):  This is the current hospital active medication list Current Facility-Administered Medications  Medication Dose Route Frequency Provider Last Rate Last Admin   acetaminophen (TYLENOL) tablet 325-650 mg  325-650 mg Oral Q4H PRN Bary Leriche, PA-C   325 mg at 02/13/20 0534   acetaminophen (TYLENOL) tablet 500 mg  500 mg Oral TID Meredith Staggers, MD   500 mg at 02/13/20 1232   alum & mag hydroxide-simeth (MAALOX/MYLANTA) 200-200-20 MG/5ML suspension 30 mL  30 mL Oral Q4H PRN Love, Pamela S, PA-C       bisacodyl (DULCOLAX) suppository 10 mg  10 mg Rectal Daily PRN Bary Leriche, PA-C   10 mg at 02/01/20 0808   chlorhexidine (PERIDEX) 0.12 % solution 15 mL  15 mL Mouth Rinse BID Bary Leriche, PA-C   15 mL at 02/13/20 0934   ciprofloxacin-dexamethasone (CIPRODEX)  0.3-0.1 % OTIC (EAR) suspension 4 drop  4 drop Right EAR BID Meredith Staggers, MD   4 drop at 02/13/20 0938   diphenhydrAMINE (BENADRYL) 12.5 MG/5ML elixir 12.5-25 mg  12.5-25 mg Oral Q6H PRN Bary Leriche, PA-C   25 mg at 02/02/20 2217   fluticasone (FLONASE) 50 MCG/ACT nasal spray 2 spray  2 spray Each Nare QHS Bary Leriche, PA-C   2 spray at 02/12/20 2147   guaiFENesin-dextromethorphan (ROBITUSSIN DM) 100-10 MG/5ML syrup 5-10 mL  5-10 mL Oral Q6H PRN Reesa Chew S, PA-C       hydrALAZINE (APRESOLINE) tablet 10 mg  10 mg Oral Q6H PRN Bary Leriche, PA-C   10 mg at 01/31/20 0998    HYDROcodone-acetaminophen (NORCO/VICODIN) 5-325 MG per tablet 0.5 tablet  0.5 tablet Oral QHS Raulkar, Clide Deutscher, MD   0.5 tablet at 02/12/20 2143   lidocaine (LIDODERM) 5 % 1 patch  1 patch Transdermal Daily PRN Raulkar, Clide Deutscher, MD       lisinopril (ZESTRIL) tablet 10 mg  10 mg Oral Daily Reesa Chew S, PA-C   10 mg at 02/13/20 0934   loratadine (CLARITIN) tablet 10 mg  10 mg Oral Len Childs, PA-C   10 mg at 02/12/20 3382   meclizine (ANTIVERT) tablet 12.5 mg  12.5 mg Oral TID Izora Ribas, MD   12.5 mg at 02/13/20 1231   pantoprazole (PROTONIX) EC tablet 40 mg  40 mg Oral QHS Love, Pamela S, PA-C   40 mg at 02/12/20 2144   polyethylene glycol (MIRALAX / GLYCOLAX) packet 17 g  17 g Oral Daily PRN Bary Leriche, PA-C   17 g at 02/01/20 0520   potassium chloride SA (KLOR-CON) CR tablet 20 mEq  20 mEq Oral Daily Meredith Staggers, MD   20 mEq at 02/13/20 0933   prochlorperazine (COMPAZINE) tablet 5-10 mg  5-10 mg Oral Q6H PRN Bary Leriche, PA-C   10 mg at 02/08/20 2006   Or   prochlorperazine (COMPAZINE) injection 5-10 mg  5-10 mg Intramuscular Q6H PRN Love, Pamela S, PA-C       Or   prochlorperazine (COMPAZINE) suppository 12.5 mg  12.5 mg Rectal Q6H PRN Love, Pamela S, PA-C       QUEtiapine (SEROQUEL) tablet 12.5 mg  12.5 mg Oral QHS Alger Simons T, MD   12.5 mg at 02/12/20 2144   senna-docusate (Senokot-S) tablet 2 tablet  2 tablet Oral BID Charlett Blake, MD   2 tablet at 02/13/20 0934   sodium phosphate (FLEET) 7-19 GM/118ML enema 1 enema  1 enema Rectal Once PRN Love, Pamela S, PA-C       tiZANidine (ZANAFLEX) tablet 2 mg  2 mg Oral QHS Alger Simons T, MD   2 mg at 02/11/20 2018   traZODone (DESYREL) tablet 25-50 mg  25-50 mg Oral QHS PRN Bary Leriche, PA-C   50 mg at 02/12/20 2142   verapamil (CALAN-SR) CR tablet 180 mg  180 mg Oral Daily Bary Leriche, PA-C   180 mg at 02/13/20 5053     Discharge Medications: Please see discharge  summary for a list of discharge medications.  Relevant Imaging Results:  Relevant Lab Results:   Additional Information [id number]  Rana Snare, LCSW

## 2020-02-13 NOTE — Progress Notes (Signed)
[place] PHYSICAL MEDICINE & REHABILITATION PROGRESS NOTE   Subjective/Complaints: Still focused on right ear. Refused tizanidine as she felt it makes her groggy. Didn't c/o grogginess per yesterday's note. Prefers just not to take anything for spasms  ROS: Patient denies fever, rash, sore throat, blurred vision, nausea, vomiting, diarrhea, cough, shortness of breath or chest pain, joint or back pain, headache, or mood change.    Objective:   No results found. No results for input(s): WBC, HGB, HCT, PLT in the last 72 hours. No results for input(s): NA, K, CL, CO2, GLUCOSE, BUN, CREATININE, CALCIUM in the last 72 hours.  Intake/Output Summary (Last 24 hours) at 02/13/2020 1828 Last data filed at 02/13/2020 1230 Gross per 24 hour  Intake 508 ml  Output --  Net 508 ml     Physical Exam: Vital Signs Blood pressure (!) 102/54, pulse 83, temperature 97.7 F (36.5 C), temperature source Oral, resp. rate 16, height 5\' 2"  (1.575 m), weight 55.7 kg, last menstrual period 06/27/1972, SpO2 97 %. Constitutional: No distress . Vital signs reviewed. HEENT: EOMI, oral membranes moist. Scarring right ear Neck: supple Cardiovascular: RRR without murmur. No JVD    Respiratory/Chest: CTA Bilaterally without wheezes or rales. Normal effort    GI/Abdomen: BS +, non-tender, non-distended Ext: no clubbing, cyanosis, or edema Psych: pleasant and cooperative Skin: Clean and intact without signs of breakdown Neuro: STM deficits right central 7. Right inattention. RUE and RLE 0 to tr/5. LUE/LLE 4-4+/5 prox to distal. Senses pain in right arm and leg. Flexor tone RUE 1/4. Extensor RLE with heel cord contracture ongoing still present--really no changes MusculoskeletalRight heel cord contracture still present-10-20 degrees , mild low back tenderness still present  Assessment/Plan: 1. 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  Physiatrist is providing close team supervision and 24 hour management of active medical problems listed below.  Physiatrist and rehab team continue to assess barriers to discharge/monitor patient progress toward functional and medical goals  Care Tool:  Bathing    Body parts bathed by patient: Chest, Abdomen, Front perineal area, Face, Right arm, Right upper leg, Left upper leg   Body parts bathed by helper: Left arm, Buttocks, Right lower leg, Left lower leg Body parts n/a: Right upper leg, Left upper leg, Right lower leg, Left lower leg   Bathing assist Assist Level: Moderate Assistance - Patient 50 - 74%     Upper Body Dressing/Undressing Upper body dressing   What is the patient wearing?: Pull over shirt    Upper body assist Assist Level: Moderate Assistance - Patient 50 - 74%    Lower Body Dressing/Undressing Lower body dressing      What is the patient wearing?: Pants     Lower body assist Assist for lower body dressing: Moderate Assistance - Patient 50 - 74%     Toileting Toileting    Toileting assist Assist for toileting: Maximal Assistance - Patient 25 - 49%     Transfers Chair/bed transfer  Transfers assist     Chair/bed transfer assist level: Moderate Assistance - Patient 50 - 74%     Locomotion Ambulation   Ambulation assist   Ambulation activity did not occur: Safety/medical concerns (decreased motor control, R hemi)  Assist level: 2 helpers (mod assist + w/c follow for safety) Assistive device:  (rail) Max distance: 12 ft   Walk 10 feet activity   Assist  Walk 10  feet activity did not occur: Safety/medical concerns  Assist level: 2 helpers Assistive device:  (rail)   Walk 50 feet activity   Assist Walk 50 feet with 2 turns activity did not occur: Safety/medical concerns         Walk 150 feet activity   Assist Walk 150 feet activity did not occur: Safety/medical concerns          Walk 10 feet on uneven surface  activity   Assist Walk 10 feet on uneven surfaces activity did not occur: Safety/medical concerns         Wheelchair     Assist Will patient use wheelchair at discharge?: Yes Type of Wheelchair: Manual Wheelchair activity did not occur: Safety/medical concerns (unable with and without skilled intervention due to poor motor planning and attention)  Wheelchair assist level: Moderate Assistance - Patient 50 - 74% Max wheelchair distance: 150 ft    Wheelchair 50 feet with 2 turns activity    Assist    Wheelchair 50 feet with 2 turns activity did not occur: Safety/medical concerns   Assist Level: Moderate Assistance - Patient 50 - 74%   Wheelchair 150 feet activity     Assist  Wheelchair 150 feet activity did not occur: Safety/medical concerns   Assist Level: Moderate Assistance - Patient 50 - 74%   Blood pressure (!) 102/54, pulse 83, temperature 97.7 F (36.5 C), temperature source Oral, resp. rate 16, height 5\' 2"  (1.575 m), weight 55.7 kg, last menstrual period 06/27/1972, SpO2 97 %.  Medical Problem List and Plan: 1. 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             -patient may shower             -ELOS/Goals: 28-32 days/Min A             -Continue CIR therapies with PT, OT, SLP  -adjustable tension night-splint/dorsi-flexion splint to address right heel cord contracture   -8/17 Tolerated 1mg  tizanidine--will try 2mg  qhs tonight to see if we can make further progress with her tone  8/19 will stop tizanidine per pt request  2.  Antithrombotics: -DVT/anticoagulation:  Mechanical: Sequential compression devices, below knee Bilateral lower extremities             -antiplatelet  therapy: None d/t hemorrhage 3. Chronic neck pain/Pain Management: Uses  Hydrocodone prn at home-->Continue to hold given AMS  -schedule tylenol 500mg  tid  -kpad  - now only taking 1/2 norco at HS.  4. Mood: LCSW to follow for evaluation and support.              -antipsychotic agents: N/A 5. Neuropsych: This patient is not capable of making decisions on her own behalf. 6. Skin/Wound Care: Routine pressure relief measures.  7. Fluids/Electrolytes/Nutrition:   -labs all reviewed today   -BUN/Cr 12, 4.1 8/16    -intake is improving. Ate 80-100% yesterday  8. HTN: Monitor BP tid             Verapamil and lisinopril.                Vitals:   02/13/20 0504 02/13/20 1325  BP: 135/65 (!) 102/54  Pulse: 97 83  Resp: 17 16  Temp: 97.7 F (36.5 C) 97.7 F (36.5  C)  SpO2: 92% 97%  8/19: well controlled 9. Kleb pneumonia UTI: ceftriaxone x3 days and now on keflex to complete 7 day course. abx completed         -low grade temp--now afebrile  - f/u ucx negative 10. Delirum/AMS:  .   - resolving  -continue 12mg  seroquel at [facility]    11. Dysphagia:   -D3/thins, advance per SLP   13.  Small volume rectal bleeding likely traumatic due to hard stool  -increased stool softeners  -moving bowels 14.  Otitis externa with wax build up on right  S/p debrox drops---wax resolved  -8/16--resumed ciprodex ear gtts per recs of her home ENT  8/19: reviewed plan with patient       LOS: 15 days A FACE TO [place] 02/13/2020, 6:28 PM

## 2020-02-14 ENCOUNTER — Ambulatory Visit (HOSPITAL_COMMUNITY): Payer: Medicare Other

## 2020-02-14 ENCOUNTER — Encounter (HOSPITAL_COMMUNITY): Payer: Medicare Other

## 2020-02-14 ENCOUNTER — Inpatient Hospital Stay (HOSPITAL_COMMUNITY): Payer: Medicare Other

## 2020-02-14 ENCOUNTER — Encounter (HOSPITAL_COMMUNITY): Payer: Medicare Other | Admitting: Psychology

## 2020-02-14 DIAGNOSIS — I612 Nontraumatic intracerebral hemorrhage in hemisphere, unspecified: Secondary | ICD-10-CM

## 2020-02-14 NOTE — Progress Notes (Signed)
Patient ID: Alison Cuevas, female   DOB: [birth year], 74 y.o.   MRN: [id number]   Met with pt, daughter and two son's to discuss options and answer their questions. Gave daughter packet [name] had prepared and they will discuss plan, but leaning toward SNF due to coverage. Daughter-[name] wants [name] to call her Monday to discuss plan. She starts a new job Monday and will be working form 7:00-1:30 pm but can be contacted there. Asked them to come up with three choices if SNF is the decision they make, they will look at facilities and get back with [name]. Pt has been vaccinated. Pt is in agreement with the plan, which ever children decide

## 2020-02-14 NOTE — Progress Notes (Signed)
[place] PHYSICAL MEDICINE & REHABILITATION PROGRESS NOTE   Subjective/Complaints: Pt states that she had a pretty good night. Woke up at around 0300 to urinate. Feels that she might be getting another UTI.  Children at bedside. Encouraged by increased movement of RUE.  ROS: Patient denies fever, rash, sore throat, blurred vision, nausea, vomiting, diarrhea, cough, shortness of breath or chest pain, joint or back pain, headache, or mood change.   Objective:   No results found. No results for input(s): WBC, HGB, HCT, PLT in the last 72 hours. No results for input(s): NA, K, CL, CO2, GLUCOSE, BUN, CREATININE, CALCIUM in the last 72 hours.  Intake/Output Summary (Last 24 hours) at 02/14/2020 1037 Last data filed at 02/14/2020 0900 Gross per 24 hour  Intake 890 ml  Output --  Net 890 ml     Physical Exam: Vital Signs Blood pressure (!) 119/57, pulse 80, temperature 98.4 F (36.9 C), temperature source Oral, resp. rate 16, height 5\' 2"  (1.575 m), weight 57.7 kg, last menstrual period 06/27/1972, SpO2 96 %. Constitutional: No distress . Vital signs reviewed. HEENT: EOMI, oral membranes moist Neck: supple Cardiovascular: RRR without murmur. No JVD    Respiratory/Chest: CTA Bilaterally without wheezes or rales. Normal effort    GI/Abdomen: BS +, non-tender, non-distended Ext: no clubbing, cyanosis, or edema Psych: pleasant and cooperative Skin: Clean and intact without signs of breakdown Neuro: STM deficits right central 7. Right inattention. RUE 2/5 elbow and wrist, RLE 1+ HE, KE. LUE/LLE 4-4+/5 prox to distal. Senses pain in right arm and leg. Flexor tone RUE 1/4. Extensor RLE with heel cord contracture ongoing still present--able to range to -10-15.  MusculoskeletalRight heel cord contracture still present-10-15 degrees , mild low back tenderness still present  Assessment/Plan: 1. 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  Physiatrist is providing close team supervision and 24 hour management of active medical problems listed below.  Physiatrist and rehab team continue to assess barriers to discharge/monitor patient progress toward functional and medical goals  Care Tool:  Bathing    Body parts bathed by patient: Chest, Abdomen, Front perineal area, Face, Right arm, Right upper leg, Left upper leg   Body parts bathed by helper: Left arm, Buttocks, Right lower leg, Left lower leg Body parts n/a: Right upper leg, Left upper leg, Right lower leg, Left lower leg   Bathing assist Assist Level: Moderate Assistance - Patient 50 - 74%     Upper Body Dressing/Undressing Upper body dressing   What is the patient wearing?: Pull over shirt    Upper body assist Assist Level: Moderate Assistance - Patient 50 - 74%    Lower Body Dressing/Undressing Lower body dressing      What is the patient wearing?: Pants     Lower body assist Assist for lower body dressing: Moderate Assistance - Patient 50 - 74%     Toileting Toileting    Toileting assist Assist for toileting: Maximal Assistance - Patient 25 - 49%     Transfers Chair/bed transfer  Transfers assist     Chair/bed transfer assist level: Minimal Assistance - Patient > 75%     Locomotion Ambulation   Ambulation assist   Ambulation activity did not occur: Safety/medical concerns (decreased motor control, R hemi)  Assist level: 2 helpers (mod assist + w/c follow for safety) Assistive device:  (rail) Max distance: 12 ft   Walk 10 feet  activity   Assist  Walk 10 feet activity did not occur: Safety/medical concerns  Assist level: 2 helpers Assistive device:  (rail)   Walk 50 feet activity   Assist Walk 50 feet with 2 turns activity did not occur: Safety/medical concerns         Walk 150 feet activity   Assist Walk 150 feet activity did not occur:  Safety/medical concerns         Walk 10 feet on uneven surface  activity   Assist Walk 10 feet on uneven surfaces activity did not occur: Safety/medical concerns         Wheelchair     Assist Will patient use wheelchair at discharge?: Yes Type of Wheelchair: Manual Wheelchair activity did not occur: Safety/medical concerns (unable with and without skilled intervention due to poor motor planning and attention)  Wheelchair assist level: Moderate Assistance - Patient 50 - 74% Max wheelchair distance: 150 ft    Wheelchair 50 feet with 2 turns activity    Assist    Wheelchair 50 feet with 2 turns activity did not occur: Safety/medical concerns   Assist Level: Moderate Assistance - Patient 50 - 74%   Wheelchair 150 feet activity     Assist  Wheelchair 150 feet activity did not occur: Safety/medical concerns   Assist Level: Moderate Assistance - Patient 50 - 74%   Blood pressure (!) 119/57, pulse 80, temperature 98.4 F (36.9 C), temperature source Oral, resp. rate 16, height 5\' 2"  (1.575 m), weight 57.7 kg, last menstrual period 06/27/1972, SpO2 96 %.  Medical Problem List and Plan: 1. 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             -patient may shower             -ELOS/Goals: 28-32 days/Min A             -Continue CIR therapies with PT, OT, SLP  -adjustable tension night-splint/dorsi-flexion splint to address right heel cord contracture   -8/20 stopped tizanidine per pt request. 2.  Antithrombotics: -DVT/anticoagulation:  Mechanical: Sequential compression devices, below knee Bilateral lower extremities             -antiplatelet therapy: None d/t hemorrhage 3. Chronic neck pain/Pain Management: Uses  Hydrocodone  prn at home-->Continue to hold given AMS  -schedule tylenol 500mg  tid  -kpad  - now only taking 1/2 norco at HS.  4. Mood: LCSW to follow for evaluation and support.              -antipsychotic agents: N/A 5. Neuropsych: This patient is not capable of making decisions on her own behalf.  -appreciate neuropsych input 6. Skin/Wound Care: Routine pressure relief measures.  7. Fluids/Electrolytes/Nutrition:   -labs all reviewed today   -BUN/Cr 12, 4.1 8/16    -intake is improving. Ate 80-100% yesterday  8. HTN: Monitor BP tid             Verapamil and lisinopril.                Vitals:   02/13/20 2023 02/14/20 0301  BP: 127/70 (!) 119/57  Pulse: 82 80  Resp: 16 16  Temp: (!) 97.5 F (36.4 C) 98.4 F (36.9 C)  SpO2: 97% 96%  8/20: well controlled 9.  Kleb pneumonia UTI: ceftriaxone x3 days and now on keflex to complete 7 day course. abx completed         -low grade temp--now afebrile  - f/u ucx negative---will repeat ua,ucx again today given patient's complaints 10. Delirum/AMS:  .   - resolving  -continue 12mg  [facility]oquel at [facility]    11. Dysphagia:   -D3/thins, advance per SLP   13.  Small volume rectal bleeding likely traumatic due to hard stool  -increased stool softeners  -moving bowels 14.  Otitis externa with wax build up on right  S/p debrox drops---wax resolved  -8/16--resumed ciprodex ear gtts per recs of her home ENT  8/19: reviewed plan with patient       LOS: 16 days A FACE TO [place] 02/14/2020, 10:37 AM

## 2020-02-14 NOTE — Consult Note (Signed)
Neuropsychological Consultation   Patient:   Alison Cuevas   DOB:   10/04/1945  MR Number:  [id number]  Location:  [facility] [place] [id number] [place] [address] Dept: [place]: [phone]           Date of Service:   02/14/2020  Start Time:   9 AM End Time:   10 AM  Provider/Observer:  Ilean Skill, Psy.D.       Clinical Neuropsychologist       Billing Code/Service: 96158/96159  Chief Complaint:    Alison Cuevas is a 74 year old left-handed female with history of hypertension, anxiety disorder, degenerative disc disease with chronic pain. Patient with a past medical history including perforated right tympanic membrane patient was admitted to [facility] with sudden onset of right hemiparesis and numbness on 01/26/2020 that developed while she was working in her garden. CT of head showed IPH left frontoparietal convexity with subdural extension and 4 mm left to right midline shift. Patient was transferred to [facility] for management. CT head was negative for LVO or significant stenosis or AVM. Urine drug screen was negative for opiates but positive for benzodiazepines with the patient having a prescription for Xanax. Follow-up MRI showed stable large left IPH. The patient had times of lethargy and agitation which has been improving and right visual field deficits with right inattention, delay in processing, right hemiplegia.  Reason for Service:  Patient was referred for neuropsychological consultation due to coping and adjustment issues and increase in anxiety. Patient has a past history of anxiety with treatment utilizing benzodiazepines in the past. Patient's family wanted to be present and had questions to go over as a family, including patient.  Below see HPI for the current admission.  HPI: Alison Cuevas is a 74 year old [place] female with history of HTN, anxiety d/o, DDD  with chronic pain who was [facility] with sudden onset of right hemiparesis and numbness on 01/26/20 that developed while she was working in her garden.  History taken from daughter and chart review due to cognition. CT of head done showing IPH left frontoparietal convexity with subdural extension and 4 mm left-to-right midline shift.  She was transferred to [facility] for management and Cleviprex added for BP greater than 140.  CTA head was negative for LVO or significant stenosis or AVM.  CT venogram was negative for dural sinus thrombosis.  UDS negative for opiates but positive for benzodiazapines.  She also had issues with delirium even past admission requiring IV Haldol.    Dr. Leonie Man felt that bleed was hypertensive in nature and aspirin was discontinued.  Follow-up MRI brain personally reviewed, showing stable large left IPH.  Per report, unchanged 3.6 cm IPH in superior left hemisphere with moderate edema and small foci of acute ischemia within right MCA/PCA watershed zone.  Echocardiogram with EF of 65-70%. Normal LVH and no wall abnormality.  Patient has had issues with right extremity pain as well as spasms due to increase in tone.  Baclofen was added yesterday to help with spasticity.  Family also expresses some concerns of mild lethargy yesterday therefore urine culture was ordered and positive for Klebsiella.  Therefore started on ceftriaxone today.  She is noted to be more lethargic with family reporting agitation as well as confusion today.  Therapy evaluations revealed right visual field deficits with right inattention, delay in processing, right hemiplegia with tone as  well as pusher tendency affecting ADLs and mobility.  CIR was recommended due to functional decline. Please see preadmission assessment from earlier today as well.  Current Status:  Patient was alert and oriented today reporting that she has still had some anxiety about her recovery.  Today the patient, her 2  sons and daughter were all present for this visit.  The patient described how a few days prior she had had significant improvement in her right hand motor functioning but that it suddenly went back to significant motor impairments and has not come back again.  The patient was concerned about the sudden change in loss of functioning.  The patient had good expressive language and good receptive language and it was oriented with good mentation.  Mood is described to be generally positive but with some anxiety that is also chronic in nature.  Behavioral Observation: Alison Cuevas  presents as a 74 y.o.-year-old Left Caucasian Female who appeared her stated age. her dress was Appropriate and she was Well Groomed and her manners were Appropriate to the situation.  her participation was indicative of Appropriate and Redirectable behaviors.  There were any physical disabilities noted.  she displayed an appropriate level of cooperation and motivation.     Interactions:    Active Appropriate  Attention:   abnormal and attention span appeared shorter than expected for age  Memory:   within normal limits; recent and remote memory intact  Visuo-spatial:  On not formally evaluated today the patient has reports of visual inattention particularly for right visual field deficits.  Speech (Volume):  normal  Speech:   normal; normal  Thought Process:  Coherent and Relevant  Though Content:  WNL; not suicidal and not homicidal  Orientation:   person, place, time/date and situation  Judgment:   Fair  Planning:   Fair  Affect:    Anxious  Mood:    Anxious  Insight:   Fair  Intelligence:   normal  Medical History:   Past Medical History:  Diagnosis Date  . Allergic rhinitis    maple pollen  . Anxiety   . Arthritis   . Bowel obstruction ([place])   . Chronic low back pain    Dr. Trenton Gammon.  Has had back injection--bp elevated after.  . Chronic pain syndrome   . Chronic renal insufficiency, stage 2  (mild)    GFR 60-70  . DDD (degenerative disc disease), cervical    Hx of ACDF (Dr. [name]).  Followed by Dr. [name].  Also, Dr. [name] do left C7-T1 intralaminal epidural injection.  . Diverticulosis of colon   . DJD (degenerative joint disease)   . Gallstones   . GERD (gastroesophageal reflux disease)   . Herpes zoster 07/08/2014  . 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 Dense R arm and leg hemiplegia.  Marland Kitchen Hypercholesteremia    mild; pt declined statin trial 05/2014--needs recheck lipid panel at first f/u visit in 2017  . Hypertension   . Osteopenia 06/2017   T score -1.4 FRAX 15% / 2%  . Ovarian cyst 2017   82019-robotic assisted bilatel SPO: all path benign.  . Perianal dermatitis    prn cutivate  . Phlebitis   . 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 Right knee injury 2018   Patellofemoral crush injury--Dr. [name].  . Trochanteric bursitis of right hip    Recurrent (injection by  Dr. [name] 05/26/15)  . Tympanic membrane rupture, right 12/01/2016   As of 08/2018 pt set for tympanomastoidectomy and STSG (Dr. [name]). Recurrent fungal/bact OE.  . Varicose vein    left leg    Psychiatric History:  Patient has a prior psychiatric history including significant anxiety as well as chronic pain difficulties.  Family Med/Psych History:  Family History  Problem Relation Age of Onset  . Hypertension Mother   . Diverticulitis Mother   . Breast cancer Sister 73  . Melanoma Brother   . Prostate cancer Brother   . Leukemia Brother   . Lupus Sister   . Lung disease Brother   . Stomach cancer Father   . Other Other        Family member with MGUS   Impression/DX:  Avry Monteleone is a 74 year old left-handed female with history of hypertension, anxiety disorder, degenerative disc disease with chronic pain. Patient with a past medical  history including perforated right tympanic membrane patient was admitted to [facility] with sudden onset of right hemiparesis and numbness on 01/26/2020 that developed while she was working in her garden. CT of head showed IPH left frontoparietal convexity with subdural extension and 4 mm left to right midline shift. Patient was transferred to [facility] for management. CT head was negative for LVO or significant stenosis or AVM. Urine drug screen was negative for opiates but positive for benzodiazepines with the patient having a prescription for Xanax. Follow-up MRI showed stable large left IPH. The patient had times of lethargy and agitation which has been improving and right visual field deficits with right inattention, delay in processing, right hemiplegia.  Patient was alert and oriented today reporting that she has still had some anxiety about her recovery.  Today the patient, her 2 sons and daughter were all present for this visit.  The patient described how a few days prior she had had significant improvement in her right hand motor functioning but that it suddenly went back to significant motor impairments and has not come back again.  The patient was concerned about the sudden change in loss of functioning.  The patient had good expressive language and good receptive language and it was oriented with good mentation.  Mood is described to be generally positive but with some anxiety that is also chronic in nature.  Disposition/Plan:  Today I went over questions presented by family members and the patient herself regarding specific nature of her cerebrovascular accident and ICH and expectations going forward as far as recovery time and what would be needed for rehab post inpatient care.  The patient's mood was improved from previous status.  I will follow up with the patient next week.  Diagnosis:    ICH, Anxiety         Electronically Signed   _______________________ Ilean Skill, Psy.D.

## 2020-02-14 NOTE — Progress Notes (Signed)
Occupational Therapy Session Note  Patient Details  Name: Alison Cuevas MRN: [id number] Date of Birth: 12/01/1945  Today's Date: 02/14/2020 OT Individual Time: 1030-1100 OT Individual Time Calculation (min): 30 min    Short Term Goals: Week 1:  OT Short Term Goal 1 (Week 1): Pt will recall hemi dressing techniques with min VC OT Short Term Goal 1 - Progress (Week 1): Progressing toward goal OT Short Term Goal 2 (Week 1): Pt will visually attend to R arm during bathing tasks to demo  improve R inattention OT Short Term Goal 2 - Progress (Week 1): Met OT Short Term Goal 3 (Week 1): Pt will transfer to [facility] with MAX A of 1 caregiver to decrease BOC for toileting OT Short Term Goal 3 - Progress (Week 1): Met OT Short Term Goal 4 (Week 1): Pt will locate 2/2 grooming items on R of sink for improved R inattention OT Short Term Goal 4 - Progress (Week 1): Met  Skilled Therapeutic Interventions/Progress Updates:    1;1. Pt received in w/c with 3 family members present. Focus of session on educaiton in CVA recovery, NMR of RUE, education on tone/spasm management, medication affects, next LOC options and DME. Pt and OT able to demo volitional elbow, wrsit and digit movements for family while OT makes recommendations on DME and answered questions about anticipated level of function at discharge. All questions appropriate and answered to fullest extent. Exited session with pt seated in w/c, exit alarm on and call light tin reach  Therapy Documentation Precautions:  Precautions Precautions: Fall Precaution Comments: Increased tone RUE (flexion)/RLE(extension) Required Braces or Orthoses: Cervical Brace Cervical Brace: Soft collar, For comfort Restrictions Weight Bearing Restrictions: No General:   Vital Signs:  Pain: Pain Assessment Pain Scale: Faces Faces Pain Scale: No hurt ADL: ADL Upper Body Bathing: Maximal cueing, Moderate assistance Where Assessed-Upper Body Bathing: Wheelchair,  Sitting at sink Lower Body Bathing: Maximal assistance, Maximal cueing Where Assessed-Lower Body Bathing: Bed level Upper Body Dressing: Maximal assistance, Maximal cueing Where Assessed-Upper Body Dressing: Sitting at sink, Wheelchair Lower Body Dressing: Dependent Where Assessed-Lower Body Dressing: Bed level (able to bridge hips) Toilet Transfer: Dependent (stedy) Vision   Perception    Praxis   Exercises:   Other Treatments:     Therapy/Group: Individual Therapy  Tonny Branch 02/14/2020, 12:34 PM

## 2020-02-14 NOTE — Progress Notes (Signed)
Speech Language Pathology Daily Session Note  Patient Details  Name: Nezzie Manera MRN: [id number] Date of Birth: 12/02/1945  Today's Date: 02/14/2020 SLP Individual Time: 1000-1028 SLP Individual Time Calculation (min): 28 min  Short Term Goals: Week 3: SLP Short Term Goal 1 (Week 3): Pt will consume trials of regular textures demonstrating efficient mastication and oral clearance across 2 sessions with no more than min A cues for oral clearance of residue. SLP Short Term Goal 2 (Week 3): Pt will sustain attention to functional tasks for 15 mins with min A verbal cues for redirection. SLP Short Term Goal 3 (Week 3): Patient will utilize external aids to recall daily information with Min A verbal and visual cues. SLP Short Term Goal 4 (Week 3): Patient will demonstrate functional problem solving for basic and familiar tasks with Mod A verbal cues. SLP Short Term Goal 5 (Week 3): Patient will self-monitor and correct errors during functional tasks with Mod A verbal and visual cues.  Skilled Therapeutic Interventions: Pt was seen for skilled ST targeting education with pt and her 3 children. SLP reviewed goals and progress in ST related to cognition and swallowing since admission. Current diet recommendations reviewed (mech soft/thin), however also shared that ST anticipated pt to be appropriate for upgrade to regular textures prior to d/c and will follow up with family if there are any special considerations regarding diet textures prior to discharge. Verbal review and handout provided regarding compensatory memory, attention, and organizational strategies provided. Focused on routines, external aids such as calendars, note taking, reducing distractions and multitasking. Pt's family reports they feel that pt is anxious and perseverative on "worrying" at baseline, and is also very near cognitive baseline in relation to problem solving, attention, and memory as well. Explicitly recommended 73/4  supervision for greatest safety, as well as assistance with medications and finances. All questions answered to their satisfaction. Pt let in wheelchair with family and NT present. Continue per current plan of care.          Pain Pain Assessment Pain Scale: Faces Faces Pain Scale: No hurt  Therapy/Group: Individual Therapy  Arbutus Leas 02/14/2020, 12:16 PM

## 2020-02-14 NOTE — Progress Notes (Signed)
Physical Therapy Session Note  Patient Details  Name: Alison Cuevas MRN: [id number] Date of Birth: 1946-06-06  Today's Date: 02/14/2020 PT Individual Time: 1100-1158 PT Individual Time Calculation (min): 58 min   Short Term Goals: Week 2:  PT Short Term Goal 1 (Week 2): Patient will perform bed mobility with CGA. PT Short Term Goal 1 - Progress (Week 2): Progressing toward goal PT Short Term Goal 2 (Week 2): Patient will perform basic transfers with min A consitently. PT Short Term Goal 2 - Progress (Week 2): Progressing toward goal PT Short Term Goal 3 (Week 2): Patient will ambulate 50 feet using LRAD with mod A. PT Short Term Goal 3 - Progress (Week 2): Progressing toward goal PT Short Term Goal 4 (Week 2): Patient will perform standing balance >5 min with CGA. PT Short Term Goal 4 - Progress (Week 2): Progressing toward goal  Skilled Therapeutic Interventions/Progress Updates:    Pt received sitting w/c, family (x3 children) at bedside, OT handoff for patient care. Lengthy discussion held with patient and family regarding DC planning, home safety/measurements, and pt's current status of mobility. Family concerned of returning home due to pt's husband having chronic back pain who uses a RW for mobility and children have lifestyle's that won't provide opportunity for 24/7 care at DC. Family informed therapist that they are in the process of building a temporary ramp for home accessibility. Provided "Home Visit Checklist" for home measurements. Pt performed squat-pivot transfer towards L side from w/c to EOB, PT blocking R knee and supporting R ankle to prevent inversion during WB. Performed sit>supine with HOB flat and minA for LLE management and trunk support. Cues for sequencing and technique throughout. Supine>sit with minA for trunk elevation, able to manage BLE's off EOB without assist. Returned to w/c with squat pivot and minA with same technique outlined above. Pt transported to car  simulator in w/c with totalA for time management, family following. Pt performed car transfer to height similar of [employer] Fusion with min/modA and squat pivot, requires assist for bringing RLE into/out of bed and modA for squat pivot to transfer towards R side back to w/c. Pt reporting moderate fatigue, returned to room in w/c with totalA and squat pivot with minA back to bed, sit>supine with minA. RUE supported with pillow, family present, bed rails up, bed alarm on, needs in reach. All questions and concerns addressed from family/patient.   Therapy Documentation Precautions:  Precautions Precautions: Fall Precaution Comments: Increased tone RUE (flexion)/RLE(extension) Required Braces or Orthoses: Cervical Brace Cervical Brace: Soft collar, For comfort Restrictions Weight Bearing Restrictions: No Pain:   Pt denies pain on arrival  Therapy/Group: Individual Therapy  Chayah Mckee P Cedrick Partain PT 02/14/2020, 12:06 PM

## 2020-02-14 NOTE — Progress Notes (Signed)
Occupational Therapy Session Note  Patient Details  Name: Rowen Hur MRN: [id number] Date of Birth: 1945-08-02  Today's Date: 02/14/2020 OT Individual Time: 1445-1600 OT Individual Time Calculation (min): 75 min    Short Term Goals: Week 1:  OT Short Term Goal 1 (Week 1): Pt will recall hemi dressing techniques with min VC OT Short Term Goal 1 - Progress (Week 1): Progressing toward goal OT Short Term Goal 2 (Week 1): Pt will visually attend to R arm during bathing tasks to demo  improve R inattention OT Short Term Goal 2 - Progress (Week 1): Met OT Short Term Goal 3 (Week 1): Pt will transfer to [facility] with MAX A of 1 caregiver to decrease BOC for toileting OT Short Term Goal 3 - Progress (Week 1): Met OT Short Term Goal 4 (Week 1): Pt will locate 2/2 grooming items on R of sink for improved R inattention OT Short Term Goal 4 - Progress (Week 1): Met  Skilled Therapeutic Interventions/Progress Updates:    1:1. Pt received in bed agreeable ot OT. Pt transfers into shower with stedy and shoes donned. Pt completes bathing seated on shower with MIN A to position RLE into figure 4 to pt to wash foot and MOD A for HOH A to wash LUE with RUE for NMR. Pt with active shoulder abduction today on command but flexor synergy activates when attempting something difficult with the LEs. Pt completes dressing with MIN A to don gown seated and total A for brief/shoes for time management to work on [employer] in gym. Pt completes MOD A squat pivot to the L with pt initially attempting ot come into a full stand. Pt practices knee flexion bringing foot to target on floor. Most success performed with OT extending LE out onto OTs foot and pt pulling her foot off and placing underneath R knee. With B hands on R knee for WB and arm extension pt performs squat position lateral weight shifting at EOM for deep WB into R ankle for dorsiflexion stretch and NMR with MIN A. Progressed to red foam wedge under toes to increase gastroc  stretch during WB. Exited session with pt seated in w/c after MIN A squat pivot transfer "crouch" position cue to R, exit alarm on and call light tin reach   Therapy Documentation Precautions:  Precautions Precautions: Fall Precaution Comments: Increased tone RUE (flexion)/RLE(extension) Required Braces or Orthoses: Cervical Brace Cervical Brace: Soft collar, For comfort Restrictions Weight Bearing Restrictions: No General:   Vital Signs: Therapy Vitals Temp: 98.1 F (36.7 C) Temp Source: Oral Pulse Rate: 75 Resp: 16 BP: (!) 109/55 Patient Position (if appropriate): Lying Oxygen Therapy SpO2: 95 % O2 Device: Room Air Pain: Pain Assessment Pain Scale: Faces Faces Pain Scale: No hurt ADL: ADL Upper Body Bathing: Maximal cueing, Moderate assistance Where Assessed-Upper Body Bathing: Wheelchair, Sitting at sink Lower Body Bathing: Maximal assistance, Maximal cueing Where Assessed-Lower Body Bathing: Bed level Upper Body Dressing: Maximal assistance, Maximal cueing Where Assessed-Upper Body Dressing: Sitting at sink, Wheelchair Lower Body Dressing: Dependent Where Assessed-Lower Body Dressing: Bed level (able to bridge hips) Toilet Transfer: Dependent (stedy) Vision   Perception    Praxis   Exercises:   Other Treatments:     Therapy/Group: Individual Therapy  Tonny Branch 02/14/2020, 4:13 PM

## 2020-02-15 ENCOUNTER — Inpatient Hospital Stay (HOSPITAL_COMMUNITY): Payer: Medicare Other | Admitting: Physical Therapy

## 2020-02-15 ENCOUNTER — Inpatient Hospital Stay (HOSPITAL_COMMUNITY): Payer: Medicare Other

## 2020-02-15 LAB — URINALYSIS, COMPLETE (UACMP) WITH MICROSCOPIC
Bilirubin Urine: NEGATIVE
Glucose, UA: NEGATIVE mg/dL
Hgb urine dipstick: NEGATIVE
Ketones, ur: NEGATIVE mg/dL
Nitrite: NEGATIVE
Protein, ur: NEGATIVE mg/dL
Specific Gravity, Urine: 1.024 (ref 1.005–1.030)
pH: 5 (ref 5.0–8.0)

## 2020-02-15 NOTE — Progress Notes (Signed)
[facility] [place] PHYSICAL MEDICINE & REHABILITATION PROGRESS NOTE   Subjective/Complaints: Had a restless night. Spasms effect sleep. Says she feels groggy from spasm medicine yet she hasn't taken for 2 nights.   ROS: Patient denies fever, rash, sore throat, blurred vision, nausea, vomiting, diarrhea, cough, shortness of breath or chest pain,  headache, or mood change.    Objective:   No results found. No results for input(s): WBC, HGB, HCT, PLT in the last 72 hours. No results for input(s): NA, K, CL, CO2, GLUCOSE, BUN, CREATININE, CALCIUM in the last 72 hours.  Intake/Output Summary (Last 24 hours) at 02/15/2020 1135 Last data filed at 02/15/2020 0848 Gross per 24 hour  Intake 356 ml  Output --  Net 356 ml     Physical Exam: Vital Signs Blood pressure 128/77, pulse (!) 107, temperature 97.9 F (36.6 C), resp. rate 18, height 5\' 2"  (1.575 m), weight 60.6 kg, last menstrual period 06/27/1972, SpO2 96 %. Constitutional: No distress . Vital signs reviewed. HEENT: EOMI, oral membranes moist. Ear not visualized Neck: supple Cardiovascular: RRR without murmur. No JVD    Respiratory/Chest: CTA Bilaterally without wheezes or rales. Normal effort    GI/Abdomen: BS +, non-tender, non-distended Ext: no clubbing, cyanosis, or edema Psych: pleasant and cooperative Skin: Clean and intact without signs of breakdown Neuro: alert and oriented x 3. STM deficits.  RUE 2/5 elbow and wrist, RLE 1+ HE, KE. LUE/LLE 4-4+/5 prox to distal. Senses pain in right arm and leg. Flexor tone RUE 1/4. Extensor RLE with heel cord contracture ongoing still present--able to range to -10-15.  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  Physiatrist is providing close team  supervision and 24 hour management of active medical problems listed below.  Physiatrist and rehab team continue to assess barriers to discharge/monitor patient progress toward functional and medical goals  Care Tool:  Bathing    Body parts bathed by patient: Chest, Abdomen, Front perineal area, Face, Right arm, Right upper leg, Left upper leg   Body parts bathed by helper: Left arm, Buttocks, Right lower leg, Left lower leg Body parts n/a: Right upper leg, Left upper leg, Right lower leg, Left lower leg   Bathing assist Assist Level: Moderate Assistance - Patient 50 - 74%     Upper Body Dressing/Undressing Upper body dressing   What is the patient wearing?: Pull over shirt    Upper body assist Assist Level: Moderate Assistance - Patient 50 - 74%    Lower Body Dressing/Undressing Lower body dressing      What is the patient wearing?: Pants     Lower body assist Assist for lower body dressing: Moderate Assistance - Patient 50 - 74%     Toileting Toileting    Toileting assist Assist for toileting: Maximal Assistance - Patient 25 - 49%     Transfers Chair/bed transfer  Transfers assist     Chair/bed transfer assist level: Minimal Assistance - Patient > 75%     Locomotion Ambulation   Ambulation assist   Ambulation activity did not occur: Safety/medical concerns (decreased motor control, R hemi)  Assist level: 2 helpers (mod assist + w/c follow for safety) Assistive device:  (rail) Max distance: 12 ft   Walk 10 feet activity   Assist  Walk 10 feet activity did not occur:  Safety/medical concerns  Assist level: 2 helpers Assistive device:  (rail)   Walk 50 feet activity   Assist Walk 50 feet with 2 turns activity did not occur: Safety/medical concerns         Walk 150 feet activity   Assist Walk 150 feet activity did not occur: Safety/medical concerns         Walk 10 feet on uneven surface  activity   Assist Walk 10 feet on uneven  surfaces activity did not occur: Safety/medical concerns         Wheelchair     Assist Will patient use wheelchair at discharge?: Yes Type of Wheelchair: Manual Wheelchair activity did not occur: Safety/medical concerns (unable with and without skilled intervention due to poor motor planning and attention)  Wheelchair assist level: Moderate Assistance - Patient 50 - 74% Max wheelchair distance: 150 ft    Wheelchair 50 feet with 2 turns activity    Assist    Wheelchair 50 feet with 2 turns activity did not occur: Safety/medical concerns   Assist Level: Moderate Assistance - Patient 50 - 74%   Wheelchair 150 feet activity     Assist  Wheelchair 150 feet activity did not occur: Safety/medical concerns   Assist Level: Supervision/Verbal cueing   Blood pressure 128/77, pulse (!) 107, temperature 97.9 F (36.6 C), resp. rate 18, height 5\' 2"  (1.575 m), weight 60.6 kg, last menstrual period 06/27/1972, SpO2 96 %.  Medical Problem List and Plan: 1. 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             -patient may shower             -ELOS/Goals: 28-32 days/Min A             -Continue CIR therapies with PT, OT, SLP  -adjustable tension night-splint/dorsi-flexion splint to address right heel cord contracture   -8/20 stopped tizanidine per pt request. 2.  Antithrombotics: -DVT/anticoagulation:  Mechanical: Sequential compression devices, below knee Bilateral lower extremities             -antiplatelet therapy: None d/t hemorrhage 3. Chronic neck pain/Pain Management: Uses  Hydrocodone prn at home-->Continue to hold given AMS  -schedule tylenol 500mg  tid  -kpad  - now only taking 1/2 norco at HS.  4. Mood: LCSW to follow for  evaluation and support.              -antipsychotic agents: N/A 5. Neuropsych: This patient is not capable of making decisions on her own behalf.  -appreciate neuropsych input 6. Skin/Wound Care: Routine pressure relief measures.  7. Fluids/Electrolytes/Nutrition:   -labs all reviewed today   -BUN/Cr 12/ 0.96  8/16    -intake generally improved   -f/u Monday labs  8. HTN: Monitor BP tid             Verapamil and lisinopril.                Vitals:   02/14/20 1937 02/15/20 0557  BP: (!) 119/58 128/77  Pulse: 83 (!) 107  Resp: 17 18  Temp: 98.2 F (36.8 C) 97.9 F (36.6 C)  SpO2: 95% 96%  8/20: well controlled 9. Kleb pneumonia UTI: ceftriaxone x3 days and now on keflex to complete 7 day course. abx completed         -  low grade temp--now afebrile  - repeat ua/ucx from 8/17 negative 10. Delirum/AMS:  .   - resolving  -continue 12mg  seroquel at [facility]    11. Dysphagia:   -D3/thins, advance per SLP   13.  Small volume rectal bleeding likely traumatic due to hard stool  -increased stool softeners  -moving bowels 14.  Otitis externa with wax build up on right  S/p debrox drops---wax resolved  -8/16--resumed ciprodex ear gtts per recs of her home ENT  Have reviewed plan with patient       LOS: 17 days A FACE TO FACE EVALUATION WAS PERFORMED  Meredith Staggers 02/15/2020, 11:35 AM

## 2020-02-15 NOTE — Progress Notes (Signed)
Physical Therapy Session Note  Patient Details  Name: Alison Cuevas MRN: [id number] Date of Birth: [birth year]  Today's Date: 02/15/2020 PT Individual Time: 1001-1111 PT Individual Time Calculation (min): 70 min   Short Term Goals: Week 1:  PT Short Term Goal 1 (Week 1): Patient will perform bed mobility with mod A. PT Short Term Goal 1 - Progress (Week 1): Met PT Short Term Goal 2 (Week 1): Patient will perform basic transfers with mod A using LRAD. PT Short Term Goal 2 - Progress (Week 1): Met PT Short Term Goal 3 (Week 1): Patient will initate gait training. PT Short Term Goal 3 - Progress (Week 1): Met PT Short Term Goal 4 (Week 1): Patient will perform standing activities >5 min focused on weight bearing for R LE tone management. PT Short Term Goal 4 - Progress (Week 1): Met  Skilled Therapeutic Interventions/Progress Updates:  Pt was seen bedside in the am on the toilet. Pt performed hygiene with S. Pt transferred sit to stand with min A and verbal cues, dependent transfer with stedy. Pt transported to rehab gym. In gym treatment focused on LE strengthening and NMR. Static stretching R ankle. Pt performed multiple sit to stand and partial stands with min to mod A as well as blocked practice for strengthening. Pt performed multiple squat pivot transfers with min to mod A and verbal cues depending on level of fatigue. Pt propelled w/c back to room about 125 feet with S and verbal cues. Pt rode Nu-step x 5 minutes at level 1. Pt left in group in day room with OT at end of treatment.   Therapy Documentation Precautions:  Precautions Precautions: Fall Precaution Comments: Increased tone RUE (flexion)/RLE(extension) Required Braces or Orthoses: Cervical Brace Cervical Brace: Soft collar, For comfort Restrictions Weight Bearing Restrictions: No General:   Pain: No c/o pain.   Therapy/Group: Individual Therapy  [name] 02/15/2020, 12:23 PM

## 2020-02-15 NOTE — Progress Notes (Signed)
Occupational Therapy Session Note  Patient Details  Name: Alison Cuevas MRN: [id number] Date of Birth: 11/07/1945  Today's Date: 02/15/2020 OT Individual Time: 1300-1400 OT Individual Time Calculation (min): 60 min    Short Term Goals: Week 3:  OT Short Term Goal 1 (Week 3): Pt will don shirt with min A OT Short Term Goal 2 (Week 3): Pt will consistently complete toilet transfers with mod A OT Short Term Goal 3 (Week 3): Pt will don LB clothing with mod A overall  Skilled Therapeutic Interventions/Progress Updates:    Pt received in w/c, reporting need to use the bathroom. No report of pain. Pt completed stand pivot transfer with use of the grab bar with min A. Mod A for clothing management. Incontinent BM occurred during removal of brief and soiled pt's legs and shoe. Max A provided for hygiene. Clean urine sampled obtained for UA- RN alerted. Pt stood for peri hygiene and then upon sitting, had another loose BM. Pt required max A to don brief. Pt transferred back to w/c with min A stand pivot, use of grab bar. Pt removed gown and completed UB bathing at the sink with min A. Pt with significantly improved R shoulder abduction and improvement in ability to bath UB. Pt donned new shirt with min cueing for hemi technique and mod A. Pt donned new pants with mod A, facilitation required to bring BLE into figure 4. Pt donned new socks with mod A. Pt assisted in hair care with mod A. Pt completed stand pivot transfer back to bed with mod A. She was left supine with NT attending to needs.   Therapy Documentation Precautions:  Precautions Precautions: Fall Precaution Comments: Increased tone RUE (flexion)/RLE(extension) Required Braces or Orthoses: Cervical Brace Cervical Brace: Soft collar, For comfort Restrictions Weight Bearing Restrictions: No  Therapy/Group: Individual Therapy  Curtis Sites 02/15/2020, 6:26 AM

## 2020-02-16 NOTE — Progress Notes (Signed)
Patient is complaining of burning while urinating and also feeling as if she still has to urinate after using the bathroom, PVRs remain at 0. Patient is also very concerned because she feels extremely groggy every morning she takes sleeping medication the night before. Discussed with her attempting to not take the sleeping pill tonight and use heat packs and kpad instead to see if that would make a difference, and she said "but the muscle spasms keep me up all night and I need something to help me sleep". Encouraged patient to only ask for half dose of sleeping medication tonight if needed. Patient is extremely concerned because she feels as if she doesn't take sleeping medicine she will be up all night and be sleepy through out the day with therapy, but also states if she takes the sleeping medicine she won't be able to participate in therapy due to sleepiness. Nurse said she would make progress note so doctor could address in the morning. Will also report above statements to oncoming night nurse so they are aware. Will continue plan of care.

## 2020-02-17 ENCOUNTER — Inpatient Hospital Stay (HOSPITAL_COMMUNITY): Payer: Medicare Other

## 2020-02-17 LAB — CBC WITH DIFFERENTIAL/PLATELET
Abs Immature Granulocytes: 0.02 10*3/uL (ref 0.00–0.07)
Basophils Absolute: 0.1 10*3/uL (ref 0.0–0.1)
Basophils Relative: 1 %
Eosinophils Absolute: 0.2 10*3/uL (ref 0.0–0.5)
Eosinophils Relative: 4 %
HCT: 37 % (ref 36.0–46.0)
Hemoglobin: 12.5 g/dL (ref 12.0–15.0)
Immature Granulocytes: 0 %
Lymphocytes Relative: 43 %
Lymphs Abs: 2.3 10*3/uL (ref 0.7–4.0)
MCH: 30.9 pg (ref 26.0–34.0)
MCHC: 33.8 g/dL (ref 30.0–36.0)
MCV: 91.6 fL (ref 80.0–100.0)
Monocytes Absolute: 0.5 10*3/uL (ref 0.1–1.0)
Monocytes Relative: 9 %
Neutro Abs: 2.3 10*3/uL (ref 1.7–7.7)
Neutrophils Relative %: 43 %
Platelets: 189 10*3/uL (ref 150–400)
RBC: 4.04 MIL/uL (ref 3.87–5.11)
RDW: 13.6 % (ref 11.5–15.5)
WBC: 5.3 10*3/uL (ref 4.0–10.5)
nRBC: 0 % (ref 0.0–0.2)

## 2020-02-17 LAB — BASIC METABOLIC PANEL
Anion gap: 14 (ref 5–15)
BUN: 11 mg/dL (ref 8–23)
CO2: 17 mmol/L — ABNORMAL LOW (ref 22–32)
Calcium: 9.3 mg/dL (ref 8.9–10.3)
Chloride: 104 mmol/L (ref 98–111)
Creatinine, Ser: 0.81 mg/dL (ref 0.44–1.00)
GFR calc Af Amer: >60 mL/min (ref >60)
GFR calc non Af Amer: >60 mL/min (ref >60)
Glucose, Bld: 99 mg/dL (ref 70–99)
Potassium: 4.8 mmol/L (ref 3.5–5.1)
Sodium: 135 mmol/L (ref 135–145)

## 2020-02-17 MED ORDER — LISINOPRIL 5 MG PO TABS
5.0000 mg | ORAL_TABLET | Freq: Every day | ORAL | Status: DC
  Administered 2020-02-18 – 2020-02-20 (×3): 5 mg via ORAL
  Filled 2020-02-17 (×3): qty 1

## 2020-02-17 NOTE — Plan of Care (Signed)
  Problem: Consults Goal: RH STROKE PATIENT EDUCATION Description: See Patient Education module for education specifics  Outcome: Progressing Goal: Nutrition Consult-if indicated Outcome: Progressing   Problem: RH BOWEL ELIMINATION Goal: RH STG MANAGE BOWEL WITH ASSISTANCE Description: STG Manage Bowel with min Assistance. Outcome: Progressing   Problem: RH BLADDER ELIMINATION Goal: RH STG MANAGE BLADDER WITH ASSISTANCE Description: STG Manage Bladder With min Assistance Outcome: Progressing   Problem: RH SAFETY Goal: RH STG ADHERE TO SAFETY PRECAUTIONS W/ASSISTANCE/DEVICE Description: STG Adhere to Safety Precautions With min Assistance/Device. Outcome: Progressing Goal: RH STG DECREASED RISK OF FALL WITH ASSISTANCE Description: STG Decreased Risk of Fall With min Assistance. Outcome: Progressing   Problem: RH COGNITION-NURSING Goal: RH STG USES MEMORY AIDS/STRATEGIES W/ASSIST TO PROBLEM SOLVE Description: STG Uses Memory Aids/Strategies With min Assistance to Problem Solve. Outcome: Progressing Goal: RH STG ANTICIPATES NEEDS/CALLS FOR ASSIST W/ASSIST/CUES Description: STG Anticipates Needs/Calls for Assist With min Assistance/Cues. Outcome: Progressing   Problem: RH PAIN MANAGEMENT Goal: RH STG PAIN MANAGED AT OR BELOW PT'S PAIN GOAL Description: Less than 3 Outcome: Progressing   Problem: RH KNOWLEDGE DEFICIT Goal: RH STG INCREASE KNOWLEDGE OF HYPERTENSION Description: Pt will manage with min assist using handouts and booklets Outcome: Progressing Goal: RH STG INCREASE KNOWLEDGE OF DYSPHAGIA/FLUID INTAKE Description: Pt will manage with min assist Outcome: Progressing Goal: RH STG INCREASE KNOWLEDGE OF STROKE PROPHYLAXIS Description: Pt will manage with min assist Outcome: Progressing   Problem: RH BOWEL ELIMINATION Goal: RH STG MANAGE BOWEL W/MEDICATION W/ASSISTANCE Description: STG Manage Bowel with Medication with Assistance. Outcome: Progressing

## 2020-02-17 NOTE — Progress Notes (Signed)
Physical Therapy Session Note  Patient Details  Name: Alison Cuevas MRN: [id number] Date of Birth: July 02, 1945  Today's Date: 02/17/2020 PT Individual Time: 4920-1007 PT Individual Time Calculation (min): 75 min   Short Term Goals: Week 3:  PT Short Term Goal 1 (Week 3): Patient will perform bed mobility with CGA. PT Short Term Goal 2 (Week 3): Patient will propel a manual w/c >25 ft with min A. PT Short Term Goal 3 (Week 3): Patient will ambulate 50 feet using LRAD with mod A. PT Short Term Goal 4 (Week 3): Patient will perform standing balance >5 min with CGA.  Skilled Therapeutic Interventions/Progress Updates:     Patient in w/c in the room upon PT arrival. Patient alert and agreeable to PT session. Patient denied pain during session. Patient reports that she slept well last night, but stated that she feels like she has declined over the past few days. PT educated on w/c schedule and limited therapy/mobility over the weekend leading to increased work of mobility today. Patient overall demonstrated progress with mobility throughout session today.   Transported patient to the gym and applied Kinesiotape with patient seated on elevated mat table. Placed tape for inhibition for gastroc and soleus (distal to proximal with 0-25% stretch) and facilitation for anterior tibialis (proximal to distal with 25-50% stretch). Doffed/donned R sock and shoe with total A for taping.   Therapeutic Activity: Transfers: Patient performed squat pivot transfers w/c>mat table and w/c>bed with min A. Provided cues for L hand placement, head-hips relationship, and blocking R foot and promoting hip/knee flexion to prevent R LE extensor tone activation during transfer. Patient performed sit to/from stand x2 without AD with CGA-min A for balance and with L rail and L HHA with CGA. Provided verbal cues for foot placement and forward weight shift. Blocked R foot throughout due to extensor tone causing R foot to slide  forward.   Gait Training:  Pre-gait: Patient performed standing balance without AD with CGA-close supervision and progressed to weight shift with min A to facilitate shift and for R foot management to prevent inversion and DF.  Patient ambulated 5 feet using RW with R hand splint with mod A +2 for safety balance. Patient with difficulty maintaining R hand in splint due to flexor tone and decreased coordination with distraction from her hand. She then ambulated 30 feet using L rail with min A and w/c follow from a second person then 72 feet with L HHA and facilitation from PT for R foot placement (prevention of inversion and adduction) and heavy cues for sequencing throughout. Patient with poor motor planning and impulsivity with ambulation leading to increased gait speed and decreased coordination leading to LOB x2 during gait trials. Provided cues for sequencing and pacing throughout.    Wheelchair Mobility:  Patient was transported in the w/c with total A throughout session for energy conservation and time management.  Patient seated EOB when handed off to Arma, SLP at end of session.    Therapy Documentation Precautions:  Precautions Precautions: Fall Precaution Comments: Increased tone RUE (flexion)/RLE(extension) Required Braces or Orthoses: Cervical Brace Cervical Brace: Soft collar, For comfort Restrictions Weight Bearing Restrictions: No   Therapy/Group: Individual Therapy  Jahziel Sinn L Kristapher [place] PT, DPT  02/17/2020, 4:51 PM

## 2020-02-17 NOTE — Progress Notes (Signed)
Speech Language Pathology Daily Session Note  Patient Details  Name: Alison Cuevas MRN: [id number] Date of Birth: Mar 23, 1946  Today's Date: 02/17/2020 SLP Individual Time: 1450-1530 SLP Individual Time Calculation (min): 40 min  Short Term Goals: Week 3: SLP Short Term Goal 1 (Week 3): Pt will consume trials of regular textures demonstrating efficient mastication and oral clearance across 2 sessions with no more than min A cues for oral clearance of residue. SLP Short Term Goal 2 (Week 3): Pt will sustain attention to functional tasks for 15 mins with min A verbal cues for redirection. SLP Short Term Goal 3 (Week 3): Patient will utilize external aids to recall daily information with Min A verbal and visual cues. SLP Short Term Goal 4 (Week 3): Patient will demonstrate functional problem solving for basic and familiar tasks with Mod A verbal cues. SLP Short Term Goal 5 (Week 3): Patient will self-monitor and correct errors during functional tasks with Mod A verbal and visual cues.  Skilled Therapeutic Interventions: Skilled ST services focused on swallow and cognitive skills. Pt consumed regular texture trial snack with appropriate oral clearance and no overt s/s aspiration. SLP recommends x1 regular trial prior to diet upgrade. SLP facilitated recall, problem solving and error awareness skill in familiar card task, pt required mod A verbal cues fade to supervision A verbal cues played at basic level. Pt was able to recall 3 rules and apply rules with fading verbal cues. Pt was left with bed alarm set and call bell within reach. SLP recommends to continue skilled ST services.      Pain Pain Assessment Pain Score: 0-No pain  Therapy/Group: Individual Therapy  Akshara Blumenthal  [facility] 02/17/2020, 3:37 PM

## 2020-02-17 NOTE — Progress Notes (Signed)
Occupational Therapy Session Note  Patient Details  Name: Alison Cuevas MRN: [id number] Date of Birth: May 07, 1946  Today's Date: 02/17/2020 OT Individual Time: 0623-7628 OT Individual Time Calculation (min): 54 min    Short Term Goals: Week 3:  OT Short Term Goal 1 (Week 3): Pt will don shirt with min A OT Short Term Goal 2 (Week 3): Pt will consistently complete toilet transfers with mod A OT Short Term Goal 3 (Week 3): Pt will don LB clothing with mod A overall  Skilled Therapeutic Interventions/Progress Updates:    Pt received supine in bed, tearful re weekend events, which she describes as nursing staff being unable to don shoe for transfers. Emotional support provided and ensured signs in room still reflected therapy instruction that shoes must be donned for transfers. Pt completed bed mobility with min A to transition into sitting. Pt completed stand pivot transfer to the w/c with min A after shoes were donned bilaterally with max A (time management). Pt completed sit > stand from the w/c with min A, min facilitation required to prevent RLE inversion at the ankle and leg buckling. Pt completed peri care in standing with min A for balance support. Min A to pull up pants in standing with cueing required for RLE management and hemi method. Pt completed UB bathing with min A for RUE to reach thoroughly under L armpit. Great improvement in RUE volitional movement. Mod A to don shirt. Pt requested to have her hair washed at the sink. Max A provided to assist with hair care to avoid getting water in the R ear. Pt was left sitting up with all needs met, R lap tray in place to provide proper neutral alignment for the RUE.   Therapy Documentation Precautions:  Precautions Precautions: Fall Precaution Comments: Increased tone RUE (flexion)/RLE(extension) Required Braces or Orthoses: Cervical Brace Cervical Brace: Soft collar, For comfort Restrictions Weight Bearing Restrictions:  No   Therapy/Group: Individual Therapy  Curtis Sites 02/17/2020, 6:41 AM

## 2020-02-17 NOTE — Progress Notes (Signed)
[facility] PHYSICAL MEDICINE & REHABILITATION PROGRESS NOTE   Subjective/Complaints: Slept better last night. She would like to wash her hair today. Labs stable Continues to have right ear blockage- does not feel drops have helped.   ROS: Patient denies fever, rash, sore throat, blurred vision, nausea, vomiting, diarrhea, cough, shortness of breath or chest pain,  headache, or mood change.    Objective:   No results found. Recent Labs    02/17/20 0542  WBC 5.3  HGB 12.5  HCT 37.0  PLT 189   Recent Labs    02/17/20 0542  NA 135  K 4.8  CL 104  CO2 17*  GLUCOSE 99  BUN 11  CREATININE 0.81  CALCIUM 9.3    Intake/Output Summary (Last 24 hours) at 02/17/2020 1054 Last data filed at 02/17/2020 0851 Gross per 24 hour  Intake 240 ml  Output --  Net 240 ml     Physical Exam: Vital Signs Blood pressure 114/79, pulse 73, temperature 97.8 F (36.6 C), temperature source Oral, resp. rate 16, height 5\' 2"  (1.575 m), weight 59.3 kg, last menstrual period 06/27/1972, SpO2 96 %. General: Alert and oriented x 3, No apparent distress HEENT: Head is normocephalic, atraumatic, PERRLA, EOMI, sclera anicteric, oral mucosa pink and moist, dentition intact, ext ear canals clear,  Neck: Supple without JVD or lymphadenopathy Heart: Reg rate and rhythm. No murmurs rubs or gallops Chest: CTA bilaterally without wheezes, rales, or rhonchi; no distress Abdomen: Soft, non-tender, non-distended, bowel sounds positive. Extremities: No clubbing, cyanosis, or edema. Pulses are 2+ Psych: pleasant and cooperative Skin: Clean and intact without signs of breakdown Neuro: alert and oriented x 3. STM deficits.  RUE 2/5 elbow and wrist, RLE 1+ HE, KE. LUE/LLE 4-4+/5 prox to distal. Senses pain in right arm and leg. Flexor tone RUE 1/4. Extensor RLE with heel cord contracture ongoing still present--able to range to -10-15.  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  Physiatrist is providing close team supervision and 24 hour management of active medical problems listed below.  Physiatrist and rehab team continue to assess barriers to discharge/monitor patient progress toward functional and medical goals  Care Tool:  Bathing    Body parts bathed by patient: Chest, Abdomen, Front perineal area, Face, Right arm, Right upper leg, Left upper leg   Body parts bathed by helper: Left arm, Buttocks, Right lower leg, Left lower leg Body parts n/a: Right upper leg, Left upper leg, Right lower leg, Left lower leg   Bathing assist Assist Level: Moderate Assistance - Patient 50 - 74%     Upper Body Dressing/Undressing Upper body dressing   What is the patient wearing?: Pull over shirt    Upper body assist Assist Level: Moderate Assistance - Patient 50 - 74%    Lower Body Dressing/Undressing Lower body dressing      What is the patient wearing?: Pants     Lower body assist Assist for lower body dressing: Moderate Assistance - Patient 50 - 74%     Toileting Toileting    Toileting assist Assist for toileting: Maximal Assistance - Patient 25 - 49%     Transfers Chair/bed transfer  Transfers assist     Chair/bed transfer assist level: Moderate Assistance - Patient 50 - 74%     Locomotion  Ambulation   Ambulation assist   Ambulation activity did not occur: Safety/medical concerns (decreased motor control, R hemi)  Assist level: 2 helpers (mod assist + w/c follow for safety) Assistive device:  (rail) Max distance: 12 ft   Walk 10 feet activity   Assist  Walk 10 feet activity did not occur: Safety/medical concerns  Assist level: 2 helpers Assistive device:  (rail)   Walk 50 feet activity   Assist Walk 50 feet with  2 turns activity did not occur: Safety/medical concerns         Walk 150 feet activity   Assist Walk 150 feet activity did not occur: Safety/medical concerns         Walk 10 feet on uneven surface  activity   Assist Walk 10 feet on uneven surfaces activity did not occur: Safety/medical concerns         Wheelchair     Assist Will patient use wheelchair at discharge?: Yes Type of Wheelchair: Manual Wheelchair activity did not occur: Safety/medical concerns (unable with and without skilled intervention due to poor motor planning and attention)  Wheelchair assist level: Supervision/Verbal cueing Max wheelchair distance: 125    Wheelchair 50 feet with 2 turns activity    Assist    Wheelchair 50 feet with 2 turns activity did not occur: Safety/medical concerns   Assist Level: Supervision/Verbal cueing   Wheelchair 150 feet activity     Assist  Wheelchair 150 feet activity did not occur: Safety/medical concerns   Assist Level: Supervision/Verbal cueing   Blood pressure 114/79, pulse 73, temperature 97.8 F (36.6 C), temperature source Oral, resp. rate 16, height 5\' 2"  (1.575 m), weight 59.3 kg, last menstrual period 06/27/1972, SpO2 96 %.  Medical Problem List and Plan: 1. 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             -patient may shower             -ELOS/Goals: 28-32 days/Min A             Continue CIR therapies with PT, OT, SLP  -adjustable tension night-splint/dorsi-flexion splint to address right heel cord contracture   -8/20 stopped tizanidine per pt request. 2.  Antithrombotics: -DVT/anticoagulation:  Mechanical: Sequential compression devices, below knee Bilateral lower extremities              -antiplatelet therapy: None d/t hemorrhage 3. Chronic neck pain/Pain Management: Uses  Hydrocodone prn at home-->Continue to hold given AMS. Well controlled  -schedule tylenol 500mg  tid  -kpad  - now only taking 1/2 norco at HS.  4. Mood: LCSW to follow for evaluation and support.              -antipsychotic agents: N/A 5. Neuropsych: This patient is not capable of making decisions on her own behalf.  -appreciate neuropsych input 6. Skin/Wound Care: Routine pressure relief measures.  7. Fluids/Electrolytes/Nutrition:   -labs all reviewed today   -BUN/Cr 12/ 0.96  8/16    -intake generally improved   8/23 labs stable  8. HTN: Monitor BP tid             Verapamil and lisinopril.                Vitals:   02/17/20 5784 02/17/20 6962  BP: 111/64 114/79  Pulse: 73   Resp: 16   Temp: 97.8 F (36.6 C)   SpO2: 96%   8/23: well controlled to low. Decrease Lisinopril to 5mg .  9. Kleb pneumonia UTI: ceftriaxone x3 days and now on keflex to complete 7 day course. abx completed         -low grade temp--now afebrile  - repeat ua/ucx from 8/17 negative 10. Delirum/AMS:  .   - resolving  -continue 12mg  seroquel at [facility]    11. Dysphagia:   -D3/thins, advance per SLP   13.  Small volume rectal bleeding likely traumatic due to hard stool  -increased stool softeners  -moving bowels 14.  Otitis externa with wax build up on right  S/p debrox drops---wax resolved  -8/16--resumed ciprodex ear gtts per recs of her home ENT  Have reviewed plan with patient       LOS: 19 days A FACE TO FACE EVALUATION WAS PERFORMED  Martha Clan P Rider Ermis 02/17/2020, 10:54 AM

## 2020-02-17 NOTE — Progress Notes (Signed)
Patient ID: Alison Cuevas, female   DOB: 1945/10/18, 74 y.o.   MRN: [id number]  SW followed up/left message with [name] [phone]) to discuss family conference on Friday, and get insight on d/c plan.  *SW spoke with pt dtr and she reports all in agreement for SNF placement. SW informed will follow-up with pt to confirm. SW informed on preliminary referral sent out for SNFs, and informed on which locations extended bed offers and declined. SW informed will send referral to [facility] as preferred location. Family prefers not to have [employer],.   SW left message for GraceAnne/Admissions with [facility] [phone]) to discuss referral. SW waiting on follow-up.   *SW met with pt in room to discuss above, and if she were agreeable to SNF placement. Pt states that she is amenable. SW informed pt that there will be updates on location as her family was helping her find an appropriate location.   Loralee Pacas, MSW, [place] Office: [phone] Cell: [phone] Fax: [phone]

## 2020-02-18 ENCOUNTER — Inpatient Hospital Stay (HOSPITAL_COMMUNITY): Payer: Medicare Other

## 2020-02-18 ENCOUNTER — Inpatient Hospital Stay (HOSPITAL_COMMUNITY): Payer: Medicare Other | Admitting: Physical Therapy

## 2020-02-18 ENCOUNTER — Inpatient Hospital Stay (HOSPITAL_COMMUNITY): Payer: Medicare Other | Admitting: Speech Pathology

## 2020-02-18 LAB — URINE CULTURE: Culture: >=100000 — AB

## 2020-02-18 NOTE — Progress Notes (Signed)
[place] PHYSICAL MEDICINE & REHABILITATION PROGRESS NOTE   Subjective/Complaints: Overall doing ok. Happy with improvement in her strength. Asked if we could stop ear gtts.   ROS: Patient denies fever, rash, sore throat, blurred vision, nausea, vomiting, diarrhea, cough, shortness of breath or chest pain, joint or back pain, headache, or mood change.     Objective:   No results found. Recent Labs    02/17/20 0542  WBC 5.3  HGB 12.5  HCT 37.0  PLT 189   Recent Labs    02/17/20 0542  NA 135  K 4.8  CL 104  CO2 17*  GLUCOSE 99  BUN 11  CREATININE 0.81  CALCIUM 9.3    Intake/Output Summary (Last 24 hours) at 02/18/2020 1258 Last data filed at 02/18/2020 6440 Gross per 24 hour  Intake 358 ml  Output --  Net 358 ml     Physical Exam: Vital Signs Blood pressure 130/71, pulse 83, temperature 97.6 F (36.4 C), resp. rate 17, height 5\' 2"  (1.575 m), weight 59.3 kg, last menstrual period 06/27/1972, SpO2 97 %. Constitutional: No distress . Vital signs reviewed. HEENT: EOMI, oral membranes moist Neck: supple Cardiovascular: RRR without murmur. No JVD    Respiratory/Chest: CTA Bilaterally without wheezes or rales. Normal effort    GI/Abdomen: BS +, non-tender, non-distended Ext: no clubbing, cyanosis, or edema Psych: pleasant and cooperative Skin: Clean and intact without signs of breakdown Neuro: alert and oriented x 3. STM deficits.  RUE 3 to 3+/5 elbow and wrist, RLE 2+ HE, KE, ankle limited by contracture. LUE/LLE 4-4+/5 prox to distal. Senses pain in right arm and leg.   RLE with heel cord contracture ongoing still present--able to range to -10-15 with knee extended.  Musculoskeletal:  mild low back tenderness still present   Assessment/Plan: 1. 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  Physiatrist is providing close team supervision and 24 hour  management of active medical problems listed below.  Physiatrist and rehab team continue to assess barriers to discharge/monitor patient progress toward functional and medical goals  Care Tool:  Bathing    Body parts bathed by patient: Chest, Abdomen, Front perineal area, Face, Right arm, Right upper leg, Left upper leg, Buttocks, Right lower leg, Left lower leg   Body parts bathed by helper: Left arm Body parts n/a: Right upper leg, Left upper leg, Right lower leg, Left lower leg   Bathing assist Assist Level: Minimal Assistance - Patient > 75%     Upper Body Dressing/Undressing Upper body dressing   What is the patient wearing?: Pull over shirt    Upper body assist Assist Level: Minimal Assistance - Patient > 75%    Lower Body Dressing/Undressing Lower body dressing      What is the patient wearing?: Pants     Lower body assist Assist for lower body dressing: Moderate Assistance - Patient 50 - 74%     Toileting Toileting    Toileting assist Assist for toileting: Moderate Assistance - Patient 50 - 74%     Transfers Chair/bed transfer  Transfers assist     Chair/bed transfer assist level: Minimal Assistance - Patient > 75%     Locomotion Ambulation   Ambulation assist   Ambulation activity did not occur: Safety/medical concerns (decreased motor control, R hemi)  Assist level: 2 helpers Assistive device: Hand held assist Max distance: 72 ft   Walk 10 feet activity  Assist  Walk 10 feet activity did not occur: Safety/medical concerns  Assist level: 2 helpers Assistive device: Hand held assist   Walk 50 feet activity   Assist Walk 50 feet with 2 turns activity did not occur: Safety/medical concerns  Assist level: 2 helpers Assistive device: Hand held assist    Walk 150 feet activity   Assist Walk 150 feet activity did not occur: Safety/medical concerns         Walk 10 feet on uneven surface  activity   Assist Walk 10 feet on  uneven surfaces activity did not occur: Safety/medical concerns         Wheelchair     Assist Will patient use wheelchair at discharge?: Yes Type of Wheelchair: Manual Wheelchair activity did not occur: Safety/medical concerns (unable with and without skilled intervention due to poor motor planning and attention)  Wheelchair assist level: Supervision/Verbal cueing Max wheelchair distance: 125    Wheelchair 50 feet with 2 turns activity    Assist    Wheelchair 50 feet with 2 turns activity did not occur: Safety/medical concerns   Assist Level: Supervision/Verbal cueing   Wheelchair 150 feet activity     Assist  Wheelchair 150 feet activity did not occur: Safety/medical concerns   Assist Level: Supervision/Verbal cueing   Blood pressure 130/71, pulse 83, temperature 97.6 F (36.4 C), resp. rate 17, height 5\' 2"  (1.575 m), weight 59.3 kg, last menstrual period 06/27/1972, SpO2 97 %.  Medical Problem List and Plan: 1. 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             -patient may shower             -ELOS/Goals: 28-32 days/Min A             Continue CIR therapies with PT, OT, SLP  -family opting for SNF b/c daughter is starting a new job   -8/24 making nice motor gains in RUE/RLE. Therapy working on heel cord tightness which is improving as well 2.  Antithrombotics: -DVT/anticoagulation:  Mechanical: Sequential compression devices, below knee Bilateral lower extremities             -antiplatelet therapy: None d/t hemorrhage 3. Chronic neck pain/Pain Management: Uses  Hydrocodone prn at home-->Continue to hold given AMS. Well controlled  -scheduled tylenol 500mg  tid  -kpad  - now only taking 1/2 norco at HS.  4. Mood:  LCSW to follow for evaluation and support.              -antipsychotic agents: N/A 5. Neuropsych: This patient is not capable of making decisions on her own behalf.  -appreciate neuropsych input 6. Skin/Wound Care: Routine pressure relief measures.  7. Fluids/Electrolytes/Nutrition:   -labs all reviewed today   -BUN/Cr 12/ 0.96  8/16    -intake generally improved   8/23 labs stable  8. HTN: Monitor BP tid             Verapamil and lisinopril.                Vitals:   02/18/20 0406 02/18/20 0831  BP: 132/62 130/71  Pulse: 83   Resp: 17   Temp: 97.6 F (36.4 C)   SpO2: 97%   8/23: well controlled to low. Decrease Lisinopril to  5mg .  9. Kleb pneumonia UTI: ceftriaxone x3 days and now on keflex to complete 7 day course. abx completed         -low grade temp--now afebrile  - repeat ua/ucx from 8/17 negative 10. Delirum/AMS:  .   - resolving  -continue 12mg  [facility]oquel at [facility]    11. Dysphagia:   -D3/thins, advance per SLP   13.  Small volume rectal bleeding likely traumatic due to hard stool  -increased stool softeners  -moving bowels regularly 14.  Otitis externa with wax build up on right  S/p debrox drops---wax resolved  -8/16--resumed ciprodex ear gtts per recs of her home ENT  8/24 dc'ed drops       LOS: 20 days A FACE TO [place] 02/18/2020, 12:58 PM

## 2020-02-18 NOTE — Progress Notes (Signed)
Occupational Therapy Session Note  Patient Details  Name: Alison Cuevas MRN: [id number] Date of Birth: [birth year]  Today's Date: 02/18/2020 OT Individual Time: 1120-1203 OT Individual Time Calculation (min): 43 min    Short Term Goals: Week 3:  OT Short Term Goal 1 (Week 3): Pt will don shirt with min A OT Short Term Goal 2 (Week 3): Pt will consistently complete toilet transfers with mod A OT Short Term Goal 3 (Week 3): Pt will don LB clothing with mod A overall  Skilled Therapeutic Interventions/Progress Updates:    Pt received supine, tearful re SNF d/c. Provided emotional support and encouragement re CLOF, progress, and potential to return home following SNF stay. Pt agreeable to OT session and getting OOB. Pt reported pain the previous night with leg extensor tone, encouraged pt to ask nursing staff to help provide stretch and repositioning. Pt completed bed mobility with (S) , using only bed rails! She completed squat pivot transfer to the w/c with min A. Pt completed UB bathing at the sink, with extra focus this session on RUE use during ADls, especially as gross assist/stabilization. Pt required min HOH to wash under LUE with the RUE. Pt doffed and donned new gown with min A overall. Mod cueing for RUE use and positioning throughout. Pt was taken to the therapy gym via w/c. She completed HOH UE ergometer with the RUE only and mod facilitation required to maintain grip. Focus on reciprocal movement and high repetitions for motor recovery. Pt thoroughly enjoyed the activity. Pt returned to her room and was left sitting up with all needs met, chair alarm set.   Therapy Documentation Precautions:  Precautions Precautions: Fall Precaution Comments: Increased tone RUE (flexion)/RLE(extension) Required Braces or Orthoses: Cervical Brace Cervical Brace: Soft collar, For comfort Restrictions Weight Bearing Restrictions: No  Therapy/Group: Individual Therapy  Curtis Sites 02/18/2020, 6:55  AM

## 2020-02-18 NOTE — Progress Notes (Signed)
Speech Language Pathology Daily Session Note  Patient Details  Name: Alison Cuevas MRN: [id number] Date of Birth: 02-07-1946  Today's Date: 02/18/2020 SLP Individual Time: 0827-0856 SLP Individual Time Calculation (min): 29 min  Short Term Goals: Week 3: SLP Short Term Goal 1 (Week 3): Pt will consume trials of regular textures demonstrating efficient mastication and oral clearance across 2 sessions with no more than min A cues for oral clearance of residue. SLP Short Term Goal 2 (Week 3): Pt will sustain attention to functional tasks for 15 mins with min A verbal cues for redirection. SLP Short Term Goal 3 (Week 3): Patient will utilize external aids to recall daily information with Min A verbal and visual cues. SLP Short Term Goal 4 (Week 3): Patient will demonstrate functional problem solving for basic and familiar tasks with Mod A verbal cues. SLP Short Term Goal 5 (Week 3): Patient will self-monitor and correct errors during functional tasks with Mod A verbal and visual cues.  Skilled Therapeutic Interventions: Pt was seen for skilled ST targeting dysphagia and cognitive goals. SLP facilitated session with upgraded trial of regular texture solids. Pt required only Supervision A verbal cues for use of swallow precautions and following solids with liquids (due to globus sensation). Fully efficient mastication and oral clearance achieved. Recommend pt upgrade to regular textures, continue thin liquids and meds whole in purees.  Cognitive interventions focused on functional problem solving and recall. Pt demonstrated ability to use note taking strategy and reference that as external aid to recall a question to ask the MD when he arrived with Supervision A verbal cues. She required increased Moderate verbal and visual cues for problem solving and error awareness when calculating time differences between appointments as well as start/end times for her therapy appointments using therapy schedule today.  She became labile during task and intermittently throughout session, stating "I just want to be perfect"; suspect these behaviors negatively influenced her performance today. Emotional support and education regarding the impact of these feelings on her functional problem solving abilities discussed. Pt left sitting in wheelchair with alarm set and needs within reach. Continue per current plan of care.          Pain Pain Assessment Pain Scale: 0-10 Pain Score: 0-No pain  Therapy/Group: Individual Therapy  Arbutus Leas 02/18/2020, 12:07 PM

## 2020-02-18 NOTE — Progress Notes (Addendum)
Physical Therapy Session Note  Patient Details  Name: Alison Cuevas MRN: [id number] Date of Birth: 05/24/1946  Today's Date: 02/18/2020 PT Individual Time: 1415-1530 PT Individual Time Calculation (min): 75 min   Short Term Goals: Week 3:  PT Short Term Goal 1 (Week 3): Patient will perform bed mobility with CGA. PT Short Term Goal 2 (Week 3): Patient will propel a manual w/c >25 ft with min A. PT Short Term Goal 3 (Week 3): Patient will ambulate 50 feet using LRAD with mod A. PT Short Term Goal 4 (Week 3): Patient will perform standing balance >5 min with CGA.  Skilled Therapeutic Interventions/Progress Updates:     Patient in bed upon PT arrival. Patient alert and agreeable to PT session. Patient denied pain during session.  Therapeutic Activity: Bed Mobility: Patient performed supine to sit with CGA for balance/safety. Provided verbal cues for use of R UE to assist with pushing up from R side-lying to sitting. Patient able to actively push throughout full range this session. Donned pants and shoes in sitting with total A for time management and pulled up pants in standing with single UE support.  Transfers: Patient performed sit to/from stand transfers and several stand pivot transfers with min A with PT blocking R foot and patient with L UE support. Provided verbal cues for R foot placement, sequencing for stepping during stand pivot, L hand pushing up to stand, and controlled descent.  Gait Training:  Patient ambulated 5-7 feet x3 using RW with R hand splint with mod A. First trial with R air cast donned with poor inversion control, second trial with R DF ACE wrap with poor control of extensor tone due to reduced traction of tennis shoe, and third trial with tennis shoe only with increased facilitation for R limb swing and reduced inversion with foot placement Ambulated with step to gait pattern, decreased motor and trunk control with RW, and poor sequencing requiring heavy cues  throughout.  Patient ascendedg/descended 4-6" steps using L rail with mod-min A ascending and mod-max A descending due to decreased motor planning and increased extensor tone with inversion of R foot/LE. Performed step-to gait pattern leading with L while ascending and R while descending. Provided cues for technique and sequencing and facilitation for R LE placement throughout.   Wheelchair Mobility:  Patient propelled wheelchair 155 feet with supervision. Provided verbal cues for L hemi-technique, turning technique, and steering with L foot to reduce veering R.   Neuromuscular Re-ed: Patient performed the following standing balance and R LE motor control activities, focused on movement out of synergy: -static standing balance progressing from L UE support to no UE support with PT blocking R foot knee with CGA -step-taps with R foot to first 6" step with L UE support 2x7 with min A for balance and intermittent facilitation for foot placement -step-taps with R foot to second 6" step with L UE support x5 min A for balance and intermittent facilitation for foot placement -step-taps with L foot focused on R weight shift and knee control in stance with min A for balance and facilitation of R weight shift  Patient requested to go to the bathroom at end of session. Transferred from w/c>BSC performing stand pivot as above with use of grab bar and total A for LB clothing management. Patient instructed to use pull cord that is within reach to notify nursing staff when she is ready to transfer back to bed. NT made aware.    Therapy Documentation Precautions:  Precautions  Precautions: Fall Precaution Comments: Increased tone RUE (flexion)/RLE(extension) Required Braces or Orthoses: Cervical Brace Cervical Brace: Soft collar, For comfort Restrictions Weight Bearing Restrictions: No   Therapy/Group: Individual Therapy  Alison Cuevas L Gennaro Lizotte PT, DPT  02/18/2020, 5:38 PM

## 2020-02-18 NOTE — Patient Care Conference (Signed)
Inpatient RehabilitationTeam Conference and Plan of Care Update Date: 02/18/2020   Time: 10:18 AM    Patient Name: Alison Cuevas      Medical Record Number: [id number]  Date of Birth: 18-May-1946 Sex: Female         Room/Bed: 4W13C/4W13C-01 Payor Info: Payor: MEDICARE / Plan: MEDICARE PART A AND B / Product Type: *No Product type* /    Admit Date/Time:  01/29/2020  5:05 PM  Primary Diagnosis:  ICH (intracerebral hemorrhage) [facility])  Hospital Problems: Principal Problem:   ICH (intracerebral hemorrhage) ([place]) - L frontoparietal, hypertensive    Expected Discharge Date: Expected Discharge Date:  (SNF Placement)  Team Members Present: Physician leading conference: Dr. Alger Simons Care Coodinator Present: Loralee Pacas, LCSWA;Ahmani Prehn Creig Hines, RN, BSN, [place] Nurse Present: Toy Cookey, LPN PT Present: Apolinar Junes, PT OT Present: Laverle Hobby, OT SLP Present: Weston Anna, SLP PPS Coordinator present : Ileana Ladd, PT     Current Status/Progress Goal Weekly Team Focus  Bowel/Bladder   Continent of B&B  Pt will remain continent while at the hospital  Assess pt for incontinence q shift and as needed   Swallow/Nutrition/ Hydration   dys 3 textures and thin liquids  supervision A  x1 regular trial then upgrade   ADL's   Min A LB dressing, mod A UB dressing, min-mod A transfers, improved volitional movement in the RUE- full grasp and elbow extension, still limited by flexor tone.  min A overall for ADLs  RUE NMR, ADL retraining, d/c planning, ADL transfers   Mobility   Min A overall, +2 assist gait 72 ft  Min A overall, gait 50 ft using LRAD  Functional mobility, R UE/LE NMR, activity tolerance, gait training, R UE/LE tone management, balance, w/c mobility, patient/caregiver education   Communication             Safety/Cognition/ Behavioral Observations  Mod-Min A, familay supports near baseline  Min A  problem solving, error awareness and recall   Pain   Pt C/O  right leg pain  Pt wiil be controlled by pharmacologic and non-pharmacologic means,  Assess pt for pain q shift and as needed   Skin   skin is intact no rash or breakdown  Skin will remain intact  Assess skin for rashes and breakdown q shift and as needed.     Discharge Planning:  Pt is now SNF. D/c pending bed offer.   Team Discussion: Continent B/B, MD discontinued ear drops. Min/mod assist, has improvement in RUE. Min assist goals. Maintains dorsiflexion with kinesi tape. Min assist squat pivot. Mod assist 72'. SLP - trials of regular textures. Family reports patient is close to baseline as far as SLP is concerned.  Patient on target to meet rehab goals: yes  *See Care Plan and progress notes for long and short-term goals.   Revisions to Treatment Plan:  None  Teaching Needs: Continue family education  Current Barriers to Discharge: None, waiting on SNF placement.  Possible Resolutions to Barriers: Pending bed offer.     Medical Summary Current Status: pain still at times. motor return on right, tone improving too but right heel cord is still tight.  Barriers to Discharge: Medical stability   Possible Resolutions to Barriers/Weekly Focus: daily med adjustments, lab review   Continued Need for Acute Rehabilitation Level of Care: The patient requires daily medical management by a physician with specialized training in physical medicine and rehabilitation for the following reasons: Direction of a multidisciplinary physical rehabilitation program to maximize  functional independence : Yes Medical management of patient stability for increased activity during participation in an intensive rehabilitation regime.: Yes Analysis of laboratory values and/or radiology reports with any subsequent need for medication adjustment and/or medical intervention. : Yes   I attest that I was present, lead the team conference, and concur with the assessment and plan of the team.   Cristi Loron 02/18/2020, 1:54 PM

## 2020-02-18 NOTE — Progress Notes (Signed)
Physical Therapy Session Note  Patient Details  Name: Alison Cuevas MRN: [id number] Date of Birth: 1946-04-25  Today's Date: 02/18/2020 PT Individual Time: 0881-1031 PT Individual Time Calculation (min): 42 min   Short Term Goals: Week 3:  PT Short Term Goal 1 (Week 3): Patient will perform bed mobility with CGA. PT Short Term Goal 2 (Week 3): Patient will propel a manual w/c >25 ft with min A. PT Short Term Goal 3 (Week 3): Patient will ambulate 50 feet using LRAD with mod A. PT Short Term Goal 4 (Week 3): Patient will perform standing balance >5 min with CGA.  Skilled Therapeutic Interventions/Progress Updates:    Pt received sitting in w/c and eager to participate in therapy session.  Transported to/from gym in w/c for time management and energy conservation. Sit<>stands using L UE support on hallway rail with CGA for balance during session. Performed the following gait training trials at L hallway rail with +2 assist for w/c follow - ambulated ~66ft each trial: 1st: no AFO with max assist for R LE management during swing due to significant flexor tone causing hip/knee flexion, hip external rotation, hip adduction, and ankle inversion - requires max assist in stance to block knee hyperextension (does not buckle despite max manual facilitation to prevent hyperextension) 2nd: donned R LE DF assist ACE wrap with eversion bias - demos improving R ankle alignment with decreased inversion during swing; however, continues to have significant knee hyperextension during stance  3rd, 4th, and 5th: donned R LE Ottobock walkon PLS AFO with heel wedge under it to facilitate increased ankle PF during stance and decrease knee hyperextension - demos decreasing R LE tone with improved ability for therapist to facilitate foot placement after advancement in swing but most noticeably continues to have increased hip adduction; demos decreasing R knee hyperextension during stance though continues to require mod  manual facilitation in addition to the brace to block it - with each trial demos increasing gait speed with more reciprocal stepping pattern and improving symmetry in weight shift onto stance limb  Transported back to room in w/c and pt requesting for assistance to don R resting hand splint - therapist donned. Therapist wrote in pt's memory notebook and educated pt on performing active R UE elbow extension throughout the day. Pt left seated in w/c with needs in reach and seat belt alarm on.   Therapy Documentation Precautions:  Precautions Precautions: Fall Precaution Comments: Increased tone RUE (flexion)/RLE(extension) Required Braces or Orthoses: Cervical Brace Cervical Brace: Soft collar, For comfort Restrictions Weight Bearing Restrictions: No  Pain:   No reports of pain throughout session.   Therapy/Group: Individual Therapy  Tawana Scale , PT, DPT, CSRS  02/18/2020, 3:37 PM

## 2020-02-18 NOTE — Plan of Care (Signed)
  Problem: Consults Goal: RH STROKE PATIENT EDUCATION Description: See Patient Education module for education specifics  Outcome: Progressing Goal: Nutrition Consult-if indicated Outcome: Progressing   Problem: RH BOWEL ELIMINATION Goal: RH STG MANAGE BOWEL WITH ASSISTANCE Description: STG Manage Bowel with min Assistance. Outcome: Progressing   Problem: RH BLADDER ELIMINATION Goal: RH STG MANAGE BLADDER WITH ASSISTANCE Description: STG Manage Bladder With min Assistance Outcome: Progressing   Problem: RH SAFETY Goal: RH STG ADHERE TO SAFETY PRECAUTIONS W/ASSISTANCE/DEVICE Description: STG Adhere to Safety Precautions With min Assistance/Device. Outcome: Progressing Goal: RH STG DECREASED RISK OF FALL WITH ASSISTANCE Description: STG Decreased Risk of Fall With min Assistance. Outcome: Progressing   Problem: RH COGNITION-NURSING Goal: RH STG USES MEMORY AIDS/STRATEGIES W/ASSIST TO PROBLEM SOLVE Description: STG Uses Memory Aids/Strategies With min Assistance to Problem Solve. Outcome: Progressing Goal: RH STG ANTICIPATES NEEDS/CALLS FOR ASSIST W/ASSIST/CUES Description: STG Anticipates Needs/Calls for Assist With min Assistance/Cues. Outcome: Progressing   Problem: RH PAIN MANAGEMENT Goal: RH STG PAIN MANAGED AT OR BELOW PT'S PAIN GOAL Description: Less than 3 Outcome: Progressing   Problem: RH KNOWLEDGE DEFICIT Goal: RH STG INCREASE KNOWLEDGE OF HYPERTENSION Description: Pt will manage with min assist using handouts and booklets Outcome: Progressing Goal: RH STG INCREASE KNOWLEDGE OF DYSPHAGIA/FLUID INTAKE Description: Pt will manage with min assist Outcome: Progressing Goal: RH STG INCREASE KNOWLEDGE OF STROKE PROPHYLAXIS Description: Pt will manage with min assist Outcome: Progressing   Problem: RH BOWEL ELIMINATION Goal: RH STG MANAGE BOWEL W/MEDICATION W/ASSISTANCE Description: STG Manage Bowel with Medication with Assistance. Outcome: Progressing

## 2020-02-18 NOTE — Progress Notes (Signed)
Patient ID: Alison Cuevas, female   DOB: March 14, 1946, 74 y.o.   MRN: [id number]  SW spoke with GracieAnne/Admissions with [facility]  ([place]/[phone]) who reported referral received and waiting on if able to accept per administrator. SW waiting on follow-up.  *SW received updates from [facility] indicating that they are able to extend a bed offer, however, they only have semi-private rooms available and only patients that have had COVID vaccine can be in these rooms. SW left message for pt dtr [name] to inform on above and waiting on follow-up.  Pt screened for Level II PASRR. SW will connect with state reviewer Michiel Cowboy [phone]) to complete with patient.   Loralee Pacas, MSW, [place] Office: [phone] Cell: [phone] Fax: [phone]

## 2020-02-19 ENCOUNTER — Inpatient Hospital Stay (HOSPITAL_COMMUNITY): Payer: Medicare Other

## 2020-02-19 ENCOUNTER — Inpatient Hospital Stay (HOSPITAL_COMMUNITY): Payer: Medicare Other | Admitting: Speech Pathology

## 2020-02-19 ENCOUNTER — Inpatient Hospital Stay (HOSPITAL_COMMUNITY): Payer: Medicare Other | Admitting: Occupational Therapy

## 2020-02-19 MED ORDER — VERAPAMIL HCL ER 180 MG PO TBCR: 180.0000 mg | EXTENDED_RELEASE_TABLET | Freq: Every day | ORAL | Status: DC

## 2020-02-19 MED ORDER — PANTOPRAZOLE SODIUM 40 MG PO TBEC: 40.0000 mg | DELAYED_RELEASE_TABLET | Freq: Every day | ORAL | Status: DC

## 2020-02-19 MED ORDER — MECLIZINE HCL 12.5 MG PO TABS: 12.5000 mg | ORAL_TABLET | Freq: Three times a day (TID) | ORAL | 0 refills | Status: DC

## 2020-02-19 MED ORDER — LIDOCAINE 5 % EX PTCH: 1.0000 | MEDICATED_PATCH | Freq: Every day | CUTANEOUS | 0 refills | Status: DC | PRN

## 2020-02-19 MED ORDER — SENNOSIDES-DOCUSATE SODIUM 8.6-50 MG PO TABS: 2.0000 | ORAL_TABLET | Freq: Two times a day (BID) | ORAL | Status: DC

## 2020-02-19 MED ORDER — QUETIAPINE FUMARATE 25 MG PO TABS: 12.0000 mg | ORAL_TABLET | Freq: Every day | ORAL | Status: DC

## 2020-02-19 MED ORDER — ACETAMINOPHEN 500 MG PO TABS: 500.0000 mg | ORAL_TABLET | Freq: Three times a day (TID) | ORAL | 0 refills | Status: DC

## 2020-02-19 NOTE — Progress Notes (Signed)
Speech Language Pathology Daily Session Note  Patient Details  Name: Alison Cuevas MRN: [id number] Date of Birth: 07/25/1945  Today's Date: 02/19/2020 SLP Individual Time: 0930-1000 SLP Individual Time Calculation (min): 30 min  Short Term Goals: Week 3: SLP Short Term Goal 1 (Week 3): Pt will consume trials of regular textures demonstrating efficient mastication and oral clearance across 2 sessions with no more than min A cues for oral clearance of residue. SLP Short Term Goal 2 (Week 3): Pt will sustain attention to functional tasks for 15 mins with min A verbal cues for redirection. SLP Short Term Goal 3 (Week 3): Patient will utilize external aids to recall daily information with Min A verbal and visual cues. SLP Short Term Goal 4 (Week 3): Patient will demonstrate functional problem solving for basic and familiar tasks with Mod A verbal cues. SLP Short Term Goal 5 (Week 3): Patient will self-monitor and correct errors during functional tasks with Mod A verbal and visual cues.  Skilled Therapeutic Interventions: Skilled Therapeutic intervention focused on cognition. Pt completed written semi complex problem solving task with mod A verbal cues. Pt demonstrated difficulty with processing multiple steps and required moderate verbal cues and visual cues to segment each step and to notice and correct errors. Pt benefits from ego -support with new tasks and occasionally appeared overwhelmed with simple written directions likely due to many written details and information on page. Time management calculations completed with max A verbal and visual cues using clock from [facility] as visual cue. Pt left seated upright in wheelchair with chair alarm set and call bell within reach. Cont with therapy per plan of care.      Pain Pain Assessment Pain Scale: Faces Faces Pain Scale: No hurt  Therapy/Group: Individual Therapy  Darrol Poke Montavius Subramaniam 02/19/2020, 10:44 AM

## 2020-02-19 NOTE — Plan of Care (Signed)
  Problem: Consults Goal: RH STROKE PATIENT EDUCATION Description: See Patient Education module for education specifics  Outcome: Progressing Goal: Nutrition Consult-if indicated Outcome: Progressing   Problem: RH BOWEL ELIMINATION Goal: RH STG MANAGE BOWEL WITH ASSISTANCE Description: STG Manage Bowel with min Assistance. Outcome: Progressing   Problem: RH BLADDER ELIMINATION Goal: RH STG MANAGE BLADDER WITH ASSISTANCE Description: STG Manage Bladder With min Assistance Outcome: Progressing   Problem: RH SAFETY Goal: RH STG ADHERE TO SAFETY PRECAUTIONS W/ASSISTANCE/DEVICE Description: STG Adhere to Safety Precautions With min Assistance/Device. Outcome: Progressing Goal: RH STG DECREASED RISK OF FALL WITH ASSISTANCE Description: STG Decreased Risk of Fall With min Assistance. Outcome: Progressing   Problem: RH COGNITION-NURSING Goal: RH STG USES MEMORY AIDS/STRATEGIES W/ASSIST TO PROBLEM SOLVE Description: STG Uses Memory Aids/Strategies With min Assistance to Problem Solve. Outcome: Progressing Goal: RH STG ANTICIPATES NEEDS/CALLS FOR ASSIST W/ASSIST/CUES Description: STG Anticipates Needs/Calls for Assist With min Assistance/Cues. Outcome: Progressing   Problem: RH PAIN MANAGEMENT Goal: RH STG PAIN MANAGED AT OR BELOW PT'S PAIN GOAL Description: Less than 3 Outcome: Progressing   Problem: RH KNOWLEDGE DEFICIT Goal: RH STG INCREASE KNOWLEDGE OF HYPERTENSION Description: Pt will manage with min assist using handouts and booklets Outcome: Progressing Goal: RH STG INCREASE KNOWLEDGE OF DYSPHAGIA/FLUID INTAKE Description: Pt will manage with min assist Outcome: Progressing Goal: RH STG INCREASE KNOWLEDGE OF STROKE PROPHYLAXIS Description: Pt will manage with min assist Outcome: Progressing   Problem: RH BOWEL ELIMINATION Goal: RH STG MANAGE BOWEL W/MEDICATION W/ASSISTANCE Description: STG Manage Bowel with Medication with Assistance. Outcome: Progressing

## 2020-02-19 NOTE — Progress Notes (Signed)
Physical Therapy Session Note  Patient Details  Name: Alison Cuevas MRN: [id number] Date of Birth: 09/10/1945  Today's Date: 02/19/2020 PT Individual Time: 0800-0900 and 1535-1600 PT Individual Time Calculation (min): 60 min and 25 min   Short Term Goals: Week 3:  PT Short Term Goal 1 (Week 3): Patient will perform bed mobility with CGA. PT Short Term Goal 2 (Week 3): Patient will propel a manual w/c >25 ft with min A. PT Short Term Goal 3 (Week 3): Patient will ambulate 50 feet using LRAD with mod A. PT Short Term Goal 4 (Week 3): Patient will perform standing balance >5 min with CGA.  Skilled Therapeutic Interventions/Progress Updates:     Session 1: Patient in bed upon PT arrival. Patient alert and agreeable to PT session. Patient denied pain during session. Reported poor sleep last night due to R LE muscle spasms. Noted increased R PF tone this morning.   Therapeutic Activity: Bed Mobility: Patient performed supine to/from sit with min A for trunk support. Provided verbal cues for hemi technique to the R to come to sitting, encouraged use of R UE to push up. Donned tennis shoes with total A for time managmentt/energy conservation with patient sitting EOB with supervision for sitting balance. Transfers: Patient performed squat pivot bed>w/c and w/c>drop arm BSC over the toilet. She performed sit to/from stand with CGA using a grab bar for LB clothing management with total A. Patient was continent of bowl and bladder during toileting, required min A for peri-care for follow-up wipe and total A for LB clothing management. She also performed sit to/from stand x1 using a L rail with CGA. Provided verbal cues for hand placement and head hips relationship for squat pivot transfers and blocked R foot during all transfers due to R LE extensor tone.  Applied kinesiotape to R LE. Applied for inhibition of gastroc and soleus, applied distal to proximal with 0-15% stretch, and facilitation of anterior  tibialis and peroneus longus, applied proximal to distal with 25-50% stretch.  Gait Training:  Patient ambulated 30 feet using L rail with R LE Ottobock walkon PLS AFO with heel wedge to reduce PF and extensor thrust in stance with min A and a second person for w/c follow for safety. Ambulated with step-to gait pattern with improved R LE tone and ability for therapist to facilitate foot placement after advancement in swing, continues to have increased hip adduction; demos decreasing R knee hyperextension during stance though continues to require mod manual facilitation in addition to the brace to block it. Provided verbal cues for sequencing and R foot placement throughout.  Patient in w/c with R UE elevated on lap tray at end of session with breaks locked, seat belt alarm set, and all needs within reach.   Session 2: Patient visiting with her husband, who she has been unable to see since admission, upon PT arrival for scheduled session. PT re-arranged schedule to return later this afternoon after the patient had visited with her husband.  Patient in bed upon PT return. Patient alert and agreeable to PT session. Patient reported mild-moderate R LE pain/discomfort from muscle spasms during session, RN made aware. PT provided repositioning, rest breaks, and distraction as pain interventions throughout session.   Removed kinesiotape applied in earlier session. Patient's skin without signs of irritation or breakdown after tape removal. Performed manual therapy, including soft tissue mobilization during gentle DF stretch to R gastroc/soleus leading to reduced PF tone and muscle spasm. Also, performed soft tissue mobilization and  elbow extension with shoulder external rotation stretch to R UE leading to reduced flexor tone and improved positioning.   Patient had just learned that she will be leaving tomorrow to d/c to SNF placement. Provided general education to patient's questions on SNF therapy schedule and  goals for mobility while at SNF. Patient expressed that she was sad to be leaving [facility]. PT provided encouragement for patient to continue her progress with therapy and look forward to the next phase of her recovery. Patient appreciative of education and encouragement, stated that she was motivated to continue making progress.   Patient in bed at end of session with breaks locked, bed alarm set, and all needs within reach. New signs applied behind patient's bed and on bathroom door to instruct nursing staff to have patient's shoes on to prevent R ankle inversion during transfers.   Therapy Documentation Precautions:  Precautions Precautions: Fall Precaution Comments: Increased tone RUE (flexion)/RLE(extension) Required Braces or Orthoses: Cervical Brace Cervical Brace: Soft collar, For comfort Restrictions Weight Bearing Restrictions: No   Therapy/Group: Individual Therapy  Rudine Rieger L Caroline Matters PT, DPT  02/19/2020, 5:06 PM

## 2020-02-19 NOTE — Discharge Summary (Signed)
Physician Discharge Summary  Patient ID: Alison Cuevas MRN: [id number] DOB/AGE: [birth year] 74 y.o.  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  Limitations: Position. Comparison Study: No prior study Performing Technologist: Maudry Mayhew MHA, RDMS, RVT, RDCS  Examination Guidelines: A complete evaluation includes B-mode imaging, spectral Doppler, color Doppler, and power Doppler as needed of all accessible portions of each vessel. Bilateral testing is considered an integral part of a complete examination. Limited examinations for reoccurring indications may be performed as noted. The reflux portion of the exam is performed with the patient in reverse Trendelenburg.  +---------+---------------+---------+-----------+----------+--------------+ RIGHT    CompressibilityPhasicitySpontaneityPropertiesThrombus Aging +---------+---------------+---------+-----------+----------+--------------+ CFV      Full           Yes      Yes                                 +---------+---------------+---------+-----------+----------+--------------+ SFJ      Full                                                        +---------+---------------+---------+-----------+----------+--------------+ FV Prox  Full                                                        +---------+---------------+---------+-----------+----------+--------------+ FV Mid   Full                                                         +---------+---------------+---------+-----------+----------+--------------+ FV DistalFull                                                        +---------+---------------+---------+-----------+----------+--------------+ PFV      Full                                                        +---------+---------------+---------+-----------+----------+--------------+ POP      Full           Yes      Yes                                 +---------+---------------+---------+-----------+----------+--------------+ PTV  Full                                                        +---------+---------------+---------+-----------+----------+--------------+ PERO     Full                                                        +---------+---------------+---------+-----------+----------+--------------+   +---------+---------------+---------+-----------+----------+--------------+ LEFT     CompressibilityPhasicitySpontaneityPropertiesThrombus Aging +---------+---------------+---------+-----------+----------+--------------+ CFV      Full           Yes      Yes                                 +---------+---------------+---------+-----------+----------+--------------+ SFJ      Full                                                        +---------+---------------+---------+-----------+----------+--------------+ FV Prox  Full                                                        +---------+---------------+---------+-----------+----------+--------------+ FV Mid   Full                                                        +---------+---------------+---------+-----------+----------+--------------+ FV DistalFull                                                        +---------+---------------+---------+-----------+----------+--------------+ PFV      Full                                                         +---------+---------------+---------+-----------+----------+--------------+ POP      Full           Yes      Yes                                 +---------+---------------+---------+-----------+----------+--------------+ PTV      Full                                                        +---------+---------------+---------+-----------+----------+--------------+  PERO     Full                                                        +---------+---------------+---------+-----------+----------+--------------+     Summary: RIGHT: - There is no evidence of deep vein thrombosis in the lower extremity.  - No cystic structure found in the popliteal fossa.  LEFT: - There is no evidence of deep vein thrombosis in the lower extremity.  - No cystic structure found in the popliteal fossa.  *See table(s) above for measurements and observations. Electronically signed by Harold Barban MD on 02/01/2020 at 1:18:57 AM.    Final     Labs:  Basic Metabolic Panel: BMP Latest Ref Rng & Units 02/17/2020 02/10/2020 02/07/2020  Glucose 70 - 99 mg/dL 99 105(H) 106(H)  BUN 8 - 23 mg/dL 11 12 14   Creatinine 0.44 - 1.00 mg/dL 0.81 0.96 0.83  BUN/Creat Ratio 6 - 22 (calc) - - -  Sodium 135 - 145 mmol/L 135 133(L) 134(L)  Potassium 3.5 - 5.1 mmol/L 4.8 4.1 4.1  Chloride 98 - 111 mmol/L 104 100 102  CO2 22 - 32 mmol/L 17(L) 25 21(L)  Calcium 8.9 - 10.3 mg/dL 9.3 9.2 9.2    CBC: CBC Latest Ref Rng & Units 02/17/2020 02/10/2020 02/03/2020  WBC 4.0 - 10.5 K/uL 5.3 5.9 6.2  Hemoglobin 12.0 - 15.0 g/dL 12.5 12.5 11.5(L)  Hematocrit 36 - 46 % 37.0 37.0 32.7(L)  Platelets 150 - 400 K/uL 189 214 149(L)    CBG: No results for input(s): GLUCAP in the last 168 hours.  Brief HPI:   Alison Cuevas is a 74 y.o. LH-female with history of HTN, anxiety disorder, DDD with chronic pain was admitted via [facility] hospital with sudden onset of right hemiparesis with numbness on 01/26/2020.  She was found to have left frontoparietal  IPH with subdural extension and was transferred to [facility] for management.  Cleviprex added for BP control and CTA head was negative for LVO or AVM.  CT venogram was negative for dural sinus thrombosis.  Dr. Leonie Man felt that bleed was hypertensive in nature and ASA was discontinued.  Follow-up MRI and CT of brain showed stable large IPH.    Hospital course was significant for issues with delirium as well as spasticity with increase in tone RLE.  Baclofen added to help manage symptoms however she developed excessive lethargy, agitation as well as confusion overnight.  UA UC done for work-up and showed evidence of Klebsiella pneumonia and she was started on ceftriaxone for treatment.  Therapy evaluations done revealing right visual field deficit with right inattention, delay in processing, right hemiplegia with tone as well as pusher tendencies affecting ADLs and mobility.  CIR was recommended due to functional decline.   l Course: Alison Cuevas was admitted to rehab 01/29/2020 for inpatient therapies to consist of PT, ST and OT at least three hours five days a week. Past admission physiatrist, therapy team and rehab RN have worked together to provide customized collaborative inpatient rehab. Her blood pressures were monitored on TID basis and and has been managed on verapamil and lisinopril.  Lisinopril was decreased to 5 mg to prevent hypotension.  She completed 7-day course of antibiotic for treatment of Klebsiella pneumonia UTI.  Low-grade fevers have resolved.  Repeat urine culture from  08/17 have been negative.  Mental status has improved with resolution of delirium.  Low-dose Seroquel is being used at bedtime to help with sleep wake disruption.   BLE dopplers done due to hemiplegia and was negative for DVT.   She did have small volume rectal bleeding due to constipation.Her bowel program has been augmented to prevent to hard stools.   Otitis externa with wax buildup has resolved with use of  Debrox drops and Ciprodex was resumed on 08/16 -08/24 to complete recent Rx per ENT.  Dr. Rodenberg/neuropsychologist was consulted for evaluation of mood and has worked with patient on coping strategies to help with elevated anxiety and adjustment issues.  Rehab team has also continue to provide encouragement and ego support during his stay.  Chronic neck pain has been managed with use of Tylenol 500 mg 3 times daily as well as Norco 5 mg at bedtime. PT has been working on ROM to help with right heel contracture.  She has been making moderate gains in right hemiplegia but continues to be limited by extensor tone with decrease in coordination. Family is unable to provide level of care needed and has elected on SNF She was discharged to [facility] on 02/20/20.     Rehab course: During patient's stay in rehab weekly team conferences were held to monitor patient's progress, set goals and discuss barriers to discharge. At admission, patient required total assist with basic ADL tasks and with mobility. She exhibited severe cognitive impairments and required max assist with basic functional tasks. She  has had improvement in activity tolerance, balance, postural control as well as ability to compensate for deficits. She has had improvement in functional use RUE  and RLE as well as improvement in awareness as well as improvement in coordination. She requires min assist for stand pivot transfers with RLE block due to extensor tone and to prevent ankle inversion.  She is able to ambulate 30 feet with left rail and min assist as well as manual facilitation for weight shifting and sequencing.She requires mod verbal cues for complex problem-solving as well as processing for high-level tasks.   Discharge disposition: 03-Skilled Nursing Facility  Diet: heart healthy.   Special Instructions: 1. Medications need to be administered with puree. Intermittent supervision at meals for safety. 2. Place ear plug in right ear  before showering due to has chronic issue with otitis externa.  3. Ice or heat prn to neck/ shoulder. 4. Continue soft collar for neck pain (per patient preference.). Use bilateral PRAFO's and right wrist splint at nights.  5. Recheck BMET in 1 week to decide on need to continue [place].     Allergies as of 02/20/2020      Reactions   Gabapentin Anxiety   Elevated heart rate and crazy dreams   Prednisone Anxiety, Other (See Comments)   REACTION: funny feeling      Medication List    STOP taking these medications   ALPRAZolam 0.5 MG tablet Commonly known as: XANAX   Baclofen 5 MG Tabs   Biotin 10000 MCG Tabs   calcium carbonate 1500 (600 Ca) MG Tabs tablet Commonly known as: OSCAL   cefTRIAXone 1 g in sodium chloride 0.9 % 100 mL   CULTURELLE DIGESTIVE HEALTH PO   famotidine 40 MG tablet Commonly known as: PEPCID   GAS-X PO   Multivitamin Adult Tabs   ondansetron 4 MG tablet Commonly known as: ZOFRAN   POTASSIUM PO   PreviDent 5000 Booster Plus 1.1 % Pste  Generic drug: Sodium Fluoride   vitamin B-12 1000 MCG tablet Commonly known as: CYANOCOBALAMIN   Vitamin D3 125 MCG (5000 UT) Caps     TAKE these medications   acetaminophen 500 MG tablet Commonly known as: TYLENOL Take 1 tablet (500 mg total) by mouth 3 (three) times daily. What changed:   medication strength  how much to take  when to take this  reasons to take this   CALCIUM-MAGNESIUM-ZINC-D3 PO Take 1 tablet by mouth daily.   chlorhexidine 0.12 % solution Commonly known as: PERIDEX 15 mLs by Mouth Rinse route 2 (two) times daily.   fluticasone 50 MCG/ACT nasal spray Commonly known as: FLONASE Place 2 sprays into both nostrils at bedtime.   HYDROcodone-acetaminophen 5-325 MG tablet--Rx 3 pills Commonly known as: NORCO/VICODIN Take 0.5 tablets by mouth at bedtime. What changed:   how much to take  when to take this  reasons to take this   lidocaine 5 % Commonly known as:  LIDODERM Place 1 patch onto the skin daily as needed. Remove & Discard patch within 12 hours or as directed by MD   lisinopril 5 MG tablet Commonly known as: ZESTRIL Take 1 tablet (5 mg total) by mouth daily. Start taking on: February 21, 2020 What changed:   medication strength  how much to take   loratadine 10 MG tablet Commonly known as: CLARITIN Take 10 mg by mouth every other day.   LUBRICATING EYE DROPS OP Place 1 drop into both eyes daily as needed (dry eyes).   meclizine 12.5 MG tablet Commonly known as: ANTIVERT Take 1 tablet (12.5 mg total) by mouth 3 (three) times daily. What changed:   how much to take  how to take this  when to take this  additional instructions   pantoprazole 40 MG tablet Commonly known as: PROTONIX Take 1 tablet (40 mg total) by mouth at bedtime.   potassium chloride SA 20 MEQ tablet Commonly known as: KLOR-CON Take 1 tablet (20 mEq total) by mouth daily. Start taking on: February 21, 2020   QUEtiapine 25 MG tablet Commonly known as: SEROQUEL Take 0.5 tablets (12.5 mg total) by mouth at bedtime.   senna-docusate 8.6-50 MG tablet Commonly known as: Senokot-S Take 2 tablets by mouth 2 (two) times daily. What changed: how much to take   verapamil 180 MG CR tablet Commonly known as: CALAN-SR Take 1 tablet (180 mg total) by mouth daily.       Follow-up Information    Meredith Staggers, MD Follow up.   Specialty: Physical Medicine and Rehabilitation Why: office will call you with follow up appointment Contact information: [place] [address] [phone]        [facility]. Call.   Why: for post stroke follow up Contact information: [address]     [place] [address] [phone]       [name], MD. Call.   Specialty: Family Medicine Why: for follow up after discharge Contact information: [address] [place]  [address] [phone]               Signed: Bary Leriche 02/20/2020, 9:20 AM

## 2020-02-19 NOTE — Progress Notes (Signed)
[facility] PHYSICAL MEDICINE & REHABILITATION PROGRESS NOTE   Subjective/Complaints: No complaints this morning.  Working with therapy. Vitals stable  ROS: Patient denies fever, rash, sore throat, blurred vision, nausea, vomiting, diarrhea, cough, shortness of breath or chest pain, joint or back pain, headache, or mood change.     Objective:   No results found. Recent Labs    02/17/20 0542  WBC 5.3  HGB 12.5  HCT 37.0  PLT 189   Recent Labs    02/17/20 0542  NA 135  K 4.8  CL 104  CO2 17*  GLUCOSE 99  BUN 11  CREATININE 0.81  CALCIUM 9.3    Intake/Output Summary (Last 24 hours) at 02/19/2020 1033 Last data filed at 02/18/2020 1300 Gross per 24 hour  Intake 340 ml  Output --  Net 340 ml     Physical Exam: Vital Signs Blood pressure 111/65, pulse 90, temperature 98.3 F (36.8 C), resp. rate 16, height 5\' 2"  (1.575 m), weight 59.3 kg, last menstrual period 06/27/1972, SpO2 91 %. Constitutional: No distress . Vital signs reviewed. HEENT: EOMI, oral membranes moist Neck: supple Cardiovascular: RRR without murmur. No JVD    Respiratory/Chest: CTA Bilaterally without wheezes or rales. Normal effort    GI/Abdomen: BS +, non-tender, non-distended Ext: no clubbing, cyanosis, or edema Psych: pleasant and cooperative Skin: Clean and intact without signs of breakdown Neuro: alert and oriented x 3. STM deficits.  RUE 3 to 3+/5 elbow and wrist, RLE 2+ HE, KE, ankle limited by contracture. LUE/LLE 4-4+/5 prox to distal. Senses pain in right arm and leg.   RLE with heel cord contracture ongoing still present--able to range to -10-15 with knee extended.  Musculoskeletal:  mild low back tenderness still present   Assessment/Plan: 1. 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  Physiatrist is providing close team supervision and 24 hour management of active  medical problems listed below.  Physiatrist and rehab team continue to assess barriers to discharge/monitor patient progress toward functional and medical goals  Care Tool:  Bathing    Body parts bathed by patient: Chest, Abdomen, Front perineal area, Face, Right arm, Right upper leg, Left upper leg, Buttocks, Right lower leg, Left lower leg   Body parts bathed by helper: Left arm Body parts n/a: Right upper leg, Left upper leg, Right lower leg, Left lower leg   Bathing assist Assist Level: Minimal Assistance - Patient > 75%     Upper Body Dressing/Undressing Upper body dressing   What is the patient wearing?: Pull over shirt    Upper body assist Assist Level: Minimal Assistance - Patient > 75%    Lower Body Dressing/Undressing Lower body dressing      What is the patient wearing?: Pants     Lower body assist Assist for lower body dressing: Moderate Assistance - Patient 50 - 74%     Toileting Toileting    Toileting assist Assist for toileting: Moderate Assistance - Patient 50 - 74%     Transfers Chair/bed transfer  Transfers assist     Chair/bed transfer assist level: Minimal Assistance - Patient > 75%     Locomotion Ambulation   Ambulation assist   Ambulation activity did not occur: Safety/medical concerns (decreased motor control, R hemi)  Assist level: 2 helpers (mod A of 1 and +2 w/c follow) Assistive device: Other (comment) (L hallway rail) Max distance: 11ft   Walk 10  feet activity   Assist  Walk 10 feet activity did not occur: Safety/medical concerns  Assist level: 2 helpers (mod A of 1 and +2 w/c follow) Assistive device: Other (comment) (L hallway rail)   Walk 50 feet activity   Assist Walk 50 feet with 2 turns activity did not occur: Safety/medical concerns  Assist level: 2 helpers Assistive device: Hand held assist    Walk 150 feet activity   Assist Walk 150 feet activity did not occur: Safety/medical concerns          Walk 10 feet on uneven surface  activity   Assist Walk 10 feet on uneven surfaces activity did not occur: Safety/medical concerns         Wheelchair     Assist Will patient use wheelchair at discharge?: Yes Type of Wheelchair: Manual Wheelchair activity did not occur: Safety/medical concerns (unable with and without skilled intervention due to poor motor planning and attention)  Wheelchair assist level: Supervision/Verbal cueing Max wheelchair distance: 125    Wheelchair 50 feet with 2 turns activity    Assist    Wheelchair 50 feet with 2 turns activity did not occur: Safety/medical concerns   Assist Level: Supervision/Verbal cueing   Wheelchair 150 feet activity     Assist  Wheelchair 150 feet activity did not occur: Safety/medical concerns   Assist Level: Supervision/Verbal cueing   Blood pressure 111/65, pulse 90, temperature 98.3 F (36.8 C), resp. rate 16, height 5\' 2"  (1.575 m), weight 59.3 kg, last menstrual period 06/27/1972, SpO2 91 %.  Medical Problem List and Plan: 1. 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             -patient may shower             -ELOS/Goals: 28-32 days/Min A             Continue CIR therapies with PT, OT, SLP  -family opting for SNF b/c daughter is starting a new job   -8/24 making nice motor gains in RUE/RLE. Therapy working on heel cord tightness which is improving as well 2.  Antithrombotics: -DVT/anticoagulation:  Mechanical: Sequential compression devices, below knee Bilateral lower extremities             -antiplatelet therapy: None d/t hemorrhage 3. Chronic neck pain/Pain Management: Uses  Hydrocodone prn at home-->Continue to hold given AMS. Well controlled.  -scheduled  tylenol 500mg  tid  -kpad  - now only taking 1/2 norco at HS.  4. Mood: LCSW to follow for evaluation and support.              -antipsychotic agents: N/A 5. Neuropsych: This patient is not capable of making decisions on her own behalf.  -appreciate neuropsych input 6. Skin/Wound Care: Routine pressure relief measures.  7. Fluids/Electrolytes/Nutrition:   -labs all reviewed today   -BUN/Cr 12/ 0.96  8/16    -intake generally improved   8/23 labs stable  8. HTN: Monitor BP tid             Verapamil and lisinopril.                Vitals:   02/18/20 2001 02/19/20 0917  BP: (!) 129/59 111/65  Pulse: 90   Resp: 16   Temp: 98.3 F (36.8  C)   SpO2: 91%   8/25: well controlled. Continue to monitor.   9. Kleb pneumonia UTI: ceftriaxone x3 days and now on keflex to complete 7 day course. abx completed         -low grade temp--now afebrile  - repeat ua/ucx from 8/17 negative 10. Delirum/AMS:  .   - resolving  -continue 12mg  seroquel at [facility] 11. Dysphagia:   -D3/thins, advance per SLP   13.  Small volume rectal bleeding likely traumatic due to hard stool  -increased stool softeners  -moving bowels regularly 14.  Otitis externa with wax build up on right  S/p debrox drops---wax resolved  -8/16--resumed ciprodex ear gtts per recs of her home ENT  8/24 dc'ed drops       LOS: 21 days A FACE TO [place] 02/19/2020, 10:33 AM

## 2020-02-19 NOTE — Progress Notes (Signed)
Patient ID: Alison Cuevas, female   DOB: [birth year], 74 y.o.   MRN: [id number]  SW met with pt, pt husband and son in room to discuss bed offer at [facility]. Family would like to move forward with placement. Pt amenable to semi private room. Pt has covid vaccine-Moderna. SW informed the facility will want a copy of cards.   SW spoke with GraceAnne/Admissions with [facility] ([place]/[phone]) to accept bed offer. Pt will go into semi-private room. Reports will follow-up with family to arrange who will complete d/c paperwork. SW spoke with [name] [phone]) to inform on above. She states she will complete pt admission paperwork tomorrow after work. SW scheduled [facility] ambulance pick up for 11am. SW spoke with GraceAnne/Admissions with [facility] to inform on above. Reports pt will go to Rm#12; Nurse report [phone]. SW informed medical team.   Loralee Pacas, MSW, [place] Office: [phone] Cell: [phone] Fax: [phone]

## 2020-02-19 NOTE — Progress Notes (Signed)
Occupational Therapy Session Note  Patient Details  Name: Alison Cuevas MRN: [id number] Date of Birth: 16-Oct-1945  Today's Date: 02/19/2020 OT Individual Time: 8592-9244 OT Individual Time Calculation (min): 45 min    Short Term Goals: Week 3:  OT Short Term Goal 1 (Week 3): Pt will don shirt with min A OT Short Term Goal 1 - Progress (Week 3): Progressing toward goal OT Short Term Goal 2 (Week 3): Pt will consistently complete toilet transfers with mod A OT Short Term Goal 2 - Progress (Week 3): Met OT Short Term Goal 3 (Week 3): Pt will don LB clothing with mod A overall OT Short Term Goal 3 - Progress (Week 3): Met Week 4:  OT Short Term Goal 1 (Week 4): Continue working toward LTG  in prep for SNF d/c  Skilled Therapeutic Interventions/Progress Updates:     Pt received sitting in w/c with no c/o pain. CSW entered room and informed pt that she had been accepted to a SNF and will d/c tomorrow. Pt emotional initially and was provided emotional support and encouragement. Pt was provided with foam blocks cut into 1 in squares. Pt was given demo and she practiced gross grasp and facilitated isolated pincer grasp for RUE NMR- mod facilitation overall required. Pt was taken to the therapy gym via w/c. She completed sit > stand with the RW with mod facilitation for R ankle and knee stability and completed cross body functional reaching to challenge RUE/LE weightbearing. Pt completed 2 trials and required seated rest break between. Pt returned to her room and was left sitting up with all needs met, cup of coffee provided.   Therapy Documentation Precautions:  Precautions Precautions: Fall Precaution Comments: Increased tone RUE (flexion)/RLE(extension) Required Braces or Orthoses: Cervical Brace Cervical Brace: Soft collar, For comfort Restrictions Weight Bearing Restrictions: No   Therapy/Group: Individual Therapy  Curtis Sites 02/19/2020, 3:05 PM

## 2020-02-19 NOTE — Progress Notes (Signed)
Occupational Therapy Discharge Summary  Patient Details  Name: Alison Cuevas MRN: [id number] Date of Birth: 1945/08/07  Patient has met 74 of 13  long term goals due to improved activity tolerance, improved balance, postural control, ability to compensate for deficits, functional use of  RIGHT upper and RIGHT lower extremity, improved attention, improved awareness and improved coordination.  Patient to discharge at [facility] Assist level for ADLs and ADL transfers.  Patient's care partner unavailable to provide the necessary physical and cognitive assistance at discharge. Pt is hopeful to continue her rehab efforts at a SNF to hopefully d/c home thereafter.   Reasons goals not met: While pt has made great functional gains in the voluntary activation of her RUE, she remains limited by overall flexor tone. She still requires min A to don shirts/gowns and to use the RUE as a gross assist/stabilizer during ADL tasks.   Recommendation:  Patient will benefit from ongoing skilled OT services in skilled nursing facility setting to continue to advance functional skills in the area of BADL and Reduce care partner burden.  Equipment: No equipment provided  Reasons for discharge: treatment goals met and discharge from hospital  Patient/family agrees with progress made and goals achieved: Yes  OT Discharge Precautions/Restrictions  Precautions Precautions: Fall Precaution Comments: Increased tone RUE (flexion)/RLE(extension) Required Braces or Orthoses: Cervical Brace Cervical Brace: Soft collar;For comfort Restrictions Weight Bearing Restrictions: No General   Vital Signs   Pain   ADL ADL Eating: Set up, Supervision/safety Where Assessed-Eating: Wheelchair Grooming: Setup, Supervision/safety Where Assessed-Grooming: Sitting at sink Upper Body Bathing: Minimal assistance, Minimal cueing Where Assessed-Upper Body Bathing: Sitting at sink Lower Body Bathing: Minimal assistance Where  Assessed-Lower Body Bathing: Sitting at sink, Standing at sink Upper Body Dressing: Minimal assistance Where Assessed-Upper Body Dressing: Sitting at sink Lower Body Dressing: Minimal assistance, Minimal cueing Where Assessed-Lower Body Dressing: Sitting at sink, Standing at sink Toileting: Minimal assistance Where Assessed-Toileting: Glass blower/designer: Psychiatric nurse Method: Arts development officer: Bedside commode, Grab bars Vision Baseline Vision/History: No visual deficits Patient Visual Report: No change from baseline Vision Assessment?: No apparent visual deficits Perception  Perception: Impaired Inattention/Neglect: Does not attend to right side of body (greatly improved) Praxis Praxis: Impaired Praxis Impairment Details: Motor planning Cognition Overall Cognitive Status: Impaired/Different from baseline Arousal/Alertness: Awake/alert Orientation Level: Oriented X4 Attention: Selective Selective Attention: Appears intact Memory: Impaired Memory Impairment: Storage deficit;Retrieval deficit;Decreased recall of new information;Decreased short term memory Decreased Short Term Memory: Verbal basic;Functional complex Awareness: Appears intact Awareness Impairment: Anticipatory impairment Safety/Judgment: Appears intact Sensation Sensation Light Touch: Impaired Detail Light Touch Impaired Details: Impaired RLE;Absent RUE Proprioception: Impaired Detail Proprioception Impaired Details: Absent RUE;Absent RLE Coordination Gross Motor Movements are Fluid and Coordinated: No Fine Motor Movements are Fluid and Coordinated: No Coordination and Movement Description: R hemiplegia and RUE flexor tone, RLE extensor tone Motor  Motor Motor: Hemiplegia Motor - Discharge Observations: R hemi Mobility  Bed Mobility Bed Mobility: Rolling Right;Rolling Left;Supine to Sit;Sit to Supine Rolling Right: Supervision/verbal cueing Rolling Left:  Supervision/Verbal cueing Supine to Sit: Minimal Assistance - Patient > 75% Sit to Supine: Minimal Assistance - Patient > 75% Transfers Sit to Stand: Minimal Assistance - Patient > 75% Stand to Sit: Minimal Assistance - Patient > 75%  Trunk/Postural Assessment  Cervical Assessment Cervical Assessment: Exceptions to [facility] (head forward) Thoracic Assessment Thoracic Assessment: Exceptions to [facility] (rounded shoulders, kyphotic posture) Lumbar Assessment Lumbar Assessment: Exceptions to [facility] (posterior pelvic tilt) Postural Control Postural Control:  Deficits on evaluation (limited by tone and R hemi)  Balance Balance Balance Assessed: Yes Static Sitting Balance Static Sitting - Balance Support: No upper extremity supported;Feet supported Static Sitting - Level of Assistance: 5: Stand by assistance Dynamic Sitting Balance Dynamic Sitting - Balance Support: Feet supported Dynamic Sitting - Level of Assistance: 5: Stand by assistance Static Standing Balance Static Standing - Balance Support: Left upper extremity supported;During functional activity Static Standing - Level of Assistance: 4: Min assist Dynamic Standing Balance Dynamic Standing - Balance Support: Left upper extremity supported;During functional activity Dynamic Standing - Level of Assistance: 4: Min assist Extremity/Trunk Assessment RUE Assessment RUE Assessment: Exceptions to [facility] RUE Body System: Neuro Brunstrum levels for arm and hand: Arm;Hand Brunstrum level for arm: Stage III Synergy is performed voluntarily Brunstrum level for hand: Stage III Synergies performed voluntarily LUE Assessment LUE Assessment: Within Functional Limits   Curtis Sites 02/19/2020, 5:19 PM

## 2020-02-19 NOTE — Progress Notes (Signed)
Occupational Therapy Session Note  Patient Details  Name: Alison Cuevas MRN: [id number] Date of Birth: 1946/06/23  Today's Date: 02/19/2020 OT Individual Time: 3692-2300 OT Individual Time Calculation (min): 43 min    Short Term Goals: Week 3:  OT Short Term Goal 1 (Week 3): Pt will don shirt with min A OT Short Term Goal 1 - Progress (Week 3): Progressing toward goal OT Short Term Goal 2 (Week 3): Pt will consistently complete toilet transfers with mod A OT Short Term Goal 2 - Progress (Week 3): Met OT Short Term Goal 3 (Week 3): Pt will don LB clothing with mod A overall OT Short Term Goal 3 - Progress (Week 3): Met Week 4:  OT Short Term Goal 1 (Week 4): Continue working toward LTG  in prep for SNF d/c  Skilled Therapeutic Interventions/Progress Updates:    Pt greeted at time of session supine in bed resting but agreeable to OT session. Supine to sit EOB primarily CGA but required Min A to bring trunk up to sitting today d/t reports of fatigue. Squat pivot transfer to wheelchair Min A and set up at sink for pt request for UB bathing/dressing. UB bathing Min A for distal support under RUE to bathe under LUE. UB dress Min with hemidressing techniques, mild confusion for problem solving threading RUE and cues throughout for sequencing. PROM/AAROM for RUE seated for shoulder in all planes/movements with passive stretch at end range to decrease flexor tone, also reviewed self ROM for pt to perform. Set up in chair with alarm on, call bell in reach.   Therapy Documentation Precautions:  Precautions Precautions: Fall Precaution Comments: Increased tone RUE (flexion)/RLE(extension) Required Braces or Orthoses: Cervical Brace Cervical Brace: Soft collar, For comfort Restrictions Weight Bearing Restrictions: No     Therapy/Group: Individual Therapy  Viona Gilmore 02/19/2020, 12:09 PM

## 2020-02-19 NOTE — Progress Notes (Addendum)
Physical Therapy Discharge Summary  Patient Details  Name: Alison Cuevas MRN: [id number] Date of Birth: 1946-02-08  Today's Date: 02/19/2020   Patient has met 8 of 11 long term goals due to improved activity tolerance, improved balance, improved postural control, increased strength, increased range of motion, ability to compensate for deficits, functional use of  right upper extremity and right lower extremity, improved attention, improved awareness and improved coordination.  Patient to discharge at a wheelchair level [place].   Patient's care partner unavailable/unable to provide the necessary physical and cognitive assistance at discharge, patient to d/c to SNF to continue working toward mobility goals for reduced caregiver burden at home.  Reasons goals not met: Patient did not attempt a car transfer due to SNF placement and functional challenges/safety concerns with R LE extensor tone. Patient was unable to ambulate safety with a functional AD, currently ambulating 30 ft min A using L rail and 72 feet with +2 assist for L HHA and second person assist for R foot placement.   Recommendation:  Patient will benefit from ongoing skilled PT services in skilled nursing facility setting to continue to advance safe functional mobility, address ongoing impairments in balance, R UE/LE strength, ROM, and hypertonia, functional mobility, gait and stair training, w/c mobility, and minimize fall risk.  Equipment: No equipment provided, to be provided at next level of care  Reasons for discharge: discharge from hospital  Patient/family agrees with progress made and goals achieved: Yes  PT Discharge Precautions/Restrictions Precautions Precautions: Fall Precaution Comments: Increased tone RUE (flexion)/RLE(extension) Required Braces or Orthoses: Cervical Brace Cervical Brace: Soft collar;For comfort Restrictions Weight Bearing Restrictions: No Other Position/Activity Restrictions: watch R ankle  inversion in weight bearing Vision/Perception  Vision - Assessment Eye Alignment: Within Functional Limits Alignment/Gaze Preference: Within Defined Limits Tracking/Visual Pursuits: Able to track stimulus in all quads without difficulty Perception Perception: Impaired Inattention/Neglect: Does not attend to right side of body Praxis Praxis: Impaired Praxis Impairment Details: Motor planning  Cognition Overall Cognitive Status: Impaired/Different from baseline Arousal/Alertness: Awake/alert Orientation Level: Oriented X4 Attention: Selective Focused Attention: Appears intact Sustained Attention: Appears intact Selective Attention: Appears intact Memory: Impaired Memory Impairment: Storage deficit;Retrieval deficit;Decreased recall of new information;Decreased short term memory Decreased Short Term Memory: Verbal basic;Functional complex Awareness: Appears intact Awareness Impairment: Anticipatory impairment Problem Solving: Impaired Problem Solving Impairment: Functional basic Safety/Judgment: Appears intact Sensation Sensation Light Touch: Impaired Detail Light Touch Impaired Details: Impaired RLE;Absent RUE Proprioception: Impaired Detail Proprioception Impaired Details: Absent RUE;Absent RLE Coordination Gross Motor Movements are Fluid and Coordinated: No Fine Motor Movements are Fluid and Coordinated: No Coordination and Movement Description: R hemiplegia and RUE flexor tone, RLE extensor tone Motor  Motor Motor: Hemiplegia Motor - Discharge Observations: R hemi with hypertonia  Mobility Bed Mobility Bed Mobility: Rolling Right;Rolling Left;Supine to Sit;Sit to Supine Rolling Right: Supervision/verbal cueing Rolling Left: Supervision/Verbal cueing Supine to Sit: Contact Guard/Touching assist Sit to Supine: Contact Guard/Touching assist Transfers Transfers: Sit to Stand;Stand to Sit;Stand Pivot Transfers;Squat Pivot Transfers Sit to Stand: Contact Guard/Touching  assist Stand to Sit: Contact Guard/Touching assist Stand Pivot Transfers: Minimal Assistance - Patient > 75% Stand Pivot Transfer Details (indicate cue type and reason): block R foot due to extensor tone, ensure R foot placement due to ankle inversion, and facilitate pivot of R foot Squat Pivot Transfers: Minimal Assistance - Patient > 75% Transfer (Assistive device): None Locomotion  Gait Ambulation: Yes Gait Assistance: Minimal Assistance - Patient > 75% Gait Distance (Feet): 30 Feet (72 feet mod  A +2 with HHA) Assistive device: Other (Comment) (L rail) Gait Assistance Details: Manual facilitation for placement;Manual facilitation for weight shifting;Verbal cues for precautions/safety;Verbal cues for technique;Verbal cues for gait pattern;Verbal cues for sequencing Gait Gait: Yes Gait Pattern: Step-to pattern;Decreased stance time - right;Decreased step length - right;Decreased dorsiflexion - right;Decreased weight shift to right;Right steppage;Right genu recurvatum;Narrow base of support;Poor foot clearance - right Gait velocity: decreased Stairs / Additional Locomotion Stairs: Yes Stairs Assistance: Maximal Assistance - Patient 25 - 49%;Moderate Assistance - Patient 50 - 74% Stair Management Technique: One rail Left Number of Stairs: 4 Height of Stairs: 6 Wheelchair Mobility Wheelchair Mobility: Yes Wheelchair Assistance: Supervision/Verbal cueing Wheelchair Propulsion: Left upper extremity;Left lower extremity Wheelchair Parts Management: Needs assistance Distance: >150 ft  Trunk/Postural Assessment  Cervical Assessment Cervical Assessment: Exceptions to [facility] (head forward) Thoracic Assessment Thoracic Assessment: Exceptions to [facility] (rounded shoulders, kyphotic posture) Lumbar Assessment Lumbar Assessment: Exceptions to [facility] (posterior pelvic tilt) Postural Control Postural Control: Deficits on evaluation (limited by tone and R hemi)  Balance Balance Balance Assessed:  Yes Static Sitting Balance Static Sitting - Balance Support: No upper extremity supported;Feet supported Static Sitting - Level of Assistance: 5: Stand by assistance Dynamic Sitting Balance Dynamic Sitting - Balance Support: Feet supported Dynamic Sitting - Level of Assistance: 5: Stand by assistance Static Standing Balance Static Standing - Balance Support: Left upper extremity supported;During functional activity Static Standing - Level of Assistance: 4: Min assist (CGA) Dynamic Standing Balance Dynamic Standing - Balance Support: Left upper extremity supported;During functional activity Dynamic Standing - Level of Assistance: 4: Min assist Extremity Assessment  RUE Assessment RUE Assessment: Exceptions to [facility] RUE Body System: Neuro Brunstrum levels for arm and hand: Arm;Hand Brunstrum level for arm: Stage III Synergy is performed voluntarily Brunstrum level for hand: Stage III Synergies performed voluntarily LUE Assessment LUE Assessment: Within Functional Limits RLE Assessment RLE Assessment: Exceptions to [facility] Active Range of Motion (AROM) Comments: limited DF to neutral with knee extended and ~5 deg DF with knee flexed after prolonged stretch General Strength Comments: Emerging volitional movement out of synergistic pattern RLE Tone RLE Tone: Hypertonic;Modified Ashworth;Moderate Body Part - Modified Ashworth Scale: Gastrocnemius;Soleus;Hamstrings Hamstrings - Modified Ashworth Scale for Grading Hypertonia RLE: Slight increase in muscle tone, manifested by a catch, followed by minimal resistance throughout the remainder (less than half) of the ROM QUADRICEPS - Modified Ashworth Scale for Grading Hypertonia RLE: Slight increase in muscle tone, manifested by a catch, followed by minimal resistance throughout the remainder (less than half) of the ROM GASTROCNEMIUS - Modified Ashworth Scale for Grading Hypertonia RLE: More marked increase in muscle tone through most of the ROM, but  affected part(s) easily moved SOLEUS - Modified Ashworth Scale for Grading Hypertonia RLE: More marked increase in muscle tone through most of the ROM, but affected part(s) easily moved RLE Tone Comments: DF with inversion of R ankle in weight bearing, uses flexor synergy with R limb advancement in gait LLE Assessment LLE Assessment: Within Functional Limits Active Range of Motion (AROM) Comments: WFL for all functional mobility General Strength Comments: WFL for all functional mobility    Makhari Dovidio L Laquisha Northcraft PT, DPT  02/19/2020, 7:13 PM

## 2020-02-20 ENCOUNTER — Inpatient Hospital Stay (HOSPITAL_COMMUNITY): Payer: Medicare Other

## 2020-02-20 ENCOUNTER — Ambulatory Visit (HOSPITAL_COMMUNITY): Payer: Medicare Other | Admitting: *Deleted

## 2020-02-20 DIAGNOSIS — H60311 Diffuse otitis externa, right ear: Secondary | ICD-10-CM | POA: Diagnosis not present

## 2020-02-20 DIAGNOSIS — Z888 Allergy status to other drugs, medicaments and biological substances status: Secondary | ICD-10-CM | POA: Diagnosis not present

## 2020-02-20 DIAGNOSIS — F4323 Adjustment disorder with mixed anxiety and depressed mood: Secondary | ICD-10-CM | POA: Diagnosis not present

## 2020-02-20 DIAGNOSIS — H04129 Dry eye syndrome of unspecified lacrimal gland: Secondary | ICD-10-CM | POA: Diagnosis not present

## 2020-02-20 DIAGNOSIS — K219 Gastro-esophageal reflux disease without esophagitis: Secondary | ICD-10-CM | POA: Diagnosis not present

## 2020-02-20 DIAGNOSIS — J302 Other seasonal allergic rhinitis: Secondary | ICD-10-CM | POA: Diagnosis not present

## 2020-02-20 DIAGNOSIS — Z7401 Bed confinement status: Secondary | ICD-10-CM | POA: Diagnosis not present

## 2020-02-20 DIAGNOSIS — R410 Disorientation, unspecified: Secondary | ICD-10-CM | POA: Diagnosis not present

## 2020-02-20 DIAGNOSIS — I959 Hypotension, unspecified: Secondary | ICD-10-CM | POA: Diagnosis not present

## 2020-02-20 DIAGNOSIS — E876 Hypokalemia: Secondary | ICD-10-CM | POA: Diagnosis not present

## 2020-02-20 DIAGNOSIS — I619 Nontraumatic intracerebral hemorrhage, unspecified: Secondary | ICD-10-CM | POA: Diagnosis present

## 2020-02-20 DIAGNOSIS — M24561 Contracture, right knee: Secondary | ICD-10-CM | POA: Diagnosis not present

## 2020-02-20 DIAGNOSIS — R531 Weakness: Secondary | ICD-10-CM | POA: Diagnosis not present

## 2020-02-20 DIAGNOSIS — M1990 Unspecified osteoarthritis, unspecified site: Secondary | ICD-10-CM | POA: Diagnosis not present

## 2020-02-20 DIAGNOSIS — R296 Repeated falls: Secondary | ICD-10-CM | POA: Diagnosis not present

## 2020-02-20 DIAGNOSIS — M8580 Other specified disorders of bone density and structure, unspecified site: Secondary | ICD-10-CM | POA: Diagnosis not present

## 2020-02-20 DIAGNOSIS — R278 Other lack of coordination: Secondary | ICD-10-CM | POA: Diagnosis not present

## 2020-02-20 DIAGNOSIS — M797 Fibromyalgia: Secondary | ICD-10-CM | POA: Diagnosis present

## 2020-02-20 DIAGNOSIS — F419 Anxiety disorder, unspecified: Secondary | ICD-10-CM | POA: Diagnosis not present

## 2020-02-20 DIAGNOSIS — I611 Nontraumatic intracerebral hemorrhage in hemisphere, cortical: Secondary | ICD-10-CM | POA: Diagnosis not present

## 2020-02-20 DIAGNOSIS — M25571 Pain in right ankle and joints of right foot: Secondary | ICD-10-CM | POA: Diagnosis not present

## 2020-02-20 DIAGNOSIS — M25551 Pain in right hip: Secondary | ICD-10-CM | POA: Diagnosis not present

## 2020-02-20 DIAGNOSIS — H90A22 Sensorineural hearing loss, unilateral, left ear, with restricted hearing on the contralateral side: Secondary | ICD-10-CM | POA: Diagnosis not present

## 2020-02-20 DIAGNOSIS — H6061 Unspecified chronic otitis externa, right ear: Secondary | ICD-10-CM

## 2020-02-20 DIAGNOSIS — G8111 Spastic hemiplegia affecting right dominant side: Secondary | ICD-10-CM | POA: Diagnosis not present

## 2020-02-20 DIAGNOSIS — G8929 Other chronic pain: Secondary | ICD-10-CM | POA: Diagnosis not present

## 2020-02-20 DIAGNOSIS — G8191 Hemiplegia, unspecified affecting right dominant side: Secondary | ICD-10-CM | POA: Diagnosis not present

## 2020-02-20 DIAGNOSIS — H90A31 Mixed conductive and sensorineural hearing loss, unilateral, right ear with restricted hearing on the contralateral side: Secondary | ICD-10-CM | POA: Diagnosis not present

## 2020-02-20 DIAGNOSIS — I69991 Dysphagia following unspecified cerebrovascular disease: Secondary | ICD-10-CM | POA: Diagnosis not present

## 2020-02-20 DIAGNOSIS — M545 Low back pain, unspecified: Secondary | ICD-10-CM | POA: Diagnosis not present

## 2020-02-20 DIAGNOSIS — K5900 Constipation, unspecified: Secondary | ICD-10-CM | POA: Diagnosis not present

## 2020-02-20 DIAGNOSIS — Z9889 Other specified postprocedural states: Secondary | ICD-10-CM | POA: Diagnosis not present

## 2020-02-20 DIAGNOSIS — I69318 Other symptoms and signs involving cognitive functions following cerebral infarction: Secondary | ICD-10-CM | POA: Diagnosis not present

## 2020-02-20 DIAGNOSIS — N189 Chronic kidney disease, unspecified: Secondary | ICD-10-CM | POA: Diagnosis not present

## 2020-02-20 DIAGNOSIS — M25561 Pain in right knee: Secondary | ICD-10-CM | POA: Diagnosis not present

## 2020-02-20 DIAGNOSIS — N390 Urinary tract infection, site not specified: Secondary | ICD-10-CM | POA: Diagnosis not present

## 2020-02-20 DIAGNOSIS — I10 Essential (primary) hypertension: Secondary | ICD-10-CM | POA: Diagnosis not present

## 2020-02-20 DIAGNOSIS — I69151 Hemiplegia and hemiparesis following nontraumatic intracerebral hemorrhage affecting right dominant side: Secondary | ICD-10-CM | POA: Diagnosis not present

## 2020-02-20 DIAGNOSIS — G43109 Migraine with aura, not intractable, without status migrainosus: Secondary | ICD-10-CM | POA: Diagnosis not present

## 2020-02-20 DIAGNOSIS — H7291 Unspecified perforation of tympanic membrane, right ear: Secondary | ICD-10-CM | POA: Diagnosis not present

## 2020-02-20 DIAGNOSIS — R0902 Hypoxemia: Secondary | ICD-10-CM | POA: Diagnosis not present

## 2020-02-20 DIAGNOSIS — M2550 Pain in unspecified joint: Secondary | ICD-10-CM | POA: Diagnosis not present

## 2020-02-20 DIAGNOSIS — I610 Nontraumatic intracerebral hemorrhage in hemisphere, subcortical: Secondary | ICD-10-CM | POA: Diagnosis not present

## 2020-02-20 DIAGNOSIS — M6281 Muscle weakness (generalized): Secondary | ICD-10-CM | POA: Diagnosis not present

## 2020-02-20 DIAGNOSIS — M79671 Pain in right foot: Secondary | ICD-10-CM | POA: Diagnosis not present

## 2020-02-20 DIAGNOSIS — G4700 Insomnia, unspecified: Secondary | ICD-10-CM | POA: Diagnosis not present

## 2020-02-20 DIAGNOSIS — R5381 Other malaise: Secondary | ICD-10-CM | POA: Diagnosis not present

## 2020-02-20 DIAGNOSIS — J309 Allergic rhinitis, unspecified: Secondary | ICD-10-CM | POA: Diagnosis not present

## 2020-02-20 DIAGNOSIS — M5030 Other cervical disc degeneration, unspecified cervical region: Secondary | ICD-10-CM | POA: Diagnosis not present

## 2020-02-20 DIAGNOSIS — G894 Chronic pain syndrome: Secondary | ICD-10-CM | POA: Diagnosis not present

## 2020-02-20 DIAGNOSIS — R2689 Other abnormalities of gait and mobility: Secondary | ICD-10-CM | POA: Diagnosis not present

## 2020-02-20 LAB — SARS CORONAVIRUS 2 (TAT 6-24 HRS): SARS Coronavirus 2: NEGATIVE

## 2020-02-20 MED ORDER — HYDROCODONE-ACETAMINOPHEN 5-325 MG PO TABS: 0.5000 | ORAL_TABLET | Freq: Every day | ORAL | 0 refills | Status: DC

## 2020-02-20 MED ORDER — LISINOPRIL 5 MG PO TABS: 5.0000 mg | ORAL_TABLET | Freq: Every day | ORAL | Status: DC

## 2020-02-20 MED ORDER — POTASSIUM CHLORIDE CRYS ER 20 MEQ PO TBCR: 20.0000 meq | EXTENDED_RELEASE_TABLET | Freq: Every day | ORAL | Status: DC

## 2020-02-20 NOTE — Progress Notes (Signed)
Inpatient Rehabilitation Care Coordinator  Discharge Note  The overall goal for the admission was met for:   Discharge location: Yes. D/c to SNF- [facility].   Length of Stay: Yes. 21 days.   Discharge activity level: Yes. Max A.   Home/community participation: Yes. Limited.   Services provided included: MD, RD, PT, OT, SLP, RN, CM, TR, Pharmacy, Neuropsych and SW  Financial Services: Medicare  Follow-up services arranged: N/A  Comments (or additional information):  Patient/Family verbalized understanding of follow-up arrangements: Yes  Individual responsible for coordination of the follow-up plan: Pt to have assistance with coordinating care needs.   Confirmed correct DME delivered: Rana Snare 02/20/2020    Rana Snare

## 2020-02-20 NOTE — Progress Notes (Signed)
[name] PHYSICAL MEDICINE & REHABILITATION PROGRESS NOTE   Subjective/Complaints: Overall doing well. Anxious about washing her hair, getting right ear wet. Otherwise happy with progress. Anxious about being a burden to her family also  ROS: Patient denies fever, rash, sore throat, blurred vision, nausea, vomiting, diarrhea, cough, shortness of breath or chest pain,   headache   Objective:   No results found. No results for input(s): WBC, HGB, HCT, PLT in the last 72 hours. No results for input(s): NA, K, CL, CO2, GLUCOSE, BUN, CREATININE, CALCIUM in the last 72 hours.  Intake/Output Summary (Last 24 hours) at 02/20/2020 0926 Last data filed at 02/20/2020 5638 Gross per 24 hour  Intake 780 ml  Output --  Net 780 ml     Physical Exam: Vital Signs Blood pressure 138/65, pulse 79, temperature 98 F (36.7 C), resp. rate 17, height 5\' 2"  (1.575 m), weight 59.5 kg, last menstrual period 06/27/1972, SpO2 96 %. Constitutional: No distress . Vital signs reviewed. HEENT: EOMI, oral membranes moist Neck: supple Cardiovascular: RRR without murmur. No JVD    Respiratory/Chest: CTA Bilaterally without wheezes or rales. Normal effort    GI/Abdomen: BS +, non-tender, non-distended Ext: no clubbing, cyanosis, or edema Psych: pleasant but a little anxious Skin: Clean and intact without signs of breakdown Neuro: alert and oriented x 3. STM deficits.  RUE   3+/5 elbow and wrist with associated flexor tone, RLE 2+ HE, KE, ankle limited by contracture. LUE/LLE 4-4+/5 prox to distal. Senses pain in right arm and leg.   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  Musculoskeletal: mild low back tenderness still present   Assessment/Plan: 1. 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  Physiatrist is providing close  team supervision and 24 hour management of active medical problems listed below.  Physiatrist and rehab team continue to assess barriers to discharge/monitor patient progress toward functional and medical goals  Care Tool:  Bathing    Body parts bathed by patient: Right arm, Left arm, Chest, Abdomen   Body parts bathed by helper: Left arm Body parts n/a: Right upper leg, Left upper leg, Right lower leg, Left lower leg   Bathing assist Assist Level: Minimal Assistance - Patient > 75%     Upper Body Dressing/Undressing Upper body dressing   What is the patient wearing?: Pull over shirt    Upper body assist Assist Level: Minimal Assistance - Patient > 75%    Lower Body Dressing/Undressing Lower body dressing      What is the patient wearing?: Pants     Lower body assist Assist for lower body dressing: Moderate Assistance - Patient 50 - 74%     Toileting Toileting    Toileting assist Assist for toileting: Moderate Assistance - Patient 50 - 74%     Transfers Chair/bed transfer  Transfers assist     Chair/bed transfer assist level: Minimal Assistance - Patient > 75%     Locomotion Ambulation   Ambulation assist   Ambulation activity did not occur: Safety/medical concerns (decreased motor control, R hemi)  Assist level: 2 helpers Assistive device: Hand held assist Max distance: 72 feet (min A 30 ft using L rail)   Walk 10 feet activity   Assist  Walk 10 feet activity did not occur: Safety/medical concerns  Assist level: Minimal Assistance - Patient > 75% Assistive device: Other (comment) (  L rail)   Walk 50 feet activity   Assist Walk 50 feet with 2 turns activity did not occur: Safety/medical concerns  Assist level: 2 helpers Assistive device: Hand held assist    Walk 150 feet activity   Assist Walk 150 feet activity did not occur: Safety/medical concerns (decreased strength/activity tolerance 2/2 R hemiplegia with increased tone)          Walk 10 feet on uneven surface  activity   Assist Walk 10 feet on uneven surfaces activity did not occur: Safety/medical concerns (decreased strength/activity tolerance 2/2 R hemiplegia with increased tone)         Wheelchair     Assist Will patient use wheelchair at discharge?: Yes Type of Wheelchair: Manual Wheelchair activity did not occur: Safety/medical concerns (unable with and without skilled intervention due to poor motor planning and attention)  Wheelchair assist level: Supervision/Verbal cueing Max wheelchair distance: >150 ft    Wheelchair 50 feet with 2 turns activity    Assist    Wheelchair 50 feet with 2 turns activity did not occur: Safety/medical concerns   Assist Level: Supervision/Verbal cueing   Wheelchair 150 feet activity     Assist  Wheelchair 150 feet activity did not occur: Safety/medical concerns   Assist Level: Supervision/Verbal cueing   Blood pressure 138/65, pulse 79, temperature 98 F (36.7 C), resp. rate 17, height 5\' 2"  (1.575 m), weight 59.5 kg, last menstrual period 06/27/1972, SpO2 96 %.  Medical Problem List and Plan: 1. 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             -patient may shower             -dc to SNF today  -want her to see me in office in 2-4 weeks for 300u botox RLE 2.  Antithrombotics: -DVT/anticoagulation:  Mechanical: Sequential compression devices, below knee Bilateral lower extremities             -antiplatelet therapy: None d/t hemorrhage 3. Chronic neck pain/Pain Management: Uses  Hydrocodone prn at home-->Continue to hold given AMS. Well controlled.  -scheduled tylenol 500mg  tid  -kpad  - now only taking 1/2 norco at HS.  4. Mood: LCSW to follow for  evaluation and support.              -antipsychotic agents: N/A 5. Neuropsych: This patient is not capable of making decisions on her own behalf.  -appreciate neuropsych input 6. Skin/Wound Care: Routine pressure relief measures.  7. Fluids/Electrolytes/Nutrition:   -labs all reviewed today   -BUN/Cr 12/ 0.96  8/16    -intake has improved   8/23 labs stable  8. HTN: Monitor BP tid             Verapamil and lisinopril.                Vitals:   02/19/20 1937 02/20/20 0335  BP: 137/63 138/65  Pulse: 78 79  Resp: 17 17  Temp: 97.6 F (36.4 C) 98 F (36.7 C)  SpO2: 97% 96%  8/26: well controlled. Continue to monitor.   9. Kleb pneumonia UTI: ceftriaxone x3 days and now on keflex to complete 7 day course. abx completed         -low grade temp--now afebrile  - repeat  ua/ucx from 8/17 negative 10. Delirum/AMS:  .   - resolving  -continue 12mg  seroquel at [facility] 11. Dysphagia:   -D3/thins, advance per SLP   13.  Small volume rectal bleeding likely traumatic due to hard stool  -increased stool softeners  -moving bowels regularly, resolved 14.  Otitis externa with wax build up on right  S/p debrox drops---wax resolved  -8/16--resumed ciprodex ear gtts per recs of her home ENT  8/24 dc'ed drops  -outpt ENT f/u       LOS: 22 days A FACE TO [place]  Meredith Staggers 02/20/2020, 9:26 AM

## 2020-02-20 NOTE — Discharge Instructions (Signed)
Inpatient Rehab Discharge Instructions  Alison Cuevas Discharge date and time:  02/22/20  Activities/Precautions/ Functional Status: Activity: activity as tolerated Diet: cardiac diet Wound Care: none needed    Functional status:  ___ No restrictions     ___ Walk up steps independently _X__ 24/7 supervision/assistance   ___ Walk up steps with assistance ___ Intermittent supervision/assistance  ___ Bathe/dress independently ___ Walk with walker     _X__ Bathe/dress with assistance ___ Walk Independently    ___ Shower independently ___ Walk with assistance    ___ Shower with assistance ___ No alcohol     ___ Return to work/school ________   Special Instructions:  STROKE/TIA DISCHARGE INSTRUCTIONS SMOKING Cigarette smoking nearly doubles your risk of having a stroke & is the single most alterable risk factor  If you smoke or have smoked in the last 12 months, you are advised to quit smoking for your health.  Most of the excess cardiovascular risk related to smoking disappears within a year of stopping.  Ask you doctor about anti-smoking medications  [place] Quit Line: 1-800-QUIT NOW  Free Smoking Cessation Classes [phone]  CHOLESTEROL Know your levels; limit fat & cholesterol in your diet  Lipid Panel     Component Value Date/Time   CHOL 163 01/28/2020 0341   TRIG 33 01/28/2020 0341   HDL 75 01/28/2020 0341   CHOLHDL 2.2 01/28/2020 0341   VLDL 7 01/28/2020 0341   LDLCALC 81 01/28/2020 0341      Many patients benefit from treatment even if their cholesterol is at goal.  Goal: Total Cholesterol (CHOL) less than 160  Goal:  Triglycerides (TRIG) less than 150  Goal:  HDL greater than 40  Goal:  LDL (LDLCALC) less than 100   BLOOD PRESSURE American Stroke Association blood pressure target is less that 120/80 mm/Hg  Your discharge blood pressure is:  BP: 138/65  Monitor your blood pressure  Limit your salt and alcohol intake  Many individuals will require more  than one medication for high blood pressure  DIABETES (A1c is a blood sugar average for last 3 months) Goal HGBA1c is under 7% (HBGA1c is blood sugar average for last 3 months)  Diabetes:     Lab Results  Component Value Date   HGBA1C 5.0 01/28/2020     Your HGBA1c can be lowered with medications, healthy diet, and exercise.  Check your blood sugar as directed by your physician  Call your physician if you experience unexplained or low blood sugars.  PHYSICAL ACTIVITY/REHABILITATION Goal is 30 minutes at least 4 days per week  Activity: Increase activity slowly, Therapies:  Return to work: N/A  Activity decreases your risk of heart attack and stroke and makes your heart stronger.  It helps control your weight and blood pressure; helps you relax and can improve your mood.  Participate in a regular exercise program.  Talk with your doctor about the best form of exercise for you (dancing, walking, swimming, cycling).  DIET/WEIGHT Goal is to maintain a healthy weight  Your discharge diet is:  Diet Order            Diet regular Room service appropriate? Yes; Fluid consistency: Thin  Diet effective now                liquids Your height is:  Height: 5\' 2"  (157.5 cm) Your current weight is: Weight: 59.5 kg Your Body Mass Index (BMI) is:  BMI (Calculated): 23.99  Following the type of diet specifically designed for  you will help prevent another stroke.  You are at goal weight   Your goal Body Mass Index (BMI) is 19-24.  Healthy food habits can help reduce 3 risk factors for stroke:  High cholesterol, hypertension, and excess weight.  RESOURCES Stroke/Support Group:  Call [phone]   STROKE EDUCATION PROVIDED/REVIEWED AND GIVEN TO PATIENT Stroke warning signs and symptoms How to activate emergency medical system (call 911). Medications prescribed at discharge. Need for follow-up after discharge. Personal risk factors for stroke. Pneumonia vaccine given:  Flu vaccine  given:  My questions have been answered, the writing is legible, and I understand these instructions.  I will adhere to these goals & educational materials that have been provided to me after my discharge from the hospital.     My questions have been answered and I understand these instructions. I will adhere to these goals and the provided educational materials after my discharge from the hospital.  Patient/Caregiver Signature _______________________________ Date __________  Clinician Signature _______________________________________ Date __________  Please bring this form and your medication list with you to all your follow-up doctor's appointments.

## 2020-02-20 NOTE — Progress Notes (Signed)
Patient's PDMP was reviewed for the lasts 12 months prior to Rx Vicodin 5/325 1/2 tab at [facility]--. #3 tabs/no refills

## 2020-02-20 NOTE — Plan of Care (Signed)
Pt to d/c to SNF via [facility] at 11;00

## 2020-02-20 NOTE — Progress Notes (Signed)
Speech Language Pathology Discharge Summary  Patient Details  Name: Athaliah Baumbach MRN: [id number] Date of Birth: 09-Jan-1946  Today's Date: 02/20/2020     Patient has met 5 of 6 long term goals.  Patient to discharge at overall Min;Supervision level.  Reasons goals not met: decreased awareness   Clinical Impression/Discharge Summary:   Pt has made consistent gains and has met 5 out of 6 LTG's this admission due to improved sustained attention, problem solving, memory, and error awareness. Pt is overall supervision A for basic problem solving, attention, and recall of information while using external aids. Pt is min A for intellectual awareness of deficits.  Pt continues to require moderate verbal cues with unfamiliar and semi complex problem solving tasks. Pt will discharge to SNF and will benefit from continued SLP services to maximize cognition and functional independence.  Care Partner:  Caregiver Able to Provide Assistance: No;Other (comment) (Pt dc'd to SNF.)     Recommendation:  Skilled Nursing facility  Rationale for SLP Follow Up: Maximize cognitive function and independence;Maximize swallowing safety   Equipment: none   Reasons for discharge: Treatment goals met;Discharged from hospital   Patient/Family Agrees with Progress Made and Goals Achieved: Yes    Darrol Poke Toiya Morrish 02/20/2020, 2:31 PM

## 2020-02-20 NOTE — Progress Notes (Signed)
Recreational Therapy Discharge Summary Patient Details  Name: Alison Cuevas MRN: [id number] Date of Birth: 1945-07-18 Today's Date: 02/20/2020  Long term goals set: 1  Long term goals met: 0  Comments on progress toward goals: Pt is discharging to SNF today for continued therapies and 24 hour supervision/assistance.  TR goal not achieved due to LRT absence and change in pts discharge plan.  Pt is now scheduled for SNF discharge today vs home with family 9/2.   Reasons for discharge: discharge from hospital  Follow-up: Encourage particpation in activities programming  Patient/family agrees with progress made and goals achieved: Yes  [name] 02/20/2020, 8:44 AM

## 2020-02-20 NOTE — Progress Notes (Signed)
Patient ID: Alison Cuevas, female   DOB: 11/01/1945, 74 y.o.   MRN: [id number]   NCPASRR# [id number] H  SW sent d/c summary in Epic. SW spoke with GraceAnne/Admissions with [facility] on above. SW spoke with charge RN who will be calling to give report.    Loralee Pacas, MSW, [place] Office: [phone] Cell: [phone] Fax: [phone]

## 2020-02-24 ENCOUNTER — Telehealth: Payer: Self-pay | Admitting: *Deleted

## 2020-02-24 DIAGNOSIS — I10 Essential (primary) hypertension: Secondary | ICD-10-CM | POA: Diagnosis not present

## 2020-02-24 DIAGNOSIS — R410 Disorientation, unspecified: Secondary | ICD-10-CM | POA: Diagnosis not present

## 2020-02-24 DIAGNOSIS — N390 Urinary tract infection, site not specified: Secondary | ICD-10-CM | POA: Diagnosis not present

## 2020-02-24 DIAGNOSIS — J302 Other seasonal allergic rhinitis: Secondary | ICD-10-CM | POA: Diagnosis not present

## 2020-02-24 DIAGNOSIS — I619 Nontraumatic intracerebral hemorrhage, unspecified: Secondary | ICD-10-CM | POA: Diagnosis not present

## 2020-02-24 NOTE — Telephone Encounter (Signed)
Transitional Care Call completed.  Appointment switched to September 8th at 3:40pm with Dr. Naaman Plummer  Dr. Naaman Plummer wishes to provide Botox treatment at this visit.  Patient is currently staying at [facility]  Daughter answered transitional care questions to the best of her ability.  Transitional Care Questions   1. Are you/is patient experiencing any problems since coming home?  Patient is staying at [facility] Are there any questions regarding any aspect of care?  No  2. Are there any questions regarding medications administration/dosing?  No Are meds being taken as prescribed? Patients daughter believes that patient is taking medications as prescribed/ as administered by SNF clinical staff  Patient should review meds with caller to confirm   3. Have there been any falls? No falls reported  4. Has Home Health been to the house and/or have they contacted you? No home health, inpatient rehab If not, have you tried to contact them? Can we help you contact them?   5. Are bowels and bladder emptying properly? No issues have been reported Are there any unexpected incontinence issues? If applicable, is patient following bowel/bladder programs?   6. Any fevers, problems with breathing, unexpected pain? None reported  7. Are there any skin problems or new areas of breakdown?  None reported  8. Has the patient/family member arranged specialty MD follow up (ie cardiology/neurology/renal/surgical/etc)? Unsure Can we help arrange?   9. Does the patient need any other services or support that we can help arrange? Not at the moment  10. Are caregivers following through as expected in assisting the patient? Patient being cared for at SNF   11. Has the patient quit smoking, drinking alcohol, or using drugs as recommended? Never smoker, never drinker, never illicit drug user

## 2020-02-25 DIAGNOSIS — F419 Anxiety disorder, unspecified: Secondary | ICD-10-CM | POA: Diagnosis not present

## 2020-02-25 DIAGNOSIS — G4700 Insomnia, unspecified: Secondary | ICD-10-CM | POA: Diagnosis not present

## 2020-02-27 DIAGNOSIS — I10 Essential (primary) hypertension: Secondary | ICD-10-CM | POA: Diagnosis not present

## 2020-02-27 DIAGNOSIS — K219 Gastro-esophageal reflux disease without esophagitis: Secondary | ICD-10-CM | POA: Diagnosis not present

## 2020-02-27 DIAGNOSIS — I619 Nontraumatic intracerebral hemorrhage, unspecified: Secondary | ICD-10-CM | POA: Diagnosis not present

## 2020-02-27 DIAGNOSIS — R5381 Other malaise: Secondary | ICD-10-CM | POA: Diagnosis not present

## 2020-02-27 DIAGNOSIS — G4700 Insomnia, unspecified: Secondary | ICD-10-CM | POA: Diagnosis not present

## 2020-02-27 DIAGNOSIS — N189 Chronic kidney disease, unspecified: Secondary | ICD-10-CM | POA: Diagnosis not present

## 2020-03-04 ENCOUNTER — Encounter: Payer: Medicare Other | Attending: Physical Medicine & Rehabilitation | Admitting: Physical Medicine & Rehabilitation

## 2020-03-04 ENCOUNTER — Other Ambulatory Visit: Payer: Self-pay

## 2020-03-04 ENCOUNTER — Encounter: Payer: Self-pay | Admitting: Physical Medicine & Rehabilitation

## 2020-03-04 VITALS — BP 138/65 | HR 79 | Temp 98.0°F | Resp 17 | Ht 60.0 in | Wt 131.7 lb

## 2020-03-04 DIAGNOSIS — M797 Fibromyalgia: Secondary | ICD-10-CM | POA: Insufficient documentation

## 2020-03-04 DIAGNOSIS — G8111 Spastic hemiplegia affecting right dominant side: Secondary | ICD-10-CM | POA: Diagnosis not present

## 2020-03-04 DIAGNOSIS — I610 Nontraumatic intracerebral hemorrhage in hemisphere, subcortical: Secondary | ICD-10-CM | POA: Insufficient documentation

## 2020-03-04 DIAGNOSIS — I619 Nontraumatic intracerebral hemorrhage, unspecified: Secondary | ICD-10-CM

## 2020-03-04 NOTE — Progress Notes (Signed)
Subjective:    Patient ID: Alison Cuevas, female    DOB: 1946/03/14, 74 y.o.   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 She is currently at a SNf which is on lock-down d/t a COVID outbreak.  Alison Cuevas tells me that she is getting some therapy at the facility.  Its not as much as she was getting with [place] before obviously.  She feels that her right ankle might be getting a bit tighter than it had been previously.  They have tried apparently an AFO with her.  She states that her pain levels have been better.  She would like to change her nighttime hydrocodone to as needed but apparently they are still giving it to her on a scheduled basis.  The medication can cause some grogginess at times especially when she is using it regularly.  She still taking Xanax as needed for anxiety.  Overall her swallowing is stable.  She is still on D3 diet per her request.  Her bladder is functioning normally now she denies any symptoms consistent with a UTI.  Her right ear still gives her problems from time to time.    Pain Inventory Average Pain 0 Pain Right Now 0 My pain is not having pain currently  In the last 24 hours, has pain interfered with the following? General activity 0 Relation with others 0 Enjoyment of life 0 What TIME of day is your pain at its worst? varies Sleep (in general) has been sleeping too much  Pain is worse with: na Pain improves with: na Relief from Meds: na  needs help with transfers  I need assistance with the following:  dressing, bathing, toileting, meal prep, household duties and shopping  weakness  Any changes since last visit?  yes  In SNF  IN SNF    Family History  Problem Relation Age of Onset  . Hypertension Mother   . Diverticulitis Mother   . Breast cancer Sister 44  . Melanoma Brother   . Prostate cancer Brother   . Leukemia Brother   . Lupus Sister   . Lung  disease Brother   . Stomach cancer Father   . Other Other        Family member with MGUS   Social History   Socioeconomic History  . Marital status: Married    Spouse name: [name]  . Number of children: 3  . Years of education: Not on file  . Highest education level: Not on file  Occupational History  . Occupation: retired  Tobacco Use  . Smoking status: Never Smoker  . Smokeless tobacco: Never Used  Vaping Use  . Vaping Use: Never used  Substance and Sexual Activity  . Alcohol use: No    Alcohol/week: 0.0 standard drinks  . Drug use: No  . Sexual activity: Never    Birth control/protection: Surgical    Comment: 1st intercourse 63 yo-1 partner  Other Topics Concern  . Not on file  Social History Narrative   Married, has 3 children (one lives near her).  Has 8 grandchildren, 2 greatgrandchildren.   Worked cleaning apartments, worked at [employer], was a Secretary/administrator.   She retired at age 57.   No tobacco.  No alcohol.  No drugs.   Exercise: clean, work in yard.        10 siblings in all--5 have passed away--1 child age 2 with  whooping cough , 1 lupus, 1 melanoma,1 lung cancer, 1 pulm fibrosis   Social Determinants of Health   Financial Resource Strain:   . Difficulty of Paying Living Expenses: Not on file  Food Insecurity:   . Worried About Charity fundraiser in the Last Year: Not on file  . Ran Out of Food in the Last Year: Not on file  Transportation Needs:   . Lack of Transportation (Medical): Not on file  . Lack of Transportation (Non-Medical): Not on file  Physical Activity:   . Days of Exercise per Week: Not on file  . Minutes of Exercise per Session: Not on file  Stress:   . Feeling of Stress : Not on file  Social Connections:   . Frequency of Communication with Friends and Family: Not on file  . Frequency of Social Gatherings with Friends and Family: Not on file  . Attends Religious Services: Not on file  . Active Member of Clubs or  Organizations: Not on file  . Attends Archivist Meetings: Not on file  . Marital Status: Not on file   Past Surgical History:  Procedure Laterality Date  . ABDOMINAL HYSTERECTOMY  03/2015   TAH.  Last pap 2016.  No hx of abnl paps.  Per GYN, no further pap smears indicated.  . ANTERIOR AND POSTERIOR REPAIR N/A 03/18/2014   Procedure: Cystocele repair with graft, Vault suspension, Rectocele repair;  Surgeon: [name], MD;  Location: [facility];  Service: Urology;  Laterality: N/A;  . CHOLECYSTECTOMY    . CHOLECYSTECTOMY, LAPAROSCOPIC    . COLON SURGERY    . COLONOSCOPY  04/2006; 05/26/16   2007 (Dr. [name]): Normal.  04/2016 (Dr. [name]) normal except diverticulosis and decreased anal sphincter tone.  No repeat colonoscopy is recommended due to age.  . cspine surgery     Dr. Wiliam Ke level ant cerv discectomy /fusion w/plating  . CYSTO N/A 03/18/2014   Procedure: CYSTO;  Surgeon: [name], MD;  Location: [facility];  Service: Urology;  Laterality: N/A;  . DEXA  07/30/2015; 06/2018   2017 and 2020 -->Osteopenia--repeat 2 yrs.  Marland Kitchen LYSIS OF ADHESION N/A 01/31/2018   Procedure: POSSIBLE LYSIS OF ADHESION;  Surgeon: Everitt Amber, MD;  Location: [facility];  Service: Gynecology;  Laterality: N/A;  . [place]     with prolasped bledder repair  . RESECTION OF COLON     BENIGN TUMOR  . RIGHT EAR SURGERY  08/23/2017   Dr. 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 right ear surgery  12/12/2018   Right tympanomastoidectomy, ossiculoplasty with partial prosthesis, split thickness skin graft from postauricular skin 1x1cm, and right tragal cartilage graft 1x1 cm [facility])  . right hemicolectomy for diverticulitis with abscess  1993  . ROBOTIC ASSISTED BILATERAL SALPINGO OOPHERECTOMY Bilateral 01/31/2018   All PATH benign.  Procedure: XI ROBOTIC  ASSISTED BILATERAL SALPINGO OOPHORECTOMY;  Surgeon: Everitt Amber, MD;  Location: [facility];  Service: Gynecology;  Laterality: Bilateral;  . TUBAL LIGATION     Past Medical History:  Diagnosis Date  . Allergic rhinitis    maple pollen  . Anxiety   . Arthritis   . Bowel obstruction ([place])   . Chronic low back pain    Dr. Trenton Gammon.  Has had back injection--bp elevated after.  . Chronic pain syndrome   . Chronic renal insufficiency,  stage 2 (mild)    GFR 60-70  . DDD (degenerative disc disease), cervical    Hx of ACDF (Dr. [name]).  Followed by Dr. [name].  Also, Dr. [name] do left C7-T1 intralaminal epidural injection.  . Diverticulosis of colon   . DJD (degenerative joint disease)   . Gallstones   . GERD (gastroesophageal reflux disease)   . Herpes zoster 07/08/2014  . 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 Dense R arm and leg hemiplegia.  Marland Kitchen Hypercholesteremia    mild; pt declined statin trial 05/2014--needs recheck lipid panel at first f/u visit in 2017  . Hypertension   . Osteopenia 06/2017   T score -1.4 FRAX 15% / 2%  . Ovarian cyst 2017   82019-robotic assisted bilatel SPO: all path benign.  . Perianal dermatitis    prn cutivate  . Phlebitis   . 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 Right knee injury 2018   Patellofemoral crush injury--Dr. [name].  . Trochanteric bursitis of right hip    Recurrent (injection by Dr. [name] 05/26/15)  . Tympanic membrane rupture, right 12/01/2016   As of 08/2018 pt set for tympanomastoidectomy and STSG (Dr. [name]). Recurrent fungal/bact OE.  . Varicose vein    left leg    BP 138/65 Comment: phone visit/ last recorded  Pulse 79 Comment: phone visit/ last recorded  Temp 98 F (36.7 C) Comment: phone visit/ last recorded  Resp 17 Comment: phone visit/ last recorded  Ht 5' (1.524 m) Comment:  phone visit/ last recorded  Wt 131 lb 11.2 oz (59.7 kg) Comment: phone visit/ last recorded  LMP 06/27/1972   BMI 25.72 kg/m   Opioid Risk Score:   Fall Risk Score:  `1  Depression screen PHQ 2/9  Depression screen [facility] 2/9 03/04/2020 11/02/2018 01/26/2018 12/19/2017 12/05/2016 08/24/2015  Decreased Interest 2 0 0 0 0 0  Down, Depressed, Hopeless 2 0 0 0 0 0  PHQ - 2 Score 4 0 0 0 0 0  Altered sleeping - 0 - 0 - -  Tired, decreased energy - 0 - 0 - -  Change in appetite - 0 - 0 - -  Feeling bad or failure about yourself  - 0 - 0 - -  Trouble concentrating - 0 - 0 - -  Moving slowly or fidgety/restless - 0 - 0 - -  Suicidal thoughts - 0 - 0 - -  PHQ-9 Score - 0 - 0 - -  Difficult doing work/chores - Not difficult at all - - - -  Some recent data might be hidden      Objective:   Physical Exam n/a       Assessment & Plan:  1. 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             -continue with therapies at SNF to address ROM and functional mobility  -AFO per PT for ambulation  -would like to bring her in for botox RLE, 300u (?RUE)  -neurology f/u as advised 2.  Antithrombotics, antiplatelets:  - None d/t hemorrhage at present--f/u with neurology re: resumption 3. Chronic neck pain/Pain Management: change hydrocodone to prn (1/2 of  5mg  tablet) 4. Mood: prn xanax for anxiety  -seroquel 12.5mg  qhs for sleep, may need to consider dropping this also 5.  HTN:              Verapamil and lisinopril per primary          6. Kleb pneumonia UTI: resolved  -normal habits at present     7. Dysphagia:              -D3/thins continue per pt   8.  History of small volume rectal bleeding likely traumatic due to hard stool             - moving bowels  regularly  -f/u per primary 9.  Otitis externa--managed per ENT   Fifteen minutes of face to face patient care time were spent during this visit. All questions were encouraged and answered.  Follow up with me in 2-4 weeks for botox. Hulen Skains and spoke with daughter as well.

## 2020-03-05 ENCOUNTER — Encounter: Payer: Medicare Other | Admitting: Registered Nurse

## 2020-03-05 ENCOUNTER — Telehealth: Payer: Self-pay

## 2020-03-05 NOTE — Telephone Encounter (Signed)
PTN SON Alison Cuevas ([place]) PHONE [phone] CALLED AND WANTS MOM HERE FOR BOTOX ASAP FEELS THERE WAS A MISCOMMUNICATION AND SHE MISSED OPPORTUNITY AND WAS NEGATIVE BY RAPID TEST-- SCHEDULED FOR 2 WEEKS OUT

## 2020-03-16 DIAGNOSIS — R410 Disorientation, unspecified: Secondary | ICD-10-CM | POA: Diagnosis not present

## 2020-03-16 DIAGNOSIS — N189 Chronic kidney disease, unspecified: Secondary | ICD-10-CM | POA: Diagnosis not present

## 2020-03-16 DIAGNOSIS — H7291 Unspecified perforation of tympanic membrane, right ear: Secondary | ICD-10-CM | POA: Diagnosis not present

## 2020-03-16 DIAGNOSIS — F419 Anxiety disorder, unspecified: Secondary | ICD-10-CM | POA: Diagnosis not present

## 2020-03-18 ENCOUNTER — Encounter: Payer: Self-pay | Admitting: Physical Medicine & Rehabilitation

## 2020-03-18 ENCOUNTER — Other Ambulatory Visit: Payer: Self-pay

## 2020-03-18 ENCOUNTER — Encounter (HOSPITAL_BASED_OUTPATIENT_CLINIC_OR_DEPARTMENT_OTHER): Payer: Medicare Other | Admitting: Physical Medicine & Rehabilitation

## 2020-03-18 VITALS — BP 146/80 | HR 82 | Temp 98.1°F

## 2020-03-18 DIAGNOSIS — G8111 Spastic hemiplegia affecting right dominant side: Secondary | ICD-10-CM

## 2020-03-18 DIAGNOSIS — I619 Nontraumatic intracerebral hemorrhage, unspecified: Secondary | ICD-10-CM | POA: Diagnosis not present

## 2020-03-18 DIAGNOSIS — I610 Nontraumatic intracerebral hemorrhage in hemisphere, subcortical: Secondary | ICD-10-CM | POA: Diagnosis not present

## 2020-03-18 DIAGNOSIS — M797 Fibromyalgia: Secondary | ICD-10-CM | POA: Diagnosis not present

## 2020-03-18 NOTE — Patient Instructions (Signed)
IF YOU END UP GOING HOME BEFORE OUR FOLLOW UP VISIT, CONTACT ME AND I CAN HELP SET UP OUTPATIENT OR HOME THERAPIES.

## 2020-03-18 NOTE — Progress Notes (Signed)
Botox Injection for spasticity of upper extremity using needle EMG guidance Indication: Right spastic hemiparesis (HCC)  Nontraumatic subcortical hemorrhage of left cerebral hemisphere (HCC)   Dilution: 100 Units/ml        Total Units Injected: 300 Indication: Severe spasticity which interferes with ADL,mobility and/or  hygiene and is unresponsive to medication management and other conservative care Informed consent was obtained after describing risks and benefits of the procedure with the patient. This includes bleeding, bruising, infection, excessive weakness, or medication side effects. A REMS form is on file and signed.  Needle: 16mm injectable monopolar needle electrode    Number of units per muscle Pectoralis Major 100 units Pectoralis Minor 0 units Biceps 0 units Brachioradialis 0 units FCR 0 units FCU 0 units FDS 0 units FDP 0 units FPL 0 units Palmaris Longus 0 units Pronator Teres 0 units Pronator Quadratus 0 units Lumbricals 0 units Gastrocs 100 units Tibialis posterior 100 units All injections were done after obtaining appropriate EMG activity and after negative drawback for blood. The patient tolerated the procedure well. Post procedure instructions were given. Return in about 2 months (around 05/18/2020) for REASSESS FOR MORE BOTOX.

## 2020-03-23 DIAGNOSIS — G8929 Other chronic pain: Secondary | ICD-10-CM | POA: Diagnosis not present

## 2020-03-23 DIAGNOSIS — F419 Anxiety disorder, unspecified: Secondary | ICD-10-CM | POA: Diagnosis not present

## 2020-03-23 DIAGNOSIS — I619 Nontraumatic intracerebral hemorrhage, unspecified: Secondary | ICD-10-CM | POA: Diagnosis not present

## 2020-03-23 DIAGNOSIS — R410 Disorientation, unspecified: Secondary | ICD-10-CM | POA: Diagnosis not present

## 2020-03-23 DIAGNOSIS — I10 Essential (primary) hypertension: Secondary | ICD-10-CM | POA: Diagnosis not present

## 2020-03-23 DIAGNOSIS — N189 Chronic kidney disease, unspecified: Secondary | ICD-10-CM | POA: Diagnosis not present

## 2020-03-24 ENCOUNTER — Other Ambulatory Visit: Payer: Self-pay

## 2020-03-24 ENCOUNTER — Encounter: Payer: Self-pay | Admitting: Adult Health

## 2020-03-24 ENCOUNTER — Telehealth: Payer: Self-pay | Admitting: Adult Health

## 2020-03-24 ENCOUNTER — Ambulatory Visit (INDEPENDENT_AMBULATORY_CARE_PROVIDER_SITE_OTHER): Payer: Medicare Other | Admitting: Adult Health

## 2020-03-24 VITALS — BP 139/78 | HR 72 | Ht 60.0 in | Wt 132.2 lb

## 2020-03-24 DIAGNOSIS — G8111 Spastic hemiplegia affecting right dominant side: Secondary | ICD-10-CM | POA: Diagnosis not present

## 2020-03-24 DIAGNOSIS — G43109 Migraine with aura, not intractable, without status migrainosus: Secondary | ICD-10-CM

## 2020-03-24 DIAGNOSIS — R410 Disorientation, unspecified: Secondary | ICD-10-CM | POA: Diagnosis not present

## 2020-03-24 DIAGNOSIS — I10 Essential (primary) hypertension: Secondary | ICD-10-CM

## 2020-03-24 DIAGNOSIS — I610 Nontraumatic intracerebral hemorrhage in hemisphere, subcortical: Secondary | ICD-10-CM

## 2020-03-24 DIAGNOSIS — F4323 Adjustment disorder with mixed anxiety and depressed mood: Secondary | ICD-10-CM | POA: Diagnosis not present

## 2020-03-24 DIAGNOSIS — F419 Anxiety disorder, unspecified: Secondary | ICD-10-CM | POA: Diagnosis not present

## 2020-03-24 DIAGNOSIS — G8929 Other chronic pain: Secondary | ICD-10-CM | POA: Diagnosis not present

## 2020-03-24 NOTE — Patient Instructions (Addendum)
Continue working with therapy for likely ongoing improvement  Repeat MRI brain w/wo contrast for follow-up on recent bleed  Continue to follow up with PCP regarding blood pressure management and maintain strict control of hypertension with blood pressure goal below 130/90      Followup in the future with me in 3 months or call earlier if needed       Thank you for coming to see [place] at [facility]. I hope we have been able to provide you high quality care today.  You may receive a patient satisfaction survey over the next few weeks. We would appreciate your feedback and comments so that we may continue to improve ourselves and the health of our patients.   Hemorrhagic Stroke  A hemorrhagic stroke is the sudden death of brain tissue that occurs when a blood vessel in the brain leaks or bursts (ruptures), causing bleeding in or around the brain(hemorrhage). When this happens, areas of the brain do not get enough oxygen, and blood builds up and presses on areas of the brain. Lack of oxygen and pressure from hemorrhaging can lead to brain damage and death. There are two major types of hemorrhagic stroke:  Intracerebral hemorrhage. This happens if bleeding occurs within the brain tissue.  Subarachnoid hemorrhage. This happens if bleeding occurs in the area between the brain and the membrane that covers the brain (subarachnoid space). Hemorrhagic stroke is a medical emergency. It can cause temporary or permanent brain damage and loss of brain function. What are the causes? This condition is caused by a blood vessel leaking or rupturing, which may be the result of:  Part of a weakened blood vessel wall bulging or ballooning out (cerebral aneurysm).  A hardened, thin blood vessel cracking open and allowing blood to leak out. Blood vessels may become hardened and thin due to plaque buildup.  Tangled blood vessels in the brain (brain arteriovenous  malformation).  Protein buildup in artery walls in the brain (amyloid angiopathy).  Inflamed blood vessels (vasculitis).  A tumor in the brain.  High blood pressure (hypertension). What increases the risk? The following factors may make you more likely to develop this condition:  Hypertension.  Having abnormal blood vessels present since birth (congenital abnormality).  Bleeding disorders, such as hemophilia, sickle cell disease, or liver disease.  The blood becoming too thin while taking blood thinners (anticoagulants).  Aging.  Moderate or heavy alcohol use.  Using drugs, such as cocaine or methamphetamines. What are the signs or symptoms? Symptoms of this condition usually appear suddenly, and may include:  Weakness or numbness of the face, arm, or leg, especially on one side of the body.  Confusion.  Difficulty speaking (aphasia) or understanding speech.  Difficulty seeing out of one or both eyes.  Difficulty walking or moving the arms or legs.  Dizziness.  Loss of balance or coordination.  Seizures.  A severe headache with no known cause. This headache may feel like the worst headache a person has ever experienced. How is this diagnosed? This condition may be diagnosed based on:  Your symptoms.  Your medical history.  A physical exam.  Tests, including: ? Blood tests. ? CT scan. ? MRI. ? CT angiography (CTA) or magnetic resonance angiography (MRA).  Catheter angiogram. In this procedure, dye is injected through a long, thin tube (catheter) into one of your arteries. Then, X-rays are taken. The X-rays will show whether there is a blockage or a problem in a blood vessel. How is this  treated? This condition is a medical emergency that must be treated in a hospital immediately. The goals of treatment are to stop bleeding, reduce pressure on the brain, relieve symptoms, and prevent complications. Treatment for this condition may include:  Medicines  that: ? Lower blood pressure (antihypertensives). ? Relieve pain (analgesics). ? Relieve nausea or vomiting. ? Stop or prevent seizures (anticonvulsants). ? Relieve fever. ? Prevent blood vessels in the brain from spasming in response to bleeding. ? Control bleeding in the brain.  Assisted breathing (ventilation). This involves using a machine to help you breathe (ventilator).  Receiving donated blood products through an IV (transfusion). You will receive cells that help your blood clot.  Placement of a tube (shunt) in the brain to relieve pressure.  Physical, speech, or occupational therapy.  Surgery to stop bleeding, remove a blood clot or tumor, or reduce pressure. Treatment depends on the cause, severity, and duration of symptoms. Medicines and changes to your diet may be used to help treat and manage risk factors for stroke, such as diabetes and high blood pressure. Recovery from hemorrhagic stroke varies widely. Talk with your health care provider about what to expect during your recovery. Follow these instructions at home: Activity  Use a walker or a cane as told by your health care provider.  Return to your normal activities as told by your health care provider. Ask your health care provider what activities are safe for you.  Rest. Rest helps the brain to heal. Make sure you: ? Get plenty of sleep. Avoid staying up late at night. ? Keep a consistent sleep schedule. Try to go to sleep and wake up at about the same time every day. ? Avoid activities that cause physical or mental stress. Lifestyle  Do not drink alcohol if: ? Your health care provider tells you not to drink. ? You are pregnant, may be pregnant, or are planning to become pregnant.  If you drink alcohol: ? Limit how much you use to:  0-1 drink a day for women.  0-2 drinks a day for men. ? Be aware of how much alcohol is in your drink. In the [place] one drink equals one 12 oz bottle of beer (355 mL), one 5  oz glass of wine (148 mL), or one 1 oz glass of hard liquor (44 mL). General instructions  Do not drive or use heavy machinery until your health care provider approves.  Take over-the-counter and prescription medicines only as told by your health care provider.  Keep all follow-up visits as told by your health care provider, including visits with therapists. This is important. How is this prevented? Your risk of stroke can be decreased by working with your health care provider to treat:  High blood pressure.  High cholesterol.  Diabetes.  Heart disease.  Obesity. Your risk of stroke can also be decreased by quitting smoking, limiting alcohol, and staying physically active. If you take the blood thinner warfarin, have your bloodwork monitored frequently by your health care provider. Contact a health care provider if: You develop any of the following symptoms:  Headaches that keep coming back (chronic headaches).  Nausea.  Vision problems.  Increased sensitivity to noise or light.  Depression or mood swings.  Anxiety or irritability.  Memory problems.  Difficulty concentrating or paying attention.  Sleep problems.  Feeling tired all of the time. Get help right away if you:  Have a partial or total loss of consciousness.  Are taking blood thinners and you fall  or you experience minor injury to the head.  Have a bleeding disorder and you fall or you experience minor trauma to the head.  Have any symptoms of a stroke. "BE FAST" is an easy way to remember the main warning signs of a stroke: ? B - Balance. Signs are dizziness, sudden trouble walking, or loss of balance. ? E - Eyes. Signs are trouble seeing or a sudden change in vision. ? F - Face. Signs are sudden weakness or numbness of the face, or the face or eyelid drooping on one side. ? A - Arms. Signs are weakness or numbness in an arm. This happens suddenly and usually on one side of the body. ? S - Speech.  Signs are sudden trouble speaking, slurred speech, or trouble understanding what people say. ? T - Time. Time to call emergency services. Write down what time symptoms started.  Have other signs of a stroke, such as: ? A sudden, severe headache with no known cause. ? Nausea or vomiting. ? Seizure. These symptoms may represent a serious problem that is an emergency. Do not wait to see if the symptoms will go away. Get medical help right away. Call your local emergency services (911 in the [place] Do not drive yourself to the hospital. Summary  Hemorrhagic stroke is a medical emergency.  Hemorrhagic stroke is caused by bleeding in or around the brain.  Know the signs and symptoms of stroke.  Know your stroke risk factors. Work with your health care provider to decrease your risk of stroke. This information is not intended to replace advice given to you by your health care provider. Make sure you discuss any questions you have with your health care provider. Document Revised: 07/19/2018 Document Reviewed: 07/20/2018 Elsevier Patient Education  [place].

## 2020-03-24 NOTE — Telephone Encounter (Signed)
Medicare/aetna order sent to [facility]. No auth they will reach out to the patient to schedule.
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
## 2020-03-25 NOTE — Progress Notes (Signed)
I agree with the above plan 

## 2020-03-26 DIAGNOSIS — F419 Anxiety disorder, unspecified: Secondary | ICD-10-CM | POA: Diagnosis not present

## 2020-03-26 DIAGNOSIS — I619 Nontraumatic intracerebral hemorrhage, unspecified: Secondary | ICD-10-CM | POA: Diagnosis not present

## 2020-03-26 DIAGNOSIS — G8929 Other chronic pain: Secondary | ICD-10-CM | POA: Diagnosis not present

## 2020-03-26 DIAGNOSIS — R410 Disorientation, unspecified: Secondary | ICD-10-CM | POA: Diagnosis not present

## 2020-04-01 ENCOUNTER — Ambulatory Visit: Payer: Medicare Other | Admitting: Family Medicine

## 2020-04-06 ENCOUNTER — Telehealth: Payer: Self-pay

## 2020-04-06 NOTE — Telephone Encounter (Signed)
Judie Petit (daughter) called: Mrs. Ohalloran still has not received a call back to schedule  the MRI of the brain. So I gave her [facility] phone number for follow up.   She also wanted you to know her right knee is pushing out and she now has decreasing control of the right arm.    Judie Petit know you are not in the office. She has been advised to call the PCP for urgent needs. However the concerns will be noted for you review. When you return to the office.

## 2020-04-07 DIAGNOSIS — Z9889 Other specified postprocedural states: Secondary | ICD-10-CM | POA: Diagnosis not present

## 2020-04-07 DIAGNOSIS — H90A31 Mixed conductive and sensorineural hearing loss, unilateral, right ear with restricted hearing on the contralateral side: Secondary | ICD-10-CM | POA: Diagnosis not present

## 2020-04-07 DIAGNOSIS — Z888 Allergy status to other drugs, medicaments and biological substances status: Secondary | ICD-10-CM | POA: Diagnosis not present

## 2020-04-07 DIAGNOSIS — H90A22 Sensorineural hearing loss, unilateral, left ear, with restricted hearing on the contralateral side: Secondary | ICD-10-CM | POA: Diagnosis not present

## 2020-04-07 DIAGNOSIS — H7291 Unspecified perforation of tympanic membrane, right ear: Secondary | ICD-10-CM | POA: Diagnosis not present

## 2020-04-13 ENCOUNTER — Encounter: Payer: Self-pay | Admitting: Adult Health

## 2020-04-13 DIAGNOSIS — G8929 Other chronic pain: Secondary | ICD-10-CM | POA: Diagnosis not present

## 2020-04-13 DIAGNOSIS — M8580 Other specified disorders of bone density and structure, unspecified site: Secondary | ICD-10-CM | POA: Diagnosis not present

## 2020-04-13 DIAGNOSIS — R410 Disorientation, unspecified: Secondary | ICD-10-CM | POA: Diagnosis not present

## 2020-04-13 DIAGNOSIS — J302 Other seasonal allergic rhinitis: Secondary | ICD-10-CM | POA: Diagnosis not present

## 2020-04-13 DIAGNOSIS — I10 Essential (primary) hypertension: Secondary | ICD-10-CM | POA: Diagnosis not present

## 2020-04-13 NOTE — Telephone Encounter (Signed)
[facility] ordered the MRI.   If her daughter feels that she is demonstrating increased right sided weakness, she should be examined by someone asap (if she hasn't already). PCP would be a place to start. Also would discuss with neurology.

## 2020-04-13 NOTE — Telephone Encounter (Signed)
Dr. Naaman Plummer reply left on [place] voicemail.   I also spoke to Juanda Crumble (Son) whom stated Alison Cuevas is in a nursing home. And it will be difficult to get her in to see the PCP. Being that the facility has their own providers.   I advised him to get with [name] and speak with the head nurse about the concerns.

## 2020-04-13 NOTE — Telephone Encounter (Signed)
Scheduled at GI for 04/16/20

## 2020-04-13 NOTE — Telephone Encounter (Signed)
Daughter Alison Cuevas left me two voicemail on my phone wanting to schedule he mom MRI. I called the patient back and left a voicemail that [place] has tried to reach at to her mom twice it could of been a different number they were contacting but I informed her that I would send a message to GI for them to contact her personally.

## 2020-04-13 NOTE — Telephone Encounter (Signed)
Noted, i'll send it to GI to make sure it is safe.

## 2020-04-14 DIAGNOSIS — N189 Chronic kidney disease, unspecified: Secondary | ICD-10-CM | POA: Diagnosis not present

## 2020-04-14 DIAGNOSIS — I619 Nontraumatic intracerebral hemorrhage, unspecified: Secondary | ICD-10-CM | POA: Diagnosis not present

## 2020-04-14 DIAGNOSIS — I10 Essential (primary) hypertension: Secondary | ICD-10-CM | POA: Diagnosis not present

## 2020-04-14 DIAGNOSIS — M1990 Unspecified osteoarthritis, unspecified site: Secondary | ICD-10-CM | POA: Diagnosis not present

## 2020-04-14 NOTE — Telephone Encounter (Signed)
It does not look like she received or required sedation for prior MRI.  I would be hesitant to prescribe this due to possible side effects and no indication for previously requiring.

## 2020-04-15 ENCOUNTER — Telehealth: Payer: Self-pay | Admitting: *Deleted

## 2020-04-15 DIAGNOSIS — M25571 Pain in right ankle and joints of right foot: Secondary | ICD-10-CM | POA: Diagnosis not present

## 2020-04-15 NOTE — Telephone Encounter (Signed)
Call from facility that Alison Cuevas is staying in and they report that she initially had positive results after botox with her foot. It is now wearing off and she is regressing and they would like her to have another injection.  It looks like this was done on 03/18/20 and it would be too early. They said if we need to speak with someone we can call back to [phone] and ask for [name].  PLease advise.

## 2020-04-15 NOTE — Telephone Encounter (Signed)
Unfortunately 11-12 weeks is the earliest we can repeat botox. They'll have to continue working on aggressive ROM, orthotics, etc until then

## 2020-04-15 NOTE — Telephone Encounter (Signed)
Can you please contact the nursing home to further discuss?  She did not previously require sedation for MRI

## 2020-04-16 ENCOUNTER — Ambulatory Visit
Admission: RE | Admit: 2020-04-16 | Discharge: 2020-04-16 | Disposition: A | Discharge status: Home / Self Care | Payer: Medicare Other | Source: Ambulatory Visit | Attending: Adult Health | Admitting: Adult Health

## 2020-04-16 ENCOUNTER — Other Ambulatory Visit: Payer: Medicare Other

## 2020-04-16 DIAGNOSIS — I610 Nontraumatic intracerebral hemorrhage in hemisphere, subcortical: Secondary | ICD-10-CM | POA: Diagnosis not present

## 2020-04-16 MED ORDER — GADOBENATE DIMEGLUMINE 529 MG/ML IV SOLN
13.0000 mL | Freq: Once | INTRAVENOUS | Status: AC | PRN
  Administered 2020-04-16: 13 mL via INTRAVENOUS

## 2020-04-16 NOTE — Telephone Encounter (Signed)
I called and spoke to [place], Therapist, sports at [employer] [facility] [employer].  She stated that pt has good calm days and then bad days.  Today she was calm so far.  I thought her MRI was 04-17-20 but on schedule it is 04-16-20 at 1520.  Lynn Ito gave me fax but was not sure that xanax in [facility]sis.  If comes from the pharmacy would not be able to get in time as leaves today at 1300 for MRI.  I asked if son would be coming and she was not sure.  Do you want to proceed with xanax, would have caregiver with her at all times.

## 2020-04-16 NOTE — Telephone Encounter (Signed)
Thanks, make sure we have [phone] available for her appt.

## 2020-04-16 NOTE — Telephone Encounter (Signed)
noted 

## 2020-04-16 NOTE — Telephone Encounter (Signed)
I have let the facility know that she cannot have the botox before 06/09/20 (84 days) and they are giving the message from Dr Naaman Plummer to [facility].  She has an appt 05/27/20 to reassess need for botox, but that is too early.  I will have Renae reschedule the appt to after 06/09/20.

## 2020-04-17 ENCOUNTER — Encounter: Payer: Self-pay | Admitting: Adult Health

## 2020-04-20 ENCOUNTER — Other Ambulatory Visit: Payer: Self-pay | Admitting: Adult Health

## 2020-04-20 DIAGNOSIS — F419 Anxiety disorder, unspecified: Secondary | ICD-10-CM | POA: Diagnosis not present

## 2020-04-20 DIAGNOSIS — I10 Essential (primary) hypertension: Secondary | ICD-10-CM | POA: Diagnosis not present

## 2020-04-20 DIAGNOSIS — N189 Chronic kidney disease, unspecified: Secondary | ICD-10-CM | POA: Diagnosis not present

## 2020-04-20 DIAGNOSIS — I619 Nontraumatic intracerebral hemorrhage, unspecified: Secondary | ICD-10-CM | POA: Diagnosis not present

## 2020-04-20 DIAGNOSIS — R410 Disorientation, unspecified: Secondary | ICD-10-CM | POA: Diagnosis not present

## 2020-04-20 MED ORDER — ASPIRIN EC 81 MG PO TBEC
81.0000 mg | DELAYED_RELEASE_TABLET | Freq: Every day | ORAL | 11 refills | Status: DC
Start: 2020-04-20 — End: 2023-06-16

## 2020-04-21 ENCOUNTER — Telehealth: Payer: Self-pay | Admitting: *Deleted

## 2020-04-21 NOTE — Telephone Encounter (Signed)
Updated med list when received [facility] list.

## 2020-04-23 DIAGNOSIS — R410 Disorientation, unspecified: Secondary | ICD-10-CM | POA: Diagnosis not present

## 2020-04-23 DIAGNOSIS — I10 Essential (primary) hypertension: Secondary | ICD-10-CM | POA: Diagnosis not present

## 2020-04-23 DIAGNOSIS — M8580 Other specified disorders of bone density and structure, unspecified site: Secondary | ICD-10-CM | POA: Diagnosis not present

## 2020-04-23 DIAGNOSIS — K219 Gastro-esophageal reflux disease without esophagitis: Secondary | ICD-10-CM | POA: Diagnosis not present

## 2020-04-24 ENCOUNTER — Other Ambulatory Visit: Payer: Self-pay

## 2020-04-27 ENCOUNTER — Encounter: Payer: Self-pay | Admitting: Family Medicine

## 2020-04-27 ENCOUNTER — Ambulatory Visit (INDEPENDENT_AMBULATORY_CARE_PROVIDER_SITE_OTHER): Payer: Medicare Other | Admitting: Family Medicine

## 2020-04-27 ENCOUNTER — Other Ambulatory Visit: Payer: Self-pay

## 2020-04-27 VITALS — BP 137/76 | HR 90 | Temp 97.7°F | Resp 16 | Ht 60.0 in

## 2020-04-27 DIAGNOSIS — G8191 Hemiplegia, unspecified affecting right dominant side: Secondary | ICD-10-CM | POA: Diagnosis not present

## 2020-04-27 DIAGNOSIS — M24561 Contracture, right knee: Secondary | ICD-10-CM

## 2020-04-27 DIAGNOSIS — I619 Nontraumatic intracerebral hemorrhage, unspecified: Secondary | ICD-10-CM | POA: Diagnosis not present

## 2020-04-27 NOTE — Progress Notes (Signed)
OFFICE VISIT  04/27/2020  CC:  Chief Complaint  Patient presents with  . Leg inverting    right, occurred from stroke.    HPI:    Patient is a 74 y.o. Caucasian female who presents accompanied by her son for right leg complaint. Since I saw her last, she unfortunately has had acute hemorr CVA--hypertensive-- leaving her with residual R UE and LE weakness. Hosp 8/1-8/4, then rehab 8/4-8/25/21.  She is on ASA 81mg  qd (recently restarted about 10 d/a after her repeat MRI showed signif imp in hematoma). She currently resides at [facility] but will be going home in < 1 wk.  C/o R foot turning inward. Ever since her CVA 3 mo ago, gradually worsening, has been wearing a brace/AFO.  Hurts a lot when walking, otherwise no pain. In-home nursing and PT being arranged, self propelled WC being ordered as well. She can ambulate some with walker but only with an attendant present. She can operate a manual WC on flat, smooth surface.  MRI brain w and w/out contrast 04/16/20: IMPRESSION: Abnormal MRI scan of the brain with and without contrast showing expected evolutionary changes in the subacute left parietal subcortical hematoma.  There are mild changes of chronic small vessel disease.  Compared to previous MRI from 01/27/2020 there is expected improvement.  ROS: no fevers, no CP, no SOB, no wheezing, no cough, no dizziness, no HAs, no rashes, no melena/hematochezia.  No polyuria or polydipsia.  No myalgias or arthralgias.  No tremors. No acute vision or hearing abnormalities. No n/v/d or abd pain.  No palpitations.     Past Medical History:  Diagnosis Date  . Allergic rhinitis    maple pollen  . Anxiety   . Arthritis   . Bowel obstruction ([place])   . Chronic low back pain    Dr. Trenton Gammon.  Has had back injection--bp elevated after.  . Chronic pain syndrome   . Chronic renal insufficiency, stage 2 (mild)    GFR 60-70  . DDD (degenerative disc disease), cervical    Hx of ACDF (Dr. [name]).   Followed by Dr. [name].  Also, Dr. [name] do left C7-T1 intralaminal epidural injection.  . Diverticulosis of colon   . DJD (degenerative joint disease)   . Gallstones   . GERD (gastroesophageal reflux disease)   . Herpes zoster 07/08/2014  . 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 Dense R arm and leg hemiplegia.  Marland Kitchen Hypercholesteremia    mild; pt declined statin trial 05/2014--needs recheck lipid panel at first f/u visit in 2017  . Hypertension   . Osteopenia 06/2017   T score -1.4 FRAX 15% / 2%  . Ovarian cyst 2017   82019-robotic assisted bilatel SPO: all path benign.  . Perianal dermatitis    prn cutivate  . Phlebitis   . Right hemiplegia ([place]) 01/2020   R arm and leg (hemorrhagic L cerebral hemisphere CVA)  . Right knee injury 2018   Patellofemoral crush injury--Dr. [name].  . Trochanteric bursitis of right hip    Recurrent (injection by Dr. [name] 05/26/15)  . Tympanic membrane rupture, right 12/01/2016   As of 08/2018 pt set for tympanomastoidectomy and STSG (Dr. [name]). Recurrent fungal/bact OE.  . Varicose vein    left leg     Past Surgical History:  Procedure Laterality Date  . ABDOMINAL HYSTERECTOMY  03/2015   TAH.  Last pap 2016.  No hx of abnl paps.  Per GYN, no further pap smears indicated.  . ANTERIOR AND POSTERIOR REPAIR N/A 03/18/2014   Procedure: Cystocele repair with graft, Vault suspension, Rectocele repair;  Surgeon: [name], MD;  Location: [facility];  Service: Urology;  Laterality: N/A;  . CHOLECYSTECTOMY    . CHOLECYSTECTOMY, LAPAROSCOPIC    . COLON SURGERY    . COLONOSCOPY  04/2006; 05/26/16   2007 (Dr. [name]): Normal.  04/2016 (Dr. [name]) normal except diverticulosis and decreased anal sphincter tone.  No repeat colonoscopy is recommended due to age.  . cspine surgery     Dr. Wiliam Ke level ant cerv discectomy /fusion w/plating  . CYSTO  N/A 03/18/2014   Procedure: CYSTO;  Surgeon: [name], MD;  Location: [facility];  Service: Urology;  Laterality: N/A;  . DEXA  07/30/2015; 06/2018   2017 and 2020 -->Osteopenia--repeat 2 yrs.  Marland Kitchen LYSIS OF ADHESION N/A 01/31/2018   Procedure: POSSIBLE LYSIS OF ADHESION;  Surgeon: Everitt Amber, MD;  Location: [facility];  Service: Gynecology;  Laterality: N/A;  . [place]     with prolasped bledder repair  . RESECTION OF COLON     BENIGN TUMOR  . RIGHT EAR SURGERY  08/23/2017   Dr. 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 right ear surgery  12/12/2018   Right tympanomastoidectomy, ossiculoplasty with partial prosthesis, split thickness skin graft from postauricular skin 1x1cm, and right tragal cartilage graft 1x1 cm [facility])  . right hemicolectomy for diverticulitis with abscess  1993  . ROBOTIC ASSISTED BILATERAL SALPINGO OOPHERECTOMY Bilateral 01/31/2018   All PATH benign.  Procedure: XI ROBOTIC ASSISTED BILATERAL SALPINGO OOPHORECTOMY;  Surgeon: Everitt Amber, MD;  Location: [facility];  Service: Gynecology;  Laterality: Bilateral;  . TUBAL LIGATION      Outpatient Medications Prior to Visit  Medication Sig Dispense Refill  . acetaminophen (TYLENOL) 500 MG tablet Take 1 tablet (500 mg total) by mouth 3 (three) times daily. 30 tablet 0  . ALPRAZolam (XANAX) 0.5 MG tablet alprazolam 0.5 mg tablet    . Biotin 5 MG TABS Take 5 mg by mouth daily.    . Carboxymethylcellul-Glycerin (LUBRICATING EYE DROPS OP) Place 1 drop into both eyes daily as needed (dry eyes).    . fluticasone (FLONASE) 50 MCG/ACT nasal spray Place 2 sprays into both nostrils at bedtime. 48 g 3  . HYDROcodone-acetaminophen (NORCO/VICODIN) 5-325 MG tablet Take 0.5 tablets by mouth at bedtime. 3 tablet 0  . lidocaine (LIDODERM) 5 % Place 1 patch onto the skin daily as needed. Remove & Discard  patch within 12 hours or as directed by MD 30 patch 0  . lisinopril (ZESTRIL) 5 MG tablet Take 1 tablet (5 mg total) by mouth daily.    Marland Kitchen loratadine (CLARITIN) 10 MG tablet Take 10 mg by mouth every other day.     . meclizine (ANTIVERT) 12.5 MG tablet Take 1 tablet (12.5 mg total) by mouth 3 (three) times daily. (Patient taking differently: Take 12.5 mg by mouth 3 (three) times daily as needed. ) 30 tablet 0  . meloxicam (MOBIC) 15 MG tablet Take 15 mg by mouth daily as needed for pain.    . Multiple Minerals-Vitamins (CALCIUM-MAGNESIUM-ZINC-D3 PO) Take 1 tablet by mouth daily.     . pantoprazole (PROTONIX) 40 MG tablet Take 1 tablet (40 mg total) by mouth at bedtime.    . polyethylene glycol (  MIRALAX / GLYCOLAX) 17 g packet Take 17 g by mouth daily as needed.    . potassium chloride SA (KLOR-CON) 20 MEQ tablet Take 1 tablet (20 mEq total) by mouth daily.    Marland Kitchen tiZANidine (ZANAFLEX) 2 MG tablet Take by mouth at bedtime.    . verapamil (CALAN-SR) 180 MG CR tablet Take 1 tablet (180 mg total) by mouth daily.    Marland Kitchen amoxicillin (AMOXIL) 500 MG capsule amoxicillin 500 mg capsule (Patient not taking: Reported on 04/27/2020)    . aspirin EC 81 MG tablet Take 1 tablet (81 mg total) by mouth daily. Swallow whole. (Patient not taking: Reported on 04/27/2020) 30 tablet 11  . ciprofloxacin-dexamethasone (CIPRODEX) OTIC suspension ciprofloxacin 0.3 %-dexamethasone 0.1 % ear drops,suspension (Patient not taking: Reported on 04/27/2020)    . Sodium Fluoride (CLINPRO 5000) 1.1 % PSTE Clinpro 5000 1.1 % dental paste (Patient not taking: Reported on 04/27/2020)    . triamcinolone ointment (KENALOG) 0.1 % triamcinolone acetonide 0.1 % topical ointment (Patient not taking: Reported on 04/27/2020)    . sulfamethoxazole-trimethoprim (BACTRIM DS) 800-160 MG tablet sulfamethoxazole 800 mg-trimethoprim 160 mg tablet (Patient not taking: Reported on 04/27/2020)    . terconazole (TERAZOL 7) 0.4 % vaginal cream terconazole 0.4 %  vaginal cream (Patient not taking: Reported on 04/27/2020)     No facility-administered medications prior to visit.    Allergies  Allergen Reactions  . Gabapentin Anxiety    Elevated heart rate and crazy dreams  . Prednisone Anxiety and Other (See Comments)    REACTION: funny feeling    ROS As per HPI  PE: Vitals with BMI 04/27/2020 03/24/2020 03/18/2020  Height 5\' 0"  5\' 0"  (No Data)  Weight - 132 lbs 3 oz (No Data)  BMI - 61.60 -  Systolic 737 106 269  Diastolic 76 78 80  Pulse 90 72 82   Gen: Alert, well appearing.  Patient is oriented to person, place, time, and situation. AFFECT: pleasant, lucid thought and speech. SWN:IOEV: no injection, icteris, swelling, or exudate.  EOMI, PERRLA. Mouth: lips without lesion/swelling.  Oral mucosa pink and moist. Oropharynx without erythema, exudate, or swelling.  CV: RRR, no m/r/g.   LUNGS: CTA bilat, nonlabored resps, good aeration in all lung fields. EXT: no clubbing or cyanosis.  no edema.  Wearing AFO on R LL. Neuro: CN 2-12 intact bilaterally, strength 5/5 in proximal and distal L upper extremity, 5-/5 R UA dist and prox.  Legs with equal strength except R ankle dorsiflexion and plantar flexion mild weakness, with obvious R ankle flexion and inversion deformity.  CAn barely wiggle toes on R foot.  She is unable to extend R foot at all.  No resting tremor.  She has uncoordinated FNF testing with R arm, normal with L.   Sitting WC.  I did not have her get up and walk today.    LABS:    Chemistry      Component Value Date/Time   NA 135 02/17/2020 0542   K 4.8 02/17/2020 0542   CL 104 02/17/2020 0542   CO2 17 (L) 02/17/2020 0542   BUN 11 02/17/2020 0542   CREATININE 0.81 02/17/2020 0542   CREATININE 0.82 12/19/2017 1207      Component Value Date/Time   CALCIUM 9.3 02/17/2020 0542   ALKPHOS 60 01/30/2020 0526   AST 31 01/30/2020 0526   ALT 33 01/30/2020 0526   BILITOT 1.2 01/30/2020 0526     Lab Results  Component Value Date  WBC 5.3 02/17/2020   HGB 12.5 02/17/2020   HCT 37.0 02/17/2020   MCV 91.6 02/17/2020   PLT 189 02/17/2020   Lab Results  Component Value Date   HGBA1C 5.0 01/28/2020   Lab Results  Component Value Date   CHOL 163 01/28/2020   HDL 75 01/28/2020   LDLCALC 81 01/28/2020   LDLDIRECT 136.9 09/20/2007   TRIG 33 01/28/2020   CHOLHDL 2.2 01/28/2020   IMPRESSION AND PLAN:  1) 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 This is from her recent CVA and is being managed with AFO/bracing by her PT. SHe'll continue to do this and when she goes home from ALF she will restart her vicodin on a prn basis---they are only able to do scheduled dosing at ALF and this is not appropriate for her.  2) Acute L hemispheric hemorrhagic CVA: some residual R UE and LE weakness and flexion contractures.  She has received botox inj in R upper and R lower extremities by Phys med and rehab MD.  Pt says hard to tell if this has helped or not. Continue with phys med and rehab tx's, PT, OT. She'll be returning home from ALF in < 1 wk. It is being arranged by NH at this time for her to get appropriate walker and WC. Her bp is adequately controlled on lisinopril 5mg  qd and calan sr 180mg  qd.  An After Visit Summary was printed and given to the patient.  FOLLOW UP: Return in about 4 weeks (around 05/25/2020) for routine chronic illness f/u.  Signed:  Crissie Sickles, MD           04/27/2020

## 2020-04-29 DIAGNOSIS — G8929 Other chronic pain: Secondary | ICD-10-CM | POA: Diagnosis not present

## 2020-04-29 DIAGNOSIS — F4323 Adjustment disorder with mixed anxiety and depressed mood: Secondary | ICD-10-CM | POA: Diagnosis not present

## 2020-04-29 DIAGNOSIS — F419 Anxiety disorder, unspecified: Secondary | ICD-10-CM | POA: Diagnosis not present

## 2020-04-30 DIAGNOSIS — F419 Anxiety disorder, unspecified: Secondary | ICD-10-CM | POA: Diagnosis not present

## 2020-04-30 DIAGNOSIS — I10 Essential (primary) hypertension: Secondary | ICD-10-CM | POA: Diagnosis not present

## 2020-04-30 DIAGNOSIS — M1990 Unspecified osteoarthritis, unspecified site: Secondary | ICD-10-CM | POA: Diagnosis not present

## 2020-04-30 DIAGNOSIS — G8929 Other chronic pain: Secondary | ICD-10-CM | POA: Diagnosis not present

## 2020-05-04 DIAGNOSIS — F4323 Adjustment disorder with mixed anxiety and depressed mood: Secondary | ICD-10-CM | POA: Diagnosis not present

## 2020-05-04 DIAGNOSIS — Z9181 History of falling: Secondary | ICD-10-CM | POA: Diagnosis not present

## 2020-05-04 DIAGNOSIS — M21371 Foot drop, right foot: Secondary | ICD-10-CM | POA: Diagnosis not present

## 2020-05-04 DIAGNOSIS — I129 Hypertensive chronic kidney disease with stage 1 through stage 4 chronic kidney disease, or unspecified chronic kidney disease: Secondary | ICD-10-CM | POA: Diagnosis not present

## 2020-05-04 DIAGNOSIS — I69153 Hemiplegia and hemiparesis following nontraumatic intracerebral hemorrhage affecting right non-dominant side: Secondary | ICD-10-CM | POA: Diagnosis not present

## 2020-05-04 DIAGNOSIS — K219 Gastro-esophageal reflux disease without esophagitis: Secondary | ICD-10-CM | POA: Diagnosis not present

## 2020-05-04 DIAGNOSIS — K5790 Diverticulosis of intestine, part unspecified, without perforation or abscess without bleeding: Secondary | ICD-10-CM | POA: Diagnosis not present

## 2020-05-04 DIAGNOSIS — Z87440 Personal history of urinary (tract) infections: Secondary | ICD-10-CM | POA: Diagnosis not present

## 2020-05-04 DIAGNOSIS — N189 Chronic kidney disease, unspecified: Secondary | ICD-10-CM | POA: Diagnosis not present

## 2020-05-04 DIAGNOSIS — G894 Chronic pain syndrome: Secondary | ICD-10-CM | POA: Diagnosis not present

## 2020-05-04 DIAGNOSIS — K5900 Constipation, unspecified: Secondary | ICD-10-CM | POA: Diagnosis not present

## 2020-05-04 DIAGNOSIS — M1990 Unspecified osteoarthritis, unspecified site: Secondary | ICD-10-CM | POA: Diagnosis not present

## 2020-05-04 DIAGNOSIS — I69151 Hemiplegia and hemiparesis following nontraumatic intracerebral hemorrhage affecting right dominant side: Secondary | ICD-10-CM | POA: Diagnosis not present

## 2020-05-04 DIAGNOSIS — Z7982 Long term (current) use of aspirin: Secondary | ICD-10-CM | POA: Diagnosis not present

## 2020-05-04 DIAGNOSIS — M7061 Trochanteric bursitis, right hip: Secondary | ICD-10-CM | POA: Diagnosis not present

## 2020-05-04 DIAGNOSIS — J309 Allergic rhinitis, unspecified: Secondary | ICD-10-CM | POA: Diagnosis not present

## 2020-05-04 DIAGNOSIS — E785 Hyperlipidemia, unspecified: Secondary | ICD-10-CM | POA: Diagnosis not present

## 2020-05-04 DIAGNOSIS — G4700 Insomnia, unspecified: Secondary | ICD-10-CM | POA: Diagnosis not present

## 2020-05-04 DIAGNOSIS — M797 Fibromyalgia: Secondary | ICD-10-CM | POA: Diagnosis not present

## 2020-05-04 DIAGNOSIS — M8580 Other specified disorders of bone density and structure, unspecified site: Secondary | ICD-10-CM | POA: Diagnosis not present

## 2020-05-04 DIAGNOSIS — M5030 Other cervical disc degeneration, unspecified cervical region: Secondary | ICD-10-CM | POA: Diagnosis not present

## 2020-05-05 ENCOUNTER — Encounter: Payer: Self-pay | Admitting: Family Medicine

## 2020-05-06 MED ORDER — MELOXICAM 15 MG PO TABS: 15.0000 mg | ORAL_TABLET | Freq: Every day | ORAL | 1 refills | Status: DC | PRN

## 2020-05-06 MED ORDER — PANTOPRAZOLE SODIUM 40 MG PO TBEC: 40.0000 mg | DELAYED_RELEASE_TABLET | Freq: Every day | ORAL | 3 refills | Status: DC

## 2020-05-06 MED ORDER — TIZANIDINE HCL 2 MG PO TABS: ORAL_TABLET | ORAL | 3 refills | Status: DC

## 2020-05-06 MED ORDER — VERAPAMIL HCL ER 180 MG PO TBCR
180.0000 mg | EXTENDED_RELEASE_TABLET | Freq: Every day | ORAL | 3 refills | Status: DC
Start: 2020-05-06 — End: 2021-07-27

## 2020-05-06 MED ORDER — LISINOPRIL 5 MG PO TABS: 5.0000 mg | ORAL_TABLET | Freq: Every day | ORAL | 3 refills | Status: DC

## 2020-05-06 NOTE — Telephone Encounter (Signed)
Please advise, thanks. Med list printed and placed on PCP desk.

## 2020-05-06 NOTE — Telephone Encounter (Signed)
OK. Meloxicam, tizanadine, pantoprazole, lisinopril, and verapamil ALL erx'd to [facility] just now.

## 2020-05-07 ENCOUNTER — Telehealth: Payer: Self-pay | Admitting: Family Medicine

## 2020-05-07 NOTE — Telephone Encounter (Signed)
OK for [place] orders. Meds noted and no new recommendations/no changes.-thx

## 2020-05-07 NOTE — Telephone Encounter (Signed)
Alison Cuevas with [facility] would like verbal order for Home Health/OT as follows:  One weekly x 1 week, twice weekly x 1, then once weekly x 1 week, then twice weekly x 3 weeks.  Also, Alison Cuevas would like Dr. Anitra Lauth to be aware patient bad additional medications in home as follows: fluticasone nasal spray, hydrocodone 5-325 as needed which shows an interaction with alprazolam, tizanidine, cerapanil, meclizine 12.5mg  as needed tid, meloxicam 15mg  once daily as needed which shows an interaction with lisonopril.  Her call back number is [phone].

## 2020-05-07 NOTE — Telephone Encounter (Signed)
V/o given to [facility] regarding [place] orders

## 2020-05-07 NOTE — Telephone Encounter (Signed)
Please advise for v/o

## 2020-05-12 ENCOUNTER — Telehealth: Payer: Self-pay

## 2020-05-12 NOTE — Telephone Encounter (Signed)
Received drug interaction report regarding patient. Placed on PCP desk to review and sign, if appropriate.

## 2020-05-13 ENCOUNTER — Telehealth: Payer: Self-pay

## 2020-05-13 NOTE — Telephone Encounter (Signed)
HHorders/POC received on 05/13/20 from [facility]. Pt last seen 04/27/20. Placed on desk for review and signature.

## 2020-05-20 ENCOUNTER — Encounter: Payer: Self-pay | Admitting: Family Medicine

## 2020-05-20 ENCOUNTER — Ambulatory Visit: Payer: Medicare Other | Admitting: Physical Medicine & Rehabilitation

## 2020-05-20 MED ORDER — POTASSIUM CHLORIDE CRYS ER 20 MEQ PO TBCR
20.0000 meq | EXTENDED_RELEASE_TABLET | Freq: Every day | ORAL | 3 refills | Status: DC
Start: 2020-05-20 — End: 2021-04-23

## 2020-05-20 NOTE — Telephone Encounter (Signed)
Signed and put in box to go up front.  Signed:  Crissie Sickles, MD           05/20/2020

## 2020-05-20 NOTE — Telephone Encounter (Signed)
Continue prescription for potassium. New rx for potassium sent to [facility].

## 2020-05-20 NOTE — Telephone Encounter (Signed)
Home health orders received 05/19/20 for [facility] health initiation orders: Yes.  Home health re-certification orders: No. Patient last seen by ordering physician for this condition: 04/27/20. Must be less than 90 days for re-certification and less than 30 days prior for initiation. Visit must have been for the condition the orders are being placed.  Patient meets criteria for Physician to sign orders: Yes.        Current med list has been attached: Yes        Orders placed on physicians desk for signature: 05/20/20 (date) If patient does not meet criteria for orders to be signed: pt was called to schedule appt. Appt is scheduled for n/a.   [place]

## 2020-05-20 NOTE — Telephone Encounter (Signed)
Please advise, potassium if from different provider

## 2020-05-25 ENCOUNTER — Encounter: Payer: Self-pay | Admitting: Family Medicine

## 2020-05-25 ENCOUNTER — Ambulatory Visit (INDEPENDENT_AMBULATORY_CARE_PROVIDER_SITE_OTHER): Payer: Medicare Other | Admitting: Family Medicine

## 2020-05-25 ENCOUNTER — Encounter: Payer: Self-pay | Admitting: Adult Health

## 2020-05-25 ENCOUNTER — Other Ambulatory Visit: Payer: Self-pay

## 2020-05-25 VITALS — BP 116/80 | HR 84 | Temp 98.0°F | Resp 16 | Ht 60.0 in

## 2020-05-25 DIAGNOSIS — G44211 Episodic tension-type headache, intractable: Secondary | ICD-10-CM | POA: Diagnosis not present

## 2020-05-25 DIAGNOSIS — R350 Frequency of micturition: Secondary | ICD-10-CM

## 2020-05-25 DIAGNOSIS — N390 Urinary tract infection, site not specified: Secondary | ICD-10-CM

## 2020-05-25 DIAGNOSIS — I10 Essential (primary) hypertension: Secondary | ICD-10-CM

## 2020-05-25 DIAGNOSIS — B3731 Acute candidiasis of vulva and vagina: Secondary | ICD-10-CM

## 2020-05-25 DIAGNOSIS — I619 Nontraumatic intracerebral hemorrhage, unspecified: Secondary | ICD-10-CM

## 2020-05-25 DIAGNOSIS — B373 Candidiasis of vulva and vagina: Secondary | ICD-10-CM

## 2020-05-25 MED ORDER — FLUCONAZOLE 150 MG PO TABS: 150.0000 mg | ORAL_TABLET | Freq: Once | ORAL | 2 refills | Status: AC

## 2020-05-25 NOTE — Progress Notes (Signed)
OFFICE VISIT  05/25/2020  CC:  Chief Complaint  Patient presents with   Follow-up    RCI, pt is not fasting    HPI:    Patient is a 74 y.o. Caucasian female who presents accompanied by her son for 1 mo f/u recent hemorrhagic CVA with residual R UE and LE weakness flexion contractures. A/P as of last visit: "1) 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 This is from her recent CVA and is being managed with AFO/bracing by her PT. SHe'll continue to do this and when she goes home from ALF she will restart her vicodin on a prn basis---they are only able to do scheduled dosing at ALF and this is not appropriate for her.  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  She has received botox inj in R upper and R lower extremities by Phys med and rehab MD.  Pt says hard to tell if this has helped or not. Continue with phys med and rehab tx's, PT, OT. She'll be returning home from ALF in < 1 wk. It is being arranged by NH at this time for her to get appropriate walker and WC. Her bp is adequately controlled on lisinopril 5mg  qd and calan sr 180mg  qd."  INTERIM HX: Doing pretty well. Takes meloxicam every day.  Occ use of vicodin. R leg pain in ankle/foot + contracture still bothering her a lot, some good days and some bad days.  PT and OT still going to her home 2-3 times a week. Says tizanadine makes her too drowsy to take in daytime.  She takes it with her alpraz hs and thinks it does help some for her contracture pain in R arm and leg.  HOme bp's have been normal. Urinary frequency/urgency last 2d or so, +hx of UTIs.   Also itching in vaginal area lately.  Took a diflucan she had at home and says all itching and urinary complaints have nearly resolved.    HA 6/10 intensity across frontal area last several days, no new weakness, no dysarthria, no swallowing probs, no vision c/o.  No nasal cong.  No  nausea.  Is resistant, as usual, to take a vicodin for this.  She denies any signif prob with ear pain lately, no ear drainage.  No nasal cong/drainage, no cough, no ST. PMP AWARE reviewed today: most recent rx for alpraz 0.5mg  was filled 04/22/20, # 90, rx by [facility], [address] in [place], [place].  Her vicodin is managed by pain mgmt MD. No red flags.  ROS: no fevers, no CP, no SOB, no wheezing, no cough, brief periods of lightheaded/disequilibrium is chronic symptom for her. no melena/hematochezia.  No polyuria or polydipsia.  No myalgias or arthralgias.  No paresthesias or tremors.  No acute vision or hearing abnormalities. No n/v/d or abd pain.  No palpitations.    Past Medical History:  Diagnosis Date   Allergic rhinitis    maple pollen   Anxiety    Arthritis    Bowel obstruction (HCC)    Chronic low back pain    Dr. Trenton Gammon.  Has had back injection--bp elevated after.   Chronic pain syndrome    Chronic renal insufficiency, stage 2 (mild)    GFR 60-70   DDD (degenerative disc disease), cervical    Hx of ACDF (Dr. [name]).  Followed by Dr. [name].  Also, Dr. [name] do left C7-T1 intralaminal epidural  injection.   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  Resid R arm and leg weakness + flexion contractures.   Hypercholesteremia    mild; pt declined statin trial 05/2014--needs recheck lipid panel at first f/u visit in 2017   Hypertension    Osteopenia 06/2017   T score -1.4 FRAX 15% / 2%   Ovarian cyst 2017   [id number]-robotic assisted bilatel SPO: all path benign.   Perianal dermatitis    prn cutivate   Phlebitis    Right hemiplegia ([place]) 01/2020   R arm and leg (hemorrhagic L cerebral hemisphere CVA)    Right knee injury 2018   Patellofemoral crush injury--Dr. [name].   Trochanteric bursitis of right hip    Recurrent (injection by Dr. 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 [name]). Recurrent fungal/bact OE.   Varicose vein    left leg     Past Surgical History:  Procedure Laterality Date   ABDOMINAL HYSTERECTOMY  03/2015   TAH.  Last pap 2016.  No hx of abnl paps.  Per GYN, no further pap smears indicated.   ANTERIOR AND POSTERIOR REPAIR N/A 03/18/2014   Procedure: Cystocele repair with graft, Vault suspension, Rectocele repair;  Surgeon: [name], MD;  Location: [facility];  Service: Urology;  Laterality: N/A;   CHOLECYSTECTOMY     CHOLECYSTECTOMY, LAPAROSCOPIC     COLON SURGERY     COLONOSCOPY  04/2006; 05/26/16   2007 (Dr. [name]): Normal.  04/2016 (Dr. [name]) normal except diverticulosis and decreased anal sphincter tone.  No repeat colonoscopy is recommended due to age.   cspine surgery     Dr. Wiliam Ke level ant cerv discectomy [name] w/plating   CYSTO N/A 03/18/2014   Procedure: CYSTO;  Surgeon: [name], MD;  Location: [facility];  Service: Urology;  Laterality: N/A;   DEXA  07/30/2015; 06/2018   2017 and 2020 -->Osteopenia--repeat 2 yrs.   LYSIS OF ADHESION N/A 01/31/2018   Procedure: POSSIBLE LYSIS OF ADHESION;  Surgeon: Everitt Amber, MD;  Location: [facility];  Service: Gynecology;  Laterality: N/A;   [place]     with prolasped bledder repair   RESECTION OF COLON     BENIGN TUMOR   RIGHT EAR SURGERY  08/23/2017   Dr. 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  Procedure: XI ROBOTIC ASSISTED BILATERAL SALPINGO OOPHORECTOMY;  Surgeon: Everitt Amber, MD;  Location: [facility];  Service: Gynecology;  Laterality: Bilateral;   TRANSTHORACIC ECHOCARDIOGRAM  01/27/2020   EF 65-70%, NORMAL.   TUBAL LIGATION      Outpatient Medications Prior to Visit  Medication Sig Dispense Refill   acetaminophen (TYLENOL) 500 MG tablet Take 1 tablet (500 mg total) by mouth 3 (three) times daily. 30 tablet  0   ALPRAZolam (XANAX) 0.5 MG tablet alprazolam 0.5 mg tablet     Biotin 5 MG TABS Take 5 mg by mouth daily.     Carboxymethylcellul-Glycerin (LUBRICATING EYE DROPS OP) Place 1 drop into both eyes daily as needed (dry eyes).     fluticasone (FLONASE) 50 MCG/ACT nasal spray Place 2 sprays into both nostrils at bedtime. 48 g 3   HYDROcodone-acetaminophen (NORCO/VICODIN) 5-325 MG tablet Take 0.5 tablets by mouth at bedtime. 3 tablet 0   lidocaine (LIDODERM) 5 % Place 1 patch onto the skin daily as needed. Remove & Discard patch within 12 hours or as directed by MD 30 patch 0   lisinopril (ZESTRIL) 5 MG tablet Take 1 tablet (5 mg total) by mouth daily. 90 tablet 3   loratadine (CLARITIN) 10 MG tablet Take 10 mg by mouth every other day.      meclizine (ANTIVERT) 12.5 MG tablet Take 1 tablet (12.5 mg total) by mouth 3 (three) times daily. (Patient taking differently: Take 12.5 mg by mouth 3 (three) times daily as needed. ) 30 tablet 0   meloxicam (MOBIC) 15 MG tablet Take 1 tablet (15 mg total) by mouth daily as needed for pain. 90 tablet 1   Multiple Minerals-Vitamins (CALCIUM-MAGNESIUM-ZINC-D3 PO) Take 1 tablet by mouth daily.      pantoprazole (PROTONIX) 40 MG tablet Take 1 tablet (40 mg total) by mouth at bedtime. 90 tablet 3   polyethylene glycol (MIRALAX / GLYCOLAX) 17  g packet Take 17 g by mouth daily as needed.     potassium chloride SA (KLOR-CON) 20 MEQ tablet Take 1 tablet (20 mEq total) by mouth daily. 90 tablet 3   tiZANidine (ZANAFLEX) 2 MG tablet 1 tab po tid prn for muscle relaxation 90 tablet 3   verapamil (CALAN-SR) 180 MG CR tablet Take 1 tablet (180 mg total) by mouth daily. 90 tablet 3   aspirin EC 81 MG tablet Take 1 tablet (81 mg total) by mouth daily. Swallow whole. (Patient not taking: Reported on 04/27/2020) 30 tablet 11   ciprofloxacin-dexamethasone (CIPRODEX) OTIC suspension ciprofloxacin 0.3 %-dexamethasone 0.1 % ear drops,suspension (Patient not taking: Reported on 04/27/2020)     Sodium Fluoride (CLINPRO 5000) 1.1 % PSTE Clinpro 5000 1.1 % dental paste (Patient not taking: Reported on 04/27/2020)     triamcinolone ointment (KENALOG) 0.1 % triamcinolone acetonide 0.1 % topical ointment (Patient not taking: Reported on 04/27/2020)     amoxicillin (AMOXIL) 500 MG capsule amoxicillin 500 mg capsule (Patient not taking: Reported on 04/27/2020)     No facility-administered medications prior to visit.    Allergies  Allergen Reactions   Gabapentin Anxiety    Elevated heart rate and crazy dreams   Prednisone Anxiety and Other (See Comments)    REACTION: funny feeling    ROS As per HPI  PE: Vitals with BMI 05/25/2020 04/27/2020 03/24/2020  Height 5\' 0"  5\' 0"  5\' 0"   Weight (No Data) - 132 lbs 3 oz  BMI - - 54.27  Systolic 062 376 283  Diastolic 80 76 78  Pulse 84 90 72     Gen: Alert, well appearing.  Patient is oriented to person, place, time, and situation. AFFECT: pleasant, lucid thought and speech. ENT: Ears: Right side EAC clear, normal epithelium, no TM present, light yellow debris in middle ear, no pus.    Left EAC normal, TM with good light reflex and landmarks.  Eyes: no injection, icteris, swelling, or exudate.  EOMI, PERRLA. Nose: no  drainage or turbinate edema/swelling.  No injection or focal lesion.  Mouth: lips  without lesion/swelling.  Oral mucosa pink and moist.  Dentition intact and without obvious caries or gingival swelling.  Oropharynx without erythema, exudate, or swelling.  CV: RRR, very soft systolic murmur, no r/g.   LUNGS: CTA bilat, nonlabored resps, good aeration in all lung fields. Neuro: CN 2-12 intact bilaterally, strength 5/5 in proximal and distal upper extremities and lower extremities bilaterally except possibly a touch of weakness in R LL.  No tremor.  FNF a little impaired on R arm, normal on L.  No ataxia.    LABS:    Chemistry      Component Value Date/Time   NA 135 02/17/2020 0542   K 4.8 02/17/2020 0542   CL 104 02/17/2020 0542   CO2 17 (L) 02/17/2020 0542   BUN 11 02/17/2020 0542   CREATININE 0.81 02/17/2020 0542   CREATININE 0.82 12/19/2017 1207      Component Value Date/Time   CALCIUM 9.3 02/17/2020 0542   ALKPHOS 60 01/30/2020 0526   AST 31 01/30/2020 0526   ALT 33 01/30/2020 0526   BILITOT 1.2 01/30/2020 0526     Lab Results  Component Value Date   WBC 5.3 02/17/2020   HGB 12.5 02/17/2020   HCT 37.0 02/17/2020   MCV 91.6 02/17/2020   PLT 189 02/17/2020   Lab Results  Component Value Date   HGBA1C 5.0 01/28/2020   Lab Results  Component Value Date   CHOL 163 01/28/2020   HDL 75 01/28/2020   LDLCALC 81 01/28/2020   LDLDIRECT 136.9 09/20/2007   TRIG 33 01/28/2020   CHOLHDL 2.2 01/28/2020   Lab Results  Component Value Date   TSH 1.08 09/25/2019   POC DIPSTICK UA today: pt could not give urine sample  IMPRESSION AND PLAN:  1) Hx of hemorrhagic/hypertensive CVA, minimal residual RU and RL ext weakness, with some contractures on R.  Stable. Cont PT and OT and f/u with Dr. Angelique Holm for botox inj for contractures. She'll try a 1/2 dose of her tizanadine to see if helps but not excessively sedating in daytime. BP control is good with lisinopril 5mg  qd and calan sr 180 mg qd. CBC and BMET today.  2) Recurrent yeast vaginitis + recurrent  UTI's: Her recent vag itching + urinary urgency and frequency seem to have responded well to 1 dose of diflucan 150mg . Would like to get UA to make sure no sign of UTI but she could not produce a sample today.  She'll try to collect one tomorrow and son will bring it up here to do ua and c/s on it. I rx'd more diflucan for her to have for prn use at home.  3) HA, tension-type, intractable. Encouraged her to take a whole vicodin tab for the HA: no sign of any inc ICP.  An After Visit Summary was printed and given to the patient.  FOLLOW UP: Return in about 1 week (around 06/01/2020), or if symptoms worsen or fail to improve, for f/u Headaches.  Signed:  Crissie Sickles, MD           05/25/2020

## 2020-05-26 ENCOUNTER — Telehealth: Payer: Self-pay | Admitting: Family Medicine

## 2020-05-26 ENCOUNTER — Ambulatory Visit (INDEPENDENT_AMBULATORY_CARE_PROVIDER_SITE_OTHER): Payer: Medicare Other

## 2020-05-26 DIAGNOSIS — R350 Frequency of micturition: Secondary | ICD-10-CM | POA: Diagnosis not present

## 2020-05-26 LAB — CBC WITH DIFFERENTIAL/PLATELET
Basophils Absolute: 0.1 10*3/uL (ref 0.0–0.1)
Basophils Relative: 1.1 % (ref 0.0–3.0)
Eosinophils Absolute: 0.1 10*3/uL (ref 0.0–0.7)
Eosinophils Relative: 1.8 % (ref 0.0–5.0)
HCT: 40.9 % (ref 36.0–46.0)
Hemoglobin: 14.2 g/dL (ref 12.0–15.0)
Lymphocytes Relative: 39.9 % (ref 12.0–46.0)
Lymphs Abs: 2.7 10*3/uL (ref 0.7–4.0)
MCHC: 34.7 g/dL (ref 30.0–36.0)
MCV: 89.5 fl (ref 78.0–100.0)
Monocytes Absolute: 0.6 10*3/uL (ref 0.1–1.0)
Monocytes Relative: 8.1 % (ref 3.0–12.0)
Neutro Abs: 3.4 10*3/uL (ref 1.4–7.7)
Neutrophils Relative %: 49.1 % (ref 43.0–77.0)
Platelets: 230 10*3/uL (ref 150.0–400.0)
RBC: 4.57 Mil/uL (ref 3.87–5.11)
RDW: 12.8 % (ref 11.5–15.5)
WBC: 6.8 10*3/uL (ref 4.0–10.5)

## 2020-05-26 LAB — POCT URINALYSIS DIPSTICK
Bilirubin, UA: NEGATIVE
Blood, UA: NEGATIVE
Glucose, UA: NEGATIVE
Ketones, UA: NEGATIVE
Nitrite, UA: NEGATIVE
Protein, UA: NEGATIVE
Spec Grav, UA: 1.015 (ref 1.010–1.025)
Urobilinogen, UA: 0.2 E.U./dL
pH, UA: 6.5 (ref 5.0–8.0)

## 2020-05-26 LAB — BASIC METABOLIC PANEL
BUN: 12 mg/dL (ref 6–23)
CO2: 27 mEq/L (ref 19–32)
Calcium: 9.4 mg/dL (ref 8.4–10.5)
Chloride: 96 mEq/L (ref 96–112)
Creatinine, Ser: 0.8 mg/dL (ref 0.40–1.20)
GFR: 71.96 mL/min (ref >60.00)
Glucose, Bld: 104 mg/dL — ABNORMAL HIGH (ref 70–99)
Potassium: 4.2 mEq/L (ref 3.5–5.1)
Sodium: 131 mEq/L — ABNORMAL LOW (ref 135–145)

## 2020-05-26 NOTE — Telephone Encounter (Signed)
Spouse stated patient is too ill to do AWV and declined.-do not call

## 2020-05-27 ENCOUNTER — Ambulatory Visit: Payer: Medicare Other | Admitting: Physical Medicine & Rehabilitation

## 2020-05-27 LAB — URINE CULTURE
MICRO NUMBER:: 11257793
SPECIMEN QUALITY:: ADEQUATE

## 2020-06-02 ENCOUNTER — Encounter: Payer: Self-pay | Admitting: Adult Health

## 2020-06-02 ENCOUNTER — Ambulatory Visit (INDEPENDENT_AMBULATORY_CARE_PROVIDER_SITE_OTHER): Payer: Medicare Other | Admitting: Adult Health

## 2020-06-02 VITALS — BP 121/70 | HR 83 | Ht 60.0 in | Wt 121.0 lb

## 2020-06-02 DIAGNOSIS — I10 Essential (primary) hypertension: Secondary | ICD-10-CM | POA: Diagnosis not present

## 2020-06-02 DIAGNOSIS — I610 Nontraumatic intracerebral hemorrhage in hemisphere, subcortical: Secondary | ICD-10-CM

## 2020-06-02 DIAGNOSIS — G8111 Spastic hemiplegia affecting right dominant side: Secondary | ICD-10-CM

## 2020-06-02 DIAGNOSIS — G43109 Migraine with aura, not intractable, without status migrainosus: Secondary | ICD-10-CM | POA: Diagnosis not present

## 2020-06-02 DIAGNOSIS — Z9189 Other specified personal risk factors, not elsewhere classified: Secondary | ICD-10-CM
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
## 2020-06-02 NOTE — Patient Instructions (Signed)
Keep working with therapies for hopeful ongoing improvement  Would recommend further evaluation for possible underlying sleep apnea - please call office if interested in pursuing further evaluation  Continue aspirin 81 mg daily for secondary stroke prevention  Continue to follow up with PCP regarding blood pressure management  Maintain strict control of hypertension with blood pressure goal below 130/90     Followup in the future with me in 4 months or call earlier if needed       Thank you for coming to see Alison Cuevas at [facility]. I hope we have been able to provide you high quality care today.  You may receive a patient satisfaction survey over the next few weeks. We would appreciate your feedback and comments so that we may continue to improve ourselves and the health of our patients.      Sleep Apnea Sleep apnea is a condition in which breathing pauses or becomes shallow during sleep. Episodes of sleep apnea usually last 10 seconds or longer, and they may occur as many as 20 times an hour. Sleep apnea disrupts your sleep and keeps your body from getting the rest that it needs. This condition can increase your risk of certain health problems, including:  Heart attack.  Stroke.  Obesity.  Diabetes.  Heart failure.  Irregular heartbeat. What are the causes? There are three kinds of sleep apnea:  Obstructive sleep apnea. This kind is caused by a blocked or collapsed airway.  Central sleep apnea. This kind happens when the part of the brain that controls breathing does not send the correct signals to the muscles that control breathing.  Mixed sleep apnea. This is a combination of obstructive and central sleep apnea. The most common cause of this condition is a collapsed or blocked airway. An airway can collapse or become blocked if:  Your throat muscles are abnormally relaxed.  Your tongue and tonsils are larger than normal.  You are overweight.   Your airway is smaller than normal. What increases the risk? You are more likely to develop this condition if you:  Are overweight.  Smoke.  Have a smaller than normal airway.  Are elderly.  Are female.  Drink alcohol.  Take sedatives or tranquilizers.  Have a family history of sleep apnea. What are the signs or symptoms? Symptoms of this condition include:  Trouble staying asleep.  Daytime sleepiness and tiredness.  Irritability.  Loud snoring.  Morning headaches.  Trouble concentrating.  Forgetfulness.  Decreased interest in sex.  Unexplained sleepiness.  Mood swings.  Personality changes.  Feelings of depression.  Waking up often during the night to urinate.  Dry mouth.  Sore throat. How is this diagnosed? This condition may be diagnosed with:  A medical history.  A physical exam.  A series of tests that are done while you are sleeping (sleep study). These tests are usually done in a sleep lab, but they may also be done at home. How is this treated? Treatment for this condition aims to restore normal breathing and to ease symptoms during sleep. It may involve managing health issues that can affect breathing, such as high blood pressure or obesity. Treatment may include:  Sleeping on your side.  Using a decongestant if you have nasal congestion.  Avoiding the use of depressants, including alcohol, sedatives, and narcotics.  Losing weight if you are overweight.  Making changes to your diet.  Quitting smoking.  Using a device to open your airway while you sleep, such as: ?  An oral appliance. This is a custom-made mouthpiece that shifts your lower jaw forward. ? A continuous positive airway pressure (CPAP) device. This device blows air through a mask when you breathe out (exhale). ? A nasal expiratory positive airway pressure (EPAP) device. This device has valves that you put into each nostril. ? A bi-level positive airway pressure (BPAP)  device. This device blows air through a mask when you breathe in (inhale) and breathe out (exhale).  Having surgery if other treatments do not work. During surgery, excess tissue is removed to create a wider airway. It is important to get treatment for sleep apnea. Without treatment, this condition can lead to:  High blood pressure.  Coronary artery disease.  In men, an inability to achieve or maintain an erection (impotence).  Reduced thinking abilities. Follow these instructions at home: Lifestyle  Make any lifestyle changes that your health care provider recommends.  Eat a healthy, well-balanced diet.  Take steps to lose weight if you are overweight.  Avoid using depressants, including alcohol, sedatives, and narcotics.  Do not use any products that contain nicotine or tobacco, such as cigarettes, e-cigarettes, and chewing tobacco. If you need help quitting, ask your health care provider. General instructions  Take over-the-counter and prescription medicines only as told by your health care provider.  If you were given a device to open your airway while you sleep, use it only as told by your health care provider.  If you are having surgery, make sure to tell your health care provider you have sleep apnea. You may need to bring your device with you.  Keep all follow-up visits as told by your health care provider. This is important. Contact a health care provider if:  The device that you received to open your airway during sleep is uncomfortable or does not seem to be working.  Your symptoms do not improve.  Your symptoms get worse. Get help right away if:  You develop: ? Chest pain. ? Shortness of breath. ? Discomfort in your back, arms, or stomach.  You have: ? Trouble speaking. ? Weakness on one side of your body. ? Drooping in your face. These symptoms may represent a serious problem that is an emergency. Do not wait to see if the symptoms will go away. Get  medical help right away. Call your local emergency services (911 in the [place] Do not drive yourself to the hospital. Summary  Sleep apnea is a condition in which breathing pauses or becomes shallow during sleep.  The most common cause is a collapsed or blocked airway.  The goal of treatment is to restore normal breathing and to ease symptoms during sleep. This information is not intended to replace advice given to you by your health care provider. Make sure you discuss any questions you have with your health care provider. Document Revised: 11/28/2018 Document Reviewed: 02/06/2018 Elsevier Patient Education  [employer].

## 2020-06-02 NOTE — Progress Notes (Signed)
Alison Cuevas is a 74 y.o. female is here with her son. Pt said she is concerned about bring able to walk and move her right arm above her head. Pts son said she had  headache for 3 days last week. He also said a week before the stroke she has a headache for 7 days.

## 2020-06-03 DIAGNOSIS — K5790 Diverticulosis of intestine, part unspecified, without perforation or abscess without bleeding: Secondary | ICD-10-CM | POA: Diagnosis not present

## 2020-06-03 DIAGNOSIS — G4700 Insomnia, unspecified: Secondary | ICD-10-CM | POA: Diagnosis not present

## 2020-06-03 DIAGNOSIS — M797 Fibromyalgia: Secondary | ICD-10-CM | POA: Diagnosis not present

## 2020-06-03 DIAGNOSIS — Z7982 Long term (current) use of aspirin: Secondary | ICD-10-CM | POA: Diagnosis not present

## 2020-06-03 DIAGNOSIS — M1990 Unspecified osteoarthritis, unspecified site: Secondary | ICD-10-CM | POA: Diagnosis not present

## 2020-06-03 DIAGNOSIS — Z87440 Personal history of urinary (tract) infections: Secondary | ICD-10-CM | POA: Diagnosis not present

## 2020-06-03 DIAGNOSIS — J309 Allergic rhinitis, unspecified: Secondary | ICD-10-CM | POA: Diagnosis not present

## 2020-06-03 DIAGNOSIS — I129 Hypertensive chronic kidney disease with stage 1 through stage 4 chronic kidney disease, or unspecified chronic kidney disease: Secondary | ICD-10-CM | POA: Diagnosis not present

## 2020-06-03 DIAGNOSIS — M8580 Other specified disorders of bone density and structure, unspecified site: Secondary | ICD-10-CM | POA: Diagnosis not present

## 2020-06-03 DIAGNOSIS — K219 Gastro-esophageal reflux disease without esophagitis: Secondary | ICD-10-CM | POA: Diagnosis not present

## 2020-06-03 DIAGNOSIS — N189 Chronic kidney disease, unspecified: Secondary | ICD-10-CM | POA: Diagnosis not present

## 2020-06-03 DIAGNOSIS — G894 Chronic pain syndrome: Secondary | ICD-10-CM | POA: Diagnosis not present

## 2020-06-03 DIAGNOSIS — E785 Hyperlipidemia, unspecified: Secondary | ICD-10-CM | POA: Diagnosis not present

## 2020-06-03 DIAGNOSIS — M7061 Trochanteric bursitis, right hip: Secondary | ICD-10-CM | POA: Diagnosis not present

## 2020-06-03 DIAGNOSIS — Z9181 History of falling: Secondary | ICD-10-CM | POA: Diagnosis not present

## 2020-06-03 DIAGNOSIS — I69153 Hemiplegia and hemiparesis following nontraumatic intracerebral hemorrhage affecting right non-dominant side: Secondary | ICD-10-CM | POA: Diagnosis not present

## 2020-06-03 DIAGNOSIS — M5030 Other cervical disc degeneration, unspecified cervical region: Secondary | ICD-10-CM | POA: Diagnosis not present

## 2020-06-03 DIAGNOSIS — F4323 Adjustment disorder with mixed anxiety and depressed mood: Secondary | ICD-10-CM | POA: Diagnosis not present

## 2020-06-03 DIAGNOSIS — M21371 Foot drop, right foot: Secondary | ICD-10-CM | POA: Diagnosis not present

## 2020-06-03 DIAGNOSIS — K5900 Constipation, unspecified: Secondary | ICD-10-CM | POA: Diagnosis not present

## 2020-06-03 NOTE — Progress Notes (Signed)
I agree with the above plan 

## 2020-06-08 ENCOUNTER — Telehealth: Payer: Self-pay | Admitting: Family Medicine

## 2020-06-08 NOTE — Telephone Encounter (Signed)
Verbal ok given to [place].

## 2020-06-08 NOTE — Telephone Encounter (Signed)
Okay for orders? 

## 2020-06-08 NOTE — Telephone Encounter (Signed)
Alison Cuevas with [facility] health, called for verbal orders as follows: Home health OT once a week x 1 week, then twice a week x 2 weeks, effective 12/20. Her call back number is [phone].

## 2020-06-08 NOTE — Telephone Encounter (Signed)
Yes okay 

## 2020-06-10 ENCOUNTER — Other Ambulatory Visit: Payer: Self-pay

## 2020-06-10 ENCOUNTER — Telehealth: Payer: Self-pay

## 2020-06-10 ENCOUNTER — Encounter: Payer: Medicare Other | Attending: Physical Medicine & Rehabilitation | Admitting: Physical Medicine & Rehabilitation

## 2020-06-10 ENCOUNTER — Encounter: Payer: Self-pay | Admitting: Physical Medicine & Rehabilitation

## 2020-06-10 VITALS — BP 174/92 | HR 84 | Temp 98.2°F | Ht 60.0 in | Wt 126.2 lb

## 2020-06-10 DIAGNOSIS — G8111 Spastic hemiplegia affecting right dominant side: Secondary | ICD-10-CM | POA: Diagnosis not present

## 2020-06-10 NOTE — Progress Notes (Signed)
Botox Injection for spasticity using needle EMG guidance Indication: No diagnosis found. G81.11  Dilution: 100 Units/ml        Total Units Injected: 400 Indication: Severe spasticity which interferes with ADL,mobility and/or  hygiene and is unresponsive to medication management and other conservative care Informed consent was obtained after describing risks and benefits of the procedure with the patient. This includes bleeding, bruising, infection, excessive weakness, or medication side effects. A REMS form is on file and signed.  Right Needle: 89mm injectable monopolar needle electrode  Number of units per muscle Pectoralis Major 0 units Pectoralis Minor 0 units Biceps 100 units Brachioradialis 0 units FCR 0 units FCU 0 units FDS 0 units FDP 0 units FPL 0 units Pronator Teres 0 units Pronator Quadratus 0 units Lumbricals 0 units Quadriceps 0 units Gastroc/soleus 200 units Hamstrings 0 units Tibialis Posterior 100 units Tibialis Anterior 0 units EHL 0 units All injections were done after obtaining appropriate EMG activity and after negative drawback for blood. The patient tolerated the procedure well. Post procedure instructions were given. Return in about 2 months (around 08/11/2020).

## 2020-06-10 NOTE — Patient Instructions (Signed)
PLEASE FEEL FREE TO CALL OUR OFFICE WITH ANY PROBLEMS OR QUESTIONS ([phone])                                @                 @@               @@@                        @@@@                      @@@@@         @@@@@@                  @@@@@@@                @@@@@@@@              @@@@@@@@@             @@@@@@@@@@       IIII                  IIII                                                        HAPPY HOLIDAYS!!!!!  

## 2020-06-10 NOTE — Telephone Encounter (Signed)
Received MSV evaluation for patient, Placed on PCP desk to review and sign, if appropriate.

## 2020-06-25 ENCOUNTER — Emergency Department (HOSPITAL_COMMUNITY)
Admission: EM | Admit: 2020-06-25 | Discharge: 2020-06-26 | Disposition: A | Discharge status: Home / Self Care | Payer: Medicare Other | Attending: Emergency Medicine | Admitting: Emergency Medicine

## 2020-06-25 ENCOUNTER — Other Ambulatory Visit: Payer: Self-pay

## 2020-06-25 ENCOUNTER — Encounter (HOSPITAL_COMMUNITY): Payer: Self-pay

## 2020-06-25 DIAGNOSIS — R1084 Generalized abdominal pain: Secondary | ICD-10-CM

## 2020-06-25 DIAGNOSIS — R55 Syncope and collapse: Secondary | ICD-10-CM | POA: Diagnosis not present

## 2020-06-25 DIAGNOSIS — I10 Essential (primary) hypertension: Secondary | ICD-10-CM | POA: Diagnosis not present

## 2020-06-25 DIAGNOSIS — M47816 Spondylosis without myelopathy or radiculopathy, lumbar region: Secondary | ICD-10-CM | POA: Diagnosis not present

## 2020-06-25 DIAGNOSIS — Z79899 Other long term (current) drug therapy: Secondary | ICD-10-CM | POA: Diagnosis not present

## 2020-06-25 DIAGNOSIS — Z7982 Long term (current) use of aspirin: Secondary | ICD-10-CM | POA: Insufficient documentation

## 2020-06-25 DIAGNOSIS — R531 Weakness: Secondary | ICD-10-CM

## 2020-06-25 DIAGNOSIS — R109 Unspecified abdominal pain: Secondary | ICD-10-CM | POA: Insufficient documentation

## 2020-06-25 DIAGNOSIS — K5750 Diverticulosis of both small and large intestine without perforation or abscess without bleeding: Secondary | ICD-10-CM | POA: Diagnosis not present

## 2020-06-25 DIAGNOSIS — M4186 Other forms of scoliosis, lumbar region: Secondary | ICD-10-CM | POA: Diagnosis not present

## 2020-06-25 DIAGNOSIS — M79606 Pain in leg, unspecified: Secondary | ICD-10-CM

## 2020-06-25 DIAGNOSIS — N182 Chronic kidney disease, stage 2 (mild): Secondary | ICD-10-CM | POA: Diagnosis not present

## 2020-06-25 DIAGNOSIS — E860 Dehydration: Secondary | ICD-10-CM | POA: Diagnosis not present

## 2020-06-25 DIAGNOSIS — R42 Dizziness and giddiness: Secondary | ICD-10-CM | POA: Diagnosis not present

## 2020-06-25 DIAGNOSIS — I7100 Dissection of unspecified site of aorta: Secondary | ICD-10-CM | POA: Diagnosis not present

## 2020-06-25 DIAGNOSIS — I129 Hypertensive chronic kidney disease with stage 1 through stage 4 chronic kidney disease, or unspecified chronic kidney disease: Secondary | ICD-10-CM | POA: Insufficient documentation

## 2020-06-25 DIAGNOSIS — R519 Headache, unspecified: Secondary | ICD-10-CM | POA: Insufficient documentation

## 2020-06-25 DIAGNOSIS — G4489 Other headache syndrome: Secondary | ICD-10-CM | POA: Diagnosis not present

## 2020-06-25 DIAGNOSIS — R0902 Hypoxemia: Secondary | ICD-10-CM | POA: Diagnosis not present

## 2020-06-25 DIAGNOSIS — N390 Urinary tract infection, site not specified: Secondary | ICD-10-CM | POA: Diagnosis not present

## 2020-06-25 LAB — CBC WITH DIFFERENTIAL/PLATELET
Abs Immature Granulocytes: 0.04 10*3/uL (ref 0.00–0.07)
Basophils Absolute: 0 10*3/uL (ref 0.0–0.1)
Basophils Relative: 0 %
Eosinophils Absolute: 0 10*3/uL (ref 0.0–0.5)
Eosinophils Relative: 0 %
HCT: 40 % (ref 36.0–46.0)
Hemoglobin: 13.4 g/dL (ref 12.0–15.0)
Immature Granulocytes: 0 %
Lymphocytes Relative: 26 %
Lymphs Abs: 2.3 10*3/uL (ref 0.7–4.0)
MCH: 30.8 pg (ref 26.0–34.0)
MCHC: 33.5 g/dL (ref 30.0–36.0)
MCV: 92 fL (ref 80.0–100.0)
Monocytes Absolute: 0.6 10*3/uL (ref 0.1–1.0)
Monocytes Relative: 7 %
Neutro Abs: 6 10*3/uL (ref 1.7–7.7)
Neutrophils Relative %: 67 %
Platelets: 217 10*3/uL (ref 150–400)
RBC: 4.35 MIL/uL (ref 3.87–5.11)
RDW: 12.5 % (ref 11.5–15.5)
WBC: 9 10*3/uL (ref 4.0–10.5)
nRBC: 0 % (ref 0.0–0.2)

## 2020-06-25 LAB — D-DIMER, QUANTITATIVE: D-Dimer, Quant: 0.54 ug/mL-FEU — ABNORMAL HIGH (ref 0.00–0.50)

## 2020-06-25 NOTE — ED Notes (Signed)
orthostatis v/s completed. Pt denies any dizziness. Gave pt ginerale and crackers

## 2020-06-25 NOTE — ED Notes (Signed)
Pt c/o dizziness- vitals rechecked

## 2020-06-25 NOTE — ED Provider Notes (Signed)
[facility] EMERGENCY DEPARTMENT Provider Note   CSN: [id number] Arrival date & time: 06/25/20  1353     History Chief Complaint  Patient presents with  . Hypotension    Alison Cuevas is a 74 y.o. female.  HPI    74 year old female comes in a chief complaint of near syncope and low blood pressure. Patient recently had a stroke.  She reports that she was laying on her recliner, and had just gotten off of the phone with her friend as the occupational therapist was walking in, when she started feeling off.  Her daughter walked in soon after and patient asked her to check her blood pressure.  Per daughter, the BP was in the 60s systolic.  The physical therapist rechecked the blood pressure and it was 60/40 manually.  Patient felt clammy, dizzy and had near fainting type feeling.  She had no chest pain, palpitations, shortness of breath.  Her heart rate was in the 90s.  She did have severe abdominal pain.  The episode lasted for about 5 minutes, and with her having abdominal pain, they decided to call 911.  Once paramedic arrived, patient's blood pressure was better and her abdominal pain had resolved.  Patient is only having a headache now, but she has history of chronic headaches.  Since the stroke, patient's activity is restricted.  She wears a brace in the right lower extremity.  There is no history of blood clots.  Patient's father had abdominal aneurysm, patient does not smoke.  She currently has no abdominal pain, back pain.  Past Medical History:  Diagnosis Date  . Allergic rhinitis    maple pollen  . Anxiety   . Arthritis   . Bowel obstruction (HCC)   . Chronic low back pain    Dr. Dutch Quint.  Has had back injection--bp elevated after.  . Chronic pain syndrome   . Chronic renal insufficiency, stage 2 (mild)    GFR 60-70  . DDD (degenerative disc disease), cervical    Hx of ACDF (Dr. Jordan Likes).  Followed by Dr. Charlett Blake.  Also, Dr. Vickki Hearing do left C7-T1  intralaminal epidural injection.  . Diverticulosis of colon   . DJD (degenerative joint disease)   . Gallstones   . GERD (gastroesophageal reflux disease)   . Herpes zoster 07/08/2014  . 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  Resid R arm and leg weakness + flexion contractures.  . Hypercholesteremia    mild; pt declined statin trial 05/2014--needs recheck lipid panel at first f/u visit in 2017  . Hypertension   . Osteopenia 06/2017   T score -1.4 FRAX 15% / 2%  . Ovarian cyst 2017   82019-robotic assisted bilatel SPO: all path benign.  . Perianal dermatitis    prn cutivate  . Phlebitis   . Right hemiplegia (HCC) 01/2020   R arm and leg (hemorrhagic L cerebral hemisphere CVA)  . Right knee injury 2018   Patellofemoral crush injury--Dr. Charlett Blake.  . Trochanteric bursitis of right hip    Recurrent (injection by Dr. Charlett Blake 05/26/15)  . Tympanic membrane rupture, right 12/01/2016   As of 08/2018 pt set for tympanomastoidectomy and STSG (Dr. Ignacia Felling). Recurrent fungal/bact OE.  . Varicose vein    left leg     Patient Active Problem List   Diagnosis Date Noted  . Pain in joint of right foot 04/15/2020  . 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 Chronic otitis externa  of right ear 02/20/2020  . Intraparenchymal hemorrhage of brain ([place])   . Delirium   . 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 Stroke (cerebrum) ([place]) 01/26/2020  . Conductive hearing loss of right ear with unrestricted hearing of left ear 05/28/2019  . Hyponatremia 08/31/2017  . Generalized weakness 08/31/2017  . Dehydration 08/31/2017  . UTI (urinary tract infection) 08/31/2017  . Tympanic membrane rupture, right 12/01/2016  . Right ovarian cyst 08/22/2016  . Fibromyalgia 06/03/2015  . Cystocele 03/18/2014  . 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 Diverticulosis of colon without hemorrhage  06/12/2013  . Hypertension   . Osteopenia   . Chronic low back pain 05/12/2011  . HYPERCHOLESTEROLEMIA 03/17/2008  . Anxiety state 09/17/2007  . GERD 09/17/2007  . DJD (degenerative joint disease) 09/17/2007    Past Surgical History:  Procedure Laterality Date  . ABDOMINAL HYSTERECTOMY  03/2015   TAH.  Last pap 2016.  No hx of abnl paps.  Per GYN, no further pap smears indicated.  . ANTERIOR AND POSTERIOR REPAIR N/A 03/18/2014   Procedure: Cystocele repair with graft, Vault suspension, Rectocele repair;  Surgeon: [name], MD;  Location: [facility] ORS;  Service: Urology;  Laterality: N/A;  . CHOLECYSTECTOMY    . CHOLECYSTECTOMY, LAPAROSCOPIC    . COLON SURGERY    . COLONOSCOPY  04/2006; 05/26/16   2007 (Dr. [name]): Normal.  04/2016 (Dr. [name]) normal except diverticulosis and decreased anal sphincter tone.  No repeat colonoscopy is recommended due to age.  . cspine surgery     Dr. Wiliam Ke level ant cerv discectomy /fusion w/plating  . CYSTO N/A 03/18/2014   Procedure: CYSTO;  Surgeon: [name], MD;  Location: [facility] ORS;  Service: Urology;  Laterality: N/A;  . DEXA  07/30/2015; 06/2018   2017 and 2020 -->Osteopenia--repeat 2 yrs.  Marland Kitchen LYSIS OF ADHESION N/A 01/31/2018   Procedure: POSSIBLE LYSIS OF ADHESION;  Surgeon: Everitt Amber, MD;  Location: [facility];  Service: Gynecology;  Laterality: N/A;  . [place]     with prolasped bledder repair  . RESECTION OF COLON     BENIGN TUMOR  . RIGHT EAR SURGERY  08/23/2017   Dr. 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 right ear surgery  12/12/2018   Right tympanomastoidectomy, ossiculoplasty with partial prosthesis, split thickness skin graft from postauricular skin 1x1cm, and right tragal cartilage graft 1x1 cm [facility])  . right hemicolectomy for diverticulitis with abscess  1993  . ROBOTIC  ASSISTED BILATERAL SALPINGO OOPHERECTOMY Bilateral 01/31/2018   All PATH benign.  Procedure: XI ROBOTIC ASSISTED BILATERAL SALPINGO OOPHORECTOMY;  Surgeon: Everitt Amber, MD;  Location: [facility];  Service: Gynecology;  Laterality: Bilateral;  . TRANSTHORACIC ECHOCARDIOGRAM  01/27/2020   EF 65-70%, NORMAL.  . TUBAL LIGATION       OB History    Gravida  4   Para  3   Term      Preterm      AB  1   Living  3     SAB      IAB      Ectopic      Multiple      Live Births              Family History  Problem Relation Age of Onset  . Hypertension Mother   . Diverticulitis Mother   .  Breast cancer Sister 38  . Melanoma Brother   . Prostate cancer Brother   . Leukemia Brother   . Lupus Sister   . Lung disease Brother   . Stomach cancer Father   . Other Other        Family member with MGUS    Social History   Tobacco Use  . Smoking status: Never Smoker  . Smokeless tobacco: Never Used  Vaping Use  . Vaping Use: Never used  Substance Use Topics  . Alcohol use: No    Alcohol/week: 0.0 standard drinks  . Drug use: No    Home Medications Prior to Admission medications   Medication Sig Start Date End Date Taking? Authorizing Provider  acetaminophen (TYLENOL) 500 MG tablet Take 1 tablet (500 mg total) by mouth 3 (three) times daily. 02/19/20   Love, Evlyn Kanner, PA-C  ALPRAZolam Prudy Feeler) 0.5 MG tablet alprazolam 0.5 mg tablet    [provider]  aspirin EC 81 MG tablet Take 1 tablet (81 mg total) by mouth daily. Swallow whole. 04/20/20   Ihor Austin, NP  Biotin 5 MG TABS Take 5 mg by mouth daily.    [provider]  Carboxymethylcellul-Glycerin (LUBRICATING EYE DROPS OP) Place 1 drop into both eyes daily as needed (dry eyes).    [provider]  ciprofloxacin-dexamethasone (CIPRODEX) OTIC suspension ciprofloxacin 0.3 %-dexamethasone 0.1 % ear drops,suspension    [provider]  fluticasone (FLONASE) 50 MCG/ACT nasal spray Place 2  sprays into both nostrils at bedtime. 05/17/16   McGowen, Maryjean Morn, MD  HYDROcodone-acetaminophen (NORCO/VICODIN) 5-325 MG tablet Take 0.5 tablets by mouth at bedtime. 02/20/20   Love, Evlyn Kanner, PA-C  lidocaine (LIDODERM) 5 % Place 1 patch onto the skin daily as needed. Remove & Discard patch within 12 hours or as directed by MD 02/19/20   Love, Evlyn Kanner, PA-C  lisinopril (ZESTRIL) 5 MG tablet Take 1 tablet (5 mg total) by mouth daily. 05/06/20   McGowen, Maryjean Morn, MD  loratadine (CLARITIN) 10 MG tablet Take 10 mg by mouth every other day.     [provider]  meclizine (ANTIVERT) 12.5 MG tablet Take 1 tablet (12.5 mg total) by mouth 3 (three) times daily. Patient taking differently: Take 12.5 mg by mouth 3 (three) times daily as needed. 02/19/20   Love, Evlyn Kanner, PA-C  meloxicam (MOBIC) 15 MG tablet Take 1 tablet (15 mg total) by mouth daily as needed for pain. 05/06/20   McGowen, Maryjean Morn, MD  Multiple Minerals-Vitamins (CALCIUM-MAGNESIUM-ZINC-D3 PO) Take 1 tablet by mouth daily.     [provider]  pantoprazole (PROTONIX) 40 MG tablet Take 1 tablet (40 mg total) by mouth at bedtime. 05/06/20   McGowen, Maryjean Morn, MD  polyethylene glycol (MIRALAX / GLYCOLAX) 17 g packet Take 17 g by mouth daily as needed.    [provider]  potassium chloride SA (KLOR-CON) 20 MEQ tablet Take 1 tablet (20 mEq total) by mouth daily. 05/20/20   McGowen, Maryjean Morn, MD  Sodium Fluoride (CLINPRO 5000) 1.1 % PSTE Clinpro 5000 1.1 % dental paste    [provider]  tiZANidine (ZANAFLEX) 2 MG tablet 1 tab po tid prn for muscle relaxation 05/06/20   McGowen, Maryjean Morn, MD  triamcinolone ointment (KENALOG) 0.1 % triamcinolone acetonide 0.1 % topical ointment    [provider]  verapamil (CALAN-SR) 180 MG CR tablet Take 1 tablet (180 mg total) by mouth daily. 05/06/20   McGowen, Maryjean Morn, MD  Allergies    Gabapentin and Prednisone  Review of Systems   Review of Systems   Constitutional: Positive for activity change.  Respiratory: Negative for shortness of breath.   Cardiovascular: Negative for chest pain.  Gastrointestinal: Negative for nausea and vomiting.  Neurological: Positive for syncope.  All other systems reviewed and are negative.   Physical Exam Updated Vital Signs BP (!) 157/86   Pulse 99   Temp 98.1 F (36.7 C)   Resp 20   Ht 5' (1.524 m)   Wt 57.2 kg   LMP 06/27/1972   SpO2 97%   BMI 24.65 kg/m   Physical Exam Vitals and nursing note reviewed.  Constitutional:      Appearance: She is well-developed.  HENT:     Head: Normocephalic and atraumatic.  Eyes:     Extraocular Movements: EOM normal.  Cardiovascular:     Rate and Rhythm: Normal rate.  Pulmonary:     Effort: Pulmonary effort is normal.  Abdominal:     General: Bowel sounds are normal.  Musculoskeletal:     Cervical back: Normal range of motion and neck supple.  Skin:    General: Skin is warm and dry.  Neurological:     Mental Status: She is alert and oriented to person, place, and time.     ED Results / Procedures / Treatments   Labs (all labs ordered are listed, but only abnormal results are displayed) Labs Reviewed  COMPREHENSIVE METABOLIC PANEL  CBC WITH DIFFERENTIAL/PLATELET  LIPASE, BLOOD  MAGNESIUM  LACTIC ACID, PLASMA  LACTIC ACID, PLASMA  D-DIMER, QUANTITATIVE (NOT AT [facility])    EKG EKG Interpretation  Date/Time:  Thursday June 25 2020 22:36:34 EST Ventricular Rate:  98 PR Interval:    QRS Duration: 138 QT Interval:  395 QTC Calculation: 505 R Axis:   -7 Text Interpretation: Sinus rhythm Probable left atrial enlargement Left bundle branch block No acute changes No significant change since last tracing Confirmed by Varney Biles [phone]) on 06/25/2020 11:14:53 PM   Radiology No results found.  Procedures Procedures (including critical care time)  Medications Ordered in ED Medications - No data to display  ED Course  I have  reviewed the triage vital signs and the nursing notes.  Pertinent labs & imaging results that were available during my care of the patient were reviewed by me and considered in my medical decision making (see chart for details).    MDM Rules/Calculators/A&P                          74 year old female comes in a chief complaint of near fainting spell.  Her blood pressure was low, she was diaphoretic.  It almost appears like a vasovagal event, but there was no clear trigger besides her wanting to hang up her phone.   Of note, patient had a stroke recently and he she is not as active as she normally was.  She is complaining of some pain in her right groin.  We will get DVT of her leg as an outpatient.  D-dimer ordered right now, she is noted to be tachycardic.  If D-dimer is positive that she will get a CT PE.  If her work-up in the ER is negative then she will be discharged with a diagnosis of vasovagal event.  She had abdominal pain during this low blood pressure event.  She has no risk factors for AAA.  She has no back pain, abdominal pain, palpable  mass.  Have advised her to follow-up with her PCP if she continues to have concerns for AAA, but we do not see any reason to get emergent CT scan of the abdomen or call the on-call person for ultrasound of the abdomen.  Patient and her son are comfortable with the plan.  Dr. Christy Gentles will follow up on the results.  Final Clinical Impression(s) / ED Diagnoses Final diagnoses:  None    Rx / DC Orders ED Discharge Orders    None       Varney Biles, MD 06/25/20 2341

## 2020-06-25 NOTE — ED Triage Notes (Signed)
Pt reports she had gotten off the phone and felt sick. States she became sweaty and occupational therapist came for visit and said her BP was low .Current BP 121/63. Complaining of head ache and ear pain

## 2020-06-26 ENCOUNTER — Emergency Department (HOSPITAL_COMMUNITY): Payer: Medicare Other

## 2020-06-26 DIAGNOSIS — I7100 Dissection of unspecified site of aorta: Secondary | ICD-10-CM | POA: Diagnosis not present

## 2020-06-26 DIAGNOSIS — R55 Syncope and collapse: Secondary | ICD-10-CM | POA: Diagnosis not present

## 2020-06-26 DIAGNOSIS — M4186 Other forms of scoliosis, lumbar region: Secondary | ICD-10-CM | POA: Diagnosis not present

## 2020-06-26 DIAGNOSIS — K5750 Diverticulosis of both small and large intestine without perforation or abscess without bleeding: Secondary | ICD-10-CM | POA: Diagnosis not present

## 2020-06-26 DIAGNOSIS — M47816 Spondylosis without myelopathy or radiculopathy, lumbar region: Secondary | ICD-10-CM | POA: Diagnosis not present

## 2020-06-26 LAB — COMPREHENSIVE METABOLIC PANEL
ALT: 29 U/L (ref 0–44)
AST: 30 U/L (ref 15–41)
Albumin: 4.1 g/dL (ref 3.5–5.0)
Alkaline Phosphatase: 95 U/L (ref 38–126)
Anion gap: 10 (ref 5–15)
BUN: 16 mg/dL (ref 8–23)
CO2: 24 mmol/L (ref 22–32)
Calcium: 9.8 mg/dL (ref 8.9–10.3)
Chloride: 102 mmol/L (ref 98–111)
Creatinine, Ser: 0.88 mg/dL (ref 0.44–1.00)
GFR, Estimated: >60 mL/min (ref >60)
Glucose, Bld: 94 mg/dL (ref 70–99)
Potassium: 4.3 mmol/L (ref 3.5–5.1)
Sodium: 136 mmol/L (ref 135–145)
Total Bilirubin: 1 mg/dL (ref 0.3–1.2)
Total Protein: 6.9 g/dL (ref 6.5–8.1)

## 2020-06-26 LAB — LIPASE, BLOOD: Lipase: 22 U/L (ref 11–51)

## 2020-06-26 LAB — LACTIC ACID, PLASMA: Lactic Acid, Venous: 0.9 mmol/L (ref 0.5–1.9)

## 2020-06-26 LAB — MAGNESIUM: Magnesium: 2.1 mg/dL (ref 1.7–2.4)

## 2020-06-26 MED ORDER — IOHEXOL 350 MG/ML SOLN
100.0000 mL | Freq: Once | INTRAVENOUS | Status: AC | PRN
  Administered 2020-06-26: 100 mL via INTRAVENOUS

## 2020-06-26 MED ORDER — HYDROCODONE-ACETAMINOPHEN 5-325 MG PO TABS
1.0000 | ORAL_TABLET | Freq: Once | ORAL | Status: AC
  Administered 2020-06-26: 1 via ORAL
  Filled 2020-06-26: qty 1

## 2020-06-26 NOTE — Discharge Instructions (Addendum)

## 2020-06-26 NOTE — ED Provider Notes (Signed)
Patient is much improved.  She did have abdominal and groin pain that is improved.  Distal pulses intact.  Due to episode of syncope, hypotension and abdominal pain previously, I recommended CT abdomen pelvis. CT scan negative for acute process or vascular catastrophe. I reviewed telemetry monitoring and no arrhythmias are noted Patient is taking p.o. fluids. D-dimer was less than age-adjusted value. Low suspicion for PE as she denies any chest pain or shortness of breath and has not been any hypoxia. She will follow-up as outpatient for DVT study   Zadie Rhine, MD 06/26/20 [phone]

## 2020-06-29 ENCOUNTER — Telehealth: Payer: Self-pay | Admitting: Family Medicine

## 2020-06-29 NOTE — Telephone Encounter (Signed)
Maurine Minister with [facility] asked for verbal orders to recertify patient for another four weeks for OT and PT. His callback number is [phone].

## 2020-06-29 NOTE — Telephone Encounter (Signed)
Yes okay 

## 2020-06-29 NOTE — Telephone Encounter (Signed)
Orders provided to [facility]. Fax number provided for forms to be signed.

## 2020-06-29 NOTE — Telephone Encounter (Signed)
Ok for orders? 

## 2020-06-30 ENCOUNTER — Encounter: Payer: Self-pay | Admitting: Family Medicine

## 2020-07-01 ENCOUNTER — Telehealth: Payer: Self-pay | Admitting: Family Medicine

## 2020-07-01 ENCOUNTER — Encounter: Payer: Self-pay | Admitting: Family Medicine

## 2020-07-01 ENCOUNTER — Telehealth: Payer: Self-pay

## 2020-07-01 DIAGNOSIS — I6930 Unspecified sequelae of cerebral infarction: Secondary | ICD-10-CM

## 2020-07-01 DIAGNOSIS — R7989 Other specified abnormal findings of blood chemistry: Secondary | ICD-10-CM

## 2020-07-01 DIAGNOSIS — G8111 Spastic hemiplegia affecting right dominant side: Secondary | ICD-10-CM

## 2020-07-01 NOTE — Telephone Encounter (Signed)
Patient sent mychart message on 1/4 stating the following:  "Mom went to the ER Thursday and was told to get an ultrasound. I called [facility] today and they said we had to go thru Dr. Marvel Plan. Please help [place] get the appt with [facility] for a STAT ultrasound." There was an order entered on 06/25/20 for [facility].  Please advise, thanks.    [facility] Day - Client Nonclinical Telephone Record  [facility] Client [facility] Day - Client Client Site [facility] - Day Physician Santiago Bumpers - MD Contact Type Call Who Is Calling Patient / Member / Family / Caregiver Caller Name Lawonda Pretlow Caller Phone Number [phone] Patient Name Alison Cuevas Patient DOB Jun 13, 1946 Call Type Message Only Information Provided Reason for Call Request for General Office Information Initial Comment Caller states his mother was recently admitted into the hospital for a blood clot. Caller states the hospitalist recommended that an ultrasound be scheduled at [facility] to confirm or deny the blood clot. Caller needs a new order from Dr. Milinda Cave. Additional Comment Denied triage and provided office hours. Disp. Time Disposition Final User 06/30/2020 5:08:01 PM General Information Provided Yes Leanne Lovely Call Closed By: Leanne Lovely Transaction Date/Time: 06/30/2020 5:04:09 PM (ET)

## 2020-07-01 NOTE — Telephone Encounter (Signed)
OK, venous ultrasound ordered

## 2020-07-01 NOTE — Telephone Encounter (Signed)
Alison Cuevas with Alison Cuevas called for new verbal orders: [facility] OT twice weekly x 4 weeks and HH aide twice weekly x 4 weeks. Her callback number is [phone].

## 2020-07-01 NOTE — Telephone Encounter (Signed)
Spoke with pt's son, Leonette Most and advised new imaging order entered as STAT. He will contact [facility] for scheduling assistance.

## 2020-07-01 NOTE — Telephone Encounter (Signed)
LM for pt's son, Charles(Bud) to return call.

## 2020-07-01 NOTE — Telephone Encounter (Signed)
Verbal orders provided. Will fax written orders for [facility] OT

## 2020-07-02 ENCOUNTER — Telehealth: Payer: Self-pay

## 2020-07-02 NOTE — Telephone Encounter (Signed)
Signed and put in box to go up front. Signed:  Santiago Bumpers, MD           07/02/2020

## 2020-07-02 NOTE — Telephone Encounter (Signed)
Home health orders received 07/02/20 for [facility] health initiation orders: No.  Home health re-certification orders: Yes. Patient last seen by ordering physician for this condition: 04/27/20. Must be less than 90 days for re-certification and less than 30 days prior for initiation. Visit must have been for the condition the orders are being placed.  Patient meets criteria for Physician to sign orders: Yes.        Current med list has been attached: Yes        Orders placed on physicians desk for signature: 07/02/20 (date) If patient does not meet criteria for orders to be signed: pt was called to schedule appt. Appt is scheduled for n/a.   Alison Cuevas

## 2020-07-03 ENCOUNTER — Telehealth: Payer: Self-pay | Admitting: Medical

## 2020-07-03 DIAGNOSIS — K5790 Diverticulosis of intestine, part unspecified, without perforation or abscess without bleeding: Secondary | ICD-10-CM | POA: Diagnosis not present

## 2020-07-03 DIAGNOSIS — Z7982 Long term (current) use of aspirin: Secondary | ICD-10-CM | POA: Diagnosis not present

## 2020-07-03 DIAGNOSIS — M5030 Other cervical disc degeneration, unspecified cervical region: Secondary | ICD-10-CM | POA: Diagnosis not present

## 2020-07-03 DIAGNOSIS — M8580 Other specified disorders of bone density and structure, unspecified site: Secondary | ICD-10-CM | POA: Diagnosis not present

## 2020-07-03 DIAGNOSIS — M7061 Trochanteric bursitis, right hip: Secondary | ICD-10-CM | POA: Diagnosis not present

## 2020-07-03 DIAGNOSIS — M797 Fibromyalgia: Secondary | ICD-10-CM | POA: Diagnosis not present

## 2020-07-03 DIAGNOSIS — H9011 Conductive hearing loss, unilateral, right ear, with unrestricted hearing on the contralateral side: Secondary | ICD-10-CM | POA: Diagnosis not present

## 2020-07-03 DIAGNOSIS — M1990 Unspecified osteoarthritis, unspecified site: Secondary | ICD-10-CM | POA: Diagnosis not present

## 2020-07-03 DIAGNOSIS — I129 Hypertensive chronic kidney disease with stage 1 through stage 4 chronic kidney disease, or unspecified chronic kidney disease: Secondary | ICD-10-CM | POA: Diagnosis not present

## 2020-07-03 DIAGNOSIS — E871 Hypo-osmolality and hyponatremia: Secondary | ICD-10-CM | POA: Diagnosis not present

## 2020-07-03 DIAGNOSIS — I69153 Hemiplegia and hemiparesis following nontraumatic intracerebral hemorrhage affecting right non-dominant side: Secondary | ICD-10-CM | POA: Diagnosis not present

## 2020-07-03 DIAGNOSIS — F4323 Adjustment disorder with mixed anxiety and depressed mood: Secondary | ICD-10-CM | POA: Diagnosis not present

## 2020-07-03 DIAGNOSIS — N189 Chronic kidney disease, unspecified: Secondary | ICD-10-CM | POA: Diagnosis not present

## 2020-07-03 DIAGNOSIS — E785 Hyperlipidemia, unspecified: Secondary | ICD-10-CM | POA: Diagnosis not present

## 2020-07-03 DIAGNOSIS — H6691 Otitis media, unspecified, right ear: Secondary | ICD-10-CM | POA: Diagnosis not present

## 2020-07-03 DIAGNOSIS — Z87440 Personal history of urinary (tract) infections: Secondary | ICD-10-CM | POA: Diagnosis not present

## 2020-07-03 DIAGNOSIS — E7800 Pure hypercholesterolemia, unspecified: Secondary | ICD-10-CM | POA: Diagnosis not present

## 2020-07-03 DIAGNOSIS — Z9181 History of falling: Secondary | ICD-10-CM | POA: Diagnosis not present

## 2020-07-03 DIAGNOSIS — J309 Allergic rhinitis, unspecified: Secondary | ICD-10-CM | POA: Diagnosis not present

## 2020-07-03 DIAGNOSIS — G894 Chronic pain syndrome: Secondary | ICD-10-CM | POA: Diagnosis not present

## 2020-07-03 DIAGNOSIS — K219 Gastro-esophageal reflux disease without esophagitis: Secondary | ICD-10-CM | POA: Diagnosis not present

## 2020-07-03 DIAGNOSIS — M21371 Foot drop, right foot: Secondary | ICD-10-CM | POA: Diagnosis not present

## 2020-07-03 DIAGNOSIS — G4700 Insomnia, unspecified: Secondary | ICD-10-CM | POA: Diagnosis not present

## 2020-07-03 DIAGNOSIS — K5900 Constipation, unspecified: Secondary | ICD-10-CM | POA: Diagnosis not present

## 2020-07-03 MED ORDER — AZITHROMYCIN 250 MG PO TABS: ORAL_TABLET | ORAL | 0 refills | Status: DC

## 2020-07-03 NOTE — Telephone Encounter (Signed)
Saw pt husband on virtual visit toda. He has covid and will start antibiotic for possible bronchitis versus pneumonia. Pt wife who was helping out during interview expressed concern she will get sick/similar to her husband.  She wanted me to send in a prescription of azithromycin for her in the event her developing sinus pressure or chest congestion.  Decided to go ahead and send in the azithromycin but explained her not to start the medication unless signs and symptoms indicated so. Patient expressed understanding.  If she does have COVID type symptoms or other infectious type symptoms then  want her to schedule virtual visit with PCP or myself early next week. [place]sea expressed understanding.

## 2020-07-07 ENCOUNTER — Ambulatory Visit (HOSPITAL_COMMUNITY): Payer: Medicare Other

## 2020-07-09 ENCOUNTER — Telehealth: Payer: Self-pay

## 2020-07-09 ENCOUNTER — Ambulatory Visit: Payer: Medicare Other | Admitting: Adult Health

## 2020-07-09 NOTE — Telephone Encounter (Signed)
Signed and put in box to go up front. Signed:  Crissie Sickles, MD           07/09/2020

## 2020-07-09 NOTE — Telephone Encounter (Signed)
Home health orders received 07/09/20 for [facility] health initiation orders: Yes.  Home health re-certification orders: No. Patient last seen by ordering physician for this condition: 05/25/20. Must be less than 90 days for re-certification and less than 30 days prior for initiation. Visit must have been for the condition the orders are being placed.  Patient meets criteria for Physician to sign orders: Yes.        Current med list has been attached: Yes        Orders placed on physicians desk for signature: 07/09/20 (date) If patient does not meet criteria for orders to be signed: pt was called to schedule appt. Appt is scheduled for N/A.   Emilee Hero

## 2020-07-15 ENCOUNTER — Encounter: Payer: Self-pay | Admitting: Family Medicine

## 2020-07-15 ENCOUNTER — Telehealth: Payer: Self-pay | Admitting: Family Medicine

## 2020-07-15 NOTE — Telephone Encounter (Signed)
Patient's son, Nicki Reaper, called to request advice on caring for Alison Cuevas who tested positive for Covid 10 days ago and again today. Nicki Reaper would like information on how long she may be contagious as they are coordinating care arrangements among family members. Please call Scott at [phone].

## 2020-07-16 NOTE — Telephone Encounter (Signed)
A positive repeat test like this can happen but it is hard to know if this means anything regarding ongoing infection or whether she can still spread it, etc.  No need to do any further repeat testing in my opinion.

## 2020-07-16 NOTE — Telephone Encounter (Signed)
Patient advised and voiced understanding.  Advised of home care treatment

## 2020-07-16 NOTE — Telephone Encounter (Signed)
Patient's son advised and voiced understanding.

## 2020-07-28 DIAGNOSIS — Z9889 Other specified postprocedural states: Secondary | ICD-10-CM | POA: Diagnosis not present

## 2020-07-28 DIAGNOSIS — H6123 Impacted cerumen, bilateral: Secondary | ICD-10-CM | POA: Diagnosis not present

## 2020-07-28 DIAGNOSIS — Z8669 Personal history of other diseases of the nervous system and sense organs: Secondary | ICD-10-CM | POA: Diagnosis not present

## 2020-07-28 DIAGNOSIS — H7291 Unspecified perforation of tympanic membrane, right ear: Secondary | ICD-10-CM | POA: Diagnosis not present

## 2020-07-28 DIAGNOSIS — H90A31 Mixed conductive and sensorineural hearing loss, unilateral, right ear with restricted hearing on the contralateral side: Secondary | ICD-10-CM | POA: Diagnosis not present

## 2020-07-29 ENCOUNTER — Telehealth: Payer: Self-pay | Admitting: Family Medicine

## 2020-07-29 NOTE — Telephone Encounter (Signed)
Please advise on v/o 

## 2020-07-29 NOTE — Telephone Encounter (Signed)
Yes all okay

## 2020-07-29 NOTE — Telephone Encounter (Signed)
Sharyn Lull with [facility] called for verbal orders as follows: Effective 08/03/20, extend [facility] OT twice weekly x 2 weeks then once weekly x 2 weeks.  Also, for MSW to assess and provide family with information for in home assistance. Her call back number [phone].

## 2020-07-30 NOTE — Telephone Encounter (Signed)
V/o given 

## 2020-07-31 NOTE — Telephone Encounter (Signed)
Receved faxed orders from [facility]. Placed in Dr. Idelle Leech inbox in front office to be signed.

## 2020-07-31 NOTE — Telephone Encounter (Signed)
Signed and put in box to go up front. Signed:  Crissie Sickles, MD           07/31/2020

## 2020-07-31 NOTE — Telephone Encounter (Signed)
Placed on PCP desk to review and sign, if appropriate.  

## 2020-08-02 ENCOUNTER — Other Ambulatory Visit: Payer: Self-pay | Admitting: Family Medicine

## 2020-08-02 DIAGNOSIS — H9011 Conductive hearing loss, unilateral, right ear, with unrestricted hearing on the contralateral side: Secondary | ICD-10-CM | POA: Diagnosis not present

## 2020-08-02 DIAGNOSIS — N189 Chronic kidney disease, unspecified: Secondary | ICD-10-CM | POA: Diagnosis not present

## 2020-08-02 DIAGNOSIS — K219 Gastro-esophageal reflux disease without esophagitis: Secondary | ICD-10-CM | POA: Diagnosis not present

## 2020-08-02 DIAGNOSIS — M8580 Other specified disorders of bone density and structure, unspecified site: Secondary | ICD-10-CM | POA: Diagnosis not present

## 2020-08-02 DIAGNOSIS — Z7982 Long term (current) use of aspirin: Secondary | ICD-10-CM | POA: Diagnosis not present

## 2020-08-02 DIAGNOSIS — M21371 Foot drop, right foot: Secondary | ICD-10-CM | POA: Diagnosis not present

## 2020-08-02 DIAGNOSIS — M1990 Unspecified osteoarthritis, unspecified site: Secondary | ICD-10-CM | POA: Diagnosis not present

## 2020-08-02 DIAGNOSIS — Z87440 Personal history of urinary (tract) infections: Secondary | ICD-10-CM | POA: Diagnosis not present

## 2020-08-02 DIAGNOSIS — F4323 Adjustment disorder with mixed anxiety and depressed mood: Secondary | ICD-10-CM | POA: Diagnosis not present

## 2020-08-02 DIAGNOSIS — K5790 Diverticulosis of intestine, part unspecified, without perforation or abscess without bleeding: Secondary | ICD-10-CM | POA: Diagnosis not present

## 2020-08-02 DIAGNOSIS — H6691 Otitis media, unspecified, right ear: Secondary | ICD-10-CM | POA: Diagnosis not present

## 2020-08-02 DIAGNOSIS — I129 Hypertensive chronic kidney disease with stage 1 through stage 4 chronic kidney disease, or unspecified chronic kidney disease: Secondary | ICD-10-CM | POA: Diagnosis not present

## 2020-08-02 DIAGNOSIS — K5900 Constipation, unspecified: Secondary | ICD-10-CM | POA: Diagnosis not present

## 2020-08-02 DIAGNOSIS — M7061 Trochanteric bursitis, right hip: Secondary | ICD-10-CM | POA: Diagnosis not present

## 2020-08-02 DIAGNOSIS — G4700 Insomnia, unspecified: Secondary | ICD-10-CM | POA: Diagnosis not present

## 2020-08-02 DIAGNOSIS — J309 Allergic rhinitis, unspecified: Secondary | ICD-10-CM | POA: Diagnosis not present

## 2020-08-02 DIAGNOSIS — Z9181 History of falling: Secondary | ICD-10-CM | POA: Diagnosis not present

## 2020-08-02 DIAGNOSIS — M5030 Other cervical disc degeneration, unspecified cervical region: Secondary | ICD-10-CM | POA: Diagnosis not present

## 2020-08-02 DIAGNOSIS — E7800 Pure hypercholesterolemia, unspecified: Secondary | ICD-10-CM | POA: Diagnosis not present

## 2020-08-02 DIAGNOSIS — E871 Hypo-osmolality and hyponatremia: Secondary | ICD-10-CM | POA: Diagnosis not present

## 2020-08-02 DIAGNOSIS — M797 Fibromyalgia: Secondary | ICD-10-CM | POA: Diagnosis not present

## 2020-08-02 DIAGNOSIS — E785 Hyperlipidemia, unspecified: Secondary | ICD-10-CM | POA: Diagnosis not present

## 2020-08-02 DIAGNOSIS — G894 Chronic pain syndrome: Secondary | ICD-10-CM | POA: Diagnosis not present

## 2020-08-02 DIAGNOSIS — I69153 Hemiplegia and hemiparesis following nontraumatic intracerebral hemorrhage affecting right non-dominant side: Secondary | ICD-10-CM | POA: Diagnosis not present

## 2020-08-11 ENCOUNTER — Telehealth: Payer: Self-pay

## 2020-08-11 NOTE — Telephone Encounter (Signed)
Received fax from [facility] regarding MSW evaluation, POC effective 08/02/20 1wk1, 1q2wk2. Placed on PCP desk to review and sign, if appropriate.

## 2020-08-11 NOTE — Telephone Encounter (Signed)
Signed and put in box to go up front. Signed:  Crissie Sickles, MD           08/11/2020

## 2020-08-12 ENCOUNTER — Telehealth: Payer: Self-pay | Admitting: Family Medicine

## 2020-08-12 ENCOUNTER — Other Ambulatory Visit: Payer: Self-pay

## 2020-08-12 ENCOUNTER — Encounter: Payer: Self-pay | Admitting: Physical Medicine & Rehabilitation

## 2020-08-12 ENCOUNTER — Encounter: Payer: Medicare Other | Attending: Physical Medicine & Rehabilitation | Admitting: Physical Medicine & Rehabilitation

## 2020-08-12 VITALS — BP 128/75 | HR 83 | Temp 97.9°F | Ht 60.0 in | Wt 126.0 lb

## 2020-08-12 DIAGNOSIS — I611 Nontraumatic intracerebral hemorrhage in hemisphere, cortical: Secondary | ICD-10-CM | POA: Insufficient documentation

## 2020-08-12 DIAGNOSIS — G8111 Spastic hemiplegia affecting right dominant side: Secondary | ICD-10-CM | POA: Insufficient documentation

## 2020-08-12 NOTE — Patient Instructions (Signed)
TRY TO SYNC THE END OF HOME THERAPIES WITH THE BEGINNING OF OUTPATIENT THERAPIES. ASK THE HOME HEALTH THERAPIST WHEN YOU FINISH AND WHEN [place] CALLS YOU TO SCHEDULE THERAPY, YOU KNOW WHEN TO START.  BRING BOTH OF YOUR BRACES WITH YOU TO OUTPATIENT THERAPIES.

## 2020-08-12 NOTE — Telephone Encounter (Signed)
Faxed signed orders to [facility]. Fax confirmation received.

## 2020-08-12 NOTE — Telephone Encounter (Signed)
Received faxed orders from [facility]. Placed in Dr. Idelle Leech inbox in front office to be signed.

## 2020-08-12 NOTE — Progress Notes (Signed)
Subjective:    Patient ID: Alison Cuevas, female    DOB: [birth year], 75 y.o.   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 We performed botox injections in December to her right biceps and right plantar flexors. She is working on her exercises and stretches daily. Her right arm is much improved although she still c/o tightness in her right ankle. She is receiving HH therapy at the house who is working on gait, rom, balance. She is using a walker at home with therapy. She's in w/c for longer distances.   She is anxious to get into her yard and to resume some of the activities she used to take part in. She likes to work in the yard especially.   She suffered from [place] in late January and was in the ED for hypotension over the holidays.     From  Pain Inventory Average Pain 6 Pain Right Now 6 My pain is aching  In the last 24 hours, has pain interfered with the following? General activity 1 Relation with others 1 Enjoyment of life 1 What TIME of day is your pain at its worst? morning , daytime, evening and night Sleep (in general) Fair  Pain is worse with: walking, bending, sitting, standing and some activites Pain improves with: rest and medication Relief from Meds: 8  Family History  Problem Relation Age of Onset  . Hypertension Mother   . Diverticulitis Mother   . Breast cancer Sister 58  . Melanoma Brother   . Prostate cancer Brother   . Leukemia Brother   . Lupus Sister   . Lung disease Brother   . Stomach cancer Father   . Other Other        Family member with MGUS   Social History   Socioeconomic History  . Marital status: Married    Spouse name: [name]  . Number of children: 3  . Years of education: Not on file  . Highest education level: Not on file  Occupational History  . Occupation: retired  Tobacco Use  . Smoking status: Never Smoker  . Smokeless tobacco: Never  Used  Vaping Use  . Vaping Use: Never used  Substance and Sexual Activity  . Alcohol use: No    Alcohol/week: 0.0 standard drinks  . Drug use: No  . Sexual activity: Never    Birth control/protection: Surgical    Comment: 1st intercourse 25 yo-1 partner  Other Topics Concern  . Not on file  Social History Narrative   Married, has 3 children (one lives near her).  Has 8 grandchildren, 2 greatgrandchildren.   Worked cleaning apartments, worked at [employer], was a Secretary/administrator.   She retired at age 33.   No tobacco.  No alcohol.  No drugs.   Exercise: clean, work in yard.        10 siblings in all--5 have passed away--1 child age 37 with whooping cough , 1 lupus, 1 melanoma,1 lung cancer, 1 pulm fibrosis   Social Determinants of Health   Financial Resource Strain: Not on file  Food Insecurity: Not on file  Transportation Needs: Not on file  Physical Activity: Not on file  Stress: Not on file  Social Connections: Not on file   Past Surgical History:  Procedure Laterality Date  . ABDOMINAL HYSTERECTOMY  03/2015   TAH.  Last pap 2016.  No hx of abnl paps.  Per GYN, no further pap smears indicated.  . ANTERIOR AND POSTERIOR REPAIR N/A 03/18/2014   Procedure: Cystocele repair with graft, Vault suspension, Rectocele repair;  Surgeon: [name], MD;  Location: [facility];  Service: Urology;  Laterality: N/A;  . CHOLECYSTECTOMY    . CHOLECYSTECTOMY, LAPAROSCOPIC    . COLON SURGERY    . COLONOSCOPY  04/2006; 05/26/16   2007 (Dr. [name]): Normal.  04/2016 (Dr. [name]) normal except diverticulosis and decreased anal sphincter tone.  No repeat colonoscopy is recommended due to age.  . cspine surgery     Dr. Wiliam Ke level ant cerv discectomy /fusion w/plating  . CYSTO N/A 03/18/2014   Procedure: CYSTO;  Surgeon: [name], MD;  Location: [facility];  Service: Urology;  Laterality: N/A;  . DEXA  07/30/2015; 06/2018   2017 and 2020 -->Osteopenia--repeat 2 yrs.  Marland Kitchen LYSIS OF  ADHESION N/A 01/31/2018   Procedure: POSSIBLE LYSIS OF ADHESION;  Surgeon: Everitt Amber, MD;  Location: [facility];  Service: Gynecology;  Laterality: N/A;  . [place]     with prolasped bledder repair  . RESECTION OF COLON     BENIGN TUMOR  . RIGHT EAR SURGERY  08/23/2017   Dr. 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 right ear surgery  12/12/2018   Right tympanomastoidectomy, ossiculoplasty with partial prosthesis, split thickness skin graft from postauricular skin 1x1cm, and right tragal cartilage graft 1x1 cm [facility])  . right hemicolectomy for diverticulitis with abscess  1993  . ROBOTIC ASSISTED BILATERAL SALPINGO OOPHERECTOMY Bilateral 01/31/2018   All PATH benign.  Procedure: XI ROBOTIC ASSISTED BILATERAL SALPINGO OOPHORECTOMY;  Surgeon: Everitt Amber, MD;  Location: [facility];  Service: Gynecology;  Laterality: Bilateral;  . TRANSTHORACIC ECHOCARDIOGRAM  01/27/2020   EF 65-70%, NORMAL.  . TUBAL LIGATION     Past Surgical History:  Procedure Laterality Date  . ABDOMINAL HYSTERECTOMY  03/2015   TAH.  Last pap 2016.  No hx of abnl paps.  Per GYN, no further pap smears indicated.  . ANTERIOR AND POSTERIOR REPAIR N/A 03/18/2014   Procedure: Cystocele repair with graft, Vault suspension, Rectocele repair;  Surgeon: [name], MD;  Location: [facility];  Service: Urology;  Laterality: N/A;  . CHOLECYSTECTOMY    . CHOLECYSTECTOMY, LAPAROSCOPIC    . COLON SURGERY    . COLONOSCOPY  04/2006; 05/26/16   2007 (Dr. [name]): Normal.  04/2016 (Dr. [name]) normal except diverticulosis and decreased anal sphincter tone.  No repeat colonoscopy is recommended due to age.  . cspine surgery     Dr. Wiliam Ke level ant cerv discectomy /fusion w/plating  . CYSTO N/A 03/18/2014   Procedure: CYSTO;  Surgeon: [name], MD;  Location: [facility];  Service: Urology;   Laterality: N/A;  . DEXA  07/30/2015; 06/2018   2017 and 2020 -->Osteopenia--repeat 2 yrs.  Marland Kitchen LYSIS OF ADHESION N/A 01/31/2018   Procedure: POSSIBLE LYSIS OF ADHESION;  Surgeon: Everitt Amber, MD;  Location: [facility];  Service: Gynecology;  Laterality: N/A;  . [place]     with prolasped bledder repair  . RESECTION OF COLON     BENIGN TUMOR  . RIGHT EAR SURGERY  08/23/2017   Dr. 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  right ear surgery  12/12/2018   Right tympanomastoidectomy, ossiculoplasty with partial prosthesis, split thickness skin graft from postauricular skin 1x1cm, and right tragal cartilage graft 1x1 cm [facility])  . right hemicolectomy for diverticulitis with abscess  1993  . ROBOTIC ASSISTED BILATERAL SALPINGO OOPHERECTOMY Bilateral 01/31/2018   All PATH benign.  Procedure: XI ROBOTIC ASSISTED BILATERAL SALPINGO OOPHORECTOMY;  Surgeon: Everitt Amber, MD;  Location: [facility];  Service: Gynecology;  Laterality: Bilateral;  . TRANSTHORACIC ECHOCARDIOGRAM  01/27/2020   EF 65-70%, NORMAL.  . TUBAL LIGATION     Past Medical History:  Diagnosis Date  . Allergic rhinitis    maple pollen  . Anxiety   . Arthritis   . Bowel obstruction ([place])   . Chronic low back pain    Dr. Trenton Gammon.  Has had back injection--bp elevated after.  . Chronic pain syndrome   . Chronic renal insufficiency, stage 2 (mild)    GFR 60-70  . DDD (degenerative disc disease), cervical    Hx of ACDF (Dr. [name]).  Followed by Dr. [name].  Also, Dr. [name] do left C7-T1 intralaminal epidural injection.  . Diverticulosis of colon   . DJD (degenerative joint disease)   . Gallstones   . GERD (gastroesophageal reflux disease)   . Herpes zoster 07/08/2014  . 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  Resid R arm and leg weakness + flexion contractures.  . Hypercholesteremia    mild; pt declined statin trial 05/2014--needs recheck lipid panel at first f/u visit in 2017  . Hypertension   . Osteopenia 06/2017   T score -1.4 FRAX 15% / 2%  . Ovarian cyst 2017   82019-robotic assisted bilatel SPO: all path benign.  . Perianal dermatitis    prn cutivate  . Phlebitis   . 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 Right knee injury 2018   Patellofemoral crush injury--Dr. [name].  . Trochanteric bursitis of right hip    Recurrent (injection by Dr. [name] 05/26/15)  . Tympanic membrane rupture, right 12/01/2016   As of 08/2018 pt set for tympanomastoidectomy and STSG (Dr. [name]). Recurrent fungal/bact OE.  . Varicose vein    left leg    BP 128/75   Pulse 83   Temp 97.9 F (36.6 C)   Ht 5' (1.524 m)   Wt 126 lb (57.2 kg)   LMP 06/27/1972   SpO2 94%   BMI 24.61 kg/m   Opioid Risk Score:   Fall Risk Score:  `1  Depression screen PHQ 2/9  Depression screen [facility] 2/9 06/10/2020 03/04/2020 11/02/2018 01/26/2018 12/19/2017 12/05/2016  Decreased Interest 2 2 0 0 0 0  Down, Depressed, Hopeless 2 2 0 0 0 0  PHQ - 2 Score 4 4 0 0 0 0  Altered sleeping - - 0 - 0 -  Tired, decreased energy - - 0 - 0 -  Change in appetite - - 0 - 0 -  Feeling bad or failure about yourself  - - 0 - 0 -  Trouble concentrating - - 0 - 0 -  Moving slowly or fidgety/restless - - 0 - 0 -  Suicidal thoughts - - 0 - 0 -  PHQ-9 Score - - 0 - 0 -  Difficult doing work/chores - - Not difficult at all - - -  Some recent data might be hidden     Review of  Systems  Musculoskeletal:       Leg, foot, hip, arm numbness  Neurological: Positive for numbness.  All other systems reviewed and are negative.      Objective:   Physical Exam General: No acute distress HEENT: EOMI, oral membranes moist Cards: reg rate  Chest: normal effort Abdomen: Soft, NT,  ND Skin: dry, intact Extremities: no edema Psych: pleasant and appropriate Skin: intact Neuro: Alert and oriented x 3. Normal insight and awareness. Intact Memory. Normal language and speech. Cranial nerve exam unremarkable. RUE 4+/5 with minimal resting. RLE 4/5 except for right heel cord tightness. Tone 3/4 Right gastrocs and tib posterior Musculoskeletal: minimal pain with ROM, heel cord tight      Assessment & Plan:  1. 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             -transition from [facility] to outpt therapies.             -reviewed AFO with pt--would like her to bring both to therapy  -continue with HEP as well. 2.     HTN:                per primary          3. Spasticity:  -will bring back for botox 400u right gastrocs and tibialis posterior    4 History of small volume rectal bleeding likely traumatic due to hard stool             - moving bowels regularly             -f/u per primary       15 minutes of face to face patient care time were spent during this visit. All questions were encouraged and answered.  Follow up with me in 4-6 weeks for botox .

## 2020-08-12 NOTE — Telephone Encounter (Signed)
Placed on PCP desk to review and sign, if appropriate.  

## 2020-08-12 NOTE — Telephone Encounter (Signed)
Signed and put in box to go up front. Signed:  Crissie Sickles, MD           08/12/2020

## 2020-08-13 NOTE — Telephone Encounter (Signed)
Faxed signed form to [facility]. Fax confirmation received.

## 2020-08-19 ENCOUNTER — Ambulatory Visit (HOSPITAL_COMMUNITY): Payer: Medicare Other

## 2020-08-19 ENCOUNTER — Ambulatory Visit (HOSPITAL_COMMUNITY): Payer: Medicare Other | Admitting: Physical Therapy

## 2020-08-30 ENCOUNTER — Other Ambulatory Visit: Payer: Self-pay | Admitting: Family Medicine

## 2020-08-31 NOTE — Telephone Encounter (Signed)
Rx sent 

## 2020-08-31 NOTE — Telephone Encounter (Signed)
Requesting: alprazolam Contract: 03/06/19 UDS: 01/26/20, hospital & 04/03/19 PCP office Last Visit:05/25/20 Next Visit: advised to f/u 1 week Last Refill: 01/10/20(90,5)  Please Advise. Medication pending

## 2020-09-02 ENCOUNTER — Encounter (HOSPITAL_COMMUNITY): Payer: Self-pay

## 2020-09-02 ENCOUNTER — Other Ambulatory Visit: Payer: Self-pay

## 2020-09-02 ENCOUNTER — Ambulatory Visit (HOSPITAL_COMMUNITY): Payer: Medicare Other | Attending: Physical Medicine & Rehabilitation

## 2020-09-02 ENCOUNTER — Encounter (HOSPITAL_COMMUNITY): Payer: Self-pay | Admitting: Physical Therapy

## 2020-09-02 ENCOUNTER — Ambulatory Visit (HOSPITAL_COMMUNITY): Payer: Medicare Other | Admitting: Physical Therapy

## 2020-09-02 DIAGNOSIS — M6281 Muscle weakness (generalized): Secondary | ICD-10-CM

## 2020-09-02 DIAGNOSIS — R2689 Other abnormalities of gait and mobility: Secondary | ICD-10-CM | POA: Diagnosis not present

## 2020-09-02 DIAGNOSIS — R278 Other lack of coordination: Secondary | ICD-10-CM | POA: Diagnosis not present

## 2020-09-02 DIAGNOSIS — R29898 Other symptoms and signs involving the musculoskeletal system: Secondary | ICD-10-CM | POA: Diagnosis not present

## 2020-09-02 NOTE — Patient Instructions (Signed)
Occupational Therapy Home exercise program.  Complete 2-3 times a day if possible.    Both arms straight out in front of you, keep your elbows straight do not let them bend, the criss/cross your arms in front of you. 10 times.   Both arms straight out in front of you, keep elbows straight.  Bend your wrist back and make small circles in the air.  Go in one direction the go in the other (Like you are waxing something) 10 times.   Both arms straight out in front of you, keep elbows straight.  Bend your wrists back and move your arm up while the left arm goes down as quick as you can. 10 times.   With your right arm straight out in front of you, keep your elbow straight.  Write your name in the air 3-4 times quickly.

## 2020-09-02 NOTE — Therapy (Signed)
[place], [place], [address] Phone: [phone]   Fax:  [phone]  Occupational Therapy Evaluation  Patient Details  Name: Alison Cuevas MRN: [id number] Date of Birth: 1946/02/21 Referring Provider (OT): Alger Simons, MD   Encounter Date: 09/02/2020   OT End of Session - 09/02/20 1113    Visit Number 1    Number of Visits 12    Date for OT Re-Evaluation 10/14/20    Authorization Type double coverage Medicare 80/20% secondary: [employer] Covers what medicare doesn't.    Progress Note Due on Visit 10    OT Start Time [phone]    OT Stop Time 1040    OT Time Calculation (min) 50 min    Activity Tolerance Patient tolerated treatment well    Behavior During Therapy [facility] for tasks assessed/performed           Past Medical History:  Diagnosis Date  . Allergic rhinitis    maple pollen  . Anxiety   . Arthritis   . Bowel obstruction ([place])   . Chronic low back pain    Dr. Trenton Gammon.  Has had back injection--bp elevated after.  . Chronic pain syndrome   . Chronic renal insufficiency, stage 2 (mild)    GFR 60-70  . DDD (degenerative disc disease), cervical    Hx of ACDF (Dr. [name]).  Followed by Dr. [name].  Also, Dr. [name] do left C7-T1 intralaminal epidural injection.  . Diverticulosis of colon   . DJD (degenerative joint disease)   . Gallstones   . GERD (gastroesophageal reflux disease)   . Herpes zoster 07/08/2014  . 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  Resid R arm and leg weakness + flexion contractures.  . Hypercholesteremia    mild; pt declined statin trial 05/2014--needs recheck lipid panel at first f/u visit in 2017  . Hypertension   . Osteopenia 06/2017   T score -1.4 FRAX 15% / 2%  . Ovarian cyst 2017   82019-robotic assisted bilatel SPO: all path benign.  . Perianal dermatitis    prn cutivate  .  Phlebitis   . Right hemiplegia ([place]) 01/2020   R arm and leg (hemorrhagic L cerebral hemisphere CVA)  . Right knee injury 2018   Patellofemoral crush injury--Dr. [name].  . Trochanteric bursitis of right hip    Recurrent (injection by Dr. [name] 05/26/15)  . Tympanic membrane rupture, right 12/01/2016   As of 08/2018 pt set for tympanomastoidectomy and STSG (Dr. [name]). Recurrent fungal/bact OE.  . Varicose vein    left leg     Past Surgical History:  Procedure Laterality Date  . ABDOMINAL HYSTERECTOMY  03/2015   TAH.  Last pap 2016.  No hx of abnl paps.  Per GYN, no further pap smears indicated.  . ANTERIOR AND POSTERIOR REPAIR N/A 03/18/2014   Procedure: Cystocele repair with graft, Vault suspension, Rectocele repair;  Surgeon: [name], MD;  Location: [facility];  Service: Urology;  Laterality: N/A;  . CHOLECYSTECTOMY    . CHOLECYSTECTOMY, LAPAROSCOPIC    . COLON SURGERY    . COLONOSCOPY  04/2006; 05/26/16   2007 (Dr. [name]): Normal.  04/2016 (Dr. [name]) normal except diverticulosis and decreased anal sphincter tone.  No repeat colonoscopy is recommended due to age.  . cspine surgery     Dr. Wiliam Ke level ant cerv discectomy /fusion w/plating  . CYSTO N/A 03/18/2014   Procedure:  CYSTO;  Surgeon: [name], MD;  Location: [facility];  Service: Urology;  Laterality: N/A;  . DEXA  07/30/2015; 06/2018   2017 and 2020 -->Osteopenia--repeat 2 yrs.  Marland Kitchen LYSIS OF ADHESION N/A 01/31/2018   Procedure: POSSIBLE LYSIS OF ADHESION;  Surgeon: Everitt Amber, MD;  Location: [facility];  Service: Gynecology;  Laterality: N/A;  . [place]     with prolasped bledder repair  . RESECTION OF COLON     BENIGN TUMOR  . RIGHT EAR SURGERY  08/23/2017   Dr. 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 right ear surgery  12/12/2018   Right  tympanomastoidectomy, ossiculoplasty with partial prosthesis, split thickness skin graft from postauricular skin 1x1cm, and right tragal cartilage graft 1x1 cm [facility])  . right hemicolectomy for diverticulitis with abscess  1993  . ROBOTIC ASSISTED BILATERAL SALPINGO OOPHERECTOMY Bilateral 01/31/2018   All PATH benign.  Procedure: XI ROBOTIC ASSISTED BILATERAL SALPINGO OOPHORECTOMY;  Surgeon: Everitt Amber, MD;  Location: [facility];  Service: Gynecology;  Laterality: Bilateral;  . TRANSTHORACIC ECHOCARDIOGRAM  01/27/2020   EF 65-70%, NORMAL.  . TUBAL LIGATION      There were no vitals filed for this visit.   Subjective Assessment - 09/02/20 1052    Subjective  S: My right arm needs some work    Patient is accompanied by: Family member   Son Alison Cuevas            [facility] OT Assessment - 09/02/20 0959      Assessment   Medical Diagnosis Right side weakness S/P CVA    Referring Provider (OT) Alger Simons, MD    Onset Date/Surgical Date 02/14/21    Hand Dominance Left    Next MD Visit 10/14/20    Prior Therapy Pt received OT and PT services at [facility], discharged to SNF, discharged home and received Home Health therapy.      Precautions   Precautions Fall      Restrictions   Weight Bearing Restrictions No      Balance Screen   Has the patient fallen in the past 6 months Yes    How many times? 1    Has the patient had a decrease in activity level because of a fear of falling?  No    Is the patient reluctant to leave their home because of a fear of falling?  No      Home  Environment   Family/patient expects to be discharged to: Private residence    Living Arrangements Other relatives    Available Help at Discharge Family   and friend   Type of [place];Door    Research scientist (medical) - 2 wheels;Transport chair;Wheelchair - manual;Grab bars - tub/shower;Shower seat;Grab bars - toilet;Hand held shower head   shower chair with  back   Additional Comments 5 inch step to get into the shower. Husband requires physical assistance also and is limited more than patient.      Prior Function   Level of Independence Independent      ADL   Equipment Used Reacher    ADL comments Pt reports difficulty with fine motor tasks, lower body dressing requires increased time although she is able to complete without assistance. Since home health OT has finished she sponge bathes instead of  taking a shower unless family is present. Difficulty with rolling hair. RUE shakes when completing precise fine motor tasks.      Mobility   Mobility Status History of falls      Written Expression   Dominant Hand Left      Vision - History   Baseline Vision Bifocals   Does not use her glasses on a regular basis. Reports that her left eye has been noticably worse recently.   Visual History Cataracts   possible in the left eye   Additional Comments Visual deficits from CVA have resolved.      Cognition   Overall Cognitive Status Within Functional Limits for tasks assessed      Observation/Other Assessments   Focus on Therapeutic Outcomes (FOTO)  complete at next session      Sensation   Light Touch Not tested    Stereognosis Not tested    Hot/Cold Not tested    Proprioception Impaired by gross assessment    Additional Comments Pt reports that her sensation feels different on the right side than the left. Decrease proprioception noted when attempting to position right arm per OT's direction and required hands on positioning. Needs further testing in all areas.      Coordination   Gross Motor Movements are Fluid and Coordinated No    Fine Motor Movements are Fluid and Coordinated No    9 Hole Peg Test Right;Left    Right 9 Hole Peg Test 49.6"    Left 9 Hole Peg Test 26.3"    Tremors Slight tremor noted in right upper extremity during 9 hole peg test.      Tone   Assessment Location Other (comment)      Tone Assessment - Other    Other Tone Location Comments No tone of RUE noted during ROM assessment.      ROM / Strength   AROM / PROM / Strength AROM;PROM;Strength      AROM   Overall AROM  Within functional limits for tasks performed    Overall AROM Comments RUE shoulder, elbow, wrist, and hand.    AROM Assessment Site --      PROM   Overall PROM  Within functional limits for tasks performed    Overall PROM Comments RUE shoulder, elbow, forearm, wrist, and hand      Strength   Overall Strength Comments Assessed seated. IR/er adducted    Strength Assessment Site Shoulder;Hand;Elbow;Forearm;Wrist    Right/Left Shoulder Right    Right Shoulder Flexion 4/5    Right Shoulder ABduction 4+/5    Right Shoulder Internal Rotation 5/5    Right Shoulder External Rotation 3+/5    Right/Left Elbow Right    Right Elbow Flexion 4-/5    Right Elbow Extension 5/5    Right/Left Forearm Right    Right Forearm Pronation 5/5    Right Forearm Supination 5/5    Right/Left Wrist Right    Right Wrist Flexion 5/5    Right Wrist Extension 5/5    Right/Left hand Right;Left    Right Hand Grip (lbs) 40    Right Hand Lateral Pinch 9 lbs    Right Hand 3 Point Pinch 8 lbs    Left Hand Grip (lbs) 60    Left Hand Lateral Pinch 13 lbs    Left Hand 3 Point Pinch 14 lbs  OT Education - 09/02/20 1112    Education Details Discussed results of evaluation: decreased shoulder and scapular stability, decreased hand strength and coordination. Provided proximal shoulder strengthening exercises for HEP to start.    Person(s) Educated Patient;Child(ren)    Methods Explanation;Demonstration;Verbal cues;Handout    Comprehension Returned demonstration;Verbalized understanding            OT Short Term Goals - 09/02/20 1350      OT SHORT TERM GOAL #1   Title Pt will be educated and independent with HEP in order to faciliate progress in therapy and work towards increasing her RUE strength and  coordination while increasing her functional performance during ADL tasks.    Time 3    Period Weeks    Status New    Target Date 09/23/20             OT Long Term Goals - 09/02/20 1352      OT LONG TERM GOAL #1   Title Patient will increase right shoulder strength and elbow to 5/5 while demonstrating increased shoulder and scapular stability when reaching away from her body/base of support.    Time 6    Period Weeks    Status New    Target Date 10/14/20      OT LONG TERM GOAL #2   Title Patient will increase right hand coordination by completing the 9 hole peg test in 30 seconds or less in order to roll hair with less difficulty.    Time 6    Period Weeks    Status New      OT LONG TERM GOAL #3   Title Patient will increase right hand grip strength by 10# and pinch strength by 5# in order to increase ability to grasp and manipulate items with less difficulty.    Time 6    Period Weeks    Status New      OT LONG TERM GOAL #4   Title patient will reach highest level of independence with increased confidence and safety awareness while completing all ADL tasks at modified independent level or better while utilizing adaptive equipment and/or compensatory strategies if needed.    Baseline 3/9: Pt is sponge bathing and does not take a shower unless someone is present for supervision.    Time 6    Period Weeks    Status New                 Plan - 09/02/20 1344    Clinical Impression Statement A: Patient is a 75 y/o female S/P Left CVA while experiencing right side weakness, decreased coordination, hand strength, and ADL performance. Patient received botox injection on 06/10/20 to the biceps and tibialis posterior.    OT Occupational Profile and History Problem Focused Assessment - Including review of records relating to presenting problem    Occupational performance deficits (Please refer to evaluation for details): ADL's    Body Structure / Function / Physical Skills  ADL;UE functional use;FMC;ROM;Proprioception;Coordination;GMC;Strength    Rehab Potential Excellent    Clinical Decision Making Limited treatment options, no task modification necessary    Comorbidities Affecting Occupational Performance: May have comorbidities impacting occupational performance    Modification or Assistance to Complete Evaluation  No modification of tasks or assist necessary to complete eval    OT Frequency 2x / week    OT Duration 6 weeks    OT Treatment/Interventions Self-care/ADL training;Ultrasound;Patient/family education;Visual/perceptual remediation/compensation;DME and/or AE instruction;Passive range of motion;Cryotherapy;Electrical Stimulation;Moist Heat;Neuromuscular education;Therapeutic activities;Manual Therapy;Therapeutic  exercise;Splinting    Plan P: Patient will benefit from skilled OT services to increase functional performance of RUE whlie completing daily self care tasks. Treatment plan: NM re-education, fine and gross motor coordination, proximal shoulder strengthening, right shoulder external rotation, tricep strengthening, hand strength. FOTO next session    OT Home Exercise Plan Eval: proximal shoulder strengthening    Consulted and Agree with Plan of Care Patient           Patient will benefit from skilled therapeutic intervention in order to improve the following deficits and impairments:   Body Structure / Function / Physical Skills: ADL,UE functional use,FMC,ROM,Proprioception,Coordination,GMC,Strength       Visit Diagnosis: Other lack of coordination - Plan: Ot plan of care cert/re-cert  Other symptoms and signs involving the musculoskeletal system - Plan: Ot plan of care cert/re-cert    Problem List Patient Active Problem List   Diagnosis Date Noted  . Pain in joint of right foot 04/15/2020  . Right spastic hemiparesis ([place]) 03/04/2020  . Chronic otitis externa of right ear 02/20/2020  . Intraparenchymal hemorrhage of brain ([place])   .  Delirium   . 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 Stroke (cerebrum) ([place]) 01/26/2020  . Conductive hearing loss of right ear with unrestricted hearing of left ear 05/28/2019  . Hyponatremia 08/31/2017  . Generalized weakness 08/31/2017  . Dehydration 08/31/2017  . UTI (urinary tract infection) 08/31/2017  . Tympanic membrane rupture, right 12/01/2016  . Right ovarian cyst 08/22/2016  . Fibromyalgia 06/03/2015  . Cystocele 03/18/2014  . 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 Diverticulosis of colon without hemorrhage 06/12/2013  . Hypertension   . Osteopenia   . Chronic low back pain 05/12/2011  . HYPERCHOLESTEROLEMIA 03/17/2008  . Anxiety state 09/17/2007  . GERD 09/17/2007  . DJD (degenerative joint disease) 09/17/2007    Ailene Ravel, OTR/L,CBIS  [phone]  09/02/2020, 2:08 PM  [place] [address] [place], [place], [address] Phone: [phone]   Fax:  [phone]  Name: Comfort Iversen MRN: [id number] Date of Birth: August 17, 1945

## 2020-09-02 NOTE — Therapy (Signed)
[place], [place], [address] Phone: [phone]   Fax:  [phone]  Physical Therapy Evaluation  Patient Details  Name: Alison Cuevas MRN: [id number] Date of Birth: Sep 01, 1945 Referring Provider (PT): Alger Simons MD   Encounter Date: 09/02/2020   PT End of Session - 09/02/20 1603    Visit Number 1    Number of Visits 16    Date for PT Re-Evaluation 10/28/20    Authorization Type Medicare A/ [place] NAP 2ndary    Progress Note Due on Visit 10    PT Start Time 0900    PT Stop Time 0945    PT Time Calculation (min) 45 min    Activity Tolerance Patient tolerated treatment well    Behavior During Therapy [facility] for tasks assessed/performed           Past Medical History:  Diagnosis Date  . Allergic rhinitis    maple pollen  . Anxiety   . Arthritis   . Bowel obstruction ([place])   . Chronic low back pain    Dr. Trenton Gammon.  Has had back injection--bp elevated after.  . Chronic pain syndrome   . Chronic renal insufficiency, stage 2 (mild)    GFR 60-70  . DDD (degenerative disc disease), cervical    Hx of ACDF (Dr. [name]).  Followed by Dr. [name].  Also, Dr. [name] do left C7-T1 intralaminal epidural injection.  . Diverticulosis of colon   . DJD (degenerative joint disease)   . Gallstones   . GERD (gastroesophageal reflux disease)   . Herpes zoster 07/08/2014  . 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  Resid R arm and leg weakness + flexion contractures.  . Hypercholesteremia    mild; pt declined statin trial 05/2014--needs recheck lipid panel at first f/u visit in 2017  . Hypertension   . Osteopenia 06/2017   T score -1.4 FRAX 15% / 2%  . Ovarian cyst 2017   82019-robotic assisted bilatel SPO: all path benign.  . Perianal dermatitis    prn cutivate  . Phlebitis   . Right hemiplegia ([place]) 01/2020   R arm and leg  (hemorrhagic L cerebral hemisphere CVA)  . Right knee injury 2018   Patellofemoral crush injury--Dr. [name].  . Trochanteric bursitis of right hip    Recurrent (injection by Dr. [name] 05/26/15)  . Tympanic membrane rupture, right 12/01/2016   As of 08/2018 pt set for tympanomastoidectomy and STSG (Dr. [name]). Recurrent fungal/bact OE.  . Varicose vein    left leg     Past Surgical History:  Procedure Laterality Date  . ABDOMINAL HYSTERECTOMY  03/2015   TAH.  Last pap 2016.  No hx of abnl paps.  Per GYN, no further pap smears indicated.  . ANTERIOR AND POSTERIOR REPAIR N/A 03/18/2014   Procedure: Cystocele repair with graft, Vault suspension, Rectocele repair;  Surgeon: [name], MD;  Location: [facility];  Service: Urology;  Laterality: N/A;  . CHOLECYSTECTOMY    . CHOLECYSTECTOMY, LAPAROSCOPIC    . COLON SURGERY    . COLONOSCOPY  04/2006; 05/26/16   2007 (Dr. [name]): Normal.  04/2016 (Dr. [name]) normal except diverticulosis and decreased anal sphincter tone.  No repeat colonoscopy is recommended due to age.  . cspine surgery     Dr. Wiliam Ke level ant cerv discectomy /fusion w/plating  . CYSTO N/A 03/18/2014   Procedure: CYSTO;  Surgeon: Elayne Snare  MacDiarmid, MD;  Location: [facility];  Service: Urology;  Laterality: N/A;  . DEXA  07/30/2015; 06/2018   2017 and 2020 -->Osteopenia--repeat 2 yrs.  Marland Kitchen LYSIS OF ADHESION N/A 01/31/2018   Procedure: POSSIBLE LYSIS OF ADHESION;  Surgeon: Everitt Amber, MD;  Location: [facility];  Service: Gynecology;  Laterality: N/A;  . [place]     with prolasped bledder repair  . RESECTION OF COLON     BENIGN TUMOR  . RIGHT EAR SURGERY  08/23/2017   Dr. 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 right ear surgery  12/12/2018   Right tympanomastoidectomy, ossiculoplasty with partial prosthesis, split thickness  skin graft from postauricular skin 1x1cm, and right tragal cartilage graft 1x1 cm [facility])  . right hemicolectomy for diverticulitis with abscess  1993  . ROBOTIC ASSISTED BILATERAL SALPINGO OOPHERECTOMY Bilateral 01/31/2018   All PATH benign.  Procedure: XI ROBOTIC ASSISTED BILATERAL SALPINGO OOPHORECTOMY;  Surgeon: Everitt Amber, MD;  Location: [facility];  Service: Gynecology;  Laterality: Bilateral;  . TRANSTHORACIC ECHOCARDIOGRAM  01/27/2020   EF 65-70%, NORMAL.  . TUBAL LIGATION      There were no vitals filed for this visit.    Subjective Assessment - 09/02/20 0911    Subjective Patient presents to therapy with complaint of weakness s/p CVA on 01/26/20. Patient participated in inpatient therapy and had been doing home health therapy since November. Patient says she wants to be walking better. She had been walking around her house with home health therapy using RW. She notes that her RT foot wants to turn in.    Patient is accompained by: Family member    Pertinent History LT CVA 01/26/20    Limitations Standing;Walking;House hold activities    Patient Stated Goals Walk more/ better    Currently in Pain? Yes    Pain Score 5     Pain Location Hip    Pain Orientation Right    Pain Descriptors / Indicators Aching    Pain Type Chronic pain    Pain Onset More than a month ago    Pain Frequency Intermittent    Aggravating Factors  Movement, standing, walking    Pain Relieving Factors Rest    Effect of Pain on Daily Activities Limits              [facility] PT Assessment - 09/02/20 0001      Assessment   Medical Diagnosis Weakness, gait s/p CVA    Referring Provider (PT) Alger Simons MD    Onset Date/Surgical Date 01/26/20    Prior Therapy Yes      Precautions   Precautions Fall    Required Braces or Orthoses Other Brace/Splint   RT ankle AFO     Restrictions   Weight Bearing Restrictions No      Balance Screen   Has the patient fallen in the past 6 months Yes    How many times? 1     Has the patient had a decrease in activity level because of a fear of falling?  Yes    Is the patient reluctant to leave their home because of a fear of falling?  No      Home Environment   Living Environment Private residence    Living Arrangements Spouse/significant other    Available Help at Discharge Family      Prior Function  Level of Independence Independent      Cognition   Overall Cognitive Status Within Functional Limits for tasks assessed      Sensation   Light Touch Impaired by gross assessment   Decreased sensation about RLE     ROM / Strength   AROM / PROM / Strength Strength      Strength   Strength Assessment Site Hip;Knee;Ankle    Right/Left Hip Right;Left    Right Hip Flexion 4-/5    Left Hip Flexion 4+/5    Right/Left Knee Right;Left    Right Knee Extension 4/5    Left Knee Extension 4+/5    Right/Left Ankle Right;Left    Right Ankle Dorsiflexion 3+/5    Left Ankle Dorsiflexion 4+/5      Transfers   Transfers Sit to Stand    Sit to Stand 6: Modified independent (Device/Increase time);5: Supervision      Ambulation/Gait   Ambulation/Gait Yes    Ambulation/Gait Assistance 4: Min guard    Ambulation Distance (Feet) 60 Feet    Assistive device Rolling walker    Gait Pattern Decreased step length - left;Decreased stance time - right;Decreased stride length;Decreased dorsiflexion - right;Step-to pattern    Ambulation Surface Level;Indoor    Gait Comments 2MWT      Balance   Balance Assessed Yes      Static Standing Balance   Static Standing - Comment/# of Minutes staggered stance RLE 10 seconds mod sway, LLE 15 sec min sway                      Objective measurements completed on examination: See above findings.       [facility] Adult PT Treatment/Exercise - 09/02/20 0001      Exercises   Exercises Knee/Hip      Knee/Hip Exercises: Seated   Long Arc Quad 10 reps    Other Seated Knee/Hip Exercises seated heal/toe raise x10     Marching 10 reps                  PT Education - 09/02/20 0913    Education Details on evaluation findings, POC and HEP    Person(s) Educated Patient;Child(ren)   Son   Methods Explanation;Handout    Comprehension Verbalized understanding            PT Short Term Goals - 09/02/20 1609      PT SHORT TERM GOAL #1   Title Patient will be independent with initial HEP and self-management strategies to improve functional outcomes    Time 4    Period Weeks    Status New    Target Date 09/30/20      PT SHORT TERM GOAL #2   Title Patient will be able to ambulate at least 100 feet during 2MWT with LRAD to demonstrate improved ability to perform functional mobility and associated tasks.    Time 4    Period Weeks    Status New    Target Date 09/30/20             PT Long Term Goals - 09/02/20 1610      PT LONG TERM GOAL #1   Title Patient will be able to ambulate at least 225 feet during 2MWT with LRAD to demonstrate improved ability to perform functional mobility and associated tasks.    Time 8    Period Weeks    Status New    Target Date 10/28/20  PT LONG TERM GOAL #2   Title Patient will have equal to or > 4/5 MMT throughout RLE to improve ability to perform functional mobility, stair ambulation and ADLs.    Time 8    Period Weeks    Status New    Target Date 10/28/20      PT LONG TERM GOAL #3   Title Patient will be able to maintain semi tandem stance >30 seconds on BLEs to improve stability and reduce risk for falls    Time 8    Period Weeks    Status New    Target Date 10/28/20                  Plan - 09/02/20 1605    Clinical Impression Statement Patient is a 75 y.o. female who presents to physical therapy with complaint of weakness and gait difficulty s/p CVA on 01/26/2020. Patient demonstrates decreased strength, balance deficits and gait abnormalities which are negatively impacting patient ability to perform ADLs and functional mobility  tasks. Patient will benefit from skilled physical therapy services to address these deficits to improve level of function with ADLs, functional mobility tasks, and reduce risk for falls.    Personal Factors and Comorbidities Age;Comorbidity 1    Comorbidities CVA    Examination-Activity Limitations Locomotion Level;Bed Mobility;Stand;Stairs;Transfers    Examination-Participation Restrictions Community Activity;Yard Work;Laundry;Driving;Cleaning    Stability/Clinical Decision Making Stable/Uncomplicated    Clinical Decision Making Low    Rehab Potential Good    PT Frequency 2x / week    PT Duration 8 weeks    PT Treatment/Interventions ADLs/Self Care Home Management;Aquatic Therapy;Iontophoresis 4mg /ml Dexamethasone;DME Instruction;Balance training;Passive range of motion;Scar mobilization;Neuromuscular re-education;Gait training;Stair training;Functional mobility training;Patient/family education;Manual techniques;Dry needling;Energy conservation;Manual lymph drainage;Parrafin;Ultrasound;Therapeutic activities;Canalith Repostioning;Moist Heat;Traction;Biofeedback;Cryotherapy;Fluidtherapy;Electrical Stimulation;Contrast Bath;Therapeutic exercise;Orthotic Fit/Training;Compression bandaging;Splinting;Taping;Vasopneumatic Device;Joint Manipulations;Spinal Manipulations;Visual/perceptual remediation/compensation;Vestibular    PT Next Visit Plan Review goals and HEP. Progress standing activity. Add heel raise, step ups, standing hip abduction, sit to stands, static balance. Progress dynamic balance, and giat as tolerated    PT Home Exercise Plan Eval: seated march, heel/toe raise, LAQs    Consulted and Agree with Plan of Care Patient;Family member/caregiver    Family Member Consulted Son           Patient will benefit from skilled therapeutic intervention in order to improve the following deficits and impairments:  Pain,Improper body mechanics,Abnormal gait,Decreased balance,Decreased activity  tolerance,Decreased range of motion,Decreased strength,Hypomobility,Decreased mobility,Impaired sensation,Difficulty walking  Visit Diagnosis: Other abnormalities of gait and mobility  Muscle weakness (generalized)     Problem List Patient Active Problem List   Diagnosis Date Noted  . Pain in joint of right foot 04/15/2020  . Right spastic hemiparesis ([place]) 03/04/2020  . Chronic otitis externa of right ear 02/20/2020  . Intraparenchymal hemorrhage of brain ([place])   . Delirium   . 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 Stroke (cerebrum) ([place]) 01/26/2020  . Conductive hearing loss of right ear with unrestricted hearing of left ear 05/28/2019  . Hyponatremia 08/31/2017  . Generalized weakness 08/31/2017  . Dehydration 08/31/2017  . UTI (urinary tract infection) 08/31/2017  . Tympanic membrane rupture, right 12/01/2016  . Right ovarian cyst 08/22/2016  . Fibromyalgia 06/03/2015  . Cystocele 03/18/2014  . 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 Diverticulosis of colon without hemorrhage 06/12/2013  . Hypertension   . Osteopenia   . Chronic low back pain 05/12/2011  . HYPERCHOLESTEROLEMIA 03/17/2008  . Anxiety state 09/17/2007  . GERD 09/17/2007  .  DJD (degenerative joint disease) 09/17/2007   4:14 PM, 09/02/20 Josue Hector PT DPT  Physical Therapist with [facility]  [phone] [place], [place], [address] Phone: [phone]   Fax:  [phone]  Name: Alison Cuevas MRN: [id number] Date of Birth: 1945/07/29

## 2020-09-03 ENCOUNTER — Telehealth: Payer: Self-pay | Admitting: Family Medicine

## 2020-09-03 NOTE — Telephone Encounter (Signed)
   Aahana Elza DOB: [birth year] MRN: [id number]   RIDER WAIVER AND RELEASE OF LIABILITY  For purposes of improving physical access to our facilities, [facility] is pleased to partner with third parties to provide [facility] patients or other authorized individuals the option of convenient, on-demand ground transportation services (the [place]") through use of the technology service that enables users to request on-demand ground transportation from independent third-party providers.  By opting to use and accept these [employer], I, the undersigned, hereby agree on behalf of myself, and on behalf of any minor child using the [employer] for whom I am the parent or legal guardian, as follows:  1. Government social research officer provided to me are provided by independent third-party transportation providers who are not [employer] or employees and who are unaffiliated with [employer]. 2. [facility] is neither a transportation carrier nor a common or public carrier. 3. [facility] has no control over the quality or safety of the transportation that occurs as a result of the [employer]. 4. [facility] cannot guarantee that any third-party transportation provider will complete any arranged transportation service. 5. [facility] makes no representation, warranty, or guarantee regarding the reliability, timeliness, quality, safety, suitability, or availability of any of the Transport Services or that they will be error free. 6. I fully understand that traveling by vehicle involves risks and dangers of serious bodily injury, including permanent disability, paralysis, and death. I agree, on behalf of myself and on behalf of any minor child using the Transport Services for whom I am the parent or legal guardian, that the entire risk arising out of my use of the [employer] remains solely with me, to the maximum extent permitted under applicable law. 7. The [employer]  are provided "as is" and "as available." [facility] disclaims all representations and warranties, express, implied or statutory, not expressly set out in these terms, including the implied warranties of merchantability and fitness for a particular purpose. 8. I hereby waive and release [facility], its agents, employees, officers, directors, representatives, insurers, attorneys, assigns, successors, subsidiaries, and affiliates from any and all past, present, or future claims, demands, liabilities, actions, causes of action, or suits of any kind directly or indirectly arising from acceptance and use of the [employer]. 9. I further waive and release [facility] and its affiliates from all present and future liability and responsibility for any injury or death to persons or damages to property caused by or related to the use of the [employer]. 10. I have read this Waiver and Release of Liability, and I understand the terms used in it and their legal significance. This Waiver is freely and voluntarily given with the understanding that my right (as well as the right of any minor child for whom I am the parent or legal guardian using the [employer]) to legal recourse against [place] [facility] in connection with the [employer] is knowingly surrendered in return for use of these services.   I attest that I read the consent document to [name], gave Ms. Caldwell the opportunity to ask questions and answered the questions asked (if any). I affirm that [name] then provided consent for she's participation in this program.     Legrand Pitts, Son consented on mother behalf

## 2020-09-08 ENCOUNTER — Telehealth: Payer: Self-pay | Admitting: Family Medicine

## 2020-09-08 NOTE — Telephone Encounter (Signed)
Patient requesting new RX for hydrocodone. I explained her most recent RX for it came from a different doctor. She states Dr. Anitra Lauth prescribed it in the past for general muscle pain and insisted I ask him for more.  Also, patient would like to discontinue meloxicam because another doctor told her it could cause stroke and she has already had one. She would like different medication called in to replace the meloxicam.  [facility] appropriate [facility] [place].

## 2020-09-08 NOTE — Telephone Encounter (Signed)
Spoke with patient regarding refill request and med replacement and offer appt. She was last seen 05/25/20 and normally follows up every 3 months for chronic medical conditions. Last Rx completed for hydrocodone by PCP was 08/28/19 #90 but was stopped at discharge on 01/29/20. Advised an appointment would be better since she has not been seen November, pt agreed. Offered to schedule appt but would call back after discussing with son since he is her transportation. Husband is due to come back for follow up labs in 2 weeks, she can schedule an appointment around the time of his appt. Nothing scheduled as of yet.

## 2020-09-09 ENCOUNTER — Ambulatory Visit (HOSPITAL_COMMUNITY): Payer: Medicare Other | Admitting: Physical Therapy

## 2020-09-09 ENCOUNTER — Other Ambulatory Visit: Payer: Self-pay

## 2020-09-09 ENCOUNTER — Encounter (HOSPITAL_COMMUNITY): Payer: Self-pay | Admitting: Physical Therapy

## 2020-09-09 DIAGNOSIS — R29898 Other symptoms and signs involving the musculoskeletal system: Secondary | ICD-10-CM

## 2020-09-09 DIAGNOSIS — M6281 Muscle weakness (generalized): Secondary | ICD-10-CM

## 2020-09-09 DIAGNOSIS — R278 Other lack of coordination: Secondary | ICD-10-CM | POA: Diagnosis not present

## 2020-09-09 DIAGNOSIS — R2689 Other abnormalities of gait and mobility: Secondary | ICD-10-CM

## 2020-09-09 NOTE — Therapy (Signed)
[place], [place], [address] Phone: [phone]   Fax:  [phone]  Physical Therapy Treatment  Patient Details  Name: Alison Cuevas MRN: [id number] Date of Birth: Sep 08, 1945 Referring Provider (PT): Alger Simons MD   Encounter Date: 09/09/2020   PT End of Session - 09/09/20 1544    Visit Number 2    Number of Visits 16    Date for PT Re-Evaluation 10/28/20    Authorization Type Medicare A/ [place] NAP 2ndary    Progress Note Due on Visit 10    PT Start Time 1453    PT Stop Time 1532    PT Time Calculation (min) 39 min    Activity Tolerance Patient tolerated treatment well    Behavior During Therapy [facility] for tasks assessed/performed           Past Medical History:  Diagnosis Date  . Allergic rhinitis    maple pollen  . Anxiety   . Arthritis   . Bowel obstruction ([place])   . Chronic low back pain    Dr. Trenton Gammon.  Has had back injection--bp elevated after.  . Chronic pain syndrome   . Chronic renal insufficiency, stage 2 (mild)    GFR 60-70  . DDD (degenerative disc disease), cervical    Hx of ACDF (Dr. [name]).  Followed by Dr. [name].  Also, Dr. [name] do left C7-T1 intralaminal epidural injection.  . Diverticulosis of colon   . DJD (degenerative joint disease)   . Gallstones   . GERD (gastroesophageal reflux disease)   . Herpes zoster 07/08/2014  . 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  Resid R arm and leg weakness + flexion contractures.  . Hypercholesteremia    mild; pt declined statin trial 05/2014--needs recheck lipid panel at first f/u visit in 2017  . Hypertension   . Osteopenia 06/2017   T score -1.4 FRAX 15% / 2%  . Ovarian cyst 2017   82019-robotic assisted bilatel SPO: all path benign.  . Perianal dermatitis    prn cutivate  . Phlebitis   . Right hemiplegia ([place]) 01/2020   R arm and leg  (hemorrhagic L cerebral hemisphere CVA)  . Right knee injury 2018   Patellofemoral crush injury--Dr. [name].  . Trochanteric bursitis of right hip    Recurrent (injection by Dr. [name] 05/26/15)  . Tympanic membrane rupture, right 12/01/2016   As of 08/2018 pt set for tympanomastoidectomy and STSG (Dr. [name]). Recurrent fungal/bact OE.  . Varicose vein    left leg     Past Surgical History:  Procedure Laterality Date  . ABDOMINAL HYSTERECTOMY  03/2015   TAH.  Last pap 2016.  No hx of abnl paps.  Per GYN, no further pap smears indicated.  . ANTERIOR AND POSTERIOR REPAIR N/A 03/18/2014   Procedure: Cystocele repair with graft, Vault suspension, Rectocele repair;  Surgeon: [name], MD;  [facility]: [facility];  Service: Urology;  Laterality: N/A;  . CHOLECYSTECTOMY    . CHOLECYSTECTOMY, LAPAROSCOPIC    . COLON SURGERY    . COLONOSCOPY  04/2006; 05/26/16   2007 (Dr. [name]): Normal.  04/2016 (Dr. [name]) normal except diverticulosis and decreased anal sphincter tone.  No repeat colonoscopy is recommended due to age.  . cspine surgery     Dr. Wiliam Ke level ant cerv discectomy /fusion w/plating  . CYSTO N/A 03/18/2014   Procedure: CYSTO;  Surgeon: Elayne Snare  MacDiarmid, MD;  Location: [facility];  Service: Urology;  Laterality: N/A;  . DEXA  07/30/2015; 06/2018   2017 and 2020 -->Osteopenia--repeat 2 yrs.  Marland Kitchen LYSIS OF ADHESION N/A 01/31/2018   Procedure: POSSIBLE LYSIS OF ADHESION;  Surgeon: Everitt Amber, MD;  [facility];  Service: Gynecology;  Laterality: N/A;  . [place]     with prolasped bledder repair  . RESECTION OF COLON     BENIGN TUMOR  . RIGHT EAR SURGERY  08/23/2017   Dr. 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 right ear surgery  12/12/2018   Right tympanomastoidectomy, ossiculoplasty with partial prosthesis, split thickness  skin graft from postauricular skin 1x1cm, and right tragal cartilage graft 1x1 cm [facility])  . right hemicolectomy for diverticulitis with abscess  1993  . ROBOTIC ASSISTED BILATERAL SALPINGO OOPHERECTOMY Bilateral 01/31/2018   All PATH benign.  Procedure: XI ROBOTIC ASSISTED BILATERAL SALPINGO OOPHORECTOMY;  Surgeon: Everitt Amber, MD;  [facility];  Service: Gynecology;  Laterality: Bilateral;  . TRANSTHORACIC ECHOCARDIOGRAM  01/27/2020   EF 65-70%, NORMAL.  . TUBAL LIGATION      There were no vitals filed for this visit.   Subjective Assessment - 09/09/20 1450    Subjective `PT states that she has been doing her HEP.  States that she tires easily.    Patient is accompained by: Family member    Pertinent History LT CVA 01/26/20    Limitations Standing;Walking;House hold activities    Patient Stated Goals Walk more/ better    Currently in Pain? Yes    Pain Score 8     Pain Location Hip    Pain Orientation Right    Pain Descriptors / Indicators Other (Comment)   pulling   Pain Type Chronic pain    Pain Onset More than a month ago    Pain Frequency Intermittent    Aggravating Factors  wt bearing    Pain Relieving Factors sitting    Effect of Pain on Daily Activities limits                  [facility] Adult PT Treatment/Exercise - 09/09/20 0001      Exercises   Exercises Knee/Hip      Knee/Hip Exercises: Seated   Long Arc Quad Right;10 reps    Long Arc Quad Weight 3 lbs.    Sit to [employer] 10 reps               Balance Exercises - 09/09/20 0001      Balance Exercises: Standing   Standing Eyes Opened Narrow base of support (BOS)   hold 10 seconds x 5.   Tandem Stance Eyes open;5 reps    Sidestepping 1 rep    Marching Upper extremity assist 2;10 reps    Sit to Stand Standard surface   10 x              PT Short Term Goals - 09/09/20 1551      PT SHORT TERM GOAL #1   Title Patient will be independent with initial HEP and self-management strategies to improve  functional outcomes    Time 4    Period Weeks    Status On-going    Target Date 09/30/20      PT SHORT TERM GOAL #2   Title Patient will be able to ambulate at least  100 feet during 2MWT with LRAD to demonstrate improved ability to perform functional mobility and associated tasks.    Time 4    Period Weeks    Status On-going    Target Date 09/30/20             PT Long Term Goals - 09/09/20 1551      PT LONG TERM GOAL #1   Title Patient will be able to ambulate at least 225 feet during 2MWT with LRAD to demonstrate improved ability to perform functional mobility and associated tasks.    Time 8    Period Weeks    Status On-going      PT LONG TERM GOAL #2   Title Patient will have equal to or > 4/5 MMT throughout RLE to improve ability to perform functional mobility, stair ambulation and ADLs.    Time 8    Period Weeks    Status On-going      PT LONG TERM GOAL #3   Title Patient will be able to maintain semi tandem stance >30 seconds on BLEs to improve stability and reduce risk for falls    Time 8    Period Weeks    Status On-going                 Plan - 09/09/20 1546    Clinical Impression Statement PT very agreeable with therapy, in fact, therapist must pay attention as pt will not tell therapist when she needs to rest as she wants to keep working.  Pt needs mod assist for safety with all dynamic activity.  PT standing balance, fair (-).  PT will need skilled therapy to address strength, balance and coordination deficits.    Personal Factors and Comorbidities Age;Comorbidity 1    Comorbidities CVA    Examination-Activity Limitations Locomotion Level;Bed Mobility;Stand;Stairs;Transfers    Examination-Participation Restrictions Community Activity;Yard Work;Laundry;Driving;Cleaning    Stability/Clinical Decision Making Stable/Uncomplicated    Rehab Potential Good    PT Frequency 2x / week    PT Duration 8 weeks    PT Treatment/Interventions ADLs/Self Care Home  Management;Aquatic Therapy;Iontophoresis 4mg /ml Dexamethasone;DME Instruction;Balance training;Passive range of motion;Scar mobilization;Neuromuscular re-education;Gait training;Stair training;Functional mobility training;Patient/family education;Manual techniques;Dry needling;Energy conservation;Manual lymph drainage;Parrafin;Ultrasound;Therapeutic activities;Canalith Repostioning;Moist Heat;Traction;Biofeedback;Cryotherapy;Fluidtherapy;Electrical Stimulation;Contrast Bath;Therapeutic exercise;Orthotic Fit/Training;Compression bandaging;Splinting;Taping;Vasopneumatic Device;Joint Manipulations;Spinal Manipulations;Visual/perceptual remediation/compensation;Vestibular    PT Next Visit Plan . Progress standing activity. Add heel raise, standing hip abduction, . Progress dynamic balance, and giat as tolerated    PT Home Exercise Plan Eval: seated march, heel/toe raise, LAQs    Consulted and Agree with Plan of Care Patient;Family member/caregiver    Family Member Consulted Son           Patient will benefit from skilled therapeutic intervention in order to improve the following deficits and impairments:  Pain,Improper body mechanics,Abnormal gait,Decreased balance,Decreased activity tolerance,Decreased range of motion,Decreased strength,Hypomobility,Decreased mobility,Impaired sensation,Difficulty walking  Visit Diagnosis: Other abnormalities of gait and mobility  Muscle weakness (generalized)  Other lack of coordination  Other symptoms and signs involving the musculoskeletal system     Problem List Patient Active Problem List   Diagnosis Date Noted  . Pain in joint of right foot 04/15/2020  . Right spastic hemiparesis ([place]) 03/04/2020  . Chronic otitis externa of right ear 02/20/2020  . Intraparenchymal hemorrhage of brain ([place])   . Delirium   . 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 Stroke (cerebrum) ([place]) 01/26/2020  . Conductive hearing  loss of right ear with unrestricted hearing of  left ear 05/28/2019  . Hyponatremia 08/31/2017  . Generalized weakness 08/31/2017  . Dehydration 08/31/2017  . UTI (urinary tract infection) 08/31/2017  . Tympanic membrane rupture, right 12/01/2016  . Right ovarian cyst 08/22/2016  . Fibromyalgia 06/03/2015  . Cystocele 03/18/2014  . 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 Diverticulosis of colon without hemorrhage 06/12/2013  . Hypertension   . Osteopenia   . Chronic low back pain 05/12/2011  . HYPERCHOLESTEROLEMIA 03/17/2008  . Anxiety state 09/17/2007  . GERD 09/17/2007  . DJD (degenerative joint disease) 09/17/2007    Rayetta Humphrey, PT CLT [phone] 09/09/2020, 3:52 PM  [facility] [address] [place], [place], [address] Phone: [phone]   Fax:  [phone]  Name: Jung Ingerson MRN: [id number] Date of Birth: 05/01/1946

## 2020-09-10 ENCOUNTER — Encounter (HOSPITAL_COMMUNITY): Payer: Medicare Other | Admitting: Specialist

## 2020-09-11 ENCOUNTER — Ambulatory Visit (HOSPITAL_COMMUNITY): Payer: Medicare Other | Admitting: Occupational Therapy

## 2020-09-11 ENCOUNTER — Other Ambulatory Visit: Payer: Self-pay

## 2020-09-11 ENCOUNTER — Encounter (HOSPITAL_COMMUNITY): Payer: Self-pay | Admitting: Occupational Therapy

## 2020-09-11 ENCOUNTER — Ambulatory Visit (HOSPITAL_COMMUNITY): Payer: Medicare Other

## 2020-09-11 ENCOUNTER — Encounter (HOSPITAL_COMMUNITY): Payer: Self-pay

## 2020-09-11 ENCOUNTER — Telehealth (HOSPITAL_COMMUNITY): Payer: Self-pay | Admitting: Occupational Therapy

## 2020-09-11 DIAGNOSIS — R278 Other lack of coordination: Secondary | ICD-10-CM

## 2020-09-11 DIAGNOSIS — R2689 Other abnormalities of gait and mobility: Secondary | ICD-10-CM

## 2020-09-11 DIAGNOSIS — M6281 Muscle weakness (generalized): Secondary | ICD-10-CM

## 2020-09-11 DIAGNOSIS — R29898 Other symptoms and signs involving the musculoskeletal system: Secondary | ICD-10-CM

## 2020-09-11 NOTE — Therapy (Signed)
[facility] [place], [place], [address] Phone: [phone]   Fax:  [phone]  Occupational Therapy Treatment  Patient Details  Name: Alison Cuevas MRN: [id number] Date of Birth: 02/27/1946 Referring Provider (OT): Alger Simons, MD   Encounter Date: 09/11/2020   OT End of Session - 09/11/20 1004    Visit Number 2    Number of Visits 12    Date for OT Re-Evaluation 10/14/20    Authorization Type double coverage Medicare 80/20% secondary: [employer] Covers what medicare doesn't.    Progress Note Due on Visit 10    OT Start Time [id number]    OT Stop Time 1024    OT Time Calculation (min) 46 min    Activity Tolerance Patient tolerated treatment well    Behavior During Therapy [facility] for tasks assessed/performed           Past Medical History:  Diagnosis Date  . Allergic rhinitis    maple pollen  . Anxiety   . Arthritis   . Bowel obstruction ([place])   . Chronic low back pain    Dr. Trenton Gammon.  Has had back injection--bp elevated after.  . Chronic pain syndrome   . Chronic renal insufficiency, stage 2 (mild)    GFR 60-70  . DDD (degenerative disc disease), cervical    Hx of ACDF (Dr. [name]).  Followed by Dr. [name].  Also, Dr. [name] do left C7-T1 intralaminal epidural injection.  . Diverticulosis of colon   . DJD (degenerative joint disease)   . Gallstones   . GERD (gastroesophageal reflux disease)   . Herpes zoster 07/08/2014  . 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  Resid R arm and leg weakness + flexion contractures.  . Hypercholesteremia    mild; pt declined statin trial 05/2014--needs recheck lipid panel at first f/u visit in 2017  . Hypertension   . Osteopenia 06/2017   T score -1.4 FRAX 15% / 2%  . Ovarian cyst 2017   82019-robotic assisted bilatel SPO: all path benign.  . Perianal dermatitis    prn cutivate  .  Phlebitis   . Right hemiplegia ([place]) 01/2020   R arm and leg (hemorrhagic L cerebral hemisphere CVA)  . Right knee injury 2018   Patellofemoral crush injury--Dr. [name].  . Trochanteric bursitis of right hip    Recurrent (injection by Dr. [name] 05/26/15)  . Tympanic membrane rupture, right 12/01/2016   As of 08/2018 pt set for tympanomastoidectomy and STSG (Dr. [name]). Recurrent fungal/bact OE.  . Varicose vein    left leg     Past Surgical History:  Procedure Laterality Date  . ABDOMINAL HYSTERECTOMY  03/2015   TAH.  Last pap 2016.  No hx of abnl paps.  Per GYN, no further pap smears indicated.  . ANTERIOR AND POSTERIOR REPAIR N/A 03/18/2014   Procedure: Cystocele repair with graft, Vault suspension, Rectocele repair;  Surgeon: [name], MD;  [facility];  Service: Urology;  Laterality: N/A;  . CHOLECYSTECTOMY    . CHOLECYSTECTOMY, LAPAROSCOPIC    . COLON SURGERY    . COLONOSCOPY  04/2006; 05/26/16   2007 (Dr. [name]): Normal.  04/2016 (Dr. [name]) normal except diverticulosis and decreased anal sphincter tone.  No repeat colonoscopy is recommended due to age.  . cspine surgery     Dr. Wiliam Ke level ant cerv discectomy /fusion w/plating  . CYSTO N/A 03/18/2014   Procedure:  CYSTO;  Surgeon: [name], MD;  [facility];  Service: Urology;  Laterality: N/A;  . DEXA  07/30/2015; 06/2018   2017 and 2020 -->Osteopenia--repeat 2 yrs.  Marland Kitchen LYSIS OF ADHESION N/A 01/31/2018   Procedure: POSSIBLE LYSIS OF ADHESION;  Surgeon: Everitt Amber, MD;  [facility];  Service: Gynecology;  Laterality: N/A;  . [place]     with prolasped bledder repair  . RESECTION OF COLON     BENIGN TUMOR  . RIGHT EAR SURGERY  08/23/2017   Dr. 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 right ear surgery  12/12/2018   Right  tympanomastoidectomy, ossiculoplasty with partial prosthesis, split thickness skin graft from postauricular skin 1x1cm, and right tragal cartilage graft 1x1 cm [facility])  . right hemicolectomy for diverticulitis with abscess  1993  . ROBOTIC ASSISTED BILATERAL SALPINGO OOPHERECTOMY Bilateral 01/31/2018   All PATH benign.  Procedure: XI ROBOTIC ASSISTED BILATERAL SALPINGO OOPHORECTOMY;  Surgeon: Everitt Amber, MD;  [facility];  Service: Gynecology;  Laterality: Bilateral;  . TRANSTHORACIC ECHOCARDIOGRAM  01/27/2020   EF 65-70%, NORMAL.  . TUBAL LIGATION      There were no vitals filed for this visit.   Subjective Assessment - 09/11/20 0938    Subjective  S: My arm still feels tight.    Currently in Pain? Yes    Pain Score 5     Pain Location Foot    Pain Orientation Right    Pain Descriptors / Indicators Aching    Pain Type Chronic pain    Pain Radiating Towards N/A    Pain Onset More than a month ago    Pain Frequency Intermittent    Aggravating Factors  weightbearing    Pain Relieving Factors sitting    Effect of Pain on Daily Activities mod effects    Multiple Pain Sites No              [facility] OT Assessment - 09/11/20 0938      Assessment   Medical Diagnosis Right side weakness S/P CVA      Precautions   Precautions Fall      Observation/Other Assessments   Focus on Therapeutic Outcomes (FOTO)  35/100                    OT Treatments/Exercises (OP) - 09/11/20 0943      Exercises   Exercises Hand      Hand Exercises   Other Hand Exercises Pt using 3 point pinch to place clothespins along pinch tree.      Neurological Re-education Exercises   Shoulder Flexion AROM;10 reps    Shoulder ABduction AROM;10 reps    Shoulder Protraction AROM;10 reps    Shoulder Horizontal ABduction AROM;10 reps    Shoulder External Rotation AROM;10 reps    Shoulder Internal Rotation AROM;10 reps    Elbow Flexion Strengthening;10 reps   2# bicep curls, 1# hammer curl and  pronated position   Elbow Extension Strengthening;10 reps   2# bicep curls, 1# hammer curl and pronated position   Other Exercises 1 proximal shoulder strengthening in sitting: 10X each, no rest breaks; ABC writing-holding arm at 90 degrees flexion with hand fisted.    Other Exercises 2 dysdiadokinesia work-rapidly alternating supination/pronation on thighs, 30", 2x      Functional Reaching Activities   Mid Level Pt completing  pinch tree while seated, working on functional reach of RUE as well as gross motor coordination for placing clothespin on the bar.                    OT Short Term Goals - 09/11/20 0941      OT SHORT TERM GOAL #1   Title Pt will be educated and independent with HEP in order to faciliate progress in therapy and work towards increasing her RUE strength and coordination while increasing her functional performance during ADL tasks.    Time 3    Period Weeks    Status On-going    Target Date 09/23/20             OT Long Term Goals - 09/11/20 0941      OT LONG TERM GOAL #1   Title Patient will increase right shoulder strength and elbow to 5/5 while demonstrating increased shoulder and scapular stability when reaching away from her body/base of support.    Time 6    Period Weeks    Status On-going      OT LONG TERM GOAL #2   Title Patient will increase right hand coordination by completing the 9 hole peg test in 30 seconds or less in order to roll hair with less difficulty.    Time 6    Period Weeks    Status On-going      OT LONG TERM GOAL #3   Title Patient will increase right hand grip strength by 10# and pinch strength by 5# in order to increase ability to grasp and manipulate items with less difficulty.    Time 6    Period Weeks    Status On-going      OT LONG TERM GOAL #4   Title patient will reach highest level of independence with increased confidence and safety awareness while completing all ADL tasks at modified independent level or  better while utilizing adaptive equipment and/or compensatory strategies if needed.    Baseline 3/9: Pt is sponge bathing and does not take a shower unless someone is present for supervision.    Time 6    Period Weeks    Status On-going                 Plan - 09/11/20 1007    Clinical Impression Statement A: Reviewed goals and HEP with pt. Initiated shoulder A/ROM and continued with proximal shoulder strengthening. Pt completing functional reaching task working on reach and gross motor coordination, as well as incorporating pinch strengthening. Pt with moderate difficulty with rapid alternating movements during coordination work. Verbal cuing for form and technique, as well as for taking rest breaks as needed during tasks.    Body Structure / Function / Physical Skills ADL;UE functional use;FMC;ROM;Proprioception;Coordination;GMC;Strength    Plan P: continue with RUE shoulder and scapular strengthening and stability work, add grip work-hand gripper or theraputty activity    OT Home Exercise Plan Eval: proximal shoulder strengthening; 3/18: shoulder A/ROM    Consulted and Agree with Plan of Care Patient           Patient will benefit from skilled therapeutic intervention in order to improve the following deficits and impairments:   Body Structure / Function / Physical Skills: ADL,UE functional use,FMC,ROM,Proprioception,Coordination,GMC,Strength       Visit Diagnosis: Other lack of coordination  Other symptoms and signs involving the musculoskeletal system    Problem List Patient Active Problem List   Diagnosis Date Noted  .  Pain in joint of right foot 04/15/2020  . Right spastic hemiparesis ([place]) 03/04/2020  . Chronic otitis externa of right ear 02/20/2020  . Intraparenchymal hemorrhage of brain ([place])   . Delirium   . 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 Stroke (cerebrum) ([place]) 01/26/2020  . Conductive hearing loss of  right ear with unrestricted hearing of left ear 05/28/2019  . Hyponatremia 08/31/2017  . Generalized weakness 08/31/2017  . Dehydration 08/31/2017  . UTI (urinary tract infection) 08/31/2017  . Tympanic membrane rupture, right 12/01/2016  . Right ovarian cyst 08/22/2016  . Fibromyalgia 06/03/2015  . Cystocele 03/18/2014  . 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 Diverticulosis of colon without hemorrhage 06/12/2013  . Hypertension   . Osteopenia   . Chronic low back pain 05/12/2011  . HYPERCHOLESTEROLEMIA 03/17/2008  . Anxiety state 09/17/2007  . GERD 09/17/2007  . DJD (degenerative joint disease) 09/17/2007   Guadelupe Sabin, OTR/L  [phone] 09/11/2020, 11:58 AM  [facility] [place], [place], [address] Phone: [phone]   Fax:  [phone]  Name: Alison Cuevas MRN: [id number] Date of Birth: 14-May-1946

## 2020-09-11 NOTE — Telephone Encounter (Signed)
S/w Christain from [employer] has submitted request and they will let patient's son know if they can not pick her up. NH 4:01pm

## 2020-09-11 NOTE — Patient Instructions (Signed)

## 2020-09-11 NOTE — Therapy (Signed)
[place], [place], [address] Phone: [phone]   Fax:  [phone]  Physical Therapy Treatment  Patient Details  Name: Alison Cuevas MRN: [id number] Date of Birth: 1945-11-12 Referring Provider (PT): Alger Simons MD   Encounter Date: 09/11/2020   PT End of Session - 09/11/20 0922    Visit Number 3    Number of Visits 16    Date for PT Re-Evaluation 10/28/20    Authorization Type Medicare A/ [place] [employer] 2ndary    Progress Note Due on Visit 10    PT Start Time 0832    PT Stop Time 0915    PT Time Calculation (min) 43 min    Activity Tolerance Patient tolerated treatment well    Behavior During Therapy [facility] for tasks assessed/performed           Past Medical History:  Diagnosis Date  . Allergic rhinitis    maple pollen  . Anxiety   . Arthritis   . Bowel obstruction ([place])   . Chronic low back pain    Dr. Trenton Gammon.  Has had back injection--bp elevated after.  . Chronic pain syndrome   . Chronic renal insufficiency, stage 2 (mild)    GFR 60-70  . DDD (degenerative disc disease), cervical    Hx of ACDF (Dr. [name]).  Followed by Dr. [name].  Also, Dr. [name] do left C7-T1 intralaminal epidural injection.  . Diverticulosis of colon   . DJD (degenerative joint disease)   . Gallstones   . GERD (gastroesophageal reflux disease)   . Herpes zoster 07/08/2014  . 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  Resid R arm and leg weakness + flexion contractures.  . Hypercholesteremia    mild; pt declined statin trial 05/2014--needs recheck lipid panel at first f/u visit in 2017  . Hypertension   . Osteopenia 06/2017   T score -1.4 FRAX 15% / 2%  . Ovarian cyst 2017   82019-robotic assisted bilatel SPO: all path benign.  . Perianal dermatitis    prn cutivate  . Phlebitis   . Right hemiplegia ([place]) 01/2020   R arm and leg  (hemorrhagic L cerebral hemisphere CVA)  . Right knee injury 2018   Patellofemoral crush injury--Dr. [name].  . Trochanteric bursitis of right hip    Recurrent (injection by Dr. [name] 05/26/15)  . Tympanic membrane rupture, right 12/01/2016   As of 08/2018 pt set for tympanomastoidectomy and STSG (Dr. [name]). Recurrent fungal/bact OE.  . Varicose vein    left leg     Past Surgical History:  Procedure Laterality Date  . ABDOMINAL HYSTERECTOMY  03/2015   TAH.  Last pap 2016.  No hx of abnl paps.  Per GYN, no further pap smears indicated.  . ANTERIOR AND POSTERIOR REPAIR N/A 03/18/2014   Procedure: Cystocele repair with graft, Vault suspension, Rectocele repair;  Surgeon: [name], MD;  Location: [facility];  Service: Urology;  Laterality: N/A;  . CHOLECYSTECTOMY    . CHOLECYSTECTOMY, LAPAROSCOPIC    . COLON SURGERY    . COLONOSCOPY  04/2006; 05/26/16   2007 (Dr. [name]): Normal.  04/2016 (Dr. [name]) normal except diverticulosis and decreased anal sphincter tone.  No repeat colonoscopy is recommended due to age.  . cspine surgery     Dr. Wiliam Ke level ant cerv discectomy /fusion w/plating  . CYSTO N/A 03/18/2014   Procedure: CYSTO;  Surgeon: Elayne Snare  MacDiarmid, MD;  Location: [facility];  Service: Urology;  Laterality: N/A;  . DEXA  07/30/2015; 06/2018   2017 and 2020 -->Osteopenia--repeat 2 yrs.  Marland Kitchen LYSIS OF ADHESION N/A 01/31/2018   Procedure: POSSIBLE LYSIS OF ADHESION;  Surgeon: Everitt Amber, MD;  Location: [facility];  Service: Gynecology;  Laterality: N/A;  . [place]     with prolasped bledder repair  . RESECTION OF COLON     BENIGN TUMOR  . RIGHT EAR SURGERY  08/23/2017   Dr. 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 right ear surgery  12/12/2018   Right tympanomastoidectomy, ossiculoplasty with partial prosthesis, split thickness  skin graft from postauricular skin 1x1cm, and right tragal cartilage graft 1x1 cm [facility])  . right hemicolectomy for diverticulitis with abscess  1993  . ROBOTIC ASSISTED BILATERAL SALPINGO OOPHERECTOMY Bilateral 01/31/2018   All PATH benign.  Procedure: XI ROBOTIC ASSISTED BILATERAL SALPINGO OOPHORECTOMY;  Surgeon: Everitt Amber, MD;  [facility];  Service: Gynecology;  Laterality: Bilateral;  . TRANSTHORACIC ECHOCARDIOGRAM  01/27/2020   EF 65-70%, NORMAL.  . TUBAL LIGATION      There were no vitals filed for this visit.   Subjective Assessment - 09/11/20 0830    Subjective Pt stated she wore a different braces (AFO) today to compare the 2.  Reports some pain Rt hip while standing, increased to 3/10    Pertinent History LT CVA 01/26/20    Patient Stated Goals Walk more/ better    Currently in Pain? Yes    Pain Score 3     Pain Location Hip    Pain Orientation Right    Pain Descriptors / Indicators Sore;Aching   when standing   Pain Type Chronic pain    Pain Onset More than a month ago    Pain Frequency Intermittent    Aggravating Factors  weight bearing    Pain Relieving Factors sitting    Effect of Pain on Daily Activities limits                             [facility] Adult PT Treatment/Exercise - 09/11/20 0001      Ambulation/Gait   Ambulation/Gait Yes    Ambulation/Gait Assistance 4: Min guard    Ambulation Distance (Feet) 30 Feet    Assistive device Rolling walker    Gait Pattern Decreased step length - left;Decreased stance time - right;Decreased stride length;Decreased dorsiflexion - right;Step-to pattern    Ambulation Surface Level;Indoor      Knee/Hip Exercises: Standing   Heel Raises Both;2 sets;5 reps    Hip Abduction Both;2 sets;5 reps;Knee straight   heavy UE support   Rocker Board 1 minute    Rocker Board Limitations lateral               Balance Exercises - 09/11/20 0001      Balance Exercises: Standing   Tandem Stance Eyes  open;Intermittent upper extremity support;3 reps;30 secs    Rockerboard Lateral;UE support;10 reps   2 sets   Sidestepping 3 reps   3RT   Marching Upper extremity assist 2;10 reps   toe tapping alternating 6in step   Sit to Stand Standard surface               PT Short Term Goals - 09/09/20 1551  PT SHORT TERM GOAL #1   Title Patient will be independent with initial HEP and self-management strategies to improve functional outcomes    Time 4    Period Weeks    Status On-going    Target Date 09/30/20      PT SHORT TERM GOAL #2   Title Patient will be able to ambulate at least 100 feet during 2MWT with LRAD to demonstrate improved ability to perform functional mobility and associated tasks.    Time 4    Period Weeks    Status On-going    Target Date 09/30/20             PT Long Term Goals - 09/09/20 1551      PT LONG TERM GOAL #1   Title Patient will be able to ambulate at least 225 feet during 2MWT with LRAD to demonstrate improved ability to perform functional mobility and associated tasks.    Time 8    Period Weeks    Status On-going      PT LONG TERM GOAL #2   Title Patient will have equal to or > 4/5 MMT throughout RLE to improve ability to perform functional mobility, stair ambulation and ADLs.    Time 8    Period Weeks    Status On-going      PT LONG TERM GOAL #3   Title Patient will be able to maintain semi tandem stance >30 seconds on BLEs to improve stability and reduce risk for falls    Time 8    Period Weeks    Status On-going                 Plan - 09/11/20 1239    Clinical Impression Statement Pt very motivated to make improvements.  Fatigues quickly and required cueing through therapist with noted visual fatigue and increased breathing, adviced to take rest breaks periodically.  Added rocker board to improve weight distrubition Rt LE as well as heel raises and standing hip abduction.  Pt reports some pain Rt hip with weight bearing, no  reports of increased pain through session.  Pt brought AFO as well as AFO with ankle hinge asking which would be better, pt adviced to continue wiht AFO until she improves dorsiflexion strengthening to reduce foot drop for safety then progress to ankle hinge brace, verbalized understanding.    Personal Factors and Comorbidities Age;Comorbidity 1    Comorbidities CVA    Examination-Activity Limitations Locomotion Level;Bed Mobility;Stand;Stairs;Transfers    Examination-Participation Restrictions Community Activity;Yard Work;Laundry;Driving;Cleaning    Stability/Clinical Decision Making Stable/Uncomplicated    Rehab Potential Good    PT Frequency 2x / week    PT Duration 8 weeks    PT Treatment/Interventions ADLs/Self Care Home Management;Aquatic Therapy;Iontophoresis 4mg /ml Dexamethasone;DME Instruction;Balance training;Passive range of motion;Scar mobilization;Neuromuscular re-education;Gait training;Stair training;Functional mobility training;Patient/family education;Manual techniques;Dry needling;Energy conservation;Manual lymph drainage;Parrafin;Ultrasound;Therapeutic activities;Canalith Repostioning;Moist Heat;Traction;Biofeedback;Cryotherapy;Fluidtherapy;Electrical Stimulation;Contrast Bath;Therapeutic exercise;Orthotic Fit/Training;Compression bandaging;Splinting;Taping;Vasopneumatic Device;Joint Manipulations;Spinal Manipulations;Visual/perceptual remediation/compensation;Vestibular    PT Next Visit Plan Progress HEP next session, give printout.  Progress standing activity.   Progress dynamic balance, and giat as tolerated    PT Home Exercise Plan Eval: seated march, heel/toe raise, LAQs    Consulted and Agree with Plan of Care Patient           Patient will benefit from skilled therapeutic intervention in order to improve the following deficits and impairments:  Pain,Improper body mechanics,Abnormal gait,Decreased balance,Decreased activity tolerance,Decreased range of motion,Decreased  strength,Hypomobility,Decreased mobility,Impaired sensation,Difficulty walking  Visit Diagnosis: Other abnormalities  of gait and mobility  Muscle weakness (generalized)     Problem List Patient Active Problem List   Diagnosis Date Noted  . Pain in joint of right foot 04/15/2020  . Right spastic hemiparesis ([place]) 03/04/2020  . Chronic otitis externa of right ear 02/20/2020  . Intraparenchymal hemorrhage of brain ([place])   . Delirium   . 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 Stroke (cerebrum) ([place]) 01/26/2020  . Conductive hearing loss of right ear with unrestricted hearing of left ear 05/28/2019  . Hyponatremia 08/31/2017  . Generalized weakness 08/31/2017  . Dehydration 08/31/2017  . UTI (urinary tract infection) 08/31/2017  . Tympanic membrane rupture, right 12/01/2016  . Right ovarian cyst 08/22/2016  . Fibromyalgia 06/03/2015  . Cystocele 03/18/2014  . 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 Diverticulosis of colon without hemorrhage 06/12/2013  . Hypertension   . Osteopenia   . Chronic low back pain 05/12/2011  . HYPERCHOLESTEROLEMIA 03/17/2008  . Anxiety state 09/17/2007  . GERD 09/17/2007  . DJD (degenerative joint disease) 09/17/2007   Ihor [place], LPTA/CLT; CBIS [phone]  Aldona Lento 09/11/2020, 12:50 PM  [facility] [address] [place], [place], [address] Phone: [phone]   Fax:  [phone]  Name: Alison Cuevas MRN: [id number] Date of Birth: 1946-03-13

## 2020-09-14 ENCOUNTER — Encounter (HOSPITAL_COMMUNITY): Payer: Self-pay

## 2020-09-14 ENCOUNTER — Ambulatory Visit (HOSPITAL_COMMUNITY): Payer: Medicare Other

## 2020-09-14 ENCOUNTER — Other Ambulatory Visit: Payer: Self-pay

## 2020-09-14 DIAGNOSIS — M6281 Muscle weakness (generalized): Secondary | ICD-10-CM

## 2020-09-14 DIAGNOSIS — R278 Other lack of coordination: Secondary | ICD-10-CM

## 2020-09-14 DIAGNOSIS — R29898 Other symptoms and signs involving the musculoskeletal system: Secondary | ICD-10-CM

## 2020-09-14 DIAGNOSIS — R2689 Other abnormalities of gait and mobility: Secondary | ICD-10-CM

## 2020-09-14 NOTE — Patient Instructions (Addendum)
Using yellow putty once a day. Complete each exercise for 3-4 minutes.   putty squeeze  Pt. should squeeze putty in hand trying to keep it round by rotating putty after each squeeze. push fingers through putty to palm each time.   PUTTY KEY GRIP  Hold the putty at the top of your hand. Squeeze the putty between your thumb and the side of your 2nd finger as shown.    PUTTY 3 JAW CHUCK  Roll up some putty into a ball then flatten it. Then, firmly squeeze it with your first 3 fingers as shown.

## 2020-09-14 NOTE — Therapy (Signed)
[place], [place], [address] Phone: [phone]   Fax:  [phone]  Occupational Therapy Treatment  Patient Details  Name: Alison Cuevas MRN: [id number] Date of Birth: 06-Aug-1945 Referring Provider (OT): Alger Simons, MD   Encounter Date: 09/14/2020   OT End of Session - 09/14/20 1606    Visit Number 3    Number of Visits 12    Date for OT Re-Evaluation 10/14/20    Authorization Type double coverage Medicare 80/20% secondary: [employer] Covers what medicare doesn't.    Progress Note Due on Visit 10    OT Start Time 1515    OT Stop Time 1554    OT Time Calculation (min) 39 min    Activity Tolerance Patient tolerated treatment well    Behavior During Therapy WFL for tasks assessed/performed           Past Medical History:  Diagnosis Date  . Allergic rhinitis    maple pollen  . Anxiety   . Arthritis   . Bowel obstruction ([place])   . Chronic low back pain    Dr. Trenton Gammon.  Has had back injection--bp elevated after.  . Chronic pain syndrome   . Chronic renal insufficiency, stage 2 (mild)    GFR 60-70  . DDD (degenerative disc disease), cervical    Hx of ACDF (Dr. [name]).  Followed by Dr. [name].  Also, Dr. [name] do left C7-T1 intralaminal epidural injection.  . Diverticulosis of colon   . DJD (degenerative joint disease)   . Gallstones   . GERD (gastroesophageal reflux disease)   . Herpes zoster 07/08/2014  . 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  Resid R arm and leg weakness + flexion contractures.  . Hypercholesteremia    mild; pt declined statin trial 05/2014--needs recheck lipid panel at first f/u visit in 2017  . Hypertension   . Osteopenia 06/2017   T score -1.4 FRAX 15% / 2%  . Ovarian cyst 2017   82019-robotic assisted bilatel SPO: all path benign.  . Perianal dermatitis    prn cutivate  .  Phlebitis   . Right hemiplegia ([place]) 01/2020   R arm and leg (hemorrhagic L cerebral hemisphere CVA)  . Right knee injury 2018   Patellofemoral crush injury--Dr. [name].  . Trochanteric bursitis of right hip    Recurrent (injection by Dr. [name] 05/26/15)  . Tympanic membrane rupture, right 12/01/2016   As of 08/2018 pt set for tympanomastoidectomy and STSG (Dr. [name]). Recurrent fungal/bact OE.  . Varicose vein    left leg     Past Surgical History:  Procedure Laterality Date  . ABDOMINAL HYSTERECTOMY  03/2015   TAH.  Last pap 2016.  No hx of abnl paps.  Per GYN, no further pap smears indicated.  . ANTERIOR AND POSTERIOR REPAIR N/A 03/18/2014   Procedure: Cystocele repair with graft, Vault suspension, Rectocele repair;  Surgeon: [name], MD;  [facility];  Service: Urology;  Laterality: N/A;  . CHOLECYSTECTOMY    . CHOLECYSTECTOMY, LAPAROSCOPIC    . COLON SURGERY    . COLONOSCOPY  04/2006; 05/26/16   2007 (Dr. [name]): Normal.  04/2016 (Dr. [name]) normal except diverticulosis and decreased anal sphincter tone.  No repeat colonoscopy is recommended due to age.  . cspine surgery     Dr. Wiliam Ke level ant cerv discectomy /fusion w/plating  . CYSTO N/A 03/18/2014   Procedure:  CYSTO;  Surgeon: [name], MD;  [facility];  Service: Urology;  Laterality: N/A;  . DEXA  07/30/2015; 06/2018   2017 and 2020 -->Osteopenia--repeat 2 yrs.  Marland Kitchen LYSIS OF ADHESION N/A 01/31/2018   Procedure: POSSIBLE LYSIS OF ADHESION;  Surgeon: Everitt Amber, MD;  [facility];  Service: Gynecology;  Laterality: N/A;  . [place]     with prolasped bledder repair  . RESECTION OF COLON     BENIGN TUMOR  . RIGHT EAR SURGERY  08/23/2017   Dr. 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 right ear surgery  12/12/2018   Right  tympanomastoidectomy, ossiculoplasty with partial prosthesis, split thickness skin graft from postauricular skin 1x1cm, and right tragal cartilage graft 1x1 cm [facility])  . right hemicolectomy for diverticulitis with abscess  1993  . ROBOTIC ASSISTED BILATERAL SALPINGO OOPHERECTOMY Bilateral 01/31/2018   All PATH benign.  Procedure: XI ROBOTIC ASSISTED BILATERAL SALPINGO OOPHORECTOMY;  Surgeon: Everitt Amber, MD;  [facility];  Service: Gynecology;  Laterality: Bilateral;  . TRANSTHORACIC ECHOCARDIOGRAM  01/27/2020   EF 65-70%, NORMAL.  . TUBAL LIGATION      There were no vitals filed for this visit.   Subjective Assessment - 09/14/20 1525    Subjective  S: I think I may have worked my arms a little hard this morning.    Currently in Pain? Yes    Pain Score 2     Pain Location Knee    Pain Orientation Right    Pain Descriptors / Indicators Aching    Pain Type Chronic pain    Pain Onset More than a month ago    Pain Frequency Intermittent    Aggravating Factors  increased use    Pain Relieving Factors rest              [facility] OT Assessment - 09/14/20 1605      Assessment   Medical Diagnosis Right side weakness S/P CVA      Precautions   Precautions Fall                    OT Treatments/Exercises (OP) - 09/14/20 1526      Exercises   Exercises Shoulder;Elbow;Theraputty;Hand      Shoulder Exercises: Seated   Protraction Strengthening;10 reps    Protraction Weight (lbs) 1    Horizontal ABduction Strengthening;10 reps    Horizontal ABduction Weight (lbs) 1    External Rotation Strengthening;10 reps    External Rotation Weight (lbs) 2    Internal Rotation Strengthening;10 reps    Internal Rotation Weight (lbs) 2    Flexion Strengthening;10 reps   to shoulder level   Flexion Weight (lbs) 1    Abduction Strengthening;10 reps   to shoulder level   ABduction Weight (lbs) 1      Elbow Exercises   Other elbow exercises elbow flexion/extension seated 10X; 2#  completed bicep curl and hammer curl      Additional Elbow Exercises   Theraputty - Flatten yellow - seated    Theraputty - Grip yellow      Hand Exercises   Other Hand Exercises Using pvc pipe in right hand, patient pressed circles into flattened yellow putty      Theraputty   Theraputty - Pinch yellow - 3 point and lateral  OT Education - 09/14/20 1547    Education Details yellow theraputty - grip and pinch strength    Person(s) Educated Patient    Methods Explanation;Demonstration;Verbal cues;Handout    Comprehension Returned demonstration;Verbalized understanding            OT Short Term Goals - 09/11/20 0941      OT SHORT TERM GOAL #1   Title Pt will be educated and independent with HEP in order to faciliate progress in therapy and work towards increasing her RUE strength and coordination while increasing her functional performance during ADL tasks.    Time 3    Period Weeks    Status On-going    Target Date 09/23/20             OT Long Term Goals - 09/11/20 0941      OT LONG TERM GOAL #1   Title Patient will increase right shoulder strength and elbow to 5/5 while demonstrating increased shoulder and scapular stability when reaching away from her body/base of support.    Time 6    Period Weeks    Status On-going      OT LONG TERM GOAL #2   Title Patient will increase right hand coordination by completing the 9 hole peg test in 30 seconds or less in order to roll hair with less difficulty.    Time 6    Period Weeks    Status On-going      OT LONG TERM GOAL #3   Title Patient will increase right hand grip strength by 10# and pinch strength by 5# in order to increase ability to grasp and manipulate items with less difficulty.    Time 6    Period Weeks    Status On-going      OT LONG TERM GOAL #4   Title patient will reach highest level of independence with increased confidence and safety awareness while completing all ADL tasks at  modified independent level or better while utilizing adaptive equipment and/or compensatory strategies if needed.    Baseline 3/9: Pt is sponge bathing and does not take a shower unless someone is present for supervision.    Time 6    Period Weeks    Status On-going                 Plan - 09/14/20 1606    Clinical Impression Statement A: floor length mirror used during UB strengthening exercises to provide visual feedback of right upper extremity positioning. Mirror did provide assist although therapist was needed to provide VC as needed for form and technique. Pt presents with decreased proprioception in the right UE when completing exercises or activities. Unable to self correct positioning with only verbal cues and did require hand over hand assist for set up. Provided yellow theraputty for HEP to focus on grip and pinch strength.    Body Structure / Function / Physical Skills ADL;UE functional use;FMC;ROM;Proprioception;Coordination;GMC;Strength    Plan P: Attempt handgripper task for grip strength. proprioception activities    OT Home Exercise Plan Eval: proximal shoulder strengthening; 3/18: shoulder A/ROM 3/21: yellow putty - pinch and grip    Consulted and Agree with Plan of Care Patient           Patient will benefit from skilled therapeutic intervention in order to improve the following deficits and impairments:   Body Structure / Function / Physical Skills: ADL,UE functional use,FMC,ROM,Proprioception,Coordination,GMC,Strength       Visit Diagnosis: Other lack of coordination  Other  symptoms and signs involving the musculoskeletal system    Problem List Patient Active Problem List   Diagnosis Date Noted  . Pain in joint of right foot 04/15/2020  . Right spastic hemiparesis ([place]) 03/04/2020  . Chronic otitis externa of right ear 02/20/2020  . Intraparenchymal hemorrhage of brain ([place])   . Delirium   . 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,  hypertensive 01/26/2020  . Stroke (cerebrum) ([place]) 01/26/2020  . Conductive hearing loss of right ear with unrestricted hearing of left ear 05/28/2019  . Hyponatremia 08/31/2017  . Generalized weakness 08/31/2017  . Dehydration 08/31/2017  . UTI (urinary tract infection) 08/31/2017  . Tympanic membrane rupture, right 12/01/2016  . Right ovarian cyst 08/22/2016  . Fibromyalgia 06/03/2015  . Cystocele 03/18/2014  . 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 Diverticulosis of colon without hemorrhage 06/12/2013  . Hypertension   . Osteopenia   . Chronic low back pain 05/12/2011  . HYPERCHOLESTEROLEMIA 03/17/2008  . Anxiety state 09/17/2007  . GERD 09/17/2007  . DJD (degenerative joint disease) 09/17/2007    Ailene Ravel, OTR/L,CBIS  [phone]  09/14/2020, 4:32 PM  [facility] [address] [place], [place], [address] Phone: [phone]   Fax:  [phone]  Name: Alison Cuevas MRN: [id number] Date of Birth: May 08, 1946

## 2020-09-14 NOTE — Telephone Encounter (Signed)
Spoke with pt's son, Bud and appointment scheduled for [facility] next week on 09/22/20.

## 2020-09-14 NOTE — Therapy (Signed)
[facility] [place], [place], [address] Phone: [phone]   Fax:  [phone]  Physical Therapy Treatment  Patient Details  Name: Alison Cuevas MRN: [id number] Date of Birth: [birth year] Referring Provider (PT): Alger Simons MD   Encounter Date: 09/14/2020   PT End of Session - 09/14/20 1503    Visit Number 4    Number of Visits 16    Date for PT Re-Evaluation 10/28/20    Authorization Type Medicare A/ [place] NAP 2ndary    Progress Note Due on Visit 10    PT Start Time 1435    PT Stop Time 1515    PT Time Calculation (min) 40 min    Activity Tolerance Patient tolerated treatment well    Behavior During Therapy [facility] for tasks assessed/performed           Past Medical History:  Diagnosis Date  . Allergic rhinitis    maple pollen  . Anxiety   . Arthritis   . Bowel obstruction ([place])   . Chronic low back pain    Dr. Trenton Gammon.  Has had back injection--bp elevated after.  . Chronic pain syndrome   . Chronic renal insufficiency, stage 2 (mild)    GFR 60-70  . DDD (degenerative disc disease), cervical    Hx of ACDF (Dr. [name]).  Followed by Dr. [name].  Also, Dr. [name] do left C7-T1 intralaminal epidural injection.  . Diverticulosis of colon   . DJD (degenerative joint disease)   . Gallstones   . GERD (gastroesophageal reflux disease)   . Herpes zoster 07/08/2014  . 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  Resid R arm and leg weakness + flexion contractures.  . Hypercholesteremia    mild; pt declined statin trial 05/2014--needs recheck lipid panel at first f/u visit in 2017  . Hypertension   . Osteopenia 06/2017   T score -1.4 FRAX 15% / 2%  . Ovarian cyst 2017   82019-robotic assisted bilatel SPO: all path benign.  . Perianal dermatitis    prn cutivate  . Phlebitis   . Right hemiplegia ([place]) 01/2020   R arm and leg  (hemorrhagic L cerebral hemisphere CVA)  . Right knee injury 2018   Patellofemoral crush injury--Dr. [name].  . Trochanteric bursitis of right hip    Recurrent (injection by Dr. [name] 05/26/15)  . Tympanic membrane rupture, right 12/01/2016   As of 08/2018 pt set for tympanomastoidectomy and STSG (Dr. [name]). Recurrent fungal/bact OE.  . Varicose vein    left leg     Past Surgical History:  Procedure Laterality Date  . ABDOMINAL HYSTERECTOMY  03/2015   TAH.  Last pap 2016.  No hx of abnl paps.  Per GYN, no further pap smears indicated.  . ANTERIOR AND POSTERIOR REPAIR N/A 03/18/2014   Procedure: Cystocele repair with graft, Vault suspension, Rectocele repair;  Surgeon: [name], MD;  Location: [facility];  Service: Urology;  Laterality: N/A;  . CHOLECYSTECTOMY    . CHOLECYSTECTOMY, LAPAROSCOPIC    . COLON SURGERY    . COLONOSCOPY  04/2006; 05/26/16   2007 (Dr. [name]): Normal.  04/2016 (Dr. [name]) normal except diverticulosis and decreased anal sphincter tone.  No repeat colonoscopy is recommended due to age.  . cspine surgery     Dr. Wiliam Ke level ant cerv discectomy /fusion w/plating  . CYSTO N/A 03/18/2014   Procedure: CYSTO;  Surgeon: Elayne Snare  MacDiarmid, MD;  Location: [facility];  Service: Urology;  Laterality: N/A;  . DEXA  07/30/2015; 06/2018   2017 and 2020 -->Osteopenia--repeat 2 yrs.  Marland Kitchen LYSIS OF ADHESION N/A 01/31/2018   Procedure: POSSIBLE LYSIS OF ADHESION;  Surgeon: Everitt Amber, MD;  Location: [facility];  Service: Gynecology;  Laterality: N/A;  . [place]     with prolasped bledder repair  . RESECTION OF COLON     BENIGN TUMOR  . RIGHT EAR SURGERY  08/23/2017   Dr. 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 right ear surgery  12/12/2018   Right tympanomastoidectomy, ossiculoplasty with partial prosthesis, split thickness  skin graft from postauricular skin 1x1cm, and right tragal cartilage graft 1x1 cm [facility])  . right hemicolectomy for diverticulitis with abscess  1993  . ROBOTIC ASSISTED BILATERAL SALPINGO OOPHERECTOMY Bilateral 01/31/2018   All PATH benign.  Procedure: XI ROBOTIC ASSISTED BILATERAL SALPINGO OOPHORECTOMY;  Surgeon: Everitt Amber, MD;  [facility];  Service: Gynecology;  Laterality: Bilateral;  . TRANSTHORACIC ECHOCARDIOGRAM  01/27/2020   EF 65-70%, NORMAL.  . TUBAL LIGATION      There were no vitals filed for this visit.   Subjective Assessment - 09/14/20 1441    Subjective Reports she is feeling fine and wants to improve her walking    Pertinent History LT CVA 01/26/20    Currently in Pain? Yes    Pain Score 2     Pain Location Knee    Pain Orientation Right    Pain Descriptors / Indicators Aching    Pain Type Chronic pain                             [facility] Adult PT Treatment/Exercise - 09/14/20 0001      Ambulation/Gait   Ambulation/Gait Yes    Ambulation/Gait Assistance 4: Min guard    Ambulation Distance (Feet) 30 Feet    Assistive device Rolling walker    Gait Pattern Decreased step length - left;Decreased stance time - right;Decreased stride length;Decreased dorsiflexion - right;Step-to pattern    Gait Comments techniuqes to facilitate right hip extension      Knee/Hip Exercises: Stretches   Other Knee/Hip Stretches right hip adductor stretch 4x60 sec to decrease tone/spasticity      Knee/Hip Exercises: Standing   Other Standing Knee Exercises offset standing with LLE elevated on 6" step and cues to RLE for hip/knee extension 4x15 sec    Other Standing Knee Exercises standing posture assessment with notable levoscoliosis and right hip hike as result, 1/2 to 3/4 in shim under right foot which improves hip height and knee height algnment      Knee/Hip Exercises: Supine   Other Supine Knee/Hip Exercises manually resisted hip flexion 3x10 reps alternating  fast, unresisted hip/knee extension to improve gross motor coordination    Other Supine Knee/Hip Exercises manually resisted hip abduction/ER with hip/knee flexed 3x10                  PT Education - 09/14/20 1502    Education Details education on benefits of shoe lift to accommodate functional leg length discrepancy from scoliosis and discussion with companies that perform shoe modification    Person(s) Educated Patient    Methods Explanation    Comprehension Verbalized understanding  PT Short Term Goals - 09/09/20 1551      PT SHORT TERM GOAL #1   Title Patient will be independent with initial HEP and self-management strategies to improve functional outcomes    Time 4    Period Weeks    Status On-going    Target Date 09/30/20      PT SHORT TERM GOAL #2   Title Patient will be able to ambulate at least 100 feet during 2MWT with LRAD to demonstrate improved ability to perform functional mobility and associated tasks.    Time 4    Period Weeks    Status On-going    Target Date 09/30/20             PT Long Term Goals - 09/09/20 1551      PT LONG TERM GOAL #1   Title Patient will be able to ambulate at least 225 feet during 2MWT with LRAD to demonstrate improved ability to perform functional mobility and associated tasks.    Time 8    Period Weeks    Status On-going      PT LONG TERM GOAL #2   Title Patient will have equal to or > 4/5 MMT throughout RLE to improve ability to perform functional mobility, stair ambulation and ADLs.    Time 8    Period Weeks    Status On-going      PT LONG TERM GOAL #3   Title Patient will be able to maintain semi tandem stance >30 seconds on BLEs to improve stability and reduce risk for falls    Time 8    Period Weeks    Status On-going                 Plan - 09/14/20 1532    Clinical Impression Statement Demonstrates RLE gross and selective motor control deficits and difficulty with coordination.   Notable hypertonicity in right adductors and weakness in right hip abduction/external rotation appreciated.  Frequent cues for recruitment and isolation of movements.  Provided with info on shoe lift modification for RLE to possibly accommodate a functional leg length discrepancy from scoliosis.    Personal Factors and Comorbidities Age;Comorbidity 1    Comorbidities CVA    Examination-Activity Limitations Locomotion Level;Bed Mobility;Stand;Stairs;Transfers    Examination-Participation Restrictions Community Activity;Yard Work;Laundry;Driving;Cleaning    Stability/Clinical Decision Making Stable/Uncomplicated    Rehab Potential Good    PT Frequency 2x / week    PT Duration 8 weeks    PT Treatment/Interventions ADLs/Self Care Home Management;Aquatic Therapy;Iontophoresis 4mg /ml Dexamethasone;DME Instruction;Balance training;Passive range of motion;Scar mobilization;Neuromuscular re-education;Gait training;Stair training;Functional mobility training;Patient/family education;Manual techniques;Dry needling;Energy conservation;Manual lymph drainage;Parrafin;Ultrasound;Therapeutic activities;Canalith Repostioning;Moist Heat;Traction;Biofeedback;Cryotherapy;Fluidtherapy;Electrical Stimulation;Contrast Bath;Therapeutic exercise;Orthotic Fit/Training;Compression bandaging;Splinting;Taping;Vasopneumatic Device;Joint Manipulations;Spinal Manipulations;Visual/perceptual remediation/compensation;Vestibular    PT Next Visit Plan Progress HEP next session, give printout.  Progress standing activity.   Progress dynamic balance, and giat as tolerated    PT Home Exercise Plan Eval: seated march, heel/toe raise, LAQs    Consulted and Agree with Plan of Care Patient           Patient will benefit from skilled therapeutic intervention in order to improve the following deficits and impairments:  Pain,Improper body mechanics,Abnormal gait,Decreased balance,Decreased activity tolerance,Decreased range of motion,Decreased  strength,Hypomobility,Decreased mobility,Impaired sensation,Difficulty walking  Visit Diagnosis: Other abnormalities of gait and mobility  Muscle weakness (generalized)  Other lack of coordination  Other symptoms and signs involving the musculoskeletal system     Problem List Patient Active Problem List   Diagnosis Date Noted  .  Pain in joint of right foot 04/15/2020  . Right spastic hemiparesis ([place]) 03/04/2020  . Chronic otitis externa of right ear 02/20/2020  . Intraparenchymal hemorrhage of brain ([place])   . Delirium   . 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 Stroke (cerebrum) ([place]) 01/26/2020  . Conductive hearing loss of right ear with unrestricted hearing of left ear 05/28/2019  . Hyponatremia 08/31/2017  . Generalized weakness 08/31/2017  . Dehydration 08/31/2017  . UTI (urinary tract infection) 08/31/2017  . Tympanic membrane rupture, right 12/01/2016  . Right ovarian cyst 08/22/2016  . Fibromyalgia 06/03/2015  . Cystocele 03/18/2014  . 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 Diverticulosis of colon without hemorrhage 06/12/2013  . Hypertension   . Osteopenia   . Chronic low back pain 05/12/2011  . HYPERCHOLESTEROLEMIA 03/17/2008  . Anxiety state 09/17/2007  . GERD 09/17/2007  . DJD (degenerative joint disease) 09/17/2007   3:39 PM, 09/14/20 M. Sherlyn Lees, PT, DPT Physical Therapist- [place] Office Number: [phone]  [facility] [address] [place], [place], [address] Phone: [phone]   Fax:  [phone]  Name: Alison Cuevas MRN: [id number] Date of Birth: 27-Jun-1946

## 2020-09-15 ENCOUNTER — Telehealth: Payer: Self-pay

## 2020-09-15 MED ORDER — HYDROCODONE-ACETAMINOPHEN 5-325 MG PO TABS: ORAL_TABLET | ORAL | 0 refills | Status: DC

## 2020-09-15 NOTE — Telephone Encounter (Signed)
Patient refill request.   HYDROcodone-acetaminophen (NORCO/VICODIN) 5-325 MG tablet [[id number]]    [id number] - [place], [place]

## 2020-09-15 NOTE — Telephone Encounter (Signed)
Information below added to 3/15 phone note regarding same request.

## 2020-09-15 NOTE — Addendum Note (Signed)
Addended by: [name] on: 09/15/2020 12:11 PM   Modules accepted: Orders

## 2020-09-15 NOTE — Telephone Encounter (Signed)
Agree with plan for f/u. Also, meloxicam does not cause strokes so it is ok to take this med. I'll send in some hydrocodone but remind her that this can cause drowsiness.

## 2020-09-15 NOTE — Telephone Encounter (Signed)
Pt advised refill sent, follow up recommendations.

## 2020-09-15 NOTE — Telephone Encounter (Signed)
  Hopkins, Dana 6 minutes ago (10:54 AM)     Patient refill request.   HYDROcodone-acetaminophen (NORCO/VICODIN) 5-325 MG tablet [[id number]]    [id number] - [place], [place]      Contract: 03/06/19 UDS: 04/03/19 Last Visit: 05/25/20 Next Visit: 09/22/20 Last Refill: 02/20/20(30,0) Reesa Chew, PA and last refill by PCP was 09/25/19(90,0) stopped at discharge.    See messages below. Please Advise

## 2020-09-16 ENCOUNTER — Ambulatory Visit (HOSPITAL_COMMUNITY): Payer: Medicare Other | Admitting: Physical Therapy

## 2020-09-17 ENCOUNTER — Other Ambulatory Visit: Payer: Self-pay

## 2020-09-17 ENCOUNTER — Ambulatory Visit (HOSPITAL_COMMUNITY): Payer: Medicare Other

## 2020-09-17 ENCOUNTER — Encounter (HOSPITAL_COMMUNITY): Payer: Self-pay

## 2020-09-17 ENCOUNTER — Ambulatory Visit (HOSPITAL_COMMUNITY): Payer: Medicare Other | Admitting: Occupational Therapy

## 2020-09-17 ENCOUNTER — Encounter (HOSPITAL_COMMUNITY): Payer: Self-pay | Admitting: Occupational Therapy

## 2020-09-17 DIAGNOSIS — M6281 Muscle weakness (generalized): Secondary | ICD-10-CM

## 2020-09-17 DIAGNOSIS — R2689 Other abnormalities of gait and mobility: Secondary | ICD-10-CM

## 2020-09-17 DIAGNOSIS — R278 Other lack of coordination: Secondary | ICD-10-CM | POA: Diagnosis not present

## 2020-09-17 DIAGNOSIS — R29898 Other symptoms and signs involving the musculoskeletal system: Secondary | ICD-10-CM

## 2020-09-17 NOTE — Patient Instructions (Signed)
Functional Quadriceps: Sit to Stand    Sit on edge of chair, feet flat on floor. Stand upright, extending knees fully. Repeat 10 times per set. Do 2 sets per day.  http://orth.exer.us/735   Copyright  VHI. All rights reserved.   Marching in Place: Varied Surfaces   Stand in front of counter for safety March in place, slowly lifting knees toward ceiling. Repeat 10 times per session. Do 2 sessions per day.  Copyright  VHI. All rights reserved.    ABDUCTION: Standing (Active)    Stand, feet flat. Lift right leg out to side.  Complete 2 sets of 10 repetitions.   http://gtsc.exer.us/111   Copyright  VHI. All rights reserved.

## 2020-09-17 NOTE — Therapy (Signed)
[facility] [place], [place], [address] Phone: [phone]   Fax:  [phone]  Occupational Therapy Treatment  Patient Details  Name: Alison Cuevas MRN: [id number] Date of Birth: February 12, 1946 Referring Provider (OT): Alger Simons, MD   Encounter Date: 09/17/2020   OT End of Session - 09/17/20 0934    Visit Number 4    Number of Visits 12    Date for OT Re-Evaluation 10/14/20    Authorization Type double coverage Medicare 80/20% secondary: [employer] Covers what medicare doesn't.    Progress Note Due on Visit 10    OT Start Time [phone]    OT Stop Time [phone]    OT Time Calculation (min) 39 min    Activity Tolerance Patient tolerated treatment well    Behavior During Therapy [facility] for tasks assessed/performed           Past Medical History:  Diagnosis Date  . Allergic rhinitis    maple pollen  . Anxiety   . Arthritis   . Bowel obstruction ([place])   . Chronic low back pain    Dr. Trenton Gammon.  Has had back injection--bp elevated after.  . Chronic pain syndrome   . Chronic renal insufficiency, stage 2 (mild)    GFR 60-70  . DDD (degenerative disc disease), cervical    Hx of ACDF (Dr. [name]).  Followed by Dr. [name].  Also, Dr. [name] do left C7-T1 intralaminal epidural injection.  . Diverticulosis of colon   . DJD (degenerative joint disease)   . Gallstones   . GERD (gastroesophageal reflux disease)   . Herpes zoster 07/08/2014  . 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  Resid R arm and leg weakness + flexion contractures.  . Hypercholesteremia    mild; pt declined statin trial 05/2014--needs recheck lipid panel at first f/u visit in 2017  . Hypertension   . Osteopenia 06/2017   T score -1.4 FRAX 15% / 2%  . Ovarian cyst 2017   82019-robotic assisted bilatel SPO: all path benign.  . Perianal dermatitis    prn cutivate  .  Phlebitis   . Right hemiplegia ([place]) 01/2020   R arm and leg (hemorrhagic L cerebral hemisphere CVA)  . Right knee injury 2018   Patellofemoral crush injury--Dr. [name].  . Trochanteric bursitis of right hip    Recurrent (injection by Dr. [name] 05/26/15)  . Tympanic membrane rupture, right 12/01/2016   As of 08/2018 pt set for tympanomastoidectomy and STSG (Dr. [name]). Recurrent fungal/bact OE.  . Varicose vein    left leg     Past Surgical History:  Procedure Laterality Date  . ABDOMINAL HYSTERECTOMY  03/2015   TAH.  Last pap 2016.  No hx of abnl paps.  Per GYN, no further pap smears indicated.  . ANTERIOR AND POSTERIOR REPAIR N/A 03/18/2014   Procedure: Cystocele repair with graft, Vault suspension, Rectocele repair;  Surgeon: [name], MD;  [facility]: [facility];  Service: Urology;  Laterality: N/A;  . CHOLECYSTECTOMY    . CHOLECYSTECTOMY, LAPAROSCOPIC    . COLON SURGERY    . COLONOSCOPY  04/2006; 05/26/16   2007 (Dr. [name]): Normal.  04/2016 (Dr. [name]) normal except diverticulosis and decreased anal sphincter tone.  No repeat colonoscopy is recommended due to age.  . cspine surgery     Dr. Wiliam Ke level ant cerv discectomy /fusion w/plating  . CYSTO N/A 03/18/2014   Procedure:  CYSTO;  Surgeon: [name], MD;  Location: [facility];  Service: Urology;  Laterality: N/A;  . DEXA  07/30/2015; 06/2018   2017 and 2020 -->Osteopenia--repeat 2 yrs.  Marland Kitchen LYSIS OF ADHESION N/A 01/31/2018   Procedure: POSSIBLE LYSIS OF ADHESION;  Surgeon: Everitt Amber, MD;  Location: [facility];  Service: Gynecology;  Laterality: N/A;  . [place]     with prolasped bledder repair  . RESECTION OF COLON     BENIGN TUMOR  . RIGHT EAR SURGERY  08/23/2017   Dr. 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 right ear surgery  12/12/2018   Right  tympanomastoidectomy, ossiculoplasty with partial prosthesis, split thickness skin graft from postauricular skin 1x1cm, and right tragal cartilage graft 1x1 cm [facility])  . right hemicolectomy for diverticulitis with abscess  1993  . ROBOTIC ASSISTED BILATERAL SALPINGO OOPHERECTOMY Bilateral 01/31/2018   All PATH benign.  Procedure: XI ROBOTIC ASSISTED BILATERAL SALPINGO OOPHORECTOMY;  Surgeon: Everitt Amber, MD;  [facility];  Service: Gynecology;  Laterality: Bilateral;  . TRANSTHORACIC ECHOCARDIOGRAM  01/27/2020   EF 65-70%, NORMAL.  . TUBAL LIGATION      There were no vitals filed for this visit.   Subjective Assessment - 09/17/20 0856    Subjective  S: I'm feeling a little tired today.    Currently in Pain? Yes    Pain Score 5     Pain Location Leg    Pain Orientation Right    Pain Descriptors / Indicators Aching    Pain Type Chronic pain    Pain Radiating Towards N/A    Pain Onset More than a month ago    Pain Frequency Intermittent    Aggravating Factors  worse in the mornings    Pain Relieving Factors rest    Effect of Pain on Daily Activities mod effect on ADLs    Multiple Pain Sites No              [facility] OT Assessment - 09/17/20 0855      Assessment   Medical Diagnosis Right side weakness S/P CVA      Precautions   Precautions Fall                    OT Treatments/Exercises (OP) - 09/17/20 0900      Exercises   Exercises Shoulder;Elbow;Theraputty;Hand      Shoulder Exercises: Seated   Protraction Strengthening;10 reps    Protraction Weight (lbs) 1    Horizontal ABduction Strengthening;10 reps    Horizontal ABduction Weight (lbs) 1    External Rotation Strengthening;10 reps    External Rotation Weight (lbs) 2    Internal Rotation Strengthening;10 reps    Internal Rotation Weight (lbs) 2    Flexion Strengthening;10 reps    Flexion Weight (lbs) 1    Abduction Strengthening;10 reps    ABduction Weight (lbs) 1      Elbow Exercises   Other  elbow exercises elbow flexion/extension seated 10X; 2# completed bicep curl and hammer curl      Additional Elbow Exercises   Hand Gripper with Large Beads all beads gripper at 29#, vertical    Hand Gripper with Medium Beads all beads gripper at 29#, horizontal      Hand Exercises   Sponges Pt using tip pinch to grasp and stack 2 piles of 5 sponges. Working  on controlled movements, cuing for placing elbow/forearm on table for stability.      Neurological Re-education Exercises   Other Exercises 1 proximal shoulder strengthening in sitting: 10X each, no rest breaks                  OT Education - 09/17/20 0926    Education Details shoulder strengthening; educated on stopping exercises provided at previous therapy venues and only completing ours as pt is wearing out her RUE completing all exercises    Person(s) Educated Patient    Methods Explanation;Demonstration;Verbal cues;Handout    Comprehension Returned demonstration;Verbalized understanding            OT Short Term Goals - 09/11/20 0941      OT SHORT TERM GOAL #1   Title Pt will be educated and independent with HEP in order to faciliate progress in therapy and work towards increasing her RUE strength and coordination while increasing her functional performance during ADL tasks.    Time 3    Period Weeks    Status On-going    Target Date 09/23/20             OT Long Term Goals - 09/11/20 0941      OT LONG TERM GOAL #1   Title Patient will increase right shoulder strength and elbow to 5/5 while demonstrating increased shoulder and scapular stability when reaching away from her body/base of support.    Time 6    Period Weeks    Status On-going      OT LONG TERM GOAL #2   Title Patient will increase right hand coordination by completing the 9 hole peg test in 30 seconds or less in order to roll hair with less difficulty.    Time 6    Period Weeks    Status On-going      OT LONG TERM GOAL #3   Title  Patient will increase right hand grip strength by 10# and pinch strength by 5# in order to increase ability to grasp and manipulate items with less difficulty.    Time 6    Period Weeks    Status On-going      OT LONG TERM GOAL #4   Title patient will reach highest level of independence with increased confidence and safety awareness while completing all ADL tasks at modified independent level or better while utilizing adaptive equipment and/or compensatory strategies if needed.    Baseline 3/9: Pt is sponge bathing and does not take a shower unless someone is present for supervision.    Time 6    Period Weeks    Status On-going                 Plan - 09/17/20 4098    Clinical Impression Statement A: Continued using floor length mirror for feedback during UB strengthening, pt with good motor planning, only requiring OTs visual cues for abduction today.Continued with proximal shoulder strengthening.  Added hand gripper task for grip strengthening, occasional rest breaks for fatigue. Pt working on fine motor task of sponge stacking, increased time for proprioception and control.    Body Structure / Function / Physical Skills ADL;UE functional use;FMC;ROM;Proprioception;Coordination;GMC;Strength    Plan P: Follow up on HEP, continue with RUE strengthening, complete fine motor task-trial in-hand manipulation with pennies.    OT Home Exercise Plan Eval: proximal shoulder strengthening; 3/18: shoulder A/ROM 3/21: yellow putty - pinch and grip; 3/24: shoulder strengthening (provided A/ROM HEP again)  Consulted and Agree with Plan of Care Patient           Patient will benefit from skilled therapeutic intervention in order to improve the following deficits and impairments:   Body Structure / Function / Physical Skills: ADL,UE functional use,FMC,ROM,Proprioception,Coordination,GMC,Strength       Visit Diagnosis: Other lack of coordination  Other symptoms and signs involving the  musculoskeletal system    Problem List Patient Active Problem List   Diagnosis Date Noted  . Pain in joint of right foot 04/15/2020  . Right spastic hemiparesis ([place]) 03/04/2020  . Chronic otitis externa of right ear 02/20/2020  . Intraparenchymal hemorrhage of brain ([place])   . Delirium   . 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 Stroke (cerebrum) ([place]) 01/26/2020  . Conductive hearing loss of right ear with unrestricted hearing of left ear 05/28/2019  . Hyponatremia 08/31/2017  . Generalized weakness 08/31/2017  . Dehydration 08/31/2017  . UTI (urinary tract infection) 08/31/2017  . Tympanic membrane rupture, right 12/01/2016  . Right ovarian cyst 08/22/2016  . Fibromyalgia 06/03/2015  . Cystocele 03/18/2014  . 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 Diverticulosis of colon without hemorrhage 06/12/2013  . Hypertension   . Osteopenia   . Chronic low back pain 05/12/2011  . HYPERCHOLESTEROLEMIA 03/17/2008  . Anxiety state 09/17/2007  . GERD 09/17/2007  . DJD (degenerative joint disease) 09/17/2007   Guadelupe Sabin, OTR/L  [phone] 09/17/2020, 9:39 AM  [facility] [place], [place], [address] Phone: [phone]   Fax:  [phone]  Name: Alison Cuevas MRN: [id number] Date of Birth: [birth year]

## 2020-09-17 NOTE — Therapy (Signed)
[place], [place], [address] Phone: [phone]   Fax:  [phone]  Physical Therapy Treatment  Patient Details  Name: Alison Cuevas MRN: [id number] Date of Birth: 1945/08/17 Referring Provider (PT): Alger Simons MD   Encounter Date: 09/17/2020   PT End of Session - 09/17/20 1005    Visit Number 5    Number of Visits 16    Date for PT Re-Evaluation 10/28/20    Authorization Type Medicare A/ [place] NAP 2ndary    Progress Note Due on Visit 10    PT Start Time 1003    PT Stop Time 1046    PT Time Calculation (min) 43 min    Activity Tolerance Patient tolerated treatment well    Behavior During Therapy [facility] for tasks assessed/performed           Past Medical History:  Diagnosis Date  . Allergic rhinitis    maple pollen  . Anxiety   . Arthritis   . Bowel obstruction ([place])   . Chronic low back pain    Dr. Trenton Gammon.  Has had back injection--bp elevated after.  . Chronic pain syndrome   . Chronic renal insufficiency, stage 2 (mild)    GFR 60-70  . DDD (degenerative disc disease), cervical    Hx of ACDF (Dr. [name]).  Followed by Dr. [name].  Also, Dr. [name] do left C7-T1 intralaminal epidural injection.  . Diverticulosis of colon   . DJD (degenerative joint disease)   . Gallstones   . GERD (gastroesophageal reflux disease)   . Herpes zoster 07/08/2014  . 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  Resid R arm and leg weakness + flexion contractures.  . Hypercholesteremia    mild; pt declined statin trial 05/2014--needs recheck lipid panel at first f/u visit in 2017  . Hypertension   . Osteopenia 06/2017   T score -1.4 FRAX 15% / 2%  . Ovarian cyst 2017   82019-robotic assisted bilatel SPO: all path benign.  . Perianal dermatitis    prn cutivate  . Phlebitis   . Right hemiplegia ([place]) 01/2020   R arm and leg  (hemorrhagic L cerebral hemisphere CVA)  . Right knee injury 2018   Patellofemoral crush injury--Dr. [name].  . Trochanteric bursitis of right hip    Recurrent (injection by Dr. [name] 05/26/15)  . Tympanic membrane rupture, right 12/01/2016   As of 08/2018 pt set for tympanomastoidectomy and STSG (Dr. [name]). Recurrent fungal/bact OE.  . Varicose vein    left leg     Past Surgical History:  Procedure Laterality Date  . ABDOMINAL HYSTERECTOMY  03/2015   TAH.  Last pap 2016.  No hx of abnl paps.  Per GYN, no further pap smears indicated.  . ANTERIOR AND POSTERIOR REPAIR N/A 03/18/2014   Procedure: Cystocele repair with graft, Vault suspension, Rectocele repair;  Surgeon: [name], MD;  Location: [facility];  Service: Urology;  Laterality: N/A;  . CHOLECYSTECTOMY    . CHOLECYSTECTOMY, LAPAROSCOPIC    . COLON SURGERY    . COLONOSCOPY  04/2006; 05/26/16   2007 (Dr. [name]): Normal.  04/2016 (Dr. [name]) normal except diverticulosis and decreased anal sphincter tone.  No repeat colonoscopy is recommended due to age.  . cspine surgery     Dr. Wiliam Ke level ant cerv discectomy /fusion w/plating  . CYSTO N/A 03/18/2014   Procedure: CYSTO;  Surgeon: Elayne Snare  MacDiarmid, MD;  Location: [facility];  Service: Urology;  Laterality: N/A;  . DEXA  07/30/2015; 06/2018   2017 and 2020 -->Osteopenia--repeat 2 yrs.  Marland Kitchen LYSIS OF ADHESION N/A 01/31/2018   Procedure: POSSIBLE LYSIS OF ADHESION;  Surgeon: Everitt Amber, MD;  Location: [facility];  Service: Gynecology;  Laterality: N/A;  . [place]     with prolasped bledder repair  . RESECTION OF COLON     BENIGN TUMOR  . RIGHT EAR SURGERY  08/23/2017   Dr. 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 right ear surgery  12/12/2018   Right tympanomastoidectomy, ossiculoplasty with partial prosthesis, split thickness  skin graft from postauricular skin 1x1cm, and right tragal cartilage graft 1x1 cm [facility])  . right hemicolectomy for diverticulitis with abscess  1993  . ROBOTIC ASSISTED BILATERAL SALPINGO OOPHERECTOMY Bilateral 01/31/2018   All PATH benign.  Procedure: XI ROBOTIC ASSISTED BILATERAL SALPINGO OOPHORECTOMY;  Surgeon: Everitt Amber, MD;  [facility];  Service: Gynecology;  Laterality: Bilateral;  . TRANSTHORACIC ECHOCARDIOGRAM  01/27/2020   EF 65-70%, NORMAL.  . TUBAL LIGATION      There were no vitals filed for this visit.   Subjective Assessment - 09/17/20 1003    Subjective Pt stated she is feeling good, has been doing her exercises regularly.  Stated the stretches complete last sessoin felt good.  Wondering which AFO to wear, the one she's wearing today is uncomfortable on front of leg.    Pertinent History LT CVA 01/26/20    Currently in Pain? Yes    Pain Score 5     Pain Location Leg    Pain Orientation Right;Posterior    Pain Descriptors / Indicators Aching;Sore    Pain Type Chronic pain    Pain Onset More than a month ago    Pain Frequency Intermittent    Aggravating Factors  doing stuff aorund the house    Pain Relieving Factors rest    Effect of Pain on Daily Activities mod effects on ADLs                             [facility] Adult PT Treatment/Exercise - 09/17/20 1028      Knee/Hip Exercises: Stretches   Other Knee/Hip Stretches right hip adductor stretch 4x60 sec to decrease tone/spasticity      Knee/Hip Exercises: Standing   Hip Flexion 10 reps;Knee bent    Hip Flexion Limitations alternating marching BUE support 3" holds    Gait Training Ambulated with RW through session 4 sets x 30-50 ft    Other Standing Knee Exercises offset standing with LLE elevated on 6" step and cues to RLE for hip/knee extension 4x15 sec    Other Standing Knee Exercises sidestep 3RT front of table; tandem stance 3x 30"      Knee/Hip Exercises: Seated   Sit to Sand 10  reps;without UE support   elevated height 21in     Knee/Hip Exercises: Supine   Other Supine Knee/Hip Exercises manually resisted hip flexion 3x10 reps alternating fast, unresisted hip/knee extension to improve gross motor coordination                      PT Short Term Goals - 09/09/20 1551      PT SHORT TERM GOAL #1   Title Patient  will be independent with initial HEP and self-management strategies to improve functional outcomes    Time 4    Period Weeks    Status On-going    Target Date 09/30/20      PT SHORT TERM GOAL #2   Title Patient will be able to ambulate at least 100 feet during 2MWT with LRAD to demonstrate improved ability to perform functional mobility and associated tasks.    Time 4    Period Weeks    Status On-going    Target Date 09/30/20             PT Long Term Goals - 09/09/20 1551      PT LONG TERM GOAL #1   Title Patient will be able to ambulate at least 225 feet during 2MWT with LRAD to demonstrate improved ability to perform functional mobility and associated tasks.    Time 8    Period Weeks    Status On-going      PT LONG TERM GOAL #2   Title Patient will have equal to or > 4/5 MMT throughout RLE to improve ability to perform functional mobility, stair ambulation and ADLs.    Time 8    Period Weeks    Status On-going      PT LONG TERM GOAL #3   Title Patient will be able to maintain semi tandem stance >30 seconds on BLEs to improve stability and reduce risk for falls    Time 8    Period Weeks    Status On-going                 Plan - 09/17/20 1050    Clinical Impression Statement Discussed importance of proper fit wiht AFO as pt c/o pressure anterior shin and reports of redness upon removal.  Encouraged to wear other brace as reports no discomfort.  Session focus on balance and functional strengthening.  Min-mod A wiht static balance activities, increased support required with Rt LE weight bearing.    Personal Factors and  Comorbidities Age;Comorbidity 1    Comorbidities CVA    Examination-Activity Limitations Locomotion Level;Bed Mobility;Stand;Stairs;Transfers    Examination-Participation Restrictions Community Activity;Yard Work;Laundry;Driving;Cleaning    Stability/Clinical Decision Making Stable/Uncomplicated    Clinical Decision Making Low    Rehab Potential Good    PT Frequency 2x / week    PT Duration 8 weeks    PT Treatment/Interventions ADLs/Self Care Home Management;Aquatic Therapy;Iontophoresis 4mg /ml Dexamethasone;DME Instruction;Balance training;Passive range of motion;Scar mobilization;Neuromuscular re-education;Gait training;Stair training;Functional mobility training;Patient/family education;Manual techniques;Dry needling;Energy conservation;Manual lymph drainage;Parrafin;Ultrasound;Therapeutic activities;Canalith Repostioning;Moist Heat;Traction;Biofeedback;Cryotherapy;Fluidtherapy;Electrical Stimulation;Contrast Bath;Therapeutic exercise;Orthotic Fit/Training;Compression bandaging;Splinting;Taping;Vasopneumatic Device;Joint Manipulations;Spinal Manipulations;Visual/perceptual remediation/compensation;Vestibular    PT Next Visit Plan Progress HEP next session, give printout.  Progress standing activity.   Progress dynamic balance, and giat as tolerated    PT Home Exercise Plan Eval: seated march, heel/toe raise, LAQs; 09/17/20: STS, standing abduction and marching wiht UE support counter    Consulted and Agree with Plan of Care Patient           Patient will benefit from skilled therapeutic intervention in order to improve the following deficits and impairments:  Pain,Improper body mechanics,Abnormal gait,Decreased balance,Decreased activity tolerance,Decreased range of motion,Decreased strength,Hypomobility,Decreased mobility,Impaired sensation,Difficulty walking  Visit Diagnosis: Other abnormalities of gait and mobility  Muscle weakness (generalized)     Problem List Patient Active  Problem List   Diagnosis Date Noted  . Pain in joint of right foot 04/15/2020  . Right spastic hemiparesis ([place]) 03/04/2020  . Chronic otitis externa  of right ear 02/20/2020  . Intraparenchymal hemorrhage of brain ([place])   . Delirium   . 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 Stroke (cerebrum) ([place]) 01/26/2020  . Conductive hearing loss of right ear with unrestricted hearing of left ear 05/28/2019  . Hyponatremia 08/31/2017  . Generalized weakness 08/31/2017  . Dehydration 08/31/2017  . UTI (urinary tract infection) 08/31/2017  . Tympanic membrane rupture, right 12/01/2016  . Right ovarian cyst 08/22/2016  . Fibromyalgia 06/03/2015  . Cystocele 03/18/2014  . 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 Diverticulosis of colon without hemorrhage 06/12/2013  . Hypertension   . Osteopenia   . Chronic low back pain 05/12/2011  . HYPERCHOLESTEROLEMIA 03/17/2008  . Anxiety state 09/17/2007  . GERD 09/17/2007  . DJD (degenerative joint disease) 09/17/2007   Ihor Austin, LPTA/CLT; CBIS [phone]  Aldona Lento 09/17/2020, 11:06 AM  [place], [place], [address] Phone: [phone]   Fax:  [phone]  Name: Alison Cuevas MRN: [id number] Date of Birth: 1945-10-19

## 2020-09-17 NOTE — Patient Instructions (Signed)
  Repeat all exercises 10-15 times, 1-2 times per day. You may hold a soup can or a water bottle to simulate a 1lb weight for more strengthening.   1) Shoulder Protraction    Begin with elbows by your side, slowly "punch" straight out in front of you.      2) Shoulder Flexion  Standing:         Begin with arms at your side with thumbs pointed up, slowly raise both arms up and forward towards overhead.               3) Horizontal abduction/adduction  Standing:           Begin with arms straight out in front of you, bring out to the side in at "T" shape. Keep arms straight entire time.                 4) Internal & External Rotation  Standing:     Stand with elbows at the side and elbows bent 90 degrees. Move your forearms away from your body, then bring back inward toward the body.     5) Shoulder Abduction  Standing:       Lying on your back begin with your arms flat on the table next to your side. Slowly move your arms out to the side so that they go overhead, in a jumping jack or snow angel movement.

## 2020-09-21 ENCOUNTER — Other Ambulatory Visit: Payer: Self-pay

## 2020-09-21 ENCOUNTER — Encounter (HOSPITAL_COMMUNITY): Payer: Self-pay | Admitting: Physical Therapy

## 2020-09-21 ENCOUNTER — Encounter (HOSPITAL_COMMUNITY): Payer: Self-pay

## 2020-09-21 ENCOUNTER — Ambulatory Visit (HOSPITAL_COMMUNITY): Payer: Medicare Other | Admitting: Physical Therapy

## 2020-09-21 ENCOUNTER — Ambulatory Visit (HOSPITAL_COMMUNITY): Payer: Medicare Other

## 2020-09-21 DIAGNOSIS — R2689 Other abnormalities of gait and mobility: Secondary | ICD-10-CM

## 2020-09-21 DIAGNOSIS — M6281 Muscle weakness (generalized): Secondary | ICD-10-CM

## 2020-09-21 DIAGNOSIS — R278 Other lack of coordination: Secondary | ICD-10-CM | POA: Diagnosis not present

## 2020-09-21 DIAGNOSIS — R29898 Other symptoms and signs involving the musculoskeletal system: Secondary | ICD-10-CM

## 2020-09-21 NOTE — Therapy (Signed)
[place], [place], [address] Phone: [phone]   Fax:  [phone]  Physical Therapy Treatment  Patient Details  Name: Alison Cuevas MRN: [id number] Date of Birth: 1946-06-25 Referring Provider (PT): Alger Simons MD   Encounter Date: 09/21/2020   PT End of Session - 09/21/20 0906    Visit Number 6    Number of Visits 16    Date for PT Re-Evaluation 10/28/20    Authorization Type Medicare A/ [place] NAP 2ndary    Progress Note Due on Visit 10    PT Start Time 0900    PT Stop Time 0940    PT Time Calculation (min) 40 min    Activity Tolerance Patient tolerated treatment well    Behavior During Therapy [facility] for tasks assessed/performed           Past Medical History:  Diagnosis Date  . Allergic rhinitis    maple pollen  . Anxiety   . Arthritis   . Bowel obstruction ([place])   . Chronic low back pain    Dr. Trenton Gammon.  Has had back injection--bp elevated after.  . Chronic pain syndrome   . Chronic renal insufficiency, stage 2 (mild)    GFR 60-70  . DDD (degenerative disc disease), cervical    Hx of ACDF (Dr. [name]).  Followed by Dr. [name].  Also, Dr. [name] do left C7-T1 intralaminal epidural injection.  . Diverticulosis of colon   . DJD (degenerative joint disease)   . Gallstones   . GERD (gastroesophageal reflux disease)   . Herpes zoster 07/08/2014  . 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  Resid R arm and leg weakness + flexion contractures.  . Hypercholesteremia    mild; pt declined statin trial 05/2014--needs recheck lipid panel at first f/u visit in 2017  . Hypertension   . Osteopenia 06/2017   T score -1.4 FRAX 15% / 2%  . Ovarian cyst 2017   82019-robotic assisted bilatel SPO: all path benign.  . Perianal dermatitis    prn cutivate  . Phlebitis   . Right hemiplegia ([place]) 01/2020   R arm and leg  (hemorrhagic L cerebral hemisphere CVA)  . Right knee injury 2018   Patellofemoral crush injury--Dr. [name].  . Trochanteric bursitis of right hip    Recurrent (injection by Dr. [name] 05/26/15)  . Tympanic membrane rupture, right 12/01/2016   As of 08/2018 pt set for tympanomastoidectomy and STSG (Dr. [name]). Recurrent fungal/bact OE.  . Varicose vein    left leg     Past Surgical History:  Procedure Laterality Date  . ABDOMINAL HYSTERECTOMY  03/2015   TAH.  Last pap 2016.  No hx of abnl paps.  Per GYN, no further pap smears indicated.  . ANTERIOR AND POSTERIOR REPAIR N/A 03/18/2014   Procedure: Cystocele repair with graft, Vault suspension, Rectocele repair;  Surgeon: [name], MD;  [facility];  Service: Urology;  Laterality: N/A;  . CHOLECYSTECTOMY    . CHOLECYSTECTOMY, LAPAROSCOPIC    . COLON SURGERY    . COLONOSCOPY  04/2006; 05/26/16   2007 (Dr. [name]): Normal.  04/2016 (Dr. [name]) normal except diverticulosis and decreased anal sphincter tone.  No repeat colonoscopy is recommended due to age.  . cspine surgery     Dr. Wiliam Ke level ant cerv discectomy /fusion w/plating  . CYSTO N/A 03/18/2014   Procedure: CYSTO;  Surgeon: Elayne Snare  MacDiarmid, MD;  [facility];  Service: Urology;  Laterality: N/A;  . DEXA  07/30/2015; 06/2018   2017 and 2020 -->Osteopenia--repeat 2 yrs.  Marland Kitchen LYSIS OF ADHESION N/A 01/31/2018   Procedure: POSSIBLE LYSIS OF ADHESION;  Surgeon: Everitt Amber, MD;  [facility];  Service: Gynecology;  Laterality: N/A;  . [place]     with prolasped bledder repair  . RESECTION OF COLON     BENIGN TUMOR  . RIGHT EAR SURGERY  08/23/2017   Dr. 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 right ear surgery  12/12/2018   Right tympanomastoidectomy, ossiculoplasty with partial prosthesis, split thickness  skin graft from postauricular skin 1x1cm, and right tragal cartilage graft 1x1 cm [facility])  . right hemicolectomy for diverticulitis with abscess  1993  . ROBOTIC ASSISTED BILATERAL SALPINGO OOPHERECTOMY Bilateral 01/31/2018   All PATH benign.  Procedure: XI ROBOTIC ASSISTED BILATERAL SALPINGO OOPHORECTOMY;  Surgeon: Everitt Amber, MD;  [facility];  Service: Gynecology;  Laterality: Bilateral;  . TRANSTHORACIC ECHOCARDIOGRAM  01/27/2020   EF 65-70%, NORMAL.  . TUBAL LIGATION      There were no vitals filed for this visit.   Subjective Assessment - 09/21/20 0905    Subjective Patient says she is doing well. She is doing the exercise from HEP at home. No issues but her brace is still giving her some trouble. She is having some pain in her RT thigh today.    Pertinent History LT CVA 01/26/20    Currently in Pain? Yes    Pain Score 5     Pain Location Leg    Pain Orientation Right;Anterior    Pain Descriptors / Indicators Sharp;Shooting    Pain Type Acute pain    Pain Onset More than a month ago    Pain Frequency Intermittent                             [facility] Adult PT Treatment/Exercise - 09/21/20 0908      Knee/Hip Exercises: Stretches   Other Knee/Hip Stretches right hip adductor stretch 2 x 60 sec to decrease tone/spasticity      Knee/Hip Exercises: Standing   Knee Flexion Both;2 sets;10 reps    Hip Flexion Both;2 sets;10 reps    Hip Abduction Both;2 sets;10 reps    Forward Step Up Both;1 set;10 reps;Hand Hold: 2;Step Height: 2"    Gait Training 110 feet with RW    Other Standing Knee Exercises 2 inch alternating step taps 2 x 20 with 2HHA, 1x20 with HHA x 1    Other Standing Knee Exercises staggered stance solid floor 2 x 20" each with int HHA      Knee/Hip Exercises: Seated   Long Arc Quad 20 reps    Marching 20 reps                    PT Short Term Goals - 09/09/20 1551      PT SHORT TERM GOAL #1   Title Patient will be independent with  initial HEP and self-management strategies to improve functional outcomes    Time 4    Period Weeks    Status On-going    Target Date 09/30/20      PT SHORT TERM GOAL #2   Title Patient will be able to  ambulate at least 100 feet during 2MWT with LRAD to demonstrate improved ability to perform functional mobility and associated tasks.    Time 4    Period Weeks    Status On-going    Target Date 09/30/20             PT Long Term Goals - 09/09/20 1551      PT LONG TERM GOAL #1   Title Patient will be able to ambulate at least 225 feet during 2MWT with LRAD to demonstrate improved ability to perform functional mobility and associated tasks.    Time 8    Period Weeks    Status On-going      PT LONG TERM GOAL #2   Title Patient will have equal to or > 4/5 MMT throughout RLE to improve ability to perform functional mobility, stair ambulation and ADLs.    Time 8    Period Weeks    Status On-going      PT LONG TERM GOAL #3   Title Patient will be able to maintain semi tandem stance >30 seconds on BLEs to improve stability and reduce risk for falls    Time 8    Period Weeks    Status On-going                 Plan - 09/21/20 0944    Clinical Impression Statement Patient tolerated session well today. She was well challenged with balance progressions. Patient was able to progress standing step taps to single HHA support with no LOB. Patient requires verbal cues for upright posture with standing activity and for LE sequencing with added step ups. Patient is making good progress with ambulation. Patient will continue to benefit from skilled therapy services to reduce limitations and improved functional ability.    Personal Factors and Comorbidities Age;Comorbidity 1    Comorbidities CVA    Examination-Activity Limitations Locomotion Level;Bed Mobility;Stand;Stairs;Transfers    Examination-Participation Restrictions Community Activity;Yard Work;Laundry;Driving;Cleaning     Stability/Clinical Decision Making Stable/Uncomplicated    Rehab Potential Good    PT Frequency 2x / week    PT Duration 8 weeks    PT Treatment/Interventions ADLs/Self Care Home Management;Aquatic Therapy;Iontophoresis 4mg /ml Dexamethasone;DME Instruction;Balance training;Passive range of motion;Scar mobilization;Neuromuscular re-education;Gait training;Stair training;Functional mobility training;Patient/family education;Manual techniques;Dry needling;Energy conservation;Manual lymph drainage;Parrafin;Ultrasound;Therapeutic activities;Canalith Repostioning;Moist Heat;Traction;Biofeedback;Cryotherapy;Fluidtherapy;Electrical Stimulation;Contrast Bath;Therapeutic exercise;Orthotic Fit/Training;Compression bandaging;Splinting;Taping;Vasopneumatic Device;Joint Manipulations;Spinal Manipulations;Visual/perceptual remediation/compensation;Vestibular    PT Next Visit Plan Continue standing LE strength, static balance and gait. Progress gait and balance as able. Add sidestepping next visit    PT Home Exercise Plan Eval: seated march, heel/toe raise, LAQs; 09/17/20: STS, standing abduction and marching wiht UE support counter    Consulted and Agree with Plan of Care Patient           Patient will benefit from skilled therapeutic intervention in order to improve the following deficits and impairments:  Pain,Improper body mechanics,Abnormal gait,Decreased balance,Decreased activity tolerance,Decreased range of motion,Decreased strength,Hypomobility,Decreased mobility,Impaired sensation,Difficulty walking  Visit Diagnosis: Other abnormalities of gait and mobility  Muscle weakness (generalized)     Problem List Patient Active Problem List   Diagnosis Date Noted  . Pain in joint of right foot 04/15/2020  . Right spastic hemiparesis ([place]) 03/04/2020  . Chronic otitis externa of right ear 02/20/2020  . Intraparenchymal hemorrhage of brain ([place])   . Delirium   . ICH (intracerebral hemorrhage) (HCC) - L  frontoparietal, hypertensive 01/26/2020  . Stroke (cerebrum) ([place]) 01/26/2020  . Conductive hearing loss of right ear with unrestricted hearing  of left ear 05/28/2019  . Hyponatremia 08/31/2017  . Generalized weakness 08/31/2017  . Dehydration 08/31/2017  . UTI (urinary tract infection) 08/31/2017  . Tympanic membrane rupture, right 12/01/2016  . Right ovarian cyst 08/22/2016  . Fibromyalgia 06/03/2015  . Cystocele 03/18/2014  . 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 Diverticulosis of colon without hemorrhage 06/12/2013  . Hypertension   . Osteopenia   . Chronic low back pain 05/12/2011  . HYPERCHOLESTEROLEMIA 03/17/2008  . Anxiety state 09/17/2007  . GERD 09/17/2007  . DJD (degenerative joint disease) 09/17/2007    9:47 AM, 09/21/20 Josue Hector PT DPT  Physical Therapist with [facility]  [phone] [place] [address] [place], [place], [address] Phone: [phone]   Fax:  [phone]  Name: Alison Cuevas MRN: [id number] Date of Birth: 09-28-1945

## 2020-09-21 NOTE — Therapy (Signed)
[place], [place], [address] Phone: [phone]   Fax:  [phone]  Occupational Therapy Treatment  Patient Details  Name: Alison Cuevas MRN: [id number] Date of Birth: 23-Jul-1945 Referring Provider (OT): Alger Simons, MD   Encounter Date: 09/21/2020   OT End of Session - 09/21/20 0919    Visit Number 5    Number of Visits 12    Date for OT Re-Evaluation 10/14/20    Authorization Type double coverage Medicare 80/20% secondary: [employer] Covers what medicare doesn't.    Progress Note Due on Visit 10    OT Start Time 0815    OT Stop Time [phone]    OT Time Calculation (min) 38 min    Activity Tolerance Patient tolerated treatment well    Behavior During Therapy [facility] for tasks assessed/performed           Past Medical History:  Diagnosis Date  . Allergic rhinitis    maple pollen  . Anxiety   . Arthritis   . Bowel obstruction ([place])   . Chronic low back pain    Dr. Trenton Gammon.  Has had back injection--bp elevated after.  . Chronic pain syndrome   . Chronic renal insufficiency, stage 2 (mild)    GFR 60-70  . DDD (degenerative disc disease), cervical    Hx of ACDF (Dr. [name]).  Followed by Dr. [name].  Also, Dr. [name] do left C7-T1 intralaminal epidural injection.  . Diverticulosis of colon   . DJD (degenerative joint disease)   . Gallstones   . GERD (gastroesophageal reflux disease)   . Herpes zoster 07/08/2014  . 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  Resid R arm and leg weakness + flexion contractures.  . Hypercholesteremia    mild; pt declined statin trial 05/2014--needs recheck lipid panel at first f/u visit in 2017  . Hypertension   . Osteopenia 06/2017   T score -1.4 FRAX 15% / 2%  . Ovarian cyst 2017   82019-robotic assisted bilatel SPO: all path benign.  . Perianal dermatitis    prn cutivate  .  Phlebitis   . Right hemiplegia ([place]) 01/2020   R arm and leg (hemorrhagic L cerebral hemisphere CVA)  . Right knee injury 2018   Patellofemoral crush injury--Dr. [name].  . Trochanteric bursitis of right hip    Recurrent (injection by Dr. [name] 05/26/15)  . Tympanic membrane rupture, right 12/01/2016   As of 08/2018 pt set for tympanomastoidectomy and STSG (Dr. [name]). Recurrent fungal/bact OE.  . Varicose vein    left leg     Past Surgical History:  Procedure Laterality Date  . ABDOMINAL HYSTERECTOMY  03/2015   TAH.  Last pap 2016.  No hx of abnl paps.  Per GYN, no further pap smears indicated.  . ANTERIOR AND POSTERIOR REPAIR N/A 03/18/2014   Procedure: Cystocele repair with graft, Vault suspension, Rectocele repair;  Surgeon: [name], MD;  Location: [facility];  Service: Urology;  Laterality: N/A;  . CHOLECYSTECTOMY    . CHOLECYSTECTOMY, LAPAROSCOPIC    . COLON SURGERY    . COLONOSCOPY  04/2006; 05/26/16   2007 (Dr. [name]): Normal.  04/2016 (Dr. [name]) normal except diverticulosis and decreased anal sphincter tone.  No repeat colonoscopy is recommended due to age.  . cspine surgery     Dr. Wiliam Ke level ant cerv discectomy /fusion w/plating  . CYSTO N/A 03/18/2014   Procedure:  CYSTO;  Surgeon: [name], MD;  Location: [facility];  Service: Urology;  Laterality: N/A;  . DEXA  07/30/2015; 06/2018   2017 and 2020 -->Osteopenia--repeat 2 yrs.  Marland Kitchen LYSIS OF ADHESION N/A 01/31/2018   Procedure: POSSIBLE LYSIS OF ADHESION;  Surgeon: Everitt Amber, MD;  Location: [facility];  Service: Gynecology;  Laterality: N/A;  . [place]     with prolasped bledder repair  . RESECTION OF COLON     BENIGN TUMOR  . RIGHT EAR SURGERY  08/23/2017   Dr. 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 right ear surgery  12/12/2018   Right  tympanomastoidectomy, ossiculoplasty with partial prosthesis, split thickness skin graft from postauricular skin 1x1cm, and right tragal cartilage graft 1x1 cm [facility])  . right hemicolectomy for diverticulitis with abscess  1993  . ROBOTIC ASSISTED BILATERAL SALPINGO OOPHERECTOMY Bilateral 01/31/2018   All PATH benign.  Procedure: XI ROBOTIC ASSISTED BILATERAL SALPINGO OOPHORECTOMY;  Surgeon: Everitt Amber, MD;  Location: [facility];  Service: Gynecology;  Laterality: Bilateral;  . TRANSTHORACIC ECHOCARDIOGRAM  01/27/2020   EF 65-70%, NORMAL.  . TUBAL LIGATION      There were no vitals filed for this visit.   Subjective Assessment - 09/21/20 0839    Subjective  S: I didn't get to do my exercises as much as I should because my husband is in the hospital.    Currently in Pain? Yes    Pain Score 5     Pain Location --   elbow and hip   Pain Orientation Right    Pain Descriptors / Indicators Sore;Aching    Pain Type Acute pain    Pain Onset In the past 7 days    Pain Frequency Constant    Aggravating Factors  from exercises    Pain Relieving Factors pain medication, rest    Effect of Pain on Daily Activities mod effect    Multiple Pain Sites No              [facility] OT Assessment - 09/21/20 0821      Assessment   Medical Diagnosis Right side weakness S/P CVA      Precautions   Precautions Fall                    OT Treatments/Exercises (OP) - 09/21/20 0820      Exercises   Exercises Shoulder;Elbow;Theraputty;Hand      Shoulder Exercises: Seated   Protraction Strengthening;12 reps    Protraction Weight (lbs) 1    Horizontal ABduction Strengthening;12 reps    Horizontal ABduction Weight (lbs) 1    External Rotation Strengthening;12 reps    External Rotation Weight (lbs) 2    Internal Rotation Strengthening;12 reps    Internal Rotation Weight (lbs) 2    Flexion Strengthening;12 reps    Flexion Weight (lbs) 1    Abduction Strengthening;12 reps    ABduction Weight (lbs)  1      Additional Elbow Exercises   Hand Gripper with Large Beads all beads gripper at 29#, vertical    Hand Gripper with Medium Beads all beads gripper at 29#, horizontal      Fine Motor Coordination (Hand/Wrist)   Fine Motor Coordination Dealing card with thumb;In hand manipuation training    In Scientist, clinical (histocompatibility and immunogenetics) Using pennies, patient completed in hand manipulation task. Picked one coin  up to transfer from fingertip to palm for a total of 5. Transfered to fingertip to stack.    Dealing card with thumb Hold deck of cards in right hand, patient used her thumb to deal cards one at a time.                    OT Short Term Goals - 09/11/20 0941      OT SHORT TERM GOAL #1   Title Pt will be educated and independent with HEP in order to faciliate progress in therapy and work towards increasing her RUE strength and coordination while increasing her functional performance during ADL tasks.    Time 3    Period Weeks    Status On-going    Target Date 09/23/20             OT Long Term Goals - 09/11/20 0941      OT LONG TERM GOAL #1   Title Patient will increase right shoulder strength and elbow to 5/5 while demonstrating increased shoulder and scapular stability when reaching away from her body/base of support.    Time 6    Period Weeks    Status On-going      OT LONG TERM GOAL #2   Title Patient will increase right hand coordination by completing the 9 hole peg test in 30 seconds or less in order to roll hair with less difficulty.    Time 6    Period Weeks    Status On-going      OT LONG TERM GOAL #3   Title Patient will increase right hand grip strength by 10# and pinch strength by 5# in order to increase ability to grasp and manipulate items with less difficulty.    Time 6    Period Weeks    Status On-going      OT LONG TERM GOAL #4   Title patient will reach highest level of independence with increased confidence and safety awareness while completing all  ADL tasks at modified independent level or better while utilizing adaptive equipment and/or compensatory strategies if needed.    Baseline 3/9: Pt is sponge bathing and does not take a shower unless someone is present for supervision.    Time 6    Period Weeks    Status On-going                 Plan - 09/21/20 0919    Clinical Impression Statement A: Focused on in hand manipulation task using pennies. Patient completed with moderate difficulty when attempting to stack a tower of 5. When 1lb wrist weight was placed on wrist, she demonstrated greater shoulder stability and less apraxic movement during task. VC for form and technique were provided. Patient completed UB strengthening this session with great form  while increasing repetitions to 12.    Body Structure / Function / Physical Skills ADL;UE functional use;FMC;ROM;Proprioception;Coordination;GMC;Strength    Plan P: Attempt small beads with hand gripper. Use squigz for functional reaching (possibly standing.) Complete reaching tasks out of her base of support to focus on ADL task independence.    Consulted and Agree with Plan of Care Patient           Patient will benefit from skilled therapeutic intervention in order to improve the following deficits and impairments:   Body Structure / Function / Physical Skills: ADL,UE functional use,FMC,ROM,Proprioception,Coordination,GMC,Strength       Visit Diagnosis: Other symptoms and signs involving the musculoskeletal system  Other  lack of coordination    Problem List Patient Active Problem List   Diagnosis Date Noted  . Pain in joint of right foot 04/15/2020  . Right spastic hemiparesis ([place]) 03/04/2020  . Chronic otitis externa of right ear 02/20/2020  . Intraparenchymal hemorrhage of brain ([place])   . Delirium   . 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 Stroke (cerebrum) ([place]) 01/26/2020  . Conductive hearing loss of right ear  with unrestricted hearing of left ear 05/28/2019  . Hyponatremia 08/31/2017  . Generalized weakness 08/31/2017  . Dehydration 08/31/2017  . UTI (urinary tract infection) 08/31/2017  . Tympanic membrane rupture, right 12/01/2016  . Right ovarian cyst 08/22/2016  . Fibromyalgia 06/03/2015  . Cystocele 03/18/2014  . 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 Diverticulosis of colon without hemorrhage 06/12/2013  . Hypertension   . Osteopenia   . Chronic low back pain 05/12/2011  . HYPERCHOLESTEROLEMIA 03/17/2008  . Anxiety state 09/17/2007  . GERD 09/17/2007  . DJD (degenerative joint disease) 09/17/2007    Ailene Ravel, OTR/L,CBIS  [phone]  09/21/2020, 9:23 AM  [place] [address] [place], [place], [address] Phone: [phone]   Fax:  [phone]  Name: Aicia Babinski MRN: [id number] Date of Birth: May 04, 1946

## 2020-09-22 ENCOUNTER — Encounter: Payer: Self-pay | Admitting: Family Medicine

## 2020-09-22 ENCOUNTER — Ambulatory Visit (INDEPENDENT_AMBULATORY_CARE_PROVIDER_SITE_OTHER): Payer: Medicare Other | Admitting: Family Medicine

## 2020-09-22 VITALS — BP 113/61 | HR 76 | Temp 97.7°F | Resp 16 | Ht 60.0 in | Wt 117.8 lb

## 2020-09-22 DIAGNOSIS — G894 Chronic pain syndrome: Secondary | ICD-10-CM | POA: Diagnosis not present

## 2020-09-22 DIAGNOSIS — I10 Essential (primary) hypertension: Secondary | ICD-10-CM

## 2020-09-22 DIAGNOSIS — F411 Generalized anxiety disorder: Secondary | ICD-10-CM

## 2020-09-22 DIAGNOSIS — I6930 Unspecified sequelae of cerebral infarction: Secondary | ICD-10-CM | POA: Diagnosis not present

## 2020-09-22 LAB — BASIC METABOLIC PANEL
BUN: 12 mg/dL (ref 6–23)
CO2: 28 mEq/L (ref 19–32)
Calcium: 9.6 mg/dL (ref 8.4–10.5)
Chloride: 98 mEq/L (ref 96–112)
Creatinine, Ser: 0.77 mg/dL (ref 0.40–1.20)
GFR: 75.16 mL/min (ref >60.00)
Glucose, Bld: 96 mg/dL (ref 70–99)
Potassium: 4.5 mEq/L (ref 3.5–5.1)
Sodium: 133 mEq/L — ABNORMAL LOW (ref 135–145)

## 2020-09-22 NOTE — Progress Notes (Signed)
OFFICE VISIT  09/22/2020  CC:  Chief Complaint  Patient presents with  . Follow-up    Not fasting   HPI:    Patient is a 75 y.o. Caucasian female who presents for f/u chronic illness. A/P as of last visit: "1) Hx of hemorrhagic/hypertensive CVA, minimal residual RU and RL ext weakness, with some contractures on R.  Stable. Cont PT and OT and f/u with Dr. Angelique Holm for botox inj for contractures. She'll try a 1/2 dose of her tizanadine to see if helps but not excessively sedating in daytime. BP control is good with lisinopril 5mg  qd and calan sr 180 mg qd. CBC and BMET today.  2) Recurrent yeast vaginitis + recurrent UTI's: Her recent vag itching + urinary urgency and frequency seem to have responded well to 1 dose of diflucan 150mg . Would like to get UA to make sure no sign of UTI but she could not produce a sample today.  She'll try to collect one tomorrow and son will bring it up here to do ua and c/s on it. I rx'd more diflucan for her to have for prn use at home.  3) HA, tension-type, intractable. Encouraged her to take a whole vicodin tab for the HA: no sign of any inc ICP."  INTERIM HX: Doing fairly well. Ambulates with a walker in home, otherwise uses self-propelled wc.  Hx of hemorrhagic cva:  Getting PT, feels like she's making improvements. Has neuro f/u next week. Pt afraid to take meloxicam b/c says neuro told her that it can cause strokes. This med had been significantly helping with chronic neck, arm, and leg pain.  HTN: no home bp monitoring.  Chronic pain: Indication for chronic opioid:chronic low back and neck pain from DDD, not a candidate for further surgical intervention, failed ESI.  Also, R lower leg pain and R arm pain d/t flexion contrature issues since CVA 01/2020. Medication and dose:vicodin 5/325, 1 tid prn--->takes 1/2-1 tab prn and it helps well.  PMP AWARE reviewed today: most recent rx for vicodin 5/325 was filled 09/15/20, # 15, rx by me. Most  recent alpraz 0.5mg  rx filled 08/31/20, #90, rx by me. No red flags.  Past Medical History:  Diagnosis Date  . Allergic rhinitis    maple pollen  . Anxiety   . Arthritis   . Bowel obstruction ([place])   . Chronic low back pain    Dr. Trenton Gammon.  Has had back injection--bp elevated after.  . Chronic pain syndrome   . Chronic renal insufficiency, stage 2 (mild)    GFR 60-70  . DDD (degenerative disc disease), cervical    Hx of ACDF (Dr. [name]).  Followed by Dr. [name].  Also, Dr. [name] do left C7-T1 intralaminal epidural injection.  . Diverticulosis of colon   . DJD (degenerative joint disease)   . Gallstones   . GERD (gastroesophageal reflux disease)   . Herpes zoster 07/08/2014  . 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  Resid R arm and leg weakness + flexion contractures.  . Hypercholesteremia    mild; pt declined statin trial 05/2014--needs recheck lipid panel at first f/u visit in 2017  . Hypertension   . Osteopenia 06/2017   T score -1.4 FRAX 15% / 2%  . Ovarian cyst 2017   82019-robotic assisted bilatel SPO: all path benign.  . Perianal dermatitis    prn cutivate  . Phlebitis   . Right hemiplegia ([place])  01/2020   R arm and leg (hemorrhagic L cerebral hemisphere CVA)  . Right knee injury 2018   Patellofemoral crush injury--Dr. [name].  . Trochanteric bursitis of right hip    Recurrent (injection by Dr. [name] 05/26/15)  . Tympanic membrane rupture, right 12/01/2016   As of 08/2018 pt set for tympanomastoidectomy and STSG (Dr. [name]). Recurrent fungal/bact OE.  . Varicose vein    left leg     Past Surgical History:  Procedure Laterality Date  . ABDOMINAL HYSTERECTOMY  03/2015   TAH.  Last pap 2016.  No hx of abnl paps.  Per GYN, no further pap smears indicated.  . ANTERIOR AND POSTERIOR REPAIR N/A 03/18/2014   Procedure: Cystocele repair with graft, Vault suspension,  Rectocele repair;  Surgeon: [name], MD;  [facility]: [facility];  Service: Urology;  Laterality: N/A;  . CHOLECYSTECTOMY    . CHOLECYSTECTOMY, LAPAROSCOPIC    . COLON SURGERY    . COLONOSCOPY  04/2006; 05/26/16   2007 (Dr. [name]): Normal.  04/2016 (Dr. [name]) normal except diverticulosis and decreased anal sphincter tone.  No repeat colonoscopy is recommended due to age.  . cspine surgery     Dr. Wiliam Ke level ant cerv discectomy /fusion w/plating  . CYSTO N/A 03/18/2014   Procedure: CYSTO;  Surgeon: [name], MD;  Location: [facility];  Service: Urology;  Laterality: N/A;  . DEXA  07/30/2015; 06/2018   2017 and 2020 -->Osteopenia--repeat 2 yrs.  Marland Kitchen LYSIS OF ADHESION N/A 01/31/2018   Procedure: POSSIBLE LYSIS OF ADHESION;  Surgeon: Everitt Amber, MD;  Location: [facility];  Service: Gynecology;  Laterality: N/A;  . [place]     with prolasped bledder repair  . RESECTION OF COLON     BENIGN TUMOR  . RIGHT EAR SURGERY  08/23/2017   Dr. 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 right ear surgery  12/12/2018   Right tympanomastoidectomy, ossiculoplasty with partial prosthesis, split thickness skin graft from postauricular skin 1x1cm, and right tragal cartilage graft 1x1 cm [facility])  . right hemicolectomy for diverticulitis with abscess  1993  . ROBOTIC ASSISTED BILATERAL SALPINGO OOPHERECTOMY Bilateral 01/31/2018   All PATH benign.  Procedure: XI ROBOTIC ASSISTED BILATERAL SALPINGO OOPHORECTOMY;  Surgeon: Everitt Amber, MD;  Location: [facility];  Service: Gynecology;  Laterality: Bilateral;  . TRANSTHORACIC ECHOCARDIOGRAM  01/27/2020   EF 65-70%, NORMAL.  . TUBAL LIGATION      Outpatient Medications Prior to Visit  Medication Sig Dispense Refill  . acetaminophen (TYLENOL) 500 MG tablet Take 1 tablet (500 mg total) by mouth 3 (three) times daily.  30 tablet 0  . ALPRAZolam (XANAX) 0.5 MG tablet TAKE 1 TABLET BY MOUTH THREE TIMES A DAY AS NEEDED FOR ANXIETY 90 tablet 5  . aspirin EC 81 MG tablet Take 1 tablet (81 mg total) by mouth daily. Swallow whole. 30 tablet 11  . Biotin 5 MG TABS Take 5 mg by mouth daily.    . fluticasone (FLONASE) 50 MCG/ACT nasal spray Place 2 sprays into both nostrils at bedtime. 48 g 3  . HYDROcodone-acetaminophen (NORCO/VICODIN) 5-325 MG tablet 1/2-1 tab po q6h prn pain 15 tablet 0  . lisinopril (ZESTRIL) 5 MG tablet Take 1 tablet (5 mg total) by mouth daily. 90 tablet 3  . loratadine (CLARITIN) 10 MG tablet Take 10 mg by mouth every other day.     Marland Kitchen  meclizine (ANTIVERT) 12.5 MG tablet Take 1 tablet (12.5 mg total) by mouth 3 (three) times daily. (Patient taking differently: Take 12.5 mg by mouth. Takes at night) 30 tablet 0  . Multiple Minerals-Vitamins (CALCIUM-MAGNESIUM-ZINC-D3 PO) Take 1 tablet by mouth daily.     . pantoprazole (PROTONIX) 40 MG tablet Take 1 tablet (40 mg total) by mouth at bedtime. 90 tablet 3  . polyethylene glycol (MIRALAX / GLYCOLAX) 17 g packet Take 17 g by mouth daily as needed.    . potassium chloride SA (KLOR-CON) 20 MEQ tablet Take 1 tablet (20 mEq total) by mouth daily. 90 tablet 3  . Sodium Fluoride (CLINPRO 5000) 1.1 % PSTE Clinpro 5000 1.1 % dental paste    . tiZANidine (ZANAFLEX) 2 MG tablet 1 tab po tid prn for muscle relaxation 90 tablet 3  . verapamil (CALAN-SR) 180 MG CR tablet Take 1 tablet (180 mg total) by mouth daily. 90 tablet 3  . Carboxymethylcellul-Glycerin (LUBRICATING EYE DROPS OP) Place 1 drop into both eyes daily as needed (dry eyes). (Patient not taking: Reported on 09/22/2020)    . ciprofloxacin-dexamethasone (CIPRODEX) OTIC suspension ciprofloxacin 0.3 %-dexamethasone 0.1 % ear drops,suspension (Patient not taking: No sig reported)    . triamcinolone ointment (KENALOG) 0.1 % triamcinolone acetonide 0.1 % topical ointment (Patient not taking: No sig reported)     . lidocaine (LIDODERM) 5 % Place 1 patch onto the skin daily as needed. Remove & Discard patch within 12 hours or as directed by MD (Patient not taking: Reported on 09/22/2020) 30 patch 0  . meloxicam (MOBIC) 15 MG tablet Take 1 tablet (15 mg total) by mouth daily as needed for pain. (Patient not taking: No sig reported) 90 tablet 1   No facility-administered medications prior to visit.    Allergies  Allergen Reactions  . Gabapentin Anxiety    Elevated heart rate and crazy dreams  . Prednisone Anxiety and Other (See Comments)    REACTION: funny feeling    ROS As per HPI  PE: Vitals with BMI 09/22/2020 08/12/2020 06/26/2020  Height 5\' 0"  5\' 0"  -  Weight 117 lbs 13 oz 126 lbs -  BMI 78.58 85.02 -  Systolic 774 128 786  Diastolic 61 75 80  Pulse 76 83 95   Gen: Alert, tired- appearing but not acutely ill, sitting up in WC.  Patient is oriented to person, place, time, and situation. VEH:MCNO: no injection, icteris, swelling, or exudate.  EOMI, PERRLA. Mouth: lips without lesion/swelling.  Oral mucosa pink and moist. Oropharynx without erythema, exudate, or swelling.  CV: RRR, no m/r/g.   LUNGS: CTA bilat, nonlabored resps, good aeration in all lung fields. EXT: no clubbing or cyanosis.  no edema.  Neuro: CN 2-12 intact bilaterally, strength 5/5 in proximal and distal upper extremities and lower extremities bilaterally.  No tremor.  Mild R hand pronator drift. R LL in AFO so I could not test strength in R LL/ankle/foot.     LABS:  Lab Results  Component Value Date   TSH 1.08 09/25/2019   Lab Results  Component Value Date   WBC 9.0 06/25/2020   HGB 13.4 06/25/2020   HCT 40.0 06/25/2020   MCV 92.0 06/25/2020   PLT 217 06/25/2020   Lab Results  Component Value Date   CREATININE 0.88 06/25/2020   BUN 16 06/25/2020   NA 136 06/25/2020   K 4.3 06/25/2020   CL 102 06/25/2020   CO2 24 06/25/2020   Lab Results  Component Value  Date   ALT 29 06/25/2020   AST 30  06/25/2020   ALKPHOS 95 06/25/2020   BILITOT 1.0 06/25/2020   Lab Results  Component Value Date   CHOL 163 01/28/2020   Lab Results  Component Value Date   HDL 75 01/28/2020   Lab Results  Component Value Date   LDLCALC 81 01/28/2020   Lab Results  Component Value Date   TRIG 33 01/28/2020   Lab Results  Component Value Date   CHOLHDL 2.2 01/28/2020   Lab Results  Component Value Date   HGBA1C 5.0 01/28/2020   IMPRESSION AND PLAN:  1) Hx of L hemispheric hemorrhagic cva, with residual R leg >R arm weakness and flexion contracture.   Cont bp control, PT.  2) HTN: well controlled on calan sr 180mg  qd and lisinopril 5mg  qd. Lytes/cr monitoring today.  3) Chronic pain: osteoarthritis/DDD neck, also musculoskeletal pain d/t contracture in R leg and R arm.  Meloxicam had been quite helpful but she is scared off of this med now b/c she states her neurologist told her it causes strokes.   We discussed the fact that it may be assoc with bp rise in some people and this could inc risk of cva, but the med itself is not a cause of stroke.  She will have to reach a comfort level (perhaps after further clarification conversations with her neurologist) before she will consider retry of this med. For now, she essentially deals with pain stoically, resorts to use of vicodin in small doses only occasionally.  4) GAD: stable long term on alpraz 0.5mg  tid.  5) Preventative health care: no further cervical or colon ca screening is indicated. Last screening mammogram was 07/11/19--normal.  Needs repeat at her convenience. Vaccines are ALL UTD.  An After Visit Summary was printed and given to the patient.  FOLLOW UP: Return in about 3 months (around 12/23/2020) for routine chronic illness f/u.  Signed:  Crissie Sickles, MD           09/22/2020

## 2020-09-23 ENCOUNTER — Ambulatory Visit (HOSPITAL_COMMUNITY): Payer: Medicare Other

## 2020-09-23 ENCOUNTER — Ambulatory Visit (HOSPITAL_COMMUNITY): Payer: Medicare Other | Admitting: Physical Therapy

## 2020-09-23 ENCOUNTER — Encounter (HOSPITAL_COMMUNITY): Payer: Self-pay | Admitting: Physical Therapy

## 2020-09-23 ENCOUNTER — Other Ambulatory Visit: Payer: Self-pay

## 2020-09-23 DIAGNOSIS — R278 Other lack of coordination: Secondary | ICD-10-CM

## 2020-09-23 DIAGNOSIS — R29898 Other symptoms and signs involving the musculoskeletal system: Secondary | ICD-10-CM

## 2020-09-23 DIAGNOSIS — M6281 Muscle weakness (generalized): Secondary | ICD-10-CM

## 2020-09-23 NOTE — Therapy (Signed)
[place], [place], [address] Phone: [phone]   Fax:  [phone]  Physical Therapy Treatment  Patient Details  Name: Alison Cuevas MRN: [id number] Date of Birth: 12-20-1945 Referring Provider (PT): Alger Simons MD   Encounter Date: 09/23/2020   PT End of Session - 09/23/20 1629    Visit Number 7    Number of Visits 16    Date for PT Re-Evaluation 10/28/20    Authorization Type Medicare A/ [place] NAP 2ndary    Progress Note Due on Visit 10    PT Start Time 1631    PT Stop Time 1712    PT Time Calculation (min) 41 min    Activity Tolerance Patient tolerated treatment well    Behavior During Therapy [facility] for tasks assessed/performed           Past Medical History:  Diagnosis Date  . Allergic rhinitis    maple pollen  . Anxiety   . Arthritis   . Bowel obstruction ([place])   . Chronic low back pain    Dr. Trenton Gammon.  Has had back injection--bp elevated after.  . Chronic pain syndrome   . Chronic renal insufficiency, stage 2 (mild)    GFR 60-70  . DDD (degenerative disc disease), cervical    Hx of ACDF (Dr. [name]).  Followed by Dr. [name].  Also, Dr. [name] do left C7-T1 intralaminal epidural injection.  . Diverticulosis of colon   . DJD (degenerative joint disease)   . Gallstones   . GERD (gastroesophageal reflux disease)   . Herpes zoster 07/08/2014  . 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  Resid R arm and leg weakness + flexion contractures.  . Hypercholesteremia    mild; pt declined statin trial 05/2014--needs recheck lipid panel at first f/u visit in 2017  . Hypertension   . Osteopenia 06/2017   T score -1.4 FRAX 15% / 2%  . Ovarian cyst 2017   82019-robotic assisted bilatel SPO: all path benign.  . Perianal dermatitis    prn cutivate  . Phlebitis   . Right hemiplegia ([place]) 01/2020   R arm and leg  (hemorrhagic L cerebral hemisphere CVA)  . Right knee injury 2018   Patellofemoral crush injury--Dr. [name].  . Trochanteric bursitis of right hip    Recurrent (injection by Dr. [name] 05/26/15)  . Tympanic membrane rupture, right 12/01/2016   As of 08/2018 pt set for tympanomastoidectomy and STSG (Dr. [name]). Recurrent fungal/bact OE.  . Varicose vein    left leg     Past Surgical History:  Procedure Laterality Date  . ABDOMINAL HYSTERECTOMY  03/2015   TAH.  Last pap 2016.  No hx of abnl paps.  Per GYN, no further pap smears indicated.  . ANTERIOR AND POSTERIOR REPAIR N/A 03/18/2014   Procedure: Cystocele repair with graft, Vault suspension, Rectocele repair;  Surgeon: [name], MD;  Location: [facility];  Service: Urology;  Laterality: N/A;  . CHOLECYSTECTOMY    . CHOLECYSTECTOMY, LAPAROSCOPIC    . COLON SURGERY    . COLONOSCOPY  04/2006; 05/26/16   2007 (Dr. [name]): Normal.  04/2016 (Dr. [name]) normal except diverticulosis and decreased anal sphincter tone.  No repeat colonoscopy is recommended due to age.  . cspine surgery     Dr. Wiliam Ke level ant cerv discectomy /fusion w/plating  . CYSTO N/A 03/18/2014   Procedure: CYSTO;  Surgeon: Elayne Snare  MacDiarmid, MD;  Location: [facility];  Service: Urology;  Laterality: N/A;  . DEXA  07/30/2015; 06/2018   2017 and 2020 -->Osteopenia--repeat 2 yrs.  Marland Kitchen LYSIS OF ADHESION N/A 01/31/2018   Procedure: POSSIBLE LYSIS OF ADHESION;  Surgeon: Everitt Amber, MD;  Location: [facility];  Service: Gynecology;  Laterality: N/A;  . [place]     with prolasped bledder repair  . RESECTION OF COLON     BENIGN TUMOR  . RIGHT EAR SURGERY  08/23/2017   Dr. 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 right ear surgery  12/12/2018   Right tympanomastoidectomy, ossiculoplasty with partial prosthesis, split thickness  skin graft from postauricular skin 1x1cm, and right tragal cartilage graft 1x1 cm [facility])  . right hemicolectomy for diverticulitis with abscess  1993  . ROBOTIC ASSISTED BILATERAL SALPINGO OOPHERECTOMY Bilateral 01/31/2018   All PATH benign.  Procedure: XI ROBOTIC ASSISTED BILATERAL SALPINGO OOPHORECTOMY;  Surgeon: Everitt Amber, MD;  [facility];  Service: Gynecology;  Laterality: Bilateral;  . TRANSTHORACIC ECHOCARDIOGRAM  01/27/2020   EF 65-70%, NORMAL.  . TUBAL LIGATION      There were no vitals filed for this visit.   Subjective Assessment - 09/23/20 1636    Subjective Patient says she is doing ok today. She has been doing more walking. Her RT leg was spasming some last night.    Pertinent History LT CVA 01/26/20    Limitations Standing;Walking;House hold activities    Currently in Pain? Yes    Pain Score 5     Pain Location Foot    Pain Orientation Right    Pain Descriptors / Indicators Aching;Burning    Pain Type Acute pain    Pain Onset More than a month ago    Pain Frequency Intermittent                             [facility] Adult PT Treatment/Exercise - 09/23/20 0001      Knee/Hip Exercises: Standing   Knee Flexion Both;2 sets;10 reps    Hip Abduction Both;2 sets;10 reps    Forward Step Up Both;1 set;10 reps;Hand Hold: 2;Step Height: 4"    Gait Training 200 feet with hemi walker    Other Standing Knee Exercises 4 inch alternating step taps x 20 2 HHA, x20 1 HHA    Other Standing Knee Exercises staggered stance solid floor 2 x 30" each with int HHA                    PT Short Term Goals - 09/09/20 1551      PT SHORT TERM GOAL #1   Title Patient will be independent with initial HEP and self-management strategies to improve functional outcomes    Time 4    Period Weeks    Status On-going    Target Date 09/30/20      PT SHORT TERM GOAL #2   Title Patient will be able to ambulate at least 100 feet during 2MWT with LRAD to demonstrate  improved ability to perform functional mobility and associated tasks.    Time 4    Period Weeks    Status On-going    Target Date 09/30/20             PT Long Term Goals - 09/09/20 1551  PT LONG TERM GOAL #1   Title Patient will be able to ambulate at least 225 feet during 2MWT with LRAD to demonstrate improved ability to perform functional mobility and associated tasks.    Time 8    Period Weeks    Status On-going      PT LONG TERM GOAL #2   Title Patient will have equal to or > 4/5 MMT throughout RLE to improve ability to perform functional mobility, stair ambulation and ADLs.    Time 8    Period Weeks    Status On-going      PT LONG TERM GOAL #3   Title Patient will be able to maintain semi tandem stance >30 seconds on BLEs to improve stability and reduce risk for falls    Time 8    Period Weeks    Status On-going                 Plan - 09/23/20 1725    Clinical Impression Statement Patient progressing well toward therapy goals Patient shows improving static balance with staggered stance. Patient requires verbal cues for hand placement with sit to stand, but shows improvement in transfer ability. Educated patient on benefits of hemi walker per her improved balance demoed today. Performed gait training with hemi walker, patient did very well with this showing improve ambulatory distance and gait speed. Patient cued on proper sequencing. Patient will continue to benefit from skilled therapy services to reduce limitation and improve functional ability.    Personal Factors and Comorbidities Age;Comorbidity 1    Comorbidities CVA    Examination-Activity Limitations Locomotion Level;Bed Mobility;Stand;Stairs;Transfers    Examination-Participation Restrictions Community Activity;Yard Work;Laundry;Driving;Cleaning    Stability/Clinical Decision Making Stable/Uncomplicated    Rehab Potential Good    PT Frequency 2x / week    PT Duration 8 weeks    PT  Treatment/Interventions ADLs/Self Care Home Management;Aquatic Therapy;Iontophoresis 4mg /ml Dexamethasone;DME Instruction;Balance training;Passive range of motion;Scar mobilization;Neuromuscular re-education;Gait training;Stair training;Functional mobility training;Patient/family education;Manual techniques;Dry needling;Energy conservation;Manual lymph drainage;Parrafin;Ultrasound;Therapeutic activities;Canalith Repostioning;Moist Heat;Traction;Biofeedback;Cryotherapy;Fluidtherapy;Electrical Stimulation;Contrast Bath;Therapeutic exercise;Orthotic Fit/Training;Compression bandaging;Splinting;Taping;Vasopneumatic Device;Joint Manipulations;Spinal Manipulations;Visual/perceptual remediation/compensation;Vestibular    PT Next Visit Plan Continue standing LE strength, static balance and gait. Progress gait and balance as able. Add sidestepping next visit    PT Home Exercise Plan Eval: seated march, heel/toe raise, LAQs; 09/17/20: STS, standing abduction and marching wiht UE support counter 3/30 staggered stance with support    Consulted and Agree with Plan of Care Patient           Patient will benefit from skilled therapeutic intervention in order to improve the following deficits and impairments:  Pain,Improper body mechanics,Abnormal gait,Decreased balance,Decreased activity tolerance,Decreased range of motion,Decreased strength,Hypomobility,Decreased mobility,Impaired sensation,Difficulty walking  Visit Diagnosis: Other symptoms and signs involving the musculoskeletal system  Muscle weakness (generalized)     Problem List Patient Active Problem List   Diagnosis Date Noted  . Pain in joint of right foot 04/15/2020  . Right spastic hemiparesis ([place]) 03/04/2020  . Chronic otitis externa of right ear 02/20/2020  . Intraparenchymal hemorrhage of brain ([place])   . Delirium   . 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 Stroke (cerebrum) ([place]) 01/26/2020  .  Conductive hearing loss of right ear with unrestricted hearing of left ear 05/28/2019  . Hyponatremia 08/31/2017  . Generalized weakness 08/31/2017  . Dehydration 08/31/2017  . UTI (urinary tract infection) 08/31/2017  . Tympanic membrane rupture, right 12/01/2016  . Right ovarian cyst 08/22/2016  . Fibromyalgia 06/03/2015  .  Cystocele 03/18/2014  . 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 Diverticulosis of colon without hemorrhage 06/12/2013  . Hypertension   . Osteopenia   . Chronic low back pain 05/12/2011  . HYPERCHOLESTEROLEMIA 03/17/2008  . Anxiety state 09/17/2007  . GERD 09/17/2007  . DJD (degenerative joint disease) 09/17/2007    5:29 PM, 09/23/20 Josue Hector PT DPT  Physical Therapist with [facility]  [phone] [place] [address] [place], [place], [address] Phone: [phone]   Fax:  [phone]  Name: Alison Cuevas MRN: [id number] Date of Birth: 09-21-1945

## 2020-09-23 NOTE — Patient Instructions (Signed)
Access Code: [email or web address] URL: [email or web address] Date: 09/23/2020 Prepared by: Josue Hector  Exercises Standing Tandem Balance with Counter Support - 2-3 x daily - 7 x weekly - 1 sets - 3 reps - 20 seconds hold

## 2020-09-24 ENCOUNTER — Encounter (HOSPITAL_COMMUNITY): Payer: Self-pay

## 2020-09-24 NOTE — Therapy (Signed)
[place], [place], [address] Phone: [phone]   Fax:  [phone]  Occupational Therapy Treatment  Patient Details  Name: Alison Cuevas MRN: [id number] Date of Birth: [birth year] Referring Provider (OT): Alger Simons, MD   Encounter Date: 09/23/2020   OT End of Session - 09/24/20 0845    Visit Number 6    Number of Visits 12    Date for OT Re-Evaluation 10/14/20    Authorization Type double coverage Medicare 80/20% secondary: [employer] Covers what medicare doesn't.    Progress Note Due on Visit 10    OT Start Time 1736    OT Stop Time 1808    OT Time Calculation (min) 32 min    Activity Tolerance Patient tolerated treatment well    Behavior During Therapy WFL for tasks assessed/performed           Past Medical History:  Diagnosis Date  . Allergic rhinitis    maple pollen  . Anxiety   . Arthritis   . Bowel obstruction ([place])   . Chronic low back pain    Dr. Trenton Gammon.  Has had back injection--bp elevated after.  . Chronic pain syndrome   . Chronic renal insufficiency, stage 2 (mild)    GFR 60-70  . DDD (degenerative disc disease), cervical    Hx of ACDF (Dr. [name]).  Followed by Dr. [name].  Also, Dr. [name] do left C7-T1 intralaminal epidural injection.  . Diverticulosis of colon   . DJD (degenerative joint disease)   . Gallstones   . GERD (gastroesophageal reflux disease)   . Herpes zoster 07/08/2014  . 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  Resid R arm and leg weakness + flexion contractures.  . Hypercholesteremia    mild; pt declined statin trial 05/2014--needs recheck lipid panel at first f/u visit in 2017  . Hypertension   . Osteopenia 06/2017   T score -1.4 FRAX 15% / 2%  . Ovarian cyst 2017   82019-robotic assisted bilatel SPO: all path benign.  . Perianal dermatitis    prn cutivate  .  Phlebitis   . Right hemiplegia ([place]) 01/2020   R arm and leg (hemorrhagic L cerebral hemisphere CVA)  . Right knee injury 2018   Patellofemoral crush injury--Dr. [name].  . Trochanteric bursitis of right hip    Recurrent (injection by Dr. [name] 05/26/15)  . Tympanic membrane rupture, right 12/01/2016   As of 08/2018 pt set for tympanomastoidectomy and STSG (Dr. [name]). Recurrent fungal/bact OE.  . Varicose vein    left leg     Past Surgical History:  Procedure Laterality Date  . ABDOMINAL HYSTERECTOMY  03/2015   TAH.  Last pap 2016.  No hx of abnl paps.  Per GYN, no further pap smears indicated.  . ANTERIOR AND POSTERIOR REPAIR N/A 03/18/2014   Procedure: Cystocele repair with graft, Vault suspension, Rectocele repair;  Surgeon: [name], MD;  [facility];  Service: Urology;  Laterality: N/A;  . CHOLECYSTECTOMY    . CHOLECYSTECTOMY, LAPAROSCOPIC    . COLON SURGERY    . COLONOSCOPY  04/2006; 05/26/16   2007 (Dr. [name]): Normal.  04/2016 (Dr. [name]) normal except diverticulosis and decreased anal sphincter tone.  No repeat colonoscopy is recommended due to age.  . cspine surgery     Dr. Wiliam Ke level ant cerv discectomy /fusion w/plating  . CYSTO N/A 03/18/2014   Procedure:  CYSTO;  Surgeon: [name], MD;  [facility];  Service: Urology;  Laterality: N/A;  . DEXA  07/30/2015; 06/2018   2017 and 2020 -->Osteopenia--repeat 2 yrs.  Marland Kitchen LYSIS OF ADHESION N/A 01/31/2018   Procedure: POSSIBLE LYSIS OF ADHESION;  Surgeon: Everitt Amber, MD;  [facility];  Service: Gynecology;  Laterality: N/A;  . [place]     with prolasped bledder repair  . RESECTION OF COLON     BENIGN TUMOR  . RIGHT EAR SURGERY  08/23/2017   Dr. 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 right ear surgery  12/12/2018   Right  tympanomastoidectomy, ossiculoplasty with partial prosthesis, split thickness skin graft from postauricular skin 1x1cm, and right tragal cartilage graft 1x1 cm [facility])  . right hemicolectomy for diverticulitis with abscess  1993  . ROBOTIC ASSISTED BILATERAL SALPINGO OOPHERECTOMY Bilateral 01/31/2018   All PATH benign.  Procedure: XI ROBOTIC ASSISTED BILATERAL SALPINGO OOPHORECTOMY;  Surgeon: Everitt Amber, MD;  [facility];  Service: Gynecology;  Laterality: Bilateral;  . TRANSTHORACIC ECHOCARDIOGRAM  01/27/2020   EF 65-70%, NORMAL.  . TUBAL LIGATION      There were no vitals filed for this visit.   Subjective Assessment - 09/24/20 0841    Subjective  S: My other hand wants to step in and help.    Currently in Pain? Yes    Pain Score 5     Pain Location Foot    Pain Orientation Right    Pain Descriptors / Indicators Aching;Burning    Pain Type Acute pain    Pain Onset More than a month ago    Pain Frequency Intermittent              [facility] OT Assessment - 09/24/20 0844      Assessment   Medical Diagnosis Right side weakness S/P CVA      Precautions   Precautions Fall    Required Braces or Orthoses Other Brace/Splint    Other Brace/Splint Right AFO                    OT Treatments/Exercises (OP) - 09/24/20 6269      Exercises   Exercises Hand;Shoulder      Hand Exercises   Hand Gripper with Small Beads all beads with gripper set at 20# horizontal      Functional Reaching Activities   Low Level Squigz used while seated at table using right UE to reach and place Squigz on table. Pt stood to remove Squigz from table.    High Level Standing at door, patient used her Right hand to reach into container for Squigz prior to placing it on the doors at a mid to high level. Removed [place]rington Ellison once all were placed.                    OT Short Term Goals - 09/11/20 0941      OT SHORT TERM GOAL #1   Title Pt will be educated and independent with HEP in order  to faciliate progress in therapy and work towards increasing her RUE strength and coordination while increasing her functional performance during ADL tasks.    Time 3    Period Weeks    Status On-going    Target Date 09/23/20  OT Long Term Goals - 09/11/20 0941      OT LONG TERM GOAL #1   Title Patient will increase right shoulder strength and elbow to 5/5 while demonstrating increased shoulder and scapular stability when reaching away from her body/base of support.    Time 6    Period Weeks    Status On-going      OT LONG TERM GOAL #2   Title Patient will increase right hand coordination by completing the 9 hole peg test in 30 seconds or less in order to roll hair with less difficulty.    Time 6    Period Weeks    Status On-going      OT LONG TERM GOAL #3   Title Patient will increase right hand grip strength by 10# and pinch strength by 5# in order to increase ability to grasp and manipulate items with less difficulty.    Time 6    Period Weeks    Status On-going      OT LONG TERM GOAL #4   Title patient will reach highest level of independence with increased confidence and safety awareness while completing all ADL tasks at modified independent level or better while utilizing adaptive equipment and/or compensatory strategies if needed.    Baseline 3/9: Pt is sponge bathing and does not take a shower unless someone is present for supervision.    Time 6    Period Weeks    Status On-going                 Plan - 09/24/20 0845    Clinical Impression Statement A: Increased time needed to complete handgripper task due to technique required and decreased motor control. Due to hand fatigue after gripping the small beads, patient was unable to complete same task to pick up the large or medium beads. Focused on standing balance and UE stability while completing Squigz task. Stand by assist for balance with intermitten tactile cueing to weightshift more towards right  versus relying on left leg and arm. Patient maintained balance while holding door frame with left hand.    Body Structure / Function / Physical Skills ADL;UE functional use;FMC;ROM;Proprioception;Coordination;GMC;Strength    Plan P: Complete reaching tasks out of her base of support (start sitting then progress to standing) to focus on ADL task independence and safety.    Consulted and Agree with Plan of Care Patient           Patient will benefit from skilled therapeutic intervention in order to improve the following deficits and impairments:   Body Structure / Function / Physical Skills: ADL,UE functional use,FMC,ROM,Proprioception,Coordination,GMC,Strength       Visit Diagnosis: Other symptoms and signs involving the musculoskeletal system  Other lack of coordination    Problem List Patient Active Problem List   Diagnosis Date Noted  . Pain in joint of right foot 04/15/2020  . Right spastic hemiparesis ([place]) 03/04/2020  . Chronic otitis externa of right ear 02/20/2020  . Intraparenchymal hemorrhage of brain ([place])   . Delirium   . 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 Stroke (cerebrum) ([place]) 01/26/2020  . Conductive hearing loss of right ear with unrestricted hearing of left ear 05/28/2019  . Hyponatremia 08/31/2017  . Generalized weakness 08/31/2017  . Dehydration 08/31/2017  . UTI (urinary tract infection) 08/31/2017  . Tympanic membrane rupture, right 12/01/2016  . Right ovarian cyst 08/22/2016  . Fibromyalgia 06/03/2015  . Cystocele 03/18/2014  . 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  Diverticulosis of colon without hemorrhage 06/12/2013  . Hypertension   . Osteopenia   . Chronic low back pain 05/12/2011  . HYPERCHOLESTEROLEMIA 03/17/2008  . Anxiety state 09/17/2007  . GERD 09/17/2007  . DJD (degenerative joint disease) 09/17/2007    Ailene Ravel, OTR/L,CBIS  [phone]  09/24/2020, 8:49 AM  [place] [address] [place], [place], [address] Phone: [phone]   Fax:  [phone]  Name: Alison Cuevas MRN: [id number] Date of Birth: 1946-03-03

## 2020-09-27 ENCOUNTER — Encounter: Payer: Self-pay | Admitting: Adult Health

## 2020-09-28 ENCOUNTER — Ambulatory Visit (HOSPITAL_COMMUNITY): Payer: Medicare Other

## 2020-09-28 ENCOUNTER — Ambulatory Visit (HOSPITAL_COMMUNITY): Payer: Medicare Other | Admitting: Physical Therapy

## 2020-09-28 ENCOUNTER — Encounter (HOSPITAL_COMMUNITY): Payer: Medicare Other

## 2020-09-28 ENCOUNTER — Encounter (HOSPITAL_COMMUNITY): Payer: Self-pay | Admitting: Physical Therapy

## 2020-09-28 ENCOUNTER — Encounter (HOSPITAL_COMMUNITY): Payer: Self-pay

## 2020-09-28 ENCOUNTER — Ambulatory Visit (INDEPENDENT_AMBULATORY_CARE_PROVIDER_SITE_OTHER): Payer: Medicare Other | Admitting: Adult Health

## 2020-09-28 ENCOUNTER — Other Ambulatory Visit: Payer: Self-pay

## 2020-09-28 ENCOUNTER — Encounter: Payer: Self-pay | Admitting: Adult Health

## 2020-09-28 VITALS — BP 114/70 | HR 87 | Ht 60.0 in

## 2020-09-28 DIAGNOSIS — M6281 Muscle weakness (generalized): Secondary | ICD-10-CM

## 2020-09-28 DIAGNOSIS — G8111 Spastic hemiplegia affecting right dominant side: Secondary | ICD-10-CM | POA: Insufficient documentation

## 2020-09-28 DIAGNOSIS — I610 Nontraumatic intracerebral hemorrhage in hemisphere, subcortical: Secondary | ICD-10-CM

## 2020-09-28 DIAGNOSIS — I619 Nontraumatic intracerebral hemorrhage, unspecified: Secondary | ICD-10-CM | POA: Insufficient documentation

## 2020-09-28 DIAGNOSIS — R29898 Other symptoms and signs involving the musculoskeletal system: Secondary | ICD-10-CM

## 2020-09-28 DIAGNOSIS — R2689 Other abnormalities of gait and mobility: Secondary | ICD-10-CM

## 2020-09-28 DIAGNOSIS — R278 Other lack of coordination: Secondary | ICD-10-CM

## 2020-09-28 NOTE — Therapy (Signed)
[place], [place], [address] Phone: [phone]   Fax:  [phone]  Occupational Therapy Treatment  Patient Details  Name: Alison Cuevas MRN: [id number] Date of Birth: 11/30/1945 Referring Provider (OT): Alger Simons, MD   Encounter Date: 09/28/2020   OT End of Session - 09/28/20 1712    Visit Number 7    Number of Visits 12    Date for OT Re-Evaluation 10/14/20    Authorization Type double coverage Medicare 80/20% secondary: [employer] Covers what medicare doesn't.    Progress Note Due on Visit 10    OT Start Time 1435    OT Stop Time 1520    OT Time Calculation (min) 45 min    Activity Tolerance Patient tolerated treatment well    Behavior During Therapy WFL for tasks assessed/performed           Past Medical History:  Diagnosis Date  . Allergic rhinitis    maple pollen  . Anxiety   . Arthritis   . Bowel obstruction ([place])   . Chronic low back pain    Dr. Trenton Gammon.  Has had back injection--bp elevated after.  . Chronic pain syndrome   . Chronic renal insufficiency, stage 2 (mild)    GFR 60-70  . DDD (degenerative disc disease), cervical    Hx of ACDF (Dr. [name]).  Followed by Dr. [name].  Also, Dr. [name] do left C7-T1 intralaminal epidural injection.  . Diverticulosis of colon   . DJD (degenerative joint disease)   . Gallstones   . GERD (gastroesophageal reflux disease)   . Herpes zoster 07/08/2014  . 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  Resid R arm and leg weakness + flexion contractures.  . Hypercholesteremia    mild; pt declined statin trial 05/2014--needs recheck lipid panel at first f/u visit in 2017  . Hypertension   . Osteopenia 06/2017   T score -1.4 FRAX 15% / 2%  . Ovarian cyst 2017   82019-robotic assisted bilatel SPO: all path benign.  . Perianal dermatitis    prn cutivate  . Phlebitis    . Right hemiplegia ([place]) 01/2020   R arm and leg (hemorrhagic L cerebral hemisphere CVA)  . Right knee injury 2018   Patellofemoral crush injury--Dr. [name].  . Trochanteric bursitis of right hip    Recurrent (injection by Dr. [name] 05/26/15)  . Tympanic membrane rupture, right 12/01/2016   As of 08/2018 pt set for tympanomastoidectomy and STSG (Dr. [name]). Recurrent fungal/bact OE.  . Varicose vein    left leg     Past Surgical History:  Procedure Laterality Date  . ABDOMINAL HYSTERECTOMY  03/2015   TAH.  Last pap 2016.  No hx of abnl paps.  Per GYN, no further pap smears indicated.  . ANTERIOR AND POSTERIOR REPAIR N/A 03/18/2014   Procedure: Cystocele repair with graft, Vault suspension, Rectocele repair;  Surgeon: [name], MD;  Location: [facility];  Service: Urology;  Laterality: N/A;  . CHOLECYSTECTOMY    . CHOLECYSTECTOMY, LAPAROSCOPIC    . COLON SURGERY    . COLONOSCOPY  04/2006; 05/26/16   2007 (Dr. [name]): Normal.  04/2016 (Dr. [name]) normal except diverticulosis and decreased anal sphincter tone.  No repeat colonoscopy is recommended due to age.  . cspine surgery     Dr. Wiliam Ke level ant cerv discectomy /fusion w/plating  . CYSTO N/A 03/18/2014   Procedure:  CYSTO;  Surgeon: [name], MD;  Location: [facility];  Service: Urology;  Laterality: N/A;  . DEXA  07/30/2015; 06/2018   2017 and 2020 -->Osteopenia--repeat 2 yrs.  Marland Kitchen LYSIS OF ADHESION N/A 01/31/2018   Procedure: POSSIBLE LYSIS OF ADHESION;  Surgeon: Everitt Amber, MD;  Location: [facility];  Service: Gynecology;  Laterality: N/A;  . [place]     with prolasped bledder repair  . RESECTION OF COLON     BENIGN TUMOR  . RIGHT EAR SURGERY  08/23/2017   Dr. 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 right ear surgery  12/12/2018   Right tympanomastoidectomy,  ossiculoplasty with partial prosthesis, split thickness skin graft from postauricular skin 1x1cm, and right tragal cartilage graft 1x1 cm [facility])  . right hemicolectomy for diverticulitis with abscess  1993  . ROBOTIC ASSISTED BILATERAL SALPINGO OOPHERECTOMY Bilateral 01/31/2018   All PATH benign.  Procedure: XI ROBOTIC ASSISTED BILATERAL SALPINGO OOPHORECTOMY;  Surgeon: Everitt Amber, MD;  Location: [facility];  Service: Gynecology;  Laterality: Bilateral;  . TRANSTHORACIC ECHOCARDIOGRAM  01/27/2020   EF 65-70%, NORMAL.  . TUBAL LIGATION      There were no vitals filed for this visit.   Subjective Assessment - 09/28/20 1442    Subjective  S: The AFO is rubbing on my foot and the Dr. said to talk to the therapist about it.    Currently in Pain? Yes    Pain Score 6     Pain Location Arm    Pain Orientation Right;Left    Pain Descriptors / Indicators Aching;Burning    Pain Type Acute pain    Pain Onset More than a month ago    Pain Frequency Intermittent              [facility] OT Assessment - 09/28/20 1712      Assessment   Medical Diagnosis Right side weakness S/P CVA      Precautions   Precautions Fall    Required Braces or Orthoses Other Brace/Splint    Other Brace/Splint Right AFO                    OT Treatments/Exercises (OP) - 09/28/20 1714      ADLs   ADL Comments ADL re-training completed related to functional mobility and transfering into walk-in shower. Used 10# hand weights to provide border and height reference for step over. Simulated grab bars from home set up.                    OT Short Term Goals - 09/11/20 0941      OT SHORT TERM GOAL #1   Title Pt will be educated and independent with HEP in order to faciliate progress in therapy and work towards increasing her RUE strength and coordination while increasing her functional performance during ADL tasks.    Time 3    Period Weeks    Status On-going    Target Date 09/23/20              OT Long Term Goals - 09/11/20 0941      OT LONG TERM GOAL #1   Title Patient will increase right shoulder strength and elbow to 5/5 while demonstrating increased shoulder and scapular stability when reaching away from her body/base of support.    Time 6  Period Weeks    Status On-going      OT LONG TERM GOAL #2   Title Patient will increase right hand coordination by completing the 9 hole peg test in 30 seconds or less in order to roll hair with less difficulty.    Time 6    Period Weeks    Status On-going      OT LONG TERM GOAL #3   Title Patient will increase right hand grip strength by 10# and pinch strength by 5# in order to increase ability to grasp and manipulate items with less difficulty.    Time 6    Period Weeks    Status On-going      OT LONG TERM GOAL #4   Title patient will reach highest level of independence with increased confidence and safety awareness while completing all ADL tasks at modified independent level or better while utilizing adaptive equipment and/or compensatory strategies if needed.    Baseline 3/9: Pt is sponge bathing and does not take a shower unless someone is present for supervision.    Time 6    Period Weeks    Status On-going                 Plan - 09/28/20 1712    Clinical Impression Statement A: Discussed fears and reason for not bathing in the shower since she has finished her home health OT sessions. Patient has been sponge bathing since. She reports her daughter isn't comfortable helping her although her sister and a friend have mentioned that they would help and Bibiana just needs to ask them. Simulated transfer into walk-in shower by stepping over handweights placed on floor and using simulated grab bars. Pt was able to verbalize her technique from working with her previous OT at home. Discussed ways to remain modest using a bathrobe if her daughter was to help her. Patient verbalized understanding and she agreed that she needed to try  it. Encouraged her to complete a shower with her sister or friend this week.    Body Structure / Function / Physical Skills ADL;UE functional use;FMC;ROM;Proprioception;Coordination;GMC;Strength    Plan P: Follow on taking a shower yet. Completed coordination task and hand strengthening.    Consulted and Agree with Plan of Care Patient           Patient will benefit from skilled therapeutic intervention in order to improve the following deficits and impairments:   Body Structure / Function / Physical Skills: ADL,UE functional use,FMC,ROM,Proprioception,Coordination,GMC,Strength       Visit Diagnosis: Other lack of coordination  Other symptoms and signs involving the musculoskeletal system    Problem List Patient Active Problem List   Diagnosis Date Noted  . Pain in joint of right foot 04/15/2020  . Right spastic hemiparesis ([place]) 03/04/2020  . Chronic otitis externa of right ear 02/20/2020  . Intraparenchymal hemorrhage of brain ([place])   . Delirium   . 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 Stroke (cerebrum) ([place]) 01/26/2020  . Conductive hearing loss of right ear with unrestricted hearing of left ear 05/28/2019  . Hyponatremia 08/31/2017  . Generalized weakness 08/31/2017  . Dehydration 08/31/2017  . UTI (urinary tract infection) 08/31/2017  . Tympanic membrane rupture, right 12/01/2016  . Right ovarian cyst 08/22/2016  . Fibromyalgia 06/03/2015  . Cystocele 03/18/2014  . 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 Diverticulosis of colon without hemorrhage 06/12/2013  . Hypertension   . Osteopenia   . Chronic low back  pain 05/12/2011  . HYPERCHOLESTEROLEMIA 03/17/2008  . Anxiety state 09/17/2007  . GERD 09/17/2007  . DJD (degenerative joint disease) 09/17/2007    Ailene Ravel, OTR/L,CBIS  [phone]  09/28/2020, 5:19 PM  [place] [address] [place], [place],  [address] Phone: [phone]   Fax:  [phone]  Name: Seryna Marek MRN: [id number] Date of Birth: [birth year]

## 2020-09-28 NOTE — Patient Instructions (Signed)
Continue working with outpatient therapies for hopeful ongoing recovery  Okay to restart Meloxicam as I believe the benefits greatly outweigh potential risks at this time - refills will be obtained by your PCP  Please let me know when you are ready for a sleep evaluation and I will place referral   Continue aspirin 81 mg daily for secondary stroke prevention  Continue to follow up with PCP regarding cholesterol and blood pressure management  Maintain strict control of hypertension with blood pressure goal below 130/90 and cholesterol with LDL cholesterol (bad cholesterol) goal below 70 mg/dL.         Followup in the future with me in 6 months or call earlier if needed       Thank you for coming to see [place] at [facility]. I hope we have been able to provide you high quality care today.  You may receive a patient satisfaction survey over the next few weeks. We would appreciate your feedback and comments so that we may continue to improve ourselves and the health of our patients.
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
## 2020-09-28 NOTE — Therapy (Signed)
[place], [place], [address] Phone: [phone]   Fax:  [phone]  Physical Therapy Treatment  Patient Details  Name: Alison Cuevas MRN: [id number] Date of Birth: [birth year] Referring Provider (PT): Alger Simons MD   Encounter Date: 09/28/2020   PT End of Session - 09/28/20 1517    Visit Number 8    Number of Visits 16    Date for PT Re-Evaluation 10/28/20    Authorization Type Medicare A/ [place] NAP 2ndary    Progress Note Due on Visit 10    PT Start Time 1520    PT Stop Time 1600    PT Time Calculation (min) 40 min    Activity Tolerance Patient tolerated treatment well    Behavior During Therapy [facility] for tasks assessed/performed           Past Medical History:  Diagnosis Date  . Allergic rhinitis    maple pollen  . Anxiety   . Arthritis   . Bowel obstruction ([place])   . Chronic low back pain    Dr. Trenton Gammon.  Has had back injection--bp elevated after.  . Chronic pain syndrome   . Chronic renal insufficiency, stage 2 (mild)    GFR 60-70  . DDD (degenerative disc disease), cervical    Hx of ACDF (Dr. [name]).  Followed by Dr. [name].  Also, Dr. [name] do left C7-T1 intralaminal epidural injection.  . Diverticulosis of colon   . DJD (degenerative joint disease)   . Gallstones   . GERD (gastroesophageal reflux disease)   . Herpes zoster 07/08/2014  . 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  Resid R arm and leg weakness + flexion contractures.  . Hypercholesteremia    mild; pt declined statin trial 05/2014--needs recheck lipid panel at first f/u visit in 2017  . Hypertension   . Osteopenia 06/2017   T score -1.4 FRAX 15% / 2%  . Ovarian cyst 2017   82019-robotic assisted bilatel SPO: all path benign.  . Perianal dermatitis    prn cutivate  . Phlebitis   . Right hemiplegia ([place]) 01/2020   R arm and leg  (hemorrhagic L cerebral hemisphere CVA)  . Right knee injury 2018   Patellofemoral crush injury--Dr. [name].  . Trochanteric bursitis of right hip    Recurrent (injection by Dr. [name] 05/26/15)  . Tympanic membrane rupture, right 12/01/2016   As of 08/2018 pt set for tympanomastoidectomy and STSG (Dr. [name]). Recurrent fungal/bact OE.  . Varicose vein    left leg     Past Surgical History:  Procedure Laterality Date  . ABDOMINAL HYSTERECTOMY  03/2015   TAH.  Last pap 2016.  No hx of abnl paps.  Per GYN, no further pap smears indicated.  . ANTERIOR AND POSTERIOR REPAIR N/A 03/18/2014   Procedure: Cystocele repair with graft, Vault suspension, Rectocele repair;  Surgeon: [name], MD;  Location: [facility];  Service: Urology;  Laterality: N/A;  . CHOLECYSTECTOMY    . CHOLECYSTECTOMY, LAPAROSCOPIC    . COLON SURGERY    . COLONOSCOPY  04/2006; 05/26/16   2007 (Dr. [name]): Normal.  04/2016 (Dr. [name]) normal except diverticulosis and decreased anal sphincter tone.  No repeat colonoscopy is recommended due to age.  . cspine surgery     Dr. Wiliam Ke level ant cerv discectomy /fusion w/plating  . CYSTO N/A 03/18/2014   Procedure: CYSTO;  Surgeon: Elayne Snare  MacDiarmid, MD;  Location: [facility];  Service: Urology;  Laterality: N/A;  . DEXA  07/30/2015; 06/2018   2017 and 2020 -->Osteopenia--repeat 2 yrs.  Marland Kitchen LYSIS OF ADHESION N/A 01/31/2018   Procedure: POSSIBLE LYSIS OF ADHESION;  Surgeon: Everitt Amber, MD;  Location: [facility];  Service: Gynecology;  Laterality: N/A;  . [place]     with prolasped bledder repair  . RESECTION OF COLON     BENIGN TUMOR  . RIGHT EAR SURGERY  08/23/2017   Dr. 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 right ear surgery  12/12/2018   Right tympanomastoidectomy, ossiculoplasty with partial prosthesis, split thickness  skin graft from postauricular skin 1x1cm, and right tragal cartilage graft 1x1 cm [facility])  . right hemicolectomy for diverticulitis with abscess  1993  . ROBOTIC ASSISTED BILATERAL SALPINGO OOPHERECTOMY Bilateral 01/31/2018   All PATH benign.  Procedure: XI ROBOTIC ASSISTED BILATERAL SALPINGO OOPHORECTOMY;  Surgeon: Everitt Amber, MD;  [facility]: [facility];  Service: Gynecology;  Laterality: Bilateral;  . TRANSTHORACIC ECHOCARDIOGRAM  01/27/2020   EF 65-70%, NORMAL.  . TUBAL LIGATION      There were no vitals filed for this visit.   Subjective Assessment - 09/28/20 1520    Subjective She had to put her husband in hospice over the weekend. She has not had any falls, she started taking meloxicam again.    Pertinent History LT CVA 01/26/20    Limitations Standing;Walking;House hold activities    Currently in Pain? Yes    Pain Score 5     Pain Location Leg    Pain Orientation Right    Pain Descriptors / Indicators Burning    Pain Onset More than a month ago                             [facility] Adult PT Treatment/Exercise - 09/28/20 0001      Knee/Hip Exercises: Standing   Knee Flexion Both;2 sets;10 reps    Knee Flexion Limitations 2#    Hip Flexion Both;2 sets;10 reps    Hip Flexion Limitations 2#    Forward Step Up Both;1 set;10 reps;Hand Hold: 2;Step Height: 4"    Gait Training 200 feet with hemi walker    Other Standing Knee Exercises 4 inch alternating step taps x 20 2 HHA, x20 1 HHA    Other Standing Knee Exercises lateral stepping 6x 10 feet bilateral      Knee/Hip Exercises: Seated   Long Arc Quad 10 reps    Long Arc Quad Weight 2 lbs.    Long Arc Quad Limitations 5 second holds                  PT Education - 09/28/20 1520    Education Details Patient educated on HEP, exercise mechanics    Person(s) Educated Patient    Methods Explanation;Demonstration    Comprehension Verbalized understanding;Returned demonstration            PT Short Term Goals  - 09/09/20 1551      PT SHORT TERM GOAL #1   Title Patient will be independent with initial HEP and self-management strategies to improve functional outcomes    Time 4    Period Weeks    Status On-going    Target Date 09/30/20      PT SHORT  TERM GOAL #2   Title Patient will be able to ambulate at least 100 feet during 2MWT with LRAD to demonstrate improved ability to perform functional mobility and associated tasks.    Time 4    Period Weeks    Status On-going    Target Date 09/30/20             PT Long Term Goals - 09/09/20 1551      PT LONG TERM GOAL #1   Title Patient will be able to ambulate at least 225 feet during 2MWT with LRAD to demonstrate improved ability to perform functional mobility and associated tasks.    Time 8    Period Weeks    Status On-going      PT LONG TERM GOAL #2   Title Patient will have equal to or > 4/5 MMT throughout RLE to improve ability to perform functional mobility, stair ambulation and ADLs.    Time 8    Period Weeks    Status On-going      PT LONG TERM GOAL #3   Title Patient will be able to maintain semi tandem stance >30 seconds on BLEs to improve stability and reduce risk for falls    Time 8    Period Weeks    Status On-going                 Plan - 09/28/20 1518    Clinical Impression Statement Patient able to perform most previously completed exercises with increased resistance/reps today. Patient continues to show impaired static and dynamic balance requiring HHA throughout to complete. Patient requires several seated rest breaks throughout session due to fatigue and decreased activity tolerance. Patient given cueing for posture, controlled exercises, and decreased UE support throughout session. Patient ambulates with good mechanics with hemi walker with 1 slight loss of balance with fatigue at end of ambulation. Patient will continue to benefit from skilled physical therapy in order to reduce impairment and improve function.     Personal Factors and Comorbidities Age;Comorbidity 1    Comorbidities CVA    Examination-Activity Limitations Locomotion Level;Bed Mobility;Stand;Stairs;Transfers    Examination-Participation Restrictions Community Activity;Yard Work;Laundry;Driving;Cleaning    Stability/Clinical Decision Making Stable/Uncomplicated    Rehab Potential Good    PT Frequency 2x / week    PT Duration 8 weeks    PT Treatment/Interventions ADLs/Self Care Home Management;Aquatic Therapy;Iontophoresis 4mg /ml Dexamethasone;DME Instruction;Balance training;Passive range of motion;Scar mobilization;Neuromuscular re-education;Gait training;Stair training;Functional mobility training;Patient/family education;Manual techniques;Dry needling;Energy conservation;Manual lymph drainage;Parrafin;Ultrasound;Therapeutic activities;Canalith Repostioning;Moist Heat;Traction;Biofeedback;Cryotherapy;Fluidtherapy;Electrical Stimulation;Contrast Bath;Therapeutic exercise;Orthotic Fit/Training;Compression bandaging;Splinting;Taping;Vasopneumatic Device;Joint Manipulations;Spinal Manipulations;Visual/perceptual remediation/compensation;Vestibular    PT Next Visit Plan Continue standing LE strength, static balance and gait. Progress gait and balance as able.    PT Home Exercise Plan Eval: seated march, heel/toe raise, LAQs; 09/17/20: STS, standing abduction and marching wiht UE support counter 3/30 staggered stance with support    Consulted and Agree with Plan of Care Patient           Patient will benefit from skilled therapeutic intervention in order to improve the following deficits and impairments:  Pain,Improper body mechanics,Abnormal gait,Decreased balance,Decreased activity tolerance,Decreased range of motion,Decreased strength,Hypomobility,Decreased mobility,Impaired sensation,Difficulty walking  Visit Diagnosis: Muscle weakness (generalized)  Other abnormalities of gait and mobility     Problem List Patient Active Problem  List   Diagnosis Date Noted  . Pain in joint of right foot 04/15/2020  . Right spastic hemiparesis ([place]) 03/04/2020  . Chronic otitis externa of right ear 02/20/2020  . Intraparenchymal hemorrhage of  brain ([place])   . Delirium   . 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 Stroke (cerebrum) ([place]) 01/26/2020  . Conductive hearing loss of right ear with unrestricted hearing of left ear 05/28/2019  . Hyponatremia 08/31/2017  . Generalized weakness 08/31/2017  . Dehydration 08/31/2017  . UTI (urinary tract infection) 08/31/2017  . Tympanic membrane rupture, right 12/01/2016  . Right ovarian cyst 08/22/2016  . Fibromyalgia 06/03/2015  . Cystocele 03/18/2014  . 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 Diverticulosis of colon without hemorrhage 06/12/2013  . Hypertension   . Osteopenia   . Chronic low back pain 05/12/2011  . HYPERCHOLESTEROLEMIA 03/17/2008  . Anxiety state 09/17/2007  . GERD 09/17/2007  . DJD (degenerative joint disease) 09/17/2007    4:05 PM, 09/28/20 Mearl Latin PT, DPT Physical Therapist at [place], [place], [address] Phone: [phone]   Fax:  [phone]  Name: Glynn Freas MRN: [id number] Date of Birth: 08-15-1945

## 2020-09-29 ENCOUNTER — Ambulatory Visit (HOSPITAL_COMMUNITY): Payer: Medicare Other

## 2020-09-29 NOTE — Progress Notes (Signed)
I agree with the above plan 

## 2020-09-30 ENCOUNTER — Ambulatory Visit (HOSPITAL_COMMUNITY): Payer: Medicare Other

## 2020-09-30 ENCOUNTER — Ambulatory Visit (HOSPITAL_COMMUNITY): Payer: Medicare Other | Admitting: Physical Therapy

## 2020-10-01 ENCOUNTER — Encounter (HOSPITAL_COMMUNITY): Payer: Medicare Other | Admitting: Occupational Therapy

## 2020-10-01 ENCOUNTER — Ambulatory Visit (HOSPITAL_COMMUNITY): Payer: Medicare Other

## 2020-10-02 ENCOUNTER — Telehealth (HOSPITAL_COMMUNITY): Payer: Self-pay | Admitting: Specialist

## 2020-10-02 NOTE — Telephone Encounter (Signed)
Son called to cx his dad is in hospic care and Calista will not leave his side. They may need to cx other appointments and will keep [place] informed.

## 2020-10-05 ENCOUNTER — Ambulatory Visit (HOSPITAL_COMMUNITY): Payer: Medicare Other | Admitting: Specialist

## 2020-10-05 ENCOUNTER — Ambulatory Visit (HOSPITAL_COMMUNITY): Payer: Medicare Other

## 2020-10-07 ENCOUNTER — Ambulatory Visit (HOSPITAL_COMMUNITY): Payer: Medicare Other

## 2020-10-07 ENCOUNTER — Ambulatory Visit (HOSPITAL_COMMUNITY): Payer: Medicare Other | Admitting: Physical Therapy

## 2020-10-08 ENCOUNTER — Encounter (HOSPITAL_COMMUNITY): Payer: Self-pay | Admitting: Physical Therapy

## 2020-10-08 ENCOUNTER — Ambulatory Visit (HOSPITAL_COMMUNITY): Payer: Medicare Other

## 2020-10-08 ENCOUNTER — Ambulatory Visit (HOSPITAL_COMMUNITY): Payer: Medicare Other | Admitting: Physical Therapy

## 2020-10-08 ENCOUNTER — Other Ambulatory Visit: Payer: Self-pay

## 2020-10-08 ENCOUNTER — Encounter (HOSPITAL_COMMUNITY): Payer: Self-pay

## 2020-10-08 DIAGNOSIS — R2689 Other abnormalities of gait and mobility: Secondary | ICD-10-CM

## 2020-10-08 DIAGNOSIS — R278 Other lack of coordination: Secondary | ICD-10-CM

## 2020-10-08 DIAGNOSIS — R29898 Other symptoms and signs involving the musculoskeletal system: Secondary | ICD-10-CM

## 2020-10-08 DIAGNOSIS — M6281 Muscle weakness (generalized): Secondary | ICD-10-CM

## 2020-10-08 DIAGNOSIS — G8111 Spastic hemiplegia affecting right dominant side: Secondary | ICD-10-CM | POA: Diagnosis not present

## 2020-10-08 NOTE — Therapy (Signed)
[place], [place], [address] Phone: [phone]   Fax:  [phone]  Physical Therapy Treatment  Patient Details  Name: Alison Cuevas MRN: [id number] Date of Birth: Jul 27, 1945 Referring Provider (PT): Alger Simons MD   Encounter Date: 10/08/2020   PT End of Session - 10/08/20 1202    Visit Number 9    Number of Visits 16    Date for PT Re-Evaluation 10/28/20    Authorization Type Medicare A/ [place] NAP 2ndary    Progress Note Due on Visit 10    PT Start Time 1203    PT Stop Time 1243    PT Time Calculation (min) 40 min    Activity Tolerance Patient tolerated treatment well    Behavior During Therapy [facility] for tasks assessed/performed           Past Medical History:  Diagnosis Date  . Allergic rhinitis    maple pollen  . Anxiety   . Arthritis   . Bowel obstruction ([place])   . Chronic low back pain    Dr. Trenton Gammon.  Has had back injection--bp elevated after.  . Chronic pain syndrome   . Chronic renal insufficiency, stage 2 (mild)    GFR 60-70  . DDD (degenerative disc disease), cervical    Hx of ACDF (Dr. [name]).  Followed by Dr. [name].  Also, Dr. [name] do left C7-T1 intralaminal epidural injection.  . Diverticulosis of colon   . DJD (degenerative joint disease)   . Gallstones   . GERD (gastroesophageal reflux disease)   . Herpes zoster 07/08/2014  . 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  Resid R arm and leg weakness + flexion contractures.  . Hypercholesteremia    mild; pt declined statin trial 05/2014--needs recheck lipid panel at first f/u visit in 2017  . Hypertension   . Osteopenia 06/2017   T score -1.4 FRAX 15% / 2%  . Ovarian cyst 2017   82019-robotic assisted bilatel SPO: all path benign.  . Perianal dermatitis    prn cutivate  . Phlebitis   . Right hemiplegia ([place]) 01/2020   R arm and leg  (hemorrhagic L cerebral hemisphere CVA)  . Right knee injury 2018   Patellofemoral crush injury--Dr. [name].  . Trochanteric bursitis of right hip    Recurrent (injection by Dr. [name] 05/26/15)  . Tympanic membrane rupture, right 12/01/2016   As of 08/2018 pt set for tympanomastoidectomy and STSG (Dr. [name]). Recurrent fungal/bact OE.  . Varicose vein    left leg     Past Surgical History:  Procedure Laterality Date  . ABDOMINAL HYSTERECTOMY  03/2015   TAH.  Last pap 2016.  No hx of abnl paps.  Per GYN, no further pap smears indicated.  . ANTERIOR AND POSTERIOR REPAIR N/A 03/18/2014   Procedure: Cystocele repair with graft, Vault suspension, Rectocele repair;  Surgeon: [name], MD;  [facility]: [facility];  Service: Urology;  Laterality: N/A;  . CHOLECYSTECTOMY    . CHOLECYSTECTOMY, LAPAROSCOPIC    . COLON SURGERY    . COLONOSCOPY  04/2006; 05/26/16   2007 (Dr. [name]): Normal.  04/2016 (Dr. [name]) normal except diverticulosis and decreased anal sphincter tone.  No repeat colonoscopy is recommended due to age.  . cspine surgery     Dr. Wiliam Ke level ant cerv discectomy /fusion w/plating  . CYSTO N/A 03/18/2014   Procedure: CYSTO;  Surgeon: Elayne Snare  MacDiarmid, MD;  Location: [facility];  Service: Urology;  Laterality: N/A;  . DEXA  07/30/2015; 06/2018   2017 and 2020 -->Osteopenia--repeat 2 yrs.  Marland Kitchen LYSIS OF ADHESION N/A 01/31/2018   Procedure: POSSIBLE LYSIS OF ADHESION;  Surgeon: Everitt Amber, MD;  Location: [facility];  Service: Gynecology;  Laterality: N/A;  . [place]     with prolasped bledder repair  . RESECTION OF COLON     BENIGN TUMOR  . RIGHT EAR SURGERY  08/23/2017   Dr. 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 right ear surgery  12/12/2018   Right tympanomastoidectomy, ossiculoplasty with partial prosthesis, split thickness  skin graft from postauricular skin 1x1cm, and right tragal cartilage graft 1x1 cm [facility])  . right hemicolectomy for diverticulitis with abscess  1993  . ROBOTIC ASSISTED BILATERAL SALPINGO OOPHERECTOMY Bilateral 01/31/2018   All PATH benign.  Procedure: XI ROBOTIC ASSISTED BILATERAL SALPINGO OOPHORECTOMY;  Surgeon: Everitt Amber, MD;  [facility];  Service: Gynecology;  Laterality: Bilateral;  . TRANSTHORACIC ECHOCARDIOGRAM  01/27/2020   EF 65-70%, NORMAL.  . TUBAL LIGATION      There were no vitals filed for this visit.   Subjective Assessment - 10/08/20 1215    Subjective States her husband passed yesterday but she is here as her husband would want her here.    Pertinent History LT CVA 01/26/20    Limitations Standing;Walking;House hold activities    Currently in Pain? Yes    Pain Score 7     Pain Location Leg    Pain Orientation Right    Pain Descriptors / Indicators Other (Comment)   stiffness   Pain Onset More than a month ago                             [facility] Adult PT Treatment/Exercise - 10/08/20 1224      Ambulation/Gait   Ambulation/Gait Yes    Ambulation/Gait Assistance 4: Min guard    Ambulation Distance (Feet) 226 Feet    Assistive device --   hemi walker   Gait Pattern Decreased step length - left;Decreased stance time - right;Decreased stride length;Decreased dorsiflexion - right;Step-to pattern    Ambulation Surface Level;Indoor    Gait Comments 1 standing rest break      Knee/Hip Exercises: Standing   Lateral Step Up Right;2 sets;Hand Hold: 2;Step Height: 4";10 reps   SBA   Forward Step Up Both;1 set;10 reps;Hand Hold: 2;Step Height: 4"    Other Standing Knee Exercises staggered stance balance with one foot on step (4" ) - x10 15" holds Bilateral wiht one hand assist      Knee/Hip Exercises: Seated   Long Arc Quad 10 reps   2 sets   Long Arc Quad Weight 2 lbs.    Long [employer] Limitations 5 second holds    Sit to [employer] 15 reps;with UE  support   with black cushion                 PT Education - 10/08/20 1407    Education Details on getting hemi walker for home use.    Person(s) Educated Patient    Methods Explanation    Comprehension Verbalized understanding            PT Short Term Goals - 09/09/20 1551  PT SHORT TERM GOAL #1   Title Patient will be independent with initial HEP and self-management strategies to improve functional outcomes    Time 4    Period Weeks    Status On-going    Target Date 09/30/20      PT SHORT TERM GOAL #2   Title Patient will be able to ambulate at least 100 feet during 2MWT with LRAD to demonstrate improved ability to perform functional mobility and associated tasks.    Time 4    Period Weeks    Status On-going    Target Date 09/30/20             PT Long Term Goals - 09/09/20 1551      PT LONG TERM GOAL #1   Title Patient will be able to ambulate at least 225 feet during 2MWT with LRAD to demonstrate improved ability to perform functional mobility and associated tasks.    Time 8    Period Weeks    Status On-going      PT LONG TERM GOAL #2   Title Patient will have equal to or > 4/5 MMT throughout RLE to improve ability to perform functional mobility, stair ambulation and ADLs.    Time 8    Period Weeks    Status On-going      PT LONG TERM GOAL #3   Title Patient will be able to maintain semi tandem stance >30 seconds on BLEs to improve stability and reduce risk for falls    Time 8    Period Weeks    Status On-going                 Plan - 10/08/20 1216    Clinical Impression Statement Discussed getting hemi walker for home use. Patient did well today with strength training exercises but required min assist with balance exercises. Reassured patient throughout session as she was upset about husband's recent death.  Will continue with current POC as indicated.    Personal Factors and Comorbidities Age;Comorbidity 1    Comorbidities CVA     Examination-Activity Limitations Locomotion Level;Bed Mobility;Stand;Stairs;Transfers    Examination-Participation Restrictions Community Activity;Yard Work;Laundry;Driving;Cleaning    Stability/Clinical Decision Making Stable/Uncomplicated    Rehab Potential Good    PT Frequency 2x / week    PT Duration 8 weeks    PT Treatment/Interventions ADLs/Self Care Home Management;Aquatic Therapy;Iontophoresis 4mg /ml Dexamethasone;DME Instruction;Balance training;Passive range of motion;Scar mobilization;Neuromuscular re-education;Gait training;Stair training;Functional mobility training;Patient/family education;Manual techniques;Dry needling;Energy conservation;Manual lymph drainage;Parrafin;Ultrasound;Therapeutic activities;Canalith Repostioning;Moist Heat;Traction;Biofeedback;Cryotherapy;Fluidtherapy;Electrical Stimulation;Contrast Bath;Therapeutic exercise;Orthotic Fit/Training;Compression bandaging;Splinting;Taping;Vasopneumatic Device;Joint Manipulations;Spinal Manipulations;Visual/perceptual remediation/compensation;Vestibular    PT Next Visit Plan PN next session. Continue standing LE strength, static balance and gait. Progress gait and balance as able.    PT Home Exercise Plan Eval: seated march, heel/toe raise, LAQs; 09/17/20: STS, standing abduction and marching wiht UE support counter 3/30 staggered stance with support    Consulted and Agree with Plan of Care Patient           Patient will benefit from skilled therapeutic intervention in order to improve the following deficits and impairments:  Pain,Improper body mechanics,Abnormal gait,Decreased balance,Decreased activity tolerance,Decreased range of motion,Decreased strength,Hypomobility,Decreased mobility,Impaired sensation,Difficulty walking  Visit Diagnosis: Other symptoms and signs involving the musculoskeletal system  Muscle weakness (generalized)  Other abnormalities of gait and mobility     Problem List Patient Active Problem  List   Diagnosis Date Noted  . Pain in joint of right foot 04/15/2020  . Right spastic hemiparesis ([place]) 03/04/2020  .  Chronic otitis externa of right ear 02/20/2020  . Intraparenchymal hemorrhage of brain ([place])   . Delirium   . 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 Stroke (cerebrum) ([place]) 01/26/2020  . Conductive hearing loss of right ear with unrestricted hearing of left ear 05/28/2019  . Hyponatremia 08/31/2017  . Generalized weakness 08/31/2017  . Dehydration 08/31/2017  . UTI (urinary tract infection) 08/31/2017  . Tympanic membrane rupture, right 12/01/2016  . Right ovarian cyst 08/22/2016  . Fibromyalgia 06/03/2015  . Cystocele 03/18/2014  . 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 Diverticulosis of colon without hemorrhage 06/12/2013  . Hypertension   . Osteopenia   . Chronic low back pain 05/12/2011  . HYPERCHOLESTEROLEMIA 03/17/2008  . Anxiety state 09/17/2007  . GERD 09/17/2007  . DJD (degenerative joint disease) 09/17/2007    2:08 PM, 10/08/20 Jerene Pitch, DPT Physical Therapy with [facility]  [phone] office  [place] [address] [place], [place], [address] Phone: [phone]   Fax:  [phone]  Name: Meta Kroenke MRN: [id number] Date of Birth: 07-26-1945

## 2020-10-08 NOTE — Therapy (Signed)
[facility] [place], [place], [address] Phone: [phone]   Fax:  [phone]  Occupational Therapy Treatment  Patient Details  Name: Alison Cuevas MRN: [id number] Date of Birth: September 09, 1945 Referring Provider (OT): Alger Simons, MD   Encounter Date: 10/08/2020   OT End of Session - 10/08/20 1212    Visit Number 8    Number of Visits 12    Date for OT Re-Evaluation 10/14/20    Authorization Type double coverage Medicare 80/20% secondary: [employer] Covers what medicare doesn't.    Progress Note Due on Visit 10    OT Start Time 1115    OT Stop Time 1155    OT Time Calculation (min) 40 min    Activity Tolerance Patient tolerated treatment well    Behavior During Therapy WFL for tasks assessed/performed           Past Medical History:  Diagnosis Date  . Allergic rhinitis    maple pollen  . Anxiety   . Arthritis   . Bowel obstruction ([place])   . Chronic low back pain    Dr. Trenton Gammon.  Has had back injection--bp elevated after.  . Chronic pain syndrome   . Chronic renal insufficiency, stage 2 (mild)    GFR 60-70  . DDD (degenerative disc disease), cervical    Hx of ACDF (Dr. [name]).  Followed by Dr. [name].  Also, Dr. [name] do left C7-T1 intralaminal epidural injection.  . Diverticulosis of colon   . DJD (degenerative joint disease)   . Gallstones   . GERD (gastroesophageal reflux disease)   . Herpes zoster 07/08/2014  . 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  Resid R arm and leg weakness + flexion contractures.  . Hypercholesteremia    mild; pt declined statin trial 05/2014--needs recheck lipid panel at first f/u visit in 2017  . Hypertension   . Osteopenia 06/2017   T score -1.4 FRAX 15% / 2%  . Ovarian cyst 2017   [id number]-robotic assisted bilatel SPO: all path benign.  . Perianal dermatitis    prn cutivate  .  Phlebitis   . Right hemiplegia ([place]) 01/2020   R arm and leg (hemorrhagic L cerebral hemisphere CVA)  . Right knee injury 2018   Patellofemoral crush injury--Dr. [name].  . Trochanteric bursitis of right hip    Recurrent (injection by Dr. [name] 05/26/15)  . Tympanic membrane rupture, right 12/01/2016   As of 08/2018 pt set for tympanomastoidectomy and STSG (Dr. [name]). Recurrent fungal/bact OE.  . Varicose vein    left leg     Past Surgical History:  Procedure Laterality Date  . ABDOMINAL HYSTERECTOMY  03/2015   TAH.  Last pap 2016.  No hx of abnl paps.  Per GYN, no further pap smears indicated.  . ANTERIOR AND POSTERIOR REPAIR N/A 03/18/2014   Procedure: Cystocele repair with graft, Vault suspension, Rectocele repair;  Surgeon: [name], MD;  Location: [facility];  Service: Urology;  Laterality: N/A;  . CHOLECYSTECTOMY    . CHOLECYSTECTOMY, LAPAROSCOPIC    . COLON SURGERY    . COLONOSCOPY  04/2006; 05/26/16   2007 (Dr. [name]): Normal.  04/2016 (Dr. [name]) normal except diverticulosis and decreased anal sphincter tone.  No repeat colonoscopy is recommended due to age.  . cspine surgery     Dr. Wiliam Ke level ant cerv discectomy /fusion w/plating  . CYSTO N/A 03/18/2014   Procedure:  CYSTO;  Surgeon: [name], MD;  Location: [facility];  Service: Urology;  Laterality: N/A;  . DEXA  07/30/2015; 06/2018   2017 and 2020 -->Osteopenia--repeat 2 yrs.  Marland Kitchen LYSIS OF ADHESION N/A 01/31/2018   Procedure: POSSIBLE LYSIS OF ADHESION;  Surgeon: Everitt Amber, MD;  Location: [facility];  Service: Gynecology;  Laterality: N/A;  . [place]     with prolasped bledder repair  . RESECTION OF COLON     BENIGN TUMOR  . RIGHT EAR SURGERY  08/23/2017   Dr. 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 right ear surgery  12/12/2018   Right  tympanomastoidectomy, ossiculoplasty with partial prosthesis, split thickness skin graft from postauricular skin 1x1cm, and right tragal cartilage graft 1x1 cm [facility])  . right hemicolectomy for diverticulitis with abscess  1993  . ROBOTIC ASSISTED BILATERAL SALPINGO OOPHERECTOMY Bilateral 01/31/2018   All PATH benign.  Procedure: XI ROBOTIC ASSISTED BILATERAL SALPINGO OOPHORECTOMY;  Surgeon: Everitt Amber, MD;  Location: [facility];  Service: Gynecology;  Laterality: Bilateral;  . TRANSTHORACIC ECHOCARDIOGRAM  01/27/2020   EF 65-70%, NORMAL.  . TUBAL LIGATION      There were no vitals filed for this visit.   Subjective Assessment - 10/08/20 1213    Currently in Pain? Yes    Pain Score 6     Pain Location Arm    Pain Orientation Right    Pain Descriptors / Indicators Aching;Sore    Pain Type Acute pain    Pain Onset In the past 7 days    Pain Frequency Constant    Aggravating Factors  from exercises and use    Pain Relieving Factors pain medication, rest    Effect of Pain on Daily Activities mod effect    Multiple Pain Sites Yes    Pain Score 7    Pain Location Leg    Pain Orientation Right    Pain Descriptors / Indicators Aching;Burning    Pain Type Acute pain    Pain Onset 1 to 4 weeks ago    Pain Frequency Constant    Aggravating Factors  walking    Pain Relieving Factors nothing right now    Effect of Pain on Daily Activities mod-max effect              [facility] OT Assessment - 10/08/20 1132      Assessment   Medical Diagnosis Right side weakness S/P CVA      Precautions   Precautions Fall    Required Braces or Orthoses Other Brace/Splint    Other Brace/Splint Right AFO                    OT Treatments/Exercises (OP) - 10/08/20 1131      Exercises   Exercises Hand      Hand Exercises   Hand Gripper with Small Beads all beads with gripper set at 15# horizontal    Sponges 23    Other Hand Exercises utilized red resistive clothespin in right hand with a 3  point pinch to pick up 30 sponges and place in a container.      Fine Motor Coordination (Hand/Wrist)   Fine Motor Coordination Grooved pegs    Grooved pegs Pt completed grooved pegboard while using right hand to place one peg at a time into board with min-mod difficulty and increased time.  OT Short Term Goals - 09/11/20 0941      OT SHORT TERM GOAL #1   Title Pt will be educated and independent with HEP in order to faciliate progress in therapy and work towards increasing her RUE strength and coordination while increasing her functional performance during ADL tasks.    Time 3    Period Weeks    Status On-going    Target Date 09/23/20             OT Long Term Goals - 09/11/20 0941      OT LONG TERM GOAL #1   Title Patient will increase right shoulder strength and elbow to 5/5 while demonstrating increased shoulder and scapular stability when reaching away from her body/base of support.    Time 6    Period Weeks    Status On-going      OT LONG TERM GOAL #2   Title Patient will increase right hand coordination by completing the 9 hole peg test in 30 seconds or less in order to roll hair with less difficulty.    Time 6    Period Weeks    Status On-going      OT LONG TERM GOAL #3   Title Patient will increase right hand grip strength by 10# and pinch strength by 5# in order to increase ability to grasp and manipulate items with less difficulty.    Time 6    Period Weeks    Status On-going      OT LONG TERM GOAL #4   Title patient will reach highest level of independence with increased confidence and safety awareness while completing all ADL tasks at modified independent level or better while utilizing adaptive equipment and/or compensatory strategies if needed.    Baseline 3/9: Pt is sponge bathing and does not take a shower unless someone is present for supervision.    Time 6    Period Weeks    Status On-going                 Plan -  10/08/20 1215    Clinical Impression Statement A: Decreased handgripper resistance to complete gripping activity with small beads. Increased time needed for technique with VC provided. Numbness in right hand made resistive clothespins moderately difficult. Patient demonstrated difficulty with maintaining a functional 3 point pinch on clothespin and required several adjustments.    Body Structure / Function / Physical Skills ADL;UE functional use;FMC;ROM;Proprioception;Coordination;GMC;Strength    Plan P: Reassess and re-cert.    Consulted and Agree with Plan of Care Patient           Patient will benefit from skilled therapeutic intervention in order to improve the following deficits and impairments:   Body Structure / Function / Physical Skills: ADL,UE functional use,FMC,ROM,Proprioception,Coordination,GMC,Strength       Visit Diagnosis: Other lack of coordination  Other symptoms and signs involving the musculoskeletal system    Problem List Patient Active Problem List   Diagnosis Date Noted  . Pain in joint of right foot 04/15/2020  . Right spastic hemiparesis ([place]) 03/04/2020  . Chronic otitis externa of right ear 02/20/2020  . Intraparenchymal hemorrhage of brain ([place])   . Delirium   . 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 Stroke (cerebrum) ([place]) 01/26/2020  . Conductive hearing loss of right ear with unrestricted hearing of left ear 05/28/2019  . Hyponatremia 08/31/2017  . Generalized weakness 08/31/2017  . Dehydration 08/31/2017  . UTI (urinary tract infection)  08/31/2017  . Tympanic membrane rupture, right 12/01/2016  . Right ovarian cyst 08/22/2016  . Fibromyalgia 06/03/2015  . Cystocele 03/18/2014  . 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 Diverticulosis of colon without hemorrhage 06/12/2013  . Hypertension   . Osteopenia   . Chronic low back pain 05/12/2011  . HYPERCHOLESTEROLEMIA 03/17/2008  . Anxiety state 09/17/2007   . GERD 09/17/2007  . DJD (degenerative joint disease) 09/17/2007    Ailene Ravel, OTR/L,CBIS  [phone]  10/08/2020, 12:18 PM  [facility] [address] [place], [place], [address] Phone: [phone]   Fax:  [phone]  Name: Alison Cuevas MRN: [id number] Date of Birth: 1946-01-22

## 2020-10-09 ENCOUNTER — Ambulatory Visit (HOSPITAL_COMMUNITY): Payer: Medicare Other

## 2020-10-09 ENCOUNTER — Encounter (HOSPITAL_COMMUNITY): Payer: Self-pay

## 2020-10-09 DIAGNOSIS — R278 Other lack of coordination: Secondary | ICD-10-CM

## 2020-10-09 DIAGNOSIS — R29898 Other symptoms and signs involving the musculoskeletal system: Secondary | ICD-10-CM

## 2020-10-09 DIAGNOSIS — M6281 Muscle weakness (generalized): Secondary | ICD-10-CM

## 2020-10-09 DIAGNOSIS — R2689 Other abnormalities of gait and mobility: Secondary | ICD-10-CM

## 2020-10-09 DIAGNOSIS — G8111 Spastic hemiplegia affecting right dominant side: Secondary | ICD-10-CM | POA: Diagnosis not present

## 2020-10-09 DIAGNOSIS — I619 Nontraumatic intracerebral hemorrhage, unspecified: Secondary | ICD-10-CM

## 2020-10-09 NOTE — Therapy (Signed)
[facility] [address] [place], [place], [address] Phone: [phone]   Fax:  [phone]  Physical Therapy Treatment and Progress Note  Patient Details  Name: Alison Cuevas MRN: [id number] Date of Birth: [birth year] Referring Provider (PT): Alger Simons MD  Progress Note Reporting Period 09/02/20 to 10/09/20  See note below for Objective Data and Assessment of Progress/Goals.       Encounter Date: 10/09/2020   PT End of Session - 10/09/20 0957    Visit Number 10    Number of Visits 16    Date for PT Re-Evaluation 10/28/20    Authorization Type Medicare A/ [place] NAP 2ndary    Progress Note Due on Visit 20    PT Start Time 0945    PT Stop Time 1030    PT Time Calculation (min) 45 min    Activity Tolerance Patient tolerated treatment well    Behavior During Therapy WFL for tasks assessed/performed           Past Medical History:  Diagnosis Date  . Allergic rhinitis    maple pollen  . Anxiety   . Arthritis   . Bowel obstruction ([place])   . Chronic low back pain    Dr. Trenton Gammon.  Has had back injection--bp elevated after.  . Chronic pain syndrome   . Chronic renal insufficiency, stage 2 (mild)    GFR 60-70  . DDD (degenerative disc disease), cervical    Hx of ACDF (Dr. [name]).  Followed by Dr. [name].  Also, Dr. [name] do left C7-T1 intralaminal epidural injection.  . Diverticulosis of colon   . DJD (degenerative joint disease)   . Gallstones   . GERD (gastroesophageal reflux disease)   . Herpes zoster 07/08/2014  . 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  Resid R arm and leg weakness + flexion contractures.  . Hypercholesteremia    mild; pt declined statin trial 05/2014--needs recheck lipid panel at first f/u visit in 2017  . Hypertension   . Osteopenia 06/2017   T score -1.4 FRAX 15% / 2%  . Ovarian cyst 2017    82019-robotic assisted bilatel SPO: all path benign.  . Perianal dermatitis    prn cutivate  . Phlebitis   . Right hemiplegia ([place]) 01/2020   R arm and leg (hemorrhagic L cerebral hemisphere CVA)  . Right knee injury 2018   Patellofemoral crush injury--Dr. [name].  . Trochanteric bursitis of right hip    Recurrent (injection by Dr. [name] 05/26/15)  . Tympanic membrane rupture, right 12/01/2016   As of 08/2018 pt set for tympanomastoidectomy and STSG (Dr. [name]). Recurrent fungal/bact OE.  . Varicose vein    left leg     Past Surgical History:  Procedure Laterality Date  . ABDOMINAL HYSTERECTOMY  03/2015   TAH.  Last pap 2016.  No hx of abnl paps.  Per GYN, no further pap smears indicated.  . ANTERIOR AND POSTERIOR REPAIR N/A 03/18/2014   Procedure: Cystocele repair with graft, Vault suspension, Rectocele repair;  Surgeon: [name], MD;  [facility];  Service: Urology;  Laterality: N/A;  . CHOLECYSTECTOMY    . CHOLECYSTECTOMY, LAPAROSCOPIC    . COLON SURGERY    . COLONOSCOPY  04/2006; 05/26/16   2007 (Dr. [name]): Normal.  04/2016 (Dr. [name]) normal except diverticulosis and decreased anal sphincter tone.  No repeat colonoscopy is recommended due to age.  . cspine  surgery     Dr. Wiliam Ke level ant cerv discectomy /fusion w/plating  . CYSTO N/A 03/18/2014   Procedure: CYSTO;  Surgeon: [name], MD;  [facility];  Service: Urology;  Laterality: N/A;  . DEXA  07/30/2015; 06/2018   2017 and 2020 -->Osteopenia--repeat 2 yrs.  Marland Kitchen LYSIS OF ADHESION N/A 01/31/2018   Procedure: POSSIBLE LYSIS OF ADHESION;  Surgeon: Everitt Amber, MD;  [facility]: [facility];  Service: Gynecology;  Laterality: N/A;  . [place]     with prolasped bledder repair  . RESECTION OF COLON     BENIGN TUMOR  . RIGHT EAR SURGERY  08/23/2017   Dr. May: right ear canal plasty, tympanoplasty+ ossiculoplasty, and meatal plasty with rotational skin flaps (pre-op dx stenosis of R EAC and  external meatus, with central TM perf)  . right ear surgery  12/12/2018   Right tympanomastoidectomy, ossiculoplasty with partial prosthesis, split thickness skin graft from postauricular skin 1x1cm, and right tragal cartilage graft 1x1 cm [facility])  . right hemicolectomy for diverticulitis with abscess  1993  . ROBOTIC ASSISTED BILATERAL SALPINGO OOPHERECTOMY Bilateral 01/31/2018   All PATH benign.  Procedure: XI ROBOTIC ASSISTED BILATERAL SALPINGO OOPHORECTOMY;  Surgeon: Everitt Amber, MD;  [facility]: [facility];  Service: Gynecology;  Laterality: Bilateral;  . TRANSTHORACIC ECHOCARDIOGRAM  01/27/2020   EF 65-70%, NORMAL.  . TUBAL LIGATION      There were no vitals filed for this visit.   Subjective Assessment - 10/09/20 0950    Subjective Pt reports she is feeling down due to recently losing her husband but otherwise she feels physically well    Pertinent History LT CVA 01/26/20    Limitations Standing;Walking;House hold activities    Pain Location Leg    Pain Orientation Right    Pain Descriptors / Indicators Other (Comment)   stiffness   Pain Onset More than a month ago              [facility] PT Assessment - 10/09/20 0001      Assessment   Medical Diagnosis Right side weakness S/P CVA                         [facility] Adult PT Treatment/Exercise - 10/09/20 0001      Ambulation/Gait   Ambulation/Gait Yes    Ambulation/Gait Assistance 4: Min guard    Ambulation Distance (Feet) 80 Feet   x 3 trials   Assistive device Hemi-walker    Gait Pattern Decreased step length - left;Decreased stance time - right;Decreased stride length;Decreased dorsiflexion - right;Step-to pattern    Ambulation Surface Level;Indoor    Pre-Gait Activities tandem stance 3x30 sec left/right      Knee/Hip Exercises: Standing   Other Standing Knee Exercises staggered stance balance with one foot on step (4" ) - x10 15" holds Bilateral wiht one hand assist      Knee/Hip Exercises: Seated   Long Arc Quad  Strengthening;Both;3 sets;10 reps    Long Arc Quad Limitations 4 lbs RLE, 7 lbs LLE and belt assist to isolate extension    [facility] 3x10                  PT Education - 10/09/20 1117    Education Details discussion regarding STG/LTG    Person(s) Educated Patient    Methods Explanation    Comprehension Verbalized understanding            PT Short Term Goals -  10/09/20 1118      PT SHORT TERM GOAL #1   Title Patient will be independent with initial HEP and self-management strategies to improve functional outcomes    Time 4    Period Weeks    Status On-going    Target Date 10/16/20      PT SHORT TERM GOAL #2   Title Patient will be able to ambulate at least 100 feet during 2MWT with LRAD to demonstrate improved ability to perform functional mobility and associated tasks.    Baseline 80 ft with HW and SBA-CGA level surfaces    Time 4    Period Weeks    Status On-going    Target Date 10/16/20             PT Long Term Goals - 10/09/20 1118      PT LONG TERM GOAL #1   Title Patient will be able to ambulate at least 225 feet during 2MWT with LRAD to demonstrate improved ability to perform functional mobility and associated tasks.    Baseline 80 ft with HW and CGA-SBA    Time 8    Period Weeks    Status On-going    Target Date 10/28/20      PT LONG TERM GOAL #2   Title Patient will have equal to or > 4/5 MMT throughout RLE to improve ability to perform functional mobility, stair ambulation and ADLs.    Baseline RLE 3 to 3+/5    Time 8    Period Weeks    Status On-going    Target Date 10/28/20      PT LONG TERM GOAL #3   Title Patient will be able to maintain semi tandem stance >30 seconds on BLEs to improve stability and reduce risk for falls    Baseline 30 sec with LLE in rear maintained with CGA-SBA, unable to maintain with RLE in rear    Time 8    Period Weeks    Status On-going    Target Date 10/28/20                 Plan - 10/09/20  1121    Clinical Impression Statement Progressing with POC details and demonstrates improved gait pattern and stability with use of HW. Trials of postural correction with shims under LLE to correct leg length discrepancy from scoliosis but this causes increased back pain. Pt would benefit from continued POC to improve RLE strength and facilitate coordination and motor control to enable modified independent gait strategies to increase functional independence    Personal Factors and Comorbidities Age;Comorbidity 1    Comorbidities CVA    Examination-Activity Limitations Locomotion Level;Bed Mobility;Stand;Stairs;Transfers    Examination-Participation Restrictions Community Activity;Yard Work;Laundry;Driving;Cleaning    Stability/Clinical Decision Making Stable/Uncomplicated    Rehab Potential Good    PT Frequency 2x / week    PT Duration 8 weeks    PT Treatment/Interventions ADLs/Self Care Home Management;Aquatic Therapy;Iontophoresis 4mg /ml Dexamethasone;DME Instruction;Balance training;Passive range of motion;Scar mobilization;Neuromuscular re-education;Gait training;Stair training;Functional mobility training;Patient/family education;Manual techniques;Dry needling;Energy conservation;Manual lymph drainage;Parrafin;Ultrasound;Therapeutic activities;Canalith Repostioning;Moist Heat;Traction;Biofeedback;Cryotherapy;Fluidtherapy;Electrical Stimulation;Contrast Bath;Therapeutic exercise;Orthotic Fit/Training;Compression bandaging;Splinting;Taping;Vasopneumatic Device;Joint Manipulations;Spinal Manipulations;Visual/perceptual remediation/compensation;Vestibular    PT Next Visit Plan PN next session. Continue standing LE strength, static balance and gait. Progress gait and balance as able.    PT Home Exercise Plan Eval: seated march, heel/toe raise, LAQs; 09/17/20: STS, standing abduction and marching wiht UE support counter 3/30 staggered stance with support    Consulted and Agree with Plan of Care Patient  Patient will benefit from skilled therapeutic intervention in order to improve the following deficits and impairments:  Pain,Improper body mechanics,Abnormal gait,Decreased balance,Decreased activity tolerance,Decreased range of motion,Decreased strength,Hypomobility,Decreased mobility,Impaired sensation,Difficulty walking  Visit Diagnosis: Other lack of coordination  Other symptoms and signs involving the musculoskeletal system  Muscle weakness (generalized)  Other abnormalities of gait and mobility  Intraparenchymal hemorrhage of brain [facility])     Problem List Patient Active Problem List   Diagnosis Date Noted  . Pain in joint of right foot 04/15/2020  . Right spastic hemiparesis ([place]) 03/04/2020  . Chronic otitis externa of right ear 02/20/2020  . Intraparenchymal hemorrhage of brain ([place])   . Delirium   . 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 Stroke (cerebrum) ([place]) 01/26/2020  . Conductive hearing loss of right ear with unrestricted hearing of left ear 05/28/2019  . Hyponatremia 08/31/2017  . Generalized weakness 08/31/2017  . Dehydration 08/31/2017  . UTI (urinary tract infection) 08/31/2017  . Tympanic membrane rupture, right 12/01/2016  . Right ovarian cyst 08/22/2016  . Fibromyalgia 06/03/2015  . Cystocele 03/18/2014  . 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 Diverticulosis of colon without hemorrhage 06/12/2013  . Hypertension   . Osteopenia   . Chronic low back pain 05/12/2011  . HYPERCHOLESTEROLEMIA 03/17/2008  . Anxiety state 09/17/2007  . GERD 09/17/2007  . DJD (degenerative joint disease) 09/17/2007   11:26 AM, 10/09/20 M. Sherlyn Lees, PT, DPT Physical Therapist- [place] Office Number: [phone]  [facility] [address] [place], [place], [address] Phone: [phone]   Fax:  [phone]  Name: Drucella Karbowski MRN: [id number] Date of Birth:  1946-04-17

## 2020-10-12 ENCOUNTER — Ambulatory Visit (HOSPITAL_COMMUNITY): Payer: Medicare Other | Admitting: Physical Therapy

## 2020-10-12 ENCOUNTER — Encounter (HOSPITAL_COMMUNITY): Payer: Medicare Other

## 2020-10-13 ENCOUNTER — Other Ambulatory Visit: Payer: Self-pay

## 2020-10-13 ENCOUNTER — Encounter (HOSPITAL_COMMUNITY): Payer: Self-pay

## 2020-10-13 ENCOUNTER — Ambulatory Visit (HOSPITAL_COMMUNITY): Payer: Medicare Other

## 2020-10-13 ENCOUNTER — Encounter (HOSPITAL_COMMUNITY): Payer: Self-pay | Admitting: Occupational Therapy

## 2020-10-13 ENCOUNTER — Ambulatory Visit (HOSPITAL_COMMUNITY): Payer: Medicare Other | Admitting: Occupational Therapy

## 2020-10-13 DIAGNOSIS — R278 Other lack of coordination: Secondary | ICD-10-CM

## 2020-10-13 DIAGNOSIS — M6281 Muscle weakness (generalized): Secondary | ICD-10-CM

## 2020-10-13 DIAGNOSIS — R29898 Other symptoms and signs involving the musculoskeletal system: Secondary | ICD-10-CM

## 2020-10-13 DIAGNOSIS — I619 Nontraumatic intracerebral hemorrhage, unspecified: Secondary | ICD-10-CM

## 2020-10-13 DIAGNOSIS — G8111 Spastic hemiplegia affecting right dominant side: Secondary | ICD-10-CM | POA: Diagnosis not present

## 2020-10-13 DIAGNOSIS — R2689 Other abnormalities of gait and mobility: Secondary | ICD-10-CM

## 2020-10-13 NOTE — Therapy (Signed)
[facility] [address] [place], [place], [address] Phone: [phone]   Fax:  [phone]  Physical Therapy Treatment  Patient Details  Name: Alison Cuevas MRN: [id number] Date of Birth: 18-Oct-1945 Referring Provider (PT): Alger Simons MD   Encounter Date: 10/13/2020   PT End of Session - 10/13/20 1557    Visit Number 11    Number of Visits 16    Date for PT Re-Evaluation 10/28/20    Authorization Type Medicare A/ [place] NAP 2ndary    Progress Note Due on Visit 20    PT Start Time 1600    PT Stop Time 1645    PT Time Calculation (min) 45 min    Activity Tolerance Patient tolerated treatment well    Behavior During Therapy [facility] for tasks assessed/performed           Past Medical History:  Diagnosis Date  . Allergic rhinitis    maple pollen  . Anxiety   . Arthritis   . Bowel obstruction ([place])   . Chronic low back pain    Dr. Trenton Gammon.  Has had back injection--bp elevated after.  . Chronic pain syndrome   . Chronic renal insufficiency, stage 2 (mild)    GFR 60-70  . DDD (degenerative disc disease), cervical    Hx of ACDF (Dr. [name]).  Followed by Dr. [name].  Also, Dr. [name] do left C7-T1 intralaminal epidural injection.  . Diverticulosis of colon   . DJD (degenerative joint disease)   . Gallstones   . GERD (gastroesophageal reflux disease)   . Herpes zoster 07/08/2014  . 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  Resid R arm and leg weakness + flexion contractures.  . Hypercholesteremia    mild; pt declined statin trial 05/2014--needs recheck lipid panel at first f/u visit in 2017  . Hypertension   . Osteopenia 06/2017   T score -1.4 FRAX 15% / 2%  . Ovarian cyst 2017   82019-robotic assisted bilatel SPO: all path benign.  . Perianal dermatitis    prn cutivate  . Phlebitis   . Right hemiplegia ([place]) 01/2020   R arm and leg  (hemorrhagic L cerebral hemisphere CVA)  . Right knee injury 2018   Patellofemoral crush injury--Dr. [name].  . Trochanteric bursitis of right hip    Recurrent (injection by Dr. [name] 05/26/15)  . Tympanic membrane rupture, right 12/01/2016   As of 08/2018 pt set for tympanomastoidectomy and STSG (Dr. [name]). Recurrent fungal/bact OE.  . Varicose vein    left leg     Past Surgical History:  Procedure Laterality Date  . ABDOMINAL HYSTERECTOMY  03/2015   TAH.  Last pap 2016.  No hx of abnl paps.  Per GYN, no further pap smears indicated.  . ANTERIOR AND POSTERIOR REPAIR N/A 03/18/2014   Procedure: Cystocele repair with graft, Vault suspension, Rectocele repair;  Surgeon: [name], MD;  [facility]: [facility];  Service: Urology;  Laterality: N/A;  . CHOLECYSTECTOMY    . CHOLECYSTECTOMY, LAPAROSCOPIC    . COLON SURGERY    . COLONOSCOPY  04/2006; 05/26/16   2007 (Dr. [name]): Normal.  04/2016 (Dr. [name]) normal except diverticulosis and decreased anal sphincter tone.  No repeat colonoscopy is recommended due to age.  . cspine surgery     Dr. Wiliam Ke level ant cerv discectomy /fusion w/plating  . CYSTO N/A 03/18/2014   Procedure: CYSTO;  Surgeon: Elayne Snare  MacDiarmid, MD;  Location: [facility];  Service: Urology;  Laterality: N/A;  . DEXA  07/30/2015; 06/2018   2017 and 2020 -->Osteopenia--repeat 2 yrs.  Marland Kitchen LYSIS OF ADHESION N/A 01/31/2018   Procedure: POSSIBLE LYSIS OF ADHESION;  Surgeon: Everitt Amber, MD;  [facility]: [facility];  Service: Gynecology;  Laterality: N/A;  . [place]     with prolasped bledder repair  . RESECTION OF COLON     BENIGN TUMOR  . RIGHT EAR SURGERY  08/23/2017   Dr. 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 right ear surgery  12/12/2018   Right tympanomastoidectomy, ossiculoplasty with partial prosthesis, split thickness  skin graft from postauricular skin 1x1cm, and right tragal cartilage graft 1x1 cm [facility])  . right hemicolectomy for diverticulitis with abscess  1993  . ROBOTIC ASSISTED BILATERAL SALPINGO OOPHERECTOMY Bilateral 01/31/2018   All PATH benign.  Procedure: XI ROBOTIC ASSISTED BILATERAL SALPINGO OOPHORECTOMY;  Surgeon: Everitt Amber, MD;  [facility];  Service: Gynecology;  Laterality: Bilateral;  . TRANSTHORACIC ECHOCARDIOGRAM  01/27/2020   EF 65-70%, NORMAL.  . TUBAL LIGATION      There were no vitals filed for this visit.   Subjective Assessment - 10/13/20 1602    Subjective Pt reports she is feeling tired and run down and hasn't been able to walk as much lately                             [facility] Adult PT Treatment/Exercise - 10/13/20 1615      Ambulation/Gait   Ambulation/Gait Yes    Ambulation/Gait Assistance 4: Min guard    Ambulation Distance (Feet) 150 Feet    Assistive device Hemi-walker    Gait Pattern Decreased step length - left;Decreased stance time - right;Decreased stride length;Decreased dorsiflexion - right;Step-to pattern    Ambulation Surface Level;Indoor    Pre-Gait Activities single limb support activity with LLE elevated on 4" and 6" step alternating for weight shifting 2x10 reps    Gait Comments imrpoved step length with verbal cues for "big step on left"      Knee/Hip Exercises: Stretches   Passive Hamstring Stretch Right;3 reps;60 seconds    Other Knee/Hip Stretches right hip adductor stretch 3 x 60 sec to decrease tone/spasticity   increased tone with knee extended vs flexed     Knee/Hip Exercises: Supine   Short Arc Quad Sets Strengthening;Both;3 sets;15 reps    Short Arc Quad Sets Limitations 5 lbs    Bridges Strengthening;Both;3 sets;10 reps    Other Supine Knee/Hip Exercises ball squeeze 3x10 2 sec hold    Other Supine Knee/Hip Exercises hip abduction/adduction RLE 3x10 with manual assist to minimize flexion tone                   PT Education - 10/13/20 1651    Education Details discussion and education on LE spasticity    Person(s) Educated Patient    Methods Explanation    Comprehension Verbalized understanding            PT Short Term Goals - 10/09/20 1118      PT SHORT TERM GOAL #1   Title Patient will be independent with initial HEP and self-management strategies to improve functional outcomes    Time 4    Period Weeks    Status On-going  Target Date 10/16/20      PT SHORT TERM GOAL #2   Title Patient will be able to ambulate at least 100 feet during 2MWT with LRAD to demonstrate improved ability to perform functional mobility and associated tasks.    Baseline 80 ft with HW and SBA-CGA level surfaces    Time 4    Period Weeks    Status On-going    Target Date 10/16/20             PT Long Term Goals - 10/09/20 1118      PT LONG TERM GOAL #1   Title Patient will be able to ambulate at least 225 feet during 2MWT with LRAD to demonstrate improved ability to perform functional mobility and associated tasks.    Baseline 80 ft with HW and CGA-SBA    Time 8    Period Weeks    Status On-going    Target Date 10/28/20      PT LONG TERM GOAL #2   Title Patient will have equal to or > 4/5 MMT throughout RLE to improve ability to perform functional mobility, stair ambulation and ADLs.    Baseline RLE 3 to 3+/5    Time 8    Period Weeks    Status On-going    Target Date 10/28/20      PT LONG TERM GOAL #3   Title Patient will be able to maintain semi tandem stance >30 seconds on BLEs to improve stability and reduce risk for falls    Baseline 30 sec with LLE in rear maintained with CGA-SBA, unable to maintain with RLE in rear    Time 8    Period Weeks    Status On-going    Target Date 10/28/20                 Plan - 10/13/20 1651    Clinical Impression Statement demonstrating improved gait performance with HW and verbal cues for increased step length. Continues to  exhibit right adductor (thigh) spasticity with propensity for hip drop/adduction with swing phase RLE. Good recruitment of hip abductor/external rotation. Continued POC indicated to improve RLE strength and facilitate normalized gait pattern    Personal Factors and Comorbidities Age;Comorbidity 1    Comorbidities CVA    Examination-Activity Limitations Locomotion Level;Bed Mobility;Stand;Stairs;Transfers    Examination-Participation Restrictions Community Activity;Yard Work;Laundry;Driving;Cleaning    Stability/Clinical Decision Making Stable/Uncomplicated    Rehab Potential Good    PT Frequency 2x / week    PT Duration 8 weeks    PT Treatment/Interventions ADLs/Self Care Home Management;Aquatic Therapy;Iontophoresis 4mg /ml Dexamethasone;DME Instruction;Balance training;Passive range of motion;Scar mobilization;Neuromuscular re-education;Gait training;Stair training;Functional mobility training;Patient/family education;Manual techniques;Dry needling;Energy conservation;Manual lymph drainage;Parrafin;Ultrasound;Therapeutic activities;Canalith Repostioning;Moist Heat;Traction;Biofeedback;Cryotherapy;Fluidtherapy;Electrical Stimulation;Contrast Bath;Therapeutic exercise;Orthotic Fit/Training;Compression bandaging;Splinting;Taping;Vasopneumatic Device;Joint Manipulations;Spinal Manipulations;Visual/perceptual remediation/compensation;Vestibular    PT Next Visit Plan PN next session. Continue standing LE strength, static balance and gait. Progress gait and balance as able.    PT Home Exercise Plan Eval: seated march, heel/toe raise, LAQs; 09/17/20: STS, standing abduction and marching wiht UE support counter 3/30 staggered stance with support    Consulted and Agree with Plan of Care Patient           Patient will benefit from skilled therapeutic intervention in order to improve the following deficits and impairments:  Pain,Improper body mechanics,Abnormal gait,Decreased balance,Decreased activity  tolerance,Decreased range of motion,Decreased strength,Hypomobility,Decreased mobility,Impaired sensation,Difficulty walking  Visit Diagnosis: Other lack of coordination  Other symptoms and signs involving the musculoskeletal system  Muscle weakness (generalized)  Other abnormalities of gait and mobility  Intraparenchymal hemorrhage of brain [facility])     Problem List Patient Active Problem List   Diagnosis Date Noted  . Pain in joint of right foot 04/15/2020  . Right spastic hemiparesis ([place]) 03/04/2020  . Chronic otitis externa of right ear 02/20/2020  . Intraparenchymal hemorrhage of brain ([place])   . Delirium   . 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 Stroke (cerebrum) ([place]) 01/26/2020  . Conductive hearing loss of right ear with unrestricted hearing of left ear 05/28/2019  . Hyponatremia 08/31/2017  . Generalized weakness 08/31/2017  . Dehydration 08/31/2017  . UTI (urinary tract infection) 08/31/2017  . Tympanic membrane rupture, right 12/01/2016  . Right ovarian cyst 08/22/2016  . Fibromyalgia 06/03/2015  . Cystocele 03/18/2014  . 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 Diverticulosis of colon without hemorrhage 06/12/2013  . Hypertension   . Osteopenia   . Chronic low back pain 05/12/2011  . HYPERCHOLESTEROLEMIA 03/17/2008  . Anxiety state 09/17/2007  . GERD 09/17/2007  . DJD (degenerative joint disease) 09/17/2007   4:53 PM, 10/13/20 M. Sherlyn Lees, PT, DPT Physical Therapist- [place] Office Number: [phone]  [facility] [address] [place], [place], [address] Phone: [phone]   Fax:  [phone]  Name: Antara Brecheisen MRN: [id number] Date of Birth: 30-Apr-1946

## 2020-10-13 NOTE — Therapy (Signed)
[facility] [place], [place], [address] Phone: [phone]   Fax:  [phone]  Occupational Therapy Treatment  Patient Details  Name: Alison Cuevas MRN: [id number] Date of Birth: 04-29-1946 Referring Provider (OT): Alger Simons, MD   Encounter Date: 10/13/2020   OT End of Session - 10/13/20 1553    Visit Number 9    Number of Visits 12    Date for OT Re-Evaluation 10/14/20    Authorization Type double coverage Medicare 80/20% secondary: [employer] Covers what medicare doesn't.    Progress Note Due on Visit 10    OT Start Time 1515    OT Stop Time 1555    OT Time Calculation (min) 40 min    Activity Tolerance Patient tolerated treatment well    Behavior During Therapy WFL for tasks assessed/performed           Past Medical History:  Diagnosis Date  . Allergic rhinitis    maple pollen  . Anxiety   . Arthritis   . Bowel obstruction ([place])   . Chronic low back pain    Dr. Trenton Gammon.  Has had back injection--bp elevated after.  . Chronic pain syndrome   . Chronic renal insufficiency, stage 2 (mild)    GFR 60-70  . DDD (degenerative disc disease), cervical    Hx of ACDF (Dr. [name]).  Followed by Dr. [name].  Also, Dr. [name] do left C7-T1 intralaminal epidural injection.  . Diverticulosis of colon   . DJD (degenerative joint disease)   . Gallstones   . GERD (gastroesophageal reflux disease)   . Herpes zoster 07/08/2014  . 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  Resid R arm and leg weakness + flexion contractures.  . Hypercholesteremia    mild; pt declined statin trial 05/2014--needs recheck lipid panel at first f/u visit in 2017  . Hypertension   . Osteopenia 06/2017   T score -1.4 FRAX 15% / 2%  . Ovarian cyst 2017   82019-robotic assisted bilatel SPO: all path benign.  . Perianal dermatitis    prn cutivate  .  Phlebitis   . Right hemiplegia ([place]) 01/2020   R arm and leg (hemorrhagic L cerebral hemisphere CVA)  . Right knee injury 2018   Patellofemoral crush injury--Dr. [name].  . Trochanteric bursitis of right hip    Recurrent (injection by Dr. [name] 05/26/15)  . Tympanic membrane rupture, right 12/01/2016   As of 08/2018 pt set for tympanomastoidectomy and STSG (Dr. [name]). Recurrent fungal/bact OE.  . Varicose vein    left leg     Past Surgical History:  Procedure Laterality Date  . ABDOMINAL HYSTERECTOMY  03/2015   TAH.  Last pap 2016.  No hx of abnl paps.  Per GYN, no further pap smears indicated.  . ANTERIOR AND POSTERIOR REPAIR N/A 03/18/2014   Procedure: Cystocele repair with graft, Vault suspension, Rectocele repair;  Surgeon: [name], MD;  [facility];  Service: Urology;  Laterality: N/A;  . CHOLECYSTECTOMY    . CHOLECYSTECTOMY, LAPAROSCOPIC    . COLON SURGERY    . COLONOSCOPY  04/2006; 05/26/16   2007 (Dr. [name]): Normal.  04/2016 (Dr. [name]) normal except diverticulosis and decreased anal sphincter tone.  No repeat colonoscopy is recommended due to age.  . cspine surgery     Dr. Wiliam Ke level ant cerv discectomy /fusion w/plating  . CYSTO N/A 03/18/2014   Procedure:  CYSTO;  Surgeon: [name], MD;  [facility];  Service: Urology;  Laterality: N/A;  . DEXA  07/30/2015; 06/2018   2017 and 2020 -->Osteopenia--repeat 2 yrs.  Marland Kitchen LYSIS OF ADHESION N/A 01/31/2018   Procedure: POSSIBLE LYSIS OF ADHESION;  Surgeon: Everitt Amber, MD;  [facility];  Service: Gynecology;  Laterality: N/A;  . [place]     with prolasped bledder repair  . RESECTION OF COLON     BENIGN TUMOR  . RIGHT EAR SURGERY  08/23/2017   Dr. 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 right ear surgery  12/12/2018   Right  tympanomastoidectomy, ossiculoplasty with partial prosthesis, split thickness skin graft from postauricular skin 1x1cm, and right tragal cartilage graft 1x1 cm [facility])  . right hemicolectomy for diverticulitis with abscess  1993  . ROBOTIC ASSISTED BILATERAL SALPINGO OOPHERECTOMY Bilateral 01/31/2018   All PATH benign.  Procedure: XI ROBOTIC ASSISTED BILATERAL SALPINGO OOPHORECTOMY;  Surgeon: Everitt Amber, MD;  [facility];  Service: Gynecology;  Laterality: Bilateral;  . TRANSTHORACIC ECHOCARDIOGRAM  01/27/2020   EF 65-70%, NORMAL.  . TUBAL LIGATION      There were no vitals filed for this visit.   Subjective Assessment - 10/13/20 1515    Subjective  S: I'm hanging in there.    Currently in Pain? Yes    Pain Score 3     Pain Location Arm   leg   Pain Orientation Right    Pain Descriptors / Indicators Aching    Pain Type Chronic pain    Pain Radiating Towards N/A    Pain Onset More than a month ago    Pain Frequency Constant    Aggravating Factors  from use    Pain Relieving Factors pain medication, rest    Effect of Pain on Daily Activities mod effect on ADLs    Multiple Pain Sites No              [facility] OT Assessment - 10/13/20 1515      Assessment   Medical Diagnosis Right side weakness S/P CVA      Precautions   Precautions Fall    Required Braces or Orthoses Other Brace/Splint    Other Brace/Splint Right AFO                    OT Treatments/Exercises (OP) - 10/13/20 1518      Exercises   Exercises Hand      Hand Exercises   Hand Gripper with Large Beads all beads gripper at 29#, horizontal    Hand Gripper with Medium Beads all beads gripper at 29#, horizontal      Neurological Re-education Exercises   Shoulder ABduction Strengthening;10 reps   1#   Shoulder Protraction Strengthening;10 reps   1#   Shoulder Horizontal ABduction Strengthening;10 reps   1#   Shoulder External Rotation Strengthening;10 reps   1#   Shoulder Internal Rotation  Strengthening;10 reps   1#   Other Exercises 1 proximal shoulder strengthening seated, 10X each, no rest breaks    Other Exercises 2 ABC writing holding 1# weight at 90 degrees flexion      Manual Therapy   Manual Therapy Myofascial release    Manual therapy comments completed separately from therapeutic exercises    Myofascial Release myofascial release to right dorsal forearm at lateral epicondyle  region to decrease pain and fascial restrictions and improve RUE mobility      Fine Motor Coordination (Hand/Wrist)   Fine Motor Coordination Flipping cards;Manipulating coins    Flipping cards Pt holding card in fingers, rotating 2x then flipping 2x. Completed for 5 cards, increased time required    Manipulating coins Pt working on palm to fingertip translation bringing coins to fingers to place into slotted container. Occasionally dropping coins, increased time for task.                    OT Short Term Goals - 09/11/20 0941      OT SHORT TERM GOAL #1   Title Pt will be educated and independent with HEP in order to faciliate progress in therapy and work towards increasing her RUE strength and coordination while increasing her functional performance during ADL tasks.    Time 3    Period Weeks    Status On-going    Target Date 09/23/20             OT Long Term Goals - 09/11/20 0941      OT LONG TERM GOAL #1   Title Patient will increase right shoulder strength and elbow to 5/5 while demonstrating increased shoulder and scapular stability when reaching away from her body/base of support.    Time 6    Period Weeks    Status On-going      OT LONG TERM GOAL #2   Title Patient will increase right hand coordination by completing the 9 hole peg test in 30 seconds or less in order to roll hair with less difficulty.    Time 6    Period Weeks    Status On-going      OT LONG TERM GOAL #3   Title Patient will increase right hand grip strength by 10# and pinch strength by 5# in  order to increase ability to grasp and manipulate items with less difficulty.    Time 6    Period Weeks    Status On-going      OT LONG TERM GOAL #4   Title patient will reach highest level of independence with increased confidence and safety awareness while completing all ADL tasks at modified independent level or better while utilizing adaptive equipment and/or compensatory strategies if needed.    Baseline 3/9: Pt is sponge bathing and does not take a shower unless someone is present for supervision.    Time 6    Period Weeks    Status On-going                 Plan - 10/13/20 1542    Clinical Impression Statement A: Pt reports pain in forearm today, myofascial release completed to lateral epicondyle region where restrictions were palpated with good results regarding pain reduction. Continued with shoulder strengthening adding 1# weight to exercises. Did not complete elbow exercises to prevent aggravating pain. Continued with hand gripper tasks, pt with mod fatigue at end of task. Added card spin/flip task for fine motor coordination and continued with coin manipulation. Verbal cuing for form and technique.    Body Structure / Function / Physical Skills ADL;UE functional use;FMC;ROM;Proprioception;Coordination;GMC;Strength    Plan P: Reassess and re-cert, progress note    OT Home Exercise Plan Eval: proximal shoulder strengthening; 3/18: shoulder A/ROM 3/21: yellow putty - pinch and grip; 3/24: shoulder strengthening (provided A/ROM HEP again)    Consulted and Agree with Plan of Care Patient  Patient will benefit from skilled therapeutic intervention in order to improve the following deficits and impairments:   Body Structure / Function / Physical Skills: ADL,UE functional use,FMC,ROM,Proprioception,Coordination,GMC,Strength       Visit Diagnosis: Other lack of coordination  Other symptoms and signs involving the musculoskeletal system    Problem List Patient  Active Problem List   Diagnosis Date Noted  . Pain in joint of right foot 04/15/2020  . Right spastic hemiparesis ([place]) 03/04/2020  . Chronic otitis externa of right ear 02/20/2020  . Intraparenchymal hemorrhage of brain ([place])   . Delirium   . 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 Stroke (cerebrum) ([place]) 01/26/2020  . Conductive hearing loss of right ear with unrestricted hearing of left ear 05/28/2019  . Hyponatremia 08/31/2017  . Generalized weakness 08/31/2017  . Dehydration 08/31/2017  . UTI (urinary tract infection) 08/31/2017  . Tympanic membrane rupture, right 12/01/2016  . Right ovarian cyst 08/22/2016  . Fibromyalgia 06/03/2015  . Cystocele 03/18/2014  . 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 Diverticulosis of colon without hemorrhage 06/12/2013  . Hypertension   . Osteopenia   . Chronic low back pain 05/12/2011  . HYPERCHOLESTEROLEMIA 03/17/2008  . Anxiety state 09/17/2007  . GERD 09/17/2007  . DJD (degenerative joint disease) 09/17/2007   Guadelupe Sabin, OTR/L  [phone] 10/13/2020, 3:59 PM  [facility] [address] [place], [place], [address] Phone: [phone]   Fax:  [phone]  Name: Alison Cuevas MRN: [id number] Date of Birth: 07-23-1945

## 2020-10-14 ENCOUNTER — Encounter: Payer: Medicare Other | Attending: Physical Medicine & Rehabilitation | Admitting: Physical Medicine & Rehabilitation

## 2020-10-14 ENCOUNTER — Encounter: Payer: Self-pay | Admitting: Physical Medicine & Rehabilitation

## 2020-10-14 ENCOUNTER — Encounter (HOSPITAL_COMMUNITY): Payer: Self-pay

## 2020-10-14 ENCOUNTER — Other Ambulatory Visit: Payer: Self-pay

## 2020-10-14 ENCOUNTER — Ambulatory Visit (HOSPITAL_COMMUNITY): Payer: Medicare Other | Admitting: Physical Therapy

## 2020-10-14 ENCOUNTER — Ambulatory Visit (HOSPITAL_COMMUNITY): Payer: Medicare Other

## 2020-10-14 ENCOUNTER — Encounter (HOSPITAL_COMMUNITY): Payer: Self-pay | Admitting: Physical Therapy

## 2020-10-14 VITALS — BP 146/80 | HR 79 | Temp 98.4°F | Ht 60.0 in | Wt 116.0 lb

## 2020-10-14 DIAGNOSIS — G8111 Spastic hemiplegia affecting right dominant side: Secondary | ICD-10-CM | POA: Diagnosis not present

## 2020-10-14 DIAGNOSIS — R29898 Other symptoms and signs involving the musculoskeletal system: Secondary | ICD-10-CM

## 2020-10-14 DIAGNOSIS — R2689 Other abnormalities of gait and mobility: Secondary | ICD-10-CM

## 2020-10-14 DIAGNOSIS — R278 Other lack of coordination: Secondary | ICD-10-CM

## 2020-10-14 DIAGNOSIS — M6281 Muscle weakness (generalized): Secondary | ICD-10-CM

## 2020-10-14 NOTE — Therapy (Signed)
[place], [place], [address] Phone: [phone]   Fax:  [phone]  Physical Therapy Treatment  Patient Details  Name: Alison Cuevas MRN: [id number] Date of Birth: 1946-05-05 Referring Provider (PT): Alger Simons MD   Encounter Date: 10/14/2020   PT End of Session - 10/14/20 1318    Visit Number 12    Number of Visits 16    Date for PT Re-Evaluation 10/28/20    Authorization Type Medicare A/ [place] NAP 2ndary    Progress Note Due on Visit 20    PT Start Time 1318    PT Stop Time 1401    PT Time Calculation (min) 43 min    Activity Tolerance Patient tolerated treatment well    Behavior During Therapy [facility] for tasks assessed/performed           Past Medical History:  Diagnosis Date  . Allergic rhinitis    maple pollen  . Anxiety   . Arthritis   . Bowel obstruction ([place])   . Chronic low back pain    Dr. Trenton Gammon.  Has had back injection--bp elevated after.  . Chronic pain syndrome   . Chronic renal insufficiency, stage 2 (mild)    GFR 60-70  . DDD (degenerative disc disease), cervical    Hx of ACDF (Dr. [name]).  Followed by Dr. [name].  Also, Dr. [name] do left C7-T1 intralaminal epidural injection.  . Diverticulosis of colon   . DJD (degenerative joint disease)   . Gallstones   . GERD (gastroesophageal reflux disease)   . Herpes zoster 07/08/2014  . 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  Resid R arm and leg weakness + flexion contractures.  . Hypercholesteremia    mild; pt declined statin trial 05/2014--needs recheck lipid panel at first f/u visit in 2017  . Hypertension   . Osteopenia 06/2017   T score -1.4 FRAX 15% / 2%  . Ovarian cyst 2017   82019-robotic assisted bilatel SPO: all path benign.  . Perianal dermatitis    prn cutivate  . Phlebitis   . Right hemiplegia ([place]) 01/2020   R arm and leg  (hemorrhagic L cerebral hemisphere CVA)  . Right knee injury 2018   Patellofemoral crush injury--Dr. [name].  . Trochanteric bursitis of right hip    Recurrent (injection by Dr. [name] 05/26/15)  . Tympanic membrane rupture, right 12/01/2016   As of 08/2018 pt set for tympanomastoidectomy and STSG (Dr. [name]). Recurrent fungal/bact OE.  . Varicose vein    left leg     Past Surgical History:  Procedure Laterality Date  . ABDOMINAL HYSTERECTOMY  03/2015   TAH.  Last pap 2016.  No hx of abnl paps.  Per GYN, no further pap smears indicated.  . ANTERIOR AND POSTERIOR REPAIR N/A 03/18/2014   Procedure: Cystocele repair with graft, Vault suspension, Rectocele repair;  Surgeon: [name], MD;  [facility]: [facility];  Service: Urology;  Laterality: N/A;  . CHOLECYSTECTOMY    . CHOLECYSTECTOMY, LAPAROSCOPIC    . COLON SURGERY    . COLONOSCOPY  04/2006; 05/26/16   2007 (Dr. [name]): Normal.  04/2016 (Dr. [name]) normal except diverticulosis and decreased anal sphincter tone.  No repeat colonoscopy is recommended due to age.  . cspine surgery     Dr. Wiliam Ke level ant cerv discectomy /fusion w/plating  . CYSTO N/A 03/18/2014   Procedure: CYSTO;  Surgeon: Elayne Snare  MacDiarmid, MD;  Location: [facility];  Service: Urology;  Laterality: N/A;  . DEXA  07/30/2015; 06/2018   2017 and 2020 -->Osteopenia--repeat 2 yrs.  Marland Kitchen LYSIS OF ADHESION N/A 01/31/2018   Procedure: POSSIBLE LYSIS OF ADHESION;  Surgeon: Everitt Amber, MD;  [facility];  Service: Gynecology;  Laterality: N/A;  . [place]     with prolasped bledder repair  . RESECTION OF COLON     BENIGN TUMOR  . RIGHT EAR SURGERY  08/23/2017   Dr. 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 right ear surgery  12/12/2018   Right tympanomastoidectomy, ossiculoplasty with partial prosthesis, split thickness  skin graft from postauricular skin 1x1cm, and right tragal cartilage graft 1x1 cm [facility])  . right hemicolectomy for diverticulitis with abscess  1993  . ROBOTIC ASSISTED BILATERAL SALPINGO OOPHERECTOMY Bilateral 01/31/2018   All PATH benign.  Procedure: XI ROBOTIC ASSISTED BILATERAL SALPINGO OOPHORECTOMY;  Surgeon: Everitt Amber, MD;  [facility];  Service: Gynecology;  Laterality: Bilateral;  . TRANSTHORACIC ECHOCARDIOGRAM  01/27/2020   EF 65-70%, NORMAL.  . TUBAL LIGATION      There were no vitals filed for this visit.   Subjective Assessment - 10/14/20 1320    Subjective Patient states she has been down since her husband past last week. Her MD did injection into her adductors today. She goes back for more botox in july.    Currently in Pain? No/denies                             [facility] Adult PT Treatment/Exercise - 10/14/20 0001      Ambulation/Gait   Ambulation/Gait Yes    Ambulation/Gait Assistance 4: Min guard    Ambulation Distance (Feet) 226 Feet    Assistive device Hemi-walker    Gait Pattern Decreased step length - left;Decreased stance time - right;Decreased stride length;Decreased dorsiflexion - right;Step-to pattern    Ambulation Surface Level;Indoor      Knee/Hip Exercises: Standing   Hip Flexion Both;2 sets;10 reps    Lateral Step Up Both;1 set;10 reps;Hand Hold: 2;Step Height: 4"    Forward Step Up Both;10 reps;Hand Hold: 2;Step Height: 4";2 sets    Other Standing Knee Exercises NBOS with cross body reaches 20x bilateral      Knee/Hip Exercises: Supine   Bridges Strengthening;Both;3 sets;10 reps      Knee/Hip Exercises: Sidelying   Clams 2x 10                  PT Education - 10/14/20 1320    Education Details HEP, exercise mechanics    Person(s) Educated Patient    Methods Explanation;Demonstration    Comprehension Verbalized understanding;Returned demonstration            PT Short Term Goals - 10/09/20 1118      PT SHORT  TERM GOAL #1   Title Patient will be independent with initial HEP and self-management strategies to improve functional outcomes    Time 4    Period Weeks    Status On-going    Target Date 10/16/20      PT SHORT TERM GOAL #2   Title Patient will be able to ambulate at least 100 feet during 2MWT with LRAD to demonstrate improved ability to perform functional mobility and associated tasks.    Baseline  73 ft with HW and SBA-CGA level surfaces    Time 4    Period Weeks    Status On-going    Target Date 10/16/20             PT Long Term Goals - 10/09/20 1118      PT LONG TERM GOAL #1   Title Patient will be able to ambulate at least 225 feet during 2MWT with LRAD to demonstrate improved ability to perform functional mobility and associated tasks.    Baseline 80 ft with HW and CGA-SBA    Time 8    Period Weeks    Status On-going    Target Date 10/28/20      PT LONG TERM GOAL #2   Title Patient will have equal to or > 4/5 MMT throughout RLE to improve ability to perform functional mobility, stair ambulation and ADLs.    Baseline RLE 3 to 3+/5    Time 8    Period Weeks    Status On-going    Target Date 10/28/20      PT LONG TERM GOAL #3   Title Patient will be able to maintain semi tandem stance >30 seconds on BLEs to improve stability and reduce risk for falls    Baseline 30 sec with LLE in rear maintained with CGA-SBA, unable to maintain with RLE in rear    Time 8    Period Weeks    Status On-going    Target Date 10/28/20                 Plan - 10/14/20 1318    Clinical Impression Statement Patient showing improving activity tolerance with increased ambulation distance without rest break today. She is showing good gait mechanics with hemi walker and she states her sister is going to get her one to use at home. Patient completes mat exercises for improved hip strengthening as she wants additional exercises for HEP. Patient requires guard assist for all standing  exercises for safety/balance. Patient will continue to benefit from skilled physical therapy in order to reduce impairment and improve function.    Personal Factors and Comorbidities Age;Comorbidity 1    Comorbidities CVA    Examination-Activity Limitations Locomotion Level;Bed Mobility;Stand;Stairs;Transfers    Examination-Participation Restrictions Community Activity;Yard Work;Laundry;Driving;Cleaning    Stability/Clinical Decision Making Stable/Uncomplicated    Rehab Potential Good    PT Frequency 2x / week    PT Duration 8 weeks    PT Treatment/Interventions ADLs/Self Care Home Management;Aquatic Therapy;Iontophoresis 4mg /ml Dexamethasone;DME Instruction;Balance training;Passive range of motion;Scar mobilization;Neuromuscular re-education;Gait training;Stair training;Functional mobility training;Patient/family education;Manual techniques;Dry needling;Energy conservation;Manual lymph drainage;Parrafin;Ultrasound;Therapeutic activities;Canalith Repostioning;Moist Heat;Traction;Biofeedback;Cryotherapy;Fluidtherapy;Electrical Stimulation;Contrast Bath;Therapeutic exercise;Orthotic Fit/Training;Compression bandaging;Splinting;Taping;Vasopneumatic Device;Joint Manipulations;Spinal Manipulations;Visual/perceptual remediation/compensation;Vestibular    PT Next Visit Plan Continue standing LE strength, static balance and gait. Progress gait and balance as able.    PT Home Exercise Plan Eval: seated march, heel/toe raise, LAQs; 09/17/20: STS, standing abduction and marching wiht UE support counter 3/30 staggered stance with support 4/20 bridge, clam    Consulted and Agree with Plan of Care Patient           Patient will benefit from skilled therapeutic intervention in order to improve the following deficits and impairments:  Pain,Improper body mechanics,Abnormal gait,Decreased balance,Decreased activity tolerance,Decreased range of motion,Decreased strength,Hypomobility,Decreased mobility,Impaired  sensation,Difficulty walking  Visit Diagnosis: Other abnormalities of gait and mobility  Muscle weakness (generalized)     Problem List Patient Active Problem List   Diagnosis Date Noted  . Pain in joint of  right foot 04/15/2020  . Right spastic hemiparesis ([place]) 03/04/2020  . Chronic otitis externa of right ear 02/20/2020  . Intraparenchymal hemorrhage of brain ([place])   . Delirium   . 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 Stroke (cerebrum) ([place]) 01/26/2020  . Conductive hearing loss of right ear with unrestricted hearing of left ear 05/28/2019  . Hyponatremia 08/31/2017  . Generalized weakness 08/31/2017  . Dehydration 08/31/2017  . UTI (urinary tract infection) 08/31/2017  . Tympanic membrane rupture, right 12/01/2016  . Right ovarian cyst 08/22/2016  . Fibromyalgia 06/03/2015  . Cystocele 03/18/2014  . 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 Diverticulosis of colon without hemorrhage 06/12/2013  . Hypertension   . Osteopenia   . Chronic low back pain 05/12/2011  . HYPERCHOLESTEROLEMIA 03/17/2008  . Anxiety state 09/17/2007  . GERD 09/17/2007  . DJD (degenerative joint disease) 09/17/2007   2:10 PM, 10/14/20 Mearl Latin PT, DPT Physical Therapist at [place], [place], [address] Phone: [phone]   Fax:  [phone]  Name: Alison Cuevas MRN: [id number] Date of Birth: 1945/07/19

## 2020-10-14 NOTE — Progress Notes (Signed)
Botox Injection for spasticity of lower extremity using needle EMG guidance Indication: No diagnosis found. g81.11  Dilution: 100 Units/ml        Total Units Injected: 400 Indication: Severe spasticity which interferes with ADL,mobility and/or  hygiene and is unresponsive to medication management and other conservative care Informed consent was obtained after describing risks and benefits of the procedure with the patient. This includes bleeding, bruising, infection, excessive weakness, or medication side effects. A REMS form is on file and signed.  Needle: 20mm injectable monopolar needle electrode   Right lower Number of units per muscle  Quadriceps 0 units Gastroc/soleus 200 units Hamstrings 0 units Tibialis Posterior 50 units Tibialis Anterior 0 units Right gracilis and adductor magnus 150 units All injections were done after obtaining appropriate EMG activity and after negative drawback for blood. The patient tolerated the procedure well. Post procedure instructions were given. Return in about 3 months (around 01/13/2021) for botox 3-400 units right hip adductors, plantarflexors.

## 2020-10-14 NOTE — Patient Instructions (Signed)
PLEASE FEEL FREE TO CALL OUR OFFICE WITH ANY PROBLEMS OR QUESTIONS ([phone])                  STRETCH YOUR RIGHT HEEL CORD MULTIPLE TIMES EACH DAY!

## 2020-10-15 NOTE — Therapy (Addendum)
[place], [place], [address] Phone: [phone]   Fax:  [phone]  Occupational Therapy Treatment  Patient Details  Name: Alison Cuevas MRN: [id number] Date of Birth: 07/28/1945 Referring Provider (OT): Alger Simons, MD  Progress Note Reporting Period 09/02/20 to 10/14/20  See note below for Objective Data and Assessment of Progress/Goals.       Encounter Date: 10/14/2020   OT End of Session - 10/15/20 1440    Visit Number 10    Number of Visits 18    Date for OT Re-Evaluation 11/11/20    Authorization Type double coverage Medicare 80/20% secondary: [employer] Covers what medicare doesn't.    Progress Note Due on Visit 20    OT Start Time 1435   reassessment   OT Stop Time 1515    OT Time Calculation (min) 40 min    Activity Tolerance Patient tolerated treatment well    Behavior During Therapy WFL for tasks assessed/performed           Past Medical History:  Diagnosis Date  . Allergic rhinitis    maple pollen  . Anxiety   . Arthritis   . Bowel obstruction ([place])   . Chronic low back pain    Dr. Trenton Gammon.  Has had back injection--bp elevated after.  . Chronic pain syndrome   . Chronic renal insufficiency, stage 2 (mild)    GFR 60-70  . DDD (degenerative disc disease), cervical    Hx of ACDF (Dr. [name]).  Followed by Dr. [name].  Also, Dr. [name] do left C7-T1 intralaminal epidural injection.  . Diverticulosis of colon   . DJD (degenerative joint disease)   . Gallstones   . GERD (gastroesophageal reflux disease)   . Herpes zoster 07/08/2014  . 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  Resid R arm and leg weakness + flexion contractures.  . Hypercholesteremia    mild; pt declined statin trial 05/2014--needs recheck lipid panel at first f/u visit in 2017  . Hypertension   . Osteopenia 06/2017   T score -1.4  FRAX 15% / 2%  . Ovarian cyst 2017   82019-robotic assisted bilatel SPO: all path benign.  . Perianal dermatitis    prn cutivate  . Phlebitis   . Right hemiplegia ([place]) 01/2020   R arm and leg (hemorrhagic L cerebral hemisphere CVA)  . Right knee injury 2018   Patellofemoral crush injury--Dr. [name].  . Trochanteric bursitis of right hip    Recurrent (injection by Dr. [name] 05/26/15)  . Tympanic membrane rupture, right 12/01/2016   As of 08/2018 pt set for tympanomastoidectomy and STSG (Dr. [name]). Recurrent fungal/bact OE.  . Varicose vein    left leg     Past Surgical History:  Procedure Laterality Date  . ABDOMINAL HYSTERECTOMY  03/2015   TAH.  Last pap 2016.  No hx of abnl paps.  Per GYN, no further pap smears indicated.  . ANTERIOR AND POSTERIOR REPAIR N/A 03/18/2014   Procedure: Cystocele repair with graft, Vault suspension, Rectocele repair;  Surgeon: [name], MD;  [facility];  Service: Urology;  Laterality: N/A;  . CHOLECYSTECTOMY    . CHOLECYSTECTOMY, LAPAROSCOPIC    . COLON SURGERY    . COLONOSCOPY  04/2006; 05/26/16   2007 (Dr. [name]): Normal.  04/2016 (Dr. [name]) normal except diverticulosis and decreased anal sphincter tone.  No repeat colonoscopy is recommended due to  age.  . cspine surgery     Dr. Wiliam Ke level ant cerv discectomy /fusion w/plating  . CYSTO N/A 03/18/2014   Procedure: CYSTO;  Surgeon: [name], MD;  [facility];  Service: Urology;  Laterality: N/A;  . DEXA  07/30/2015; 06/2018   2017 and 2020 -->Osteopenia--repeat 2 yrs.  Marland Kitchen LYSIS OF ADHESION N/A 01/31/2018   Procedure: POSSIBLE LYSIS OF ADHESION;  Surgeon: Everitt Amber, MD;  [facility];  Service: Gynecology;  Laterality: N/A;  . [place]     with prolasped bledder repair  . RESECTION OF COLON     BENIGN TUMOR  . RIGHT EAR SURGERY  08/23/2017   Dr. 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 right ear surgery  12/12/2018   Right tympanomastoidectomy, ossiculoplasty with partial prosthesis, split thickness skin graft from postauricular skin 1x1cm, and right tragal cartilage graft 1x1 cm [facility])  . right hemicolectomy for diverticulitis with abscess  1993  . ROBOTIC ASSISTED BILATERAL SALPINGO OOPHERECTOMY Bilateral 01/31/2018   All PATH benign.  Procedure: XI ROBOTIC ASSISTED BILATERAL SALPINGO OOPHORECTOMY;  Surgeon: Everitt Amber, MD;  [facility];  Service: Gynecology;  Laterality: Bilateral;  . TRANSTHORACIC ECHOCARDIOGRAM  01/27/2020   EF 65-70%, NORMAL.  . TUBAL LIGATION      There were no vitals filed for this visit.   Subjective Assessment - 10/15/20 1438    Subjective  S: My sister is coming over this week to help me with my shower.    Currently in Pain? Yes    Pain Score 5     Pain Location Foot    Pain Orientation Right    Pain Descriptors / Indicators Aching    Pain Type Chronic pain    Multiple Pain Sites Yes    Pain Score 6    Pain Location Shoulder    Pain Orientation Right    Pain Descriptors / Indicators Aching;Sore    Pain Type Acute pain              [facility] OT Assessment - 10/14/20 1442      Assessment   Medical Diagnosis Right side weakness S/P CVA    Referring Provider (OT) Alger Simons, MD    Onset Date/Surgical Date 02/14/21      Precautions   Precautions Fall    Required Braces or Orthoses Other Brace/Splint    Other Brace/Splint Right AFO      Prior Function   Level of Independence Independent      Coordination   9 Hole Peg Test Right    Right 9 Hole Peg Test 44.6"   previous: 49.6"     ROM / Strength   AROM / PROM / Strength Strength      Strength   Overall Strength Comments Assessed seated. IR/er adducted    Strength Assessment Site Shoulder;Elbow;Hand    Right/Left Shoulder Right    Right Shoulder Flexion 4+/5   previous: same   Right  Shoulder ABduction 4+/5   previous: same   Right Shoulder External Rotation 3+/5   previous: same   Right/Left Elbow Right    Right Elbow Flexion 5/5   previous: 4-/5   Right Hand Grip (lbs) 45   previous: 40   Right Hand Lateral Pinch 12 lbs   previous: 9   Right Hand 3 Point Pinch 12 lbs  previous: 8                             OT Short Term Goals - 10/14/20 1457      OT SHORT TERM GOAL #1   Title Pt will be educated and independent with HEP in order to faciliate progress in therapy and work towards increasing her RUE strength and coordination while increasing her functional performance during ADL tasks.    Time 3    Period Weeks    Status Achieved    Target Date 09/23/20             OT Long Term Goals - 10/14/20 1458      OT LONG TERM GOAL #1   Title Patient will increase right shoulder strength and elbow to 5/5 while demonstrating increased shoulder and scapular stability when reaching away from her body/base of support.    Time 6    Period Weeks    Status On-going      OT LONG TERM GOAL #2   Title Patient will increase right hand coordination by completing the 9 hole peg test in 30 seconds or less in order to roll hair with less difficulty.    Time 6    Period Weeks    Status On-going      OT LONG TERM GOAL #3   Title Patient will increase right hand grip strength by 10# and pinch strength by 5# in order to increase ability to grasp and manipulate items with less difficulty.    Time 6    Period Weeks    Status On-going      OT LONG TERM GOAL #4   Title patient will reach highest level of independence with increased confidence and safety awareness while completing all ADL tasks at modified independent level or better while utilizing adaptive equipment and/or compensatory strategies if needed.    Baseline 3/9: Pt is sponge bathing and does not take a shower unless someone is present for supervision.    Time 6    Period Weeks    Status On-going                  Plan - 10/14/20 1500    Clinical Impression Statement A: Reassessment completed this date. Patient has met her short term goal and no additional long term goals at this time. She did experience an extended personal family crisis which prevented her from attending a few therapy sessions in a row. She did continue to complete her HEP if able although it was difficult. During this difficult life situation, patient is fearful that she may have lost ground with her therapy. She has made improvement with all areas tested today including shoulder strength, hand strength, and coordination. She will continue to benefit from skilled OT services to focus on these deficits. She reports that her sister is coming over to help her in the shower this week and will report on the event at next session.    Body Structure / Function / Physical Skills ADL;UE functional use;FMC;ROM;Proprioception;Coordination;GMC;Strength    OT Frequency 2x / week    OT Duration 4 weeks    Plan P: Continue skilled OT services to focus on coordination, hand strength, and shoulder strength while increasing her overall safety and independence level with self care tasks. Provide new schedule print out at next session.    Consulted and Agree with Plan of Care Patient  Patient will benefit from skilled therapeutic intervention in order to improve the following deficits and impairments:   Body Structure / Function / Physical Skills: ADL,UE functional use,FMC,ROM,Proprioception,Coordination,GMC,Strength       Visit Diagnosis: Other lack of coordination - Plan: Ot plan of care cert/re-cert  Other symptoms and signs involving the musculoskeletal system - Plan: Ot plan of care cert/re-cert    Problem List Patient Active Problem List   Diagnosis Date Noted  . Pain in joint of right foot 04/15/2020  . Right spastic hemiparesis ([place]) 03/04/2020  . Chronic otitis externa of right ear 02/20/2020  .  Intraparenchymal hemorrhage of brain ([place])   . Delirium   . 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 Stroke (cerebrum) ([place]) 01/26/2020  . Conductive hearing loss of right ear with unrestricted hearing of left ear 05/28/2019  . Hyponatremia 08/31/2017  . Generalized weakness 08/31/2017  . Dehydration 08/31/2017  . UTI (urinary tract infection) 08/31/2017  . Tympanic membrane rupture, right 12/01/2016  . Right ovarian cyst 08/22/2016  . Fibromyalgia 06/03/2015  . Cystocele 03/18/2014  . 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 Diverticulosis of colon without hemorrhage 06/12/2013  . Hypertension   . Osteopenia   . Chronic low back pain 05/12/2011  . HYPERCHOLESTEROLEMIA 03/17/2008  . Anxiety state 09/17/2007  . GERD 09/17/2007  . DJD (degenerative joint disease) 09/17/2007    Ailene Ravel, OTR/L,CBIS  [phone]  10/15/2020, 2:51 PM  [facility] [address] [place], [place], [address] Phone: [phone]   Fax:  [phone]  Name: Alison Cuevas MRN: [id number] Date of Birth: [birth year]

## 2020-10-19 ENCOUNTER — Ambulatory Visit (HOSPITAL_COMMUNITY): Payer: Medicare Other | Admitting: Physical Therapy

## 2020-10-19 ENCOUNTER — Ambulatory Visit (HOSPITAL_COMMUNITY): Payer: Medicare Other

## 2020-10-19 ENCOUNTER — Encounter (HOSPITAL_COMMUNITY): Payer: Medicare Other

## 2020-10-19 ENCOUNTER — Encounter (HOSPITAL_COMMUNITY): Payer: Self-pay | Admitting: Physical Therapy

## 2020-10-19 ENCOUNTER — Other Ambulatory Visit: Payer: Self-pay

## 2020-10-19 ENCOUNTER — Encounter (HOSPITAL_COMMUNITY): Payer: Self-pay

## 2020-10-19 DIAGNOSIS — R278 Other lack of coordination: Secondary | ICD-10-CM

## 2020-10-19 DIAGNOSIS — M6281 Muscle weakness (generalized): Secondary | ICD-10-CM

## 2020-10-19 DIAGNOSIS — R29898 Other symptoms and signs involving the musculoskeletal system: Secondary | ICD-10-CM

## 2020-10-19 DIAGNOSIS — R2689 Other abnormalities of gait and mobility: Secondary | ICD-10-CM

## 2020-10-19 DIAGNOSIS — G8111 Spastic hemiplegia affecting right dominant side: Secondary | ICD-10-CM | POA: Diagnosis not present

## 2020-10-19 NOTE — Therapy (Signed)
[place], [place], [address] Phone: [phone]   Fax:  [phone]  Occupational Therapy Treatment  Patient Details  Name: Alison Cuevas MRN: [id number] Date of Birth: October 13, 1945 Referring Provider (OT): Alger Simons, MD   Encounter Date: 10/19/2020   OT End of Session - 10/19/20 1238    Visit Number 11    Number of Visits 18    Date for OT Re-Evaluation 11/11/20    Authorization Type double coverage Medicare 80/20% secondary: [employer] Covers what medicare doesn't.    Progress Note Due on Visit 20    OT Start Time 1115    OT Stop Time 1200    OT Time Calculation (min) 45 min    Activity Tolerance Patient tolerated treatment well    Behavior During Therapy WFL for tasks assessed/performed           Past Medical History:  Diagnosis Date  . Allergic rhinitis    maple pollen  . Anxiety   . Arthritis   . Bowel obstruction ([place])   . Chronic low back pain    Dr. Trenton Gammon.  Has had back injection--bp elevated after.  . Chronic pain syndrome   . Chronic renal insufficiency, stage 2 (mild)    GFR 60-70  . DDD (degenerative disc disease), cervical    Hx of ACDF (Dr. [name]).  Followed by Dr. [name].  Also, Dr. [name] do left C7-T1 intralaminal epidural injection.  . Diverticulosis of colon   . DJD (degenerative joint disease)   . Gallstones   . GERD (gastroesophageal reflux disease)   . Herpes zoster 07/08/2014  . 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  Resid R arm and leg weakness + flexion contractures.  . Hypercholesteremia    mild; pt declined statin trial 05/2014--needs recheck lipid panel at first f/u visit in 2017  . Hypertension   . Osteopenia 06/2017   T score -1.4 FRAX 15% / 2%  . Ovarian cyst 2017   82019-robotic assisted bilatel SPO: all path benign.  . Perianal dermatitis    prn cutivate  .  Phlebitis   . Right hemiplegia ([place]) 01/2020   R arm and leg (hemorrhagic L cerebral hemisphere CVA)  . Right knee injury 2018   Patellofemoral crush injury--Dr. [name].  . Trochanteric bursitis of right hip    Recurrent (injection by Dr. [name] 05/26/15)  . Tympanic membrane rupture, right 12/01/2016   As of 08/2018 pt set for tympanomastoidectomy and STSG (Dr. [name]). Recurrent fungal/bact OE.  . Varicose vein    left leg     Past Surgical History:  Procedure Laterality Date  . ABDOMINAL HYSTERECTOMY  03/2015   TAH.  Last pap 2016.  No hx of abnl paps.  Per GYN, no further pap smears indicated.  . ANTERIOR AND POSTERIOR REPAIR N/A 03/18/2014   Procedure: Cystocele repair with graft, Vault suspension, Rectocele repair;  Surgeon: [name], MD;  Location: [facility];  Service: Urology;  Laterality: N/A;  . CHOLECYSTECTOMY    . CHOLECYSTECTOMY, LAPAROSCOPIC    . COLON SURGERY    . COLONOSCOPY  04/2006; 05/26/16   2007 (Dr. [name]): Normal.  04/2016 (Dr. [name]) normal except diverticulosis and decreased anal sphincter tone.  No repeat colonoscopy is recommended due to age.  . cspine surgery     Dr. Wiliam Ke level ant cerv discectomy /fusion w/plating  . CYSTO N/A 03/18/2014   Procedure:  CYSTO;  Surgeon: [name], MD;  Location: [facility];  Service: Urology;  Laterality: N/A;  . DEXA  07/30/2015; 06/2018   2017 and 2020 -->Osteopenia--repeat 2 yrs.  Marland Kitchen LYSIS OF ADHESION N/A 01/31/2018   Procedure: POSSIBLE LYSIS OF ADHESION;  Surgeon: Everitt Amber, MD;  Location: [facility];  Service: Gynecology;  Laterality: N/A;  . [place]     with prolasped bledder repair  . RESECTION OF COLON     BENIGN TUMOR  . RIGHT EAR SURGERY  08/23/2017   Dr. 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 right ear surgery  12/12/2018   Right  tympanomastoidectomy, ossiculoplasty with partial prosthesis, split thickness skin graft from postauricular skin 1x1cm, and right tragal cartilage graft 1x1 cm [facility])  . right hemicolectomy for diverticulitis with abscess  1993  . ROBOTIC ASSISTED BILATERAL SALPINGO OOPHERECTOMY Bilateral 01/31/2018   All PATH benign.  Procedure: XI ROBOTIC ASSISTED BILATERAL SALPINGO OOPHORECTOMY;  Surgeon: Everitt Amber, MD;  Location: [facility];  Service: Gynecology;  Laterality: Bilateral;  . TRANSTHORACIC ECHOCARDIOGRAM  01/27/2020   EF 65-70%, NORMAL.  . TUBAL LIGATION      There were no vitals filed for this visit.   Subjective Assessment - 10/19/20 1120    Subjective  S: My arm has been sore and tight recently.    Currently in Pain? Yes    Pain Score 8     Pain Location Arm    Pain Orientation Right    Pain Descriptors / Indicators Tightness    Pain Type Acute pain    Pain Radiating Towards forearm to inside of elbow    Pain Onset More than a month ago    Pain Frequency Constant    Aggravating Factors  maybe increased use    Pain Relieving Factors nothing    Effect of Pain on Daily Activities mod effect              [facility] OT Assessment - 10/19/20 1122      Assessment   Medical Diagnosis Right side weakness S/P CVA      Precautions   Precautions Fall    Required Braces or Orthoses Other Brace/Splint    Other Brace/Splint Right AFO                    OT Treatments/Exercises (OP) - 10/19/20 1122      Exercises   Exercises Shoulder;Elbow;Wrist;Hand;Theraputty      Shoulder Exercises: Seated   Protraction AROM;Strengthening;10 reps    Protraction Weight (lbs) 1    Horizontal ABduction AROM;10 reps    External Rotation PROM;10 reps    Internal Rotation PROM;10 reps    Flexion PROM;AROM;10 reps    Abduction PROM;AROM;10 reps      Elbow Exercises   Other elbow exercises Elbow extension stretch; P/ROM, 2x 10"      Additional Elbow Exercises   Theraputty - Grip yellow       Wrist Exercises   Other wrist exercises Passive wrist/forearm stretch - wrist flexion 2x10", wrist extension 2x10"      Theraputty   Theraputty - Pinch yellow - 3 point pinch      Manual Therapy   Manual Therapy Myofascial release    Manual therapy comments completed separately from therapeutic exercises    Myofascial Release myofascial release to right dorsal forearm at lateral and  medial epicondyle region to decrease pain and fascial restrictions and improve RUE mobility                    OT Short Term Goals - 10/19/20 1246      OT SHORT TERM GOAL #1   Title Pt will be educated and independent with HEP in order to faciliate progress in therapy and work towards increasing her RUE strength and coordination while increasing her functional performance during ADL tasks.    Time 3    Period Weeks    Target Date 09/23/20             OT Long Term Goals - 10/14/20 1458      OT LONG TERM GOAL #1   Title Patient will increase right shoulder strength and elbow to 5/5 while demonstrating increased shoulder and scapular stability when reaching away from her body/base of support.    Time 6    Period Weeks    Status On-going      OT LONG TERM GOAL #2   Title Patient will increase right hand coordination by completing the 9 hole peg test in 30 seconds or less in order to roll hair with less difficulty.    Time 6    Period Weeks    Status On-going      OT LONG TERM GOAL #3   Title Patient will increase right hand grip strength by 10# and pinch strength by 5# in order to increase ability to grasp and manipulate items with less difficulty.    Time 6    Period Weeks    Status On-going      OT LONG TERM GOAL #4   Title patient will reach highest level of independence with increased confidence and safety awareness while completing all ADL tasks at modified independent level or better while utilizing adaptive equipment and/or compensatory strategies if needed.    Baseline 3/9:  Pt is sponge bathing and does not take a shower unless someone is present for supervision.    Time 6    Period Weeks    Status On-going                 Plan - 10/19/20 1239    Clinical Impression Statement A: pt reports increased tightness in RUE with increased soreness located in the forearm and medial epicondyle region of the right elbow. Complete myofascial release and passive stretching to address. Provided yellow theraputty to take home to decrease amount of strain to wrist and elbow. Discussed decreasing the amount of force when gripping and pinching putty and to not complete if causing increased pain. Reviewed HEP and removed proximal shoulder strengthening handout from evaluation while keeping theraputty and shoulder strengthening only.    Body Structure / Function / Physical Skills ADL;UE functional use;FMC;ROM;Proprioception;Coordination;GMC;Strength    Plan P: Follow up on elbow pain. Complete shoulder stretching if needed prior to shoulder strengthening. Overhead lacing.    Consulted and Agree with Plan of Care Patient           Patient will benefit from skilled therapeutic intervention in order to improve the following deficits and impairments:   Body Structure / Function / Physical Skills: ADL,UE functional use,FMC,ROM,Proprioception,Coordination,GMC,Strength       Visit Diagnosis: Other lack of coordination  Other symptoms and signs involving the musculoskeletal system    Problem List Patient Active Problem List   Diagnosis Date Noted  . Pain in joint of right foot 04/15/2020  .  Right spastic hemiparesis ([place]) 03/04/2020  . Chronic otitis externa of right ear 02/20/2020  . Intraparenchymal hemorrhage of brain ([place])   . Delirium   . 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 Stroke (cerebrum) ([place]) 01/26/2020  . Conductive hearing loss of right ear with unrestricted hearing of left ear 05/28/2019  . Hyponatremia  08/31/2017  . Generalized weakness 08/31/2017  . Dehydration 08/31/2017  . UTI (urinary tract infection) 08/31/2017  . Tympanic membrane rupture, right 12/01/2016  . Right ovarian cyst 08/22/2016  . Fibromyalgia 06/03/2015  . Cystocele 03/18/2014  . 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 Diverticulosis of colon without hemorrhage 06/12/2013  . Hypertension   . Osteopenia   . Chronic low back pain 05/12/2011  . HYPERCHOLESTEROLEMIA 03/17/2008  . Anxiety state 09/17/2007  . GERD 09/17/2007  . DJD (degenerative joint disease) 09/17/2007    Ailene Ravel, OTR/L,CBIS  [phone]  10/19/2020, 12:47 PM  [facility] [address] [place], [place], [address] Phone: [phone]   Fax:  [phone]  Name: Adeleine Pask MRN: [id number] Date of Birth: 06-May-1946

## 2020-10-19 NOTE — Therapy (Signed)
[place], [place], [address] Phone: [phone]   Fax:  [phone]  Physical Therapy Treatment  Patient Details  Name: Alison Cuevas MRN: [id number] Date of Birth: 1946/06/22 Referring Provider (PT): Alger Simons MD   Encounter Date: 10/19/2020   PT End of Session - 10/19/20 1038    Visit Number 13    Number of Visits 16    Date for PT Re-Evaluation 10/28/20    Authorization Type Medicare A/ [place] NAP 2ndary    Progress Note Due on Visit 20    PT Start Time 1034    PT Stop Time 1115    PT Time Calculation (min) 41 min    Activity Tolerance Patient tolerated treatment well    Behavior During Therapy [facility] for tasks assessed/performed           Past Medical History:  Diagnosis Date  . Allergic rhinitis    maple pollen  . Anxiety   . Arthritis   . Bowel obstruction ([place])   . Chronic low back pain    Dr. Trenton Gammon.  Has had back injection--bp elevated after.  . Chronic pain syndrome   . Chronic renal insufficiency, stage 2 (mild)    GFR 60-70  . DDD (degenerative disc disease), cervical    Hx of ACDF (Dr. [name]).  Followed by Dr. [name].  Also, Dr. [name] do left C7-T1 intralaminal epidural injection.  . Diverticulosis of colon   . DJD (degenerative joint disease)   . Gallstones   . GERD (gastroesophageal reflux disease)   . Herpes zoster 07/08/2014  . 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  Resid R arm and leg weakness + flexion contractures.  . Hypercholesteremia    mild; pt declined statin trial 05/2014--needs recheck lipid panel at first f/u visit in 2017  . Hypertension   . Osteopenia 06/2017   T score -1.4 FRAX 15% / 2%  . Ovarian cyst 2017   82019-robotic assisted bilatel SPO: all path benign.  . Perianal dermatitis    prn cutivate  . Phlebitis   . Right hemiplegia ([place]) 01/2020   R arm and leg  (hemorrhagic L cerebral hemisphere CVA)  . Right knee injury 2018   Patellofemoral crush injury--Dr. [name].  . Trochanteric bursitis of right hip    Recurrent (injection by Dr. [name] 05/26/15)  . Tympanic membrane rupture, right 12/01/2016   As of 08/2018 pt set for tympanomastoidectomy and STSG (Dr. [name]). Recurrent fungal/bact OE.  . Varicose vein    left leg     Past Surgical History:  Procedure Laterality Date  . ABDOMINAL HYSTERECTOMY  03/2015   TAH.  Last pap 2016.  No hx of abnl paps.  Per GYN, no further pap smears indicated.  . ANTERIOR AND POSTERIOR REPAIR N/A 03/18/2014   Procedure: Cystocele repair with graft, Vault suspension, Rectocele repair;  Surgeon: [name], MD;  [facility];  Service: Urology;  Laterality: N/A;  . CHOLECYSTECTOMY    . CHOLECYSTECTOMY, LAPAROSCOPIC    . COLON SURGERY    . COLONOSCOPY  04/2006; 05/26/16   2007 (Dr. [name]): Normal.  04/2016 (Dr. [name]) normal except diverticulosis and decreased anal sphincter tone.  No repeat colonoscopy is recommended due to age.  . cspine surgery     Dr. Wiliam Ke level ant cerv discectomy /fusion w/plating  . CYSTO N/A 03/18/2014   Procedure: CYSTO;  Surgeon: Elayne Snare  MacDiarmid, MD;  Location: [facility];  Service: Urology;  Laterality: N/A;  . DEXA  07/30/2015; 06/2018   2017 and 2020 -->Osteopenia--repeat 2 yrs.  Marland Kitchen LYSIS OF ADHESION N/A 01/31/2018   Procedure: POSSIBLE LYSIS OF ADHESION;  Surgeon: Everitt Amber, MD;  Location: [facility];  Service: Gynecology;  Laterality: N/A;  . [place]     with prolasped bledder repair  . RESECTION OF COLON     BENIGN TUMOR  . RIGHT EAR SURGERY  08/23/2017   Dr. 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 right ear surgery  12/12/2018   Right tympanomastoidectomy, ossiculoplasty with partial prosthesis, split thickness  skin graft from postauricular skin 1x1cm, and right tragal cartilage graft 1x1 cm [facility])  . right hemicolectomy for diverticulitis with abscess  1993  . ROBOTIC ASSISTED BILATERAL SALPINGO OOPHERECTOMY Bilateral 01/31/2018   All PATH benign.  Procedure: XI ROBOTIC ASSISTED BILATERAL SALPINGO OOPHORECTOMY;  Surgeon: Everitt Amber, MD;  [facility];  Service: Gynecology;  Laterality: Bilateral;  . TRANSTHORACIC ECHOCARDIOGRAM  01/27/2020   EF 65-70%, NORMAL.  . TUBAL LIGATION      There were no vitals filed for this visit.   Subjective Assessment - 10/19/20 1038    Subjective Patient says she has been walking a lot lately. Having some pain in LT groin and RT hip today. Says her granddaughter worked her good with exercises yesterday.    Currently in Pain? Yes    Pain Score 5     Pain Location Leg    Pain Orientation Right;Left    Pain Descriptors / Indicators Aching    Pain Type Chronic pain    Pain Onset More than a month ago    Pain Frequency Intermittent                             [facility] Adult PT Treatment/Exercise - 10/19/20 0001      Knee/Hip Exercises: Standing   Hip Abduction Both;2 sets;10 reps    Hip Extension Both;2 sets;10 reps    Extension Limitations LLE elevated on 2 inch box    Forward Step Up Both;10 reps;Hand Hold: 2;2 sets;Step Height: 6"    Gait Training 200 feet with hemi walker cues for RLE heel strike    Other Standing Knee Exercises standing balance on foam 3 x 30", standing march on foam 2 x 20 HHA x 1    Other Standing Knee Exercises step taps on 4 inch x 20, on 6 inch x 20 with HHA x 1                    PT Short Term Goals - 10/09/20 1118      PT SHORT TERM GOAL #1   Title Patient will be independent with initial HEP and self-management strategies to improve functional outcomes    Time 4    Period Weeks    Status On-going    Target Date 10/16/20      PT SHORT TERM GOAL #2   Title Patient will be able to ambulate at  least 100 feet during 2MWT with LRAD to demonstrate improved ability to perform functional mobility and associated tasks.    Baseline 80 ft with HW and SBA-CGA level surfaces    Time 4    Period Weeks  Status On-going    Target Date 10/16/20             PT Long Term Goals - 10/09/20 1118      PT LONG TERM GOAL #1   Title Patient will be able to ambulate at least 225 feet during 2MWT with LRAD to demonstrate improved ability to perform functional mobility and associated tasks.    Baseline 80 ft with HW and CGA-SBA    Time 8    Period Weeks    Status On-going    Target Date 10/28/20      PT LONG TERM GOAL #2   Title Patient will have equal to or > 4/5 MMT throughout RLE to improve ability to perform functional mobility, stair ambulation and ADLs.    Baseline RLE 3 to 3+/5    Time 8    Period Weeks    Status On-going    Target Date 10/28/20      PT LONG TERM GOAL #3   Title Patient will be able to maintain semi tandem stance >30 seconds on BLEs to improve stability and reduce risk for falls    Baseline 30 sec with LLE in rear maintained with CGA-SBA, unable to maintain with RLE in rear    Time 8    Period Weeks    Status On-going    Target Date 10/28/20                 Plan - 10/19/20 1129    Clinical Impression Statement Patient tolerated session well today. Continues to show improvement with ambulation and gait distance. Shows good return with hemi walker. Progressed standing balance activity today. Added standing balance on foam and alternating march on foam. Patient was well challenged with this. Patient requires ongoing cues for upright posture with standing hip strengthening and for proper form with standing hip extension. Patient will continue to benefit from skilled therapy services to reduce deficits and improve functional ability.    Personal Factors and Comorbidities Age;Comorbidity 1    Comorbidities CVA    Examination-Activity Limitations Locomotion  Level;Bed Mobility;Stand;Stairs;Transfers    Examination-Participation Restrictions Community Activity;Yard Work;Laundry;Driving;Cleaning    Stability/Clinical Decision Making Stable/Uncomplicated    Rehab Potential Good    PT Frequency 2x / week    PT Duration 8 weeks    PT Treatment/Interventions ADLs/Self Care Home Management;Aquatic Therapy;Iontophoresis 4mg /ml Dexamethasone;DME Instruction;Balance training;Passive range of motion;Scar mobilization;Neuromuscular re-education;Gait training;Stair training;Functional mobility training;Patient/family education;Manual techniques;Dry needling;Energy conservation;Manual lymph drainage;Parrafin;Ultrasound;Therapeutic activities;Canalith Repostioning;Moist Heat;Traction;Biofeedback;Cryotherapy;Fluidtherapy;Electrical Stimulation;Contrast Bath;Therapeutic exercise;Orthotic Fit/Training;Compression bandaging;Splinting;Taping;Vasopneumatic Device;Joint Manipulations;Spinal Manipulations;Visual/perceptual remediation/compensation;Vestibular    PT Next Visit Plan Continue standing LE strength, static balance and gait. Progress gait and balance as able. Add sidestepping, amb over hurdles.    PT Home Exercise Plan Eval: seated march, heel/toe raise, LAQs; 09/17/20: STS, standing abduction and marching wiht UE support counter 3/30 staggered stance with support 4/20 bridge, clam    Consulted and Agree with Plan of Care Patient           Patient will benefit from skilled therapeutic intervention in order to improve the following deficits and impairments:  Pain,Improper body mechanics,Abnormal gait,Decreased balance,Decreased activity tolerance,Decreased range of motion,Decreased strength,Hypomobility,Decreased mobility,Impaired sensation,Difficulty walking  Visit Diagnosis: Other abnormalities of gait and mobility  Muscle weakness (generalized)     Problem List Patient Active Problem List   Diagnosis Date Noted  . Pain in joint of right foot 04/15/2020   . Right spastic hemiparesis ([place]) 03/04/2020  . Chronic otitis externa of right ear 02/20/2020  .  Intraparenchymal hemorrhage of brain ([place])   . Delirium   . 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 Stroke (cerebrum) ([place]) 01/26/2020  . Conductive hearing loss of right ear with unrestricted hearing of left ear 05/28/2019  . Hyponatremia 08/31/2017  . Generalized weakness 08/31/2017  . Dehydration 08/31/2017  . UTI (urinary tract infection) 08/31/2017  . Tympanic membrane rupture, right 12/01/2016  . Right ovarian cyst 08/22/2016  . Fibromyalgia 06/03/2015  . Cystocele 03/18/2014  . 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 Diverticulosis of colon without hemorrhage 06/12/2013  . Hypertension   . Osteopenia   . Chronic low back pain 05/12/2011  . HYPERCHOLESTEROLEMIA 03/17/2008  . Anxiety state 09/17/2007  . GERD 09/17/2007  . DJD (degenerative joint disease) 09/17/2007   11:32 AM, 10/19/20 Josue Hector PT DPT  Physical Therapist with [facility]  [phone] [place] [address] [place], [place], [address] Phone: [phone]   Fax:  [phone]  Name: Alison Cuevas MRN: [id number] Date of Birth: 1946/01/01

## 2020-10-20 ENCOUNTER — Ambulatory Visit (INDEPENDENT_AMBULATORY_CARE_PROVIDER_SITE_OTHER): Payer: Medicare Other | Admitting: Nurse Practitioner

## 2020-10-20 ENCOUNTER — Encounter: Payer: Self-pay | Admitting: Nurse Practitioner

## 2020-10-20 VITALS — BP 120/70 | HR 64 | Temp 98.6°F | Resp 16

## 2020-10-20 DIAGNOSIS — N7689 Other specified inflammation of vagina and vulva: Secondary | ICD-10-CM

## 2020-10-20 DIAGNOSIS — R350 Frequency of micturition: Secondary | ICD-10-CM | POA: Diagnosis not present

## 2020-10-20 LAB — URINALYSIS, COMPLETE W/RFL CULTURE
Bacteria, UA: NONE SEEN /HPF
Bilirubin Urine: NEGATIVE
Glucose, UA: NEGATIVE
Hgb urine dipstick: NEGATIVE
Hyaline Cast: NONE SEEN /LPF
Ketones, ur: NEGATIVE
Leukocyte Esterase: NEGATIVE
Nitrites, Initial: NEGATIVE
Protein, ur: NEGATIVE
RBC / HPF: NONE SEEN /HPF (ref 0–2)
Specific Gravity, Urine: 1.01 (ref 1.001–1.035)
pH: 7 (ref 5.0–8.0)

## 2020-10-20 LAB — WET PREP FOR TRICH, YEAST, CLUE

## 2020-10-20 MED ORDER — NYSTATIN 100000 UNIT/GM EX OINT: 1.0000 "application " | TOPICAL_OINTMENT | Freq: Two times a day (BID) | CUTANEOUS | 0 refills | Status: DC

## 2020-10-20 NOTE — Progress Notes (Signed)
GYNECOLOGY  VISIT  CC:   Pelvic pain, vulvar irritation  HPI: 75 y.o. G30P0013 Widowed White or Caucasian female here for vaginal itching, urinary, frequency & urgency.  Pt did take a Diflucan pill over the weekend, had some left over from Doctor Fontaine's prescription.   Denies urinary frequency but does feel some dysuria (burning). Also feels suprapubic pressure constantly. Denies urinary incontinence. Has been feeling this sensation since Sunday, October 18, 2020.   Reports some bowel incontinence. Wears Depends sometimes because of poor mobility and is slow to go to restroom.  Last August had a stroke, then went to rehab facility x 1 month. Has right side weakness and now in wheel chair. She also lost her husband recently, they were married x 60 years.  GYNECOLOGIC HISTORY: Patient's last menstrual period was 06/27/1972. Contraception: hysterectomy Menopausal hormone therapy: none  Patient Active Problem List   Diagnosis Date Noted  . Pain in joint of right foot 04/15/2020  . Right spastic hemiparesis ([place]) 03/04/2020  . Chronic otitis externa of right ear 02/20/2020  . Intraparenchymal hemorrhage of brain ([place])   . Delirium   . 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 Stroke (cerebrum) ([place]) 01/26/2020  . Conductive hearing loss of right ear with unrestricted hearing of left ear 05/28/2019  . Hyponatremia 08/31/2017  . Generalized weakness 08/31/2017  . Dehydration 08/31/2017  . UTI (urinary tract infection) 08/31/2017  . Tympanic membrane rupture, right 12/01/2016  . Right ovarian cyst 08/22/2016  . Fibromyalgia 06/03/2015  . Cystocele 03/18/2014  . 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 Diverticulosis of colon without hemorrhage 06/12/2013  . Hypertension   . Osteopenia   . Chronic low back pain 05/12/2011  . HYPERCHOLESTEROLEMIA 03/17/2008  . Anxiety state 09/17/2007  . GERD 09/17/2007  . DJD (degenerative joint disease)  09/17/2007    Past Medical History:  Diagnosis Date  . Allergic rhinitis    maple pollen  . Anxiety   . Arthritis   . Bowel obstruction ([place])   . Chronic low back pain    Dr. Trenton Gammon.  Has had back injection--bp elevated after.  . Chronic pain syndrome   . Chronic renal insufficiency, stage 2 (mild)    GFR 60-70  . DDD (degenerative disc disease), cervical    Hx of ACDF (Dr. [name]).  Followed by Dr. [name].  Also, Dr. [name] do left C7-T1 intralaminal epidural injection.  . Diverticulosis of colon   . DJD (degenerative joint disease)   . Gallstones   . GERD (gastroesophageal reflux disease)   . Herpes zoster 07/08/2014  . 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  Resid R arm and leg weakness + flexion contractures.  . Hypercholesteremia    mild; pt declined statin trial 05/2014--needs recheck lipid panel at first f/u visit in 2017  . Hypertension   . Osteopenia 06/2017   T score -1.4 FRAX 15% / 2%  . Ovarian cyst 2017   82019-robotic assisted bilatel SPO: all path benign.  . Perianal dermatitis    prn cutivate  . Phlebitis   . Right hemiplegia ([place]) 01/2020   R arm and leg (hemorrhagic L cerebral hemisphere CVA)  . Right knee injury 2018   Patellofemoral crush injury--Dr. [name].  . Stroke ([place])   . Trochanteric bursitis of right hip    Recurrent (injection by Dr. [name] 05/26/15)  . Tympanic membrane rupture, right 12/01/2016   As of 08/2018  pt set for tympanomastoidectomy and STSG (Dr. [name]). Recurrent fungal/bact OE.  . Varicose vein    left leg     Past Surgical History:  Procedure Laterality Date  . ABDOMINAL HYSTERECTOMY  03/2015   TAH.  Last pap 2016.  No hx of abnl paps.  Per GYN, no further pap smears indicated.  . ANTERIOR AND POSTERIOR REPAIR N/A 03/18/2014   Procedure: Cystocele repair with graft, Vault suspension, Rectocele repair;  Surgeon: [name], MD;  Location: [facility];  Service: Urology;  Laterality: N/A;  . CHOLECYSTECTOMY    . CHOLECYSTECTOMY, LAPAROSCOPIC    . COLON SURGERY    . COLONOSCOPY  04/2006; 05/26/16   2007 (Dr. [name]): Normal.  04/2016 (Dr. [name]) normal except diverticulosis and decreased anal sphincter tone.  No repeat colonoscopy is recommended due to age.  . cspine surgery     Dr. Wiliam Ke level ant cerv discectomy /fusion w/plating  . CYSTO N/A 03/18/2014   Procedure: CYSTO;  Surgeon: [name], MD;  Location: [facility];  Service: Urology;  Laterality: N/A;  . DEXA  07/30/2015; 06/2018   2017 and 2020 -->Osteopenia--repeat 2 yrs.  Marland Kitchen LYSIS OF ADHESION N/A 01/31/2018   Procedure: POSSIBLE LYSIS OF ADHESION;  Surgeon: Everitt Amber, MD;  Location: [facility];  Service: Gynecology;  Laterality: N/A;  . [place]     with prolasped bledder repair  . RESECTION OF COLON     BENIGN TUMOR  . RIGHT EAR SURGERY  08/23/2017   Dr. 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 right ear surgery  12/12/2018   Right tympanomastoidectomy, ossiculoplasty with partial prosthesis, split thickness skin graft from postauricular skin 1x1cm, and right tragal cartilage graft 1x1 cm [facility])  . right hemicolectomy for diverticulitis with abscess  1993  . ROBOTIC ASSISTED BILATERAL SALPINGO OOPHERECTOMY Bilateral 01/31/2018   All PATH benign.  Procedure: XI ROBOTIC ASSISTED BILATERAL SALPINGO OOPHORECTOMY;  Surgeon: Everitt Amber, MD;  Location: [facility];  Service: Gynecology;  Laterality: Bilateral;  . TRANSTHORACIC ECHOCARDIOGRAM  01/27/2020   EF 65-70%, NORMAL.  . TUBAL LIGATION      MEDS:   Current Outpatient Medications on File Prior to Visit  Medication Sig Dispense Refill  . acetaminophen (TYLENOL) 500 MG tablet Take 1 tablet (500 mg total) by mouth 3 (three) times daily. 30 tablet 0   . ALPRAZolam (XANAX) 0.5 MG tablet TAKE 1 TABLET BY MOUTH THREE TIMES A DAY AS NEEDED FOR ANXIETY 90 tablet 5  . aspirin EC 81 MG tablet Take 1 tablet (81 mg total) by mouth daily. Swallow whole. 30 tablet 11  . Biotin 5 MG TABS Take 5 mg by mouth daily.    . fluticasone (FLONASE) 50 MCG/ACT nasal spray Place 2 sprays into both nostrils at bedtime. 48 g 3  . HYDROcodone-acetaminophen (NORCO/VICODIN) 5-325 MG tablet 1/2-1 tab po q6h prn pain 15 tablet 0  . lisinopril (ZESTRIL) 5 MG tablet Take 1 tablet (5 mg total) by mouth daily. 90 tablet 3  . loratadine (CLARITIN) 10 MG tablet Take 10 mg by mouth every other day.     . meclizine (ANTIVERT) 12.5 MG tablet Take 1 tablet (12.5 mg total) by mouth 3 (three) times daily. (Patient taking differently: Take 12.5 mg by mouth. Takes at night) 30 tablet 0  . Multiple Minerals-Vitamins (CALCIUM-MAGNESIUM-ZINC-D3 PO) Take 1 tablet by mouth daily.     Marland Kitchen  pantoprazole (PROTONIX) 40 MG tablet Take 1 tablet (40 mg total) by mouth at bedtime. 90 tablet 3  . polyethylene glycol (MIRALAX / GLYCOLAX) 17 g packet Take 17 g by mouth daily as needed.    . potassium chloride SA (KLOR-CON) 20 MEQ tablet Take 1 tablet (20 mEq total) by mouth daily. 90 tablet 3  . Sodium Fluoride (CLINPRO 5000) 1.1 % PSTE Clinpro 5000 1.1 % dental paste    . tiZANidine (ZANAFLEX) 2 MG tablet 1 tab po tid prn for muscle relaxation 90 tablet 3  . triamcinolone ointment (KENALOG) 0.1 % triamcinolone acetonide 0.1 % topical ointment    . verapamil (CALAN-SR) 180 MG CR tablet Take 1 tablet (180 mg total) by mouth daily. 90 tablet 3   No current facility-administered medications on file prior to visit.    ALLERGIES: Gabapentin and Prednisone  Family History  Problem Relation Age of Onset  . Hypertension Mother   . Diverticulitis Mother   . Breast cancer Sister 45  . Melanoma Brother   . Prostate cancer Brother   . Leukemia Brother   . Lupus Sister   . Lung disease Brother   .  Stomach cancer Father   . Other Other        Family member with MGUS     Review of Systems  Constitutional: Negative.   HENT: Negative.   Eyes: Negative.   Respiratory: Negative.   Cardiovascular: Negative.   Gastrointestinal: Negative.   Endocrine: Negative.   Genitourinary: Positive for dysuria, frequency and urgency.       Vaginal itching  Musculoskeletal: Negative.   Skin: Negative.   Allergic/Immunologic: Negative.   Neurological: Negative.   Hematological: Negative.   Psychiatric/Behavioral: Negative.     PHYSICAL EXAMINATION:    BP 120/70   Pulse 64   Resp 16   LMP 06/27/1972     General appearance: alert, cooperative, no acute distress  Pelvic: External genitalia:  Skin abrasion bilateral, red, breakdown of inner labia majora              Urethra:  normal appearing urethra with no masses, tenderness or lesions              Bartholins and Skenes: normal                 Vagina: normal appearing vagina for age, atrophic, relaxation, minimal discharge  Abdomen: no palpable mass, tender in suprapubic area                     Bimanual Exam:  Uterus:  Surgically removed              Adnexa: no mass, fullness, tenderness Wet mount: appears normal (neg yeast, neg trich, neg clue)              Chaperone, Joy, CMA, was present for exam.  Assessment: Urinary frequency - Plan: Urinalysis,Complete w/RFL Culture, WET PREP FOR TRICH, YEAST, CLUE  Inflammatory disorder of skin of vulva - Plan: nystatin ointment (MYCOSTATIN)  Suspect patient may be having a little urinary retention, as she emptied bladder after exam and had relief of pain. Discussed after urinating, try to lean forward and try to urinate again.  Plan: Recommended that if she is not completely resolved, RTC for re-evaluation in 2 weeks or sooner if symptoms worse. Will contact patient when results of urine culture are back

## 2020-10-21 ENCOUNTER — Encounter (HOSPITAL_COMMUNITY): Payer: Self-pay | Admitting: Physical Therapy

## 2020-10-21 ENCOUNTER — Ambulatory Visit (HOSPITAL_COMMUNITY): Payer: Medicare Other | Admitting: Occupational Therapy

## 2020-10-21 ENCOUNTER — Other Ambulatory Visit: Payer: Self-pay

## 2020-10-21 ENCOUNTER — Ambulatory Visit (HOSPITAL_COMMUNITY): Payer: Medicare Other | Admitting: Physical Therapy

## 2020-10-21 ENCOUNTER — Encounter (HOSPITAL_COMMUNITY): Payer: Self-pay | Admitting: Occupational Therapy

## 2020-10-21 DIAGNOSIS — G8111 Spastic hemiplegia affecting right dominant side: Secondary | ICD-10-CM | POA: Diagnosis not present

## 2020-10-21 DIAGNOSIS — R29898 Other symptoms and signs involving the musculoskeletal system: Secondary | ICD-10-CM

## 2020-10-21 DIAGNOSIS — R278 Other lack of coordination: Secondary | ICD-10-CM

## 2020-10-21 DIAGNOSIS — M6281 Muscle weakness (generalized): Secondary | ICD-10-CM

## 2020-10-21 DIAGNOSIS — R2689 Other abnormalities of gait and mobility: Secondary | ICD-10-CM

## 2020-10-21 NOTE — Therapy (Signed)
[place], [place], [address] Phone: [phone]   Fax:  [phone]  Physical Therapy Treatment  Patient Details  Name: Alison Cuevas MRN: [id number] Date of Birth: [birth year] Referring Provider (PT): Alger Simons MD   Encounter Date: 10/21/2020   PT End of Session - 10/21/20 1436    Visit Number 14    Number of Visits 16    Date for PT Re-Evaluation 10/28/20    Authorization Type Medicare A/ [place] NAP 2ndary    Progress Note Due on Visit 20    PT Start Time 1432    PT Stop Time 1517    PT Time Calculation (min) 45 min    Activity Tolerance Patient tolerated treatment well    Behavior During Therapy [facility] for tasks assessed/performed           Past Medical History:  Diagnosis Date  . Allergic rhinitis    maple pollen  . Anxiety   . Arthritis   . Bowel obstruction ([place])   . Chronic low back pain    Dr. Trenton Gammon.  Has had back injection--bp elevated after.  . Chronic pain syndrome   . Chronic renal insufficiency, stage 2 (mild)    GFR 60-70  . DDD (degenerative disc disease), cervical    Hx of ACDF (Dr. [name]).  Followed by Dr. [name].  Also, Dr. [name] do left C7-T1 intralaminal epidural injection.  . Diverticulosis of colon   . DJD (degenerative joint disease)   . Gallstones   . GERD (gastroesophageal reflux disease)   . Herpes zoster 07/08/2014  . 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  Resid R arm and leg weakness + flexion contractures.  . Hypercholesteremia    mild; pt declined statin trial 05/2014--needs recheck lipid panel at first f/u visit in 2017  . Hypertension   . Osteopenia 06/2017   T score -1.4 FRAX 15% / 2%  . Ovarian cyst 2017   82019-robotic assisted bilatel SPO: all path benign.  . Perianal dermatitis    prn cutivate  . Phlebitis   . Right hemiplegia ([place]) 01/2020   R arm and leg  (hemorrhagic L cerebral hemisphere CVA)  . Right knee injury 2018   Patellofemoral crush injury--Dr. [name].  . Stroke ([place])   . Trochanteric bursitis of right hip    Recurrent (injection by Dr. [name] 05/26/15)  . Tympanic membrane rupture, right 12/01/2016   As of 08/2018 pt set for tympanomastoidectomy and STSG (Dr. [name]). Recurrent fungal/bact OE.  . Varicose vein    left leg     Past Surgical History:  Procedure Laterality Date  . ABDOMINAL HYSTERECTOMY  03/2015   TAH.  Last pap 2016.  No hx of abnl paps.  Per GYN, no further pap smears indicated.  . ANTERIOR AND POSTERIOR REPAIR N/A 03/18/2014   Procedure: Cystocele repair with graft, Vault suspension, Rectocele repair;  Surgeon: [name], MD;  [facility];  Service: Urology;  Laterality: N/A;  . CHOLECYSTECTOMY    . CHOLECYSTECTOMY, LAPAROSCOPIC    . COLON SURGERY    . COLONOSCOPY  04/2006; 05/26/16   2007 (Dr. [name]): Normal.  04/2016 (Dr. [name]) normal except diverticulosis and decreased anal sphincter tone.  No repeat colonoscopy is recommended due to age.  . cspine surgery     Dr. Wiliam Ke level ant cerv discectomy /fusion w/plating  . CYSTO N/A 03/18/2014   Procedure:  CYSTO;  Surgeon: Martina SinnerScott A MacDiarmid, MD;  Location[facility];  Service: Urology;  Laterality: N/A;  . DEXA  07/30/2015; 06/2018   2017 and 2020 -->Osteopenia--repeat 2 yrs.  Marland Kitchen. LYSIS OF ADHESION N/A 01/31/2018   Procedure: POSSIBLE LYSIS OF ADHESION;  Surgeon: Adolphus Birchwoodossi, Emma, MD;  Location: [facility];  Service: Gynecology;  Laterality: N/A;  . RECTOCELE REPAIR     with prolasped bledder repair  . RESECTION OF COLON     BENIGN TUMOR  . RIGHT EAR SURGERY  08/23/2017   Dr. 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 right ear surgery  12/12/2018   Right tympanomastoidectomy, ossiculoplasty with partial prosthesis,  split thickness skin graft from postauricular skin 1x1cm, and right tragal cartilage graft 1x1 cm [facility]  . right hemicolectomy for diverticulitis with abscess  1993  . ROBOTIC ASSISTED BILATERAL SALPINGO OOPHERECTOMY Bilateral 01/31/2018   All PATH benign.  Procedure: XI ROBOTIC ASSISTED BILATERAL SALPINGO OOPHORECTOMY;  Surgeon: Adolphus Birchwoodossi, Emma, MD;  Location[facility];  Service: Gynecology;  Laterality: Bilateral;  . TRANSTHORACIC ECHOCARDIOGRAM  01/27/2020   EF 65-70%, NORMAL.  . TUBAL LIGATION      There were no vitals filed for this visit.   Subjective Assessment - 10/21/20 1435    Subjective Patient reports no new issues.    Currently in Pain? Yes    Pain Score 7     Pain Location Arm    Pain Orientation Right    Pain Descriptors / Indicators Aching    Pain Type Acute pain    Pain Frequency Intermittent                             [facility] Adult PT Treatment/Exercise - 10/21/20 1444      Knee/Hip Exercises: Standing   Hip Abduction Both;2 sets;15 reps    Hip Extension Both;2 sets;15 reps    Forward Step Up Both;10 reps;Hand Hold: 2;2 sets;Step Height: 6"    Gait Training 226 feet with quad cane    Other Standing Knee Exercises sidestepping in // bars 4RT      Knee/Hip Exercises: Seated   Sit to Sand 15 reps;with UE support                    PT Short Term Goals - 10/09/20 1118      PT SHORT TERM GOAL #1   Title Patient will be independent with initial HEP and self-management strategies to improve functional outcomes    Time 4    Period Weeks    Status On-going    Target Date 10/16/20      PT SHORT TERM GOAL #2   Title Patient will be able to ambulate at least 100 feet during 2MWT with LRAD to demonstrate improved ability to perform functional mobility and associated tasks.    Baseline 80 ft with HW and SBA-CGA level surfaces    Time 4    Period Weeks    Status On-going    Target Date 10/16/20             PT Long Term Goals - 10/09/20  1118      PT LONG TERM GOAL #1   Title Patient will be able to ambulate at least 225 feet during 2MWT with LRAD to demonstrate improved ability to perform functional mobility  and associated tasks.    Baseline 80 ft with HW and CGA-SBA    Time 8    Period Weeks    Status On-going    Target Date 10/28/20      PT LONG TERM GOAL #2   Title Patient will have equal to or > 4/5 MMT throughout RLE to improve ability to perform functional mobility, stair ambulation and ADLs.    Baseline RLE 3 to 3+/5    Time 8    Period Weeks    Status On-going    Target Date 10/28/20      PT LONG TERM GOAL #3   Title Patient will be able to maintain semi tandem stance >30 seconds on BLEs to improve stability and reduce risk for falls    Baseline 30 sec with LLE in rear maintained with CGA-SBA, unable to maintain with RLE in rear    Time 8    Period Weeks    Status On-going    Target Date 10/28/20                 Plan - 10/21/20 1533    Clinical Impression Statement Patient progressing well toward therapy goals. Showing improvement with ambulation and was able to perform gait training with quad cane today. Patient showing improved stability with forward step ups, and improved ground clearance doing so. Patient does require continue cues for advancing RLE and heel strike during gait. Showing improved activity tolerance and able to increase reps with activity today.    Personal Factors and Comorbidities Age;Comorbidity 1    Comorbidities CVA    Examination-Activity Limitations Locomotion Level;Bed Mobility;Stand;Stairs;Transfers    Examination-Participation Restrictions Community Activity;Yard Work;Laundry;Driving;Cleaning    Stability/Clinical Decision Making Stable/Uncomplicated    Rehab Potential Good    PT Frequency 2x / week    PT Duration 8 weeks    PT Treatment/Interventions ADLs/Self Care Home Management;Aquatic Therapy;Iontophoresis 4mg /ml Dexamethasone;DME Instruction;Balance  training;Passive range of motion;Scar mobilization;Neuromuscular re-education;Gait training;Stair training;Functional mobility training;Patient/family education;Manual techniques;Dry needling;Energy conservation;Manual lymph drainage;Parrafin;Ultrasound;Therapeutic activities;Canalith Repostioning;Moist Heat;Traction;Biofeedback;Cryotherapy;Fluidtherapy;Electrical Stimulation;Contrast Bath;Therapeutic exercise;Orthotic Fit/Training;Compression bandaging;Splinting;Taping;Vasopneumatic Device;Joint Manipulations;Spinal Manipulations;Visual/perceptual remediation/compensation;Vestibular    PT Next Visit Plan Continue standing LE strength, static balance and gait. Progress gait and balance as able. Add amb over hurdles. F/U about ankle brace    PT Home Exercise Plan Eval: seated march, heel/toe raise, LAQs; 09/17/20: STS, standing abduction and marching wiht UE support counter 3/30 staggered stance with support 4/20 bridge, clam 4/27 sidestepping    Consulted and Agree with Plan of Care Patient           Patient will benefit from skilled therapeutic intervention in order to improve the following deficits and impairments:  Pain,Improper body mechanics,Abnormal gait,Decreased balance,Decreased activity tolerance,Decreased range of motion,Decreased strength,Hypomobility,Decreased mobility,Impaired sensation,Difficulty walking  Visit Diagnosis: Other abnormalities of gait and mobility  Muscle weakness (generalized)     Problem List Patient Active Problem List   Diagnosis Date Noted  . Pain in joint of right foot 04/15/2020  . Right spastic hemiparesis ([place]) 03/04/2020  . Chronic otitis externa of right ear 02/20/2020  . Intraparenchymal hemorrhage of brain ([place])   . Delirium   . 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 Stroke (cerebrum) ([place]) 01/26/2020  . Conductive hearing loss of right ear with unrestricted hearing of left ear 05/28/2019  .  Hyponatremia 08/31/2017  . Generalized weakness 08/31/2017  . Dehydration 08/31/2017  . UTI (urinary tract infection) 08/31/2017  . Tympanic membrane rupture, right 12/01/2016  .  Right ovarian cyst 08/22/2016  . Fibromyalgia 06/03/2015  . Cystocele 03/18/2014  . 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 Diverticulosis of colon without hemorrhage 06/12/2013  . Hypertension   . Osteopenia   . Chronic low back pain 05/12/2011  . HYPERCHOLESTEROLEMIA 03/17/2008  . Anxiety state 09/17/2007  . GERD 09/17/2007  . DJD (degenerative joint disease) 09/17/2007    4:24 PM, 10/21/20 Josue Hector PT DPT  Physical Therapist with [facility]  [phone] [place] [address] [place], [place], [address] Phone: [phone]   Fax:  [phone]  Name: Gaelle Adriance MRN: [id number] Date of Birth: [birth year]

## 2020-10-21 NOTE — Therapy (Signed)
[facility] [place], [place], [address] Phone: [phone]   Fax:  [phone]  Occupational Therapy Treatment  Patient Details  Name: Alison Cuevas MRN: [id number] Date of Birth: [birth year] Referring Provider (OT): Alger Simons, MD   Encounter Date: 10/21/2020   OT End of Session - 10/21/20 1420    Visit Number 12    Number of Visits 18    Date for OT Re-Evaluation 11/11/20    Authorization Type double coverage Medicare 80/20% secondary: [employer] Covers what medicare doesn't.    Progress Note Due on Visit 20    OT Start Time 1347    OT Stop Time 1426    OT Time Calculation (min) 39 min    Activity Tolerance Patient tolerated treatment well    Behavior During Therapy WFL for tasks assessed/performed           Past Medical History:  Diagnosis Date  . Allergic rhinitis    maple pollen  . Anxiety   . Arthritis   . Bowel obstruction ([place])   . Chronic low back pain    Dr. Trenton Gammon.  Has had back injection--bp elevated after.  . Chronic pain syndrome   . Chronic renal insufficiency, stage 2 (mild)    GFR 60-70  . DDD (degenerative disc disease), cervical    Hx of ACDF (Dr. [name]).  Followed by Dr. [name].  Also, Dr. [name] do left C7-T1 intralaminal epidural injection.  . Diverticulosis of colon   . DJD (degenerative joint disease)   . Gallstones   . GERD (gastroesophageal reflux disease)   . Herpes zoster 07/08/2014  . 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  Resid R arm and leg weakness + flexion contractures.  . Hypercholesteremia    mild; pt declined statin trial 05/2014--needs recheck lipid panel at first f/u visit in 2017  . Hypertension   . Osteopenia 06/2017   T score -1.4 FRAX 15% / 2%  . Ovarian cyst 2017   82019-robotic assisted bilatel SPO: all path benign.  . Perianal dermatitis    prn cutivate  .  Phlebitis   . Right hemiplegia ([place]) 01/2020   R arm and leg (hemorrhagic L cerebral hemisphere CVA)  . Right knee injury 2018   Patellofemoral crush injury--Dr. [name].  . Stroke ([place])   . Trochanteric bursitis of right hip    Recurrent (injection by Dr. [name] 05/26/15)  . Tympanic membrane rupture, right 12/01/2016   As of 08/2018 pt set for tympanomastoidectomy and STSG (Dr. [name]). Recurrent fungal/bact OE.  . Varicose vein    left leg     Past Surgical History:  Procedure Laterality Date  . ABDOMINAL HYSTERECTOMY  03/2015   TAH.  Last pap 2016.  No hx of abnl paps.  Per GYN, no further pap smears indicated.  . ANTERIOR AND POSTERIOR REPAIR N/A 03/18/2014   Procedure: Cystocele repair with graft, Vault suspension, Rectocele repair;  Surgeon: [name], MD;  Location: [facility];  Service: Urology;  Laterality: N/A;  . CHOLECYSTECTOMY    . CHOLECYSTECTOMY, LAPAROSCOPIC    . COLON SURGERY    . COLONOSCOPY  04/2006; 05/26/16   2007 (Dr. [name]): Normal.  04/2016 (Dr. [name]) normal except diverticulosis and decreased anal sphincter tone.  No repeat colonoscopy is recommended due to age.  . cspine surgery     Dr. Wiliam Ke level ant cerv discectomy /fusion w/plating  . CYSTO  N/A 03/18/2014   Procedure: Kathrene Alu;  Surgeon: [name], MD;  Location: [facility];  Service: Urology;  Laterality: N/A;  . DEXA  07/30/2015; 06/2018   2017 and 2020 -->Osteopenia--repeat 2 yrs.  Marland Kitchen LYSIS OF ADHESION N/A 01/31/2018   Procedure: POSSIBLE LYSIS OF ADHESION;  Surgeon: Everitt Amber, MD;  Location: [facility];  Service: Gynecology;  Laterality: N/A;  . [place]     with prolasped bledder repair  . RESECTION OF COLON     BENIGN TUMOR  . RIGHT EAR SURGERY  08/23/2017   Dr. 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 right ear surgery  12/12/2018    Right tympanomastoidectomy, ossiculoplasty with partial prosthesis, split thickness skin graft from postauricular skin 1x1cm, and right tragal cartilage graft 1x1 cm [facility])  . right hemicolectomy for diverticulitis with abscess  1993  . ROBOTIC ASSISTED BILATERAL SALPINGO OOPHERECTOMY Bilateral 01/31/2018   All PATH benign.  Procedure: XI ROBOTIC ASSISTED BILATERAL SALPINGO OOPHORECTOMY;  Surgeon: Everitt Amber, MD;  Location: [facility];  Service: Gynecology;  Laterality: Bilateral;  . TRANSTHORACIC ECHOCARDIOGRAM  01/27/2020   EF 65-70%, NORMAL.  . TUBAL LIGATION      There were no vitals filed for this visit.   Subjective Assessment - 10/21/20 1344    Subjective  S: My arm is feeling some better.    Currently in Pain? Yes    Pain Score 2     Pain Location Hip    Pain Orientation Right    Pain Descriptors / Indicators Aching;Numbness    Pain Type Acute pain    Pain Radiating Towards none    Pain Onset More than a month ago    Pain Frequency Constant    Aggravating Factors  unsure    Pain Relieving Factors nothing    Effect of Pain on Daily Activities mod effect on ADLs    Multiple Pain Sites No              [facility] OT Assessment - 10/21/20 1342      Assessment   Medical Diagnosis Right side weakness S/P CVA      Precautions   Precautions Fall    Required Braces or Orthoses Other Brace/Splint    Other Brace/Splint Right AFO                    OT Treatments/Exercises (OP) - 10/21/20 1408      Exercises   Exercises Shoulder;Elbow;Wrist;Hand;Theraputty      Shoulder Exercises: Seated   Protraction AROM;10 reps    Horizontal ABduction AROM;10 reps    Flexion AROM;10 reps    Abduction AROM;10 reps      Elbow Exercises   Other elbow exercises Elbow extension stretch; P/ROM, 2x 10"      Additional Elbow Exercises   Theraputty - Flatten yellow - seated    Theraputty - Roll yellow    Theraputty - Grip yellow    Hand Gripper with Large Beads all beads gripper at  29#, horizontal    Hand Gripper with Medium Beads all but 5 beads gripper at 29#, horizontal      Hand Exercises   Other Hand Exercises pt using pvc pipe to cut circles into yellow theraputty.      Manual Therapy   Manual Therapy Myofascial release    Manual therapy comments completed separately from therapeutic  exercises    Myofascial Release myofascial release to right dorsal forearm at lateral and medial epicondyle region to decrease pain and fascial restrictions and improve RUE mobility                    OT Short Term Goals - 10/19/20 1246      OT SHORT TERM GOAL #1   Title Pt will be educated and independent with HEP in order to faciliate progress in therapy and work towards increasing her RUE strength and coordination while increasing her functional performance during ADL tasks.    Time 3    Period Weeks    Target Date 09/23/20             OT Long Term Goals - 10/14/20 1458      OT LONG TERM GOAL #1   Title Patient will increase right shoulder strength and elbow to 5/5 while demonstrating increased shoulder and scapular stability when reaching away from her body/base of support.    Time 6    Period Weeks    Status On-going      OT LONG TERM GOAL #2   Title Patient will increase right hand coordination by completing the 9 hole peg test in 30 seconds or less in order to roll hair with less difficulty.    Time 6    Period Weeks    Status On-going      OT LONG TERM GOAL #3   Title Patient will increase right hand grip strength by 10# and pinch strength by 5# in order to increase ability to grasp and manipulate items with less difficulty.    Time 6    Period Weeks    Status On-going      OT LONG TERM GOAL #4   Title patient will reach highest level of independence with increased confidence and safety awareness while completing all ADL tasks at modified independent level or better while utilizing adaptive equipment and/or compensatory strategies if needed.     Baseline 3/9: Pt is sponge bathing and does not take a shower unless someone is present for supervision.    Time 6    Period Weeks    Status On-going                 Plan - 10/21/20 1417    Clinical Impression Statement A: Continued with myofascial release to forearm and medial epicondyle regions to address pain and fascial restrictions. Continued with RUE A/ROM and grip strengthening. Pt unable to complete all medium beads with hand gripper due to fatigue and thumb pain. Added theraputty task using pvc pipe for sustained grip as well as wrist stability work. Verbal cuing for form and technique.    Body Structure / Function / Physical Skills ADL;UE functional use;FMC;ROM;Proprioception;Coordination;GMC;Strength    Plan P: Overhead lacing, pvc pipe pull with theraputty    OT Home Exercise Plan Eval: proximal shoulder strengthening; 3/18: shoulder A/ROM 3/21: yellow putty - pinch and grip; 3/24: shoulder strengthening (provided A/ROM HEP again)    Consulted and Agree with Plan of Care Patient           Patient will benefit from skilled therapeutic intervention in order to improve the following deficits and impairments:   Body Structure / Function / Physical Skills: ADL,UE functional use,FMC,ROM,Proprioception,Coordination,GMC,Strength       Visit Diagnosis: Other lack of coordination  Other symptoms and signs involving the musculoskeletal system    Problem List Patient Active Problem List  Diagnosis Date Noted  . Pain in joint of right foot 04/15/2020  . Right spastic hemiparesis ([place]) 03/04/2020  . Chronic otitis externa of right ear 02/20/2020  . Intraparenchymal hemorrhage of brain ([place])   . Delirium   . 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 Stroke (cerebrum) ([place]) 01/26/2020  . Conductive hearing loss of right ear with unrestricted hearing of left ear 05/28/2019  . Hyponatremia 08/31/2017  . Generalized weakness  08/31/2017  . Dehydration 08/31/2017  . UTI (urinary tract infection) 08/31/2017  . Tympanic membrane rupture, right 12/01/2016  . Right ovarian cyst 08/22/2016  . Fibromyalgia 06/03/2015  . Cystocele 03/18/2014  . 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 Diverticulosis of colon without hemorrhage 06/12/2013  . Hypertension   . Osteopenia   . Chronic low back pain 05/12/2011  . HYPERCHOLESTEROLEMIA 03/17/2008  . Anxiety state 09/17/2007  . GERD 09/17/2007  . DJD (degenerative joint disease) 09/17/2007   Guadelupe Sabin, OTR/L  [phone] 10/21/2020, 2:27 PM  [place], [place], [address] Phone: [phone]   Fax:  [phone]  Name: Alison Cuevas MRN: [id number] Date of Birth: Feb 20, 1946

## 2020-10-26 ENCOUNTER — Other Ambulatory Visit: Payer: Self-pay

## 2020-10-26 ENCOUNTER — Encounter (HOSPITAL_COMMUNITY): Payer: Self-pay | Admitting: Physical Therapy

## 2020-10-26 ENCOUNTER — Telehealth: Payer: Self-pay | Admitting: *Deleted

## 2020-10-26 ENCOUNTER — Encounter (HOSPITAL_COMMUNITY): Payer: Self-pay

## 2020-10-26 ENCOUNTER — Ambulatory Visit (HOSPITAL_COMMUNITY): Payer: Medicare Other | Attending: Physical Medicine & Rehabilitation | Admitting: Physical Therapy

## 2020-10-26 ENCOUNTER — Ambulatory Visit (HOSPITAL_COMMUNITY): Payer: Medicare Other

## 2020-10-26 DIAGNOSIS — R29898 Other symptoms and signs involving the musculoskeletal system: Secondary | ICD-10-CM | POA: Insufficient documentation

## 2020-10-26 DIAGNOSIS — R278 Other lack of coordination: Secondary | ICD-10-CM

## 2020-10-26 DIAGNOSIS — M6281 Muscle weakness (generalized): Secondary | ICD-10-CM

## 2020-10-26 DIAGNOSIS — R2689 Other abnormalities of gait and mobility: Secondary | ICD-10-CM | POA: Diagnosis not present

## 2020-10-26 NOTE — Therapy (Signed)
[place], [place], [address] Phone: [phone]   Fax:  [phone]  Occupational Therapy Treatment  Patient Details  Name: Alison Cuevas MRN: [id number] Date of Birth: [birth year] Referring Provider (OT): Alger Simons, MD   Encounter Date: 10/26/2020   OT End of Session - 10/26/20 1535    Visit Number 13    Number of Visits 18    Date for OT Re-Evaluation 11/11/20    Authorization Type double coverage Medicare 80/20% secondary: [employer] Covers what medicare doesn't.    Progress Note Due on Visit 20    OT Start Time 1400    OT Stop Time 1438    OT Time Calculation (min) 38 min    Activity Tolerance Patient tolerated treatment well    Behavior During Therapy WFL for tasks assessed/performed           Past Medical History:  Diagnosis Date  . Allergic rhinitis    maple pollen  . Anxiety   . Arthritis   . Bowel obstruction ([place])   . Chronic low back pain    Dr. Trenton Gammon.  Has had back injection--bp elevated after.  . Chronic pain syndrome   . Chronic renal insufficiency, stage 2 (mild)    GFR 60-70  . DDD (degenerative disc disease), cervical    Hx of ACDF (Dr. [name]).  Followed by Dr. [name].  Also, Dr. [name] do left C7-T1 intralaminal epidural injection.  . Diverticulosis of colon   . DJD (degenerative joint disease)   . Gallstones   . GERD (gastroesophageal reflux disease)   . Herpes zoster 07/08/2014  . 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  Resid R arm and leg weakness + flexion contractures.  . Hypercholesteremia    mild; pt declined statin trial 05/2014--needs recheck lipid panel at first f/u visit in 2017  . Hypertension   . Osteopenia 06/2017   T score -1.4 FRAX 15% / 2%  . Ovarian cyst 2017   82019-robotic assisted bilatel SPO: all path benign.  . Perianal dermatitis    prn cutivate  .  Phlebitis   . Right hemiplegia ([place]) 01/2020   R arm and leg (hemorrhagic L cerebral hemisphere CVA)  . Right knee injury 2018   Patellofemoral crush injury--Dr. [name].  . Stroke ([place])   . Trochanteric bursitis of right hip    Recurrent (injection by Dr. [name] 05/26/15)  . Tympanic membrane rupture, right 12/01/2016   As of 08/2018 pt set for tympanomastoidectomy and STSG (Dr. [name]). Recurrent fungal/bact OE.  . Varicose vein    left leg     Past Surgical History:  Procedure Laterality Date  . ABDOMINAL HYSTERECTOMY  03/2015   TAH.  Last pap 2016.  No hx of abnl paps.  Per GYN, no further pap smears indicated.  . ANTERIOR AND POSTERIOR REPAIR N/A 03/18/2014   Procedure: Cystocele repair with graft, Vault suspension, Rectocele repair;  Surgeon: [name], MD;  [facility];  Service: Urology;  Laterality: N/A;  . CHOLECYSTECTOMY    . CHOLECYSTECTOMY, LAPAROSCOPIC    . COLON SURGERY    . COLONOSCOPY  04/2006; 05/26/16   2007 (Dr. [name]): Normal.  04/2016 (Dr. [name]) normal except diverticulosis and decreased anal sphincter tone.  No repeat colonoscopy is recommended due to age.  . cspine surgery     Dr. Wiliam Ke level ant cerv discectomy /fusion w/plating  . CYSTO  N/A 03/18/2014   Procedure: Kathrene Alu;  Surgeon: [name], MD;  [facility];  Service: Urology;  Laterality: N/A;  . DEXA  07/30/2015; 06/2018   2017 and 2020 -->Osteopenia--repeat 2 yrs.  Marland Kitchen LYSIS OF ADHESION N/A 01/31/2018   Procedure: POSSIBLE LYSIS OF ADHESION;  Surgeon: Everitt Amber, MD;  [facility];  Service: Gynecology;  Laterality: N/A;  . [place]     with prolasped bledder repair  . RESECTION OF COLON     BENIGN TUMOR  . RIGHT EAR SURGERY  08/23/2017   Dr. 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 right ear surgery  12/12/2018    Right tympanomastoidectomy, ossiculoplasty with partial prosthesis, split thickness skin graft from postauricular skin 1x1cm, and right tragal cartilage graft 1x1 cm [facility])  . right hemicolectomy for diverticulitis with abscess  1993  . ROBOTIC ASSISTED BILATERAL SALPINGO OOPHERECTOMY Bilateral 01/31/2018   All PATH benign.  Procedure: XI ROBOTIC ASSISTED BILATERAL SALPINGO OOPHORECTOMY;  Surgeon: Everitt Amber, MD;  [facility];  Service: Gynecology;  Laterality: Bilateral;  . TRANSTHORACIC ECHOCARDIOGRAM  01/27/2020   EF 65-70%, NORMAL.  . TUBAL LIGATION      There were no vitals filed for this visit.       [facility] OT Assessment - 10/26/20 1408      Assessment   Medical Diagnosis Right side weakness S/P CVA      Precautions   Precautions Fall    Required Braces or Orthoses Other Brace/Splint    Other Brace/Splint Right AFO                    OT Treatments/Exercises (OP) - 10/26/20 1410      Exercises   Exercises Shoulder      Shoulder Exercises: Seated   Protraction AROM;5 reps;Strengthening;10 reps    Protraction Weight (lbs) 1    Horizontal ABduction AROM;5 reps;Strengthening;10 reps    Horizontal ABduction Weight (lbs) 1    External Rotation AROM;5 reps;Strengthening;10 reps    External Rotation Weight (lbs) 1    Internal Rotation AROM;5 reps;Strengthening;10 reps    Internal Rotation Weight (lbs) 1    Flexion AROM;5 reps;Strengthening;10 reps    Flexion Weight (lbs) 1    Abduction AROM;5 reps;Strengthening;10 reps    ABduction Weight (lbs) 1      Shoulder Exercises: ROM/Strengthening   Over Head Lace seated. Laced from top down then unlaced with min physical assistance to create a large enough loop to pull.    Proximal Shoulder Strengthening, Supine A/ROM 10X no rest breaks                    OT Short Term Goals - 10/19/20 1246      OT SHORT TERM GOAL #1   Title Pt will be educated and independent with HEP in order to faciliate progress  in therapy and work towards increasing her RUE strength and coordination while increasing her functional performance during ADL tasks.    Time 3    Period Weeks    Target Date 09/23/20             OT Long Term Goals - 10/14/20 1458      OT LONG TERM GOAL #1   Title Patient will increase right shoulder strength and elbow to 5/5 while demonstrating increased shoulder and scapular stability when reaching  away from her body/base of support.    Time 6    Period Weeks    Status On-going      OT LONG TERM GOAL #2   Title Patient will increase right hand coordination by completing the 9 hole peg test in 30 seconds or less in order to roll hair with less difficulty.    Time 6    Period Weeks    Status On-going      OT LONG TERM GOAL #3   Title Patient will increase right hand grip strength by 10# and pinch strength by 5# in order to increase ability to grasp and manipulate items with less difficulty.    Time 6    Period Weeks    Status On-going      OT LONG TERM GOAL #4   Title patient will reach highest level of independence with increased confidence and safety awareness while completing all ADL tasks at modified independent level or better while utilizing adaptive equipment and/or compensatory strategies if needed.    Baseline 3/9: Pt is sponge bathing and does not take a shower unless someone is present for supervision.    Time 6    Period Weeks    Status On-going                 Plan - 10/26/20 1538    Clinical Impression Statement A:Transferred to standard chair with arm rests using a towel roll and pillow behind back and feet on floor at after PT session to encourage good seated posture and to decrease hip pain. Patient was able to complete UB strengthening using 1# weight with VC for form and technique. Did not show any muscle fatigue during completion although there were reports of it when finished. Patient completed overhead lacing seated to focus on shoulder stability  and motor coordination. Max difficulty to complete with multiple rest breaks taken as needed due to fatigue. Provided min physical assistance when unlacing to loosen loop enough to pull tubing out of chain.    Body Structure / Function / Physical Skills ADL;UE functional use;FMC;ROM;Proprioception;Coordination;GMC;Strength    Plan P: PVC pipe pull with theraputty.    Consulted and Agree with Plan of Care Patient           Patient will benefit from skilled therapeutic intervention in order to improve the following deficits and impairments:   Body Structure / Function / Physical Skills: ADL,UE functional use,FMC,ROM,Proprioception,Coordination,GMC,Strength       Visit Diagnosis: Other lack of coordination  Other symptoms and signs involving the musculoskeletal system    Problem List Patient Active Problem List   Diagnosis Date Noted  . Pain in joint of right foot 04/15/2020  . Right spastic hemiparesis ([place]) 03/04/2020  . Chronic otitis externa of right ear 02/20/2020  . Intraparenchymal hemorrhage of brain ([place])   . Delirium   . 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 Stroke (cerebrum) ([place]) 01/26/2020  . Conductive hearing loss of right ear with unrestricted hearing of left ear 05/28/2019  . Hyponatremia 08/31/2017  . Generalized weakness 08/31/2017  . Dehydration 08/31/2017  . UTI (urinary tract infection) 08/31/2017  . Tympanic membrane rupture, right 12/01/2016  . Right ovarian cyst 08/22/2016  . Fibromyalgia 06/03/2015  . Cystocele 03/18/2014  . 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 Diverticulosis of colon without hemorrhage 06/12/2013  . Hypertension   . Osteopenia   . Chronic low back pain 05/12/2011  . HYPERCHOLESTEROLEMIA 03/17/2008  . Anxiety state  09/17/2007  . GERD 09/17/2007  . DJD (degenerative joint disease) 09/17/2007    Ailene Ravel, OTR/L,CBIS  [phone]  10/26/2020, 3:47 PM  [facility] [place] [address] [place], [place], [address] Phone: [phone]   Fax:  [phone]  Name: Alison Cuevas MRN: [id number] Date of Birth: Aug 24, 1945

## 2020-10-26 NOTE — Therapy (Signed)
[place], [place], [address] Phone: [phone]   Fax:  [phone]  Physical Therapy Treatment  Patient Details  Name: Alison Cuevas MRN: [id number] Date of Birth: May 07, 1946 Referring Provider (PT): Alger Simons MD   Encounter Date: 10/26/2020   PT End of Session - 10/26/20 1312    Visit Number 15    Number of Visits 16    Date for PT Re-Evaluation 10/28/20    Authorization Type Medicare A/ [place] NAP 2ndary    Progress Note Due on Visit 20    PT Start Time 1315    PT Stop Time 1355    PT Time Calculation (min) 40 min    Activity Tolerance Patient tolerated treatment well    Behavior During Therapy [facility] for tasks assessed/performed           Past Medical History:  Diagnosis Date  . Allergic rhinitis    maple pollen  . Anxiety   . Arthritis   . Bowel obstruction ([place])   . Chronic low back pain    Dr. Trenton Gammon.  Has had back injection--bp elevated after.  . Chronic pain syndrome   . Chronic renal insufficiency, stage 2 (mild)    GFR 60-70  . DDD (degenerative disc disease), cervical    Hx of ACDF (Dr. [name]).  Followed by Dr. [name].  Also, Dr. [name] do left C7-T1 intralaminal epidural injection.  . Diverticulosis of colon   . DJD (degenerative joint disease)   . Gallstones   . GERD (gastroesophageal reflux disease)   . Herpes zoster 07/08/2014  . 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  Resid R arm and leg weakness + flexion contractures.  . Hypercholesteremia    mild; pt declined statin trial 05/2014--needs recheck lipid panel at first f/u visit in 2017  . Hypertension   . Osteopenia 06/2017   T score -1.4 FRAX 15% / 2%  . Ovarian cyst 2017   82019-robotic assisted bilatel SPO: all path benign.  . Perianal dermatitis    prn cutivate  . Phlebitis   . Right hemiplegia ([place]) 01/2020   R arm and leg  (hemorrhagic L cerebral hemisphere CVA)  . Right knee injury 2018   Patellofemoral crush injury--Dr. [name].  . Stroke ([place])   . Trochanteric bursitis of right hip    Recurrent (injection by Dr. [name] 05/26/15)  . Tympanic membrane rupture, right 12/01/2016   As of 08/2018 pt set for tympanomastoidectomy and STSG (Dr. [name]). Recurrent fungal/bact OE.  . Varicose vein    left leg     Past Surgical History:  Procedure Laterality Date  . ABDOMINAL HYSTERECTOMY  03/2015   TAH.  Last pap 2016.  No hx of abnl paps.  Per GYN, no further pap smears indicated.  . ANTERIOR AND POSTERIOR REPAIR N/A 03/18/2014   Procedure: Cystocele repair with graft, Vault suspension, Rectocele repair;  Surgeon: [name], MD;  Location: [facility];  Service: Urology;  Laterality: N/A;  . CHOLECYSTECTOMY    . CHOLECYSTECTOMY, LAPAROSCOPIC    . COLON SURGERY    . COLONOSCOPY  04/2006; 05/26/16   2007 (Dr. [name]): Normal.  04/2016 (Dr. [name]) normal except diverticulosis and decreased anal sphincter tone.  No repeat colonoscopy is recommended due to age.  . cspine surgery     Dr. Wiliam Ke level ant cerv discectomy /fusion w/plating  . CYSTO N/A 03/18/2014   Procedure:  CYSTO;  Surgeon: [name], MD;  [facility];  Service: Urology;  Laterality: N/A;  . DEXA  07/30/2015; 06/2018   2017 and 2020 -->Osteopenia--repeat 2 yrs.  Marland Kitchen LYSIS OF ADHESION N/A 01/31/2018   Procedure: POSSIBLE LYSIS OF ADHESION;  Surgeon: Everitt Amber, MD;  [facility];  Service: Gynecology;  Laterality: N/A;  . [place]     with prolasped bledder repair  . RESECTION OF COLON     BENIGN TUMOR  . RIGHT EAR SURGERY  08/23/2017   Dr. 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 right ear surgery  12/12/2018   Right tympanomastoidectomy, ossiculoplasty with partial prosthesis,  split thickness skin graft from postauricular skin 1x1cm, and right tragal cartilage graft 1x1 cm [facility])  . right hemicolectomy for diverticulitis with abscess  1993  . ROBOTIC ASSISTED BILATERAL SALPINGO OOPHERECTOMY Bilateral 01/31/2018   All PATH benign.  Procedure: XI ROBOTIC ASSISTED BILATERAL SALPINGO OOPHORECTOMY;  Surgeon: Everitt Amber, MD;  [facility];  Service: Gynecology;  Laterality: Bilateral;  . TRANSTHORACIC ECHOCARDIOGRAM  01/27/2020   EF 65-70%, NORMAL.  . TUBAL LIGATION      There were no vitals filed for this visit.   Subjective Assessment - 10/26/20 1319    Subjective States since Friday been having left groin pain and now mostly just when she picks her leg. States her pain is 9/10 with movement. States that  nothing really makes it feel better.    Currently in Pain? Yes    Pain Score 9     Pain Location Groin    Pain Orientation Left    Pain Descriptors / Indicators Sharp    Pain Type Acute pain    Aggravating Factors  with movement              [facility] PT Assessment - 10/26/20 0001      Assessment   Medical Diagnosis Right side weakness S/P CVA                         [facility] Adult PT Treatment/Exercise - 10/26/20 0001      Ambulation/Gait   Ambulation/Gait Yes    Ambulation/Gait Assistance 4: Min guard    Ambulation Distance (Feet) 226 Feet    Assistive device Hemi-walker    Gait Pattern Decreased arm swing - right;Decreased step length - left;Decreased stride length;Decreased hip/knee flexion - right    Gait Comments cues to stand upright and squeeze butt      Knee/Hip Exercises: Seated   Other Seated Knee/Hip Exercises posture exercises on blue dyno disc and assist to anterior pelvic tilt with belts- pressing into right Le - 15 minutes total      Manual Therapy   Manual Therapy Soft tissue mobilization    Manual therapy comments completed separately from therapeutic exercises    Soft tissue mobilization STM adn IASTM to left hip  flexors, groina nd quad - toelrated well - contract relax with STM/trigger point release - tolerated well                    PT Short Term Goals - 10/09/20 1118      PT SHORT TERM GOAL #1   Title Patient will be independent with initial HEP and self-management strategies to improve functional outcomes    Time 4  Period Weeks    Status On-going    Target Date 10/16/20      PT SHORT TERM GOAL #2   Title Patient will be able to ambulate at least 100 feet during 2MWT with LRAD to demonstrate improved ability to perform functional mobility and associated tasks.    Baseline 80 ft with HW and SBA-CGA level surfaces    Time 4    Period Weeks    Status On-going    Target Date 10/16/20             PT Long Term Goals - 10/09/20 1118      PT LONG TERM GOAL #1   Title Patient will be able to ambulate at least 225 feet during 2MWT with LRAD to demonstrate improved ability to perform functional mobility and associated tasks.    Baseline 80 ft with HW and CGA-SBA    Time 8    Period Weeks    Status On-going    Target Date 10/28/20      PT LONG TERM GOAL #2   Title Patient will have equal to or > 4/5 MMT throughout RLE to improve ability to perform functional mobility, stair ambulation and ADLs.    Baseline RLE 3 to 3+/5    Time 8    Period Weeks    Status On-going    Target Date 10/28/20      PT LONG TERM GOAL #3   Title Patient will be able to maintain semi tandem stance >30 seconds on BLEs to improve stability and reduce risk for falls    Baseline 30 sec with LLE in rear maintained with CGA-SBA, unable to maintain with RLE in rear    Time 8    Period Weeks    Status On-going    Target Date 10/28/20                 Plan - 10/26/20 1443    Clinical Impression Statement Patient with reports of increased pain in left hip/groin region. Educated patient on anatomy and possible causes of  this pain. Patient with tendency to posterior tilt while standing and  sitting and favors hip flexors over glutes with walking. Session focused on education, posterior and anterior pelvic tilt with sitting and standing. Took patient out of transport chair for all exercises secondary to favoring posterior pelvic tilt in this position. Encouraged patient to massage this area at home. Will continue with current POC as tolerated.    Personal Factors and Comorbidities Age;Comorbidity 1    Comorbidities CVA    Examination-Activity Limitations Locomotion Level;Bed Mobility;Stand;Stairs;Transfers    Examination-Participation Restrictions Community Activity;Yard Work;Laundry;Driving;Cleaning    Stability/Clinical Decision Making Stable/Uncomplicated    Rehab Potential Good    PT Frequency 2x / week    PT Duration 8 weeks    PT Treatment/Interventions ADLs/Self Care Home Management;Aquatic Therapy;Iontophoresis 4mg /ml Dexamethasone;DME Instruction;Balance training;Passive range of motion;Scar mobilization;Neuromuscular re-education;Gait training;Stair training;Functional mobility training;Patient/family education;Manual techniques;Dry needling;Energy conservation;Manual lymph drainage;Parrafin;Ultrasound;Therapeutic activities;Canalith Repostioning;Moist Heat;Traction;Biofeedback;Cryotherapy;Fluidtherapy;Electrical Stimulation;Contrast Bath;Therapeutic exercise;Orthotic Fit/Training;Compression bandaging;Splinting;Taping;Vasopneumatic Device;Joint Manipulations;Spinal Manipulations;Visual/perceptual remediation/compensation;Vestibular    PT Next Visit Plan Continue standing LE strength, static balance and gait. Progress gait and balance as able. Add amb over hurdles. F/U about ankle brace    PT Home Exercise Plan Eval: seated march, heel/toe raise, LAQs; 09/17/20: STS, standing abduction and marching wiht UE support counter 3/30 staggered stance with support 4/20 bridge, clam 4/27 sidestepping    Consulted and Agree with Plan of Care Patient  Patient will benefit from  skilled therapeutic intervention in order to improve the following deficits and impairments:  Pain,Improper body mechanics,Abnormal gait,Decreased balance,Decreased activity tolerance,Decreased range of motion,Decreased strength,Hypomobility,Decreased mobility,Impaired sensation,Difficulty walking  Visit Diagnosis: Other lack of coordination  Other symptoms and signs involving the musculoskeletal system  Other abnormalities of gait and mobility  Muscle weakness (generalized)     Problem List Patient Active Problem List   Diagnosis Date Noted  . Pain in joint of right foot 04/15/2020  . Right spastic hemiparesis ([place]) 03/04/2020  . Chronic otitis externa of right ear 02/20/2020  . Intraparenchymal hemorrhage of brain ([place])   . Delirium   . 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 Stroke (cerebrum) ([place]) 01/26/2020  . Conductive hearing loss of right ear with unrestricted hearing of left ear 05/28/2019  . Hyponatremia 08/31/2017  . Generalized weakness 08/31/2017  . Dehydration 08/31/2017  . UTI (urinary tract infection) 08/31/2017  . Tympanic membrane rupture, right 12/01/2016  . Right ovarian cyst 08/22/2016  . Fibromyalgia 06/03/2015  . Cystocele 03/18/2014  . 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 Diverticulosis of colon without hemorrhage 06/12/2013  . Hypertension   . Osteopenia   . Chronic low back pain 05/12/2011  . HYPERCHOLESTEROLEMIA 03/17/2008  . Anxiety state 09/17/2007  . GERD 09/17/2007  . DJD (degenerative joint disease) 09/17/2007   2:44 PM, 10/26/20 Jerene Pitch, DPT Physical Therapy with [facility]  [phone] office  [place] [address] [place], [place], [address] Phone: [phone]   Fax:  [phone]  Name: Hertha Camposano MRN: [id number] Date of Birth: [birth year]

## 2020-10-26 NOTE — Telephone Encounter (Signed)
Patient called was prescribed nystatin ointment to use externally at office visit on 10/20/20. Patient reports she uses a pad with helps with urinary incontinence she has at times. She was worried that using the ointment along with wearing pad would "harm her" I explained no harm with using pad. Patient reports directions on nystatin ointment said leave area open. I explained to try to keep area dry as much as she can, but okay to use the cream. Patient verbalized she understood.

## 2020-10-27 DIAGNOSIS — H90A31 Mixed conductive and sensorineural hearing loss, unilateral, right ear with restricted hearing on the contralateral side: Secondary | ICD-10-CM | POA: Diagnosis not present

## 2020-10-27 DIAGNOSIS — H7291 Unspecified perforation of tympanic membrane, right ear: Secondary | ICD-10-CM | POA: Diagnosis not present

## 2020-10-27 DIAGNOSIS — H90A22 Sensorineural hearing loss, unilateral, left ear, with restricted hearing on the contralateral side: Secondary | ICD-10-CM | POA: Diagnosis not present

## 2020-10-27 DIAGNOSIS — H903 Sensorineural hearing loss, bilateral: Secondary | ICD-10-CM | POA: Diagnosis not present

## 2020-10-27 DIAGNOSIS — Z9889 Other specified postprocedural states: Secondary | ICD-10-CM | POA: Diagnosis not present

## 2020-10-27 DIAGNOSIS — H9071 Mixed conductive and sensorineural hearing loss, unilateral, right ear, with unrestricted hearing on the contralateral side: Secondary | ICD-10-CM | POA: Diagnosis not present

## 2020-10-28 ENCOUNTER — Other Ambulatory Visit (HOSPITAL_COMMUNITY): Payer: Self-pay | Admitting: Nurse Practitioner

## 2020-10-28 ENCOUNTER — Encounter (HOSPITAL_COMMUNITY): Payer: Self-pay | Admitting: Physical Therapy

## 2020-10-28 ENCOUNTER — Ambulatory Visit (HOSPITAL_COMMUNITY): Payer: Medicare Other | Admitting: Occupational Therapy

## 2020-10-28 ENCOUNTER — Encounter (HOSPITAL_COMMUNITY): Payer: Self-pay | Admitting: Occupational Therapy

## 2020-10-28 ENCOUNTER — Other Ambulatory Visit: Payer: Self-pay

## 2020-10-28 ENCOUNTER — Ambulatory Visit (HOSPITAL_COMMUNITY): Payer: Medicare Other | Admitting: Physical Therapy

## 2020-10-28 DIAGNOSIS — R278 Other lack of coordination: Secondary | ICD-10-CM

## 2020-10-28 DIAGNOSIS — R2689 Other abnormalities of gait and mobility: Secondary | ICD-10-CM

## 2020-10-28 DIAGNOSIS — Z1231 Encounter for screening mammogram for malignant neoplasm of breast: Secondary | ICD-10-CM

## 2020-10-28 DIAGNOSIS — R29898 Other symptoms and signs involving the musculoskeletal system: Secondary | ICD-10-CM

## 2020-10-28 DIAGNOSIS — M6281 Muscle weakness (generalized): Secondary | ICD-10-CM

## 2020-10-28 NOTE — Therapy (Signed)
[place], [place], [address] Phone: [phone]   Fax:  [phone]  Occupational Therapy Treatment  Patient Details  Name: Alison Cuevas MRN: [id number] Date of Birth: [birth year] Referring Provider (OT): Alger Simons, MD   Encounter Date: 10/28/2020   OT End of Session - 10/28/20 1424    Visit Number 14    Number of Visits 18    Date for OT Re-Evaluation 11/11/20    Authorization Type double coverage Medicare 80/20% secondary: [employer] Covers what medicare doesn't.    Progress Note Due on Visit 20    OT Start Time 1350    OT Stop Time 1428    OT Time Calculation (min) 38 min    Activity Tolerance Patient tolerated treatment well    Behavior During Therapy WFL for tasks assessed/performed           Past Medical History:  Diagnosis Date  . Allergic rhinitis    maple pollen  . Anxiety   . Arthritis   . Bowel obstruction ([place])   . Chronic low back pain    Dr. Trenton Gammon.  Has had back injection--bp elevated after.  . Chronic pain syndrome   . Chronic renal insufficiency, stage 2 (mild)    GFR 60-70  . DDD (degenerative disc disease), cervical    Hx of ACDF (Dr. [name]).  Followed by Dr. [name].  Also, Dr. [name] do left C7-T1 intralaminal epidural injection.  . Diverticulosis of colon   . DJD (degenerative joint disease)   . Gallstones   . GERD (gastroesophageal reflux disease)   . Herpes zoster 07/08/2014  . 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  Resid R arm and leg weakness + flexion contractures.  . Hypercholesteremia    mild; pt declined statin trial 05/2014--needs recheck lipid panel at first f/u visit in 2017  . Hypertension   . Osteopenia 06/2017   T score -1.4 FRAX 15% / 2%  . Ovarian cyst 2017   82019-robotic assisted bilatel SPO: all path benign.  . Perianal dermatitis    prn cutivate  .  Phlebitis   . Right hemiplegia ([place]) 01/2020   R arm and leg (hemorrhagic L cerebral hemisphere CVA)  . Right knee injury 2018   Patellofemoral crush injury--Dr. [name].  . Stroke ([place])   . Trochanteric bursitis of right hip    Recurrent (injection by Dr. [name] 05/26/15)  . Tympanic membrane rupture, right 12/01/2016   As of 08/2018 pt set for tympanomastoidectomy and STSG (Dr. [name]). Recurrent fungal/bact OE.  . Varicose vein    left leg     Past Surgical History:  Procedure Laterality Date  . ABDOMINAL HYSTERECTOMY  03/2015   TAH.  Last pap 2016.  No hx of abnl paps.  Per GYN, no further pap smears indicated.  . ANTERIOR AND POSTERIOR REPAIR N/A 03/18/2014   Procedure: Cystocele repair with graft, Vault suspension, Rectocele repair;  Surgeon: [name], MD;  [facility];  Service: Urology;  Laterality: N/A;  . CHOLECYSTECTOMY    . CHOLECYSTECTOMY, LAPAROSCOPIC    . COLON SURGERY    . COLONOSCOPY  04/2006; 05/26/16   2007 (Dr. [name]): Normal.  04/2016 (Dr. [name]) normal except diverticulosis and decreased anal sphincter tone.  No repeat colonoscopy is recommended due to age.  . cspine surgery     Dr. Wiliam Ke level ant cerv discectomy /fusion w/plating  . CYSTO  N/A 03/18/2014   Procedure: Kathrene Alu;  Surgeon: [name], MD;  [facility];  Service: Urology;  Laterality: N/A;  . DEXA  07/30/2015; 06/2018   2017 and 2020 -->Osteopenia--repeat 2 yrs.  Marland Kitchen LYSIS OF ADHESION N/A 01/31/2018   Procedure: POSSIBLE LYSIS OF ADHESION;  Surgeon: Everitt Amber, MD;  [facility];  Service: Gynecology;  Laterality: N/A;  . [place]     with prolasped bledder repair  . RESECTION OF COLON     BENIGN TUMOR  . RIGHT EAR SURGERY  08/23/2017   Dr. 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 right ear surgery  12/12/2018    Right tympanomastoidectomy, ossiculoplasty with partial prosthesis, split thickness skin graft from postauricular skin 1x1cm, and right tragal cartilage graft 1x1 cm [facility])  . right hemicolectomy for diverticulitis with abscess  1993  . ROBOTIC ASSISTED BILATERAL SALPINGO OOPHERECTOMY Bilateral 01/31/2018   All PATH benign.  Procedure: XI ROBOTIC ASSISTED BILATERAL SALPINGO OOPHORECTOMY;  Surgeon: Everitt Amber, MD;  [facility];  Service: Gynecology;  Laterality: Bilateral;  . TRANSTHORACIC ECHOCARDIOGRAM  01/27/2020   EF 65-70%, NORMAL.  . TUBAL LIGATION      There were no vitals filed for this visit.   Subjective Assessment - 10/28/20 1348    Subjective  S: This arm feels stiff and I just can't lift it up as high.    Currently in Pain? Yes    Pain Score 5     Pain Location Leg    Pain Orientation Left    Pain Descriptors / Indicators Cramping;Aching    Pain Type Acute pain;Chronic pain    Pain Radiating Towards none    Pain Onset More than a month ago    Pain Frequency Intermittent    Aggravating Factors  movement    Pain Relieving Factors rest    Effect of Pain on Daily Activities limits    Multiple Pain Sites No              [facility] OT Assessment - 10/28/20 1348      Assessment   Medical Diagnosis Right side weakness S/P CVA      Precautions   Precautions Fall                    OT Treatments/Exercises (OP) - 10/28/20 1352      Exercises   Exercises Shoulder;Hand      Shoulder Exercises: Seated   Protraction Strengthening;10 reps    Protraction Weight (lbs) 1    Horizontal ABduction Strengthening;10 reps    Horizontal ABduction Weight (lbs) 1    External Rotation Strengthening;10 reps    External Rotation Weight (lbs) 1    Internal Rotation Strengthening;10 reps    Internal Rotation Weight (lbs) 1    Flexion Strengthening;10 reps    Flexion Weight (lbs) 1    Abduction Strengthening;10 reps    ABduction Weight (lbs) 1      Hand Exercises    Hand Gripper with Medium Beads 5 beads gripper at 29#, horizontal    Other Hand Exercises pt using pvc pipe to pull putty towards her working on RUE strength and wrist stability      Neurological Re-education Exercises   Theraputty - Flatten red-seated  OT Short Term Goals - 10/19/20 1246      OT SHORT TERM GOAL #1   Title Pt will be educated and independent with HEP in order to faciliate progress in therapy and work towards increasing her RUE strength and coordination while increasing her functional performance during ADL tasks.    Time 3    Period Weeks    Target Date 09/23/20             OT Long Term Goals - 10/14/20 1458      OT LONG TERM GOAL #1   Title Patient will increase right shoulder strength and elbow to 5/5 while demonstrating increased shoulder and scapular stability when reaching away from her body/base of support.    Time 6    Period Weeks    Status On-going      OT LONG TERM GOAL #2   Title Patient will increase right hand coordination by completing the 9 hole peg test in 30 seconds or less in order to roll hair with less difficulty.    Time 6    Period Weeks    Status On-going      OT LONG TERM GOAL #3   Title Patient will increase right hand grip strength by 10# and pinch strength by 5# in order to increase ability to grasp and manipulate items with less difficulty.    Time 6    Period Weeks    Status On-going      OT LONG TERM GOAL #4   Title patient will reach highest level of independence with increased confidence and safety awareness while completing all ADL tasks at modified independent level or better while utilizing adaptive equipment and/or compensatory strategies if needed.    Baseline 3/9: Pt is sponge bathing and does not take a shower unless someone is present for supervision.    Time 6    Period Weeks    Status On-going                 Plan - 10/28/20 1419    Clinical Impression Statement A: Pt  fatigued after PT session today. Completed RUE strengthening and grip strengthening this session, occasional rest breaks as needed. Attempted hand gripper task however pt only able to complete 5 beads due to thumb pain and fatigue. Added theraputty task working on wrist stability and grip strength.    Body Structure / Function / Physical Skills ADL;UE functional use;FMC;ROM;Proprioception;Coordination;GMC;Strength    Plan P: Fine motor task, pinch task    OT Home Exercise Plan Eval: proximal shoulder strengthening; 3/18: shoulder A/ROM 3/21: yellow putty - pinch and grip; 3/24: shoulder strengthening (provided A/ROM HEP again)    Consulted and Agree with Plan of Care Patient           Patient will benefit from skilled therapeutic intervention in order to improve the following deficits and impairments:   Body Structure / Function / Physical Skills: ADL,UE functional use,FMC,ROM,Proprioception,Coordination,GMC,Strength       Visit Diagnosis: Other lack of coordination  Other symptoms and signs involving the musculoskeletal system    Problem List Patient Active Problem List   Diagnosis Date Noted  . Pain in joint of right foot 04/15/2020  . Right spastic hemiparesis ([place]) 03/04/2020  . Chronic otitis externa of right ear 02/20/2020  . Intraparenchymal hemorrhage of brain ([place])   . Delirium   . 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 Stroke (cerebrum) ([place]) 01/26/2020  . Conductive hearing loss  of right ear with unrestricted hearing of left ear 05/28/2019  . Hyponatremia 08/31/2017  . Generalized weakness 08/31/2017  . Dehydration 08/31/2017  . UTI (urinary tract infection) 08/31/2017  . Tympanic membrane rupture, right 12/01/2016  . Right ovarian cyst 08/22/2016  . Fibromyalgia 06/03/2015  . Cystocele 03/18/2014  . 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 Diverticulosis of colon without hemorrhage 06/12/2013  . Hypertension   . Osteopenia    . Chronic low back pain 05/12/2011  . HYPERCHOLESTEROLEMIA 03/17/2008  . Anxiety state 09/17/2007  . GERD 09/17/2007  . DJD (degenerative joint disease) 09/17/2007   Guadelupe Sabin, OTR/L  [phone] 10/28/2020, 2:29 PM  [place] [address] [place], [place], [address] Phone: [phone]   Fax:  [phone]  Name: Alison Cuevas MRN: [id number] Date of Birth: 24-Jun-1946

## 2020-10-29 NOTE — Progress Notes (Signed)
   10/28/20 0001  Assessment  Medical Diagnosis Right side weakness S/P CVA  Referring Provider (PT) Alger Simons MD  Onset Date/Surgical Date 02/14/21  Prior Therapy Pt received OT and PT [facility] at [facility], discharged to SNF, discharged home and received Home Health therapy.  Precautions  Precautions Fall  Restrictions  Weight Bearing Restrictions No  Balance Screen  Has the patient fallen in the past 6 months No  [place] residence  Living Arrangements Other (Comment) (Is now widowed, but her children stay with her off and on for support)  Prior Function  Level of Independence Independent with basic ADLs  Cognition  Overall Cognitive Status Within Functional Limits for tasks assessed  Strength  Left Knee Extension 4+/5  Right Hip Flexion 4/5  Left Hip Flexion 4/5  Right Knee Extension 4/5  Right Ankle Dorsiflexion 3-/5 (increased tone/ plantar flexed and inverted)  Left Ankle Dorsiflexion 4+/5  Ambulation/Gait  Ambulation/Gait Yes  Ambulation/Gait Assistance 4: Min guard  Ambulation Distance (Feet) 170 Feet  Assistive device Large base quad cane  Gait Pattern Decreased stance time - right;Decreased stride length;Decreased dorsiflexion - right  Ambulation Surface Level;Indoor  Gait Comments 2MWT

## 2020-10-29 NOTE — Progress Notes (Signed)
   10/28/20 0001  Assessment  Medical Diagnosis Right side weakness S/P CVA  Referring Provider (PT) Alger Simons MD  Onset Date/Surgical Date 02/14/21  Prior Therapy Pt received OT and PT services at [facility], discharged to SNF, discharged home and received Home Health therapy.  Precautions  Precautions Fall  Restrictions  Weight Bearing Restrictions No  Balance Screen  Has the patient fallen in the past 6 months No  [place] residence  Living Arrangements Other (Comment) (Is now widowed, but her children stay with her off and on for support)  Prior Function  Level of Independence Independent with basic ADLs  Cognition  Overall Cognitive Status Within Functional Limits for tasks assessed  Strength  Left Knee Extension 4+/5  Right Hip Flexion 4/5  Left Hip Flexion 4/5  Right Knee Extension 4/5  Right Ankle Dorsiflexion 3-/5 (increased tone/ plantar flexed and inverted)  Left Ankle Dorsiflexion 4+/5  Ambulation/Gait  Ambulation/Gait Yes  Ambulation/Gait Assistance 4: Min guard  Ambulation Distance (Feet) 170 Feet  Assistive device Large base quad cane  Gait Pattern Decreased stance time - right;Decreased stride length;Decreased dorsiflexion - right  Ambulation Surface Level;Indoor  Gait Comments 2MWT  Static Standing Balance  Static Standing - Comment/# of Minutes 28 seconds (Lt foot rear), <10 sec RT foot rear in semi tandem  Static Standing Balance -  Activities  Tandam Stance - Right Leg;Tandam Stance - Left Leg

## 2020-10-29 NOTE — Therapy (Signed)
[facility] [address] [place], [place], [address] Phone: [phone]   Fax:  [phone]  Physical Therapy Treatment  Patient Details  Name: Alison Cuevas MRN: [id number] Date of Birth: [birth year] Referring Provider (PT): Alger Simons MD  Progress Note Reporting Period 10/09/20 to 10/29/20  See note below for Objective Data and Assessment of Progress/Goals.      Encounter Date: 10/28/2020   PT End of Session - 10/28/20 1311    Visit Number 16    Number of Visits 24    Date for PT Re-Evaluation 11/25/20    Authorization Type Medicare A/ [place] NAP 2ndary    Progress Note Due on Visit 24    PT Start Time 1304    PT Stop Time 1346    PT Time Calculation (min) 42 min    Activity Tolerance Patient tolerated treatment well    Behavior During Therapy WFL for tasks assessed/performed           Past Medical History:  Diagnosis Date  . Allergic rhinitis    maple pollen  . Anxiety   . Arthritis   . Bowel obstruction ([place])   . Chronic low back pain    Dr. Trenton Gammon.  Has had back injection--bp elevated after.  . Chronic pain syndrome   . Chronic renal insufficiency, stage 2 (mild)    GFR 60-70  . DDD (degenerative disc disease), cervical    Hx of ACDF (Dr. [name]).  Followed by Dr. [name].  Also, Dr. [name] do left C7-T1 intralaminal epidural injection.  . Diverticulosis of colon   . DJD (degenerative joint disease)   . Gallstones   . GERD (gastroesophageal reflux disease)   . Herpes zoster 07/08/2014  . 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  Resid R arm and leg weakness + flexion contractures.  . Hypercholesteremia    mild; pt declined statin trial 05/2014--needs recheck lipid panel at first f/u visit in 2017  . Hypertension   . Osteopenia 06/2017   T score -1.4 FRAX 15% / 2%  . Ovarian cyst 2017   82019-robotic assisted bilatel  SPO: all path benign.  . Perianal dermatitis    prn cutivate  . Phlebitis   . Right hemiplegia ([place]) 01/2020   R arm and leg (hemorrhagic L cerebral hemisphere CVA)  . Right knee injury 2018   Patellofemoral crush injury--Dr. [name].  . Stroke ([place])   . Trochanteric bursitis of right hip    Recurrent (injection by Dr. [name] 05/26/15)  . Tympanic membrane rupture, right 12/01/2016   As of 08/2018 pt set for tympanomastoidectomy and STSG (Dr. [name]). Recurrent fungal/bact OE.  . Varicose vein    left leg     Past Surgical History:  Procedure Laterality Date  . ABDOMINAL HYSTERECTOMY  03/2015   TAH.  Last pap 2016.  No hx of abnl paps.  Per GYN, no further pap smears indicated.  . ANTERIOR AND POSTERIOR REPAIR N/A 03/18/2014   Procedure: Cystocele repair with graft, Vault suspension, Rectocele repair;  Surgeon: [name], MD;  Location: [facility];  Service: Urology;  Laterality: N/A;  . CHOLECYSTECTOMY    . CHOLECYSTECTOMY, LAPAROSCOPIC    . COLON SURGERY    . COLONOSCOPY  04/2006; 05/26/16   2007 (Dr. [name]): Normal.  04/2016 (Dr. [name]) normal except diverticulosis and decreased anal sphincter tone.  No repeat colonoscopy is recommended due to age.  Marland Kitchen  cspine surgery     Dr. Wiliam Ke level ant cerv discectomy /fusion w/plating  . CYSTO N/A 03/18/2014   Procedure: CYSTO;  Surgeon: [name], MD;  Location: [facility];  Service: Urology;  Laterality: N/A;  . DEXA  07/30/2015; 06/2018   2017 and 2020 -->Osteopenia--repeat 2 yrs.  Marland Kitchen LYSIS OF ADHESION N/A 01/31/2018   Procedure: POSSIBLE LYSIS OF ADHESION;  Surgeon: Everitt Amber, MD;  Location: [facility];  Service: Gynecology;  Laterality: N/A;  . [place]     with prolasped bledder repair  . RESECTION OF COLON     BENIGN TUMOR  . RIGHT EAR SURGERY  08/23/2017   Dr. 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 right ear surgery  12/12/2018   Right tympanomastoidectomy, ossiculoplasty with partial prosthesis, split thickness skin graft from postauricular skin 1x1cm, and right tragal cartilage graft 1x1 cm [facility])  . right hemicolectomy for diverticulitis with abscess  1993  . ROBOTIC ASSISTED BILATERAL SALPINGO OOPHERECTOMY Bilateral 01/31/2018   All PATH benign.  Procedure: XI ROBOTIC ASSISTED BILATERAL SALPINGO OOPHORECTOMY;  Surgeon: Everitt Amber, MD;  Location: [facility];  Service: Gynecology;  Laterality: Bilateral;  . TRANSTHORACIC ECHOCARDIOGRAM  01/27/2020   EF 65-70%, NORMAL.  . TUBAL LIGATION      There were no vitals filed for this visit.   Subjective Assessment - 10/28/20 1310    Subjective Patient says she feels she is doing much better but is not quite where she wants to be. " I want to get as far as I can get"    Pertinent History LT CVA 01/26/20    Limitations Standing;Walking;House hold activities    Patient Stated Goals Walk more/ better    Currently in Pain? Yes    Pain Score 5     Pain Location Leg    Pain Orientation Left    Pain Descriptors / Indicators Cramping;Aching    Pain Type Acute pain;Chronic pain    Pain Onset More than a month ago    Pain Frequency Intermittent    Aggravating Factors  with movement    Pain Relieving Factors rest    Effect of Pain on Daily Activities Limits             10/28/20 0001  Assessment  Medical Diagnosis Right side weakness S/P CVA  Referring Provider (PT) Alger Simons MD  Onset Date/Surgical Date 02/14/21  Prior Therapy Pt received OT and PT services at [facility], discharged to SNF, discharged home and received Home Health therapy.  Precautions  Precautions Fall  Restrictions  Weight Bearing Restrictions No  Balance Screen  Has the patient fallen in the past 6 months No  [place] residence  Living Arrangements Other (Comment) (Is now widowed, but her children  stay with her off and on for support)  Prior Function  Level of Independence Independent with basic ADLs  Cognition  Overall Cognitive Status Within Functional Limits for tasks assessed  Strength  Left Knee Extension 4+/5  Right Hip Flexion 4/5  Left Hip Flexion 4/5  Right Knee Extension 4/5  Right Ankle Dorsiflexion 3-/5 (increased tone/ plantar flexed and inverted)  Left Ankle Dorsiflexion 4+/5  Ambulation/Gait  Ambulation/Gait Yes  Ambulation/Gait Assistance 4: Min guard  Ambulation Distance (Feet) 170 Feet  Assistive device Large base quad cane  Gait Pattern Decreased stance time - right;Decreased  stride length;Decreased dorsiflexion - right  Ambulation Surface Level;Indoor  Gait Comments 2MWT  Static Standing Balance  Static Standing - Comment/# of Minutes 28 seconds (Lt foot rear), <10 sec RT foot rear in semi tandem  Static Standing Balance -  Activities  Tandam Stance - Right Leg;Tandam Stance - Left Leg      [facility] Adult PT Treatment/Exercise - 10/29/20 0001      Knee/Hip Exercises: Standing   Gait Training 2MWT with RW ( 110 feet) 2MWT with quad cane (170 feet)              PT Short Term Goals - 10/29/20 0853      PT SHORT TERM GOAL #1   Title Patient will be independent with initial HEP and self-management strategies to improve functional outcomes    Time 4    Period Weeks    Status On-going    Target Date 10/16/20      PT SHORT TERM GOAL #2   Title Patient will be able to ambulate at least 100 feet during 2MWT with LRAD to demonstrate improved ability to perform functional mobility and associated tasks.    Baseline 170 feet with quad cane    Time 4    Period Weeks    Status Achieved    Target Date 10/16/20             PT Long Term Goals - 10/29/20 0844      PT LONG TERM GOAL #1   Title Patient will be able to ambulate at least 225 feet during 2MWT with LRAD to demonstrate improved ability to perform functional mobility and associated tasks.     Baseline 170 feet with quad cane    Time 8    Period Weeks    Status On-going      PT LONG TERM GOAL #2   Title Patient will have equal to or > 4/5 MMT throughout RLE to improve ability to perform functional mobility, stair ambulation and ADLs.    Baseline See MMT    Time 8    Period Weeks    Status Partially Met      PT LONG TERM GOAL #3   Title Patient will be able to maintain semi tandem stance >30 seconds on BLEs to improve stability and reduce risk for falls    Baseline See balance    Time 8    Period Weeks    Status On-going                 Plan - 10/29/20 0849    Clinical Impression Statement Patient showing steady progress to therapy goals, and is highly motivated. Patient with significant improvement in gait and able to walk with quad cane with fairly good stability. Patient still has difficulty with static balance and RT ankle weakness, with increased tone noted in RT ankle. This continues to negatively impact her functional ability. At this time, patient would continue to benefit from skilled therapy services to continue working on functional strengthening, balance, and gait to improve functional mobility, independence and reduce risk for future falls.    Personal Factors and Comorbidities Age;Comorbidity 1    Comorbidities CVA    Examination-Activity Limitations Locomotion Level;Bed Mobility;Stand;Stairs;Transfers    Examination-Participation Restrictions Community Activity;Yard Work;Laundry;Driving;Cleaning    Stability/Clinical Decision Making Stable/Uncomplicated    Rehab Potential Good    PT Frequency 2x / week    PT Duration 4 weeks    PT Treatment/Interventions ADLs/Self Care Home Management;Aquatic Therapy;Iontophoresis  56m/ml Dexamethasone;DME Instruction;Balance training;Passive range of motion;Scar mobilization;Neuromuscular re-education;Gait training;Stair training;Functional mobility training;Patient/family education;Manual techniques;Dry needling;Energy  conservation;Manual lymph drainage;Parrafin;Ultrasound;Therapeutic activities;Canalith Repostioning;Moist Heat;Traction;Biofeedback;Cryotherapy;Fluidtherapy;Electrical Stimulation;Contrast Bath;Therapeutic exercise;Orthotic Fit/Training;Compression bandaging;Splinting;Taping;Vasopneumatic Device;Joint Manipulations;Spinal Manipulations;Visual/perceptual remediation/compensation;Vestibular    PT Next Visit Plan Continue standing LE strength, static balance and gait. Progress gait and balance as able. Add amb over hurdles. F/U about ankle brace    PT Home Exercise Plan Eval: seated march, heel/toe raise, LAQs; 09/17/20: STS, standing abduction and marching wiht UE support counter 3/30 staggered stance with support 4/20 bridge, clam 4/27 sidestepping    Consulted and Agree with Plan of Care Patient           Patient will benefit from skilled therapeutic intervention in order to improve the following deficits and impairments:  Pain,Improper body mechanics,Abnormal gait,Decreased balance,Decreased activity tolerance,Decreased range of motion,Decreased strength,Hypomobility,Decreased mobility,Impaired sensation,Difficulty walking  Visit Diagnosis: Other lack of coordination  Other symptoms and signs involving the musculoskeletal system  Other abnormalities of gait and mobility  Muscle weakness (generalized)     Problem List Patient Active Problem List   Diagnosis Date Noted  . Pain in joint of right foot 04/15/2020  . Right spastic hemiparesis ([place] 03/04/2020  . Chronic otitis externa of right ear 02/20/2020  . Intraparenchymal hemorrhage of brain ([facility]   . Delirium   . 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 Stroke (cerebrum) ([place] 01/26/2020  . Conductive hearing loss of right ear with unrestricted hearing of left ear 05/28/2019  . Hyponatremia 08/31/2017  . Generalized weakness 08/31/2017  . Dehydration 08/31/2017  . UTI (urinary tract  infection) 08/31/2017  . Tympanic membrane rupture, right 12/01/2016  . Right ovarian cyst 08/22/2016  . Fibromyalgia 06/03/2015  . Cystocele 03/18/2014  . 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 Diverticulosis of colon without hemorrhage 06/12/2013  . Hypertension   . Osteopenia   . Chronic low back pain 05/12/2011  . HYPERCHOLESTEROLEMIA 03/17/2008  . Anxiety state 09/17/2007  . GERD 09/17/2007  . DJD (degenerative joint disease) 09/17/2007   8:57 AM, 10/29/20 CJosue HectorPT DPT  Physical [facility] [phone]SD[address] [place] [address]Phone: [phone]  Fax:  [phone] Name: RKinzleigh KandlerMRN: [id number] of Birth: [birth year]

## 2020-11-02 ENCOUNTER — Encounter (HOSPITAL_COMMUNITY): Payer: Self-pay | Admitting: Physical Therapy

## 2020-11-02 ENCOUNTER — Ambulatory Visit (HOSPITAL_COMMUNITY): Payer: Medicare Other | Admitting: Physical Therapy

## 2020-11-02 ENCOUNTER — Other Ambulatory Visit: Payer: Self-pay

## 2020-11-02 DIAGNOSIS — M6281 Muscle weakness (generalized): Secondary | ICD-10-CM

## 2020-11-02 DIAGNOSIS — R2689 Other abnormalities of gait and mobility: Secondary | ICD-10-CM

## 2020-11-02 DIAGNOSIS — R29898 Other symptoms and signs involving the musculoskeletal system: Secondary | ICD-10-CM

## 2020-11-02 DIAGNOSIS — R278 Other lack of coordination: Secondary | ICD-10-CM | POA: Diagnosis not present

## 2020-11-02 NOTE — Therapy (Signed)
[place], [place], [address] Phone: [phone]   Fax:  [phone]  Physical Therapy Treatment  Patient Details  Name: Alison Cuevas MRN: [id number] Date of Birth: 1945/12/08 Referring Provider (PT): Alger Simons MD   Encounter Date: 11/02/2020   PT End of Session - 11/02/20 1123    Visit Number 17    Number of Visits 24    Date for PT Re-Evaluation 11/25/20    Authorization Type Medicare A/ [place] NAP 2ndary    Progress Note Due on Visit 24    PT Start Time 1118    PT Stop Time 1200    PT Time Calculation (min) 42 min    Activity Tolerance Patient tolerated treatment well    Behavior During Therapy [facility] for tasks assessed/performed           Past Medical History:  Diagnosis Date  . Allergic rhinitis    maple pollen  . Anxiety   . Arthritis   . Bowel obstruction ([place])   . Chronic low back pain    Dr. Trenton Gammon.  Has had back injection--bp elevated after.  . Chronic pain syndrome   . Chronic renal insufficiency, stage 2 (mild)    GFR 60-70  . DDD (degenerative disc disease), cervical    Hx of ACDF (Dr. [name]).  Followed by Dr. [name].  Also, Dr. [name] do left C7-T1 intralaminal epidural injection.  . Diverticulosis of colon   . DJD (degenerative joint disease)   . Gallstones   . GERD (gastroesophageal reflux disease)   . Herpes zoster 07/08/2014  . 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  Resid R arm and leg weakness + flexion contractures.  . Hypercholesteremia    mild; pt declined statin trial 05/2014--needs recheck lipid panel at first f/u visit in 2017  . Hypertension   . Osteopenia 06/2017   T score -1.4 FRAX 15% / 2%  . Ovarian cyst 2017   82019-robotic assisted bilatel SPO: all path benign.  . Perianal dermatitis    prn cutivate  . Phlebitis   . Right hemiplegia ([place]) 01/2020   R arm and leg  (hemorrhagic L cerebral hemisphere CVA)  . Right knee injury 2018   Patellofemoral crush injury--Dr. [name].  . Stroke ([place])   . Trochanteric bursitis of right hip    Recurrent (injection by Dr. [name] 05/26/15)  . Tympanic membrane rupture, right 12/01/2016   As of 08/2018 pt set for tympanomastoidectomy and STSG (Dr. [name]). Recurrent fungal/bact OE.  . Varicose vein    left leg     Past Surgical History:  Procedure Laterality Date  . ABDOMINAL HYSTERECTOMY  03/2015   TAH.  Last pap 2016.  No hx of abnl paps.  Per GYN, no further pap smears indicated.  . ANTERIOR AND POSTERIOR REPAIR N/A 03/18/2014   Procedure: Cystocele repair with graft, Vault suspension, Rectocele repair;  Surgeon: [name], MD;  [facility]: [facility];  Service: Urology;  Laterality: N/A;  . CHOLECYSTECTOMY    . CHOLECYSTECTOMY, LAPAROSCOPIC    . COLON SURGERY    . COLONOSCOPY  04/2006; 05/26/16   2007 (Dr. [name]): Normal.  04/2016 (Dr. [name]) normal except diverticulosis and decreased anal sphincter tone.  No repeat colonoscopy is recommended due to age.  . cspine surgery     Dr. Wiliam Ke level ant cerv discectomy /fusion w/plating  . CYSTO N/A 03/18/2014   Procedure:  CYSTO;  Surgeon: [name], MD;  [facility];  Service: Urology;  Laterality: N/A;  . DEXA  07/30/2015; 06/2018   2017 and 2020 -->Osteopenia--repeat 2 yrs.  Marland Kitchen LYSIS OF ADHESION N/A 01/31/2018   Procedure: POSSIBLE LYSIS OF ADHESION;  Surgeon: Everitt Amber, MD;  [facility];  Service: Gynecology;  Laterality: N/A;  . [place]     with prolasped bledder repair  . RESECTION OF COLON     BENIGN TUMOR  . RIGHT EAR SURGERY  08/23/2017   Dr. 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 right ear surgery  12/12/2018   Right tympanomastoidectomy, ossiculoplasty with partial prosthesis,  split thickness skin graft from postauricular skin 1x1cm, and right tragal cartilage graft 1x1 cm [facility])  . right hemicolectomy for diverticulitis with abscess  1993  . ROBOTIC ASSISTED BILATERAL SALPINGO OOPHERECTOMY Bilateral 01/31/2018   All PATH benign.  Procedure: XI ROBOTIC ASSISTED BILATERAL SALPINGO OOPHORECTOMY;  Surgeon: Everitt Amber, MD;  [facility];  Service: Gynecology;  Laterality: Bilateral;  . TRANSTHORACIC ECHOCARDIOGRAM  01/27/2020   EF 65-70%, NORMAL.  . TUBAL LIGATION      There were no vitals filed for this visit.   Subjective Assessment - 11/02/20 1122    Subjective Patient reports no pain currently, Just "pressure on my foot".    Pertinent History LT CVA 01/26/20    Limitations Standing;Walking;House hold activities    Patient Stated Goals Walk more/ better    Currently in Pain? No/denies    Pain Onset More than a month ago                             [facility] Adult PT Treatment/Exercise - 11/02/20 0001      Knee/Hip Exercises: Standing   Hip Abduction Both;1 set;10 reps    Gait Training 100 feet with hinge brace and RW, 200 feet with leaf brace and quad cane    Other Standing Knee Exercises step taps 4 inch x10 (pain in LT groin), lunge stretch on 6 inch step 5 x 10", retry step taps 4 inch box (continued hip pain)                    PT Short Term Goals - 10/29/20 0853      PT SHORT TERM GOAL #1   Title Patient will be independent with initial HEP and self-management strategies to improve functional outcomes    Time 4    Period Weeks    Status On-going    Target Date 10/16/20      PT SHORT TERM GOAL #2   Title Patient will be able to ambulate at least 100 feet during 2MWT with LRAD to demonstrate improved ability to perform functional mobility and associated tasks.    Baseline 170 feet with quad cane    Time 4    Period Weeks    Status Achieved    Target Date 10/16/20             PT Long Term Goals - 10/29/20 0844       PT LONG TERM GOAL #1   Title Patient will be able to ambulate at least 225 feet during 2MWT with LRAD to demonstrate improved ability to perform functional mobility and associated tasks.    Baseline 170 feet with  quad cane    Time 8    Period Weeks    Status On-going      PT LONG TERM GOAL #2   Title Patient will have equal to or > 4/5 MMT throughout RLE to improve ability to perform functional mobility, stair ambulation and ADLs.    Baseline See MMT    Time 8    Period Weeks    Status Partially Met      PT LONG TERM GOAL #3   Title Patient will be able to maintain semi tandem stance >30 seconds on BLEs to improve stability and reduce risk for falls    Baseline See balance    Time 8    Period Weeks    Status On-going                 Plan - 11/02/20 1201    Clinical Impression Statement Patient brought secondary AFO with her today that she has been asking about using. Took a look at this with her and donned for gait training. Patient showing less stability with this hinge type brace that she got off [employer]. Patient noting increased foot drop and feels like she is "walking on the ball of my foot". Discussed findings with patient and her son and that the leaf spring type brace she normally wears seems to provide better stability for walking. Patient demos some increased LE stiffness today and noted increased LT groin area pain with lifting LLE. Attempted lunge stretching on 6 inch step to reduce this but w/o success. Patient continues to have difficulty with ambulation and balance as a result. She will continue from skilled theory services to progress working on balance and ambulation for improved functional mobility and independence.    Personal Factors and Comorbidities Age;Comorbidity 1    Comorbidities CVA    Examination-Activity Limitations Locomotion Level;Bed Mobility;Stand;Stairs;Transfers    Examination-Participation Restrictions Community Activity;Yard  Work;Laundry;Driving;Cleaning    Stability/Clinical Decision Making Stable/Uncomplicated    Rehab Potential Good    PT Frequency 2x / week    PT Duration 4 weeks    PT Treatment/Interventions ADLs/Self Care Home Management;Aquatic Therapy;Iontophoresis 65m/ml Dexamethasone;DME Instruction;Balance training;Passive range of motion;Scar mobilization;Neuromuscular re-education;Gait training;Stair training;Functional mobility training;Patient/family education;Manual techniques;Dry needling;Energy conservation;Manual lymph drainage;Parrafin;Ultrasound;Therapeutic activities;Canalith Repostioning;Moist Heat;Traction;Biofeedback;Cryotherapy;Fluidtherapy;Electrical Stimulation;Contrast Bath;Therapeutic exercise;Orthotic Fit/Training;Compression bandaging;Splinting;Taping;Vasopneumatic Device;Joint Manipulations;Spinal Manipulations;Visual/perceptual remediation/compensation;Vestibular    PT Next Visit Plan Continue standing LE strength, static balance and gait. Progress gait and balance as able. Add amb over hurdles.    PT Home Exercise Plan Eval: seated march, heel/toe raise, LAQs; 09/17/20: STS, standing abduction and marching wiht UE support counter 3/30 staggered stance with support 4/20 bridge, clam 4/27 sidestepping    Consulted and Agree with Plan of Care Patient           Patient will benefit from skilled therapeutic intervention in order to improve the following deficits and impairments:  Pain,Improper body mechanics,Abnormal gait,Decreased balance,Decreased activity tolerance,Decreased range of motion,Decreased strength,Hypomobility,Decreased mobility,Impaired sensation,Difficulty walking  Visit Diagnosis: Other lack of coordination  Other symptoms and signs involving the musculoskeletal system  Other abnormalities of gait and mobility  Muscle weakness (generalized)     Problem List Patient Active Problem List   Diagnosis Date Noted  . Pain in joint of right foot 04/15/2020  . Right  spastic hemiparesis ([place] 03/04/2020  . Chronic otitis externa of right ear 02/20/2020  . Intraparenchymal hemorrhage of brain ([place]   . Delirium   . 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  Stroke (cerebrum) ([place]) 01/26/2020  . Conductive hearing loss of right ear with unrestricted hearing of left ear 05/28/2019  . Hyponatremia 08/31/2017  . Generalized weakness 08/31/2017  . Dehydration 08/31/2017  . UTI (urinary tract infection) 08/31/2017  . Tympanic membrane rupture, right 12/01/2016  . Right ovarian cyst 08/22/2016  . Fibromyalgia 06/03/2015  . Cystocele 03/18/2014  . 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 Diverticulosis of colon without hemorrhage 06/12/2013  . Hypertension   . Osteopenia   . Chronic low back pain 05/12/2011  . HYPERCHOLESTEROLEMIA 03/17/2008  . Anxiety state 09/17/2007  . GERD 09/17/2007  . DJD (degenerative joint disease) 09/17/2007    1:44 PM, 11/02/20 Josue Hector PT DPT  Physical Therapist with [facility]  [phone] [place] [address] [place], [place], [address] Phone: [phone]   Fax:  [phone]  Name: Alison Cuevas MRN: [id number] Date of Birth: [birth year]

## 2020-11-04 ENCOUNTER — Encounter (HOSPITAL_COMMUNITY): Payer: Self-pay

## 2020-11-04 ENCOUNTER — Ambulatory Visit (HOSPITAL_COMMUNITY): Payer: Medicare Other | Admitting: Physical Therapy

## 2020-11-04 ENCOUNTER — Ambulatory Visit (HOSPITAL_COMMUNITY): Payer: Medicare Other

## 2020-11-04 ENCOUNTER — Other Ambulatory Visit: Payer: Self-pay

## 2020-11-04 ENCOUNTER — Encounter (HOSPITAL_COMMUNITY): Payer: Self-pay | Admitting: Physical Therapy

## 2020-11-04 DIAGNOSIS — M6281 Muscle weakness (generalized): Secondary | ICD-10-CM

## 2020-11-04 DIAGNOSIS — R278 Other lack of coordination: Secondary | ICD-10-CM

## 2020-11-04 DIAGNOSIS — R2689 Other abnormalities of gait and mobility: Secondary | ICD-10-CM

## 2020-11-04 DIAGNOSIS — R29898 Other symptoms and signs involving the musculoskeletal system: Secondary | ICD-10-CM

## 2020-11-05 NOTE — Progress Notes (Signed)
   11/04/20 0001  Knee/Hip Exercises: Standing  Gait Training 200 feet with SBQC  Knee/Hip Exercises: Seated  Marching 20 reps  Marching Limitations at EOB  Manual Therapy  Manual Therapy Passive ROM  Manual therapy comments completed separately from therapeutic exercises  Passive ROM Bilateral hip flexion and adduction stretch PROM, RT ankle DF and EV PROM

## 2020-11-05 NOTE — Therapy (Signed)
[place], [place], [address] Phone: [phone]   Fax:  [phone]  Occupational Therapy Treatment  Patient Details  Name: Alison Cuevas MRN: [id number] Date of Birth: [birth year] Referring Provider (OT): Alger Simons, MD   Encounter Date: 11/04/2020   OT End of Session - 11/05/20 1251    Visit Number 15    Number of Visits 18    Date for OT Re-Evaluation 11/11/20    Authorization Type double coverage Medicare 80/20% secondary: [employer] Covers what medicare doesn't.    Progress Note Due on Visit 20    OT Start Time 1345    OT Stop Time 1423    OT Time Calculation (min) 38 min    Activity Tolerance Patient tolerated treatment well    Behavior During Therapy WFL for tasks assessed/performed           Past Medical History:  Diagnosis Date  . Allergic rhinitis    maple pollen  . Anxiety   . Arthritis   . Bowel obstruction ([place])   . Chronic low back pain    Dr. Trenton Gammon.  Has had back injection--bp elevated after.  . Chronic pain syndrome   . Chronic renal insufficiency, stage 2 (mild)    GFR 60-70  . DDD (degenerative disc disease), cervical    Hx of ACDF (Dr. [name]).  Followed by Dr. [name].  Also, Dr. [name] do left C7-T1 intralaminal epidural injection.  . Diverticulosis of colon   . DJD (degenerative joint disease)   . Gallstones   . GERD (gastroesophageal reflux disease)   . Herpes zoster 07/08/2014  . 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  Resid R arm and leg weakness + flexion contractures.  . Hypercholesteremia    mild; pt declined statin trial 05/2014--needs recheck lipid panel at first f/u visit in 2017  . Hypertension   . Osteopenia 06/2017   T score -1.4 FRAX 15% / 2%  . Ovarian cyst 2017   82019-robotic assisted bilatel SPO: all path benign.  . Perianal dermatitis    prn cutivate  .  Phlebitis   . Right hemiplegia ([place]) 01/2020   R arm and leg (hemorrhagic L cerebral hemisphere CVA)  . Right knee injury 2018   Patellofemoral crush injury--Dr. [name].  . Stroke ([place])   . Trochanteric bursitis of right hip    Recurrent (injection by Dr. [name] 05/26/15)  . Tympanic membrane rupture, right 12/01/2016   As of 08/2018 pt set for tympanomastoidectomy and STSG (Dr. [name]). Recurrent fungal/bact OE.  . Varicose vein    left leg     Past Surgical History:  Procedure Laterality Date  . ABDOMINAL HYSTERECTOMY  03/2015   TAH.  Last pap 2016.  No hx of abnl paps.  Per GYN, no further pap smears indicated.  . ANTERIOR AND POSTERIOR REPAIR N/A 03/18/2014   Procedure: Cystocele repair with graft, Vault suspension, Rectocele repair;  Surgeon: [name], MD;  [facility];  Service: Urology;  Laterality: N/A;  . CHOLECYSTECTOMY    . CHOLECYSTECTOMY, LAPAROSCOPIC    . COLON SURGERY    . COLONOSCOPY  04/2006; 05/26/16   2007 (Dr. [name]): Normal.  04/2016 (Dr. [name]) normal except diverticulosis and decreased anal sphincter tone.  No repeat colonoscopy is recommended due to age.  . cspine surgery     Dr. Wiliam Ke level ant cerv discectomy /fusion w/plating  . CYSTO  N/A 03/18/2014   Procedure: Kathrene Alu;  Surgeon: [name], MD;  [facility];  Service: Urology;  Laterality: N/A;  . DEXA  07/30/2015; 06/2018   2017 and 2020 -->Osteopenia--repeat 2 yrs.  Marland Kitchen LYSIS OF ADHESION N/A 01/31/2018   Procedure: POSSIBLE LYSIS OF ADHESION;  Surgeon: Everitt Amber, MD;  [facility];  Service: Gynecology;  Laterality: N/A;  . [place]     with prolasped bledder repair  . RESECTION OF COLON     BENIGN TUMOR  . RIGHT EAR SURGERY  08/23/2017   Dr. 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 right ear surgery  12/12/2018    Right tympanomastoidectomy, ossiculoplasty with partial prosthesis, split thickness skin graft from postauricular skin 1x1cm, and right tragal cartilage graft 1x1 cm [facility])  . right hemicolectomy for diverticulitis with abscess  1993  . ROBOTIC ASSISTED BILATERAL SALPINGO OOPHERECTOMY Bilateral 01/31/2018   All PATH benign.  Procedure: XI ROBOTIC ASSISTED BILATERAL SALPINGO OOPHORECTOMY;  Surgeon: Everitt Amber, MD;  [facility];  Service: Gynecology;  Laterality: Bilateral;  . TRANSTHORACIC ECHOCARDIOGRAM  01/27/2020   EF 65-70%, NORMAL.  . TUBAL LIGATION      There were no vitals filed for this visit.   Subjective Assessment - 11/05/20 1237    Subjective  S: My arm just feel more shaky and it doesn't feel stable.    Currently in Pain? Yes   did not provide a definite pain score. Reports heaviness in right shoulder. medial elbow pain also.   Pain Onset --              [facility] OT Assessment - 11/05/20 1239      Assessment   Medical Diagnosis Right side weakness S/P CVA      Precautions   Precautions Fall                    OT Treatments/Exercises (OP) - 11/05/20 1241      Exercises   Exercises Shoulder      Manual Therapy   Manual Therapy Taping;Other (comment)    Manual therapy comments completed separately from therapeutic exercises    Other Manual Therapy Kinsiotape applied to right shoulder to provide functional joint positioning and increased shoulder stability. 1 I strip and 2 Y strips                  OT Education - 11/04/20 1421    Education Details Kinsiotape handout for purpose and removal.    Person(s) Educated Patient    Methods Explanation;Handout    Comprehension Verbalized understanding            OT Short Term Goals - 10/19/20 1246      OT SHORT TERM GOAL #1   Title Pt will be educated and independent with HEP in order to faciliate progress in therapy and work towards increasing her RUE strength and coordination while increasing  her functional performance during ADL tasks.    Time 3    Period Weeks    Target Date 09/23/20             OT Long Term Goals - 10/14/20 1458      OT LONG TERM GOAL #1   Title Patient will increase right shoulder strength and elbow to 5/5 while demonstrating increased shoulder and scapular stability when reaching away from her  body/base of support.    Time 6    Period Weeks    Status On-going      OT LONG TERM GOAL #2   Title Patient will increase right hand coordination by completing the 9 hole peg test in 30 seconds or less in order to roll hair with less difficulty.    Time 6    Period Weeks    Status On-going      OT LONG TERM GOAL #3   Title Patient will increase right hand grip strength by 10# and pinch strength by 5# in order to increase ability to grasp and manipulate items with less difficulty.    Time 6    Period Weeks    Status On-going      OT LONG TERM GOAL #4   Title patient will reach highest level of independence with increased confidence and safety awareness while completing all ADL tasks at modified independent level or better while utilizing adaptive equipment and/or compensatory strategies if needed.    Baseline 3/9: Pt is sponge bathing and does not take a shower unless someone is present for supervision.    Time 6    Period Weeks    Status On-going                 Plan - 11/05/20 1251    Clinical Impression Statement A: with complaints of decreased shoulder stability and increased apraxia when moving her right UE, OT suggested an application of kinsiotape to help position shoulder correctly and allow for greater joint stability. Prior to application, patient completed seated shoulder A/ROM movements. Once tape was applied, shoulder movements were completed once again with greater control and coordination demonstrated. Pt verbalized improvement with shoulder mobility.    Body Structure / Function / Physical Skills ADL;UE functional  use;FMC;ROM;Proprioception;Coordination;GMC;Strength    Plan P: follow up on kinsiotape. fine motor task, pinch task.    Consulted and Agree with Plan of Care Patient           Patient will benefit from skilled therapeutic intervention in order to improve the following deficits and impairments:   Body Structure / Function / Physical Skills: ADL,UE functional use,FMC,ROM,Proprioception,Coordination,GMC,Strength       Visit Diagnosis: Other symptoms and signs involving the musculoskeletal system  Other lack of coordination    Problem List Patient Active Problem List   Diagnosis Date Noted  . Pain in joint of right foot 04/15/2020  . Right spastic hemiparesis ([place]) 03/04/2020  . Chronic otitis externa of right ear 02/20/2020  . Intraparenchymal hemorrhage of brain ([place])   . Delirium   . 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 Stroke (cerebrum) ([place]) 01/26/2020  . Conductive hearing loss of right ear with unrestricted hearing of left ear 05/28/2019  . Hyponatremia 08/31/2017  . Generalized weakness 08/31/2017  . Dehydration 08/31/2017  . UTI (urinary tract infection) 08/31/2017  . Tympanic membrane rupture, right 12/01/2016  . Right ovarian cyst 08/22/2016  . Fibromyalgia 06/03/2015  . Cystocele 03/18/2014  . 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 Diverticulosis of colon without hemorrhage 06/12/2013  . Hypertension   . Osteopenia   . Chronic low back pain 05/12/2011  . HYPERCHOLESTEROLEMIA 03/17/2008  . Anxiety state 09/17/2007  . GERD 09/17/2007  . DJD (degenerative joint disease) 09/17/2007   Ailene Ravel, OTR/L,CBIS  [phone]  11/05/2020, 12:56 PM  [place] [address] [place], [place], [address] Phone: [phone]   Fax:  [phone]  Name: Alison Cuevas MRN: [id number] Date of Birth: 08/26/1945

## 2020-11-05 NOTE — Therapy (Signed)
[place], [place], [address] Phone: [phone]   Fax:  [phone]  Physical Therapy Treatment  Patient Details  Name: Alison Cuevas MRN: [id number] Date of Birth: September 21, 1945 Referring Provider (PT): Alger Simons MD   Encounter Date: 11/04/2020   PT End of Session - 11/04/20 1449    Visit Number 18    Number of Visits 24    Date for PT Re-Evaluation 11/25/20    Authorization Type Medicare A/ [place] NAP 2ndary    Progress Note Due on Visit 24    PT Start Time 1433    PT Stop Time 1515    PT Time Calculation (min) 42 min    Equipment Utilized During Treatment Gait belt    Activity Tolerance Patient tolerated treatment well;Patient limited by pain    Behavior During Therapy [facility] for tasks assessed/performed           Past Medical History:  Diagnosis Date  . Allergic rhinitis    maple pollen  . Anxiety   . Arthritis   . Bowel obstruction ([place])   . Chronic low back pain    Dr. Trenton Gammon.  Has had back injection--bp elevated after.  . Chronic pain syndrome   . Chronic renal insufficiency, stage 2 (mild)    GFR 60-70  . DDD (degenerative disc disease), cervical    Hx of ACDF (Dr. [name]).  Followed by Dr. [name].  Also, Dr. [name] do left C7-T1 intralaminal epidural injection.  . Diverticulosis of colon   . DJD (degenerative joint disease)   . Gallstones   . GERD (gastroesophageal reflux disease)   . Herpes zoster 07/08/2014  . 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  Resid R arm and leg weakness + flexion contractures.  . Hypercholesteremia    mild; pt declined statin trial 05/2014--needs recheck lipid panel at first f/u visit in 2017  . Hypertension   . Osteopenia 06/2017   T score -1.4 FRAX 15% / 2%  . Ovarian cyst 2017   82019-robotic assisted bilatel SPO: all path benign.  . Perianal dermatitis    prn  cutivate  . Phlebitis   . Right hemiplegia ([place]) 01/2020   R arm and leg (hemorrhagic L cerebral hemisphere CVA)  . Right knee injury 2018   Patellofemoral crush injury--Dr. [name].  . Stroke ([place])   . Trochanteric bursitis of right hip    Recurrent (injection by Dr. [name] 05/26/15)  . Tympanic membrane rupture, right 12/01/2016   As of 08/2018 pt set for tympanomastoidectomy and STSG (Dr. [name]). Recurrent fungal/bact OE.  . Varicose vein    left leg     Past Surgical History:  Procedure Laterality Date  . ABDOMINAL HYSTERECTOMY  03/2015   TAH.  Last pap 2016.  No hx of abnl paps.  Per GYN, no further pap smears indicated.  . ANTERIOR AND POSTERIOR REPAIR N/A 03/18/2014   Procedure: Cystocele repair with graft, Vault suspension, Rectocele repair;  Surgeon: [name], MD;  Location: [facility];  Service: Urology;  Laterality: N/A;  . CHOLECYSTECTOMY    . CHOLECYSTECTOMY, LAPAROSCOPIC    . COLON SURGERY    . COLONOSCOPY  04/2006; 05/26/16   2007 (Dr. [name]): Normal.  04/2016 (Dr. [name]) normal except diverticulosis and decreased anal sphincter tone.  No repeat colonoscopy is recommended due to age.  . cspine surgery     Dr. Wiliam Ke level ant  cerv discectomy /fusion w/plating  . CYSTO N/A 03/18/2014   Procedure: CYSTO;  Surgeon: [name], MD;  Location: [facility];  Service: Urology;  Laterality: N/A;  . DEXA  07/30/2015; 06/2018   2017 and 2020 -->Osteopenia--repeat 2 yrs.  Marland Kitchen LYSIS OF ADHESION N/A 01/31/2018   Procedure: POSSIBLE LYSIS OF ADHESION;  Surgeon: Everitt Amber, MD;  Location: [facility];  Service: Gynecology;  Laterality: N/A;  . [place]     with prolasped bledder repair  . RESECTION OF COLON     BENIGN TUMOR  . RIGHT EAR SURGERY  08/23/2017   Dr. 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 right ear surgery   12/12/2018   Right tympanomastoidectomy, ossiculoplasty with partial prosthesis, split thickness skin graft from postauricular skin 1x1cm, and right tragal cartilage graft 1x1 cm [facility])  . right hemicolectomy for diverticulitis with abscess  1993  . ROBOTIC ASSISTED BILATERAL SALPINGO OOPHERECTOMY Bilateral 01/31/2018   All PATH benign.  Procedure: XI ROBOTIC ASSISTED BILATERAL SALPINGO OOPHORECTOMY;  Surgeon: Everitt Amber, MD;  [facility]: [facility];  Service: Gynecology;  Laterality: Bilateral;  . TRANSTHORACIC ECHOCARDIOGRAM  01/27/2020   EF 65-70%, NORMAL.  . TUBAL LIGATION      There were no vitals filed for this visit.   Subjective Assessment - 11/04/20 1436    Subjective Patient says she has a lot going on. Her foot tingles, and her LT hip has been hurting. Her leg is stiff. She talked with MD and her son about a new brace for stretching her foot. She wants to hold on ordering it today because she thinks her sister has the same thing or something similar.    Pertinent History LT CVA 01/26/20    Limitations Standing;Walking;House hold activities    Patient Stated Goals Walk more/ better    Currently in Pain? Yes    Pain Score 5     Pain Location Leg    Pain Orientation Right    Pain Descriptors / Indicators Throbbing;Burning    Pain Type Chronic pain    Pain Onset More than a month ago    Pain Frequency Constant              [facility] PT Assessment - 11/05/20 0001      Assessment   Medical Diagnosis Right side weakness S/P CVA    Referring Provider (PT) Alger Simons MD      ROM / Strength   AROM / PROM / Strength PROM      PROM   Overall PROM Comments Rt ankle held in plantar flexed/ inverted position due to increased tone    PROM Assessment Site Ankle    Right/Left Ankle Right    Right Ankle Dorsiflexion 2             11/04/20 0001  Knee/Hip Exercises: Standing  Gait Training 200 feet with SBQC  Knee/Hip Exercises: Seated  Marching 20 reps  Marching Limitations at  EOB  Manual Therapy  Manual Therapy Passive ROM  Manual therapy comments completed separately from therapeutic exercises  Passive ROM Bilateral hip flexion and adduction stretch PROM, RT ankle DF and EV PROM     PT Short Term Goals - 10/29/20 1025      PT SHORT TERM GOAL #1   Title Patient will be independent with initial HEP and self-management strategies to improve functional outcomes  Time 4    Period Weeks    Status On-going    Target Date 10/16/20      PT SHORT TERM GOAL #2   Title Patient will be able to ambulate at least 100 feet during 2MWT with LRAD to demonstrate improved ability to perform functional mobility and associated tasks.    Baseline 170 feet with quad cane    Time 4    Period Weeks    Status Achieved    Target Date 10/16/20             PT Long Term Goals - 10/29/20 0844      PT LONG TERM GOAL #1   Title Patient will be able to ambulate at least 225 feet during 2MWT with LRAD to demonstrate improved ability to perform functional mobility and associated tasks.    Baseline 170 feet with quad cane    Time 8    Period Weeks    Status On-going      PT LONG TERM GOAL #2   Title Patient will have equal to or > 4/5 MMT throughout RLE to improve ability to perform functional mobility, stair ambulation and ADLs.    Baseline See MMT    Time 8    Period Weeks    Status Partially Met      PT LONG TERM GOAL #3   Title Patient will be able to maintain semi tandem stance >30 seconds on BLEs to improve stability and reduce risk for falls    Baseline See balance    Time 8    Period Weeks    Status On-going                 Plan - 11/05/20 0924    Clinical Impression Statement Provided patient education on JAS brace referral. Patient says she and her son have decided they would like to try this at home for RT ankle DF restrictions. Performed manual PROM to bilateral hip and RT ankle prior to taking updated ROM measurements of RT ankle mobility and  measurements for JAS brace fitting. Patient continues to be limited by RT ankle mobility restrictions due to increased muscle tone, which is negatively impacting gait and functional mobility. Patient also limited by complaint of LT hip pain with hip flexion. Manual PROM performed to address pain and improve mobility. Educated patient on seated marching to tolerance for improved pain free hip flexion strengthening. Trialed gait training with small based quad cane. Patient did fairly well, but notably less steady than with large base quad cane or hemi walker. She continues to ambulate landing with RT forefoot at initial contact versus heel strike and may well benefit form updated bracing as suggested to address ROM/ tone deficits.    Personal Factors and Comorbidities Age;Comorbidity 1    Comorbidities CVA    Examination-Activity Limitations Locomotion Level;Bed Mobility;Stand;Stairs;Transfers    Examination-Participation Restrictions Community Activity;Yard Work;Laundry;Driving;Cleaning    Stability/Clinical Decision Making Stable/Uncomplicated    Rehab Potential Good    PT Frequency 2x / week    PT Duration 4 weeks    PT Treatment/Interventions ADLs/Self Care Home Management;Aquatic Therapy;Iontophoresis 50m/ml Dexamethasone;DME Instruction;Balance training;Passive range of motion;Scar mobilization;Neuromuscular re-education;Gait training;Stair training;Functional mobility training;Patient/family education;Manual techniques;Dry needling;Energy conservation;Manual lymph drainage;Parrafin;Ultrasound;Therapeutic activities;Canalith Repostioning;Moist Heat;Traction;Biofeedback;Cryotherapy;Fluidtherapy;Electrical Stimulation;Contrast Bath;Therapeutic exercise;Orthotic Fit/Training;Compression bandaging;Splinting;Taping;Vasopneumatic Device;Joint Manipulations;Spinal Manipulations;Visual/perceptual remediation/compensation;Vestibular    PT Next Visit Plan Continue standing LE strength, static balance and gait.  Progress gait and balance as able. Add amb over hurdles.    PT Home  Exercise Plan Eval: seated march, heel/toe raise, LAQs; 09/17/20: STS, standing abduction and marching wiht UE support counter 3/30 staggered stance with support 4/20 bridge, clam 4/27 sidestepping    Consulted and Agree with Plan of Care Patient           Patient will benefit from skilled therapeutic intervention in order to improve the following deficits and impairments:  Pain,Improper body mechanics,Abnormal gait,Decreased balance,Decreased activity tolerance,Decreased range of motion,Decreased strength,Hypomobility,Decreased mobility,Impaired sensation,Difficulty walking  Visit Diagnosis: Other lack of coordination  Other symptoms and signs involving the musculoskeletal system  Other abnormalities of gait and mobility  Muscle weakness (generalized)     Problem List Patient Active Problem List   Diagnosis Date Noted  . Pain in joint of right foot 04/15/2020  . Right spastic hemiparesis ([place]) 03/04/2020  . Chronic otitis externa of right ear 02/20/2020  . Intraparenchymal hemorrhage of brain ([place])   . Delirium   . 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 Stroke (cerebrum) ([place]) 01/26/2020  . Conductive hearing loss of right ear with unrestricted hearing of left ear 05/28/2019  . Hyponatremia 08/31/2017  . Generalized weakness 08/31/2017  . Dehydration 08/31/2017  . UTI (urinary tract infection) 08/31/2017  . Tympanic membrane rupture, right 12/01/2016  . Right ovarian cyst 08/22/2016  . Fibromyalgia 06/03/2015  . Cystocele 03/18/2014  . 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 Diverticulosis of colon without hemorrhage 06/12/2013  . Hypertension   . Osteopenia   . Chronic low back pain 05/12/2011  . HYPERCHOLESTEROLEMIA 03/17/2008  . Anxiety state 09/17/2007  . GERD 09/17/2007  . DJD (degenerative joint disease) 09/17/2007   9:29 AM, 11/05/20 Josue Hector PT  DPT  Physical Therapist with [facility]  [phone] [place] [address] [place], [place], [address] Phone: [phone]   Fax:  [phone]  Name: Poppy Mcafee MRN: [id number] Date of Birth: [birth year]

## 2020-11-06 ENCOUNTER — Ambulatory Visit (INDEPENDENT_AMBULATORY_CARE_PROVIDER_SITE_OTHER): Payer: Medicare Other | Admitting: Family Medicine

## 2020-11-06 ENCOUNTER — Other Ambulatory Visit: Payer: Self-pay

## 2020-11-06 ENCOUNTER — Encounter: Payer: Self-pay | Admitting: Family Medicine

## 2020-11-06 VITALS — BP 138/68 | HR 88 | Temp 97.8°F | Resp 16 | Ht 60.0 in | Wt 116.4 lb

## 2020-11-06 DIAGNOSIS — R5381 Other malaise: Secondary | ICD-10-CM

## 2020-11-06 DIAGNOSIS — R1033 Periumbilical pain: Secondary | ICD-10-CM

## 2020-11-06 NOTE — Progress Notes (Signed)
OFFICE VISIT  11/06/2020  CC:  Chief Complaint  Patient presents with  . Hyponatremia    Requesting labs, not feeling well   HPI:    Patient is a 75 y.o. Caucasian female who presents accompanied by her daughter today for a general feeling of malaise. Onset this morning feeling "bad", head feeling "tight", some mid epig pain-"feels like it's empty". Had a pretty good day yesterday.  Has good days and bad days, esp since the recent passing of her husband.  No fevers.  No URI sx's, no cough.  Mild nausea.  He ate BF and lunch today. Not taking ibuprofen or other NSAID. Takes ASA qd. No dysuria or unusual urgency/frequency. No signif constipation, no diarrhea.  Drinking fluids well. Home bp's 130s/70s.  ROS as above, plus-->no CP, no SOB, no wheezing, some mild intermittent lightheaded feeling.  No HAs, no rashes, no melena/hematochezia.  No polyuria or polydipsia.  No myalgias or arthralgias.  No NEW focal weakness, paresthesias, or tremors.  No acute vision or hearing abnormalities.  No recent changes in lower legs. No n/v/d or abd pain.  No palpitations.    Past Medical History:  Diagnosis Date  . Allergic rhinitis    maple pollen  . Anxiety   . Arthritis   . Bowel obstruction ([place])   . Chronic low back pain    Dr. Trenton Gammon.  Has had back injection--bp elevated after.  . Chronic pain syndrome   . Chronic renal insufficiency, stage 2 (mild)    GFR 60-70  . DDD (degenerative disc disease), cervical    Hx of ACDF (Dr. [name]).  Followed by Dr. [name].  Also, Dr. [name] do left C7-T1 intralaminal epidural injection.  . Diverticulosis of colon   . DJD (degenerative joint disease)   . Gallstones   . GERD (gastroesophageal reflux disease)   . Herpes zoster 07/08/2014  . 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  Resid R arm and leg weakness + flexion contractures.  . Hypercholesteremia     mild; pt declined statin trial 05/2014--needs recheck lipid panel at first f/u visit in 2017  . Hypertension   . Osteopenia 06/2017   T score -1.4 FRAX 15% / 2%  . Ovarian cyst 2017   82019-robotic assisted bilatel SPO: all path benign.  . Perianal dermatitis    prn cutivate  . Phlebitis   . Right hemiplegia ([place]) 01/2020   R arm and leg (hemorrhagic L cerebral hemisphere CVA)  . Right knee injury 2018   Patellofemoral crush injury--Dr. [name].  . Stroke ([place])   . Trochanteric bursitis of right hip    Recurrent (injection by Dr. [name] 05/26/15)  . Tympanic membrane rupture, right 12/01/2016   As of 08/2018 pt set for tympanomastoidectomy and STSG (Dr. [name]). Recurrent fungal/bact OE.  . Varicose vein    left leg     Past Surgical History:  Procedure Laterality Date  . ABDOMINAL HYSTERECTOMY  03/2015   TAH.  Last pap 2016.  No hx of abnl paps.  Per GYN, no further pap smears indicated.  . ANTERIOR AND POSTERIOR REPAIR N/A 03/18/2014   Procedure: Cystocele repair with graft, Vault suspension, Rectocele repair;  Surgeon: [name], MD;  [facility];  Service: Urology;  Laterality: N/A;  . CHOLECYSTECTOMY    . CHOLECYSTECTOMY, LAPAROSCOPIC    . COLON SURGERY    . COLONOSCOPY  04/2006; 05/26/16   2007 (Dr. [name]):  Normal.  04/2016 (Dr. [name]) normal except diverticulosis and decreased anal sphincter tone.  No repeat colonoscopy is recommended due to age.  . cspine surgery     Dr. Wiliam Ke level ant cerv discectomy /fusion w/plating  . CYSTO N/A 03/18/2014   Procedure: CYSTO;  Surgeon: [name], MD;  [facility];  Service: Urology;  Laterality: N/A;  . DEXA  07/30/2015; 06/2018   2017 and 2020 -->Osteopenia--repeat 2 yrs.  Marland Kitchen LYSIS OF ADHESION N/A 01/31/2018   Procedure: POSSIBLE LYSIS OF ADHESION;  Surgeon: Everitt Amber, MD;  [facility];  Service: Gynecology;  Laterality: N/A;  . [place]     with prolasped bledder repair  . RESECTION  OF COLON     BENIGN TUMOR  . RIGHT EAR SURGERY  08/23/2017   Dr. 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 right ear surgery  12/12/2018   Right tympanomastoidectomy, ossiculoplasty with partial prosthesis, split thickness skin graft from postauricular skin 1x1cm, and right tragal cartilage graft 1x1 cm [facility])  . right hemicolectomy for diverticulitis with abscess  1993  . ROBOTIC ASSISTED BILATERAL SALPINGO OOPHERECTOMY Bilateral 01/31/2018   All PATH benign.  Procedure: XI ROBOTIC ASSISTED BILATERAL SALPINGO OOPHORECTOMY;  Surgeon: Everitt Amber, MD;  [facility];  Service: Gynecology;  Laterality: Bilateral;  . TRANSTHORACIC ECHOCARDIOGRAM  01/27/2020   EF 65-70%, NORMAL.  . TUBAL LIGATION      Outpatient Medications Prior to Visit  Medication Sig Dispense Refill  . acetaminophen (TYLENOL) 500 MG tablet Take 1 tablet (500 mg total) by mouth 3 (three) times daily. 30 tablet 0  . ALPRAZolam (XANAX) 0.5 MG tablet TAKE 1 TABLET BY MOUTH THREE TIMES A DAY AS NEEDED FOR ANXIETY 90 tablet 5  . aspirin EC 81 MG tablet Take 1 tablet (81 mg total) by mouth daily. Swallow whole. 30 tablet 11  . Biotin 5 MG TABS Take 5 mg by mouth daily.    . fluticasone (FLONASE) 50 MCG/ACT nasal spray Place 2 sprays into both nostrils at bedtime. 48 g 3  . HYDROcodone-acetaminophen (NORCO/VICODIN) 5-325 MG tablet 1/2-1 tab po q6h prn pain 15 tablet 0  . lisinopril (ZESTRIL) 5 MG tablet Take 1 tablet (5 mg total) by mouth daily. 90 tablet 3  . loratadine (CLARITIN) 10 MG tablet Take 10 mg by mouth every other day.     . meclizine (ANTIVERT) 12.5 MG tablet Take 1 tablet (12.5 mg total) by mouth 3 (three) times daily. (Patient taking differently: Take 12.5 mg by mouth. Takes at night) 30 tablet 0  . Multiple Minerals-Vitamins (CALCIUM-MAGNESIUM-ZINC-D3 PO) Take 1 tablet by mouth  daily.     Marland Kitchen nystatin ointment (MYCOSTATIN) Apply 1 application topically 2 (two) times daily. Apply to affected area for up to 7 days. 30 g 0  . pantoprazole (PROTONIX) 40 MG tablet Take 1 tablet (40 mg total) by mouth at bedtime. 90 tablet 3  . polyethylene glycol (MIRALAX / GLYCOLAX) 17 g packet Take 17 g by mouth daily as needed.    . potassium chloride SA (KLOR-CON) 20 MEQ tablet Take 1 tablet (20 mEq total) by mouth daily. 90 tablet 3  . Sodium Fluoride (CLINPRO 5000) 1.1 % PSTE Clinpro 5000 1.1 % dental paste    . tiZANidine (ZANAFLEX) 2 MG tablet 1 tab po tid prn for muscle relaxation 90 tablet 3  . triamcinolone  ointment (KENALOG) 0.1 % triamcinolone acetonide 0.1 % topical ointment    . verapamil (CALAN-SR) 180 MG CR tablet Take 1 tablet (180 mg total) by mouth daily. 90 tablet 3   No facility-administered medications prior to visit.    Allergies  Allergen Reactions  . Gabapentin Anxiety    Elevated heart rate and crazy dreams  . Prednisone Anxiety and Other (See Comments)    REACTION: funny feeling    ROS As per HPI  PE: Vitals with BMI 11/06/2020 10/20/2020 10/14/2020  Height 5\' 0"  - 5\' 0"   Weight 116 lbs 6 oz (No Data) 116 lbs  BMI 72.53 - 66.44  Systolic 034 742 595  Diastolic 68 70 80  Pulse 88 64 79     Gen: Alert, well appearing.  Patient is oriented to person, place, time, and situation. AFFECT: pleasant, lucid thought and speech. GLO:VFIE: no injection, icteris, swelling, or exudate.  EOMI, PERRLA. Mouth: lips without lesion/swelling.  Oral mucosa pink and moist. Oropharynx without erythema, exudate, or swelling.  CV: RRR, no m/r/g.   LUNGS: CTA bilat, nonlabored resps, good aeration in all lung fields. ABD: soft, NT, ND, BS normal.  No hepatospenomegaly or mass.  No bruits. EXT: no clubbing or cyanosis.  no edema.    LABS:  Lab Results  Component Value Date   TSH 1.08 09/25/2019   Lab Results  Component Value Date   WBC 9.0 06/25/2020   HGB 13.4  06/25/2020   HCT 40.0 06/25/2020   MCV 92.0 06/25/2020   PLT 217 06/25/2020   Lab Results  Component Value Date   CREATININE 0.77 09/22/2020   BUN 12 09/22/2020   NA 133 (L) 09/22/2020   K 4.5 09/22/2020   CL 98 09/22/2020   CO2 28 09/22/2020   Lab Results  Component Value Date   ALT 29 06/25/2020   AST 30 06/25/2020   ALKPHOS 95 06/25/2020   BILITOT 1.0 06/25/2020   Lab Results  Component Value Date   CHOL 163 01/28/2020   Lab Results  Component Value Date   HDL 75 01/28/2020   Lab Results  Component Value Date   LDLCALC 81 01/28/2020   Lab Results  Component Value Date   TRIG 33 01/28/2020   Lab Results  Component Value Date   CHOLHDL 2.2 01/28/2020   Lab Results  Component Value Date   HGBA1C 5.0 01/28/2020   IMPRESSION AND PLAN:  Nonspecific malaise, some peri-umbilical pain. Exam reassuring. She is not sure if she is taking protonix bid or not (med list says qd) but I recommended she increase it to bid if not already doing that. Otherwise no new meds at this time. Check cbc, bmet.  An After Visit Summary was printed and given to the patient.  FOLLOW UP: Return if symptoms worsen or fail to improve.  Signed:  Crissie Sickles, MD           11/06/2020

## 2020-11-06 NOTE — Telephone Encounter (Signed)
Spoke with pt regarding appt and verbally discussed with PCP, ok given for 4:15pm slot today for evaluation. Pt will call to cancel if unable to make it. She has been going to the bathroom a lot due to [facility]

## 2020-11-07 LAB — CBC WITH DIFFERENTIAL/PLATELET
Absolute Monocytes: 637 cells/uL (ref 200–950)
Basophils Absolute: 18 cells/uL (ref 0–200)
Basophils Relative: 0.3 %
Eosinophils Absolute: 41 cells/uL (ref 15–500)
Eosinophils Relative: 0.7 %
HCT: 36 % (ref 35.0–45.0)
Hemoglobin: 12.6 g/dL (ref 11.7–15.5)
Lymphs Abs: 2142 cells/uL (ref 850–3900)
MCH: 31 pg (ref 27.0–33.0)
MCHC: 35 g/dL (ref 32.0–36.0)
MCV: 88.7 fL (ref 80.0–100.0)
MPV: 10.8 fL (ref 7.5–12.5)
Monocytes Relative: 10.8 %
Neutro Abs: 3062 cells/uL (ref 1500–7800)
Neutrophils Relative %: 51.9 %
Platelets: 219 10*3/uL (ref 140–400)
RBC: 4.06 10*6/uL (ref 3.80–5.10)
RDW: 12 % (ref 11.0–15.0)
Total Lymphocyte: 36.3 %
WBC: 5.9 10*3/uL (ref 3.8–10.8)

## 2020-11-07 LAB — BASIC METABOLIC PANEL
BUN: 11 mg/dL (ref 7–25)
CO2: 25 mmol/L (ref 20–32)
Calcium: 9.7 mg/dL (ref 8.6–10.4)
Chloride: 96 mmol/L — ABNORMAL LOW (ref 98–110)
Creat: 0.67 mg/dL (ref 0.60–0.93)
Glucose, Bld: 97 mg/dL (ref 65–99)
Potassium: 4.3 mmol/L (ref 3.5–5.3)
Sodium: 130 mmol/L — ABNORMAL LOW (ref 135–146)

## 2020-11-09 ENCOUNTER — Encounter (HOSPITAL_COMMUNITY): Payer: Self-pay

## 2020-11-09 ENCOUNTER — Ambulatory Visit (HOSPITAL_COMMUNITY): Payer: Medicare Other

## 2020-11-09 ENCOUNTER — Other Ambulatory Visit: Payer: Self-pay

## 2020-11-09 ENCOUNTER — Ambulatory Visit (HOSPITAL_COMMUNITY): Payer: Medicare Other | Admitting: Physical Therapy

## 2020-11-09 ENCOUNTER — Encounter (HOSPITAL_COMMUNITY): Payer: Self-pay | Admitting: Physical Therapy

## 2020-11-09 DIAGNOSIS — R2689 Other abnormalities of gait and mobility: Secondary | ICD-10-CM

## 2020-11-09 DIAGNOSIS — R278 Other lack of coordination: Secondary | ICD-10-CM

## 2020-11-09 DIAGNOSIS — R29898 Other symptoms and signs involving the musculoskeletal system: Secondary | ICD-10-CM

## 2020-11-09 DIAGNOSIS — M6281 Muscle weakness (generalized): Secondary | ICD-10-CM

## 2020-11-09 NOTE — Therapy (Signed)
[place], [place], [address] Phone: [phone]   Fax:  [phone]  Physical Therapy Treatment  Patient Details  Name: Alison Cuevas MRN: [id number] Date of Birth: 1945/08/12 Referring Provider (PT): Alger Simons MD   Encounter Date: 11/09/2020   PT End of Session - 11/09/20 1035    Visit Number 19    Number of Visits 24    Date for PT Re-Evaluation 11/25/20    Authorization Type Medicare A/ [place] NAP 2ndary    Progress Note Due on Visit 24    PT Start Time 1032    PT Stop Time 1112    PT Time Calculation (min) 40 min    Equipment Utilized During Treatment Gait belt    Activity Tolerance Patient tolerated treatment well;Patient limited by fatigue    Behavior During Therapy [facility] for tasks assessed/performed           Past Medical History:  Diagnosis Date  . Allergic rhinitis    maple pollen  . Anxiety   . Arthritis   . Bowel obstruction ([place])   . Chronic low back pain    Dr. Trenton Gammon.  Has had back injection--bp elevated after.  . Chronic pain syndrome   . Chronic renal insufficiency, stage 2 (mild)    GFR 60-70  . DDD (degenerative disc disease), cervical    Hx of ACDF (Dr. [name]).  Followed by Dr. [name].  Also, Dr. [name] do left C7-T1 intralaminal epidural injection.  . Diverticulosis of colon   . DJD (degenerative joint disease)   . Gallstones   . GERD (gastroesophageal reflux disease)   . Herpes zoster 07/08/2014  . 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  Resid R arm and leg weakness + flexion contractures.  . Hypercholesteremia    mild; pt declined statin trial 05/2014--needs recheck lipid panel at first f/u visit in 2017  . Hypertension   . Osteopenia 06/2017   T score -1.4 FRAX 15% / 2%  . Ovarian cyst 2017   82019-robotic assisted bilatel SPO: all path benign.  . Perianal dermatitis     prn cutivate  . Phlebitis   . Right hemiplegia ([place]) 01/2020   R arm and leg (hemorrhagic L cerebral hemisphere CVA)  . Right knee injury 2018   Patellofemoral crush injury--Dr. [name].  . Stroke ([place])   . Trochanteric bursitis of right hip    Recurrent (injection by Dr. [name] 05/26/15)  . Tympanic membrane rupture, right 12/01/2016   As of 08/2018 pt set for tympanomastoidectomy and STSG (Dr. [name]). Recurrent fungal/bact OE.  . Varicose vein    left leg     Past Surgical History:  Procedure Laterality Date  . ABDOMINAL HYSTERECTOMY  03/2015   TAH.  Last pap 2016.  No hx of abnl paps.  Per GYN, no further pap smears indicated.  . ANTERIOR AND POSTERIOR REPAIR N/A 03/18/2014   Procedure: Cystocele repair with graft, Vault suspension, Rectocele repair;  Surgeon: [name], MD;  Location: [facility];  Service: Urology;  Laterality: N/A;  . CHOLECYSTECTOMY    . CHOLECYSTECTOMY, LAPAROSCOPIC    . COLON SURGERY    . COLONOSCOPY  04/2006; 05/26/16   2007 (Dr. [name]): Normal.  04/2016 (Dr. [name]) normal except diverticulosis and decreased anal sphincter tone.  No repeat colonoscopy is recommended due to age.  . cspine surgery     Dr. Wiliam Ke level ant  cerv discectomy /fusion w/plating  . CYSTO N/A 03/18/2014   Procedure: CYSTO;  Surgeon: [name], MD;  Location: [facility];  Service: Urology;  Laterality: N/A;  . DEXA  07/30/2015; 06/2018   2017 and 2020 -->Osteopenia--repeat 2 yrs.  Marland Kitchen LYSIS OF ADHESION N/A 01/31/2018   Procedure: POSSIBLE LYSIS OF ADHESION;  Surgeon: Everitt Amber, MD;  Location: [facility];  Service: Gynecology;  Laterality: N/A;  . [place]     with prolasped bledder repair  . RESECTION OF COLON     BENIGN TUMOR  . RIGHT EAR SURGERY  08/23/2017   Dr. 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 right ear surgery   12/12/2018   Right tympanomastoidectomy, ossiculoplasty with partial prosthesis, split thickness skin graft from postauricular skin 1x1cm, and right tragal cartilage graft 1x1 cm [facility]  . right hemicolectomy for diverticulitis with abscess  1993  . ROBOTIC ASSISTED BILATERAL SALPINGO OOPHERECTOMY Bilateral 01/31/2018   All PATH benign.  Procedure: XI ROBOTIC ASSISTED BILATERAL SALPINGO OOPHORECTOMY;  Surgeon: Everitt Amber, MD;  Location[facility];  Service: Gynecology;  Laterality: Bilateral;  . TRANSTHORACIC ECHOCARDIOGRAM  01/27/2020   EF 65-70%, NORMAL.  . TUBAL LIGATION      There were no vitals filed for this visit.   Subjective Assessment - 11/09/20 1034    Subjective Patient states her RT hip is numb. She says her LT hip still hurts when doing exercise.    Pertinent History LT CVA 01/26/20    Limitations Standing;Walking;House hold activities    Patient Stated Goals Walk more/ better    Currently in Pain? Yes    Pain Score 4     Pain Location Hip    Pain Orientation Right    Pain Descriptors / Indicators Aching    Pain Type Chronic pain    Pain Onset More than a month ago    Pain Frequency Intermittent                             [facility] Adult PT Treatment/Exercise - 11/09/20 1035      Knee/Hip Exercises: Standing   Gait Training 226 with large base quad cane, 226 feet with hemi walker    Other Standing Knee Exercises alt step taps 4 inch x 20, 4 in step up x10 each    Other Standing Knee Exercises sidestepping in // bars 4RT, tandem stance 3 x 20" HHA x 1      Knee/Hip Exercises: Seated   Long Arc Quad Strengthening;Both;2 sets;10 reps;Weights    Long Arc Quad Weight 2 lbs.    Sit to Sand 10 reps;with UE support                    PT Short Term Goals - 10/29/20 0853      PT SHORT TERM GOAL #1   Title Patient will be independent with initial HEP and self-management strategies to improve functional outcomes    Time 4    Period Weeks    Status  On-going    Target Date 10/16/20      PT SHORT TERM GOAL #2   Title Patient will be able to ambulate at least 100 feet during 2MWT with LRAD to demonstrate improved ability to perform functional mobility and associated tasks.    Baseline 170 feet  with quad cane    Time 4    Period Weeks    Status Achieved    Target Date 10/16/20             PT Long Term Goals - 10/29/20 0844      PT LONG TERM GOAL #1   Title Patient will be able to ambulate at least 225 feet during 2MWT with LRAD to demonstrate improved ability to perform functional mobility and associated tasks.    Baseline 170 feet with quad cane    Time 8    Period Weeks    Status On-going      PT LONG TERM GOAL #2   Title Patient will have equal to or > 4/5 MMT throughout RLE to improve ability to perform functional mobility, stair ambulation and ADLs.    Baseline See MMT    Time 8    Period Weeks    Status Partially Met      PT LONG TERM GOAL #3   Title Patient will be able to maintain semi tandem stance >30 seconds on BLEs to improve stability and reduce risk for falls    Baseline See balance    Time 8    Period Weeks    Status On-going                 Plan - 11/09/20 1112    Clinical Impression Statement Patient resumed standing balance and gait training today. Patient tolerated session well overall but notes mild fatigue at end of session. Patient did not note increased pain during session. Patient ambulated with large quad cane and hemi walker. She requires verbal cues for large step and heel strike for improved ground clearance. Patient with LOB x 1 using quad cane, appears steadier with hemi walker, though possibly more fatigue. Educated patient on these findings. Will continue to progress as tolerated.    Personal Factors and Comorbidities Age;Comorbidity 1    Comorbidities CVA    Examination-Activity Limitations Locomotion Level;Bed Mobility;Stand;Stairs;Transfers    Examination-Participation  Restrictions Community Activity;Yard Work;Laundry;Driving;Cleaning    Stability/Clinical Decision Making Stable/Uncomplicated    Rehab Potential Good    PT Frequency 2x / week    PT Duration 4 weeks    PT Treatment/Interventions ADLs/Self Care Home Management;Aquatic Therapy;Iontophoresis 46m/ml Dexamethasone;DME Instruction;Balance training;Passive range of motion;Scar mobilization;Neuromuscular re-education;Gait training;Stair training;Functional mobility training;Patient/family education;Manual techniques;Dry needling;Energy conservation;Manual lymph drainage;Parrafin;Ultrasound;Therapeutic activities;Canalith Repostioning;Moist Heat;Traction;Biofeedback;Cryotherapy;Fluidtherapy;Electrical Stimulation;Contrast Bath;Therapeutic exercise;Orthotic Fit/Training;Compression bandaging;Splinting;Taping;Vasopneumatic Device;Joint Manipulations;Spinal Manipulations;Visual/perceptual remediation/compensation;Vestibular    PT Next Visit Plan F/U about JAS brace. Continue standing LE strength, static balance and gait. Progress gait and balance as able. Add amb over hurdles.    PT Home Exercise Plan Eval: seated march, heel/toe raise, LAQs; 09/17/20: STS, standing abduction and marching wiht UE support counter 3/30 staggered stance with support 4/20 bridge, clam 4/27 sidestepping    Consulted and Agree with Plan of Care Patient           Patient will benefit from skilled therapeutic intervention in order to improve the following deficits and impairments:  Pain,Improper body mechanics,Abnormal gait,Decreased balance,Decreased activity tolerance,Decreased range of motion,Decreased strength,Hypomobility,Decreased mobility,Impaired sensation,Difficulty walking  Visit Diagnosis: Other lack of coordination  Other symptoms and signs involving the musculoskeletal system  Other abnormalities of gait and mobility  Muscle weakness (generalized)     Problem List Patient Active Problem List   Diagnosis Date  Noted  . Pain in joint of right foot 04/15/2020  . Right spastic hemiparesis ([place] 03/04/2020  . Chronic otitis  externa of right ear 02/20/2020  . Intraparenchymal hemorrhage of brain ([place])   . Delirium   . 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 Stroke (cerebrum) ([place]) 01/26/2020  . Conductive hearing loss of right ear with unrestricted hearing of left ear 05/28/2019  . Hyponatremia 08/31/2017  . Generalized weakness 08/31/2017  . Dehydration 08/31/2017  . UTI (urinary tract infection) 08/31/2017  . Tympanic membrane rupture, right 12/01/2016  . Right ovarian cyst 08/22/2016  . Fibromyalgia 06/03/2015  . Cystocele 03/18/2014  . 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 Diverticulosis of colon without hemorrhage 06/12/2013  . Hypertension   . Osteopenia   . Chronic low back pain 05/12/2011  . HYPERCHOLESTEROLEMIA 03/17/2008  . Anxiety state 09/17/2007  . GERD 09/17/2007  . DJD (degenerative joint disease) 09/17/2007   11:14 AM, 11/09/20 Josue Hector PT DPT  Physical Therapist with [facility]  [phone] [place] [address] [place], [place], [address] Phone: [phone]   Fax:  [phone]  Name: Alison Cuevas MRN: [id number] Date of Birth: Nov 21, 1945

## 2020-11-09 NOTE — Therapy (Signed)
[place], [place], [address] Phone: [phone]   Fax:  [phone]  Occupational Therapy Treatment  Patient Details  Name: Alison Cuevas MRN: [id number] Date of Birth: August 27, 1945 Referring Provider (OT): Alger Simons, MD   Encounter Date: 11/09/2020   OT End of Session - 11/09/20 1000    Visit Number 16    Number of Visits 18    Date for OT Re-Evaluation 11/11/20    Authorization Type double coverage Medicare 80/20% secondary: [employer] Covers what medicare doesn't.    Progress Note Due on Visit 20    OT Start Time [phone]    OT Stop Time 1030    OT Time Calculation (min) 44 min    Activity Tolerance Patient tolerated treatment well    Behavior During Therapy WFL for tasks assessed/performed           Past Medical History:  Diagnosis Date  . Allergic rhinitis    maple pollen  . Anxiety   . Arthritis   . Bowel obstruction ([place])   . Chronic low back pain    Dr. Trenton Gammon.  Has had back injection--bp elevated after.  . Chronic pain syndrome   . Chronic renal insufficiency, stage 2 (mild)    GFR 60-70  . DDD (degenerative disc disease), cervical    Hx of ACDF (Dr. [name]).  Followed by Dr. [name].  Also, Dr. [name] do left C7-T1 intralaminal epidural injection.  . Diverticulosis of colon   . DJD (degenerative joint disease)   . Gallstones   . GERD (gastroesophageal reflux disease)   . Herpes zoster 07/08/2014  . 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  Resid R arm and leg weakness + flexion contractures.  . Hypercholesteremia    mild; pt declined statin trial 05/2014--needs recheck lipid panel at first f/u visit in 2017  . Hypertension   . Osteopenia 06/2017   T score -1.4 FRAX 15% / 2%  . Ovarian cyst 2017   82019-robotic assisted bilatel SPO: all path benign.  . Perianal dermatitis    prn cutivate  .  Phlebitis   . Right hemiplegia ([place]) 01/2020   R arm and leg (hemorrhagic L cerebral hemisphere CVA)  . Right knee injury 2018   Patellofemoral crush injury--Dr. [name].  . Stroke ([place])   . Trochanteric bursitis of right hip    Recurrent (injection by Dr. [name] 05/26/15)  . Tympanic membrane rupture, right 12/01/2016   As of 08/2018 pt set for tympanomastoidectomy and STSG (Dr. [name]). Recurrent fungal/bact OE.  . Varicose vein    left leg     Past Surgical History:  Procedure Laterality Date  . ABDOMINAL HYSTERECTOMY  03/2015   TAH.  Last pap 2016.  No hx of abnl paps.  Per GYN, no further pap smears indicated.  . ANTERIOR AND POSTERIOR REPAIR N/A 03/18/2014   Procedure: Cystocele repair with graft, Vault suspension, Rectocele repair;  Surgeon: [name], MD;  [facility];  Service: Urology;  Laterality: N/A;  . CHOLECYSTECTOMY    . CHOLECYSTECTOMY, LAPAROSCOPIC    . COLON SURGERY    . COLONOSCOPY  04/2006; 05/26/16   2007 (Dr. [name]): Normal.  04/2016 (Dr. [name]) normal except diverticulosis and decreased anal sphincter tone.  No repeat colonoscopy is recommended due to age.  . cspine surgery     Dr. Wiliam Ke level ant cerv discectomy /fusion w/plating  . CYSTO  N/A 03/18/2014   Procedure: Kathrene Alu;  Surgeon: [name], MD;  [facility];  Service: Urology;  Laterality: N/A;  . DEXA  07/30/2015; 06/2018   2017 and 2020 -->Osteopenia--repeat 2 yrs.  Marland Kitchen LYSIS OF ADHESION N/A 01/31/2018   Procedure: POSSIBLE LYSIS OF ADHESION;  Surgeon: Everitt Amber, MD;  [facility];  Service: Gynecology;  Laterality: N/A;  . [place]     with prolasped bledder repair  . RESECTION OF COLON     BENIGN TUMOR  . RIGHT EAR SURGERY  08/23/2017   Dr. 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 right ear surgery  12/12/2018    Right tympanomastoidectomy, ossiculoplasty with partial prosthesis, split thickness skin graft from postauricular skin 1x1cm, and right tragal cartilage graft 1x1 cm [facility])  . right hemicolectomy for diverticulitis with abscess  1993  . ROBOTIC ASSISTED BILATERAL SALPINGO OOPHERECTOMY Bilateral 01/31/2018   All PATH benign.  Procedure: XI ROBOTIC ASSISTED BILATERAL SALPINGO OOPHORECTOMY;  Surgeon: Everitt Amber, MD;  [facility];  Service: Gynecology;  Laterality: Bilateral;  . TRANSTHORACIC ECHOCARDIOGRAM  01/27/2020   EF 65-70%, NORMAL.  . TUBAL LIGATION      There were no vitals filed for this visit.   Subjective Assessment - 11/09/20 0952    Subjective  S: I did call Dr. Naaman Plummer. He said that I was more    Currently in Pain? Yes    Pain Score 7     Pain Location Shoulder    Pain Orientation Right    Pain Descriptors / Indicators Other (Comment)   STIFFNESS   Pain Type Acute pain    Pain Onset 1 to 4 weeks ago    Pain Frequency Constant    Aggravating Factors  "can't think of anything."    Pain Relieving Factors stretching and doing the arm exercises    Effect of Pain on Daily Activities mod effect    Multiple Pain Sites No              [facility] OT Assessment - 11/09/20 1014      Assessment   Medical Diagnosis Right side weakness S/P CVA      Precautions   Precautions Fall                    OT Treatments/Exercises (OP) - 11/09/20 1014      Exercises   Exercises Hand      Hand Exercises   Other Hand Exercises Resistive clothespins used while seated then transitioning to standing to focusing on 3 point pinch strength and shoulder functional reaching. Completed yellow, red, green, and most of blue. Required set-up due to decreased sensation to hold pins with thumb, index, and middle finger since grasp.    Other Hand Exercises Coordination task completed using color pattern ping pongs.                    OT Short Term Goals - 10/19/20 1246       OT SHORT TERM GOAL #1   Title Pt will be educated and independent with HEP in order to faciliate progress in therapy and work towards increasing her RUE strength and coordination while increasing her functional performance during ADL tasks.    Time 3    Period Weeks    Target Date 09/23/20  OT Long Term Goals - 10/14/20 1458      OT LONG TERM GOAL #1   Title Patient will increase right shoulder strength and elbow to 5/5 while demonstrating increased shoulder and scapular stability when reaching away from her body/base of support.    Time 6    Period Weeks    Status On-going      OT LONG TERM GOAL #2   Title Patient will increase right hand coordination by completing the 9 hole peg test in 30 seconds or less in order to roll hair with less difficulty.    Time 6    Period Weeks    Status On-going      OT LONG TERM GOAL #3   Title Patient will increase right hand grip strength by 10# and pinch strength by 5# in order to increase ability to grasp and manipulate items with less difficulty.    Time 6    Period Weeks    Status On-going      OT LONG TERM GOAL #4   Title patient will reach highest level of independence with increased confidence and safety awareness while completing all ADL tasks at modified independent level or better while utilizing adaptive equipment and/or compensatory strategies if needed.    Baseline 3/9: Pt is sponge bathing and does not take a shower unless someone is present for supervision.    Time 6    Period Weeks    Status On-going                 Plan - 11/09/20 1017    Clinical Impression Statement A: Tape removed at end of session using Tape removal pads. Lotion applied to area. Focused session pinch strength and coordination using the right hand. Due to decreased sensation, patient demonstrated difficulty picking up clothespins using her thumb, index, and middle finger. She used her middle and ring finger primarily which caused  increased difficulty pinching. Set-up was provided for sucess of task. Followed clothespins with additional coordination task requiring manipulation of ping pongs using fingertips of all fingers.    Body Structure / Function / Physical Skills ADL;UE functional use;FMC;ROM;Proprioception;Coordination;GMC;Strength    Plan P: Reassessment. re-apply kinsiotape to right shoulder. Practice sidelying bed positioning with pillows. Review HEP    Consulted and Agree with Plan of Care Patient           Patient will benefit from skilled therapeutic intervention in order to improve the following deficits and impairments:   Body Structure / Function / Physical Skills: ADL,UE functional use,FMC,ROM,Proprioception,Coordination,GMC,Strength       Visit Diagnosis: Other symptoms and signs involving the musculoskeletal system  Other lack of coordination    Problem List Patient Active Problem List   Diagnosis Date Noted  . Pain in joint of right foot 04/15/2020  . Right spastic hemiparesis ([place]) 03/04/2020  . Chronic otitis externa of right ear 02/20/2020  . Intraparenchymal hemorrhage of brain ([place])   . Delirium   . 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 Stroke (cerebrum) ([place]) 01/26/2020  . Conductive hearing loss of right ear with unrestricted hearing of left ear 05/28/2019  . Hyponatremia 08/31/2017  . Generalized weakness 08/31/2017  . Dehydration 08/31/2017  . UTI (urinary tract infection) 08/31/2017  . Tympanic membrane rupture, right 12/01/2016  . Right ovarian cyst 08/22/2016  . Fibromyalgia 06/03/2015  . Cystocele 03/18/2014  . 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 Diverticulosis of colon without hemorrhage 06/12/2013  . Hypertension   .  Osteopenia   . Chronic low back pain 05/12/2011  . HYPERCHOLESTEROLEMIA 03/17/2008  . Anxiety state 09/17/2007  . GERD 09/17/2007  . DJD (degenerative joint disease) 09/17/2007    Ailene Ravel,  OTR/L,CBIS  [phone]  11/09/2020, [place] [address] [place], [place], [address] Phone: [phone]   Fax:  [phone]  Name: Alison Cuevas MRN: [id number] Date of Birth: 03/02/1946

## 2020-11-11 ENCOUNTER — Encounter (HOSPITAL_COMMUNITY): Payer: Self-pay | Admitting: Physical Therapy

## 2020-11-11 ENCOUNTER — Ambulatory Visit (HOSPITAL_COMMUNITY): Payer: Medicare Other | Admitting: Physical Therapy

## 2020-11-11 ENCOUNTER — Ambulatory Visit (HOSPITAL_COMMUNITY): Payer: Medicare Other

## 2020-11-11 ENCOUNTER — Encounter (HOSPITAL_COMMUNITY): Payer: Self-pay

## 2020-11-11 ENCOUNTER — Other Ambulatory Visit: Payer: Self-pay

## 2020-11-11 DIAGNOSIS — R29898 Other symptoms and signs involving the musculoskeletal system: Secondary | ICD-10-CM

## 2020-11-11 DIAGNOSIS — M6281 Muscle weakness (generalized): Secondary | ICD-10-CM

## 2020-11-11 DIAGNOSIS — R278 Other lack of coordination: Secondary | ICD-10-CM

## 2020-11-11 DIAGNOSIS — R2689 Other abnormalities of gait and mobility: Secondary | ICD-10-CM

## 2020-11-11 NOTE — Therapy (Signed)
[facility] [address] [place], [place], [address] Phone: [phone]   Fax:  [phone]  Physical Therapy Treatment  Patient Details  Name: Alison Cuevas MRN: [id number] Date of Birth: April 26, 1946 Referring Provider (PT): Alger Simons MD   Encounter Date: 11/11/2020   PT End of Session - 11/11/20 1441    Visit Number 20    Number of Visits 24    Date for PT Re-Evaluation 11/25/20    Authorization Type Medicare A/ [place] NAP 2ndary    Progress Note Due on Visit 24    PT Start Time 1435    PT Stop Time 1513    PT Time Calculation (min) 38 min    Equipment Utilized During Treatment Gait belt    Activity Tolerance Patient tolerated treatment well;Patient limited by fatigue    Behavior During Therapy [facility] for tasks assessed/performed           Past Medical History:  Diagnosis Date  . Allergic rhinitis    maple pollen  . Anxiety   . Arthritis   . Bowel obstruction ([place])   . Chronic low back pain    Dr. Trenton Gammon.  Has had back injection--bp elevated after.  . Chronic pain syndrome   . Chronic renal insufficiency, stage 2 (mild)    GFR 60-70  . DDD (degenerative disc disease), cervical    Hx of ACDF (Dr. [name]).  Followed by Dr. [name].  Also, Dr. [name] do left C7-T1 intralaminal epidural injection.  . Diverticulosis of colon   . DJD (degenerative joint disease)   . Gallstones   . GERD (gastroesophageal reflux disease)   . Herpes zoster 07/08/2014  . 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  Resid R arm and leg weakness + flexion contractures.  . Hypercholesteremia    mild; pt declined statin trial 05/2014--needs recheck lipid panel at first f/u visit in 2017  . Hypertension   . Osteopenia 06/2017   T score -1.4 FRAX 15% / 2%  . Ovarian cyst 2017   82019-robotic assisted bilatel SPO: all path benign.  . Perianal dermatitis     prn cutivate  . Phlebitis   . Right hemiplegia ([place]) 01/2020   R arm and leg (hemorrhagic L cerebral hemisphere CVA)  . Right knee injury 2018   Patellofemoral crush injury--Dr. [name].  . Stroke ([place])   . Trochanteric bursitis of right hip    Recurrent (injection by Dr. [name] 05/26/15)  . Tympanic membrane rupture, right 12/01/2016   As of 08/2018 pt set for tympanomastoidectomy and STSG (Dr. [name]). Recurrent fungal/bact OE.  . Varicose vein    left leg     Past Surgical History:  Procedure Laterality Date  . ABDOMINAL HYSTERECTOMY  03/2015   TAH.  Last pap 2016.  No hx of abnl paps.  Per GYN, no further pap smears indicated.  . ANTERIOR AND POSTERIOR REPAIR N/A 03/18/2014   Procedure: Cystocele repair with graft, Vault suspension, Rectocele repair;  Surgeon: [name], MD;  Location: [facility];  Service: Urology;  Laterality: N/A;  . CHOLECYSTECTOMY    . CHOLECYSTECTOMY, LAPAROSCOPIC    . COLON SURGERY    . COLONOSCOPY  04/2006; 05/26/16   2007 (Dr. [name]): Normal.  04/2016 (Dr. [name]) normal except diverticulosis and decreased anal sphincter tone.  No repeat colonoscopy is recommended due to age.  . cspine surgery     Dr. Wiliam Ke level ant  cerv discectomy /fusion w/plating  . CYSTO N/A 03/18/2014   Procedure: CYSTO;  Surgeon: [name], MD;  [facility]: [facility];  Service: Urology;  Laterality: N/A;  . DEXA  07/30/2015; 06/2018   2017 and 2020 -->Osteopenia--repeat 2 yrs.  Marland Kitchen LYSIS OF ADHESION N/A 01/31/2018   Procedure: POSSIBLE LYSIS OF ADHESION;  Surgeon: Everitt Amber, MD;  [facility];  Service: Gynecology;  Laterality: N/A;  . [place]     with prolasped bledder repair  . RESECTION OF COLON     BENIGN TUMOR  . RIGHT EAR SURGERY  08/23/2017   Dr. 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 right ear surgery   12/12/2018   Right tympanomastoidectomy, ossiculoplasty with partial prosthesis, split thickness skin graft from postauricular skin 1x1cm, and right tragal cartilage graft 1x1 cm [facility])  . right hemicolectomy for diverticulitis with abscess  1993  . ROBOTIC ASSISTED BILATERAL SALPINGO OOPHERECTOMY Bilateral 01/31/2018   All PATH benign.  Procedure: XI ROBOTIC ASSISTED BILATERAL SALPINGO OOPHORECTOMY;  Surgeon: Everitt Amber, MD;  [facility];  Service: Gynecology;  Laterality: Bilateral;  . TRANSTHORACIC ECHOCARDIOGRAM  01/27/2020   EF 65-70%, NORMAL.  . TUBAL LIGATION      There were no vitals filed for this visit.   Subjective Assessment - 11/11/20 1440    Subjective Pain is a little better in the leg. Still feels stiff.    Pertinent History LT CVA 01/26/20    Limitations Standing;Walking;House hold activities    Patient Stated Goals Walk more/ better    Currently in Pain? Yes    Pain Score 3     Pain Location Leg    Pain Orientation Right    Pain Descriptors / Indicators Tightness    Pain Onset More than a month ago    Pain Frequency Constant                             [facility] Adult PT Treatment/Exercise - 11/11/20 0001      Knee/Hip Exercises: Standing   Lateral Step Up Both;1 set;10 reps;Hand Hold: 2;Step Height: 4"    Forward Step Up Both;10 reps;Hand Hold: 2;Step Height: 4";1 set    Gait Training 226 feet with hemi walker    Other Standing Knee Exercises alt step taps 4 inch x 20, sem,i tandem stance 3 x 30" each    Other Standing Knee Exercises hurdle ambualtion in // bars 4 RT 4 inch hurdles HHA x 2                    PT Short Term Goals - 10/29/20 2637      PT SHORT TERM GOAL #1   Title Patient will be independent with initial HEP and self-management strategies to improve functional outcomes    Time 4    Period Weeks    Status On-going    Target Date 10/16/20      PT SHORT TERM GOAL #2   Title Patient will be able to ambulate at  least 100 feet during 2MWT with LRAD to demonstrate improved ability to perform functional mobility and associated tasks.    Baseline 170 feet with quad cane    Time 4    Period Weeks    Status Achieved    Target Date 10/16/20  PT Long Term Goals - 10/29/20 0844      PT LONG TERM GOAL #1   Title Patient will be able to ambulate at least 225 feet during 2MWT with LRAD to demonstrate improved ability to perform functional mobility and associated tasks.    Baseline 170 feet with quad cane    Time 8    Period Weeks    Status On-going      PT LONG TERM GOAL #2   Title Patient will have equal to or > 4/5 MMT throughout RLE to improve ability to perform functional mobility, stair ambulation and ADLs.    Baseline See MMT    Time 8    Period Weeks    Status Partially Met      PT LONG TERM GOAL #3   Title Patient will be able to maintain semi tandem stance >30 seconds on BLEs to improve stability and reduce risk for falls    Baseline See balance    Time 8    Period Weeks    Status On-going                 Plan - 11/11/20 1618    Clinical Impression Statement Patient reporting slightly more fatigue with activity today but tolerated overall well. Patient doing well ambulating with hemi walker, shows good stability and no LOB, but does require verbal cues for spacing to avoid getting too close to wall on LT side. Added hurdle ambulation. Patient did well with this. Required cues for ground clearance and heel strike to avoid kicking over hurdle, but improved quickly with cues. Patient will continue to benefit from skilled therapy services to reduce deficits and improve functional ability.    Personal Factors and Comorbidities Age;Comorbidity 1    Comorbidities CVA    Examination-Activity Limitations Locomotion Level;Bed Mobility;Stand;Stairs;Transfers    Examination-Participation Restrictions Community Activity;Yard Work;Laundry;Driving;Cleaning    Stability/Clinical  Decision Making Stable/Uncomplicated    Rehab Potential Good    PT Frequency 2x / week    PT Duration 4 weeks    PT Treatment/Interventions ADLs/Self Care Home Management;Aquatic Therapy;Iontophoresis 4mg /ml Dexamethasone;DME Instruction;Balance training;Passive range of motion;Scar mobilization;Neuromuscular re-education;Gait training;Stair training;Functional mobility training;Patient/family education;Manual techniques;Dry needling;Energy conservation;Manual lymph drainage;Parrafin;Ultrasound;Therapeutic activities;Canalith Repostioning;Moist Heat;Traction;Biofeedback;Cryotherapy;Fluidtherapy;Electrical Stimulation;Contrast Bath;Therapeutic exercise;Orthotic Fit/Training;Compression bandaging;Splinting;Taping;Vasopneumatic Device;Joint Manipulations;Spinal Manipulations;Visual/perceptual remediation/compensation;Vestibular    PT Next Visit Plan F/U about JAS brace. Continue standing LE strength, static balance and gait. Progress gait and balance as able. Cone taps    PT Home Exercise Plan Eval: seated march, heel/toe raise, LAQs; 09/17/20: STS, standing abduction and marching wiht UE support counter 3/30 staggered stance with support 4/20 bridge, clam 4/27 sidestepping    Consulted and Agree with Plan of Care Patient           Patient will benefit from skilled therapeutic intervention in order to improve the following deficits and impairments:  Pain,Improper body mechanics,Abnormal gait,Decreased balance,Decreased activity tolerance,Decreased range of motion,Decreased strength,Hypomobility,Decreased mobility,Impaired sensation,Difficulty walking  Visit Diagnosis: Other symptoms and signs involving the musculoskeletal system  Other lack of coordination  Other abnormalities of gait and mobility  Muscle weakness (generalized)     Problem List Patient Active Problem List   Diagnosis Date Noted  . Pain in joint of right foot 04/15/2020  . Right spastic hemiparesis ([place]) 03/04/2020  .  Chronic otitis externa of right ear 02/20/2020  . Intraparenchymal hemorrhage of brain ([place])   . Delirium   . 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 Stroke (cerebrum) ([place]) 01/26/2020  .  Conductive hearing loss of right ear with unrestricted hearing of left ear 05/28/2019  . Hyponatremia 08/31/2017  . Generalized weakness 08/31/2017  . Dehydration 08/31/2017  . UTI (urinary tract infection) 08/31/2017  . Tympanic membrane rupture, right 12/01/2016  . Right ovarian cyst 08/22/2016  . Fibromyalgia 06/03/2015  . Cystocele 03/18/2014  . 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 Diverticulosis of colon without hemorrhage 06/12/2013  . Hypertension   . Osteopenia   . Chronic low back pain 05/12/2011  . HYPERCHOLESTEROLEMIA 03/17/2008  . Anxiety state 09/17/2007  . GERD 09/17/2007  . DJD (degenerative joint disease) 09/17/2007    4:19 PM, 11/11/20 Josue Hector PT DPT  Physical Therapist with [facility]  [phone] [facility] [address] [place], [place], [address] Phone: [phone]   Fax:  [phone]  Name: Alison Cuevas MRN: [id number] Date of Birth: 11/28/1945

## 2020-11-12 ENCOUNTER — Ambulatory Visit (HOSPITAL_COMMUNITY)
Admission: RE | Admit: 2020-11-12 | Discharge: 2020-11-12 | Disposition: A | Discharge status: Home / Self Care | Payer: Medicare Other | Source: Ambulatory Visit | Attending: Nurse Practitioner | Admitting: Nurse Practitioner

## 2020-11-12 DIAGNOSIS — Z1231 Encounter for screening mammogram for malignant neoplasm of breast: Secondary | ICD-10-CM | POA: Insufficient documentation

## 2020-11-12 NOTE — Therapy (Addendum)
[facility] [address] [place], [place], [address] Phone: [phone]   Fax:  [phone]  Occupational Therapy Treatment Reassessment/re-cert Patient Details  Name: Alison Cuevas MRN: [id number] Date of Birth: 1945/07/27 Referring Provider (OT): Alger Simons, MD   Progress Note Reporting Period 10/14/20 to 11/11/20  See note below for Objective Data and Assessment of Progress/Goals.       Encounter Date: 11/11/2020   OT End of Session - 11/12/20 0916    Visit Number 17    Number of Visits 25    Date for OT Re-Evaluation 12/09/20    Authorization Type double coverage Medicare 80/20% secondary: [employer] Covers what medicare doesn't.    Progress Note Due on Visit 27    OT Start Time 1345    OT Stop Time 1430    OT Time Calculation (min) 45 min    Activity Tolerance Patient tolerated treatment well    Behavior During Therapy WFL for tasks assessed/performed           Past Medical History:  Diagnosis Date  . Allergic rhinitis    maple pollen  . Anxiety   . Arthritis   . Bowel obstruction ([place])   . Chronic low back pain    Dr. Trenton Gammon.  Has had back injection--bp elevated after.  . Chronic pain syndrome   . Chronic renal insufficiency, stage 2 (mild)    GFR 60-70  . DDD (degenerative disc disease), cervical    Hx of ACDF (Dr. [name]).  Followed by Dr. [name].  Also, Dr. [name] do left C7-T1 intralaminal epidural injection.  . Diverticulosis of colon   . DJD (degenerative joint disease)   . Gallstones   . GERD (gastroesophageal reflux disease)   . Herpes zoster 07/08/2014  . 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  Resid R arm and leg weakness + flexion contractures.  . Hypercholesteremia    mild; pt declined statin trial 05/2014--needs recheck lipid panel at first f/u visit in 2017  . Hypertension   . Osteopenia 06/2017   T  score -1.4 FRAX 15% / 2%  . Ovarian cyst 2017   82019-robotic assisted bilatel SPO: all path benign.  . Perianal dermatitis    prn cutivate  . Phlebitis   . Right hemiplegia ([place]) 01/2020   R arm and leg (hemorrhagic L cerebral hemisphere CVA)  . Right knee injury 2018   Patellofemoral crush injury--Dr. [name].  . Stroke ([place])   . Trochanteric bursitis of right hip    Recurrent (injection by Dr. [name] 05/26/15)  . Tympanic membrane rupture, right 12/01/2016   As of 08/2018 pt set for tympanomastoidectomy and STSG (Dr. [name]). Recurrent fungal/bact OE.  . Varicose vein    left leg     Past Surgical History:  Procedure Laterality Date  . ABDOMINAL HYSTERECTOMY  03/2015   TAH.  Last pap 2016.  No hx of abnl paps.  Per GYN, no further pap smears indicated.  . ANTERIOR AND POSTERIOR REPAIR N/A 03/18/2014   Procedure: Cystocele repair with graft, Vault suspension, Rectocele repair;  Surgeon: [name], MD;  [facility];  Service: Urology;  Laterality: N/A;  . CHOLECYSTECTOMY    . CHOLECYSTECTOMY, LAPAROSCOPIC    . COLON SURGERY    . COLONOSCOPY  04/2006; 05/26/16   2007 (Dr. [name]): Normal.  04/2016 (Dr. [name]) normal except diverticulosis and decreased anal sphincter tone.  No repeat colonoscopy  is recommended due to age.  . cspine surgery     Dr. Wiliam Ke level ant cerv discectomy /fusion w/plating  . CYSTO N/A 03/18/2014   Procedure: CYSTO;  Surgeon: [name], MD;  [facility];  Service: Urology;  Laterality: N/A;  . DEXA  07/30/2015; 06/2018   2017 and 2020 -->Osteopenia--repeat 2 yrs.  Marland Kitchen LYSIS OF ADHESION N/A 01/31/2018   Procedure: POSSIBLE LYSIS OF ADHESION;  Surgeon: Everitt Amber, MD;  [facility];  Service: Gynecology;  Laterality: N/A;  . [place]     with prolasped bledder repair  . RESECTION OF COLON     BENIGN TUMOR  . RIGHT EAR SURGERY  08/23/2017   Dr. 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 right ear surgery  12/12/2018   Right tympanomastoidectomy, ossiculoplasty with partial prosthesis, split thickness skin graft from postauricular skin 1x1cm, and right tragal cartilage graft 1x1 cm [facility])  . right hemicolectomy for diverticulitis with abscess  1993  . ROBOTIC ASSISTED BILATERAL SALPINGO OOPHERECTOMY Bilateral 01/31/2018   All PATH benign.  Procedure: XI ROBOTIC ASSISTED BILATERAL SALPINGO OOPHORECTOMY;  Surgeon: Everitt Amber, MD;  [facility];  Service: Gynecology;  Laterality: Bilateral;  . TRANSTHORACIC ECHOCARDIOGRAM  01/27/2020   EF 65-70%, NORMAL.  . TUBAL LIGATION      There were no vitals filed for this visit.   Subjective Assessment - 11/11/20 1351    Subjective  S: My is so jumpy. It was getting better at one point.    Currently in Pain? Yes    Pain Score 9     Pain Location Arm    Pain Orientation Right    Pain Descriptors / Indicators Discomfort    Pain Type Acute pain    Pain Onset More than a month ago    Pain Frequency Constant    Aggravating Factors  nothing    Pain Relieving Factors kinesiotape to shoulder helped a little    Effect of Pain on Daily Activities mod effect              [facility] OT Assessment - 11/11/20 1355      Assessment   Medical Diagnosis Right side weakness S/P CVA    Referring Provider (OT) Alger Simons, MD    Onset Date/Surgical Date 02/14/21      Precautions   Precautions Fall      Prior Function   Level of Independence Independent with basic ADLs      ADL   ADL comments Difficulty with fixing hair (rolling hair with rollers and bobbie pins), dusting and cleaning; especially cleaning the bathroom, making the bed.      Observation/Other Assessments   Observations Tested for pronator drift; negative. Patient's right arm was able to remain supinated although arm would slowly drop down then come back up to  shoulder level.      Coordination   9 Hole Peg Test Right    Right 9 Hole Peg Test 37.5"   previous: 44.6"   Box and Blocks R: 29 L: 45      Strength   Strength Assessment Site Shoulder;Elbow;Forearm;Wrist    Right/Left Shoulder Right    Right Shoulder Flexion 4+/5   previous: same   Right Shoulder ABduction 4+/5   previous: same   Right Shoulder Internal Rotation 5/5   previous: same   Right Shoulder  External Rotation 4+/5   previous: 3+/5   Right/Left Elbow --    Right/Left hand Right    Right Hand Grip (lbs) 50   preivous: 45 (although patient is at average norm for age.)   Right Hand Lateral Pinch 10 lbs   previous: 12 (although patient is at average norm for age)   Right Hand 3 Point Pinch 10 lbs   previous: 12 (although patient is at average norm for age)                           OT Education - 11/12/20 0916    Education Details Discussed findings of evaluation and discussed goals for therapy.    Person(s) Educated Patient    Methods Explanation    Comprehension Verbalized understanding            OT Short Term Goals - 10/19/20 1246      OT SHORT TERM GOAL #1   Title Pt will be educated and independent with HEP in order to faciliate progress in therapy and work towards increasing her RUE strength and coordination while increasing her functional performance during ADL tasks.    Time 3    Period Weeks    Target Date 09/23/20             OT Long Term Goals - 11/11/20 1420      OT LONG TERM GOAL #1   Title Patient will increase right shoulder strength and elbow to 5/5 while demonstrating increased shoulder and scapular stability when reaching away from her body/base of support.    Time 6    Period Weeks    Status On-going      OT LONG TERM GOAL #2   Title Patient will increase right hand coordination by completing the 9 hole peg test in 30 seconds or less in order to roll hair with less difficulty.    Time 6    Period Weeks    Status  On-going      OT LONG TERM GOAL #3   Title Patient will increase right hand grip strength by 10# and pinch strength by 5# in order to increase ability to grasp and manipulate items with less difficulty.    Time 6    Period Weeks    Status Achieved      OT LONG TERM GOAL #4   Title patient will reach highest level of independence with increased confidence and safety awareness while completing all ADL tasks at modified independent level or better while utilizing adaptive equipment and/or compensatory strategies if needed.    Time 6    Period Weeks    Status Achieved             OT LONG TERM GOAL #5  Title Patient will increase her gross motor coordination in her right UE by moving 38 blocks within a minute during the box and blocks test in order to increase her ability to roll her hair with modified independence.  Time 4  Period Weeks  Status New  Target Date 12/09/20  Long Term Additional Goals  Additional Long Term Goals Yes  OT LONG TERM GOAL #6  Title Patient will be able to demonstrate  increased LUE strength and endurance in order to complete simple housekeeping tasks such as cleaning/dusting and making her bed with supervision if needed.  Time 4  Period Weeks  Status New        Plan - 11/12/20  0917    Clinical Impression Statement A: reassessment completed this date. Patient is able to demonstrate age appropriate grip and pinch strength per average norms. She reports decreased motor control in her right UE when attempting to use it to complete grooming tasks (ie. rolling her hair). Decreased motor control appears to be more prominant in the wrist and hand versus the shoulder. Kinsiotape has been applied to her right shoulder in the past session with reports of increased stability and helping to decrease her discomfort and pain. Gross motor coordination was assessed with deficits demonstrated. Pt verbalized goals of being able to return to housekeeping tasks and rolling her  hair. Two new long term goals have been made to address motor coordination and ADL tasks. At this time, patient has met her short term goal and 2/4 long term goals. She will continue to benefit from skilled OT services to improve deficits mentioned.    Body Structure / Function / Physical Skills ADL;UE functional use;FMC;ROM;Proprioception;Coordination;GMC;Strength    OT Frequency 2x / week    OT Duration 4 weeks    OT Treatment/Interventions Self-care/ADL training;Ultrasound;Patient/family education;Visual/perceptual remediation/compensation;DME and/or AE instruction;Passive range of motion;Cryotherapy;Electrical Stimulation;Moist Heat;Neuromuscular education;Therapeutic activities;Manual Therapy;Therapeutic exercise;Splinting    Plan P: Continue skilled OT services two times a week for 4 weeks. Focus on increasing motor control of right arm while completing functional daily tasks (fixing hair with rollers, dusting, making bed, cleaning). Next session: Patient to bring in rolers and bobbie pins to work on ability to fix hair with less difficulty. Assess requirements of task. Reapply kinsiotape to right shoulder for pain management and joint stability.    Consulted and Agree with Plan of Care Patient           Patient will benefit from skilled therapeutic intervention in order to improve the following deficits and impairments:   Body Structure / Function / Physical Skills: ADL,UE functional use,FMC,ROM,Proprioception,Coordination,GMC,Strength       Visit Diagnosis: Other symptoms and signs involving the musculoskeletal system - Plan: Ot plan of care cert/re-cert  Other lack of coordination - Plan: Ot plan of care cert/re-cert    Problem List Patient Active Problem List   Diagnosis Date Noted  . Pain in joint of right foot 04/15/2020  . Right spastic hemiparesis ([place]) 03/04/2020  . Chronic otitis externa of right ear 02/20/2020  . Intraparenchymal hemorrhage of brain ([place])   . Delirium    . 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 Stroke (cerebrum) ([place]) 01/26/2020  . Conductive hearing loss of right ear with unrestricted hearing of left ear 05/28/2019  . Hyponatremia 08/31/2017  . Generalized weakness 08/31/2017  . Dehydration 08/31/2017  . UTI (urinary tract infection) 08/31/2017  . Tympanic membrane rupture, right 12/01/2016  . Right ovarian cyst 08/22/2016  . Fibromyalgia 06/03/2015  . Cystocele 03/18/2014  . 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 Diverticulosis of colon without hemorrhage 06/12/2013  . Hypertension   . Osteopenia   . Chronic low back pain 05/12/2011  . HYPERCHOLESTEROLEMIA 03/17/2008  . Anxiety state 09/17/2007  . GERD 09/17/2007  . DJD (degenerative joint disease) 09/17/2007    Ailene Ravel, OTR/L,CBIS  [phone]  11/12/2020, 9:32 AM  [facility] [address] [place], [place], [address] Phone: [phone]   Fax:  [phone]  Name: Alison Cuevas MRN: [id number] Date of Birth: [birth year]

## 2020-11-13 ENCOUNTER — Other Ambulatory Visit (HOSPITAL_COMMUNITY): Payer: Self-pay | Admitting: Nurse Practitioner

## 2020-11-13 DIAGNOSIS — R928 Other abnormal and inconclusive findings on diagnostic imaging of breast: Secondary | ICD-10-CM

## 2020-11-16 ENCOUNTER — Ambulatory Visit (HOSPITAL_COMMUNITY): Payer: Medicare Other

## 2020-11-16 ENCOUNTER — Other Ambulatory Visit: Payer: Self-pay

## 2020-11-16 ENCOUNTER — Encounter (HOSPITAL_COMMUNITY): Payer: Self-pay

## 2020-11-16 ENCOUNTER — Encounter (HOSPITAL_COMMUNITY): Payer: Self-pay | Admitting: Physical Therapy

## 2020-11-16 ENCOUNTER — Ambulatory Visit (HOSPITAL_COMMUNITY): Payer: Medicare Other | Admitting: Physical Therapy

## 2020-11-16 DIAGNOSIS — M6281 Muscle weakness (generalized): Secondary | ICD-10-CM

## 2020-11-16 DIAGNOSIS — R278 Other lack of coordination: Secondary | ICD-10-CM

## 2020-11-16 DIAGNOSIS — R29898 Other symptoms and signs involving the musculoskeletal system: Secondary | ICD-10-CM

## 2020-11-16 DIAGNOSIS — R2689 Other abnormalities of gait and mobility: Secondary | ICD-10-CM

## 2020-11-16 NOTE — Patient Instructions (Signed)
    or  *Pillow behind back is option.  Try to have your spine, neck, and hip and top leg in alignment. Use pillows to help.  Have your pillows in position and ready when you get into bed. Try getting into bed on the opposite side to lessen how much pillow moving you need to do.

## 2020-11-16 NOTE — Therapy (Signed)
[place], [place], [address] Phone: [phone]   Fax:  [phone]  Physical Therapy Treatment  Patient Details  Name: Alison Cuevas MRN: [id number] Date of Birth: 09/02/1945 Referring Provider (PT): Alger Simons MD   Encounter Date: 11/16/2020   PT End of Session - 11/16/20 1035    Visit Number 21    Number of Visits 24    Date for PT Re-Evaluation 11/25/20    Authorization Type Medicare A/ [place] NAP 2ndary    Progress Note Due on Visit 24    PT Start Time 1033    PT Stop Time 1114    PT Time Calculation (min) 41 min    Equipment Utilized During Treatment Gait belt    Activity Tolerance Patient tolerated treatment well    Behavior During Therapy [facility] for tasks assessed/performed           Past Medical History:  Diagnosis Date  . Allergic rhinitis    maple pollen  . Anxiety   . Arthritis   . Bowel obstruction ([place])   . Chronic low back pain    Dr. Trenton Gammon.  Has had back injection--bp elevated after.  . Chronic pain syndrome   . Chronic renal insufficiency, stage 2 (mild)    GFR 60-70  . DDD (degenerative disc disease), cervical    Hx of ACDF (Dr. [name]).  Followed by Dr. [name].  Also, Dr. [name] do left C7-T1 intralaminal epidural injection.  . Diverticulosis of colon   . DJD (degenerative joint disease)   . Gallstones   . GERD (gastroesophageal reflux disease)   . Herpes zoster 07/08/2014  . 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  Resid R arm and leg weakness + flexion contractures.  . Hypercholesteremia    mild; pt declined statin trial 05/2014--needs recheck lipid panel at first f/u visit in 2017  . Hypertension   . Osteopenia 06/2017   T score -1.4 FRAX 15% / 2%  . Ovarian cyst 2017   82019-robotic assisted bilatel SPO: all path benign.  . Perianal dermatitis    prn cutivate  . Phlebitis    . Right hemiplegia ([place]) 01/2020   R arm and leg (hemorrhagic L cerebral hemisphere CVA)  . Right knee injury 2018   Patellofemoral crush injury--Dr. [name].  . Stroke ([place])   . Trochanteric bursitis of right hip    Recurrent (injection by Dr. [name] 05/26/15)  . Tympanic membrane rupture, right 12/01/2016   As of 08/2018 pt set for tympanomastoidectomy and STSG (Dr. [name]). Recurrent fungal/bact OE.  . Varicose vein    left leg     Past Surgical History:  Procedure Laterality Date  . ABDOMINAL HYSTERECTOMY  03/2015   TAH.  Last pap 2016.  No hx of abnl paps.  Per GYN, no further pap smears indicated.  . ANTERIOR AND POSTERIOR REPAIR N/A 03/18/2014   Procedure: Cystocele repair with graft, Vault suspension, Rectocele repair;  Surgeon: [name], MD;  Location: [facility];  Service: Urology;  Laterality: N/A;  . CHOLECYSTECTOMY    . CHOLECYSTECTOMY, LAPAROSCOPIC    . COLON SURGERY    . COLONOSCOPY  04/2006; 05/26/16   2007 (Dr. [name]): Normal.  04/2016 (Dr. [name]) normal except diverticulosis and decreased anal sphincter tone.  No repeat colonoscopy is recommended due to age.  . cspine surgery     Dr. Wiliam Ke level ant cerv discectomy [name]  w/plating  . CYSTO N/A 03/18/2014   Procedure: CYSTO;  Surgeon: [name], MD;  Location: [facility];  Service: Urology;  Laterality: N/A;  . DEXA  07/30/2015; 06/2018   2017 and 2020 -->Osteopenia--repeat 2 yrs.  Marland Kitchen LYSIS OF ADHESION N/A 01/31/2018   Procedure: POSSIBLE LYSIS OF ADHESION;  Surgeon: Everitt Amber, MD;  [facility]: [facility];  Service: Gynecology;  Laterality: N/A;  . [place]     with prolasped bledder repair  . RESECTION OF COLON     BENIGN TUMOR  . RIGHT EAR SURGERY  08/23/2017   Dr. 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 right ear surgery  12/12/2018   Right  tympanomastoidectomy, ossiculoplasty with partial prosthesis, split thickness skin graft from postauricular skin 1x1cm, and right tragal cartilage graft 1x1 cm [facility])  . right hemicolectomy for diverticulitis with abscess  1993  . ROBOTIC ASSISTED BILATERAL SALPINGO OOPHERECTOMY Bilateral 01/31/2018   All PATH benign.  Procedure: XI ROBOTIC ASSISTED BILATERAL SALPINGO OOPHORECTOMY;  Surgeon: Everitt Amber, MD;  [facility];  Service: Gynecology;  Laterality: Bilateral;  . TRANSTHORACIC ECHOCARDIOGRAM  01/27/2020   EF 65-70%, NORMAL.  . TUBAL LIGATION      There were no vitals filed for this visit.   Subjective Assessment - 11/16/20 1035    Subjective "My leg is doing". Plans to order some new shoes soon.    Pertinent History LT CVA 01/26/20    Limitations Standing;Walking;House hold activities    Patient Stated Goals Walk more/ better    Currently in Pain? Yes   shoulder "not good"   Pain Onset More than a month ago                             [facility] Adult PT Treatment/Exercise - 11/16/20 0001      Knee/Hip Exercises: Standing   Forward Step Up Both;10 reps;Hand Hold: 2;Step Height: 4";1 set    Gait Training 226 feet with hemi walker x 2 rounds with cues for LT side awareness and heel strike    Other Standing Knee Exercises alt step taps 4 inch x 20, cone tap FWD and across midline 2 x 10 with HHA x 2, then x10 each with HHA x1    Other Standing Knee Exercises picking item form floor HHA x1 (3 x)      Knee/Hip Exercises: Seated   Sit to Sand 2 sets;5 reps;with UE support                    PT Short Term Goals - 10/29/20 1696      PT SHORT TERM GOAL #1   Title Patient will be independent with initial HEP and self-management strategies to improve functional outcomes    Time 4    Period Weeks    Status On-going    Target Date 10/16/20      PT SHORT TERM GOAL #2   Title Patient will be able to ambulate at least 100 feet during 2MWT with LRAD to  demonstrate improved ability to perform functional mobility and associated tasks.    Baseline 170 feet with quad cane    Time 4    Period Weeks    Status Achieved    Target Date 10/16/20  PT Long Term Goals - 10/29/20 0844      PT LONG TERM GOAL #1   Title Patient will be able to ambulate at least 225 feet during 2MWT with LRAD to demonstrate improved ability to perform functional mobility and associated tasks.    Baseline 170 feet with quad cane    Time 8    Period Weeks    Status On-going      PT LONG TERM GOAL #2   Title Patient will have equal to or > 4/5 MMT throughout RLE to improve ability to perform functional mobility, stair ambulation and ADLs.    Baseline See MMT    Time 8    Period Weeks    Status Partially Met      PT LONG TERM GOAL #3   Title Patient will be able to maintain semi tandem stance >30 seconds on BLEs to improve stability and reduce risk for falls    Baseline See balance    Time 8    Period Weeks    Status On-going                 Plan - 11/16/20 1138    Clinical Impression Statement Continued with gait, balance and positional awareness. Added supported cone taps today. Patient did well with this using only HHA x 1. Patient showing good improvements overall and increasing ambulation distance. Patient continues to complain of tone in RT foot, and requires verbal cues for heel strike when ambulating. Patient also demos decreased positional awareness with wanting to stay close to LT side of wall, as noted last week. She is able to correct with verbal cues, but did have LOB x 1 due to catching hemi walker in door way. No fall, repositioned with therapist help.    Personal Factors and Comorbidities Age;Comorbidity 1    Comorbidities CVA    Examination-Activity Limitations Locomotion Level;Bed Mobility;Stand;Stairs;Transfers    Examination-Participation Restrictions Community Activity;Yard Work;Laundry;Driving;Cleaning     Stability/Clinical Decision Making Stable/Uncomplicated    Rehab Potential Good    PT Frequency 2x / week    PT Duration 4 weeks    PT Treatment/Interventions ADLs/Self Care Home Management;Aquatic Therapy;Iontophoresis 45m/ml Dexamethasone;DME Instruction;Balance training;Passive range of motion;Scar mobilization;Neuromuscular re-education;Gait training;Stair training;Functional mobility training;Patient/family education;Manual techniques;Dry needling;Energy conservation;Manual lymph drainage;Parrafin;Ultrasound;Therapeutic activities;Canalith Repostioning;Moist Heat;Traction;Biofeedback;Cryotherapy;Fluidtherapy;Electrical Stimulation;Contrast Bath;Therapeutic exercise;Orthotic Fit/Training;Compression bandaging;Splinting;Taping;Vasopneumatic Device;Joint Manipulations;Spinal Manipulations;Visual/perceptual remediation/compensation;Vestibular    PT Next Visit Plan F/U about JAS brace. Continue standing LE strength, static balance and gait. Progress gait and balance as able. Cone taps    PT Home Exercise Plan Eval: seated march, heel/toe raise, LAQs; 09/17/20: STS, standing abduction and marching wiht UE support counter 3/30 staggered stance with support 4/20 bridge, clam 4/27 sidestepping    Consulted and Agree with Plan of Care Patient           Patient will benefit from skilled therapeutic intervention in order to improve the following deficits and impairments:  Pain,Improper body mechanics,Abnormal gait,Decreased balance,Decreased activity tolerance,Decreased range of motion,Decreased strength,Hypomobility,Decreased mobility,Impaired sensation,Difficulty walking  Visit Diagnosis: Other symptoms and signs involving the musculoskeletal system  Other lack of coordination  Other abnormalities of gait and mobility  Muscle weakness (generalized)     Problem List Patient Active Problem List   Diagnosis Date Noted  . Pain in joint of right foot 04/15/2020  . Right spastic hemiparesis ([place]  03/04/2020  . Chronic otitis externa of right ear 02/20/2020  . Intraparenchymal hemorrhage of brain ([place]   . Delirium   . ICH (intracerebral  hemorrhage) ([place]) - L frontoparietal, hypertensive 01/26/2020  . Stroke (cerebrum) ([place]) 01/26/2020  . Conductive hearing loss of right ear with unrestricted hearing of left ear 05/28/2019  . Hyponatremia 08/31/2017  . Generalized weakness 08/31/2017  . Dehydration 08/31/2017  . UTI (urinary tract infection) 08/31/2017  . Tympanic membrane rupture, right 12/01/2016  . Right ovarian cyst 08/22/2016  . Fibromyalgia 06/03/2015  . Cystocele 03/18/2014  . 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 Diverticulosis of colon without hemorrhage 06/12/2013  . Hypertension   . Osteopenia   . Chronic low back pain 05/12/2011  . HYPERCHOLESTEROLEMIA 03/17/2008  . Anxiety state 09/17/2007  . GERD 09/17/2007  . DJD (degenerative joint disease) 09/17/2007    11:43 AM, 11/16/20 Josue Hector PT DPT  Physical Therapist with [facility]  [phone] [place] [address] [place], [place], [address] Phone: [phone]   Fax:  [phone]  Name: Raveen Wieseler MRN: [id number] Date of Birth: 1945/12/10

## 2020-11-16 NOTE — Therapy (Signed)
[place], [place], [address] Phone: [phone]   Fax:  [phone]  Occupational Therapy Treatment  Patient Details  Name: Alison Cuevas MRN: [id number] Date of Birth: 09-Feb-1946 Referring Provider (OT): Alger Simons, MD   Encounter Date: 11/16/2020   OT End of Session - 11/16/20 1118    Visit Number 18    Number of Visits 25    Date for OT Re-Evaluation 12/09/20    Authorization Type double coverage Medicare 80/20% secondary: [employer] Covers what medicare doesn't.    Progress Note Due on Visit 27    OT Start Time [phone]    OT Stop Time 1030    OT Time Calculation (min) 43 min    Activity Tolerance Patient tolerated treatment well    Behavior During Therapy WFL for tasks assessed/performed           Past Medical History:  Diagnosis Date  . Allergic rhinitis    maple pollen  . Anxiety   . Arthritis   . Bowel obstruction ([place])   . Chronic low back pain    Dr. Trenton Gammon.  Has had back injection--bp elevated after.  . Chronic pain syndrome   . Chronic renal insufficiency, stage 2 (mild)    GFR 60-70  . DDD (degenerative disc disease), cervical    Hx of ACDF (Dr. [name]).  Followed by Dr. [name].  Also, Dr. [name] do left C7-T1 intralaminal epidural injection.  . Diverticulosis of colon   . DJD (degenerative joint disease)   . Gallstones   . GERD (gastroesophageal reflux disease)   . Herpes zoster 07/08/2014  . 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  Resid R arm and leg weakness + flexion contractures.  . Hypercholesteremia    mild; pt declined statin trial 05/2014--needs recheck lipid panel at first f/u visit in 2017  . Hypertension   . Osteopenia 06/2017   T score -1.4 FRAX 15% / 2%  . Ovarian cyst 2017   82019-robotic assisted bilatel SPO: all path benign.  . Perianal dermatitis    prn cutivate  .  Phlebitis   . Right hemiplegia ([place]) 01/2020   R arm and leg (hemorrhagic L cerebral hemisphere CVA)  . Right knee injury 2018   Patellofemoral crush injury--Dr. [name].  . Stroke ([place])   . Trochanteric bursitis of right hip    Recurrent (injection by Dr. [name] 05/26/15)  . Tympanic membrane rupture, right 12/01/2016   As of 08/2018 pt set for tympanomastoidectomy and STSG (Dr. [name]). Recurrent fungal/bact OE.  . Varicose vein    left leg     Past Surgical History:  Procedure Laterality Date  . ABDOMINAL HYSTERECTOMY  03/2015   TAH.  Last pap 2016.  No hx of abnl paps.  Per GYN, no further pap smears indicated.  . ANTERIOR AND POSTERIOR REPAIR N/A 03/18/2014   Procedure: Cystocele repair with graft, Vault suspension, Rectocele repair;  Surgeon: [name], MD;  [facility];  Service: Urology;  Laterality: N/A;  . CHOLECYSTECTOMY    . CHOLECYSTECTOMY, LAPAROSCOPIC    . COLON SURGERY    . COLONOSCOPY  04/2006; 05/26/16   2007 (Dr. [name]): Normal.  04/2016 (Dr. [name]) normal except diverticulosis and decreased anal sphincter tone.  No repeat colonoscopy is recommended due to age.  . cspine surgery     Dr. Wiliam Ke level ant cerv discectomy /fusion w/plating  . CYSTO  N/A 03/18/2014   Procedure: Kathrene Alu;  Surgeon: [name], MD;  [facility];  Service: Urology;  Laterality: N/A;  . DEXA  07/30/2015; 06/2018   2017 and 2020 -->Osteopenia--repeat 2 yrs.  Marland Kitchen LYSIS OF ADHESION N/A 01/31/2018   Procedure: POSSIBLE LYSIS OF ADHESION;  Surgeon: Everitt Amber, MD;  [facility];  Service: Gynecology;  Laterality: N/A;  . [place]     with prolasped bledder repair  . RESECTION OF COLON     BENIGN TUMOR  . RIGHT EAR SURGERY  08/23/2017   Dr. 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 right ear surgery  12/12/2018    Right tympanomastoidectomy, ossiculoplasty with partial prosthesis, split thickness skin graft from postauricular skin 1x1cm, and right tragal cartilage graft 1x1 cm [facility])  . right hemicolectomy for diverticulitis with abscess  1993  . ROBOTIC ASSISTED BILATERAL SALPINGO OOPHERECTOMY Bilateral 01/31/2018   All PATH benign.  Procedure: XI ROBOTIC ASSISTED BILATERAL SALPINGO OOPHORECTOMY;  Surgeon: Everitt Amber, MD;  [facility];  Service: Gynecology;  Laterality: Bilateral;  . TRANSTHORACIC ECHOCARDIOGRAM  01/27/2020   EF 65-70%, NORMAL.  . TUBAL LIGATION      There were no vitals filed for this visit.   Subjective Assessment - 11/16/20 1034    Subjective  S: I haven't slept on my side for a year.    Currently in Pain? Yes    Pain Score --   no number. Not good.   Pain Location Arm    Pain Orientation Right    Pain Descriptors / Indicators Discomfort    Pain Type Acute pain    Pain Onset More than a month ago    Pain Frequency Constant    Aggravating Factors  increased use and above the shoulder movement    Pain Relieving Factors kinsiotape helped when applied.    Effect of Pain on Daily Activities mod- max effect    Multiple Pain Sites No                        OT Treatments/Exercises (OP) - 11/16/20 1036      Bed Mobility   Bed Mobility Rolling Left    Rolling Left --   modified independent     Transfers   Transfers Sit to Stand;Stand to Sit    Sit to Stand 6: Modified independent (Device/Increase time)    Stand to Sit 6: Modified independent (Device/Increase time)      ADLs   ADL Comments Sidelying bed positioning completed      Exercises   Exercises Shoulder      Shoulder Exercises: ROM/Strengthening   UBE (Upper Arm Bike) level 1 2.5' forward 2.5' backward pace: 3.0                  OT Education - 11/16/20 1032    Education Details Education provided for sidelying positioning using pillows with handout provided.    Person(s) Educated  Patient    Methods Explanation;Handout;Verbal cues;Demonstration    Comprehension Verbalized understanding;Returned demonstration            OT Short Term Goals - 10/19/20 1246      OT SHORT TERM GOAL #1   Title Pt will be educated and independent with HEP in order to faciliate progress in therapy and work towards increasing her RUE strength  and coordination while increasing her functional performance during ADL tasks.    Time 3    Period Weeks    Target Date 09/23/20             OT Long Term Goals - 11/16/20 1126      OT LONG TERM GOAL #1   Title Patient will increase right shoulder strength and elbow to 5/5 while demonstrating increased shoulder and scapular stability when reaching away from her body/base of support.    Time 6    Period Weeks    Status On-going      OT LONG TERM GOAL #2   Title Patient will increase right hand coordination by completing the 9 hole peg test in 30 seconds or less in order to roll hair with less difficulty.    Time 6    Period Weeks    Status On-going      OT LONG TERM GOAL #3   Title Patient will increase right hand grip strength by 10# and pinch strength by 5# in order to increase ability to grasp and manipulate items with less difficulty.    Time 6    Period Weeks      OT LONG TERM GOAL #4   Title patient will reach highest level of independence with increased confidence and safety awareness while completing all ADL tasks at modified independent level or better while utilizing adaptive equipment and/or compensatory strategies if needed.    Time 6    Period Weeks      OT LONG TERM GOAL #5   Title Patient will increase her gross motor coordination in her right UE by moving 38 blocks within a minute during the box and blocks test in order to increase her ability to roll her hair with modified independence.    Time 4    Period Weeks    Status On-going    Target Date 12/09/20      OT LONG TERM GOAL #6   Title Patient will be able to  demonstrate  increased LUE strength and endurance in order to complete simple housekeeping tasks such as cleaning/dusting and making her bed with supervision if needed.    Time 4    Period Weeks    Status On-going                 Plan - 11/16/20 1119    Clinical Impression Statement A: Decided to wait until next session to take right shoulder so therapist will be able to remove tape in clinic at 5 days or later. Session focused on bed positioning techniques with patient completing with VC and guidance from OT. Provide visual print out for home. Discussed options and strategies to complete at home in least restrictive way. Completed UBE bike at end of sessionfor shoulder and scapular strengthening. Did not have time for hair rolling due to time constraint.    Body Structure / Function / Physical Skills ADL;UE functional use;FMC;ROM;Proprioception;Coordination;GMC;Strength    Plan P: Focus on increasing motor control of right arm while completing functional daily tasks (fixing hair with rollers, dusting, making bed, cleaning).  Patient to bring in rolers and bobbie pins to work on ability to fix hair with less difficulty. Assess requirements of task. Reapply kinsiotape to right shoulder for pain management and joint stability.    Consulted and Agree with Plan of Care Patient           Patient will benefit from skilled therapeutic intervention in order to improve  the following deficits and impairments:   Body Structure / Function / Physical Skills: ADL,UE functional use,FMC,ROM,Proprioception,Coordination,GMC,Strength       Visit Diagnosis: Other symptoms and signs involving the musculoskeletal system  Other lack of coordination    Problem List Patient Active Problem List   Diagnosis Date Noted  . Pain in joint of right foot 04/15/2020  . Right spastic hemiparesis ([place]) 03/04/2020  . Chronic otitis externa of right ear 02/20/2020  . Intraparenchymal hemorrhage of brain ([place])    . Delirium   . 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 Stroke (cerebrum) ([place]) 01/26/2020  . Conductive hearing loss of right ear with unrestricted hearing of left ear 05/28/2019  . Hyponatremia 08/31/2017  . Generalized weakness 08/31/2017  . Dehydration 08/31/2017  . UTI (urinary tract infection) 08/31/2017  . Tympanic membrane rupture, right 12/01/2016  . Right ovarian cyst 08/22/2016  . Fibromyalgia 06/03/2015  . Cystocele 03/18/2014  . 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 Diverticulosis of colon without hemorrhage 06/12/2013  . Hypertension   . Osteopenia   . Chronic low back pain 05/12/2011  . HYPERCHOLESTEROLEMIA 03/17/2008  . Anxiety state 09/17/2007  . GERD 09/17/2007  . DJD (degenerative joint disease) 09/17/2007    Ailene Ravel, OTR/L,CBIS  [phone]  11/16/2020, 11:26 AM  [place], [place], [address] Phone: [phone]   Fax:  [phone]  Name: Alison Cuevas MRN: [id number] Date of Birth: Oct 16, 1945

## 2020-11-18 ENCOUNTER — Ambulatory Visit (HOSPITAL_COMMUNITY): Payer: Medicare Other

## 2020-11-18 ENCOUNTER — Encounter (HOSPITAL_COMMUNITY): Payer: Self-pay

## 2020-11-18 ENCOUNTER — Other Ambulatory Visit: Payer: Self-pay

## 2020-11-18 DIAGNOSIS — R29898 Other symptoms and signs involving the musculoskeletal system: Secondary | ICD-10-CM

## 2020-11-18 DIAGNOSIS — R278 Other lack of coordination: Secondary | ICD-10-CM | POA: Diagnosis not present

## 2020-11-18 DIAGNOSIS — R2689 Other abnormalities of gait and mobility: Secondary | ICD-10-CM

## 2020-11-18 DIAGNOSIS — M6281 Muscle weakness (generalized): Secondary | ICD-10-CM

## 2020-11-18 NOTE — Therapy (Signed)
[place], [place], [address] Phone: [phone]   Fax:  [phone]  Physical Therapy Treatment  Patient Details  Name: Alison Cuevas MRN: [id number] Date of Birth: 08-31-1945 Referring Provider (PT): Alger Simons MD   Encounter Date: 11/18/2020   PT End of Session - 11/18/20 1504    Visit Number 22    Number of Visits 24    Date for PT Re-Evaluation 11/25/20    Authorization Type Medicare A/ [place] NAP 2ndary    Progress Note Due on Visit 24    PT Start Time 1430    PT Stop Time 1515    PT Time Calculation (min) 45 min    Equipment Utilized During Treatment Gait belt    Activity Tolerance Patient tolerated treatment well    Behavior During Therapy [facility] for tasks assessed/performed           Past Medical History:  Diagnosis Date  . Allergic rhinitis    maple pollen  . Anxiety   . Arthritis   . Bowel obstruction ([place])   . Chronic low back pain    Dr. Trenton Gammon.  Has had back injection--bp elevated after.  . Chronic pain syndrome   . Chronic renal insufficiency, stage 2 (mild)    GFR 60-70  . DDD (degenerative disc disease), cervical    Hx of ACDF (Dr. [name]).  Followed by Dr. [name].  Also, Dr. [name] do left C7-T1 intralaminal epidural injection.  . Diverticulosis of colon   . DJD (degenerative joint disease)   . Gallstones   . GERD (gastroesophageal reflux disease)   . Herpes zoster 07/08/2014  . 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  Resid R arm and leg weakness + flexion contractures.  . Hypercholesteremia    mild; pt declined statin trial 05/2014--needs recheck lipid panel at first f/u visit in 2017  . Hypertension   . Osteopenia 06/2017   T score -1.4 FRAX 15% / 2%  . Ovarian cyst 2017   82019-robotic assisted bilatel SPO: all path benign.  . Perianal dermatitis    prn cutivate  . Phlebitis    . Right hemiplegia ([place]) 01/2020   R arm and leg (hemorrhagic L cerebral hemisphere CVA)  . Right knee injury 2018   Patellofemoral crush injury--Dr. [name].  . Stroke ([place])   . Trochanteric bursitis of right hip    Recurrent (injection by Dr. [name] 05/26/15)  . Tympanic membrane rupture, right 12/01/2016   As of 08/2018 pt set for tympanomastoidectomy and STSG (Dr. [name]). Recurrent fungal/bact OE.  . Varicose vein    left leg     Past Surgical History:  Procedure Laterality Date  . ABDOMINAL HYSTERECTOMY  03/2015   TAH.  Last pap 2016.  No hx of abnl paps.  Per GYN, no further pap smears indicated.  . ANTERIOR AND POSTERIOR REPAIR N/A 03/18/2014   Procedure: Cystocele repair with graft, Vault suspension, Rectocele repair;  Surgeon: [name], MD;  Location: [facility];  Service: Urology;  Laterality: N/A;  . CHOLECYSTECTOMY    . CHOLECYSTECTOMY, LAPAROSCOPIC    . COLON SURGERY    . COLONOSCOPY  04/2006; 05/26/16   2007 (Dr. [name]): Normal.  04/2016 (Dr. [name]) normal except diverticulosis and decreased anal sphincter tone.  No repeat colonoscopy is recommended due to age.  . cspine surgery     Dr. Wiliam Ke level ant cerv discectomy [name]  w/plating  . CYSTO N/A 03/18/2014   Procedure: CYSTO;  Surgeon: [name], MD;  Location: [facility];  Service: Urology;  Laterality: N/A;  . DEXA  07/30/2015; 06/2018   2017 and 2020 -->Osteopenia--repeat 2 yrs.  Marland Kitchen LYSIS OF ADHESION N/A 01/31/2018   Procedure: POSSIBLE LYSIS OF ADHESION;  Surgeon: Everitt Amber, MD;  Location: [facility];  Service: Gynecology;  Laterality: N/A;  . [place]     with prolasped bledder repair  . RESECTION OF COLON     BENIGN TUMOR  . RIGHT EAR SURGERY  08/23/2017   Dr. 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 right ear surgery  12/12/2018   Right  tympanomastoidectomy, ossiculoplasty with partial prosthesis, split thickness skin graft from postauricular skin 1x1cm, and right tragal cartilage graft 1x1 cm [facility])  . right hemicolectomy for diverticulitis with abscess  1993  . ROBOTIC ASSISTED BILATERAL SALPINGO OOPHERECTOMY Bilateral 01/31/2018   All PATH benign.  Procedure: XI ROBOTIC ASSISTED BILATERAL SALPINGO OOPHORECTOMY;  Surgeon: Everitt Amber, MD;  [facility]: [facility];  Service: Gynecology;  Laterality: Bilateral;  . TRANSTHORACIC ECHOCARDIOGRAM  01/27/2020   EF 65-70%, NORMAL.  . TUBAL LIGATION      There were no vitals filed for this visit.   Subjective Assessment - 11/18/20 1545    Subjective Notes increased difficulty with right ankle control, "my ankle keeps turning out when i walk"    Pertinent History LT CVA 01/26/20    Limitations Standing;Walking;House hold activities    Patient Stated Goals Walk more/ better    Pain Onset More than a month ago                             [facility] Adult PT Treatment/Exercise - 11/18/20 1504      Ambulation/Gait   Ambulation/Gait Yes    Ambulation/Gait Assistance 5: Supervision    Ambulation Distance (Feet) 250 Feet    Assistive device Rolling walker    Gait Pattern Step-through pattern;Abducted- right    Ambulation Surface Level;Indoor      Knee/Hip Exercises: Stretches   Passive Hamstring Stretch Right;4 reps;60 seconds    ITB Stretch Right;2 reps;60 seconds    Other Knee/Hip Stretches right hip adductor stretch 3 x 60 sec to decrease tone/spasticity      Knee/Hip Exercises: Standing   Other Standing Knee Exercises elevating LLE on 6" step with therapist holding right foot to plantigrade to stretch plantarflexors to reduce spasticity/tone to improve RLE weight acceptance 5x60 sec. Stair taps with LLE 3x10 for dynamic loading RLE.      Knee/Hip Exercises: Seated   Long Arc Quad Strengthening;Right;3 sets;10 reps    Long Arc Quad Weight 6 lbs.                     PT Short Term Goals - 10/29/20 0853      PT SHORT TERM GOAL #1   Title Patient will be independent with initial HEP and self-management strategies to improve functional outcomes    Time 4    Period Weeks    Status On-going    Target Date 10/16/20      PT SHORT TERM GOAL #2   Title Patient will be able to ambulate at least 100 feet during 2MWT with LRAD to demonstrate improved ability to perform functional  mobility and associated tasks.    Baseline 170 feet with quad cane    Time 4    Period Weeks    Status Achieved    Target Date 10/16/20             PT Long Term Goals - 10/29/20 0844      PT LONG TERM GOAL #1   Title Patient will be able to ambulate at least 225 feet during 2MWT with LRAD to demonstrate improved ability to perform functional mobility and associated tasks.    Baseline 170 feet with quad cane    Time 8    Period Weeks    Status On-going      PT LONG TERM GOAL #2   Title Patient will have equal to or > 4/5 MMT throughout RLE to improve ability to perform functional mobility, stair ambulation and ADLs.    Baseline See MMT    Time 8    Period Weeks    Status Partially Met      PT LONG TERM GOAL #3   Title Patient will be able to maintain semi tandem stance >30 seconds on BLEs to improve stability and reduce risk for falls    Baseline See balance    Time 8    Period Weeks    Status On-going                 Plan - 11/18/20 1545    Clinical Impression Statement Tolerated tx session well and able to demonstrate good right foot platigrade with positioning in standing wilst LLE elevated on step with therapist providing overpressure to right talo-crural joint to improve stretch to gastroc/soleus to improve tibial advancement to improve gait mechanics.  Pt reports relief with stretching to right ankle plantarflexors    Personal Factors and Comorbidities Age;Comorbidity 1    Comorbidities CVA    Examination-Activity Limitations  Locomotion Level;Bed Mobility;Stand;Stairs;Transfers    Examination-Participation Restrictions Community Activity;Yard Work;Laundry;Driving;Cleaning    Stability/Clinical Decision Making Stable/Uncomplicated    Rehab Potential Good    PT Frequency 2x / week    PT Duration 4 weeks    PT Treatment/Interventions ADLs/Self Care Home Management;Aquatic Therapy;Iontophoresis 87m/ml Dexamethasone;DME Instruction;Balance training;Passive range of motion;Scar mobilization;Neuromuscular re-education;Gait training;Stair training;Functional mobility training;Patient/family education;Manual techniques;Dry needling;Energy conservation;Manual lymph drainage;Parrafin;Ultrasound;Therapeutic activities;Canalith Repostioning;Moist Heat;Traction;Biofeedback;Cryotherapy;Fluidtherapy;Electrical Stimulation;Contrast Bath;Therapeutic exercise;Orthotic Fit/Training;Compression bandaging;Splinting;Taping;Vasopneumatic Device;Joint Manipulations;Spinal Manipulations;Visual/perceptual remediation/compensation;Vestibular    PT Next Visit Plan F/U about JAS brace. Continue standing LE strength, static balance and gait. Progress gait and balance as able. Cone taps    PT Home Exercise Plan Eval: seated march, heel/toe raise, LAQs; 09/17/20: STS, standing abduction and marching wiht UE support counter 3/30 staggered stance with support 4/20 bridge, clam 4/27 sidestepping    Consulted and Agree with Plan of Care Patient           Patient will benefit from skilled therapeutic intervention in order to improve the following deficits and impairments:  Pain,Improper body mechanics,Abnormal gait,Decreased balance,Decreased activity tolerance,Decreased range of motion,Decreased strength,Hypomobility,Decreased mobility,Impaired sensation,Difficulty walking  Visit Diagnosis: Other symptoms and signs involving the musculoskeletal system  Other lack of coordination  Other abnormalities of gait and mobility  Muscle weakness  (generalized)     Problem List Patient Active Problem List   Diagnosis Date Noted  . Pain in joint of right foot 04/15/2020  . Right spastic hemiparesis ([facility] 03/04/2020  . Chronic otitis externa of right ear 02/20/2020  . Intrap[place]renchymal hemorrhage of brain [place]   . Delirium   . ICH (  intracerebral hemorrhage) ([facility]) - L frontoparietal, hypertensive 01/26/2020  . Stroke (cerebrum) ([place]) 01/26/2020  . Conductive hearing loss of right ear with unrestricted hearing of left ear 05/28/2019  . Hyponatremia 08/31/2017  . Generalized weakness 08/31/2017  . Dehydration 08/31/2017  . UTI (urinary tract infection) 08/31/2017  . Tympanic membrane rupture, right 12/01/2016  . Right ovarian cyst 08/22/2016  . Fibromyalgia 06/03/2015  . Cystocele 03/18/2014  . 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 Diverticulosis of colon without hemorrhage 06/12/2013  . Hypertension   . Osteopenia   . Chronic low back pain 05/12/2011  . HYPERCHOLESTEROLEMIA 03/17/2008  . Anxiety state 09/17/2007  . GERD 09/17/2007  . DJD (degenerative joint disease) 09/17/2007   3:48 PM, 11/18/20 M. Sherlyn Lees, PT, DPT Physical Therapist- [place] Office Number: [phone]  [place] [address] [place], [place], [address] Phone: [phone]   Fax:  [phone]  Name: Alison Cuevas MRN: [id number] Date of Birth: [birth year]

## 2020-11-18 NOTE — Therapy (Signed)
[place], [place], [address] Phone: [phone]   Fax:  [phone]  Occupational Therapy Treatment  Patient Details  Name: Alison Cuevas MRN: [id number] Date of Birth: 03/20/1946 Referring Provider (OT): Alger Simons, MD   Encounter Date: 11/18/2020   OT End of Session - 11/18/20 1507    Visit Number 19    Number of Visits 25    Date for OT Re-Evaluation 12/09/20    Authorization Type double coverage Medicare 80/20% secondary: [employer] Covers what medicare doesn't.    Progress Note Due on Visit 27    OT Start Time 1345    OT Stop Time 1430    OT Time Calculation (min) 45 min    Activity Tolerance Patient tolerated treatment well    Behavior During Therapy --   sad. demonstrating depressive type behavior related to recent stroke and husband passing. Provided emotional support and encouraged seeking out additional help from a counselor that specilizes in grief if needed to work through this difficult time.          Past Medical History:  Diagnosis Date  . Allergic rhinitis    maple pollen  . Anxiety   . Arthritis   . Bowel obstruction ([place])   . Chronic low back pain    Dr. Trenton Gammon.  Has had back injection--bp elevated after.  . Chronic pain syndrome   . Chronic renal insufficiency, stage 2 (mild)    GFR 60-70  . DDD (degenerative disc disease), cervical    Hx of ACDF (Dr. [name]).  Followed by Dr. [name].  Also, Dr. [name] do left C7-T1 intralaminal epidural injection.  . Diverticulosis of colon   . DJD (degenerative joint disease)   . Gallstones   . GERD (gastroesophageal reflux disease)   . Herpes zoster 07/08/2014  . 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  Resid R arm and leg weakness + flexion contractures.  . Hypercholesteremia    mild; pt declined statin trial 05/2014--needs recheck lipid panel at first  f/u visit in 2017  . Hypertension   . Osteopenia 06/2017   T score -1.4 FRAX 15% / 2%  . Ovarian cyst 2017   82019-robotic assisted bilatel SPO: all path benign.  . Perianal dermatitis    prn cutivate  . Phlebitis   . Right hemiplegia ([place]) 01/2020   R arm and leg (hemorrhagic L cerebral hemisphere CVA)  . Right knee injury 2018   Patellofemoral crush injury--Dr. [name].  . Stroke ([place])   . Trochanteric bursitis of right hip    Recurrent (injection by Dr. [name] 05/26/15)  . Tympanic membrane rupture, right 12/01/2016   As of 08/2018 pt set for tympanomastoidectomy and STSG (Dr. [name]). Recurrent fungal/bact OE.  . Varicose vein    left leg     Past Surgical History:  Procedure Laterality Date  . ABDOMINAL HYSTERECTOMY  03/2015   TAH.  Last pap 2016.  No hx of abnl paps.  Per GYN, no further pap smears indicated.  . ANTERIOR AND POSTERIOR REPAIR N/A 03/18/2014   Procedure: Cystocele repair with graft, Vault suspension, Rectocele repair;  Surgeon: [name], MD;  [facility];  Service: Urology;  Laterality: N/A;  . CHOLECYSTECTOMY    . CHOLECYSTECTOMY, LAPAROSCOPIC    . COLON SURGERY    . COLONOSCOPY  04/2006; 05/26/16   2007 (Dr. [name]): Normal.  04/2016 (Dr. [name]) normal except  diverticulosis and decreased anal sphincter tone.  No repeat colonoscopy is recommended due to age.  . cspine surgery     Dr. Wiliam Ke level ant cerv discectomy /fusion w/plating  . CYSTO N/A 03/18/2014   Procedure: CYSTO;  Surgeon: [name], MD;  [facility];  Service: Urology;  Laterality: N/A;  . DEXA  07/30/2015; 06/2018   2017 and 2020 -->Osteopenia--repeat 2 yrs.  Marland Kitchen LYSIS OF ADHESION N/A 01/31/2018   Procedure: POSSIBLE LYSIS OF ADHESION;  Surgeon: Everitt Amber, MD;  [facility];  Service: Gynecology;  Laterality: N/A;  . [place]     with prolasped bledder repair  . RESECTION OF COLON     BENIGN TUMOR  . RIGHT EAR SURGERY  08/23/2017   Dr. 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 right ear surgery  12/12/2018   Right tympanomastoidectomy, ossiculoplasty with partial prosthesis, split thickness skin graft from postauricular skin 1x1cm, and right tragal cartilage graft 1x1 cm [facility])  . right hemicolectomy for diverticulitis with abscess  1993  . ROBOTIC ASSISTED BILATERAL SALPINGO OOPHERECTOMY Bilateral 01/31/2018   All PATH benign.  Procedure: XI ROBOTIC ASSISTED BILATERAL SALPINGO OOPHORECTOMY;  Surgeon: Everitt Amber, MD;  [facility];  Service: Gynecology;  Laterality: Bilateral;  . TRANSTHORACIC ECHOCARDIOGRAM  01/27/2020   EF 65-70%, NORMAL.  . TUBAL LIGATION      There were no vitals filed for this visit.   Subjective Assessment - 11/18/20 1503    Subjective  S: I just want my arm to get better.    Currently in Pain? Yes   no number given. Reports shoulder is tight and it feels uncomfortable.             [facility] OT Assessment - 11/18/20 1503      Assessment   Medical Diagnosis Right side weakness S/P CVA      Precautions   Precautions Fall                    OT Treatments/Exercises (OP) - 11/18/20 1503      Exercises   Exercises Shoulder      Shoulder Exercises: Seated   Other Seated Exercises Shoulder punches. Completed alternating punches, right arm only punches, left arm only punches focusing on motor coordination prior to speed.      Shoulder Exercises: ROM/Strengthening   Proximal Shoulder Strengthening, Seated Completed proximal shoulder strengthening with right arm only then with alternating bilateral arms. Short rest break provided after each position. [place], criss cross, circles both directions, right and left arch, 10X each      Manual Therapy   Manual Therapy Taping;Other (comment)    Manual therapy comments completed separately from therapeutic  exercises    Other Manual Therapy Kinsiotape applied to right shoulder to provide functional joint positioning and increased shoulder stability. 1 I strip and 2 Y strips. Used LT kinsiotape for sensitive skin to test ease of removal.                  OT Education - 11/18/20 1507    Education Details HEP updated for motor coordination arm exercises. Proximal shoulder strengthening, shoulder punches. patient is to stop previous HEP for UB and only complete this updated version.    Person(s) Educated Patient    Methods Explanation;Handout;Verbal cues;Demonstration    Comprehension Verbalized understanding;Returned demonstration  OT Short Term Goals - 10/19/20 1246      OT SHORT TERM GOAL #1   Title Pt will be educated and independent with HEP in order to faciliate progress in therapy and work towards increasing her RUE strength and coordination while increasing her functional performance during ADL tasks.    Time 3    Period Weeks    Target Date 09/23/20             OT Long Term Goals - 11/16/20 1126      OT LONG TERM GOAL #1   Title Patient will increase right shoulder strength and elbow to 5/5 while demonstrating increased shoulder and scapular stability when reaching away from her body/base of support.    Time 6    Period Weeks    Status On-going      OT LONG TERM GOAL #2   Title Patient will increase right hand coordination by completing the 9 hole peg test in 30 seconds or less in order to roll hair with less difficulty.    Time 6    Period Weeks    Status On-going      OT LONG TERM GOAL #3   Title Patient will increase right hand grip strength by 10# and pinch strength by 5# in order to increase ability to grasp and manipulate items with less difficulty.    Time 6    Period Weeks      OT LONG TERM GOAL #4   Title patient will reach highest level of independence with increased confidence and safety awareness while completing all ADL tasks at  modified independent level or better while utilizing adaptive equipment and/or compensatory strategies if needed.    Time 6    Period Weeks      OT LONG TERM GOAL #5   Title Patient will increase her gross motor coordination in her right UE by moving 38 blocks within a minute during the box and blocks test in order to increase her ability to roll her hair with modified independence.    Time 4    Period Weeks    Status On-going    Target Date 12/09/20      OT LONG TERM GOAL #6   Title Patient will be able to demonstrate  increased LUE strength and endurance in order to complete simple housekeeping tasks such as cleaning/dusting and making her bed with supervision if needed.    Time 4    Period Weeks    Status On-going                 Plan - 11/18/20 1510    Clinical Impression Statement A: Kinsiotape was reapplied to right shoulder for shoulder pain and joint stability. Applied sensitive friendly kinsiotape LT to assess quality and how long it will last before coming off. Applied two I strips at end of tape to reinforce hold. Focused on gross motor and motor control while incorporating proximal shoulder strengthening activities. HEP was updated. Pt to complete only new HEP. VC for form and technique were provided with visual demonstration as needed.    Body Structure / Function / Physical Skills ADL;UE functional use;FMC;ROM;Proprioception;Coordination;GMC;Strength    Plan P: Follow up on new tape used. Remove or retape as needed. Continue with motor control coordination tasks. Wall wash.    OT Home Exercise Plan Eval: proximal shoulder strengthening; 3/18: shoulder A/ROM 3/21: yellow putty - pinch and grip; 3/24: shoulder strengthening (provided A/ROM HEP again) 5/25: New HEP. proximal  shoulder strengthening and shoulder punches.    Consulted and Agree with Plan of Care Patient           Patient will benefit from skilled therapeutic intervention in order to improve the following  deficits and impairments:   Body Structure / Function / Physical Skills: ADL,UE functional use,FMC,ROM,Proprioception,Coordination,GMC,Strength       Visit Diagnosis: Other lack of coordination  Other symptoms and signs involving the musculoskeletal system    Problem List Patient Active Problem List   Diagnosis Date Noted  . Pain in joint of right foot 04/15/2020  . Right spastic hemiparesis ([place]) 03/04/2020  . Chronic otitis externa of right ear 02/20/2020  . Intraparenchymal hemorrhage of brain ([place])   . Delirium   . 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 Stroke (cerebrum) ([place]) 01/26/2020  . Conductive hearing loss of right ear with unrestricted hearing of left ear 05/28/2019  . Hyponatremia 08/31/2017  . Generalized weakness 08/31/2017  . Dehydration 08/31/2017  . UTI (urinary tract infection) 08/31/2017  . Tympanic membrane rupture, right 12/01/2016  . Right ovarian cyst 08/22/2016  . Fibromyalgia 06/03/2015  . Cystocele 03/18/2014  . 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 Diverticulosis of colon without hemorrhage 06/12/2013  . Hypertension   . Osteopenia   . Chronic low back pain 05/12/2011  . HYPERCHOLESTEROLEMIA 03/17/2008  . Anxiety state 09/17/2007  . GERD 09/17/2007  . DJD (degenerative joint disease) 09/17/2007    Ailene Ravel, OTR/L,CBIS  [phone]  11/18/2020, 3:14 PM  [place] [address] [place], [place], [address] Phone: [phone]   Fax:  [phone]  Name: Vertis Bauder MRN: [id number] Date of Birth: 1946-04-01

## 2020-11-18 NOTE — Patient Instructions (Signed)
NEW HEP FOR MOTOR COORDINATION:  1) Both arms straight out in front of you, keep your elbows straight do not let them bend, the criss/cross your arms in front of you. 10 times.  2) Both arms straight out in front of you, keep elbows straight.  Make small circles in the air.  Go in one direction the go in the other (Like you are waxing something) 10 times.  3) Both arms straight out in front of you, keep elbows straight. Move your arm up while the left arm goes down as quick as you can. 10 times.  4) Right arm straight out in front of you to the right ( like you were filling a pitcher of water). Move your arm to the left in an arch as if you were dumping water out of the pitcher. Complete 10 times right and left.     SEATED PUNCHES  While seated in a chair, extend your arms forward as in punching as shown. Use controlled smooth movements. Start with only punching with the right arm. Then only punch with the left. Eventually you want to alternate right and left arm.

## 2020-11-19 ENCOUNTER — Ambulatory Visit (INDEPENDENT_AMBULATORY_CARE_PROVIDER_SITE_OTHER): Payer: Medicare Other | Admitting: Family Medicine

## 2020-11-19 ENCOUNTER — Encounter: Payer: Self-pay | Admitting: Family Medicine

## 2020-11-19 VITALS — BP 155/74 | HR 83 | Temp 98.0°F | Resp 16 | Ht 60.0 in | Wt 116.0 lb

## 2020-11-19 DIAGNOSIS — M797 Fibromyalgia: Secondary | ICD-10-CM | POA: Diagnosis not present

## 2020-11-19 DIAGNOSIS — I10 Essential (primary) hypertension: Secondary | ICD-10-CM | POA: Diagnosis not present

## 2020-11-19 DIAGNOSIS — I6930 Unspecified sequelae of cerebral infarction: Secondary | ICD-10-CM

## 2020-11-19 DIAGNOSIS — E871 Hypo-osmolality and hyponatremia: Secondary | ICD-10-CM

## 2020-11-19 DIAGNOSIS — R5381 Other malaise: Secondary | ICD-10-CM | POA: Diagnosis not present

## 2020-11-19 DIAGNOSIS — F419 Anxiety disorder, unspecified: Secondary | ICD-10-CM

## 2020-11-19 DIAGNOSIS — D696 Thrombocytopenia, unspecified: Secondary | ICD-10-CM | POA: Insufficient documentation

## 2020-11-19 NOTE — Progress Notes (Signed)
OFFICE VISIT  11/19/2020  CC:  Chief Complaint  Patient presents with  . Hyponatremia  . COPD    Here for follow up  . Ear Fullness    Complains of right ear fullness   HPI:    Patient is a 75 y.o. 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 A/P as of last visit: "Nonspecific malaise, some peri-umbilical pain. Exam reassuring. She is not sure if she is taking protonix bid or not (med list says qd) but I recommended she increase it to bid if not already doing that. Otherwise no new meds at this time. Check cbc, bmet."  INTERIM HX: Labs last visit all normal except sodium a little low at 130.  Na has consistently been in low normal or slightly low range over the years, lowest 130.  Says she is feeling fine now.  Increased her pantoprazole to bid, says stomach feeling fine since. No n/v/d.  No fevers.  No abd pain.  Bowels moving fine.  Eating and drinking well.   Anxiety level stable.  Breathing feels fine. Still getting PT, using 4 point rolling walker, says she may be switching over to hemi-walker and eventually a cane. Taking ibup and tylenol avg of once a day.  No longer on meloxicam.  Today bp at home 139/69.  Typically around 130 syst, <80 diast.    Alpraz 0.5mg  tid prn helpful and w/out adverse effect.  This also helps her fibromyalgia pain historically. PMP AWARE reviewed today: most recent rx for alpraz 0.5 was filled 11/15/20, # 50, rx by me. Most recent vicodin 5/325 rx filled 09/15/20, #15, rx by me. No red flags.  ROS as above, plus--> no fevers, no CP, no SOB, no wheezing, no cough, no dizziness, no HAs, no rashes, no melena/hematochezia.  No polyuria or polydipsia.  No myalgias or arthralgias.  No new focal weakness, new paresthesias, or tremors.  No acute vision or hearing abnormalities.  No dysuria or unusual/new  urinary urgency or frequency.  No recent changes in lower legs. No n/v/d or abd pain.  No palpitations.     Past Medical History:  Diagnosis Date  . Allergic rhinitis    maple pollen  . Anxiety   . Arthritis   . Bowel obstruction ([place])   . Chronic low back pain    Dr. Trenton Gammon.  Has had back injection--bp elevated after.  . Chronic pain syndrome   . Chronic renal insufficiency, stage 2 (mild)    GFR 60-70  . DDD (degenerative disc disease), cervical    Hx of ACDF (Dr. [name]).  Followed by Dr. [name].  Also, Dr. [name] do left C7-T1 intralaminal epidural injection.  . Diverticulosis of colon   . DJD (degenerative joint disease)   . Gallstones   . GERD (gastroesophageal reflux disease)   . Herpes zoster 07/08/2014  . 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  Resid R arm and leg weakness + flexion contractures.  . Hypercholesteremia    mild; pt declined statin trial 05/2014--needs recheck lipid panel at first f/u visit in 2017  . Hypertension   . Osteopenia 06/2017   T score -1.4 FRAX 15% / 2%  . Ovarian cyst 2017   82019-robotic assisted bilatel SPO: all path benign.  . Perianal dermatitis    prn cutivate  .  Phlebitis   . Right hemiplegia ([place]) 01/2020   R arm and leg (hemorrhagic L cerebral hemisphere CVA)  . Right knee injury 2018   Patellofemoral crush injury--Dr. [name].  . Stroke ([place])   . Trochanteric bursitis of right hip    Recurrent (injection by Dr. [name] 05/26/15)  . Tympanic membrane rupture, right 12/01/2016   As of 08/2018 pt set for tympanomastoidectomy and STSG (Dr. [name]). Recurrent fungal/bact OE.  . Varicose vein    left leg     Past Surgical History:  Procedure Laterality Date  . ABDOMINAL HYSTERECTOMY  03/2015   TAH.  Last pap 2016.  No hx of abnl paps.  Per GYN, no further pap smears indicated.  . ANTERIOR AND POSTERIOR REPAIR N/A 03/18/2014    Procedure: Cystocele repair with graft, Vault suspension, Rectocele repair;  Surgeon: [name], MD;  Location: [facility];  Service: Urology;  Laterality: N/A;  . CHOLECYSTECTOMY    . CHOLECYSTECTOMY, LAPAROSCOPIC    . COLON SURGERY    . COLONOSCOPY  04/2006; 05/26/16   2007 (Dr. [name]): Normal.  04/2016 (Dr. [name]) normal except diverticulosis and decreased anal sphincter tone.  No repeat colonoscopy is recommended due to age.  . cspine surgery     Dr. Wiliam Ke level ant cerv discectomy /fusion w/plating  . CYSTO N/A 03/18/2014   Procedure: CYSTO;  Surgeon: [name], MD;  Location: [facility];  Service: Urology;  Laterality: N/A;  . DEXA  07/30/2015; 06/2018   2017 and 2020 -->Osteopenia--repeat 2 yrs.  Marland Kitchen LYSIS OF ADHESION N/A 01/31/2018   Procedure: POSSIBLE LYSIS OF ADHESION;  Surgeon: Everitt Amber, MD;  Location: [facility];  Service: Gynecology;  Laterality: N/A;  . [place]     with prolasped bledder repair  . RESECTION OF COLON     BENIGN TUMOR  . RIGHT EAR SURGERY  08/23/2017   Dr. 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 right ear surgery  12/12/2018   Right tympanomastoidectomy, ossiculoplasty with partial prosthesis, split thickness skin graft from postauricular skin 1x1cm, and right tragal cartilage graft 1x1 cm [facility])  . right hemicolectomy for diverticulitis with abscess  1993  . ROBOTIC ASSISTED BILATERAL SALPINGO OOPHERECTOMY Bilateral 01/31/2018   All PATH benign.  Procedure: XI ROBOTIC ASSISTED BILATERAL SALPINGO OOPHORECTOMY;  Surgeon: Everitt Amber, MD;  Location: [facility];  Service: Gynecology;  Laterality: Bilateral;  . TRANSTHORACIC ECHOCARDIOGRAM  01/27/2020   EF 65-70%, NORMAL.  . TUBAL LIGATION      Outpatient Medications Prior to Visit  Medication Sig Dispense Refill  . ibuprofen (ADVIL) 200 MG tablet Take 200  mg by mouth every 6 (six) hours as needed.    Marland Kitchen acetaminophen (TYLENOL) 500 MG tablet Take 1 tablet (500 mg total) by mouth 3 (three) times daily. 30 tablet 0  . ALPRAZolam (XANAX) 0.5 MG tablet TAKE 1 TABLET BY MOUTH THREE TIMES A DAY AS NEEDED FOR ANXIETY 90 tablet 5  . aspirin EC 81 MG tablet Take 1 tablet (81 mg total) by mouth daily. Swallow whole. 30 tablet 11  . Biotin 5 MG TABS Take 5 mg by mouth daily.    . fluticasone (FLONASE) 50 MCG/ACT nasal spray Place 2 sprays into both nostrils at bedtime. 48 g 3  . HYDROcodone-acetaminophen (NORCO/VICODIN) 5-325 MG tablet 1/2-1 tab po q6h prn pain 15 tablet 0  . lisinopril (ZESTRIL)  5 MG tablet Take 1 tablet (5 mg total) by mouth daily. 90 tablet 3  . loratadine (CLARITIN) 10 MG tablet Take 10 mg by mouth every other day.     . meclizine (ANTIVERT) 12.5 MG tablet Take 1 tablet (12.5 mg total) by mouth 3 (three) times daily. (Patient taking differently: Take 12.5 mg by mouth. Takes at night) 30 tablet 0  . Multiple Minerals-Vitamins (CALCIUM-MAGNESIUM-ZINC-D3 PO) Take 1 tablet by mouth daily.     Marland Kitchen nystatin ointment (MYCOSTATIN) Apply 1 application topically 2 (two) times daily. Apply to affected area for up to 7 days. 30 g 0  . pantoprazole (PROTONIX) 40 MG tablet Take 1 tablet (40 mg total) by mouth at bedtime. 90 tablet 3  . polyethylene glycol (MIRALAX / GLYCOLAX) 17 g packet Take 17 g by mouth daily as needed.    . potassium chloride SA (KLOR-CON) 20 MEQ tablet Take 1 tablet (20 mEq total) by mouth daily. 90 tablet 3  . Sodium Fluoride (CLINPRO 5000) 1.1 % PSTE Clinpro 5000 1.1 % dental paste    . tiZANidine (ZANAFLEX) 2 MG tablet 1 tab po tid prn for muscle relaxation 90 tablet 3  . triamcinolone ointment (KENALOG) 0.1 % triamcinolone acetonide 0.1 % topical ointment    . verapamil (CALAN-SR) 180 MG CR tablet Take 1 tablet (180 mg total) by mouth daily. 90 tablet 3   No facility-administered medications prior to visit.    Allergies   Allergen Reactions  . Gabapentin Anxiety    Elevated heart rate and crazy dreams  . Prednisone Anxiety and Other (See Comments)    REACTION: funny feeling    ROS As per HPI  PE: Vitals with BMI 11/19/2020 11/06/2020 10/20/2020  Height 5\' 0"  5\' 0"  -  Weight 116 lbs 116 lbs 6 oz (No Data)  BMI 03.50 09.38 -  Systolic 182 993 716  Diastolic 74 68 70  Pulse 83 88 64     Gen: Alert, well appearing.  Patient is oriented to person, place, time, and situation. AFFECT: pleasant, lucid thought and speech. RCV:ELFY: no injection, icteris, swelling, or exudate.  EOMI, PERRLA. Mouth: lips without lesion/swelling.  Oral mucosa pink and moist. Oropharynx without erythema, exudate, or swelling.  CV: RRR (rate 85 by me), no m/r/g.   LUNGS: CTA bilat, nonlabored resps, good aeration in all lung fields. EXT: no clubbing or cyanosis.  no edema.  Neuro: CN 2-12 intact bilaterally, strength 5/5 in proximal and distal upper extremities and left lower extremity.  R leg strength not tested b/c has AFO R LL.  No sensory deficits.  No tremor.    Has pronator drift on R arm.   LABS:  Lab Results  Component Value Date   TSH 1.08 09/25/2019   Lab Results  Component Value Date   ESRSEDRATE 16 04/05/2011   Lab Results  Component Value Date   WBC 5.9 11/06/2020   HGB 12.6 11/06/2020   HCT 36.0 11/06/2020   MCV 88.7 11/06/2020   PLT 219 11/06/2020   Lab Results  Component Value Date   CREATININE 0.67 11/06/2020   BUN 11 11/06/2020   NA 130 (L) 11/06/2020   K 4.3 11/06/2020   CL 96 (L) 11/06/2020   CO2 25 11/06/2020   Lab Results  Component Value Date   ALT 29 06/25/2020   AST 30 06/25/2020   ALKPHOS 95 06/25/2020   BILITOT 1.0 06/25/2020   Lab Results  Component Value Date   CHOL 163 01/28/2020  Lab Results  Component Value Date   HDL 75 01/28/2020   Lab Results  Component Value Date   LDLCALC 81 01/28/2020   Lab Results  Component Value Date   TRIG 33 01/28/2020   Lab  Results  Component Value Date   CHOLHDL 2.2 01/28/2020   Lab Results  Component Value Date   HGBA1C 5.0 01/28/2020   IMPRESSION AND PLAN:  1) Malaise->resolved.  I very much doubt her mild chronically low/low normal Na has anything to do with this.  Some mild GI upset/dyspepsia she was having last visit is better with PPI dosing increased to bid, but we'll go ahead and drop this back to qd now.  2) HTN, well controlled per home measurements.  Continue lisinopril 5mg  qd and verapamil SR 180 qd. Lytes/cr stable 2 wks ago.  3) Anxiety and fibromyalgia: pt states xanax 0.5mg  tid prn very helpful for this. She's been on this long term and has tolerated well and used appropriately. Need to update CSC. She rarely uses vicodin for her pain. Takes ibuprofen avg of once a day.    4) Hx of hemorrhagic/hypertensive CVA, residual R leg weakness and flexion contracture-->gradually improving with PT, wearing R AFO, hopefully soon transitioning to hemi walker and eventually possibly just a cane!  An After Visit Summary was printed and given to the patient.  FOLLOW UP: Return in about 3 months (around 02/19/2021) for routine chronic illness f/u (ok to cancel 6/28 appt).  Signed:  Crissie Sickles, MD           11/19/2020

## 2020-11-24 NOTE — Progress Notes (Signed)
Follow up appointment scheduled 12/15/2020

## 2020-11-25 ENCOUNTER — Ambulatory Visit (HOSPITAL_COMMUNITY): Payer: Medicare Other | Admitting: Physical Therapy

## 2020-11-25 ENCOUNTER — Other Ambulatory Visit: Payer: Self-pay

## 2020-11-25 ENCOUNTER — Ambulatory Visit (HOSPITAL_COMMUNITY): Payer: Medicare Other | Attending: Physical Medicine & Rehabilitation

## 2020-11-25 ENCOUNTER — Encounter (HOSPITAL_COMMUNITY): Payer: Self-pay | Admitting: Physical Therapy

## 2020-11-25 ENCOUNTER — Encounter (HOSPITAL_COMMUNITY): Payer: Self-pay

## 2020-11-25 DIAGNOSIS — R278 Other lack of coordination: Secondary | ICD-10-CM | POA: Insufficient documentation

## 2020-11-25 DIAGNOSIS — M6281 Muscle weakness (generalized): Secondary | ICD-10-CM

## 2020-11-25 DIAGNOSIS — R29898 Other symptoms and signs involving the musculoskeletal system: Secondary | ICD-10-CM | POA: Diagnosis not present

## 2020-11-25 DIAGNOSIS — R2689 Other abnormalities of gait and mobility: Secondary | ICD-10-CM | POA: Insufficient documentation

## 2020-11-25 NOTE — Therapy (Signed)
[facility] [place], [place], [address] Phone: [phone]   Fax:  [phone]  Occupational Therapy Treatment  Patient Details  Name: Alison Cuevas MRN: [id number] Date of Birth: June 29, 1945 Referring Provider (OT): Alger Simons, MD   Encounter Date: 11/25/2020   OT End of Session - 11/25/20 1559    Visit Number 20    Number of Visits 25    Date for OT Re-Evaluation 12/09/20    Authorization Type double coverage Medicare 80/20% secondary: [employer] Covers what medicare doesn't.    Progress Note Due on Visit 27    OT Start Time 1345    OT Stop Time 1430    OT Time Calculation (min) 45 min    Activity Tolerance Patient tolerated treatment well    Behavior During Therapy --   Moments of somber and able to smile at times.          Past Medical History:  Diagnosis Date  . Allergic rhinitis    maple pollen  . Anxiety   . Arthritis   . Bowel obstruction ([place])   . Chronic low back pain    Dr. Trenton Gammon.  Has had back injection--bp elevated after.  . Chronic pain syndrome   . Chronic renal insufficiency, stage 2 (mild)    GFR 60-70  . DDD (degenerative disc disease), cervical    Hx of ACDF (Dr. [name]).  Followed by Dr. [name].  Also, Dr. [name] do left C7-T1 intralaminal epidural injection.  . Diverticulosis of colon   . DJD (degenerative joint disease)   . Gallstones   . GERD (gastroesophageal reflux disease)   . Herpes zoster 07/08/2014  . 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  Resid R arm and leg weakness + flexion contractures.  . Hypercholesteremia    mild; pt declined statin trial 05/2014--needs recheck lipid panel at first f/u visit in 2017  . Hypertension   . Osteopenia 06/2017   T score -1.4 FRAX 15% / 2%  . Ovarian cyst 2017   82019-robotic assisted bilatel SPO: all path benign.  . Perianal dermatitis    prn  cutivate  . Phlebitis   . Right hemiplegia ([place]) 01/2020   R arm and leg (hemorrhagic L cerebral hemisphere CVA)  . Right knee injury 2018   Patellofemoral crush injury--Dr. [name].  . Stroke ([place])   . Trochanteric bursitis of right hip    Recurrent (injection by Dr. [name] 05/26/15)  . Tympanic membrane rupture, right 12/01/2016   As of 08/2018 pt set for tympanomastoidectomy and STSG (Dr. [name]). Recurrent fungal/bact OE.  . Varicose vein    left leg     Past Surgical History:  Procedure Laterality Date  . ABDOMINAL HYSTERECTOMY  03/2015   TAH.  Last pap 2016.  No hx of abnl paps.  Per GYN, no further pap smears indicated.  . ANTERIOR AND POSTERIOR REPAIR N/A 03/18/2014   Procedure: Cystocele repair with graft, Vault suspension, Rectocele repair;  Surgeon: [name], MD;  [facility];  Service: Urology;  Laterality: N/A;  . CHOLECYSTECTOMY    . CHOLECYSTECTOMY, LAPAROSCOPIC    . COLON SURGERY    . COLONOSCOPY  04/2006; 05/26/16   2007 (Dr. [name]): Normal.  04/2016 (Dr. [name]) normal except diverticulosis and decreased anal sphincter tone.  No repeat colonoscopy is recommended due to age.  . cspine surgery     Dr. Wiliam Ke level ant  cerv discectomy /fusion w/plating  . CYSTO N/A 03/18/2014   Procedure: CYSTO;  Surgeon: [name], MD;  Location: [facility];  Service: Urology;  Laterality: N/A;  . DEXA  07/30/2015; 06/2018   2017 and 2020 -->Osteopenia--repeat 2 yrs.  Marland Kitchen LYSIS OF ADHESION N/A 01/31/2018   Procedure: POSSIBLE LYSIS OF ADHESION;  Surgeon: Everitt Amber, MD;  [facility]: [facility];  Service: Gynecology;  Laterality: N/A;  . [place]     with prolasped bledder repair  . RESECTION OF COLON     BENIGN TUMOR  . RIGHT EAR SURGERY  08/23/2017   Dr. 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 right ear surgery   12/12/2018   Right tympanomastoidectomy, ossiculoplasty with partial prosthesis, split thickness skin graft from postauricular skin 1x1cm, and right tragal cartilage graft 1x1 cm [facility])  . right hemicolectomy for diverticulitis with abscess  1993  . ROBOTIC ASSISTED BILATERAL SALPINGO OOPHERECTOMY Bilateral 01/31/2018   All PATH benign.  Procedure: XI ROBOTIC ASSISTED BILATERAL SALPINGO OOPHORECTOMY;  Surgeon: Everitt Amber, MD;  Location[facility];  Service: Gynecology;  Laterality: Bilateral;  . TRANSTHORACIC ECHOCARDIOGRAM  01/27/2020   EF 65-70%, NORMAL.  . TUBAL LIGATION      There were no vitals filed for this visit.   Subjective Assessment - 11/25/20 1421    Subjective  S: I just want my arm to get better.              [facility] OT Assessment - 11/25/20 1422      Assessment   Medical Diagnosis Right side weakness S/P CVA      Precautions   Precautions Fall                    OT Treatments/Exercises (OP) - 11/25/20 1422      Exercises   Exercises Shoulder;Hand      Neurological Re-education Exercises   Reciprocal Movements Started with single right arm punch 10X, then single left arm punch 10X, then reciprocal arm punch 10X seated    Other Exercises 1 Standing with RW, patient completed functional reaching and scapular stability activity with velcro number board. Used RUE to reach for numbers in order and remove if able to reach.    Other Grasp and Release Exercises  Focused on gross motor movement and increasing smoothness of motor movement using resistive cubes while placing all cubes from table top to container with increased speed then repeating task while transfering cubes from container to table.      Manual Therapy   Manual Therapy Taping;Other (comment)    Manual therapy comments completed separately from therapeutic exercises    Other Manual Therapy Kinsiotape applied to right shoulder to provide functional joint positioning and increased shoulder stability.  1 I strip and 2 Y strips.                  OT Education - 11/25/20 1559    Education Details Provided education on purchasing adhesive removal wipes which can be purchased online at [place] or [employer].    Person(s) Educated Patient    Methods Explanation    Comprehension Verbalized understanding            OT Short Term Goals - 10/19/20 1246      OT SHORT TERM GOAL #1   Title Pt will be educated and independent with  HEP in order to faciliate progress in therapy and work towards increasing her RUE strength and coordination while increasing her functional performance during ADL tasks.    Time 3    Period Weeks    Target Date 09/23/20             OT Long Term Goals - 11/16/20 1126      OT LONG TERM GOAL #1   Title Patient will increase right shoulder strength and elbow to 5/5 while demonstrating increased shoulder and scapular stability when reaching away from her body/base of support.    Time 6    Period Weeks    Status On-going      OT LONG TERM GOAL #2   Title Patient will increase right hand coordination by completing the 9 hole peg test in 30 seconds or less in order to roll hair with less difficulty.    Time 6    Period Weeks    Status On-going      OT LONG TERM GOAL #3   Title Patient will increase right hand grip strength by 10# and pinch strength by 5# in order to increase ability to grasp and manipulate items with less difficulty.    Time 6    Period Weeks      OT LONG TERM GOAL #4   Title patient will reach highest level of independence with increased confidence and safety awareness while completing all ADL tasks at modified independent level or better while utilizing adaptive equipment and/or compensatory strategies if needed.    Time 6    Period Weeks      OT LONG TERM GOAL #5   Title Patient will increase her gross motor coordination in her right UE by moving 38 blocks within a minute during the box and blocks test in order to increase her ability to  roll her hair with modified independence.    Time 4    Period Weeks    Status On-going    Target Date 12/09/20      OT LONG TERM GOAL #6   Title Patient will be able to demonstrate  increased LUE strength and endurance in order to complete simple housekeeping tasks such as cleaning/dusting and making her bed with supervision if needed.    Time 4    Period Weeks    Status On-going                 Plan - 11/25/20 1600    Clinical Impression Statement A: reapplied kinsiotape to right shoulder for shoulder pain and joint stability. Applied orginal strength kinsiotape this time as patient reports that the LT kinsiotape did not stay on. patient continues to demonstrate sadness and depression type behavior while discussing desired progress in therapy. Completed functional reaching tasks while incorporating shoulder and scapular stability in order to increase motor movement in the right UE. VC for form and technique were provided. Education on compensatory breathing techniques as she demonstrated increased rate of respirations when completing reaching task at [employer].    Body Structure / Function / Physical Skills ADL;UE functional use;FMC;ROM;Proprioception;Coordination;GMC;Strength    Plan P: Continue with motor control coordination tasks. Wall wash.    Consulted and Agree with Plan of Care Patient           Patient will benefit from skilled therapeutic intervention in order to improve the following deficits and impairments:   Body Structure / Function / Physical Skills: ADL,UE functional use,FMC,ROM,Proprioception,Coordination,GMC,Strength  Visit Diagnosis: Other symptoms and signs involving the musculoskeletal system  Other lack of coordination    Problem List Patient Active Problem List   Diagnosis Date Noted  . Thrombocytopenia, unspecified ([place]) 11/19/2020  . Pain in joint of right foot 04/15/2020  . Right spastic hemiparesis ([place]) 03/04/2020  . Chronic  otitis externa of right ear 02/20/2020  . Intraparenchymal hemorrhage of brain ([place])   . Delirium   . 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 Stroke (cerebrum) ([place]) 01/26/2020  . Conductive hearing loss of right ear with unrestricted hearing of left ear 05/28/2019  . Hyponatremia 08/31/2017  . Generalized weakness 08/31/2017  . Dehydration 08/31/2017  . UTI (urinary tract infection) 08/31/2017  . Tympanic membrane rupture, right 12/01/2016  . Right ovarian cyst 08/22/2016  . Fibromyalgia 06/03/2015  . Cystocele 03/18/2014  . 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 Diverticulosis of colon without hemorrhage 06/12/2013  . Hypertension   . Osteopenia   . Chronic low back pain 05/12/2011  . HYPERCHOLESTEROLEMIA 03/17/2008  . Anxiety state 09/17/2007  . GERD 09/17/2007  . DJD (degenerative joint disease) 09/17/2007    Ailene Ravel, OTR/L,CBIS  [phone]  11/25/2020, 4:26 PM  [facility] [address] [place], [place], [address] Phone: [phone]   Fax:  [phone]  Name: Alison Cuevas MRN: [id number] Date of Birth: Dec 18, 1945

## 2020-11-25 NOTE — Therapy (Signed)
[place], [place], [address] Phone: [phone]   Fax:  [phone]  Physical Therapy Treatment  Patient Details  Name: Alison Cuevas MRN: [id number] Date of Birth: [birth year] Referring Provider (PT): Alger Simons MD  PHYSICAL THERAPY DISCHARGE SUMMARY  Visits from Start of Care: 23  Current functional level related to goals / functional outcomes: See below    Remaining deficits: See below    Education / Equipment: See assessment  Plan: Patient agrees to discharge.  Patient goals were partially met. Patient is being discharged due to                                                     ?????        Max benefit reached    Encounter Date: 11/25/2020   PT End of Session - 11/25/20 1527    Visit Number 23    Number of Visits 24    Date for PT Re-Evaluation 11/25/20    Authorization Type Medicare A/ [place] NAP 2ndary    Progress Note Due on Visit 24    PT Start Time 1431    PT Stop Time 1520    PT Time Calculation (min) 49 min    Equipment Utilized During Treatment Gait belt    Activity Tolerance Patient tolerated treatment well    Behavior During Therapy WFL for tasks assessed/performed           Past Medical History:  Diagnosis Date  . Allergic rhinitis    maple pollen  . Anxiety   . Arthritis   . Bowel obstruction ([place])   . Chronic low back pain    Dr. Trenton Gammon.  Has had back injection--bp elevated after.  . Chronic pain syndrome   . Chronic renal insufficiency, stage 2 (mild)    GFR 60-70  . DDD (degenerative disc disease), cervical    Hx of ACDF (Dr. [name]).  Followed by Dr. [name].  Also, Dr. [name] do left C7-T1 intralaminal epidural injection.  . Diverticulosis of colon   . DJD (degenerative joint disease)   . Gallstones   . GERD (gastroesophageal reflux disease)   . Herpes zoster 07/08/2014  . 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  Resid R arm and leg weakness + flexion contractures.  . Hypercholesteremia    mild; pt declined statin trial 05/2014--needs recheck lipid panel at first f/u visit in 2017  . Hypertension   . Osteopenia 06/2017   T score -1.4 FRAX 15% / 2%  . Ovarian cyst 2017   82019-robotic assisted bilatel SPO: all path benign.  . Perianal dermatitis    prn cutivate  . Phlebitis   . Right hemiplegia ([place]) 01/2020   R arm and leg (hemorrhagic L cerebral hemisphere CVA)  . Right knee injury 2018   Patellofemoral crush injury--Dr. [name].  . Stroke ([place])   . Trochanteric bursitis of right hip    Recurrent (injection by Dr. [name] 05/26/15)  . Tympanic membrane rupture, right 12/01/2016   As of 08/2018 pt set for tympanomastoidectomy and STSG (Dr. [name]). Recurrent fungal/bact OE.  . Varicose vein    left leg     Past Surgical History:  Procedure Laterality Date  . ABDOMINAL HYSTERECTOMY  03/2015  TAH.  Last pap 2016.  No hx of abnl paps.  Per GYN, no further pap smears indicated.  . ANTERIOR AND POSTERIOR REPAIR N/A 03/18/2014   Procedure: Cystocele repair with graft, Vault suspension, Rectocele repair;  Surgeon: [name], MD;  Location: [facility];  Service: Urology;  Laterality: N/A;  . CHOLECYSTECTOMY    . CHOLECYSTECTOMY, LAPAROSCOPIC    . COLON SURGERY    . COLONOSCOPY  04/2006; 05/26/16   2007 (Dr. [name]): Normal.  04/2016 (Dr. [name]) normal except diverticulosis and decreased anal sphincter tone.  No repeat colonoscopy is recommended due to age.  . cspine surgery     Dr. Wiliam Ke level ant cerv discectomy /fusion w/plating  . CYSTO N/A 03/18/2014   Procedure: CYSTO;  Surgeon: [name], MD;  Location: [facility];  Service: Urology;  Laterality: N/A;  . DEXA  07/30/2015; 06/2018   2017 and 2020 -->Osteopenia--repeat 2 yrs.  Marland Kitchen LYSIS OF ADHESION N/A 01/31/2018   Procedure: POSSIBLE LYSIS OF ADHESION;  Surgeon: Everitt Amber,  MD;  Location: [facility];  Service: Gynecology;  Laterality: N/A;  . [place]     with prolasped bledder repair  . RESECTION OF COLON     BENIGN TUMOR  . RIGHT EAR SURGERY  08/23/2017   Dr. 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 right ear surgery  12/12/2018   Right tympanomastoidectomy, ossiculoplasty with partial prosthesis, split thickness skin graft from postauricular skin 1x1cm, and right tragal cartilage graft 1x1 cm [facility])  . right hemicolectomy for diverticulitis with abscess  1993  . ROBOTIC ASSISTED BILATERAL SALPINGO OOPHERECTOMY Bilateral 01/31/2018   All PATH benign.  Procedure: XI ROBOTIC ASSISTED BILATERAL SALPINGO OOPHORECTOMY;  Surgeon: Everitt Amber, MD;  Location: [facility];  Service: Gynecology;  Laterality: Bilateral;  . TRANSTHORACIC ECHOCARDIOGRAM  01/27/2020   EF 65-70%, NORMAL.  . TUBAL LIGATION      There were no vitals filed for this visit.   Subjective Assessment - 11/25/20 1526    Subjective Patient says she is ok "not like I wanted". Can tell she is better. Feels her "leg wont stay put". She says a taping technique was used last visit, she did not find this very helpful.    Pertinent History LT CVA 01/26/20    Limitations Standing;Walking;House hold activities    Patient Stated Goals Walk more/ better    Pain Onset More than a month ago              [facility] PT Assessment - 11/25/20 1829      Assessment   Medical Diagnosis Right side weakness S/P CVA    Referring Provider (PT) Alger Simons MD    Onset Date/Surgical Date 01/26/20    Prior Therapy yes      Precautions   Precautions Fall      Restrictions   Weight Bearing Restrictions No      Home Environment   Living Environment Private residence    Living Arrangements Other (Comment)   Is now widowed, but her children stay with her off and on for support    Available Help at Discharge Family      Prior Function   Level of Independence Independent with basic ADLs      Cognition   Overall Cognitive Status Within Functional Limits for tasks assessed      AROM   Overall  AROM Comments RT foot held in PF and INV position wihtout AFO due to increased muscle tone      Strength   Right Hip Flexion 4+/5   was 4   Left Hip Flexion 4+/5   was 4   Right Knee Extension 4+/5   was 4   Left Knee Extension 4+/5    Right Ankle Dorsiflexion 3-/5   increased tone/ plantar flexed and inverted   Left Ankle Dorsiflexion 4+/5      Ambulation/Gait   Ambulation/Gait Yes    Ambulation/Gait Assistance 6: Modified independent (Device/Increase time)    Ambulation Distance (Feet) 135 Feet    Assistive device Hemi-walker    Gait Pattern Decreased stride length;Decreased dorsiflexion - right    Ambulation Surface Level;Indoor    Gait Comments 2MWT      Static Standing Balance   Static Standing Balance -  Activities  Tandam Stance - Right Leg;Tandam Stance - Left Leg    Static Standing - Comment/# of Minutes 20 seconds Lt foot rear, 30 sec RT foot rear (in semi tandem)                                   PT Short Term Goals - 11/25/20 1837      PT SHORT TERM GOAL #1   Title Patient will be independent with initial HEP and self-management strategies to improve functional outcomes    Baseline Reports compliance and demos good return with activity    Time 4    Period Weeks    Status Achieved    Target Date 10/16/20      PT SHORT TERM GOAL #2   Title Patient will be able to ambulate at least 100 feet during 2MWT with LRAD to demonstrate improved ability to perform functional mobility and associated tasks.    Baseline 170 feet with quad cane    Time 4    Period Weeks    Status Achieved    Target Date 10/16/20             PT Long Term Goals - 11/25/20 1838      PT LONG TERM GOAL #1   Title Patient will be able to ambulate at  least 225 feet during 2MWT with LRAD to demonstrate improved ability to perform functional mobility and associated tasks.    Baseline Is able to ambulate beyond 225 feet safely using RW or hemiwalker, but with increased time. As of today 135 feet using hemi walker in 2MWT    Time 8    Period Weeks    Status Partially Met      PT LONG TERM GOAL #2   Title Patient will have equal to or > 4/5 MMT throughout RLE to improve ability to perform functional mobility, stair ambulation and ADLs.    Baseline See MMT (except RT ankle DF per spasticity)    Time 8    Period Weeks    Status Achieved      PT LONG TERM GOAL #3   Title Patient will be able to maintain semi tandem stance >30 seconds on BLEs to improve stability and reduce risk for falls    Baseline See balance    Time 8    Period Weeks    Status Partially Met                 Plan - 11/25/20 1840    Clinical  Impression Statement Patient with good functional ability overall at this point. There has not been much change in progress since last reassessment. At this point, patient's chief complaint seems to RT foot positioning which is due to increased muscle tone/ spasticity. She is able to safely ambulate with hemi walker. Patient has several questions regarding correct AD and AFO for her. Had long detailed discussion that she may choice whichever AD she feels best using in home between RW and hemi walker as she does well and shows good safety awareness with both.  She may use whichever is more comfortable using in her environments. We have sent referral and ordered JAS brace to address plantar flexed position of RT foot to help with tone. At this point patient has reached max benefit given limitations regarding RLE spasticity. Encouraged patient to follow up with referring provider for further options regarding issues with muscle tone and spasticity. She notes she has an appointment in July but she would like to meet sooner. Encouraged  patient to call office and attempt to schedule sooner appointment. Patient being DC today per max benefit of therapy reached. Answered all patient questions. Reviewed HEP. Patient encouraged to follow up with therapy service with any further questions or concerns.    Personal Factors and Comorbidities Age;Comorbidity 1    Comorbidities CVA    Examination-Activity Limitations Locomotion Level;Bed Mobility;Stand;Stairs;Transfers    Examination-Participation Restrictions Community Activity;Yard Work;Laundry;Driving;Cleaning    Stability/Clinical Decision Making Stable/Uncomplicated    Rehab Potential Good    PT Treatment/Interventions ADLs/Self Care Home Management;Aquatic Therapy;Iontophoresis 35m/ml Dexamethasone;DME Instruction;Balance training;Passive range of motion;Scar mobilization;Neuromuscular re-education;Gait training;Stair training;Functional mobility training;Patient/family education;Manual techniques;Dry needling;Energy conservation;Manual lymph drainage;Parrafin;Ultrasound;Therapeutic activities;Canalith Repostioning;Moist Heat;Traction;Biofeedback;Cryotherapy;Fluidtherapy;Electrical Stimulation;Contrast Bath;Therapeutic exercise;Orthotic Fit/Training;Compression bandaging;Splinting;Taping;Vasopneumatic Device;Joint Manipulations;Spinal Manipulations;Visual/perceptual remediation/compensation;Vestibular    PT Next Visit Plan DC to HEP    PT Home Exercise Plan Eval: seated march, heel/toe raise, LAQs; 09/17/20: STS, standing abduction and marching wiht UE support counter 3/30 staggered stance with support 4/20 bridge, clam 4/27 sidestepping    Consulted and Agree with Plan of Care Patient           Patient will benefit from skilled therapeutic intervention in order to improve the following deficits and impairments:  Pain,Improper body mechanics,Abnormal gait,Decreased balance,Decreased activity tolerance,Decreased range of motion,Decreased strength,Hypomobility,Decreased mobility,Impaired  sensation,Difficulty walking  Visit Diagnosis: Muscle weakness (generalized)  Other abnormalities of gait and mobility     Problem List Patient Active Problem List   Diagnosis Date Noted  . Thrombocytopenia, unspecified ([place] 11/19/2020  . Pain in joint of right foot 04/15/2020  . Right spastic hemiparesis ([place] 03/04/2020  . Chronic otitis externa of right ear 02/20/2020  . Intrap[place]renchymal hemorrhage of brain ([place]   . Delirium   . 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 Stroke (cerebrum) ([place] 01/26/2020  . Conductive hearing loss of right ear with unrestricted hearing of left ear 05/28/2019  . Hyponatremia 08/31/2017  . Generalized weakness 08/31/2017  . Dehydration 08/31/2017  . UTI (urinary tract infection) 08/31/2017  . Tympanic membrane rupture, right 12/01/2016  . Right ovarian cyst 08/22/2016  . Fibromyalgia 06/03/2015  . Cystocele 03/18/2014  . 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 Diverticulosis of colon without hemorrhage 06/12/2013  . Hypertension   . Osteopenia   . Chronic low back pain 05/12/2011  . HYPERCHOLESTEROLEMIA 03/17/2008  . Anxiety state 09/17/2007  . GERD 09/17/2007  . DJD (degenerative joint disease) 09/17/2007    6:45 PM, 11/25/20 CJosue HectorPT DPT  Physical Therapist with [facility]  [phone]   [facility] [address] [place], [place], [address] Phone: [phone]   Fax:  [phone]  Name: Alison Cuevas MRN: [id number] Date of Birth: 05/24/1946

## 2020-11-26 ENCOUNTER — Telehealth: Payer: Self-pay | Admitting: Physical Medicine & Rehabilitation

## 2020-11-26 NOTE — Telephone Encounter (Signed)
Patient would was told by Physical therapist need orders for they have sent an email/evaluation yesterday. Need orders for more?  OT is going to fit for brace with foot?  Do you need to see her for follow up prior to scheduled apt for Botox. Marland Kitchen

## 2020-11-27 NOTE — Telephone Encounter (Signed)
Don't need to see again before botox.  i'm not sure what orders your asking for though.

## 2020-11-27 NOTE — Telephone Encounter (Signed)
I just read PT note from 6/1 where the therapist said she had currently reached maximal gains from therapy. She was discharged from therapy and is to follow up with me.

## 2020-12-01 NOTE — Telephone Encounter (Addendum)
Left message with her son's VM per DPR.

## 2020-12-02 ENCOUNTER — Ambulatory Visit (HOSPITAL_COMMUNITY): Payer: Medicare Other | Admitting: Physical Therapy

## 2020-12-02 ENCOUNTER — Ambulatory Visit (HOSPITAL_COMMUNITY): Payer: Medicare Other

## 2020-12-03 ENCOUNTER — Ambulatory Visit (HOSPITAL_COMMUNITY): Payer: Medicare Other

## 2020-12-03 ENCOUNTER — Other Ambulatory Visit: Payer: Self-pay

## 2020-12-03 DIAGNOSIS — R29898 Other symptoms and signs involving the musculoskeletal system: Secondary | ICD-10-CM | POA: Diagnosis not present

## 2020-12-03 DIAGNOSIS — R278 Other lack of coordination: Secondary | ICD-10-CM

## 2020-12-03 NOTE — Therapy (Signed)
[place], [place], [address] Phone: [phone]   Fax:  [phone]  Occupational Therapy Treatment  Patient Details  Name: Alison Cuevas MRN: [id number] Date of Birth: Dec 06, 1945 Referring Provider (OT): Alger Simons, MD   Encounter Date: 12/03/2020   OT End of Session - 12/03/20 1147     Visit Number 21    Number of Visits 25    Date for OT Re-Evaluation 12/09/20    Authorization Type double coverage Medicare 80/20% secondary: [employer] Covers what medicare doesn't.    Progress Note Due on Visit 37    OT Start Time 0950   Pt arrived late   OT Stop Time 1030    OT Time Calculation (min) 40 min    Activity Tolerance Patient tolerated treatment well    Behavior During Therapy [facility] for tasks assessed/performed   Moments of somber and able to smile at times.            Past Medical History:  Diagnosis Date   Allergic rhinitis    maple pollen   Anxiety    Arthritis    Bowel obstruction (HCC)    Chronic low back pain    Dr. Trenton Gammon.  Has had back injection--bp elevated after.   Chronic pain syndrome    Chronic renal insufficiency, stage 2 (mild)    GFR 60-70   DDD (degenerative disc disease), cervical    Hx of ACDF (Dr. [name]).  Followed by Dr. [name].  Also, Dr. [name] do left C7-T1 intralaminal epidural injection.   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  Resid R arm and leg weakness + flexion contractures.   Hypercholesteremia    mild; pt declined statin trial 05/2014--needs recheck lipid panel at first f/u visit in 2017   Hypertension    Osteopenia 06/2017   T score -1.4 FRAX 15% / 2%   Ovarian cyst 2017   82019-robotic assisted bilatel SPO: all path benign.    Perianal dermatitis    prn cutivate   Phlebitis    Right hemiplegia ([place]) 01/2020   R arm and leg (hemorrhagic L cerebral hemisphere CVA)   Right knee injury 2018   Patellofemoral crush injury--Dr. [name].   Stroke [facility])    Trochanteric bursitis of right hip    Recurrent (injection by Dr. 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 [name]). Recurrent fungal/bact OE.   Varicose vein    left leg     Past Surgical History:  Procedure Laterality Date   ABDOMINAL HYSTERECTOMY  03/2015   TAH.  Last pap 2016.  No hx of abnl paps.  Per GYN, no further pap smears indicated.   ANTERIOR AND POSTERIOR REPAIR N/A 03/18/2014   Procedure: Cystocele repair with graft, Vault suspension, Rectocele repair;  Surgeon: [name], MD;  [facility]: [facility];  Service: Urology;  Laterality: N/A;   CHOLECYSTECTOMY     CHOLECYSTECTOMY, LAPAROSCOPIC     COLON SURGERY     COLONOSCOPY  04/2006; 05/26/16   2007 (Dr. [name]): Normal.  04/2016 (Dr. [name]) normal except diverticulosis and decreased anal sphincter tone.  No repeat colonoscopy is recommended due to age.  cspine surgery     Dr. Wiliam Ke level ant cerv discectomy [name] w/plating   CYSTO N/A 03/18/2014   Procedure: CYSTO;  Surgeon: [name], MD;  [facility];  Service: Urology;  Laterality: N/A;   DEXA  07/30/2015; 06/2018   2017 and 2020 -->Osteopenia--repeat 2 yrs.   LYSIS OF ADHESION N/A 01/31/2018   Procedure: POSSIBLE LYSIS OF ADHESION;  Surgeon: Everitt Amber, MD;  [facility];  Service: Gynecology;  Laterality: N/A;   [place]     with prolasped bledder repair   RESECTION OF COLON     BENIGN TUMOR   RIGHT EAR SURGERY  08/23/2017   Dr. 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  Procedure: XI ROBOTIC ASSISTED BILATERAL SALPINGO OOPHORECTOMY;  Surgeon: Everitt Amber, MD;  [facility];  Service: Gynecology;  Laterality: Bilateral;   TRANSTHORACIC ECHOCARDIOGRAM  01/27/2020   EF 65-70%, NORMAL.   TUBAL LIGATION      There were no vitals filed for this visit.   Subjective Assessment - 12/03/20 1059     Subjective  S: I really hope you're seeing progress.    Currently in Pain? Yes   No number provided. patient reports continuous tighness in right arm and forearm.               [facility] OT Assessment - 12/03/20 1145       Assessment   Medical Diagnosis Right side weakness S/P CVA      Precautions   Precautions Fall                      OT Treatments/Exercises (OP) - 12/03/20 1101       Exercises   Exercises Shoulder      Neurological Re-education Exercises   Scapular Stabilization Right;Left;Bilateral;Standing    Other Information Scapular and shoulder stability exercises completed both seated and standing. Squigz placed on door using both left and then right UE. Crossing midline while weightshifting laterally was completed; Contact guard assist provided. Orange ball used with both hands rolling it from left to right around and under 4 squigz placed in diamond shape. Completed standing and sitting.                    OT Education - 12/03/20 1145     Education Details Discussed in general how insurance approves therapy to continue when progress is shown and there is potential for improvement to be made. Discussed standing stance with walker when completing standing activities to help with  balance. Encouraged completing wall wash or washing windows at home with the RUE. Encouraged activities that require use of bilateral UE's.    Person(s) Educated Patient    Methods Explanation    Comprehension Verbalized understanding              OT Short Term Goals - 10/19/20 1246       OT SHORT TERM GOAL #1   Title Pt will be educated and independent with HEP in order to faciliate progress in  therapy and work towards increasing her RUE strength and coordination while increasing her functional performance during ADL tasks.    Time 3    Period Weeks    Target Date 09/23/20               OT Long Term Goals - 11/16/20 1126       OT LONG TERM GOAL #1   Title Patient will increase right shoulder strength and elbow to 5/5 while demonstrating increased shoulder and scapular stability when reaching away from her body/base of support.    Time 6    Period Weeks    Status On-going      OT LONG TERM GOAL #2   Title Patient will increase right hand coordination by completing the 9 hole peg test in 30 seconds or less in order to roll hair with less difficulty.    Time 6    Period Weeks    Status On-going      OT LONG TERM GOAL #3   Title Patient will increase right hand grip strength by 10# and pinch strength by 5# in order to increase ability to grasp and manipulate items with less difficulty.    Time 6    Period Weeks      OT LONG TERM GOAL #4   Title patient will reach highest level of independence with increased confidence and safety awareness while completing all ADL tasks at modified independent level or better while utilizing adaptive equipment and/or compensatory strategies if needed.    Time 6    Period Weeks      OT LONG TERM GOAL #5   Title Patient will increase her gross motor coordination in her right UE by moving 38 blocks within a minute during the box and blocks test in order to increase her ability to roll her hair with modified independence.    Time 4     Period Weeks    Status On-going    Target Date 12/09/20      OT LONG TERM GOAL #6   Title Patient will be able to demonstrate  increased LUE strength and endurance in order to complete simple housekeeping tasks such as cleaning/dusting and making her bed with supervision if needed.    Time 4    Period Weeks    Status On-going                   Plan - 12/03/20 1147     Clinical Impression Statement A: removed kinsiotape from right shoulder using removal wipes. Lotion applied after. Focused on shoulder and scapular stability activities while seated and standing. Patient required hands on facilitation at her hips to complete appropriate right and left weightshifting. VC provided for proper standing stance to maintain balance effectively. No loss of balance during activities. Patient voices fear of falling at home due to her balance. Muscle fatigue noted towards end of session with patient demonstrating max difficulty rolling orange ball to the left and around blue squigz.    Body Structure / Function / Physical Skills ADL;UE functional use;FMC;ROM;Proprioception;Coordination;GMC;Strength    Plan P: Continue with motor control coordination tasks. Re-tape right shoulder.    Consulted and Agree with Plan of Care Patient             Patient will benefit from skilled therapeutic intervention in order to improve the following deficits and impairments:   Body Structure / Function / Physical Skills: ADL, UE functional use, FMC, ROM, Proprioception, Coordination, GMC, Strength  Visit Diagnosis: Other symptoms and signs involving the musculoskeletal system  Other lack of coordination    Problem List Patient Active Problem List   Diagnosis Date Noted   Thrombocytopenia, unspecified ([place]) 11/19/2020   Pain in joint of right foot 04/15/2020   Right spastic hemiparesis ([place]) 03/04/2020   Chronic otitis externa of right ear 02/20/2020   Intraparenchymal hemorrhage of brain  ([place])    Delirium    ICH (intracerebral hemorrhage) ([place]) - L frontoparietal, hypertensive 01/26/2020   Stroke (cerebrum) ([place]) 01/26/2020   Conductive hearing loss of right ear with unrestricted hearing of left ear 05/28/2019   Hyponatremia 08/31/2017   Generalized weakness 08/31/2017   Dehydration 08/31/2017   UTI (urinary tract infection) 08/31/2017   Tympanic membrane rupture, right 12/01/2016   Right ovarian cyst 08/22/2016   Fibromyalgia 06/03/2015   Cystocele 03/18/2014   Tick-borne disease 12/25/2013   Diverticulosis of colon without hemorrhage 06/12/2013   Hypertension    Osteopenia    Chronic low back pain 05/12/2011   HYPERCHOLESTEROLEMIA 03/17/2008   Anxiety state 09/17/2007   GERD 09/17/2007   DJD (degenerative joint disease) 09/17/2007    Ailene Ravel, OTR/L,CBIS  [phone]   12/03/2020, 11:54 AM  [place], [place], [address] Phone: [phone]   Fax:  [phone]  Name: Lailanie Hasley MRN: [id number] Date of Birth: 26-Oct-1945

## 2020-12-04 ENCOUNTER — Ambulatory Visit (HOSPITAL_COMMUNITY): Payer: Medicare Other

## 2020-12-04 ENCOUNTER — Encounter (HOSPITAL_COMMUNITY): Payer: Self-pay

## 2020-12-04 DIAGNOSIS — R29898 Other symptoms and signs involving the musculoskeletal system: Secondary | ICD-10-CM

## 2020-12-04 DIAGNOSIS — R278 Other lack of coordination: Secondary | ICD-10-CM

## 2020-12-07 ENCOUNTER — Ambulatory Visit (HOSPITAL_COMMUNITY): Payer: Medicare Other

## 2020-12-07 ENCOUNTER — Other Ambulatory Visit: Payer: Self-pay

## 2020-12-07 ENCOUNTER — Ambulatory Visit (HOSPITAL_COMMUNITY): Payer: Medicare Other | Admitting: Physical Therapy

## 2020-12-07 ENCOUNTER — Encounter (HOSPITAL_COMMUNITY): Payer: Self-pay

## 2020-12-07 DIAGNOSIS — R29898 Other symptoms and signs involving the musculoskeletal system: Secondary | ICD-10-CM | POA: Diagnosis not present

## 2020-12-07 DIAGNOSIS — R278 Other lack of coordination: Secondary | ICD-10-CM

## 2020-12-07 NOTE — Therapy (Signed)
[place], [place], [address] Phone: [phone]   Fax:  [phone]  Occupational Therapy Treatment  Patient Details  Name: Alison Cuevas MRN: [id number] Date of Birth: 01-19-1946 Referring Provider (OT): Alger Simons, MD   Encounter Date: 12/07/2020   OT End of Session - 12/07/20 1710     Visit Number 23    Number of Visits 25    Date for OT Re-Evaluation 12/09/20    Authorization Type double coverage Medicare 80/20% secondary: [employer] Covers what medicare doesn't.    Progress Note Due on Visit 27    OT Start Time 1030    OT Stop Time 1116    OT Time Calculation (min) 46 min    Activity Tolerance Patient tolerated treatment well    Behavior During Therapy [facility] for tasks assessed/performed   Moments of somber and able to smile at times.            Past Medical History:  Diagnosis Date   Allergic rhinitis    maple pollen   Anxiety    Arthritis    Bowel obstruction (HCC)    Chronic low back pain    Dr. Trenton Gammon.  Has had back injection--bp elevated after.   Chronic pain syndrome    Chronic renal insufficiency, stage 2 (mild)    GFR 60-70   DDD (degenerative disc disease), cervical    Hx of ACDF (Dr. [name]).  Followed by Dr. [name].  Also, Dr. [name] do left C7-T1 intralaminal epidural injection.   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  Resid R arm and leg weakness + flexion contractures.   Hypercholesteremia    mild; pt declined statin trial 05/2014--needs recheck lipid panel at first f/u visit in 2017   Hypertension    Osteopenia 06/2017   T score -1.4 FRAX 15% / 2%   Ovarian cyst 2017   82019-robotic assisted bilatel SPO: all path benign.   Perianal  dermatitis    prn cutivate   Phlebitis    Right hemiplegia ([place]) 01/2020   R arm and leg (hemorrhagic L cerebral hemisphere CVA)   Right knee injury 2018   Patellofemoral crush injury--Dr. [name].   Stroke [facility])    Trochanteric bursitis of right hip    Recurrent (injection by Dr. 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 [name]). Recurrent fungal/bact OE.   Varicose vein    left leg     Past Surgical History:  Procedure Laterality Date   ABDOMINAL HYSTERECTOMY  03/2015   TAH.  Last pap 2016.  No hx of abnl paps.  Per GYN, no further pap smears indicated.   ANTERIOR AND POSTERIOR REPAIR N/A 03/18/2014   Procedure: Cystocele repair with graft, Vault suspension, Rectocele repair;  Surgeon: [name], MD;  [facility];  Service: Urology;  Laterality: N/A;   CHOLECYSTECTOMY     CHOLECYSTECTOMY, LAPAROSCOPIC     COLON SURGERY     COLONOSCOPY  04/2006; 05/26/16   2007 (Dr. [name]): Normal.  04/2016 (Dr. [name]) normal except diverticulosis and decreased anal sphincter tone.  No repeat colonoscopy is recommended due to age.   cspine surgery  Dr. Wiliam Ke level ant cerv discectomy [name] w/plating   CYSTO N/A 03/18/2014   Procedure: CYSTO;  Surgeon: [name], MD;  [facility];  Service: Urology;  Laterality: N/A;   DEXA  07/30/2015; 06/2018   2017 and 2020 -->Osteopenia--repeat 2 yrs.   LYSIS OF ADHESION N/A 01/31/2018   Procedure: POSSIBLE LYSIS OF ADHESION;  Surgeon: Everitt Amber, MD;  [facility];  Service: Gynecology;  Laterality: N/A;   [place]     with prolasped bledder repair   RESECTION OF COLON     BENIGN TUMOR   RIGHT EAR SURGERY  08/23/2017   Dr. 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  Procedure: XI ROBOTIC ASSISTED BILATERAL SALPINGO OOPHORECTOMY;  Surgeon: Everitt Amber, MD;  [facility];  Service: Gynecology;  Laterality: Bilateral;   TRANSTHORACIC ECHOCARDIOGRAM  01/27/2020   EF 65-70%, NORMAL.   TUBAL LIGATION      There were no vitals filed for this visit.   Subjective Assessment - 12/07/20 1113     Subjective  S: I have a hard time lifting the mattress to put the fitted sheet on.    Currently in Pain? No/denies                [facility] OT Assessment - 12/07/20 1114       Assessment   Medical Diagnosis Right side weakness S/P CVA      Precautions   Precautions Fall                      OT Treatments/Exercises (OP) - 12/07/20 1114       Exercises   Exercises Shoulder      Shoulder Exercises: ROM/Strengthening   UBE (Upper Arm Bike) level 1 2' reverse 2' forward pace: 2.5-3.0      Functional Reaching Activities   Mid Level completed functional reaching activity while focusing on functional mobility of the right arm, scapular stability, and weightshfting from left and right side of body using velcro number board while standing with walker.      Manual Therapy   Manual Therapy Taping    Manual therapy comments completed separately from therapeutic exercises    Other Manual Therapy Kinsiotape applied to right shoulder to provide functional joint positioning and increased shoulder stability. 1 I strip and 2 Y strips.                    OT Education - 12/07/20 1712     Education Details Discussed ADL tasks and what continues to be difficult at home. Pt reports more difficulty with tasks such  as lifting the mattress to slip the fitted sheet under. Discussed that patient has the ability to complete basic ADL tasks or basic meal prep tasks, she just needs to modify or change how she's doing them due to her mobility (either in the wheelchair or using a walker). Encouraged her to speak to her MD regarding her depression since her husband's passing although she is not interested in seeking anymore help  at this time.    Person(s) Educated Patient    Methods Explanation    Comprehension Verbalized understanding              OT Short Term Goals - 10/19/20 1246       OT SHORT TERM GOAL #1   Title Pt will be educated and independent with HEP in order to faciliate progress in therapy and work towards increasing her RUE strength and coordination while increasing her functional performance during ADL tasks.    Time 3    Period Weeks    Target Date 09/23/20               OT Long Term Goals - 11/16/20 1126       OT LONG TERM GOAL #1   Title Patient will increase right shoulder strength and elbow to 5/5 while demonstrating increased shoulder and scapular stability when reaching away from her body/base of support.    Time 6    Period Weeks    Status On-going      OT LONG TERM GOAL #2   Title Patient will increase right hand coordination by completing the 9 hole peg test in 30 seconds or less in order to roll hair with less difficulty.    Time 6    Period Weeks    Status On-going      OT LONG TERM GOAL #3   Title Patient will increase right hand grip strength by 10# and pinch strength by 5# in order to increase ability to grasp and manipulate items with less difficulty.    Time 6    Period Weeks      OT LONG TERM GOAL #4   Title patient will reach highest level of independence with increased confidence and safety awareness while completing all ADL tasks at modified independent level or better while utilizing adaptive equipment and/or compensatory strategies if needed.     Time 6    Period Weeks      OT LONG TERM GOAL #5   Title Patient will increase her gross motor coordination in her right UE by moving 38 blocks within a minute during the box and blocks test in order to increase her ability to roll her hair with modified independence.    Time 4    Period Weeks    Status On-going    Target Date 12/09/20      OT LONG TERM GOAL #6   Title Patient will be able to demonstrate  increased LUE strength and endurance in order to complete simple housekeeping tasks such as cleaning/dusting and making her bed with supervision if needed.    Time 4    Period Weeks    Status On-going                   Plan - 12/07/20 1716     Clinical Impression Statement A: kinsiotape was reapplied to right shoulder to help with joint stability and pain management. Spent a good portion of session discussing ADL performance and difficulty completing certain tasks at home. Pt seems to be more focused on the difficulty level of completing task versus that she is able to complete it and then can work on her performance of task. Emotionally, patient has appeared more depressed since the passing of her husband. This change mentally is seen in each therapy session which has affected her outlook on her performance which I believe has also effected her progression. Her RUE continues to be display  ataxic movement when she is attempting to stabilize her arm or complete a gross motor task.    Body Structure / Function / Physical Skills ADL;UE functional use;FMC;ROM;Proprioception;Coordination;GMC;Strength    Plan P: reassessment. Complete re-cert only if needed. review goals. review HEP.    Consulted and Agree with Plan of Care Patient             Patient will benefit from skilled therapeutic intervention in order to improve the following deficits and impairments:   Body Structure / Function / Physical Skills: ADL, UE functional use, FMC, ROM, Proprioception, Coordination, GMC,  Strength       Visit Diagnosis: Other symptoms and signs involving the musculoskeletal system  Other lack of coordination    Problem List Patient Active Problem List   Diagnosis Date Noted   Thrombocytopenia, unspecified ([place]) 11/19/2020   Pain in joint of right foot 04/15/2020   Right spastic hemiparesis ([place]) 03/04/2020   Chronic otitis externa of right ear 02/20/2020   Intraparenchymal hemorrhage of brain ([place])    Delirium    ICH (intracerebral hemorrhage) ([place]) - L frontoparietal, hypertensive 01/26/2020   Stroke (cerebrum) ([place]) 01/26/2020   Conductive hearing loss of right ear with unrestricted hearing of left ear 05/28/2019   Hyponatremia 08/31/2017   Generalized weakness 08/31/2017   Dehydration 08/31/2017   UTI (urinary tract infection) 08/31/2017   Tympanic membrane rupture, right 12/01/2016   Right ovarian cyst 08/22/2016   Fibromyalgia 06/03/2015   Cystocele 03/18/2014   Tick-borne disease 12/25/2013   Diverticulosis of colon without hemorrhage 06/12/2013   Hypertension    Osteopenia    Chronic low back pain 05/12/2011   HYPERCHOLESTEROLEMIA 03/17/2008   Anxiety state 09/17/2007   GERD 09/17/2007   DJD (degenerative joint disease) 09/17/2007    Ailene Ravel, OTR/L,CBIS  [phone]   12/07/2020, 5:28 PM  [place], [place], [address] Phone: [phone]   Fax:  [phone]  Name: Alison Cuevas MRN: [id number] Date of Birth: [birth year]

## 2020-12-07 NOTE — Therapy (Signed)
[place], [place], [address] Phone: [phone]   Fax:  [phone]  Occupational Therapy Treatment  Patient Details  Name: Alison Cuevas MRN: [id number] Date of Birth: [birth year] Referring Provider (OT): Alger Simons, MD   Encounter Date: 12/04/2020   OT End of Session - 12/07/20 0927     Visit Number 22    Number of Visits 25    Date for OT Re-Evaluation 12/09/20    Authorization Type double coverage Medicare 80/20% secondary: [employer] Covers what medicare doesn't.    Progress Note Due on Visit 27    OT Start Time 1117    OT Stop Time 1200    OT Time Calculation (min) 43 min    Activity Tolerance Patient tolerated treatment well    Behavior During Therapy [facility] for tasks assessed/performed   Moments of somber and able to smile at times.            Past Medical History:  Diagnosis Date   Allergic rhinitis    maple pollen   Anxiety    Arthritis    Bowel obstruction (HCC)    Chronic low back pain    Dr. Trenton Gammon.  Has had back injection--bp elevated after.   Chronic pain syndrome    Chronic renal insufficiency, stage 2 (mild)    GFR 60-70   DDD (degenerative disc disease), cervical    Hx of ACDF (Dr. [name]).  Followed by Dr. [name].  Also, Dr. [name] do left C7-T1 intralaminal epidural injection.   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  Resid R arm and leg weakness + flexion contractures.   Hypercholesteremia    mild; pt declined statin trial 05/2014--needs recheck lipid panel at first f/u visit in 2017   Hypertension    Osteopenia 06/2017   T score -1.4 FRAX 15% / 2%   Ovarian cyst 2017   82019-robotic assisted bilatel SPO: all path benign.   Perianal  dermatitis    prn cutivate   Phlebitis    Right hemiplegia ([place]) 01/2020   R arm and leg (hemorrhagic L cerebral hemisphere CVA)   Right knee injury 2018   Patellofemoral crush injury--Dr. [name].   Stroke [facility])    Trochanteric bursitis of right hip    Recurrent (injection by Dr. 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 [name]). Recurrent fungal/bact OE.   Varicose vein    left leg     Past Surgical History:  Procedure Laterality Date   ABDOMINAL HYSTERECTOMY  03/2015   TAH.  Last pap 2016.  No hx of abnl paps.  Per GYN, no further pap smears indicated.   ANTERIOR AND POSTERIOR REPAIR N/A 03/18/2014   Procedure: Cystocele repair with graft, Vault suspension, Rectocele repair;  Surgeon: [name], MD;  Location: [facility];  Service: Urology;  Laterality: N/A;   CHOLECYSTECTOMY     CHOLECYSTECTOMY, LAPAROSCOPIC     COLON SURGERY     COLONOSCOPY  04/2006; 05/26/16   2007 (Dr. [name]): Normal.  04/2016 (Dr. [name]) normal except diverticulosis and decreased anal sphincter tone.  No repeat colonoscopy is recommended due to age.   cspine surgery  Dr. Wiliam Ke level ant cerv discectomy [name] w/plating   CYSTO N/A 03/18/2014   Procedure: CYSTO;  Surgeon: [name], MD;  [facility];  Service: Urology;  Laterality: N/A;   DEXA  07/30/2015; 06/2018   2017 and 2020 -->Osteopenia--repeat 2 yrs.   LYSIS OF ADHESION N/A 01/31/2018   Procedure: POSSIBLE LYSIS OF ADHESION;  Surgeon: Everitt Amber, MD;  [facility];  Service: Gynecology;  Laterality: N/A;   [place]     with prolasped bledder repair   RESECTION OF COLON     BENIGN TUMOR   RIGHT EAR SURGERY  08/23/2017   Dr. 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  Procedure: XI ROBOTIC ASSISTED BILATERAL SALPINGO OOPHORECTOMY;  Surgeon: Everitt Amber, MD;  [facility];  Service: Gynecology;  Laterality: Bilateral;   TRANSTHORACIC ECHOCARDIOGRAM  01/27/2020   EF 65-70%, NORMAL.   TUBAL LIGATION      There were no vitals filed for this visit.      12/04/20 1127  Assessment  Medical Diagnosis Right side weakness S/P CVA  Precautions  Precautions Fall      12/04/20 1127  Exercises  Exercises Shoulder  Shoulder Exercises: ROM/Strengthening  UBE (Upper Arm Bike) level 1 2.5' forward 2.5' backward pace: 3.0  Neurological Re-education Exercises  Scapular Stabilization Right;Left;Bilateral  Other Information Scapular and shoulder stability exercises completed both seated and standing. Squigz placed on door using both left and then right UE. Crossing midline while weightshifting laterally was completed; Contact guard assist provided.                       OT Short Term Goals - 10/19/20 1246       OT SHORT TERM GOAL #1   Title Pt will be educated and independent with HEP in order to faciliate progress in therapy and work towards increasing her RUE strength and coordination while increasing her functional performance during ADL tasks.    Time 3    Period Weeks    Target Date 09/23/20               OT Long Term Goals - 11/16/20 1126       OT LONG TERM GOAL #1   Title Patient will increase right shoulder strength and elbow to 5/5 while demonstrating increased shoulder and scapular stability when reaching away from her body/base of support.    Time 6    Period Weeks    Status On-going       OT LONG TERM GOAL #2   Title Patient will increase right hand coordination by completing the 9 hole peg test in 30 seconds or less in order to roll hair with less difficulty.    Time 6    Period Weeks    Status On-going      OT LONG TERM GOAL #3   Title Patient will increase right hand grip strength by 10# and pinch strength by 5# in order to increase ability to grasp and manipulate items with less difficulty.  Time 6    Period Weeks      OT LONG TERM GOAL #4   Title patient will reach highest level of independence with increased confidence and safety awareness while completing all ADL tasks at modified independent level or better while utilizing adaptive equipment and/or compensatory strategies if needed.    Time 6    Period Weeks      OT LONG TERM GOAL #5   Title Patient will increase her gross motor coordination in her right UE by moving 38 blocks within a minute during the box and blocks test in order to increase her ability to roll her hair with modified independence.    Time 4    Period Weeks    Status On-going    Target Date 12/09/20      OT LONG TERM GOAL #6   Title Patient will be able to demonstrate  increased LUE strength and endurance in order to complete simple housekeeping tasks such as cleaning/dusting and making her bed with supervision if needed.    Time 4    Period Weeks    Status On-going                   Plan - 12/07/20 2703     Clinical Impression Statement A: Continued to focus session on shoulder and scapular stability while incorporating standing weightshifting onto right side as well as requiring the right side to provide balance during weightshift. Pt reports that she is able to roll 2 of her hair rollers out of 7 before her sister is needed to help. VC for form and technique were provided during session.    Body Structure / Function / Physical Skills ADL;UE functional use;FMC;ROM;Proprioception;Coordination;GMC;Strength    Plan P:  Continue with motor control coordination tasks. Re-tape right shoulder.    Consulted and Agree with Plan of Care Patient             Patient will benefit from skilled therapeutic intervention in order to improve the following deficits and impairments:   Body Structure / Function / Physical Skills: ADL, UE functional use, FMC, ROM, Proprioception, Coordination, GMC, Strength       Visit Diagnosis: Other symptoms and signs involving the musculoskeletal system  Other lack of coordination    Problem List Patient Active Problem List   Diagnosis Date Noted   Thrombocytopenia, unspecified ([place]) 11/19/2020   Pain in joint of right foot 04/15/2020   Right spastic hemiparesis ([place]) 03/04/2020   Chronic otitis externa of right ear 02/20/2020   Intraparenchymal hemorrhage of brain ([place])    Delirium    ICH (intracerebral hemorrhage) ([place]) - L frontoparietal, hypertensive 01/26/2020   Stroke (cerebrum) ([place]) 01/26/2020   Conductive hearing loss of right ear with unrestricted hearing of left ear 05/28/2019   Hyponatremia 08/31/2017   Generalized weakness 08/31/2017   Dehydration 08/31/2017   UTI (urinary tract infection) 08/31/2017   Tympanic membrane rupture, right 12/01/2016   Right ovarian cyst 08/22/2016   Fibromyalgia 06/03/2015   Cystocele 03/18/2014   Tick-borne disease 12/25/2013   Diverticulosis of colon without hemorrhage 06/12/2013   Hypertension    Osteopenia    Chronic low back pain 05/12/2011   HYPERCHOLESTEROLEMIA 03/17/2008   Anxiety state 09/17/2007   GERD 09/17/2007   DJD (degenerative joint disease) 09/17/2007    Alison Cuevas, Alison Cuevas,Alison Cuevas  [id number]   12/07/2020, 9:36 AM  [place], [place], [address] Phone: [phone]   Fax:  [phone]  Name: Alison Cuevas MRN: [id number] Date of Birth: 10/21/1945

## 2020-12-09 ENCOUNTER — Encounter (HOSPITAL_COMMUNITY): Payer: Self-pay

## 2020-12-09 ENCOUNTER — Other Ambulatory Visit: Payer: Self-pay

## 2020-12-09 ENCOUNTER — Ambulatory Visit (HOSPITAL_COMMUNITY): Payer: Medicare Other | Admitting: Physical Therapy

## 2020-12-09 ENCOUNTER — Ambulatory Visit (HOSPITAL_COMMUNITY): Payer: Medicare Other

## 2020-12-09 DIAGNOSIS — R278 Other lack of coordination: Secondary | ICD-10-CM

## 2020-12-09 DIAGNOSIS — R29898 Other symptoms and signs involving the musculoskeletal system: Secondary | ICD-10-CM | POA: Diagnosis not present

## 2020-12-09 NOTE — Therapy (Signed)
[place], [place], [address] Phone: [phone]   Fax:  [phone]  Occupational Therapy Treatment Reassessment/re-cert Patient Details  Name: Sallyann Kinnaird MRN: [id number] Date of Birth: 11-12-1945 Referring Provider (OT): Alger Simons, MD   Encounter Date: 12/09/2020   OT End of Session - 12/09/20 1741     Visit Number 24    Number of Visits 25    Date for OT Re-Evaluation 12/18/20    Authorization Type double coverage Medicare 80/20% secondary: [employer] Covers what medicare doesn't.    Progress Note Due on Visit 27    OT Start Time 1605   reassessment   OT Stop Time 1703    OT Time Calculation (min) 58 min    Activity Tolerance Patient tolerated treatment well    Behavior During Therapy [facility] for tasks assessed/performed   Moments of somber and able to smile at times.            Past Medical History:  Diagnosis Date   Allergic rhinitis    maple pollen   Anxiety    Arthritis    Bowel obstruction (HCC)    Chronic low back pain    Dr. Trenton Gammon.  Has had back injection--bp elevated after.   Chronic pain syndrome    Chronic renal insufficiency, stage 2 (mild)    GFR 60-70   DDD (degenerative disc disease), cervical    Hx of ACDF (Dr. [name]).  Followed by Dr. [name].  Also, Dr. [name] do left C7-T1 intralaminal epidural injection.   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  Resid R arm and leg weakness + flexion contractures.   Hypercholesteremia    mild; pt declined statin trial 05/2014--needs recheck lipid panel at first f/u visit in 2017   Hypertension    Osteopenia 06/2017   T score -1.4 FRAX 15% / 2%   Ovarian cyst 2017   82019-robotic assisted bilatel SPO:  all path benign.   Perianal dermatitis    prn cutivate   Phlebitis    Right hemiplegia ([place]) 01/2020   R arm and leg (hemorrhagic L cerebral hemisphere CVA)   Right knee injury 2018   Patellofemoral crush injury--Dr. [name].   Stroke [facility])    Trochanteric bursitis of right hip    Recurrent (injection by Dr. 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 [name]). Recurrent fungal/bact OE.   Varicose vein    left leg     Past Surgical History:  Procedure Laterality Date   ABDOMINAL HYSTERECTOMY  03/2015   TAH.  Last pap 2016.  No hx of abnl paps.  Per GYN, no further pap smears indicated.   ANTERIOR AND POSTERIOR REPAIR N/A 03/18/2014   Procedure: Cystocele repair with graft, Vault suspension, Rectocele repair;  Surgeon: [name], MD;  [facility];  Service: Urology;  Laterality: N/A;   CHOLECYSTECTOMY     CHOLECYSTECTOMY, LAPAROSCOPIC     COLON SURGERY     COLONOSCOPY  04/2006; 05/26/16   2007 (Dr. [name]): Normal.  04/2016 (Dr. [name]) normal except diverticulosis and decreased anal sphincter tone.  No repeat colonoscopy is recommended due to age.   cspine surgery  Dr. Wiliam Ke level ant cerv discectomy [name] w/plating   CYSTO N/A 03/18/2014   Procedure: CYSTO;  Surgeon: [name], MD;  [facility];  Service: Urology;  Laterality: N/A;   DEXA  07/30/2015; 06/2018   2017 and 2020 -->Osteopenia--repeat 2 yrs.   LYSIS OF ADHESION N/A 01/31/2018   Procedure: POSSIBLE LYSIS OF ADHESION;  Surgeon: Everitt Amber, MD;  [facility];  Service: Gynecology;  Laterality: N/A;   [place]     with prolasped bledder repair   RESECTION OF COLON     BENIGN TUMOR   RIGHT EAR SURGERY  08/23/2017   Dr. 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  Procedure: XI ROBOTIC ASSISTED BILATERAL SALPINGO OOPHORECTOMY;  Surgeon: Everitt Amber, MD;  [facility];  Service: Gynecology;  Laterality: Bilateral;   TRANSTHORACIC ECHOCARDIOGRAM  01/27/2020   EF 65-70%, NORMAL.   TUBAL LIGATION      There were no vitals filed for this visit.   Subjective Assessment - 12/09/20 1617     Subjective  S: I dont have pain. It's more of discomfort.    Currently in Pain? No/denies                [facility] OT Assessment - 12/09/20 1632       Assessment   Medical Diagnosis Right side weakness S/P CVA    Referring Provider (OT) Alger Simons, MD    Onset Date/Surgical Date 01/26/20      Precautions   Precautions Fall      Prior Function   Level of Independence Independent with basic ADLs      Coordination   9 Hole Peg Test Right    Right 9 Hole Peg Test 39.3"   previous: 37.5"   Box and Blocks R: 33   previous: 29     ROM / Strength   AROM / PROM / Strength Strength      Strength   Overall Strength Comments Assessed seated. IR/er adducted    Strength Assessment Site Shoulder;Elbow    Right Shoulder Flexion 4+/5   previous: same   Right Shoulder ABduction 5/5   previous: 4+/5   Right Shoulder Internal Rotation 5/5   previous: same   Right Shoulder External Rotation 4-/5   previous: 4+/5   Right/Left Elbow Right    Right Elbow Flexion 5/5   previous: same   Right Elbow Extension 5/5   previous: 5/5                     OT Treatments/Exercises (OP) - 12/09/20 0001       Manual Therapy   Manual Therapy Taping    Other Manual Therapy Kinsiotape applied to left lower  extremity to help prevent knee hyperextension. Used one I strip placed at back of knee.                    OT Education - 12/09/20 1740     Education Details Discussed progress in therapy and how motor ataxia has not  improved. Recommended completing a family training session for kinsiotape application for right shoulder and knee.    Person(s) Educated Patient    Methods Explanation    Comprehension Verbalized understanding              OT Short Term Goals - 10/19/20 1246       OT SHORT TERM GOAL #1   Title Pt will be educated and independent with HEP in order to faciliate progress in therapy and work towards increasing her RUE strength and coordination while increasing her functional performance during ADL tasks.    Time 3    Period Weeks    Target Date 09/23/20               OT Long Term Goals - 12/09/20 1617       OT LONG TERM GOAL #1   Title Patient will increase right shoulder strength and elbow to 5/5 while demonstrating increased shoulder and scapular stability when reaching away from her body/base of support.    Time 6    Period Weeks    Status Partially Met      OT LONG TERM GOAL #2   Title Patient will increase right hand coordination by completing the 9 hole peg test in 30 seconds or less in order to roll hair with less difficulty.    Time 6    Period Weeks    Status Not Met      OT LONG TERM GOAL #3   Title Patient will increase right hand grip strength by 10# and pinch strength by 5# in order to increase ability to grasp and manipulate items with less difficulty.    Time 6    Period Weeks      OT LONG TERM GOAL #4   Title patient will reach highest level of independence with increased confidence and safety awareness while completing all ADL tasks at modified independent level or better while utilizing adaptive equipment and/or compensatory strategies if needed.    Time 6    Period Weeks      OT LONG TERM GOAL #5   Title Patient will  increase her gross motor coordination in her right UE by moving 38 blocks within a minute during the box and blocks test in order to increase her ability to roll her hair with modified independence.    Time 4    Period Weeks    Status Partially Met      OT LONG TERM GOAL #6   Title Patient will be able to demonstrate  increased LUE strength and endurance in order to complete simple housekeeping tasks such as cleaning/dusting and making her bed with supervision if needed.    Time 4    Period Weeks    Status Not Met                   Plan - 12/09/20 1741     Clinical Impression Statement A: reassessment completed this date. At this point, patient has met 2/6 long term goals with 2 additional goals partially met. 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 Decreased control is more prominent when completing fine motor tasks. Her time for the 9 hole peg test had increased this date with patient experiencing increased difficulty. She did move more blocks during the Box and blocks test increasing from 29 to 33. She is not pleased with her functional performance and is hoping that  her deficits will resolve. Discussed how a CVA can effect everyone differently depending on where it is located in the brain and it's very common to not return to 100% where she was previously.  During her course of therapy we have discussed activity modifications and ways to change tasks she'd like to complete in order to succeed. application of kinsiotape to right shoulder has helped slightly with stability and pain. At this point, I recommended that we complete one more session for family training on kinsiotape application and then follow up with Dr. Naaman Plummer to discuss any solution to helping the motor ataxia as therapy has been working on coordination and strength with little improvement.    OT Frequency 1x / week    OT Duration Other  (comment)   1 week   Plan P: 1 more tx session for family education on kinsiotape application.    Consulted and Agree with Plan of Care Patient             Patient will benefit from skilled therapeutic intervention in order to improve the following deficits and impairments:           Visit Diagnosis: Other lack of coordination - Plan: Ot plan of care cert/re-cert    Problem List Patient Active Problem List   Diagnosis Date Noted   Thrombocytopenia, unspecified ([place]) 11/19/2020   Pain in joint of right foot 04/15/2020   Right spastic hemiparesis ([place]) 03/04/2020   Chronic otitis externa of right ear 02/20/2020   Intraparenchymal hemorrhage of brain ([place])    Delirium    ICH (intracerebral hemorrhage) ([place]) - L frontoparietal, hypertensive 01/26/2020   Stroke (cerebrum) ([place]) 01/26/2020   Conductive hearing loss of right ear with unrestricted hearing of left ear 05/28/2019   Hyponatremia 08/31/2017   Generalized weakness 08/31/2017   Dehydration 08/31/2017   UTI (urinary tract infection) 08/31/2017   Tympanic membrane rupture, right 12/01/2016   Right ovarian cyst 08/22/2016   Fibromyalgia 06/03/2015   Cystocele 03/18/2014   Tick-borne disease 12/25/2013   Diverticulosis of colon without hemorrhage 06/12/2013   Hypertension    Osteopenia    Chronic low back pain 05/12/2011   HYPERCHOLESTEROLEMIA 03/17/2008   Anxiety state 09/17/2007   GERD 09/17/2007   DJD (degenerative joint disease) 09/17/2007    Ailene Ravel, OTR/L,CBIS  [phone]   12/09/2020, 5:49 PM  [facility] [address] [place], [place], [address] Phone: [phone]   Fax:  [phone]  Name: Majorie Santee MRN: [id number] Date of Birth: 06/19/1946

## 2020-12-15 ENCOUNTER — Other Ambulatory Visit: Payer: Self-pay

## 2020-12-15 ENCOUNTER — Ambulatory Visit (HOSPITAL_COMMUNITY)
Admission: RE | Admit: 2020-12-15 | Discharge: 2020-12-15 | Disposition: A | Discharge status: Home / Self Care | Payer: Medicare Other | Source: Ambulatory Visit | Attending: Nurse Practitioner | Admitting: Nurse Practitioner

## 2020-12-15 DIAGNOSIS — R928 Other abnormal and inconclusive findings on diagnostic imaging of breast: Secondary | ICD-10-CM | POA: Diagnosis not present

## 2020-12-15 DIAGNOSIS — R922 Inconclusive mammogram: Secondary | ICD-10-CM | POA: Diagnosis not present

## 2020-12-18 ENCOUNTER — Encounter (HOSPITAL_COMMUNITY): Payer: Self-pay

## 2020-12-18 ENCOUNTER — Ambulatory Visit (HOSPITAL_COMMUNITY): Payer: Medicare Other

## 2020-12-18 ENCOUNTER — Other Ambulatory Visit: Payer: Self-pay

## 2020-12-18 ENCOUNTER — Telehealth (HOSPITAL_COMMUNITY): Payer: Self-pay

## 2020-12-18 DIAGNOSIS — R278 Other lack of coordination: Secondary | ICD-10-CM

## 2020-12-18 DIAGNOSIS — R29898 Other symptoms and signs involving the musculoskeletal system: Secondary | ICD-10-CM | POA: Diagnosis not present

## 2020-12-18 NOTE — Therapy (Signed)
[place], [place], [address] Phone: [phone]   Fax:  [phone]  Occupational Therapy Treatment  Patient Details  Name: Alison Cuevas MRN: [id number] Date of Birth: [birth year] Referring Provider (OT): Alger Simons, MD   Encounter Date: 12/18/2020   OT End of Session - 12/18/20 1141     Visit Number 25    Number of Visits 25    Authorization Type double coverage Medicare 80/20% secondary: [employer] Covers what medicare doesn't.    Progress Note Due on Visit 62    OT Start Time 1030    OT Stop Time 1115    OT Time Calculation (min) 45 min    Activity Tolerance Patient tolerated treatment well    Behavior During Therapy WFL for tasks assessed/performed             Past Medical History:  Diagnosis Date   Allergic rhinitis    maple pollen   Anxiety    Arthritis    Bowel obstruction (HCC)    Chronic low back pain    Dr. Trenton Gammon.  Has had back injection--bp elevated after.   Chronic pain syndrome    Chronic renal insufficiency, stage 2 (mild)    GFR 60-70   DDD (degenerative disc disease), cervical    Hx of ACDF (Dr. [name]).  Followed by Dr. [name].  Also, Dr. [name] do left C7-T1 intralaminal epidural injection.   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  Resid R arm and leg weakness + flexion contractures.   Hypercholesteremia    mild; pt declined statin trial 05/2014--needs recheck lipid panel at first f/u visit in 2017   Hypertension    Osteopenia 06/2017   T score -1.4 FRAX 15% / 2%   Ovarian cyst 2017   82019-robotic assisted bilatel SPO: all path benign.   Perianal dermatitis    prn cutivate   Phlebitis    Right hemiplegia ([place]) 01/2020   R arm and  leg (hemorrhagic L cerebral hemisphere CVA)   Right knee injury 2018   Patellofemoral crush injury--Dr. [name].   Stroke [facility])    Trochanteric bursitis of right hip    Recurrent (injection by Dr. 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 [name]). Recurrent fungal/bact OE.   Varicose vein    left leg     Past Surgical History:  Procedure Laterality Date   ABDOMINAL HYSTERECTOMY  03/2015   TAH.  Last pap 2016.  No hx of abnl paps.  Per GYN, no further pap smears indicated.   ANTERIOR AND POSTERIOR REPAIR N/A 03/18/2014   Procedure: Cystocele repair with graft, Vault suspension, Rectocele repair;  Surgeon: [name], MD;  Location: [facility];  Service: Urology;  Laterality: N/A;   CHOLECYSTECTOMY     CHOLECYSTECTOMY, LAPAROSCOPIC     COLON SURGERY     COLONOSCOPY  04/2006; 05/26/16   2007 (Dr. [name]): Normal.  04/2016 (Dr. [name]) normal except diverticulosis and decreased anal sphincter tone.  No repeat colonoscopy is recommended due to age.   cspine surgery     Dr. Wiliam Ke level ant cerv discectomy /fusion w/plating   CYSTO N/A 03/18/2014   Procedure:  CYSTO;  Surgeon: [name], MD;  Location: [facility];  Service: Urology;  Laterality: N/A;   DEXA  07/30/2015; 06/2018   2017 and 2020 -->Osteopenia--repeat 2 yrs.   LYSIS OF ADHESION N/A 01/31/2018   Procedure: POSSIBLE LYSIS OF ADHESION;  Surgeon: Everitt Amber, MD;  Location: [facility];  Service: Gynecology;  Laterality: N/A;   [place]     with prolasped bledder repair   RESECTION OF COLON     BENIGN TUMOR   RIGHT EAR SURGERY  08/23/2017   Dr. 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  Procedure: XI ROBOTIC ASSISTED BILATERAL SALPINGO OOPHORECTOMY;  Surgeon: Everitt Amber, MD;  Location: [facility];  Service: Gynecology;  Laterality: Bilateral;   TRANSTHORACIC ECHOCARDIOGRAM  01/27/2020   EF 65-70%, NORMAL.   TUBAL LIGATION      There were no vitals filed for this visit.   Subjective Assessment - 12/18/20 1133     Subjective  S: Can you look through my exercises and show me which ones I should be doing?    Patient is accompanied by: Family member   Son- Bud   Currently in Pain? No/denies                [facility] OT Assessment - 12/18/20 1137       Assessment   Medical Diagnosis Right side weakness S/P CVA      Precautions   Precautions Fall                              OT Education - 12/18/20 1137     Education Details Family/patient education completed today for application of kinsiotape to right shoulder for shoulder impingement/rotator cuff weakness. Reviewed placement, percentage of tension, application, wearing schedule (frequency and duration), removal technique. Reviewed HEP while removing the handouts that are no longer beneficial to patient's goal of increasing motor control and shoulder stability. Discussed the importance of proper nutrition intake and the negative impact it has on her body. Encouraged grief counseling to work through husband's passing as her emotional/mental state will affect her physical self.    Person(s) Educated Patient;Child(ren)    Methods Explanation;Demonstration;Verbal cues;Tactile cues;Handout    Comprehension Verbalized understanding              OT Short Term Goals - 10/19/20 1246       OT SHORT TERM GOAL #1   Title Pt will be educated and independent  with HEP in order to faciliate progress in therapy and work towards increasing her RUE strength and coordination while increasing her functional performance during ADL tasks.    Time 3    Period Weeks    Target Date 09/23/20               OT Long Term Goals - 12/09/20 1617       OT LONG TERM GOAL #1   Title Patient will increase right shoulder strength and elbow to 5/5 while demonstrating increased shoulder and scapular stability when  reaching away from her body/base of support.    Time 6    Period Weeks    Status Partially Met      OT LONG TERM GOAL #2   Title Patient will increase right hand coordination by completing the 9 hole peg test in 30 seconds or less in order to roll hair with less difficulty.    Time 6    Period Weeks    Status Not Met      OT LONG TERM GOAL #3   Title Patient will increase right hand grip strength by 10# and pinch strength by 5# in order to increase ability to grasp and manipulate items with less difficulty.    Time 6    Period Weeks      OT LONG TERM GOAL #4   Title patient will reach highest level of independence with increased confidence and safety awareness while completing all ADL tasks at modified independent level or better while utilizing adaptive equipment and/or compensatory strategies if needed.    Time 6    Period Weeks      OT LONG TERM GOAL #5   Title Patient will increase her gross motor coordination in her right UE by moving 38 blocks within a minute during the box and blocks test in order to increase her ability to roll her hair with modified independence.    Time 4    Period Weeks    Status Partially Met      OT LONG TERM GOAL #6   Title Patient will be able to demonstrate  increased LUE strength and endurance in order to complete simple housekeeping tasks such as cleaning/dusting and making her bed with supervision if needed.    Time 4    Period Weeks    Status Not Met                   Plan - 12/18/20 1141      Clinical Impression Statement A: Family and patient education completed this date. Son agreed with education and recommendations made by therapist. Pt required more explanation of the negative impact of overdoing her exercises and not taking in enough nutritients to provide her body with appropriate amount of energy, endurance, strength, etc. HEP was reviewed and obsolete OT handouts were removed. Kinsiotape application education was provided to Son and patient. Son verbalized understanding.    Body Structure / Function / Physical Skills ADL;UE functional use;FMC;ROM;Proprioception;Coordination;GMC;Strength    OT Treatment/Interventions Self-care/ADL training;Ultrasound;Patient/family education;Visual/perceptual remediation/compensation;DME and/or AE instruction;Passive range of motion;Cryotherapy;Electrical Stimulation;Moist Heat;Neuromuscular education;Therapeutic activities;Manual Therapy;Therapeutic exercise;Splinting    Plan P: Discharge from OT services with HEP. Continue with HEP at home focusing on shoulder stability and motor control. Family to assist with kinsiotape to right shoulder to help with shoulder pain, weakness, and feeling of instability. Follow up with Dr. Naaman Plummer at scheduled visit. May return to OT for re-evaluation after 5-6 months of HEP completion at home to determine if returning to OT would be recommended.    Consulted and Agree with Plan of Care Patient;Family member/caregiver    Family Member Consulted Son             Patient will benefit from skilled therapeutic intervention in order to improve the following deficits and impairments:   Body Structure / Function / Physical Skills: ADL, UE functional use, FMC, ROM, Proprioception, Coordination, GMC, Strength       Visit Diagnosis: Other lack of coordination  Other symptoms and signs involving the musculoskeletal system  Problem List Patient Active Problem List   Diagnosis Date Noted   Thrombocytopenia,  unspecified ([place]) 11/19/2020   Pain in joint of right foot 04/15/2020   Right spastic hemiparesis ([place]) 03/04/2020   Chronic otitis externa of right ear 02/20/2020   Intraparenchymal hemorrhage of brain ([place])    Delirium    ICH (intracerebral hemorrhage) ([place]) - L frontoparietal, hypertensive 01/26/2020   Stroke (cerebrum) ([place]) 01/26/2020   Conductive hearing loss of right ear with unrestricted hearing of left ear 05/28/2019   Hyponatremia 08/31/2017   Generalized weakness 08/31/2017   Dehydration 08/31/2017   UTI (urinary tract infection) 08/31/2017   Tympanic membrane rupture, right 12/01/2016   Right ovarian cyst 08/22/2016   Fibromyalgia 06/03/2015   Cystocele 03/18/2014   Tick-borne disease 12/25/2013   Diverticulosis of colon without hemorrhage 06/12/2013   Hypertension    Osteopenia    Chronic low back pain 05/12/2011   HYPERCHOLESTEROLEMIA 03/17/2008   Anxiety state 09/17/2007   GERD 09/17/2007   DJD (degenerative joint disease) 09/17/2007   OCCUPATIONAL THERAPY DISCHARGE SUMMARY  Visits from Start of Care: 25  Current functional level related to goals / functional outcomes: patient has met 2/6 long term goals with 2 additional goals partially met. 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 Decreased control is more prominent when completing fine motor tasks. Her time for the 9 hole peg test had increased this date with patient experiencing increased difficulty. She did move more blocks during the Box and blocks test increasing from 29 to 33.   Remaining deficits: Decreased motor control of right UE.    Education / Equipment: During her course of therapy we have discussed activity modifications and ways to change tasks she'd like to complete in order to succeed. application of kinsiotape to right shoulder has helped slightly with stability and pain.   Patient agrees to discharge.  Patient goals were partially met. Patient is being discharged due to lack of progress./plateau.       Ailene Ravel, OTR/L,CBIS  [phone]   12/18/2020, 11:47 AM  [facility] [address] [place], [place], [address] Phone: [phone]   Fax:  [phone]  Name: Nykayla Marcelli MRN: [id number] Date of Birth: 1945-10-27

## 2020-12-21 NOTE — Telephone Encounter (Signed)
Opened in error

## 2020-12-22 ENCOUNTER — Ambulatory Visit: Payer: Medicare Other | Admitting: Family Medicine

## 2020-12-29 DIAGNOSIS — H90A31 Mixed conductive and sensorineural hearing loss, unilateral, right ear with restricted hearing on the contralateral side: Secondary | ICD-10-CM | POA: Diagnosis not present

## 2020-12-29 DIAGNOSIS — H938X1 Other specified disorders of right ear: Secondary | ICD-10-CM | POA: Diagnosis not present

## 2020-12-29 DIAGNOSIS — H7291 Unspecified perforation of tympanic membrane, right ear: Secondary | ICD-10-CM | POA: Diagnosis not present

## 2020-12-29 DIAGNOSIS — Z9889 Other specified postprocedural states: Secondary | ICD-10-CM | POA: Diagnosis not present

## 2020-12-29 DIAGNOSIS — H90A22 Sensorineural hearing loss, unilateral, left ear, with restricted hearing on the contralateral side: Secondary | ICD-10-CM | POA: Diagnosis not present

## 2020-12-31 ENCOUNTER — Other Ambulatory Visit: Payer: Self-pay | Admitting: Family Medicine

## 2021-01-04 ENCOUNTER — Other Ambulatory Visit: Payer: Self-pay

## 2021-01-04 MED ORDER — HYDROCODONE-ACETAMINOPHEN 5-325 MG PO TABS: ORAL_TABLET | ORAL | 0 refills | Status: DC

## 2021-01-04 NOTE — Telephone Encounter (Signed)
Requesting: Norco Contract: 03/06/19 UDS: n/a Last Visit:11/19/20 Next Visit:02/16/21 Last Refill:09/15/20(15,0)  Please Advise. Medication pending

## 2021-01-04 NOTE — Telephone Encounter (Signed)
Patient refill request  HYDROcodone-acetaminophen (NORCO/VICODIN) 5-325 MG tablet [[id number]]    [id number] - [place], [place] -

## 2021-01-05 NOTE — Telephone Encounter (Signed)
Pt's son, Juanda Crumble advised rx sent.

## 2021-01-13 ENCOUNTER — Other Ambulatory Visit: Payer: Self-pay

## 2021-01-13 ENCOUNTER — Encounter: Payer: Self-pay | Admitting: Physical Medicine & Rehabilitation

## 2021-01-13 ENCOUNTER — Encounter: Payer: Medicare Other | Attending: Physical Medicine & Rehabilitation | Admitting: Physical Medicine & Rehabilitation

## 2021-01-13 VITALS — BP 129/69 | HR 89 | Temp 98.4°F | Ht 60.0 in | Wt 111.6 lb

## 2021-01-13 DIAGNOSIS — G8111 Spastic hemiplegia affecting right dominant side: Secondary | ICD-10-CM

## 2021-01-13 NOTE — Patient Instructions (Signed)
PLEASE FEEL FREE TO CALL OUR OFFICE WITH ANY PROBLEMS OR QUESTIONS ([phone])      

## 2021-01-13 NOTE — Progress Notes (Signed)
Botox Injection for spasticity using needle EMG guidance Indication: Right spastic hemiparesis (HCC) - Plan: Ambulatory referral to Physical Therapy   Dilution: 100 Units/ml        Total Units Injected: 400 Indication: Severe spasticity which interferes with ADL,mobility and/or  hygiene and is unresponsive to medication management and other conservative care Informed consent was obtained after describing risks and benefits of the procedure with the patient. This includes bleeding, bruising, infection, excessive weakness, or medication side effects. A REMS form is on file and signed.  right Needle: 23mm injectable monopolar needle electrode  Number of units per muscle Pectoralis Major   units Pectoralis Minor   units Biceps   units Brachioradialis   units FCR   units FCU   units FDS   units FDP   units FPL   units Pronator Teres   units Pronator Quadratus   units Lumbricals   units Quadriceps 100 units, rectus femoris 3 access points Gastroc/soleus 200 units 4 access points Hamstrings 0 units Tibialis Posterior 100 units 3 access points Tibialis Anterior 0 units EHL 0 units All injections were done after obtaining appropriate EMG activity and after negative drawback for blood. The patient tolerated the procedure well. Post procedure instructions were given. Return in about 3 months (around 04/15/2021).

## 2021-01-20 ENCOUNTER — Encounter: Payer: Self-pay | Admitting: Family Medicine

## 2021-01-20 ENCOUNTER — Encounter: Payer: Self-pay | Admitting: Adult Health

## 2021-01-20 DIAGNOSIS — R531 Weakness: Secondary | ICD-10-CM | POA: Diagnosis not present

## 2021-01-20 DIAGNOSIS — Z8673 Personal history of transient ischemic attack (TIA), and cerebral infarction without residual deficits: Secondary | ICD-10-CM | POA: Diagnosis not present

## 2021-01-20 DIAGNOSIS — G9389 Other specified disorders of brain: Secondary | ICD-10-CM | POA: Diagnosis not present

## 2021-01-20 DIAGNOSIS — I447 Left bundle-branch block, unspecified: Secondary | ICD-10-CM | POA: Diagnosis not present

## 2021-01-20 DIAGNOSIS — S0003XA Contusion of scalp, initial encounter: Secondary | ICD-10-CM | POA: Diagnosis not present

## 2021-01-20 DIAGNOSIS — I10 Essential (primary) hypertension: Secondary | ICD-10-CM | POA: Diagnosis not present

## 2021-01-20 DIAGNOSIS — R208 Other disturbances of skin sensation: Secondary | ICD-10-CM | POA: Diagnosis not present

## 2021-01-20 DIAGNOSIS — J323 Chronic sphenoidal sinusitis: Secondary | ICD-10-CM | POA: Diagnosis not present

## 2021-01-20 DIAGNOSIS — R9082 White matter disease, unspecified: Secondary | ICD-10-CM | POA: Diagnosis not present

## 2021-01-20 DIAGNOSIS — Z7982 Long term (current) use of aspirin: Secondary | ICD-10-CM | POA: Diagnosis not present

## 2021-01-20 DIAGNOSIS — Z79899 Other long term (current) drug therapy: Secondary | ICD-10-CM | POA: Diagnosis not present

## 2021-01-20 DIAGNOSIS — E871 Hypo-osmolality and hyponatremia: Secondary | ICD-10-CM | POA: Diagnosis not present

## 2021-01-20 DIAGNOSIS — G319 Degenerative disease of nervous system, unspecified: Secondary | ICD-10-CM | POA: Diagnosis not present

## 2021-01-20 DIAGNOSIS — R519 Headache, unspecified: Secondary | ICD-10-CM | POA: Diagnosis not present

## 2021-01-20 DIAGNOSIS — J3489 Other specified disorders of nose and nasal sinuses: Secondary | ICD-10-CM | POA: Diagnosis not present

## 2021-01-20 DIAGNOSIS — R200 Anesthesia of skin: Secondary | ICD-10-CM | POA: Diagnosis not present

## 2021-01-21 NOTE — Telephone Encounter (Signed)
Reviewed admission data - CT head negative for acute concerns. 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  If symptoms persist or worsen, we could try to schedule sooner follow-up visit for further evaluation but at this time, recommend continued monitoring.  Also please ensure they schedule follow-up with PCP as advised at discharge.

## 2021-01-21 NOTE — Telephone Encounter (Signed)
FYI

## 2021-01-26 ENCOUNTER — Ambulatory Visit (HOSPITAL_COMMUNITY): Payer: Medicare Other | Attending: Physical Medicine & Rehabilitation | Admitting: Physical Therapy

## 2021-01-26 ENCOUNTER — Other Ambulatory Visit: Payer: Self-pay

## 2021-01-26 DIAGNOSIS — R278 Other lack of coordination: Secondary | ICD-10-CM | POA: Insufficient documentation

## 2021-01-26 DIAGNOSIS — M6281 Muscle weakness (generalized): Secondary | ICD-10-CM | POA: Insufficient documentation

## 2021-01-26 DIAGNOSIS — R29898 Other symptoms and signs involving the musculoskeletal system: Secondary | ICD-10-CM | POA: Diagnosis not present

## 2021-01-26 DIAGNOSIS — R262 Difficulty in walking, not elsewhere classified: Secondary | ICD-10-CM | POA: Diagnosis not present

## 2021-01-26 DIAGNOSIS — R2689 Other abnormalities of gait and mobility: Secondary | ICD-10-CM | POA: Insufficient documentation

## 2021-01-26 NOTE — Therapy (Signed)
[place], [place], [address] Phone: [phone]   Fax:  [phone]  Physical Therapy Evaluation  Patient Details  Name: Alison Cuevas MRN: [id number] Date of Birth: 1945-07-25 Referring Provider (PT): Alger Simons MD   Encounter Date: 01/26/2021   PT End of Session - 01/26/21 1523     Visit Number 1    Number of Visits 6    Date for PT Re-Evaluation 03/09/21    Authorization Type Medicare A/ [place] NAP 2ndary    Progress Note Due on Visit 10    PT Start Time 1454    PT Stop Time 1536    PT Time Calculation (min) 42 min             Past Medical History:  Diagnosis Date   Allergic rhinitis    maple pollen   Anxiety    Arthritis    Bowel obstruction ([place])    Chronic low back pain    Dr. Trenton Gammon.  Has had back injection--bp elevated after.   Chronic pain syndrome    Chronic renal insufficiency, stage 2 (mild)    GFR 60-70   DDD (degenerative disc disease), cervical    Hx of ACDF (Dr. [name]).  Followed by Dr. [name].  Also, Dr. [name] do left C7-T1 intralaminal epidural injection.   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  Resid R arm and leg weakness + flexion contractures.   Hypercholesteremia    mild; pt declined statin trial 05/2014--needs recheck lipid panel at first f/u visit in 2017   Hypertension    Osteopenia 06/2017   T score -1.4 FRAX 15% / 2%   Ovarian cyst 2017   82019-robotic assisted bilatel SPO: all path benign.   Perianal dermatitis    prn cutivate   Phlebitis    Right hemiplegia ([place]) 01/2020   R arm and leg (hemorrhagic L cerebral hemisphere CVA)   Right knee injury 2018   Patellofemoral crush injury--Dr. [name].   Stroke [facility])     Trochanteric bursitis of right hip    Recurrent (injection by Dr. 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 [name]). Recurrent fungal/bact OE.   Varicose vein    left leg     Past Surgical History:  Procedure Laterality Date   ABDOMINAL HYSTERECTOMY  03/2015   TAH.  Last pap 2016.  No hx of abnl paps.  Per GYN, no further pap smears indicated.   ANTERIOR AND POSTERIOR REPAIR N/A 03/18/2014   Procedure: Cystocele repair with graft, Vault suspension, Rectocele repair;  Surgeon: [name], MD;  [facility];  Service: Urology;  Laterality: N/A;   CHOLECYSTECTOMY     CHOLECYSTECTOMY, LAPAROSCOPIC     COLON SURGERY     COLONOSCOPY  04/2006; 05/26/16   2007 (Dr. [name]): Normal.  04/2016 (Dr. [name]) normal except diverticulosis and decreased anal sphincter tone.  No repeat colonoscopy is recommended due to age.   cspine surgery     Dr. Wiliam Ke level ant cerv discectomy [name] w/plating   CYSTO N/A 03/18/2014   Procedure: CYSTO;  Surgeon: [name], MD;  [facility];  Service: Urology;  Laterality:  N/A;   DEXA  07/30/2015; 06/2018   2017 and 2020 -->Osteopenia--repeat 2 yrs.   LYSIS OF ADHESION N/A 01/31/2018   Procedure: POSSIBLE LYSIS OF ADHESION;  Surgeon: Everitt Amber, MD;  [facility];  Service: Gynecology;  Laterality: N/A;   [place]     with prolasped bledder repair   RESECTION OF COLON     BENIGN TUMOR   RIGHT EAR SURGERY  08/23/2017   Dr. 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  Procedure: XI ROBOTIC ASSISTED BILATERAL SALPINGO OOPHORECTOMY;  Surgeon: Everitt Amber, MD;  [facility];  Service: Gynecology;  Laterality: Bilateral;   TRANSTHORACIC ECHOCARDIOGRAM  01/27/2020   EF 65-70%, NORMAL.   TUBAL LIGATION      There were no vitals filed for this visit.    Subjective Assessment - 01/26/21 1620     Pertinent History Got injection in quads and PF on 01/13/21. States that her leg has progressively turned out more and more and now she is having pain along the outside of her foot. States that she has been using her jas brace 2x/day for 30 minutes in each position. States that she is still using it but feels her foot has started to turn out. States that she has a lot of pain along the 5th met. States that it hurts real bad. States she is frustrated and doesn't know what to do.    Limitations Standing;Walking;House hold activities    Patient Stated Goals to walk better/ with less pain    Currently in Pain? Yes    Pain Score --   hurts a lot   Pain Location Foot    Pain Orientation Right    Pain Descriptors / Indicators Tender;Sore   just painful   Pain Radiating Towards side of foot                [facility] PT Assessment - 01/26/21 0001       Assessment   Medical Diagnosis Right side weakness S/P CVA    Onset Date/Surgical Date 01/26/20      Precautions   Precautions Fall      Strength   Right Hip Flexion 4/5    Left Hip Flexion 4+/5    Right Knee Flexion 4+/5    Right Knee Extension 4+/5    Left Knee Flexion 5/5    Left Knee Extension 5/5    Right Ankle Dorsiflexion 2+/5   increased tone/flexed and inverted   Left Ankle Dorsiflexion 4+/5               AFO assessment:   Right foot inverting and patient walking along lateral aspect of foot at the 5th met and AFO base is pressing itno the side of her foot where she has pain.    Foot  assessment: Continued Inversion and plantarflexion resting position with high tone in these muscles         Objective measurements completed on examination: See above findings.  PT Education - 01/26/21 1619     Education Details on current presentation, on following up with MD about order for orthotic adjustment, on plan with PT, on current functioanl status with changes post CVA    Person(s) Educated Patient;Child(ren)    Methods Explanation    Comprehension Verbalized understanding              PT Short Term Goals - 01/26/21 1554       PT SHORT TERM GOAL #1   Title Patient will be independent with initial HEP and self-management strategies to improve functional outcomes    Time 3    Period Weeks    Status New    Target Date 02/16/21      PT SHORT TERM GOAL #2   Title Patient will report at least 25% improvement in overall symptoms and/or function to demonstrate improved functional mobility    Time 3    Period Weeks    Status New    Target Date 02/16/21               PT Long Term Goals - 01/26/21 1556       PT LONG TERM GOAL #1   Title Patient will report at least 50% improvement in overall symptoms and/or function to demonstrate improved functional mobility    Time 6    Period Weeks    Status New    Target Date 03/09/21      PT LONG TERM GOAL #2   Title Patient will be indpendent in advanced HEP to improve functional outcomes.    Time 6    Period Weeks    Status New    Target Date 03/09/21      PT LONG TERM GOAL #3   Title Patient will report making apt with orthotic place to get new orthotic for right LE    Time 6    Period Weeks    Status New    Target Date 03/09/21                    Plan - 01/26/21 1617     Clinical Impression Statement Patient is known to the clinic for previous treatment after suffering a CVA on 01/26/20. Patient with complaints of increased foot pain with walking despite HEP adherence  and adherence to using KAS brace 2x/day for 30 minutes in each position. Patient with concerns that she is regressing and hurting herself more with her walking as she is having more pain. Patient presents with continued spasticity but with recent injections in her right gastroc and quadriceps. Patient would most likely to benefit from new AFO that can help control inversion/plantar flexion spasticity. Will contact MD about this and send order for orthotic to MD. Patient will benefit from skilled physical therapy to work on motion    Personal Factors and Comorbidities Age;Comorbidity 1    Comorbidities CVA    Examination-Activity Limitations Locomotion Level;Bed Mobility;Stand;Stairs;Transfers    Examination-Participation Restrictions Community Activity;Yard Work;Laundry;Driving;Cleaning    Stability/Clinical Decision Making Stable/Uncomplicated    Clinical Decision Making Low    Rehab Potential Good    PT Frequency 1x / week    PT Duration 6 weeks    PT Treatment/Interventions ADLs/Self Care Home Management;Aquatic Therapy;Iontophoresis 28m/ml Dexamethasone;DME Instruction;Balance training;Passive range of motion;Scar mobilization;Neuromuscular re-education;Gait training;Stair training;Functional mobility training;Patient/family education;Manual techniques;Dry needling;Energy conservation;Manual lymph drainage;Parrafin;Ultrasound;Therapeutic activities;Canalith Repostioning;Moist Heat;Traction;Biofeedback;Cryotherapy;Fluidtherapy;Electrical Stimulation;Contrast Bath;Therapeutic exercise;Orthotic Fit/Training;Compression bandaging;Splinting;Taping;Vasopneumatic Device;Joint Manipulations;Spinal Manipulations;Visual/perceptual remediation/compensation;Vestibular    PT Next Visit Plan stretches to  gastroc and quad (focus on quad as patient has JAS brace), quad strengthening    PT Home Exercise Plan f/u with MD about order for new/different orthotic    Consulted and Agree with Plan of Care Patient              Patient will benefit from skilled therapeutic intervention in order to improve the following deficits and impairments:  Pain, Improper body mechanics, Abnormal gait, Decreased balance, Decreased activity tolerance, Decreased range of motion, Decreased strength, Hypomobility, Decreased mobility, Impaired sensation, Difficulty walking  Visit Diagnosis: Other lack of coordination  Muscle weakness (generalized)  Difficulty in walking, not elsewhere classified     Problem List Patient Active Problem List   Diagnosis Date Noted   Thrombocytopenia, unspecified ([place]) 11/19/2020   Pain in joint of right foot 04/15/2020   Right spastic hemiparesis ([place]) 03/04/2020   Chronic otitis externa of right ear 02/20/2020   Intraparenchymal hemorrhage of brain ([place])    Delirium    ICH (intracerebral hemorrhage) ([place]) - L frontoparietal, hypertensive 01/26/2020   Stroke (cerebrum) ([place]) 01/26/2020   Conductive hearing loss of right ear with unrestricted hearing of left ear 05/28/2019   Hyponatremia 08/31/2017   Generalized weakness 08/31/2017   Dehydration 08/31/2017   UTI (urinary tract infection) 08/31/2017   Tympanic membrane rupture, right 12/01/2016   Right ovarian cyst 08/22/2016   Fibromyalgia 06/03/2015   Cystocele 03/18/2014   Tick-borne disease 12/25/2013   Diverticulosis of colon without hemorrhage 06/12/2013   Hypertension    Osteopenia    Chronic low back pain 05/12/2011   HYPERCHOLESTEROLEMIA 03/17/2008   Anxiety state 09/17/2007   GERD 09/17/2007   DJD (degenerative joint disease) 09/17/2007    7:13 AM, 01/27/21 Jerene Pitch, DPT Physical Therapy with [facility]  [phone] office   [place] [address] [place], [place], [address] Phone: [phone]   Fax:  [phone]  Name: Alison Cuevas MRN: [id number] Date of Birth: [birth year]

## 2021-01-27 ENCOUNTER — Encounter (HOSPITAL_COMMUNITY): Payer: Self-pay | Admitting: Physical Therapy

## 2021-01-27 ENCOUNTER — Ambulatory Visit: Payer: Medicare Other | Admitting: Family Medicine

## 2021-01-27 NOTE — Addendum Note (Signed)
Addended by: Jerene Pitch R on: 01/27/2021 07:34 AM   Modules accepted: Orders

## 2021-02-02 ENCOUNTER — Other Ambulatory Visit: Payer: Self-pay

## 2021-02-02 ENCOUNTER — Ambulatory Visit (HOSPITAL_COMMUNITY): Payer: Medicare Other

## 2021-02-02 DIAGNOSIS — R262 Difficulty in walking, not elsewhere classified: Secondary | ICD-10-CM

## 2021-02-02 DIAGNOSIS — M6281 Muscle weakness (generalized): Secondary | ICD-10-CM

## 2021-02-02 DIAGNOSIS — R278 Other lack of coordination: Secondary | ICD-10-CM

## 2021-02-02 DIAGNOSIS — R29898 Other symptoms and signs involving the musculoskeletal system: Secondary | ICD-10-CM

## 2021-02-02 DIAGNOSIS — R2689 Other abnormalities of gait and mobility: Secondary | ICD-10-CM

## 2021-02-02 NOTE — Therapy (Signed)
[place], [place], [address] Phone: [phone]   Fax:  [phone]  Physical Therapy Treatment  Patient Details  Name: Alison Cuevas MRN: [id number] Date of Birth: Jun 02, 1946 Referring Provider (PT): Alger Simons MD   Encounter Date: 02/02/2021   PT End of Session - 02/02/21 1450     Visit Number 2    Number of Visits 6    Date for PT Re-Evaluation 03/09/21    Authorization Type Medicare A/ [place] NAP 2ndary    Progress Note Due on Visit 10    PT Start Time 1345    PT Stop Time 1430    PT Time Calculation (min) 45 min    Equipment Utilized During Treatment Gait belt    Activity Tolerance Patient tolerated treatment well             Past Medical History:  Diagnosis Date   Allergic rhinitis    maple pollen   Anxiety    Arthritis    Bowel obstruction ([facility])    Chronic low back pain    Dr. Trenton Gammon.  Has had back injection--bp elevated after.   Chronic pain syndrome    Chronic renal insufficiency, stage 2 (mild)    GFR 60-70   DDD (degenerative disc disease), cervical    Hx of ACDF (Dr. [name]).  Followed by Dr. [name].  Also, Dr. [name] do left C7-T1 intralaminal epidural injection.   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  Resid R arm and leg weakness + flexion contractures.   Hypercholesteremia    mild; pt declined statin trial 05/2014--needs recheck lipid panel at first f/u visit in 2017   Hypertension    Osteopenia 06/2017   T score -1.4 FRAX 15% / 2%   Ovarian cyst 2017   82019-robotic assisted bilatel SPO: all path benign.   Perianal dermatitis    prn cutivate   Phlebitis    Right hemiplegia ([place]) 01/2020   R arm and leg (hemorrhagic L cerebral  hemisphere CVA)   Right knee injury 2018   Patellofemoral crush injury--Dr. [name].   Stroke [facility])    Trochanteric bursitis of right hip    Recurrent (injection by Dr. 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 [name]). Recurrent fungal/bact OE.   Varicose vein    left leg     Past Surgical History:  Procedure Laterality Date   ABDOMINAL HYSTERECTOMY  03/2015   TAH.  Last pap 2016.  No hx of abnl paps.  Per GYN, no further pap smears indicated.   ANTERIOR AND POSTERIOR REPAIR N/A 03/18/2014   Procedure: Cystocele repair with graft, Vault suspension, Rectocele repair;  Surgeon: [name], MD;  [facility];  Service: Urology;  Laterality: N/A;   CHOLECYSTECTOMY     CHOLECYSTECTOMY, LAPAROSCOPIC     COLON SURGERY     COLONOSCOPY  04/2006; 05/26/16   2007 (Dr. [name]): Normal.  04/2016 (Dr. [name]) normal except diverticulosis and decreased anal sphincter tone.  No repeat colonoscopy is recommended due to age.   cspine surgery     Dr. Wiliam Ke level ant cerv discectomy [name] w/plating   CYSTO N/A 03/18/2014  Procedure: CYSTO;  Surgeon: [name], MD;  [facility];  Service: Urology;  Laterality: N/A;   DEXA  07/30/2015; 06/2018   2017 and 2020 -->Osteopenia--repeat 2 yrs.   LYSIS OF ADHESION N/A 01/31/2018   Procedure: POSSIBLE LYSIS OF ADHESION;  Surgeon: Everitt Amber, MD;  [facility];  Service: Gynecology;  Laterality: N/A;   [place]     with prolasped bledder repair   RESECTION OF COLON     BENIGN TUMOR   RIGHT EAR SURGERY  08/23/2017   Dr. 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  Procedure: XI ROBOTIC ASSISTED BILATERAL SALPINGO OOPHORECTOMY;  Surgeon: Everitt Amber, MD;  [facility];  Service: Gynecology;  Laterality: Bilateral;   TRANSTHORACIC ECHOCARDIOGRAM  01/27/2020   EF 65-70%, NORMAL.   TUBAL LIGATION      There were no vitals filed for this visit.   Subjective Assessment - 02/02/21 1534     Subjective Pt feels very discouraged by her walking difficulties and the problem of controlling her right ankle    Pertinent History Got injection in quads and PF on 01/13/21. States that her leg has progressively turned out more and more and now she is having pain along the outside of her foot. States that she has been using her jas brace 2x/day for 30 minutes in each position. States that she is still using it but feels her foot has started to turn out. States that she has a lot of pain along the 5th met. States that it hurts real bad. States she is frustrated and doesn't know what to do.    Limitations Standing;Walking;House hold activities    Patient Stated Goals to walk better/ with less pain                [facility] PT Assessment - 02/02/21 0001       Assessment   Medical Diagnosis Right side weakness S/P CVA    Referring Provider (PT) Alger Simons MD                           [facility] Adult PT Treatment/Exercise - 02/02/21 0001       Ambulation/Gait   Ambulation/Gait Yes    Ambulation/Gait Assistance 4: Min assist    Ambulation/Gait Assistance Details performed in 4 trials of 5.5-6.5 min without AFO RLE and therapist performing RLE kinematics and verbal cues for pt to perform "Big steps" for LLE to facilitate stride/step length symmetry    Ambulation Distance (Feet) 135 Feet    Assistive device Body weight support  system    Gait Pattern Decreased stride length;Decreased dorsiflexion - right;Step-to pattern    Curb 4: Min assist                    PT Education - 02/02/21 1535     Education Details education on use of Jas brace with RLE placed in extension to enhance stretch to gastroc. Discussion with patient and her son regarding her right ankle spasticity  and the potential benefits of having a more supportive AFO for right ankle to control her tendency to plantarflex/invert whilst RLE is in swing phase with lateral loading at initial contact as a result.  Pt currently has a carbon-fiber AFO with anterior shin support and lateral strut that helps in foot clearance, but her spasticity is so pronounced that her foot deviates inside of her shoe/brace and causes an impingement of her ankle against the support causing pain/irritation.    Person(s) Educated Patient;Child(ren)    Methods Explanation    Comprehension Verbalized understanding              PT Short Term Goals - 01/26/21 1554       PT SHORT TERM GOAL #1   Title Patient will be independent with initial HEP and self-management strategies to improve functional outcomes    Time 3    Period Weeks    Status New    Target Date 02/16/21      PT SHORT TERM GOAL #2   Title Patient will report at least 25% improvement in overall symptoms and/or function to demonstrate improved functional mobility    Time 3    Period Weeks    Status New    Target Date 02/16/21               PT Long Term Goals - 01/26/21 1556       PT LONG TERM GOAL #1   Title Patient will report at least 50% improvement in overall symptoms and/or function to demonstrate improved functional mobility    Time 6    Period Weeks    Status New    Target Date 03/09/21      PT LONG TERM GOAL #2   Title Patient will be indpendent in advanced HEP to improve functional outcomes.    Time 6    Period Weeks    Status New    Target Date 03/09/21      PT LONG  TERM GOAL #3   Title Patient will report making apt with orthotic place to get new orthotic for right LE    Time 6    Period Weeks    Status New    Target Date 03/09/21                   Plan - 02/02/21 1548     Clinical Impression Statement Good response with gait training via body-weight support with therapist providing RLE mechanics to facilitate heel strike, terminal stance, pre-swing with emphasis on pt performing "big steps" with LLE in order to improve step length symmetry.  Noticeable decrease in synergistic patterns in RLE once she was able to coordinate ability to increase step length and this minmized her right ankle spasticity in a very profound way.  Once back on RW continued with cues for LLE step length and again demonstrated some improvement in RLE synergistic patterns. Very pronounced right ankle spasticity and comepnsatory hip flexion/external rotation present. Continued sessions indicated to improve gait training, coordination, and facilitate a different AFO intervention if medically indicated. This therapist will contact presiding MD for review and consideration.    Personal Factors and Comorbidities Age;Comorbidity 1    Comorbidities CVA    Examination-Activity Limitations Locomotion Level;Bed Mobility;Stand;Stairs;Transfers    Examination-Participation Restrictions Community Activity;Yard Work;Laundry;Driving;Cleaning    Stability/Clinical Decision Making Stable/Uncomplicated    Rehab Potential Good    PT Frequency 1x / week    PT Duration 6 weeks  PT Treatment/Interventions ADLs/Self Care Home Management;Aquatic Therapy;Iontophoresis 57m/ml Dexamethasone;DME Instruction;Balance training;Passive range of motion;Scar mobilization;Neuromuscular re-education;Gait training;Stair training;Functional mobility training;Patient/family education;Manual techniques;Dry needling;Energy conservation;Manual lymph drainage;Parrafin;Ultrasound;Therapeutic activities;Canalith  Repostioning;Moist Heat;Traction;Biofeedback;Cryotherapy;Fluidtherapy;Electrical Stimulation;Contrast Bath;Therapeutic exercise;Orthotic Fit/Training;Compression bandaging;Splinting;Taping;Vasopneumatic Device;Joint Manipulations;Spinal Manipulations;Visual/perceptual remediation/compensation;Vestibular    PT Next Visit Plan stretches to gastroc and quad (focus on quad as patient has JAS brace), quad strengthening    PT Home Exercise Plan f/u with MD about order for new/different orthotic    Consulted and Agree with Plan of Care Patient             Patient will benefit from skilled therapeutic intervention in order to improve the following deficits and impairments:  Pain, Improper body mechanics, Abnormal gait, Decreased balance, Decreased activity tolerance, Decreased range of motion, Decreased strength, Hypomobility, Decreased mobility, Impaired sensation, Difficulty walking  Visit Diagnosis: Other lack of coordination  Muscle weakness (generalized)  Difficulty in walking, not elsewhere classified  Other symptoms and signs involving the musculoskeletal system  Other abnormalities of gait and mobility     Problem List Patient Active Problem List   Diagnosis Date Noted   Thrombocytopenia, unspecified ([place] 11/19/2020   Pain in joint of right foot 04/15/2020   Right spastic hemiparesis ([place] 03/04/2020   Chronic otitis externa of right ear 02/20/2020   Intraparenchymal hemorrhage of brain ([place]    Delirium    ICH (intracerebral hemorrhage) ([place] - L frontoparietal, hypertensive 01/26/2020   Stroke (cerebrum) ([place] 01/26/2020   Conductive hearing loss of right ear with unrestricted hearing of left ear 05/28/2019   Hyponatremia 08/31/2017   Generalized weakness 08/31/2017   Dehydration 08/31/2017   UTI (urinary tract infection) 08/31/2017   Tympanic membrane rupture, right 12/01/2016   Right ovarian cyst 08/22/2016   Fibromyalgia 06/03/2015   Cystocele 03/18/2014    Tick-borne disease 12/25/2013   Diverticulosis of colon without hemorrhage 06/12/2013   Hypertension    Osteopenia    Chronic low back pain 05/12/2011   HYPERCHOLESTEROLEMIA 03/17/2008   Anxiety state 09/17/2007   GERD 09/17/2007   DJD (degenerative joint disease) 09/17/2007   3:53 PM, 02/02/21 M. KSherlyn Lees PT, DPT Physical Therapist- [place] Office Number: [phone]  [place] [address]Phone: [phone]  Fax:  [phone] Name: RArvilla SaladaMRN: [id number] of Birth: 4December 19, 1947

## 2021-02-10 DIAGNOSIS — H52223 Regular astigmatism, bilateral: Secondary | ICD-10-CM | POA: Diagnosis not present

## 2021-02-10 DIAGNOSIS — H524 Presbyopia: Secondary | ICD-10-CM | POA: Diagnosis not present

## 2021-02-10 DIAGNOSIS — H2513 Age-related nuclear cataract, bilateral: Secondary | ICD-10-CM | POA: Diagnosis not present

## 2021-02-11 ENCOUNTER — Other Ambulatory Visit: Payer: Self-pay | Admitting: Family Medicine

## 2021-02-11 ENCOUNTER — Encounter (HOSPITAL_COMMUNITY): Payer: Self-pay | Admitting: Physical Therapy

## 2021-02-11 ENCOUNTER — Other Ambulatory Visit: Payer: Self-pay

## 2021-02-11 ENCOUNTER — Ambulatory Visit (HOSPITAL_COMMUNITY): Payer: Medicare Other | Admitting: Physical Therapy

## 2021-02-11 DIAGNOSIS — R278 Other lack of coordination: Secondary | ICD-10-CM | POA: Diagnosis not present

## 2021-02-11 DIAGNOSIS — R29898 Other symptoms and signs involving the musculoskeletal system: Secondary | ICD-10-CM

## 2021-02-11 DIAGNOSIS — R2689 Other abnormalities of gait and mobility: Secondary | ICD-10-CM

## 2021-02-11 DIAGNOSIS — R262 Difficulty in walking, not elsewhere classified: Secondary | ICD-10-CM

## 2021-02-11 DIAGNOSIS — M6281 Muscle weakness (generalized): Secondary | ICD-10-CM

## 2021-02-11 NOTE — Therapy (Signed)
[place], [place], [address] Phone: [phone]   Fax:  [phone]  Physical Therapy Treatment  Patient Details  Name: Alison Cuevas MRN: [id number] Date of Birth: Aug 11, 1945 Referring Provider (PT): Alger Simons MD   Encounter Date: 02/11/2021   PT End of Session - 02/11/21 1315     Visit Number 3    Number of Visits 6    Date for PT Re-Evaluation 03/09/21    Authorization Type Medicare A/ [place] NAP 2ndary    Progress Note Due on Visit 10    PT Start Time 1306    PT Stop Time 1346    PT Time Calculation (min) 40 min    Equipment Utilized During Treatment Gait belt    Activity Tolerance Patient tolerated treatment well             Past Medical History:  Diagnosis Date   Allergic rhinitis    maple pollen   Anxiety    Arthritis    Bowel obstruction (HCC)    Chronic low back pain    Dr. Trenton Gammon.  Has had back injection--bp elevated after.   Chronic pain syndrome    Chronic renal insufficiency, stage 2 (mild)    GFR 60-70   DDD (degenerative disc disease), cervical    Hx of ACDF (Dr. [name]).  Followed by Dr. [name].  Also, Dr. [name] do left C7-T1 intralaminal epidural injection.   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  Resid R arm and leg weakness + flexion contractures.   Hypercholesteremia    mild; pt declined statin trial 05/2014--needs recheck lipid panel at first f/u visit in 2017   Hypertension    Osteopenia 06/2017   T score -1.4 FRAX 15% / 2%   Ovarian cyst 2017   82019-robotic assisted bilatel SPO: all path benign.   Perianal dermatitis    prn cutivate   Phlebitis    Right hemiplegia ([place]) 01/2020   R arm and leg (hemorrhagic L cerebral  hemisphere CVA)   Right knee injury 2018   Patellofemoral crush injury--Dr. [name].   Stroke [facility])    Trochanteric bursitis of right hip    Recurrent (injection by Dr. 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 [name]). Recurrent fungal/bact OE.   Varicose vein    left leg     Past Surgical History:  Procedure Laterality Date   ABDOMINAL HYSTERECTOMY  03/2015   TAH.  Last pap 2016.  No hx of abnl paps.  Per GYN, no further pap smears indicated.   ANTERIOR AND POSTERIOR REPAIR N/A 03/18/2014   Procedure: Cystocele repair with graft, Vault suspension, Rectocele repair;  Surgeon: [name], MD;  [facility];  Service: Urology;  Laterality: N/A;   CHOLECYSTECTOMY     CHOLECYSTECTOMY, LAPAROSCOPIC     COLON SURGERY     COLONOSCOPY  04/2006; 05/26/16   2007 (Dr. [name]): Normal.  04/2016 (Dr. [name]) normal except diverticulosis and decreased anal sphincter tone.  No repeat colonoscopy is recommended due to age.   cspine surgery     Dr. Wiliam Ke level ant cerv discectomy [name] w/plating   CYSTO N/A 03/18/2014  Procedure: CYSTO;  Surgeon: [name], MD;  [facility];  Service: Urology;  Laterality: N/A;   DEXA  07/30/2015; 06/2018   2017 and 2020 -->Osteopenia--repeat 2 yrs.   LYSIS OF ADHESION N/A 01/31/2018   Procedure: POSSIBLE LYSIS OF ADHESION;  Surgeon: Everitt Amber, MD;  [facility];  Service: Gynecology;  Laterality: N/A;   [place]     with prolasped bledder repair   RESECTION OF COLON     BENIGN TUMOR   RIGHT EAR SURGERY  08/23/2017   Dr. 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  Procedure: XI ROBOTIC ASSISTED BILATERAL SALPINGO OOPHORECTOMY;  Surgeon: Everitt Amber, MD;  [facility];  Service: Gynecology;  Laterality: Bilateral;   TRANSTHORACIC ECHOCARDIOGRAM  01/27/2020   EF 65-70%, NORMAL.   TUBAL LIGATION      There were no vitals filed for this visit.   Subjective Assessment - 02/11/21 1313     Subjective Reports ongoing problems with RT foot position.    Pertinent History Got injection in quads and PF on 01/13/21. States that her leg has progressively turned out more and more and now she is having pain along the outside of her foot. States that she has been using her jas brace 2x/day for 30 minutes in each position. States that she is still using it but feels her foot has started to turn out. States that she has a lot of pain along the 5th met. States that it hurts real bad. States she is frustrated and doesn't know what to do.    Limitations Standing;Walking;House hold activities    Patient Stated Goals to walk better/ with less pain    Currently in Pain? Yes    Pain Score 7     Pain Location Foot    Pain Orientation Right    Pain Descriptors / Indicators --   like needles going through it                              [facility] Adult PT Treatment/Exercise - 02/11/21 0001       Knee/Hip Exercises: Standing   Gait Training 100 feet with RW    Other Standing Knee Exercises sidestepping in // bars 2RT, standing hip abduction 2 x 10 cues for RLE straight, forward and lateral cone taps with RLE for coordination, fwd weight shifting with cues for RLE heel down to improve heel/toe walking      Manual Therapy   Manual Therapy Passive ROM    Manual therapy comments completed separately from therapeutic exercises    Passive  ROM RT knee flexion and hip extension PROM in LT sidelying for improved RT hip and knee ROM                      PT Short Term Goals - 01/26/21 1554       PT SHORT TERM GOAL #1   Title Patient will be independent with initial HEP and self-management strategies to  improve functional outcomes    Time 3    Period Weeks    Status New    Target Date 02/16/21      PT SHORT TERM GOAL #2   Title Patient will report at least 25% improvement in overall symptoms and/or function to demonstrate improved functional mobility    Time 3    Period Weeks    Status New    Target Date 02/16/21               PT Long Term Goals - 01/26/21 1556       PT LONG TERM GOAL #1   Title Patient will report at least 50% improvement in overall symptoms and/or function to demonstrate improved functional mobility    Time 6    Period Weeks    Status New    Target Date 03/09/21      PT LONG TERM GOAL #2   Title Patient will be indpendent in advanced HEP to improve functional outcomes.    Time 6    Period Weeks    Status New    Target Date 03/09/21      PT LONG TERM GOAL #3   Title Patient will report making apt with orthotic place to get new orthotic for right LE    Time 6    Period Weeks    Status New    Target Date 03/09/21                   Plan - 02/11/21 1348     Clinical Impression Statement Patient continues to have difficulty with RLE coordination impacting gait. Added cone taps for improved coordination and targeting. Patient cued to avoid foot external rotation during sidestepping. Added manual stretching to address RLE restrictions in hip and quad. Will continue to progress activity as able.    Personal Factors and Comorbidities Age;Comorbidity 1    Comorbidities CVA    Examination-Activity Limitations Locomotion Level;Bed Mobility;Stand;Stairs;Transfers    Examination-Participation Restrictions Community Activity;Yard Work;Laundry;Driving;Cleaning     Stability/Clinical Decision Making Stable/Uncomplicated    Rehab Potential Good    PT Frequency 1x / week    PT Duration 6 weeks    PT Treatment/Interventions ADLs/Self Care Home Management;Aquatic Therapy;Iontophoresis 7m/ml Dexamethasone;DME Instruction;Balance training;Passive range of motion;Scar mobilization;Neuromuscular re-education;Gait training;Stair training;Functional mobility training;Patient/family education;Manual techniques;Dry needling;Energy conservation;Manual lymph drainage;Parrafin;Ultrasound;Therapeutic activities;Canalith Repostioning;Moist Heat;Traction;Biofeedback;Cryotherapy;Fluidtherapy;Electrical Stimulation;Contrast Bath;Therapeutic exercise;Orthotic Fit/Training;Compression bandaging;Splinting;Taping;Vasopneumatic Device;Joint Manipulations;Spinal Manipulations;Visual/perceptual remediation/compensation;Vestibular    PT Next Visit Plan stretches to gastroc and quad (focus on quad as patient has JAS brace), quad strengthening    PT Home Exercise Plan f/u with MD about order for new/different orthotic 8/18 weight shifts with focus on keeping heel down    Consulted and Agree with Plan of Care Patient             Patient will benefit from skilled therapeutic intervention in order to improve the following deficits and impairments:  Pain, Improper body mechanics, Abnormal gait, Decreased balance, Decreased activity tolerance, Decreased range of motion, Decreased strength, Hypomobility, Decreased mobility, Impaired sensation, Difficulty walking  Visit Diagnosis: Other lack of coordination  Muscle weakness (generalized)  Difficulty in walking, not elsewhere classified  Other symptoms and signs involving the musculoskeletal system  Other abnormalities of gait and mobility     Problem List Patient Active Problem List   Diagnosis Date Noted   [place], [place] 11/19/2020   Pain in joint of right foot 04/15/2020   Right spastic hemiparesis ([place]  03/04/2020  Chronic otitis externa of right ear 02/20/2020   Intraparenchymal hemorrhage of brain ([place])    Delirium    ICH (intracerebral hemorrhage) ([place]) - L frontoparietal, hypertensive 01/26/2020   Stroke (cerebrum) ([place]) 01/26/2020   Conductive hearing loss of right ear with unrestricted hearing of left ear 05/28/2019   Hyponatremia 08/31/2017   Generalized weakness 08/31/2017   Dehydration 08/31/2017   UTI (urinary tract infection) 08/31/2017   Tympanic membrane rupture, right 12/01/2016   Right ovarian cyst 08/22/2016   Fibromyalgia 06/03/2015   Cystocele 03/18/2014   Tick-borne disease 12/25/2013   Diverticulosis of colon without hemorrhage 06/12/2013   Hypertension    Osteopenia    Chronic low back pain 05/12/2011   HYPERCHOLESTEROLEMIA 03/17/2008   Anxiety state 09/17/2007   GERD 09/17/2007   DJD (degenerative joint disease) 09/17/2007   1:50 PM, 02/11/21 Josue Hector PT DPT  Physical Therapist with [facility]  [phone] [place], [place], [address] Phone: [phone]   Fax:  [phone]  Name: Alison Cuevas MRN: [id number] Date of Birth: [birth year]

## 2021-02-11 NOTE — Telephone Encounter (Signed)
Patient requesting refill of hydrocodone. Please send to same [facility] in [place].

## 2021-02-12 MED ORDER — HYDROCODONE-ACETAMINOPHEN 5-325 MG PO TABS: ORAL_TABLET | ORAL | 0 refills | Status: DC

## 2021-02-12 NOTE — Telephone Encounter (Signed)
LM for pt to return call regarding refill.

## 2021-02-12 NOTE — Telephone Encounter (Signed)
Requesting: Hydrocodone Contract:  03/06/19 UDS: 04/03/19 Last Visit: Next Visit:02/17/21 Last Refill: 01/04/21(15,0)  Please review and advise. Med pending

## 2021-02-15 NOTE — Telephone Encounter (Signed)
PA sent via covermymed on 02/15/21   Key:  [id number]   Medication: Hydrocodone   Dx: Chronic Pain Syndrome, G89.4   Per Dr. Anitra Lauth pt has tried and failed    Waiting for response.   PA approved from 02/15/2021 to 08/14/2021. When this approval expires, please speak to your doctor about your treatment.

## 2021-02-16 ENCOUNTER — Encounter (HOSPITAL_COMMUNITY): Payer: Self-pay | Admitting: Physical Therapy

## 2021-02-16 ENCOUNTER — Ambulatory Visit (HOSPITAL_COMMUNITY): Payer: Medicare Other | Admitting: Physical Therapy

## 2021-02-16 ENCOUNTER — Other Ambulatory Visit: Payer: Self-pay

## 2021-02-16 ENCOUNTER — Ambulatory Visit: Payer: Medicare Other | Admitting: Family Medicine

## 2021-02-16 DIAGNOSIS — R278 Other lack of coordination: Secondary | ICD-10-CM | POA: Diagnosis not present

## 2021-02-16 DIAGNOSIS — R29898 Other symptoms and signs involving the musculoskeletal system: Secondary | ICD-10-CM

## 2021-02-16 DIAGNOSIS — M6281 Muscle weakness (generalized): Secondary | ICD-10-CM

## 2021-02-16 DIAGNOSIS — R262 Difficulty in walking, not elsewhere classified: Secondary | ICD-10-CM

## 2021-02-16 NOTE — Therapy (Signed)
Alison Cuevas, [place], [address] Phone: [phone]   Fax:  [phone]  Physical Therapy Treatment  Patient Details  Name: Alison Cuevas MRN: [id number] Date of Birth: 21-Oct-1945 Referring Provider (PT): Alger Simons MD   Encounter Date: 02/16/2021   PT End of Session - 02/16/21 1335     Visit Number 4    Number of Visits 6    Date for PT Re-Evaluation 03/09/21    Authorization Type Medicare A/ [place] NAP 2ndary    Progress Note Due on Visit 10    PT Start Time 1318    PT Stop Time 1356    PT Time Calculation (min) 38 min    Equipment Utilized During Treatment Gait belt    Activity Tolerance Patient tolerated treatment well             Past Medical History:  Diagnosis Date   Allergic rhinitis    maple pollen   Anxiety    Arthritis    Bowel obstruction (HCC)    Chronic low back pain    Dr. Trenton Gammon.  Has had back injection--bp elevated after.   Chronic pain syndrome    Chronic renal insufficiency, stage 2 (mild)    GFR 60-70   DDD (degenerative disc disease), cervical    Hx of ACDF (Dr. [name]).  Followed by Dr. [name].  Also, Dr. [name] do left C7-T1 intralaminal epidural injection.   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  Resid R arm and leg weakness + flexion contractures.   Hypercholesteremia    mild; pt declined statin trial 05/2014--needs recheck lipid panel at first f/u visit in 2017   Hypertension    Osteopenia 06/2017   T score -1.4 FRAX 15% / 2%   Ovarian cyst 2017   82019-robotic assisted bilatel SPO: all path benign.   Perianal dermatitis    prn cutivate   Phlebitis    Right hemiplegia ([place]) 01/2020   R arm and leg (hemorrhagic L cerebral  hemisphere CVA)   Right knee injury 2018   Patellofemoral crush injury--Dr. [name].   Stroke [facility])    Trochanteric bursitis of right hip    Recurrent (injection by Dr. 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 [name]). Recurrent fungal/bact OE.   Varicose vein    left leg     Past Surgical History:  Procedure Laterality Date   ABDOMINAL HYSTERECTOMY  03/2015   TAH.  Last pap 2016.  No hx of abnl paps.  Per GYN, no further pap smears indicated.   ANTERIOR AND POSTERIOR REPAIR N/A 03/18/2014   Procedure: Cystocele repair with graft, Vault suspension, Rectocele repair;  Surgeon: [name], MD;  [facility];  Service: Urology;  Laterality: N/A;   CHOLECYSTECTOMY     CHOLECYSTECTOMY, LAPAROSCOPIC     COLON SURGERY     COLONOSCOPY  04/2006; 05/26/16   2007 (Dr. [name]): Normal.  04/2016 (Dr. [name]) normal except diverticulosis and decreased anal sphincter tone.  No repeat colonoscopy is recommended due to age.   cspine surgery     Dr. Wiliam Ke level ant cerv discectomy [name] w/plating   CYSTO N/A 03/18/2014  Procedure: CYSTO;  Surgeon: [name], MD;  [facility];  Service: Urology;  Laterality: N/A;   DEXA  07/30/2015; 06/2018   2017 and 2020 -->Osteopenia--repeat 2 yrs.   LYSIS OF ADHESION N/A 01/31/2018   Procedure: POSSIBLE LYSIS OF ADHESION;  Surgeon: Everitt Amber, MD;  [facility];  Service: Gynecology;  Laterality: N/A;   [place]     with prolasped bledder repair   RESECTION OF COLON     BENIGN TUMOR   RIGHT EAR SURGERY  08/23/2017   Dr. 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  Procedure: XI ROBOTIC ASSISTED BILATERAL SALPINGO OOPHORECTOMY;  Surgeon: Everitt Amber, MD;  [facility];  Service: Gynecology;  Laterality: Bilateral;   TRANSTHORACIC ECHOCARDIOGRAM  01/27/2020   EF 65-70%, NORMAL.   TUBAL LIGATION      There were no vitals filed for this visit.   Subjective Assessment - 02/16/21 1335     Subjective States that her foot isn't cooperating.    Pertinent History Got injection in quads and PF on 01/13/21. States that her leg has progressively turned out more and more and now she is having pain along the outside of her foot. States that she has been using her jas brace 2x/day for 30 minutes in each position. States that she is still using it but feels her foot has started to turn out. States that she has a lot of pain along the 5th met. States that it hurts real bad. States she is frustrated and doesn't know what to do.    Limitations Standing;Walking;House hold activities    Patient Stated Goals to walk better/ with less pain                [facility] PT Assessment - 02/16/21 0001       Assessment   Medical Diagnosis Right side weakness S/P CVA    Referring Provider (PT) Alger Simons MD                           [facility] Adult PT Treatment/Exercise - 02/16/21 0001       Knee/Hip Exercises: Stretches   Active Hamstring Stretch Right;5 reps;30 seconds   focus on keeping leg from rotating out.   Gastroc Stretch Right   PT performed 2 minutes total     Knee/Hip Exercises: Seated   Other Seated Knee/Hip Exercises toe  flexion adn extension stretch PROM - patient performed - 2 minutes total                    PT Education - 02/16/21 1342     Education Details on realistic expectations, on plan moving  forward. on exercises and current foot/leg presentation, non self stretches. On devices to use/add onto RW or rollator to improve overall function.Marland Kitchen    Person(s) Educated Patient;Child(ren)    Methods Explanation    Comprehension Verbalized understanding              PT Short Term Goals - 01/26/21  [place] #1   Title Patient will be independent with initial HEP and self-management strategies to improve functional outcomes    Time 3    Period Weeks    Status New    Target Date 02/16/21      PT SHORT TERM GOAL #2   Title Patient will report at least 25% improvement in overall symptoms and/or function to demonstrate improved functional mobility    Time 3    Period Weeks    Status New    Target Date 02/16/21               PT Long Term Goals - 01/26/21 1556       PT LONG TERM GOAL #1   Title Patient will report at least 50% improvement in overall symptoms and/or function to demonstrate improved functional mobility    Time 6    Period Weeks    Status New    Target Date 03/09/21      PT LONG TERM GOAL #2   Title Patient will be indpendent in advanced HEP to improve functional outcomes.    Time 6    Period Weeks    Status New    Target Date 03/09/21      PT LONG TERM GOAL #3   Title Patient will report making apt with orthotic place to get new orthotic for right LE    Time 6    Period Weeks    Status New    Target Date 03/09/21                   Plan - 02/16/21 1416     Clinical Impression Statement Session focused on education secondary to patient with continued frustration with current presentation. Patient with desires to get back to where she was prior to her stroke. Educated patient and son about realistic progress at this current point in time. Answered all questions and discussed trying to use devices to assist with continuing to be functional at home. Anticipate next session to be patients last session.    Personal Factors  and Comorbidities Age;Comorbidity 1    Comorbidities CVA    Examination-Activity Limitations Locomotion Level;Bed Mobility;Stand;Stairs;Transfers    Examination-Participation Restrictions Community Activity;Yard Work;Laundry;Driving;Cleaning    Stability/Clinical Decision Making Stable/Uncomplicated    Rehab Potential Good    PT Frequency 1x / week    PT Duration 6 weeks    PT Treatment/Interventions ADLs/Self Care Home Management;Aquatic Therapy;Iontophoresis 42m/ml Dexamethasone;DME Instruction;Balance training;Passive range of motion;Scar mobilization;Neuromuscular re-education;Gait training;Stair training;Functional mobility training;Patient/family education;Manual techniques;Dry needling;Energy conservation;Manual lymph drainage;Parrafin;Ultrasound;Therapeutic activities;Canalith Repostioning;Moist Heat;Traction;Biofeedback;Cryotherapy;Fluidtherapy;Electrical Stimulation;Contrast Bath;Therapeutic exercise;Orthotic Fit/Training;Compression bandaging;Splinting;Taping;Vasopneumatic Device;Joint Manipulations;Spinal Manipulations;Visual/perceptual remediation/compensation;Vestibular    PT Next Visit Plan go over HEP (patient to bring print offs) add additional exercises to HEP, anticipate next session to be last session.    PT Home Exercise Plan f/u with MD about order for new/different orthotic 8/18 weight shifts with focus on keeping heel down; hamstring stretch, toe/flexion PROM    Consulted and Agree with Plan of Care Patient             Patient will benefit from skilled therapeutic intervention in order to improve the following deficits and impairments:  Pain, Improper body mechanics, Abnormal gait, Decreased balance, Decreased activity tolerance, Decreased range of motion, Decreased strength, Hypomobility, Decreased mobility, Impaired sensation, Difficulty walking  Visit Diagnosis: Other lack of coordination  Muscle weakness (generalized)  Difficulty in walking, not elsewhere  classified  Other symptoms and signs involving the musculoskeletal system     Problem List Patient Active Problem List   Diagnosis Date Noted   Thrombocytopenia, unspecified ([place]) 11/19/2020   Pain in joint of right foot 04/15/2020   Right spastic hemiparesis ([place]) 03/04/2020   Chronic otitis externa of right ear 02/20/2020   Intraparenchymal hemorrhage of brain ([place])    Delirium    ICH (intracerebral hemorrhage) ([place]) - L frontoparietal, hypertensive 01/26/2020   Stroke (cerebrum) ([place]) 01/26/2020   Conductive hearing loss of right ear with unrestricted hearing of left ear 05/28/2019   Hyponatremia 08/31/2017   Generalized weakness 08/31/2017   Dehydration 08/31/2017   UTI (urinary tract infection) 08/31/2017   Tympanic membrane rupture, right 12/01/2016   Right ovarian cyst 08/22/2016   Fibromyalgia 06/03/2015   Cystocele 03/18/2014   Tick-borne disease 12/25/2013   Diverticulosis of colon without hemorrhage 06/12/2013   Hypertension    Osteopenia    Chronic low back pain 05/12/2011   HYPERCHOLESTEROLEMIA 03/17/2008   Anxiety state 09/17/2007   GERD 09/17/2007   DJD (degenerative joint disease) 09/17/2007    Eliezer Champagne 02/16/2021, 2:17 PM  [place] [address] [place], [place], [address] Phone: [phone]   Fax:  [phone]  Name: Tianne Plott MRN: [id number] Date of Birth: 24-Mar-1946

## 2021-02-17 ENCOUNTER — Encounter: Payer: Self-pay | Admitting: Family Medicine

## 2021-02-17 ENCOUNTER — Ambulatory Visit (INDEPENDENT_AMBULATORY_CARE_PROVIDER_SITE_OTHER): Payer: Medicare Other | Admitting: Family Medicine

## 2021-02-17 VITALS — BP 147/82 | HR 83 | Temp 97.7°F | Wt 112.4 lb

## 2021-02-17 DIAGNOSIS — M8580 Other specified disorders of bone density and structure, unspecified site: Secondary | ICD-10-CM | POA: Diagnosis not present

## 2021-02-17 DIAGNOSIS — G894 Chronic pain syndrome: Secondary | ICD-10-CM

## 2021-02-17 DIAGNOSIS — M545 Low back pain, unspecified: Secondary | ICD-10-CM

## 2021-02-17 DIAGNOSIS — H9201 Otalgia, right ear: Secondary | ICD-10-CM

## 2021-02-17 DIAGNOSIS — Z79899 Other long term (current) drug therapy: Secondary | ICD-10-CM

## 2021-02-17 DIAGNOSIS — R5381 Other malaise: Secondary | ICD-10-CM

## 2021-02-17 DIAGNOSIS — R5382 Chronic fatigue, unspecified: Secondary | ICD-10-CM | POA: Diagnosis not present

## 2021-02-17 DIAGNOSIS — M810 Age-related osteoporosis without current pathological fracture: Secondary | ICD-10-CM

## 2021-02-17 DIAGNOSIS — I6930 Unspecified sequelae of cerebral infarction: Secondary | ICD-10-CM | POA: Diagnosis not present

## 2021-02-17 DIAGNOSIS — F411 Generalized anxiety disorder: Secondary | ICD-10-CM

## 2021-02-17 DIAGNOSIS — I69959 Hemiplegia and hemiparesis following unspecified cerebrovascular disease affecting unspecified side: Secondary | ICD-10-CM

## 2021-02-17 DIAGNOSIS — I10 Essential (primary) hypertension: Secondary | ICD-10-CM | POA: Diagnosis not present

## 2021-02-17 DIAGNOSIS — E348 Other specified endocrine disorders: Secondary | ICD-10-CM | POA: Diagnosis not present

## 2021-02-17 DIAGNOSIS — G8929 Other chronic pain: Secondary | ICD-10-CM

## 2021-02-17 LAB — BASIC METABOLIC PANEL
BUN: 13 mg/dL (ref 6–23)
CO2: 25 mEq/L (ref 19–32)
Calcium: 9.4 mg/dL (ref 8.4–10.5)
Chloride: 97 mEq/L (ref 96–112)
Creatinine, Ser: 0.7 mg/dL (ref 0.40–1.20)
GFR: 84.03 mL/min (ref >60.00)
Glucose, Bld: 87 mg/dL (ref 70–99)
Potassium: 4.3 mEq/L (ref 3.5–5.1)
Sodium: 131 mEq/L — ABNORMAL LOW (ref 135–145)

## 2021-02-17 NOTE — Progress Notes (Signed)
OFFICE VISIT  02/17/2021  CC:  Chief Complaint  Patient presents with   Follow-up   HPI:    Patient is a 75 y.o. 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  Seeing Dr. Ephriam Knuckles for botox injections in R LE for her spastic hemiplegia. Wearing a L AFO that she says hurts her foot on side and top, esp when she ambulates. She is doing well with ambulating with walker.  Family stays with her most days but she has a couple days a week when she is at home w/out assistance and does ok with this.  No falls.  Takes 1/2 zanaflex bid.  TAking vicodin some but not at same time a zanaflex b/c zanaflex makes her tired.   Taking alprazolam whole tab qhs.  Occ daytime prn use.  Home bp: this morning [id number] systolic.  She doesn't recall any diastolics.  She has chronic LBP and chronic R ear pain d/t chronic TM rupture and subsequent surgeries-->for which she uses small amounts of vicodin.  PMP AWARE reviewed today: most recent rx for vicodin was filled 01/04/21, # 15, rx by me. Most recent rx for alprazolam was filled 11/15/20, #90, Rx by me. No red flags.   Past Medical History:  Diagnosis Date   Allergic rhinitis    maple pollen   Anxiety    Arthritis    Bowel obstruction (HCC)    Chronic low back pain    Dr. Trenton Gammon.  Has had back injection--bp elevated after.   Chronic pain syndrome    Chronic renal insufficiency, stage 2 (mild)    GFR 60-70   DDD (degenerative disc disease), cervical    Hx of ACDF (Dr. [name]).  Followed by Dr. [name].  Also, Dr. [name] do left C7-T1 intralaminal epidural injection.   Diverticulosis of colon    DJD (degenerative joint disease)    Gallstones    GERD (gastroesophageal reflux disease)    Herpes zoster 07/08/2014   History of CVA with residual deficit 01/2020   L frontoparietal hemorrhagic  CVA, R MCA/PCA watershed ischemic CVA.  Resid R arm and leg weakness + flexion contractures.   Hypercholesteremia    mild; pt declined statin trial 05/2014--needs recheck lipid panel at first f/u visit in 2017   Hypertension    Osteopenia 06/2017   T score -1.4 FRAX 15% / 2%   Ovarian cyst 2017   82019-robotic assisted bilatel SPO: all path benign.   Perianal dermatitis    prn cutivate   Phlebitis    Right hemiplegia ([place]) 01/2020   R arm and leg (hemorrhagic L cerebral hemisphere CVA)   Right knee injury 2018   Patellofemoral crush injury--Dr. [name].   Stroke [facility])    Trochanteric bursitis of right hip    Recurrent (injection by Dr. 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 [name]). Recurrent fungal/bact OE.   Varicose vein    left leg     Past Surgical History:  Procedure Laterality Date   ABDOMINAL HYSTERECTOMY  03/2015   TAH.  Last pap 2016.  No hx of abnl paps.  Per GYN, no further pap smears indicated.   ANTERIOR AND POSTERIOR REPAIR N/A 03/18/2014   Procedure: Cystocele repair with graft, Vault suspension, Rectocele repair;  Surgeon: [name], MD;  [facility]: [facility];  Service: Urology;  Laterality: N/A;   CHOLECYSTECTOMY     CHOLECYSTECTOMY, LAPAROSCOPIC     COLON SURGERY     COLONOSCOPY  04/2006; 05/26/16   2007 (Dr. [name]): Normal.  04/2016 (Dr. [name]) normal except diverticulosis and decreased anal sphincter tone.  No repeat colonoscopy is recommended due to age.   cspine surgery     Dr. Wiliam Ke level ant cerv discectomy [name] w/plating   CYSTO N/A 03/18/2014   Procedure: CYSTO;  Surgeon: [name], MD;  [facility];  Service: Urology;  Laterality: N/A;   DEXA  07/30/2015; 06/2018   2017 and 2020 -->Osteopenia--repeat 2 yrs.   LYSIS OF ADHESION N/A 01/31/2018   Procedure: POSSIBLE LYSIS OF ADHESION;  Surgeon: Everitt Amber, MD;  [facility];  Service:  Gynecology;  Laterality: N/A;   [place]     with prolasped bledder repair   RESECTION OF COLON     BENIGN TUMOR   RIGHT EAR SURGERY  08/23/2017   Dr. 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  Procedure: XI ROBOTIC ASSISTED BILATERAL SALPINGO OOPHORECTOMY;  Surgeon: Everitt Amber, MD;  [facility];  Service: Gynecology;  Laterality: Bilateral;   TRANSTHORACIC ECHOCARDIOGRAM  01/27/2020   EF 65-70%, NORMAL.   TUBAL LIGATION      Outpatient Medications Prior to Visit  Medication Sig Dispense Refill   acetaminophen (TYLENOL) 500 MG tablet Take 1 tablet (500 mg total) by mouth 3 (three) times daily. 30 tablet 0   ALPRAZolam (XANAX) 0.5 MG tablet TAKE 1 TABLET BY MOUTH THREE TIMES A DAY AS NEEDED FOR ANXIETY 90 tablet 5   aspirin EC 81 MG tablet Take 1 tablet (81 mg total) by mouth daily. Swallow whole. 30 tablet 11   Biotin 5 MG TABS Take 5 mg by mouth daily.     fluticasone (FLONASE) 50 MCG/ACT nasal spray Place 2 sprays into both nostrils at bedtime. 48 g 3   HYDROcodone-acetaminophen (NORCO/VICODIN) 5-325 MG tablet 1/2-1 tab po q6h prn pain 45 tablet 0   ibuprofen (ADVIL) 200 MG tablet Take 200 mg by mouth every 6 (six) hours as needed.     lisinopril (ZESTRIL) 5 MG tablet Take 1 tablet (5 mg total) by mouth daily. 90 tablet 3   loratadine (CLARITIN) 10 MG tablet Take 10 mg by mouth every other day.      meclizine (ANTIVERT) 12.5 MG tablet Take 1 tablet (12.5 mg total) by mouth 3 (three) times  daily. (Patient taking differently: Take 12.5 mg by mouth. Takes at night) 30 tablet 0   Multiple Minerals-Vitamins (CALCIUM-MAGNESIUM-ZINC-D3 PO) Take 1 tablet by mouth daily.      nystatin ointment (MYCOSTATIN) Apply 1 application topically 2 (two) times daily. Apply to affected area for up to 7 days. 30 g 0   pantoprazole (PROTONIX) 40 MG tablet Take 1 tablet (40 mg total) by mouth at bedtime. 90 tablet 3   polyethylene glycol (MIRALAX / GLYCOLAX) 17 g packet Take 17 g by mouth daily as needed.     potassium chloride SA (KLOR-CON)  20 MEQ tablet Take 1 tablet (20 mEq total) by mouth daily. 90 tablet 3   Sodium Fluoride (CLINPRO 5000) 1.1 % PSTE Clinpro 5000 1.1 % dental paste     tiZANidine (ZANAFLEX) 2 MG tablet TAKE 1 TABLET BY MOUTH 3 TIMES DAILY AS NEEDED FOR MUSCLE RELAXATION 270 tablet 0   triamcinolone ointment (KENALOG) 0.1 % triamcinolone acetonide 0.1 % topical ointment     verapamil (CALAN-SR) 180 MG CR tablet Take 1 tablet (180 mg total) by mouth daily. 90 tablet 3   No facility-administered medications prior to visit.    Allergies  Allergen Reactions   Gabapentin Anxiety    Elevated heart rate and crazy dreams   Prednisone Anxiety and Other (See Comments)    REACTION: funny feeling    ROS As per HPI  PE: Vitals with BMI 02/17/2021 01/13/2021 11/19/2020  Height - '5\' 0"'$  '5\' 0"'$   Weight 112 lbs 6 oz 111 lbs 10 oz 116 lbs  BMI - [id number]  Systolic Q000111Q Q000111Q [id number]  Diastolic 82 69 74  Pulse 83 89 83   Gen: Alert, well appearing.  Patient is oriented to person, place, time, and situation. AFFECT: pleasant, lucid thought and speech. R LL with AFO on, she doesn't have signif contracture in ankle/foot.   LABS:    Chemistry      Component Value Date/Time   NA 131 (L) 02/17/2021 1120   K 4.3 02/17/2021 1120   CL 97 02/17/2021 1120   CO2 25 02/17/2021 1120   BUN 13 02/17/2021 1120   CREATININE 0.70 02/17/2021 1120   CREATININE 0.67 11/06/2020 1640      Component Value  Date/Time   CALCIUM 9.4 02/17/2021 1120   ALKPHOS 95 06/25/2020 2310   AST 30 06/25/2020 2310   ALT 29 06/25/2020 2310   BILITOT 1.0 06/25/2020 2310     Lab Results  Component Value Date   WBC 5.9 11/06/2020   HGB 12.6 11/06/2020   HCT 36.0 11/06/2020   MCV 88.7 11/06/2020   PLT 219 11/06/2020   Lab Results  Component Value Date   TSH 1.08 09/25/2019   Lab Results  Component Value Date   CHOL 163 01/28/2020   HDL 75 01/28/2020   LDLCALC 81 01/28/2020   LDLDIRECT 136.9 09/20/2007   TRIG 33 01/28/2020   CHOLHDL 2.2 01/28/2020    Lab Results  Component Value Date   HGBA1C 5.0 01/28/2020   IMPRESSION AND PLAN:  1) Hx of hemorrhagic CVA with residual R LE spastic hemiplegia. Ongoing PT ending pretty soon.  She is ambulating with walker well. Ongoing LE botox inj's per Dr. Ephriam Knuckles. Zanaflex bid usually. AFO causing pain in foot.  I think she needs a custom AFO to accommodate her altered ankle/foot position and gait d/t her CVA.  Referral to podiatry ordered today.  2) Chronic pain syndrome: stable.  Vicodin prn, she uses sparingly/appropriately.  3) HTN: controlled. Lytes/cr today.  4) Anxiety: alprazolam tid prn long term.  5) Chronic hyponatremia. Following Na today.  6) Osteopenia: Last DEXA approx 3 yrs ago. DEXA ordered today.  An After Visit Summary was printed and given to the patient.  FOLLOW UP: Return in about 3 months (around 05/20/2021) for routine chronic illness f/u.  Signed:  Crissie Sickles, MD           02/17/2021

## 2021-02-20 ENCOUNTER — Telehealth: Payer: Self-pay

## 2021-02-20 NOTE — Telephone Encounter (Signed)
Spoke with pt to schedule pt for AWV. Pt will like a CB in a week due to multiple upcoming appts.

## 2021-02-23 ENCOUNTER — Ambulatory Visit (HOSPITAL_COMMUNITY): Payer: Medicare Other | Admitting: Physical Therapy

## 2021-02-23 ENCOUNTER — Other Ambulatory Visit: Payer: Self-pay

## 2021-02-23 DIAGNOSIS — M6281 Muscle weakness (generalized): Secondary | ICD-10-CM

## 2021-02-23 DIAGNOSIS — R29898 Other symptoms and signs involving the musculoskeletal system: Secondary | ICD-10-CM

## 2021-02-23 DIAGNOSIS — R278 Other lack of coordination: Secondary | ICD-10-CM | POA: Diagnosis not present

## 2021-02-23 DIAGNOSIS — R262 Difficulty in walking, not elsewhere classified: Secondary | ICD-10-CM

## 2021-02-23 NOTE — Therapy (Signed)
[place], [place], [address] Phone: [phone]   Fax:  [phone]  Physical Therapy Treatment  Patient Details  Name: Alison Cuevas MRN: [id number] Date of Birth: August 09, 1945 Referring Provider (PT): Alger Simons MD  PHYSICAL THERAPY DISCHARGE SUMMARY  Visits from Start of Care: 5  Current functional level related to goals / functional outcomes: See below   Remaining deficits: See below   Education / Equipment: See assessment    Patient agrees to discharge. Patient goals were partially met. Patient is being discharged due to maximized rehab potential.    Encounter Date: 02/23/2021   PT End of Session - 02/23/21 1425     Visit Number 5    Number of Visits 6    Date for PT Re-Evaluation 03/09/21    Authorization Type Medicare A/ [place] NAP 2ndary    Progress Note Due on Visit 10    PT Start Time 1348    PT Stop Time 1428    PT Time Calculation (min) 40 min    Equipment Utilized During Treatment Gait belt    Activity Tolerance Patient tolerated treatment well    Behavior During Therapy WFL for tasks assessed/performed             Past Medical History:  Diagnosis Date   Allergic rhinitis    maple pollen   Anxiety    Arthritis    Bowel obstruction (HCC)    Chronic low back pain    Dr. Trenton Gammon.  Has had back injection--bp elevated after.   Chronic pain syndrome    Chronic renal insufficiency, stage 2 (mild)    GFR 60-70   DDD (degenerative disc disease), cervical    Hx of ACDF (Dr. [name]).  Followed by Dr. [name].  Also, Dr. [name] do left C7-T1 intralaminal epidural injection.   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  Resid R arm and leg weakness +  flexion contractures.   Hypercholesteremia    mild; pt declined statin trial 05/2014--needs recheck lipid panel at first f/u visit in 2017   Hypertension    Osteopenia 06/2017   T score -1.4 FRAX 15% / 2%   Ovarian cyst 2017   82019-robotic assisted bilatel SPO: all path benign.   Perianal dermatitis    prn cutivate   Phlebitis    Right hemiplegia ([place]) 01/2020   R arm and leg (hemorrhagic L cerebral hemisphere CVA)   Right knee injury 2018   Patellofemoral crush injury--Dr. [name].   Stroke [facility])    Trochanteric bursitis of right hip    Recurrent (injection by Dr. 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 [name]). Recurrent fungal/bact OE.   Varicose vein    left leg     Past Surgical History:  Procedure Laterality Date   ABDOMINAL HYSTERECTOMY  03/2015   TAH.  Last pap 2016.  No hx of abnl paps.  Per GYN, no further pap smears indicated.   ANTERIOR AND POSTERIOR REPAIR N/A 03/18/2014   Procedure: Cystocele repair with graft, Vault suspension, Rectocele repair;  Surgeon: [name], MD;  [facility];  Service: Urology;  Laterality: N/A;   CHOLECYSTECTOMY  CHOLECYSTECTOMY, LAPAROSCOPIC     COLON SURGERY     COLONOSCOPY  04/2006; 05/26/16   2007 (Dr. [name]): Normal.  04/2016 (Dr. [name]) normal except diverticulosis and decreased anal sphincter tone.  No repeat colonoscopy is recommended due to age.   cspine surgery     Dr. Wiliam Ke level ant cerv discectomy [name] w/plating   CYSTO N/A 03/18/2014   Procedure: CYSTO;  Surgeon: [name], MD;  [facility];  Service: Urology;  Laterality: N/A;   DEXA  07/30/2015; 06/2018   2017 and 2020 -->Osteopenia--repeat 2 yrs.   LYSIS OF ADHESION N/A 01/31/2018   Procedure: POSSIBLE LYSIS OF ADHESION;  Surgeon: Everitt Amber, MD;  [facility];  Service: Gynecology;  Laterality: N/A;   [place]     with prolasped  bledder repair   RESECTION OF COLON     BENIGN TUMOR   RIGHT EAR SURGERY  08/23/2017   Dr. 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  Procedure: XI ROBOTIC ASSISTED BILATERAL SALPINGO OOPHORECTOMY;  Surgeon: Everitt Amber, MD;  [facility];  Service: Gynecology;  Laterality: Bilateral;   TRANSTHORACIC ECHOCARDIOGRAM  01/27/2020   EF 65-70%, NORMAL.   TUBAL LIGATION      There were no vitals filed for this visit.   Subjective Assessment - 02/23/21 1411     Subjective Patient states her legs are not too good today. She says she hit the side of her leg on her shoe. She forgot to take her pain meds today. Pain in legs about a 7.    Pertinent History Got injection in quads and PF on 01/13/21. States that her leg has progressively turned out more and more and now she is having pain along the outside of her foot. States that she has been using her jas brace 2x/day for 30 minutes in each position. States that she is still using it but feels her foot has started to turn out. States that she has a lot of pain along the 5th met. States that it hurts real bad. States she is frustrated and doesn't know what to do.    Limitations Standing;Walking;House hold activities    Patient Stated Goals to walk better/ with less pain    Currently in Pain? Yes    Pain Score 7     Pain Location Leg    Pain Descriptors / Indicators --   diffuse   Pain Type Chronic pain                [facility] PT Assessment - 02/23/21 0001        Assessment   Medical Diagnosis Right side weakness S/P CVA    Referring Provider (PT) Alger Simons MD    Prior Therapy yes      Precautions   Precautions Fall    Required Braces or Orthoses Other Brace/Splint    Other Brace/Splint Right AFO      Restrictions   Weight Bearing Restrictions No      Balance Screen   Has the patient fallen in the past  6 months No      Home Ecologist residence    Living Arrangements Other (Comment)    Available Help at Discharge Family      Prior Function   Level of [place] with basic ADLs      Cognition   Overall Cognitive Status Within Functional Limits for tasks assessed      Ambulation/Gait   Ambulation/Gait Yes    Ambulation/Gait Assistance 6: Modified independent (Device/Increase time)    Ambulation Distance (Feet) 145 Feet    Assistive device Rolling walker    Gait Pattern Decreased stride length;Decreased dorsiflexion - right;Step-to pattern    Ambulation Surface Level;Indoor    Gait Comments 2MWT                                     PT Short Term Goals - 02/23/21 1412       PT SHORT TERM GOAL #1   Title Patient will be independent with initial HEP and self-management strategies to improve functional outcomes    Baseline Reports compliance and demos good return with activity, very motivated    Time 3    Period Weeks    Status Achieved    Target Date 02/16/21      PT SHORT TERM GOAL #2   Title Patient will report at least 25% improvement in overall symptoms and/or function to demonstrate improved functional mobility    Baseline "I cant say"    Time 3    Period Weeks    Status Unable to assess    Target Date 02/16/21               PT Long Term Goals - 02/23/21 1413       PT LONG TERM GOAL #1   Title Patient will report at least 50% improvement in overall symptoms and/or function to demonstrate improved functional mobility    Baseline "I  cant say"    Time 6    Period Weeks    Status Unable to assess      PT LONG TERM GOAL #2   Title Patient will be indpendent in advanced HEP to improve functional outcomes.    Baseline Demos good return, reviewed HEP, organized folder with patient, answered all questions    Time 6    Period Weeks    Status Achieved      PT LONG TERM GOAL #3   Title Patient will report making apt with orthotic place to get new orthotic for right LE    Baseline Has appointment with podietry for orthotic consult 9/1    Time 6    Period Weeks    Status Achieved                   Plan - 02/23/21 1425     Clinical Impression Statement Discussed transition to HEP. Extensive review of home exercises and reorganized patients' folder (separated OT and PT exercises, discarded duplicate exercise sheets). Discussed POC to follow up with podiatry for custom brace and possible return to physical therapy later, if necessary for gait training with new brace. Patient currently at max benefit, does show improved gait and sequencing. Patient remains limited by decreased self-perceived function. Will DC to home program today. Answered all patient questions. Encouraged patient to follow up with therapy services with any further questions or concerns.    Personal Factors and  Comorbidities Age;Comorbidity 1    Comorbidities CVA    Examination-Activity Limitations Locomotion Level;Bed Mobility;Stand;Stairs;Transfers    Examination-Participation Restrictions Community Activity;Yard Work;Laundry;Driving;Cleaning    Stability/Clinical Decision Making Stable/Uncomplicated    Rehab Potential Good    PT Treatment/Interventions ADLs/Self Care Home Management;Aquatic Therapy;Iontophoresis 51m/ml Dexamethasone;DME Instruction;Balance training;Passive range of motion;Scar mobilization;Neuromuscular re-education;Gait training;Stair training;Functional mobility training;Patient/family education;Manual techniques;Dry needling;Energy  conservation;Manual lymph drainage;Parrafin;Ultrasound;Therapeutic activities;Canalith Repostioning;Moist Heat;Traction;Biofeedback;Cryotherapy;Fluidtherapy;Electrical Stimulation;Contrast Bath;Therapeutic exercise;Orthotic Fit/Training;Compression bandaging;Splinting;Taping;Vasopneumatic Device;Joint Manipulations;Spinal Manipulations;Visual/perceptual remediation/compensation;Vestibular    PT Next Visit Plan DC to HEP    PT Home Exercise Plan f/u with MD about order for new/different orthotic 8/18 weight shifts with focus on keeping heel down; hamstring stretch, toe/flexion PROM    Consulted and Agree with Plan of Care Patient             Patient will benefit from skilled therapeutic intervention in order to improve the following deficits and impairments:  Pain, Improper body mechanics, Abnormal gait, Decreased balance, Decreased activity tolerance, Decreased range of motion, Decreased strength, Hypomobility, Decreased mobility, Impaired sensation, Difficulty walking  Visit Diagnosis: Other lack of coordination  Muscle weakness (generalized)  Difficulty in walking, not elsewhere classified  Other symptoms and signs involving the musculoskeletal system     Problem List Patient Active Problem List   Diagnosis Date Noted   Thrombocytopenia, unspecified ([facility] 11/19/2020   Pain in joint of right foot 04/15/2020   Right spastic hemiparesis ([facility] 03/04/2020   Chronic otitis externa of right ear 02/20/2020   Intraparenchymal hemorrhage of brain ([facility]    Delirium    ICH (intracerebral hemorrhage) ([facility] - L frontoparietal, hypertensive 01/26/2020   Stroke (cerebrum) ([facility] 01/26/2020   Conductive hearing loss of right ear with unrestricted hearing of left ear 05/28/2019   Hyponatremia 08/31/2017   Generalized weakness 08/31/2017   Dehydration 08/31/2017   UTI (urinary tract infection) 08/31/2017   Tympanic membrane rupture, right 12/01/2016   Right ovarian cyst 08/22/2016    Fibromyalgia 06/03/2015   Cystocele 03/18/2014   Tick-borne disease 12/25/2013   Diverticulosis of colon without hemorrhage 06/12/2013   Hypertension    Osteopenia    Chronic low back pain 05/12/2011   HYPERCHOLESTEROLEMIA 03/17/2008   Anxiety state 09/17/2007   GERD 09/17/2007   DJD (degenerative joint disease) 09/17/2007   2:34 PM, 02/23/21 CJosue HectorPT DPT  Physical Therapist with [facility] [phone]SW[address] [place] [address]Phone: [phone]  Fax:  [phone] Name: Alison DenunzioMRN: [id number] of Birth: 410/03/1946

## 2021-02-25 ENCOUNTER — Other Ambulatory Visit: Payer: Self-pay

## 2021-02-25 ENCOUNTER — Encounter: Payer: Self-pay | Admitting: Podiatry

## 2021-02-25 ENCOUNTER — Ambulatory Visit (INDEPENDENT_AMBULATORY_CARE_PROVIDER_SITE_OTHER): Payer: Medicare Other

## 2021-02-25 ENCOUNTER — Ambulatory Visit (INDEPENDENT_AMBULATORY_CARE_PROVIDER_SITE_OTHER): Payer: Medicare Other | Admitting: Podiatry

## 2021-02-25 DIAGNOSIS — M21371 Foot drop, right foot: Secondary | ICD-10-CM

## 2021-02-25 DIAGNOSIS — M79671 Pain in right foot: Secondary | ICD-10-CM

## 2021-02-25 NOTE — Progress Notes (Signed)
Subjective:   Patient ID: Alison Cuevas, female   DOB: 75 y.o.   MRN: [id number]   HPI Patient presents with caregiver with right foot pain and patient has had a stroke 1 year ago and was given over-the-counter brace which is not fitting her properly and creating abrasion irritation and she wants to be able to walk again if possible.  States that she is doing Botox injections and she is doing physical therapy to try to help regain but does have abnormal structure of the right foot with foot drop.  Patient does not smoke and is not active is in wheelchair currently   Review of Systems  All other systems reviewed and are negative.      Objective:  Physical Exam Vitals and nursing note reviewed.  Constitutional:      Appearance: She is well-developed.  Pulmonary:     Effort: Pulmonary effort is normal.  Musculoskeletal:        General: Normal range of motion.  Skin:    General: Skin is warm.  Neurological:     Mental Status: She is alert.    Vascular status was found to be intact diminished neurological both sharp dull and vibratory and patient has muscle strength loss with no apparent function of the anterior tibial tendon extensor tendon group with fixed equinus condition.  There is inversion of the foot with cavus foot structure     Assessment:  Significant stroke creating damage to the right foot with weakness of the muscle groups     Plan:  H&P reviewed condition and I have recommended trying a customized AFO brace that may be more comfortable for her and something that she will be able to wear.  At this point patient is to see the ped orthotist and I reviewed this with her caregiver as far as what we can try to do to help her

## 2021-02-26 ENCOUNTER — Other Ambulatory Visit: Payer: Medicare Other

## 2021-02-26 ENCOUNTER — Other Ambulatory Visit: Payer: Self-pay | Admitting: Podiatry

## 2021-02-26 DIAGNOSIS — M21371 Foot drop, right foot: Secondary | ICD-10-CM

## 2021-03-19 ENCOUNTER — Ambulatory Visit
Admission: EM | Admit: 2021-03-19 | Discharge: 2021-03-19 | Disposition: A | Discharge status: Home / Self Care | Payer: Medicare Other | Attending: Emergency Medicine | Admitting: Emergency Medicine

## 2021-03-19 ENCOUNTER — Other Ambulatory Visit: Payer: Self-pay

## 2021-03-19 ENCOUNTER — Encounter: Payer: Self-pay | Admitting: Emergency Medicine

## 2021-03-19 DIAGNOSIS — N3000 Acute cystitis without hematuria: Secondary | ICD-10-CM | POA: Diagnosis not present

## 2021-03-19 DIAGNOSIS — R300 Dysuria: Secondary | ICD-10-CM | POA: Insufficient documentation

## 2021-03-19 LAB — POCT URINALYSIS DIP (MANUAL ENTRY)
Bilirubin, UA: NEGATIVE
Blood, UA: NEGATIVE
Glucose, UA: NEGATIVE mg/dL
Ketones, POC UA: NEGATIVE mg/dL
Leukocytes, UA: NEGATIVE
Nitrite, UA: POSITIVE — AB
Protein Ur, POC: NEGATIVE mg/dL
Spec Grav, UA: 1.02 (ref 1.010–1.025)
Urobilinogen, UA: 0.2 E.U./dL
pH, UA: 6 (ref 5.0–8.0)

## 2021-03-19 MED ORDER — CEPHALEXIN 500 MG PO CAPS: 500.0000 mg | ORAL_CAPSULE | Freq: Two times a day (BID) | ORAL | 0 refills | Status: AC

## 2021-03-19 NOTE — ED Provider Notes (Signed)
[facility]   CC: Burning with urination  SUBJECTIVE:  Alison Cuevas is a 75 y.o. female who complains of dysuria, frequency, urgency, and lower abdominal pressure x 5 days.  Patient denies delayed bathroom breaks, but does admit to wearing adult diapers.  Has tried OTC medications without relief.  Symptoms are made worse with urination.  Admits to similar symptoms in the past.  Denies fever, chills, nausea, vomiting, hematuria.    LMP: Patient's last menstrual period was 06/27/1972.  ROS: As in HPI.  All other pertinent ROS negative.     Past Medical History:  Diagnosis Date   Allergic rhinitis    maple pollen   Anxiety    Arthritis    Bowel obstruction (HCC)    Chronic low back pain    Dr. Trenton Gammon.  Has had back injection--bp elevated after.   Chronic pain syndrome    Chronic renal insufficiency, stage 2 (mild)    GFR 60-70   DDD (degenerative disc disease), cervical    Hx of ACDF (Dr. [name]).  Followed by Dr. [name].  Also, Dr. [name] do left C7-T1 intralaminal epidural injection.   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  Resid R arm and leg weakness + flexion contractures.   Hypercholesteremia    mild; pt declined statin trial 05/2014--needs recheck lipid panel at first f/u visit in 2017   Hypertension    Osteopenia 06/2017   T score -1.4 FRAX 15% / 2%   Ovarian cyst 2017   82019-robotic assisted bilatel SPO: all path benign.   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 [name].   Stroke [facility])    Trochanteric bursitis of right hip    Recurrent (injection by Dr.  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 [name]). Recurrent fungal/bact OE.   Varicose vein    left leg    Past Surgical History:  Procedure Laterality Date   ABDOMINAL HYSTERECTOMY  03/2015   TAH.  Last pap 2016.  No hx of abnl paps.  Per GYN, no further pap smears indicated.   ANTERIOR AND POSTERIOR REPAIR N/A 03/18/2014   Procedure: Cystocele repair with graft, Vault suspension, Rectocele repair;  Surgeon: [name], MD;  Location: [facility];  Service: Urology;  Laterality: N/A;   CHOLECYSTECTOMY     CHOLECYSTECTOMY, LAPAROSCOPIC     COLON SURGERY     COLONOSCOPY  04/2006; 05/26/16   2007 (Dr. [name]): Normal.  04/2016 (Dr. [name]) normal except diverticulosis and decreased anal sphincter tone.  No repeat colonoscopy is recommended due to age.   cspine surgery     Dr. Wiliam Ke level ant cerv discectomy [name] w/plating   CYSTO N/A 03/18/2014   Procedure: CYSTO;  Surgeon: [name], MD;  Location: [facility];  Service: Urology;  Laterality: N/A;   DEXA  07/30/2015; 06/2018   2017 and 2020 -->Osteopenia--repeat 2 yrs.   LYSIS OF ADHESION N/A 01/31/2018   Procedure: POSSIBLE LYSIS OF ADHESION;  Surgeon: Everitt Amber, MD;  Location:  [facility];  Service: Gynecology;  Laterality: N/A;   [place]     with prolasped bledder repair   RESECTION OF COLON     BENIGN TUMOR   RIGHT EAR SURGERY  08/23/2017   Dr. 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  Procedure: XI ROBOTIC ASSISTED BILATERAL SALPINGO OOPHORECTOMY;  Surgeon: Everitt Amber, MD;  Location: [facility];  Service: Gynecology;  Laterality: Bilateral;   TRANSTHORACIC ECHOCARDIOGRAM  01/27/2020   EF 65-70%, NORMAL.   TUBAL LIGATION     Allergies  Allergen Reactions   Gabapentin Anxiety    Elevated heart rate and crazy dreams   Prednisone Anxiety and Other (See Comments)    REACTION: funny feeling   No current facility-administered medications on file prior to encounter.   Current Outpatient Medications on File Prior to Encounter  Medication Sig Dispense Refill   acetaminophen (TYLENOL) 500 MG tablet Take 1 tablet (500 mg total) by mouth 3 (three) times daily. 30 tablet 0   ALPRAZolam (XANAX) 0.5 MG tablet TAKE 1 TABLET BY MOUTH THREE TIMES A DAY AS NEEDED FOR ANXIETY 90 tablet 5   aspirin EC 81 MG tablet Take 1 tablet (81 mg total) by mouth daily. Swallow whole. 30 tablet 11   Biotin 5 MG TABS Take 5 mg by mouth daily.     fluticasone (FLONASE) 50 MCG/ACT nasal spray Place 2 sprays into both nostrils at bedtime. 48 g 3   HYDROcodone-acetaminophen (NORCO/VICODIN) 5-325 MG tablet 1/2-1 tab po q6h prn pain 45 tablet 0   ibuprofen (ADVIL) 200 MG tablet Take 200 mg by mouth every 6 (six) hours as needed.     lisinopril (ZESTRIL) 5 MG tablet Take 1 tablet (5 mg total) by mouth daily. 90 tablet 3   loratadine (CLARITIN) 10 MG tablet Take 10 mg by mouth every other day.      meclizine (ANTIVERT) 12.5 MG tablet Take 1 tablet (12.5 mg total) by mouth 3 (three) times daily. (Patient taking differently: Take 12.5 mg by mouth. Takes at night) 30 tablet 0   Multiple Minerals-Vitamins (CALCIUM-MAGNESIUM-ZINC-D3 PO) Take 1 tablet by mouth daily.      nystatin ointment (MYCOSTATIN) Apply 1 application topically 2 (two) times daily. Apply to affected area for up to 7 days. 30 g 0   pantoprazole (PROTONIX) 40 MG tablet Take 1 tablet (40 mg total) by mouth at  bedtime. 90 tablet 3   polyethylene glycol (MIRALAX / GLYCOLAX) 17 g packet Take 17 g by mouth daily as needed.     potassium chloride SA (KLOR-CON) 20 MEQ tablet Take 1 tablet (20 mEq total) by mouth daily. 90 tablet 3   Sodium Fluoride (CLINPRO 5000) 1.1 % PSTE Clinpro 5000 1.1 % dental paste     tiZANidine (ZANAFLEX) 2 MG tablet TAKE 1 TABLET BY MOUTH 3 TIMES DAILY AS NEEDED FOR MUSCLE RELAXATION 270 tablet 0   triamcinolone ointment (KENALOG) 0.1 % triamcinolone acetonide 0.1 % topical ointment     verapamil (CALAN-SR) 180 MG CR tablet Take 1 tablet (180 mg total) by  mouth daily. 90 tablet 3   Social History   Socioeconomic History   Marital status: Widowed    Spouse name: [name]   Number of children: 3   Years of education: Not on file   Highest education level: Not on file  Occupational History   Occupation: retired  Tobacco Use   Smoking status: Never   Smokeless tobacco: Never  Vaping Use   Vaping Use: Never used  Substance and Sexual Activity   Alcohol use: No    Alcohol/week: 0.0 standard drinks   Drug use: No   Sexual activity: Not Currently    Birth control/protection: Surgical    Comment: hysterectomy  Other Topics Concern   Not on file  Social History Narrative   Married, has 3 children (one lives near her).  Has 8 grandchildren, 2 greatgrandchildren.   Worked cleaning apartments, worked at [employer], was a Secretary/administrator.   She retired at age 67.   No tobacco.  No alcohol.  No drugs.   Exercise: clean, work in yard.        10 siblings in all--5 have passed away--1 child age 18 with whooping cough , 1 lupus, 1 melanoma,1 lung cancer, 1 pulm fibrosis   Social Determinants of Health   Financial Resource Strain: Not on file  Food Insecurity: Not on file  Transportation Needs: Not on file  Physical Activity: Not on file  Stress: Not on file  Social Connections: Not on file  Intimate Partner Violence: Not on file   Family History  Problem Relation  Age of Onset   Hypertension Mother    Diverticulitis Mother    Breast cancer Sister 2   Melanoma Brother    Prostate cancer Brother    Leukemia Brother    Lupus Sister    Lung disease Brother    Stomach cancer Father    Other Other        Family member with MGUS    OBJECTIVE:  Vitals:   03/19/21 1738  BP: (!) 152/77  Pulse: 89  Resp: 16  Temp: 97.7 F (36.5 C)  TempSrc: Oral  SpO2: 96%   General appearance: AOx3 in no acute distress; frail appearing HEENT: NCAT.  Oropharynx clear.  Lungs: clear to auscultation bilaterally without adventitious breath sounds Heart: regular rate and rhythm.   Abdomen: soft; non-distended; no tenderness; bowel sounds present; no guarding Back: mild LT sided CVA tenderness Extremities: no edema; symmetrical with no gross deformities Skin: warm and dry Neurologic: Ambulates from chair to exam table without difficulty Psychological: alert and cooperative; normal mood and affect  Labs Reviewed  POCT URINALYSIS DIP (MANUAL ENTRY) - Abnormal; Notable for the following components:      Result Value   Nitrite, UA Positive (*)    All other components within normal limits  URINE CULTURE    ASSESSMENT & PLAN:  1. Acute cystitis without hematuria   2. Dysuria     Meds ordered this encounter  Medications   cephALEXin (KEFLEX) 500 MG capsule    Sig: Take 1 capsule (500 mg total) by mouth 2 (two) times daily for 10 days.    Dispense:  20 capsule    Refill:  0    Order Specific Question:   Supervising Provider    Answer:   Raylene Everts [[id number]]   Urine concerning for infection Urine culture sent.  We will call you with abnormal results.   Push fluids and get plenty of rest.   Take  antibiotic as directed and to completion Follow up with PCP if symptoms persists Return here or go to ER if you have any new or worsening symptoms such as fever, worsening abdominal pain, nausea/vomiting, flank pain, etc...  Outlined signs and symptoms  indicating need for more acute intervention. Patient verbalized understanding. After Visit Summary given.      Lestine Box, PA-C 03/19/21 1755

## 2021-03-19 NOTE — ED Triage Notes (Signed)
Lower abd pain and urgency x 4 to 5 days.  Burning on urination.  Has been taking AZO

## 2021-03-19 NOTE — Discharge Instructions (Signed)
Urine concerning for infection Urine culture sent.  We will call you with abnormal results.   Push fluids and get plenty of rest.   Take antibiotic as directed and to completion Follow up with PCP if symptoms persists Return here or go to ER if you have any new or worsening symptoms such as fever, worsening abdominal pain, nausea/vomiting, flank pain, etc... 

## 2021-03-21 LAB — URINE CULTURE: Culture: NO GROWTH

## 2021-03-23 DIAGNOSIS — Z8669 Personal history of other diseases of the nervous system and sense organs: Secondary | ICD-10-CM | POA: Diagnosis not present

## 2021-03-23 DIAGNOSIS — Z9889 Other specified postprocedural states: Secondary | ICD-10-CM | POA: Diagnosis not present

## 2021-03-23 DIAGNOSIS — H938X1 Other specified disorders of right ear: Secondary | ICD-10-CM | POA: Diagnosis not present

## 2021-03-23 DIAGNOSIS — H90A22 Sensorineural hearing loss, unilateral, left ear, with restricted hearing on the contralateral side: Secondary | ICD-10-CM | POA: Diagnosis not present

## 2021-03-23 DIAGNOSIS — H90A31 Mixed conductive and sensorineural hearing loss, unilateral, right ear with restricted hearing on the contralateral side: Secondary | ICD-10-CM | POA: Diagnosis not present

## 2021-03-23 DIAGNOSIS — H7291 Unspecified perforation of tympanic membrane, right ear: Secondary | ICD-10-CM | POA: Diagnosis not present

## 2021-03-26 ENCOUNTER — Other Ambulatory Visit: Payer: Self-pay | Admitting: Family Medicine

## 2021-03-29 ENCOUNTER — Telehealth: Payer: Self-pay | Admitting: Podiatry

## 2021-03-29 NOTE — Telephone Encounter (Signed)
Pt left message asking about her brace appt on 10.7, she was asking if the brace was in..  I returned call and it just rings,the brace is in if pt calls back.

## 2021-04-02 ENCOUNTER — Other Ambulatory Visit: Payer: Self-pay | Admitting: Family Medicine

## 2021-04-02 ENCOUNTER — Other Ambulatory Visit: Payer: Self-pay

## 2021-04-02 ENCOUNTER — Other Ambulatory Visit: Payer: Medicare Other

## 2021-04-02 DIAGNOSIS — M21371 Foot drop, right foot: Secondary | ICD-10-CM | POA: Diagnosis not present

## 2021-04-02 DIAGNOSIS — M79671 Pain in right foot: Secondary | ICD-10-CM | POA: Diagnosis not present

## 2021-04-05 ENCOUNTER — Ambulatory Visit (INDEPENDENT_AMBULATORY_CARE_PROVIDER_SITE_OTHER): Payer: Medicare Other | Admitting: Adult Health

## 2021-04-05 ENCOUNTER — Encounter: Payer: Self-pay | Admitting: Adult Health

## 2021-04-05 VITALS — BP 135/78 | HR 82 | Ht 60.0 in | Wt 114.0 lb

## 2021-04-05 DIAGNOSIS — R269 Unspecified abnormalities of gait and mobility: Secondary | ICD-10-CM | POA: Diagnosis not present

## 2021-04-05 DIAGNOSIS — I610 Nontraumatic intracerebral hemorrhage in hemisphere, subcortical: Secondary | ICD-10-CM

## 2021-04-05 DIAGNOSIS — I69398 Other sequelae of cerebral infarction: Secondary | ICD-10-CM

## 2021-04-05 DIAGNOSIS — I10 Essential (primary) hypertension: Secondary | ICD-10-CM | POA: Diagnosis not present

## 2021-04-05 DIAGNOSIS — G8111 Spastic hemiplegia affecting right dominant side: Secondary | ICD-10-CM | POA: Diagnosis not present
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
## 2021-04-05 NOTE — Patient Instructions (Signed)
Continue aspirin 81 mg daily for secondary stroke prevention  Continue to follow up with PCP regarding cholesterol and blood pressure management  Maintain strict control of hypertension with blood pressure goal below 130/90 and cholesterol with LDL cholesterol (bad cholesterol) goal below 70 mg/dL.   Continue to follow with Dr. Naaman Plummer for ongoing botox   Referral placed to [facility] in [place], [place]     Followup in the future with me in 6 months or call earlier if needed       Thank you for coming to see [place] at [facility]. I hope we have been able to provide you high quality care today.  You may receive a patient satisfaction survey over the next few weeks. We would appreciate your feedback and comments so that we may continue to improve ourselves and the health of our patients.

## 2021-04-12 ENCOUNTER — Encounter: Payer: Self-pay | Admitting: Family Medicine

## 2021-04-13 ENCOUNTER — Other Ambulatory Visit: Payer: Self-pay

## 2021-04-13 ENCOUNTER — Ambulatory Visit (HOSPITAL_COMMUNITY)
Admission: RE | Admit: 2021-04-13 | Discharge: 2021-04-13 | Disposition: A | Discharge status: Home / Self Care | Payer: Medicare Other | Source: Ambulatory Visit | Attending: Family Medicine | Admitting: Family Medicine

## 2021-04-13 DIAGNOSIS — Z780 Asymptomatic menopausal state: Secondary | ICD-10-CM | POA: Diagnosis not present

## 2021-04-13 DIAGNOSIS — M810 Age-related osteoporosis without current pathological fracture: Secondary | ICD-10-CM | POA: Diagnosis not present

## 2021-04-13 DIAGNOSIS — M85852 Other specified disorders of bone density and structure, left thigh: Secondary | ICD-10-CM | POA: Diagnosis not present

## 2021-04-13 DIAGNOSIS — E348 Other specified endocrine disorders: Secondary | ICD-10-CM | POA: Diagnosis not present

## 2021-04-13 DIAGNOSIS — M85832 Other specified disorders of bone density and structure, left forearm: Secondary | ICD-10-CM | POA: Diagnosis not present

## 2021-04-13 NOTE — Telephone Encounter (Signed)
Letter printed and signed and put on Britt's desk.

## 2021-04-14 ENCOUNTER — Encounter: Payer: Self-pay | Admitting: Physical Medicine & Rehabilitation

## 2021-04-14 ENCOUNTER — Encounter: Payer: Medicare Other | Attending: Physical Medicine & Rehabilitation | Admitting: Physical Medicine & Rehabilitation

## 2021-04-14 ENCOUNTER — Other Ambulatory Visit: Payer: Self-pay

## 2021-04-14 VITALS — BP 145/79 | HR 88 | Temp 97.8°F | Ht 60.0 in | Wt 114.0 lb

## 2021-04-14 DIAGNOSIS — G8111 Spastic hemiplegia affecting right dominant side: Secondary | ICD-10-CM | POA: Insufficient documentation

## 2021-04-14 NOTE — Progress Notes (Signed)
Phenol neurolysis of the right tibial nerve  Indication: Severe spasticity in the plantar flexor muscles which is not responding to medical management and other conservative care and interfering with functional use.  Informed consent was obtained after describing the risks and benefits of the procedure with the patient this includes bleeding bruising and infection as well as medication side effects. The patient elected to proceed and has given written consent. Patient placed in a prone position on the exam table. External DC stimulation was applied to the popliteal space using a nerve stimulator. Plantar flexion twitch was obtained. The popliteal region was prepped with Betadine and then entered with a 22-gauge 40 mm needle electrode under electrical stimulation guidance. Plantar flexion which was obtained and confirmed. Then 4 cc of 5% phenol were injected. The patient tolerated procedure well. Post procedure instructions and followup visit were given.   Follow up with patient in 1 mos for botox 400u RUE flexors

## 2021-04-14 NOTE — Patient Instructions (Signed)
PLEASE FEEL FREE TO CALL OUR OFFICE WITH ANY PROBLEMS OR QUESTIONS ([phone])      

## 2021-04-21 ENCOUNTER — Telehealth: Payer: Self-pay | Admitting: Physical Medicine & Rehabilitation

## 2021-04-21 ENCOUNTER — Ambulatory Visit: Payer: Medicare Other | Admitting: Physical Medicine & Rehabilitation

## 2021-04-21 NOTE — Telephone Encounter (Signed)
Patient states her leg is still turning bad and still lots of pain after nerve block wanted to know if this is normal or what to expect?

## 2021-04-22 NOTE — Telephone Encounter (Addendum)
I let her know what Dr Letta Pate said that it takes up to 2 weeks to take effect and the burning and turning may still occur.  If Dr Naaman Plummer has anything else to add, we will let her know.

## 2021-04-23 ENCOUNTER — Other Ambulatory Visit: Payer: Self-pay | Admitting: Family Medicine

## 2021-04-29 DIAGNOSIS — H90A31 Mixed conductive and sensorineural hearing loss, unilateral, right ear with restricted hearing on the contralateral side: Secondary | ICD-10-CM | POA: Diagnosis not present

## 2021-04-29 DIAGNOSIS — H7291 Unspecified perforation of tympanic membrane, right ear: Secondary | ICD-10-CM | POA: Diagnosis not present

## 2021-04-29 DIAGNOSIS — H903 Sensorineural hearing loss, bilateral: Secondary | ICD-10-CM | POA: Diagnosis not present

## 2021-04-29 DIAGNOSIS — Z9889 Other specified postprocedural states: Secondary | ICD-10-CM | POA: Diagnosis not present

## 2021-05-03 ENCOUNTER — Other Ambulatory Visit: Payer: Self-pay | Admitting: Family Medicine

## 2021-05-05 ENCOUNTER — Encounter: Payer: Self-pay | Admitting: Physical Medicine & Rehabilitation

## 2021-05-05 ENCOUNTER — Other Ambulatory Visit: Payer: Self-pay

## 2021-05-05 ENCOUNTER — Encounter: Payer: Medicare Other | Attending: Physical Medicine & Rehabilitation | Admitting: Physical Medicine & Rehabilitation

## 2021-05-05 VITALS — BP 149/79 | HR 82 | Temp 98.2°F | Ht 60.0 in | Wt 116.6 lb

## 2021-05-05 DIAGNOSIS — G8111 Spastic hemiplegia affecting right dominant side: Secondary | ICD-10-CM | POA: Diagnosis not present

## 2021-05-05 NOTE — Progress Notes (Signed)
Botox Injection for spasticity of upper extremity using needle EMG guidance Indication: Right spastic hemiparesis (HCC) G81.11  Dilution: 100 Units/ml        Total Units Injected: 400 Indication: Severe spasticity which interferes with ADL,mobility and/or  hygiene and is unresponsive to medication management and other conservative care Informed consent was obtained after describing risks and benefits of the procedure with the patient. This includes bleeding, bruising, infection, excessive weakness, or medication side effects. A REMS form is on file and signed.  Needle: 58mm injectable monopolar needle electrode    Number of units per muscle Pectoralis Major 0 units Pectoralis Minor 0 units Triceps 100 units Biceps 200 units Brachioradialis 0 units FCR 25units FCU25 units FDS 25 units FDP 25 units FPL 0 units Palmaris Longus 0 units Pronator Teres 0 units Pronator Quadratus 0 units Lumbricals 0 units All injections were done after obtaining appropriate EMG activity and after negative drawback for blood. The patient tolerated the procedure well. Post procedure instructions were given. Return in about 3 months (around 08/05/2021) for regular visit.

## 2021-05-05 NOTE — Patient Instructions (Signed)
PLEASE FEEL FREE TO CALL OUR OFFICE WITH ANY PROBLEMS OR QUESTIONS ([phone])      

## 2021-05-06 ENCOUNTER — Ambulatory Visit (INDEPENDENT_AMBULATORY_CARE_PROVIDER_SITE_OTHER): Payer: Medicare Other | Admitting: Family Medicine

## 2021-05-06 ENCOUNTER — Encounter: Payer: Self-pay | Admitting: Family Medicine

## 2021-05-06 VITALS — BP 147/84 | HR 78 | Temp 97.6°F | Ht 60.0 in | Wt 114.2 lb

## 2021-05-06 DIAGNOSIS — I6930 Unspecified sequelae of cerebral infarction: Secondary | ICD-10-CM | POA: Diagnosis not present

## 2021-05-06 DIAGNOSIS — M85851 Other specified disorders of bone density and structure, right thigh: Secondary | ICD-10-CM | POA: Diagnosis not present

## 2021-05-06 DIAGNOSIS — M545 Low back pain, unspecified: Secondary | ICD-10-CM | POA: Diagnosis not present

## 2021-05-06 DIAGNOSIS — G8929 Other chronic pain: Secondary | ICD-10-CM | POA: Diagnosis not present

## 2021-05-06 DIAGNOSIS — M85852 Other specified disorders of bone density and structure, left thigh: Secondary | ICD-10-CM | POA: Diagnosis not present

## 2021-05-06 DIAGNOSIS — E871 Hypo-osmolality and hyponatremia: Secondary | ICD-10-CM | POA: Diagnosis not present

## 2021-05-06 DIAGNOSIS — Z23 Encounter for immunization: Secondary | ICD-10-CM | POA: Diagnosis not present

## 2021-05-06 DIAGNOSIS — H9201 Otalgia, right ear: Secondary | ICD-10-CM

## 2021-05-06 DIAGNOSIS — G894 Chronic pain syndrome: Secondary | ICD-10-CM | POA: Diagnosis not present

## 2021-05-06 DIAGNOSIS — I10 Essential (primary) hypertension: Secondary | ICD-10-CM | POA: Diagnosis not present

## 2021-05-06 DIAGNOSIS — M24561 Contracture, right knee: Secondary | ICD-10-CM

## 2021-05-06 DIAGNOSIS — M25571 Pain in right ankle and joints of right foot: Secondary | ICD-10-CM | POA: Diagnosis not present

## 2021-05-06 LAB — COMPREHENSIVE METABOLIC PANEL
ALT: 16 U/L (ref 0–35)
AST: 23 U/L (ref 0–37)
Albumin: 4.3 g/dL (ref 3.5–5.2)
Alkaline Phosphatase: 64 U/L (ref 39–117)
BUN: 12 mg/dL (ref 6–23)
CO2: 27 mEq/L (ref 19–32)
Calcium: 9.5 mg/dL (ref 8.4–10.5)
Chloride: 98 mEq/L (ref 96–112)
Creatinine, Ser: 0.67 mg/dL (ref 0.40–1.20)
GFR: 84.79 mL/min (ref >60.00)
Glucose, Bld: 83 mg/dL (ref 70–99)
Potassium: 4 mEq/L (ref 3.5–5.1)
Sodium: 133 mEq/L — ABNORMAL LOW (ref 135–145)
Total Bilirubin: 0.8 mg/dL (ref 0.2–1.2)
Total Protein: 6.8 g/dL (ref 6.0–8.3)

## 2021-05-06 LAB — VITAMIN D 25 HYDROXY (VIT D DEFICIENCY, FRACTURES): VITD: 85.58 ng/mL (ref 30.00–100.00)

## 2021-05-06 NOTE — Progress Notes (Signed)
See student note for this encounter. I have attested it. Signed:  Crissie Sickles, MD           05/06/2021

## 2021-05-06 NOTE — Progress Notes (Addendum)
OFFICE VISIT   05/06/2021   CC: No chief complaint on file.     HPI:     Patient is a 75 y.o. 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  1) Hx of hemorrhagic CVA with residual R LE spastic hemiplegia: Ongoing PT ending pretty soon and patient feels she has improved immensely.  She is ambulating with walker well. Ongoing LE botox inj's per Dr. Ephriam Knuckles. Zanaflex bid usually for symptoms. Custom AFO causing pain in foot whenever she is ambulating. She is able to tolerate the discomfort. She is consistently seeing her podiatrist.   2) Chronic pain syndrome: stable.  Vicodin prn, she uses sparingly/appropriately.   3) HTN: is concerned about her current medication regimen. Brought in recorded BP over the last few weeks with systolics ranging 431V-400Q. Denies unilateal headache or changes in vision.   4) Anxiety: alprazolam tid prn long term. Currently grieving and does not want any medications for anxiety   5) Chronic hyponatremia: Discussed we're checking Na today.   6) Osteopenia: Denies back pain aside from area where she had surgery on her neck. She recently received a DEXA scan (04/13/21).  7. Patient mentioned her right knee frequently buckles during exercise. It first occurred around 4-5 weeks ago and there is no pain associated with it.   ROS as above, plus--> no fevers, no CP, no SOB, no wheezing, no cough, no dizziness, no HAs, no rashes, no melena/hematochezia.  No polyuria or polydipsia.   No new focal weakness, paresthesias, or tremors.  No acute vision or hearing abnormalities.  No dysuria or unusual/new urinary urgency or frequency.  No recent changes in lower legs. No n/v/d or abd pain.  No palpitations.          Past Medical History:  Diagnosis Date   Allergic rhinitis      maple pollen   Anxiety      Arthritis     Bowel obstruction (HCC)     Chronic low back pain      Dr. Trenton Gammon.  Has had back injection--bp elevated after.   Chronic pain syndrome     Chronic renal insufficiency, stage 2 (mild)      GFR 60-70   DDD (degenerative disc disease), cervical      Hx of ACDF (Dr. [name]).  Followed by Dr. [name].  Also, Dr. [name] do left C7-T1 intralaminal epidural injection.   Diverticulosis of colon     DJD (degenerative joint disease)     Gallstones     GERD (gastroesophageal reflux disease)     Herpes zoster 07/08/2014   History of CVA with residual deficit 01/2020    L frontoparietal hemorrhagic CVA, R MCA/PCA watershed ischemic CVA.  Resid R arm and leg weakness + flexion contractures.   Hypercholesteremia      mild; pt declined statin trial 05/2014--needs recheck lipid panel at first f/u visit in 2017   Hypertension     Osteopenia 06/2017    T score -1.4 FRAX 15% / 2%   Ovarian cyst 2017    82019-robotic assisted bilatel SPO: all path benign.   Perianal dermatitis      prn cutivate   Phlebitis     Right hemiplegia ([place]) 01/2020    R arm and leg (hemorrhagic L cerebral hemisphere CVA)   Right knee injury 2018    Patellofemoral  crush injury--Dr. [name].   Stroke [facility])     Trochanteric bursitis of right hip      Recurrent (injection by Dr. [name] 05/26/15)   Tympanic membrane rupture, right 12/01/2016    As of 08/2018 pt set for tympanomastoidectomy and STSG (Dr. [name]). Recurrent fungal/bact OE.   Varicose vein      left leg            Past Surgical History:  Procedure Laterality Date   ABDOMINAL HYSTERECTOMY   03/2015    TAH.  Last pap 2016.  No hx of abnl paps.  Per GYN, no further pap smears indicated.   ANTERIOR AND POSTERIOR REPAIR N/A 03/18/2014    Procedure: Cystocele repair with graft, Vault suspension, Rectocele repair;  Surgeon: [name], MD;  Location: [facility];  Service: Urology;  Laterality: N/A;   CHOLECYSTECTOMY        CHOLECYSTECTOMY, LAPAROSCOPIC       COLON SURGERY       COLONOSCOPY   04/2006; 05/26/16    2007 (Dr. [name]): Normal.  04/2016 (Dr. [name]) normal except diverticulosis and decreased anal sphincter tone.  No repeat colonoscopy is recommended due to age.   cspine surgery        Dr. Wiliam Ke level ant cerv discectomy [name] w/plating   CYSTO N/A 03/18/2014    Procedure: CYSTO;  Surgeon: [name], MD;  Location: [facility];  Service: Urology;  Laterality: N/A;   DEXA   07/30/2015; 06/2018    2017 and 2020 -->Osteopenia--repeat 2 yrs. T score -2.29 Mar 2021. Rpt 2 yrs.   LYSIS OF ADHESION N/A 01/31/2018    Procedure: POSSIBLE LYSIS OF ADHESION;  Surgeon: Everitt Amber, MD;  Location: [facility];  Service: Gynecology;  Laterality: N/A;   [place]terbein        with prolasped bledder repair   RESECTION OF COLON        BENIGN TUMOR   RIGHT EAR SURGERY   08/23/2017    Dr. 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  Procedure: XI ROBOTIC ASSISTED BILATERAL SALPINGO OOPHORECTOMY;  Surgeon: Everitt Amber, MD;  Location: [facility];  Service: Gynecology;  Laterality: Bilateral;   TRANSTHORACIC ECHOCARDIOGRAM   01/27/2020    EF 65-70%, NORMAL.   TUBAL LIGATION                Outpatient Medications Prior to Visit  Medication Sig Dispense Refill   acetaminophen (TYLENOL) 500 MG tablet Take 1 tablet (500 mg total) by mouth 3 (three) times daily. 30 tablet 0   ALPRAZolam (XANAX) 0.5 MG tablet TAKE 1 TABLET BY MOUTH  THREE TIMES A DAY AS NEEDED FOR ANXIETY 90 tablet 5   aspirin EC 81 MG tablet Take 1 tablet (81 mg total) by mouth daily. Swallow whole. 30 tablet 11   Biotin 5 MG TABS Take 5 mg by mouth daily.       fluticasone (FLONASE) 50 MCG/ACT nasal spray Place 2 sprays into both nostrils at bedtime. 48 g 3   HYDROcodone-acetaminophen (  NORCO/VICODIN) 5-325 MG tablet 1/2-1 tab po q6h prn pain 45 tablet 0   ibuprofen (ADVIL) 200 MG tablet Take 200 mg by mouth every 6 (six) hours as needed.       KLOR-CON M20 20 MEQ tablet TAKE 1 TABLET BY MOUTH EVERY DAY 90 tablet 1   lisinopril (ZESTRIL) 5 MG tablet TAKE 1 TABLET BY MOUTH EVERY DAY 90 tablet 1   loratadine (CLARITIN) 10 MG tablet Take 10 mg by mouth every other day.        meclizine (ANTIVERT) 12.5 MG tablet Take 1 tablet (12.5 mg total) by mouth 3 (three) times daily. (Patient taking differently: Take 12.5 mg by mouth. Takes at night) 30 tablet 0   Multiple Minerals-Vitamins (CALCIUM-MAGNESIUM-ZINC-D3 PO) Take 1 tablet by mouth daily.        nystatin ointment (MYCOSTATIN) Apply 1 application topically 2 (two) times daily. Apply to affected area for up to 7 days. 30 g 0   pantoprazole (PROTONIX) 40 MG tablet TAKE 1 TABLET BY MOUTH EVERYDAY AT BEDTIME 90 tablet 0   polyethylene glycol (MIRALAX / GLYCOLAX) 17 g packet Take 17 g by mouth daily as needed.       Sodium Fluoride (CLINPRO 5000) 1.1 % PSTE Clinpro 5000 1.1 % dental paste       tiZANidine (ZANAFLEX) 2 MG tablet TAKE 1 TABLET BY MOUTH 3 TIMES DAILY AS NEEDED FOR MUSCLE RELAXATION 270 tablet 0   triamcinolone ointment (KENALOG) 0.1 % triamcinolone acetonide 0.1 % topical ointment       verapamil (CALAN-SR) 180 MG CR tablet Take 1 tablet (180 mg total) by mouth daily. 90 tablet 3    No facility-administered medications prior to visit.           Allergies  Allergen Reactions   Gabapentin Anxiety      Elevated heart rate and crazy dreams   Prednisone Anxiety and Other (See Comments)       REACTION: funny feeling      ROS As per HPI   PE: Vitals with BMI 05/05/2021 05/05/2021 04/14/2021  Height - 5\' 0"  5\' 0"   Weight - 116 lbs 10 oz 114 lbs  BMI - 54.65 68.12  Systolic 751 700 174  Diastolic 79 79 79  Pulse 82 91 88     Physical Exam Constitutional:      Appearance: Normal appearance.  Pulmonary:     Effort: Pulmonary effort is normal.  Musculoskeletal:        General: Normal range of motion.  Neurological:     Mental Status: She is alert and oriented to person, place, and time.     Cranial Nerves: No cranial nerve deficit.     Sensory: No sensory deficit.     Motor: No weakness.     Coordination: Romberg sign positive. Finger-Nose-Finger Test abnormal.     Comments: Intentional tremor of right hand  Psychiatric:        Mood and Affect: Mood normal.        Behavior: Behavior normal.      LABS:  Recent Labs       Lab Results  Component Value Date    TSH 1.08 09/25/2019      Recent Labs       Lab Results  Component Value Date    WBC 5.9 11/06/2020    HGB 12.6 11/06/2020    HCT 36.0 11/06/2020    MCV 88.7 11/06/2020    PLT 219 11/06/2020  Recent Labs       Lab Results  Component Value Date    CREATININE 0.70 02/17/2021    BUN 13 02/17/2021    NA 131 (L) 02/17/2021    K 4.3 02/17/2021    CL 97 02/17/2021    CO2 25 02/17/2021      Recent Labs       Lab Results  Component Value Date    ALT 29 06/25/2020    AST 30 06/25/2020    ALKPHOS 95 06/25/2020    BILITOT 1.0 06/25/2020      Recent Labs       Lab Results  Component Value Date    CHOL 163 01/28/2020      Recent Labs       Lab Results  Component Value Date    HDL 75 01/28/2020      Recent Labs       Lab Results  Component Value Date    LDLCALC 81 01/28/2020      Recent Labs       Lab Results  Component Value Date    TRIG 33 01/28/2020      Recent Labs       Lab Results  Component Value Date    CHOLHDL 2.2 01/28/2020      Recent Labs       Lab  Results  Component Value Date    HGBA1C 5.0 01/28/2020        IMPRESSION AND PLAN:   Mrs. Tomassi is a 75 y.o female in stable condition who presents today for chronic illness follow up.  1) Hx of hemorrhagic CVA with residual R LE spastic hemiplegia. Symptoms are well controlled. She continues to ambulate with her walker well. She receives botox injections for spasticity.  Continue to use Zanaflex bid as needed. Patient will follow up with Dr. Ephriam Knuckles about AFO causing discomfort in her foot.    2) Chronic pain syndrome: stable.  Vicodin prn, she continues uses sparingly/appropriately. Encouraged patient to take entire pill as she has only been taking them in halves.    3) HTN: controlled. Mildly elevated BP (147/84, 149/79) the past two days; however, at home BP are unremarkable. Asked the patient to continue recording at home pressures and to schedule appt sooner if pressures are consistently elevated.   4) Anxiety: was well controlled, but patient is currently grieving. Continue to take alprazolam tid prn long term. Patient is aware of signs for when medication might be needed for her grieving.   5) Chronic hyponatremia. Following Na today with CMP.   6) Osteopenia: Stable. Last DEXA 04/13/21 showed a T-score of -2.3. This retains the patient in the osteopenia category. No medication required. Vitamin D level ordered for evaluation of patients candidacy for bisphosphonate therapy.  7.) R Knee buckling: R knee seems to be stable due to non-restricted range of motion and lack of pain. Will consider imaging if problem persist.   Influenzae vaccine given today.   An After Visit Summary was printed and given to the patient.   FOLLOW UP: No follow-ups on file.  Phil Dopp - MS3   I personally was present during the history, physical exam, and medical decision-making activities of this service and have verified that the service and findings are accurately documented in the  student's note. Pt to monitor bp and send message in MyChart in 1 wk. Will discuss osteopenia/fracture risk and possible start of bisphosphonate and next RCI.  Signed:  Crissie Sickles, MD  05/06/2021       

## 2021-05-06 NOTE — Patient Instructions (Signed)
Check your blood pressure and heart rate once a day and send these numbers by MyChart message in 1 week.

## 2021-05-13 ENCOUNTER — Other Ambulatory Visit: Payer: Self-pay | Admitting: Family Medicine

## 2021-05-13 NOTE — Telephone Encounter (Signed)
Requesting: alprazolam Contract: 03/06/19 UDS:04/03/19 Last Visit: 05/06/21 Next Visit: advised to f/u 3 months Last Refill: 08/31/20(90,5)  Please Advise. Medication pending

## 2021-05-14 ENCOUNTER — Telehealth: Payer: Self-pay

## 2021-05-14 NOTE — Telephone Encounter (Signed)
I am not sure if she is calling to let us know, and I am not sure how much spasticity she had prior to the injection, but the weakness appears to be in the distrubution of the injected muscles. If the weakness is diffuse, she has changes in sensation, facial droop, etc, she should present to the ED (this does not appear to be the case). Thank you.

## 2021-05-14 NOTE — Telephone Encounter (Signed)
Patient called and stated she cannot bend her fingers and she cannot grip anything. She also stated that she cannot lift her arm from her elbow. She received Botox on 05/05/21 and it is starting to take effect. Dr. Naaman Plummer is off the grid.

## 2021-05-16 ENCOUNTER — Emergency Department (HOSPITAL_COMMUNITY)
Admission: EM | Admit: 2021-05-16 | Discharge: 2021-05-16 | Disposition: A | Discharge status: Home / Self Care | Payer: Medicare Other | Attending: Emergency Medicine | Admitting: Emergency Medicine

## 2021-05-16 ENCOUNTER — Emergency Department (HOSPITAL_COMMUNITY): Payer: Medicare Other

## 2021-05-16 ENCOUNTER — Other Ambulatory Visit: Payer: Self-pay

## 2021-05-16 DIAGNOSIS — Z0389 Encounter for observation for other suspected diseases and conditions ruled out: Secondary | ICD-10-CM | POA: Diagnosis not present

## 2021-05-16 DIAGNOSIS — I129 Hypertensive chronic kidney disease with stage 1 through stage 4 chronic kidney disease, or unspecified chronic kidney disease: Secondary | ICD-10-CM | POA: Insufficient documentation

## 2021-05-16 DIAGNOSIS — Z79899 Other long term (current) drug therapy: Secondary | ICD-10-CM | POA: Insufficient documentation

## 2021-05-16 DIAGNOSIS — N182 Chronic kidney disease, stage 2 (mild): Secondary | ICD-10-CM | POA: Insufficient documentation

## 2021-05-16 DIAGNOSIS — R531 Weakness: Secondary | ICD-10-CM | POA: Diagnosis not present

## 2021-05-16 DIAGNOSIS — R519 Headache, unspecified: Secondary | ICD-10-CM | POA: Diagnosis not present

## 2021-05-16 DIAGNOSIS — Z7982 Long term (current) use of aspirin: Secondary | ICD-10-CM | POA: Diagnosis not present

## 2021-05-16 NOTE — ED Notes (Signed)
Pt placed on cardiac monitor with BP to set cycle every 30 minutes. Continuous pulse oximeter applied.  

## 2021-05-16 NOTE — ED Provider Notes (Signed)
[facility] Provider Note   CSN: [id number] Arrival date & time: 05/16/21  2055     History Chief Complaint  Patient presents with   Arm Problem    Alison Cuevas is a 75 y.o. female.  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  She has had some muscles spasms and recently got Botox injections in her right arm.  That was done on a Wednesday about 11 days ago.  About 3 days ago she started with weakness in that arm and poor coordination.  She spoke to the office where she got the injections and I thought it was related to the Botox.  The history is provided by the patient. No language interpreter was used.  Weakness Severity:  Moderate Onset quality:  Sudden Duration:  2 days Timing:  Constant Progression:  Unchanged Chronicity:  New Context: not alcohol use   Relieved by:  Nothing Worsened by:  Nothing Associated symptoms: no abdominal pain, no chest pain, no cough, no diarrhea, no frequency, no headaches and no seizures       Past Medical History:  Diagnosis Date   Allergic rhinitis    maple pollen   Anxiety    Arthritis    Bowel obstruction (HCC)    Chronic low back pain    Dr. Trenton Gammon.  Has had back injection--bp elevated after.   Chronic pain syndrome    Chronic renal insufficiency, stage 2 (mild)    GFR 60-70   DDD (degenerative disc disease), cervical    Hx of ACDF (Dr. [name]).  Followed by Dr. [name].  Also, Dr. [name] do left C7-T1 intralaminal epidural injection.   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  Resid R arm and leg weakness + flexion contractures.   Hypercholesteremia    mild; pt declined statin trial 05/2014--needs recheck lipid panel at first  f/u visit in 2017   Hypertension    Osteopenia 06/2017   T score -1.4 FRAX 15% / 2%   Ovarian cyst 2017   82019-robotic assisted bilatel SPO: all path benign.   Perianal dermatitis    prn cutivate   Phlebitis    Right hemiplegia ([place]) 01/2020   R arm and leg (hemorrhagic L cerebral hemisphere CVA)   Right knee injury 2018   Patellofemoral crush injury--Dr. [name].   Stroke [facility])    Trochanteric bursitis of right hip    Recurrent (injection by Dr. 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 [name]). Recurrent fungal/bact OE.   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   Chronic otitis externa of right ear 02/20/2020   Intraparenchymal hemorrhage of brain ([place])    Delirium    ICH (intracerebral hemorrhage) ([place]) - L frontoparietal, hypertensive 01/26/2020   Stroke (cerebrum) ([place]) 01/26/2020   Conductive hearing loss of right ear with unrestricted hearing of left ear 05/28/2019   Hyponatremia 08/31/2017   Generalized weakness 08/31/2017   Dehydration 08/31/2017  UTI (urinary tract infection) 08/31/2017   Tympanic membrane rupture, right 12/01/2016   Right ovarian cyst 08/22/2016   Fibromyalgia 06/03/2015   Cystocele 03/18/2014   Tick-borne disease 12/25/2013   Diverticulosis of colon without hemorrhage 06/12/2013   Hypertension    Osteopenia    Chronic low back pain 05/12/2011   HYPERCHOLESTEROLEMIA 03/17/2008   Anxiety state 09/17/2007   GERD 09/17/2007   DJD (degenerative joint disease) 09/17/2007    Past Surgical History:  Procedure Laterality Date   ABDOMINAL HYSTERECTOMY  03/2015   TAH.  Last pap 2016.  No hx of abnl paps.  Per GYN, no further pap smears indicated.   ANTERIOR AND POSTERIOR REPAIR N/A  03/18/2014   Procedure: Cystocele repair with graft, Vault suspension, Rectocele repair;  Surgeon: [name], MD;  Location: [facility];  Service: Urology;  Laterality: N/A;   CHOLECYSTECTOMY     CHOLECYSTECTOMY, LAPAROSCOPIC     COLON SURGERY     COLONOSCOPY  04/2006; 05/26/16   2007 (Dr. [name]): Normal.  04/2016 (Dr. [name]) normal except diverticulosis and decreased anal sphincter tone.  No repeat colonoscopy is recommended due to age.   cspine surgery     Dr. Wiliam Ke level ant cerv discectomy [name] w/plating   CYSTO N/A 03/18/2014   Procedure: CYSTO;  Surgeon: [name], MD;  Location: [facility];  Service: Urology;  Laterality: N/A;   DEXA  07/30/2015; 06/2018   2017 and 2020 -->Osteopenia--repeat 2 yrs. T score -2.29 Mar 2021. Rpt 2 yrs.   LYSIS OF ADHESION N/A 01/31/2018   Procedure: POSSIBLE LYSIS OF ADHESION;  Surgeon: Everitt Amber, MD;  Location: [facility];  Service: Gynecology;  Laterality: N/A;   [place]     with prolasped bledder repair   RESECTION OF COLON     BENIGN TUMOR   RIGHT EAR SURGERY  08/23/2017   Dr. 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  Procedure: XI ROBOTIC ASSISTED BILATERAL SALPINGO OOPHORECTOMY;  Surgeon: Everitt Amber, MD;  Location: [facility];  Service: Gynecology;  Laterality: Bilateral;   TRANSTHORACIC ECHOCARDIOGRAM  01/27/2020   EF 65-70%, NORMAL.   TUBAL LIGATION       OB History     Gravida  4   Para  3   Term      Preterm      AB  1    Living  3      SAB      IAB      Ectopic      Multiple      Live Births              Family History  Problem Relation Age of Onset   Hypertension Mother    Diverticulitis Mother    Breast cancer Sister 56   Melanoma Brother    Prostate cancer Brother    Leukemia Brother    Lupus Sister    Lung disease Brother    Stomach cancer Father    Other Other        Family member with  MGUS    Social History   Tobacco Use   Smoking status: Never   Smokeless tobacco: Never  Vaping Use   Vaping Use: Never used  Substance Use Topics   Alcohol use: No    Alcohol/week: 0.0 standard drinks   Drug use: No    Home Medications Prior to Admission medications   Medication Sig Start Date End Date Taking? Authorizing Provider  acetaminophen (TYLENOL) 500 MG tablet Take 1 tablet (500 mg total) by mouth 3 (three) times daily. 02/19/20   Love, Ivan Anchors, PA-C  ALPRAZolam (XANAX) 0.5 MG tablet TAKE 1 TABLET BY MOUTH THREE TIMES A DAY AS NEEDED FOR ANXIETY 05/13/21   McGowen, Adrian Blackwater, MD  aspirin EC 81 MG tablet Take 1 tablet (81 mg total) by mouth daily. Swallow whole. 04/20/20   Frann Rider, NP  Biotin 5 MG TABS Take 5 mg by mouth daily.    [provider]  fluticasone (FLONASE) 50 MCG/ACT nasal spray Place 2 sprays into both nostrils at bedtime. 05/17/16   McGowen, Adrian Blackwater, MD  HYDROcodone-acetaminophen (NORCO/VICODIN) 5-325 MG tablet 1/2-1 tab po q6h prn pain 02/12/21   McGowen, Adrian Blackwater, MD  ibuprofen (ADVIL) 200 MG tablet Take 200 mg by mouth every 6 (six) hours as needed.    [provider]  KLOR-CON M20 20 MEQ tablet TAKE 1 TABLET BY MOUTH EVERY DAY 04/23/21   McGowen, Adrian Blackwater, MD  lisinopril (ZESTRIL) 5 MG tablet TAKE 1 TABLET BY MOUTH EVERY DAY 04/23/21   McGowen, Adrian Blackwater, MD  loratadine (CLARITIN) 10 MG tablet Take 10 mg by mouth every other day.     [provider]  meclizine (ANTIVERT) 12.5 MG tablet Take 1 tablet (12.5 mg total) by mouth 3  (three) times daily. Patient taking differently: Take 12.5 mg by mouth. Takes at night 02/19/20   Love, Ivan Anchors, PA-C  Multiple Minerals-Vitamins (CALCIUM-MAGNESIUM-ZINC-D3 PO) Take 1 tablet by mouth daily.     [provider]  nystatin ointment (MYCOSTATIN) Apply 1 application topically 2 (two) times daily. Apply to affected area for up to 7 days. 10/20/20   Karma Ganja, NP  pantoprazole (PROTONIX) 40 MG tablet TAKE 1 TABLET BY MOUTH EVERYDAY AT BEDTIME 05/04/21   McGowen, Adrian Blackwater, MD  polyethylene glycol (MIRALAX / GLYCOLAX) 17 g packet Take 17 g by mouth daily as needed.    [provider]  Sodium Fluoride (CLINPRO 5000) 1.1 % PSTE Clinpro 5000 1.1 % dental paste    [provider]  tiZANidine (ZANAFLEX) 2 MG tablet TAKE 1 TABLET BY MOUTH 3 TIMES DAILY AS NEEDED FOR MUSCLE RELAXATION 03/29/21   McGowen, Adrian Blackwater, MD  triamcinolone ointment (KENALOG) 0.1 % triamcinolone acetonide 0.1 % topical ointment    [provider]  verapamil (CALAN-SR) 180 MG CR tablet Take 1 tablet (180 mg total) by mouth daily. 05/06/20   McGowen, Adrian Blackwater, MD    Allergies    Gabapentin and Prednisone  Review of Systems   Review of Systems  Constitutional:  Negative for appetite change and fatigue.  HENT:  Negative for congestion, ear discharge and sinus pressure.   Eyes:  Negative for discharge.  Respiratory:  Negative for cough.   Cardiovascular:  Negative for chest pain.  Gastrointestinal:  Negative for abdominal pain and diarrhea.  Genitourinary:  Negative for frequency and hematuria.  Musculoskeletal:  Negative for back pain.  Skin:  Negative for rash.  Neurological:  Positive for weakness. Negative for seizures and headaches.  Right arm weakness  Psychiatric/Behavioral:  Negative for hallucinations.    Physical Exam Updated Vital Signs BP (!) 144/70   Pulse 86   Resp 17   Wt 51.3 kg   LMP 06/27/1972   SpO2 97%   BMI 22.07 kg/m   Physical Exam Vitals  and nursing note reviewed.  Constitutional:      Appearance: She is well-developed.  HENT:     Head: Normocephalic.     Nose: Nose normal.  Eyes:     General: No scleral icterus.    Conjunctiva/sclera: Conjunctivae normal.  Neck:     Thyroid: No thyromegaly.  Cardiovascular:     Rate and Rhythm: Normal rate and regular rhythm.     Heart sounds: No murmur heard.   No friction rub. No gallop.  Pulmonary:     Breath sounds: No stridor. No wheezing or rales.  Chest:     Chest wall: No tenderness.  Abdominal:     General: There is no distension.     Tenderness: There is no abdominal tenderness. There is no rebound.  Musculoskeletal:     Cervical back: Neck supple.     Comments: Weakness in right arm with poor coordination  Lymphadenopathy:     Cervical: No cervical adenopathy.  Skin:    Findings: No erythema or rash.  Neurological:     Mental Status: She is alert and oriented to person, place, and time.     Motor: No abnormal muscle tone.     Coordination: Coordination normal.  Psychiatric:        Behavior: Behavior normal.    ED Results / Procedures / Treatments   Labs (all labs ordered are listed, but only abnormal results are displayed) Labs Reviewed - No data to display  EKG None  Radiology CT Head Wo Contrast  Result Date: 05/16/2021 CLINICAL DATA:  Headache and right arm numbness. Recent Botox injection for spasticity. EXAM: CT HEAD WITHOUT CONTRAST CT CERVICAL SPINE WITHOUT CONTRAST TECHNIQUE: Multidetector CT imaging of the head and cervical spine was performed following the standard protocol without intravenous contrast. Multiplanar CT image reconstructions of the cervical spine were also generated. COMPARISON:  CT scan head 01/26/2020, CT angiography of the neck and reconstructions 01/26/2020, MRI brain 04/16/2020, head CT 01/20/2021. FINDINGS: CT HEAD FINDINGS Brain: There is encephalomalacia in the parasagittal left high parietal lobe in the location of the  previous hemorrhage. No recent or new hemorrhage is seen. There is mild atrophy and atrophic ventriculomegaly and moderately advanced small vessel disease the cerebral white matter, with small chronic lacunar infarct again in the right cerebellar hemisphere posteriorly. There is no evidence of acute cortical based infarct, hemorrhage, mass or mass effect, or midline shift. Vascular: There are calcifications of the carotid siphons but no hyperdense central vasculature. Skull: No fracture or focal lesions. Orbits and orbital contents are unremarkable. Sinuses/Orbits: There is chronic opacification and hyperostosis of the left sphenoid air cell consistent with chronic sinusitis, other visualized sinuses are clear with patchy fluid again in the right mastoid air cells and old right mastoidectomy defect. Left mastoids are clear. Other: None. CT CERVICAL SPINE FINDINGS Alignment: Stable. Again noted is a minimal grade 1 C2-3 retrolisthesis, 3 mm chronic grade 1 C4-5 anterolisthesis with solid surgical fusion as before, and otherwise normal alignment. Skull base and vertebrae: Osteopenia. No fracture is evident. There is anterior plating hardware with interbody bone plugs and solid fusions C3-6, including with fusion across the facet joints at these levels. Advanced joint  space loss with osteophytosis is again seen at the anterior atlantodental joint. There is mild disc space loss at C2-3 and advanced disc collapse again at C6-7, with bidirectional osteophytes at C6-7. 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 Soft tissues and spinal canal: No prevertebral fluid or swelling. No visible canal hematoma. An enlarging nodule of the right lobe of the thyroid gland now measures 1.8 cm previously 1 cm. There are trace calcifications of the carotid bifurcations Disc levels: At C2-3 there is a mild posterior disc osteophyte complex effacing the ventral CSF without mass effect on the cord.  Uncinate spurring is seen with mild-to-moderate right and mild left foraminal stenosis. From C3-6 there are solid fusions without significant soft tissue or bony encroachment on the thecal sac with mild posterior ridging at the level of the fused discs. The foramina are clear. At C6-7, posterior disc osteophyte complex again causes mild spinal canal stenosis and mild deformity of the ventral cord surface but does not appear to cause frank cord compression. Uncinate spurs are seen but only mild foraminal stenosis. Upper chest: There is chronic pleural-parenchymal scarring at the lung apices. Other: None. IMPRESSION: 1. No acute intracranial CT findings. Encephalomalacia is now seen in the location of the prior parasagittal left high parietal hematoma. There is atrophy and small-vessel disease. 2. Degenerative and mature postsurgical changes of the cervical spine with osteopenia. No levels demonstrate critical stenosis of the spinal canal and foramina. There are solid chronic fusions again seen at C3-6, with stable overall alignment. No evidence of fractures. 3. Enlarging right thyroid hypodense mass now 1.8 cm. Ultrasound follow-up recommended. 4. Old right mastoidectomy with chronic right mastoid effusion, chronic left sphenoid sinus opacification. Electronically Signed   By: Telford Nab M.D.   On: 05/16/2021 22:25   CT Cervical Spine Wo Contrast  Result Date: 05/16/2021 CLINICAL DATA:  Headache and right arm numbness. Recent Botox injection for spasticity. EXAM: CT HEAD WITHOUT CONTRAST CT CERVICAL SPINE WITHOUT CONTRAST TECHNIQUE: Multidetector CT imaging of the head and cervical spine was performed following the standard protocol without intravenous contrast. Multiplanar CT image reconstructions of the cervical spine were also generated. COMPARISON:  CT scan head 01/26/2020, CT angiography of the neck and reconstructions 01/26/2020, MRI brain 04/16/2020, head CT 01/20/2021. FINDINGS: CT HEAD FINDINGS  Brain: There is encephalomalacia in the parasagittal left high parietal lobe in the location of the previous hemorrhage. No recent or new hemorrhage is seen. There is mild atrophy and atrophic ventriculomegaly and moderately advanced small vessel disease the cerebral white matter, with small chronic lacunar infarct again in the right cerebellar hemisphere posteriorly. There is no evidence of acute cortical based infarct, hemorrhage, mass or mass effect, or midline shift. Vascular: There are calcifications of the carotid siphons but no hyperdense central vasculature. Skull: No fracture or focal lesions. Orbits and orbital contents are unremarkable. Sinuses/Orbits: There is chronic opacification and hyperostosis of the left sphenoid air cell consistent with chronic sinusitis, other visualized sinuses are clear with patchy fluid again in the right mastoid air cells and old right mastoidectomy defect. Left mastoids are clear. Other: None. CT CERVICAL SPINE FINDINGS Alignment: Stable. Again noted is a minimal grade 1 C2-3 retrolisthesis, 3 mm chronic grade 1 C4-5 anterolisthesis with solid surgical fusion as before, and otherwise normal alignment. Skull base and vertebrae: Osteopenia. No fracture is evident. There is anterior plating hardware with interbody bone plugs and solid fusions C3-6, including with fusion across the facet joints at  these levels. Advanced joint space loss with osteophytosis is again seen at the anterior atlantodental joint. There is mild disc space loss at C2-3 and advanced disc collapse again at C6-7, with bidirectional osteophytes at C6-7. 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 Soft tissues and spinal canal: No prevertebral fluid or swelling. No visible canal hematoma. An enlarging nodule of the right lobe of the thyroid gland now measures 1.8 cm previously 1 cm. There are trace calcifications of the carotid bifurcations Disc levels: At C2-3 there is a  mild posterior disc osteophyte complex effacing the ventral CSF without mass effect on the cord. Uncinate spurring is seen with mild-to-moderate right and mild left foraminal stenosis. From C3-6 there are solid fusions without significant soft tissue or bony encroachment on the thecal sac with mild posterior ridging at the level of the fused discs. The foramina are clear. At C6-7, posterior disc osteophyte complex again causes mild spinal canal stenosis and mild deformity of the ventral cord surface but does not appear to cause frank cord compression. Uncinate spurs are seen but only mild foraminal stenosis. Upper chest: There is chronic pleural-parenchymal scarring at the lung apices. Other: None. IMPRESSION: 1. No acute intracranial CT findings. Encephalomalacia is now seen in the location of the prior parasagittal left high parietal hematoma. There is atrophy and small-vessel disease. 2. Degenerative and mature postsurgical changes of the cervical spine with osteopenia. No levels demonstrate critical stenosis of the spinal canal and foramina. There are solid chronic fusions again seen at C3-6, with stable overall alignment. No evidence of fractures. 3. Enlarging right thyroid hypodense mass now 1.8 cm. Ultrasound follow-up recommended. 4. Old right mastoidectomy with chronic right mastoid effusion, chronic left sphenoid sinus opacification. Electronically Signed   By: Telford Nab M.D.   On: 05/16/2021 22:25    Procedures Procedures   Medications Ordered in ED Medications - No data to display  ED Course  I have reviewed the triage vital signs and the nursing notes.  Pertinent labs & imaging results that were available during my care of the patient were reviewed by me and considered in my medical decision making (see chart for details).  Patient with worsening coordination in her right arm.  CT scan is negative.  I spoke to neurology and they felt like the rest of her work-up could be done as an  outpatient. MDM Rules/Calculators/A&P                           Patient with poor coordination in her right arm.  CT head negative.  Patient will follow up with her neurologist.  Also incidentally her CT showed a thyroid nodule which she will follow-up with her primary care doctor about Final Clinical Impression(s) / ED Diagnoses Final diagnoses:  Weakness    Rx / DC Orders ED Discharge Orders     None        Milton Ferguson, MD 05/17/21 1039

## 2021-05-16 NOTE — ED Triage Notes (Signed)
Right arm numbness onset 3 days ago. Pt had botox injections in right arm 10 days ago for muscle spasticity. Pt talked to nurse at the office and they said numbness may be due to the injections. Pt noticed that she is having more difficulty gripping with her right hand today. Pt is accompanied by her daughter.

## 2021-05-16 NOTE — Discharge Instructions (Signed)
Follow-up with your neurologist this week for recheck.  And follow-up with your family doctor about your thyroid nodule.

## 2021-05-17 ENCOUNTER — Encounter: Payer: Self-pay | Admitting: Family Medicine

## 2021-05-17 ENCOUNTER — Telehealth: Payer: Self-pay | Admitting: *Deleted

## 2021-05-17 ENCOUNTER — Telehealth: Payer: Self-pay

## 2021-05-17 ENCOUNTER — Encounter: Payer: Self-pay | Admitting: Adult Health

## 2021-05-17 DIAGNOSIS — E041 Nontoxic single thyroid nodule: Secondary | ICD-10-CM

## 2021-05-17 NOTE — Telephone Encounter (Signed)
FYI. Please see below. Pt's neurologist has also been made aware.

## 2021-05-17 NOTE — Telephone Encounter (Signed)
Patient states she had Botox last week and now she is having problems with her arm. Arm spasms, no control and no grip in hand. Patient states the no grip in hand is new because she was tieing her on shoe. She takes she did go to ED. She is wanting to know is the Botox was the cause of it.

## 2021-05-17 NOTE — Telephone Encounter (Signed)
OK.  Thyroid nodule needs further evaluation to characterize it better. Ultrasound is the best imaging modality for the thyroid. I ordered ultrasound of thyroid for [facility] radiology so they should be calling her to schedule it. Pls give pt their number so she can call them if she has not been contacted in the next couple of days.

## 2021-05-17 NOTE — Telephone Encounter (Signed)
Received my chart from son:  Mom went to the ER last night with headache and loss of movement in right arm, particularly the hand and fingers, and has loss some movement in her right leg. She was told to follow-up with her neurologist. Please call mom at [phone] for information about last night's visit and if she needs to do anything further. If she needs to come into your office, [place] would work best for an appointment  Called patient who stated she has contacted Dr Naaman Plummer office where she had Botox done, is waiting for a call back. She stated she cannot close her fingers.  I advised will let Janett Billow NP know of her ED visit and will call her back with NP's advice. Patient verbalized understanding, appreciation.

## 2021-05-17 NOTE — Telephone Encounter (Signed)
Message sent to Dr. Naaman Plummer for advisement

## 2021-05-17 NOTE — Telephone Encounter (Signed)
As CT head negative, I would first ensure they discuss with PMR to see if this could be potentially due to Botox injection and then we can go from there.

## 2021-05-18 ENCOUNTER — Ambulatory Visit: Payer: Medicare Other | Admitting: Family Medicine

## 2021-05-18 DIAGNOSIS — Z9889 Other specified postprocedural states: Secondary | ICD-10-CM | POA: Diagnosis not present

## 2021-05-18 DIAGNOSIS — H90A22 Sensorineural hearing loss, unilateral, left ear, with restricted hearing on the contralateral side: Secondary | ICD-10-CM | POA: Diagnosis not present

## 2021-05-18 DIAGNOSIS — H90A31 Mixed conductive and sensorineural hearing loss, unilateral, right ear with restricted hearing on the contralateral side: Secondary | ICD-10-CM | POA: Diagnosis not present

## 2021-05-18 DIAGNOSIS — H9042 Sensorineural hearing loss, unilateral, left ear, with unrestricted hearing on the contralateral side: Secondary | ICD-10-CM | POA: Diagnosis not present

## 2021-05-18 DIAGNOSIS — H9071 Mixed conductive and sensorineural hearing loss, unilateral, right ear, with unrestricted hearing on the contralateral side: Secondary | ICD-10-CM | POA: Diagnosis not present

## 2021-05-18 DIAGNOSIS — Z9629 Presence of other otological and audiological implants: Secondary | ICD-10-CM | POA: Diagnosis not present

## 2021-05-18 DIAGNOSIS — H7291 Unspecified perforation of tympanic membrane, right ear: Secondary | ICD-10-CM | POA: Diagnosis not present

## 2021-05-18 NOTE — Telephone Encounter (Signed)
I spoke to Mrs Boggio and described the effects, duration of botox. Made recommendations

## 2021-05-19 NOTE — Telephone Encounter (Signed)
Noted! Thank you

## 2021-05-19 NOTE — Telephone Encounter (Signed)
I called the pt back. She sts she has discussed with Dr. Naaman Plummer and was advised the s/x were likely related to her botox. Pt was advised by Dr. Naaman Plummer to continue with her exercises and muscle relaxer. Pt reports she is doing well and will call back if any other issues come up.

## 2021-05-27 DIAGNOSIS — E041 Nontoxic single thyroid nodule: Secondary | ICD-10-CM

## 2021-05-27 HISTORY — DX: Nontoxic single thyroid nodule: E04.1

## 2021-05-27 NOTE — Telephone Encounter (Signed)
FYI  Please see below

## 2021-06-01 ENCOUNTER — Other Ambulatory Visit: Payer: Self-pay

## 2021-06-01 ENCOUNTER — Ambulatory Visit (HOSPITAL_COMMUNITY)
Admission: RE | Admit: 2021-06-01 | Discharge: 2021-06-01 | Disposition: A | Discharge status: Home / Self Care | Payer: Medicare Other | Source: Ambulatory Visit | Attending: Family Medicine | Admitting: Family Medicine

## 2021-06-01 DIAGNOSIS — E041 Nontoxic single thyroid nodule: Secondary | ICD-10-CM | POA: Diagnosis not present

## 2021-06-02 ENCOUNTER — Telehealth: Payer: Self-pay

## 2021-06-02 ENCOUNTER — Encounter: Payer: Self-pay | Admitting: Family Medicine

## 2021-06-02 DIAGNOSIS — E041 Nontoxic single thyroid nodule: Secondary | ICD-10-CM

## 2021-06-02 NOTE — Telephone Encounter (Signed)
Spoke with pt's son, Charles(DPR) regarding results/recommendations,voiced understanding. Referral placed

## 2021-06-02 NOTE — Telephone Encounter (Signed)
-----   Message from Alison Sou, MD sent at 06/02/2021 10:26 AM EST ----- She has a thyroid nodule that has some atypical features and needs to be biopsied. I recommend she see a thyroid specialist---pls refer to endocrinology, Dr. Dorris Fetch in [place], dx thyroid nodule.

## 2021-06-03 NOTE — Telephone Encounter (Signed)
These blood pressures and heart rates are good. No changes.

## 2021-06-26 ENCOUNTER — Other Ambulatory Visit: Payer: Self-pay | Admitting: Family Medicine

## 2021-06-27 HISTORY — PX: BIOPSY THYROID: PRO38

## 2021-07-08 ENCOUNTER — Ambulatory Visit (INDEPENDENT_AMBULATORY_CARE_PROVIDER_SITE_OTHER): Payer: Medicare Other | Admitting: "Endocrinology

## 2021-07-08 ENCOUNTER — Other Ambulatory Visit: Payer: Self-pay

## 2021-07-08 ENCOUNTER — Encounter: Payer: Self-pay | Admitting: "Endocrinology

## 2021-07-08 VITALS — BP 122/60 | HR 72 | Ht 60.0 in

## 2021-07-08 DIAGNOSIS — E041 Nontoxic single thyroid nodule: Secondary | ICD-10-CM | POA: Diagnosis not present

## 2021-07-08 NOTE — Progress Notes (Signed)
Endocrinology Consult Note                                            07/08/2021, 4:36 PM   Subjective:    Patient ID: Alison Cuevas, female    DOB: [birth year], PCP [name], MD   Past Medical History:  Diagnosis Date   Allergic rhinitis    maple pollen   Anxiety    Arthritis    Bowel obstruction (HCC)    Chronic low back pain    Dr. Trenton Gammon.  Has had back injection--bp elevated after.   Chronic pain syndrome    Chronic renal insufficiency, stage 2 (mild)    GFR 60-70   DDD (degenerative disc disease), cervical    Hx of ACDF (Dr. [name]).  Followed by Dr. [name].  Also, Dr. [name] do left C7-T1 intralaminal epidural injection.   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  Resid R arm and leg weakness + flexion contractures.   Hypercholesteremia    mild; pt declined statin trial 05/2014--needs recheck lipid panel at first f/u visit in 2017   Hypertension    Osteopenia 06/2017   T score -1.4 FRAX 15% / 2%   Ovarian cyst 2017   82019-robotic assisted bilatel SPO: all path benign.   Perianal dermatitis    prn cutivate   Phlebitis    Right hemiplegia ([place]) 01/2020   R arm and leg (hemorrhagic L cerebral hemisphere CVA)   Right knee injury 2018   Patellofemoral crush injury--Dr. [name].   Solitary thyroid nodule 05/2021   needs bx->plan endo ref   Stroke [facility])    Trochanteric bursitis of right hip    Recurrent (injection by Dr. 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 [name]). Recurrent fungal/bact OE.   Varicose vein    left leg    Past Surgical History:  Procedure Laterality Date   ABDOMINAL HYSTERECTOMY  03/2015   TAH.  Last pap  2016.  No hx of abnl paps.  Per GYN, no further pap smears indicated.   ANTERIOR AND POSTERIOR REPAIR N/A 03/18/2014   Procedure: Cystocele repair with graft, Vault suspension, Rectocele repair;  Surgeon: [name], MD;  Location: [facility];  Service: Urology;  Laterality: N/A;   CHOLECYSTECTOMY     CHOLECYSTECTOMY, LAPAROSCOPIC     COLON SURGERY     COLONOSCOPY  04/2006; 05/26/16   2007 (Dr. [name]): Normal.  04/2016 (Dr. [name]) normal except diverticulosis and decreased anal sphincter tone.  No repeat colonoscopy is recommended due to age.   cspine surgery     Dr. Wiliam Ke level ant cerv discectomy [name] w/plating   CYSTO N/A 03/18/2014   Procedure: CYSTO;  Surgeon: [name], MD;  Location: [facility];  Service: Urology;  Laterality: N/A;   DEXA  07/30/2015; 06/2018   2017 and 2020 -->Osteopenia--repeat 2 yrs. T score -2.29 Mar 2021. Rpt 2 yrs.   LYSIS OF ADHESION N/A 01/31/2018   Procedure: POSSIBLE LYSIS OF ADHESION;  Surgeon: Everitt Amber, MD;  Location: [facility];  Service: Gynecology;  Laterality: N/A;   [place]     with prolasped bledder repair   RESECTION OF COLON     BENIGN TUMOR   RIGHT EAR SURGERY  08/23/2017   Dr. 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  Procedure: XI ROBOTIC ASSISTED BILATERAL SALPINGO OOPHORECTOMY;  Surgeon: Everitt Amber, MD;  Location: [facility];  Service: Gynecology;  Laterality: Bilateral;   TRANSTHORACIC ECHOCARDIOGRAM  01/27/2020   EF 65-70%, NORMAL.    TUBAL LIGATION     Social History   Socioeconomic History   Marital status: Widowed    Spouse name: Alison Cuevas   Number of children: 3   Years of education: Not on file   Highest education level: 9th grade  Occupational History   Occupation: retired  Tobacco Use   Smoking status: Never   Smokeless tobacco: Never  Vaping Use   Vaping Use: Never used  Substance and Sexual Activity   Alcohol use: No    Alcohol/week: 0.0 standard drinks   Drug use: No   Sexual activity: Not Currently    Birth control/protection: Surgical    Comment: hysterectomy  Other Topics Concern   Not on file  Social History Narrative   Widow (2022), has 3 children (one lives near her).  Has 8 grandchildren, 2 greatgrandchildren.  Worked cleaning apartments, worked at [employer], was a Secretary/administrator.She retired at age 60.No tobacco.  No alcohol.  No drugs.Exercise: clean, work in yard.  10 siblings in all--5 have passed away--1 child age 105 with whooping cough , 1 lupus, 1 melanoma,1 lung cancer, 1 pulm fibrosis   Social Determinants of Health   Financial Resource Strain: Low Risk    Difficulty of Paying Living Expenses: Not hard at all  Food Insecurity: No Food Insecurity   Worried About Charity fundraiser in the Last Year: Never true   Ran Out of Food in the Last Year: Never true  Transportation Needs: No Transportation Needs   Lack of Transportation (Medical): No   Lack of Transportation (Non-Medical): No  Physical Activity: Sufficiently Active   Days of Exercise per Week: 7 days   Minutes of Exercise per Session: 90 min  Stress: Stress Concern Present   Feeling of Stress : To some extent  Social Connections: Moderately Isolated   Frequency of Communication with Friends and Family: More than three times a week   Frequency of Social Gatherings with Friends and Family: More than three times a week   Attends Religious Services: More than 4 times per year   Active Member of [employer] or  Organizations: No   Attends Archivist Meetings: Not on file   Marital Status: Widowed   Family History  Problem Relation Age of Onset   Thyroid disease Mother    Hypertension Mother    Diverticulitis Mother    Stomach cancer Father    Heart attack Father    Heart failure Father    Breast cancer Sister 58   Lupus Sister    Melanoma Brother    Prostate cancer  Brother    Leukemia Brother    Lung disease Brother    Other Other        Family member with MGUS   Outpatient Encounter Medications as of 07/08/2021  Medication Sig   acetaminophen (TYLENOL) 500 MG tablet Take 1 tablet (500 mg total) by mouth 3 (three) times daily.   ALPRAZolam (XANAX) 0.5 MG tablet TAKE 1 TABLET BY MOUTH THREE TIMES A DAY AS NEEDED FOR ANXIETY   aspirin EC 81 MG tablet Take 1 tablet (81 mg total) by mouth daily. Swallow whole.   Biotin 5 MG TABS Take 5 mg by mouth daily.   fluticasone (FLONASE) 50 MCG/ACT nasal spray Place 2 sprays into both nostrils at bedtime.   HYDROcodone-acetaminophen (NORCO/VICODIN) 5-325 MG tablet 1/2-1 tab po q6h prn pain   ibuprofen (ADVIL) 200 MG tablet Take 200 mg by mouth every 6 (six) hours as needed.   KLOR-CON M20 20 MEQ tablet TAKE 1 TABLET BY MOUTH EVERY DAY   lisinopril (ZESTRIL) 5 MG tablet TAKE 1 TABLET BY MOUTH EVERY DAY   loratadine (CLARITIN) 10 MG tablet Take 10 mg by mouth every other day.    meclizine (ANTIVERT) 12.5 MG tablet Take 1 tablet (12.5 mg total) by mouth 3 (three) times daily. (Patient taking differently: Take 12.5 mg by mouth. Takes at night)   Multiple Minerals-Vitamins (CALCIUM-MAGNESIUM-ZINC-D3 PO) Take 1 tablet by mouth daily.    nystatin ointment (MYCOSTATIN) Apply 1 application topically 2 (two) times daily. Apply to affected area for up to 7 days.   pantoprazole (PROTONIX) 40 MG tablet TAKE 1 TABLET BY MOUTH EVERYDAY AT BEDTIME   polyethylene glycol (MIRALAX / GLYCOLAX) 17 g packet Take 17 g by mouth daily as needed.   Sodium Fluoride  (CLINPRO 5000) 1.1 % PSTE Clinpro 5000 1.1 % dental paste   tiZANidine (ZANAFLEX) 2 MG tablet TAKE 1 TABLET BY MOUTH 3 TIMES DAILY AS NEEDED FOR MUSCLE RELAXATION   triamcinolone ointment (KENALOG) 0.1 % triamcinolone acetonide 0.1 % topical ointment   verapamil (CALAN-SR) 180 MG CR tablet Take 1 tablet (180 mg total) by mouth daily.   No facility-administered encounter medications on file as of 07/08/2021.   ALLERGIES: Allergies  Allergen Reactions   Gabapentin Anxiety    Elevated heart rate and crazy dreams   Prednisone Anxiety and Other (See Comments)    REACTION: funny feeling    VACCINATION STATUS: Immunization History  Administered Date(s) Administered   Fluad Quad(high Dose 65+) 04/03/2019, 05/06/2021   Influenza Split 04/27/2013   Influenza, High Dose Seasonal PF 04/27/2020   Influenza-Unspecified 03/30/2015, 03/30/2018   Moderna Sars-Covid-2 Vaccination 08/01/2019, 08/30/2019   Pneumococcal Conjugate-13 12/23/2014   Pneumococcal Polysaccharide-23 05/24/2010, 11/16/2010   Tdap 09/29/2014, 12/17/2016   Zoster, Live 11/18/2014    HPI Laquanna Veazey is 76 y.o. female who presents today with a medical history as above. she is being seen in consultation for thyroid nodule requested by McGowen, Adrian Blackwater, MD.  She is accompanied by her friend to the clinic.  History is obtained directly from the patient as well as chart review.  While undergoing work-up for stroke, she was found to have 1.7 cm nodule on the right lobe of her thyroid on June 01, 2021.  She denies any prior knowledge of thyroid dysfunction.  She is not on any antithyroid medications nor  thyroid hormone supplements. Patient denies dysphagia, shortness of breath, nor voice change.  She denies any family history of thyroid malignancy although she has various thyroid  dysfunction in her family.  Her most recent thyroid function tests were within normal limits. She is bound to a walker at baseline due to right-sided  hemiparesis.    Review of Systems  Constitutional: + Minimally fluctuating body weight, no fatigue, no subjective hyperthermia, no subjective hypothermia Eyes: no blurry vision, no xerophthalmia ENT: no sore throat, no nodules palpated in throat, no dysphagia/odynophagia, no hoarseness Cardiovascular: no Chest Pain, no Shortness of Breath, no palpitations, no leg swelling Respiratory: no cough, no shortness of breath Gastrointestinal: no Nausea/Vomiting/Diarhhea Musculoskeletal: + Bound to a walker, no muscle/joint aches Skin: no rashes Neurological: no tremors, no numbness, no tingling, no dizziness Psychiatric: no depression, no anxiety  Objective:    Vitals with BMI 07/08/2021 05/16/2021 05/16/2021  Height 5\' 0"  - -  Weight - - -  BMI - - -  Systolic 097 353 299  Diastolic 60 72 70  Pulse 72 86 86    BP 122/60    Pulse 72    Ht 5' (1.524 m)    LMP 06/27/1972    BMI 22.07 kg/m   Wt Readings from Last 3 Encounters:  05/16/21 113 lb (51.3 kg)  05/06/21 114 lb 3.2 oz (51.8 kg)  05/05/21 116 lb 9.6 oz (52.9 kg)    Physical Exam  Constitutional:  Body mass index is 22.07 kg/m.,  not in acute distress, normal state of mind Eyes: PERRLA, EOMI, no exophthalmos ENT: moist mucous membranes, no gross thyromegaly, no gross cervical lymphadenopathy Cardiovascular: normal precordial activity, Regular Rate and Rhythm, no Murmur/Rubs/Gallops Respiratory:  adequate breathing efforts, no gross chest deformity, Clear to auscultation bilaterally Gastrointestinal: abdomen soft, Non -tender, No distension, Bowel Sounds present, no gross organomegaly Musculoskeletal: + bound to a walker, no gross deformities, strength intact in all four extremities Skin: moist, warm, no rashes Neurological: no tremor with outstretched hands, Deep tendon reflexes normal in bilateral lower extremities + motor function 3 out of 5 on upper right extremity  CMP ( most recent) CMP     Component Value Date/Time    NA 133 (L) 05/06/2021 1408   K 4.0 05/06/2021 1408   CL 98 05/06/2021 1408   CO2 27 05/06/2021 1408   GLUCOSE 83 05/06/2021 1408   BUN 12 05/06/2021 1408   CREATININE 0.67 05/06/2021 1408   CREATININE 0.67 11/06/2020 1640   CALCIUM 9.5 05/06/2021 1408   PROT 6.8 05/06/2021 1408   ALBUMIN 4.3 05/06/2021 1408   AST 23 05/06/2021 1408   ALT 16 05/06/2021 1408   ALKPHOS 64 05/06/2021 1408   BILITOT 0.8 05/06/2021 1408   GFRNONAA >60 06/25/2020 2310   GFRAA >60 02/17/2020 0542     Diabetic Labs (most recent): Lab Results  Component Value Date   HGBA1C 5.0 01/28/2020   HGBA1C 4.9 01/30/2017     Lipid Panel ( most recent) Lipid Panel     Component Value Date/Time   CHOL 163 01/28/2020 0341   TRIG 33 01/28/2020 0341   HDL 75 01/28/2020 0341   CHOLHDL 2.2 01/28/2020 0341   VLDL 7 01/28/2020 0341   LDLCALC 81 01/28/2020 0341   LDLDIRECT 136.9 09/20/2007 1009      Lab Results  Component Value Date   TSH 1.08 09/25/2019   TSH 1.69 04/03/2019   TSH 0.790 08/31/2017   TSH 1.71 01/27/2017   TSH 1.10 08/08/2016   TSH 2.104 06/03/2015   TSH 1.307 11/21/2013   TSH 1.12 06/12/2013   TSH 1.42 12/12/2012   TSH 1.32 12/09/2011  FREET4 0.95 09/25/2019   FREET4 1.11 11/21/2013   FREET4 0.91 06/12/2013           Assessment & Plan:   1. Thyroid nodule  - Faustine Tates  is being seen at a kind request of McGowen, Adrian Blackwater, MD. - I have reviewed her available thyroid records and clinically evaluated the patient. - Based on these reviews, she has thyroid nodule described as moderately suspicious measuring 1.7 cm on the right lobe her thyroid  She is approached with options including fine-needle aspiration biopsy and she agrees. This procedure will be scheduled to be performed at [facility]ology.  She will return in 3 weeks with her biopsy results.  She is opting for surgery if she is found to have malignancy.   - I did not initiate any new prescriptions  today. - she is advised to maintain close follow up with McGowen, Adrian Blackwater, MD for primary care needs.   - Time spent with the patient: 50 minutes, of which >50% was spent in  counseling her about her thyroid nodule and the rest in obtaining information about her symptoms, reviewing her previous labs/studies ( including abstractions from other facilities),  evaluations, and treatments,  and developing a plan to confirm diagnosis and long term treatment based on the latest standards of care/guidelines; and documenting her care.  Alison Cuevas participated in the discussions, expressed understanding, and voiced agreement with the above plans.  All questions were answered to her satisfaction. she is encouraged to contact clinic should she have any questions or concerns prior to her return visit.  Follow up plan: Return in about 3 weeks (around 07/29/2021) for F/U with Biopsy Results.   Glade Lloyd, MD [facility] [address] [place], [place] [address] Phone: [phone]  Fax: [phone]     07/08/2021, 4:36 PM  This note was partially dictated with voice recognition software. Similar sounding words can be transcribed inadequately or may not  be corrected upon review.

## 2021-07-14 ENCOUNTER — Ambulatory Visit (INDEPENDENT_AMBULATORY_CARE_PROVIDER_SITE_OTHER): Payer: Medicare Other

## 2021-07-14 ENCOUNTER — Other Ambulatory Visit: Payer: Self-pay

## 2021-07-14 DIAGNOSIS — Z742 Need for assistance at home and no other household member able to render care: Secondary | ICD-10-CM | POA: Diagnosis not present

## 2021-07-14 DIAGNOSIS — Z0000 Encounter for general adult medical examination without abnormal findings: Secondary | ICD-10-CM

## 2021-07-14 NOTE — Progress Notes (Signed)
Virtual Visit via Telephone Note  I connected with  Alison Cuevas on 07/14/21 at 11:00 AM EST by telephone and verified that I am speaking with the correct person using two identifiers.  Medicare Annual Wellness visit completed telephonically due to Covid-19 pandemic.   Persons participating in this call: This Health Coach and this patient and Son  Location: Patient: Home Provider: Office   I discussed the limitations, risks, security and privacy concerns of performing an evaluation and management service by telephone and the availability of in person appointments. The patient expressed understanding and agreed to proceed.  Unable to perform video visit due to video visit attempted and failed and/or patient does not have video capability.   Some vital signs may be absent or patient reported.   Willette Brace, LPN   Subjective:   Alison Cuevas is a 76 y.o. female who presents for Medicare Annual (Subsequent) preventive examination.  Review of Systems     Cardiac Risk Factors include: advanced age (>9men, >33 women);hypertension     Objective:    Today's Vitals   07/14/21 1047  PainSc: 7    There is no height or weight on file to calculate BMI.  Advanced Directives 07/14/2021 05/16/2021 09/02/2020 09/02/2020 06/25/2020 01/29/2020 01/26/2020  Does Patient Have a Medical Advance Directive? Yes No Yes Yes No Yes Yes  Type of [employer];Living will [place];Living will - [place] -  Does patient want to make changes to medical advance directive? - - - No - Patient declined - No - Patient declined -  Copy of Standard in Chart? - - No - copy requested No - copy requested - - -  Would patient like information on creating a medical advance directive? - No - Patient declined - - No - Patient declined - -    Current Medications (verified) Outpatient Encounter  Medications as of 07/14/2021  Medication Sig   acetaminophen (TYLENOL) 500 MG tablet Take 1 tablet (500 mg total) by mouth 3 (three) times daily.   ALPRAZolam (XANAX) 0.5 MG tablet TAKE 1 TABLET BY MOUTH THREE TIMES A DAY AS NEEDED FOR ANXIETY   aspirin EC 81 MG tablet Take 1 tablet (81 mg total) by mouth daily. Swallow whole.   Biotin 5 MG TABS Take 5 mg by mouth daily.   fluticasone (FLONASE) 50 MCG/ACT nasal spray Place 2 sprays into both nostrils at bedtime.   HYDROcodone-acetaminophen (NORCO/VICODIN) 5-325 MG tablet 1/2-1 tab po q6h prn pain   KLOR-CON M20 20 MEQ tablet TAKE 1 TABLET BY MOUTH EVERY DAY   lisinopril (ZESTRIL) 5 MG tablet TAKE 1 TABLET BY MOUTH EVERY DAY   loratadine (CLARITIN) 10 MG tablet Take 10 mg by mouth every other day.    meclizine (ANTIVERT) 12.5 MG tablet Take 1 tablet (12.5 mg total) by mouth 3 (three) times daily. (Patient taking differently: Take 12.5 mg by mouth. Takes at night)   Multiple Minerals-Vitamins (CALCIUM-MAGNESIUM-ZINC-D3 PO) Take 1 tablet by mouth daily.    nystatin ointment (MYCOSTATIN) Apply 1 application topically 2 (two) times daily. Apply to affected area for up to 7 days.   pantoprazole (PROTONIX) 40 MG tablet TAKE 1 TABLET BY MOUTH EVERYDAY AT BEDTIME   polyethylene glycol (MIRALAX / GLYCOLAX) 17 g packet Take 17 g by mouth daily as needed.   Sodium Fluoride (CLINPRO 5000) 1.1 % PSTE Clinpro 5000 1.1 % dental paste   tiZANidine (  ZANAFLEX) 2 MG tablet TAKE 1 TABLET BY MOUTH 3 TIMES DAILY AS NEEDED FOR MUSCLE RELAXATION   triamcinolone ointment (KENALOG) 0.1 % triamcinolone acetonide 0.1 % topical ointment   verapamil (CALAN-SR) 180 MG CR tablet Take 1 tablet (180 mg total) by mouth daily.   ibuprofen (ADVIL) 200 MG tablet Take 200 mg by mouth every 6 (six) hours as needed. (Patient not taking: Reported on 07/14/2021)   No facility-administered encounter medications on file as of 07/14/2021.    Allergies (verified) Gabapentin and Prednisone    History: Past Medical History:  Diagnosis Date   Allergic rhinitis    maple pollen   Anxiety    Arthritis    Bowel obstruction (HCC)    Chronic low back pain    Dr. Trenton Gammon.  Has had back injection--bp elevated after.   Chronic pain syndrome    Chronic renal insufficiency, stage 2 (mild)    GFR 60-70   DDD (degenerative disc disease), cervical    Hx of ACDF (Dr. [name]).  Followed by Dr. [name].  Also, Dr. [name] do left C7-T1 intralaminal epidural injection.   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  Resid R arm and leg weakness + flexion contractures.   Hypercholesteremia    mild; pt declined statin trial 05/2014--needs recheck lipid panel at first f/u visit in 2017   Hypertension    Osteopenia 06/2017   T score -1.4 FRAX 15% / 2%   Ovarian cyst 2017   82019-robotic assisted bilatel SPO: all path benign.   Perianal dermatitis    prn cutivate   Phlebitis    Right hemiplegia ([place]) 01/2020   R arm and leg (hemorrhagic L cerebral hemisphere CVA)   Right knee injury 2018   Patellofemoral crush injury--Dr. [name].   Solitary thyroid nodule 05/2021   needs bx->plan endo ref   Stroke [facility])    Trochanteric bursitis of right hip    Recurrent (injection by Dr. 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 [name]). Recurrent fungal/bact OE.   Varicose vein    left leg    Past Surgical History:  Procedure Laterality Date   ABDOMINAL HYSTERECTOMY  03/2015   TAH.  Last pap 2016.  No hx of abnl paps.  Per GYN, no further pap smears indicated.   ANTERIOR AND POSTERIOR REPAIR N/A 03/18/2014   Procedure: Cystocele repair with graft, Vault suspension, Rectocele  repair;  Surgeon: [name], MD;  Location: [facility];  Service: Urology;  Laterality: N/A;   CHOLECYSTECTOMY     CHOLECYSTECTOMY, LAPAROSCOPIC     COLON SURGERY     COLONOSCOPY  04/2006; 05/26/16   2007 (Dr. [name]): Normal.  04/2016 (Dr. [name]) normal except diverticulosis and decreased anal sphincter tone.  No repeat colonoscopy is recommended due to age.   cspine surgery     Dr. Wiliam Ke level ant cerv discectomy [name] w/plating   CYSTO N/A 03/18/2014   Procedure: CYSTO;  Surgeon: [name], MD;  Location: [facility];  Service: Urology;  Laterality: N/A;   DEXA  07/30/2015; 06/2018   2017 and 2020 -->Osteopenia--repeat 2 yrs. T score -2.29 Mar 2021. Rpt 2 yrs.   LYSIS OF ADHESION N/A  01/31/2018   Procedure: POSSIBLE LYSIS OF ADHESION;  Surgeon: Everitt Amber, MD;  Location: [facility];  Service: Gynecology;  Laterality: N/A;   [place]     with prolasped bledder repair   RESECTION OF COLON     BENIGN TUMOR   RIGHT EAR SURGERY  08/23/2017   Dr. 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  Procedure: XI ROBOTIC ASSISTED BILATERAL SALPINGO OOPHORECTOMY;  Surgeon: Everitt Amber, MD;  Location: [facility];  Service: Gynecology;  Laterality: Bilateral;   TRANSTHORACIC ECHOCARDIOGRAM  01/27/2020   EF 65-70%, NORMAL.   TUBAL LIGATION     Family History  Problem Relation Age of Onset   Thyroid disease Mother    Hypertension Mother    Diverticulitis Mother    Stomach cancer Father    Heart  attack Father    Heart failure Father    Breast cancer Sister 4   Lupus Sister    Melanoma Brother    Prostate cancer Brother    Leukemia Brother    Lung disease Brother    Other Other        Family member with MGUS   Social History   Socioeconomic History   Marital status: Widowed    Spouse name: [name]   Number of children: 3   Years of education: Not on file   Highest education level: 9th grade  Occupational History   Occupation: retired  Tobacco Use   Smoking status: Never   Smokeless tobacco: Never  Vaping Use   Vaping Use: Never used  Substance and Sexual Activity   Alcohol use: No    Alcohol/week: 0.0 standard drinks   Drug use: No   Sexual activity: Not Currently    Birth control/protection: Surgical    Comment: hysterectomy  Other Topics Concern   Not on file  Social History Narrative   Widow (2022), has 3 children (one lives near her).  Has 8 grandchildren, 2 greatgrandchildren.  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  No alcohol.  No drugs.Exercise: clean, work in yard.  10 siblings in all--5 have passed away--1 child age 45 with whooping cough , 1 lupus, 1 melanoma,1 lung cancer, 1 pulm fibrosis   Social Determinants of Health   Financial Resource Strain: Low Risk    Difficulty of Paying Living Expenses: Not hard at all  Food Insecurity: No Food Insecurity   Worried About Charity fundraiser in the Last Year: Never true   Ran Out of Food in the Last Year: Never true  Transportation Needs: No Transportation Needs   Lack of Transportation (Medical): No   Lack of Transportation (Non-Medical): No  Physical Activity: Insufficiently Active   Days of Exercise per Week: 2 days   Minutes of Exercise per Session: 30 min  Stress: No Stress Concern Present   Feeling of Stress : Not at all  Social Connections: Moderately Isolated   Frequency of Communication with Friends and Family: More  than three times a week   Frequency of  Social Gatherings with Friends and Family: Once a week   Attends Religious Services: 1 to 4 times per year   Active Member of [employer] or Organizations: No   Attends Archivist Meetings: Never   Marital Status: Widowed    Tobacco Counseling Counseling given: Not Answered   Clinical Intake:  Pre-visit preparation completed: Yes  Pain : 0-10 Pain Score: 7  Pain Type: Chronic pain Pain Location: Leg (and ankle) Pain Orientation: Right Pain Descriptors / Indicators: Aching, Throbbing, Sore Pain Onset: More than a month ago Pain Frequency: Intermittent     BMI - recorded: 22.07 Nutritional Status: BMI of 19-24  Normal Nutritional Risks: None Diabetes: No  How often do you need to have someone help you when you read instructions, pamphlets, or other written materials from your doctor or pharmacy?: 1 - Never  Diabetic?No  Interpreter Needed?: No  Information entered by :: Charlott Rakes, LPN   Activities of Daily Living In your present state of health, do you have any difficulty performing the following activities: 07/14/2021  Hearing? Y  Comment HOH  Vision? N  Difficulty concentrating or making decisions? N  Walking or climbing stairs? Y  Dressing or bathing? Y  Comment needs assistance  Doing errands, shopping? Y  Preparing Food and eating ? Y  Comment assistance family assist as needed  Using the Toilet? N  In the past six months, have you accidently leaked urine? N  Do you have problems with loss of bowel control? Y  Comment wears a brief  Managing your Medications? N  Managing your Finances? N  Housekeeping or managing your Housekeeping? Y  Some recent data might be hidden    Patient Care Team: [name], MD as PCP - General (Family Medicine) Bjorn Loser, MD as Consulting Physician (Urology) Fontaine, Belinda Block, MD (Inactive) as Consulting Physician (Gynecology) Everitt Amber, MD as Consulting  Physician (Obstetrics and Gynecology) Gatha Mayer, MD as Consulting Physician (Gastroenterology) Laroy Apple, MD as Referring Physician (Physical Medicine and Rehabilitation) Clista Bernhardt, MD as Referring Physician (Dermatology) Leta Baptist, MD as Consulting Physician (Otolaryngology) Levy Sjogren, MD as Referring Physician (Dermatology) Joseph Pierini, MD (Inactive) as Consulting Physician (Obstetrics and Gynecology) Garvin Fila, MD as Consulting Physician (Neurology) Pedro Earls, MD as Consulting Physician (Sports Medicine) Meredith Staggers, MD as Consulting Physician (Physical Medicine and Rehabilitation)  Indicate any recent Medical Services you may have received from other than [facility] providers in the past year (date may be approximate).     Assessment:   This is a routine wellness examination for Alison Cuevas.  Hearing/Vision screen Hearing Screening - Comments:: Pt stated HOH Vision Screening - Comments:: Pt follows up with  [facility] Dr For annual eye exams   Dietary issues and exercise activities discussed: Current Exercise Habits: Structured exercise class, Type of exercise: Other - see comments, Time (Minutes): 30, Frequency (Times/Week): 2, Weekly Exercise (Minutes/Week): 60   Goals Addressed             This Visit's Progress    Patient Stated       Doing alI know to do to get well        Depression Screen PHQ 2/9 Scores 07/14/2021 05/05/2021 10/14/2020 06/10/2020 03/04/2020 11/02/2018 01/26/2018  PHQ - 2 Score 0 0 2 4 4  0 0  PHQ- 9 Score - - - - - 0 -    Fall Risk Fall Risk  07/14/2021 05/06/2021 05/05/2021 10/14/2020 08/12/2020  Falls in  the past year? 0 0 0 0 0  Number falls in past yr: 0 - 0 0 -  Injury with Fall? 0 - - 0 -  Risk for fall due to : Impaired mobility;Impaired balance/gait;Impaired vision - - - -  Follow up Falls prevention discussed - - - -    FALL RISK PREVENTION PERTAINING TO THE HOME:  Any stairs in or around the home?  Yes  If so, are there any without handrails? No  Home free of loose throw rugs in walkways, pet beds, electrical cords, etc? Yes  Adequate lighting in your home to reduce risk of falls? Yes   ASSISTIVE DEVICES UTILIZED TO PREVENT FALLS:  Life alert? Yes  Use of a cane, walker or w/c? Yes  Grab bars in the bathroom? Yes  Shower chair or bench in shower? Yes  Elevated toilet seat or a handicapped toilet? Yes   TIMED UP AND GO:  Was the test performed? No .   Cognitive Function: MMSE - Mini Mental State Exam 01/26/2018  Orientation to time 4  Orientation to Place 5  Registration 3  Attention/ Calculation 5  Recall 2  Language- name 2 objects 2  Language- repeat 1  Language- follow 3 step command 3  Language- read & follow direction 1  Write a sentence 1  Copy design 0  Total score 27     6CIT Screen 07/14/2021  What Year? 0 points  What month? 0 points  What time? 0 points  Count back from 20 0 points  Months in reverse 4 points  Repeat phrase 6 points  Total Score 10    Immunizations Immunization History  Administered Date(s) Administered   Fluad Quad(high Dose 65+) 04/03/2019, 05/06/2021   Influenza Split 04/27/2013   Influenza, High Dose Seasonal PF 04/27/2020   Influenza-Unspecified 03/30/2015, 03/30/2018   Moderna Sars-Covid-2 Vaccination 08/01/2019, 08/30/2019   Pneumococcal Conjugate-13 12/23/2014   Pneumococcal Polysaccharide-23 05/24/2010, 11/16/2010   Tdap 09/29/2014, 12/17/2016   Zoster, Live 11/18/2014    TDAP status: Up to date  Flu Vaccine status: Up to date  Pneumococcal vaccine status: Up to date  Covid-19 vaccine status: Completed vaccines  Qualifies for Shingles Vaccine? Yes   Zostavax completed Yes   Shingrix Completed?: No.    Education has been provided regarding the importance of this vaccine. Patient has been advised to call insurance company to determine out of pocket expense if they have not yet received this vaccine. Advised may  also receive vaccine at local pharmacy or Health Dept. Verbalized acceptance and understanding.  Screening Tests Health Maintenance  Topic Date Due   Zoster Vaccines- Shingrix (1 of 2) Never done   COVID-19 Vaccine (3 - Moderna risk series) 09/27/2019   TETANUS/TDAP  12/18/2026   Pneumonia Vaccine 77+ Years old  Completed   INFLUENZA VACCINE  Completed   DEXA SCAN  Completed   Hepatitis C Screening  Completed   HPV VACCINES  Aged Out   COLONOSCOPY (Pts 45-109yrs Insurance coverage will need to be confirmed)  Discontinued    Health Maintenance  Health Maintenance Due  Topic Date Due   Zoster Vaccines- Shingrix (1 of 2) Never done   COVID-19 Vaccine (3 - Moderna risk series) 09/27/2019    Colorectal cancer screening: No longer required.   Mammogram status: Completed 12/15/20. Repeat every year  Bone Density status: Completed 04/13/21. Results reflect: Bone density results: OSTEOPENIA. Repeat every 2 years.   Additional Screening:  Hepatitis C Screening: Completed 01/27/17  Vision  Screening: Recommended annual ophthalmology exams for early detection of glaucoma and other disorders of the eye. Is the patient up to date with their annual eye exam?  Yes  Who is the provider or what is the name of the office in which the patient attends annual eye exams? [facility] If pt is not established with a provider, would they like to be referred to a provider to establish care? No .   Dental Screening: Recommended annual dental exams for proper oral hygiene  Community Resource Referral / Chronic Care Management: CRR required this visit?  No   CCM required this visit?  No      Plan:     I have personally reviewed and noted the following in the patients chart:   Medical and social history Use of alcohol, tobacco or illicit drugs  Current medications and supplements including opioid prescriptions.  Functional ability and status Nutritional status Physical activity Advanced  directives List of other physicians Hospitalizations, surgeries, and ER visits in previous 12 months Vitals Screenings to include cognitive, depression, and falls Referrals and appointments  In addition, I have reviewed and discussed with patient certain preventive protocols, quality metrics, and best practice recommendations. A written personalized care plan for preventive services as well as general preventive health recommendations were provided to patient.     Willette Brace, LPN   [id number]/40/[id number]   Nurse Notes: none

## 2021-07-14 NOTE — Patient Instructions (Signed)
Alison Cuevas , Thank you for taking time to come for your Medicare Wellness Visit. I appreciate your ongoing commitment to your health goals. Please review the following plan we discussed and let me know if I can assist you in the future.   Screening recommendations/referrals: Colonoscopy: no longer required  Mammogram: Done 12/15/20 repeat every year  Bone Density: Done 04/13/21 repeat every 2 years  Recommended yearly ophthalmology/optometry visit for glaucoma screening and checkup Recommended yearly dental visit for hygiene and checkup  Vaccinations: Influenza vaccine: Done 05/06/21 repeat every year  Pneumococcal vaccine: Up to date Tdap vaccine: Done 12/17/16 repeat every 10 years  Shingles vaccine: Shingrix discussed. Please contact your pharmacy for coverage information.    Covid-19:Completed 2/4, 08/30/19   Advanced directives: Please bring a copy of your health care power of attorney and living will to the office at your convenience.  Conditions/risks identified: Doing all I can to get well  Next appointment: Follow up in one year for your annual wellness visit    Preventive Care 65 Years and Older, Female Preventive care refers to lifestyle choices and visits with your health care provider that can promote health and wellness. What does preventive care include? A yearly physical exam. This is also called an annual well check. Dental exams once or twice a year. Routine eye exams. Ask your health care provider how often you should have your eyes checked. Personal lifestyle choices, including: Daily care of your teeth and gums. Regular physical activity. Eating a healthy diet. Avoiding tobacco and drug use. Limiting alcohol use. Practicing safe sex. Taking low-dose aspirin every day. Taking vitamin and mineral supplements as recommended by your health care provider. What happens during an annual well check? The services and screenings done by your health care provider  during your annual well check will depend on your age, overall health, lifestyle risk factors, and family history of disease. Counseling  Your health care provider may ask you questions about your: Alcohol use. Tobacco use. Drug use. Emotional well-being. Home and relationship well-being. Sexual activity. Eating habits. History of falls. Memory and ability to understand (cognition). Work and work Statistician. Reproductive health. Screening  You may have the following tests or measurements: Height, weight, and BMI. Blood pressure. Lipid and cholesterol levels. These may be checked every 5 years, or more frequently if you are over 12 years old. Skin check. Lung cancer screening. You may have this screening every year starting at age 20 if you have a 30-pack-year history of smoking and currently smoke or have quit within the past 15 years. Fecal occult blood test (FOBT) of the stool. You may have this test every year starting at age 59. Flexible sigmoidoscopy or colonoscopy. You may have a sigmoidoscopy every 5 years or a colonoscopy every 10 years starting at age 21. Hepatitis C blood test. Hepatitis B blood test. Sexually transmitted disease (STD) testing. Diabetes screening. This is done by checking your blood sugar (glucose) after you have not eaten for a while (fasting). You may have this done every 1-3 years. Bone density scan. This is done to screen for osteoporosis. You may have this done starting at age 53. Mammogram. This may be done every 1-2 years. Talk to your health care provider about how often you should have regular mammograms. Talk with your health care provider about your test results, treatment options, and if necessary, the need for more tests. Vaccines  Your health care provider may recommend certain vaccines, such as: Influenza vaccine. This is  recommended every year. Tetanus, diphtheria, and acellular pertussis (Tdap, Td) vaccine. You may need a Td booster every  10 years. Zoster vaccine. You may need this after age 19. Pneumococcal 13-valent conjugate (PCV13) vaccine. One dose is recommended after age 51. Pneumococcal polysaccharide (PPSV23) vaccine. One dose is recommended after age 5. Talk to your health care provider about which screenings and vaccines you need and how often you need them. This information is not intended to replace advice given to you by your health care provider. Make sure you discuss any questions you have with your health care provider. Document Released: 07/10/2015 Document Revised: 03/02/2016 Document Reviewed: 04/14/2015 Elsevier Interactive Patient Education  2017 [place] Prevention in the Home Falls can cause injuries. They can happen to people of all ages. There are many things you can do to make your home safe and to help prevent falls. What can I do on the outside of my home? Regularly fix the edges of walkways and driveways and fix any cracks. Remove anything that might make you trip as you walk through a door, such as a raised step or threshold. Trim any bushes or trees on the path to your home. Use bright outdoor lighting. Clear any walking paths of anything that might make someone trip, such as rocks or tools. Regularly check to see if handrails are loose or broken. Make sure that both sides of any steps have handrails. Any raised decks and porches should have guardrails on the edges. Have any leaves, snow, or ice cleared regularly. Use sand or salt on walking paths during winter. Clean up any spills in your garage right away. This includes oil or grease spills. What can I do in the bathroom? Use night lights. Install grab bars by the toilet and in the tub and shower. Do not use towel bars as grab bars. Use non-skid mats or decals in the tub or shower. If you need to sit down in the shower, use a plastic, non-slip stool. Keep the floor dry. Clean up any water that spills on the floor as soon as it  happens. Remove soap buildup in the tub or shower regularly. Attach bath mats securely with double-sided non-slip rug tape. Do not have throw rugs and other things on the floor that can make you trip. What can I do in the bedroom? Use night lights. Make sure that you have a light by your bed that is easy to reach. Do not use any sheets or blankets that are too big for your bed. They should not hang down onto the floor. Have a firm chair that has side arms. You can use this for support while you get dressed. Do not have throw rugs and other things on the floor that can make you trip. What can I do in the kitchen? Clean up any spills right away. Avoid walking on wet floors. Keep items that you use a lot in easy-to-reach places. If you need to reach something above you, use a strong step stool that has a grab bar. Keep electrical cords out of the way. Do not use floor polish or wax that makes floors slippery. If you must use wax, use non-skid floor wax. Do not have throw rugs and other things on the floor that can make you trip. What can I do with my stairs? Do not leave any items on the stairs. Make sure that there are handrails on both sides of the stairs and use them. Fix handrails that are  broken or loose. Make sure that handrails are as long as the stairways. Check any carpeting to make sure that it is firmly attached to the stairs. Fix any carpet that is loose or worn. Avoid having throw rugs at the top or bottom of the stairs. If you do have throw rugs, attach them to the floor with carpet tape. Make sure that you have a light switch at the top of the stairs and the bottom of the stairs. If you do not have them, ask someone to add them for you. What else can I do to help prevent falls? Wear shoes that: Do not have high heels. Have rubber bottoms. Are comfortable and fit you well. Are closed at the toe. Do not wear sandals. If you use a stepladder: Make sure that it is fully opened.  Do not climb a closed stepladder. Make sure that both sides of the stepladder are locked into place. Ask someone to hold it for you, if possible. Clearly mark and make sure that you can see: Any grab bars or handrails. First and last steps. Where the edge of each step is. Use tools that help you move around (mobility aids) if they are needed. These include: Canes. Walkers. Scooters. Crutches. Turn on the lights when you go into a dark area. Replace any light bulbs as soon as they burn out. Set up your furniture so you have a clear path. Avoid moving your furniture around. If any of your floors are uneven, fix them. If there are any pets around you, be aware of where they are. Review your medicines with your doctor. Some medicines can make you feel dizzy. This can increase your chance of falling. Ask your doctor what other things that you can do to help prevent falls. This information is not intended to replace advice given to you by your health care provider. Make sure you discuss any questions you have with your health care provider. Document Released: 04/09/2009 Document Revised: 11/19/2015 Document Reviewed: 07/18/2014 Elsevier Interactive Patient Education  2017 [employer].

## 2021-07-20 ENCOUNTER — Encounter: Payer: Self-pay | Admitting: "Endocrinology

## 2021-07-21 ENCOUNTER — Other Ambulatory Visit: Payer: Self-pay

## 2021-07-21 ENCOUNTER — Ambulatory Visit (HOSPITAL_COMMUNITY)
Admission: RE | Admit: 2021-07-21 | Discharge: 2021-07-21 | Disposition: A | Discharge status: Home / Self Care | Payer: Medicare Other | Source: Ambulatory Visit | Attending: "Endocrinology | Admitting: "Endocrinology

## 2021-07-21 ENCOUNTER — Encounter (HOSPITAL_COMMUNITY): Payer: Self-pay

## 2021-07-21 ENCOUNTER — Telehealth: Payer: Self-pay | Admitting: Family Medicine

## 2021-07-21 DIAGNOSIS — E041 Nontoxic single thyroid nodule: Secondary | ICD-10-CM

## 2021-07-21 MED ORDER — LIDOCAINE HCL (PF) 2 % IJ SOLN
INTRAMUSCULAR | Status: AC
  Administered 2021-07-21: 11:00:00 10 mL
  Filled 2021-07-21: qty 10

## 2021-07-21 MED ORDER — LIDOCAINE HCL (PF) 2 % IJ SOLN: 10.0000 mL | Freq: Once | INTRAMUSCULAR | Status: AC

## 2021-07-21 NOTE — Progress Notes (Signed)
PT tolerated thyroid biopsy procedure well today. Labs and afirma obtained and sent for pathology at 1135. PT pushed via wheelchair at discharge with no acute distress noted and verbalized understanding of discharge instructions.

## 2021-07-21 NOTE — Telephone Encounter (Signed)
Pt called in, went for biospy, told her to go off medicine 3 days before biopsy. When shoiuld she start taking her aspirin.  Should it be today or tomorrow?  Phone:  [phone]

## 2021-07-22 ENCOUNTER — Telehealth: Payer: Self-pay | Admitting: Podiatry

## 2021-07-22 LAB — CYTOLOGY - NON PAP

## 2021-07-22 NOTE — Telephone Encounter (Signed)
Pt called and left message stating she got the brace a while back and it did not work for her it was causing pain.She asked about the warranty. She has been so busy with other appts that she forgot about the brace because she was not wearing it..  I returned call and told pt that we needed to get her scheduled for sooner rather than later because it has been so long. She was hesitant but we have scheduled her and she will call to r/s if needed.

## 2021-07-23 NOTE — Telephone Encounter (Signed)
Pt already started taking Asprin again

## 2021-07-25 ENCOUNTER — Other Ambulatory Visit: Payer: Self-pay | Admitting: Family Medicine

## 2021-07-28 ENCOUNTER — Other Ambulatory Visit: Payer: Self-pay

## 2021-07-28 ENCOUNTER — Encounter: Payer: Self-pay | Admitting: "Endocrinology

## 2021-07-28 ENCOUNTER — Ambulatory Visit (INDEPENDENT_AMBULATORY_CARE_PROVIDER_SITE_OTHER): Payer: Medicare Other | Admitting: "Endocrinology

## 2021-07-28 VITALS — BP 134/72 | HR 72 | Ht 60.0 in | Wt 116.0 lb

## 2021-07-28 DIAGNOSIS — E041 Nontoxic single thyroid nodule: Secondary | ICD-10-CM

## 2021-07-28 NOTE — Progress Notes (Signed)
Endocrinology follow-up note                                             07/28/2021, 1:21 PM   Subjective:    Patient ID: Alison Cuevas, female    DOB: 22-Jun-1946, PCP [name], MD   Past Medical History:  Diagnosis Date   Allergic rhinitis    maple pollen   Anxiety    Arthritis    Bowel obstruction (HCC)    Chronic low back pain    Dr. Trenton Gammon.  Has had back injection--bp elevated after.   Chronic pain syndrome    Chronic renal insufficiency, stage 2 (mild)    GFR 60-70   DDD (degenerative disc disease), cervical    Hx of ACDF (Dr. [name]).  Followed by Dr. [name].  Also, Dr. [name] do left C7-T1 intralaminal epidural injection.   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  Resid R arm and leg weakness + flexion contractures.   Hypercholesteremia    mild; pt declined statin trial 05/2014--needs recheck lipid panel at first f/u visit in 2017   Hypertension    Osteopenia 06/2017   T score -1.4 FRAX 15% / 2%   Ovarian cyst 2017   82019-robotic assisted bilatel SPO: all path benign.   Perianal dermatitis    prn cutivate   Phlebitis    Right hemiplegia ([place]) 01/2020   R arm and leg (hemorrhagic L cerebral hemisphere CVA)   Right knee injury 2018   Patellofemoral crush injury--Dr. [name].   Solitary thyroid nodule 05/2021   needs bx->plan endo ref   Stroke [facility])    Trochanteric bursitis of right hip    Recurrent (injection by Dr. 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 [name]). Recurrent fungal/bact OE.   Varicose vein    left leg    Past Surgical History:  Procedure Laterality Date   ABDOMINAL HYSTERECTOMY  03/2015   TAH.   Last pap 2016.  No hx of abnl paps.  Per GYN, no further pap smears indicated.   ANTERIOR AND POSTERIOR REPAIR N/A 03/18/2014   Procedure: Cystocele repair with graft, Vault suspension, Rectocele repair;  Surgeon: [name], MD;  Location: [facility];  Service: Urology;  Laterality: N/A;   CHOLECYSTECTOMY     CHOLECYSTECTOMY, LAPAROSCOPIC     COLON SURGERY     COLONOSCOPY  04/2006; 05/26/16   2007 (Dr. [name]): Normal.  04/2016 (Dr. [name]) normal except diverticulosis and decreased anal sphincter tone.  No repeat colonoscopy is recommended due to age.   cspine surgery     Dr. Wiliam Ke level ant cerv discectomy [name] w/plating   CYSTO N/A 03/18/2014   Procedure: CYSTO;  Surgeon: [name], MD;  Location: [facility];  Service: Urology;  Laterality: N/A;   DEXA  07/30/2015; 06/2018   2017 and 2020 -->Osteopenia--repeat 2 yrs. T score -2.29 Mar 2021. Rpt 2 yrs.   LYSIS OF ADHESION N/A 01/31/2018   Procedure: POSSIBLE LYSIS OF ADHESION;  Surgeon: Everitt Amber, MD;  Location:  [facility];  Service: Gynecology;  Laterality: N/A;   [place]     with prolasped bledder repair   RESECTION OF COLON     BENIGN TUMOR   RIGHT EAR SURGERY  08/23/2017   Dr. 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  Procedure: XI ROBOTIC ASSISTED BILATERAL SALPINGO OOPHORECTOMY;  Surgeon: Everitt Amber, MD;  Location: [facility];  Service: Gynecology;  Laterality: Bilateral;   TRANSTHORACIC ECHOCARDIOGRAM  01/27/2020   EF  65-70%, NORMAL.   TUBAL LIGATION     Social History   Socioeconomic History   Marital status: Widowed    Spouse name: [name]   Number of children: 3   Years of education: Not on file   Highest education level: 9th grade  Occupational History   Occupation: retired  Tobacco Use   Smoking status: Never   Smokeless tobacco: Never  Vaping Use   Vaping Use: Never used  Substance and Sexual Activity   Alcohol use: No    Alcohol/week: 0.0 standard drinks   Drug use: No   Sexual activity: Not Currently    Birth control/protection: Surgical    Comment: hysterectomy  Other Topics Concern   Not on file  Social History Narrative   Widow (2022), has 3 children (one lives near her).  Has 8 grandchildren, 2 greatgrandchildren.  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  No alcohol.  No drugs.Exercise: clean, work in yard.  10 siblings in all--5 have passed away--1 child age 84 with whooping cough , 1 lupus, 1 melanoma,1 lung cancer, 1 pulm fibrosis   Social Determinants of Health   Financial Resource Strain: Low Risk    Difficulty of Paying Living Expenses: Not hard at all  Food Insecurity: No Food Insecurity   Worried About Charity fundraiser in the Last Year: Never true   Ran Out of Food in the Last Year: Never true  Transportation Needs: No Transportation Needs   Lack of Transportation (Medical): No   Lack of Transportation (Non-Medical): No  Physical Activity: Insufficiently Active   Days of Exercise per Week: 2 days   Minutes of Exercise per Session: 30 min  Stress: No Stress Concern Present   Feeling of Stress : Not at all  Social Connections: Moderately Isolated   Frequency of Communication with Friends and Family: More than three times a week   Frequency of Social Gatherings with Friends and Family: Once a week   Attends Religious Services: 1 to 4 times per year   Active Member of [employer] or Organizations:  No   Attends Archivist Meetings: Never   Marital Status: Widowed   Family History  Problem Relation Age of Onset   Thyroid disease Mother    Hypertension Mother    Diverticulitis Mother    Stomach cancer Father    Heart attack Father    Heart failure Father    Breast cancer Sister 18   Lupus Sister    Melanoma Brother    Prostate cancer Brother  Leukemia Brother    Lung disease Brother    Other Other        Family member with MGUS   Outpatient Encounter Medications as of 07/28/2021  Medication Sig   acetaminophen (TYLENOL) 500 MG tablet Take 1 tablet (500 mg total) by mouth 3 (three) times daily.   ALPRAZolam (XANAX) 0.5 MG tablet TAKE 1 TABLET BY MOUTH THREE TIMES A DAY AS NEEDED FOR ANXIETY   aspirin EC 81 MG tablet Take 1 tablet (81 mg total) by mouth daily. Swallow whole.   Biotin 5 MG TABS Take 5 mg by mouth daily.   fluticasone (FLONASE) 50 MCG/ACT nasal spray Place 2 sprays into both nostrils at bedtime.   HYDROcodone-acetaminophen (NORCO/VICODIN) 5-325 MG tablet 1/2-1 tab po q6h prn pain   ibuprofen (ADVIL) 200 MG tablet Take 200 mg by mouth every 6 (six) hours as needed. (Patient not taking: Reported on 07/14/2021)   KLOR-CON M20 20 MEQ tablet TAKE 1 TABLET BY MOUTH EVERY DAY   lisinopril (ZESTRIL) 5 MG tablet TAKE 1 TABLET BY MOUTH EVERY DAY   loratadine (CLARITIN) 10 MG tablet Take 10 mg by mouth every other day.    meclizine (ANTIVERT) 12.5 MG tablet Take 1 tablet (12.5 mg total) by mouth 3 (three) times daily. (Patient taking differently: Take 12.5 mg by mouth. Takes at night)   Multiple Minerals-Vitamins (CALCIUM-MAGNESIUM-ZINC-D3 PO) Take 1 tablet by mouth daily.    nystatin ointment (MYCOSTATIN) Apply 1 application topically 2 (two) times daily. Apply to affected area for up to 7 days.   pantoprazole (PROTONIX) 40 MG tablet TAKE 1 TABLET BY MOUTH EVERYDAY AT BEDTIME   polyethylene glycol (MIRALAX / GLYCOLAX) 17 g packet Take 17 g by mouth daily as  needed.   Sodium Fluoride (CLINPRO 5000) 1.1 % PSTE Clinpro 5000 1.1 % dental paste   tiZANidine (ZANAFLEX) 2 MG tablet TAKE 1 TABLET BY MOUTH 3 TIMES DAILY AS NEEDED FOR MUSCLE RELAXATION   triamcinolone ointment (KENALOG) 0.1 % triamcinolone acetonide 0.1 % topical ointment   verapamil (CALAN-SR) 180 MG CR tablet TAKE 1 TABLET BY MOUTH EVERY DAY   No facility-administered encounter medications on file as of 07/28/2021.   ALLERGIES: Allergies  Allergen Reactions   Gabapentin Anxiety    Elevated heart rate and crazy dreams   Prednisone Anxiety and Other (See Comments)    REACTION: funny feeling    VACCINATION STATUS: Immunization History  Administered Date(s) Administered   Fluad Quad(high Dose 65+) 04/03/2019, 05/06/2021   Influenza Split 04/27/2013   Influenza, High Dose Seasonal PF 04/27/2020   Influenza-Unspecified 03/30/2015, 03/30/2018   Moderna Sars-Covid-2 Vaccination 08/01/2019, 08/30/2019   Pneumococcal Conjugate-13 12/23/2014   Pneumococcal Polysaccharide-23 05/24/2010, 11/16/2010   Tdap 09/29/2014, 12/17/2016   Zoster, Live 11/18/2014    HPI Johnnae Impastato is 76 y.o. female who presents today with a medical history as above. she is being seen in follow-up after she was seen in consultation for thyroid nodule requested by [name], MD.  She is accompanied by her son Budd to clinic today.     See notes from previous visit.  While undergoing work-up for stroke, she was found to have 1.7 cm nodule on the right lobe of her thyroid on June 01, 2021.  She denies any prior knowledge of thyroid dysfunction.  She is not on any antithyroid medications nor  thyroid hormone supplements. She was sent for fine-needle aspiration biopsy during her last visit.  The biopsy results are benign. Patient denies dysphagia,  shortness of breath, nor voice change.  She denies any family history of thyroid malignancy although she has various thyroid dysfunction in her family.  Her most  recent thyroid function tests were within normal limits. She is bound to a walker at baseline due to right-sided hemiparesis.    Review of Systems  Constitutional: + Minimally fluctuating body weight, no fatigue, no subjective hyperthermia, no subjective hypothermia  Objective:    Vitals with BMI 07/28/2021 07/21/2021 07/21/2021  Height 5\' 0"  - -  Weight 116 lbs - -  BMI 28.41 - -  Systolic 324 401 027  Diastolic 72 63 65  Pulse 72 76 84    BP 134/72    Pulse 72    Ht 5' (1.524 m)    Wt 116 lb (52.6 kg)    LMP 06/27/1972    BMI 22.65 kg/m   Wt Readings from Last 3 Encounters:  07/28/21 116 lb (52.6 kg)  05/16/21 113 lb (51.3 kg)  05/06/21 114 lb 3.2 oz (51.8 kg)    Physical Exam  Constitutional:  Body mass index is 22.65 kg/m.,  not in acute distress, normal state of mind Eyes: PERRLA, EOMI, no exophthalmos ENT: moist mucous membranes, no gross thyromegaly, no gross cervical lymphadenopathy  Musculoskeletal: + bound to a walker, no gross deformities, strength intact in all four extremities   CMP ( most recent) CMP     Component Value Date/Time   NA 133 (L) 05/06/2021 1408   K 4.0 05/06/2021 1408   CL 98 05/06/2021 1408   CO2 27 05/06/2021 1408   GLUCOSE 83 05/06/2021 1408   BUN 12 05/06/2021 1408   CREATININE 0.67 05/06/2021 1408   CREATININE 0.67 11/06/2020 1640   CALCIUM 9.5 05/06/2021 1408   PROT 6.8 05/06/2021 1408   ALBUMIN 4.3 05/06/2021 1408   AST 23 05/06/2021 1408   ALT 16 05/06/2021 1408   ALKPHOS 64 05/06/2021 1408   BILITOT 0.8 05/06/2021 1408   GFRNONAA >60 06/25/2020 2310   GFRAA >60 02/17/2020 0542     Diabetic Labs (most recent): Lab Results  Component Value Date   HGBA1C 5.0 01/28/2020   HGBA1C 4.9 01/30/2017     Lipid Panel ( most recent) Lipid Panel     Component Value Date/Time   CHOL 163 01/28/2020 0341   TRIG 33 01/28/2020 0341   HDL 75 01/28/2020 0341   CHOLHDL 2.2 01/28/2020 0341   VLDL 7 01/28/2020 0341   LDLCALC 81  01/28/2020 0341   LDLDIRECT 136.9 09/20/2007 1009      Lab Results  Component Value Date   TSH 1.08 09/25/2019   TSH 1.69 04/03/2019   TSH 0.790 08/31/2017   TSH 1.71 01/27/2017   TSH 1.10 08/08/2016   TSH 2.104 06/03/2015   TSH 1.307 11/21/2013   TSH 1.12 06/12/2013   TSH 1.42 12/12/2012   TSH 1.32 12/09/2011   FREET4 0.95 09/25/2019   FREET4 1.11 11/21/2013   FREET4 0.91 06/12/2013          FINAL MICROSCOPIC DIAGNOSIS:  - Consistent with benign follicular nodule (Bethesda category II)   SPECIMEN ADEQUACY:  Satisfactory but limited for evaluation, scant cellularity   Assessment & Plan:   1. Thyroid nodule -I have reviewed her biopsy results and imaging studies with her.    -Her fine-needle aspiration biopsy findings are benign.  She will not need surgical intervention at this time. she has thyroid nodule described as moderately suspicious measuring 1.7 cm on the right lobe her  thyroid. She will need repeat thyroid function test in a year.  This can be conducted by her PMD and patient will be seen as needed. - I did not initiate any new prescriptions today. - she is advised to maintain close follow up with McGowen, Adrian Blackwater, MD for primary care needs.   I spent 21 minutes in the care of the patient today including review of labs from Thyroid Function, CMP, and other relevant labs ; imaging/biopsy records (current and previous including abstractions from other facilities); face-to-face time discussing  her lab results and symptoms, medications doses, her options of short and long term treatment based on the latest standards of care / guidelines;   and documenting the encounter.  Alison Cuevas  participated in the discussions, expressed understanding, and voiced agreement with the above plans.  All questions were answered to her satisfaction. she is encouraged to contact clinic should she have any questions or concerns prior to her return visit.   Follow up plan: Return  if symptoms worsen or fail to improve.   Glade Lloyd, MD [facility] [address] [place], [place] [address] Phone: [phone]  Fax: [phone]     07/28/2021, 1:21 PM  This note was partially dictated with voice recognition software. Similar sounding words can be transcribed inadequately or may not  be corrected upon review.

## 2021-07-31 ENCOUNTER — Other Ambulatory Visit: Payer: Self-pay | Admitting: Family Medicine

## 2021-08-03 DIAGNOSIS — H90A31 Mixed conductive and sensorineural hearing loss, unilateral, right ear with restricted hearing on the contralateral side: Secondary | ICD-10-CM | POA: Diagnosis not present

## 2021-08-03 DIAGNOSIS — Z9889 Other specified postprocedural states: Secondary | ICD-10-CM | POA: Diagnosis not present

## 2021-08-03 DIAGNOSIS — H7291 Unspecified perforation of tympanic membrane, right ear: Secondary | ICD-10-CM | POA: Diagnosis not present

## 2021-08-03 DIAGNOSIS — H90A22 Sensorineural hearing loss, unilateral, left ear, with restricted hearing on the contralateral side: Secondary | ICD-10-CM | POA: Diagnosis not present

## 2021-08-03 DIAGNOSIS — Z9629 Presence of other otological and audiological implants: Secondary | ICD-10-CM | POA: Diagnosis not present

## 2021-08-04 ENCOUNTER — Encounter: Payer: Medicare Other | Attending: Physical Medicine & Rehabilitation | Admitting: Physical Medicine & Rehabilitation

## 2021-08-04 ENCOUNTER — Other Ambulatory Visit: Payer: Self-pay

## 2021-08-04 ENCOUNTER — Encounter: Payer: Self-pay | Admitting: Physical Medicine & Rehabilitation

## 2021-08-04 VITALS — BP 150/72 | HR 86

## 2021-08-04 DIAGNOSIS — G8111 Spastic hemiplegia affecting right dominant side: Secondary | ICD-10-CM

## 2021-08-04 DIAGNOSIS — I619 Nontraumatic intracerebral hemorrhage, unspecified: Secondary | ICD-10-CM

## 2021-08-04 NOTE — Patient Instructions (Signed)
PLEASE FEEL FREE TO CALL OUR OFFICE WITH ANY PROBLEMS OR QUESTIONS ([phone])      

## 2021-08-04 NOTE — Progress Notes (Signed)
Subjective:    Patient ID: Alison Cuevas, female    DOB: Jun 17, 1946, 76 y.o.   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  I saw her in November when we performed Botox to her right triceps biceps and finger flexors totaling 400 units.  In October we had performed phenol neurolysis of the right tibial nerve  She is frustrated by continued tightness in her right leg.  She is using her JAS splint regularly.  Apparently she and her son argue about some of the positioning of the brace.  Son says that in order to get on her appropriately he does have to loosen it a bit first.  Patient also has a second brace that was recommended that causes her a bit of pain.  Is a posterior leaf brace with a lot of contact areas.  It appears as if some padding was added to the brace but still is causing some breakdown.  She is wearing her hips along brace today in the office.  It appears to be fitting appropriately.  She does do stretching as well on her own and exercises daily.  She walks additionally.    Pain Inventory Average Pain 6 Pain Right Now 6 My pain is constant, sharp, stabbing, tingling, and aching  In the last 24 hours, has pain interfered with the following? General activity 6 Relation with others 6 Enjoyment of life 6 What TIME of day is your pain at its worst? varies Sleep (in general) Fair  Pain is worse with: some activites Pain improves with: heat/ice and medication Relief from Meds: 7  Family History  Problem Relation Age of Onset   Thyroid disease Mother    Hypertension Mother    Diverticulitis Mother    Stomach cancer Father    Heart attack Father    Heart failure Father    Breast cancer Sister 66   Lupus Sister    Melanoma Brother    Prostate cancer Brother    Leukemia Brother    Lung disease Brother    Other Other        Family member with MGUS   Social History   Socioeconomic History   Marital status: Widowed     Spouse name: [name]   Number of children: 3   Years of education: Not on file   Highest education level: 9th grade  Occupational History   Occupation: retired  Tobacco Use   Smoking status: Never   Smokeless tobacco: Never  Vaping Use   Vaping Use: Never used  Substance and Sexual Activity   Alcohol use: No    Alcohol/week: 0.0 standard drinks   Drug use: No   Sexual activity: Not Currently    Birth control/protection: Surgical    Comment: hysterectomy  Other Topics Concern   Not on file  Social History Narrative   Widow (2022), has 3 children (one lives near her).  Has 8 grandchildren, 2 greatgrandchildren.  Worked cleaning apartments, worked at [employer], was a Secretary/administrator.She retired at age 66.No tobacco.  No alcohol.  No drugs.Exercise: clean, work in yard.  10 siblings in all--5 have passed away--1 child age 67 with whooping cough , 1 lupus, 1 melanoma,1 lung cancer, 1 pulm fibrosis   Social Determinants of Health   Financial Resource Strain: Low Risk    Difficulty of Paying Living Expenses: Not hard at all  Food Insecurity: No Food Insecurity   Worried About Running Out  of Food in the Last Year: Never true   [place] in the Last Year: Never true  Transportation Needs: No Transportation Needs   Lack of Transportation (Medical): No   Lack of Transportation (Non-Medical): No  Physical Activity: Insufficiently Active   Days of Exercise per Week: 2 days   Minutes of Exercise per Session: 30 min  Stress: No Stress Concern Present   Feeling of Stress : Not at all  Social Connections: Moderately Isolated   Frequency of Communication with Friends and Family: More than three times a week   Frequency of Social Gatherings with Friends and Family: Once a week   Attends Religious Services: 1 to 4 times per year   Active Member of [employer] or Organizations: No   Attends Archivist Meetings: Never   Marital Status: Widowed   Past Surgical History:   Procedure Laterality Date   ABDOMINAL HYSTERECTOMY  03/2015   TAH.  Last pap 2016.  No hx of abnl paps.  Per GYN, no further pap smears indicated.   ANTERIOR AND POSTERIOR REPAIR N/A 03/18/2014   Procedure: Cystocele repair with graft, Vault suspension, Rectocele repair;  Surgeon: [name], MD;  [facility]: [facility];  Service: Urology;  Laterality: N/A;   CHOLECYSTECTOMY     CHOLECYSTECTOMY, LAPAROSCOPIC     COLON SURGERY     COLONOSCOPY  04/2006; 05/26/16   2007 (Dr. [name]): Normal.  04/2016 (Dr. [name]) normal except diverticulosis and decreased anal sphincter tone.  No repeat colonoscopy is recommended due to age.   cspine surgery     Dr. Wiliam Ke level ant cerv discectomy [name] w/plating   CYSTO N/A 03/18/2014   Procedure: CYSTO;  Surgeon: [name], MD;  [facility];  Service: Urology;  Laterality: N/A;   DEXA  07/30/2015; 06/2018   2017 and 2020 -->Osteopenia--repeat 2 yrs. T score -2.29 Mar 2021. Rpt 2 yrs.   LYSIS OF ADHESION N/A 01/31/2018   Procedure: POSSIBLE LYSIS OF ADHESION;  Surgeon: Everitt Amber, MD;  [facility];  Service: Gynecology;  Laterality: N/A;   [place]     with prolasped bledder repair   RESECTION OF COLON     BENIGN TUMOR   RIGHT EAR SURGERY  08/23/2017   Dr. 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  Procedure: XI ROBOTIC ASSISTED BILATERAL SALPINGO OOPHORECTOMY;  Surgeon: Everitt Amber, MD;  [facility];  Service: Gynecology;   Laterality: Bilateral;   TRANSTHORACIC ECHOCARDIOGRAM  01/27/2020   EF 65-70%, NORMAL.   TUBAL LIGATION     Past Surgical History:  Procedure Laterality Date   ABDOMINAL HYSTERECTOMY  03/2015   TAH.  Last pap 2016.  No hx of abnl paps.  Per GYN, no further pap smears indicated.   ANTERIOR AND POSTERIOR REPAIR N/A 03/18/2014   Procedure: Cystocele repair with graft, Vault suspension, Rectocele repair;  Surgeon: [name], MD;  [facility];  Service: Urology;  Laterality: N/A;   CHOLECYSTECTOMY     CHOLECYSTECTOMY, LAPAROSCOPIC     COLON SURGERY  COLONOSCOPY  04/2006; 05/26/16   2007 (Dr. [name]): Normal.  04/2016 (Dr. [name]) normal except diverticulosis and decreased anal sphincter tone.  No repeat colonoscopy is recommended due to age.   cspine surgery     Dr. Wiliam Ke level ant cerv discectomy [name] w/plating   CYSTO N/A 03/18/2014   Procedure: CYSTO;  Surgeon: [name], MD;  [facility];  Service: Urology;  Laterality: N/A;   DEXA  07/30/2015; 06/2018   2017 and 2020 -->Osteopenia--repeat 2 yrs. T score -2.29 Mar 2021. Rpt 2 yrs.   LYSIS OF ADHESION N/A 01/31/2018   Procedure: POSSIBLE LYSIS OF ADHESION;  Surgeon: Everitt Amber, MD;  [facility];  Service: Gynecology;  Laterality: N/A;   [place]     with prolasped bledder repair   RESECTION OF COLON     BENIGN TUMOR   RIGHT EAR SURGERY  08/23/2017   Dr. 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   Procedure: XI ROBOTIC ASSISTED BILATERAL SALPINGO OOPHORECTOMY;  Surgeon: Everitt Amber, MD;  [facility];  Service: Gynecology;  Laterality: Bilateral;   TRANSTHORACIC ECHOCARDIOGRAM  01/27/2020   EF 65-70%, NORMAL.   TUBAL LIGATION     Past Medical History:  Diagnosis Date   Allergic rhinitis    maple pollen   Anxiety    Arthritis    Bowel obstruction (HCC)    Chronic low back pain    Dr. Trenton Gammon.  Has had back injection--bp elevated after.   Chronic pain syndrome    Chronic renal insufficiency, stage 2 (mild)    GFR 60-70   DDD (degenerative disc disease), cervical    Hx of ACDF (Dr. [name]).  Followed by Dr. [name].  Also, Dr. [name] do left C7-T1 intralaminal epidural injection.   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  Resid R arm and leg weakness + flexion contractures.   Hypercholesteremia    mild; pt declined statin trial 05/2014--needs recheck lipid panel at first f/u visit in 2017   Hypertension    Osteopenia 06/2017   T score -1.4 FRAX 15% / 2%   Ovarian cyst 2017   82019-robotic assisted bilatel SPO: all path benign.   Perianal dermatitis    prn cutivate   Phlebitis    Right hemiplegia ([place]) 01/2020   R arm and leg (hemorrhagic L cerebral hemisphere CVA)   Right knee injury 2018   Patellofemoral crush injury--Dr. [name].   Solitary thyroid nodule 05/2021   needs bx->plan endo ref   Stroke [facility])    Trochanteric bursitis of right hip    Recurrent (injection by Dr. 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 [name]). Recurrent fungal/bact OE.   Varicose vein    left leg    BP Marland Kitchen)  150/72    Pulse 86    LMP 06/27/1972    SpO2 95%    Opioid Risk Score:   Fall Risk Score:  `1  Depression screen PHQ 2/9  Depression screen [facility] 2/9 08/04/2021 07/14/2021 05/05/2021 10/14/2020 06/10/2020 03/04/2020  Decreased Interest 0 0 0 1 2 2   Down, Depressed, Hopeless 0 0 0 1 2 2   PHQ - 2 Score 0 0 0 2 4 4   Some recent data might be hidden     Review of Systems  Musculoskeletal:        Right side of body pain  Neurological:  Positive for weakness.  All other systems reviewed and are negative.     Objective:   Physical Exam  General: No acute distress HEENT: NCAT, EOMI, oral membranes moist Cards: reg rate  Chest: normal effort Abdomen: Soft, NT, ND Skin: dry, intact Extremities: no edema Psych: pleasant and appropriate   Neuro: Alert and oriented x 3. Normal insight and awareness. Intact Memory. Normal language and speech. Cranial nerve exam unremarkable. RUE 4+/5 with minimal resting tone.Marland Kitchen RLE 4/5 except for right heel cord tightness. Tone 2+/4 Right gastrocs and tib posterior Musculoskeletal: minimal pain with ROM, heel cord tight but I was able to reduce this to neutral today.         Assessment & Plan:  1. 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              2.     HTN:                per primary          3. Spasticity:             -RUE much improved and really her RLE has as well  -still some extensor tone in right heel cord  -will arrange for phenol to right tibial nerve    4 History of small volume rectal bleeding likely traumatic due to hard stool             - moving bowels regularly             -f/u per primary       15 minutes of face to face patient care time were spent during this visit. All questions were encouraged and answered.  Follow up with me in about  2 mos phenol.

## 2021-08-05 ENCOUNTER — Ambulatory Visit (INDEPENDENT_AMBULATORY_CARE_PROVIDER_SITE_OTHER): Payer: Medicare Other | Admitting: Family Medicine

## 2021-08-05 ENCOUNTER — Encounter: Payer: Self-pay | Admitting: Family Medicine

## 2021-08-05 VITALS — BP 117/69 | HR 76 | Temp 97.5°F | Ht 60.0 in | Wt 116.4 lb

## 2021-08-05 DIAGNOSIS — I6930 Unspecified sequelae of cerebral infarction: Secondary | ICD-10-CM

## 2021-08-05 DIAGNOSIS — M79604 Pain in right leg: Secondary | ICD-10-CM | POA: Diagnosis not present

## 2021-08-05 DIAGNOSIS — G894 Chronic pain syndrome: Secondary | ICD-10-CM

## 2021-08-05 DIAGNOSIS — I10 Essential (primary) hypertension: Secondary | ICD-10-CM

## 2021-08-05 MED ORDER — HYDROCODONE-ACETAMINOPHEN 5-325 MG PO TABS: ORAL_TABLET | ORAL | 0 refills | Status: DC

## 2021-08-05 NOTE — Progress Notes (Signed)
OFFICE VISIT  08/05/2021  CC: f/u HTN, hx of cva, chronic pain  HPI:    Patient is a 76 y.o. 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 A/P as of last visit: "1) Hx of hemorrhagic CVA with residual R LE spastic hemiplegia. Symptoms are well controlled. She continues to ambulate with her walker well. She receives botox injections for spasticity.  Continue to use Zanaflex bid as needed. Patient will follow up with Dr. Ephriam Knuckles about AFO causing discomfort in her foot.    2) Chronic pain syndrome: stable.  Vicodin prn, she continues uses sparingly/appropriately. Encouraged patient to take entire pill as she has only been taking them in halves.    3) HTN: controlled. Mildly elevated BP (147/84, 149/79) the past two days; however, at home BP are unremarkable. Asked the patient to continue recording at home pressures and to schedule appt sooner if pressures are consistently elevated.   4) Anxiety: was well controlled, but patient is currently grieving. Continue to take alprazolam tid prn long term. Patient is aware of signs for when medication might be needed for her grieving.   5) Chronic hyponatremia. Following Na today with CMP.   6) Osteopenia: Stable. Last DEXA 04/13/21 showed a T-score of -2.3. This retains the patient in the osteopenia category. No medication required. Vitamin D level ordered for evaluation of patients candidacy for bisphosphonate therapy.   7.) R Knee buckling: R knee seems to be stable due to non-restricted range of motion and lack of pain. Will consider imaging if problem persist.  INTERIM HX: Alison Cuevas is doing okay, just feeling lonely.  She gets checked in on by family regularly.  She uses a walker.  Wears AFO brace right lower leg, followed by Dr. Tessa Lerner.  He evaluated her yesterday and plans on arranging  phenol injections to right tibial nerve.  Alison Cuevas is constantly in pain but most the time it is to a tolerable level with just use of Tylenol and muscle relaxer.  Described as a ache in entire right leg starting at the hip, focused mostly in lower legs though.  Also some more of tingly discomfort in right leg but not clearly ashooting/stabbing/sharp pain.  She does add hydrocodone tab in occasionally but is hesitant to do this much because of the constipation it causes.  Her usual bowel habits typically alternate from constipation to diarrhea, largely depending on what she eats. MiraLAX basically causes too much diarrhea in the past so she avoids this.  No home blood pressure monitoring.   PMP AWARE reviewed today: most recent rx for alpraz was filled 05/13/21, # 68, rx by me. Most recent vicodin rx filled 02/15/21, #45, rx by me. No red flags.  Past Medical History:  Diagnosis Date   Allergic rhinitis    maple pollen   Anxiety    Arthritis    Bowel obstruction (HCC)    Chronic low back pain    Dr. Trenton Gammon.  Has had back injection--bp elevated after.   Chronic pain syndrome    Chronic renal insufficiency, stage 2 (mild)    GFR 60-70   DDD (degenerative disc disease), cervical    Hx of ACDF (Dr. [name]).  Followed by Dr. [name].  Also, Dr. [name] do left C7-T1 intralaminal epidural injection.   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  Resid R arm and leg weakness + flexion contractures.   Hypercholesteremia    mild; pt declined statin trial 05/2014--needs recheck lipid panel at first f/u visit in 2017   Hypertension    Osteopenia 06/2017   T score -1.4 FRAX 15% / 2%   Ovarian cyst 2017   82019-robotic assisted bilatel SPO: all path benign.    Perianal dermatitis    prn cutivate   Phlebitis    Right hemiplegia ([place]) 01/2020   R arm and leg (hemorrhagic L cerebral hemisphere CVA)   Right knee injury 2018   Patellofemoral crush injury--Dr. [name].   Solitary thyroid nodule 05/2021   needs bx->plan endo ref   Stroke [facility])    Trochanteric bursitis of right hip    Recurrent (injection by Dr. 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 [name]). Recurrent fungal/bact OE.   Varicose vein    left leg     Past Surgical History:  Procedure Laterality Date   ABDOMINAL HYSTERECTOMY  03/2015   TAH.  Last pap 2016.  No hx of abnl paps.  Per GYN, no further pap smears indicated.   ANTERIOR AND POSTERIOR REPAIR N/A 03/18/2014   Procedure: Cystocele repair with graft, Vault suspension, Rectocele repair;  Surgeon: [name], MD;  [facility];  Service: Urology;  Laterality: N/A;   CHOLECYSTECTOMY     CHOLECYSTECTOMY, LAPAROSCOPIC     COLON SURGERY     COLONOSCOPY  04/2006; 05/26/16   2007 (Dr. [name]): Normal.  04/2016 (Dr. [name]) normal except diverticulosis and decreased anal sphincter tone.  No repeat colonoscopy is recommended due to age.   cspine surgery     Dr. Wiliam Ke level ant cerv discectomy [name] w/plating   CYSTO N/A 03/18/2014   Procedure: CYSTO;  Surgeon: [name], MD;  [facility];  Service: Urology;  Laterality: N/A;   DEXA  07/30/2015; 06/2018   2017 and 2020 -->Osteopenia--repeat 2 yrs. T score -2.29 Mar 2021. Rpt 2 yrs.   LYSIS OF ADHESION N/A 01/31/2018   Procedure: POSSIBLE LYSIS OF ADHESION;  Surgeon: Everitt Amber, MD;  [facility]: [facility];  Service: Gynecology;  Laterality: N/A;   [place]     with prolasped bledder repair   RESECTION OF COLON     BENIGN TUMOR   RIGHT EAR SURGERY  08/23/2017   Dr. 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  Procedure: XI ROBOTIC ASSISTED BILATERAL SALPINGO OOPHORECTOMY;  Surgeon: Everitt Amber, MD;  [facility];  Service: Gynecology;  Laterality: Bilateral;   TRANSTHORACIC ECHOCARDIOGRAM  01/27/2020   EF 65-70%, NORMAL.   TUBAL LIGATION      Outpatient Medications Prior to Visit  Medication Sig Dispense Refill   acetaminophen (TYLENOL) 500 MG tablet Take 1 tablet (500 mg total) by mouth 3 (  three) times daily. 30 tablet 0   ALPRAZolam (XANAX) 0.5 MG tablet TAKE 1 TABLET BY MOUTH THREE TIMES A DAY AS NEEDED FOR ANXIETY 90 tablet 5   aspirin EC 81 MG tablet Take 1 tablet (81 mg total) by mouth daily. Swallow whole. 30 tablet 11   Biotin 5 MG TABS Take 5 mg by mouth daily.     fluticasone (FLONASE) 50 MCG/ACT nasal spray Place 2 sprays into both nostrils at bedtime. 48 g 3   ibuprofen (ADVIL) 200 MG tablet Take 200 mg by mouth every 6 (six) hours as needed.     KLOR-CON M20 20 MEQ tablet TAKE 1 TABLET BY MOUTH EVERY DAY 90 tablet 1   lisinopril (ZESTRIL) 5 MG tablet TAKE 1 TABLET BY MOUTH EVERY DAY 90 tablet 1   loratadine (CLARITIN) 10 MG tablet Take 10 mg by mouth every other day.      Multiple Minerals-Vitamins (CALCIUM-MAGNESIUM-ZINC-D3 PO) Take 1 tablet by mouth daily.      nystatin ointment (MYCOSTATIN) Apply 1 application topically 2 (two) times daily. Apply to affected area for up to 7 days. 30 g 0   pantoprazole (PROTONIX) 40 MG tablet TAKE 1 TABLET BY MOUTH EVERYDAY AT BEDTIME 90 tablet 0   polyethylene glycol (MIRALAX / GLYCOLAX) 17 g packet Take 17 g  by mouth daily as needed.     Sodium Fluoride (CLINPRO 5000) 1.1 % PSTE Clinpro 5000 1.1 % dental paste     tiZANidine (ZANAFLEX) 2 MG tablet TAKE 1 TABLET BY MOUTH 3 TIMES DAILY AS NEEDED FOR MUSCLE RELAXATION 270 tablet 0   triamcinolone ointment (KENALOG) 0.1 % triamcinolone acetonide 0.1 % topical ointment     verapamil (CALAN-SR) 180 MG CR tablet TAKE 1 TABLET BY MOUTH EVERY DAY 90 tablet 1   HYDROcodone-acetaminophen (NORCO/VICODIN) 5-325 MG tablet 1/2-1 tab po q6h prn pain 45 tablet 0   meclizine (ANTIVERT) 12.5 MG tablet Take 1 tablet (12.5 mg total) by mouth 3 (three) times daily. (Patient not taking: Reported on 08/05/2021) 30 tablet 0   No facility-administered medications prior to visit.    Allergies  Allergen Reactions   Gabapentin Anxiety    Elevated heart rate and crazy dreams   Prednisone Anxiety and Other (See Comments)    REACTION: funny feeling    ROS As per HPI  PE: Vitals with BMI 08/05/2021 08/04/2021 07/28/2021  Height 5\' 0"  (No Data) 5\' 0"   Weight 116 lbs 6 oz (No Data) 116 lbs  BMI 81.19 - 14.78  Systolic 295 621 308  Diastolic 69 72 72  Pulse 76 86 72     Physical Exam  Gen: Alert, well appearing.  Patient is oriented to person, place, time, and situation. AFFECT: pleasant, lucid thought and speech. CV: RRR, no m/r/g.   LUNGS: CTA bilat, nonlabored resps, good aeration in all lung fields.   LABS:  Last CBC Lab Results  Component Value Date   WBC 5.9 11/06/2020   HGB 12.6 11/06/2020   HCT 36.0 11/06/2020   MCV 88.7 11/06/2020   MCH 31.0 11/06/2020   RDW 12.0 11/06/2020   PLT 219 65/78/4696   Last metabolic panel Lab Results  Component Value Date   GLUCOSE 83 05/06/2021   NA 133 (L) 05/06/2021   K 4.0 05/06/2021   CL 98 05/06/2021   CO2 27 05/06/2021   BUN 12 05/06/2021   CREATININE 0.67 05/06/2021   GFRNONAA >60 06/25/2020   CALCIUM 9.5 05/06/2021   PROT 6.8 05/06/2021  ALBUMIN 4.3 05/06/2021   BILITOT 0.8 05/06/2021   ALKPHOS 64  05/06/2021   AST 23 05/06/2021   ALT 16 05/06/2021   ANIONGAP 10 06/25/2020   Last lipids Lab Results  Component Value Date   CHOL 163 01/28/2020   HDL 75 01/28/2020   LDLCALC 81 01/28/2020   LDLDIRECT 136.9 09/20/2007   TRIG 33 01/28/2020   CHOLHDL 2.2 01/28/2020   Last hemoglobin A1c Lab Results  Component Value Date   HGBA1C 5.0 01/28/2020   Last thyroid functions Lab Results  Component Value Date   TSH 1.08 09/25/2019   T3TOTAL 111 09/25/2019   T4TOTAL 8.1 11/21/2013   Last vitamin D Lab Results  Component Value Date   VD25OH 85.58 05/06/2021   Last vitamin B12 and Folate Lab Results  Component Value Date   VITAMINB12 >2,000 (H) 12/19/2017   IMPRESSION AND PLAN:  #1 hypertension, well controlled on lisinopril 5 mg a day.  2. 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  She continues to ambulate with her walker well. She has AFO, receives botox injections for spasticity.  Continue to use Zanaflex bid as needed. Patient will follow up with Dr. Ephriam Knuckles --phenol inj R tibial nerve being arranged by him.  #3 chronic right leg pain.  Combination of spasticity pain plus neuropathic pain. She limits use of pain medications mostly due to fear of sedation, constipation, does not like medications, etc. Encouraged her to use the Vicodin 1/2-1 tab every 6 hours as needed.  She is careful to avoid using Zanaflex at the same time as her hydrocodone. Anti-inflammatories contraindicated in the setting of history of hemorrhagic stroke.  An After Visit Summary was printed and given to the patient.  FOLLOW UP: Return in about 3 months (around 11/02/2021).  Signed:  Crissie Sickles, MD           08/05/2021

## 2021-08-09 ENCOUNTER — Telehealth: Payer: Self-pay

## 2021-08-09 NOTE — Telephone Encounter (Signed)
° °  Telephone encounter was:  Successful.  08/09/2021 Name: Alison Cuevas MRN: [id number] DOB: 02/24/1946  Alison Cuevas is a 76 y.o. year old female who is a primary care patient of McGowen, Adrian Blackwater, MD . The community resource team was consulted for assistance with  Food and caregiver support  Care guide performed the following interventions:   Pt would like all communication by mail. Pt is looking for aid in housekeeping, helping with meals, meals on wheels and bathing. Pt advised she believes her daughter added her to the meals on wheels list. CG will outreach for resources .  Follow Up Plan:  Care guide will outreach resources to assist patient with in home care support  [place], [place]  Main Phone: [phone]   E-mail: [email or web address]  Website: [email or web address]

## 2021-08-11 ENCOUNTER — Other Ambulatory Visit: Payer: Medicare Other

## 2021-08-19 ENCOUNTER — Telehealth: Payer: Self-pay

## 2021-08-19 NOTE — Telephone Encounter (Signed)
° °  Telephone encounter was:  Successful.  08/19/2021 Name: Alison Cuevas MRN: [id number] DOB: March 22, 1946  Alison Cuevas is a 76 y.o. year old female who is a primary care patient of McGowen, Adrian Blackwater, MD . The community resource team was consulted for assistance with Caregiver Stress and Meals on Wheels  Care guide performed the following interventions:  Spoke to pt and advised her of the mailing that I would be sending (Home health/in-home health agencies, private duty care agencies, and care services support surrounding [place] (13 total)). Pt is aware I will contact her again to ensure mail has been received. Per last call, pt advised she was not sure if she was on meals on wheels list; I called and she was not. However, I was able to add opt today 08/19/21, with the coordinator [place]  . At this time, pt does not have any further questions or concerns.  Follow Up Plan:  Care guide will follow up with patient by phone over the next few days to ensure mail has bee received.  [facility] management  [place], [place]  Main Phone: [phone]   E-mail: [email or web address]  Website: [email or web address]

## 2021-08-27 ENCOUNTER — Telehealth: Payer: Self-pay | Admitting: Family Medicine

## 2021-08-27 MED ORDER — HYDROCODONE-ACETAMINOPHEN 10-325 MG PO TABS: 1.0000 | ORAL_TABLET | Freq: Four times a day (QID) | ORAL | 0 refills | Status: DC | PRN

## 2021-08-27 NOTE — Telephone Encounter (Signed)
Last refill for Norco 5-325mg  on 08/05/21(45,0) to [facility] ? ?They have Norco 7-325 and 10-325mg  strength available. Confirmed they have enough to complete equivalent qty of 45 for 5-325mg  tabs. Please review and advise ? ?

## 2021-08-27 NOTE — Telephone Encounter (Signed)
Pt daughter called [facility] several pharmacies. ?They have a shortage on medication below: ? ?HYDROcodone-acetaminophen ?HYDROcodone-acetaminophen (NORCO/VICODIN) 5-325 MG tablet ? ?[facility] a different strength of medication, however they are saying that Doctor would need to authorize it.  ? ? ? ?[id number] [place] [place] Phone:  [phone]  ?Fax:  [phone]  ?  ? ?

## 2021-08-27 NOTE — Telephone Encounter (Signed)
New prescription for the 10-325 mg Norco sent ?

## 2021-08-27 NOTE — Telephone Encounter (Addendum)
Pt advised new rx sent. LM for pt's daughter to advise of update. ?

## 2021-08-30 NOTE — Telephone Encounter (Signed)
Rx picked on 08/28/21, confirmed with pharmacy. ?

## 2021-08-30 NOTE — Telephone Encounter (Signed)
Pt advised of rx recommendations. ?

## 2021-08-30 NOTE — Telephone Encounter (Signed)
Pt would like to confirm if she should stop taking any medications while taking Norco 10-'325mg'$  including muscle relaxer. ? ? ?Please review and advise ? ?

## 2021-08-30 NOTE — Telephone Encounter (Signed)
It would be best if she can avoid taking her alprazolam or muscle relaxer at the same time as the hydrocodone pill.  This is to avoid oversedation. ?

## 2021-08-31 ENCOUNTER — Telehealth: Payer: Self-pay

## 2021-08-31 NOTE — Telephone Encounter (Signed)
? ?  Telephone encounter was:  Successful.  ?08/31/2021 ?Name: Naiomy Watters MRN: [id number] DOB: [birth year] ? ?Tiffini Blacksher is a 76 y.o. year old female who is a primary care patient of McGowen, Adrian Blackwater, MD . The community resource team was consulted for assistance with Caregiver Stress/In home-Care ? ?Care guide performed the following interventions:  Pt advised that she did receive the in-home care documents in mail and that she will be going over the documents I sent with her Daughter. At this time, there are no further questions or concerns and pt will call in if needed. ? ?Follow Up Plan:  No further follow up planned at this time. The patient has been provided with needed resources. ? ?Cameron Proud ?Care Guide, Embedded Care Coordination ?[facility]  Care management  ?[place], [place]  ?Main Phone: [phone]  E-mail: [email or web address]  ?Website: [email or web address] ? ? ? ?

## 2021-09-04 ENCOUNTER — Other Ambulatory Visit: Payer: Self-pay

## 2021-09-04 ENCOUNTER — Emergency Department (HOSPITAL_COMMUNITY): Payer: Medicare Other

## 2021-09-04 ENCOUNTER — Emergency Department (HOSPITAL_COMMUNITY)
Admission: EM | Admit: 2021-09-04 | Discharge: 2021-09-04 | Disposition: A | Discharge status: Home / Self Care | Payer: Medicare Other | Attending: Emergency Medicine | Admitting: Emergency Medicine

## 2021-09-04 ENCOUNTER — Encounter (HOSPITAL_COMMUNITY): Payer: Self-pay | Admitting: Emergency Medicine

## 2021-09-04 DIAGNOSIS — R519 Headache, unspecified: Secondary | ICD-10-CM | POA: Insufficient documentation

## 2021-09-04 DIAGNOSIS — Z79899 Other long term (current) drug therapy: Secondary | ICD-10-CM | POA: Insufficient documentation

## 2021-09-04 DIAGNOSIS — E871 Hypo-osmolality and hyponatremia: Secondary | ICD-10-CM | POA: Insufficient documentation

## 2021-09-04 DIAGNOSIS — D649 Anemia, unspecified: Secondary | ICD-10-CM | POA: Insufficient documentation

## 2021-09-04 DIAGNOSIS — Z20822 Contact with and (suspected) exposure to covid-19: Secondary | ICD-10-CM | POA: Diagnosis not present

## 2021-09-04 DIAGNOSIS — Z7982 Long term (current) use of aspirin: Secondary | ICD-10-CM | POA: Diagnosis not present

## 2021-09-04 DIAGNOSIS — R000 Tachycardia, unspecified: Secondary | ICD-10-CM | POA: Diagnosis not present

## 2021-09-04 DIAGNOSIS — R5383 Other fatigue: Secondary | ICD-10-CM | POA: Diagnosis not present

## 2021-09-04 DIAGNOSIS — I10 Essential (primary) hypertension: Secondary | ICD-10-CM | POA: Insufficient documentation

## 2021-09-04 DIAGNOSIS — M791 Myalgia, unspecified site: Secondary | ICD-10-CM | POA: Diagnosis not present

## 2021-09-04 DIAGNOSIS — R531 Weakness: Secondary | ICD-10-CM | POA: Diagnosis not present

## 2021-09-04 LAB — BASIC METABOLIC PANEL
Anion gap: 9 (ref 5–15)
BUN: 10 mg/dL (ref 8–23)
CO2: 23 mmol/L (ref 22–32)
Calcium: 9.1 mg/dL (ref 8.9–10.3)
Chloride: 96 mmol/L — ABNORMAL LOW (ref 98–111)
Creatinine, Ser: 0.58 mg/dL (ref 0.44–1.00)
GFR, Estimated: >60 mL/min (ref >60)
Glucose, Bld: 116 mg/dL — ABNORMAL HIGH (ref 70–99)
Potassium: 4.3 mmol/L (ref 3.5–5.1)
Sodium: 128 mmol/L — ABNORMAL LOW (ref 135–145)

## 2021-09-04 LAB — CBC
HCT: 33.5 % — ABNORMAL LOW (ref 36.0–46.0)
Hemoglobin: 11.6 g/dL — ABNORMAL LOW (ref 12.0–15.0)
MCH: 30.9 pg (ref 26.0–34.0)
MCHC: 34.6 g/dL (ref 30.0–36.0)
MCV: 89.1 fL (ref 80.0–100.0)
Platelets: 183 10*3/uL (ref 150–400)
RBC: 3.76 MIL/uL — ABNORMAL LOW (ref 3.87–5.11)
RDW: 12.4 % (ref 11.5–15.5)
WBC: 5.7 10*3/uL (ref 4.0–10.5)
nRBC: 0 % (ref 0.0–0.2)

## 2021-09-04 LAB — RESP PANEL BY RT-PCR (FLU A&B, COVID) ARPGX2
Influenza A by PCR: NEGATIVE
Influenza B by PCR: NEGATIVE
SARS Coronavirus 2 by RT PCR: NEGATIVE

## 2021-09-04 MED ORDER — SODIUM CHLORIDE 0.9 % IV BOLUS (SEPSIS)
500.0000 mL | Freq: Once | INTRAVENOUS | Status: AC
  Administered 2021-09-04: 500 mL via INTRAVENOUS

## 2021-09-04 MED ORDER — SODIUM CHLORIDE 0.9 % IV SOLN
1000.0000 mL | INTRAVENOUS | Status: DC
  Administered 2021-09-04: 1000 mL via INTRAVENOUS

## 2021-09-04 MED ORDER — MORPHINE SULFATE (PF) 4 MG/ML IV SOLN
4.0000 mg | Freq: Once | INTRAVENOUS | Status: AC
  Administered 2021-09-04: 4 mg via INTRAVENOUS
  Filled 2021-09-04: qty 1

## 2021-09-04 MED ORDER — ONDANSETRON HCL 4 MG/2ML IJ SOLN
4.0000 mg | Freq: Once | INTRAMUSCULAR | Status: AC
  Administered 2021-09-04: 4 mg via INTRAVENOUS
  Filled 2021-09-04: qty 2

## 2021-09-04 MED ORDER — MELOXICAM 7.5 MG PO TABS: 7.5000 mg | ORAL_TABLET | Freq: Every day | ORAL | 0 refills | Status: AC

## 2021-09-04 NOTE — ED Triage Notes (Signed)
Patient c/o generalized fatigue with right ear pain and headache that started late yesterday afternoon (1700). Denies any drainage from ear. 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 Per family patient's blood pressure 150/81 and HR 101 at home. Patient also c/o neck pain. Patient took 'half a hydrocodone" with no relief. Denies dizziness, slurred speech, or facial drooping.  ?

## 2021-09-04 NOTE — Discharge Instructions (Signed)
The medications as needed for pain.  Follow-up with your doctor to be rechecked. ?

## 2021-09-04 NOTE — ED Provider Notes (Signed)
[facility] Provider Note   CSN: [id number] Arrival date & time: 09/04/21  1433     History  Chief Complaint  Patient presents with   Fatigue    Alison Cuevas is a 76 y.o. female.  HPI  Patient has a history of reflux, degenerative joint disease, anxiety, hypertension, chronic low back pain, arthritis, bowel obstruction, degenerative disc disease, chronic renal insufficiency, stroke with residual right-sided deficits, chronic pain syndrome and fibromyalgia.  Patient presents to the ED with complaints of generalized fatigue as well as headache.  She is also having diffuse body pain.  Patient states she is hurting in her head and neck as well as is her extremities and all throughout.  She denies any fevers.  No vomiting or diarrhea.  No urinary discomfort.  Appetite has been diminished.  Home Medications Prior to Admission medications   Medication Sig Start Date End Date Taking? Authorizing Provider  meloxicam (MOBIC) 7.5 MG tablet Take 1 tablet (7.5 mg total) by mouth daily for 14 days. 09/04/21 09/18/21 Yes Dorie Rank, MD  acetaminophen (TYLENOL) 500 MG tablet Take 1 tablet (500 mg total) by mouth 3 (three) times daily. 02/19/20   Love, Ivan Anchors, PA-C  ALPRAZolam (XANAX) 0.5 MG tablet TAKE 1 TABLET BY MOUTH THREE TIMES A DAY AS NEEDED FOR ANXIETY 05/13/21   McGowen, Adrian Blackwater, MD  aspirin EC 81 MG tablet Take 1 tablet (81 mg total) by mouth daily. Swallow whole. 04/20/20   Frann Rider, NP  Biotin 5 MG TABS Take 5 mg by mouth daily.    [provider]  fluticasone (FLONASE) 50 MCG/ACT nasal spray Place 2 sprays into both nostrils at bedtime. 05/17/16   McGowen, Adrian Blackwater, MD  HYDROcodone-acetaminophen (NORCO) 10-325 MG tablet Take 1 tablet by mouth every 6 (six) hours as needed. 08/27/21   McGowen, Adrian Blackwater, MD  ibuprofen (ADVIL) 200 MG tablet Take 200 mg by mouth every 6 (six) hours as needed.    [provider]  KLOR-CON M20 20 MEQ tablet TAKE 1  TABLET BY MOUTH EVERY DAY 04/23/21   McGowen, Adrian Blackwater, MD  lisinopril (ZESTRIL) 5 MG tablet TAKE 1 TABLET BY MOUTH EVERY DAY 04/23/21   McGowen, Adrian Blackwater, MD  loratadine (CLARITIN) 10 MG tablet Take 10 mg by mouth every other day.     [provider]  meclizine (ANTIVERT) 12.5 MG tablet Take 1 tablet (12.5 mg total) by mouth 3 (three) times daily. Patient not taking: Reported on 08/05/2021 02/19/20   Bary Leriche, PA-C  Multiple Minerals-Vitamins (CALCIUM-MAGNESIUM-ZINC-D3 PO) Take 1 tablet by mouth daily.     [provider]  nystatin ointment (MYCOSTATIN) Apply 1 application topically 2 (two) times daily. Apply to affected area for up to 7 days. 10/20/20   Karma Ganja, NP  pantoprazole (PROTONIX) 40 MG tablet TAKE 1 TABLET BY MOUTH EVERYDAY AT BEDTIME 08/02/21   McGowen, Adrian Blackwater, MD  polyethylene glycol (MIRALAX / GLYCOLAX) 17 g packet Take 17 g by mouth daily as needed.    [provider]  Sodium Fluoride (CLINPRO 5000) 1.1 % PSTE Clinpro 5000 1.1 % dental paste    [provider]  tiZANidine (ZANAFLEX) 2 MG tablet TAKE 1 TABLET BY MOUTH 3 TIMES DAILY AS NEEDED FOR MUSCLE RELAXATION 06/29/21   McGowen, Adrian Blackwater, MD  triamcinolone ointment (KENALOG) 0.1 % triamcinolone acetonide 0.1 % topical ointment    [provider]  verapamil (CALAN-SR) 180 MG CR tablet TAKE 1 TABLET BY  MOUTH EVERY DAY 07/27/21   McGowen, Adrian Blackwater, MD      Allergies    Gabapentin and Prednisone    Review of Systems   Review of Systems  Constitutional:  Negative for fever.   Physical Exam Updated Vital Signs BP (!) 167/75    Pulse (!) 117    Temp 97.8 F (36.6 C) (Oral)    Resp (!) 27    Ht 1.524 m (5')    Wt 52.6 kg    LMP 06/27/1972    SpO2 94%    BMI 22.65 kg/m  Physical Exam Vitals and nursing note reviewed.  Constitutional:      Appearance: She is not diaphoretic.     Comments: Elderly, frail  HENT:     Head: Normocephalic and atraumatic.     Comments: Signs of  perforated TM right ear, no erythema    Right Ear: External ear normal.     Left Ear: External ear normal.  Eyes:     General: No scleral icterus.       Right eye: No discharge.        Left eye: No discharge.     Conjunctiva/sclera: Conjunctivae normal.  Neck:     Trachea: No tracheal deviation.  Cardiovascular:     Rate and Rhythm: Normal rate and regular rhythm.  Pulmonary:     Effort: Pulmonary effort is normal. No respiratory distress.     Breath sounds: Normal breath sounds. No stridor. No wheezing or rales.  Abdominal:     General: Bowel sounds are normal. There is no distension.     Palpations: Abdomen is soft.     Tenderness: There is no abdominal tenderness. There is no guarding or rebound.  Musculoskeletal:        General: No tenderness or deformity.     Cervical back: Neck supple.  Skin:    General: Skin is warm and dry.     Findings: No rash.  Neurological:     General: No focal deficit present.     Cranial Nerves: No cranial nerve deficit (no facial droop, extraocular movements intact, no slurred speech).     Sensory: No sensory deficit.     Motor: No abnormal muscle tone or seizure activity.     Coordination: Coordination normal.     Comments: Right-sided weakness of her upper extremity and lower extremity, patient states this is chronic from her old stroke  Psychiatric:        Mood and Affect: Mood normal.    ED Results / Procedures / Treatments   Labs (all labs ordered are listed, but only abnormal results are displayed) Labs Reviewed  CBC - Abnormal; Notable for the following components:      Result Value   RBC 3.76 (*)    Hemoglobin 11.6 (*)    HCT 33.5 (*)    All other components within normal limits  BASIC METABOLIC PANEL - Abnormal; Notable for the following components:   Sodium 128 (*)    Chloride 96 (*)    Glucose, Bld 116 (*)    All other components within normal limits  RESP PANEL BY RT-PCR (FLU A&B, COVID) ARPGX2    EKG EKG  Interpretation  Date/Time:  Saturday September 04 2021 15:19:06 EST Ventricular Rate:  102 PR Interval:  170 QRS Duration: 142 QT Interval:  379 QTC Calculation: 494 R Axis:   13 Text Interpretation: Sinus tachycardia Left bundle branch block Artifact in lead(s) I II aVR aVL aVF  V1 Poor data quality LBBB present on prior ECG Confirmed by Dorie Rank [phone]) on 09/04/2021 3:28:28 PM  Radiology CT Head Wo Contrast  Result Date: 09/04/2021 CLINICAL DATA:  Headache, neck pain, right ear pain, and fatigue. EXAM: CT HEAD WITHOUT CONTRAST TECHNIQUE: Contiguous axial images were obtained from the base of the skull through the vertex without intravenous contrast. RADIATION DOSE REDUCTION: This exam was performed according to the departmental dose-optimization program which includes automated exposure control, adjustment of the mA and/or kV according to patient size and/or use of iterative reconstruction technique. COMPARISON:  Head CT 05/16/2021 and MRI 04/16/2020 FINDINGS: Brain: There is no evidence of an acute infarct, intracranial hemorrhage, mass, midline shift, or extra-axial fluid collection. There is unchanged encephalomalacia in the left frontoparietal region corresponding to the site of a remote hemorrhage. Hypodensities elsewhere in the cerebral white matter bilaterally are unchanged and nonspecific but compatible with moderate chronic small vessel ischemic disease. Small chronic bilateral cerebellar infarcts are again noted. Mild cerebral atrophy is within normal limits for age. Vascular: Calcified atherosclerosis at the skull base. No hyperdense vessel. Skull: No fracture or suspicious osseous lesion. Sinuses/Orbits: Chronic left sphenoid sinusitis with unchanged complete sinus opacification. Prior right mastoidectomy. Unremarkable orbits. Other: None. IMPRESSION: 1. No evidence of acute intracranial abnormality. 2. Moderate chronic small vessel ischemic disease. Electronically Signed   By: Logan Bores  M.D.   On: 09/04/2021 16:23   DG Chest Portable 1 View  Result Date: 09/04/2021 CLINICAL DATA:  76 year old female with weakness. EXAM: PORTABLE CHEST 1 VIEW COMPARISON:  01/20/2021 and prior radiographs FINDINGS: The cardiomediastinal silhouette is unremarkable. There is no evidence of focal airspace disease, pulmonary edema, suspicious pulmonary nodule/mass, pleural effusion, or pneumothorax. No acute bony abnormalities are identified. Cervical spine fusion hardware again noted. IMPRESSION: No active disease. Electronically Signed   By: Margarette Canada M.D.   On: 09/04/2021 15:51    Procedures Procedures    Medications Ordered in ED Medications  sodium chloride 0.9 % bolus 500 mL (0 mLs Intravenous Stopped 09/04/21 1824)    Followed by  0.9 %  sodium chloride infusion (1,000 mLs Intravenous New Bag/Given 09/04/21 1625)  morphine (PF) 4 MG/ML injection 4 mg (4 mg Intravenous Given 09/04/21 1621)  ondansetron (ZOFRAN) injection 4 mg (4 mg Intravenous Given 09/04/21 1621)    ED Course/ Medical Decision Making/ A&P Clinical Course as of 09/04/21 1834  Sat Sep 04, 2021  4854 Basic metabolic panel(!) Sodium level slightly decreased to 128 from 133 previously [JK]  1715 CBC(!) Mild anemia noted [JK]  1715 DG Chest Portable 1 View Chest x-ray images and radiology report reviewed.  No pneumonia [JK]  1715 CT Head Wo Contrast Head CT without acute findings [JK]    Clinical Course User Index [JK] Dorie Rank, MD                           Medical Decision Making Amount and/or Complexity of Data Reviewed Labs: ordered. Decision-making details documented in ED Course. Radiology: ordered. Decision-making details documented in ED Course.  Risk Prescription drug management.   Patient presented to ED for evaluation of headache myalgias earache.  Assessed for possibility of cerebral hemorrhage with her head CT no acute findings noted.  Considered otitis media and infection but laboratory tests are  reassuring and exam is unremarkable.  She does not have COVID or the flu.  No pneumonia.  Patient does have history of some chronic headaches.  She also has history of having fibromyalgia.  I suspect this could be causing some of her symptoms.  Patient had been taking meloxicam which had helped in the past but she has stopped.  We will have her restart that.  Evaluation and diagnostic testing in the emergency department does not suggest an emergent condition requiring admission or immediate intervention beyond what has been performed at this time.  The patient is safe for discharge and has been instructed to return immediately for worsening symptoms, change in symptoms or any other concerns.         Final Clinical Impression(s) / ED Diagnoses Final diagnoses:  Myalgia  Intractable headache, unspecified chronicity pattern, unspecified headache type    Rx / DC Orders ED Discharge Orders          Ordered    meloxicam (MOBIC) 7.5 MG tablet  Daily        09/04/21 1832              Dorie Rank, MD 09/04/21 [phone]

## 2021-10-12 ENCOUNTER — Ambulatory Visit: Payer: Medicare Other | Admitting: Adult Health

## 2021-10-12 DIAGNOSIS — Z9889 Other specified postprocedural states: Secondary | ICD-10-CM | POA: Diagnosis not present

## 2021-10-12 DIAGNOSIS — H90A31 Mixed conductive and sensorineural hearing loss, unilateral, right ear with restricted hearing on the contralateral side: Secondary | ICD-10-CM | POA: Diagnosis not present

## 2021-10-12 DIAGNOSIS — H7291 Unspecified perforation of tympanic membrane, right ear: Secondary | ICD-10-CM | POA: Diagnosis not present

## 2021-10-12 DIAGNOSIS — Z8669 Personal history of other diseases of the nervous system and sense organs: Secondary | ICD-10-CM | POA: Diagnosis not present

## 2021-10-12 DIAGNOSIS — H90A22 Sensorineural hearing loss, unilateral, left ear, with restricted hearing on the contralateral side: Secondary | ICD-10-CM | POA: Diagnosis not present

## 2021-10-12 DIAGNOSIS — Z888 Allergy status to other drugs, medicaments and biological substances status: Secondary | ICD-10-CM | POA: Diagnosis not present

## 2021-10-13 ENCOUNTER — Encounter: Payer: Self-pay | Admitting: Adult Health

## 2021-10-13 ENCOUNTER — Ambulatory Visit (INDEPENDENT_AMBULATORY_CARE_PROVIDER_SITE_OTHER): Payer: Medicare Other | Admitting: Adult Health

## 2021-10-13 VITALS — BP 136/69 | HR 73

## 2021-10-13 DIAGNOSIS — G8111 Spastic hemiplegia affecting right dominant side: Secondary | ICD-10-CM | POA: Diagnosis not present

## 2021-10-13 DIAGNOSIS — I610 Nontraumatic intracerebral hemorrhage in hemisphere, subcortical: Secondary | ICD-10-CM | POA: Diagnosis not present

## 2021-10-13 DIAGNOSIS — I69398 Other sequelae of cerebral infarction: Secondary | ICD-10-CM | POA: Diagnosis not present

## 2021-10-13 DIAGNOSIS — R269 Unspecified abnormalities of gait and mobility: Secondary | ICD-10-CM
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
## 2021-10-13 NOTE — Patient Instructions (Signed)
Contact physical therapy in [place] for evaluation as previously discussed - [phone] ? ?Continue aspirin 81 mg daily  for secondary stroke prevention ? ?Continue to follow up with PCP regarding cholesterol and blood pressure management  ?Maintain strict control of hypertension with blood pressure goal below 130/90 and cholesterol with LDL cholesterol (bad cholesterol) goal below 70 mg/dL.  ? ?Signs of a Stroke? Follow the BEFAST method:  ?Balance Watch for a sudden loss of balance, trouble with coordination or vertigo ?Eyes Is there a sudden loss of vision in one or both eyes? Or double vision?  ?Face: Ask the person to smile. Does one side of the face droop or is it numb?  ?Arms: Ask the person to raise both arms. Does one arm drift downward? Is there weakness or numbness of a leg? ?Speech: Ask the person to repeat a simple phrase. Does the speech sound slurred/strange? Is the person confused ? ?Time: If you observe any of these signs, call 911. ? ? ? ? ? ? ?Thank you for coming to see [place] at [facility]. I hope we have been able to provide you high quality care today. ? ?You may receive a patient satisfaction survey over the next few weeks. We would appreciate your feedback and comments so that we may continue to improve ourselves and the health of our patients. ? ?

## 2021-10-17 ENCOUNTER — Other Ambulatory Visit: Payer: Self-pay | Admitting: Family Medicine

## 2021-10-19 ENCOUNTER — Other Ambulatory Visit: Payer: Self-pay | Admitting: Family Medicine

## 2021-10-20 ENCOUNTER — Telehealth: Payer: Self-pay | Admitting: Adult Health

## 2021-10-20 DIAGNOSIS — I69398 Other sequelae of cerebral infarction: Secondary | ICD-10-CM

## 2021-10-20 DIAGNOSIS — I610 Nontraumatic intracerebral hemorrhage in hemisphere, subcortical: Secondary | ICD-10-CM

## 2021-10-20 DIAGNOSIS — Z8673 Personal history of transient ischemic attack (TIA), and cerebral infarction without residual deficits: Secondary | ICD-10-CM

## 2021-10-20 DIAGNOSIS — G8111 Spastic hemiplegia affecting right dominant side: Secondary | ICD-10-CM

## 2021-10-20 NOTE — Telephone Encounter (Signed)
Are you will to place referral? I see last OV notes she discussed PT with you but looks like they didn't make contact for scheduling.  ?

## 2021-10-20 NOTE — Telephone Encounter (Signed)
Please place referral for PT as requested - can see prior order for indication. Thank you.  ?

## 2021-10-20 NOTE — Telephone Encounter (Signed)
PT referral placed as per PT referral in Oct 2022. ?

## 2021-10-20 NOTE — Telephone Encounter (Signed)
Pt said, need a referral for to go to physical therapy in [place] for evaluation. Would like a call from the nurse. ?

## 2021-10-25 DIAGNOSIS — R55 Syncope and collapse: Secondary | ICD-10-CM

## 2021-10-25 HISTORY — DX: Syncope and collapse: R55

## 2021-10-26 ENCOUNTER — Ambulatory Visit: Payer: Medicare Other | Admitting: Physical Therapy

## 2021-11-02 ENCOUNTER — Other Ambulatory Visit: Payer: Self-pay

## 2021-11-02 ENCOUNTER — Encounter (HOSPITAL_COMMUNITY): Payer: Self-pay | Admitting: *Deleted

## 2021-11-02 ENCOUNTER — Emergency Department (HOSPITAL_COMMUNITY): Payer: Medicare Other

## 2021-11-02 ENCOUNTER — Emergency Department (HOSPITAL_COMMUNITY)
Admission: EM | Admit: 2021-11-02 | Discharge: 2021-11-02 | Disposition: A | Discharge status: Home / Self Care | Payer: Medicare Other | Attending: Emergency Medicine | Admitting: Emergency Medicine

## 2021-11-02 ENCOUNTER — Ambulatory Visit: Payer: Medicare Other | Attending: Adult Health

## 2021-11-02 DIAGNOSIS — I69398 Other sequelae of cerebral infarction: Secondary | ICD-10-CM | POA: Insufficient documentation

## 2021-11-02 DIAGNOSIS — I610 Nontraumatic intracerebral hemorrhage in hemisphere, subcortical: Secondary | ICD-10-CM | POA: Diagnosis not present

## 2021-11-02 DIAGNOSIS — Z8673 Personal history of transient ischemic attack (TIA), and cerebral infarction without residual deficits: Secondary | ICD-10-CM | POA: Diagnosis not present

## 2021-11-02 DIAGNOSIS — R112 Nausea with vomiting, unspecified: Secondary | ICD-10-CM | POA: Insufficient documentation

## 2021-11-02 DIAGNOSIS — R52 Pain, unspecified: Secondary | ICD-10-CM | POA: Diagnosis not present

## 2021-11-02 DIAGNOSIS — R519 Headache, unspecified: Secondary | ICD-10-CM | POA: Diagnosis not present

## 2021-11-02 DIAGNOSIS — R110 Nausea: Secondary | ICD-10-CM | POA: Diagnosis not present

## 2021-11-02 DIAGNOSIS — R531 Weakness: Secondary | ICD-10-CM | POA: Diagnosis not present

## 2021-11-02 DIAGNOSIS — R269 Unspecified abnormalities of gait and mobility: Secondary | ICD-10-CM | POA: Insufficient documentation

## 2021-11-02 DIAGNOSIS — I10 Essential (primary) hypertension: Secondary | ICD-10-CM | POA: Diagnosis not present

## 2021-11-02 DIAGNOSIS — M6281 Muscle weakness (generalized): Secondary | ICD-10-CM | POA: Insufficient documentation

## 2021-11-02 DIAGNOSIS — G8111 Spastic hemiplegia affecting right dominant side: Secondary | ICD-10-CM | POA: Insufficient documentation

## 2021-11-02 DIAGNOSIS — R55 Syncope and collapse: Secondary | ICD-10-CM | POA: Diagnosis not present

## 2021-11-02 DIAGNOSIS — R1111 Vomiting without nausea: Secondary | ICD-10-CM | POA: Diagnosis not present

## 2021-11-02 LAB — COMPREHENSIVE METABOLIC PANEL
ALT: 19 U/L (ref 0–44)
AST: 25 U/L (ref 15–41)
Albumin: 3.7 g/dL (ref 3.5–5.0)
Alkaline Phosphatase: 66 U/L (ref 38–126)
Anion gap: 7 (ref 5–15)
BUN: 16 mg/dL (ref 8–23)
CO2: 25 mmol/L (ref 22–32)
Calcium: 8.8 mg/dL — ABNORMAL LOW (ref 8.9–10.3)
Chloride: 99 mmol/L (ref 98–111)
Creatinine, Ser: 0.72 mg/dL (ref 0.44–1.00)
GFR, Estimated: >60 mL/min (ref >60)
Glucose, Bld: 125 mg/dL — ABNORMAL HIGH (ref 70–99)
Potassium: 3.5 mmol/L (ref 3.5–5.1)
Sodium: 131 mmol/L — ABNORMAL LOW (ref 135–145)
Total Bilirubin: 0.5 mg/dL (ref 0.3–1.2)
Total Protein: 6.4 g/dL — ABNORMAL LOW (ref 6.5–8.1)

## 2021-11-02 LAB — CBC WITH DIFFERENTIAL/PLATELET
Abs Immature Granulocytes: 0.02 10*3/uL (ref 0.00–0.07)
Basophils Absolute: 0 10*3/uL (ref 0.0–0.1)
Basophils Relative: 0 %
Eosinophils Absolute: 0.1 10*3/uL (ref 0.0–0.5)
Eosinophils Relative: 1 %
HCT: 35.8 % — ABNORMAL LOW (ref 36.0–46.0)
Hemoglobin: 12.5 g/dL (ref 12.0–15.0)
Immature Granulocytes: 0 %
Lymphocytes Relative: 23 %
Lymphs Abs: 1.7 10*3/uL (ref 0.7–4.0)
MCH: 31.4 pg (ref 26.0–34.0)
MCHC: 34.9 g/dL (ref 30.0–36.0)
MCV: 89.9 fL (ref 80.0–100.0)
Monocytes Absolute: 0.6 10*3/uL (ref 0.1–1.0)
Monocytes Relative: 8 %
Neutro Abs: 5.2 10*3/uL (ref 1.7–7.7)
Neutrophils Relative %: 68 %
Platelets: 183 10*3/uL (ref 150–400)
RBC: 3.98 MIL/uL (ref 3.87–5.11)
RDW: 12.4 % (ref 11.5–15.5)
WBC: 7.6 10*3/uL (ref 4.0–10.5)
nRBC: 0 % (ref 0.0–0.2)

## 2021-11-02 LAB — URINALYSIS, ROUTINE W REFLEX MICROSCOPIC
Bilirubin Urine: NEGATIVE
Glucose, UA: NEGATIVE mg/dL
Hgb urine dipstick: NEGATIVE
Ketones, ur: NEGATIVE mg/dL
Leukocytes,Ua: NEGATIVE
Nitrite: NEGATIVE
Protein, ur: NEGATIVE mg/dL
Specific Gravity, Urine: 1.005 (ref 1.005–1.030)
pH: 7 (ref 5.0–8.0)

## 2021-11-02 LAB — TROPONIN I (HIGH SENSITIVITY)
Troponin I (High Sensitivity): 2 ng/L (ref <18)
Troponin I (High Sensitivity): 3 ng/L (ref <18)

## 2021-11-02 LAB — LIPASE, BLOOD: Lipase: 30 U/L (ref 11–51)

## 2021-11-02 MED ORDER — SODIUM CHLORIDE 0.9 % IV BOLUS
500.0000 mL | Freq: Once | INTRAVENOUS | Status: AC
  Administered 2021-11-02: 500 mL via INTRAVENOUS

## 2021-11-02 NOTE — ED Notes (Signed)
Pt removed from bedpan and washed up.  ?

## 2021-11-02 NOTE — Discharge Instructions (Signed)
You were seen in the emergency room today after passing out at home.  I have placed a referral to see cardiology for follow-up since this happened to before.  If he develop any chest pain or additional episodes of passing out you should return to the emergency department for reevaluation.  ?

## 2021-11-02 NOTE — ED Triage Notes (Signed)
Pt 's husband called EMS, pt with LOC while at table laughing , +N/V x  1. Hx of same.  Bp 120/60, HR 69, O2 97 % cbg 112, R-14 ? ?Pt with hx of stroke with rt sided deficient  ? ? Pt received zofran 8 mg IV en route. ?

## 2021-11-02 NOTE — ED Notes (Signed)
Pt placed on bedpan

## 2021-11-02 NOTE — ED Provider Notes (Signed)
? ?Emergency Department Provider Note ? ? ?I have reviewed the triage vital signs and the nursing notes. ? ? ?HISTORY ? ?Chief Complaint ?Loss of Consciousness ? ? ?HPI ?Alison Cuevas is a 76 y.o. female with past medical history reviewed below including prior stroke with right-sided deficits presents emergency department with syncope episode at home.  Patient was sitting around in her wheelchair, eating lunch, talking with family when she suddenly began to feel nauseated.  She states that she felt very lightheaded and family states she then lost consciousness.  Her eyes stayed open.  They did not witness any seizure activity.  Patient denies feeling any chest pain/pressure/palpitations.  No shortness of breath.  No sudden severe headache.  She was unresponsive for around 40 seconds.  EMS arrived and gave Zofran with the patient having resulting nausea and vomiting.  She reports that in the past she had had syncope with GI upset and being on the toilet.  ? ? ?Past Medical History:  ?Diagnosis Date  ? Allergic rhinitis   ? maple pollen  ? Anxiety   ? Arthritis   ? Bowel obstruction ([place])   ? Chronic low back pain   ? Dr. Trenton Gammon.  Has had back injection--bp elevated after.  ? Chronic pain syndrome   ? Chronic renal insufficiency, stage 2 (mild)   ? GFR 60-70  ? DDD (degenerative disc disease), cervical   ? Hx of ACDF (Dr. [name]).  Followed by Dr. [name].  Also, Dr. [name] do left C7-T1 intralaminal epidural injection.  ? Diverticulosis of colon   ? DJD (degenerative joint disease)   ? Gallstones   ? GERD (gastroesophageal reflux disease)   ? Herpes zoster 07/08/2014  ? 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 L frontoparietal hemorrhagic CVA, R MCA/PCA watershed ischemic CVA.  Resid R arm and leg weakness + flexion contractures.  ? Hypercholesteremia   ? mild; pt declined statin trial 05/2014--needs recheck lipid panel at first f/u visit in 2017  ? Hypertension   ? Osteopenia 06/2017  ? T  score -1.4 FRAX 15% / 2%  ? Ovarian cyst 2017  ? 82019-robotic assisted bilatel SPO: all path benign.  ? Perianal dermatitis   ? prn cutivate  ? Phlebitis   ? 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 R arm and leg (hemorrhagic L cerebral hemisphere CVA)  ? Right knee injury 2018  ? Patellofemoral crush injury--Dr. [name].  ? Solitary thyroid nodule 05/2021  ? needs bx->plan endo ref  ? Stroke [facility])   ? Trochanteric bursitis of right hip   ? Recurrent (injection by Dr. [name] 05/26/15)  ? Tympanic membrane rupture, right 12/01/2016  ? As of 08/2018 pt set for tympanomastoidectomy and STSG (Dr. [name]). Recurrent fungal/bact OE.  ? Varicose vein   ? left leg   ? ? ?Review of Systems ? ?Constitutional: No fever/chills ?Eyes: No visual changes. ?ENT: No sore throat. ?Cardiovascular: Denies chest pain. Positive syncope.  ?Respiratory: Denies shortness of breath. ?Gastrointestinal: No abdominal pain. Positive nausea and vomiting.  No diarrhea.  No constipation. ?Genitourinary: Negative for dysuria. ?Musculoskeletal: Negative for back pain. ?Skin: Negative for rash. ?Neurological: Negative for headaches, focal weakness or numbness. ? ? ?____________________________________________ ? ? ?PHYSICAL EXAM: ? ?VITAL SIGNS: ?ED Triage Vitals  ?Enc Vitals Group  ?   BP 11/02/21 1648 (!) 131/56  ?   Pulse Rate 11/02/21 1648 72  ?   Resp 11/02/21 1648 15  ?   Temp 11/02/21 1648 (!) 97.5 ?F (  36.4 ?C)  ?   Temp Source 11/02/21 1648 Oral  ?   SpO2 11/02/21 1643 97 %  ?   Weight 11/02/21 1645 116 lb (52.6 kg)  ?   Height 11/02/21 1645 5' (1.524 m)  ? ?Constitutional: Alert and oriented. Well appearing and in no acute distress. ?Eyes: Conjunctivae are normal.  ?Nose: No congestion/rhinnorhea. ?Mouth/Throat: Mucous membranes are moist.   ?Neck: No stridor.   ?Cardiovascular: Normal rate, regular rhythm. Good peripheral circulation. Grossly normal heart sounds.   ?Respiratory: Normal respiratory effort.  No retractions. Lungs  CTAB. ?Gastrointestinal: Soft and nontender. No distention.  ?Musculoskeletal: No lower extremity tenderness nor edema. No gross deformities of extremities. ?Neurologic:  Normal speech and language. Baseline right sided weakness from prior CVA.  ?Skin:  Skin is warm, dry and intact. No rash noted. ? ?____________________________________________ ?  ?LABS ?(all labs ordered are listed, but only abnormal results are displayed) ? ?Labs Reviewed  ?COMPREHENSIVE METABOLIC PANEL - Abnormal; Notable for the following components:  ?    Result Value  ? Sodium 131 (*)   ? Glucose, Bld 125 (*)   ? Calcium 8.8 (*)   ? Total Protein 6.4 (*)   ? All other components within normal limits  ?CBC WITH DIFFERENTIAL/PLATELET - Abnormal; Notable for the following components:  ? HCT 35.8 (*)   ? All other components within normal limits  ?URINALYSIS, ROUTINE W REFLEX MICROSCOPIC - Abnormal; Notable for the following components:  ? Color, Urine STRAW (*)   ? All other components within normal limits  ?LIPASE, BLOOD  ?TROPONIN I (HIGH SENSITIVITY)  ?TROPONIN I (HIGH SENSITIVITY)  ? ?____________________________________________ ? ?EKG ? ? EKG Interpretation ? ?Date/Time:  Tuesday Nov 02 2021 16:54:58 EDT ?Ventricular Rate:  69 ?PR Interval:  198 ?QRS Duration: 155 ?QT Interval:  472 ?QTC Calculation: 506 ?R Axis:   -18 ?Text Interpretation: Sinus rhythm Left bundle branch block Confirmed by Nanda Quinton [phone]) on 11/02/2021 5:22:00 PM ?  ? ?  ? ? ?____________________________________________ ? ?RADIOLOGY ? ?DG Chest 1 View ? ?Result Date: 11/02/2021 ?CLINICAL DATA:  Syncopal episode prior to arrival. EXAM: CHEST  1 VIEW COMPARISON:  09/04/2021 FINDINGS: The cardiomediastinal contours are normal. Atherosclerosis of the thoracic aorta. Pulmonary vasculature is normal. No consolidation, pleural effusion, or pneumothorax. No acute osseous abnormalities are seen. Surgical hardware in the lower cervical spine is partially included. IMPRESSION: 1. No  acute chest findings. 2. Aortic atherosclerosis. Electronically Signed   By: Keith Rake M.D.   On: 11/02/2021 17:41   ? ?____________________________________________ ? ? ?PROCEDURES ? ?Procedure(s) performed:  ? ?Procedures ? ?None  ?____________________________________________ ? ? ?INITIAL IMPRESSION / ASSESSMENT AND PLAN / ED COURSE ? ?Pertinent labs & imaging results that were available during my care of the patient were reviewed by me and considered in my medical decision making (see chart for details). ?  ?This patient is Presenting for Evaluation of syncope, which does require a range of treatment options, and is a complaint that involves a high risk of morbidity and mortality. ? ?The Differential Diagnoses includes dehydration, vasovagal syncope, arrhythmia, ACS, PE.  ? ?Critical Interventions-  ?  ?Medications  ?sodium chloride 0.9 % bolus 500 mL (0 mLs Intravenous Stopped 11/02/21 2011)  ? ? ?Reassessment after intervention:  Patient is feeling much better after IVF. No abdominal tenderness or nausea/vomiting.  ? ?I did obtain Additional Historical Information from family at bedside.  ? ?I decided to review pertinent External Data, and in summary prior history  of CVA with right sided deficits. ?  ?Clinical Laboratory Tests Ordered, included troponin negative x2.  No leukocytosis or anemia.  No acute kidney injury. ? ?Radiologic Tests Ordered, included CXR. I independently interpreted the images and agree with radiology interpretation.  ? ?Cardiac Monitor Tracing which shows NSR. No ectopy.  ? ? ?Social Determinants of Health Risk with no prior smoking history.  ? ?Medical Decision Making: Summary:  ?Patient presents emergency department for evaluation of syncope.  Seems vasovagal with nausea as precursor along with vomiting after she awoke.  No headache or new neurodeficits.  Will monitor here on telemetry and reassess.  ? ?Reevaluation with update and discussion with patient and family at bedside.  She  continues to feel very well.  Vital signs have remained within normal limits.  Suspect vasovagal syncope clinically.  Plan for cardiology referral given the prior episodes of this.  Patient comfortable with plan for [facility]

## 2021-11-02 NOTE — Therapy (Addendum)
[facility] ?[address] ?[place], [place], [address] ?Phone: [phone]   Fax:  [phone] ? ?Physical Therapy Evaluation ? ?Patient Details  ?Name: Alison Cuevas ?MRN: [id number] ?Date of Birth: [birth year] ?Referring Provider (PT): Darden Dates, NP ? ? ?Encounter Date: 11/02/2021 ? ? PT End of Session - 11/02/21 1119   ? ? Visit Number 1   ? Number of Visits 1   ? PT Start Time 1120   ? PT Stop Time 1240   ? PT Time Calculation (min) 80 min   ? Activity Tolerance Patient tolerated treatment well   ? Behavior During Therapy [facility] for tasks assessed/performed   ? ?  ?  ? ?  ? ? ?Past Medical History:  ?Diagnosis Date  ? Allergic rhinitis   ? maple pollen  ? Anxiety   ? Arthritis   ? Bowel obstruction ([place])   ? Chronic low back pain   ? Dr. Trenton Gammon.  Has had back injection--bp elevated after.  ? Chronic pain syndrome   ? Chronic renal insufficiency, stage 2 (mild)   ? GFR 60-70  ? DDD (degenerative disc disease), cervical   ? Hx of ACDF (Dr. [name]).  Followed by Dr. [name].  Also, Dr. [name] do left C7-T1 intralaminal epidural injection.  ? Diverticulosis of colon   ? DJD (degenerative joint disease)   ? Gallstones   ? GERD (gastroesophageal reflux disease)   ? Herpes zoster 07/08/2014  ? 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 L frontoparietal hemorrhagic CVA, R MCA/PCA watershed ischemic CVA.  Resid R arm and leg weakness + flexion contractures.  ? Hypercholesteremia   ? mild; pt declined statin trial 05/2014--needs recheck lipid panel at first f/u visit in 2017  ? Hypertension   ? Osteopenia 06/2017  ? T score -1.4 FRAX 15% / 2%  ? Ovarian cyst 2017  ? 82019-robotic assisted bilatel SPO: all path benign.  ? Perianal dermatitis   ? prn cutivate  ? Phlebitis   ? 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 R arm and leg (hemorrhagic L cerebral hemisphere CVA)  ? Right knee injury 2018  ? Patellofemoral crush injury--Dr. [name].  ? Solitary thyroid nodule 05/2021  ?  needs bx->plan endo ref  ? Stroke [facility])   ? Trochanteric bursitis of right hip   ? Recurrent (injection by Dr. [name] 05/26/15)  ? Tympanic membrane rupture, right 12/01/2016  ? As of 08/2018 pt set for tympanomastoidectomy and STSG (Dr. [name]). Recurrent fungal/bact OE.  ? Varicose vein   ? left leg   ? ? ?Past Surgical History:  ?Procedure Laterality Date  ? ABDOMINAL HYSTERECTOMY  03/2015  ? TAH.  Last pap 2016.  No hx of abnl paps.  Per GYN, no further pap smears indicated.  ? ANTERIOR AND POSTERIOR REPAIR N/A 03/18/2014  ? Procedure: Cystocele repair with graft, Vault suspension, Rectocele repair;  Surgeon: [name], MD;  Location: [facility];  Service: Urology;  Laterality: N/A;  ? CHOLECYSTECTOMY    ? CHOLECYSTECTOMY, LAPAROSCOPIC    ? COLON SURGERY    ? COLONOSCOPY  04/2006; 05/26/16  ? 2007 (Dr. [name]): Normal.  04/2016 (Dr. [name]) normal except diverticulosis and decreased anal sphincter tone.  No repeat colonoscopy is recommended due to age.  ? cspine surgery    ? Dr. Wiliam Ke level ant cerv discectomy /fusion w/plating  ? CYSTO N/A 03/18/2014  ? Procedure: CYSTO;  Surgeon: [name], MD;  Location: [facility];  Service: Urology;  Laterality: N/A;  ? DEXA  07/30/2015; 06/2018  ? 2017 and 2020 -->Osteopenia--repeat 2 yrs. T score -2.29 Mar 2021. Rpt 2 yrs.  ? LYSIS OF ADHESION N/A 01/31/2018  ? Procedure: POSSIBLE LYSIS OF ADHESION;  Surgeon: Everitt Amber, MD;  Location: [facility];  Service: Gynecology;  Laterality: N/A;  ? RECTOCELE REPAIR    ? with prolasped bledder repair  ? RESECTION OF COLON    ? BENIGN TUMOR  ? RIGHT EAR SURGERY  08/23/2017  ? Dr. 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 right ear surgery  12/12/2018  ? Right tympanomastoidectomy, ossiculoplasty with partial prosthesis, split thickness skin graft from postauricular skin 1x1cm, and  right tragal cartilage graft 1x1 cm [facility])  ? right hemicolectomy for diverticulitis with abscess  1993  ? ROBOTIC ASSISTED BILATERAL SALPINGO OOPHERECTOMY Bilateral 01/31/2018  ? All PATH benign.  Procedure: XI ROBOTIC ASSISTED BILATERAL SALPINGO OOPHORECTOMY;  Surgeon: Everitt Amber, MD;  Location: [facility];  Service: Gynecology;  Laterality: Bilateral;  ? TRANSTHORACIC ECHOCARDIOGRAM  01/27/2020  ? EF 65-70%, NORMAL.  ? TUBAL LIGATION    ? ? ?There were no vitals filed for this visit. ? ? ? Subjective Assessment - 11/02/21 1119   ? ? Subjective Patient reports that she had a stroke on 2020. She feels that she has made good progress with therapy for a while, but then she felt like she "hit a brick wall." Her caregiver feels that her AFO is causing her right foot to turn in. She has been doing her therapy since she had her stroke. She notes that she always has to use her walker and AFO to walk around. Her caregiver notes that her toes have begun to curl up and she has tried to stretch it, but this has been too painful in recent weeks.   ? Patient is accompained by: Family member   daughter and caregiver  ? Patient Stated Goals get out of the house and walk around her yard   ? Currently in Pain? Yes   ? Pain Score 8    ? Pain Location --   arm and leg  ? Pain Orientation Right   ? Pain Descriptors / Indicators Other (Comment)   needles  ? Pain Type Chronic pain   ? Pain Onset More than a month ago   ? Pain Frequency Constant   ? Aggravating Factors  botox shot in her right arm   ? Pain Relieving Factors medication   ? ?  ?  ? ?  ? ? ? ? ? [facility] PT Assessment - 11/02/21 0001   ? ?  ? Assessment  ? 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 Referring Provider (PT) Darden Dates, NP   ? Onset Date/Surgical Date --   2020  ? Hand Dominance Left   ? Next MD Visit none scheduled   ? Prior Therapy Yes   ?  ? Precautions  ? Precautions Fall   ?  ? Restrictions  ? Weight Bearing Restrictions No   ?   ? Balance Screen  ? Has the patient fallen in the past 6 months No   ? Has the patient had a decrease in activity level because of a fear of falling?  No   ? Is the patient reluctant to leave their home because of a fear of falling?  No   ?  ? Prior Function  ? Level of Independence Needs assistance with homemaking   ?  ? Cognition  ? Overall Cognitive Status Within Functional Limits for tasks assessed   ?  ? ROM / Strength  ? AROM / PROM / Strength Strength   ?  ? Strength  ? Strength Assessment Site Hip;Knee;Ankle   ? Right/Left Hip Right;Left   ? Right Hip ADduction 3/5   ? Left Hip ADduction 4-/5   ? Right/Left Ankle Right   ? Right Ankle Dorsiflexion 0/5   ?  ? Flexibility  ? Soft Tissue Assessment /Muscle Length yes   increased gastroc/soleus tone and tightness  ?  ? Transfers  ? Transfers Sit to Stand;Stand to Sit   ? Sit to Stand 6: Modified independent (Device/Increase time);With upper extremity assist;With armrests   ? Stand to Sit 6: Modified independent (Device/Increase time);With upper extremity assist;With armrests   ?  ? Ambulation/Gait  ? Ambulation/Gait Yes   ? Ambulation/Gait Assistance 6: Modified independent (Device/Increase time)   ? Assistive device Rolling walker   ? Gait Pattern Step-to pattern;Decreased stride length;Narrow base of support   right foot inversion,  ? Ambulation Surface Level;Indoor   ? Gait velocity decreased   ? ?  ?  ? ?  ? ? ? ? ? ? ? ? ? ? ? ? ? ?Objective measurements completed on examination: See above findings.  ? ? ? ? ? [facility] Adult PT Treatment/Exercise - 11/02/21 0001   ? ?  ? Exercises  ? Exercises Knee/Hip   ?  ? Knee/Hip Exercises: Standing  ? Other Standing Knee Exercises Sidestepping   with walker; 15 reps each  ?  ? Knee/Hip Exercises: Seated  ? Other Seated Knee/Hip Exercises Isometric hip ABD and ADD   10 reps each; 5 second hold  ? ?  ?  ? ?  ? ? ? ? ? ? ? ? ? ? PT Education - 11/02/21 1249   ? ? Education Details healing timeframe following a CVA, HEP  assessment and modification, types and styles of AFO braces   ? Person(s) Educated Patient;Child(ren);Caregiver(s)   ? Methods Explanation   ? Comprehension Verbalized understanding   ? ?  ?  ? ?  ? ? ? ? ?

## 2021-11-10 ENCOUNTER — Encounter: Payer: Medicare Other | Attending: Physical Medicine & Rehabilitation | Admitting: Physical Medicine & Rehabilitation

## 2021-11-10 ENCOUNTER — Encounter: Payer: Self-pay | Admitting: Physical Medicine & Rehabilitation

## 2021-11-10 ENCOUNTER — Ambulatory Visit: Payer: Medicare Other | Admitting: Family Medicine

## 2021-11-10 VITALS — BP 160/74 | HR 87

## 2021-11-10 DIAGNOSIS — M62838 Other muscle spasm: Secondary | ICD-10-CM

## 2021-11-10 DIAGNOSIS — M792 Neuralgia and neuritis, unspecified: Secondary | ICD-10-CM | POA: Diagnosis not present

## 2021-11-10 DIAGNOSIS — G8111 Spastic hemiplegia affecting right dominant side: Secondary | ICD-10-CM

## 2021-11-10 MED ORDER — CARBAMAZEPINE ER 100 MG PO TB12: 100.0000 mg | ORAL_TABLET | Freq: Two times a day (BID) | ORAL | 2 refills | Status: DC

## 2021-11-10 NOTE — Progress Notes (Signed)
Phenol neurolysis of the right tibial nerve ? ?Indication: Severe spasticity in the plantar flexor muscles which is not responding to medical management and other conservative care and interfering with functional use. ? ?Informed consent was obtained after describing the risks and benefits of the procedure with the patient this includes bleeding bruising and infection as well as medication side effects. The patient elected to proceed and has given written consent. Patient placed in a prone position on the exam table. External DC stimulation was applied to the popliteal space using a nerve stimulator. Plantar flexion twitch was obtained. The popliteal region was prepped with Betadine and then entered with a 22-gauge 50 mm needle electrode under electrical stimulation guidance. Plantar flexion which was obtained and confirmed. Then 4 cc of 5% phenol were injected. The patient tolerated procedure well. Post procedure instructions and followup visit were given.  ? ? ?Still having nerve pain right arm /leg. Will begin trial of tegretol '100mg'$  qhs to start, bid if needed and tolerated ?

## 2021-11-10 NOTE — Patient Instructions (Addendum)
PLEASE FEEL FREE TO CALL OUR OFFICE WITH ANY PROBLEMS OR QUESTIONS ([phone]) ? ?TEGRETOL: ?TAKE 1 TAB AT BEDTIME FOR A WEEK. IF IT HELPS, JUST STICK WITH THAT DOSE. IF YOU ARE TOLERATING IT BUT YOUR PAIN ISN'T BETTER, YOU CAN INCREASE TO TWICE DAILY.  ?                ?

## 2021-11-11 ENCOUNTER — Ambulatory Visit (INDEPENDENT_AMBULATORY_CARE_PROVIDER_SITE_OTHER): Payer: Medicare Other | Admitting: Family Medicine

## 2021-11-11 ENCOUNTER — Encounter: Payer: Self-pay | Admitting: Family Medicine

## 2021-11-11 VITALS — BP 117/66 | HR 76 | Temp 97.9°F | Ht 60.0 in | Wt 117.2 lb

## 2021-11-11 DIAGNOSIS — G894 Chronic pain syndrome: Secondary | ICD-10-CM

## 2021-11-11 DIAGNOSIS — I10 Essential (primary) hypertension: Secondary | ICD-10-CM | POA: Diagnosis not present

## 2021-11-11 DIAGNOSIS — I6930 Unspecified sequelae of cerebral infarction: Secondary | ICD-10-CM | POA: Diagnosis not present

## 2021-11-11 DIAGNOSIS — Z79899 Other long term (current) drug therapy: Secondary | ICD-10-CM

## 2021-11-11 MED ORDER — POTASSIUM CHLORIDE CRYS ER 20 MEQ PO TBCR: 20.0000 meq | EXTENDED_RELEASE_TABLET | Freq: Every day | ORAL | 1 refills | Status: DC

## 2021-11-11 MED ORDER — LISINOPRIL 5 MG PO TABS: 5.0000 mg | ORAL_TABLET | Freq: Every day | ORAL | 1 refills | Status: DC

## 2021-11-11 MED ORDER — PANTOPRAZOLE SODIUM 40 MG PO TBEC: DELAYED_RELEASE_TABLET | ORAL | 1 refills | Status: DC

## 2021-11-11 NOTE — Progress Notes (Signed)
OFFICE VISIT  11/11/2021  CC:  Chief Complaint  Patient presents with   Hypertension   Pain    Chronic   HPI:    Patient is a 76 y.o. female who presents accompanied by her friend Darlene for 34-monthfollow-up chronic pain syndrome,HTN, hx of CVA with residual deficit. A/P as of last visit: "#1 hypertension, well controlled on lisinopril 5 mg a day.  2. 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  She continues to ambulate with her walker well. She has AFO, receives botox injections for spasticity.  Continue to use Zanaflex bid as needed. Patient will follow up with Dr. SEphriam Knuckles--phenol inj R tibial nerve being arranged by him.   #3 chronic right leg pain.  Combination of spasticity pain plus neuropathic pain. She limits use of pain medications mostly due to fear of sedation, constipation, does not like medications, etc. Encouraged her to use the Vicodin 1/2-1 tab every 6 hours as needed.  She is careful to avoid using Zanaflex at the same time as her hydrocodone. Anti-inflammatories contraindicated in the setting of history of hemorrhagic stroke".  INTERIM HX: She is doing about the same, living with chronic pain. Underwent phenol neurolysis of R tibial nerve yesterday by Dr. SNaaman Plummer  Also started on tegretol '100mg'$  qhs but has not started this med yet.  Home blood pressures 1127Nsystolic consistently.  Takes her Vicodin anywhere from 1-3 times a day.  She has always tended to underdosed this. Takes Tylenol plain at times, also a muscle relaxer as needed. PMP AWARE reviewed today: most recent rx for vicodin was filled 08/27/21, # 222 rx by me. Post recent prescription refill for alprazolam was 10/17/2021, #90, prescription by me. No red flags.  ROS as above, plus--> no fevers, no CP, no SOB, no wheezing, no cough, no dizziness, no HAs, no rashes, no melena/hematochezia.  No polyuria or polydipsia.  No myalgias or arthralgias.  No focal weakness, paresthesias, or  tremors.  No acute vision or hearing abnormalities.  No dysuria or unusual/new urinary urgency or frequency.  No recent changes in lower legs. No n/v/d or abd pain.  No palpitations.     Past Medical History:  Diagnosis Date   Allergic rhinitis    maple pollen   Anxiety    Arthritis    Bowel obstruction (HCC)    Chronic low back pain    Dr. PTrenton Gammon  Has had back injection--bp elevated after.   Chronic pain syndrome    Chronic renal insufficiency, stage 2 (mild)    GFR 60-70   DDD (degenerative disc disease), cervical    Hx of ACDF (Dr. PAnnette Stable.  Followed by Dr. VLynann Bologna  Also, Dr. INamon Cirrido left C7-T1 intralaminal epidural injection.   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  Resid R arm and leg weakness + flexion contractures.   Hypercholesteremia    mild; pt declined statin trial 05/2014--needs recheck lipid panel at first f/u visit in 2017   Hypertension    Osteopenia 06/2017   T score -1.4 FRAX 15% / 2%   Ovarian cyst 2017   82019-robotic assisted bilatel SPO: all path benign.   Perianal dermatitis    prn cutivate   Phlebitis    [place]  01[place]   R arm and leg (hemorrhagic L cerebral hemisphere CVA)   Right knee injury 2018   Patellofemoral crush injury--Dr. [name].   Solitary thyroid nodule 05/2021   needs bx->plan endo ref   Stroke [facility])    Trochanteric bursitis of right hip    Recurrent (injection by Dr. 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 [name]). Recurrent fungal/bact OE.   Varicose vein    left leg     Past Surgical History:  Procedure Laterality Date   ABDOMINAL HYSTERECTOMY  03/2015   TAH.  Last pap 2016.  No hx  of abnl paps.  Per GYN, no further pap smears indicated.   ANTERIOR AND POSTERIOR REPAIR N/A 03/18/2014   Procedure: Cystocele repair with graft, Vault suspension, Rectocele repair;  Surgeon: [name], MD;  Location: [facility];  Service: Urology;  Laterality: N/A;   CHOLECYSTECTOMY     CHOLECYSTECTOMY, LAPAROSCOPIC     COLON SURGERY     COLONOSCOPY  04/2006; 05/26/16   2007 (Dr. [name]): Normal.  04/2016 (Dr. [name]) normal except diverticulosis and decreased anal sphincter tone.  No repeat colonoscopy is recommended due to age.   cspine surgery     Dr. Wiliam Ke level ant cerv discectomy [name] w/plating   CYSTO N/A 03/18/2014   Procedure: CYSTO;  Surgeon: [name], MD;  Location: [facility];  Service: Urology;  Laterality: N/A;   DEXA  07/30/2015; 06/2018   2017 and 2020 -->Osteopenia--repeat 2 yrs. T score -2.29 Mar 2021. Rpt 2 yrs.   LYSIS OF ADHESION N/A 01/31/2018   Procedure: POSSIBLE LYSIS OF ADHESION;  Surgeon: Everitt Amber, MD;  Location: [facility];  Service: Gynecology;  Laterality: N/A;   [place]     with prolasped bledder repair   RESECTION OF COLON     BENIGN TUMOR   RIGHT EAR SURGERY  08/23/2017   Dr. 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  Procedure: XI ROBOTIC ASSISTED BILATERAL SALPINGO OOPHORECTOMY;  Surgeon: Everitt Amber, MD;  Location: [facility];  Service: Gynecology;  Laterality: Bilateral;   TRANSTHORACIC ECHOCARDIOGRAM  01/27/2020   EF 65-70%, NORMAL.   TUBAL  LIGATION      Outpatient Medications Prior to Visit  Medication Sig Dispense Refill   acetaminophen (TYLENOL) 500 MG tablet Take 1 tablet (500 mg total) by mouth 3 (three) times daily. 30 tablet 0   ALPRAZolam (XANAX) 0.5 MG tablet TAKE 1 TABLET BY MOUTH THREE TIMES A DAY AS NEEDED FOR ANXIETY 90 tablet 5   aspirin EC 81 MG tablet Take 1 tablet (81 mg total) by mouth daily. Swallow whole. 30 tablet 11   Biotin 5 MG TABS Take 5 mg by mouth daily.     fluticasone (FLONASE) 50 MCG/ACT nasal spray Place 2 sprays into both nostrils at bedtime. 48 g 3   HYDROcodone-acetaminophen (NORCO) 10-325 MG tablet Take 1 tablet by mouth every 6 (six) hours as needed. 20 tablet 0   ibuprofen (ADVIL) 200 MG  tablet Take 200 mg by mouth every 6 (six) hours as needed.     KLOR-CON M20 20 MEQ tablet TAKE 1 TABLET BY MOUTH EVERY DAY 30 tablet 0   lisinopril (ZESTRIL) 5 MG tablet TAKE 1 TABLET BY MOUTH EVERY DAY 30 tablet 0   loratadine (CLARITIN) 10 MG tablet Take 10 mg by mouth every other day.      meclizine (ANTIVERT) 12.5 MG tablet Take 1 tablet (12.5 mg total) by mouth 3 (three) times daily. 30 tablet 0   Multiple Minerals-Vitamins (CALCIUM-MAGNESIUM-ZINC-D3 PO) Take 1 tablet by mouth daily.      pantoprazole (PROTONIX) 40 MG tablet TAKE 1 TABLET BY MOUTH EVERYDAY AT BEDTIME 30 tablet 0   polyethylene glycol (MIRALAX / GLYCOLAX) 17 g packet Take 17 g by mouth daily as needed.     Sodium Fluoride (CLINPRO 5000) 1.1 % PSTE Clinpro 5000 1.1 % dental paste     tiZANidine (ZANAFLEX) 2 MG tablet TAKE 1 TABLET BY MOUTH 3 TIMES DAILY AS NEEDED FOR MUSCLE RELAXATION 270 tablet 0   verapamil (CALAN-SR) 180 MG CR tablet TAKE 1 TABLET BY MOUTH EVERY DAY 90 tablet 1   carbamazepine (TEGRETOL-XR) 100 MG 12 hr tablet Take 1 tablet (100 mg total) by mouth 2 (two) times daily. (Patient not taking: Reported on 11/11/2021) 60 tablet 2   nystatin ointment (MYCOSTATIN) Apply 1 application topically 2 (two) times daily. Apply to  affected area for up to 7 days. (Patient not taking: Reported on 11/11/2021) 30 g 0   triamcinolone ointment (KENALOG) 0.1 % triamcinolone acetonide 0.1 % topical ointment (Patient not taking: Reported on 11/11/2021)     No facility-administered medications prior to visit.    Allergies  Allergen Reactions   Gabapentin Anxiety    Elevated heart rate and crazy dreams   Prednisone Anxiety and Other (See Comments)    REACTION: funny feeling    ROS As per HPI  PE:    11/11/2021   12:56 PM 11/10/2021    3:30 PM 11/02/2021    8:00 PM  Vitals with BMI  Height '5\' 0"'$     Weight 117 lbs 3 oz    BMI 65.78    Systolic 469 629 528  Diastolic 66 74 70  Pulse 76 87 77     Physical Exam  Gen: Alert, well appearing.  Patient is oriented to person, place, time, and situation. AFFECT: pleasant, lucid thought and speech. CV: RRR, no m/r/g.   LUNGS: CTA bilat, nonlabored resps, good aeration in all lung fields. EXT: no clubbing or cyanosis.  no edema.    LABS:  Last lipids Lab Results  Component Value Date   CHOL 163 01/28/2020   HDL 75 01/28/2020   LDLCALC 81 01/28/2020   LDLDIRECT 136.9 09/20/2007   TRIG 33 01/28/2020   CHOLHDL 2.2 01/28/2020   Lab Results  Component Value Date   WBC 7.6 11/02/2021   HGB 12.5 11/02/2021   HCT 35.8 (L) 11/02/2021   MCV 89.9 11/02/2021   PLT 183 11/02/2021      Chemistry      Component Value Date/Time   NA 131 (L) 11/02/2021 1702   K 3.5 11/02/2021 1702   CL 99 11/02/2021 1702   CO2 25 11/02/2021 1702   BUN 16 11/02/2021 1702   CREATININE 0.72 11/02/2021 1702   CREATININE 0.67 11/06/2020 1640      Component Value Date/Time   CALCIUM 8.8 (L) 11/02/2021 1702   ALKPHOS 66 11/02/2021 1702  AST 25 11/02/2021 1702   ALT 19 11/02/2021 1702   BILITOT 0.5 11/02/2021 1702     Lab Results  Component Value Date   TSH 1.08 09/25/2019   Lab Results  Component Value Date   HGBA1C 5.0 01/28/2020   IMPRESSION AND PLAN:  #1 hypertension,  well controlled on lisinopril 5 mg daily and verapamil SR 180 mg daily. Cbc and cmet normal in the ED 11/02/21.  #2 chronic pain syndrome: Chronic right ear pain, chronic neuropathic pain and flexor contraction right lower leg.  Underwent phenol neurolysis of R tibial nerve yesterday by Dr. Naaman Plummer.  Also started on tegretol '100mg'$  qhs but has not started this med yet. Vicodin, 1 3 times daily as needed. New prescription was not needed today.  3.  History of hemorrhagic CVA with residual right lower leg spastic hemiplegia. Continue AFO.  Continue following with Dr. Tessa Lerner. Okay to continue Zanaflex and alprazolam--she has been on both chronically and without side effect.  Does not take these in the daytime.  #4 screening for hyperlipidemia. Patient requests lipid panel today.  An After Visit Summary was printed and given to the patient.  FOLLOW UP: 3 mo  Signed:  Crissie Sickles, MD           11/11/2021

## 2021-11-12 LAB — LIPID PANEL
Cholesterol: 168 mg/dL (ref 0–200)
HDL: 72.9 mg/dL (ref >39.00)
LDL Cholesterol: 87 mg/dL (ref 0–99)
NonHDL: 95.3
Total CHOL/HDL Ratio: 2
Triglycerides: 44 mg/dL (ref 0.0–149.0)
VLDL: 8.8 mg/dL (ref 0.0–40.0)

## 2021-11-22 ENCOUNTER — Encounter: Payer: Self-pay | Admitting: Family Medicine

## 2021-11-24 ENCOUNTER — Encounter: Payer: Self-pay | Admitting: Cardiology

## 2021-11-24 ENCOUNTER — Encounter: Payer: Self-pay | Admitting: *Deleted

## 2021-11-24 ENCOUNTER — Ambulatory Visit (INDEPENDENT_AMBULATORY_CARE_PROVIDER_SITE_OTHER): Payer: Medicare Other | Admitting: Cardiology

## 2021-11-24 VITALS — BP 122/58 | HR 82 | Ht 60.0 in | Wt 121.0 lb

## 2021-11-24 DIAGNOSIS — R55 Syncope and collapse: Secondary | ICD-10-CM | POA: Diagnosis not present

## 2021-11-24 DIAGNOSIS — I10 Essential (primary) hypertension: Secondary | ICD-10-CM | POA: Diagnosis not present

## 2021-11-24 DIAGNOSIS — I447 Left bundle-branch block, unspecified: Secondary | ICD-10-CM

## 2021-11-24 NOTE — Patient Instructions (Signed)
Medication Instructions:  Your physician recommends that you continue on your current medications as directed. Please refer to the Current Medication list given to you today.  *If you need a refill on your cardiac medications before your next appointment, please call your pharmacy*   Lab Work: NONE   If you have labs (blood work) drawn today and your tests are completely normal, you will receive your results only by: [place] (if you have MyChart) OR A paper copy in the mail If you have any lab test that is abnormal or we need to change your treatment, we will call you to review the results.   Testing/Procedures: Your physician has requested that you have a lexiscan myoview. For further information please visit [email or web address]. Please follow instruction sheet, as given.    Your physician has requested that you have an echocardiogram. Echocardiography is a painless test that uses sound waves to create images of your heart. It provides your doctor with information about the size and shape of your heart and how well your heart's chambers and valves are working. This procedure takes approximately one hour. There are no restrictions for this procedure.  Your physician has recommended that you wear an event monitor. Event monitors are medical devices that record the heart's electrical activity. Doctors most often [place] these monitors to diagnose arrhythmias. Arrhythmias are problems with the speed or rhythm of the heartbeat. The monitor is a small, portable device. You can wear one while you do your normal daily activities. This is usually used to diagnose what is causing palpitations/syncope (passing out).    Follow-Up: At [facility], you and your health needs are our priority.  As part of our continuing mission to provide you with exceptional heart care, we have created designated Provider Care Teams.  These Care Teams include your primary Cardiologist (physician) and Advanced  Practice Providers (APPs -  Physician Assistants and Nurse Practitioners) who all work together to provide you with the care you need, when you need it.  We recommend signing up for the patient portal called "MyChart".  Sign up information is provided on this After Visit Summary.  MyChart is used to connect with patients for Virtual Visits (Telemedicine).  Patients are able to view lab/test results, encounter notes, upcoming appointments, etc.  Non-urgent messages can be sent to your provider as well.   To learn more about what you can do with MyChart, go to [email or web address].    Your next appointment:   6 month(s)  The format for your next appointment:   In Person  Provider:   Fransico Him, MD      Other Instructions Thank you for choosing [facility]!    Important Information About Sugar

## 2021-11-24 NOTE — Addendum Note (Signed)
Addended by: Levonne Hubert on: 11/24/2021 01:34 PM   Modules accepted: Orders

## 2021-11-24 NOTE — Progress Notes (Signed)
Cardiology CONSULT Note    Date:  11/24/2021   ID:  Alison Cuevas, DOB 1946-02-21, MRN [id number]  PCP:  [name], MD  Cardiologist:  Fransico Him, MD   Chief Complaint  Patient presents with   New Patient (Initial Visit)    Syncope and HTN    History of Present Illness:  Alison Cuevas is a 76 y.o. female who is being seen today for the evaluation of Syncope at the request of Long, Wonda Olds, MD.  This is a 76yo female with a hx of remote CVA, HTN, CKD stage 2, HLD who is referred today for evaluation recent syncope.  Apparently she was sitting in her wheelchair eating lunch and talking and had sudden onset of lightheadedness and then had syncope.  She was having pain in her arm in leg from her CVA.  This was witnessed by her family.  There was no witnessed seizure activity.  She was unresponsive for about 40 minutes and then EMS arrived and gave her Zofran and then she had nausea and vomiting.   She was given IV fluids with improvement in her symptoms.  Troponin was negative x2 and labs were essentially normal.  EKG showed normal sinus rhythm with no acute changes.  It was felt that she had vasovagal syncope.  She is now referred for further follow-up.  Apparently she has had syncope with GI upset and on the toilet in the past with last episode 4 years ago.  She has not had any further episodes of syncope or dizziness since this last episode in the ER.  She denies any chest pain, pressure, shortness of breath, PND, orthopnea.  Rarely she will have some palpitations.   Past Medical History:  Diagnosis Date   Allergic rhinitis    maple pollen   Anxiety    Arthritis    Bowel obstruction (HCC)    Chronic low back pain    Dr. Trenton Gammon.  Has had back injection--bp elevated after.   Chronic pain syndrome    Chronic renal insufficiency, stage 2 (mild)    GFR 60-70   DDD (degenerative disc disease), cervical    Hx of ACDF (Dr. [name]).  Followed by Dr. [name].  Also, Dr.  [name] do left C7-T1 intralaminal epidural injection.   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  Resid R arm and leg weakness + flexion contractures.   Hypercholesteremia    mild; pt declined statin trial 05/2014--needs recheck lipid panel at first f/u visit in 2017   Hypertension    Osteopenia 06/2017   T score -1.4 FRAX 15% / 2%   Ovarian cyst 2017   82019-robotic assisted bilatel SPO: all path benign.   Perianal dermatitis    prn cutivate   Phlebitis    Right hemiplegia ([place]) 01/2020   R arm and leg (hemorrhagic L cerebral hemisphere CVA)   Right knee injury 2018   Patellofemoral crush injury--Dr. [name].   Solitary thyroid nodule 05/2021   needs bx->plan endo ref   Stroke [facility])    Trochanteric bursitis of right hip    Recurrent (injection by Dr. 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 [name]). Recurrent fungal/bact OE.  Varicose vein    left leg     Past Surgical History:  Procedure Laterality Date   ABDOMINAL HYSTERECTOMY  03/2015   TAH.  Last pap 2016.  No hx of abnl paps.  Per GYN, no further pap smears indicated.   ANTERIOR AND POSTERIOR REPAIR N/A 03/18/2014   Procedure: Cystocele repair with graft, Vault suspension, Rectocele repair;  Surgeon: [name], MD;  Location: [facility];  Service: Urology;  Laterality: N/A;   BIOPSY THYROID  06/2021   benign   CHOLECYSTECTOMY     CHOLECYSTECTOMY, LAPAROSCOPIC     COLON SURGERY     COLONOSCOPY  04/2006; 05/26/16   2007 (Dr. [name]): Normal.  04/2016 (Dr. [name]) normal except diverticulosis and decreased anal sphincter tone.  No repeat colonoscopy is recommended due to age.   cspine  surgery     Dr. Wiliam Ke level ant cerv discectomy [name] w/plating   CYSTO N/A 03/18/2014   Procedure: CYSTO;  Surgeon: [name], MD;  Location: [facility];  Service: Urology;  Laterality: N/A;   DEXA  07/30/2015; 06/2018   2017 and 2020 -->Osteopenia--repeat 2 yrs. T score -2.29 Mar 2021. Rpt 2 yrs.   LYSIS OF ADHESION N/A 01/31/2018   Procedure: POSSIBLE LYSIS OF ADHESION;  Surgeon: Everitt Amber, MD;  Location: [facility];  Service: Gynecology;  Laterality: N/A;   [place]     with prolasped bledder repair   RESECTION OF COLON     BENIGN TUMOR   RIGHT EAR SURGERY  08/23/2017   Dr. 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  Procedure: XI ROBOTIC ASSISTED BILATERAL SALPINGO OOPHORECTOMY;  Surgeon: Everitt Amber, MD;  Location: [facility];  Service: Gynecology;  Laterality: Bilateral;   TRANSTHORACIC ECHOCARDIOGRAM  01/27/2020   EF 65-70%, NORMAL.   TUBAL LIGATION      Current Medications: Current Meds  Medication Sig   acetaminophen (TYLENOL) 500 MG tablet Take 1 tablet (500 mg total) by mouth 3 (three) times daily.   ALPRAZolam (XANAX) 0.5 MG tablet TAKE 1 TABLET BY MOUTH THREE TIMES A DAY AS NEEDED FOR ANXIETY   aspirin EC 81 MG tablet Take 1 tablet (81 mg total) by mouth daily. Swallow whole.   Biotin 5 MG TABS Take 5 mg by mouth daily.   fluticasone (FLONASE) 50 MCG/ACT nasal spray Place 2 sprays into both nostrils at bedtime.   HYDROcodone-acetaminophen (NORCO) 10-325 MG tablet Take 1 tablet by  mouth every 6 (six) hours as needed.   ibuprofen (ADVIL) 200 MG tablet Take 200 mg by mouth every 6 (six) hours as needed.   lisinopril (ZESTRIL) 5 MG tablet Take 1 tablet (5 mg total) by mouth daily.   loratadine (CLARITIN) 10 MG tablet Take 10 mg by mouth every other day.    Multiple Minerals-Vitamins (CALCIUM-MAGNESIUM-ZINC-D3 PO) Take 1 tablet by mouth daily.    pantoprazole (PROTONIX) 40 MG tablet TAKE 1 TABLET BY MOUTH EVERYDAY AT BEDTIME   polyethylene glycol (MIRALAX / GLYCOLAX) 17 g packet Take 17 g by mouth daily as needed.   potassium  chloride SA (KLOR-CON M20) 20 MEQ tablet Take 1 tablet (20 mEq total) by mouth daily.   Sodium Fluoride (CLINPRO 5000) 1.1 % PSTE Clinpro 5000 1.1 % dental paste   tiZANidine (ZANAFLEX) 2 MG tablet TAKE 1 TABLET BY MOUTH 3 TIMES DAILY AS NEEDED FOR MUSCLE RELAXATION   verapamil (CALAN-SR) 180 MG CR tablet TAKE 1 TABLET BY MOUTH EVERY DAY    Allergies:   Gabapentin and Prednisone   Social History   Socioeconomic History   Marital status: Widowed    Spouse name: [name]   Number of children: 3   Years of education: Not on file   Highest education level: 9th grade  Occupational History   Occupation: retired  Tobacco Use   Smoking status: Never   Smokeless tobacco: Never  Vaping Use   Vaping Use: Never used  Substance and Sexual Activity   Alcohol use: No    Alcohol/week: 0.0 standard drinks   Drug use: No   Sexual activity: Not Currently    Birth control/protection: Surgical    Comment: hysterectomy  Other Topics Concern   Not on file  Social History Narrative   Widow (2022), has 3 children (one lives near her).  Has 8 grandchildren, 2 greatgrandchildren.  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  No alcohol.  No drugs.Exercise: clean, work in yard.  10 siblings in all--5 have passed away--1 child age 77 with whooping cough , 1 lupus, 1 melanoma,1 lung cancer, 1 pulm  fibrosis   Social Determinants of Health   Financial Resource Strain: Low Risk    Difficulty of Paying Living Expenses: Not hard at all  Food Insecurity: No Food Insecurity   Worried About Charity fundraiser in the Last Year: Never true   Ran Out of Food in the Last Year: Never true  Transportation Needs: No Transportation Needs   Lack of Transportation (Medical): No   Lack of Transportation (Non-Medical): No  Physical Activity: Insufficiently Active   Days of Exercise per Week: 2 days   Minutes of Exercise per Session: 30 min  Stress: No Stress Concern Present   Feeling of Stress : Not at all  Social Connections: Moderately Isolated   Frequency of Communication with Friends and Family: More than three times a week   Frequency of Social Gatherings with Friends and Family: Once a week   Attends Religious Services: 1 to 4 times per year   Active Member of [employer] or Organizations: No   Attends Archivist Meetings: Never   Marital Status: Widowed     Family History:  The patient's family history includes Breast cancer (age of onset: 108) in her sister; Diverticulitis in her mother; Heart attack in her father; Heart failure in her father; Hypertension in her mother; Leukemia in her brother; Lung disease in her brother; Lupus in her sister; Melanoma in her brother; Other in an other family member; Prostate cancer in her brother; Stomach cancer in her father; Thyroid disease in her mother.   ROS:   Please see the history of present illness.    ROS All other systems reviewed and are negative.      View : No data to display.             PHYSICAL EXAM:   VS:  BP (!) 122/58   Pulse 82   Ht 5' (1.524 m)   Wt 121 lb (54.9 kg)   LMP 06/27/1972  SpO2 96%   BMI 23.63 kg/m    GEN: Well nourished, well developed, in no acute distress  HEENT: normal  Neck: no JVD, carotid bruits, or masses Cardiac: RRR; no murmurs, rubs, or gallops,no edema.  Intact distal pulses  bilaterally.  Respiratory:  clear to auscultation bilaterally, normal work of breathing GI: soft, nontender, nondistended, + BS MS: no deformity or atrophy  Skin: warm and dry, no rash Neuro:  Alert and Oriented x 3, Strength and sensation are intact Psych: euthymic mood, full affect  Wt Readings from Last 3 Encounters:  11/24/21 121 lb (54.9 kg)  11/11/21 117 lb 3.2 oz (53.2 kg)  11/02/21 116 lb (52.6 kg)      Studies/Labs Reviewed:   EKG:  EKG is not ordered today.    Recent Labs: 11/02/2021: ALT 19; BUN 16; Creatinine, Ser 0.72; Hemoglobin 12.5; Platelets 183; Potassium 3.5; Sodium 131   Lipid Panel    Component Value Date/Time   CHOL 168 11/11/2021 1341   TRIG 44.0 11/11/2021 1341   HDL 72.90 11/11/2021 1341   CHOLHDL 2 11/11/2021 1341   VLDL 8.8 11/11/2021 1341   LDLCALC 87 11/11/2021 1341   LDLDIRECT 136.9 09/20/2007 1009    Additional studies/ records that were reviewed today include:  ER notes    ASSESSMENT:    1. Syncope, unspecified syncope type   2. Primary hypertension   3. Left bundle branch block      PLAN:  In order of problems listed above:  Syncope -Sounds vasovagal in origin -She has had episodes of passing out in the past with GI symptoms as well as when on the toilet -she has chronic pain in her arm and leg after a CVA and may have been the inciting event -EKG in the ER showed normal sinus rhythm with left bundle branch block which is old compared to EKG of 2015 -Orthostatics in the office were negative -Recommend 30-day event monitor to rule out bradycardia arrhythmias>> if she has any further syncopal episodes may need to consider loop recorder -Check 2D echo to assess LV function -no carotid artery disease by CTA of the head and neck in 2021  2.  HTN -BP controlled on exam today -Continue prescription drug management with Lisinopril '5mg'$  daily and Verapamil SR '180mg'$  daily  3.  Abnormal EKG -EKG shows left bundle branch block which  dates clear back to 2015 -She has never had an ischemic work-up so we will get a Lexiscan Myoview to rule out ischemia -Shared Decision Making/Informed Consent The risks [chest pain, shortness of breath, cardiac arrhythmias, dizziness, blood pressure fluctuations, myocardial infarction, stroke/transient ischemic attack, nausea, vomiting, allergic reaction, radiation exposure, metallic taste sensation and life-threatening complications (estimated to be 1 in 10,000)], benefits (risk stratification, diagnosing coronary artery disease, treatment guidance) and alternatives of a nuclear stress test were discussed in detail with Alison Cuevas and she agrees to proceed.  Time Spent: 25 minutes total time of encounter, including 15 minutes spent in face-to-face patient care on the date of this encounter. This time includes coordination of care and counseling regarding above mentioned problem list. Remainder of non-face-to-face time involved reviewing chart documents/testing relevant to the patient encounter and documentation in the medical record. I have independently reviewed documentation from referring provider  Medication Adjustments/Labs and Tests Ordered: Current medicines are reviewed at length with the patient today.  Concerns regarding medicines are outlined above.  Medication changes, Labs and Tests ordered today are listed in the Patient Instructions below.  There  are no Patient Instructions on file for this visit.   Signed, Fransico Him, MD  11/24/2021 1:01 PM    [facility] [address], [place], [place]  [address] Phone: [phone]; Fax: [phone]

## 2021-11-25 ENCOUNTER — Other Ambulatory Visit (HOSPITAL_COMMUNITY): Payer: Self-pay | Admitting: Family Medicine

## 2021-11-25 ENCOUNTER — Telehealth: Payer: Self-pay | Admitting: Cardiology

## 2021-11-25 DIAGNOSIS — Z1231 Encounter for screening mammogram for malignant neoplasm of breast: Secondary | ICD-10-CM

## 2021-11-25 NOTE — Telephone Encounter (Signed)
Patient is requesting to speak with clinical staff. She states she spoke with someine to discuss instructions for 6/06 stress test, but she wants to be sure she recmembered everything correctly. She states she heard message regarding fasting and she has a few questions.

## 2021-11-25 NOTE — Telephone Encounter (Signed)
Returned call to pt and all questions answered.

## 2021-11-29 ENCOUNTER — Telehealth: Payer: Self-pay | Admitting: Cardiology

## 2021-11-29 NOTE — Telephone Encounter (Signed)
Pt stated that she does not want to wear a cardiac monitor at this time. Pt stated that she has not had a presyncopal episode and believes they were being caused by stress. Pt stated that she also canceled Lexiscan scheduled as she cannot get around too well with the pain that she has in her leg. Pt's monitor was shipped out to her home, informed pt that she can ship it back to the company when she receives it. Pt aware to give [facility] a call when she wants to r/s stress test.  I will fwd to Dr. Radford Pax as Juluis Rainier.

## 2021-11-29 NOTE — Telephone Encounter (Signed)
New Message:     Patient says she does not want the monitor at this time. Please cancel the order for it.

## 2021-11-30 ENCOUNTER — Encounter (HOSPITAL_COMMUNITY): Payer: Medicare Other

## 2021-12-01 ENCOUNTER — Ambulatory Visit (HOSPITAL_COMMUNITY)
Admission: RE | Admit: 2021-12-01 | Discharge: 2021-12-01 | Disposition: A | Discharge status: Home / Self Care | Payer: Medicare Other | Source: Ambulatory Visit | Attending: Cardiology | Admitting: Cardiology

## 2021-12-01 ENCOUNTER — Encounter: Payer: Self-pay | Admitting: Family Medicine

## 2021-12-01 ENCOUNTER — Other Ambulatory Visit (HOSPITAL_COMMUNITY): Payer: Medicare Other

## 2021-12-01 DIAGNOSIS — R55 Syncope and collapse: Secondary | ICD-10-CM | POA: Insufficient documentation

## 2021-12-01 LAB — ECHOCARDIOGRAM COMPLETE
Area-P 1/2: 3.17 cm2
S' Lateral: 2.2 cm

## 2021-12-01 NOTE — Progress Notes (Signed)
*  PRELIMINARY RESULTS* Echocardiogram 2D Echocardiogram has been performed.  Alison Cuevas 12/01/2021, 4:00 PM

## 2021-12-02 ENCOUNTER — Telehealth: Payer: Self-pay | Admitting: Cardiology

## 2021-12-02 NOTE — Telephone Encounter (Signed)
Follow Up:     Patient said she talked to a nurse this morning about her results. She says she needs to talk to her again when she have time please.

## 2021-12-02 NOTE — Telephone Encounter (Signed)
Patient said she will get nuclear stress test done but does not want to wear the monitor.She says it is something she cannot wear and wants to mail it back.

## 2021-12-14 DIAGNOSIS — H90A22 Sensorineural hearing loss, unilateral, left ear, with restricted hearing on the contralateral side: Secondary | ICD-10-CM | POA: Diagnosis not present

## 2021-12-14 DIAGNOSIS — H90A31 Mixed conductive and sensorineural hearing loss, unilateral, right ear with restricted hearing on the contralateral side: Secondary | ICD-10-CM | POA: Diagnosis not present

## 2021-12-14 DIAGNOSIS — Z9889 Other specified postprocedural states: Secondary | ICD-10-CM | POA: Diagnosis not present

## 2021-12-14 DIAGNOSIS — Z888 Allergy status to other drugs, medicaments and biological substances status: Secondary | ICD-10-CM | POA: Diagnosis not present

## 2021-12-14 DIAGNOSIS — H7291 Unspecified perforation of tympanic membrane, right ear: Secondary | ICD-10-CM | POA: Diagnosis not present

## 2021-12-22 ENCOUNTER — Ambulatory Visit (HOSPITAL_COMMUNITY): Payer: Medicare Other

## 2021-12-23 ENCOUNTER — Ambulatory Visit (HOSPITAL_COMMUNITY)
Admission: RE | Admit: 2021-12-23 | Discharge: 2021-12-23 | Disposition: A | Discharge status: Home / Self Care | Payer: Medicare Other | Source: Ambulatory Visit | Attending: Family Medicine | Admitting: Family Medicine

## 2021-12-23 DIAGNOSIS — Z1231 Encounter for screening mammogram for malignant neoplasm of breast: Secondary | ICD-10-CM | POA: Diagnosis not present

## 2022-01-13 DIAGNOSIS — L298 Other pruritus: Secondary | ICD-10-CM | POA: Diagnosis not present

## 2022-01-13 DIAGNOSIS — L603 Nail dystrophy: Secondary | ICD-10-CM | POA: Diagnosis not present

## 2022-01-13 DIAGNOSIS — L820 Inflamed seborrheic keratosis: Secondary | ICD-10-CM | POA: Diagnosis not present

## 2022-01-19 ENCOUNTER — Ambulatory Visit (INDEPENDENT_AMBULATORY_CARE_PROVIDER_SITE_OTHER): Payer: Medicare Other | Admitting: Family Medicine

## 2022-01-19 ENCOUNTER — Encounter: Payer: Self-pay | Admitting: Family Medicine

## 2022-01-19 VITALS — BP 148/80 | HR 65 | Ht 60.0 in | Wt 120.2 lb

## 2022-01-19 DIAGNOSIS — Z79899 Other long term (current) drug therapy: Secondary | ICD-10-CM

## 2022-01-19 DIAGNOSIS — G894 Chronic pain syndrome: Secondary | ICD-10-CM

## 2022-01-19 DIAGNOSIS — L602 Onychogryphosis: Secondary | ICD-10-CM | POA: Diagnosis not present

## 2022-01-19 MED ORDER — HYDROCODONE-ACETAMINOPHEN 5-325 MG PO TABS: 1.0000 | ORAL_TABLET | Freq: Four times a day (QID) | ORAL | 0 refills | Status: DC | PRN

## 2022-01-19 NOTE — Progress Notes (Unsigned)
OFFICE VISIT  01/20/2022  CC:  Chief Complaint  Patient presents with   Fungus under toenails    Pt reports fungus under 2 of her toenails and needs trim   Patient is a 76 y.o. female who presents for toenails concern.  HPI: Left second toenail growing straight up and thickened.  No pain, just bothers her some when wearing sock and shoe.  It does feel loose to her.  Chronic pain right leg, particularly from lower leg down through ankle and foot due to chronic flexion contracture.  Also, her right hip and knee as well as sometimes in the thigh bothers her more over the last year.  She has right leg muscle atrophy due to her history of stroke with residual right leg weakness. She sometimes takes 200 to 400 mg of ibuprofen and this helps most of the time.  When trying to get to sleep sometimes it bothers her so much that she will take one of her hydrocodone 10 mg, but only half tab at a time and this is sufficient.  She requests a new prescription for the only the 5-3 25 strength.  She takes an alprazolam and a Zanaflex around bedtime as well.    She was prescribed carbamazepine a few months ago by Dr. Tessa Lerner.  However patient states she has been scared to start it, says she does not know really what is for.  PMP AWARE reviewed today: most recent rx for Vicodin 10-325 was filled 08/27/2021, #20, rx by me. Most recent alprazolam prescription filled 10/17/2021, #90, prescription by me No red flags.   Past Medical History:  Diagnosis Date   Allergic rhinitis    maple pollen   Anxiety    Arthritis    Bowel obstruction (HCC)    Chronic low back pain    Dr. Trenton Gammon.  Has had back injection--bp elevated after.   Chronic pain syndrome    Chronic renal insufficiency, stage 2 (mild)    GFR 60-70   DDD (degenerative disc disease), cervical    Hx of ACDF (Dr. [name]).  Followed by Dr. [name].  Also, Dr. [name] do left C7-T1 intralaminal epidural injection.   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  Resid R arm and leg weakness + flexion contractures.   Hypercholesteremia    mild; pt declined statin trial 05/2014--needs recheck lipid panel at first f/u visit in 2017   Hypertension    LBBB (left bundle branch block)    Osteopenia 06/2017   T score -1.4 FRAX 15% / 2%   Ovarian cyst 2017   82019-robotic assisted bilatel SPO: all path benign.   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 [name].   Solitary thyroid nodule 05/2021   needs bx->plan endo ref   Syncope 10/2021   cardiac w/u as per Dr. Radford Pax   Trochanteric bursitis of right hip    Recurrent (injection by Dr. 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 [name]). Recurrent fungal/bact OE.   Varicose vein  left leg     Past Surgical History:  Procedure Laterality Date   ABDOMINAL HYSTERECTOMY  03/2015   TAH.  Last pap 2016.  No hx of abnl paps.  Per GYN, no further pap smears indicated.   ANTERIOR AND POSTERIOR REPAIR N/A 03/18/2014   Procedure: Cystocele repair with graft, Vault suspension, Rectocele repair;  Surgeon: [name], MD;  Location: [facility];  Service: Urology;  Laterality: N/A;   BIOPSY THYROID  06/2021   benign   CHOLECYSTECTOMY     CHOLECYSTECTOMY, LAPAROSCOPIC     COLON SURGERY     COLONOSCOPY  04/2006; 05/26/16   2007 (Dr. [name]): Normal.  04/2016 (Dr. [name]) normal except diverticulosis and decreased anal sphincter tone.  No repeat colonoscopy is recommended due to age.   cspine surgery     Dr. Wiliam Ke  level ant cerv discectomy [name] w/plating   CYSTO N/A 03/18/2014   Procedure: CYSTO;  Surgeon: [name], MD;  Location: [facility];  Service: Urology;  Laterality: N/A;   DEXA  07/30/2015; 06/2018   2017 and 2020 -->Osteopenia--repeat 2 yrs. T score -2.29 Mar 2021. Rpt 2 yrs.   LYSIS OF ADHESION N/A 01/31/2018   Procedure: POSSIBLE LYSIS OF ADHESION;  Surgeon: Everitt Amber, MD;  Location: [facility];  Service: Gynecology;  Laterality: N/A;   [place]     with prolasped bledder repair   RESECTION OF COLON     BENIGN TUMOR   RIGHT EAR SURGERY  08/23/2017   Dr. 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  Procedure: XI ROBOTIC ASSISTED BILATERAL SALPINGO OOPHORECTOMY;  Surgeon: Everitt Amber, MD;  Location: [facility];  Service: Gynecology;  Laterality: Bilateral;   TRANSTHORACIC ECHOCARDIOGRAM  01/27/2020   2021 EF 65-70%, NORMAL. 12/01/21 grd I DD, o/w normal.   TUBAL LIGATION      Outpatient Medications Prior to Visit  Medication Sig Dispense Refill   acetaminophen (TYLENOL) 500 MG tablet Take 1 tablet (500 mg total) by mouth 3 (three) times daily. 30 tablet 0   ALPRAZolam (XANAX) 0.5 MG tablet TAKE 1 TABLET BY MOUTH THREE TIMES A DAY AS NEEDED FOR ANXIETY 90 tablet 5   aspirin EC 81 MG tablet Take 1 tablet (81 mg total) by mouth daily. Swallow whole. 30 tablet 11   Biotin 5 MG TABS Take 5 mg by mouth daily.     fluticasone (FLONASE) 50 MCG/ACT nasal spray Place 2 sprays into both nostrils at bedtime. 48  g 3   ibuprofen (ADVIL) 200 MG tablet Take 200 mg by mouth every 6 (six) hours as needed.     lisinopril (ZESTRIL) 5 MG tablet Take 1 tablet (5 mg total) by mouth daily. 90 tablet 1   loratadine (CLARITIN) 10 MG tablet Take 10 mg by mouth every other day.      meclizine (ANTIVERT) 12.5 MG tablet Take 1 tablet (12.5 mg total) by mouth 3 (three) times daily. 30 tablet 0   Multiple Minerals-Vitamins (CALCIUM-MAGNESIUM-ZINC-D3 PO) Take 1 tablet by mouth daily.      nystatin ointment (  MYCOSTATIN) Apply 1 application topically 2 (two) times daily. Apply to affected area for up to 7 days. 30 g 0   pantoprazole (PROTONIX) 40 MG tablet TAKE 1 TABLET BY MOUTH EVERYDAY AT BEDTIME 90 tablet 1   polyethylene glycol (MIRALAX / GLYCOLAX) 17 g packet Take 17 g by mouth daily as needed.     potassium chloride SA (KLOR-CON M20) 20 MEQ tablet Take 1 tablet (20 mEq total) by mouth daily. 90 tablet 1   Sodium Fluoride (CLINPRO 5000) 1.1 % PSTE Clinpro 5000 1.1 % dental paste     tiZANidine (ZANAFLEX) 2 MG tablet TAKE 1 TABLET BY MOUTH 3 TIMES DAILY AS NEEDED FOR MUSCLE RELAXATION 270 tablet 0   triamcinolone ointment (KENALOG) 0.1 %      verapamil (CALAN-SR) 180 MG CR tablet TAKE 1 TABLET BY MOUTH EVERY DAY 90 tablet 1   HYDROcodone-acetaminophen (NORCO) 10-325 MG tablet Take 1 tablet by mouth every 6 (six) hours as needed. 20 tablet 0   carbamazepine (TEGRETOL-XR) 100 MG 12 hr tablet Take 1 tablet (100 mg total) by mouth 2 (two) times daily. (Patient not taking: Reported on 11/11/2021) 60 tablet 2   clobetasol (TEMOVATE) 0.05 % external solution Apply topically.     No facility-administered medications prior to visit.    Allergies  Allergen Reactions   Gabapentin Anxiety    Elevated heart rate and crazy dreams   Prednisone Anxiety and Other (See Comments)    REACTION: funny feeling    ROS As per HPI  PE:    01/19/2022    2:35 PM 01/19/2022    1:54 PM 11/24/2021   12:45 PM  Vitals with BMI  Height  5'  0" '5\' 0"'$   Weight  120 lbs 3 oz 121 lbs  BMI  51.02 58.52  Systolic 778 242 353  Diastolic 80 73 58  Pulse  65 82     Physical Exam  Left foot, second toe with markedly thickened and brittle nail, primarily curved upward. No surrounding inflammation or swelling of the soft tissue. Right hip passive range of motion intact without pain. Right knee without tenderness or erythema.  Flexion to 70 degrees, extension to 160 degrees.  LABS:  Last CBC Lab Results  Component Value Date   WBC 7.6 11/02/2021   HGB 12.5 11/02/2021   HCT 35.8 (L) 11/02/2021   MCV 89.9 11/02/2021   MCH 31.4 11/02/2021   RDW 12.4 11/02/2021   PLT 183 61/44/3154   Last metabolic panel Lab Results  Component Value Date   GLUCOSE 125 (H) 11/02/2021   NA 131 (L) 11/02/2021   K 3.5 11/02/2021   CL 99 11/02/2021   CO2 25 11/02/2021   BUN 16 11/02/2021   CREATININE 0.72 11/02/2021   GFRNONAA >60 11/02/2021   CALCIUM 8.8 (L) 11/02/2021   PROT 6.4 (L) 11/02/2021   ALBUMIN 3.7 11/02/2021   BILITOT 0.5 11/02/2021   ALKPHOS 66 11/02/2021   AST 25 11/02/2021   ALT 19 11/02/2021   ANIONGAP 7 11/02/2021    IMPRESSION AND PLAN:  #1 left second toe nail plate deformity. I tried to trim this today but the nail was too brittle and loose. She is going to try to get in with a podiatrist to see if it can be filed down. We discussed the option of just removing the toenail and we decided against this today since it is really not a painful condition at this time.  #2 chronic pain syndrome. Stable.  She  will continue to use Motrin in small amounts. Additionally can continue hydrocodone/APAP and we will change the strength of her pill to the 5/325, 1 4 times daily as needed, #60. I told her that I do not know exactly why the carbamazepine was prescribed for her but I presume it was for treatment of any neuropathic component of her pain.  We discussed use of this today and using shared decision making process decided  she is going to hold off on starting this. Of note, she did not tolerate gabapentin in the past.  An After Visit Summary was printed and given to the patient.  FOLLOW UP: Return for Keep appointment already set for 02/17/2022.  Signed:  Crissie Sickles, MD           01/20/2022

## 2022-01-21 ENCOUNTER — Other Ambulatory Visit: Payer: Self-pay | Admitting: Family Medicine

## 2022-01-26 ENCOUNTER — Encounter: Payer: Self-pay | Admitting: Physical Medicine & Rehabilitation

## 2022-01-26 ENCOUNTER — Encounter: Payer: Medicare Other | Attending: Physical Medicine & Rehabilitation | Admitting: Physical Medicine & Rehabilitation

## 2022-01-26 VITALS — BP 136/74 | HR 74 | Ht 60.0 in | Wt 120.0 lb

## 2022-01-26 DIAGNOSIS — G8111 Spastic hemiplegia affecting right dominant side: Secondary | ICD-10-CM | POA: Diagnosis not present

## 2022-01-26 NOTE — Patient Instructions (Signed)
TRY TAKING CARBAMAZEPINE AT NIGHT BY ITSELF (?8PM'ISH) BEFORE YOU TAKE THE OTHER BEDTIME MEDS.   IF YOU TOLERATE IT OVER A FEW DAYS, THEN YOU CAN BEGIN THE  MORNING DOSE.

## 2022-01-26 NOTE — Progress Notes (Signed)
Subjective:    Patient ID: Alison Cuevas, female    DOB: 06-24-1946, 76 y.o.   MRN: [id number]  HPI Alison Cuevas is here in follow-up of her left sided intracranial hemorrhage and associated spastic right hemiparesis.  We performed phenol injection to the right tibial nerve at her last visit and she has some benefit with this.  She still is frustrated by her ongoing spasticity and inability to walk as she wants did.  She asks if she is ever going to get back to where she was before.  She does use her JAS splint regularly.  She does stretches a lot at home and family remains supportive of these efforts also.  She is using low-dose tizanidine at nighttime.  I believe we have tried baclofen in the past that she did not do so well with this.  At her last visit we initiated carbamazepine low-dose for neuropathic pain.  The patient never ended up using the medication as she was afraid of her tolerance of the medication.  She still complains of neuropathic pain and sensitivity in the foot.  She does have a brace that seems to be fitting generally appropriately although her foot "still turns in".  She had a picture of another type of splint today which was a lower profile graphite splint.  We discussed the difference tween her current splint and the splint together today in the office.  Alison Cuevas also discussed her right upper extremity.  She is using it more but has noticed a tremor.  Does not seem that the tremor is impairing her great deal however.  Pain Inventory Average Pain 4 Pain Right Now 7 My pain is constant, tingling, and aching  LOCATION OF PAIN  neck, right shoulder, right elbow, right wrist, right hand & fingers, back, right buttock, right groin, right hip, right thigh, right knee, right leg, right ankle, right toes.   BOWEL Number of stools per week: 7 or more Oral laxative use Yes  Type of laxative Miralax   BLADDER Normal and Pads  Mobility walk with assistance use a  walker how many minutes can you walk? 60 feet ability to climb steps?  no use a wheelchair transfers alone Do you have any goals in this area?  yes  Function retired I need assistance with the following:  bathing and shopping Do you have any goals in this area?  yes  Neuro/Psych weakness numbness tingling trouble walking spasms anxiety  Prior Studies Any changes since last visit?  no, Thyroid Biopsy  Physicians involved in your care Any changes since last visit?  no   Family History  Problem Relation Age of Onset   Thyroid disease Mother    Hypertension Mother    Diverticulitis Mother    Stomach cancer Father    Heart attack Father    Heart failure Father    Breast cancer Sister 79   Lupus Sister    Melanoma Brother    Prostate cancer Brother    Leukemia Brother    Lung disease Brother    Other Other        Family member with MGUS   Social History   Socioeconomic History   Marital status: Widowed    Spouse name: [name]   Number of children: 3   Years of education: Not on file   Highest education level: 9th grade  Occupational History   Occupation: retired  Tobacco Use   Smoking status: Never   Smokeless tobacco: Never  Vaping Use   Vaping Use: Never used  Substance and Sexual Activity   Alcohol use: No    Alcohol/week: 0.0 standard drinks of alcohol   Drug use: No   Sexual activity: Not Currently    Birth control/protection: Surgical    Comment: hysterectomy  Other Topics Concern   Not on file  Social History Narrative   Widow (2022), has 3 children (one lives near her).  Has 8 grandchildren, 2 greatgrandchildren.  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  No alcohol.  No drugs.Exercise: clean, work in yard.  10 siblings in all--5 have passed away--1 child age 91 with whooping cough , 1 lupus, 1 melanoma,1 lung cancer, 1 pulm fibrosis   Social Determinants of Health    Financial Resource Strain: Low Risk  (07/14/2021)   Overall Financial Resource Strain (CARDIA)    Difficulty of Paying Living Expenses: Not hard at all  Food Insecurity: No Food Insecurity (07/14/2021)   Hunger Vital Sign    Worried About Running Out of Food in the Last Year: Never true    Ran Out of Food in the Last Year: Never true  Transportation Needs: No Transportation Needs (07/14/2021)   PRAPARE - Hydrologist (Medical): No    Lack of Transportation (Non-Medical): No  Physical Activity: Insufficiently Active (07/14/2021)   Exercise Vital Sign    Days of Exercise per Week: 2 days    Minutes of Exercise per Session: 30 min  Stress: No Stress Concern Present (07/14/2021)   [place]    Feeling of Stress : Not at all  Recent Concern: Stress - Stress Concern Present (05/06/2021)   [place]    Feeling of Stress : To some extent  Social Connections: Moderately Isolated (07/14/2021)   Social Connection and Isolation Panel [NHANES]    Frequency of Communication with Friends and Family: More than three times a week    Frequency of Social Gatherings with Friends and Family: Once a week    Attends Religious Services: 1 to 4 times per year    Active Member of [employer] or Organizations: No    Attends Archivist Meetings: Never    Marital Status: Widowed   Past Surgical History:  Procedure Laterality Date   ABDOMINAL HYSTERECTOMY  03/2015   TAH.  Last pap 2016.  No hx of abnl paps.  Per GYN, no further pap smears indicated.   ANTERIOR AND POSTERIOR REPAIR N/A 03/18/2014   Procedure: Cystocele repair with graft, Vault suspension, Rectocele repair;  Surgeon: [name], MD;  Location: [facility];  Service: Urology;  Laterality: N/A;   BIOPSY THYROID  06/2021   benign   CHOLECYSTECTOMY     CHOLECYSTECTOMY, LAPAROSCOPIC      COLON SURGERY     COLONOSCOPY  04/2006; 05/26/16   2007 (Dr. [name]): Normal.  04/2016 (Dr. [name]) normal except diverticulosis and decreased anal sphincter tone.  No repeat colonoscopy is recommended due to age.   cspine surgery     Dr. Wiliam Ke level ant cerv discectomy [name] w/plating   CYSTO N/A 03/18/2014   Procedure: CYSTO;  Surgeon: [name], MD;  Location: [facility];  Service: Urology;  Laterality: N/A;   DEXA  07/30/2015; 06/2018   2017 and 2020 -->Osteopenia--repeat 2 yrs. T score -2.29 Mar 2021. Rpt 2 yrs.   LYSIS OF  ADHESION N/A 01/31/2018   Procedure: POSSIBLE LYSIS OF ADHESION;  Surgeon: Everitt Amber, MD;  Location: [facility];  Service: Gynecology;  Laterality: N/A;   [place]     with prolasped bledder repair   RESECTION OF COLON     BENIGN TUMOR   RIGHT EAR SURGERY  08/23/2017   Dr. 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)   right hemicolectomy for diverticulitis with abscess  1993   ROBOTIC ASSISTED BILATERAL SALPINGO OOPHERECTOMY Bilateral 01/31/2018   All PATH benign.  Procedure: XI ROBOTIC ASSISTED BILATERAL SALPINGO OOPHORECTOMY;  Surgeon: Everitt Amber, MD;  Location[facility];  Service: Gynecology;  Laterality: Bilateral;   TRANSTHORACIC ECHOCARDIOGRAM  01/27/2020   2021 EF 65-70%, NORMAL. 12/01/21 grd I DD, o/w normal.   TUBAL LIGATION     Past Medical History:  Diagnosis Date   Allergic rhinitis    maple pollen   Anxiety    Arthritis    Bowel obstruction (HCC)    Chronic low back pain    Dr. Trenton Gammon.  Has had back injection--bp elevated after.   Chronic pain syndrome    Chronic renal insufficiency, stage 2  (mild)    GFR 60-70   DDD (degenerative disc disease), cervical    Hx of ACDF (Dr. [name]).  Followed by Dr. [name].  Also, Dr. [name] do left C7-T1 intralaminal epidural injection.   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  Resid R arm and leg weakness + flexion contractures.   Hypercholesteremia    mild; pt declined statin trial 05/2014--needs recheck lipid panel at first f/u visit in 2017   Hypertension    LBBB (left bundle branch block)    Osteopenia 06/2017   T score -1.4 FRAX 15% / 2%   Ovarian cyst 2017   82019-robotic assisted bilatel SPO: all path benign.   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 [name].   Solitary thyroid nodule 05/2021   needs bx->plan endo ref   Syncope 10/2021   cardiac w/u as per Dr. Radford Pax   Trochanteric bursitis of right hip    Recurrent (injection by Dr. 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 [name]). Recurrent fungal/bact OE.   Varicose vein    left leg    Ht 5' (1.524 m)   Wt 120 lb (54.4 kg)   LMP 06/27/1972   BMI 23.44 kg/m   Opioid Risk Score:   Fall Risk Score:  `1  Depression screen PHQ 2/9     11/11/2021    1:16 PM 11/10/2021    3:29 PM 08/04/2021    3:24 PM 07/14/2021   10:56 AM 05/05/2021    3:12 PM 10/14/2020   10:40 AM 06/10/2020  11:40 AM  Depression screen PHQ 2/9  Decreased Interest 0 0 0 0 0 1 2  Down, Depressed, Hopeless 0 0 0 0 0 1 2  PHQ - 2 Score 0 0 0 0 0 2 4    Review of Systems  Musculoskeletal:  Positive for gait problem  and neck pain.       Pain on right side:  neck, shoulder, elbow, wrist, hand, fingers, back, buttock, groin, hip, thigh, knee,leg, ankle, toes.     Neurological:  Positive for weakness and numbness.       Spasms, tingling  Psychiatric/Behavioral:         Anxiety  All other systems reviewed and are negative.      Objective:   Physical Exam  General: No acute distress HEENT: NCAT, EOMI, oral membranes moist Cards: reg rate  Chest: normal effort Abdomen: Soft, NT, ND Skin: dry, intact Extremities: no edema Psych: pleasant and appropriate   Neuro: Alert and oriented x 3. Normal insight and awareness. Intact Memory. Normal language and speech. Cranial nerve exam unremarkable. RUE 4+/5 with minimal resting tone.Marland Kitchen RLE 4/5 except for right heel cord tightness. Tone 2+/4 Right gastrocs and tib posterior are tight in addition to EHL, ?TA--I was able to reduce the foot to complete neutral.  Musculoskeletal: minimal pain with ROM,          Assessment & Plan:  1. 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             -we had a long and probably our most earnest discussion about realistic expectations and goals. Son was present and seemed to be supportive of what we discussed. I expressed to her that she can't continue to seek an image of her former self which is not realistic to achieve. She has made SO many gains that I want her to take pride in what she HAS accomplished so far.  2.     HTN:                per primary          3. Spasticity:             -RUE much improved and really her RLE has as well             -still some extensor tone/varus in right heel and foot             -will arrange for further botox to RLE 500 units   -gastroc, tibialis  posterior, EHL, Tib anterior    -tizanidine has been prescribed, PCP filling it currently 4 History of small volume rectal bleeding likely traumatic due to hard stool             - moving bowels regularly             -f/u per primary  5. Neuropathic pain:  -carbamezipine trial was discussed again. Can try using alone after dinner to judge tolerance. She can ultimately take with her other HS meds and then bid  6. Mild intentional tremor:  -likely related to fatigue in stroke-affected RUE  -at this point it appears mild and is not functionally impairing  -recommend observation for now     30+ minutes of face to face patient  care time were spent during this visit. All questions were encouraged and answered.  Follow up with me in about 2 mos botox

## 2022-02-15 ENCOUNTER — Other Ambulatory Visit: Payer: Self-pay | Admitting: Family Medicine

## 2022-02-16 ENCOUNTER — Ambulatory Visit: Payer: Medicare Other | Admitting: Physical Medicine & Rehabilitation

## 2022-02-16 ENCOUNTER — Other Ambulatory Visit: Payer: Self-pay | Admitting: Family Medicine

## 2022-02-17 ENCOUNTER — Ambulatory Visit (INDEPENDENT_AMBULATORY_CARE_PROVIDER_SITE_OTHER): Payer: Medicare Other | Admitting: Family Medicine

## 2022-02-17 ENCOUNTER — Encounter: Payer: Self-pay | Admitting: Family Medicine

## 2022-02-17 VITALS — BP 119/72 | HR 76 | Temp 98.1°F | Wt 121.2 lb

## 2022-02-17 DIAGNOSIS — G894 Chronic pain syndrome: Secondary | ICD-10-CM

## 2022-02-17 DIAGNOSIS — I10 Essential (primary) hypertension: Secondary | ICD-10-CM

## 2022-02-17 MED ORDER — TIZANIDINE HCL 2 MG PO TABS: ORAL_TABLET | ORAL | 0 refills | Status: DC

## 2022-02-17 MED ORDER — VERAPAMIL HCL ER 180 MG PO TBCR
180.0000 mg | EXTENDED_RELEASE_TABLET | Freq: Every day | ORAL | 3 refills | Status: DC
Start: 2022-02-17 — End: 2023-01-03

## 2022-02-17 NOTE — Progress Notes (Signed)
OFFICE VISIT  02/17/2022  CC:  Chief Complaint  Patient presents with   Hypertension   Patient is a 76 y.o. female who presents accompanied by her daughter for 3 mo f/u chronic pain syndrome, HTN, hx of cva with residual deficit.  INTERIM HX: No acute concerns.  Pain in her right hip extending down to her right foot is bothering her a lot still and she only takes minimal doses of Vicodin.  She will occasionally take an ibuprofen tablet. She is afraid to take the carbamazepine that her physical medicine and rehab physician prescribed her.  No home blood pressure monitoring data today  PMP AWARE reviewed today: most recent rx for vicodin 5/325 was filled 01/19/22, # 88, rx by me. Most recent alprazolam 0.'5mg'$  rx filled 10/17/21, #90, rx by me. No red flags.  Past Medical History:  Diagnosis Date   Allergic rhinitis    maple pollen   Anxiety    Arthritis    Bowel obstruction (HCC)    Chronic low back pain    Dr. Trenton Gammon.  Has had back injection--bp elevated after.   Chronic pain syndrome    Chronic renal insufficiency, stage 2 (mild)    GFR 60-70   DDD (degenerative disc disease), cervical    Hx of ACDF (Dr. [name]).  Followed by Dr. [name].  Also, Dr. [name] do left C7-T1 intralaminal epidural injection.   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  Resid R arm and leg weakness + flexion contractures.   Hypercholesteremia    mild; pt declined statin trial 05/2014--needs recheck lipid panel at first f/u visit in 2017   Hypertension    LBBB (left bundle branch block)    Osteopenia 06/2017   T score -1.4 FRAX 15% / 2%   Ovarian cyst 2017   82019-robotic assisted bilatel SPO: all path benign.   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 [name].   Solitary thyroid nodule 05/2021   needs bx->plan endo ref   Syncope 10/2021   cardiac w/u as per Dr. Radford Pax   Trochanteric bursitis of right hip    Recurrent (injection by Dr. 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 [name]). Recurrent fungal/bact OE.   Varicose vein    left leg     Past Surgical History:  Procedure Laterality Date   ABDOMINAL HYSTERECTOMY  03/2015   TAH.  Last pap 2016.  No hx of abnl paps.  Per GYN, no further pap smears indicated.   ANTERIOR AND POSTERIOR REPAIR N/A 03/18/2014   Procedure: Cystocele repair with graft, Vault suspension, Rectocele repair;  Surgeon: [name], MD;  Location: [facility];  Service: Urology;  Laterality: N/A;   BIOPSY THYROID  06/2021   benign   CHOLECYSTECTOMY     CHOLECYSTECTOMY, LAPAROSCOPIC     COLON SURGERY     COLONOSCOPY  04/2006; 05/26/16   2007 (Dr. [name]): Normal.  04/2016 (Dr. [name]) normal except diverticulosis and decreased anal sphincter tone.  No repeat colonoscopy is  recommended due to age.   cspine surgery     Dr. Wiliam Ke level ant cerv discectomy [name] w/plating   CYSTO N/A 03/18/2014   Procedure: CYSTO;  Surgeon: [name], MD;  Location: [facility];  Service: Urology;  Laterality: N/A;   DEXA  07/30/2015; 06/2018   2017 and 2020 -->Osteopenia--repeat 2 yrs. T score -2.29 Mar 2021. Rpt 2 yrs.   LYSIS OF ADHESION N/A 01/31/2018   Procedure: POSSIBLE LYSIS OF ADHESION;  Surgeon: Everitt Amber, MD;  Location: [facility];  Service: Gynecology;  Laterality: N/A;   [place]     with prolasped bledder repair   RESECTION OF COLON     BENIGN TUMOR   RIGHT EAR SURGERY  08/23/2017   Dr. 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  Procedure: XI ROBOTIC ASSISTED BILATERAL SALPINGO OOPHORECTOMY;  Surgeon: Everitt Amber, MD;  Location: [facility];  Service: Gynecology;  Laterality: Bilateral;   TRANSTHORACIC ECHOCARDIOGRAM  01/27/2020   2021 EF 65-70%, NORMAL. 12/01/21 grd I DD, o/w normal.   TUBAL LIGATION      Outpatient Medications Prior to Visit  Medication Sig Dispense Refill   acetaminophen (TYLENOL) 500 MG tablet Take 1 tablet (500 mg total) by mouth 3 (three) times daily. 30 tablet 0   ALPRAZolam (XANAX) 0.5 MG tablet TAKE 1 TABLET BY MOUTH THREE TIMES A DAY AS NEEDED FOR ANXIETY 90 tablet 5   aspirin EC 81 MG tablet Take 1 tablet (81 mg total) by mouth daily. Swallow whole. 30 tablet 11   Biotin 5 MG TABS Take 5 mg by mouth daily.     clobetasol (TEMOVATE) 0.05 % external solution Apply topically.     fluticasone (FLONASE) 50 MCG/ACT nasal spray Place 2 sprays into both nostrils at bedtime. 48 g 3   HYDROcodone-acetaminophen (NORCO/VICODIN) 5-325 MG tablet Take 1 tablet by mouth every 6 (six) hours as needed for moderate pain. 60 tablet 0   ibuprofen (ADVIL) 200 MG tablet Take 200 mg by mouth every 6 (six) hours as needed.     lisinopril (ZESTRIL) 5 MG tablet Take 1 tablet (5 mg total) by mouth daily. 90 tablet 1   loratadine (CLARITIN) 10 MG tablet Take 10 mg by mouth every other day.      meclizine (ANTIVERT) 12.5 MG tablet Take 1 tablet (12.5 mg total) by mouth 3 (three) times daily. 30 tablet 0   Multiple Minerals-Vitamins (CALCIUM-MAGNESIUM-ZINC-D3 PO)  Take 1 tablet by mouth daily.      nystatin ointment (MYCOSTATIN) Apply 1 application topically 2 (two) times daily. Apply to affected area for up to 7 days. 30 g 0   pantoprazole (PROTONIX) 40 MG tablet TAKE 1 TABLET BY MOUTH EVERYDAY AT BEDTIME 90 tablet 1   polyethylene glycol (MIRALAX / GLYCOLAX) 17 g packet Take 17 g by mouth daily as needed.     potassium chloride SA (KLOR-CON M20) 20 MEQ tablet Take 1 tablet (20 mEq total) by mouth daily. 90 tablet 1   Sodium Fluoride (CLINPRO 5000) 1.1 % PSTE Clinpro 5000 1.1 %  dental paste     triamcinolone ointment (KENALOG) 0.1 %      tiZANidine (ZANAFLEX) 2 MG tablet TAKE 1 TABLET BY MOUTH 3 TIMES DAILY AS NEEDED FOR MUSCLE RELAXATION 270 tablet 0   verapamil (CALAN-SR) 180 MG CR tablet TAKE 1 TABLET BY MOUTH EVERY DAY 30 tablet 0   carbamazepine (TEGRETOL-XR) 100 MG 12 hr tablet Take 1 tablet (100 mg total) by mouth 2 (two) times daily. (Patient not taking: Reported on 01/26/2022) 60 tablet 2   No facility-administered medications prior to visit.    Allergies  Allergen Reactions   Gabapentin Anxiety    Elevated heart rate and crazy dreams   Prednisone Anxiety and Other (See Comments)    REACTION: funny feeling    ROS As per HPI  PE:    02/17/2022    2:05 PM 01/26/2022    2:52 PM 01/19/2022    2:35 PM  Vitals with BMI  Height  '5\' 0"'$    Weight 121 lbs 3 oz 120 lbs   BMI 88.41 66.06   Systolic 301 601 093  Diastolic 72 74 80  Pulse 76 74    Physical Exam  Gen: Alert, well appearing.  Patient is oriented to person, place, time, and situation. AFFECT: pleasant, lucid thought and speech. CV: RRR, no murmur EXT: no edema.    LABS:  Last CBC Lab Results  Component Value Date   WBC 7.6 11/02/2021   HGB 12.5 11/02/2021   HCT 35.8 (L) 11/02/2021   MCV 89.9 11/02/2021   MCH 31.4 11/02/2021   RDW 12.4 11/02/2021   PLT 183 23/55/7322   Last metabolic panel Lab Results  Component Value Date   GLUCOSE 125 (H) 11/02/2021   NA 131  (L) 11/02/2021   K 3.5 11/02/2021   CL 99 11/02/2021   CO2 25 11/02/2021   BUN 16 11/02/2021   CREATININE 0.72 11/02/2021   GFRNONAA >60 11/02/2021   CALCIUM 8.8 (L) 11/02/2021   PROT 6.4 (L) 11/02/2021   ALBUMIN 3.7 11/02/2021   BILITOT 0.5 11/02/2021   ALKPHOS 66 11/02/2021   AST 25 11/02/2021   ALT 19 11/02/2021   ANIONGAP 7 11/02/2021   Last lipids Lab Results  Component Value Date   CHOL 168 11/11/2021   HDL 72.90 11/11/2021   LDLCALC 87 11/11/2021   LDLDIRECT 136.9 09/20/2007   TRIG 44.0 11/11/2021   CHOLHDL 2 11/11/2021   Last hemoglobin A1c Lab Results  Component Value Date   HGBA1C 5.0 01/28/2020   Last thyroid functions Lab Results  Component Value Date   TSH 1.08 09/25/2019   T3TOTAL 111 09/25/2019   T4TOTAL 8.1 11/21/2013   IMPRESSION AND PLAN:  #1 hypertension, well controlled on lisinopril 5 mg daily and verapamil SR 180 mg daily. BMET today.   #2 chronic pain syndrome: Chronic right ear pain, chronic neuropathic pain and flexor contraction right lower leg. Continue Vicodin 5-325 1 tab every 6 hours as needed.  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 Continue AFO.  Continue following with Dr. Tessa Lerner. Okay to continue Zanaflex and alprazolam--she has been on both chronically and without side effect.  Does not take these in the daytime  An After Visit Summary was printed and given to the patient.  FOLLOW UP: Return in about 3 months (around 05/20/2022) for routine chronic illness f/u.  Signed:  Crissie Sickles, MD           02/17/2022

## 2022-02-18 LAB — BASIC METABOLIC PANEL
BUN: 15 mg/dL (ref 6–23)
CO2: 27 mEq/L (ref 19–32)
Calcium: 9.8 mg/dL (ref 8.4–10.5)
Chloride: 94 mEq/L — ABNORMAL LOW (ref 96–112)
Creatinine, Ser: 0.82 mg/dL (ref 0.40–1.20)
GFR: 69.01 mL/min (ref >60.00)
Glucose, Bld: 96 mg/dL (ref 70–99)
Potassium: 4.8 mEq/L (ref 3.5–5.1)
Sodium: 130 mEq/L — ABNORMAL LOW (ref 135–145)

## 2022-02-21 ENCOUNTER — Telehealth: Payer: Self-pay

## 2022-02-21 ENCOUNTER — Other Ambulatory Visit: Payer: Self-pay | Admitting: Family Medicine

## 2022-02-21 NOTE — Telephone Encounter (Signed)
Patient advised of results.

## 2022-02-21 NOTE — Telephone Encounter (Signed)
Patient returning call about lab results.  Please call patient back when available.  

## 2022-02-22 ENCOUNTER — Telehealth: Payer: Self-pay

## 2022-02-22 DIAGNOSIS — L602 Onychogryphosis: Secondary | ICD-10-CM

## 2022-02-22 NOTE — Telephone Encounter (Signed)
Pt advised refill sent. °

## 2022-02-22 NOTE — Telephone Encounter (Signed)
Requesting: alprazolam Contract: 12/19/17 UDS: N/A Last Visit: 02/17/22 Next Visit: 05/26/22 Last Refill:  05/13/21(90,5)  Please Advise. Med pending

## 2022-02-22 NOTE — Telephone Encounter (Signed)
Pt would like to know the name of a podiatrist near [place] area. She does not want to go to [place].  Please review and advise

## 2022-02-23 ENCOUNTER — Ambulatory Visit: Payer: Medicare Other | Admitting: Family Medicine

## 2022-02-23 NOTE — Addendum Note (Signed)
Addended by: Deveron Furlong D on: 02/23/2022 09:26 AM   Modules accepted: Orders

## 2022-02-23 NOTE — Telephone Encounter (Signed)
I do not know the name of any podiatrist in that area but I am sure there is 1. Please put in referral for podiatry for diagnosis of onychogryposis and specify in comments the geographic location she prefers and Venida Jarvis will look into it.-thx

## 2022-02-23 NOTE — Telephone Encounter (Signed)
Patient advised of recommendations. Referral placed

## 2022-03-01 ENCOUNTER — Telehealth: Payer: Self-pay | Admitting: Family Medicine

## 2022-03-01 NOTE — Telephone Encounter (Signed)
Left detailed message advising last flu vaccine was 05/06/21.

## 2022-03-01 NOTE — Telephone Encounter (Signed)
Pt would like to know the date of her last flu vaccine. She reports that the drug store has called her to notify her its time for one. Please call Alison Cuevas back and she gave the ok to leave a detail message on her VM.

## 2022-03-08 ENCOUNTER — Telehealth: Payer: Self-pay | Admitting: Family Medicine

## 2022-03-08 NOTE — Telephone Encounter (Signed)
Chart updated

## 2022-03-08 NOTE — Telephone Encounter (Signed)
Pt called to inform us that she did receive a flu shot today at [facility], and she would like [place] to add it to her records.

## 2022-03-15 DIAGNOSIS — H90A22 Sensorineural hearing loss, unilateral, left ear, with restricted hearing on the contralateral side: Secondary | ICD-10-CM | POA: Diagnosis not present

## 2022-03-15 DIAGNOSIS — H7291 Unspecified perforation of tympanic membrane, right ear: Secondary | ICD-10-CM | POA: Diagnosis not present

## 2022-03-15 DIAGNOSIS — H90A31 Mixed conductive and sensorineural hearing loss, unilateral, right ear with restricted hearing on the contralateral side: Secondary | ICD-10-CM | POA: Diagnosis not present

## 2022-03-15 DIAGNOSIS — H9042 Sensorineural hearing loss, unilateral, left ear, with unrestricted hearing on the contralateral side: Secondary | ICD-10-CM | POA: Diagnosis not present

## 2022-03-15 DIAGNOSIS — Z9889 Other specified postprocedural states: Secondary | ICD-10-CM | POA: Diagnosis not present

## 2022-03-17 DIAGNOSIS — I739 Peripheral vascular disease, unspecified: Secondary | ICD-10-CM | POA: Diagnosis not present

## 2022-03-17 DIAGNOSIS — L110 Acquired keratosis follicularis: Secondary | ICD-10-CM | POA: Diagnosis not present

## 2022-03-17 DIAGNOSIS — M79675 Pain in left toe(s): Secondary | ICD-10-CM | POA: Diagnosis not present

## 2022-03-17 DIAGNOSIS — M79674 Pain in right toe(s): Secondary | ICD-10-CM | POA: Diagnosis not present

## 2022-03-17 DIAGNOSIS — M79671 Pain in right foot: Secondary | ICD-10-CM | POA: Diagnosis not present

## 2022-03-17 DIAGNOSIS — M79672 Pain in left foot: Secondary | ICD-10-CM | POA: Diagnosis not present

## 2022-04-04 ENCOUNTER — Ambulatory Visit: Payer: Medicare Other | Admitting: Cardiology

## 2022-04-13 ENCOUNTER — Encounter: Payer: Self-pay | Admitting: Physical Medicine & Rehabilitation

## 2022-04-13 ENCOUNTER — Encounter: Payer: Medicare Other | Attending: Physical Medicine & Rehabilitation | Admitting: Physical Medicine & Rehabilitation

## 2022-04-13 VITALS — BP 144/76 | HR 67 | Ht 60.0 in | Wt 121.2 lb

## 2022-04-13 DIAGNOSIS — G8111 Spastic hemiplegia affecting right dominant side: Secondary | ICD-10-CM | POA: Diagnosis not present

## 2022-04-13 MED ORDER — SODIUM CHLORIDE (PF) 0.9 % IJ SOLN
5.0000 mL | Freq: Once | INTRAMUSCULAR | Status: AC
  Administered 2022-04-13: 5 mL

## 2022-04-13 MED ORDER — ONABOTULINUMTOXINA 100 UNITS IJ SOLR
500.0000 [IU] | Freq: Once | INTRAMUSCULAR | Status: AC
  Administered 2022-04-13: 500 [IU] via INTRAMUSCULAR

## 2022-04-13 NOTE — Patient Instructions (Signed)
ALWAYS FEEL FREE TO CALL OUR OFFICE WITH ANY PROBLEMS OR QUESTIONS ([phone])  **PLEASE NOTE** ALL MEDICATION REFILL REQUESTS (INCLUDING CONTROLLED SUBSTANCES) NEED TO BE MADE AT LEAST 7 DAYS PRIOR TO REFILL BEING DUE. ANY REFILL REQUESTS INSIDE THAT TIME FRAME MAY RESULT IN DELAYS IN RECEIVING YOUR PRESCRIPTION.                    

## 2022-04-13 NOTE — Progress Notes (Signed)
Botox Injection for spasticity of lower extremity using needle EMG guidance Indication: Right spastic hemiparesis (HCC) G81.11  Dilution: 100 Units/ml        Total Units Injected: 500 Indication: Severe spasticity which interferes with ADL,mobility and/or  hygiene and is unresponsive to medication management and other conservative care Informed consent was obtained after describing risks and benefits of the procedure with the patient. This includes bleeding, bruising, infection, excessive weakness, or medication side effects. A REMS form is on file and signed.  Needle: 16m injectable monopolar needle electrode   right Number of units per muscle  Quadriceps 0 units Gastroc/soleus 275 units Hamstrings 0 units Tibialis Posterior 150 units Tibialis Anterior 75 units EHL 0 units All injections were done after obtaining appropriate EMG activity and after negative drawback for blood. The patient tolerated the procedure well. Post procedure instructions were given. Return in about 3 months (around 07/14/2022) for for reassessment.

## 2022-04-29 NOTE — Progress Notes (Deleted)
Cardiology Office Note:    Date:  04/29/2022   ID:  Alison Cuevas, DOB 05-26-1946, MRN [id number]  PCP:  [name], MD  [place] Providers Cardiologist:  None { Click to update primary MD,subspecialty MD or APP then REFRESH:1}  *** Referring MD: [name], MD   Chief Complaint:  No chief complaint on file. {Click here for Visit Info    :1}    History of Present Illness:   Alison Cuevas is a 76 y.o. female      with a hx of remote CVA, HTN, CKD stage 2, HLD who Dr. Radford Pax saw for evaluation recent syncope 10/2021.  Apparently she was sitting in her wheelchair eating lunch and talking and had sudden onset of lightheadedness and then had syncope.  She was having pain in her arm in leg from her CVA.  This was witnessed by her family.  There was no witnessed seizure activity.  She was unresponsive for about 40 minutes and then EMS arrived and gave her Zofran and then she had nausea and vomiting.   She was given IV fluids with improvement in her symptoms.  Troponin was negative x2 and labs were essentially normal.  EKG showed normal sinus rhythm with no acute changes.  It was felt that she had vasovagal syncope.  She is now referred for further follow-up.   Apparently she has had syncope with GI upset and on the toilet in the past with last episode 4 years ago.   Syncope was felt to be vasovagal, if recurrence consider loop recorder. She was not orthostatic. Echo normal LVEF, G1DD. Monitor and lexi ordered but not done.  Past Medical History:  Diagnosis Date   Allergic rhinitis    maple pollen   Anxiety    Arthritis    Bowel obstruction (HCC)    Chronic low back pain    Dr. Trenton Gammon.  Has had back injection--bp elevated after.   Chronic pain syndrome    Chronic renal insufficiency, stage 2 (mild)    GFR 60-70   DDD (degenerative disc disease), cervical    Hx of ACDF (Dr. [name]).  Followed by Dr. [name].  Also, Dr. [name] do left C7-T1  intralaminal epidural injection.   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  Resid R arm and leg weakness + flexion contractures.   Hypercholesteremia    mild; pt declined statin trial 05/2014--needs recheck lipid panel at first f/u visit in 2017   Hypertension    LBBB (left bundle branch block)    Osteopenia 06/2017   T score -1.4 FRAX 15% / 2%   Ovarian cyst 2017   82019-robotic assisted bilatel SPO: all path benign.   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 [name].   Solitary thyroid nodule 05/2021   needs bx->plan endo ref   Syncope 10/2021   cardiac w/u as per Dr. Radford Pax   Syncope    2023 (vasovagal suspected)   Trochanteric bursitis of right hip    Recurrent (injection by Dr. [name] 05/26/15)   Tympanic membrane rupture, right 12/01/2016  As of 08/2018 pt set for tympanomastoidectomy and STSG (Dr. [name]). Recurrent fungal/bact OE.   Varicose vein    left leg    Current Medications: No outpatient medications have been marked as taking for the 05/10/22 encounter (Appointment) with Imogene Burn, PA-C.    Allergies:   Gabapentin and Prednisone   Social History   Tobacco Use   Smoking status: Never   Smokeless tobacco: Never  Vaping Use   Vaping Use: Never used  Substance Use Topics   Alcohol use: No    Alcohol/week: 0.0 standard drinks of alcohol   Drug use: No    Family Hx: The patient's family history includes Breast cancer (age of onset: 56) in her sister; Diverticulitis in her mother; Heart attack in her father; Heart failure in her father; Hypertension in her mother; Leukemia in  her brother; Lung disease in her brother; Lupus in her sister; Melanoma in her brother; Other in an other family member; Prostate cancer in her brother; Stomach cancer in her father; Thyroid disease in her mother.  ROS     Physical Exam:    VS:  LMP 06/27/1972     Wt Readings from Last 3 Encounters:  04/13/22 121 lb 3.2 oz (55 kg)  02/17/22 121 lb 3.2 oz (55 kg)  01/26/22 120 lb (54.4 kg)    Physical Exam  GEN: Well nourished, well developed, in no acute distress  HEENT: normal  Neck: no JVD, carotid bruits, or masses Cardiac:RRR; no murmurs, rubs, or gallops  Respiratory:  clear to auscultation bilaterally, normal work of breathing GI: soft, nontender, nondistended, + BS Ext: without cyanosis, clubbing, or edema, Good distal pulses bilaterally MS: no deformity or atrophy  Skin: warm and dry, no rash Neuro:  Alert and Oriented x 3, Strength and sensation are intact Psych: euthymic mood, full affect        EKGs/Labs/Other Test Reviewed:    EKG:  EKG is *** ordered today.  The ekg ordered today demonstrates ***  Recent Labs: 11/02/2021: ALT 19; Hemoglobin 12.5; Platelets 183 02/17/2022: BUN 15; Creatinine, Ser 0.82; Potassium 4.8; Sodium 130   Recent Lipid Panel Recent Labs    11/11/21 1341  CHOL 168  TRIG 44.0  HDL 72.90  VLDL 8.8  LDLCALC 87     Prior CV Studies: {Select studies to display:26339}  Echo 11/2021 IMPRESSIONS     1. Abnormal septal motion . Left ventricular ejection fraction, by  estimation, is 55 to 60%. The left ventricle has normal function. The left  ventricle has no regional wall motion abnormalities. There is mild  asymmetric left ventricular hypertrophy of  the basal and septal segments. Left ventricular diastolic parameters are  consistent with Grade I diastolic dysfunction (impaired relaxation).   2. Right ventricular systolic function is normal. The right ventricular  size is normal.   3. The mitral valve is abnormal. Trivial mitral  valve regurgitation. No  evidence of mitral stenosis.   4. The aortic valve is tricuspid. There is mild calcification of the  aortic valve. Aortic valve regurgitation is not visualized. Aortic valve  sclerosis is present, with no evidence of aortic valve stenosis.   5. The inferior vena cava is normal in size with greater than 50%  respiratory variability, suggesting right atrial pressure of 3 mmHg.     Risk Assessment/Calculations/Metrics:   {Does this patient have ATRIAL FIBRILLATION?[phone]}     No BP recorded.  {Refresh Note OR Click here to enter BP  :1}***  ASSESSMENT & PLAN:   No problem-specific Assessment & Plan notes found for this encounter.   Syncope felt to be vasovagal in the past. Echo with normal LVEF, G1DD. Monitor and Lexi ordered but not done  HTN  HLD  History of CVA  CKD      {Are you ordering a CV Procedure (e.g. stress test, cath, DCCV, TEE, etc)?   Press F2        :[id number]}   Dispo:  No follow-ups on file.   Medication Adjustments/Labs and Tests Ordered: Current medicines are reviewed at length with the patient today.  Concerns regarding medicines are outlined above.  Tests Ordered: No orders of the defined types were placed in this encounter.  Medication Changes: No orders of the defined types were placed in this encounter.  Signed, Ermalinda Barrios, PA-C  04/29/2022 9:31 AM    [facility] [address], [place], [place]  [address] Phone: [phone]; Fax: [phone]

## 2022-05-10 ENCOUNTER — Ambulatory Visit: Payer: Medicare Other | Admitting: Physician Assistant

## 2022-05-12 ENCOUNTER — Encounter: Payer: Self-pay | Admitting: *Deleted

## 2022-05-12 ENCOUNTER — Telehealth: Payer: Self-pay | Admitting: *Deleted

## 2022-05-12 NOTE — Patient Instructions (Signed)
Visit Information  Thank you for taking time to visit with me today. Please don't hesitate to contact me if I can be of assistance to you.   Following are the goals we discussed today:   Goals Addressed               This Visit's Progress     COMPLETED: issues with hospital bed ([facility]) (pt-stated)        Care Coordination Interventions: Reviewed medications with patient and discussed adherences with no needed refills Collaborated with [facility] regarding pt's request for a replacement hospital bed with no added expense to pt. Reviewed scheduled/upcoming provider appointments including pending appointments with sufficient transportation Screening for signs and symptoms of depression related to chronic disease state  Assessed social determinant of health barriers Provided permission to contact [facility] where to bed was obtained from 2 years ago for a better/improved bed. ** [facility] provided a contact number for the intake dept [phone] and they will provide the instructions of what is needed to process the pt's request. Pt has been updated with this information. No additional needs.         Please call the care guide team at [phone] if you need to cancel or reschedule your appointment.   If you are experiencing a Mental Health or [place] or need someone to talk to, please call the Suicide and Crisis Lifeline: 988  Patient verbalizes understanding of instructions and care plan provided today and agrees to view in [place]. Active MyChart status and patient understanding of how to access instructions and care plan via MyChart confirmed with patient.     No further follow up required: No further needs at this time.   Raina Mina, RN Care Management Coordinator [place] Office [phone]

## 2022-05-12 NOTE — Patient Outreach (Signed)
  Care Coordination   Initial Visit Note   05/12/2022 Name: Alison Cuevas MRN: [id number] DOB: [birth year]  Alison Cuevas is a 76 y.o. year old female who sees McGowen, Adrian Blackwater, MD for primary care. I spoke with  [name] by phone today.  What matters to the patients health and wellness today?  Need a replacement on her hospital bed from [place]               This Visit's Progress     COMPLETED: issues with hospital bed ([facility]) (pt-stated)        Care Coordination Interventions: Reviewed medications with patient and discussed adherences with no needed refills Collaborated with [facility] regarding pt's request for a replacement hospital bed with no added expense to pt. Reviewed scheduled/upcoming provider appointments including pending appointments with sufficient transportation Screening for signs and symptoms of depression related to chronic disease state  Assessed social determinant of health barriers Provided permission to contact [facility] where to bed was obtained from 2 years ago for a better/improved bed. ** [facility] provided a contact number for the intake dept [phone] and they will provide the instructions of what is needed to process the pt's request. Pt has been updated with this information. No additional needs.         SDOH assessments and interventions completed:  Yes  SDOH Interventions Today    Flowsheet Row Most Recent Value  SDOH Interventions   Food Insecurity Interventions Intervention Not Indicated  Housing Interventions Intervention Not Indicated  Transportation Interventions Intervention Not Indicated  Utilities Interventions Intervention Not Indicated        Care Coordination Interventions Activated:  Yes  Care Coordination Interventions:  Yes, provided   Follow up plan: No further intervention required.   Encounter Outcome:  Pt. Visit Completed   Raina Mina, RN Care Management Coordinator [place] Office [phone]

## 2022-05-17 ENCOUNTER — Other Ambulatory Visit: Payer: Self-pay | Admitting: Family Medicine

## 2022-05-17 DIAGNOSIS — H90A31 Mixed conductive and sensorineural hearing loss, unilateral, right ear with restricted hearing on the contralateral side: Secondary | ICD-10-CM | POA: Diagnosis not present

## 2022-05-17 DIAGNOSIS — H90A22 Sensorineural hearing loss, unilateral, left ear, with restricted hearing on the contralateral side: Secondary | ICD-10-CM | POA: Diagnosis not present

## 2022-05-17 DIAGNOSIS — Z9889 Other specified postprocedural states: Secondary | ICD-10-CM | POA: Diagnosis not present

## 2022-05-17 DIAGNOSIS — H6121 Impacted cerumen, right ear: Secondary | ICD-10-CM | POA: Diagnosis not present

## 2022-05-17 DIAGNOSIS — H7291 Unspecified perforation of tympanic membrane, right ear: Secondary | ICD-10-CM | POA: Diagnosis not present

## 2022-05-18 ENCOUNTER — Other Ambulatory Visit: Payer: Self-pay | Admitting: Family Medicine

## 2022-05-25 ENCOUNTER — Ambulatory Visit: Payer: Medicare Other | Admitting: Family Medicine

## 2022-05-26 ENCOUNTER — Ambulatory Visit: Payer: Medicare Other | Admitting: Family Medicine

## 2022-05-28 ENCOUNTER — Encounter: Payer: Self-pay | Admitting: Physical Medicine & Rehabilitation

## 2022-06-01 ENCOUNTER — Telehealth: Payer: Self-pay

## 2022-06-01 NOTE — Telephone Encounter (Signed)
Requesting: alprazolam Contract: 03/06/19 Lebron Quam) UDS: 04/03/19 Last Visit: 02/17/22 Next Visit: 06/07/22 Last Refill: 02/22/22 (90,5)   She will check with the pharmacy and let us know if refill still needed.

## 2022-06-01 NOTE — Telephone Encounter (Signed)
Patient refill request --- patient aware Dr. Anitra Lauth is out of office today. [facility] [place]  ALPRAZolam (XANAX) 0.5 MG tablet [[id number]]

## 2022-06-02 ENCOUNTER — Other Ambulatory Visit: Payer: Self-pay | Admitting: Family Medicine

## 2022-06-07 ENCOUNTER — Encounter: Payer: Self-pay | Admitting: Family Medicine

## 2022-06-07 ENCOUNTER — Ambulatory Visit (INDEPENDENT_AMBULATORY_CARE_PROVIDER_SITE_OTHER): Payer: Medicare Other | Admitting: Family Medicine

## 2022-06-07 VITALS — BP 121/73 | HR 94 | Temp 97.5°F | Ht 60.0 in | Wt 119.8 lb

## 2022-06-07 DIAGNOSIS — I6930 Unspecified sequelae of cerebral infarction: Secondary | ICD-10-CM | POA: Diagnosis not present

## 2022-06-07 DIAGNOSIS — G894 Chronic pain syndrome: Secondary | ICD-10-CM | POA: Diagnosis not present

## 2022-06-07 DIAGNOSIS — M24561 Contracture, right knee: Secondary | ICD-10-CM

## 2022-06-07 DIAGNOSIS — M79604 Pain in right leg: Secondary | ICD-10-CM

## 2022-06-07 DIAGNOSIS — I10 Essential (primary) hypertension: Secondary | ICD-10-CM

## 2022-06-07 LAB — BASIC METABOLIC PANEL
BUN: 13 mg/dL (ref 6–23)
CO2: 27 mEq/L (ref 19–32)
Calcium: 9.9 mg/dL (ref 8.4–10.5)
Chloride: 98 mEq/L (ref 96–112)
Creatinine, Ser: 0.74 mg/dL (ref 0.40–1.20)
GFR: 77.89 mL/min (ref >60.00)
Glucose, Bld: 111 mg/dL — ABNORMAL HIGH (ref 70–99)
Potassium: 4.7 mEq/L (ref 3.5–5.1)
Sodium: 134 mEq/L — ABNORMAL LOW (ref 135–145)

## 2022-06-07 NOTE — Progress Notes (Signed)
OFFICE VISIT  06/07/2022  CC:  Chief Complaint  Patient presents with   Hypertension    Patient is a 76 y.o. female who presents for 3 mo f/u chronic pain syndrome, HTN, hx of cva with residual deficit. A/P as of last visit: "1 hypertension, well controlled on lisinopril 5 mg daily and verapamil SR 180 mg daily. BMET today.   #2 chronic pain syndrome: Chronic right ear pain, chronic neuropathic pain and flexor contraction right lower leg. Continue Vicodin 5-325 1 tab every 6 hours as needed.   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 Continue AFO.  Continue following with Dr. Tessa Lerner. Okay to continue Zanaflex and alprazolam--she has been on both chronically and without side effect.  Does not take these in the daytime"  INTERIM HX: Pain control adequate with as needed use of hydrocodone. Right flexion contracture still her main focus of pain, although her right hip has gradually felt more pain as result of alterations in mechanical stress.  Anxiety level stable with use of alprazolam.  She takes Zanaflex sometimes but never combines this with alprazolam.  She does have occasional brief episode of dizziness, primarily postural  PMP AWARE reviewed today: most recent rx for Vicodin was filled 01/19/2022, # 62, rx by me. Most recent alprazolam prescription filled 02/22/2022, #90, prescription by me. No red flags.  ROS as above, plus--> no fevers, no CP, no SOB, no wheezing, no cough, no HAs, no rashes, no melena/hematochezia.  No polyuria or polydipsia.  No focal weakness, paresthesias, or tremors.  No acute vision or hearing abnormalities.  No dysuria or unusual/new urinary urgency or frequency.  No recent changes in lower legs. No n/v/d or abd pain.  No palpitations.     Past Medical History:  Diagnosis Date   Allergic rhinitis    maple pollen   Anxiety    Arthritis    Bowel obstruction (HCC)    Chronic low back pain    Dr. Trenton Gammon.  Has  had back injection--bp elevated after.   Chronic pain syndrome    Chronic renal insufficiency, stage 2 (mild)    GFR 60-70   DDD (degenerative disc disease), cervical    Hx of ACDF (Dr. [name]).  Followed by Dr. [name].  Also, Dr. [name] do left C7-T1 intralaminal epidural injection.   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  Resid R arm and leg weakness + flexion contractures.   Hypercholesteremia    mild; pt declined statin trial 05/2014--needs recheck lipid panel at first f/u visit in 2017   Hypertension    LBBB (left bundle branch block)    Osteopenia 06/2017   T score -1.4 FRAX 15% / 2%   Ovarian cyst 2017   82019-robotic assisted bilatel SPO: all path benign.   Perianal dermatitis    prn cutivate   Phlebitis    Right hemiplegia ([place]) 01/2020   R arm and leg (hemorrhagic L cerebral hemisphere CVA)   Right knee injury 2018   Patellofemoral crush injury--Dr. [name].   Solitary thyroid nodule 05/2021   needs bx->plan endo ref   Syncope 10/2021   cardiac w/u as per Dr. Radford Pax   Syncope    2023 (vasovagal suspected)   Trochanteric bursitis of right  hip    Recurrent (injection by Dr. 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 [name]). Recurrent fungal/bact OE.   Varicose vein    left leg     Past Surgical History:  Procedure Laterality Date   ABDOMINAL HYSTERECTOMY  03/2015   TAH.  Last pap 2016.  No hx of abnl paps.  Per GYN, no further pap smears indicated.   ANTERIOR AND POSTERIOR REPAIR N/A 03/18/2014   Procedure: Cystocele repair with graft, Vault suspension, Rectocele repair;  Surgeon: [name], MD;  Location: [facility];  Service:  Urology;  Laterality: N/A;   BIOPSY THYROID  06/2021   benign   CHOLECYSTECTOMY     CHOLECYSTECTOMY, LAPAROSCOPIC     COLON SURGERY     COLONOSCOPY  04/2006; 05/26/16   2007 (Dr. [name]): Normal.  04/2016 (Dr. [name]) normal except diverticulosis and decreased anal sphincter tone.  No repeat colonoscopy is recommended due to age.   cspine surgery     Dr. Wiliam Ke level ant cerv discectomy [name] w/plating   CYSTO N/A 03/18/2014   Procedure: CYSTO;  Surgeon: [name], MD;  Location: [facility];  Service: Urology;  Laterality: N/A;   DEXA  07/30/2015; 06/2018   2017 and 2020 -->Osteopenia--repeat 2 yrs. T score -2.29 Mar 2021. Rpt 2 yrs.   LYSIS OF ADHESION N/A 01/31/2018   Procedure: POSSIBLE LYSIS OF ADHESION;  Surgeon: Everitt Amber, MD;  Location: [facility];  Service: Gynecology;  Laterality: N/A;   [place]     with prolasped bledder repair   RESECTION OF COLON     BENIGN TUMOR   RIGHT EAR SURGERY  08/23/2017   Dr. 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  Procedure: XI ROBOTIC ASSISTED BILATERAL SALPINGO OOPHORECTOMY;  Surgeon: Everitt Amber, MD;  Location: [facility];  Service: Gynecology;  Laterality: Bilateral;   TRANSTHORACIC ECHOCARDIOGRAM  01/27/2020   2021 EF 65-70%, NORMAL. 12/01/21 grd I DD, o/w normal.   TUBAL LIGATION      Outpatient Medications Prior to Visit  Medication Sig Dispense Refill   acetaminophen (TYLENOL) 500 MG tablet Take 1 tablet (500 mg total) by mouth 3 (three)  times daily. 30 tablet 0   ALPRAZolam (XANAX) 0.5 MG tablet TAKE 1 TABLET BY MOUTH THREE TIMES A DAY AS NEEDED FOR ANXIETY 90 tablet 5   aspirin EC 81 MG tablet Take 1 tablet (81 mg total) by mouth daily. Swallow whole. 30 tablet 11   Biotin 5 MG TABS Take 5 mg by mouth daily.     clobetasol (TEMOVATE) 0.05 % external solution Apply topically.     fluticasone (FLONASE) 50 MCG/ACT nasal spray Place 2 sprays into both nostrils at bedtime. 48 g 3   lisinopril (ZESTRIL) 5 MG tablet TAKE 1 TABLET (5 MG TOTAL) BY MOUTH DAILY. 90 tablet 0   loratadine (CLARITIN) 10 MG tablet Take 10 mg by mouth every other day.      meclizine (ANTIVERT) 12.5 MG tablet Take 1  tablet (12.5 mg total) by mouth 3 (three) times daily. 30 tablet 0   Multiple Minerals-Vitamins (CALCIUM-MAGNESIUM-ZINC-D3 PO) Take 1 tablet by mouth daily.      nystatin ointment (MYCOSTATIN) Apply 1 application topically 2 (two) times daily. Apply to affected area for up to 7 days. 30 g 0   pantoprazole (PROTONIX) 40 MG tablet TAKE 1 TABLET BY MOUTH EVERYDAY AT BEDTIME 90 tablet 1   polyethylene glycol (MIRALAX / GLYCOLAX) 17 g packet Take 17 g by mouth daily as needed.     potassium chloride SA (KLOR-CON M20) 20 MEQ tablet TAKE 1 TABLET BY MOUTH EVERY DAY 90 tablet 0   Sodium Fluoride (CLINPRO 5000) 1.1 % PSTE Clinpro 5000 1.1 % dental paste     tiZANidine (ZANAFLEX) 2 MG tablet TAKE 1 TABLET BY MOUTH 3 TIMES DAILY AS NEEDED FOR MUSCLE RELAXATION 90 tablet 0   triamcinolone ointment (KENALOG) 0.1 %      verapamil (CALAN-SR) 180 MG CR tablet Take 1 tablet (180 mg total) by mouth daily. 90 tablet 3   HYDROcodone-acetaminophen (NORCO/VICODIN) 5-325 MG tablet Take 1 tablet by mouth every 6 (six) hours as needed for moderate pain. (Patient not taking: Reported on 06/07/2022) 60 tablet 0   ibuprofen (ADVIL) 200 MG tablet Take 200 mg by mouth every 6 (six) hours as needed. (Patient not taking: Reported on 06/07/2022)     No facility-administered  medications prior to visit.    Allergies  Allergen Reactions   Gabapentin Anxiety    Elevated heart rate and crazy dreams   Prednisone Anxiety and Other (See Comments)    REACTION: funny feeling    ROS As per HPI  PE:    06/07/2022   10:24 AM 04/13/2022    3:31 PM 02/17/2022    2:05 PM  Vitals with BMI  Height '5\' 0"'$  '5\' 0"'$    Weight 119 lbs 13 oz 121 lbs 3 oz 121 lbs 3 oz  BMI 23.4 23.53 61.44  Systolic 315 400 867  Diastolic 73 76 72  Pulse 94 67 76     Physical Exam  Gen: Alert, well appearing.  Patient is oriented to person, place, time, and situation. AFFECT: pleasant, lucid thought and speech. CV: RRR, no m/r/g  LABS:  Last CBC Lab Results  Component Value Date   WBC 7.6 11/02/2021   HGB 12.5 11/02/2021   HCT 35.8 (L) 11/02/2021   MCV 89.9 11/02/2021   MCH 31.4 11/02/2021   RDW 12.4 11/02/2021   PLT 183 61/95/0932   Last metabolic panel Lab Results  Component Value Date   GLUCOSE 96 02/17/2022   NA 130 (L) 02/17/2022   K 4.8 02/17/2022   CL 94 (L) 02/17/2022   CO2 27 02/17/2022   BUN 15 02/17/2022   CREATININE 0.82 02/17/2022   GFRNONAA >60 11/02/2021   CALCIUM 9.8 02/17/2022   PROT 6.4 (L) 11/02/2021   ALBUMIN 3.7 11/02/2021   BILITOT 0.5 11/02/2021   ALKPHOS 66 11/02/2021   AST 25 11/02/2021   ALT 19 11/02/2021   ANIONGAP 7 11/02/2021   Last lipids Lab Results  Component Value Date   CHOL 168 11/11/2021   HDL 72.90 11/11/2021   LDLCALC 87 11/11/2021   LDLDIRECT 136.9 09/20/2007   TRIG 44.0 11/11/2021   CHOLHDL 2 11/11/2021   Last hemoglobin A1c Lab Results  Component Value Date   HGBA1C 5.0 01/28/2020   Last thyroid functions Lab Results  Component Value Date   TSH 1.08 09/25/2019  T3TOTAL 111 09/25/2019   T4TOTAL 8.1 11/21/2013  Last vitamin D Lab Results  Component Value Date   VD25OH 85.58 05/06/2021   IMPRESSION AND PLAN:  #1 chronic pain syndrome: Right leg flexion contracture status post CVA.  This is likely  reached its maximum improvement.  She will continue with AFO and periodic follow-up with Dr. Tessa Lerner, next appointment in January 2024. Continue Vicodin 5-3 25, 1 every 6 hours as needed. She uses a walker. Next DEXA due 03/2023.  #2 hypertension, well-controlled on lisinopril 5 mg a day. Electrolytes and creatinine today.  3.  Chronic hyponatremia. Electrolytes and creatinine today.  An After Visit Summary was printed and given to the patient.  FOLLOW UP: Return in about 3 months (around 09/06/2022) for routine chronic illness f/u.  Signed:  Crissie Sickles, MD           06/07/2022

## 2022-06-11 ENCOUNTER — Other Ambulatory Visit: Payer: Self-pay | Admitting: Family Medicine

## 2022-06-13 NOTE — Telephone Encounter (Signed)
Pt is requesting a 90 supply

## 2022-07-01 ENCOUNTER — Telehealth: Payer: Self-pay

## 2022-07-01 NOTE — Telephone Encounter (Signed)
Type of forms received: [employer]  Routed to:  [employer] received by :  Hinton Dyer   Individual made aware of 5-7 business day turn around (Y/N): Y  Form completed and patient made aware of charges(Y/N): N/A

## 2022-07-01 NOTE — Telephone Encounter (Signed)
Pt scheduled  

## 2022-07-04 NOTE — Telephone Encounter (Signed)
Signed and put in box to go up front. Signed:  Crissie Sickles, MD           07/04/2022

## 2022-07-04 NOTE — Telephone Encounter (Signed)
Patient son, Juanda Crumble "Bud", aware form was faxed to [employer] today by [employer].    Forms sent to HIM to be scanned in chart.

## 2022-07-04 NOTE — Telephone Encounter (Signed)
Placed on PCP desk to review and sign, if appropriate.  

## 2022-07-13 ENCOUNTER — Encounter: Payer: Medicare Other | Attending: Physical Medicine & Rehabilitation | Admitting: Physical Medicine & Rehabilitation

## 2022-07-13 ENCOUNTER — Encounter: Payer: Self-pay | Admitting: Physical Medicine & Rehabilitation

## 2022-07-13 VITALS — BP 134/79 | HR 80 | Ht 60.0 in | Wt 121.0 lb

## 2022-07-13 DIAGNOSIS — I611 Nontraumatic intracerebral hemorrhage in hemisphere, cortical: Secondary | ICD-10-CM | POA: Diagnosis not present

## 2022-07-13 DIAGNOSIS — G8111 Spastic hemiplegia affecting right dominant side: Secondary | ICD-10-CM

## 2022-07-13 NOTE — Progress Notes (Signed)
Subjective:    Patient ID: Alison Cuevas, female    DOB: 1945-12-18, 77 y.o.   MRN: [id number]  HPI Alison Cuevas is here in follow up of her left ICH. In October we injected her right gastrocs and nearby musculature. She feels that the botox provided some benefit for a couple months. Her son is not sure that the botox is helping at this point.   She is using tizanidine for spasms with some benefit. (The pt was not consistent with her history.) She seems to tolerate the tizanidine fairly.   Alison Cuevas's son wants to bring her home with him. Alison Cuevas wants to live on her own. She has help a couple days a week. Son drives 59 miles to check on her when he can. A sister might be able to help but that hasn't been established  She was given an rx for tegretol (for neuropathic pain RLE) which she filled but never took.   She continues with her AFO. She stretches most days. Son remains very involved in her care.   Pain Inventory Average Pain 3 Pain Right Now 5 My pain is constant, sharp, burning, dull, tingling, and aching  LOCATION OF PAIN  neck, shoulders, elbow, wrist, hand, fingers, back, buttocks, groin, hip, knee, thigh, leg, ankle: on the rightside  BOWEL Number of stools per week: 7 Oral laxative use Yes  Type of laxative miralax  BLADDER Pads  Bladder incontinence Yes     Mobility use a walker ability to climb steps?  no do you drive?  no use a wheelchair transfers alone Do you have any goals in this area?  yes  Function I need assistance with the following:  bathing, household duties, and shopping Do you have any goals in this area?  yes  Neuro/Psych bowel control problems numbness tremor trouble walking spasms dizziness  Prior Studies Any changes since last visit?  no  Physicians involved in your care Any changes since last visit?  no   Family History  Problem Relation Age of Onset   Thyroid disease Mother    Hypertension Mother    Diverticulitis Mother    Stomach  cancer Father    Heart attack Father    Heart failure Father    Breast cancer Sister 51   Lupus Sister    Melanoma Brother    Prostate cancer Brother    Leukemia Brother    Lung disease Brother    Other Other        Family member with MGUS   Social History   Socioeconomic History   Marital status: Widowed    Spouse name: Alison Cuevas   Number of children: 3   Years of education: Not on file   Highest education level: 8th grade  Occupational History   Occupation: retired  Tobacco Use   Smoking status: Never   Smokeless tobacco: Never  Vaping Use   Vaping Use: Never used  Substance and Sexual Activity   Alcohol use: No    Alcohol/week: 0.0 standard drinks of alcohol   Drug use: No   Sexual activity: Not Currently    Birth control/protection: Surgical    Comment: hysterectomy  Other Topics Concern   Not on file  Social History Narrative   Widow (2022), has 3 children (one lives near her).  Has 8 grandchildren, 2 greatgrandchildren.  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  No alcohol.  No drugs.Exercise: clean, work in yard.  10  siblings in all--5 have passed away--1 child age 30 with whooping cough , 1 lupus, 1 melanoma,1 lung cancer, 1 pulm fibrosis   Social Determinants of Health   Financial Resource Strain: Low Risk  (06/06/2022)   Overall Financial Resource Strain (CARDIA)    Difficulty of Paying Living Expenses: Not hard at all  Food Insecurity: No Food Insecurity (06/06/2022)   Hunger Vital Sign    Worried About Running Out of Food in the Last Year: Never true    Ran Out of Food in the Last Year: Never true  Transportation Needs: Unmet Transportation Needs (06/06/2022)   PRAPARE - Transportation    Lack of Transportation (Medical): No    Lack of Transportation (Non-Medical): Yes  Physical Activity: Sufficiently Active (06/06/2022)   Exercise Vital Sign    Days of Exercise per Week: 7 days     Minutes of Exercise per Session: 60 min  Stress: No Stress Concern Present (06/06/2022)   [place]    Feeling of Stress : Only a little  Social Connections: Moderately Isolated (06/06/2022)   Social Connection and Isolation Panel [NHANES]    Frequency of Communication with Friends and Family: More than three times a week    Frequency of Social Gatherings with Friends and Family: More than three times a week    Attends Religious Services: 1 to 4 times per year    Active Member of [employer] or Organizations: No    Attends Archivist Meetings: Never    Marital Status: Widowed   Past Surgical History:  Procedure Laterality Date   ABDOMINAL HYSTERECTOMY  03/2015   TAH.  Last pap 2016.  No hx of abnl paps.  Per GYN, no further pap smears indicated.   ANTERIOR AND POSTERIOR REPAIR N/A 03/18/2014   Procedure: Cystocele repair with graft, Vault suspension, Rectocele repair;  Surgeon: [name], MD;  Location: [facility];  Service: Urology;  Laterality: N/A;   BIOPSY THYROID  06/2021   benign   CHOLECYSTECTOMY     CHOLECYSTECTOMY, LAPAROSCOPIC     COLON SURGERY     COLONOSCOPY  04/2006; 05/26/16   2007 (Dr. [name]): Normal.  04/2016 (Dr. [name]) normal except diverticulosis and decreased anal sphincter tone.  No repeat colonoscopy is recommended due to age.   cspine surgery     Dr. Wiliam Ke level ant cerv discectomy [name] w/plating   CYSTO N/A 03/18/2014   Procedure: CYSTO;  Surgeon: [name], MD;  Location: [facility];  Service: Urology;  Laterality: N/A;   DEXA  07/30/2015; 06/2018   2017 and 2020 -->Osteopenia--repeat 2 yrs. T score -2.29 Mar 2021. Rpt 2 yrs.   LYSIS OF ADHESION N/A 01/31/2018   Procedure: POSSIBLE LYSIS OF ADHESION;  Surgeon: Everitt Amber, MD;  Location: [facility];  Service: Gynecology;  Laterality: N/A;   [place]     with prolasped bledder repair   RESECTION OF COLON      BENIGN TUMOR   RIGHT EAR SURGERY  08/23/2017   Dr. 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  Procedure: XI ROBOTIC ASSISTED BILATERAL SALPINGO OOPHORECTOMY;  Surgeon: Everitt Amber, MD;  [facility]: [facility];  Service: Gynecology;  Laterality: Bilateral;   TRANSTHORACIC ECHOCARDIOGRAM  01/27/2020   2021 EF 65-70%, NORMAL. 12/01/21 grd I DD, o/w normal.   TUBAL LIGATION     Past Medical History:  Diagnosis Date   Allergic rhinitis    maple pollen   Anxiety    Arthritis    Bowel obstruction (HCC)    Chronic low back pain    Dr. Trenton Gammon.  Has had back injection--bp elevated after.   Chronic pain syndrome    Chronic renal insufficiency, stage 2 (mild)    GFR 60-70   DDD (degenerative disc disease), cervical    Hx of ACDF (Dr. [name]).  Followed by Dr. [name].  Also, Dr. [name] do left C7-T1 intralaminal epidural injection.   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  Resid R arm and leg weakness + flexion contractures.   Hypercholesteremia    mild; pt declined statin trial 05/2014--needs recheck lipid panel at first f/u visit in 2017   Hypertension    LBBB (left bundle branch block)    Osteopenia 06/2017   T score -1.4 FRAX 15% / 2%   Ovarian cyst 2017   82019-robotic  assisted bilatel SPO: all path benign.   Perianal dermatitis    prn cutivate   Phlebitis    Right hemiplegia ([place]) 01/2020   R arm and leg (hemorrhagic L cerebral hemisphere CVA)   Right knee injury 2018   Patellofemoral crush injury--Dr. [name].   Solitary thyroid nodule 05/2021   needs bx->plan endo ref   Syncope 10/2021   cardiac w/u as per Dr. Radford Pax   Syncope    2023 (vasovagal suspected)   Trochanteric bursitis of right hip    Recurrent (injection by Dr. 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 [name]). Recurrent fungal/bact OE.   Varicose vein    left leg    Ht 5' (1.524 m)   Wt 121 lb (54.9 kg)   LMP 06/27/1972   BMI 23.63 kg/m   Opioid Risk Score:   Fall Risk Score:  `1  Depression screen PHQ 2/9     07/13/2022    1:39 PM 05/12/2022   11:57 AM 04/13/2022    3:10 PM 01/26/2022    2:58 PM 11/11/2021    1:16 PM 11/10/2021    3:29 PM 08/04/2021    3:24 PM  Depression screen PHQ 2/9  Decreased Interest 1 0 0 0 0 0 0  Down, Depressed, Hopeless 1 0 0 0 0 0 0  PHQ - 2 Score 2 0 0 0 0 0 0    Review of Systems  Cardiovascular:  Positive for leg swelling.  Gastrointestinal:  Positive for constipation and diarrhea.  Genitourinary:  Positive for flank pain.  Musculoskeletal:  Positive for gait problem.       Spasms  Neurological:  Positive for dizziness, tremors and numbness.  All other systems reviewed and are negative.      Objective:   Physical Exam General: No acute distress HEENT: NCAT, EOMI, oral membranes moist Cards: reg rate  Chest: normal effort Abdomen: Soft, NT, ND Skin: dry, intact Extremities: no edema Psych: pleasant and appropriate  Neuro: Alert and oriented x 3. Normal insight and awareness. Intact Memory. Normal language and speech. Cranial nerve exam unremarkable. RUE 4+/5 with minimal resting tone.Marland Kitchen RLE 4/5 except for right heel cord tightness. Tone 2+/4  Right gastrocs and tib posterior are tight in addition to EHL once again. Could stretch to neutral. Heel cord tight Musculoskeletal: minimal pain with ROM,          Assessment & Plan:  1. 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             -ongoing dialogue today regarding her spasticity/contractures  -needs to be more realistic about her prognosis  -would be best served living with son who has offered his home 2.     HTN:                per primary          3. Spasticity:             -ongoing tone RLE, really with contacture             -still some extensor tone/varus in right heel and foot             -tiizanidine has been prescribed, PCP filling it currently 4 History of small volume rectal bleeding likely traumatic due to hard stool             - moving bowels regularly             -f/u per primary   5. Neuropathic pain:             -carbamezipine trial was discussed again. She has rx but never has taken   6. Mild intentional tremor:             -likely related to fatigue in stroke-affected RUE             -at this point it appears mild and is not functionally impairing             -recommend observation for now     30+ minutes of face to face patient care time were spent during this visit. All questions were encouraged and answered.  f/u 6 mos

## 2022-07-13 NOTE — Patient Instructions (Signed)
ALWAYS FEEL FREE TO CALL OUR OFFICE WITH ANY PROBLEMS OR QUESTIONS ([phone])  **PLEASE NOTE** ALL MEDICATION REFILL REQUESTS (INCLUDING CONTROLLED SUBSTANCES) NEED TO BE MADE AT LEAST 7 DAYS PRIOR TO REFILL BEING DUE. ANY REFILL REQUESTS INSIDE THAT TIME FRAME MAY RESULT IN DELAYS IN RECEIVING YOUR PRESCRIPTION.                    

## 2022-07-19 DIAGNOSIS — Z9889 Other specified postprocedural states: Secondary | ICD-10-CM | POA: Diagnosis not present

## 2022-07-19 DIAGNOSIS — H90A31 Mixed conductive and sensorineural hearing loss, unilateral, right ear with restricted hearing on the contralateral side: Secondary | ICD-10-CM | POA: Diagnosis not present

## 2022-07-19 DIAGNOSIS — H7291 Unspecified perforation of tympanic membrane, right ear: Secondary | ICD-10-CM | POA: Diagnosis not present

## 2022-07-19 DIAGNOSIS — H90A22 Sensorineural hearing loss, unilateral, left ear, with restricted hearing on the contralateral side: Secondary | ICD-10-CM | POA: Diagnosis not present

## 2022-07-20 ENCOUNTER — Ambulatory Visit (INDEPENDENT_AMBULATORY_CARE_PROVIDER_SITE_OTHER): Payer: Medicare Other

## 2022-07-20 DIAGNOSIS — Z0000 Encounter for general adult medical examination without abnormal findings: Secondary | ICD-10-CM

## 2022-07-20 NOTE — Patient Instructions (Signed)

## 2022-07-20 NOTE — Progress Notes (Signed)
Subjective:   Alison Cuevas is a 77 y.o. female who presents for Medicare Annual (Subsequent) preventive examination.  I connected with  Alison Cuevas on 07/20/22 by an audio only telemedicine application and verified that I am speaking with the correct person using two identifiers.   I discussed the limitations, risks, security and privacy concerns of performing an evaluation and management service by telephone and the availability of in person appointments. I also discussed with the patient that there may be a patient responsible charge related to this service. The patient expressed understanding and verbally consented to this telephonic visit.  Location of Patient: home Location of Provider: office  List any persons and their role that are participating in the visit with the patient.   [place], CMA  Review of Systems    Defer to PCP Cardiac Risk Factors include: advanced age (>52mn, >>96women);hypertension     Objective:    Today's Vitals   07/20/22 1324  PainSc: 4    There is no height or weight on file to calculate BMI.     07/20/2022    1:22 PM 11/10/2021    3:29 PM 11/02/2021    5:43 PM 09/04/2021    2:48 PM 08/04/2021    3:24 PM 07/14/2021   10:58 AM 05/16/2021    9:06 PM  Advanced Directives  Does Patient Have a Medical Advance Directive? Yes Yes No Yes Yes Yes No  Type of AParamed[place]of [place] will [place]  Healthcare Power of [place] will;Out of facility DNR (pink MOST or yellow form) Healthcare Power of Attorney   Does patient want to make changes to medical advance directive? No - Patient declined        Copy of [place]op[place] Chart? No - copy requested        Would patient like information on creating a medical advance directive?       No - Patient declined    Current Medications (verified) Outpatient Encounter Medications  as of 07/20/2022  Medication Sig   acetaminophen (TYLENOL) 500 MG tablet Take 1 tablet (500 mg total) by mouth 3 (three) times daily.   ALPRAZolam (XANAX) 0.5 MG tablet TAKE 1 TABLET BY MOUTH THREE TIMES A DAY AS NEEDED FOR ANXIETY   aspirin EC 81 MG tablet Take 1 tablet (81 mg total) by mouth daily. Swallow whole.   Biotin 5 MG TABS Take 5 mg by mouth daily.   clobetasol (TEMOVATE) 0.05 % external solution Apply topically.   fluticasone (FLONASE) 50 MCG/ACT nasal spray Place 2 sprays into both nostrils at bedtime.   HYDROcodone-acetaminophen (NORCO/VICODIN) 5-325 MG tablet Take 1 tablet by mouth every 6 (six) hours as needed for moderate pain.   ibuprofen (ADVIL) 200 MG tablet Take 200 mg by mouth every 6 (six) hours as needed.   lisinopril (ZESTRIL) 5 MG tablet TAKE 1 TABLET (5 MG TOTAL) BY MOUTH DAILY.   loratadine (CLARITIN) 10 MG tablet Take 10 mg by mouth every other day.    meclizine (ANTIVERT) 12.5 MG tablet Take 1 tablet (12.5 mg total) by mouth 3 (three) times daily.   Multiple Minerals-Vitamins (CALCIUM-MAGNESIUM-ZINC-D3 PO) Take 1 tablet by mouth daily.    nystatin ointment (MYCOSTATIN) Apply 1 application topically 2 (two) times daily. Apply to affected area for up to 7 days.   pantoprazole (PROTONIX) 40 MG tablet TAKE 1 TABLET BY MOUTH EVERYDAY AT BEDTIME  polyethylene glycol (MIRALAX / GLYCOLAX) 17 g packet Take 17 g by mouth daily as needed.   potassium chloride SA (KLOR-CON M20) 20 MEQ tablet TAKE 1 TABLET BY MOUTH EVERY DAY   Sodium Fluoride (CLINPRO 5000) 1.1 % PSTE Clinpro 5000 1.1 % dental paste   tiZANidine (ZANAFLEX) 2 MG tablet TAKE 1 TABLET BY MOUTH 3 TIMES DAILY AS NEEDED FOR MUSCLE RELAXATION   triamcinolone ointment (KENALOG) 0.1 %    verapamil (CALAN-SR) 180 MG CR tablet Take 1 tablet (180 mg total) by mouth daily.   No facility-administered encounter medications on file as of 07/20/2022.    Allergies (verified) Gabapentin and Prednisone   History: Past  Medical History:  Diagnosis Date   Allergic rhinitis    maple pollen   Anxiety    Arthritis    Bowel obstruction (HCC)    Chronic low back pain    Dr. Trenton Gammon.  Has had back injection--bp elevated after.   Chronic pain syndrome    Chronic renal insufficiency, stage 2 (mild)    GFR 60-70   DDD (degenerative disc disease), cervical    Hx of ACDF (Dr. [name]).  Followed by Dr. [name].  Also, Dr. [name] do left C7-T1 intralaminal epidural injection.   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  Resid R arm and leg weakness + flexion contractures.   Hypercholesteremia    mild; pt declined statin trial 05/2014--needs recheck lipid panel at first f/u visit in 2017   Hypertension    LBBB (left bundle branch block)    Osteopenia 06/2017   T score -1.4 FRAX 15% / 2%   Ovarian cyst 2017   82019-robotic assisted bilatel SPO: all path benign.   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 [name].   Solitary thyroid nodule 05/2021   needs bx->plan endo ref   Syncope 10/2021   cardiac w/u as per Dr. Radford Pax   Syncope    2023 (vasovagal suspected)   Trochanteric bursitis of right hip    Recurrent (injection by Dr. 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 [name]). Recurrent fungal/bact OE.   Varicose vein    left leg    Past Surgical History:  Procedure Laterality Date   ABDOMINAL HYSTERECTOMY  03/2015   TAH.  Last pap 2016.  No hx of abnl paps.  Per GYN, no further pap smears indicated.   ANTERIOR AND POSTERIOR  REPAIR N/A 03/18/2014   Procedure: Cystocele repair with graft, Vault suspension, Rectocele repair;  Surgeon: [name], MD;  Location: [facility];  Service: Urology;  Laterality: N/A;   BIOPSY THYROID  06/2021   benign   CHOLECYSTECTOMY     CHOLECYSTECTOMY, LAPAROSCOPIC     COLON SURGERY     COLONOSCOPY  04/2006; 05/26/16   2007 (Dr. [name]): Normal.  04/2016 (Dr. [name]) normal except diverticulosis and decreased anal sphincter tone.  No repeat colonoscopy is recommended due to age.   cspine surgery     Dr. Wiliam Ke level ant cerv discectomy /  fusion w/plating   CYSTO N/A 03/18/2014   Procedure: CYSTO;  Surgeon: [name], MD;  Location: [facility];  Service: Urology;  Laterality: N/A;   DEXA  07/30/2015; 06/2018   2017 and 2020 -->Osteopenia--repeat 2 yrs. T score -2.29 Mar 2021. Rpt 2 yrs.   LYSIS OF ADHESION N/A 01/31/2018   Procedure: POSSIBLE LYSIS OF ADHESION;  Surgeon: Everitt Amber, MD;  Location: [facility];  Service: Gynecology;  Laterality: N/A;   [place]     with prolasped bledder repair   RESECTION OF COLON     BENIGN TUMOR   RIGHT EAR SURGERY  08/23/2017   Dr. 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  Procedure: XI ROBOTIC ASSISTED BILATERAL SALPINGO OOPHORECTOMY;  Surgeon: Everitt Amber, MD;  Location: [facility];  Service: Gynecology;  Laterality: Bilateral;   TRANSTHORACIC ECHOCARDIOGRAM  01/27/2020   2021 EF 65-70%, NORMAL. 12/01/21 grd I DD, o/w normal.   TUBAL LIGATION      Family History  Problem Relation Age of Onset   Thyroid disease Mother    Hypertension Mother    Diverticulitis Mother    Stomach cancer Father    Heart attack Father    Heart failure Father    Breast cancer Sister 2   Lupus Sister    Melanoma Brother    Prostate cancer Brother    Leukemia Brother    Lung disease Brother    Other Other        Family member with MGUS   Social History   Socioeconomic History   Marital status: Widowed    Spouse name: [name]   Number of children: 3   Years of education: Not on file   Highest education level: 8th grade  Occupational History   Occupation: retired  Tobacco Use   Smoking status: Never   Smokeless tobacco: Never  Vaping Use   Vaping Use: Never used  Substance and Sexual Activity   Alcohol use: No    Alcohol/week: 0.0 standard drinks of alcohol   Drug use: No   Sexual activity: Not Currently    Birth control/protection: Surgical    Comment: hysterectomy  Other Topics Concern   Not on file  Social History Narrative   Widow (2022), has 3 children (one lives near her).  Has 8 grandchildren, 2 greatgrandchildren.  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  No alcohol.  No drugs.Exercise: clean, work in yard.  10 siblings in all--5 have passed away--1 child age 50 with whooping cough , 1 lupus, 1 melanoma,1 lung cancer, 1 pulm fibrosis   Social Determinants of Health   Financial Resource Strain: Low Risk  (07/20/2022)   Overall Financial Resource Strain (CARDIA)    Difficulty of Paying Living Expenses: Not hard at all  Food Insecurity: No Food Insecurity (07/20/2022)   Hunger Vital Sign    Worried About Running Out of Food in the Last Year: Never true    Ran Out of Food in the Last Year: Never true  Transportation  Needs: Unmet Transportation Needs (07/20/2022)   PRAPARE - Transportation    Lack of Transportation (Medical): No    Lack of Transportation  (Non-Medical): Yes  Physical Activity: Sufficiently Active (07/20/2022)   Exercise Vital Sign    Days of Exercise per Week: 7 days    Minutes of Exercise per Session: 60 min  Stress: No Stress Concern Present (07/20/2022)   [place]    Feeling of Stress : Only a little  Social Connections: Moderately Isolated (07/20/2022)   Social Connection and Isolation Panel [NHANES]    Frequency of Communication with Friends and Family: More than three times a week    Frequency of Social Gatherings with Friends and Family: More than three times a week    Attends Religious Services: 1 to 4 times per year    Active Member of [employer] or Organizations: No    Attends Archivist Meetings: Never    Marital Status: Widowed    Tobacco Counseling Counseling given: Not Answered   Clinical Intake:  Pre-visit preparation completed: No  Pain : 0-10 Pain Score: 4  Pain Type: Chronic pain Pain Location: Knee     Diabetes: No  How often do you need to have someone help you when you read instructions, pamphlets, or other written materials from your doctor or pharmacy?: 1 - Never What is the last grade level you completed in school?: 9th  Diabetic?no         Activities of Daily Living    07/20/2022    1:22 PM  In your present state of health, do you have any difficulty performing the following activities:  Hearing? 1  Vision? 0  Difficulty concentrating or making decisions? 0  Walking or climbing stairs? 0  Dressing or bathing? 0  Doing errands, shopping? 0  Preparing Food and eating ? N  Using the Toilet? N  In the past six months, have you accidently leaked urine? N  Do you have problems with loss of bowel control? N  Managing your Medications? N  Managing your Finances? N  Housekeeping or managing your Housekeeping? N    Patient Care Team: [name], MD as PCP - General (Family Medicine) Bjorn Loser, MD as Consulting Physician (Urology) Phineas Real, Belinda Block, MD (Inactive) as Consulting Physician (Gynecology) Everitt Amber, MD as Consulting Physician (Obstetrics and Gynecology) Gatha Mayer, MD as Consulting Physician (Gastroenterology) Laroy Apple, MD as Referring Physician (Physical Medicine and Rehabilitation) Leilani Merl, Dwan Bolt, MD as Referring Physician (Dermatology) Leta Baptist, MD as Consulting Physician (Otolaryngology) Levy Sjogren, MD as Referring Physician (Dermatology) Joseph Pierini, MD (Inactive) as Consulting Physician (Obstetrics and Gynecology) Garvin Fila, MD as Consulting Physician (Neurology) Pedro Earls, MD as Consulting Physician (Sports Medicine) Meredith Staggers, MD as Consulting Physician (Physical Medicine and Rehabilitation) Sueanne Margarita, MD as Consulting Physician (Cardiology)  Indicate any recent Medical Services you may have received from other than [facility] providers in the past year (date may be approximate).     Assessment:   This is a routine wellness examination for Arra.  Hearing/Vision screen No results found.  Dietary issues and exercise activities discussed: Current Exercise Habits: Home exercise routine, Time (Minutes): 60, Frequency (Times/Week): 7, Weekly Exercise (Minutes/Week): 420   Goals Addressed   None   Depression Screen    07/20/2022    1:21 PM 07/13/2022    1:39 PM 05/12/2022   11:57 AM 04/13/2022    3:10  PM 01/26/2022    2:58 PM 11/11/2021    1:16 PM 11/10/2021    3:29 PM  PHQ 2/9 Scores  PHQ - 2 Score 0 2 0 0 0 0 0    Fall Risk    07/20/2022    1:22 PM 07/13/2022    1:39 PM 06/06/2022    5:46 PM 04/13/2022    3:09 PM 01/26/2022    2:58 PM  [place] in the past year? 0 0 0 0 0  Number falls in past yr:  0   0  Injury with Fall?  0   0  Risk for fall due to : No Fall Risks      Follow up Falls evaluation completed        [place]:  Any  stairs in or around the home? Yes  If so, are there any without handrails? No  Home free of loose throw rugs in walkways, pet beds, electrical cords, etc? Yes  Adequate lighting in your home to reduce risk of falls? Yes   ASSISTIVE DEVICES UTILIZED TO PREVENT FALLS:  Life alert? Yes  Use of a cane, walker or w/c? Yes  Grab bars in the bathroom? Yes  Shower chair or bench in shower? Yes  Elevated toilet seat or a handicapped toilet? Yes   TIMED UP AND GO:  Was the test performed? No .  Length of time to ambulate 10 feet: n/a sec.    Cognitive Function:    01/26/2018   10:03 AM  MMSE - Mini Mental State Exam  Orientation to time 4  Orientation to Place 5  Registration 3  Attention/ Calculation 5  Recall 2  Language- name 2 objects 2  Language- repeat 1  Language- follow 3 step command 3  Language- read & follow direction 1  Write a sentence 1  Copy design 0  Total score 27        07/20/2022    1:23 PM 07/14/2021   11:07 AM  6CIT Screen  What Year? 0 points 0 points  What month? 0 points 0 points  What time? 0 points 0 points  Count back from 20 0 points 0 points  Months in reverse 4 points 4 points  Repeat phrase 0 points 6 points  Total Score 4 points 10 points    Immunizations Immunization History  Administered Date(s) Administered   Fluad Quad(high Dose 65+) 04/03/2019, 05/06/2021   Influenza Split 04/27/2013   Influenza, High Dose Seasonal PF 04/27/2020   Influenza-Unspecified 03/30/2015, 03/30/2018, 03/08/2022   Moderna Sars-Covid-2 Vaccination 08/01/2019, 08/30/2019   Pneumococcal Conjugate-13 12/23/2014   Pneumococcal Polysaccharide-23 05/24/2010, 11/16/2010   Tdap 09/29/2014, 12/17/2016   Zoster, Live 11/18/2014    TDAP status: Up to date  Flu Vaccine status: Up to date  Pneumococcal vaccine status: Up to date  Covid-19 vaccine status: Completed vaccines  Qualifies for Shingles Vaccine? Yes   Zostavax completed No   Shingrix Completed?:  No.    Education has been provided regarding the importance of this vaccine. Patient has been advised to call insurance company to determine out of pocket expense if they have not yet received this vaccine. Advised may also receive vaccine at local pharmacy or Health Dept. Verbalized acceptance and understanding.  Screening Tests Health Maintenance  Topic Date Due   COVID-19 Vaccine (3 - Moderna risk series) 09/27/2019   Zoster Vaccines- Shingrix (1 of 2) 09/06/2022 (Originally 09/30/1964)   Medicare Annual  Wellness (AWV)  07/21/2023   DTaP/Tdap/Td (3 - Td or Tdap) 12/18/2026   Pneumonia Vaccine 26+ Years old  Completed   INFLUENZA VACCINE  Completed   DEXA SCAN  Completed   Hepatitis C Screening  Completed   HPV VACCINES  Aged Out   COLONOSCOPY (Pts 45-8yr Insurance coverage will need to be confirmed)  Discontinued    Health Maintenance  Health Maintenance Due  Topic Date Due   COVID-19 Vaccine (3 - Moderna risk series) 09/27/2019    Colorectal cancer screening: No longer required.   Mammogram status: No longer required due to age.  Bone Density status: Completed 04/13/21. Results reflect: Bone density results: OSTEOPENIA. Repeat every 2 years.  Lung Cancer Screening: (Low Dose CT Chest recommended if Age 78-80years, 30 pack-year currently smoking OR have quit w/in 15years.) does not qualify.   Lung Cancer Screening Referral: n/a  Additional Screening:  Hepatitis C Screening: does qualify; Completed 01/27/2018  Vision Screening: Recommended annual ophthalmology exams for early detection of glaucoma and other disorders of the eye. Is the patient up to date with their annual eye exam?  No  Who is the provider or what is the name of the office in which the patient attends annual eye exams? [facility] If pt is not established with a provider, would they like to be referred to a provider to establish care? Yes .   Dental Screening: Recommended annual dental exams for proper  oral hygiene  Community Resource Referral / Chronic Care Management: CRR required this visit?  No   CCM required this visit?  No      Plan:     I have personally reviewed and noted the following in the patient's chart:   Medical and social history Use of alcohol, tobacco or illicit drugs  Current medications and supplements including opioid prescriptions. Patient is currently taking opioid prescriptions. Information provided to patient regarding non-opioid alternatives. Patient advised to discuss non-opioid treatment plan with their provider. Functional ability and status Nutritional status Physical activity Advanced directives List of other physicians Hospitalizations, surgeries, and ER visits in previous 12 months Vitals Screenings to include cognitive, depression, and falls Referrals and appointments  In addition, I have reviewed and discussed with patient certain preventive protocols, quality metrics, and best practice recommendations. A written personalized care plan for preventive services as well as general preventive health recommendations were provided to patient.     VBeatrix Fetters [place]  07/20/2022   Nurse Notes: Non-Face to Face or Face to Face 7 minute visit Encounter  Ms. Blumstein , Thank you for taking time to come for your Medicare Wellness Visit. I appreciate your ongoing commitment to your health goals. Please review the following plan we discussed and let me know if I can assist you in the future.   These are the goals we discussed:  Goals       <enter goal here> (pt-stated)      Maintain current health by remaining active.       Patient Stated      Maintain current health.       Patient Stated      Doing alI know to do to get well         This is a list of the screening recommended for you and due dates:  Health Maintenance  Topic Date Due   COVID-19 Vaccine (3 - Moderna risk series) 09/27/2019   Zoster (Shingles) Vaccine (1 of 2)  09/06/2022*  Medicare Annual Wellness Visit  07/21/2023   DTaP/Tdap/Td vaccine (3 - Td or Tdap) 12/18/2026   Pneumonia Vaccine  Completed   Flu Shot  Completed   DEXA scan (bone density measurement)  Completed   Hepatitis C Screening: USPSTF Recommendation to screen - Ages 57-79 yo.  Completed   HPV Vaccine  Aged Out   Colon Cancer Screening  Discontinued  *Topic was postponed. The date shown is not the original due date.

## 2022-08-03 DIAGNOSIS — H524 Presbyopia: Secondary | ICD-10-CM | POA: Diagnosis not present

## 2022-08-03 DIAGNOSIS — H52223 Regular astigmatism, bilateral: Secondary | ICD-10-CM | POA: Diagnosis not present

## 2022-08-03 DIAGNOSIS — H2513 Age-related nuclear cataract, bilateral: Secondary | ICD-10-CM | POA: Diagnosis not present

## 2022-08-13 ENCOUNTER — Emergency Department (HOSPITAL_COMMUNITY)
Admission: EM | Admit: 2022-08-13 | Discharge: 2022-08-13 | Disposition: A | Discharge status: Home / Self Care | Payer: Medicare Other | Attending: Emergency Medicine | Admitting: Emergency Medicine

## 2022-08-13 ENCOUNTER — Other Ambulatory Visit: Payer: Self-pay

## 2022-08-13 ENCOUNTER — Encounter (HOSPITAL_COMMUNITY): Payer: Self-pay

## 2022-08-13 DIAGNOSIS — Z79899 Other long term (current) drug therapy: Secondary | ICD-10-CM | POA: Insufficient documentation

## 2022-08-13 DIAGNOSIS — R300 Dysuria: Secondary | ICD-10-CM | POA: Insufficient documentation

## 2022-08-13 DIAGNOSIS — Z7982 Long term (current) use of aspirin: Secondary | ICD-10-CM | POA: Insufficient documentation

## 2022-08-13 DIAGNOSIS — I10 Essential (primary) hypertension: Secondary | ICD-10-CM | POA: Insufficient documentation

## 2022-08-13 LAB — URINALYSIS, W/ REFLEX TO CULTURE (INFECTION SUSPECTED)
Bacteria, UA: NONE SEEN
Bilirubin Urine: NEGATIVE
Glucose, UA: NEGATIVE mg/dL
Hgb urine dipstick: NEGATIVE
Ketones, ur: NEGATIVE mg/dL
Leukocytes,Ua: NEGATIVE
Nitrite: NEGATIVE
Protein, ur: NEGATIVE mg/dL
Specific Gravity, Urine: 1.005 (ref 1.005–1.030)
pH: 7 (ref 5.0–8.0)

## 2022-08-13 MED ORDER — CEPHALEXIN 500 MG PO CAPS: 500.0000 mg | ORAL_CAPSULE | Freq: Four times a day (QID) | ORAL | 0 refills | Status: DC

## 2022-08-13 MED ORDER — CEPHALEXIN 500 MG PO CAPS
500.0000 mg | ORAL_CAPSULE | Freq: Once | ORAL | Status: AC
  Administered 2022-08-13: 500 mg via ORAL
  Filled 2022-08-13: qty 1

## 2022-08-13 NOTE — ED Triage Notes (Signed)
Pt reports she has some burning with urination and took a home UTI test that was positive.

## 2022-08-13 NOTE — Discharge Instructions (Signed)
Follow up with your md if not improving. °

## 2022-08-13 NOTE — ED Provider Notes (Signed)
[facility] Provider Note   CSN: [id number] Arrival date & time: 08/13/22  1926     History {Add pertinent medical, surgical, social history, OB history to HPI:1} Chief Complaint  Patient presents with   Dysuria    Alison Cuevas is a 77 y.o. female.  He has history of hypertension.  She complains of dysuria.   Dysuria      Home Medications Prior to Admission medications   Medication Sig Start Date End Date Taking? Authorizing Provider  cephALEXin (KEFLEX) 500 MG capsule Take 1 capsule (500 mg total) by mouth 4 (four) times daily. 08/13/22  Yes Milton Ferguson, MD  acetaminophen (TYLENOL) 500 MG tablet Take 1 tablet (500 mg total) by mouth 3 (three) times daily. 02/19/20   Love, Ivan Anchors, PA-C  ALPRAZolam (XANAX) 0.5 MG tablet TAKE 1 TABLET BY MOUTH THREE TIMES A DAY AS NEEDED FOR ANXIETY 02/22/22   McGowen, Adrian Blackwater, MD  aspirin EC 81 MG tablet Take 1 tablet (81 mg total) by mouth daily. Swallow whole. 04/20/20   Frann Rider, NP  Biotin 5 MG TABS Take 5 mg by mouth daily.    [provider]  clobetasol (TEMOVATE) 0.05 % external solution Apply topically. 01/13/22   [provider]  fluticasone (FLONASE) 50 MCG/ACT nasal spray Place 2 sprays into both nostrils at bedtime. 05/17/16   McGowen, Adrian Blackwater, MD  HYDROcodone-acetaminophen (NORCO/VICODIN) 5-325 MG tablet Take 1 tablet by mouth every 6 (six) hours as needed for moderate pain. 01/19/22   McGowen, Adrian Blackwater, MD  ibuprofen (ADVIL) 200 MG tablet Take 200 mg by mouth every 6 (six) hours as needed.    [provider]  lisinopril (ZESTRIL) 5 MG tablet TAKE 1 TABLET (5 MG TOTAL) BY MOUTH DAILY. 06/02/22   McGowen, Adrian Blackwater, MD  loratadine (CLARITIN) 10 MG tablet Take 10 mg by mouth every other day.     [provider]  meclizine (ANTIVERT) 12.5 MG tablet Take 1 tablet (12.5 mg total) by mouth 3 (three) times daily. 02/19/20   Love, Ivan Anchors, PA-C   Multiple Minerals-Vitamins (CALCIUM-MAGNESIUM-ZINC-D3 PO) Take 1 tablet by mouth daily.     [provider]  nystatin ointment (MYCOSTATIN) Apply 1 application topically 2 (two) times daily. Apply to affected area for up to 7 days. 10/20/20   Karma Ganja, NP  pantoprazole (PROTONIX) 40 MG tablet TAKE 1 TABLET BY MOUTH EVERYDAY AT BEDTIME 05/17/22   McGowen, Adrian Blackwater, MD  polyethylene glycol (MIRALAX / GLYCOLAX) 17 g packet Take 17 g by mouth daily as needed.    [provider]  potassium chloride SA (KLOR-CON M20) 20 MEQ tablet TAKE 1 TABLET BY MOUTH EVERY DAY 06/02/22   McGowen, Adrian Blackwater, MD  Sodium Fluoride (CLINPRO 5000) 1.1 % PSTE Clinpro 5000 1.1 % dental paste    [provider]  tiZANidine (ZANAFLEX) 2 MG tablet TAKE 1 TABLET BY MOUTH 3 TIMES DAILY AS NEEDED FOR MUSCLE RELAXATION 06/13/22   McGowen, Adrian Blackwater, MD  triamcinolone ointment (KENALOG) 0.1 %     [provider]  verapamil (CALAN-SR) 180 MG CR tablet Take 1 tablet (180 mg total) by mouth daily. 02/17/22   McGowen, Adrian Blackwater, MD      Allergies    Gabapentin and Prednisone    Review of Systems   Review of Systems  Genitourinary:  Positive for dysuria.    Physical Exam Updated Vital Signs BP (!) 144/74   Pulse 94  Temp 97.9 F (36.6 C) (Oral)   Resp 16   Ht 5' (1.524 m)   Wt 54.9 kg   LMP 06/27/1972   SpO2 97%   BMI 23.64 kg/m  Physical Exam  ED Results / Procedures / Treatments   Labs (all labs ordered are listed, but only abnormal results are displayed) Labs Reviewed  URINALYSIS, W/ REFLEX TO CULTURE (INFECTION SUSPECTED) - Abnormal; Notable for the following components:      Result Value   Color, Urine STRAW (*)    All other components within normal limits  URINE CULTURE    EKG None  Radiology No results found.  Procedures Procedures  {Document cardiac monitor, telemetry assessment procedure when appropriate:1}  Medications Ordered in ED Medications   cephALEXin (KEFLEX) capsule 500 mg (has no administration in time range)    ED Course/ Medical Decision Making/ A&P   {   Click here for ABCD2, HEART and other calculatorsREFRESH Note before signing :1}                          Medical Decision Making Amount and/or Complexity of Data Reviewed Labs: ordered.  Risk Prescription drug management.   Patient with dysuria and mild suprapubic discomfort.  Urinalysis is unremarkable.  She will get a urine culture and empirically started on antibiotics.  {Document critical care time when appropriate:1} {Document review of labs and clinical decision tools ie heart score, Chads2Vasc2 etc:1}  {Document your independent review of radiology images, and any outside records:1} {Document your discussion with family members, caretakers, and with consultants:1} {Document social determinants of health affecting pt's care:1} {Document your decision making why or why not admission, treatments were needed:1} Final Clinical Impression(s) / ED Diagnoses Final diagnoses:  Dysuria    Rx / DC Orders ED Discharge Orders          Ordered    cephALEXin (KEFLEX) 500 MG capsule  4 times daily        08/13/22 2136

## 2022-08-15 ENCOUNTER — Telehealth: Payer: Self-pay

## 2022-08-15 LAB — URINE CULTURE: Culture: NO GROWTH

## 2022-08-15 NOTE — Telephone Encounter (Signed)
Pt went to ED  [facility] RECORD [facility] Patient Name: Alison Cuevas Gender: Female DOB: 01-21-1946 Age: 77 Y 10 M 12 D Return Phone Number: [phone] (Primary), [phone] (Secondary) Address: City/ State/ Zip: [place] [address] Client [facility] - Client Client Site [facility] [facility] Provider Crissie Sickles - MD Contact Type Call Who Is Calling Patient / Member / Family / Caregiver Call Type Triage / Clinical Relationship To Patient Self Return Phone Number [phone] (Secondary) Chief Complaint Urination Pain Reason for Call Symptomatic / Request for [place] states she has UTI , she needing some medicine called in . Caller states she has burning pain while urinating. Translation No Nurse Assessment Nurse: Laqueta Due, RN, Metallurgist (Eastern Time): 08/13/2022 5:33:45 PM Confirm and document reason for call. If symptomatic, describe symptoms. ---Pt having UTI symptoms: burning on urination, having a slight twinge in right side of back (had stroke of right side). having frequency/urgency. onset wednesday/thursday. Pt took [place], states that pharmacist said that it will make it worse. took azo uti test and showed could have UTI. no fever. Does the patient have any new or worsening symptoms? ---Yes Will a triage be completed? ---Yes Related visit to physician within the last 2 weeks? ---No Does the PT have any chronic conditions? (i.e. diabetes, asthma, this includes High risk factors for pregnancy, etc.) ---Yes List chronic conditions. ---history of stroke, fibromyalgia Is this a behavioral health or substance abuse call? ---No Nurse: Laqueta Due, RN, Metallurgist (Eastern Time): 08/13/2022 5:40:15 PM Please select the assessment type ---Verbal order / New medication order Does the client directives allow for assistance  with medications after hours? ---No PLEASE NOTE: All timestamps contained within this report are represented as [place] Standard Time. CONFIDENTIALTY NOTICE: This fax transmission is intended only for the addressee. It contains information that is legally privileged, confidential or otherwise protected from use or disclosure. If you are not the intended recipient, you are strictly prohibited from reviewing, disclosing, copying using or disseminating any of this information or taking any action in reliance on or regarding this information. If you have received this fax in error, please notify us immediately by telephone so that we can arrange for its return to [place]. Phone: [phone], Toll-Free: [phone], Fax: [phone] Page: 2 of 2 Call Id: [id number] Guidelines Guideline Title Affirmed Question Affirmed Notes Nurse Date/Time Eilene Ghazi Time) Urination Pain - Female Side (flank) or lower back pain present Laqueta Due, RN, [employer] 08/13/2022 5:38:20 PM Disp. Time Eilene Ghazi Time) Disposition Final User 08/13/2022 5:31:39 PM Send To RN Personal Carrolyn Meiers 08/13/2022 5:47:37 PM See HCP within 4 Hours (or PCP triage) Yes Laqueta Due, RN, Amber Final Disposition 08/13/2022 5:47:37 PM See HCP within 4 Hours (or PCP triage) Yes Laqueta Due, RN, Agricultural consultant Disagree/Comply Disagree Caller Understands Yes PreDisposition Did not know what to do Care Advice Given Per Guideline SEE HCP (OR PCP TRIAGE) WITHIN 4 HOURS: * IF OFFICE WILL BE CLOSED AND NO PCP (PRIMARY CARE PROVIDER) SECOND-LEVEL TRIAGE: You need to be seen within the next 3 or 4 hours. A nearby Urgent Care Center [facility]) is often a good source of care. Another choice is to go to the ED. Go sooner if you become worse. CALL BACK IF: * You become worse CARE ADVICE given per Urination Pain - Female (Adult) guideline. Referrals [facility] -  UC GO TO FACILITY  REFUSED GO TO FACILITY UNDECIDED

## 2022-08-16 ENCOUNTER — Telehealth: Payer: Self-pay

## 2022-08-16 NOTE — Transitions of Care (Post Inpatient/ED Visit) (Signed)
   08/16/2022  Name: Larhonda Servello MRN: [id number] DOB: 1946-06-22  Today's TOC FU Call Status: Today's TOC FU Call Status:: Successful TOC FU Call Competed TOC FU Call Complete Date: 08/16/22  Transition Care Management Follow-up Telephone Call Date of Discharge: 08/13/22 (Red on EMMI-ED Discharge Alert Date & Reason:2/19/4-"Scheduled follow-up appt? No") Discharge Facility: [facility] ([facility]) Type of Discharge: Emergency Department Reason for ED Visit: Other: ("dysuria") How have you been since you were released from the hospital?:  (Patient skeptical of the nature of calls-states she gets a lot of spam calls-RN CM explaind the nature/purpose of the call. She voicd she did not wish to continue with the call.)  Items Reviewed:    Home Care and Equipment/Supplies:    Functional Questionnaire:    Follow up appointments reviewed:   TOC Interventions Today    Flowsheet Row Most Recent Value  TOC Interventions   TOC Interventions Discussed/Reviewed TOC Interventions Discussed  [explained purpose of TOC call to patient]          Enzo Montgomery, RN,BSN,CCM [facility] Telephonic Care Management Coordinator Direct Phone: [phone] Toll Free: [phone] Fax: [phone]
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
## 2022-08-16 NOTE — Transitions of Care (Post Inpatient/ED Visit) (Signed)
   08/16/2022  Name: Alison Cuevas MRN: [id number] DOB: 04-08-1946  Today's TOC FU Call Status: Today's TOC FU Call Status:: Unsuccessul Call (1st Attempt) Unsuccessful Call (1st Attempt) Date: 08/16/22 (Red on EMMI-ED Discharge Alert Date & Reason:08/15/22-"Scheduled follow-up appt? No")  Attempted to reach the patient regarding the most recent Inpatient/ED visit.  Follow Up Plan: Additional outreach attempts will be made to reach the patient to complete the Transitions of Care (Post Inpatient/ED visit) call.     Enzo Montgomery, RN,BSN,CCM [facility] Telephonic Care Management Coordinator Direct Phone: [phone] Toll Free: [phone] Fax: [phone]

## 2022-08-18 ENCOUNTER — Ambulatory Visit (INDEPENDENT_AMBULATORY_CARE_PROVIDER_SITE_OTHER): Payer: Medicare Other | Admitting: Obstetrics & Gynecology

## 2022-08-18 ENCOUNTER — Encounter: Payer: Self-pay | Admitting: Obstetrics & Gynecology

## 2022-08-18 VITALS — BP 128/70 | HR 78

## 2022-08-18 DIAGNOSIS — N9089 Other specified noninflammatory disorders of vulva and perineum: Secondary | ICD-10-CM | POA: Diagnosis not present

## 2022-08-18 DIAGNOSIS — B3731 Acute candidiasis of vulva and vagina: Secondary | ICD-10-CM

## 2022-08-18 LAB — WET PREP FOR TRICH, YEAST, CLUE

## 2022-08-18 MED ORDER — FLUCONAZOLE 150 MG PO TABS: 150.0000 mg | ORAL_TABLET | ORAL | 0 refills | Status: AC

## 2022-08-18 NOTE — Progress Notes (Signed)
    Alison Cuevas 07-19-1945 [id number]        77 y.o.  [id number]   RP: Vulvar burning  HPI: Started on Keflex on 08/13/22 for a possible Cystitis.  Still taking the ABTx.  U. Culture No growth.  C/O vulvar burning for a few days since started on Keflex.  No PMB.  No pelvic pain. No UTI Sx.  Wears a protection for stool incontinence.  No fever.   OB History  Gravida Para Term Preterm AB Living  4 3 3   1 3  $ SAB IAB Ectopic Multiple Live Births  1       3    # Outcome Date GA Lbr Len/2nd Weight Sex Delivery Anes PTL Lv  4 SAB           3 Term           2 Term           1 Term             Past medical history,surgical history, problem list, medications, allergies, family history and social history were all reviewed and documented in the EPIC chart.   Directed ROS with pertinent positives and negatives documented in the history of present illness/assessment and plan.  Exam:  Vitals:   08/18/22 1333  BP: 128/70  Pulse: 78  SpO2: 97%   General appearance:  Normal  Gynecologic exam: Vulva mildly irritated.  Typical yeasty d/c present.  Wet prep done vaginally and at vulva.  Wet prep:  Yeasts present   Assessment/Plan:  77 y.o. [id number]   1. Vulvar irritation Started on Keflex on 08/13/22 for a possible Cystitis.  Still taking the ABTx.  U. Culture No growth.  C/O vulvar burning for a few days since started on Keflex.  No PMB.  No pelvic pain. No UTI Sx.  Wears a protection for stool incontinence.  No fever. Mild vulvar irritation with typical yeasty discharge.  Wet prep confirmed yeast vulvovaginitis.  Patient informed.  Will stop Keflex and start Fluconazole treatment.  Kenalog ointment 0.1% on vulva as needed.  Prescription sent to pharmacy. - WET PREP FOR [place], YEAST, CLUE  Other orders - fluconazole (DIFLUCAN) 150 MG tablet; Take 1 tablet (150 mg total) by mouth every other day for 3 doses.   Princess Bruins MD, 1:59 PM 08/18/2022

## 2022-08-23 ENCOUNTER — Ambulatory Visit: Payer: Medicare Other | Admitting: Family Medicine

## 2022-08-29 ENCOUNTER — Telehealth: Payer: Self-pay

## 2022-08-29 MED ORDER — NYSTATIN 100000 UNIT/ML MT SUSP: OROMUCOSAL | 1 refills | Status: DC

## 2022-08-29 NOTE — Telephone Encounter (Signed)
I spoke with pharmacist at [facility] in [place]. She advised that nystatin is available in a commercial Nystatin suspension. She advised regarding recommended dosing etc. I spoke with Dr. Marguerita Merles and she agrees with this.  [facility] did not have this available and it was also on back order at [facility]. I located it at [facility] in [place] and patient was advised of this. She agreed.  Recommended treatment is 7-14 days so as pharmacy suggested and Dr. Marguerita Merles agreed 7 days of treatment as prescribed with one refill. I advised patient if not completely well at end of 7 days she can obtain the refill.  Rx was sent to [facility]

## 2022-08-29 NOTE — Telephone Encounter (Signed)
Patient called back. She forgot to tell me that last Thursday evening she went to brush her teeth and accidentally put Benadryl cream on her toothbrush. As soon as she put it in her mouth she realized and spit it out and rinsed well.  She was worried it had caused this thrush like problem in her mouth.  She acknowleges that her mouth hurt prior to putting the benadryl cream in her mouth.  I reassured her and told her that her sx sound like thrush and I did not think she hurt anything with the benadryl cream.  I assured her I would check with Dr. Marguerita Merles and let her know if she felt differently.

## 2022-08-29 NOTE — Telephone Encounter (Signed)
Alison Bruins, MD  You7 minutes ago (3:08 PM)   Nystatin mouth wash:  1/8 teaspoonful of powder in 4 ounces of water.  Divide in small mouthfuls, swish, gargle as long as possible, then swallow.  Or send a prescription of Nystatin lozenges if patient prefers.Marland KitchenMarland Kitchen

## 2022-08-29 NOTE — Telephone Encounter (Signed)
Patient was treated 08/18/22 with Diflucan 150 mg by mouth every other day for 3 doses. She said she is better but irritation still present. She thinks her "pull up" is rubbing and causing irritation. She has Kenalog ointment on hand but was afraid to use if because she thought she should not use it every day. I told her if is okay to use it to help clear this irritation and encouraged her to do so.  The reason she called today because she is experiencing burning around her mouth and her tongue is sore with white coating at back of tongue.  She asked if Dr. Marguerita Merles would call something in.  I advised her Dr. Marguerita Merles might want her to see PCP. She said she has had stroke and is in wheelchair and hard to get to visits. I told her I would ask Dr. Marguerita Merles what she recommends.

## 2022-08-31 ENCOUNTER — Other Ambulatory Visit: Payer: Self-pay | Admitting: Family Medicine

## 2022-08-31 NOTE — Telephone Encounter (Signed)
Requesting: alprazolam  Contract: 03/06/19 UDS: 04/03/19 Last Visit: 06/07/22 Next Visit: 09/07/22 Last Refill: 02/22/22 (90,5)  Please Advise. Med pending

## 2022-09-01 ENCOUNTER — Telehealth: Payer: Self-pay | Admitting: *Deleted

## 2022-09-01 NOTE — Telephone Encounter (Signed)
Patient called stating she is still experiencing some burning and discomfort with the thrush in her mouth. Has appointment at 1600 tomorrow with PCP. RN advised patient to continue with nystatin oral suspension 4 times a day and keep appointment with PCP so she can mention symptoms to him. Patient agreeable and appreciative of phone call.   Encounter closed.

## 2022-09-02 ENCOUNTER — Ambulatory Visit: Payer: Medicare Other | Admitting: Family Medicine

## 2022-09-07 ENCOUNTER — Ambulatory Visit (INDEPENDENT_AMBULATORY_CARE_PROVIDER_SITE_OTHER): Payer: Medicare Other | Admitting: Family Medicine

## 2022-09-07 ENCOUNTER — Encounter: Payer: Self-pay | Admitting: Family Medicine

## 2022-09-07 ENCOUNTER — Other Ambulatory Visit: Payer: Self-pay | Admitting: Family Medicine

## 2022-09-07 VITALS — BP 142/72 | HR 87 | Temp 97.3°F | Ht 60.0 in | Wt 115.6 lb

## 2022-09-07 DIAGNOSIS — I6930 Unspecified sequelae of cerebral infarction: Secondary | ICD-10-CM

## 2022-09-07 DIAGNOSIS — F411 Generalized anxiety disorder: Secondary | ICD-10-CM

## 2022-09-07 DIAGNOSIS — M24561 Contracture, right knee: Secondary | ICD-10-CM

## 2022-09-07 DIAGNOSIS — E871 Hypo-osmolality and hyponatremia: Secondary | ICD-10-CM

## 2022-09-07 DIAGNOSIS — I10 Essential (primary) hypertension: Secondary | ICD-10-CM | POA: Diagnosis not present

## 2022-09-07 DIAGNOSIS — G894 Chronic pain syndrome: Secondary | ICD-10-CM | POA: Diagnosis not present

## 2022-09-07 MED ORDER — LISINOPRIL 10 MG PO TABS: 10.0000 mg | ORAL_TABLET | Freq: Every day | ORAL | 0 refills | Status: DC

## 2022-09-07 MED ORDER — FLUCONAZOLE 150 MG PO TABS: ORAL_TABLET | ORAL | 0 refills | Status: DC

## 2022-09-07 NOTE — Progress Notes (Signed)
OFFICE VISIT  09/07/2022  CC:  Chief Complaint  Patient presents with   Medical Management of Chronic Issues    Patient is a 77 y.o. female who presents accompanied by her son for 64-monthfollow-up chronic pain syndrome, hypertension, history of CVA with residual deficit, and anxiety. A/P as of last visit: "1 chronic pain syndrome: Right leg flexion contracture status post CVA.  This is likely reached its maximum improvement.  She will continue with AFO and periodic follow-up with Dr. STessa Lerner next appointment in January 2024. Continue Vicodin 5-3 25, 1 every 6 hours as needed. She uses a walker. Next DEXA due 03/2023.   #2 hypertension, well-controlled on lisinopril 5 mg a day. Electrolytes and creatinine today.   3.  Chronic hyponatremia. Electrolytes and creatinine today."  INTERIM HX: She is stable overall. No new weakness. Does not get out of the house much at all.  4 recent bp checks:  139-158 over 84-90, HR72-98.  Her pain level is unchanged (right ear, right lower leg, neck).  Still tends to underuse her hydrocodone. Chronic anxiety level unchanged.  Takes alprazolam 3 times a day. PMP AWARE reviewed today: most recent rx for Vicodin 5/325 was filled 01/19/2022, # 619 rx by me. Most recent alprazolam 0.5 mg prescription filled 08/31/2022, #90, prescription by me. No red flags.  ROS as above, plus--> no fevers, no CP, no SOB, no wheezing, no cough, no dizziness, no HAs, no rashes, no melena/hematochezia.  No polyuria or polydipsia.  No myalgias or arthralgias.  No focal weakness, paresthesias, or tremors.  No acute vision or hearing abnormalities.  No dysuria or unusual/new urinary urgency or frequency.  No recent changes in lower legs. No n/v/d or abd pain.  No palpitations.    Past Medical History:  Diagnosis Date   Allergic rhinitis    maple pollen   Anxiety    Arthritis    Bowel obstruction (HCC)    Chronic low back pain    Dr. PTrenton Gammon  Has had back injection--bp  elevated after.   Chronic pain syndrome    Chronic renal insufficiency, stage 2 (mild)    GFR 60-70   DDD (degenerative disc disease), cervical    Hx of ACDF (Dr. PAnnette Stable.  Followed by Dr. VLynann Bologna  Also, Dr. INamon Cirrido left C7-T1 intralaminal epidural injection.   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  Resid R arm and leg weakness + flexion contractures.   Hypercholesteremia    mild; pt declined statin trial 05/2014--needs recheck lipid panel at first f/u visit in 2017   Hypertension    LBBB (left bundle branch block)    Osteopenia 06/2017   T score -1.4 FRAX 15% / 2%   Ovarian cyst 2017   82019-robotic assisted bilatel SPO: all path benign.   Perianal dermatitis    prn cutivate   Phlebitis    Right hemiplegia ([place] 01/2020   R arm and leg (hemorrhagic L cerebral hemisphere CVA)   Right knee injury 2018   Patellofemoral crush injury--Dr. VLynann Bologna   Solitary thyroid nodule 05/2021   needs bx->plan endo ref   Stroke ([facility]    Syncope 10/2021   cardiac w/u as per Dr. TRadford Pax  Syncope    2023 (vasovagal suspected)   Trochanteric  bursitis of right hip    Recurrent (injection by Dr. 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 [name]). Recurrent fungal/bact OE.   Varicose vein    left leg     Past Surgical History:  Procedure Laterality Date   ABDOMINAL HYSTERECTOMY  03/2015   TAH.  Last pap 2016.  No hx of abnl paps.  Per GYN, no further pap smears indicated.   ANTERIOR AND POSTERIOR REPAIR N/A 03/18/2014   Procedure: Cystocele repair with graft, Vault suspension, Rectocele repair;  Surgeon: [name], MD;  [facility]: [facility];  Service: Urology;   Laterality: N/A;   BIOPSY THYROID  06/2021   benign   CHOLECYSTECTOMY     CHOLECYSTECTOMY, LAPAROSCOPIC     COLON SURGERY     COLONOSCOPY  04/2006; 05/26/16   2007 (Dr. [name]): Normal.  04/2016 (Dr. [name]) normal except diverticulosis and decreased anal sphincter tone.  No repeat colonoscopy is recommended due to age.   cspine surgery     Dr. Wiliam Ke level ant cerv discectomy [name] w/plating   CYSTO N/A 03/18/2014   Procedure: CYSTO;  Surgeon: [name], MD;  [facility];  Service: Urology;  Laterality: N/A;   DEXA  07/30/2015; 06/2018   2017 and 2020 -->Osteopenia--repeat 2 yrs. T score -2.29 Mar 2021. Rpt 2 yrs.   LYSIS OF ADHESION N/A 01/31/2018   Procedure: POSSIBLE LYSIS OF ADHESION;  Surgeon: Everitt Amber, MD;  [facility]: [facility];  Service: Gynecology;  Laterality: N/A;   [place]     with prolasped bledder repair   RESECTION OF COLON     BENIGN TUMOR   RIGHT EAR SURGERY  08/23/2017   Dr. 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  Procedure: XI ROBOTIC ASSISTED BILATERAL SALPINGO OOPHORECTOMY;  Surgeon: Everitt Amber, MD;  [facility]: [facility];  Service: Gynecology;  Laterality: Bilateral;   TRANSTHORACIC ECHOCARDIOGRAM  01/27/2020   2021 EF 65-70%, NORMAL. 12/01/21 grd I DD, o/w normal.   TUBAL LIGATION      Outpatient Medications Prior to Visit  Medication Sig Dispense Refill   acetaminophen (TYLENOL) 500 MG tablet Take 1 tablet (500 mg total) by mouth 3 (three) times daily.  30 tablet 0   ALPRAZolam (XANAX) 0.5 MG tablet TAKE 1 TABLET BY MOUTH THREE TIMES A DAY AS NEEDED FOR ANXIETY 90 tablet 1   aspirin EC 81 MG tablet Take 1 tablet (81 mg total) by mouth daily. Swallow whole. 30 tablet 11   Biotin 5 MG TABS Take 5 mg by mouth daily.     clobetasol (TEMOVATE) 0.05 % external solution Apply topically.     fluticasone (FLONASE) 50 MCG/ACT nasal spray Place 2 sprays into both nostrils at bedtime. 48 g 3   HYDROcodone-acetaminophen (NORCO/VICODIN) 5-325 MG tablet Take 1 tablet by mouth every 6 (six) hours as needed for moderate pain. 60 tablet 0   ibuprofen (ADVIL) 200 MG tablet Take 200 mg by mouth every 6 (six) hours as needed.  KLOR-CON M20 20 MEQ tablet TAKE 1 TABLET BY MOUTH EVERY DAY 90 tablet 0   loratadine (CLARITIN) 10 MG tablet Take 10 mg by mouth every other day.      meclizine (ANTIVERT) 12.5 MG tablet Take 1 tablet (12.5 mg total) by mouth 3 (three) times daily. 30 tablet 0   Multiple Minerals-Vitamins (CALCIUM-MAGNESIUM-ZINC-D3 PO) Take 1 tablet by mouth daily.      nystatin (MYCOSTATIN) 100000 UNIT/ML suspension Swish, gargle as long as possible, then swallow 5 mls po QID x 7 days. 140 mL 1   nystatin ointment (MYCOSTATIN) Apply 1 application topically 2 (two) times daily. Apply to affected area for up to 7 days. 30 g 0   polyethylene glycol (MIRALAX / GLYCOLAX) 17 g packet Take 17 g by mouth daily as needed.     Sodium Fluoride (CLINPRO 5000) 1.1 % PSTE Clinpro 5000 1.1 % dental paste     tiZANidine (ZANAFLEX) 2 MG tablet TAKE 1 TABLET BY MOUTH 3 TIMES DAILY AS NEEDED FOR MUSCLE RELAXATION 270 tablet 1   triamcinolone ointment (KENALOG) 0.1 %      verapamil (CALAN-SR) 180 MG CR tablet Take 1 tablet (180 mg total) by mouth daily. 90 tablet 3   lisinopril (ZESTRIL) 5 MG tablet TAKE 1 TABLET (5 MG TOTAL) BY MOUTH DAILY. 90 tablet 0   No facility-administered medications prior to visit.    Allergies  Allergen Reactions   Gabapentin Anxiety     Elevated heart rate and crazy dreams   Prednisone Anxiety and Other (See Comments)    REACTION: funny feeling    Review of Systems As per HPI  PE:    09/07/2022    1:07 PM 09/07/2022    1:03 PM 08/18/2022    1:33 PM  Vitals with BMI  Height  '5\' 0"'$    Weight  115 lbs 10 oz   BMI  [id number]   Systolic [id number]  Diastolic 72 82 70  Pulse  87 78  Rpt bp 142/72   Physical Exam  Gen: Alert, well appearing.  Patient is oriented to person, place, time, and situation. AFFECT: pleasant, lucid thought and speech. No further exam today.  LABS:  Last CBC Lab Results  Component Value Date   WBC 7.6 11/02/2021   HGB 12.5 11/02/2021   HCT 35.8 (L) 11/02/2021   MCV 89.9 11/02/2021   MCH 31.4 11/02/2021   RDW 12.4 11/02/2021   PLT 183 [id number]   Last metabolic panel Lab Results  Component Value Date   GLUCOSE 111 (H) 06/07/2022   NA 134 (L) 06/07/2022   K 4.7 06/07/2022   CL 98 06/07/2022   CO2 27 06/07/2022   BUN 13 06/07/2022   CREATININE 0.74 06/07/2022   GFRNONAA >60 11/02/2021   CALCIUM 9.9 06/07/2022   PROT 6.4 (L) 11/02/2021   ALBUMIN 3.7 11/02/2021   BILITOT 0.5 11/02/2021   ALKPHOS 66 11/02/2021   AST 25 11/02/2021   ALT 19 11/02/2021   ANIONGAP 7 11/02/2021   Last lipids Lab Results  Component Value Date   CHOL 168 11/11/2021   HDL 72.90 11/11/2021   LDLCALC 87 11/11/2021   LDLDIRECT 136.9 09/20/2007   TRIG 44.0 11/11/2021   CHOLHDL 2 11/11/2021   Last hemoglobin A1c Lab Results  Component Value Date   HGBA1C 5.0 01/28/2020   Last thyroid functions Lab Results  Component Value Date   TSH 1.08 09/25/2019   T3TOTAL 111 09/25/2019   T4TOTAL 8.1 11/21/2013  IMPRESSION AND PLAN:  1 chronic pain syndrome: Right leg flexion contracture status post CVA.  This has reached its maximum improvement.  She will continue with AFO and periodic follow-up with Dr. Tessa Lerner, next appointment in January 2024.  He has stopped Botox injections because they did  not help. Continue Vicodin 5-3 25, 1 every 6 hours as needed. She uses a walker. Next DEXA due 03/2023.   #2 hypertension, not well-controlled. Increase lisinopril to 10 mg a day. Basic metabolic panel today.   #3 chronic anxiety. Doing well long-term on Xanax 0.5 mg 3 times daily.  4.  Chronic hyponatremia. Electrolytes and creatinine today."  An After Visit Summary was printed and given to the patient.  FOLLOW UP: Return in about 2 weeks (around 09/21/2022) for f/u HTN.  Signed:  Crissie Sickles, MD           09/07/2022

## 2022-09-08 LAB — BASIC METABOLIC PANEL
BUN: 9 mg/dL (ref 6–23)
CO2: 26 mEq/L (ref 19–32)
Calcium: 10.1 mg/dL (ref 8.4–10.5)
Chloride: 96 mEq/L (ref 96–112)
Creatinine, Ser: 0.67 mg/dL (ref 0.40–1.20)
GFR: 84 mL/min (ref >60.00)
Glucose, Bld: 104 mg/dL — ABNORMAL HIGH (ref 70–99)
Potassium: 4.6 mEq/L (ref 3.5–5.1)
Sodium: 132 mEq/L — ABNORMAL LOW (ref 135–145)

## 2022-09-15 ENCOUNTER — Telehealth: Payer: Self-pay

## 2022-09-15 NOTE — Telephone Encounter (Signed)
Scheduled

## 2022-09-15 NOTE — Telephone Encounter (Signed)
Patient states she is a lot of pain in her vagina.  Requesting a refill of medication.  She states she cannot wait for her f/u appt next week.  Please advise.  [phone].  [facility] [place]  fluconazole (DIFLUCAN) 150 MG tablet

## 2022-09-15 NOTE — Telephone Encounter (Signed)
Did she take the Diflucan pills that I sent to [place] on 09/07/2022?

## 2022-09-15 NOTE — Telephone Encounter (Signed)
Please Advise on both messages below.

## 2022-09-15 NOTE — Telephone Encounter (Signed)
Patient called back and is requesting medication for her throat. Or if there is something that works faster and better than the suspension.  nystatin (MYCOSTATIN) 100000 UNIT/ML suspension   If she needs an appt, please call her as soon as possible so she can get transportation worked out. There are no openings left for this afternoon, but Dr. Anitra Lauth has available appts tomorrow, currently. Sent as high priority in case he requests pt to come in tomorrow.

## 2022-09-15 NOTE — Telephone Encounter (Signed)
She took Diflucan and meds rx by GYN. She is still experiencing burning and white on her tongue and all on the back of her throat.

## 2022-09-15 NOTE — Telephone Encounter (Signed)
Needs appointment

## 2022-09-16 ENCOUNTER — Ambulatory Visit (INDEPENDENT_AMBULATORY_CARE_PROVIDER_SITE_OTHER): Payer: Medicare Other | Admitting: Family Medicine

## 2022-09-16 ENCOUNTER — Telehealth: Payer: Self-pay

## 2022-09-16 ENCOUNTER — Encounter: Payer: Self-pay | Admitting: Family Medicine

## 2022-09-16 VITALS — BP 132/75 | HR 90 | Temp 97.7°F | Ht 60.0 in | Wt 117.0 lb

## 2022-09-16 DIAGNOSIS — I10 Essential (primary) hypertension: Secondary | ICD-10-CM

## 2022-09-16 DIAGNOSIS — I6930 Unspecified sequelae of cerebral infarction: Secondary | ICD-10-CM

## 2022-09-16 DIAGNOSIS — G8111 Spastic hemiplegia affecting right dominant side: Secondary | ICD-10-CM | POA: Diagnosis not present

## 2022-09-16 DIAGNOSIS — K146 Glossodynia: Secondary | ICD-10-CM | POA: Diagnosis not present

## 2022-09-16 MED ORDER — NYSTATIN 100000 UNIT/ML MT SUSP: 10.0000 mL | Freq: Four times a day (QID) | OROMUCOSAL | 1 refills | Status: DC

## 2022-09-16 NOTE — Progress Notes (Signed)
OFFICE VISIT  09/16/2022  CC:  Chief Complaint  Patient presents with   Aliene Altes Diflucan 5 day course, took a few does of Nystatin mouthwash. Still has burning and white spots on her tongue.    Patient is a 76 y.o. female who presents accompanied by her sister for mouth discomfort, follow-up hypertension, and mobility evaluation.  HPI: Tongue burning, question of white plaque in mouth. She saw her GYN MD on 08/18/2022 and was prescribed a 3 dose regimen of Diflucan for potential yeast vaginitis, onset of symptoms was after a round of antibiotics for suspected UTI (no growth). Then, on 08/29/2022 she was prescribed nystatin by her GYN MD due to patient concern for thrush. I saw her here 9 days ago and prescribed 5-day course of Diflucan.  Reviewed the last 1 wk of home bps:  avg 125/70  Mobility evaluation:  1) This patient has the following mobility-related ADL(s) that will be significantly improved in the home: Maneuvering/transport through her house greater than 100 feet to utilize the kitchen in the bathroom. Particularly to reach her bathroom in a timely manner in order to avoid episodes of complete urinary incontinence.  2) A cane or walker will not allow for improvement in mobility-related ADL's in home because: She has minimal ability to ambulate due to spastic right hemiparesis.  3) A manual wheelchair will not allow for improvement in mobility-related ADL's in home because: Difficulty propelling herself due to spastic hemiparesis.   (see objective measurements below)  4) A scooter will not allow for improvement in mobility-related ADL's in home because: Patient has axial/trunk weakness and poor balance and would be unable to sit in a stable way on a scooter.  5) In my opinion, this patient can physically and mentally operate the power wheelchair safely.  6) Physical exam:  UE strength: Right 4 out of 5 proximal, 5 out of 5 distal.  Left 5/5 prox and dist.                       UE ROM: Right shoulder 80 degrees forward flexion, 10 degrees extension, 70 degrees abduction, 25 degrees external rotation, 45 degrees internal rotation.  Left shoulder 90 degrees forward flexion, 25 degrees extension, 90 degrees of abduction, 90 degree external rotation and internal rotation. Mild right arm flexion contracture at the elbow, Both elbows with full flexion and extension and pronation and supination.  R wrist with some rigidity and impaired ROM in all motions.  L wrist full ROM. LE strength: Left 5 out of 5 proximally and distally Right 3 out of 5 proximally and distally.                         LE ROM:   15 degrees of right hip flexion and extension.  She cannot AB duct or adduct her right hip with any significant strength.  Right knee flexion contracture to 30 degrees. Right ankle flexion contracture to 45 degrees. Left knee full range of motion, left ankle full range of motion.  Distance with mobility aide: 20 feet with walker.  40 feet with rolling walker  Oxygen SAT levels:  98%  Overall pain scale:  6/10  Patient's weight:  117 lbs  Past Medical History:  Diagnosis Date   Allergic rhinitis    maple pollen   Anxiety    Arthritis    Bowel obstruction (HCC)    Chronic low back pain  Dr. Trenton Gammon.  Has had back injection--bp elevated after.   Chronic pain syndrome    Chronic renal insufficiency, stage 2 (mild)    GFR 60-70   DDD (degenerative disc disease), cervical    Hx of ACDF (Dr. [name]).  Followed by Dr. [name].  Also, Dr. [name] do left C7-T1 intralaminal epidural injection.   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  Resid R arm and leg weakness + flexion contractures.   Hypercholesteremia    mild; pt  declined statin trial 05/2014--needs recheck lipid panel at first f/u visit in 2017   Hypertension    LBBB (left bundle branch block)    Osteopenia 06/2017   T score -1.4 FRAX 15% / 2%   Ovarian cyst 2017   82019-robotic assisted bilatel SPO: all path benign.   Perianal dermatitis    prn cutivate   Phlebitis    Right hemiplegia ([place]) 01/2020   R arm and leg (hemorrhagic L cerebral hemisphere CVA)   Right knee injury 2018   Patellofemoral crush injury--Dr. [name].   Solitary thyroid nodule 05/2021   needs bx->plan endo ref   Stroke [facility])    Syncope 10/2021   cardiac w/u as per Dr. Radford Pax   Syncope    2023 (vasovagal suspected)   Trochanteric bursitis of right hip    Recurrent (injection by Dr. 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 [name]). Recurrent fungal/bact OE.   Varicose vein    left leg     Past Surgical History:  Procedure Laterality Date   ABDOMINAL HYSTERECTOMY  03/2015   TAH.  Last pap 2016.  No hx of abnl paps.  Per GYN, no further pap smears indicated.   ANTERIOR AND POSTERIOR REPAIR N/A 03/18/2014   Procedure: Cystocele repair with graft, Vault suspension, Rectocele repair;  Surgeon: [name], MD;  [facility];  Service: Urology;  Laterality: N/A;   BIOPSY THYROID  06/2021   benign   CHOLECYSTECTOMY     CHOLECYSTECTOMY, LAPAROSCOPIC     COLON SURGERY     COLONOSCOPY  04/2006; 05/26/16   2007 (Dr. [name]): Normal.  04/2016 (Dr. [name]) normal except diverticulosis and decreased anal sphincter tone.  No repeat colonoscopy is recommended due to age.   cspine surgery     Dr. Wiliam Ke level ant cerv discectomy [name] w/plating   CYSTO N/A 03/18/2014   Procedure: CYSTO;  Surgeon: [name], MD;  [facility];  Service: Urology;  Laterality: N/A;   DEXA  07/30/2015; 06/2018   2017 and 2020 -->Osteopenia--repeat 2 yrs. T score -2.29 Mar 2021. Rpt 2  yrs.   LYSIS OF ADHESION N/A 01/31/2018   Procedure: POSSIBLE LYSIS OF ADHESION;  Surgeon: Everitt Amber, MD;  [facility];  Service: Gynecology;  Laterality: N/A;   [place]     with prolasped bledder repair   RESECTION OF COLON     BENIGN TUMOR   RIGHT EAR SURGERY  08/23/2017   Dr. 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  Procedure: XI ROBOTIC ASSISTED BILATERAL SALPINGO OOPHORECTOMY;  Surgeon: Everitt Amber, MD;  Location[facility];  Service: Gynecology;  Laterality: Bilateral;   TRANSTHORACIC ECHOCARDIOGRAM  01/27/2020   2021 EF 65-70%, NORMAL. 12/01/21 grd I DD, o/w normal.   TUBAL LIGATION      Outpatient Medications Prior to Visit  Medication Sig Dispense Refill   acetaminophen (TYLENOL) 500 MG tablet Take 1 tablet (500 mg total) by mouth 3 (three) times daily. 30 tablet 0   ALPRAZolam (XANAX) 0.5 MG tablet TAKE 1 TABLET BY MOUTH THREE TIMES A DAY AS NEEDED FOR ANXIETY 90 tablet 1   aspirin EC 81 MG tablet Take 1 tablet (81 mg total) by mouth daily. Swallow whole. 30 tablet 11   Biotin 5 MG TABS Take 5 mg by mouth daily.     clobetasol (TEMOVATE) 0.05 % external solution Apply topically.     fluticasone (FLONASE) 50 MCG/ACT nasal spray Place 2 sprays into both nostrils at bedtime. 48 g 3   HYDROcodone-acetaminophen (NORCO/VICODIN) 5-325 MG tablet Take 1 tablet by mouth every 6 (six) hours as needed for moderate pain. 60 tablet 0   ibuprofen (ADVIL) 200 MG tablet Take 200 mg by mouth every 6  (six) hours as needed.     KLOR-CON M20 20 MEQ tablet TAKE 1 TABLET BY MOUTH EVERY DAY 90 tablet 0   lisinopril (ZESTRIL) 10 MG tablet Take 1 tablet (10 mg total) by mouth daily. 30 tablet 0   loratadine (CLARITIN) 10 MG tablet Take 10 mg by mouth every other day.      meclizine (ANTIVERT) 12.5 MG tablet Take 1 tablet (12.5 mg total) by mouth 3 (three) times daily. 30 tablet 0   Multiple Minerals-Vitamins (CALCIUM-MAGNESIUM-ZINC-D3 PO) Take 1 tablet by mouth daily.      nystatin ointment (MYCOSTATIN) Apply 1 application topically 2 (two) times daily. Apply to affected area for up to 7 days. 30 g 0   pantoprazole (PROTONIX) 40 MG tablet TAKE 1 TABLET BY MOUTH EVERYDAY AT BEDTIME 90 tablet 1   polyethylene glycol (MIRALAX / GLYCOLAX) 17 g packet Take 17 g by mouth daily as needed.     Sodium Fluoride (CLINPRO 5000) 1.1 % PSTE Clinpro 5000 1.1 % dental paste     tiZANidine (ZANAFLEX) 2 MG tablet TAKE 1 TABLET BY MOUTH 3 TIMES DAILY AS NEEDED FOR MUSCLE RELAXATION 270 tablet 1   triamcinolone ointment (KENALOG) 0.1 %      verapamil (CALAN-SR) 180 MG CR tablet Take 1 tablet (180 mg total) by mouth daily. 90 tablet 3   fluconazole (DIFLUCAN) 150 MG tablet 1 tab po qd x 5d 5 tablet 0   nystatin (MYCOSTATIN) 100000 UNIT/ML suspension Swish, gargle as long as possible, then swallow 5 mls po QID x 7 days. 140 mL 1   No facility-administered medications prior to visit.    Allergies  Allergen Reactions   Gabapentin Anxiety    Elevated heart rate and crazy dreams   Prednisone Anxiety and Other (See Comments)    REACTION: funny feeling    Review of Systems  As per HPI  PE:    09/16/2022   10:52 AM 09/07/2022    1:07 PM 09/07/2022    1:03  PM  Vitals with BMI  Height 5\' 0"   5\' 0"   Weight 117 lbs  115 lbs 10 oz  BMI 123456  [id number]  Systolic [id number] [id number]  Diastolic 75 72 82  Pulse 90  87    Physical Exam  General: Alert, sitting up in wheelchair. Oral cavity without erythema or plaque or  focal lesion.  Palate and pharynx normal.  Tongue without any significant plaque or abnormal fissuring.  No swelling in the mouth.   LABS:  Last metabolic panel Lab Results  Component Value Date   GLUCOSE 104 (H) 09/07/2022   NA 132 (L) 09/07/2022   K 4.6 09/07/2022   CL 96 09/07/2022   CO2 26 09/07/2022   BUN 9 09/07/2022   CREATININE 0.67 09/07/2022   GFRNONAA >60 11/02/2021   CALCIUM 10.1 09/07/2022   PROT 6.4 (L) 11/02/2021   ALBUMIN 3.7 11/02/2021   BILITOT 0.5 11/02/2021   ALKPHOS 66 11/02/2021   AST 25 11/02/2021   ALT 19 11/02/2021   ANIONGAP 7 11/02/2021   IMPRESSION AND PLAN:  1) burning tongue. She may have had thrush at 1 point in time but she does not now Magic mouthwash prescribed: Pharmacy did not have the full formulation of Maalox, Benadryl, nystatin, and lidocaine.  We were able to get a Benadryl and lidocaine mixture and she will take 10 mL to swish and spit 4 times a day.  #2 hypertension, well-controlled on lisinopril 10 mg a day.  3.  Mobility evaluation. History of CVA with residual deficit--> right spastic hemiparesis. Walker and manual walker are insufficient for adequate safe mobility in her home or outside of home. Will order electric wheelchair.  An After Visit Summary was printed and given to the patient.  FOLLOW UP: Return in about 3 months (around 12/17/2022) for routine chronic illness f/u.  Signed:  Crissie Sickles, MD           09/16/2022

## 2022-09-16 NOTE — Telephone Encounter (Signed)
[  11:25 AM] McGowen, [facility] do order for electric WC for [place] --thanks

## 2022-09-20 DIAGNOSIS — H7291 Unspecified perforation of tympanic membrane, right ear: Secondary | ICD-10-CM | POA: Diagnosis not present

## 2022-09-20 DIAGNOSIS — H90A31 Mixed conductive and sensorineural hearing loss, unilateral, right ear with restricted hearing on the contralateral side: Secondary | ICD-10-CM | POA: Diagnosis not present

## 2022-09-20 DIAGNOSIS — H6121 Impacted cerumen, right ear: Secondary | ICD-10-CM | POA: Diagnosis not present

## 2022-09-20 DIAGNOSIS — H938X1 Other specified disorders of right ear: Secondary | ICD-10-CM | POA: Diagnosis not present

## 2022-09-20 DIAGNOSIS — H90A22 Sensorineural hearing loss, unilateral, left ear, with restricted hearing on the contralateral side: Secondary | ICD-10-CM | POA: Diagnosis not present

## 2022-09-21 ENCOUNTER — Ambulatory Visit: Payer: Medicare Other | Admitting: Family Medicine

## 2022-09-30 ENCOUNTER — Other Ambulatory Visit: Payer: Self-pay | Admitting: Family Medicine

## 2022-10-03 DIAGNOSIS — M26621 Arthralgia of right temporomandibular joint: Secondary | ICD-10-CM | POA: Diagnosis not present

## 2022-10-04 ENCOUNTER — Other Ambulatory Visit: Payer: Self-pay | Admitting: Family Medicine

## 2022-10-18 ENCOUNTER — Telehealth: Payer: Self-pay

## 2022-10-18 DIAGNOSIS — H01001 Unspecified blepharitis right upper eyelid: Secondary | ICD-10-CM | POA: Diagnosis not present

## 2022-10-18 DIAGNOSIS — H00011 Hordeolum externum right upper eyelid: Secondary | ICD-10-CM | POA: Diagnosis not present

## 2022-10-18 DIAGNOSIS — H01004 Unspecified blepharitis left upper eyelid: Secondary | ICD-10-CM | POA: Diagnosis not present

## 2022-10-18 DIAGNOSIS — H0014 Chalazion left upper eyelid: Secondary | ICD-10-CM | POA: Diagnosis not present

## 2022-10-18 NOTE — Telephone Encounter (Signed)
Patient called to request an appt today with Dr. Milinda Cave.  She said both eyes hurt, "gunk" coming out of both eyes.  I told patient that Dr. Milinda Cave did not have any openings today.  She asked if she could call her eye doctor.  I said yes.  Patient called back and stated that eye doctor can see her today.  She will him send Dr. Milinda Cave office notes after her visit today.

## 2022-10-18 NOTE — Telephone Encounter (Signed)
Noted. Sent as fyi

## 2022-10-24 ENCOUNTER — Telehealth: Payer: Self-pay | Admitting: Family Medicine

## 2022-10-24 MED ORDER — MECLIZINE HCL 12.5 MG PO TABS: 12.5000 mg | ORAL_TABLET | Freq: Three times a day (TID) | ORAL | 0 refills | Status: DC

## 2022-10-24 NOTE — Telephone Encounter (Signed)
Meclizine sent.

## 2022-10-24 NOTE — Telephone Encounter (Signed)
Alison Cuevas has called her pharmacy and was advised to call us for a medication refill. The medication is Meclizine due to having recent dizzy spells.  Please let the patient know if this able to be called in or if an appointment is needed.

## 2022-10-24 NOTE — Telephone Encounter (Signed)
Pt requesting refill for Meclizine, last refill given by Delle Reining, PA on 02/19/20.   Please fill, if appropriate.

## 2022-10-26 ENCOUNTER — Encounter: Payer: Self-pay | Admitting: Physical Medicine & Rehabilitation

## 2022-10-26 ENCOUNTER — Encounter: Payer: Medicare Other | Attending: Physical Medicine & Rehabilitation | Admitting: Physical Medicine & Rehabilitation

## 2022-10-26 VITALS — BP 148/76 | HR 74

## 2022-10-26 DIAGNOSIS — G8111 Spastic hemiplegia affecting right dominant side: Secondary | ICD-10-CM | POA: Insufficient documentation

## 2022-10-26 MED ORDER — TIZANIDINE HCL 2 MG PO TABS: 2.0000 mg | ORAL_TABLET | Freq: Three times a day (TID) | ORAL | 5 refills | Status: DC

## 2022-10-26 NOTE — Progress Notes (Signed)
Subjective:    Patient ID: Alison Cuevas, female    DOB: [birth year], 77 y.o.   MRN: [id number]  HPI  Alison Cuevas is here in follow up of her right sided spastic hemiparesis. I last saw her in January. She continues to complain of pain in her right lower leg and foot. She is also having symptoms in her RUE. She describes it as burning and stinging at times. The leg feels tight. She wears her JAS splint but doesn't always have it on to the point where it's really pulling on her heel cord. The patient takes tizanidine in the morning but typically that's about it. She uses xanax for anxiety. I had offered tegretol for nerve pain, but she has yet to ever use it. Her son continues to ask her to come live with him, but Alison Cuevas has refused saying she has enough help at home.   In the meantime, Alison Cuevas asks if there is still any hope for her.   Pain Inventory Average Pain 7 Pain Right Now 6 My pain is sharp, burning, dull, and tingling  In the last 24 hours, has pain interfered with the following? General activity 7 Relation with others 7 Enjoyment of life 7 What TIME of day is your pain at its worst? evening Sleep (in general) Poor  Pain is worse with: walking Pain improves with: pacing activities and medication Relief from Meds:  6  Family History  Problem Relation Age of Onset   Thyroid disease Mother    Hypertension Mother    Diverticulitis Mother    Stomach cancer Father    Heart attack Father    Heart failure Father    Breast cancer Sister 37   Lupus Sister    Melanoma Brother    Prostate cancer Brother    Leukemia Brother    Lung disease Brother    Other Other        Family member with MGUS   Social History   Socioeconomic History   Marital status: Widowed    Spouse name: Iona Beard   Number of children: 3   Years of education: Not on file   Highest education level: 8th grade  Occupational History   Occupation: retired  Tobacco Use   Smoking status:  Never   Smokeless tobacco: Never  Vaping Use   Vaping Use: Never used  Substance and Sexual Activity   Alcohol use: No    Alcohol/week: 0.0 standard drinks of alcohol   Drug use: No   Sexual activity: Not Currently    Partners: Male    Birth control/protection: Surgical    Comment: hysterectomy  Other Topics Concern   Not on file  Social History Narrative   Widow (2022), has 3 children (one lives near her).  Has 8 grandchildren, 2 greatgrandchildren.  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  No alcohol.  No drugs.Exercise: clean, work in yard.  10 siblings in all--5 have passed away--1 child age 41 with whooping cough , 1 lupus, 1 melanoma,1 lung cancer, 1 pulm fibrosis   Social Determinants of Health   Financial Resource Strain: Low Risk  (07/20/2022)   Overall Financial Resource Strain (CARDIA)    Difficulty of Paying Living Expenses: Not hard at all  Food Insecurity: No Food Insecurity (07/20/2022)   Hunger Vital Sign    Worried About Running Out of Food in the Last Year: Never true    Ran Out of  Food in the Last Year: Never true  Transportation Needs: Unmet Transportation Needs (07/20/2022)   PRAPARE - Transportation    Lack of Transportation (Medical): No    Lack of Transportation (Non-Medical): Yes  Physical Activity: Sufficiently Active (07/20/2022)   Exercise Vital Sign    Days of Exercise per Week: 7 days    Minutes of Exercise per Session: 60 min  Stress: No Stress Concern Present (07/20/2022)   [employer] of Occupational Health - Occupational Stress Questionnaire    Feeling of Stress : Only a little  Social Connections: Moderately Isolated (07/20/2022)   Social Connection and Isolation Panel [NHANES]    Frequency of Communication with Friends and Family: More than three times a week    Frequency of Social Gatherings with Friends and Family: More than three times a week    Attends Religious  Services: 1 to 4 times per year    Active Member of [employer] or Organizations: No    Attends Banker Meetings: Never    Marital Status: Widowed   Past Surgical History:  Procedure Laterality Date   ABDOMINAL HYSTERECTOMY  03/2015   TAH.  Last pap 2016.  No hx of abnl paps.  Per GYN, no further pap smears indicated.   ANTERIOR AND POSTERIOR REPAIR N/A 03/18/2014   Procedure: Cystocele repair with graft, Vault suspension, Rectocele repair;  Surgeon: Martina Sinner, MD;  Location: [facility];  Service: Urology;  Laterality: N/A;   BIOPSY THYROID  06/2021   benign   CHOLECYSTECTOMY     CHOLECYSTECTOMY, LAPAROSCOPIC     COLON SURGERY     COLONOSCOPY  04/2006; 05/26/16   2007 (Dr. Jarold Motto): Normal.  04/2016 (Dr. Leone Payor) normal except diverticulosis and decreased anal sphincter tone.  No repeat colonoscopy is recommended due to age.   cspine surgery     Dr. Estelle Grumbles level ant cerv discectomy Lavenia Atlas w/plating   CYSTO N/A 03/18/2014   Procedure: CYSTO;  Surgeon: Martina Sinner, MD;  Location: [facility];  Service: Urology;  Laterality: N/A;   DEXA  07/30/2015; 06/2018   2017 and 2020 -->Osteopenia--repeat 2 yrs. T score -2.29 Mar 2021. Rpt 2 yrs.   LYSIS OF ADHESION N/A 01/31/2018   Procedure: POSSIBLE LYSIS OF ADHESION;  Surgeon: Adolphus Birchwood, MD;  Location: [facility];  Service: Gynecology;  Laterality: N/A;   RECTOCELE REPAIR     with prolasped bledder repair   RESECTION OF COLON     BENIGN TUMOR   RIGHT EAR SURGERY  08/23/2017   Dr. 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  Procedure: XI ROBOTIC ASSISTED BILATERAL SALPINGO OOPHORECTOMY;  Surgeon: Adolphus Birchwood, MD;  Location: [facility];  Service: Gynecology;  Laterality: Bilateral;   TRANSTHORACIC ECHOCARDIOGRAM  01/27/2020   2021 EF 65-70%, NORMAL. 12/01/21 grd I DD, o/w normal.   TUBAL LIGATION     Past Surgical History:  Procedure Laterality Date   ABDOMINAL HYSTERECTOMY  03/2015   TAH.  Last pap 2016.  No hx of abnl paps.  Per GYN, no further pap smears indicated.  ANTERIOR AND POSTERIOR REPAIR N/A 03/18/2014   Procedure: Cystocele repair with graft, Vault suspension, Rectocele repair;  Surgeon: Martina Sinner, MD;  Location: [facility];  Service: Urology;  Laterality: N/A;   BIOPSY THYROID  06/2021   benign   CHOLECYSTECTOMY     CHOLECYSTECTOMY, LAPAROSCOPIC     COLON SURGERY     COLONOSCOPY  04/2006; 05/26/16   2007 (Dr. Jarold Motto): Normal.  04/2016 (Dr. Leone Payor) normal except diverticulosis and decreased anal sphincter tone.  No repeat colonoscopy is recommended due to age.   cspine surgery     Dr. Estelle Grumbles level ant cerv discectomy Lavenia Atlas w/plating   CYSTO N/A 03/18/2014   Procedure: CYSTO;  Surgeon: Martina Sinner, MD;  Location: [facility];  Service: Urology;  Laterality: N/A;   DEXA  07/30/2015; 06/2018   2017 and 2020 -->Osteopenia--repeat 2 yrs. T score -2.29 Mar 2021. Rpt 2 yrs.   LYSIS OF ADHESION N/A 01/31/2018   Procedure: POSSIBLE LYSIS OF ADHESION;  Surgeon: Adolphus Birchwood, MD;  Location: [facility];  Service: Gynecology;  Laterality: N/A;   RECTOCELE REPAIR     with prolasped bledder repair   RESECTION OF COLON     BENIGN TUMOR   RIGHT EAR SURGERY  08/23/2017   Dr. 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  Procedure: XI ROBOTIC ASSISTED BILATERAL SALPINGO OOPHORECTOMY;  Surgeon: Adolphus Birchwood, MD;  Location: [facility];  Service: Gynecology;  Laterality: Bilateral;   TRANSTHORACIC ECHOCARDIOGRAM  01/27/2020   2021 EF 65-70%, NORMAL. 12/01/21 grd I DD, o/w normal.   TUBAL LIGATION     Past Medical History:  Diagnosis Date   Allergic rhinitis    maple pollen   Anxiety    Arthritis    Bowel obstruction (HCC)    Chronic low back pain    Dr. Dutch Quint.  Has had back injection--bp elevated after.   Chronic pain syndrome    Chronic renal insufficiency, stage 2 (mild)    GFR 60-70   DDD (degenerative disc disease), cervical    Hx of ACDF (Dr. Jordan Likes).  Followed by Dr. Charlett Blake.  Also, Dr. Vickki Hearing do left C7-T1 intralaminal epidural injection.   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  Resid R arm and leg weakness + flexion contractures.   Hypercholesteremia    mild; pt declined statin trial 05/2014--needs recheck lipid panel at first f/u visit in 2017   Hypertension    LBBB (left bundle branch block)    Osteopenia 06/2017   T score -1.4 FRAX 15% / 2%   Ovarian cyst 2017   82019-robotic assisted bilatel SPO: all path benign.   Perianal dermatitis    prn cutivate   Phlebitis    Right hemiplegia (HCC) 01/2020   R arm and leg (hemorrhagic L cerebral hemisphere CVA)   Right knee injury 2018  Patellofemoral crush injury--Dr. Charlett Blake.   Solitary thyroid nodule 05/2021   needs bx->plan  endo ref   Stroke [facility])    Syncope 10/2021   cardiac w/u as per Dr. Mayford Knife   Syncope    2023 (vasovagal suspected)   Trochanteric bursitis of right hip    Recurrent (injection by Dr. Charlett Blake 05/26/15)   Tympanic membrane rupture, right 12/01/2016   As of 08/2018 pt set for tympanomastoidectomy and STSG (Dr. Ignacia Felling). Recurrent fungal/bact OE.   Varicose vein    left leg    BP (!) 148/76   Pulse 74   LMP 06/27/1972 Comment: not sexually active  SpO2 96%   Opioid Risk Score:   Fall Risk Score:  `1  Depression screen PHQ 2/9     07/20/2022    1:21 PM 07/13/2022    1:39 PM 05/12/2022   11:57 AM 04/13/2022    3:10 PM 01/26/2022    2:58 PM 11/11/2021    1:16 PM 11/10/2021    3:29 PM  Depression screen PHQ 2/9  Decreased Interest 0 1 0 0 0 0 0  Down, Depressed, Hopeless 0 1 0 0 0 0 0  PHQ - 2 Score 0 2 0 0 0 0 0     Review of Systems     Objective:   Physical Exam  General: No acute distress HEENT: NCAT, EOMI, oral membranes moist Cards: reg rate  Chest: normal effort Abdomen: Soft, NT, ND Skin: dry, intact Extremities: no edema Psych: pleasant and appropriate, anxious Neuro: Alert and oriented x 3. Normal insight and awareness. Intact Memory. Normal language and speech. Cranial nerve exam unremarkable. RUE 4+/5 with minimal resting tone.Marland Kitchen RLE 4/5 except for right heel cord tightness Tone 2-3/4 Right gastrocs and tib posterior are tight in addition to EHL  . Could stretch to neutra with force. RUE with trace flexor tone. Heel cord tight Musculoskeletal: minimal pain with ROM,          Assessment & Plan:  1. 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             -ongoing dialogue today regarding her spasticity/contractures              -also spoke about the fact that she needs more help at home than she has. Her son has offered for her to stay with him. She has continued to refuse. 2.     HTN:                per primary          3. Spasticity:             -ongoing tone RLE, really with contacture             -still some extensor tone/varus in right heel and foot             -recommend changing tizanidine to 2mg  q8 hours scheduled. If she's only taking this in the morning "prn" then she's really not going to get a benefit from it. I made the change to rx today.   -dysport injections 800u to right plantar flexors, inverters of right foot  -discussed brace options again and why the one her friend sent her a picture of is not appropriate for  her needs.  -JAS needs to be put on with full tension on heel cord   4. Neuropathic pain:             -carbamezipine trial has been offered. She never took   6. Mild intentional tremor:             -likely related to fatigue in stroke-affected RUE             - it appears mild and is not functionally impairing                   20+ minutes of face to face patient care time were spent during this visit. All questions were encouraged and answered.  f/u in a month for potential dysport injections

## 2022-10-26 NOTE — Patient Instructions (Signed)
ALWAYS FEEL FREE TO CALL OUR OFFICE WITH ANY PROBLEMS OR QUESTIONS [phone])  **PLEASE NOTE** ALL MEDICATION REFILL REQUESTS (INCLUDING CONTROLLED SUBSTANCES) NEED TO BE MADE AT LEAST 7 DAYS PRIOR TO REFILL BEING DUE. ANY REFILL REQUESTS INSIDE THAT TIME FRAME MAY RESULT IN DELAYS IN RECEIVING YOUR PRESCRIPTION.    PLEASE TAKE TIZANIDINE 2MG  THREE X DAILY---MORNING---MIDDAY--BEDTIME

## 2022-10-27 ENCOUNTER — Other Ambulatory Visit: Payer: Self-pay | Admitting: Family Medicine

## 2022-10-27 ENCOUNTER — Telehealth: Payer: Self-pay

## 2022-10-27 NOTE — Telephone Encounter (Signed)
Patient called stating she can't take the Tizanidine 2mg  at bedtime because she has bad dreams, she is fine with taking the two during the day

## 2022-10-27 NOTE — Telephone Encounter (Signed)
Please Advise

## 2022-10-27 NOTE — Telephone Encounter (Signed)
Patient states her body hurts all over, no fever, headache. She can't come in today for appt. Dr. Milinda Cave has 1 opening tomorrow at 4pm, but she wont have transportation then.  Please call patient.  Can we work her in the morning maybe? Or should she try to go somewhere else?

## 2022-10-28 NOTE — Telephone Encounter (Signed)
Pt.notified

## 2022-10-30 IMAGING — MG MM DIGITAL SCREENING BILAT W/ TOMO AND CAD
6 of 10 series · 6 of 30 positions shown · non-contrast
Comparison: Previous exam(s).

CLINICAL DATA: Screening.

EXAM:
DIGITAL SCREENING BILATERAL MAMMOGRAM WITH TOMOSYNTHESIS AND CAD
TECHNIQUE: Bilateral screening digital craniocaudal and mediolateral oblique
mammograms were obtained. Bilateral screening digital breast
tomosynthesis was performed. The images were evaluated with
computer-aided detection.

[R CC synth-2D]
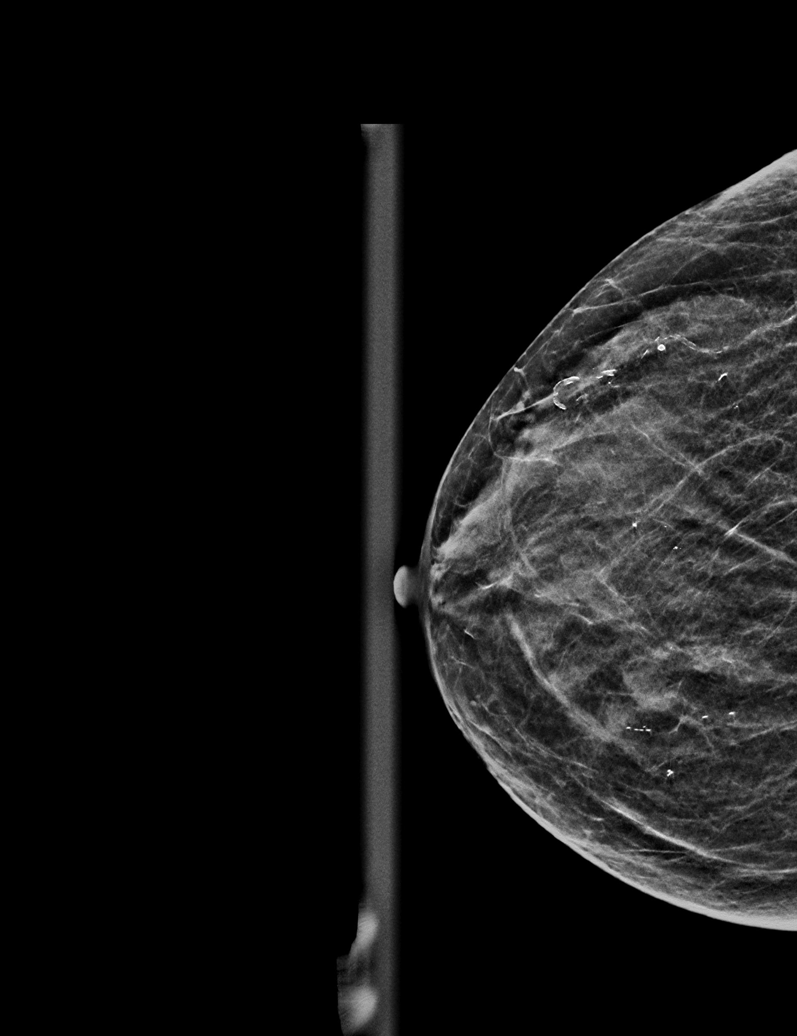

[L MLO synth-2D (1 of 2)]
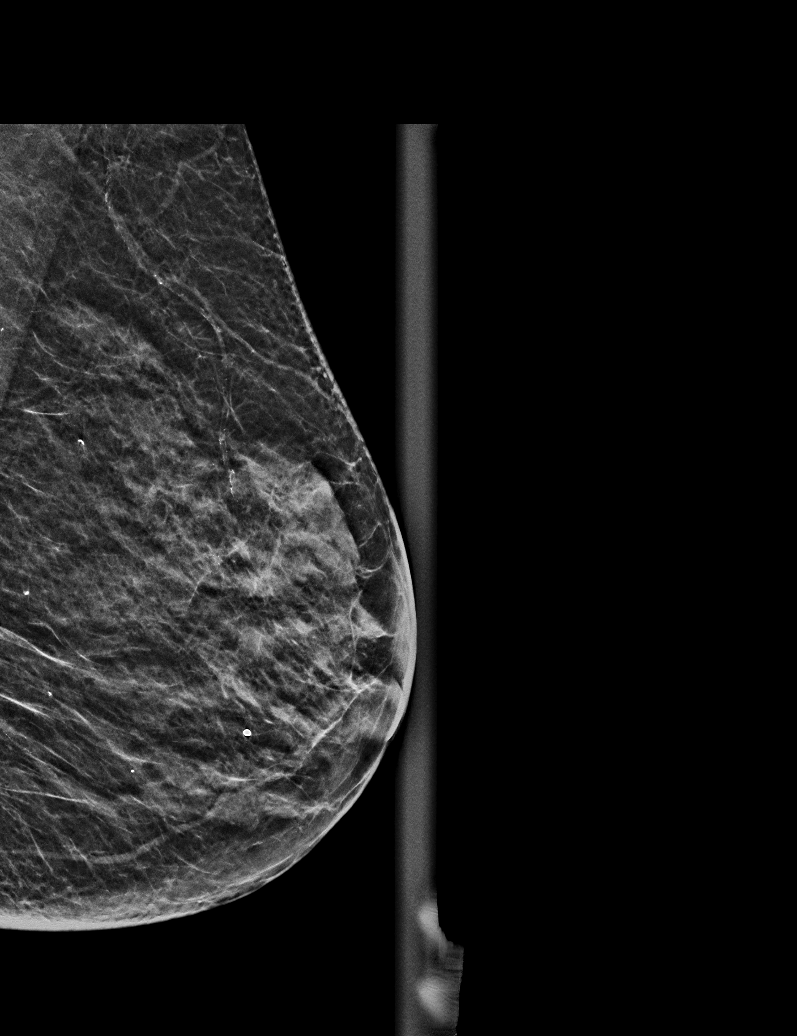

[L CC synth-2D]
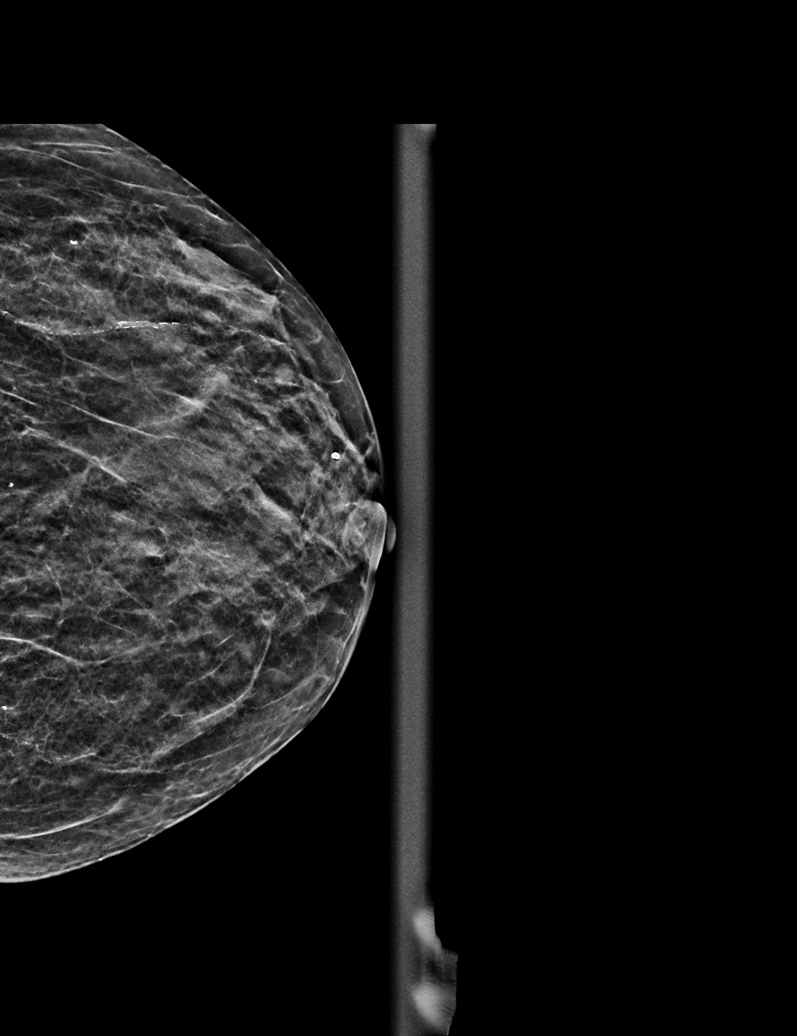

[L MLO synth-2D (2 of 2)]
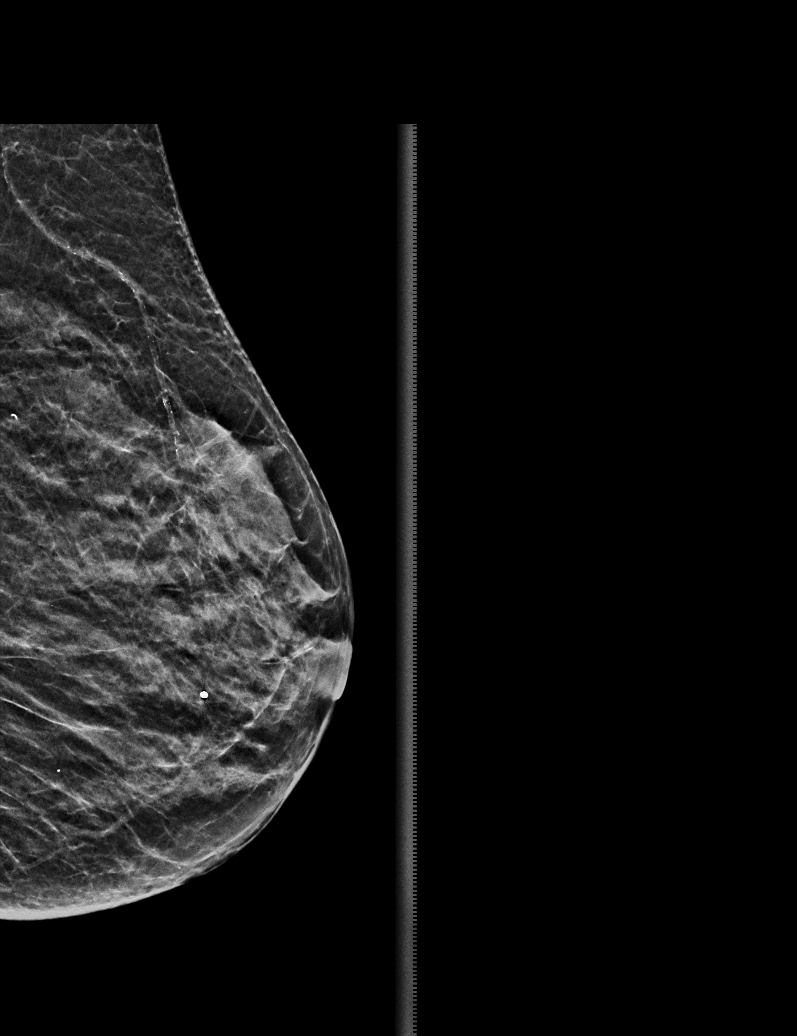

[R MLO synth-2D]
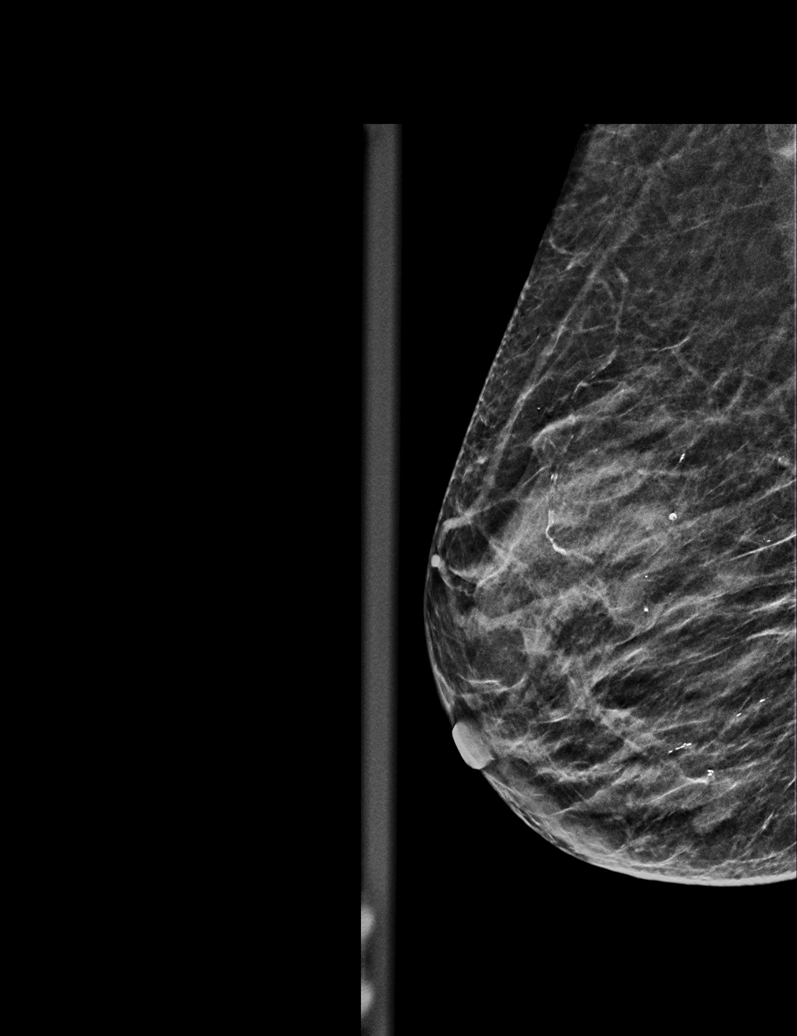

[L MLO tomo · tomo slice 18/35.0]
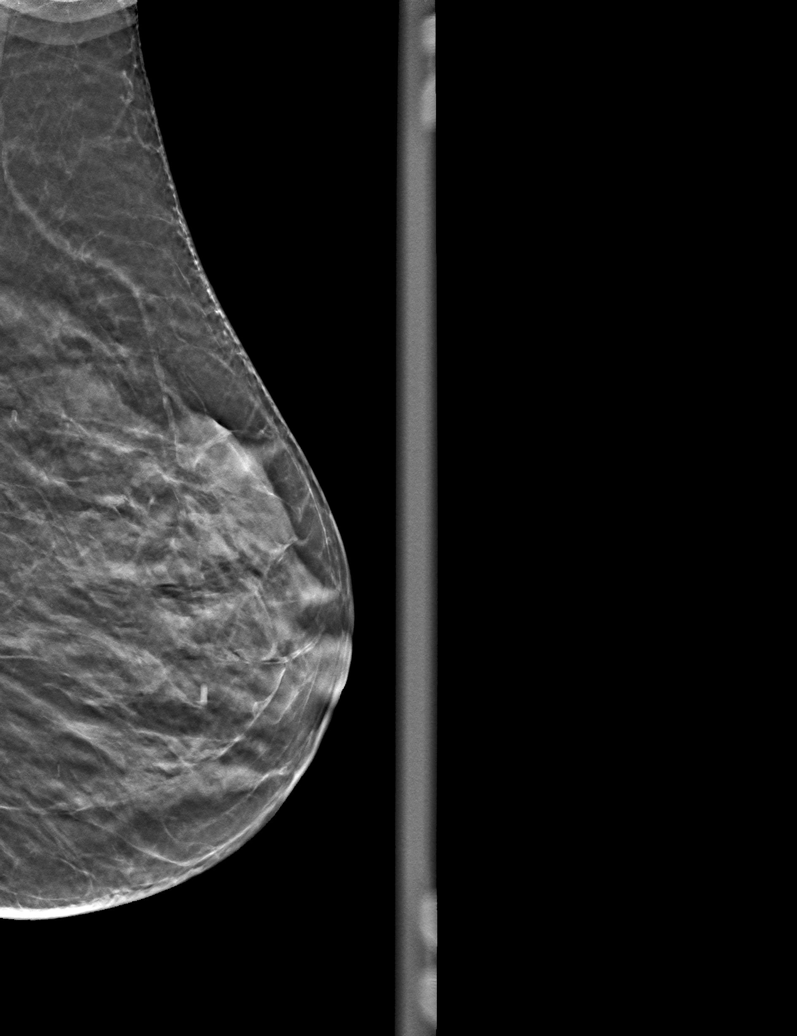

[6 of 30 positions shown; findings below may reference images not displayed]

ACR Breast Density Category c: The breast tissue is heterogeneously
dense, which may obscure small masses.
FINDINGS: In the left breast, a possible mass warrants further evaluation.
This possible mass is seen within the slightly outer LEFT breast, at
posterior depth, cc slice 19, possible correlate on the MLO view
within the upper LEFT breast.

In the right breast, no findings suspicious for malignancy.
IMPRESSION: Further evaluation is suggested for a possible mass in the left
breast.

RECOMMENDATION:
Diagnostic mammogram and possibly ultrasound of the left breast.
(Code:FT-E-IIT)

The patient will be contacted regarding the findings, and additional
imaging will be scheduled.

BI-RADS CATEGORY  0: Incomplete. Need additional imaging evaluation
and/or prior mammograms for comparison.

## 2022-11-02 ENCOUNTER — Ambulatory Visit: Payer: Medicare Other | Admitting: Family Medicine

## 2022-11-02 ENCOUNTER — Encounter: Payer: Self-pay | Admitting: Family Medicine

## 2022-11-03 ENCOUNTER — Ambulatory Visit: Payer: Medicare Other | Admitting: Family Medicine

## 2022-11-03 ENCOUNTER — Encounter: Payer: Self-pay | Admitting: Family Medicine

## 2022-11-03 ENCOUNTER — Ambulatory Visit (INDEPENDENT_AMBULATORY_CARE_PROVIDER_SITE_OTHER): Payer: Medicare Other | Admitting: Family Medicine

## 2022-11-03 VITALS — BP 125/69 | HR 69 | Wt 113.0 lb

## 2022-11-03 DIAGNOSIS — B029 Zoster without complications: Secondary | ICD-10-CM

## 2022-11-03 DIAGNOSIS — K5909 Other constipation: Secondary | ICD-10-CM

## 2022-11-03 NOTE — Progress Notes (Signed)
OFFICE VISIT  11/03/2022  CC:  Chief Complaint  Patient presents with   Acute Visit    Constipation and gas. She also has a sore on her lower back.    Patient is a 77 y.o. female who presents accompanied by family member for evaluation of a bedsore and constipation.  HPI: 1 week ago she developed a painful splotchy bumpy rash about the size of a $0.50 piece on the right posterior superior iliac spine area.  Caregiver says the spots initially looked like little tiny blisters and then they crusted over.  Since then she has had pain radiating around the right flank to the right lower quadrant of her abdomen.  The rash has not spread.  She has been applying Neosporin ointment.  She has not developed any further skin lesions since 1 week ago. She has noted aching more all over lately than usual. She has had more constipation lately.  Taking prune juice daily.  Taking MiraLAX 1 capful sometimes, most recent dose 2 days ago. No nausea or vomiting.  She is hydrating well.  She does have Vicodin at home but has not taken a long time.  Review of systems: No fever, no joint swelling, no chest pain, no shortness of breath, no lower extremity swelling, no nasal congestion or runny nose, no sore throat, no cough.  PMP AWARE reviewed today: most recent rx for alprazolam was filled 08/31/2022, # 90, rx by me. Most recent Vicodin prescription was filled 01/19/2022, #60, prescription by me. No red flags.   Past Medical History:  Diagnosis Date   Allergic rhinitis    maple pollen   Anxiety    Arthritis    Bowel obstruction (HCC)    Chronic low back pain    Dr. Dutch Quint.  Has had back injection--bp elevated after.   Chronic pain syndrome    Chronic renal insufficiency, stage 2 (mild)    GFR 60-70   DDD (degenerative disc disease), cervical    Hx of ACDF (Dr. Jordan Likes).  Followed by Dr. Charlett Blake.  Also, Dr. Vickki Hearing do left C7-T1 intralaminal epidural injection.   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  Resid R arm and leg weakness + flexion contractures.   Hypercholesteremia    mild; pt declined statin trial 05/2014--needs recheck lipid panel at first f/u visit in 2017   Hypertension    LBBB (left bundle branch block)    Osteopenia 06/2017   T score -1.4 FRAX 15% / 2%   Ovarian cyst 2017   82019-robotic assisted bilatel SPO: all path benign.   Perianal dermatitis    prn cutivate   Phlebitis    Right hemiplegia (HCC) 01/2020   R arm and leg (hemorrhagic L cerebral hemisphere CVA)   Right knee injury 2018   Patellofemoral crush injury--Dr. Charlett Blake.   Solitary thyroid nodule 05/2021   needs bx->plan endo ref   Stroke [facility])    Syncope 10/2021   cardiac w/u as per Dr. Mayford Knife   Syncope    2023 (vasovagal suspected)   Trochanteric bursitis of right hip    Recurrent (injection by Dr. Charlett Blake 05/26/15)   Tympanic membrane rupture, right 12/01/2016   As of 08/2018 pt set for tympanomastoidectomy and STSG (Dr. Ignacia Felling). Recurrent fungal/bact OE.  Varicose vein    left leg     Past Surgical History:  Procedure Laterality Date   ABDOMINAL HYSTERECTOMY  03/2015   TAH.  Last pap 2016.  No hx of abnl paps.  Per GYN, no further pap smears indicated.   ANTERIOR AND POSTERIOR REPAIR N/A 03/18/2014   Procedure: Cystocele repair with graft, Vault suspension, Rectocele repair;  Surgeon: Martina Sinner, MD;  Location: [facility];  Service: Urology;  Laterality: N/A;   BIOPSY THYROID  06/2021   benign   CHOLECYSTECTOMY     CHOLECYSTECTOMY, LAPAROSCOPIC     COLON SURGERY     COLONOSCOPY  04/2006; 05/26/16   2007 (Dr. Jarold Motto): Normal.  04/2016 (Dr. Leone Payor) normal except diverticulosis and decreased anal sphincter tone.  No repeat colonoscopy is  recommended due to age.   cspine surgery     Dr. Estelle Grumbles level ant cerv discectomy Lavenia Atlas w/plating   CYSTO N/A 03/18/2014   Procedure: CYSTO;  Surgeon: Martina Sinner, MD;  Location: [facility];  Service: Urology;  Laterality: N/A;   DEXA  07/30/2015; 06/2018   2017 and 2020 -->Osteopenia--repeat 2 yrs. T score -2.29 Mar 2021. Rpt 2 yrs.   LYSIS OF ADHESION N/A 01/31/2018   Procedure: POSSIBLE LYSIS OF ADHESION;  Surgeon: Adolphus Birchwood, MD;  Location: [facility];  Service: Gynecology;  Laterality: N/A;   RECTOCELE REPAIR     with prolasped bledder repair   RESECTION OF COLON     BENIGN TUMOR   RIGHT EAR SURGERY  08/23/2017   Dr. 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  Procedure: XI ROBOTIC ASSISTED BILATERAL SALPINGO OOPHORECTOMY;  Surgeon: Adolphus Birchwood, MD;  Location: [facility];  Service: Gynecology;  Laterality: Bilateral;   TRANSTHORACIC ECHOCARDIOGRAM  01/27/2020   2021 EF 65-70%, NORMAL. 12/01/21 grd I DD, o/w normal.   TUBAL LIGATION      Outpatient Medications Prior to Visit  Medication Sig Dispense Refill   acetaminophen (TYLENOL) 500 MG tablet Take 1 tablet (500 mg total) by mouth 3 (three) times daily. 30 tablet 0   ALPRAZolam (XANAX) 0.5 MG tablet TAKE 1 TABLET BY MOUTH THREE TIMES A DAY AS NEEDED FOR ANXIETY 90 tablet 1   aspirin EC 81 MG tablet Take 1 tablet (81 mg total) by mouth daily. Swallow whole. 30 tablet 11   Biotin 5 MG TABS Take 5 mg by mouth daily.     clobetasol (TEMOVATE) 0.05 %  external solution Apply topically.     fluticasone (FLONASE) 50 MCG/ACT nasal spray Place 2 sprays into both nostrils at bedtime. 48 g 3   HYDROcodone-acetaminophen (NORCO/VICODIN) 5-325 MG tablet Take 1 tablet by mouth every 6 (six) hours as needed for moderate pain. 60 tablet 0   ibuprofen (ADVIL) 200 MG tablet Take 200 mg by mouth every 6 (six) hours as needed.     KLOR-CON M20 20 MEQ tablet TAKE 1 TABLET BY MOUTH EVERY DAY 90 tablet 0   lisinopril (ZESTRIL) 10 MG tablet Take 1 tablet (10 mg total) by mouth daily. MUST KEEP APPT FOR  FURTHER REFILLS 90 tablet 0   loratadine (CLARITIN) 10 MG tablet Take 10 mg by mouth every other day.      magic mouthwash (nystatin, lidocaine, diphenhydrAMINE, alum & mag hydroxide) suspension Swish and spit 10 mLs 4 (four) times daily. 180 mL 1   meclizine (ANTIVERT) 12.5 MG tablet Take 1 tablet (12.5 mg total) by mouth 3 (three) times daily. 1 tab po bid prn dizziness 30 tablet 0   Multiple Minerals-Vitamins (CALCIUM-MAGNESIUM-ZINC-D3 PO) Take 1 tablet by mouth daily.      nystatin ointment (MYCOSTATIN) Apply 1 application topically 2 (two) times daily. Apply to affected area for up to 7 days. 30 g 0   pantoprazole (PROTONIX) 40 MG tablet TAKE 1 TABLET BY MOUTH EVERYDAY AT BEDTIME 90 tablet 1   polyethylene glycol (MIRALAX / GLYCOLAX) 17 g packet Take 17 g by mouth daily as needed.     Sodium Fluoride (CLINPRO 5000) 1.1 % PSTE Clinpro 5000 1.1 % dental paste     tiZANidine (ZANAFLEX) 2 MG tablet Take 1 tablet (2 mg total) by mouth 3 (three) times daily. 90 tablet 5   triamcinolone ointment (KENALOG) 0.1 %      verapamil (CALAN-SR) 180 MG CR tablet Take 1 tablet (180 mg total) by mouth daily. 90 tablet 3   No facility-administered medications prior to visit.    Allergies  Allergen Reactions   Gabapentin Anxiety    Elevated heart rate and crazy dreams   Prednisone Anxiety and Other (See Comments)    REACTION: funny feeling    Review of Systems  As per  HPI  PE:    11/03/2022    2:21 PM 10/26/2022   11:18 AM 09/16/2022   10:52 AM  Vitals with BMI  Height   5\' 0"   Weight 113 lbs  117 lbs  BMI   22.85  Systolic 125 148 161  Diastolic 69 76 75  Pulse 69 74 90     Physical Exam  Gen: Alert, well appearing, sitting in wheelchair.  Patient is oriented to person, place, time, and situation. CV: RRR, no m/r/g.   LUNGS: CTA bilat, nonlabored resps, good aeration in all lung fields. Abdomen is soft, mild right lower quadrant, right flank, and right lumbar region with sensitivity to palpation.  No distention.  Bowel sounds are a bit hyperactive.  No guarding or rebound. Extremities: No edema. Skin: Right posterior superior iliac spine area with circular maculopapular splotch, mildly eryth background. No vesicles.  No skin breakdown.  LABS:  Last metabolic panel Lab Results  Component Value Date   GLUCOSE 104 (H) 09/07/2022   NA 132 (L) 09/07/2022   K 4.6 09/07/2022   CL 96 09/07/2022   CO2 26 09/07/2022   BUN 9 09/07/2022   CREATININE 0.67 09/07/2022   GFRNONAA >60 11/02/2021   CALCIUM 10.1 09/07/2022   PROT 6.4 (L) 11/02/2021   ALBUMIN 3.7 11/02/2021   BILITOT 0.5 11/02/2021   ALKPHOS 66 11/02/2021   AST 25 11/02/2021   ALT 19 11/02/2021   ANIONGAP 7 11/02/2021   IMPRESSION AND PLAN:  #1 herpes zoster right flank. She is out of the treatment window for antiviral. She has a history of intolerance to gabapentin. She is apprehensive about taking opioids but she does have some Vicodin and will take this in small amounts for this pain. If pain not resolving well over the next week then will consider trial of either low-dose Lyrica or low-dose amitriptyline. She will need to get Shingrix  in about 3 months.  2.  Constipation.  Increase MiraLAX to 1 capful 3 times a day until she has 1 good evacuation. Then decrease to 1 capful once a day.  Continue prune juice. She is aware that opioids can make constipation worse but we  certainly do not think she is going to take them in any amount to cause problems.  An After Visit Summary was printed and given to the patient.  FOLLOW UP: Return if symptoms worsen or fail to improve.  Signed:  Santiago Bumpers, MD           11/03/2022

## 2022-11-03 NOTE — Patient Instructions (Signed)
Take 1/2 to 1 hydrocodone tab two times a day as needed for pain.  Take 1 capful of miralax three times a day until you have a good bowel movement. Then decrease to 1 capful once a day. Continue prune juice daily.

## 2022-11-08 ENCOUNTER — Telehealth: Payer: Self-pay

## 2022-11-08 DIAGNOSIS — R110 Nausea: Secondary | ICD-10-CM | POA: Diagnosis not present

## 2022-11-08 NOTE — Telephone Encounter (Signed)
Received forms regarding power wheelchair and requirements. Placed on PCP desk to review and sign, if appropriate.

## 2022-11-09 NOTE — Telephone Encounter (Signed)
Signed and put in box to go up front. Signed:  Santiago Bumpers, MD           11/09/2022

## 2022-11-15 ENCOUNTER — Telehealth: Payer: Self-pay | Admitting: Internal Medicine

## 2022-11-15 ENCOUNTER — Telehealth: Payer: Self-pay

## 2022-11-15 NOTE — Telephone Encounter (Signed)
Pt c/o lips peeling and swollen. Denies rash, sore throat and itchy throat. She has been using burts bees and chapstick. Pt is unsure if she is having allergic reaction to something. Pt transferred to triage nurse, Shanda Bumps nurse coordinator.

## 2022-11-15 NOTE — Telephone Encounter (Signed)
Routed to PCP in error

## 2022-11-15 NOTE — Telephone Encounter (Signed)
Patient called states she is not feeling well has a lot of diarrhea and said she had a stroke 3 years ago.

## 2022-11-15 NOTE — Telephone Encounter (Signed)
Pt stated that she has been having ongoing problems with constipation after her stroke several years ago. Last BM this AM. Pt stated that she has a GI history where part of her Colon was removed. Pt currently taking Miralax   Pt was scheduled to see Alcide Evener NP on 11/22/2022 at 2:00 PM. Pt made aware. Pt verbalized understanding with all questions answered.

## 2022-11-16 ENCOUNTER — Ambulatory Visit: Payer: Medicare Other | Admitting: Internal Medicine

## 2022-11-16 NOTE — Telephone Encounter (Signed)
[facility] Day - Client TELEPHONE ADVICE RECORD [facility] Patient Name First: Alison Last: Cuevas Gender: Female DOB: 05-24-1946 Age: 77 Y 1 M 16 D Return Phone Number: [phone] (Primary), [phone] (Secondary) Address: [address] City/ State/ Zip: [place] [address] Client [facility] Day - Client Client Site [facility] - Day Provider Santiago Bumpers - MD Contact Type Call Who Is Calling Patient / Member / Family / Caregiver Call Type Triage / Clinical Relationship To Patient Self Return Phone Number [phone] (Secondary) Chief Complaint Facial Swelling Reason for Call Symptomatic / Request for Health Information Initial Comment Caller states she has lip swelling and peeling. GOTO Facility Not Listed [facility] Translation No Nurse Assessment Nurse: Violeta Gelinas, RN, Regulatory affairs officer (Eastern Time): 11/15/2022 2:53:16 PM Confirm and document reason for call. If symptomatic, describe symptoms. ---Pt having some lip swelling and peeling, burning when put something on lips. onset of swelling yesterday. peeling for a while. lips seem dry. Does the patient have any new or worsening symptoms? ---Yes Will a triage be completed? ---Yes Related visit to physician within the last 2 weeks? ---No Does the PT have any chronic conditions? (i.e. diabetes, asthma, this includes High risk factors for pregnancy, etc.) ---Yes List chronic conditions. ---HTN Is this a behavioral health or substance abuse call? ---No Guidelines Guideline Title Affirmed Question Affirmed Notes Nurse Date/Time Lamount Cohen Time) Lip Swelling Taking an ACE Inhibitor medicine (e.g., benazepril / LOTENSIN, captopril / CAPOTEN, enalapril / Violeta Gelinas, RN, [facility] 11/15/2022 2:53:19 PM PLEASE NOTE: All timestamps contained within this report are represented as [place] Standard Time. CONFIDENTIALTY NOTICE: This fax  transmission is intended only for the addressee. It contains information that is legally privileged, confidential or otherwise protected from use or disclosure. If you are not the intended recipient, you are strictly prohibited from reviewing, disclosing, copying using or disseminating any of this information or taking any action in reliance on or regarding this information. If you have received this fax in error, please notify us immediately by telephone so that we can arrange for its return to [place]. Phone: [phone], Toll-Free: [phone], Fax: [phone] Page: 2 of 2 Call Id: [id number] Guidelines Guideline Title Affirmed Question Affirmed Notes Nurse Date/Time Lamount Cohen Time) VASOTEC, lisinopril / ZESTRIL) Disp. Time Lamount Cohen Time) Disposition Final User 11/15/2022 2:59:41 PM Go to ED Now Yes Violeta Gelinas, RN, Amber Final Disposition 11/15/2022 2:59:41 PM Go to ED Now Yes Violeta Gelinas, RN, Control and instrumentation engineer Understands Yes PreDisposition Did not know what to do Care Advice Given Per Guideline GO TO ED NOW: CALL EMS 911 IF: * Any difficulty breathing or swallowing * Any tongue swelling or swelling inside your mouth CARE ADVICE given per Lip Swelling (Adult) guideline. Comments User: Adriana Simas, RN Date/Time Lamount Cohen Time): 11/15/2022 3:03:14 PM pt will try to get a ride from either daughter or someone else Referrals GO TO FACILITY REFUSED GO TO FACILITY UNDECIDED GO TO FACILITY OTHER - SPECIFY

## 2022-11-17 ENCOUNTER — Encounter (HOSPITAL_COMMUNITY): Payer: Self-pay

## 2022-11-17 ENCOUNTER — Emergency Department (HOSPITAL_COMMUNITY): Payer: Medicare Other

## 2022-11-17 ENCOUNTER — Other Ambulatory Visit: Payer: Self-pay

## 2022-11-17 ENCOUNTER — Emergency Department (HOSPITAL_COMMUNITY)
Admission: EM | Admit: 2022-11-17 | Discharge: 2022-11-17 | Disposition: A | Discharge status: Home / Self Care | Payer: Medicare Other | Attending: Emergency Medicine | Admitting: Emergency Medicine

## 2022-11-17 DIAGNOSIS — W19XXXA Unspecified fall, initial encounter: Secondary | ICD-10-CM | POA: Diagnosis not present

## 2022-11-17 DIAGNOSIS — Z7982 Long term (current) use of aspirin: Secondary | ICD-10-CM | POA: Insufficient documentation

## 2022-11-17 DIAGNOSIS — I10 Essential (primary) hypertension: Secondary | ICD-10-CM | POA: Diagnosis not present

## 2022-11-17 DIAGNOSIS — I6782 Cerebral ischemia: Secondary | ICD-10-CM | POA: Diagnosis not present

## 2022-11-17 DIAGNOSIS — M545 Low back pain, unspecified: Secondary | ICD-10-CM | POA: Diagnosis not present

## 2022-11-17 DIAGNOSIS — M549 Dorsalgia, unspecified: Secondary | ICD-10-CM | POA: Diagnosis not present

## 2022-11-17 DIAGNOSIS — W010XXA Fall on same level from slipping, tripping and stumbling without subsequent striking against object, initial encounter: Secondary | ICD-10-CM | POA: Insufficient documentation

## 2022-11-17 DIAGNOSIS — M419 Scoliosis, unspecified: Secondary | ICD-10-CM | POA: Diagnosis not present

## 2022-11-17 DIAGNOSIS — M5459 Other low back pain: Secondary | ICD-10-CM | POA: Diagnosis not present

## 2022-11-17 DIAGNOSIS — M542 Cervicalgia: Secondary | ICD-10-CM | POA: Diagnosis not present

## 2022-11-17 DIAGNOSIS — M47816 Spondylosis without myelopathy or radiculopathy, lumbar region: Secondary | ICD-10-CM | POA: Diagnosis not present

## 2022-11-17 DIAGNOSIS — S0990XA Unspecified injury of head, initial encounter: Secondary | ICD-10-CM | POA: Diagnosis not present

## 2022-11-17 DIAGNOSIS — R519 Headache, unspecified: Secondary | ICD-10-CM | POA: Diagnosis not present

## 2022-11-17 MED ORDER — HYDROCODONE-ACETAMINOPHEN 5-325 MG PO TABS
1.0000 | ORAL_TABLET | Freq: Once | ORAL | Status: AC
  Administered 2022-11-17: 1 via ORAL
  Filled 2022-11-17: qty 1

## 2022-11-17 NOTE — ED Triage Notes (Signed)
[facility] reports pt coming from home for a fall. Pt got tripped up in her walker and fell, c/o lower back pain. Denies any LOC.

## 2022-11-17 NOTE — ED Provider Notes (Signed)
[facility] AT [facility] Provider Note   CSN: [id number] Arrival date & time: 11/17/22  1426     History  Chief Complaint  Patient presents with   Back Pain    Alison Cuevas is a 77 y.o. female.  The ER for low back pain after fall.  She was trying to walk over a threshold and lost her balance, fell backward onto her back, she believes she hit her head as well.  She is on a blood thinners.  Denies any numbness or tingling or weakness in her lower extremities but is having increase in her chronic back pain.  She is history of scoliosis in her back so does have chronic pain.  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  She denies any hip knee or lower extremity pain, no chest abdominal pain or upper extremity pain.  No LOC.   Back Pain      Home Medications Prior to Admission medications   Medication Sig Start Date End Date Taking? Authorizing Provider  acetaminophen (TYLENOL) 500 MG tablet Take 1 tablet (500 mg total) by mouth 3 (three) times daily. 02/19/20  Yes Love, Evlyn Kanner, PA-C  ALPRAZolam (XANAX) 0.5 MG tablet TAKE 1 TABLET BY MOUTH THREE TIMES A DAY AS NEEDED FOR ANXIETY Patient taking differently: Take 0.5 mg by mouth at bedtime. 08/31/22  Yes McGowen, Maryjean Morn, MD  aspirin EC 81 MG tablet Take 1 tablet (81 mg total) by mouth daily. Swallow whole. 04/20/20  Yes McCue, Shanda Bumps, NP  Biotin 5 MG TABS Take 5 mg by mouth daily.   Yes [provider]  clobetasol (TEMOVATE) 0.05 % external solution Apply topically. 01/13/22  Yes [provider]  fluticasone (FLONASE) 50 MCG/ACT nasal spray Place 2 sprays into both nostrils at bedtime. Patient taking differently: Place 1 spray into both nostrils at bedtime. 05/17/16  Yes McGowen, Maryjean Morn, MD  KLOR-CON M20 20 MEQ tablet TAKE 1 TABLET BY MOUTH EVERY DAY 08/31/22  Yes McGowen, Maryjean Morn, MD  lisinopril  (ZESTRIL) 10 MG tablet Take 1 tablet (10 mg total) by mouth daily. MUST KEEP APPT FOR FURTHER REFILLS 10/27/22  Yes McGowen, Maryjean Morn, MD  loratadine (CLARITIN) 10 MG tablet Take 10 mg by mouth every other day.    Yes [provider]  magic mouthwash (nystatin, lidocaine, diphenhydrAMINE, alum & mag hydroxide) suspension Swish and spit 10 mLs 4 (four) times daily. 09/16/22  Yes McGowen, Maryjean Morn, MD  meclizine (ANTIVERT) 12.5 MG tablet Take 1 tablet (12.5 mg total) by mouth 3 (three) times daily. 1 tab po bid prn dizziness 10/24/22  Yes McGowen, Maryjean Morn, MD  Multiple Minerals-Vitamins (CALCIUM-MAGNESIUM-ZINC-D3 PO) Take 1 tablet by mouth daily.    Yes [provider]  nystatin ointment (MYCOSTATIN) Apply 1 application topically 2 (two) times daily. Apply to affected area for up to 7 days. 10/20/20  Yes Clarita Crane, NP  pantoprazole (PROTONIX) 40 MG tablet TAKE 1 TABLET BY MOUTH EVERYDAY AT BEDTIME Patient taking differently: Take 40 mg by mouth at bedtime. 09/07/22  Yes McGowen, Maryjean Morn, MD  polyethylene glycol (MIRALAX / GLYCOLAX) 17 g packet Take 17 g by mouth daily as needed.   Yes [provider]  Sodium Fluoride (CLINPRO 5000) 1.1 % PSTE Take 1 Application by mouth at bedtime.   Yes [provider]  tiZANidine (ZANAFLEX) 2 MG  tablet Take 1 tablet (2 mg total) by mouth 3 (three) times daily. 10/26/22  Yes Ranelle Oyster, MD  verapamil (CALAN-SR) 180 MG CR tablet Take 1 tablet (180 mg total) by mouth daily. 02/17/22  Yes McGowen, Maryjean Morn, MD  HYDROcodone-acetaminophen (NORCO/VICODIN) 5-325 MG tablet Take 1 tablet by mouth every 6 (six) hours as needed for moderate pain. Patient not taking: Reported on 11/17/2022 01/19/22   Jeoffrey Massed, MD      Allergies    Gabapentin and Prednisone    Review of Systems   Review of Systems  Musculoskeletal:  Positive for back pain.    Physical Exam Updated Vital Signs BP (!) 168/95 (BP Location: Right Arm)   Pulse 95    Temp 98.5 F (36.9 C) (Oral)   Resp 17   LMP 06/27/1972 Comment: not sexually active  SpO2 96%  Physical Exam Vitals and nursing note reviewed.  Constitutional:      General: She is not in acute distress.    Appearance: She is well-developed.  HENT:     Head: Normocephalic and atraumatic.     Comments: Right TM is abnormal in appearance, appears to be scarred, possible chronic perforation.  Landmarks are distorted from prior injury there is no erythema or drainage Eyes:     Conjunctiva/sclera: Conjunctivae normal.  Cardiovascular:     Rate and Rhythm: Normal rate and regular rhythm.     Heart sounds: No murmur heard. Pulmonary:     Effort: Pulmonary effort is normal. No respiratory distress.     Breath sounds: Normal breath sounds.  Abdominal:     Palpations: Abdomen is soft.     Tenderness: There is no abdominal tenderness.  Musculoskeletal:        General: No swelling.     Cervical back: Normal range of motion and neck supple. No tenderness.     Thoracic back: Normal.     Lumbar back: Bony tenderness present. No deformity or lacerations. Scoliosis present.     Right lower leg: No edema.     Left lower leg: No edema.  Skin:    General: Skin is warm and dry.     Capillary Refill: Capillary refill takes less than 2 seconds.  Neurological:     Mental Status: She is alert.  Psychiatric:        Mood and Affect: Mood normal.     ED Results / Procedures / Treatments   Labs (all labs ordered are listed, but only abnormal results are displayed) Labs Reviewed - No data to display  EKG None  Radiology DG Lumbar Spine Complete  Result Date: 11/17/2022 CLINICAL DATA:  Fall. EXAM: LUMBAR SPINE - COMPLETE 4+ VIEW COMPARISON:  CT abdomen pelvis dated June 26, 2020. FINDINGS: Five lumbar type vertebral bodies. No acute fracture or subluxation. Vertebral body heights are preserved. Unchanged prominent levoscoliosis. Unchanged mild retrolisthesis at L1-L2 and L2-L3 and mild  anterolisthesis at L3-L4. Unchanged severe disc height loss and facet arthropathy throughout the lumbar spine. IMPRESSION: 1. No acute osseous abnormality. 2. Unchanged severe multilevel lumbar spondylosis. Electronically Signed   By: Obie Dredge M.D.   On: 11/17/2022 17:31   CT Cervical Spine Wo Contrast  Result Date: 11/17/2022 CLINICAL DATA:  Tripped and fell, back pain EXAM: CT CERVICAL SPINE WITHOUT CONTRAST TECHNIQUE: Multidetector CT imaging of the cervical spine was performed without intravenous contrast. Multiplanar CT image reconstructions were also generated. RADIATION DOSE REDUCTION: This exam was performed according to the departmental dose-optimization program  which includes automated exposure control, adjustment of the mA and/or kV according to patient size and/or use of iterative reconstruction technique. COMPARISON:  05/16/2021 FINDINGS: Alignment: Alignment is grossly anatomic. Skull base and vertebrae: No acute fracture. No primary bone lesion or focal pathologic process. Soft tissues and spinal canal: No prevertebral fluid or swelling. No visible canal hematoma. Disc levels: Prior C3-C6 ACDF, with no evidence of orthopedic hardware failure or loosening. Slight progression of the spondylosis seen previously at C2-3 and C6-7. Upper chest: Airway is patent.  Lung apices are clear. Other: Reconstructed images demonstrate no additional findings. IMPRESSION: 1. No acute cervical spine fracture. 2. Stable C3-C6 ACDF. 3. Slight progression of spondylosis at C2-3 and C6-7. Electronically Signed   By: Sharlet Salina M.D.   On: 11/17/2022 16:48   CT Head Wo Contrast  Result Date: 11/17/2022 CLINICAL DATA:  Tripped and fell, lower back pain EXAM: CT HEAD WITHOUT CONTRAST TECHNIQUE: Contiguous axial images were obtained from the base of the skull through the vertex without intravenous contrast. RADIATION DOSE REDUCTION: This exam was performed according to the departmental dose-optimization  program which includes automated exposure control, adjustment of the mA and/or kV according to patient size and/or use of iterative reconstruction technique. COMPARISON:  09/04/2021 FINDINGS: Brain: Stable encephalomalacia at the left frontal parietal vertex consistent with prior cortical infarct. Chronic small vessel ischemic changes are seen throughout the periventricular white matter. No evidence of acute infarct or hemorrhage. Lateral ventricles and midline structures are stable. No acute extra-axial fluid collections. No mass effect. Vascular: No hyperdense vessel or unexpected calcification. Skull: Normal. Negative for fracture or focal lesion. Sinuses/Orbits: Postsurgical changes from prior right mastoidectomy. Small right mastoid effusion is noted. 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 Other: None. IMPRESSION: 1. No acute intracranial process. 2. Stable chronic ischemic changes as above. Electronically Signed   By: Sharlet Salina M.D.   On: 11/17/2022 16:46    Procedures Procedures    Medications Ordered in ED Medications  HYDROcodone-acetaminophen (NORCO/VICODIN) 5-325 MG per tablet 1 tablet (1 tablet Oral Given 11/17/22 1621)    ED Course/ Medical Decision Making/ A&P                             Medical Decision Making DDx: Fracture, sprain, strain, contusion, other  ED course.  Patient had a mechanical fall today complaining of low back pain after this.  She is also elderly and did hit her head.  Exam was reassuring.  She was given pain medication with some relief.  She reported some soreness around her mouth and thought she had thrush.  She states she has a prescription at the pharmacy for nystatin that she is going to fill tomorrow.  She does have some small amount of white patches on her tongue and states this feels similar so I encouraged her to pick up her medication at the pharmacy.  No fevers or chills.  No numbness tingling or weakness.  She is advised on  follow-up and return precautions.  Also asked me to look in her right ear because she had some tinnitus since the fall.  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  There is no drainage or erythema and no pain.  Encouraged to follow-up with her ENT doctor Imaging: I ordered and independently interpreted x-ray lumbar spine and CT  head and C-spine.  No acute fractures, lumbar spine, no traumatic malalignment I agree with radiology interpretation  CT head no acute intracranial abnormality; CT cervical spine no acute fracture or malalignment agree with radiology interpretation  Amount and/or Complexity of Data Reviewed Radiology: ordered.  Risk Prescription drug management.           Final Clinical Impression(s) / ED Diagnoses Final diagnoses:  Acute low back pain without sciatica, unspecified back pain laterality    Rx / DC Orders ED Discharge Orders     None         Josem Kaufmann 11/17/22 Rowland Lathe, MD 11/17/22 2014

## 2022-11-17 NOTE — Discharge Instructions (Addendum)
You are seen today for evaluation after a fall.  We did x-rays of your back and there is no signs of any acute fractures.  You take medication at home as needed for pain over-the-counter.  Follow-up with your primary care doctor.  Go back to the ER if you have any new or worsening symptoms.  We did a CT scan of your head and your neck due to your fall as well.  There is no sign of fractures or bleeding but you do have changes.  Regarding your ringing in your ear you should follow-up with your ENT doctor who follows your chronic ear issues

## 2022-11-18 NOTE — Telephone Encounter (Signed)
Alison Cuevas, pls have patient follow up with her PCP regarding her ED evaluation. Pls reschedule her GI office appointment in a few weeks. THX

## 2022-11-18 NOTE — Telephone Encounter (Signed)
Pt has been advised of recommendation for PCP follow up. Viviann Spare please contact patient on 11/22/22 to schedule her an appt on 11/28/22 with APP. Thanks

## 2022-11-18 NOTE — Telephone Encounter (Signed)
PT is scheduled to come in on 5/28 but fell after having stroke symptoms on yesterday and had to go to ED and have X-Rays. She wants to know if she should come to the appointment. Please advise

## 2022-11-20 ENCOUNTER — Emergency Department (HOSPITAL_COMMUNITY): Payer: Medicare Other

## 2022-11-20 ENCOUNTER — Encounter: Payer: Self-pay | Admitting: Physical Medicine & Rehabilitation

## 2022-11-20 ENCOUNTER — Encounter (HOSPITAL_COMMUNITY): Payer: Self-pay | Admitting: *Deleted

## 2022-11-20 ENCOUNTER — Emergency Department (HOSPITAL_COMMUNITY)
Admission: EM | Admit: 2022-11-20 | Discharge: 2022-11-20 | Disposition: A | Discharge status: Home / Self Care | Payer: Medicare Other | Source: Home / Self Care | Attending: Emergency Medicine | Admitting: Emergency Medicine

## 2022-11-20 ENCOUNTER — Other Ambulatory Visit: Payer: Self-pay

## 2022-11-20 DIAGNOSIS — W19XXXA Unspecified fall, initial encounter: Secondary | ICD-10-CM | POA: Diagnosis not present

## 2022-11-20 DIAGNOSIS — Z7982 Long term (current) use of aspirin: Secondary | ICD-10-CM | POA: Insufficient documentation

## 2022-11-20 DIAGNOSIS — Z9181 History of falling: Secondary | ICD-10-CM | POA: Diagnosis not present

## 2022-11-20 DIAGNOSIS — I4510 Unspecified right bundle-branch block: Secondary | ICD-10-CM | POA: Diagnosis not present

## 2022-11-20 DIAGNOSIS — R498 Other voice and resonance disorders: Secondary | ICD-10-CM | POA: Diagnosis not present

## 2022-11-20 DIAGNOSIS — E785 Hyperlipidemia, unspecified: Secondary | ICD-10-CM | POA: Diagnosis not present

## 2022-11-20 DIAGNOSIS — I700 Atherosclerosis of aorta: Secondary | ICD-10-CM | POA: Diagnosis not present

## 2022-11-20 DIAGNOSIS — I447 Left bundle-branch block, unspecified: Secondary | ICD-10-CM | POA: Diagnosis present

## 2022-11-20 DIAGNOSIS — E860 Dehydration: Secondary | ICD-10-CM | POA: Diagnosis present

## 2022-11-20 DIAGNOSIS — R488 Other symbolic dysfunctions: Secondary | ICD-10-CM | POA: Diagnosis not present

## 2022-11-20 DIAGNOSIS — I129 Hypertensive chronic kidney disease with stage 1 through stage 4 chronic kidney disease, or unspecified chronic kidney disease: Secondary | ICD-10-CM | POA: Diagnosis present

## 2022-11-20 DIAGNOSIS — M5030 Other cervical disc degeneration, unspecified cervical region: Secondary | ICD-10-CM | POA: Diagnosis present

## 2022-11-20 DIAGNOSIS — K3189 Other diseases of stomach and duodenum: Secondary | ICD-10-CM | POA: Diagnosis not present

## 2022-11-20 DIAGNOSIS — N182 Chronic kidney disease, stage 2 (mild): Secondary | ICD-10-CM | POA: Diagnosis present

## 2022-11-20 DIAGNOSIS — M25552 Pain in left hip: Secondary | ICD-10-CM | POA: Diagnosis not present

## 2022-11-20 DIAGNOSIS — R42 Dizziness and giddiness: Secondary | ICD-10-CM | POA: Diagnosis not present

## 2022-11-20 DIAGNOSIS — M1389 Other specified arthritis, multiple sites: Secondary | ICD-10-CM | POA: Diagnosis not present

## 2022-11-20 DIAGNOSIS — K5900 Constipation, unspecified: Secondary | ICD-10-CM | POA: Diagnosis not present

## 2022-11-20 DIAGNOSIS — R1312 Dysphagia, oropharyngeal phase: Secondary | ICD-10-CM | POA: Diagnosis not present

## 2022-11-20 DIAGNOSIS — R2681 Unsteadiness on feet: Secondary | ICD-10-CM | POA: Diagnosis not present

## 2022-11-20 DIAGNOSIS — E43 Unspecified severe protein-calorie malnutrition: Secondary | ICD-10-CM | POA: Diagnosis not present

## 2022-11-20 DIAGNOSIS — N390 Urinary tract infection, site not specified: Secondary | ICD-10-CM | POA: Diagnosis not present

## 2022-11-20 DIAGNOSIS — M8580 Other specified disorders of bone density and structure, unspecified site: Secondary | ICD-10-CM | POA: Diagnosis present

## 2022-11-20 DIAGNOSIS — Z1152 Encounter for screening for COVID-19: Secondary | ICD-10-CM | POA: Diagnosis not present

## 2022-11-20 DIAGNOSIS — I69351 Hemiplegia and hemiparesis following cerebral infarction affecting right dominant side: Secondary | ICD-10-CM | POA: Diagnosis not present

## 2022-11-20 DIAGNOSIS — B9561 Methicillin susceptible Staphylococcus aureus infection as the cause of diseases classified elsewhere: Secondary | ICD-10-CM | POA: Diagnosis present

## 2022-11-20 DIAGNOSIS — R55 Syncope and collapse: Secondary | ICD-10-CM | POA: Insufficient documentation

## 2022-11-20 DIAGNOSIS — R531 Weakness: Secondary | ICD-10-CM | POA: Insufficient documentation

## 2022-11-20 DIAGNOSIS — I639 Cerebral infarction, unspecified: Secondary | ICD-10-CM | POA: Diagnosis not present

## 2022-11-20 DIAGNOSIS — N3000 Acute cystitis without hematuria: Secondary | ICD-10-CM | POA: Diagnosis not present

## 2022-11-20 DIAGNOSIS — M6281 Muscle weakness (generalized): Secondary | ICD-10-CM | POA: Diagnosis not present

## 2022-11-20 DIAGNOSIS — F411 Generalized anxiety disorder: Secondary | ICD-10-CM | POA: Diagnosis not present

## 2022-11-20 DIAGNOSIS — R627 Adult failure to thrive: Secondary | ICD-10-CM | POA: Diagnosis present

## 2022-11-20 DIAGNOSIS — E871 Hypo-osmolality and hyponatremia: Secondary | ICD-10-CM | POA: Diagnosis not present

## 2022-11-20 DIAGNOSIS — K219 Gastro-esophageal reflux disease without esophagitis: Secondary | ICD-10-CM | POA: Diagnosis not present

## 2022-11-20 DIAGNOSIS — R1084 Generalized abdominal pain: Secondary | ICD-10-CM | POA: Diagnosis not present

## 2022-11-20 DIAGNOSIS — Z6821 Body mass index (BMI) 21.0-21.9, adult: Secondary | ICD-10-CM | POA: Diagnosis not present

## 2022-11-20 DIAGNOSIS — M797 Fibromyalgia: Secondary | ICD-10-CM | POA: Diagnosis not present

## 2022-11-20 DIAGNOSIS — I959 Hypotension, unspecified: Secondary | ICD-10-CM | POA: Diagnosis not present

## 2022-11-20 DIAGNOSIS — M545 Low back pain, unspecified: Secondary | ICD-10-CM | POA: Diagnosis not present

## 2022-11-20 DIAGNOSIS — R262 Difficulty in walking, not elsewhere classified: Secondary | ICD-10-CM | POA: Diagnosis not present

## 2022-11-20 DIAGNOSIS — I69951 Hemiplegia and hemiparesis following unspecified cerebrovascular disease affecting right dominant side: Secondary | ICD-10-CM | POA: Diagnosis not present

## 2022-11-20 DIAGNOSIS — R296 Repeated falls: Secondary | ICD-10-CM | POA: Diagnosis present

## 2022-11-20 DIAGNOSIS — E7800 Pure hypercholesterolemia, unspecified: Secondary | ICD-10-CM | POA: Diagnosis present

## 2022-11-20 DIAGNOSIS — F419 Anxiety disorder, unspecified: Secondary | ICD-10-CM | POA: Diagnosis present

## 2022-11-20 DIAGNOSIS — K5730 Diverticulosis of large intestine without perforation or abscess without bleeding: Secondary | ICD-10-CM | POA: Diagnosis not present

## 2022-11-20 DIAGNOSIS — G9341 Metabolic encephalopathy: Secondary | ICD-10-CM | POA: Diagnosis not present

## 2022-11-20 DIAGNOSIS — I10 Essential (primary) hypertension: Secondary | ICD-10-CM | POA: Diagnosis not present

## 2022-11-20 DIAGNOSIS — G894 Chronic pain syndrome: Secondary | ICD-10-CM | POA: Diagnosis present

## 2022-11-20 DIAGNOSIS — R1030 Lower abdominal pain, unspecified: Secondary | ICD-10-CM | POA: Diagnosis not present

## 2022-11-20 DIAGNOSIS — W07XXXA Fall from chair, initial encounter: Secondary | ICD-10-CM | POA: Diagnosis present

## 2022-11-20 DIAGNOSIS — M4854XA Collapsed vertebra, not elsewhere classified, thoracic region, initial encounter for fracture: Secondary | ICD-10-CM | POA: Diagnosis present

## 2022-11-20 DIAGNOSIS — W01198A Fall on same level from slipping, tripping and stumbling with subsequent striking against other object, initial encounter: Secondary | ICD-10-CM | POA: Insufficient documentation

## 2022-11-20 DIAGNOSIS — Z79899 Other long term (current) drug therapy: Secondary | ICD-10-CM | POA: Diagnosis not present

## 2022-11-20 LAB — COMPREHENSIVE METABOLIC PANEL
ALT: 21 U/L (ref 0–44)
AST: 24 U/L (ref 15–41)
Albumin: 3.8 g/dL (ref 3.5–5.0)
Alkaline Phosphatase: 54 U/L (ref 38–126)
Anion gap: 12 (ref 5–15)
BUN: 11 mg/dL (ref 8–23)
CO2: 21 mmol/L — ABNORMAL LOW (ref 22–32)
Calcium: 9.1 mg/dL (ref 8.9–10.3)
Chloride: 94 mmol/L — ABNORMAL LOW (ref 98–111)
Creatinine, Ser: 0.71 mg/dL (ref 0.44–1.00)
GFR, Estimated: >60 mL/min (ref >60)
Glucose, Bld: 125 mg/dL — ABNORMAL HIGH (ref 70–99)
Potassium: 3.9 mmol/L (ref 3.5–5.1)
Sodium: 127 mmol/L — ABNORMAL LOW (ref 135–145)
Total Bilirubin: 0.9 mg/dL (ref 0.3–1.2)
Total Protein: 6.7 g/dL (ref 6.5–8.1)

## 2022-11-20 LAB — CBC WITH DIFFERENTIAL/PLATELET
Abs Immature Granulocytes: 0.04 10*3/uL (ref 0.00–0.07)
Basophils Absolute: 0 10*3/uL (ref 0.0–0.1)
Basophils Relative: 1 %
Eosinophils Absolute: 0 10*3/uL (ref 0.0–0.5)
Eosinophils Relative: 1 %
HCT: 35.8 % — ABNORMAL LOW (ref 36.0–46.0)
Hemoglobin: 12.8 g/dL (ref 12.0–15.0)
Immature Granulocytes: 1 %
Lymphocytes Relative: 13 %
Lymphs Abs: 0.8 10*3/uL (ref 0.7–4.0)
MCH: 31.9 pg (ref 26.0–34.0)
MCHC: 35.8 g/dL (ref 30.0–36.0)
MCV: 89.3 fL (ref 80.0–100.0)
Monocytes Absolute: 0.3 10*3/uL (ref 0.1–1.0)
Monocytes Relative: 5 %
Neutro Abs: 5.2 10*3/uL (ref 1.7–7.7)
Neutrophils Relative %: 79 %
Platelets: 157 10*3/uL (ref 150–400)
RBC: 4.01 MIL/uL (ref 3.87–5.11)
RDW: 12.2 % (ref 11.5–15.5)
WBC: 6.4 10*3/uL (ref 4.0–10.5)
nRBC: 0 % (ref 0.0–0.2)

## 2022-11-20 LAB — URINALYSIS, ROUTINE W REFLEX MICROSCOPIC
Bilirubin Urine: NEGATIVE
Glucose, UA: NEGATIVE mg/dL
Hgb urine dipstick: NEGATIVE
Ketones, ur: NEGATIVE mg/dL
Nitrite: NEGATIVE
Protein, ur: NEGATIVE mg/dL
Specific Gravity, Urine: 1.005 (ref 1.005–1.030)
pH: 7 (ref 5.0–8.0)

## 2022-11-20 LAB — TROPONIN I (HIGH SENSITIVITY)
Troponin I (High Sensitivity): 5 ng/L (ref <18)
Troponin I (High Sensitivity): 4 ng/L (ref <18)

## 2022-11-20 LAB — CBG MONITORING, ED: Glucose-Capillary: 137 mg/dL — ABNORMAL HIGH (ref 70–99)

## 2022-11-20 MED ORDER — SODIUM CHLORIDE 0.9 % IV BOLUS
500.0000 mL | Freq: Once | INTRAVENOUS | Status: AC
  Administered 2022-11-20: 500 mL via INTRAVENOUS

## 2022-11-20 MED ORDER — ONDANSETRON HCL 4 MG/2ML IJ SOLN
4.0000 mg | Freq: Once | INTRAMUSCULAR | Status: AC
  Administered 2022-11-20: 4 mg via INTRAVENOUS
  Filled 2022-11-20: qty 2

## 2022-11-20 NOTE — Discharge Instructions (Signed)
Please follow-up with your primary care provider regarding recent symptoms and ER visit.  Today your workup was reassuring and while you were placed on the cardiac monitor no abnormalities were noted.  You most likely had a vagal episode this morning in which the vagus nerve was stimulated.  Please monitor your symptoms and if symptoms change or worsen please return to ER.

## 2022-11-20 NOTE — ED Notes (Addendum)
Pt unable to urinate it at this time.

## 2022-11-20 NOTE — ED Provider Notes (Signed)
[place] EMERGENCY DEPARTMENT AT [facility] Provider Note   CSN: [id number] Arrival date & time: 11/20/22  4098     History  Chief Complaint  Patient presents with   Dizziness    Alison Cuevas is a 77 y.o. female history of stroke, thrombocytopenia, ICH, chronic provide VAC membrane presented after having a syncopal episode this morning.  Patient states few days ago she fell and hit the back of her head and had unremarkable workup done but this morning had an episode where she was sitting in her chair when she saw white, felt dizzy in which she felt unstable, and then lost consciousness for a few moments.  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  Patient states that she chronically has weakness on the right side due to her stroke.  Patient states that she felt nauseous afterwards but did not have any episodes of emesis and does not feel nauseous right now but states that she feels sore all over.  Patient syncopized in chair and states she did not hit her head or fall.  Patient denies chest pain, shortness of breath, vision changes, abdominal pain, neck pain, headache, new onset weakness, changes sensation/motor skills.  Last echo: 12/01/2021 LVEF 55 to 60%, trivial mitral valve regurg, tricuspid aortic valve with mild calcification  Home Medications Prior to Admission medications   Medication Sig Start Date End Date Taking? Authorizing Provider  acetaminophen (TYLENOL) 500 MG tablet Take 1 tablet (500 mg total) by mouth 3 (three) times daily. 02/19/20   Love, Evlyn Kanner, PA-C  ALPRAZolam (XANAX) 0.5 MG tablet TAKE 1 TABLET BY MOUTH THREE TIMES A DAY AS NEEDED FOR ANXIETY Patient taking differently: Take 0.5 mg by mouth at bedtime. 08/31/22   McGowen, Maryjean Morn, MD  aspirin EC 81 MG tablet Take 1 tablet (81 mg total) by mouth daily. Swallow whole. 04/20/20   Ihor Austin, NP  Biotin 5 MG TABS Take 5 mg by mouth daily.    [provider]  clobetasol  (TEMOVATE) 0.05 % external solution Apply topically. 01/13/22   [provider]  fluticasone (FLONASE) 50 MCG/ACT nasal spray Place 2 sprays into both nostrils at bedtime. Patient taking differently: Place 1 spray into both nostrils at bedtime. 05/17/16   McGowen, Maryjean Morn, MD  HYDROcodone-acetaminophen (NORCO/VICODIN) 5-325 MG tablet Take 1 tablet by mouth every 6 (six) hours as needed for moderate pain. Patient not taking: Reported on 11/17/2022 01/19/22   Jeoffrey Massed, MD  KLOR-CON M20 20 MEQ tablet TAKE 1 TABLET BY MOUTH EVERY DAY 08/31/22   McGowen, Maryjean Morn, MD  lisinopril (ZESTRIL) 10 MG tablet Take 1 tablet (10 mg total) by mouth daily. MUST KEEP APPT FOR FURTHER REFILLS 10/27/22   McGowen, Maryjean Morn, MD  loratadine (CLARITIN) 10 MG tablet Take 10 mg by mouth every other day.     [provider]  magic mouthwash (nystatin, lidocaine, diphenhydrAMINE, alum & mag hydroxide) suspension Swish and spit 10 mLs 4 (four) times daily. 09/16/22   McGowen, Maryjean Morn, MD  meclizine (ANTIVERT) 12.5 MG tablet Take 1 tablet (12.5 mg total) by mouth 3 (three) times daily. 1 tab po bid prn dizziness 10/24/22   McGowen, Maryjean Morn, MD  Multiple Minerals-Vitamins (CALCIUM-MAGNESIUM-ZINC-D3 PO) Take 1 tablet by mouth daily.     [provider]  nystatin ointment (MYCOSTATIN) Apply 1 application topically 2 (two) times daily. Apply to affected area for up to 7 days. 10/20/20   Clarita Crane, NP  pantoprazole (PROTONIX) 40 MG tablet TAKE 1 TABLET BY MOUTH EVERYDAY AT BEDTIME Patient taking differently: Take 40 mg by mouth at bedtime. 09/07/22   McGowen, Maryjean Morn, MD  polyethylene glycol (MIRALAX / GLYCOLAX) 17 g packet Take 17 g by mouth daily as needed.    [provider]  Sodium Fluoride (CLINPRO 5000) 1.1 % PSTE Take 1 Application by mouth at bedtime.    [provider]  tiZANidine (ZANAFLEX) 2 MG tablet Take 1 tablet (2 mg total) by mouth 3 (three) times daily. 10/26/22    Ranelle Oyster, MD  verapamil (CALAN-SR) 180 MG CR tablet Take 1 tablet (180 mg total) by mouth daily. 02/17/22   McGowen, Maryjean Morn, MD      Allergies    Gabapentin and Prednisone    Review of Systems   Review of Systems  Neurological:  Positive for dizziness.    Physical Exam Updated Vital Signs BP (!) 141/72   Pulse 96   Temp 98.3 F (36.8 C) (Oral)   Resp 14   Ht 5' (1.524 m)   Wt 51.3 kg   LMP 06/27/1972 Comment: not sexually active  SpO2 96%   BMI 22.07 kg/m  Physical Exam Constitutional:      General: She is not in acute distress. HENT:     Head: Normocephalic.     Left Ear: Tympanic membrane, ear canal and external ear normal.     Ears:     Comments: Right ear: Tympanic membrane appears to be perforated however this is chronic, no signs of drainage erythema or edema Eyes:     Extraocular Movements: Extraocular movements intact.     Conjunctiva/sclera: Conjunctivae normal.     Pupils: Pupils are equal, round, and reactive to light.  Cardiovascular:     Rate and Rhythm: Normal rate and regular rhythm.     Pulses: Normal pulses.     Heart sounds: Normal heart sounds.  Pulmonary:     Effort: Pulmonary effort is normal. No respiratory distress.     Breath sounds: Normal breath sounds.  Abdominal:     Palpations: Abdomen is soft.     Tenderness: There is no abdominal tenderness. There is no guarding or rebound.  Musculoskeletal:        General: Normal range of motion.     Comments: 5 out of 5 bilateral grip strength  Skin:    General: Skin is warm and dry.     Capillary Refill: Capillary refill takes less than 2 seconds.     Comments: No overlying skin color changes noted  Neurological:     Mental Status: She is alert and oriented to person, place, and time.     Sensory: Sensation is intact.     Motor: Motor function is intact.     Comments: Weak right hip flexion as opposed to left side however patient states this is chronic from her stroke Sensation  intact in all 4 limbs Vision grossly intact Cranial nerves III through XII intact  Psychiatric:     Comments: Slow speech but able to give thoughtful answers and speak in full sentences     ED Results / Procedures / Treatments   Labs (all labs ordered are listed, but only abnormal results are displayed) Labs Reviewed  CBC WITH DIFFERENTIAL/PLATELET - Abnormal; Notable for the following components:      Result Value   HCT 35.8 (*)    All other components within normal limits  COMPREHENSIVE METABOLIC PANEL - Abnormal;  Notable for the following components:   Sodium 127 (*)    Chloride 94 (*)    CO2 21 (*)    Glucose, Bld 125 (*)    All other components within normal limits  URINALYSIS, ROUTINE W REFLEX MICROSCOPIC - Abnormal; Notable for the following components:   APPearance HAZY (*)    Leukocytes,Ua LARGE (*)    Bacteria, UA RARE (*)    All other components within normal limits  CBG MONITORING, ED - Abnormal; Notable for the following components:   Glucose-Capillary 137 (*)    All other components within normal limits  TROPONIN I (HIGH SENSITIVITY)  TROPONIN I (HIGH SENSITIVITY)    EKG EKG Interpretation  Date/Time:  Sunday Nov 20 2022 09:19:49 EDT Ventricular Rate:  86 PR Interval:  182 QRS Duration: 133 QT Interval:  418 QTC Calculation: 500 R Axis:   27 Text Interpretation: Sinus rhythm Left bundle branch block No significant change since prior 5/23 Confirmed by Meridee Score [phone]) on 11/20/2022 9:21:31 AM  Radiology DG Chest Port 1 View  Result Date: 11/20/2022 CLINICAL DATA:  Syncope with fall. EXAM: PORTABLE CHEST 1 VIEW COMPARISON:  11/02/2021 FINDINGS: The lungs are clear without focal pneumonia, edema, pneumothorax or pleural effusion. The cardiopericardial silhouette is within normal limits for size. Bones are diffusely demineralized. Telemetry leads overlie the chest. IMPRESSION: No active disease. Electronically Signed   By: Kennith Center M.D.   On:  11/20/2022 10:21   CT Head Wo Contrast  Result Date: 11/20/2022 CLINICAL DATA:  Syncope.  Dizziness.  Status post fall. EXAM: CT HEAD WITHOUT CONTRAST TECHNIQUE: Contiguous axial images were obtained from the base of the skull through the vertex without intravenous contrast. RADIATION DOSE REDUCTION: This exam was performed according to the departmental dose-optimization program which includes automated exposure control, adjustment of the mA and/or kV according to patient size and/or use of iterative reconstruction technique. COMPARISON:  11/17/2022 FINDINGS: Brain: There is no evidence for acute hemorrhage, hydrocephalus, mass lesion, or abnormal extra-axial fluid collection. No definite CT evidence for acute infarction. Patchy low attenuation in the deep hemispheric and periventricular white matter is nonspecific, but likely reflects chronic microvascular ischemic demyelination. Stable appearance right parietal encephalomalacia towards the vertex consistent with remote infarct. Vascular: No hyperdense vessel or unexpected calcification. Skull: No evidence for fracture. No worrisome lytic or sclerotic lesion. Sinuses/Orbits: Chronic opacification left sphenoid sinus, similar to prior. Remaining visualized paranasal sinuses and left mastoid air cells are clear. Surgical changes noted right mastoid air cells with posterior right mastoid effusion, stable. Other: None. IMPRESSION: 1. No acute intracranial abnormality. 2. Stable appearance of right parietal encephalomalacia towards the vertex consistent with remote infarct. 3. Chronic left sphenoid sinusitis. Electronically Signed   By: Kennith Center M.D.   On: 11/20/2022 10:18    Procedures Procedures    Medications Ordered in ED Medications  ondansetron (ZOFRAN) injection 4 mg (4 mg Intravenous Given 11/20/22 1052)  sodium chloride 0.9 % bolus 500 mL (0 mLs Intravenous Stopped 11/20/22 1205)    ED Course/ Medical Decision Making/ A&P                              Medical Decision Making Amount and/or Complexity of Data Reviewed Labs: ordered. Radiology: ordered.  Risk Prescription drug management.   Marin Roberts 77 y.o. presented today for syncope. Working DDx that I considered at this time includes, but not limited to, orthostatic hypotension vs vasovagal episode,  arrhythmia, AS, ACS, PE, Pleural Effusion, Aortic Dissection, SAH, CVA/TIA, Vertebrobasilar Insufficiency, GIB, Trauma, OD, Sepsis, UTI, electrolyte abnormalities, anemia.  R/o DDx: arrhythmia, AS, ACS, PE, Pleural Effusion, Aortic Dissection, SAH, CVA/TIA, Vertebrobasilar Insufficiency, GIB, Trauma, OD, Sepsis, UTI, electrolyte abnormalities, anemia: These are considered less likely due to history of present illness and physical exam findings  Review of prior external notes: 11/17/2022 ED  Unique Tests and My Interpretation:  CBC: Unremarkable CMP: Slightly hyponatremic at 127 CBG: Unremarkable EKG: Sinus tach 6 bpm, left bundle branch block noted on previous EKGs Troponin: Negative CXR: No acute cardiopulmonary changes CT Head w/o Contrast: Chronic left sphenoid sinusitis, stable right encephalomalacia secondary to previous stroke, no acute findings UA: Leukocytes however patient is not endorsing any urinary symptoms and did not have any CVA tenderness  Discussion with Independent Historian:  Son  Discussion of Management of Tests: None  Risk: Low: based on diagnostic testing/clinical impression and treatment plan  Risk Stratification Score: none  Staffed with none  Plan: Patient presented for syncope. On exam patient was in no acute distress and had stable vitals.  Patient did have right leg weakness however states that this is chronic from her stroke.  Upon chart review her stroke was on the left side of her brain and so this fits with patient's story.  Patient did no prodromal symptoms and endorses history of vagal nerve syndrome and so high suspicion patient  had a vagal episode.  Due to patient's age and past medical history labs and imaging were drawn along with an EKG given the patient had reassuring workup 3 days ago.  Patient stable at this time.  Patient's BMP came back showing hyponatremia 127.  Patient's echo from 2023 was reassuring so patient will receive 500 mL of fluids.  Patient also stated that she began feeling nauseous while in ED and will be given Zofran.  Patient's labs and imaging were all reassuring.  I spoke to the son who states he has someone coming over to stay with the patient at the house.  I strongly encouraged the patient to follow-up with her primary care provider due to recent fall 3 days ago and her syncopal episode today.  I spoke to the son and patient about precautionary measures that he may take at the household.  Low suspicion for life-threatening diagnosis at this time and that this is most likely vasovagal due to prodromal symptoms.  Patient was given return precautions. Patient stable for discharge at this time.  Patient verbalized understanding of plan.         Final Clinical Impression(s) / ED Diagnoses Final diagnoses:  Vasovagal episode    Rx / DC Orders ED Discharge Orders     None         Remi Deter 11/20/22 1241    Terrilee Files, MD 11/20/22 [phone]

## 2022-11-20 NOTE — ED Triage Notes (Signed)
Pt BIB [facility] for c/o dizziness and syncopal episode; pt states she fell a couple of days ago and today she had sudden onset of dizziness, syncopal episode and nausea  Pt c/o feeling sore all over after the fall

## 2022-11-22 ENCOUNTER — Emergency Department (HOSPITAL_COMMUNITY): Payer: Medicare Other

## 2022-11-22 ENCOUNTER — Other Ambulatory Visit: Payer: Self-pay

## 2022-11-22 ENCOUNTER — Inpatient Hospital Stay (HOSPITAL_COMMUNITY)
Admission: EM | Admit: 2022-11-22 | Discharge: 2022-11-25 | DRG: 689 | Disposition: A | Discharge status: Skilled Nursing Facility | Payer: Medicare Other | Attending: Internal Medicine | Admitting: Internal Medicine

## 2022-11-22 ENCOUNTER — Ambulatory Visit: Payer: Medicare Other | Admitting: Nurse Practitioner

## 2022-11-22 ENCOUNTER — Telehealth: Payer: Self-pay | Admitting: Family Medicine

## 2022-11-22 ENCOUNTER — Encounter (HOSPITAL_COMMUNITY): Payer: Self-pay | Admitting: *Deleted

## 2022-11-22 DIAGNOSIS — B9561 Methicillin susceptible Staphylococcus aureus infection as the cause of diseases classified elsewhere: Secondary | ICD-10-CM | POA: Diagnosis present

## 2022-11-22 DIAGNOSIS — Z79899 Other long term (current) drug therapy: Secondary | ICD-10-CM

## 2022-11-22 DIAGNOSIS — M4854XA Collapsed vertebra, not elsewhere classified, thoracic region, initial encounter for fracture: Secondary | ICD-10-CM | POA: Diagnosis present

## 2022-11-22 DIAGNOSIS — I700 Atherosclerosis of aorta: Secondary | ICD-10-CM | POA: Diagnosis not present

## 2022-11-22 DIAGNOSIS — G894 Chronic pain syndrome: Secondary | ICD-10-CM | POA: Diagnosis present

## 2022-11-22 DIAGNOSIS — E43 Unspecified severe protein-calorie malnutrition: Secondary | ICD-10-CM | POA: Diagnosis present

## 2022-11-22 DIAGNOSIS — I69351 Hemiplegia and hemiparesis following cerebral infarction affecting right dominant side: Secondary | ICD-10-CM

## 2022-11-22 DIAGNOSIS — K5900 Constipation, unspecified: Secondary | ICD-10-CM | POA: Diagnosis present

## 2022-11-22 DIAGNOSIS — I10 Essential (primary) hypertension: Secondary | ICD-10-CM | POA: Diagnosis present

## 2022-11-22 DIAGNOSIS — Z808 Family history of malignant neoplasm of other organs or systems: Secondary | ICD-10-CM

## 2022-11-22 DIAGNOSIS — R1084 Generalized abdominal pain: Secondary | ICD-10-CM | POA: Diagnosis not present

## 2022-11-22 DIAGNOSIS — F419 Anxiety disorder, unspecified: Secondary | ICD-10-CM | POA: Diagnosis present

## 2022-11-22 DIAGNOSIS — Z90710 Acquired absence of both cervix and uterus: Secondary | ICD-10-CM

## 2022-11-22 DIAGNOSIS — S22000A Wedge compression fracture of unspecified thoracic vertebra, initial encounter for closed fracture: Secondary | ICD-10-CM

## 2022-11-22 DIAGNOSIS — R55 Syncope and collapse: Secondary | ICD-10-CM | POA: Diagnosis not present

## 2022-11-22 DIAGNOSIS — Z806 Family history of leukemia: Secondary | ICD-10-CM

## 2022-11-22 DIAGNOSIS — M8580 Other specified disorders of bone density and structure, unspecified site: Secondary | ICD-10-CM | POA: Diagnosis present

## 2022-11-22 DIAGNOSIS — M25552 Pain in left hip: Secondary | ICD-10-CM

## 2022-11-22 DIAGNOSIS — Z981 Arthrodesis status: Secondary | ICD-10-CM

## 2022-11-22 DIAGNOSIS — Z888 Allergy status to other drugs, medicaments and biological substances status: Secondary | ICD-10-CM

## 2022-11-22 DIAGNOSIS — E7800 Pure hypercholesterolemia, unspecified: Secondary | ICD-10-CM | POA: Diagnosis present

## 2022-11-22 DIAGNOSIS — I447 Left bundle-branch block, unspecified: Secondary | ICD-10-CM | POA: Diagnosis present

## 2022-11-22 DIAGNOSIS — Z6821 Body mass index (BMI) 21.0-21.9, adult: Secondary | ICD-10-CM

## 2022-11-22 DIAGNOSIS — N182 Chronic kidney disease, stage 2 (mild): Secondary | ICD-10-CM | POA: Diagnosis present

## 2022-11-22 DIAGNOSIS — Z7982 Long term (current) use of aspirin: Secondary | ICD-10-CM

## 2022-11-22 DIAGNOSIS — Z803 Family history of malignant neoplasm of breast: Secondary | ICD-10-CM

## 2022-11-22 DIAGNOSIS — Z832 Family history of diseases of the blood and blood-forming organs and certain disorders involving the immune mechanism: Secondary | ICD-10-CM

## 2022-11-22 DIAGNOSIS — Z8672 Personal history of thrombophlebitis: Secondary | ICD-10-CM

## 2022-11-22 DIAGNOSIS — Z9181 History of falling: Secondary | ICD-10-CM

## 2022-11-22 DIAGNOSIS — Z1152 Encounter for screening for COVID-19: Secondary | ICD-10-CM

## 2022-11-22 DIAGNOSIS — K5730 Diverticulosis of large intestine without perforation or abscess without bleeding: Secondary | ICD-10-CM | POA: Diagnosis not present

## 2022-11-22 DIAGNOSIS — G9341 Metabolic encephalopathy: Secondary | ICD-10-CM | POA: Diagnosis present

## 2022-11-22 DIAGNOSIS — K3189 Other diseases of stomach and duodenum: Secondary | ICD-10-CM | POA: Diagnosis not present

## 2022-11-22 DIAGNOSIS — I129 Hypertensive chronic kidney disease with stage 1 through stage 4 chronic kidney disease, or unspecified chronic kidney disease: Secondary | ICD-10-CM | POA: Diagnosis present

## 2022-11-22 DIAGNOSIS — M25559 Pain in unspecified hip: Secondary | ICD-10-CM

## 2022-11-22 DIAGNOSIS — N390 Urinary tract infection, site not specified: Secondary | ICD-10-CM | POA: Diagnosis not present

## 2022-11-22 DIAGNOSIS — R296 Repeated falls: Secondary | ICD-10-CM | POA: Diagnosis present

## 2022-11-22 DIAGNOSIS — K219 Gastro-esophageal reflux disease without esophagitis: Secondary | ICD-10-CM | POA: Diagnosis present

## 2022-11-22 DIAGNOSIS — Z8249 Family history of ischemic heart disease and other diseases of the circulatory system: Secondary | ICD-10-CM

## 2022-11-22 DIAGNOSIS — Z8349 Family history of other endocrine, nutritional and metabolic diseases: Secondary | ICD-10-CM

## 2022-11-22 DIAGNOSIS — R627 Adult failure to thrive: Secondary | ICD-10-CM | POA: Diagnosis present

## 2022-11-22 DIAGNOSIS — E871 Hypo-osmolality and hyponatremia: Secondary | ICD-10-CM | POA: Diagnosis present

## 2022-11-22 DIAGNOSIS — E860 Dehydration: Secondary | ICD-10-CM | POA: Diagnosis present

## 2022-11-22 DIAGNOSIS — M545 Low back pain, unspecified: Secondary | ICD-10-CM | POA: Diagnosis not present

## 2022-11-22 DIAGNOSIS — Z8042 Family history of malignant neoplasm of prostate: Secondary | ICD-10-CM

## 2022-11-22 DIAGNOSIS — Z800 Family history of malignant neoplasm of digestive organs: Secondary | ICD-10-CM

## 2022-11-22 DIAGNOSIS — M5030 Other cervical disc degeneration, unspecified cervical region: Secondary | ICD-10-CM | POA: Diagnosis present

## 2022-11-22 DIAGNOSIS — W07XXXA Fall from chair, initial encounter: Secondary | ICD-10-CM | POA: Diagnosis present

## 2022-11-22 DIAGNOSIS — Z9049 Acquired absence of other specified parts of digestive tract: Secondary | ICD-10-CM

## 2022-11-22 LAB — URINALYSIS, ROUTINE W REFLEX MICROSCOPIC
Bilirubin Urine: NEGATIVE
Glucose, UA: NEGATIVE mg/dL
Ketones, ur: NEGATIVE mg/dL
Nitrite: POSITIVE — AB
Protein, ur: NEGATIVE mg/dL
Specific Gravity, Urine: 1.006 (ref 1.005–1.030)
pH: 7 (ref 5.0–8.0)

## 2022-11-22 LAB — BASIC METABOLIC PANEL
Anion gap: 10 (ref 5–15)
BUN: 11 mg/dL (ref 8–23)
CO2: 23 mmol/L (ref 22–32)
Calcium: 9 mg/dL (ref 8.9–10.3)
Chloride: 93 mmol/L — ABNORMAL LOW (ref 98–111)
Creatinine, Ser: 0.58 mg/dL (ref 0.44–1.00)
GFR, Estimated: >60 mL/min (ref >60)
Glucose, Bld: 101 mg/dL — ABNORMAL HIGH (ref 70–99)
Potassium: 4 mmol/L (ref 3.5–5.1)
Sodium: 126 mmol/L — ABNORMAL LOW (ref 135–145)

## 2022-11-22 LAB — CBC WITH DIFFERENTIAL/PLATELET
Abs Immature Granulocytes: 0.02 10*3/uL (ref 0.00–0.07)
Basophils Absolute: 0 10*3/uL (ref 0.0–0.1)
Basophils Relative: 0 %
Eosinophils Absolute: 0 10*3/uL (ref 0.0–0.5)
Eosinophils Relative: 1 %
HCT: 33 % — ABNORMAL LOW (ref 36.0–46.0)
Hemoglobin: 11.9 g/dL — ABNORMAL LOW (ref 12.0–15.0)
Immature Granulocytes: 0 %
Lymphocytes Relative: 26 %
Lymphs Abs: 1.8 10*3/uL (ref 0.7–4.0)
MCH: 31.7 pg (ref 26.0–34.0)
MCHC: 36.1 g/dL — ABNORMAL HIGH (ref 30.0–36.0)
MCV: 88 fL (ref 80.0–100.0)
Monocytes Absolute: 0.4 10*3/uL (ref 0.1–1.0)
Monocytes Relative: 6 %
Neutro Abs: 4.7 10*3/uL (ref 1.7–7.7)
Neutrophils Relative %: 67 %
Platelets: 169 10*3/uL (ref 150–400)
RBC: 3.75 MIL/uL — ABNORMAL LOW (ref 3.87–5.11)
RDW: 12.2 % (ref 11.5–15.5)
WBC: 6.9 10*3/uL (ref 4.0–10.5)
nRBC: 0 % (ref 0.0–0.2)

## 2022-11-22 MED ORDER — ONDANSETRON HCL 4 MG/2ML IJ SOLN
4.0000 mg | Freq: Once | INTRAMUSCULAR | Status: AC
  Administered 2022-11-22: 4 mg via INTRAVENOUS
  Filled 2022-11-22: qty 2

## 2022-11-22 MED ORDER — ALPRAZOLAM 0.5 MG PO TABS
0.5000 mg | ORAL_TABLET | Freq: Every day | ORAL | Status: DC
  Administered 2022-11-23 – 2022-11-24 (×3): 0.5 mg via ORAL
  Filled 2022-11-22 (×3): qty 1

## 2022-11-22 MED ORDER — TIZANIDINE HCL 2 MG PO TABS: 2.0000 mg | ORAL_TABLET | Freq: Three times a day (TID) | ORAL | Status: DC

## 2022-11-22 MED ORDER — POLYETHYLENE GLYCOL 3350 17 G PO PACK
17.0000 g | PACK | Freq: Two times a day (BID) | ORAL | Status: DC
  Administered 2022-11-23 – 2022-11-25 (×4): 17 g via ORAL
  Filled 2022-11-22 (×6): qty 1

## 2022-11-22 MED ORDER — MORPHINE SULFATE (PF) 2 MG/ML IV SOLN
2.0000 mg | INTRAVENOUS | Status: DC | PRN
  Administered 2022-11-23 – 2022-11-25 (×2): 2 mg via INTRAVENOUS
  Filled 2022-11-22 (×2): qty 1

## 2022-11-22 MED ORDER — SODIUM CHLORIDE 0.9 % IV BOLUS
500.0000 mL | Freq: Once | INTRAVENOUS | Status: AC
  Administered 2022-11-22: 500 mL via INTRAVENOUS

## 2022-11-22 MED ORDER — LISINOPRIL 10 MG PO TABS
10.0000 mg | ORAL_TABLET | Freq: Every day | ORAL | Status: DC
  Administered 2022-11-23 – 2022-11-25 (×3): 10 mg via ORAL
  Filled 2022-11-22 (×3): qty 1

## 2022-11-22 MED ORDER — HEPARIN SODIUM (PORCINE) 5000 UNIT/ML IJ SOLN
5000.0000 [IU] | Freq: Three times a day (TID) | INTRAMUSCULAR | Status: DC
  Administered 2022-11-23 – 2022-11-25 (×7): 5000 [IU] via SUBCUTANEOUS
  Filled 2022-11-22 (×7): qty 1

## 2022-11-22 MED ORDER — SODIUM CHLORIDE 0.9 % IV SOLN: INTRAVENOUS | Status: DC

## 2022-11-22 MED ORDER — OXYCODONE HCL 5 MG PO TABS
5.0000 mg | ORAL_TABLET | ORAL | Status: DC | PRN
  Administered 2022-11-23 – 2022-11-24 (×2): 5 mg via ORAL
  Filled 2022-11-22 (×2): qty 1

## 2022-11-22 MED ORDER — ONDANSETRON HCL 4 MG PO TABS: 4.0000 mg | ORAL_TABLET | Freq: Four times a day (QID) | ORAL | Status: DC | PRN

## 2022-11-22 MED ORDER — ONDANSETRON HCL 4 MG/2ML IJ SOLN: 4.0000 mg | Freq: Four times a day (QID) | INTRAMUSCULAR | Status: DC | PRN

## 2022-11-22 MED ORDER — SODIUM CHLORIDE 0.9 % IV SOLN
1.0000 g | Freq: Once | INTRAVENOUS | Status: AC
  Administered 2022-11-22: 1 g via INTRAVENOUS
  Filled 2022-11-22: qty 10

## 2022-11-22 MED ORDER — PANTOPRAZOLE SODIUM 40 MG PO TBEC
40.0000 mg | DELAYED_RELEASE_TABLET | Freq: Every day | ORAL | Status: DC
  Administered 2022-11-23 – 2022-11-24 (×3): 40 mg via ORAL
  Filled 2022-11-22 (×3): qty 1

## 2022-11-22 MED ORDER — VERAPAMIL HCL ER 180 MG PO TBCR
180.0000 mg | EXTENDED_RELEASE_TABLET | Freq: Every day | ORAL | Status: DC
  Administered 2022-11-23 – 2022-11-25 (×3): 180 mg via ORAL
  Filled 2022-11-22 (×3): qty 1

## 2022-11-22 MED ORDER — ACETAMINOPHEN 650 MG RE SUPP: 650.0000 mg | Freq: Four times a day (QID) | RECTAL | Status: DC | PRN

## 2022-11-22 MED ORDER — MORPHINE SULFATE (PF) 4 MG/ML IV SOLN
4.0000 mg | Freq: Once | INTRAVENOUS | Status: AC
  Administered 2022-11-22: 4 mg via INTRAVENOUS
  Filled 2022-11-22: qty 1

## 2022-11-22 MED ORDER — ASPIRIN 81 MG PO TBEC
81.0000 mg | DELAYED_RELEASE_TABLET | Freq: Every day | ORAL | Status: DC
  Administered 2022-11-23 – 2022-11-25 (×3): 81 mg via ORAL
  Filled 2022-11-22 (×3): qty 1

## 2022-11-22 MED ORDER — IOHEXOL 300 MG/ML  SOLN
100.0000 mL | Freq: Once | INTRAMUSCULAR | Status: AC | PRN
  Administered 2022-11-22: 100 mL via INTRAVENOUS

## 2022-11-22 MED ORDER — ACETAMINOPHEN 325 MG PO TABS
650.0000 mg | ORAL_TABLET | Freq: Four times a day (QID) | ORAL | Status: DC | PRN
  Administered 2022-11-23 – 2022-11-24 (×4): 650 mg via ORAL
  Filled 2022-11-22 (×4): qty 2

## 2022-11-22 MED ORDER — SODIUM CHLORIDE 0.9 % IV SOLN
1.0000 g | INTRAVENOUS | Status: DC
  Administered 2022-11-23: 1 g via INTRAVENOUS
  Filled 2022-11-22: qty 10

## 2022-11-22 NOTE — Telephone Encounter (Signed)
Patient unable to get x-ray done today due to having a bowel accident on the way to [facility]. She reports that she has been having bowel problems.

## 2022-11-22 NOTE — Telephone Encounter (Signed)
11/23/2022 appointment is good. Ask her which hip is hurting. Ask her if there is any possibility she can go to [facility] radiology today for a hip x-ray. If she can then please order DG hip for which ever side she says is hurting. Diagnosis hip pain and recent fall.  If she is unable to get the x-ray today then that is fine.

## 2022-11-22 NOTE — Telephone Encounter (Signed)
Pt states there is small bruise at butt crack, knot size of golf ball. Going across lower back and down left side of cheek. Hard to pinpoint pain sometimes. She has someone that can take her to [facility].

## 2022-11-22 NOTE — Telephone Encounter (Signed)
     Please review schedule for possible work in

## 2022-11-22 NOTE — ED Notes (Signed)
Pt assisted to bedside commode. Very little strength in her right leg, unable to bear weight unaided. High fall risk.

## 2022-11-22 NOTE — ED Triage Notes (Signed)
Pt BIB [facility] for c/o constipation and near syncope x2 -once while on the toilet. LBM this morning and first time in the past 3 days. + lower abd pain.

## 2022-11-22 NOTE — Telephone Encounter (Signed)
Spoke with Pt caregiver Darlene and notified her of Alcide Evener NP recommendations: Pt was rescheduled for 12/08/2022 at 10:00 AM  with Alcide Evener NP. Darlene made aware. Darlene  verbalized understanding with all questions answered.

## 2022-11-22 NOTE — Telephone Encounter (Signed)
Patient is scheduled for tomorrow at 2:40.

## 2022-11-22 NOTE — ED Provider Notes (Signed)
[place] EMERGENCY DEPARTMENT AT [facility] Provider Note   CSN: [id number] Arrival date & time: 11/22/22  1748     History {Add pertinent medical, surgical, social history, OB history to HPI:1} Chief Complaint  Patient presents with   Near Syncope    Alison Cuevas is a 77 y.o. female.  She had a fall about 5 days ago.  Has been in the ED twice since then for pain and near syncope.  Continues to have pain in her left posterior chest and flank, left hip.  Usually is ambulatory with walker and now is needing more assistance and help even putting her legs up in the bed.  He has been more constipated although took some medicine and moved her bowels a little bit today.  She was here to get plain films that were ordered by her PCP but had worsening left-sided abdominal and flank pain and was brought over to the ER for further evaluation.  She denies any cough or shortness of breath no fevers or chills.  No urinary symptoms.  No numbness or focal weakness but is generally weak all over.  The history is provided by the patient and a relative.  Abdominal Pain Pain location:  L flank, LUQ and LLQ Pain quality: stabbing   Pain severity:  Severe Onset quality:  Sudden Timing:  Intermittent Progression:  Unchanged Chronicity:  New Context: trauma   Relieved by:  None tried Worsened by:  Movement Ineffective treatments:  None tried Associated symptoms: chest pain and constipation   Associated symptoms: no cough, no dysuria, no fever, no nausea, no shortness of breath and no vomiting        Home Medications Prior to Admission medications   Medication Sig Start Date End Date Taking? Authorizing Provider  acetaminophen (TYLENOL) 500 MG tablet Take 1 tablet (500 mg total) by mouth 3 (three) times daily. 02/19/20   Love, Evlyn Kanner, PA-C  ALPRAZolam (XANAX) 0.5 MG tablet TAKE 1 TABLET BY MOUTH THREE TIMES A DAY AS NEEDED FOR ANXIETY Patient taking differently: Take 0.5 mg by mouth at  bedtime. 08/31/22   McGowen, Maryjean Morn, MD  aspirin EC 81 MG tablet Take 1 tablet (81 mg total) by mouth daily. Swallow whole. 04/20/20   Ihor Austin, NP  Biotin 5 MG TABS Take 5 mg by mouth daily.    [provider]  clobetasol (TEMOVATE) 0.05 % external solution Apply topically. 01/13/22   [provider]  fluticasone (FLONASE) 50 MCG/ACT nasal spray Place 2 sprays into both nostrils at bedtime. Patient taking differently: Place 1 spray into both nostrils at bedtime. 05/17/16   McGowen, Maryjean Morn, MD  HYDROcodone-acetaminophen (NORCO/VICODIN) 5-325 MG tablet Take 1 tablet by mouth every 6 (six) hours as needed for moderate pain. Patient not taking: Reported on 11/17/2022 01/19/22   Jeoffrey Massed, MD  KLOR-CON M20 20 MEQ tablet TAKE 1 TABLET BY MOUTH EVERY DAY 08/31/22   McGowen, Maryjean Morn, MD  lisinopril (ZESTRIL) 10 MG tablet Take 1 tablet (10 mg total) by mouth daily. MUST KEEP APPT FOR FURTHER REFILLS 10/27/22   McGowen, Maryjean Morn, MD  loratadine (CLARITIN) 10 MG tablet Take 10 mg by mouth every other day.     [provider]  magic mouthwash (nystatin, lidocaine, diphenhydrAMINE, alum & mag hydroxide) suspension Swish and spit 10 mLs 4 (four) times daily. 09/16/22   McGowen, Maryjean Morn, MD  meclizine (ANTIVERT) 12.5 MG tablet Take 1 tablet (12.5 mg total) by mouth 3 (three)  times daily. 1 tab po bid prn dizziness 10/24/22   McGowen, Maryjean Morn, MD  Multiple Minerals-Vitamins (CALCIUM-MAGNESIUM-ZINC-D3 PO) Take 1 tablet by mouth daily.     [provider]  nystatin ointment (MYCOSTATIN) Apply 1 application topically 2 (two) times daily. Apply to affected area for up to 7 days. 10/20/20   Clarita Crane, NP  pantoprazole (PROTONIX) 40 MG tablet TAKE 1 TABLET BY MOUTH EVERYDAY AT BEDTIME Patient taking differently: Take 40 mg by mouth at bedtime. 09/07/22   McGowen, Maryjean Morn, MD  polyethylene glycol (MIRALAX / GLYCOLAX) 17 g packet Take 17 g by mouth daily as needed.     [provider]  Sodium Fluoride (CLINPRO 5000) 1.1 % PSTE Take 1 Application by mouth at bedtime.    [provider]  tiZANidine (ZANAFLEX) 2 MG tablet Take 1 tablet (2 mg total) by mouth 3 (three) times daily. 10/26/22   Ranelle Oyster, MD  verapamil (CALAN-SR) 180 MG CR tablet Take 1 tablet (180 mg total) by mouth daily. 02/17/22   McGowen, Maryjean Morn, MD      Allergies    Gabapentin and Prednisone    Review of Systems   Review of Systems  Constitutional:  Negative for fever.  Respiratory:  Negative for cough and shortness of breath.   Cardiovascular:  Positive for chest pain.  Gastrointestinal:  Positive for abdominal pain and constipation. Negative for nausea and vomiting.  Genitourinary:  Negative for dysuria.    Physical Exam Updated Vital Signs LMP 06/27/1972 Comment: not sexually active Physical Exam Vitals and nursing note reviewed.  Constitutional:      General: She is not in acute distress.    Appearance: Normal appearance. She is well-developed.  HENT:     Head: Normocephalic and atraumatic.  Eyes:     Conjunctiva/sclera: Conjunctivae normal.  Cardiovascular:     Rate and Rhythm: Normal rate and regular rhythm.     Heart sounds: No murmur heard. Pulmonary:     Effort: Pulmonary effort is normal. No respiratory distress.     Breath sounds: Normal breath sounds.  Abdominal:     Palpations: Abdomen is soft.     Tenderness: There is no abdominal tenderness. There is no guarding or rebound.  Musculoskeletal:        General: Tenderness (left hip and flank) present. No deformity. Normal range of motion.     Cervical back: Neck supple.  Skin:    General: Skin is warm and dry.     Capillary Refill: Capillary refill takes less than 2 seconds.  Neurological:     General: No focal deficit present.     Mental Status: She is alert.     Sensory: No sensory deficit.     Motor: No weakness.     ED Results / Procedures / Treatments   Labs (all labs  ordered are listed, but only abnormal results are displayed) Labs Reviewed - No data to display  EKG None  Radiology No results found.  Procedures Procedures  {Document cardiac monitor, telemetry assessment procedure when appropriate:1}  Medications Ordered in ED Medications  morphine (PF) 4 MG/ML injection 4 mg (has no administration in time range)  ondansetron (ZOFRAN) injection 4 mg (has no administration in time range)  sodium chloride 0.9 % bolus 500 mL (has no administration in time range)    ED Course/ Medical Decision Making/ A&P   {   Click here for ABCD2, HEART and other calculatorsREFRESH Note before signing :1}  Medical Decision Making Amount and/or Complexity of Data Reviewed Labs: ordered. Radiology: ordered.  Risk Prescription drug management.   This patient complains of ***; this involves an extensive number of treatment Options and is a complaint that carries with it a high risk of complications and morbidity. The differential includes ***  I ordered, reviewed and interpreted labs, which included *** I ordered medication *** and reviewed PMP when indicated. I ordered imaging studies which included *** and I independently    visualized and interpreted imaging which showed *** Additional history obtained from *** Previous records obtained and reviewed *** I consulted *** and discussed lab and imaging findings and discussed disposition.  Cardiac monitoring reviewed, *** Social determinants considered, *** Critical Interventions: ***  After the interventions stated above, I reevaluated the patient and found *** Admission and further testing considered, ***   {Document critical care time when appropriate:1} {Document review of labs and clinical decision tools ie heart score, Chads2Vasc2 etc:1}  {Document your independent review of radiology images, and any outside records:1} {Document your discussion with family members,  caretakers, and with consultants:1} {Document social determinants of health affecting pt's care:1} {Document your decision making why or why not admission, treatments were needed:1} Final Clinical Impression(s) / ED Diagnoses Final diagnoses:  None    Rx / DC Orders ED Discharge Orders     None

## 2022-11-22 NOTE — Telephone Encounter (Signed)
Tiaja's son Bobbiejean Mafi called to inform us that Alison Cuevas fell on Friday and have had a couple of ED visits since then. She was last seen in the ED on Sunday.  She had a CT scan and several other test done. She was released, however her son mentions that she has a knot on her tail bone and in pain especially her hip. I informed him that we did not have any appointments today, but maybe able to work her in today or sometime this week. Please give the patient a call to let them know if this request can be honored.

## 2022-11-23 ENCOUNTER — Encounter: Payer: Medicare Other | Admitting: Physical Medicine & Rehabilitation

## 2022-11-23 ENCOUNTER — Ambulatory Visit: Payer: Medicare Other | Admitting: Family Medicine

## 2022-11-23 DIAGNOSIS — I639 Cerebral infarction, unspecified: Secondary | ICD-10-CM | POA: Diagnosis not present

## 2022-11-23 DIAGNOSIS — W07XXXA Fall from chair, initial encounter: Secondary | ICD-10-CM | POA: Diagnosis present

## 2022-11-23 DIAGNOSIS — M797 Fibromyalgia: Secondary | ICD-10-CM | POA: Diagnosis not present

## 2022-11-23 DIAGNOSIS — M5030 Other cervical disc degeneration, unspecified cervical region: Secondary | ICD-10-CM | POA: Diagnosis present

## 2022-11-23 DIAGNOSIS — N182 Chronic kidney disease, stage 2 (mild): Secondary | ICD-10-CM | POA: Diagnosis present

## 2022-11-23 DIAGNOSIS — E43 Unspecified severe protein-calorie malnutrition: Secondary | ICD-10-CM | POA: Diagnosis present

## 2022-11-23 DIAGNOSIS — E7800 Pure hypercholesterolemia, unspecified: Secondary | ICD-10-CM | POA: Diagnosis present

## 2022-11-23 DIAGNOSIS — R627 Adult failure to thrive: Secondary | ICD-10-CM | POA: Diagnosis present

## 2022-11-23 DIAGNOSIS — R498 Other voice and resonance disorders: Secondary | ICD-10-CM | POA: Diagnosis not present

## 2022-11-23 DIAGNOSIS — M1389 Other specified arthritis, multiple sites: Secondary | ICD-10-CM | POA: Diagnosis not present

## 2022-11-23 DIAGNOSIS — N3000 Acute cystitis without hematuria: Secondary | ICD-10-CM | POA: Diagnosis not present

## 2022-11-23 DIAGNOSIS — K219 Gastro-esophageal reflux disease without esophagitis: Secondary | ICD-10-CM | POA: Diagnosis not present

## 2022-11-23 DIAGNOSIS — K5900 Constipation, unspecified: Secondary | ICD-10-CM | POA: Diagnosis present

## 2022-11-23 DIAGNOSIS — F411 Generalized anxiety disorder: Secondary | ICD-10-CM | POA: Diagnosis not present

## 2022-11-23 DIAGNOSIS — E871 Hypo-osmolality and hyponatremia: Secondary | ICD-10-CM

## 2022-11-23 DIAGNOSIS — Z79899 Other long term (current) drug therapy: Secondary | ICD-10-CM | POA: Diagnosis not present

## 2022-11-23 DIAGNOSIS — I10 Essential (primary) hypertension: Secondary | ICD-10-CM

## 2022-11-23 DIAGNOSIS — G894 Chronic pain syndrome: Secondary | ICD-10-CM | POA: Diagnosis present

## 2022-11-23 DIAGNOSIS — M6281 Muscle weakness (generalized): Secondary | ICD-10-CM | POA: Diagnosis not present

## 2022-11-23 DIAGNOSIS — I69351 Hemiplegia and hemiparesis following cerebral infarction affecting right dominant side: Secondary | ICD-10-CM | POA: Diagnosis not present

## 2022-11-23 DIAGNOSIS — Z9181 History of falling: Secondary | ICD-10-CM | POA: Diagnosis not present

## 2022-11-23 DIAGNOSIS — Z1152 Encounter for screening for COVID-19: Secondary | ICD-10-CM | POA: Diagnosis not present

## 2022-11-23 DIAGNOSIS — R55 Syncope and collapse: Secondary | ICD-10-CM

## 2022-11-23 DIAGNOSIS — G9341 Metabolic encephalopathy: Secondary | ICD-10-CM | POA: Diagnosis present

## 2022-11-23 DIAGNOSIS — I129 Hypertensive chronic kidney disease with stage 1 through stage 4 chronic kidney disease, or unspecified chronic kidney disease: Secondary | ICD-10-CM | POA: Diagnosis present

## 2022-11-23 DIAGNOSIS — I4510 Unspecified right bundle-branch block: Secondary | ICD-10-CM | POA: Diagnosis not present

## 2022-11-23 DIAGNOSIS — N390 Urinary tract infection, site not specified: Secondary | ICD-10-CM | POA: Diagnosis present

## 2022-11-23 DIAGNOSIS — E860 Dehydration: Secondary | ICD-10-CM | POA: Diagnosis present

## 2022-11-23 DIAGNOSIS — Z7982 Long term (current) use of aspirin: Secondary | ICD-10-CM | POA: Diagnosis not present

## 2022-11-23 DIAGNOSIS — Z6821 Body mass index (BMI) 21.0-21.9, adult: Secondary | ICD-10-CM | POA: Diagnosis not present

## 2022-11-23 DIAGNOSIS — M8580 Other specified disorders of bone density and structure, unspecified site: Secondary | ICD-10-CM | POA: Diagnosis present

## 2022-11-23 DIAGNOSIS — R488 Other symbolic dysfunctions: Secondary | ICD-10-CM | POA: Diagnosis not present

## 2022-11-23 DIAGNOSIS — R1030 Lower abdominal pain, unspecified: Secondary | ICD-10-CM | POA: Diagnosis not present

## 2022-11-23 DIAGNOSIS — R2681 Unsteadiness on feet: Secondary | ICD-10-CM | POA: Diagnosis not present

## 2022-11-23 DIAGNOSIS — M4854XA Collapsed vertebra, not elsewhere classified, thoracic region, initial encounter for fracture: Secondary | ICD-10-CM | POA: Diagnosis present

## 2022-11-23 DIAGNOSIS — R262 Difficulty in walking, not elsewhere classified: Secondary | ICD-10-CM | POA: Diagnosis not present

## 2022-11-23 DIAGNOSIS — R296 Repeated falls: Secondary | ICD-10-CM | POA: Diagnosis present

## 2022-11-23 DIAGNOSIS — R1312 Dysphagia, oropharyngeal phase: Secondary | ICD-10-CM | POA: Diagnosis not present

## 2022-11-23 DIAGNOSIS — E785 Hyperlipidemia, unspecified: Secondary | ICD-10-CM | POA: Diagnosis not present

## 2022-11-23 DIAGNOSIS — B9561 Methicillin susceptible Staphylococcus aureus infection as the cause of diseases classified elsewhere: Secondary | ICD-10-CM | POA: Diagnosis present

## 2022-11-23 DIAGNOSIS — I69951 Hemiplegia and hemiparesis following unspecified cerebrovascular disease affecting right dominant side: Secondary | ICD-10-CM | POA: Diagnosis not present

## 2022-11-23 DIAGNOSIS — F419 Anxiety disorder, unspecified: Secondary | ICD-10-CM | POA: Diagnosis present

## 2022-11-23 DIAGNOSIS — I447 Left bundle-branch block, unspecified: Secondary | ICD-10-CM | POA: Diagnosis present

## 2022-11-23 LAB — COMPREHENSIVE METABOLIC PANEL
ALT: 32 U/L (ref 0–44)
AST: 39 U/L (ref 15–41)
Albumin: 3.4 g/dL — ABNORMAL LOW (ref 3.5–5.0)
Alkaline Phosphatase: 67 U/L (ref 38–126)
Anion gap: 13 (ref 5–15)
BUN: 9 mg/dL (ref 8–23)
CO2: 21 mmol/L — ABNORMAL LOW (ref 22–32)
Calcium: 8.5 mg/dL — ABNORMAL LOW (ref 8.9–10.3)
Chloride: 96 mmol/L — ABNORMAL LOW (ref 98–111)
Creatinine, Ser: 0.62 mg/dL (ref 0.44–1.00)
GFR, Estimated: >60 mL/min (ref >60)
Glucose, Bld: 94 mg/dL (ref 70–99)
Potassium: 3.6 mmol/L (ref 3.5–5.1)
Sodium: 130 mmol/L — ABNORMAL LOW (ref 135–145)
Total Bilirubin: 1.2 mg/dL (ref 0.3–1.2)
Total Protein: 6.1 g/dL — ABNORMAL LOW (ref 6.5–8.1)

## 2022-11-23 LAB — CBC WITH DIFFERENTIAL/PLATELET
Abs Immature Granulocytes: 0.03 10*3/uL (ref 0.00–0.07)
Basophils Absolute: 0 10*3/uL (ref 0.0–0.1)
Basophils Relative: 0 %
Eosinophils Absolute: 0.1 10*3/uL (ref 0.0–0.5)
Eosinophils Relative: 1 %
HCT: 33.2 % — ABNORMAL LOW (ref 36.0–46.0)
Hemoglobin: 11.8 g/dL — ABNORMAL LOW (ref 12.0–15.0)
Immature Granulocytes: 0 %
Lymphocytes Relative: 15 %
Lymphs Abs: 1.2 10*3/uL (ref 0.7–4.0)
MCH: 31.5 pg (ref 26.0–34.0)
MCHC: 35.5 g/dL (ref 30.0–36.0)
MCV: 88.5 fL (ref 80.0–100.0)
Monocytes Absolute: 0.5 10*3/uL (ref 0.1–1.0)
Monocytes Relative: 6 %
Neutro Abs: 6.2 10*3/uL (ref 1.7–7.7)
Neutrophils Relative %: 78 %
Platelets: 167 10*3/uL (ref 150–400)
RBC: 3.75 MIL/uL — ABNORMAL LOW (ref 3.87–5.11)
RDW: 12.3 % (ref 11.5–15.5)
WBC: 8.1 10*3/uL (ref 4.0–10.5)
nRBC: 0 % (ref 0.0–0.2)

## 2022-11-23 LAB — FOLATE: Folate: 22.8 ng/mL (ref >5.9)

## 2022-11-23 LAB — MAGNESIUM: Magnesium: 1.8 mg/dL (ref 1.7–2.4)

## 2022-11-23 LAB — VITAMIN B12: Vitamin B-12: 2458 pg/mL — ABNORMAL HIGH (ref 180–914)

## 2022-11-23 LAB — T4, FREE: Free T4: 1.57 ng/dL — ABNORMAL HIGH (ref 0.61–1.12)

## 2022-11-23 LAB — TSH: TSH: 2.104 u[IU]/mL (ref 0.350–4.500)

## 2022-11-23 MED ORDER — ENSURE ENLIVE PO LIQD
237.0000 mL | Freq: Two times a day (BID) | ORAL | Status: DC
  Administered 2022-11-23 – 2022-11-25 (×4): 237 mL via ORAL

## 2022-11-23 MED ORDER — MAGIC MOUTHWASH
10.0000 mL | Freq: Four times a day (QID) | ORAL | Status: DC
  Administered 2022-11-23 – 2022-11-25 (×7): 10 mL via ORAL
  Filled 2022-11-23 (×22): qty 10

## 2022-11-23 MED ORDER — ADULT MULTIVITAMIN W/MINERALS CH
1.0000 | ORAL_TABLET | Freq: Every day | ORAL | Status: DC
  Administered 2022-11-23 – 2022-11-25 (×3): 1 via ORAL
  Filled 2022-11-23 (×3): qty 1

## 2022-11-23 NOTE — Evaluation (Signed)
Physical Therapy Evaluation Patient Details Name: Fruma Vieweg MRN: [id number] DOB: [birth year] Today's Date: 11/23/2022  History of Present Illness  Delphia Anhalt is a 77 y.o. female with medical history significant of GERD, degenerative disc disease, hyperlipidemia, hypertension, syncope, left bundle branch block, and more presents to the ED with a chief complaint of pain.  Chart review reveals that patient came in for near syncopal episode x 2.  Once was on the toilet.  Her last bowel movement was the a.m. of the presentation, but previous to that had been 3 days ago.  Patient had been complaining about lower abdominal pain.  Son told staff that they can no longer take care of patient at home and need her to be placed.  For me, patient is a poor historian and not able to give much to go on.  She reports that she came in for pain.  She reports pain in her neck because the bed is and what she is used to.  When it is pointed out to her that she can come in for pain in her neck caused by this bed because she would have had this bed at home she becomes irritated and says that she had pain in her back.  When she was reminded that she complained of pain in her stomach she said yes she had pain in her stomach 2.  She reports she has been constipated but she is not sure how long.  She does remember having a bowel movement the day of presentation.  She reports she was having abdominal pain for years because she has to pee.  She reports the only reason that her abdominal pain got any better because she had a bowel movement which is due to what she ate.  But then she reports her abdominal pain is no better.  Clearly is convoluted, and it is difficult to decipher what is accurate.  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   Clinical Impression  Patient demonstrates slow labored movement for sitting up at bedside, requires Max assist for donning right AFO, very unsteady on feet with frequent near loss  of balance due to weakness and poor standing balance.  Patient able to ambulate to bathroom, had a BM without c/o syncope and limited to walking in room before having to sit due to fatigue.  Patient tolerated sitting up in chair with her son present after therapy.  Patient will benefit from continued skilled physical therapy in hospital and recommended venue below to increase strength, balance, endurance for safe ADLs and gait.          Recommendations for follow up therapy are one component of a multi-disciplinary discharge planning process, led by the attending physician.  Recommendations may be updated based on patient status, additional functional criteria and insurance authorization.  Follow Up Recommendations Can patient physically be transported by private vehicle: Yes     Assistance Recommended at Discharge Set up Supervision/Assistance  Patient can return home with the following  A lot of help with bathing/dressing/bathroom;A lot of help with walking and/or transfers;Help with stairs or ramp for entrance;Assistance with cooking/housework    Equipment Recommendations None recommended by PT  Recommendations for Other Services       Functional Status Assessment Patient has had a recent decline in their functional status and demonstrates the ability to make significant improvements in function in a reasonable and predictable amount of time.     Precautions / Restrictions Precautions Precautions: Fall Required  Braces or Orthoses: Other Brace Other Brace: R AFO when standing and transfering. Restrictions Weight Bearing Restrictions: No      Mobility  Bed Mobility Overal bed mobility: Needs Assistance Bed Mobility: Supine to Sit     Supine to sit: Min assist, Mod assist     General bed mobility comments: increased time, labored movement    Transfers Overall transfer level: Needs assistance Equipment used: Rolling walker (2 wheels) Transfers: Sit to/from Stand, Bed to  chair/wheelchair/BSC Sit to Stand: Min assist   Step pivot transfers: Min assist, Mod assist       General transfer comment: unsteady labored movement with difficulty advancing RLE    Ambulation/Gait Ambulation/Gait assistance: Mod assist Gait Distance (Feet): 15 Feet Assistive device: Rolling walker (2 wheels) Gait Pattern/deviations: Decreased step length - right, Decreased step length - left, Decreased stance time - right, Decreased stride length, Antalgic Gait velocity: decreased     General Gait Details: slow labored unsteady cadence with diffiuclty advancing LLE due to weakness, requires right ankle AFO for taking steps and limited mostly due to fatigue and generalized weakness  Stairs            Wheelchair Mobility    Modified Rankin (Stroke Patients Only)       Balance Overall balance assessment: Needs assistance Sitting-balance support: Feet supported, No upper extremity supported Sitting balance-Leahy Scale: Fair Sitting balance - Comments: fair/good seated at EOB   Standing balance support: Reliant on assistive device for balance, During functional activity, Bilateral upper extremity supported Standing balance-Leahy Scale: Poor Standing balance comment: fair/poor using RW                             Pertinent Vitals/Pain Pain Assessment Pain Assessment: Faces Faces Pain Scale: Hurts little more Pain Location: "vagina" Pain Descriptors / Indicators: Grimacing, Discomfort Pain Intervention(s): Limited activity within patient's tolerance, Monitored during session, Repositioned    Home Living Family/patient expects to be discharged to:: Private residence Living Arrangements: Alone Available Help at Discharge: Family;Personal care attendant;Available PRN/intermittently Type of Home: House Home Access: Ramped entrance     Alternate Level Stairs-Number of Steps: 10 Home Layout: Able to live on main level with bedroom/bathroom;Two  level;Full bath on main level Home Equipment: Rollator (4 wheels);Rolling Walker (2 wheels);Shower seat;Grab bars - toilet;Wheelchair - manual Additional Comments: Planned to be evaluated for an electric chair. Pt has care attendant 4 days a week.    Prior Function Prior Level of Function : Needs assist       Physical Assist : Mobility (physical);ADLs (physical) Mobility (physical): Bed mobility;Transfers;Gait;Stairs ADLs (physical): IADLs;Bathing Mobility Comments: w/c mobility in the house primarily; able to use RW for transfers and some ambulation within the home. ADLs Comments: Assisted for bathing and shower transfer. Assisted for IADL's.     Hand Dominance   Dominant Hand: Left    Extremity/Trunk Assessment   Upper Extremity Assessment Upper Extremity Assessment: Defer to OT evaluation    Lower Extremity Assessment Lower Extremity Assessment: Generalized weakness;RLE deficits/detail RLE Deficits / Details: grossly -4/5 except ankle dorsiflexion 0/5 RLE Sensation: decreased proprioception RLE Coordination: decreased fine motor;decreased gross motor    Cervical / Trunk Assessment Cervical / Trunk Assessment: Kyphotic  Communication   Communication: No difficulties  Cognition Arousal/Alertness: Awake/alert Behavior During Therapy: WFL for tasks assessed/performed Overall Cognitive Status: Within Functional Limits for tasks assessed  General Comments      Exercises     Assessment/Plan    PT Assessment Patient needs continued PT services  PT Problem List Decreased strength;Decreased activity tolerance;Decreased balance;Decreased mobility       PT Treatment Interventions DME instruction;Gait training;Stair training;Functional mobility training;Therapeutic activities;Therapeutic exercise;Patient/family education;Balance training    PT Goals (Current goals can be found in the Care Plan section)  Acute  Rehab PT Goals Patient Stated Goal: return home after rehab PT Goal Formulation: With patient/family Time For Goal Achievement: 12/07/22 Potential to Achieve Goals: Good    Frequency Min 3X/week     Co-evaluation PT/OT/SLP Co-Evaluation/Treatment: Yes Reason for Co-Treatment: To address functional/ADL transfers PT goals addressed during session: Mobility/safety with mobility;Balance;Proper use of DME OT goals addressed during session: ADL's and self-care       AM-PAC PT "6 Clicks" Mobility  Outcome Measure Help needed turning from your back to your side while in a flat bed without using bedrails?: None Help needed moving from lying on your back to sitting on the side of a flat bed without using bedrails?: A Little Help needed moving to and from a bed to a chair (including a wheelchair)?: A Lot Help needed standing up from a chair using your arms (e.g., wheelchair or bedside chair)?: A Lot Help needed to walk in hospital room?: A Lot Help needed climbing 3-5 steps with a railing? : A Lot 6 Click Score: 15    End of Session   Activity Tolerance: Patient tolerated treatment well;Patient limited by fatigue Patient left: in chair;with call bell/phone within reach;with family/visitor present;with chair alarm set Nurse Communication: Mobility status PT Visit Diagnosis: Unsteadiness on feet (R26.81);Other abnormalities of gait and mobility (R26.89);Muscle weakness (generalized) (M62.81)    Time: 1610-9604 PT Time Calculation (min) (ACUTE ONLY): 31 min   Charges:   PT Evaluation $PT Eval Moderate Complexity: 1 Mod PT Treatments $Therapeutic Activity: 23-37 mins        12:30 PM, 11/23/22 Ocie Bob, MPT Physical Therapist with [facility] [phone] office [phone] mobile phone

## 2022-11-23 NOTE — Assessment & Plan Note (Signed)
Continue PPI ?

## 2022-11-23 NOTE — Plan of Care (Signed)
  Problem: Acute Rehab OT Goals (only OT should resolve) Goal: Pt. Will Perform Lower Body Bathing Flowsheets (Taken 11/23/2022 0937) Pt Will Perform Lower Body Bathing:  with modified independence  sitting/lateral leans Goal: Pt. Will Perform Lower Body Dressing Flowsheets (Taken 11/23/2022 0937) Pt Will Perform Lower Body Dressing:  with modified independence  sitting/lateral leans Goal: Pt. Will Transfer To Toilet Flowsheets (Taken 11/23/2022 [phone]) Pt Will Transfer to Toilet:  with modified independence  ambulating Goal: Pt. Will Perform Toileting-Clothing Manipulation Flowsheets (Taken 11/23/2022 0937) Pt Will Perform Toileting - Clothing Manipulation and hygiene:  with modified independence  sitting/lateral leans Goal: Pt/Caregiver Will Perform Home Exercise Program Flowsheets (Taken 11/23/2022 [phone]) Pt/caregiver will Perform Home Exercise Program:  Increased strength  Both right and left upper extremity  Independently  Flornce Record OT, MOT

## 2022-11-23 NOTE — Progress Notes (Addendum)
Initial Nutrition Assessment  DOCUMENTATION CODES:   Severe malnutrition in context of social or environmental circumstances  INTERVENTION:  Ensure Enlive po BID, strawberry flavor.   Multivitamin and minerals daily (suspect deficiency given her poor oral intake)  Malnutrition Adult - handout attached to AVS  NUTRITION DIAGNOSIS:   Severe Malnutrition related to social / environmental circumstances (decline in status the past few months per family and has expericenced significant unplanned weight loss) as evidenced by energy intake < or equal to 75% for > or equal to 1 month, severe fat depletion, severe muscle depletion, percent weight loss, per patient/family report.   GOAL:  Patient will meet greater than or equal to 90% of their needs   MONITOR:  PO intake, Supplement acceptance, Weight trends, Labs  REASON FOR ASSESSMENT:   Consult, Malnutrition Screening Tool Assessment of nutrition requirement/status  ASSESSMENT: Patient is a 77 yo female from home with history of HTN, stroke (right sided weakness) and recent fall. Presents with altered mental status, weakness, acute metabolic encephalopathy, Failure to thrive, UTI and hyponatremia. Expected SNF at discharge per chart.   Patient family bedside. She is a pleasant lady who "eats what I want to". Family member says, that is very little food. According to family patient thinks she is eating better than she is. She receives Meals on Wheels and has a someone who helps her during the day. Able to feed herself but requires encouragement.   Based on chart review and family recall; her weight has been trending down since February (9% x 3 months) from 54.9 kg to current 49.8 kg which is significant for timeframe.   Medications: xanax, miralax BID, protonix.   IV -rocephin.       Latest Ref Rng & Units 11/23/2022    4:26 AM 11/22/2022    7:14 PM 11/20/2022    9:39 AM  BMP  Glucose 70 - 99 mg/dL 94  161  096   BUN 8 - 23 mg/dL 9   11  11    Creatinine 0.44 - 1.00 mg/dL 0.45  4.09  8.11   Sodium 135 - 145 mmol/L 130  126  127   Potassium 3.5 - 5.1 mmol/L 3.6  4.0  3.9   Chloride 98 - 111 mmol/L 96  93  94   CO2 22 - 32 mmol/L 21  23  21    Calcium 8.9 - 10.3 mg/dL 8.5  9.0  9.1      NUTRITION - FOCUSED PHYSICAL EXAM:  NFPE conducted findings are moderate orbital and buccal, severe triceps/biceps fat depletion, severe clavicle, deltoid and interosseous muscle depletion and no edema.   Diet Order:   Diet Order             Diet Heart Room service appropriate? Yes; Fluid consistency: Thin  Diet effective now                   EDUCATION NEEDS:  Education needs have been addressed  Skin:  Skin Assessment: Reviewed RN Assessment  Last BM:  unknown- moderate stool burden at admission per chart  Height:   Ht Readings from Last 1 Encounters:  11/22/22 5' (1.524 m)    Weight:   Wt Readings from Last 1 Encounters:  11/22/22 49.8 kg    Ideal Body Weight:   45 kg  BMI:  Body mass index is 21.44 kg/m.  Estimated Nutritional Needs:   Kcal:  1500-1700  Protein:  75-80 gr  Fluid:  1.5 liters daily  Royann Shivers MS,RD,CSG,LDN Contact: Loretha Stapler

## 2022-11-23 NOTE — Assessment & Plan Note (Signed)
-   Sodium down to 126 - Continue IV NS - Trend in the a.m.

## 2022-11-23 NOTE — [place] FL2 (Signed)
[place] MEDICAID [id number] LEVEL OF CARE FORM     IDENTIFICATION  Patient Name: Alison Cuevas Birthdate: Mar 25, 1946 Sex: female Admission Date (Current Location): 11/22/2022  [facility] and [place] Number:  [employer] and Address:  [facility],  [address] [place] [address]      Provider Number: [id number]  Attending Physician Name and Address:  Catarina Hartshorn, MD  Relative Name and Phone Number:       Current Level of Care:   Recommended Level of Care: Skilled Nursing Facility Prior Approval Number:    Date Approved/Denied:   PASRR Number: [id number]  Discharge Plan: SNF    Current Diagnoses: Patient Active Problem List   Diagnosis Date Noted   Near syncope 11/22/2022   Thyroid nodule 07/08/2021   Thrombocytopenia, unspecified (HCC) 11/19/2020   Pain in joint of right foot 04/15/2020   Right spastic hemiparesis (HCC) 03/04/2020   Chronic otitis externa of right ear 02/20/2020   Intraparenchymal hemorrhage of brain (HCC)    Delirium    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, hypertensive 01/26/2020   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   Conductive hearing loss of right ear with unrestricted hearing of left ear 05/28/2019   Hyponatremia 08/31/2017   Generalized weakness 08/31/2017   Dehydration 08/31/2017   UTI (urinary tract infection) 08/31/2017   Tympanic membrane rupture, right 12/01/2016   Right ovarian cyst 08/22/2016   Fibromyalgia 06/03/2015   Cystocele 03/18/2014   Tick-borne disease 12/25/2013   Diverticulosis of colon without hemorrhage 06/12/2013   Hypertension    Osteopenia    Chronic low back pain 05/12/2011   HYPERCHOLESTEROLEMIA 03/17/2008   Anxiety state 09/17/2007   GERD 09/17/2007   DJD (degenerative joint disease) 09/17/2007    Orientation RESPIRATION BLADDER Height & Weight     Self, Time, Situation, Place  Normal External catheter Weight: 109 lb 12.6 oz (49.8 kg) Height:  5' (152.4 cm)   BEHAVIORAL SYMPTOMS/MOOD NEUROLOGICAL BOWEL NUTRITION STATUS      Continent Diet (Heart healthy. See d/c summary for updates.)  AMBULATORY STATUS COMMUNICATION OF NEEDS Skin   Extensive Assist Verbally Bruising, Other (Comment) (Redness to sacrum)                       Personal Care Assistance Level of Assistance  Bathing, Feeding, Dressing Bathing Assistance: Maximum assistance Feeding assistance: Limited assistance Dressing Assistance: Maximum assistance     Functional Limitations Info  Sight, Hearing, Speech Sight Info: Adequate Hearing Info: Adequate Speech Info: Adequate    SPECIAL CARE FACTORS FREQUENCY  PT (By licensed PT)     PT Frequency: 5x weekly              Contractures      Additional Factors Info  Code Status, Allergies, Psychotropic Code Status Info: Full code Allergies Info: Gabapentin, Prednisone Psychotropic Info: Xanax         Current Medications (11/23/2022):  This is the current hospital active medication list Current Facility-Administered Medications  Medication Dose Route Frequency Provider Last Rate Last Admin   0.9 %  sodium chloride infusion   Intravenous Continuous Zierle-Ghosh, Asia B, DO 75 mL/hr at 11/23/22 0139 New Bag at 11/23/22 0139   acetaminophen (TYLENOL) tablet 650 mg  650 mg Oral Q6H PRN Zierle-Ghosh, Asia B, DO   650 mg at 11/23/22 5621   Or   acetaminophen (TYLENOL) suppository 650 mg  650 mg Rectal Q6H PRN Zierle-Ghosh, Asia B, DO  ALPRAZolam Prudy Feeler) tablet 0.5 mg  0.5 mg Oral QHS Zierle-Ghosh, Asia B, DO   0.5 mg at 11/23/22 0052   aspirin EC tablet 81 mg  81 mg Oral Daily Zierle-Ghosh, Asia B, DO   81 mg at 11/23/22 0927   cefTRIAXone (ROCEPHIN) 1 g in sodium chloride 0.9 % 100 mL IVPB  1 g Intravenous Q24H Zierle-Ghosh, Asia B, DO       heparin injection 5,000 Units  5,000 Units Subcutaneous Q8H Zierle-Ghosh, Asia B, DO   5,000 Units at 11/23/22 0558   lisinopril (ZESTRIL) tablet 10 mg  10 mg Oral Daily  Zierle-Ghosh, Asia B, DO   10 mg at 11/23/22 0932   morphine (PF) 2 MG/ML injection 2 mg  2 mg Intravenous Q2H PRN Zierle-Ghosh, Asia B, DO   2 mg at 11/23/22 0140   ondansetron (ZOFRAN) tablet 4 mg  4 mg Oral Q6H PRN Zierle-Ghosh, Asia B, DO       Or   ondansetron (ZOFRAN) injection 4 mg  4 mg Intravenous Q6H PRN Zierle-Ghosh, Asia B, DO       oxyCODONE (Oxy IR/ROXICODONE) immediate release tablet 5 mg  5 mg Oral Q4H PRN Zierle-Ghosh, Asia B, DO   5 mg at 11/23/22 0927   pantoprazole (PROTONIX) EC tablet 40 mg  40 mg Oral QHS Zierle-Ghosh, Asia B, DO   40 mg at 11/23/22 0052   polyethylene glycol (MIRALAX / GLYCOLAX) packet 17 g  17 g Oral BID Zierle-Ghosh, Asia B, DO   17 g at 11/23/22 0926   verapamil (CALAN-SR) CR tablet 180 mg  180 mg Oral Daily Zierle-Ghosh, Asia B, DO   180 mg at 11/23/22 3557     Discharge Medications: Please see discharge summary for a list of discharge medications.  Relevant Imaging Results:  Relevant Lab Results:   Additional Information SSN: [id number]  Karn Cassis, LCSW

## 2022-11-23 NOTE — Evaluation (Addendum)
Occupational Therapy Evaluation Patient Details Name: Alison Cuevas MRN: [id number] DOB: [birth year] Today's Date: 11/23/2022   History of Present Illness Alison Cuevas Service is a 77 y.o. female with medical history significant of GERD, degenerative disc disease, hyperlipidemia, hypertension, syncope, left bundle branch block, and more presents to the ED with a chief complaint of pain.  Chart review reveals that patient came in for near syncopal episode x 2.  Once was on the toilet.  Her last bowel movement was the a.m. of the presentation, but previous to that had been 3 days ago.  Patient had been complaining about lower abdominal pain.  Son told staff that they can no longer take care of patient at home and need her to be placed.  For me, patient is a poor historian and not able to give much to go on.  She reports that she came in for pain.  She reports pain in her neck because the bed is and what she is used to.  When it is pointed out to her that she can come in for pain in her neck caused by this bed because she would have had this bed at home she becomes irritated and says that she had pain in her back.  When she was reminded that she complained of pain in her stomach she said yes she had pain in her stomach 2.  She reports she has been constipated but she is not sure how long.  She does remember having a bowel movement the day of presentation.  She reports she was having abdominal pain for years because she has to pee.  She reports the only reason that her abdominal pain got any better because she had a bowel movement which is due to what she ate.  But then she reports her abdominal pain is no better.  Clearly is convoluted, and it is difficult to decipher what is accurate.  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 (per DO)   Clinical Impression   Pt agreeable to OT and PT co-evaluation. Pt has reportedly been needing more assist for mobility since Friday. Pt is weaker than her baseline per  son's report. Pt required min to mod A for transfers today. Slow labored movement with difficulty managing RW without assist. Pt is generally weak and required assist for lower body dressing to don R AFL prior to transfers. Pt was left in the chair with family present. Pt will benefit from continued OT in the hospital and recommended venue below to increase strength, balance, and endurance for safe ADL's.        Recommendations for follow up therapy are one component of a multi-disciplinary discharge planning process, led by the attending physician.  Recommendations may be updated based on patient status, additional functional criteria and insurance authorization.   Assistance Recommended at Discharge Frequent or constant Supervision/Assistance  Patient can return home with the following A little help with walking and/or transfers;A lot of help with bathing/dressing/bathroom;Assistance with cooking/housework;Assist for transportation;Help with stairs or ramp for entrance    Functional Status Assessment  Patient has had a recent decline in their functional status and demonstrates the ability to make significant improvements in function in a reasonable and predictable amount of time.  Equipment Recommendations  None recommended by OT           Precautions / Restrictions Precautions Precautions: Fall Required Braces or Orthoses: Other Brace Other Brace: R AFO when standing and transfering. Restrictions Weight Bearing Restrictions: No  Mobility Bed Mobility Overal bed mobility: Needs Assistance Bed Mobility: Supine to Sit     Supine to sit: Min assist, Mod assist     General bed mobility comments: Slow labored movement; assist to pull to EOB and scoot to EOB.    Transfers Overall transfer level: Needs assistance Equipment used: Rolling walker (2 wheels) Transfers: Sit to/from Stand, Bed to chair/wheelchair/BSC Sit to Stand: Min assist     Step pivot transfers: Min  assist, Mod assist     General transfer comment: Unsteady in standing at times. Min A to stabilize RW for sit to stand from chair. Assist to lower to toilet. Generally unsteady with labored effort.      Balance Overall balance assessment: Needs assistance Sitting-balance support: No upper extremity supported, Feet supported Sitting balance-Leahy Scale: Fair Sitting balance - Comments: fair to good seated at EOB   Standing balance support: Bilateral upper extremity supported, During functional activity, Reliant on assistive device for balance Standing balance-Leahy Scale: Fair Standing balance comment: poor to fair using RW                           ADL either performed or assessed with clinical judgement   ADL Overall ADL's : Needs assistance/impaired Eating/Feeding: Modified independent;Sitting   Grooming: Set up;Sitting   Upper Body Bathing: Set up;Sitting   Lower Body Bathing: Moderate assistance;Maximal assistance;Sitting/lateral leans   Upper Body Dressing : Set up;Sitting   Lower Body Dressing: Moderate assistance;Maximal assistance;Sitting/lateral leans Lower Body Dressing Details (indicate cue type and reason): Assisted to don socks and R AFO seated at EOB. Toilet Transfer: Minimal assistance;Moderate assistance;Rolling walker (2 wheels);Ambulation Toilet Transfer Details (indicate cue type and reason): Chair to toilet and back with RW. Toileting- Clothing Manipulation and Hygiene: Maximal assistance;Sit to/from stand Toileting - Clothing Manipulation Details (indicate cue type and reason): Assited for peri-care while standing with PT.     Functional mobility during ADLs: Minimal assistance;Moderate assistance;Rolling walker (2 wheels) General ADL Comments: Slow labored movement. Able to ambulate to bathroom and back to chair with assist.     Vision Baseline Vision/History: 1 Wears glasses Ability to See in Adequate Light: 0 Adequate Patient Visual  Report: No change from baseline Vision Assessment?: No apparent visual deficits                Pertinent Vitals/Pain Pain Assessment Pain Assessment: Faces Faces Pain Scale: Hurts little more Pain Location: "vagina" Pain Descriptors / Indicators: Grimacing, Discomfort Pain Intervention(s): Limited activity within patient's tolerance, Monitored during session, Repositioned     Hand Dominance Left   Extremity/Trunk Assessment Upper Extremity Assessment Upper Extremity Assessment: Generalized weakness (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.)   Lower Extremity Assessment Lower Extremity Assessment: Defer to PT evaluation   Cervical / Trunk Assessment Cervical / Trunk Assessment: Kyphotic   Communication Communication Communication: No difficulties   Cognition Arousal/Alertness: Awake/alert Behavior During Therapy: WFL for tasks assessed/performed Overall Cognitive Status: Within Functional Limits for tasks assessed                                                        Home Living Family/patient expects to be discharged to:: Private residence Living Arrangements: Alone Available Help at Discharge: Family;Personal care attendant;Available PRN/intermittently Type of Home: House Home Access: Ramped  entrance     Home Layout: two level; able to live on main level     Bathroom Shower/Tub: Producer, television/film/video: Handicapped height (toilet riser) Bathroom Accessibility: Yes How Accessible: Accessible via wheelchair;Accessible via walker Home Equipment: Rollator (4 wheels);Rolling Walker (2 wheels);Shower seat;Grab bars - toilet;Wheelchair - manual   Additional Comments: Planned to be evaluated for an electric chair. Pt has care attendant 4 days a week.      Prior Functioning/Environment Prior Level of Function : Needs assist       Physical Assist : Mobility (physical);ADLs (physical)   ADLs (physical): IADLs;Bathing Mobility Comments:  w/c mobility in the house primarily; able to use RW for transfers and some ambulation within the home. ADLs Comments: Assisted for bathing and shower transfer. Assisted for IADL's.        OT Problem List: Decreased strength;Impaired balance (sitting and/or standing)      OT Treatment/Interventions: Self-care/ADL training;Therapeutic exercise;Therapeutic activities;Patient/family education;Balance training    OT Goals(Current goals can be found in the care plan section) Acute Rehab OT Goals Patient Stated Goal: Get stronger. OT Goal Formulation: With patient Time For Goal Achievement: 12/07/22 Potential to Achieve Goals: Good  OT Frequency: Min 2X/week    Co-evaluation PT/OT/SLP Co-Evaluation/Treatment: Yes Reason for Co-Treatment: To address functional/ADL transfers   OT goals addressed during session: ADL's and self-care      AM-PAC OT "6 Clicks" Daily Activity     Outcome Measure Help from another person eating meals?: None Help from another person taking care of personal grooming?: A Little Help from another person toileting, which includes using toliet, bedpan, or urinal?: A Lot Help from another person bathing (including washing, rinsing, drying)?: A Lot Help from another person to put on and taking off regular upper body clothing?: A Little Help from another person to put on and taking off regular lower body clothing?: A Lot 6 Click Score: 16   End of Session Equipment Utilized During Treatment: Rolling walker (2 wheels)  Activity Tolerance: Patient tolerated treatment well Patient left: in chair;with call bell/phone within reach;with family/visitor present  OT Visit Diagnosis: Unsteadiness on feet (R26.81);Other abnormalities of gait and mobility (R26.89);Muscle weakness (generalized) (M62.81);History of falling (Z91.81);Adult, failure to thrive (R62.7)                Time: 1610-9604 OT Time Calculation (min): 41 min Charges:  OT General Charges $OT Visit: 1  Visit OT Evaluation $OT Eval Low Complexity: 1 Low  Aaronmichael Brumbaugh OT, MOT   Danie Chandler 11/23/2022, 9:35 AM

## 2022-11-23 NOTE — Assessment & Plan Note (Addendum)
-   Reported that patient had near syncopal episode - Patient is a poor historian and not able to tell me anything about it - Monitor on telemetry

## 2022-11-23 NOTE — Assessment & Plan Note (Signed)
-   Continue lisinopril and verapamil

## 2022-11-23 NOTE — Assessment & Plan Note (Signed)
-   UA indicative of UTI - Patient reports a constant feeling of having to void - Rocephin started in the ED - Continue Rocephin - Urine culture pending - Previous urine culture grew Enterobacter resistant to ampicillin and Macrobid - Continue to monitor

## 2022-11-23 NOTE — Progress Notes (Signed)
PROGRESS NOTE  Alison Cuevas ZOX:[id number] DOB: [birth year] DOA: 11/22/2022 PCP: Jeoffrey Massed, MD  Brief History:  77 year old female with a history of hypertension, hyperlipidemia, stroke with chronic right-sided weakness, left bundle branch block presenting with generalized weakness, decreased oral intake, and lower abdominal pain.  The patient is a poor historian secondary to her encephalopathy.  History is obtained from review the medical record and speaking with the patient's son.  The patient's son relates that she has had a functional and constant decline over the past month with decreasing oral intake.  The patient has needed increasing help to even get out of bed.  At baseline, the patient was able to ambulate with a walker.  The patient initially complained of abdominal pain, but could not further elaborate any alleviating or aggravating factors or duration.  She denies any vomiting, diarrhea, chest pain, shortness breath, coughing, headache. Notably, the patient had 2 recent ED visits within the past week prior to this admission.  She had an ED visit on 11/17/2022 for a fall.  CT of the brain and x-ray of the lumbar spine were negative.  She was discharged home in stable condition.  She had a repeat ED visit on 11/20/2022 secondary to a syncopal episode.  This was felt to be vagal.  CT of the brain was negative.  She was discharged home in stable condition. In the ED, the patient had low-grade temperature of 99.0 F.  She was hemodynamically stable.  Oxygen saturation was 95% on room air.  WBC 6.9, hemoglobin 11.9, platelets 1-69,000.  Sodium 126, potassium 4.0, bicarbonate 23, serum creatinine 0.58.  CT of the chest abdomen pelvis showed slight compression fracture of T6, age-indeterminate.  There was moderate stool burden.  There is biapical and bibasilar scarring.  Pelvic x-ray was negative for fracture.  The patient was admitted for further evaluation and treatment of her altered  mental status and increasing generalized weakness.   Assessment/Plan: Acute metabolic encephalopathy -Secondary to dehydration, hyponatremia, and UTI -Continue IV fluids -Continue empiric ceftriaxone -Patient still remains confused -11/20/2022 CT brain negative  Failure to thrive -B12 -Folate -TSH -PT evaluation -Dietitian eval  Frequent falls -PT evaluation  UTI -Continue empiric ceftriaxone pending culture data  Hyponatremia -Due to poor solute intake and volume depletion -There is a chronic component dating back to March 2022  Essential hypertension -Restart verapamil and lisinopril  Anxiety -continue home dose alprazolam -PDMP reviewed, alprazolam 0.5 mg, #90 last refill 09/20/2022          Family Communication:  son updated 5/29  Consultants:  none  Code Status:  FULL   DVT Prophylaxis:  [place] Heparin  Procedures: As Listed in Progress Note Above  Antibiotics: Ceftriaxone 5/28>>     Subjective: Patient denies fevers, chills, headache, chest pain, dyspnea, nausea, vomiting, diarrhea, abdominal pain, dysuria, hematuria, hematochezia, and melena.   Objective: Vitals:   11/22/22 2230 11/22/22 2246 11/22/22 2323 11/23/22 0558  BP:  (!) 169/88 (!) 163/78 131/70  Pulse: 92 91 (!) 102 (!) 104  Resp: 14 16 20 18   Temp:  98 F (36.7 C) 98.6 F (37 C) 99 F (37.2 C)  TempSrc:  Oral Oral Oral  SpO2: 96% 98% 95% 92%  Weight:   49.8 kg   Height:        Intake/Output Summary (Last 24 hours) at 11/23/2022 0728 Last data filed at 11/23/2022 0500 Gross per 24 hour  Intake 938.07 ml  Output 1000 ml  Net -61.93 ml   Weight change:  Exam:  General:  Pt is alert, follows commands appropriately, not in acute distress HEENT: No icterus, No thrush, No neck mass, [place]/AT Cardiovascular: RRR, S1/S2, no rubs, no gallops Respiratory: CTA bilaterally, no wheezing, no crackles, no rhonchi Abdomen: Soft/+BS, non tender, non distended, no guarding Extremities:  No edema, No lymphangitis, No petechiae, No rashes, no synovitis Neuro:  CN II-XII intact, strength 4-/5 in RUE, RLE, strength 4/-5 LUE, LLE; sensation intact bilateral; no dysmetria; babinski equivocal    Data Reviewed: I have personally reviewed following labs and imaging studies Basic Metabolic Panel: Recent Labs  Lab 11/20/22 0939 11/22/22 1914 11/23/22 0426  NA 127* 126* 130*  K 3.9 4.0 3.6  CL 94* 93* 96*  CO2 21* 23 21*  GLUCOSE 125* 101* 94  BUN 11 11 9   CREATININE 0.71 0.58 0.62  CALCIUM 9.1 9.0 8.5*  MG  --   --  1.8   Liver Function Tests: Recent Labs  Lab 11/20/22 0939 11/23/22 0426  AST 24 39  ALT 21 32  ALKPHOS 54 67  BILITOT 0.9 1.2  PROT 6.7 6.1*  ALBUMIN 3.8 3.4*   No results for input(s): "LIPASE", "AMYLASE" in the last 168 hours. No results for input(s): "AMMONIA" in the last 168 hours. Coagulation Profile: No results for input(s): "INR", "PROTIME" in the last 168 hours. CBC: Recent Labs  Lab 11/20/22 0939 11/22/22 1914 11/23/22 0426  WBC 6.4 6.9 8.1  NEUTROABS 5.2 4.7 6.2  HGB 12.8 11.9* 11.8*  HCT 35.8* 33.0* 33.2*  MCV 89.3 88.0 88.5  PLT 157 169 167   Cardiac Enzymes: No results for input(s): "CKTOTAL", "CKMB", "CKMBINDEX", "TROPONINI" in the last 168 hours. BNP: Invalid input(s): "POCBNP" CBG: Recent Labs  Lab 11/20/22 0917  GLUCAP 137*   HbA1C: No results for input(s): "HGBA1C" in the last 72 hours. Urine analysis:    Component Value Date/Time   COLORURINE YELLOW 11/22/2022 1950   APPEARANCEUR CLEAR 11/22/2022 1950   LABSPEC 1.006 11/22/2022 1950   PHURINE 7.0 11/22/2022 1950   GLUCOSEU NEGATIVE 11/22/2022 1950   GLUCOSEU NEGATIVE 12/09/2013 1440   HGBUR SMALL (A) 11/22/2022 1950   BILIRUBINUR NEGATIVE 11/22/2022 1950   BILIRUBINUR negative 03/19/2021 1750   BILIRUBINUR Negative 05/26/2020 1320   KETONESUR NEGATIVE 11/22/2022 1950   PROTEINUR NEGATIVE 11/22/2022 1950   UROBILINOGEN 0.2 03/19/2021 1750    UROBILINOGEN 0.2 07/30/2014 1528   NITRITE POSITIVE (A) 11/22/2022 1950   LEUKOCYTESUR LARGE (A) 11/22/2022 1950   Sepsis Labs: @LABRCNTIP (procalcitonin:4,lacticidven:4) )No results found for this or any previous visit (from the past 240 hour(s)).   Scheduled Meds:  ALPRAZolam  0.5 mg Oral QHS   aspirin EC  81 mg Oral Daily   heparin  5,000 Units Subcutaneous Q8H   lisinopril  10 mg Oral Daily   pantoprazole  40 mg Oral QHS   polyethylene glycol  17 g Oral BID   tiZANidine  2 mg Oral TID   verapamil  180 mg Oral Daily   Continuous Infusions:  sodium chloride 75 mL/hr at 11/23/22 0139   cefTRIAXone (ROCEPHIN)  IV      Procedures/Studies: CT L-SPINE NO CHARGE  Result Date: 11/22/2022 CLINICAL DATA:  Low back pain.  Near syncope. EXAM: CT LUMBAR SPINE WITHOUT CONTRAST TECHNIQUE: Multidetector CT imaging of the lumbar spine was performed without intravenous contrast administration. Multiplanar CT image reconstructions were also generated. RADIATION DOSE REDUCTION: This exam was performed according to the  departmental dose-optimization program which includes automated exposure control, adjustment of the mA and/or kV according to patient size and/or use of iterative reconstruction technique. COMPARISON:  CT chest abdomen and pelvis today. FINDINGS: Segmentation: 5 lumbar type vertebrae. Alignment: No subluxation.  Convex leftward scoliosis. Vertebrae: No acute fracture or focal pathologic process. Paraspinal and other soft tissues: Negative Disc levels: Advanced diffuse degenerative disc disease and facet disease. Multifactorial neural foraminal narrowing at L2-3 and L3-4 related to posterior spurring, facet disease and ligamentum hypertrophy. No visible disc herniation. IMPRESSION: Convex leftward scoliosis with associated advanced degenerative disc and facet disease. No acute bony abnormality. Electronically Signed   By: Charlett Nose M.D.   On: 11/22/2022 20:28   CT CHEST ABDOMEN PELVIS W  CONTRAST  Result Date: 11/22/2022 CLINICAL DATA:  Polytrauma, blunt EXAM: CT CHEST, ABDOMEN, AND PELVIS WITH CONTRAST TECHNIQUE: Multidetector CT imaging of the chest, abdomen and pelvis was performed following the standard protocol during bolus administration of intravenous contrast. RADIATION DOSE REDUCTION: This exam was performed according to the departmental dose-optimization program which includes automated exposure control, adjustment of the mA and/or kV according to patient size and/or use of iterative reconstruction technique. CONTRAST:  OMNIPAQUE IOHEXOL 300 MG/ML  SOLN COMPARISON:  06/26/2020 FINDINGS: CT CHEST FINDINGS Cardiovascular: Heart is normal size. Aorta normal caliber. Scattered coronary artery and aortic calcifications. Mediastinum/Nodes: No mediastinal, hilar, or axillary adenopathy. Trachea and esophagus are unremarkable. Thyroid unremarkable. Lungs/Pleura: Biapical scarring. Linear scarring or atelectasis in the lung bases. No confluent opacities, effusions or pneumothorax. Musculoskeletal: Chest wall soft tissues are unremarkable. Slight compression through the superior endplate of T6 compatible with age-indeterminate compression fracture. CT ABDOMEN PELVIS FINDINGS Hepatobiliary: No hepatic injury or perihepatic hematoma. Prior cholecystectomy. Mild intrahepatic and extrahepatic biliary ductal dilatation likely related to patient's age and post cholecystectomy state. 1.8 cm cyst in the inferior right hepatic lobe. Pancreas: No focal abnormality or ductal dilatation. Spleen: No splenic injury or perisplenic hematoma. Adrenals/Urinary Tract: No adrenal hemorrhage or renal injury identified. Bladder is unremarkable. No renal or adrenal mass. No hydronephrosis. Stomach/Bowel: 3 cm duodenal diverticulum. Moderate stool burden throughout the colon. Sigmoid diverticulosis. No active diverticulitis. No bowel obstruction. Vascular/Lymphatic: Aortic atherosclerosis. No evidence of aneurysm  or adenopathy. Reproductive: Prior hysterectomy.  No adnexal masses. Other: No free fluid or free air. Musculoskeletal: Convex leftward scoliosis in the lumbar spine. Degenerative disc and facet disease diffusely. No acute bony abnormality. IMPRESSION: Slight compression fracture through the superior endplate of T6, age indeterminate. Coronary artery disease, aortic atherosclerosis. No acute cardiopulmonary disease. No acute findings in the abdomen or pelvis. Sigmoid diverticulosis.  Moderate stool burden. Electronically Signed   By: Charlett Nose M.D.   On: 11/22/2022 20:25   DG Hip Unilat With Pelvis 2-3 Views Left  Result Date: 11/22/2022 CLINICAL DATA:  Fall, left hip pain EXAM: DG HIP (WITH OR WITHOUT PELVIS) 2-3V LEFT COMPARISON:  Lumbar spine study 11/17/2022 FINDINGS: Convex leftward scoliosis in the lumbar spine. Degenerative changes in the visualized lumbar spine. Hip joints are symmetric. No acute bony abnormality. Specifically, no fracture, subluxation, or dislocation. IMPRESSION: No acute bony abnormality. Electronically Signed   By: Charlett Nose M.D.   On: 11/22/2022 19:22   DG Chest Port 1 View  Result Date: 11/20/2022 CLINICAL DATA:  Syncope with fall. EXAM: PORTABLE CHEST 1 VIEW COMPARISON:  11/02/2021 FINDINGS: The lungs are clear without focal pneumonia, edema, pneumothorax or pleural effusion. The cardiopericardial silhouette is within normal limits for size. Bones are diffusely demineralized. Telemetry  leads overlie the chest. IMPRESSION: No active disease. Electronically Signed   By: Kennith Center M.D.   On: 11/20/2022 10:21   CT Head Wo Contrast  Result Date: 11/20/2022 CLINICAL DATA:  Syncope.  Dizziness.  Status post fall. EXAM: CT HEAD WITHOUT CONTRAST TECHNIQUE: Contiguous axial images were obtained from the base of the skull through the vertex without intravenous contrast. RADIATION DOSE REDUCTION: This exam was performed according to the departmental dose-optimization program  which includes automated exposure control, adjustment of the mA and/or kV according to patient size and/or use of iterative reconstruction technique. COMPARISON:  11/17/2022 FINDINGS: Brain: There is no evidence for acute hemorrhage, hydrocephalus, mass lesion, or abnormal extra-axial fluid collection. No definite CT evidence for acute infarction. Patchy low attenuation in the deep hemispheric and periventricular white matter is nonspecific, but likely reflects chronic microvascular ischemic demyelination. Stable appearance right parietal encephalomalacia towards the vertex consistent with remote infarct. Vascular: No hyperdense vessel or unexpected calcification. Skull: No evidence for fracture. No worrisome lytic or sclerotic lesion. Sinuses/Orbits: Chronic opacification left sphenoid sinus, similar to prior. Remaining visualized paranasal sinuses and left mastoid air cells are clear. Surgical changes noted right mastoid air cells with posterior right mastoid effusion, stable. Other: None. IMPRESSION: 1. No acute intracranial abnormality. 2. Stable appearance of right parietal encephalomalacia towards the vertex consistent with remote infarct. 3. Chronic left sphenoid sinusitis. Electronically Signed   By: Kennith Center M.D.   On: 11/20/2022 10:18   DG Lumbar Spine Complete  Result Date: 11/17/2022 CLINICAL DATA:  Fall. EXAM: LUMBAR SPINE - COMPLETE 4+ VIEW COMPARISON:  CT abdomen pelvis dated June 26, 2020. FINDINGS: Five lumbar type vertebral bodies. No acute fracture or subluxation. Vertebral body heights are preserved. Unchanged prominent levoscoliosis. Unchanged mild retrolisthesis at L1-L2 and L2-L3 and mild anterolisthesis at L3-L4. Unchanged severe disc height loss and facet arthropathy throughout the lumbar spine. IMPRESSION: 1. No acute osseous abnormality. 2. Unchanged severe multilevel lumbar spondylosis. Electronically Signed   By: Obie Dredge M.D.   On: 11/17/2022 17:31   CT Cervical  Spine Wo Contrast  Result Date: 11/17/2022 CLINICAL DATA:  Tripped and fell, back pain EXAM: CT CERVICAL SPINE WITHOUT CONTRAST TECHNIQUE: Multidetector CT imaging of the cervical spine was performed without intravenous contrast. Multiplanar CT image reconstructions were also generated. RADIATION DOSE REDUCTION: This exam was performed according to the departmental dose-optimization program which includes automated exposure control, adjustment of the mA and/or kV according to patient size and/or use of iterative reconstruction technique. COMPARISON:  05/16/2021 FINDINGS: Alignment: Alignment is grossly anatomic. Skull base and vertebrae: No acute fracture. No primary bone lesion or focal pathologic process. Soft tissues and spinal canal: No prevertebral fluid or swelling. No visible canal hematoma. Disc levels: Prior C3-C6 ACDF, with no evidence of orthopedic hardware failure or loosening. Slight progression of the spondylosis seen previously at C2-3 and C6-7. Upper chest: Airway is patent.  Lung apices are clear. Other: Reconstructed images demonstrate no additional findings. IMPRESSION: 1. No acute cervical spine fracture. 2. Stable C3-C6 ACDF. 3. Slight progression of spondylosis at C2-3 and C6-7. Electronically Signed   By: Sharlet Salina M.D.   On: 11/17/2022 16:48   CT Head Wo Contrast  Result Date: 11/17/2022 CLINICAL DATA:  Tripped and fell, lower back pain EXAM: CT HEAD WITHOUT CONTRAST TECHNIQUE: Contiguous axial images were obtained from the base of the skull through the vertex without intravenous contrast. RADIATION DOSE REDUCTION: This exam was performed according to the departmental dose-optimization program which  includes automated exposure control, adjustment of the mA and/or kV according to patient size and/or use of iterative reconstruction technique. COMPARISON:  09/04/2021 FINDINGS: Brain: Stable encephalomalacia at the left frontal parietal vertex consistent with prior cortical infarct.  Chronic small vessel ischemic changes are seen throughout the periventricular white matter. No evidence of acute infarct or hemorrhage. Lateral ventricles and midline structures are stable. No acute extra-axial fluid collections. No mass effect. Vascular: No hyperdense vessel or unexpected calcification. Skull: Normal. Negative for fracture or focal lesion. Sinuses/Orbits: Postsurgical changes from prior right mastoidectomy. Small right mastoid effusion is noted. 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 Other: None. IMPRESSION: 1. No acute intracranial process. 2. Stable chronic ischemic changes as above. Electronically Signed   By: Sharlet Salina M.D.   On: 11/17/2022 16:46    Catarina Hartshorn, DO  [facility]  If 7PM-7AM, please contact night-coverage [email or web address] Password [email or web address] 11/23/2022, 7:28 AM   LOS: 0 days

## 2022-11-23 NOTE — H&P (Signed)
History and Physical    Patient: Alison Cuevas ZOX:[id number] DOB: 08/25/1945 DOA: 11/22/2022 DOS: the patient was seen and examined on 11/23/2022 PCP: Jeoffrey Massed, MD  Patient coming from: Home  Chief Complaint:  Chief Complaint  Patient presents with   Near Syncope   HPI: Alison Cuevas is a 77 y.o. female with medical history significant of GERD, degenerative disc disease, hyperlipidemia, hypertension, syncope, left bundle branch block, and more presents to the ED with a chief complaint of pain.  Chart review reveals that patient came in for near syncopal episode x 2.  Once was on the toilet.  Her last bowel movement was the a.m. of the presentation, but previous to that had been 3 days ago.  Patient had been complaining about lower abdominal pain.  Son told staff that they can no longer take care of patient at home and need her to be placed.  For me, patient is a poor historian and not able to give much to go on.  She reports that she came in for pain.  She reports pain in her neck because the bed is and what she is used to.  When it is pointed out to her that she can come in for pain in her neck caused by this bed because she would have had this bed at home she becomes irritated and says that she had pain in her back.  When she was reminded that she complained of pain in her stomach she said yes she had pain in her stomach 2.  She reports she has been constipated but she is not sure how long.  She does remember having a bowel movement the day of presentation.  She reports she was having abdominal pain for years because she has to pee.  She reports the only reason that her abdominal pain got any better because she had a bowel movement which is due to what she ate.  But then she reports her abdominal pain is no better.  Clearly is convoluted, and it is difficult to decipher what is accurate.  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  Patient does not smoke, does not drink.  She  is vaccinated for COVID.  Patient reports she would want to be full code. Review of Systems: unable to review all systems due to the inability of the patient to answer questions. Past Medical History:  Diagnosis Date   Allergic rhinitis    maple pollen   Anxiety    Arthritis    Bowel obstruction (HCC)    Chronic low back pain    Dr. Dutch Quint.  Has had back injection--bp elevated after.   Chronic pain syndrome    Chronic renal insufficiency, stage 2 (mild)    GFR 60-70   DDD (degenerative disc disease), cervical    Hx of ACDF (Dr. Jordan Likes).  Followed by Dr. Charlett Blake.  Also, Dr. Vickki Hearing do left C7-T1 intralaminal epidural injection.   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  Resid R arm and leg weakness + flexion contractures.   Hypercholesteremia    mild; pt declined statin trial 05/2014--needs recheck lipid panel at first f/u visit in 2017   Hypertension    LBBB (left bundle branch block)    Osteopenia  06/2017   T score -1.4 FRAX 15% / 2%   Ovarian cyst 2017   82019-robotic assisted bilatel SPO: all path benign.   Perianal dermatitis    prn cutivate   Phlebitis    Right hemiplegia (HCC) 01/2020   R arm and leg (hemorrhagic L cerebral hemisphere CVA)   Right knee injury 2018   Patellofemoral crush injury--Dr. Charlett Blake.   Solitary thyroid nodule 05/2021   needs bx->plan endo ref   Stroke [facility])    Syncope 10/2021   cardiac w/u as per Dr. Mayford Knife   Syncope    2023 (vasovagal suspected)   Trochanteric bursitis of right hip    Recurrent (injection by Dr. Charlett Blake 05/26/15)   Tympanic membrane rupture, right 12/01/2016   As of 08/2018 pt set for tympanomastoidectomy and STSG (Dr. Ignacia Felling). Recurrent fungal/bact OE.   Varicose vein    left leg     Past Surgical History:  Procedure Laterality Date   ABDOMINAL HYSTERECTOMY  03/2015   TAH.  Last pap 2016.  No hx of abnl paps.  Per GYN, no further pap smears indicated.   ANTERIOR AND POSTERIOR REPAIR N/A 03/18/2014   Procedure: Cystocele repair with graft, Vault suspension, Rectocele repair;  Surgeon: Martina Sinner, MD;  Location: [facility];  Service: Urology;  Laterality: N/A;   BIOPSY THYROID  06/2021   benign   CHOLECYSTECTOMY     CHOLECYSTECTOMY, LAPAROSCOPIC     COLON SURGERY     COLONOSCOPY  04/2006; 05/26/16   2007 (Dr. Jarold Motto): Normal.  04/2016 (Dr. Leone Payor) normal except diverticulosis and decreased anal sphincter tone.  No repeat colonoscopy is recommended due to age.   cspine surgery     Dr. Estelle Grumbles level ant cerv discectomy Lavenia Atlas w/plating   CYSTO N/A 03/18/2014   Procedure: CYSTO;  Surgeon: Martina Sinner, MD;  Location: [facility];  Service: Urology;  Laterality: N/A;   DEXA  07/30/2015; 06/2018   2017 and 2020 -->Osteopenia--repeat 2 yrs. T score -2.29 Mar 2021. Rpt 2 yrs.   LYSIS OF ADHESION N/A 01/31/2018   Procedure: POSSIBLE LYSIS OF ADHESION;  Surgeon: Adolphus Birchwood, MD;  Location: [facility];  Service: Gynecology;  Laterality: N/A;   RECTOCELE REPAIR     with prolasped bledder repair   RESECTION OF COLON     BENIGN TUMOR   RIGHT EAR SURGERY  08/23/2017   Dr. 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  Procedure: XI ROBOTIC ASSISTED BILATERAL SALPINGO OOPHORECTOMY;  Surgeon:  Adolphus Birchwood, MD;  Location: [facility];  Service: Gynecology;  Laterality: Bilateral;   TRANSTHORACIC ECHOCARDIOGRAM  01/27/2020   2021 EF 65-70%, NORMAL. 12/01/21 grd I DD, o/w normal.   TUBAL LIGATION     Social History:  reports that she has never smoked. She has never used smokeless tobacco. She reports that she does not drink alcohol and does not use drugs.  Allergies  Allergen Reactions   Gabapentin Anxiety    Elevated heart rate and crazy dreams   Prednisone Anxiety and Other (See Comments)    REACTION: funny  feeling    Family History  Problem Relation Age of Onset   Thyroid disease Mother    Hypertension Mother    Diverticulitis Mother    Stomach cancer Father    Heart attack Father    Heart failure Father    Breast cancer Sister 63   Lupus Sister    Melanoma Brother    Prostate cancer Brother    Leukemia Brother    Lung disease Brother    Other Other        Family member with MGUS    Prior to Admission medications   Medication Sig Start Date End Date Taking? Authorizing Provider  acetaminophen (TYLENOL) 500 MG tablet Take 1 tablet (500 mg total) by mouth 3 (three) times daily. 02/19/20  Yes Love, Evlyn Kanner, PA-C  ALPRAZolam (XANAX) 0.5 MG tablet TAKE 1 TABLET BY MOUTH THREE TIMES A DAY AS NEEDED FOR ANXIETY Patient taking differently: Take 0.5 mg by mouth at bedtime. 08/31/22  Yes McGowen, Maryjean Morn, MD  aspirin EC 81 MG tablet Take 1 tablet (81 mg total) by mouth daily. Swallow whole. 04/20/20  Yes McCue, Shanda Bumps, NP  Biotin 5 MG TABS Take 5 mg by mouth daily.   Yes [provider]  clobetasol (TEMOVATE) 0.05 % external solution Apply topically. 01/13/22  Yes [provider]  fluticasone (FLONASE) 50 MCG/ACT nasal spray Place 2 sprays into both nostrils at bedtime. Patient taking differently: Place 1 spray into both nostrils at bedtime. 05/17/16  Yes McGowen, Maryjean Morn, MD  HYDROcodone-acetaminophen (NORCO/VICODIN) 5-325 MG tablet Take 1 tablet by mouth every  6 (six) hours as needed for moderate pain. 01/19/22  Yes McGowen, Maryjean Morn, MD  KLOR-CON M20 20 MEQ tablet TAKE 1 TABLET BY MOUTH EVERY DAY 08/31/22  Yes McGowen, Maryjean Morn, MD  lisinopril (ZESTRIL) 10 MG tablet Take 1 tablet (10 mg total) by mouth daily. MUST KEEP APPT FOR FURTHER REFILLS 10/27/22  Yes McGowen, Maryjean Morn, MD  loratadine (CLARITIN) 10 MG tablet Take 10 mg by mouth every other day.    Yes [provider]  meclizine (ANTIVERT) 12.5 MG tablet Take 1 tablet (12.5 mg total) by mouth 3 (three) times daily. 1 tab po bid prn dizziness 10/24/22  Yes McGowen, Maryjean Morn, MD  Multiple Minerals-Vitamins (CALCIUM-MAGNESIUM-ZINC-D3 PO) Take 1 tablet by mouth daily.    Yes [provider]  pantoprazole (PROTONIX) 40 MG tablet TAKE 1 TABLET BY MOUTH EVERYDAY AT BEDTIME Patient taking differently: Take 40 mg by mouth at bedtime. 09/07/22  Yes McGowen, Maryjean Morn, MD  polyethylene glycol (MIRALAX / GLYCOLAX) 17 g packet Take 17 g by mouth 3 (three) times daily.   Yes [provider]  tiZANidine (ZANAFLEX) 2 MG tablet Take 1 tablet (2 mg total) by mouth 3 (three) times daily. 10/26/22  Yes Ranelle Oyster, MD  verapamil (CALAN-SR) 180 MG CR tablet Take 1 tablet (180 mg total) by mouth daily. 02/17/22  Yes McGowen, Maryjean Morn, MD  magic mouthwash (nystatin, lidocaine, diphenhydrAMINE, alum & mag hydroxide) suspension Swish and spit 10 mLs 4 (four) times daily. Patient not taking: Reported on 11/22/2022 09/16/22   Jeoffrey Massed, MD  Sodium Fluoride (CLINPRO 5000) 1.1 % PSTE Take 1 Application by mouth at bedtime.    [provider]    Physical Exam: Vitals:   11/22/22 2200 11/22/22 2230 11/22/22 2246 11/22/22 2323  BP:   (!) 169/88 (!) 163/78  Pulse: 96 92 91 (!) 102  Resp: 18 14 16  20  Temp:   98 F (36.7 C) 98.6 F (37 C)  TempSrc:   Oral Oral  SpO2: 95% 96% 98% 95%  Weight:    49.8 kg  Height:       1.  General: Patient lying supine in bed, chronically  ill-appearing   2. Psychiatric: Alert and oriented x 3, mood is irritable and behavior is normal for situation, pleasant and cooperative with exam   3. Neurologic: Speech and language are normal, face is symmetric, moves all 4 extremities voluntarily, at baseline without acute deficits on limited exam   4. HEENMT:  Head is atraumatic, normocephalic, pupils reactive to light, neck is supple, trachea is midline, mucous membranes are moist   5. Respiratory : Lungs are clear to auscultation bilaterally without wheezing, rhonchi, rales, no cyanosis, no increase in work of breathing or accessory muscle use   6. Cardiovascular : Heart rate normal, rhythm is regular, no murmurs, rubs or gallops, no peripheral edema, peripheral pulses palpated   7. Gastrointestinal:  Abdomen is soft, nondistended, diffuse mild tenderness over abdomen, bowel sounds active, no masses or organomegaly palpated   8. Skin:  Skin is warm, dry and intact without rashes, acute lesions, or ulcers on limited exam   9.Musculoskeletal:  No acute deformities or trauma, no asymmetry in tone, no peripheral edema, peripheral pulses palpated, no tenderness to palpation in the extremities  Data Reviewed: In the ED Temp 98.1, heart rate 87-95, respiratory rate 15-16, blood pressure 152/71-170/91, satting 92-96% No leukocytosis with a white blood cell count of 6.9, hemoglobin 11.9, platelets 169 UA is indicative of UTI Imaging shows no acute bony abnormality EKG shows a heart rate of 89, sinus tach, QTc 484 Patient does have a history of stroke with right-sided weakness.  She had a fall 5 days ago and seems to have never bounced back to her baseline She had near syncopal episode today Family is interested in having the patient placed  Assessment and Plan: * Near syncope - Reported that patient had near syncopal episode - Patient is a poor historian and not able to tell me anything about it - Monitor on telemetry  UTI  (urinary tract infection) - UA indicative of UTI - Patient reports a constant feeling of having to void - Rocephin started in the ED - Continue Rocephin - Urine culture pending - Previous urine culture grew Enterobacter resistant to ampicillin and Macrobid - Continue to monitor  Hyponatremia - Sodium down to 126 - Continue IV NS - Trend in the a.m.  Hypertension - Continue lisinopril and verapamil  GERD - Continue PPI      Advance Care Planning:   Code Status: Full Code  Consults: TOC for placement  Family Communication: No family at bedside  Severity of Illness: The appropriate patient status for this patient is OBSERVATION. Observation status is judged to be reasonable and necessary in order to provide the required intensity of service to ensure the patient's safety. The patient's presenting symptoms, physical exam findings, and initial radiographic and laboratory data in the context of their medical condition is felt to place them at decreased risk for further clinical deterioration. Furthermore, it is anticipated that the patient will be medically stable for discharge from the hospital within 2 midnights of admission.   Author: Lilyan Gilford, DO 11/23/2022 5:07 AM  For on call review [email or web address].

## 2022-11-23 NOTE — Hospital Course (Signed)
77 year old female with a history of hypertension, hyperlipidemia, stroke with chronic right-sided weakness, left bundle branch block presenting with generalized weakness, decreased oral intake, and lower abdominal pain.  The patient is a poor historian secondary to her encephalopathy.  History is obtained from review the medical record and speaking with the patient's son.  The patient's son relates that she has had a functional and constant decline over the past month with decreasing oral intake.  The patient has needed increasing help to even get out of bed.  At baseline, the patient was able to ambulate with a walker.  The patient initially complained of abdominal pain, but could not further elaborate any alleviating or aggravating factors or duration.  She denies any vomiting, diarrhea, chest pain, shortness breath, coughing, headache. Notably, the patient had 2 recent ED visits within the past week prior to this admission.  She had an ED visit on 11/17/2022 for a fall.  CT of the brain and x-ray of the lumbar spine were negative.  She was discharged home in stable condition.  She had a repeat ED visit on 11/20/2022 secondary to a syncopal episode.  This was felt to be vagal.  CT of the brain was negative.  She was discharged home in stable condition. In the ED, the patient had low-grade temperature of 99.0 F.  She was hemodynamically stable.  Oxygen saturation was 95% on room air.  WBC 6.9, hemoglobin 11.9, platelets 1-69,000.  Sodium 126, potassium 4.0, bicarbonate 23, serum creatinine 0.58.  CT of the chest abdomen pelvis showed slight compression fracture of T6, age-indeterminate.  There was moderate stool burden.  There is biapical and bibasilar scarring.  Pelvic x-ray was negative for fracture.  The patient was admitted for further evaluation and treatment of her altered mental status and increasing generalized weakness.

## 2022-11-23 NOTE — TOC Initial Note (Signed)
Transition of Care [facility]) - Initial/Assessment Note    Patient Details  Name: Alison Cuevas MRN: [id number] Date of Birth: [birth year]  Transition of Care [facility]) CM/SW Contact:    Karn Cassis, LCSW Phone Number: 11/23/2022, 10:37 AM  Clinical Narrative: Pt admitted due to near syncope. Assessment completed with pt and pt's son, Alison Cuevas. Pt reports she lives alone, but sometimes has a friend stay with her. Her friend does Cuevas of the cooking and cleaning. Pt ambulates with a walker at baseline. Family/friend provide transportation to appointments. PT recommended SNF. LCSW discussed with son at pt's request. LCSW confirmed with [facility] that pt qualifies for SNF waiver. Son aware of observation status and [facility] waiver for SNF. Reviewed Medicare.gov ratings. Son requests [facility] or [facility] in [place] (both on waiver list). TOC will notify [facility] when bed chosen.                   Expected Discharge Plan: Skilled Nursing Facility Barriers to Discharge: Continued Medical Work up   Patient Goals and CMS Choice Patient states their goals for this hospitalization and ongoing recovery are:: SNF   Choice offered to / list presented to : Patient, Adult Children [place] ownership interest in [facility].provided to:: Adult Children    Expected Discharge Plan and Services In-house Referral: Clinical Social Work   Post Acute Care Choice: Skilled Nursing Facility Living arrangements for the past 2 months: Single Family Home                                      Prior Living Arrangements/Services Living arrangements for the past 2 months: Single Family Home Lives with:: Self Patient language and need for interpreter reviewed:: Yes        Need for Family Participation in Patient Care: Yes (Comment)   Current home services: DME (cane, walker, wheelchair) Criminal Activity/Legal Involvement Pertinent to Current Situation/Hospitalization: No - Comment as needed  Activities of  Daily Living Home Assistive Devices/Equipment: Wheelchair ADL Screening (condition at time of admission) Patient's cognitive ability adequate to safely complete daily activities?: No Is the patient deaf or have difficulty hearing?: No Does the patient have difficulty seeing, even when wearing glasses/contacts?: No Does the patient have difficulty concentrating, remembering, or making decisions?: Yes Patient able to express need for assistance with ADLs?: Yes Does the patient have difficulty dressing or bathing?: Yes Independently performs ADLs?: No Communication: Independent Dressing (OT): Needs assistance Is this a change from baseline?: Pre-admission baseline Grooming: Needs assistance Is this a change from baseline?: Pre-admission baseline Feeding: Independent Bathing: Needs assistance Is this a change from baseline?: Pre-admission baseline Toileting: Needs assistance Is this a change from baseline?: Pre-admission baseline In/Out Bed: Needs assistance Is this a change from baseline?: Change from baseline, expected to last >3 days Walks in Home: Needs assistance Is this a change from baseline?: Pre-admission baseline Does the patient have difficulty walking or climbing stairs?: Yes Weakness of Legs: Both Weakness of Arms/Hands: Right  Permission Sought/Granted                  Emotional Assessment     Affect (typically observed): Appropriate Orientation: : Oriented to Self, Oriented to Place, Oriented to  Time, Oriented to Situation Alcohol / Substance Use: Not Applicable Psych Involvement: No (comment)  Admission diagnosis:  Near syncope [R55] Patient Active Problem List   Diagnosis Date Noted   Near  syncope 11/22/2022   Thyroid nodule 07/08/2021   Thrombocytopenia, unspecified (HCC) 11/19/2020   Pain in joint of right foot 04/15/2020   Right spastic hemiparesis (HCC) 03/04/2020   Chronic otitis externa of right ear 02/20/2020   Intraparenchymal hemorrhage of  brain (HCC)    Delirium    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, hypertensive 01/26/2020   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   Conductive hearing loss of right ear with unrestricted hearing of left ear 05/28/2019   Hyponatremia 08/31/2017   Generalized weakness 08/31/2017   Dehydration 08/31/2017   UTI (urinary tract infection) 08/31/2017   Tympanic membrane rupture, right 12/01/2016   Right ovarian cyst 08/22/2016   Fibromyalgia 06/03/2015   Cystocele 03/18/2014   Tick-borne disease 12/25/2013   Diverticulosis of colon without hemorrhage 06/12/2013   Hypertension    Osteopenia    Chronic low back pain 05/12/2011   HYPERCHOLESTEROLEMIA 03/17/2008   Anxiety state 09/17/2007   GERD 09/17/2007   DJD (degenerative joint disease) 09/17/2007   PCP:  Jeoffrey Massed, MD Pharmacy:   [facility] [phone] - [place], [place] - [address] [place] [address] Phone: [phone] Fax: [phone]     Social Determinants of Health (SDOH) Social History: SDOH Screenings   Food Insecurity: No Food Insecurity (11/22/2022)  Housing: Low Risk  (11/22/2022)  Transportation Needs: No Transportation Needs (11/22/2022)  Utilities: Not At Risk (11/22/2022)  Alcohol Screen: Low Risk  (07/20/2022)  Depression (PHQ2-9): Low Risk  (07/20/2022)  Financial Resource Strain: Low Risk  (07/20/2022)  Physical Activity: Sufficiently Active (07/20/2022)  Social Connections: Moderately Isolated (07/20/2022)  Stress: No Stress Concern Present (07/20/2022)  Tobacco Use: Low Risk  (11/22/2022)   SDOH Interventions:     Readmission Risk Interventions     No data to display

## 2022-11-23 NOTE — Plan of Care (Signed)
  Problem: Acute Rehab PT Goals(only PT should resolve) Goal: Pt Will Go Supine/Side To Sit Outcome: Progressing Flowsheets (Taken 11/23/2022 1231) Pt will go Supine/Side to Sit: with min guard assist Goal: Patient Will Transfer Sit To/From Stand Outcome: Progressing Flowsheets (Taken 11/23/2022 1231) Patient will transfer sit to/from stand:  with minimal assist  with min guard assist Goal: Pt Will Transfer Bed To Chair/Chair To Bed Outcome: Progressing Flowsheets (Taken 11/23/2022 1231) Pt will Transfer Bed to Chair/Chair to Bed:  min guard assist  with min assist Goal: Pt Will Ambulate Outcome: Progressing Flowsheets (Taken 11/23/2022 1231) Pt will Ambulate:  25 feet  with min guard assist  with minimal assist  with rolling walker   12:32 PM, 11/23/22 Ocie Bob, MPT Physical Therapist with [facility] [phone] office [phone] mobile phone

## 2022-11-23 NOTE — Discharge Instructions (Signed)
Nutrition Post Hospital Stay Proper nutrition can help your body recover from illness and injury.   Foods and beverages high in protein, vitamins, and minerals help rebuild muscle loss, promote healing, & reduce fall risk.   In addition to eating healthy foods, a nutrition shake is an easy, delicious way to get the nutrition you need during and after your hospital stay  It is recommended that you continue to drink 2 bottles per day of:   high protein/hight calorie nutrition shake for at least 1 month (30 days) after your hospital stay   Tips for adding a nutrition shake into your routine: As allowed, drink one with vitamins or medications instead of water or juice Enjoy one as a tasty mid-morning or afternoon snack Drink cold or make a milkshake out of it Drink one instead of milk with cereal or snacks Use as a coffee creamer   Available at the following grocery stores and pharmacies:           * Karin Golden * [employer] * [employer]  * R[employer] Aid          * Walmart * [employer]  * [facility]      * Target  * BJ's   * [facility]  * [employer]   * [facility] [phone]            For COUPONS visit: www.ensure.com/join or RoleLink.com.br   Suggested Substitutions Ensure Plus = Boost Plus = Carnation Breakfast Essentials = Boost Compact Ensure Active Clear = Boost Breeze Glucerna Shake = Boost Glucose Control = Carnation Breakfast Essentials SUGAR FREE

## 2022-11-24 DIAGNOSIS — R55 Syncope and collapse: Secondary | ICD-10-CM | POA: Diagnosis not present

## 2022-11-24 DIAGNOSIS — E43 Unspecified severe protein-calorie malnutrition: Secondary | ICD-10-CM | POA: Diagnosis not present

## 2022-11-24 DIAGNOSIS — G9341 Metabolic encephalopathy: Secondary | ICD-10-CM | POA: Diagnosis not present

## 2022-11-24 DIAGNOSIS — N3000 Acute cystitis without hematuria: Secondary | ICD-10-CM | POA: Diagnosis not present

## 2022-11-24 LAB — BASIC METABOLIC PANEL
Anion gap: 6 (ref 5–15)
BUN: 14 mg/dL (ref 8–23)
CO2: 25 mmol/L (ref 22–32)
Calcium: 8 mg/dL — ABNORMAL LOW (ref 8.9–10.3)
Chloride: 97 mmol/L — ABNORMAL LOW (ref 98–111)
Creatinine, Ser: 0.71 mg/dL (ref 0.44–1.00)
GFR, Estimated: >60 mL/min (ref >60)
Glucose, Bld: 96 mg/dL (ref 70–99)
Potassium: 3.8 mmol/L (ref 3.5–5.1)
Sodium: 128 mmol/L — ABNORMAL LOW (ref 135–145)

## 2022-11-24 LAB — CULTURE, BLOOD (ROUTINE X 2): Special Requests: ADEQUATE

## 2022-11-24 LAB — CBC
HCT: 27.6 % — ABNORMAL LOW (ref 36.0–46.0)
Hemoglobin: 9.7 g/dL — ABNORMAL LOW (ref 12.0–15.0)
MCH: 31.9 pg (ref 26.0–34.0)
MCHC: 35.1 g/dL (ref 30.0–36.0)
MCV: 90.8 fL (ref 80.0–100.0)
Platelets: 153 10*3/uL (ref 150–400)
RBC: 3.04 MIL/uL — ABNORMAL LOW (ref 3.87–5.11)
RDW: 12.6 % (ref 11.5–15.5)
WBC: 7.4 10*3/uL (ref 4.0–10.5)
nRBC: 0 % (ref 0.0–0.2)

## 2022-11-24 LAB — SARS CORONAVIRUS 2 BY RT PCR: SARS Coronavirus 2 by RT PCR: NEGATIVE

## 2022-11-24 LAB — MAGNESIUM: Magnesium: 1.8 mg/dL (ref 1.7–2.4)

## 2022-11-24 LAB — URINE CULTURE

## 2022-11-24 MED ORDER — VANCOMYCIN HCL IN DEXTROSE 1-5 GM/200ML-% IV SOLN
1000.0000 mg | Freq: Once | INTRAVENOUS | Status: AC
  Administered 2022-11-24: 1000 mg via INTRAVENOUS
  Filled 2022-11-24: qty 200

## 2022-11-24 MED ORDER — VANCOMYCIN HCL 750 MG/150ML IV SOLN
750.0000 mg | INTRAVENOUS | Status: DC
  Administered 2022-11-25: 750 mg via INTRAVENOUS
  Filled 2022-11-24: qty 150

## 2022-11-24 NOTE — Progress Notes (Signed)
Patient slept through the night, one complaint of pain and patient requesting acetaminophen at 2309. Patient this as confused to situation, reoriented patient, and provider reassurance that she was asleep and its not a dream. Continued to monitor patient.

## 2022-11-24 NOTE — Progress Notes (Signed)
Occupational Therapy Treatment Patient Details Name: Alison Cuevas MRN: [id number] DOB: [birth year] Today's Date: 11/24/2022   History of present illness Lanay Battie is a 77 y.o. female with medical history significant of GERD, degenerative disc disease, hyperlipidemia, hypertension, syncope, left bundle branch block, and more presents to the ED with a chief complaint of pain.  Chart review reveals that patient came in for near syncopal episode x 2.  Once was on the toilet.  Her last bowel movement was the a.m. of the presentation, but previous to that had been 3 days ago.  Patient had been complaining about lower abdominal pain.  Son told staff that they can no longer take care of patient at home and need her to be placed.  For me, patient is a poor historian and not able to give much to go on.  She reports that she came in for pain.  She reports pain in her neck because the bed is and what she is used to.  When it is pointed out to her that she can come in for pain in her neck caused by this bed because she would have had this bed at home she becomes irritated and says that she had pain in her back.  When she was reminded that she complained of pain in her stomach she said yes she had pain in her stomach 2.  She reports she has been constipated but she is not sure how long.  She does remember having a bowel movement the day of presentation.  She reports she was having abdominal pain for years because she has to pee.  She reports the only reason that her abdominal pain got any better because she had a bowel movement which is due to what she ate.  But then she reports her abdominal pain is no better.  Clearly is convoluted, and it is difficult to decipher what is accurate.  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   OT comments  Pt agreeable to OT treatment but preferred to stay in bed due to just transferring back to bed with nursing and having difficulty controlling bowel movements. Pt  completed B UE strengthening with HOB elevated. Pt also demonstrated ability to don sock supine with HOB elevated in bed. Extended time was needed, but pt did complete donning without assist. Pt left in bed with bed alarm set and call bell within reach. Pt will benefit from continued OT in the hospital and recommended venue below to increase strength, balance, and endurance for safe ADL's.      Recommendations for follow up therapy are one component of a multi-disciplinary discharge planning process, led by the attending physician.  Recommendations may be updated based on patient status, additional functional criteria and insurance authorization.    Assistance Recommended at Discharge Frequent or constant Supervision/Assistance  Patient can return home with the following  A little help with walking and/or transfers;A lot of help with bathing/dressing/bathroom;Assistance with cooking/housework;Assist for transportation;Help with stairs or ramp for entrance   Equipment Recommendations  None recommended by OT          Precautions / Restrictions Precautions Precautions: Fall Required Braces or Orthoses: Other Brace Other Brace: R AFO when standing and transfering. Restrictions Weight Bearing Restrictions: No  ADL either performed or assessed with clinical judgement   ADL Overall ADL's : Needs assistance/impaired                     Lower Body Dressing: Modified independent;Supervision/safety;Bed level Lower Body Dressing Details (indicate cue type and reason): Able to don socks in bed with HOB raised; extended time needed.                      Cognition Arousal/Alertness: Awake/alert Behavior During Therapy: WFL for tasks assessed/performed Overall Cognitive Status: Within Functional Limits for tasks assessed                                          Exercises Exercises:  General Upper Extremity General Exercises - Upper Extremity Shoulder Flexion: AROM, Both, 10 reps, Other (comment) (HOB elevated in bed.) Shoulder ABduction: AROM, Both, 10 reps (HOB elevated in bed.) Shoulder Horizontal ABduction: AROM, Both, 10 reps (HOB elevated in bed.; x10 reps of overhead press and protraction as well.)                 Pertinent Vitals/ Pain       Pain Assessment Pain Assessment: No/denies pain                                                          Frequency  Min 2X/week        Progress Toward Goals  OT Goals(current goals can now be found in the care plan section)  Progress towards OT goals: Progressing toward goals  Acute Rehab OT Goals Patient Stated Goal: Get stronger. OT Goal Formulation: With patient Time For Goal Achievement: 12/07/22 Potential to Achieve Goals: Good ADL Goals Pt Will Perform Lower Body Bathing: with modified independence;sitting/lateral leans Pt Will Perform Lower Body Dressing: with modified independence;sitting/lateral leans Pt Will Transfer to Toilet: with modified independence;ambulating Pt Will Perform Toileting - Clothing Manipulation and hygiene: with modified independence;sitting/lateral leans Pt/caregiver will Perform Home Exercise Program: Increased strength;Both right and left upper extremity;Independently  Plan Discharge plan remains appropriate                                    End of Session    OT Visit Diagnosis: Unsteadiness on feet (R26.81);Other abnormalities of gait and mobility (R26.89);Muscle weakness (generalized) (M62.81);History of falling (Z91.81);Adult, failure to thrive (R62.7)   Activity Tolerance Patient tolerated treatment well   Patient Left in bed;with call bell/phone within reach;with bed alarm set   Nurse Communication          Time: 1030-1050 OT Time Calculation (min): 20 min  Charges: OT General Charges $OT Visit: 1 Visit OT  Treatments $Therapeutic Exercise: 8-22 mins  Tatianna Ibbotson OT, MOT  Danie Chandler 11/24/2022, 11:06 AM

## 2022-11-24 NOTE — Progress Notes (Signed)
Pharmacy Antibiotic Note  Alison Cuevas is a 77 y.o. female admitted on 11/22/2022 with  near syncope .  Pharmacy has been consulted for vancomycin dosing for staph aureus uti.  Patient currently afebrile, wbc normal. Renal function normal.   Plan: Vancomycin 750 IV every 24 hours.  Goal trough 10-15 mcg/mL.  Height: 5' (152.4 cm) Weight: 49.8 kg (109 lb 12.6 oz) IBW/kg (Calculated) : 45.5  Temp (24hrs), Avg:98.5 F (36.9 C), Min:98.1 F (36.7 C), Max:98.8 F (37.1 C)  Recent Labs  Lab 11/20/22 0939 11/22/22 1914 11/23/22 0426 11/24/22 0424  WBC 6.4 6.9 8.1 7.4  CREATININE 0.71 0.58 0.62 0.71    Estimated Creatinine Clearance: 41.6 mL/min (by C-G formula based on SCr of 0.71 mg/dL).    Allergies  Allergen Reactions   Gabapentin Anxiety    Elevated heart rate and crazy dreams   Prednisone Anxiety and Other (See Comments)    REACTION: funny feeling    Thank you for allowing pharmacy to be a part of this patient's care.  Sheppard Coil PharmD., BCPS Clinical Pharmacist 11/24/2022 10:34 AM

## 2022-11-24 NOTE — Progress Notes (Signed)
PROGRESS NOTE  Lajuana Derr ZOX:[id number] DOB: December 17, 1945 DOA: 11/22/2022 PCP: Jeoffrey Massed, MD  Brief History:  77 year old female with a history of hypertension, hyperlipidemia, stroke with chronic right-sided weakness, left bundle branch block presenting with generalized weakness, decreased oral intake, and lower abdominal pain.  The patient is a poor historian secondary to her encephalopathy.  History is obtained from review the medical record and speaking with the patient's son.  The patient's son relates that she has had a functional and constant decline over the past month with decreasing oral intake.  The patient has needed increasing help to even get out of bed.  At baseline, the patient was able to ambulate with a walker.  The patient initially complained of abdominal pain, but could not further elaborate any alleviating or aggravating factors or duration.  She denies any vomiting, diarrhea, chest pain, shortness breath, coughing, headache. Notably, the patient had 2 recent ED visits within the past week prior to this admission.  She had an ED visit on 11/17/2022 for a fall.  CT of the brain and x-ray of the lumbar spine were negative.  She was discharged home in stable condition.  She had a repeat ED visit on 11/20/2022 secondary to a syncopal episode.  This was felt to be vagal.  CT of the brain was negative.  She was discharged home in stable condition. In the ED, the patient had low-grade temperature of 99.0 F.  She was hemodynamically stable.  Oxygen saturation was 95% on room air.  WBC 6.9, hemoglobin 11.9, platelets 1-69,000.  Sodium 126, potassium 4.0, bicarbonate 23, serum creatinine 0.58.  CT of the chest abdomen pelvis showed slight compression fracture of T6, age-indeterminate.  There was moderate stool burden.  There is biapical and bibasilar scarring.  Pelvic x-ray was negative for fracture.  The patient was admitted for further evaluation and treatment of her altered  mental status and increasing generalized weakness.   Assessment/Plan: Acute metabolic encephalopathy -Secondary to dehydration, hyponatremia, and UTI -Continue IV fluids -Continue empiric ceftriaxone -Patient still remains confused -11/20/2022 CT brain negative   Failure to thrive -B12--2458 -Folate--22.8 -TSH--2.104 -PT evaluation>>SNF -Dietitian eval   Frequent falls -PT evaluation>>SNF   UTI -d/c ceftriaxone -start vanc for S. aureus   Hyponatremia -Due to poor solute intake and volume depletion -There is a chronic component dating back to March 2022   Essential hypertension -Restart verapamil and lisinopril   Anxiety -continue home dose alprazolam -PDMP reviewed, alprazolam 0.5 mg, #90 last refill 09/20/2022   Severe Malnutrition -start supplements               Family Communication:  daughter updated 5/30   Consultants:  none   Code Status:  FULL    DVT Prophylaxis:  [place] Heparin   Procedures: As Listed in Progress Note Above   Antibiotics: Ceftriaxone 5/28>>529 Vanc 5/30>>         Subjective: Pt is feeling stronger.  Denies f/c, cp, sob, n/v/d, abd pain  Objective: Vitals:   11/23/22 1608 11/23/22 2005 11/24/22 0622 11/24/22 1356  BP: 96/60 (!) 89/62 120/60 (!) 129/58  Pulse: 79 80 80 89  Resp:  20 20 17   Temp:  98.8 F (37.1 C) 98.1 F (36.7 C) 98.4 F (36.9 C)  TempSrc:  Oral Oral Oral  SpO2: 94% 98% 95% 98%  Weight:      Height:        Intake/Output Summary (Last 24  hours) at 11/24/2022 1800 Last data filed at 11/24/2022 1547 Gross per 24 hour  Intake 2808.12 ml  Output 1850 ml  Net 958.12 ml   Weight change:  Exam:  General:  Pt is alert, follows commands appropriately, not in acute distress HEENT: No icterus, No thrush, No neck mass, [place]/AT Cardiovascular: RRR, S1/S2, no rubs, no gallops Respiratory: CTA bilaterally, no wheezing, no crackles, no rhonchi Abdomen: Soft/+BS, non tender, non distended, no  guarding Extremities: No edema, No lymphangitis, No petechiae, No rashes, no synovitis   Data Reviewed: I have personally reviewed following labs and imaging studies Basic Metabolic Panel: Recent Labs  Lab 11/20/22 0939 11/22/22 1914 11/23/22 0426 11/24/22 0424  NA 127* 126* 130* 128*  K 3.9 4.0 3.6 3.8  CL 94* 93* 96* 97*  CO2 21* 23 21* 25  GLUCOSE 125* 101* 94 96  BUN 11 11 9 14   CREATININE 0.71 0.58 0.62 0.71  CALCIUM 9.1 9.0 8.5* 8.0*  MG  --   --  1.8 1.8   Liver Function Tests: Recent Labs  Lab 11/20/22 0939 11/23/22 0426  AST 24 39  ALT 21 32  ALKPHOS 54 67  BILITOT 0.9 1.2  PROT 6.7 6.1*  ALBUMIN 3.8 3.4*   No results for input(s): "LIPASE", "AMYLASE" in the last 168 hours. No results for input(s): "AMMONIA" in the last 168 hours. Coagulation Profile: No results for input(s): "INR", "PROTIME" in the last 168 hours. CBC: Recent Labs  Lab 11/20/22 0939 11/22/22 1914 11/23/22 0426 11/24/22 0424  WBC 6.4 6.9 8.1 7.4  NEUTROABS 5.2 4.7 6.2  --   HGB 12.8 11.9* 11.8* 9.7*  HCT 35.8* 33.0* 33.2* 27.6*  MCV 89.3 88.0 88.5 90.8  PLT 157 169 167 153   Cardiac Enzymes: No results for input(s): "CKTOTAL", "CKMB", "CKMBINDEX", "TROPONINI" in the last 168 hours. BNP: Invalid input(s): "POCBNP" CBG: Recent Labs  Lab 11/20/22 0917  GLUCAP 137*   HbA1C: No results for input(s): "HGBA1C" in the last 72 hours. Urine analysis:    Component Value Date/Time   COLORURINE YELLOW 11/22/2022 1950   APPEARANCEUR CLEAR 11/22/2022 1950   LABSPEC 1.006 11/22/2022 1950   PHURINE 7.0 11/22/2022 1950   GLUCOSEU NEGATIVE 11/22/2022 1950   GLUCOSEU NEGATIVE 12/09/2013 1440   HGBUR SMALL (A) 11/22/2022 1950   BILIRUBINUR NEGATIVE 11/22/2022 1950   BILIRUBINUR negative 03/19/2021 1750   BILIRUBINUR Negative 05/26/2020 1320   KETONESUR NEGATIVE 11/22/2022 1950   PROTEINUR NEGATIVE 11/22/2022 1950   UROBILINOGEN 0.2 03/19/2021 1750   UROBILINOGEN 0.2 07/30/2014 1528    NITRITE POSITIVE (A) 11/22/2022 1950   LEUKOCYTESUR LARGE (A) 11/22/2022 1950   Sepsis Labs: @LABRCNTIP (procalcitonin:4,lacticidven:4) ) Recent Results (from the past 240 hour(s))  Urine Culture     Status: Abnormal (Preliminary result)   Collection Time: 11/22/22  7:50 PM   Specimen: Urine, Clean Catch  Result Value Ref Range Status   Specimen Description   Final    URINE, CLEAN CATCH Performed at [facility], [address] [place], [place] [address]    Special Requests   Final    NONE Performed at [facility], [address], [place], [place] [address]    Culture (A)  Final    >=100,000 COLONIES/mL STAPHYLOCOCCUS AUREUS SUSCEPTIBILITIES TO FOLLOW Performed at [facility], [address] [place], [place] [address]    Report Status PENDING  Incomplete  Culture, blood (Routine X 2) w Reflex to ID Panel     Status: None (Preliminary result)  Collection Time: 11/24/22  1:39 PM   Specimen: BLOOD LEFT ARM  Result Value Ref Range Status   Specimen Description BLOOD LEFT ARM BOTTLES DRAWN AEROBIC AND ANAEROBIC  Final   Special Requests   Final    Blood Culture adequate volume Performed at [facility], [address] [place], [place] [address]    Culture PENDING  Incomplete   Report Status PENDING  Incomplete  Culture, blood (Routine X 2) w Reflex to ID Panel     Status: None (Preliminary result)   Collection Time: 11/24/22  1:42 PM   Specimen: BLOOD RIGHT HAND  Result Value Ref Range Status   Specimen Description   Final    BLOOD RIGHT HAND BOTTLES DRAWN AEROBIC AND ANAEROBIC   Special Requests   Final    Blood Culture adequate volume Performed at [facility], [address] [place], [place] [address]    Culture PENDING  Incomplete   Report Status PENDING  Incomplete     Scheduled Meds:  ALPRAZolam  0.5 mg Oral QHS   aspirin EC  81 mg Oral Daily   feeding supplement  237 mL Oral BID BM   heparin  5,000 Units Subcutaneous Q8H   lisinopril  10 mg Oral  Daily   magic mouthwash  10 mL Oral QID   multivitamin with minerals  1 tablet Oral Daily   pantoprazole  40 mg Oral QHS   polyethylene glycol  17 g Oral BID   verapamil  180 mg Oral Daily   Continuous Infusions:  sodium chloride 75 mL/hr at 11/24/22 1547   [START ON 11/25/2022] vancomycin      Procedures/Studies: CT L-SPINE NO CHARGE  Result Date: 11/22/2022 CLINICAL DATA:  Low back pain.  Near syncope. EXAM: CT LUMBAR SPINE WITHOUT CONTRAST TECHNIQUE: Multidetector CT imaging of the lumbar spine was performed without intravenous contrast administration. Multiplanar CT image reconstructions were also generated. RADIATION DOSE REDUCTION: This exam was performed according to the departmental dose-optimization program which includes automated exposure control, adjustment of the mA and/or kV according to patient size and/or use of iterative reconstruction technique. COMPARISON:  CT chest abdomen and pelvis today. FINDINGS: Segmentation: 5 lumbar type vertebrae. Alignment: No subluxation.  Convex leftward scoliosis. Vertebrae: No acute fracture or focal pathologic process. Paraspinal and other soft tissues: Negative Disc levels: Advanced diffuse degenerative disc disease and facet disease. Multifactorial neural foraminal narrowing at L2-3 and L3-4 related to posterior spurring, facet disease and ligamentum hypertrophy. No visible disc herniation. IMPRESSION: Convex leftward scoliosis with associated advanced degenerative disc and facet disease. No acute bony abnormality. Electronically Signed   By: Charlett Nose M.D.   On: 11/22/2022 20:28   CT CHEST ABDOMEN PELVIS W CONTRAST  Result Date: 11/22/2022 CLINICAL DATA:  Polytrauma, blunt EXAM: CT CHEST, ABDOMEN, AND PELVIS WITH CONTRAST TECHNIQUE: Multidetector CT imaging of the chest, abdomen and pelvis was performed following the standard protocol during bolus administration of intravenous contrast. RADIATION DOSE REDUCTION: This exam was performed  according to the departmental dose-optimization program which includes automated exposure control, adjustment of the mA and/or kV according to patient size and/or use of iterative reconstruction technique. CONTRAST:  OMNIPAQUE IOHEXOL 300 MG/ML  SOLN COMPARISON:  06/26/2020 FINDINGS: CT CHEST FINDINGS Cardiovascular: Heart is normal size. Aorta normal caliber. Scattered coronary artery and aortic calcifications. Mediastinum/Nodes: No mediastinal, hilar, or axillary adenopathy. Trachea and esophagus are unremarkable. Thyroid unremarkable. Lungs/Pleura: Biapical scarring. Linear scarring or atelectasis in the lung bases. No confluent opacities, effusions or  pneumothorax. Musculoskeletal: Chest wall soft tissues are unremarkable. Slight compression through the superior endplate of T6 compatible with age-indeterminate compression fracture. CT ABDOMEN PELVIS FINDINGS Hepatobiliary: No hepatic injury or perihepatic hematoma. Prior cholecystectomy. Mild intrahepatic and extrahepatic biliary ductal dilatation likely related to patient's age and post cholecystectomy state. 1.8 cm cyst in the inferior right hepatic lobe. Pancreas: No focal abnormality or ductal dilatation. Spleen: No splenic injury or perisplenic hematoma. Adrenals/Urinary Tract: No adrenal hemorrhage or renal injury identified. Bladder is unremarkable. No renal or adrenal mass. No hydronephrosis. Stomach/Bowel: 3 cm duodenal diverticulum. Moderate stool burden throughout the colon. Sigmoid diverticulosis. No active diverticulitis. No bowel obstruction. Vascular/Lymphatic: Aortic atherosclerosis. No evidence of aneurysm or adenopathy. Reproductive: Prior hysterectomy.  No adnexal masses. Other: No free fluid or free air. Musculoskeletal: Convex leftward scoliosis in the lumbar spine. Degenerative disc and facet disease diffusely. No acute bony abnormality. IMPRESSION: Slight compression fracture through the superior endplate of T6, age indeterminate.  Coronary artery disease, aortic atherosclerosis. No acute cardiopulmonary disease. No acute findings in the abdomen or pelvis. Sigmoid diverticulosis.  Moderate stool burden. Electronically Signed   By: Charlett Nose M.D.   On: 11/22/2022 20:25   DG Hip Unilat With Pelvis 2-3 Views Left  Result Date: 11/22/2022 CLINICAL DATA:  Fall, left hip pain EXAM: DG HIP (WITH OR WITHOUT PELVIS) 2-3V LEFT COMPARISON:  Lumbar spine study 11/17/2022 FINDINGS: Convex leftward scoliosis in the lumbar spine. Degenerative changes in the visualized lumbar spine. Hip joints are symmetric. No acute bony abnormality. Specifically, no fracture, subluxation, or dislocation. IMPRESSION: No acute bony abnormality. Electronically Signed   By: Charlett Nose M.D.   On: 11/22/2022 19:22   DG Chest Port 1 View  Result Date: 11/20/2022 CLINICAL DATA:  Syncope with fall. EXAM: PORTABLE CHEST 1 VIEW COMPARISON:  11/02/2021 FINDINGS: The lungs are clear without focal pneumonia, edema, pneumothorax or pleural effusion. The cardiopericardial silhouette is within normal limits for size. Bones are diffusely demineralized. Telemetry leads overlie the chest. IMPRESSION: No active disease. Electronically Signed   By: Kennith Center M.D.   On: 11/20/2022 10:21   CT Head Wo Contrast  Result Date: 11/20/2022 CLINICAL DATA:  Syncope.  Dizziness.  Status post fall. EXAM: CT HEAD WITHOUT CONTRAST TECHNIQUE: Contiguous axial images were obtained from the base of the skull through the vertex without intravenous contrast. RADIATION DOSE REDUCTION: This exam was performed according to the departmental dose-optimization program which includes automated exposure control, adjustment of the mA and/or kV according to patient size and/or use of iterative reconstruction technique. COMPARISON:  11/17/2022 FINDINGS: Brain: There is no evidence for acute hemorrhage, hydrocephalus, mass lesion, or abnormal extra-axial fluid collection. No definite CT evidence for acute  infarction. Patchy low attenuation in the deep hemispheric and periventricular white matter is nonspecific, but likely reflects chronic microvascular ischemic demyelination. Stable appearance right parietal encephalomalacia towards the vertex consistent with remote infarct. Vascular: No hyperdense vessel or unexpected calcification. Skull: No evidence for fracture. No worrisome lytic or sclerotic lesion. Sinuses/Orbits: Chronic opacification left sphenoid sinus, similar to prior. Remaining visualized paranasal sinuses and left mastoid air cells are clear. Surgical changes noted right mastoid air cells with posterior right mastoid effusion, stable. Other: None. IMPRESSION: 1. No acute intracranial abnormality. 2. Stable appearance of right parietal encephalomalacia towards the vertex consistent with remote infarct. 3. Chronic left sphenoid sinusitis. Electronically Signed   By: Kennith Center M.D.   On: 11/20/2022 10:18   DG Lumbar Spine Complete  Result Date: 11/17/2022 CLINICAL  DATA:  Fall. EXAM: LUMBAR SPINE - COMPLETE 4+ VIEW COMPARISON:  CT abdomen pelvis dated June 26, 2020. FINDINGS: Five lumbar type vertebral bodies. No acute fracture or subluxation. Vertebral body heights are preserved. Unchanged prominent levoscoliosis. Unchanged mild retrolisthesis at L1-L2 and L2-L3 and mild anterolisthesis at L3-L4. Unchanged severe disc height loss and facet arthropathy throughout the lumbar spine. IMPRESSION: 1. No acute osseous abnormality. 2. Unchanged severe multilevel lumbar spondylosis. Electronically Signed   By: Obie Dredge M.D.   On: 11/17/2022 17:31   CT Cervical Spine Wo Contrast  Result Date: 11/17/2022 CLINICAL DATA:  Tripped and fell, back pain EXAM: CT CERVICAL SPINE WITHOUT CONTRAST TECHNIQUE: Multidetector CT imaging of the cervical spine was performed without intravenous contrast. Multiplanar CT image reconstructions were also generated. RADIATION DOSE REDUCTION: This exam was performed  according to the departmental dose-optimization program which includes automated exposure control, adjustment of the mA and/or kV according to patient size and/or use of iterative reconstruction technique. COMPARISON:  05/16/2021 FINDINGS: Alignment: Alignment is grossly anatomic. Skull base and vertebrae: No acute fracture. No primary bone lesion or focal pathologic process. Soft tissues and spinal canal: No prevertebral fluid or swelling. No visible canal hematoma. Disc levels: Prior C3-C6 ACDF, with no evidence of orthopedic hardware failure or loosening. Slight progression of the spondylosis seen previously at C2-3 and C6-7. Upper chest: Airway is patent.  Lung apices are clear. Other: Reconstructed images demonstrate no additional findings. IMPRESSION: 1. No acute cervical spine fracture. 2. Stable C3-C6 ACDF. 3. Slight progression of spondylosis at C2-3 and C6-7. Electronically Signed   By: Sharlet Salina M.D.   On: 11/17/2022 16:48   CT Head Wo Contrast  Result Date: 11/17/2022 CLINICAL DATA:  Tripped and fell, lower back pain EXAM: CT HEAD WITHOUT CONTRAST TECHNIQUE: Contiguous axial images were obtained from the base of the skull through the vertex without intravenous contrast. RADIATION DOSE REDUCTION: This exam was performed according to the departmental dose-optimization program which includes automated exposure control, adjustment of the mA and/or kV according to patient size and/or use of iterative reconstruction technique. COMPARISON:  09/04/2021 FINDINGS: Brain: Stable encephalomalacia at the left frontal parietal vertex consistent with prior cortical infarct. Chronic small vessel ischemic changes are seen throughout the periventricular white matter. No evidence of acute infarct or hemorrhage. Lateral ventricles and midline structures are stable. No acute extra-axial fluid collections. No mass effect. Vascular: No hyperdense vessel or unexpected calcification. Skull: Normal. Negative for fracture  or focal lesion. Sinuses/Orbits: Postsurgical changes from prior right mastoidectomy. Small right mastoid effusion is noted. 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 Other: None. IMPRESSION: 1. No acute intracranial process. 2. Stable chronic ischemic changes as above. Electronically Signed   By: Sharlet Salina M.D.   On: 11/17/2022 16:46    Catarina Hartshorn, DO  [facility]  If 7PM-7AM, please contact night-coverage [email or web address] Password [email or web address] 11/24/2022, 6:00 PM   LOS: 1 day

## 2022-11-24 NOTE — TOC Progression Note (Signed)
Transition of Care [facility]) - Progression Note    Patient Details  Name: Alison Cuevas MRN: [id number] Date of Birth: 1945-07-05  Transition of Care [facility]) CM/SW Contact  Karn Cassis, [place] Phone Number: 11/24/2022, 10:24 AM  Clinical Narrative:  Bed accepted at [facility] Kerri at [facility] notified. [facility] updated. Anticipate d/c tomorrow. MD aware pt will need COVID test.      Expected Discharge Plan: Skilled Nursing Facility Barriers to Discharge: Continued Medical Work up  Expected Discharge Plan and Services In-house Referral: Clinical Social Work   Post Acute Care Choice: Skilled Nursing Facility Living arrangements for the past 2 months: Single Family Home                                       Social Determinants of Health (SDOH) Interventions SDOH Screenings   Food Insecurity: No Food Insecurity (11/22/2022)  Housing: Low Risk  (11/22/2022)  Transportation Needs: No Transportation Needs (11/22/2022)  Utilities: Not At Risk (11/22/2022)  Alcohol Screen: Low Risk  (07/20/2022)  Depression (PHQ2-9): Low Risk  (07/20/2022)  Financial Resource Strain: Low Risk  (07/20/2022)  Physical Activity: Sufficiently Active (07/20/2022)  Social Connections: Moderately Isolated (07/20/2022)  Stress: No Stress Concern Present (07/20/2022)  Tobacco Use: Low Risk  (11/22/2022)    Readmission Risk Interventions     No data to display

## 2022-11-25 DIAGNOSIS — R3914 Feeling of incomplete bladder emptying: Secondary | ICD-10-CM | POA: Diagnosis not present

## 2022-11-25 DIAGNOSIS — I4510 Unspecified right bundle-branch block: Secondary | ICD-10-CM | POA: Diagnosis not present

## 2022-11-25 DIAGNOSIS — R1312 Dysphagia, oropharyngeal phase: Secondary | ICD-10-CM | POA: Diagnosis not present

## 2022-11-25 DIAGNOSIS — K5909 Other constipation: Secondary | ICD-10-CM | POA: Diagnosis not present

## 2022-11-25 DIAGNOSIS — E43 Unspecified severe protein-calorie malnutrition: Secondary | ICD-10-CM | POA: Diagnosis not present

## 2022-11-25 DIAGNOSIS — M21371 Foot drop, right foot: Secondary | ICD-10-CM | POA: Diagnosis present

## 2022-11-25 DIAGNOSIS — M797 Fibromyalgia: Secondary | ICD-10-CM | POA: Diagnosis not present

## 2022-11-25 DIAGNOSIS — M6281 Muscle weakness (generalized): Secondary | ICD-10-CM | POA: Diagnosis not present

## 2022-11-25 DIAGNOSIS — I639 Cerebral infarction, unspecified: Secondary | ICD-10-CM | POA: Diagnosis not present

## 2022-11-25 DIAGNOSIS — R488 Other symbolic dysfunctions: Secondary | ICD-10-CM | POA: Diagnosis not present

## 2022-11-25 DIAGNOSIS — M2011 Hallux valgus (acquired), right foot: Secondary | ICD-10-CM | POA: Diagnosis not present

## 2022-11-25 DIAGNOSIS — R7989 Other specified abnormal findings of blood chemistry: Secondary | ICD-10-CM | POA: Diagnosis present

## 2022-11-25 DIAGNOSIS — E871 Hypo-osmolality and hyponatremia: Secondary | ICD-10-CM | POA: Diagnosis not present

## 2022-11-25 DIAGNOSIS — N3000 Acute cystitis without hematuria: Secondary | ICD-10-CM | POA: Diagnosis not present

## 2022-11-25 DIAGNOSIS — G9341 Metabolic encephalopathy: Secondary | ICD-10-CM | POA: Diagnosis not present

## 2022-11-25 DIAGNOSIS — E785 Hyperlipidemia, unspecified: Secondary | ICD-10-CM | POA: Diagnosis not present

## 2022-11-25 DIAGNOSIS — M25471 Effusion, right ankle: Secondary | ICD-10-CM | POA: Diagnosis not present

## 2022-11-25 DIAGNOSIS — N3020 Other chronic cystitis without hematuria: Secondary | ICD-10-CM | POA: Diagnosis not present

## 2022-11-25 DIAGNOSIS — M25571 Pain in right ankle and joints of right foot: Secondary | ICD-10-CM | POA: Diagnosis not present

## 2022-11-25 DIAGNOSIS — R627 Adult failure to thrive: Secondary | ICD-10-CM | POA: Diagnosis not present

## 2022-11-25 DIAGNOSIS — K649 Unspecified hemorrhoids: Secondary | ICD-10-CM | POA: Diagnosis not present

## 2022-11-25 DIAGNOSIS — I69951 Hemiplegia and hemiparesis following unspecified cerebrovascular disease affecting right dominant side: Secondary | ICD-10-CM | POA: Diagnosis not present

## 2022-11-25 DIAGNOSIS — R55 Syncope and collapse: Secondary | ICD-10-CM | POA: Diagnosis not present

## 2022-11-25 DIAGNOSIS — Z9181 History of falling: Secondary | ICD-10-CM | POA: Diagnosis not present

## 2022-11-25 DIAGNOSIS — R2681 Unsteadiness on feet: Secondary | ICD-10-CM | POA: Diagnosis not present

## 2022-11-25 DIAGNOSIS — J3089 Other allergic rhinitis: Secondary | ICD-10-CM | POA: Diagnosis not present

## 2022-11-25 DIAGNOSIS — M79604 Pain in right leg: Secondary | ICD-10-CM | POA: Diagnosis not present

## 2022-11-25 DIAGNOSIS — F411 Generalized anxiety disorder: Secondary | ICD-10-CM | POA: Diagnosis not present

## 2022-11-25 DIAGNOSIS — G8111 Spastic hemiplegia affecting right dominant side: Secondary | ICD-10-CM | POA: Diagnosis not present

## 2022-11-25 DIAGNOSIS — N390 Urinary tract infection, site not specified: Secondary | ICD-10-CM | POA: Diagnosis not present

## 2022-11-25 DIAGNOSIS — M19071 Primary osteoarthritis, right ankle and foot: Secondary | ICD-10-CM | POA: Diagnosis not present

## 2022-11-25 DIAGNOSIS — R262 Difficulty in walking, not elsewhere classified: Secondary | ICD-10-CM | POA: Diagnosis not present

## 2022-11-25 DIAGNOSIS — D696 Thrombocytopenia, unspecified: Secondary | ICD-10-CM | POA: Diagnosis not present

## 2022-11-25 DIAGNOSIS — Z8673 Personal history of transient ischemic attack (TIA), and cerebral infarction without residual deficits: Secondary | ICD-10-CM | POA: Diagnosis not present

## 2022-11-25 DIAGNOSIS — I700 Atherosclerosis of aorta: Secondary | ICD-10-CM | POA: Diagnosis not present

## 2022-11-25 DIAGNOSIS — E041 Nontoxic single thyroid nodule: Secondary | ICD-10-CM | POA: Diagnosis not present

## 2022-11-25 DIAGNOSIS — R498 Other voice and resonance disorders: Secondary | ICD-10-CM | POA: Diagnosis not present

## 2022-11-25 DIAGNOSIS — M7989 Other specified soft tissue disorders: Secondary | ICD-10-CM | POA: Diagnosis not present

## 2022-11-25 DIAGNOSIS — I10 Essential (primary) hypertension: Secondary | ICD-10-CM | POA: Diagnosis not present

## 2022-11-25 DIAGNOSIS — R1030 Lower abdominal pain, unspecified: Secondary | ICD-10-CM | POA: Diagnosis not present

## 2022-11-25 DIAGNOSIS — M1389 Other specified arthritis, multiple sites: Secondary | ICD-10-CM | POA: Diagnosis not present

## 2022-11-25 DIAGNOSIS — K219 Gastro-esophageal reflux disease without esophagitis: Secondary | ICD-10-CM | POA: Diagnosis not present

## 2022-11-25 LAB — URINE CULTURE: Culture: >=100000 — AB

## 2022-11-25 LAB — BASIC METABOLIC PANEL
Anion gap: 6 (ref 5–15)
BUN: 12 mg/dL (ref 8–23)
CO2: 24 mmol/L (ref 22–32)
Calcium: 8 mg/dL — ABNORMAL LOW (ref 8.9–10.3)
Chloride: 102 mmol/L (ref 98–111)
Creatinine, Ser: 0.62 mg/dL (ref 0.44–1.00)
GFR, Estimated: >60 mL/min (ref >60)
Glucose, Bld: 89 mg/dL (ref 70–99)
Potassium: 3.8 mmol/L (ref 3.5–5.1)
Sodium: 132 mmol/L — ABNORMAL LOW (ref 135–145)

## 2022-11-25 LAB — MAGNESIUM: Magnesium: 2.2 mg/dL (ref 1.7–2.4)

## 2022-11-25 MED ORDER — CEPHALEXIN 500 MG PO CAPS: 500.0000 mg | ORAL_CAPSULE | Freq: Three times a day (TID) | ORAL | 0 refills | Status: DC

## 2022-11-25 MED ORDER — ALPRAZOLAM 0.5 MG PO TABS: 0.5000 mg | ORAL_TABLET | Freq: Every evening | ORAL | 0 refills | Status: DC | PRN

## 2022-11-25 MED ORDER — ENSURE ENLIVE PO LIQD: 237.0000 mL | Freq: Two times a day (BID) | ORAL | 12 refills | Status: DC

## 2022-11-25 MED ORDER — CEPHALEXIN 500 MG PO CAPS: 500.0000 mg | ORAL_CAPSULE | Freq: Three times a day (TID) | ORAL | Status: DC

## 2022-11-25 MED ORDER — ADULT MULTIVITAMIN W/MINERALS CH: 1.0000 | ORAL_TABLET | Freq: Every day | ORAL | Status: DC

## 2022-11-25 MED ORDER — CEPHALEXIN 500 MG PO CAPS: 500.0000 mg | ORAL_CAPSULE | Freq: Three times a day (TID) | ORAL | 1 refills | Status: DC

## 2022-11-25 NOTE — Care Management Important Message (Signed)
Important Message  Patient Details  Name: Alison Cuevas MRN: [id number] Date of Birth: 1946-06-05   Medicare Important Message Given:  N/A - LOS <3 / Initial given by admissions     Corey Harold 11/25/2022, 10:28 AM

## 2022-11-25 NOTE — Progress Notes (Signed)
Occupational Therapy Treatment Patient Details Name: Alison Cuevas MRN: [id number] DOB: February 28, 1946 Today's Date: 11/25/2022   History of present illness Alison Cuevas is a 77 y.o. female with medical history significant of GERD, degenerative disc disease, hyperlipidemia, hypertension, syncope, left bundle branch block, and more presents to the ED with a chief complaint of pain.  Chart review reveals that patient came in for near syncopal episode x 2.  Once was on the toilet.  Her last bowel movement was the a.m. of the presentation, but previous to that had been 3 days ago.  Patient had been complaining about lower abdominal pain.  Son told staff that they can no longer take care of patient at home and need her to be placed.  For me, patient is a poor historian and not able to give much to go on.  She reports that she came in for pain.  She reports pain in her neck because the bed is and what she is used to.  When it is pointed out to her that she can come in for pain in her neck caused by this bed because she would have had this bed at home she becomes irritated and says that she had pain in her back.  When she was reminded that she complained of pain in her stomach she said yes she had pain in her stomach 2.  She reports she has been constipated but she is not sure how long.  She does remember having a bowel movement the day of presentation.  She reports she was having abdominal pain for years because she has to pee.  She reports the only reason that her abdominal pain got any better because she had a bowel movement which is due to what she ate.  But then she reports her abdominal pain is no better.  Clearly is convoluted, and it is difficult to decipher what is accurate.  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   OT comments  Pt agreeable to OT and PT co-treatment. Pt demonstrates need for min to mod A for bed mobility with slow labored movement. Pt was able to participate in donning of  shoes and AFO with intermittent assist while seated at EOB. Pt ambulated in the hall with more min G assist using RW with min A to boost from the EOB. Pt left in the bed with the call bell within reach and bed alarm set. Pt will benefit from continued OT in the hospital and recommended venue below to increase strength, balance, and endurance for safe ADL's.      Recommendations for follow up therapy are one component of a multi-disciplinary discharge planning process, led by the attending physician.  Recommendations may be updated based on patient status, additional functional criteria and insurance authorization.    Assistance Recommended at Discharge Frequent or constant Supervision/Assistance  Patient can return home with the following  A little help with walking and/or transfers;A lot of help with bathing/dressing/bathroom;Assistance with cooking/housework;Assist for transportation;Help with stairs or ramp for entrance   Equipment Recommendations  None recommended by OT          Precautions / Restrictions Precautions Precautions: Fall Required Braces or Orthoses: Other Brace Other Brace: R AFO when standing and transfering. Restrictions Weight Bearing Restrictions: No       Mobility Bed Mobility Overal bed mobility: Needs Assistance Bed Mobility: Supine to Sit     Supine to sit: Min assist, Mod assist     General bed  mobility comments: increased time, labored movement    Transfers Overall transfer level: Needs assistance Equipment used: Rolling walker (2 wheels) Transfers: Sit to/from Stand, Bed to chair/wheelchair/BSC Sit to Stand: Min assist     Step pivot transfers: Min guard, Min assist     General transfer comment: labored movement     Balance Overall balance assessment: Needs assistance Sitting-balance support: Feet supported, No upper extremity supported Sitting balance-Leahy Scale: Fair Sitting balance - Comments: fair/good seated at EOB   Standing  balance support: Reliant on assistive device for balance, During functional activity, Bilateral upper extremity supported Standing balance-Leahy Scale: Fair Standing balance comment: fair/poor using RW                           ADL either performed or assessed with clinical judgement   ADL Overall ADL's : Needs assistance/impaired                     Lower Body Dressing: Moderate assistance;Sitting/lateral leans Lower Body Dressing Details (indicate cue type and reason): Mod A to don R shoe and AFO. Able to don L shoe with min G to min A. Assisted to doff shoes. All completed seated at EOB.             Functional mobility during ADLs: Min guard;Minimal assistance;Rolling walker (2 wheels) General ADL Comments: Pt ambulate ~40 feet with RW; min A to boost from EOB prior to walking.      Cognition Arousal/Alertness: Awake/alert Behavior During Therapy: WFL for tasks assessed/performed Overall Cognitive Status: Within Functional Limits for tasks assessed                                                             Pertinent Vitals/ Pain       Pain Assessment Pain Assessment: Faces Faces Pain Scale: Hurts a little bit Pain Location: stomach Pain Descriptors / Indicators: Discomfort Pain Intervention(s): Limited activity within patient's tolerance, Monitored during session, Repositioned                                                          Frequency  Min 2X/week        Progress Toward Goals  OT Goals(current goals can now be found in the care plan section)  Progress towards OT goals: Progressing toward goals  Acute Rehab OT Goals Patient Stated Goal: Get stronger OT Goal Formulation: With patient Time For Goal Achievement: 12/07/22 Potential to Achieve Goals: Good ADL Goals Pt Will Perform Lower Body Bathing: with modified independence;sitting/lateral leans Pt Will Perform Lower Body  Dressing: with modified independence;sitting/lateral leans Pt Will Transfer to Toilet: with modified independence;ambulating Pt Will Perform Toileting - Clothing Manipulation and hygiene: with modified independence;sitting/lateral leans Pt/caregiver will Perform Home Exercise Program: Increased strength;Both right and left upper extremity;Independently  Plan Discharge plan remains appropriate    Co-evaluation    PT/OT/SLP Co-Evaluation/Treatment: Yes Reason for Co-Treatment: To address functional/ADL transfers   OT goals addressed during session: ADL's and self-care  End of Session Equipment Utilized During Treatment: Rolling walker (2 wheels);Other (comment) (R AFO)  OT Visit Diagnosis: Unsteadiness on feet (R26.81);Other abnormalities of gait and mobility (R26.89);Muscle weakness (generalized) (M62.81);History of falling (Z91.81);Adult, failure to thrive (R62.7)   Activity Tolerance Patient tolerated treatment well   Patient Left in bed;with call bell/phone within reach;with bed alarm set             Time: 1610-9604 OT Time Calculation (min): 23 min  Charges: OT General Charges $OT Visit: 1 Visit OT Treatments $Self Care/Home Management : 8-22 mins  [facility] OT, MOT  Danie Chandler 11/25/2022, 8:57 AM

## 2022-11-25 NOTE — TOC Transition Note (Addendum)
Transition of Care [facility]) - CM/SW Discharge Note   Patient Details  Name: Alison Cuevas MRN: [id number] Date of Birth: [birth year]  Transition of Care [facility] CM/SW Contact:  Elliot Gault, LCSW Phone Number: 11/25/2022, 10:09 AM   Clinical Narrative:     Pt medically stable for dc per MD. Sherron Monday with Lynnea Ferrier at [facility] and they can admit pt today. Pt/family remain in agreement with dc plan.  DC clinical sent electronically. RN to call report.  Emailed [facility] to update on dc.  There are no other TOC needs for dc.  Final next level of care: Skilled Nursing Facility Barriers to Discharge: Barriers Resolved   Patient Goals and CMS Choice   Choice offered to / list presented to : Patient, Adult Children  Discharge Placement                Patient chooses bed at: [facility] Patient to be transferred to facility by: w/c Name of family member notified: Leonette Most Patient and family notified of of transfer: 11/25/22  Discharge Plan and Services Additional resources added to the After Visit Summary for   In-house Referral: Clinical Social Work   Post Acute Care Choice: Skilled Nursing Facility                               Social Determinants of Health (SDOH) Interventions SDOH Screenings   Food Insecurity: No Food Insecurity (11/22/2022)  Housing: Low Risk  (11/22/2022)  Transportation Needs: No Transportation Needs (11/22/2022)  Utilities: Not At Risk (11/22/2022)  Alcohol Screen: Low Risk  (07/20/2022)  Depression (PHQ2-9): Low Risk  (07/20/2022)  Financial Resource Strain: Low Risk  (07/20/2022)  Physical Activity: Sufficiently Active (07/20/2022)  Social Connections: Moderately Isolated (07/20/2022)  Stress: No Stress Concern Present (07/20/2022)  Tobacco Use: Low Risk  (11/22/2022)     Readmission Risk Interventions     No data to display

## 2022-11-25 NOTE — Progress Notes (Signed)
Physical Therapy Treatment Patient Details Name: Alison Cuevas MRN: [id number] DOB: [birth year] Today's Date: 11/25/2022   History of Present Illness Alison Cuevas is a 77 y.o. female with medical history significant of GERD, degenerative disc disease, hyperlipidemia, hypertension, syncope, left bundle branch block, and more presents to the ED with a chief complaint of pain.  Chart review reveals that patient came in for near syncopal episode x 2.  Once was on the toilet.  Her last bowel movement was the a.m. of the presentation, but previous to that had been 3 days ago.  Patient had been complaining about lower abdominal pain.  Son told staff that they can no longer take care of patient at home and need her to be placed.  For me, patient is a poor historian and not able to give much to go on.  She reports that she came in for pain.  She reports pain in her neck because the bed is and what she is used to.  When it is pointed out to her that she can come in for pain in her neck caused by this bed because she would have had this bed at home she becomes irritated and says that she had pain in her back.  When she was reminded that she complained of pain in her stomach she said yes she had pain in her stomach 2.  She reports she has been constipated but she is not sure how long.  She does remember having a bowel movement the day of presentation.  She reports she was having abdominal pain for years because she has to pee.  She reports the only reason that her abdominal pain got any better because she had a bowel movement which is due to what she ate.  But then she reports her abdominal pain is no better.  Clearly is convoluted, and it is difficult to decipher what is accurate.  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    PT Comments    Patient requiring min to mod assist for mobility today due to weakness. She demonstrates improving ambulation ability today with RW without loss of balance. Patient  will benefit from continued skilled physical therapy in hospital and recommended venue below to increase strength, balance, endurance for safe ADLs and gait.    Recommendations for follow up therapy are one component of a multi-disciplinary discharge planning process, led by the attending physician.  Recommendations may be updated based on patient status, additional functional criteria and insurance authorization.  Follow Up Recommendations  Can patient physically be transported by private vehicle: Yes    Assistance Recommended at Discharge Set up Supervision/Assistance  Patient can return home with the following A lot of help with bathing/dressing/bathroom;A lot of help with walking and/or transfers;Help with stairs or ramp for entrance;Assistance with cooking/housework   Equipment Recommendations       Recommendations for Other Services       Precautions / Restrictions Precautions Precautions: Fall Required Braces or Orthoses: Other Brace Other Brace: R AFO when standing and transfering. Restrictions Weight Bearing Restrictions: No     Mobility  Bed Mobility Overal bed mobility: Needs Assistance Bed Mobility: Supine to Sit, Sit to Supine     Supine to sit: Min assist, Mod assist Sit to supine: Min assist   General bed mobility comments: increased time, labored movement    Transfers Overall transfer level: Needs assistance Equipment used: Rolling walker (2 wheels) Transfers: Sit to/from Stand Sit to Stand: Min assist  Step pivot transfers: Min guard, Min assist       General transfer comment: labored movement    Ambulation/Gait Ambulation/Gait assistance: Min assist Gait Distance (Feet): 40 Feet Assistive device: Rolling walker (2 wheels) Gait Pattern/deviations: Decreased step length - right, Decreased step length - left, Decreased stance time - right, Decreased stride length, Antalgic Gait velocity: decreased     General Gait Details: slow, labored with  RW, no loss of balance   Stairs             Wheelchair Mobility    Modified Rankin (Stroke Patients Only)       Balance Overall balance assessment: Needs assistance Sitting-balance support: Feet supported, No upper extremity supported Sitting balance-Leahy Scale: Fair Sitting balance - Comments: fair/good seated at EOB   Standing balance support: Reliant on assistive device for balance, During functional activity, Bilateral upper extremity supported Standing balance-Leahy Scale: Fair Standing balance comment: fair/poor using RW                            Cognition Arousal/Alertness: Awake/alert Behavior During Therapy: WFL for tasks assessed/performed Overall Cognitive Status: Within Functional Limits for tasks assessed                                          Exercises      General Comments        Pertinent Vitals/Pain Pain Assessment Pain Assessment: Faces Faces Pain Scale: Hurts a little bit Pain Location: stomach Pain Descriptors / Indicators: Discomfort Pain Intervention(s): Limited activity within patient's tolerance, Monitored during session, Repositioned    Home Living                          Prior Function            PT Goals (current goals can now be found in the care plan section) Acute Rehab PT Goals Patient Stated Goal: return home after rehab PT Goal Formulation: With patient/family Time For Goal Achievement: 12/07/22 Potential to Achieve Goals: Good Progress towards PT goals: Progressing toward goals    Frequency    Min 3X/week      PT Plan Current plan remains appropriate    Co-evaluation   Reason for Co-Treatment: To address functional/ADL transfers PT goals addressed during session: Mobility/safety with mobility;Balance;Proper use of DME;Strengthening/ROM OT goals addressed during session: ADL's and self-care      AM-PAC PT "6 Clicks" Mobility   Outcome Measure  Help needed  turning from your back to your side while in a flat bed without using bedrails?: None Help needed moving from lying on your back to sitting on the side of a flat bed without using bedrails?: A Little Help needed moving to and from a bed to a chair (including a wheelchair)?: A Lot Help needed standing up from a chair using your arms (e.g., wheelchair or bedside chair)?: A Lot Help needed to walk in hospital room?: A Little Help needed climbing 3-5 steps with a railing? : A Lot 6 Click Score: 16    End of Session   Activity Tolerance: Patient tolerated treatment well;Patient limited by fatigue Patient left: in bed;with call bell/phone within reach;with bed alarm set Nurse Communication: Mobility status PT Visit Diagnosis: Unsteadiness on feet (R26.81);Other abnormalities of gait and mobility (R26.89);Muscle weakness (generalized) (M62.81)  Time: 1610-9604 PT Time Calculation (min) (ACUTE ONLY): 23 min  Charges:  $Therapeutic Activity: 8-22 mins                     9:31 AM, 11/25/22 Wyman Songster PT, DPT Physical Therapist at [facility]

## 2022-11-25 NOTE — Progress Notes (Signed)
Patient slept on and off this shift. Acetaminophen and morphine each given once this shift for pain. Had to be reassured to situation through out the shift. Continued to monitor.

## 2022-11-25 NOTE — Discharge Summary (Addendum)
Physician Discharge Summary   Patient: Alison Cuevas MRN: [id number] DOB: 1945-07-05  Admit date:     11/22/2022  Discharge date: 11/25/22  Discharge Physician: Onalee Hua Shulamit Donofrio   PCP: Jeoffrey Massed, MD   Recommendations at discharge:   Please follow up with primary care provider within 1-2 weeks  Please repeat BMP and CBC in one week   Hospital Course: 77 year old female with a history of hypertension, hyperlipidemia, stroke with chronic right-sided weakness, left bundle branch block presenting with generalized weakness, decreased oral intake, and lower abdominal pain.  The patient is a poor historian secondary to her encephalopathy.  History is obtained from review the medical record and speaking with the patient's son.  The patient's son relates that she has had a functional and constant decline over the past month with decreasing oral intake.  The patient has needed increasing help to even get out of bed.  At baseline, the patient was able to ambulate with a walker.  The patient initially complained of abdominal pain, but could not further elaborate any alleviating or aggravating factors or duration.  She denies any vomiting, diarrhea, chest pain, shortness breath, coughing, headache. Notably, the patient had 2 recent ED visits within the past week prior to this admission.  She had an ED visit on 11/17/2022 for a fall.  CT of the brain and x-ray of the lumbar spine were negative.  She was discharged home in stable condition.  She had a repeat ED visit on 11/20/2022 secondary to a syncopal episode.  This was felt to be vagal.  CT of the brain was negative.  She was discharged home in stable condition. In the ED, the patient had low-grade temperature of 99.0 F.  She was hemodynamically stable.  Oxygen saturation was 95% on room air.  WBC 6.9, hemoglobin 11.9, platelets 1-69,000.  Sodium 126, potassium 4.0, bicarbonate 23, serum creatinine 0.58.  CT of the chest abdomen pelvis showed slight compression  fracture of T6, age-indeterminate.  There was moderate stool burden.  There is biapical and bibasilar scarring.  Pelvic x-ray was negative for fracture.  The patient was admitted for further evaluation and treatment of her altered mental status and increasing generalized weakness.  Assessment and Plan: Acute metabolic encephalopathy -Secondary to dehydration, hyponatremia, and UTI -Continue IV fluids -initially on empiric ceftriaxone -11/20/2022 CT brain negative -5/31--mental status near baseline, family agrees   Failure to thrive -B12--2458 -Folate--22.8 -TSH--2.104 -PT evaluation>>SNF -Dietitian eval appreciated>>Ensure   Frequent falls -PT evaluation>>SNF   UTI?--Staph aureus -d/c ceftriaxone, then placed on vanco -started vanc for S. Aureus>>MSSA -blood culture neg to date -CT abd/pelvis reassuring -pt did have "instrumentation" with in/out cath on 5/26 prior to this admission -discussed with ID--Dr. Judie Petit. Singh>>pt had hx of some bladder mesh repair in past>>with unremarkable CT abd, pt can follow up with outpt urology -Dr. Thedore Mins recommended continuing on cephalexin indefinitely until pt follow up with urology to clarify if bladder/graft infection -d/c with cephalexin--indefinitely until patient able to follow up with urology  -I discussed with son and told him I have personally made a referral for outpt urology and he was given contact info for Dr. McDiarmid whom pt had seen in past   Hyponatremia -Due to poor solute intake and volume depletion -There is a chronic component dating back to March 2022 -overall improved 126>>132 with IVF   Essential hypertension -Restart verapamil and lisinopril   Anxiety -continue home dose alprazolam -PDMP reviewed, alprazolam 0.5 mg, #90 last refill 09/20/2022  Severe Malnutrition -start supplements--Ensure  Abdominal Pain -5/28 CT abd/pelvis--no acute findings -son notes chronic on/off abd pain in past -suspect component of  constipation based on CT -start daily miralax -tolerating diet        Pain control - [place] Controlled Substance Reporting System database was reviewed. and patient was instructed, not to drive, operate heavy machinery, perform activities at heights, swimming or participation in water activities or provide baby-sitting services while on Pain, Sleep and Anxiety Medications; until their outpatient Physician has advised to do so again. Also recommended to not to take more than prescribed Pain, Sleep and Anxiety Medications.  Consultants: none Procedures performed: none  Disposition: Skilled nursing facility Diet recommendation:  Cardiac diet DISCHARGE MEDICATION: Allergies as of 11/25/2022       Reactions   Gabapentin Anxiety   Elevated heart rate and crazy dreams   Prednisone Anxiety, Other (See Comments)   REACTION: funny feeling        Medication List     STOP taking these medications    HYDROcodone-acetaminophen 5-325 MG tablet Commonly known as: NORCO/VICODIN   Klor-Con M20 20 MEQ tablet Generic drug: potassium chloride SA   magic mouthwash (nystatin, lidocaine, diphenhydrAMINE, alum & mag hydroxide) suspension   tiZANidine 2 MG tablet Commonly known as: ZANAFLEX       TAKE these medications    acetaminophen 500 MG tablet Commonly known as: TYLENOL Take 1 tablet (500 mg total) by mouth 3 (three) times daily.   ALPRAZolam 0.5 MG tablet Commonly known as: XANAX Take 1 tablet (0.5 mg total) by mouth at bedtime as needed. What changed: See the new instructions.   aspirin EC 81 MG tablet Take 1 tablet (81 mg total) by mouth daily. Swallow whole.   Biotin 5 MG Tabs Take 5 mg by mouth daily.   CALCIUM-MAGNESIUM-ZINC-D3 PO Take 1 tablet by mouth daily.   cephALEXin 500 MG capsule Commonly known as: KEFLEX Take 1 capsule (500 mg total) by mouth every 8 (eight) hours. Continue indefinitely until patient follows up with urology as outpatient    Clinpro 5000 1.1 % Pste Generic drug: Sodium Fluoride Take 1 Application by mouth at bedtime.   clobetasol 0.05 % external solution Commonly known as: TEMOVATE Apply topically.   feeding supplement Liqd Take 237 mLs by mouth 2 (two) times daily between meals.   fluticasone 50 MCG/ACT nasal spray Commonly known as: FLONASE Place 2 sprays into both nostrils at bedtime. What changed: how much to take   lisinopril 10 MG tablet Commonly known as: ZESTRIL Take 1 tablet (10 mg total) by mouth daily. MUST KEEP APPT FOR FURTHER REFILLS   loratadine 10 MG tablet Commonly known as: CLARITIN Take 10 mg by mouth every other day.   meclizine 12.5 MG tablet Commonly known as: ANTIVERT Take 1 tablet (12.5 mg total) by mouth 3 (three) times daily. 1 tab po bid prn dizziness   multivitamin with minerals Tabs tablet Take 1 tablet by mouth daily. Start taking on: November 26, 2022   pantoprazole 40 MG tablet Commonly known as: PROTONIX TAKE 1 TABLET BY MOUTH EVERYDAY AT BEDTIME What changed: See the new instructions.   polyethylene glycol 17 g packet Commonly known as: MIRALAX / GLYCOLAX Take 17 g by mouth 3 (three) times daily.   verapamil 180 MG CR tablet Commonly known as: CALAN-SR Take 1 tablet (180 mg total) by mouth daily.        Contact information for follow-up providers     McKenzie,  Mardene Celeste, MD Follow up in 2 week(s).   Specialty: Urology Contact information: [address]  [place] [address] [phone]              Contact information for after-discharge care     Destination     [facility] .   Service: Skilled Nursing Contact information: [address] [place] [address] [phone]                    Discharge Exam: Ceasar Mons Weights   11/22/22 1915 11/22/22 2323  Weight: 51.7 kg 49.8 kg   HEENT:  [employer]/AT, No thrush, no icterus CV:  RRR, no rub, no S3, no S4 Lung:  CTA, no  wheeze, no rhonchi Abd:  soft/+BS, NT Ext:  No edema, no lymphangitis, no synovitis, no rash   Condition at discharge: stable  The results of significant diagnostics from this hospitalization (including imaging, microbiology, ancillary and laboratory) are listed below for reference.   Imaging Studies: CT L-SPINE NO CHARGE  Result Date: 11/22/2022 CLINICAL DATA:  Low back pain.  Near syncope. EXAM: CT LUMBAR SPINE WITHOUT CONTRAST TECHNIQUE: Multidetector CT imaging of the lumbar spine was performed without intravenous contrast administration. Multiplanar CT image reconstructions were also generated. RADIATION DOSE REDUCTION: This exam was performed according to the departmental dose-optimization program which includes automated exposure control, adjustment of the mA and/or kV according to patient size and/or use of iterative reconstruction technique. COMPARISON:  CT chest abdomen and pelvis today. FINDINGS: Segmentation: 5 lumbar type vertebrae. Alignment: No subluxation.  Convex leftward scoliosis. Vertebrae: No acute fracture or focal pathologic process. Paraspinal and other soft tissues: Negative Disc levels: Advanced diffuse degenerative disc disease and facet disease. Multifactorial neural foraminal narrowing at L2-3 and L3-4 related to posterior spurring, facet disease and ligamentum hypertrophy. No visible disc herniation. IMPRESSION: Convex leftward scoliosis with associated advanced degenerative disc and facet disease. No acute bony abnormality. Electronically Signed   By: Charlett Nose M.D.   On: 11/22/2022 20:28   CT CHEST ABDOMEN PELVIS W CONTRAST  Result Date: 11/22/2022 CLINICAL DATA:  Polytrauma, blunt EXAM: CT CHEST, ABDOMEN, AND PELVIS WITH CONTRAST TECHNIQUE: Multidetector CT imaging of the chest, abdomen and pelvis was performed following the standard protocol during bolus administration of intravenous contrast. RADIATION DOSE REDUCTION: This exam was performed according to the  departmental dose-optimization program which includes automated exposure control, adjustment of the mA and/or kV according to patient size and/or use of iterative reconstruction technique. CONTRAST:  OMNIPAQUE IOHEXOL 300 MG/ML  SOLN COMPARISON:  06/26/2020 FINDINGS: CT CHEST FINDINGS Cardiovascular: Heart is normal size. Aorta normal caliber. Scattered coronary artery and aortic calcifications. Mediastinum/Nodes: No mediastinal, hilar, or axillary adenopathy. Trachea and esophagus are unremarkable. Thyroid unremarkable. Lungs/Pleura: Biapical scarring. Linear scarring or atelectasis in the lung bases. No confluent opacities, effusions or pneumothorax. Musculoskeletal: Chest wall soft tissues are unremarkable. Slight compression through the superior endplate of T6 compatible with age-indeterminate compression fracture. CT ABDOMEN PELVIS FINDINGS Hepatobiliary: No hepatic injury or perihepatic hematoma. Prior cholecystectomy. Mild intrahepatic and extrahepatic biliary ductal dilatation likely related to patient's age and post cholecystectomy state. 1.8 cm cyst in the inferior right hepatic lobe. Pancreas: No focal abnormality or ductal dilatation. Spleen: No splenic injury or perisplenic hematoma. Adrenals/Urinary Tract: No adrenal hemorrhage or renal injury identified. Bladder is unremarkable. No renal or adrenal mass. No hydronephrosis. Stomach/Bowel: 3 cm duodenal diverticulum. Moderate stool burden throughout the colon. Sigmoid diverticulosis. No  active diverticulitis. No bowel obstruction. Vascular/Lymphatic: Aortic atherosclerosis. No evidence of aneurysm or adenopathy. Reproductive: Prior hysterectomy.  No adnexal masses. Other: No free fluid or free air. Musculoskeletal: Convex leftward scoliosis in the lumbar spine. Degenerative disc and facet disease diffusely. No acute bony abnormality. IMPRESSION: Slight compression fracture through the superior endplate of T6, age indeterminate. Coronary artery  disease, aortic atherosclerosis. No acute cardiopulmonary disease. No acute findings in the abdomen or pelvis. Sigmoid diverticulosis.  Moderate stool burden. Electronically Signed   By: Charlett Nose M.D.   On: 11/22/2022 20:25   DG Hip Unilat With Pelvis 2-3 Views Left  Result Date: 11/22/2022 CLINICAL DATA:  Fall, left hip pain EXAM: DG HIP (WITH OR WITHOUT PELVIS) 2-3V LEFT COMPARISON:  Lumbar spine study 11/17/2022 FINDINGS: Convex leftward scoliosis in the lumbar spine. Degenerative changes in the visualized lumbar spine. Hip joints are symmetric. No acute bony abnormality. Specifically, no fracture, subluxation, or dislocation. IMPRESSION: No acute bony abnormality. Electronically Signed   By: Charlett Nose M.D.   On: 11/22/2022 19:22   DG Chest Port 1 View  Result Date: 11/20/2022 CLINICAL DATA:  Syncope with fall. EXAM: PORTABLE CHEST 1 VIEW COMPARISON:  11/02/2021 FINDINGS: The lungs are clear without focal pneumonia, edema, pneumothorax or pleural effusion. The cardiopericardial silhouette is within normal limits for size. Bones are diffusely demineralized. Telemetry leads overlie the chest. IMPRESSION: No active disease. Electronically Signed   By: Kennith Center M.D.   On: 11/20/2022 10:21   CT Head Wo Contrast  Result Date: 11/20/2022 CLINICAL DATA:  Syncope.  Dizziness.  Status post fall. EXAM: CT HEAD WITHOUT CONTRAST TECHNIQUE: Contiguous axial images were obtained from the base of the skull through the vertex without intravenous contrast. RADIATION DOSE REDUCTION: This exam was performed according to the departmental dose-optimization program which includes automated exposure control, adjustment of the mA and/or kV according to patient size and/or use of iterative reconstruction technique. COMPARISON:  11/17/2022 FINDINGS: Brain: There is no evidence for acute hemorrhage, hydrocephalus, mass lesion, or abnormal extra-axial fluid collection. No definite CT evidence for acute infarction.  Patchy low attenuation in the deep hemispheric and periventricular white matter is nonspecific, but likely reflects chronic microvascular ischemic demyelination. Stable appearance right parietal encephalomalacia towards the vertex consistent with remote infarct. Vascular: No hyperdense vessel or unexpected calcification. Skull: No evidence for fracture. No worrisome lytic or sclerotic lesion. Sinuses/Orbits: Chronic opacification left sphenoid sinus, similar to prior. Remaining visualized paranasal sinuses and left mastoid air cells are clear. Surgical changes noted right mastoid air cells with posterior right mastoid effusion, stable. Other: None. IMPRESSION: 1. No acute intracranial abnormality. 2. Stable appearance of right parietal encephalomalacia towards the vertex consistent with remote infarct. 3. Chronic left sphenoid sinusitis. Electronically Signed   By: Kennith Center M.D.   On: 11/20/2022 10:18   DG Lumbar Spine Complete  Result Date: 11/17/2022 CLINICAL DATA:  Fall. EXAM: LUMBAR SPINE - COMPLETE 4+ VIEW COMPARISON:  CT abdomen pelvis dated June 26, 2020. FINDINGS: Five lumbar type vertebral bodies. No acute fracture or subluxation. Vertebral body heights are preserved. Unchanged prominent levoscoliosis. Unchanged mild retrolisthesis at L1-L2 and L2-L3 and mild anterolisthesis at L3-L4. Unchanged severe disc height loss and facet arthropathy throughout the lumbar spine. IMPRESSION: 1. No acute osseous abnormality. 2. Unchanged severe multilevel lumbar spondylosis. Electronically Signed   By: Obie Dredge M.D.   On: 11/17/2022 17:31   CT Cervical Spine Wo Contrast  Result Date: 11/17/2022 CLINICAL DATA:  Tripped and fell, back  pain EXAM: CT CERVICAL SPINE WITHOUT CONTRAST TECHNIQUE: Multidetector CT imaging of the cervical spine was performed without intravenous contrast. Multiplanar CT image reconstructions were also generated. RADIATION DOSE REDUCTION: This exam was performed according to  the departmental dose-optimization program which includes automated exposure control, adjustment of the mA and/or kV according to patient size and/or use of iterative reconstruction technique. COMPARISON:  05/16/2021 FINDINGS: Alignment: Alignment is grossly anatomic. Skull base and vertebrae: No acute fracture. No primary bone lesion or focal pathologic process. Soft tissues and spinal canal: No prevertebral fluid or swelling. No visible canal hematoma. Disc levels: Prior C3-C6 ACDF, with no evidence of orthopedic hardware failure or loosening. Slight progression of the spondylosis seen previously at C2-3 and C6-7. Upper chest: Airway is patent.  Lung apices are clear. Other: Reconstructed images demonstrate no additional findings. IMPRESSION: 1. No acute cervical spine fracture. 2. Stable C3-C6 ACDF. 3. Slight progression of spondylosis at C2-3 and C6-7. Electronically Signed   By: Sharlet Salina M.D.   On: 11/17/2022 16:48   CT Head Wo Contrast  Result Date: 11/17/2022 CLINICAL DATA:  Tripped and fell, lower back pain EXAM: CT HEAD WITHOUT CONTRAST TECHNIQUE: Contiguous axial images were obtained from the base of the skull through the vertex without intravenous contrast. RADIATION DOSE REDUCTION: This exam was performed according to the departmental dose-optimization program which includes automated exposure control, adjustment of the mA and/or kV according to patient size and/or use of iterative reconstruction technique. COMPARISON:  09/04/2021 FINDINGS: Brain: Stable encephalomalacia at the left frontal parietal vertex consistent with prior cortical infarct. Chronic small vessel ischemic changes are seen throughout the periventricular white matter. No evidence of acute infarct or hemorrhage. Lateral ventricles and midline structures are stable. No acute extra-axial fluid collections. No mass effect. Vascular: No hyperdense vessel or unexpected calcification. Skull: Normal. Negative for fracture or focal  lesion. Sinuses/Orbits: Postsurgical changes from prior right mastoidectomy. Small right mastoid effusion is noted. 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 Other: None. IMPRESSION: 1. No acute intracranial process. 2. Stable chronic ischemic changes as above. Electronically Signed   By: Sharlet Salina M.D.   On: 11/17/2022 16:46    Microbiology: Results for orders placed or performed during the hospital encounter of 11/22/22  Urine Culture     Status: Abnormal   Collection Time: 11/22/22  7:50 PM   Specimen: Urine, Clean Catch  Result Value Ref Range Status   Specimen Description   Final    URINE, CLEAN CATCH Performed at [facility], [address] [place], [place] [address]    Special Requests   Final    NONE Performed at [facility], [address] [place], [place] [address]    Culture >=100,000 COLONIES/mL STAPHYLOCOCCUS AUREUS (A)  Final   Report Status 11/25/2022 FINAL  Final   Organism ID, Bacteria STAPHYLOCOCCUS AUREUS (A)  Final      Susceptibility   Staphylococcus aureus - MIC*    CIPROFLOXACIN <=0.5 SENSITIVE Sensitive     GENTAMICIN <=0.5 SENSITIVE Sensitive     NITROFURANTOIN <=16 SENSITIVE Sensitive     OXACILLIN 0.5 SENSITIVE Sensitive     TETRACYCLINE <=1 SENSITIVE Sensitive     VANCOMYCIN <=0.5 SENSITIVE Sensitive     TRIMETH/SULFA <=10 SENSITIVE Sensitive     CLINDAMYCIN <=0.25 SENSITIVE Sensitive     RIFAMPIN <=0.5 SENSITIVE Sensitive     Inducible Clindamycin NEGATIVE Sensitive     LINEZOLID 2 SENSITIVE Sensitive     * >=100,000 COLONIES/mL STAPHYLOCOCCUS AUREUS  Culture, blood (Routine X 2) w Reflex to ID Panel     Status: None (Preliminary result)   Collection Time: 11/24/22  1:39 PM   Specimen: BLOOD LEFT ARM  Result Value Ref Range Status   Specimen Description BLOOD LEFT ARM BOTTLES DRAWN AEROBIC AND ANAEROBIC  Final   Special Requests Blood Culture adequate volume  Final   Culture   Final    NO GROWTH < 24 HOURS Performed at  [facility], [address] [place], [place] [address]    Report Status PENDING  Incomplete  Culture, blood (Routine X 2) w Reflex to ID Panel     Status: None (Preliminary result)   Collection Time: 11/24/22  1:42 PM   Specimen: BLOOD RIGHT HAND  Result Value Ref Range Status   Specimen Description   Final    BLOOD RIGHT HAND BOTTLES DRAWN AEROBIC AND ANAEROBIC   Special Requests Blood Culture adequate volume  Final   Culture   Final    NO GROWTH < 24 HOURS Performed at [facility], [address], [place], [place] [address]    Report Status PENDING  Incomplete  SARS Coronavirus 2 by RT PCR (hospital order, performed in [facility] hospital lab) *cepheid single result test* Anterior Nasal Swab     Status: None   Collection Time: 11/24/22  6:32 PM   Specimen: Anterior Nasal Swab  Result Value Ref Range Status   SARS Coronavirus 2 by RT PCR NEGATIVE NEGATIVE Final    Comment: (NOTE) SARS-CoV-2 target nucleic acids are NOT DETECTED.  The SARS-CoV-2 RNA is generally detectable in upper and lower respiratory specimens during the acute phase of infection. The lowest concentration of SARS-CoV-2 viral copies this assay can detect is 250 copies / mL. A negative result does not preclude SARS-CoV-2 infection and should not be used as the sole basis for treatment or other patient management decisions.  A negative result may occur with improper specimen collection / handling, submission of specimen other than nasopharyngeal swab, presence of viral mutation(s) within the areas targeted by this assay, and inadequate number of viral copies (<250 copies / mL). A negative result must be combined with clinical observations, patient history, and epidemiological information.  Fact Sheet for Patients:   RoadLapTop.co.za  Fact Sheet for Healthcare Providers: http://kim-miller.com/  This test is not yet approved or  cleared by the [place] FDA  and has been authorized for detection and/or diagnosis of SARS-CoV-2 by FDA under an Emergency Use Authorization (EUA).  This EUA will remain in effect (meaning this test can be used) for the duration of the COVID-19 declaration under Section 564(b)(1) of the Act, 21 U.S.C. section 360bbb-3(b)(1), unless the authorization is terminated or revoked sooner.  Performed at [facility], [address], [place], [place] [address]    *Note: Due to a large number of results and/or encounters for the requested time period, some results have not been displayed. A complete set of results can be found in Results Review.    Labs: CBC: Recent Labs  Lab 11/20/22 0939 11/22/22 1914 11/23/22 0426 11/24/22 0424  WBC 6.4 6.9 8.1 7.4  NEUTROABS 5.2 4.7 6.2  --   HGB 12.8 11.9* 11.8* 9.7*  HCT 35.8* 33.0* 33.2* 27.6*  MCV 89.3 88.0 88.5 90.8  PLT 157 169 167 153   Basic Metabolic Panel: Recent Labs  Lab 11/20/22 0939 11/22/22 1914 11/23/22 0426 11/24/22 0424 11/25/22 0504  NA 127* 126* 130* 128* 132*  K 3.9 4.0 3.6  3.8 3.8  CL 94* 93* 96* 97* 102  CO2 21* 23 21* 25 24  GLUCOSE 125* 101* 94 96 89  BUN 11 11 9 14 12   CREATININE 0.71 0.58 0.62 0.71 0.62  CALCIUM 9.1 9.0 8.5* 8.0* 8.0*  MG  --   --  1.8 1.8 2.2   Liver Function Tests: Recent Labs  Lab 11/20/22 0939 11/23/22 0426  AST 24 39  ALT 21 32  ALKPHOS 54 67  BILITOT 0.9 1.2  PROT 6.7 6.1*  ALBUMIN 3.8 3.4*   CBG: Recent Labs  Lab 11/20/22 0917  GLUCAP 137*    Discharge time spent: greater than 30 minutes.  Signed: Catarina Hartshorn, MD [facility] 11/25/2022

## 2022-11-26 LAB — CULTURE, BLOOD (ROUTINE X 2): Culture: NO GROWTH

## 2022-11-27 LAB — CULTURE, BLOOD (ROUTINE X 2): Culture: NO GROWTH

## 2022-11-28 ENCOUNTER — Encounter: Payer: Self-pay | Admitting: Adult Health

## 2022-11-28 ENCOUNTER — Encounter: Payer: Self-pay | Admitting: Family Medicine

## 2022-11-28 ENCOUNTER — Non-Acute Institutional Stay (SKILLED_NURSING_FACILITY): Payer: Medicare Other | Admitting: Adult Health

## 2022-11-28 ENCOUNTER — Other Ambulatory Visit: Payer: Self-pay | Admitting: Family Medicine

## 2022-11-28 DIAGNOSIS — N3000 Acute cystitis without hematuria: Secondary | ICD-10-CM | POA: Diagnosis not present

## 2022-11-28 DIAGNOSIS — E041 Nontoxic single thyroid nodule: Secondary | ICD-10-CM

## 2022-11-28 DIAGNOSIS — G8111 Spastic hemiplegia affecting right dominant side: Secondary | ICD-10-CM | POA: Diagnosis not present

## 2022-11-28 DIAGNOSIS — D696 Thrombocytopenia, unspecified: Secondary | ICD-10-CM

## 2022-11-28 DIAGNOSIS — I700 Atherosclerosis of aorta: Secondary | ICD-10-CM | POA: Insufficient documentation

## 2022-11-28 DIAGNOSIS — M797 Fibromyalgia: Secondary | ICD-10-CM | POA: Diagnosis not present

## 2022-11-28 DIAGNOSIS — K5909 Other constipation: Secondary | ICD-10-CM | POA: Diagnosis not present

## 2022-11-28 DIAGNOSIS — E43 Unspecified severe protein-calorie malnutrition: Secondary | ICD-10-CM

## 2022-11-28 DIAGNOSIS — E785 Hyperlipidemia, unspecified: Secondary | ICD-10-CM

## 2022-11-28 DIAGNOSIS — Z8673 Personal history of transient ischemic attack (TIA), and cerebral infarction without residual deficits: Secondary | ICD-10-CM | POA: Diagnosis not present

## 2022-11-28 DIAGNOSIS — K219 Gastro-esophageal reflux disease without esophagitis: Secondary | ICD-10-CM

## 2022-11-28 DIAGNOSIS — I10 Essential (primary) hypertension: Secondary | ICD-10-CM

## 2022-11-28 DIAGNOSIS — G9341 Metabolic encephalopathy: Secondary | ICD-10-CM | POA: Diagnosis not present

## 2022-11-28 DIAGNOSIS — R627 Adult failure to thrive: Secondary | ICD-10-CM

## 2022-11-28 DIAGNOSIS — R42 Dizziness and giddiness: Secondary | ICD-10-CM

## 2022-11-28 DIAGNOSIS — I2510 Atherosclerotic heart disease of native coronary artery without angina pectoris: Secondary | ICD-10-CM

## 2022-11-28 DIAGNOSIS — M24574 Contracture, right foot: Secondary | ICD-10-CM

## 2022-11-28 DIAGNOSIS — J3089 Other allergic rhinitis: Secondary | ICD-10-CM | POA: Insufficient documentation

## 2022-11-28 NOTE — Telephone Encounter (Signed)
Abruptly stopping alprazolam could cause withdrawal symptoms (irritability, anxiety, poor sleep, tremulous) for a few days. Stopping tizanidine should not cause any problem. The lisinopril would be needed if her blood pressure has remained elevated, otherwise okay to quit cold [place].  Hope things get worked out!

## 2022-11-28 NOTE — Progress Notes (Signed)
Location:  [facility] Room Number: 125 Place of Service:  SNF (31)   CODE STATUS: full code   Allergies  Allergen Reactions   Gabapentin Anxiety    Elevated heart rate and crazy dreams   Prednisone Anxiety and Other (See Comments)    REACTION: funny feeling    Chief Complaint  Patient presents with   Hospitalization Follow-up    HPI:  She is a 77 year old woman who has been hospitalized from 11-22-22 through 11-25-22. Her past medical history includes: CVA with chronic right side weakness;) with contracture at ankle wearing support device); hypertension; hyperlipidemia. She presented to the ED with increased generalized weakness; decreased oral intake and lower abdominal pain. There were no reports of fevers. She normally ambulate with a walker. She had a functional decline over the past month. She required increased assistance to even get out of bed. She denied any vomiting, diarrhea, chest pain. She had had 2 recent ED visits: 11-17-22: for a fall; with a negative work up, again on 11-20-22: due to syncopal episode; felt to be vagal. She was sent home both times.  Her CT scan demonstrates slight compression fracture at T6 age indeterminate; moderate stool burden; biapical and bibasilar scarring. She was admitted for evaluation and treatment.  Acute metabolic encephalopathy: due to dehydration; hyponatremia and UTI. She did receive IV fluids. On 11-25-22 her baseline mental status is returning.  Frequent falls and failure to thrive: needs SNF for rehab.  Questionable UTI: staph aureus: blood culture negative; did have I/O cath on 11-20-22 prior to this admission. Discusses case with Dr. Judie Petit. Thedore Mins has a past history of bladder mesh repair. Unremarkable ct of abdomen. Will need to follow out patient. Dr. Thedore Mins recommended cephalexin indefinitely until seen by urology to clarify  if a bladder/graft infection. She was seen by Dr. McDiarmid in the past; will follow up with  him.  She continues to have lower abdominal pain she states is worse when she sits down. She does not remember having surgery on her bladder.  She is here for short term rehab with her goal to return back home. 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 Dutch Quint.  Has had back injection--bp elevated after.   Chronic pain syndrome    Chronic renal insufficiency, stage 2 (mild)    GFR 60-70   DDD (degenerative disc disease), cervical    Hx of ACDF (Dr. Jordan Likes).  Followed by Dr. Charlett Blake.  Also, Dr. Vickki Hearing do left C7-T1 intralaminal epidural injection.   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  Resid R arm and leg weakness + flexion contractures.   Hypercholesteremia    mild; pt declined statin trial 05/2014--needs recheck lipid panel at first f/u visit in 2017   Hypertension    LBBB (left bundle branch block)    Osteopenia 06/2017   T  score -1.4 FRAX 15% / 2%   Ovarian cyst 2017   82019-robotic assisted bilatel SPO: all path benign.   Perianal dermatitis    prn cutivate   Phlebitis    Right hemiplegia (HCC) 01/2020   R arm and leg (hemorrhagic L cerebral hemisphere CVA)   Right knee injury 2018   Patellofemoral crush injury--Dr. Charlett Blake.   Solitary thyroid nodule 05/2021   needs bx->plan endo ref   Stroke [name])    Syncope 10/2021   cardiac w/u as per Dr. Mayford Knife   Syncope    2023 (vasovagal suspected)    Trochanteric bursitis of right hip    Recurrent (injection by Dr. Charlett Blake 05/26/15)   Tympanic membrane rupture, right 12/01/2016   As of 08/2018 pt set for tympanomastoidectomy and STSG (Dr. Ignacia Felling). Recurrent fungal/bact OE.   Varicose vein    left leg     Past Surgical History:  Procedure Laterality Date   ABDOMINAL HYSTERECTOMY  03/2015   TAH.  Last pap 2016.  No hx of abnl paps.  Per GYN, no further pap smears indicated.   ANTERIOR AND POSTERIOR REPAIR N/A 03/18/2014   Procedure: Cystocele repair with graft, Vault suspension, Rectocele repair;  Surgeon: Martina Sinner, MD;  Location: [facility];  Service: Urology;  Laterality: N/A;   BIOPSY THYROID  06/2021   benign   CHOLECYSTECTOMY     CHOLECYSTECTOMY, LAPAROSCOPIC     COLON SURGERY     COLONOSCOPY  04/2006; 05/26/16   2007 (Dr. Jarold Motto): Normal.  04/2016 (Dr. Leone Payor) normal except diverticulosis and decreased anal sphincter tone.  No repeat colonoscopy is recommended due to age.   cspine surgery     Dr. Estelle Grumbles level ant cerv discectomy Lavenia Atlas w/plating   CYSTO N/A 03/18/2014   Procedure: CYSTO;  Surgeon: Martina Sinner, MD;  Location: [facility];  Service: Urology;  Laterality: N/A;   DEXA  07/30/2015; 06/2018   2017 and 2020 -->Osteopenia--repeat 2 yrs. T score -2.29 Mar 2021. Rpt 2 yrs.   LYSIS OF ADHESION N/A 01/31/2018   Procedure: POSSIBLE LYSIS OF ADHESION;  Surgeon: Adolphus Birchwood, MD;  Location: [facility];  Service: Gynecology;  Laterality: N/A;   RECTOCELE REPAIR     with prolasped bledder repair   RESECTION OF COLON     BENIGN TUMOR   RIGHT EAR SURGERY  08/23/2017   Dr. 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  Procedure: XI ROBOTIC ASSISTED BILATERAL SALPINGO OOPHORECTOMY;  Surgeon: Adolphus Birchwood, MD;  Location: [facility];  Service: Gynecology;  Laterality: Bilateral;   TRANSTHORACIC ECHOCARDIOGRAM  01/27/2020   2021 EF 65-70%, NORMAL. 12/01/21 grd I DD, o/w normal.   TUBAL LIGATION      Social History   Socioeconomic History   Marital status: Widowed    Spouse name: Iona Beard   Number of children: 3   Years of education: Not on file   Highest education level: 8th grade  Occupational History   Occupation: retired  Tobacco Use   Smoking status: Never   Smokeless tobacco: Never  Vaping  Use   Vaping Use: Never used  Substance and Sexual Activity   Alcohol use: No    Alcohol/week: 0.0 standard drinks of alcohol   Drug use: No   Sexual activity: Not Currently    Partners: Male    Birth control/protection: Surgical    Comment: hysterectomy  Other Topics Concern   Not on file  Social History Narrative   Widow (2022), has 3 children (one lives near her).  Has 8 grandchildren, 2 greatgrandchildren.  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  No alcohol.  No drugs.Exercise: clean, work in yard.  10 siblings in all--5 have passed away--1 child age 108 with whooping cough , 1 lupus, 1 melanoma,1 lung cancer, 1 pulm fibrosis   Social Determinants of Health   Financial Resource Strain: Low Risk  (07/20/2022)   Overall Financial Resource Strain (CARDIA)    Difficulty of Paying Living Expenses: Not hard at all  Food Insecurity: No Food Insecurity (11/22/2022)   Hunger Vital Sign    Worried About Running Out of Food in the Last Year: Never true    Ran Out of Food in the Last Year: Never true  Transportation Needs: No Transportation Needs (11/22/2022)    PRAPARE - Administrator, Civil Service (Medical): No    Lack of Transportation (Non-Medical): No  Physical Activity: Sufficiently Active (07/20/2022)   Exercise Vital Sign    Days of Exercise per Week: 7 days    Minutes of Exercise per Session: 60 min  Stress: No Stress Concern Present (07/20/2022)   [employer] of Occupational Health - Occupational Stress Questionnaire    Feeling of Stress : Only a little  Social Connections: Moderately Isolated (07/20/2022)   Social Connection and Isolation Panel [NHANES]    Frequency of Communication with Friends and Family: More than three times a week    Frequency of Social Gatherings with Friends and Family: More than three times a week    Attends Religious Services: 1 to 4 times per year    Active Member of [employer] or Organizations: No    Attends Banker Meetings: Never    Marital Status: Widowed  Intimate Partner Violence: Not At Risk (11/22/2022)   Humiliation, Afraid, Rape, and Kick questionnaire    Fear of Current or Ex-Partner: No    Emotionally Abused: No    Physically Abused: No    Sexually Abused: No   Family History  Problem Relation Age of Onset   Thyroid disease Mother    Hypertension Mother    Diverticulitis Mother    Stomach cancer Father    Heart attack Father    Heart failure Father    Breast cancer Sister 103   Lupus Sister    Melanoma Brother    Prostate cancer Brother    Leukemia Brother    Lung disease Brother    Other Other        Family member with MGUS      VITAL SIGNS BP (!) 140/82   Pulse 96   Temp 98.7 F (37.1 C)   Resp 20   Ht 5' (1.524 m)   Wt 115 lb (52.2 kg)   LMP 06/27/1972 Comment: not sexually active  SpO2 95%   BMI 22.46 kg/m   Outpatient Encounter Medications as of 11/28/2022  Medication Sig   fluticasone (FLONASE) 50 MCG/ACT nasal spray Place 1 spray into both nostrils daily.   meclizine (ANTIVERT) 12.5 MG  tablet Take 12.5 mg by mouth 2 (two) times daily  as needed for dizziness.   pantoprazole (PROTONIX) 40 MG tablet Take 40 mg by mouth daily.   acetaminophen (TYLENOL) 500 MG tablet Take 1 tablet (500 mg total) by mouth 3 (three) times daily.   ALPRAZolam (XANAX) 0.5 MG tablet Take 1 tablet (0.5 mg total) by mouth at bedtime as needed.   aspirin EC 81 MG tablet Take 1 tablet (81 mg total) by mouth daily. Swallow whole.   Biotin 5 MG TABS Take 5 mg by mouth daily.   cephALEXin (KEFLEX) 500 MG capsule Take 1 capsule (500 mg total) by mouth every 8 (eight) hours. Continue indefinitely until patient follows up with urology as outpatient   clobetasol (TEMOVATE) 0.05 % external solution Apply topically.   feeding supplement (ENSURE ENLIVE / ENSURE PLUS) LIQD Take 237 mLs by mouth 2 (two) times daily between meals.   lisinopril (ZESTRIL) 10 MG tablet Take 1 tablet (10 mg total) by mouth daily. MUST KEEP APPT FOR FURTHER REFILLS   loratadine (CLARITIN) 10 MG tablet Take 10 mg by mouth every other day.    Multiple Minerals-Vitamins (CALCIUM-MAGNESIUM-ZINC-D3 PO) Take 1 tablet by mouth daily.    Multiple Vitamin (MULTIVITAMIN WITH MINERALS) TABS tablet Take 1 tablet by mouth daily.   polyethylene glycol (MIRALAX / GLYCOLAX) 17 g packet Take 17 g by mouth 3 (three) times daily.   Sodium Fluoride (CLINPRO 5000) 1.1 % PSTE Take 1 Application by mouth at bedtime.   verapamil (CALAN-SR) 180 MG CR tablet Take 1 tablet (180 mg total) by mouth daily.   [DISCONTINUED] fluticasone (FLONASE) 50 MCG/ACT nasal spray Place 2 sprays into both nostrils at bedtime. (Patient taking differently: Place 1 spray into both nostrils at bedtime.)   [DISCONTINUED] meclizine (ANTIVERT) 12.5 MG tablet Take 1 tablet (12.5 mg total) by mouth 3 (three) times daily. 1 tab po bid prn dizziness (Patient taking differently: Take 12.5 mg by mouth 2 (two) times daily as needed. 1 tab po bid prn dizziness)   [DISCONTINUED] pantoprazole (PROTONIX) 40 MG tablet TAKE 1 TABLET BY MOUTH EVERYDAY AT  BEDTIME (Patient taking differently: Take 40 mg by mouth at bedtime.)   No facility-administered encounter medications on file as of 11/28/2022.     SIGNIFICANT DIAGNOSTIC EXAMS  TODAY  11-22-22: ct of chest abdomen and pelvis:  Slight compression fracture through the superior endplate of T6, age indeterminate. Coronary artery disease, aortic atherosclerosis. No acute cardiopulmonary disease. No acute findings in the abdomen or pelvis. Sigmoid diverticulosis.  Moderate stool burden.  11-22-22: ct of lumbar spine:  Convex leftward scoliosis with associated advanced degenerative disc and facet disease. No acute bony abnormality.  LABS REVIEWED:   11-22-22: wbc 6.9; hgb 11.9; hct 33.0; mcv 88.0 plt 169; glucose 101; bun 11; creat 0.58; k+ 4.0; na++ 126; ca 9.0 gfr >60; urine culture: staphylococcus aureus  11-23-22: wbc 8.1; hgb 11.8; hct 33.2; mcv 88.5 plt 167; glucose 94; bun 9; creat 0.62; k+ 3.6; na++ 130; ca 8.5; gfr >50 protein 6.1 albumin 3.4; mag 1.8; vitamin B 12: 2458; folate 22.8; tsh 2.104; free t4: 1.57   Review of Systems  Constitutional:  Negative for malaise/fatigue.  Respiratory:  Negative for cough and shortness of breath.   Cardiovascular:  Negative for chest pain, palpitations and leg swelling.  Gastrointestinal:  Positive for abdominal pain. Negative for constipation, diarrhea and heartburn.       Has lower abdominal pain; worsens when she sits   Musculoskeletal:  Negative for back pain, joint pain and myalgias.  Skin: Negative.   Neurological:  Negative for dizziness.  Psychiatric/Behavioral:  The patient is not nervous/anxious.     Physical Exam Constitutional:      General: She is not in acute distress.    Appearance: She is underweight. She is not diaphoretic.  HENT:     Nose: Nose normal.     Mouth/Throat:     Mouth: Mucous membranes are moist.     Pharynx: Oropharynx is clear.  Eyes:     Conjunctiva/sclera: Conjunctivae normal.  Neck:     Thyroid:  Thyroid mass present. No thyromegaly.     Comments: Right thyroid nodule Cardiovascular:     Rate and Rhythm: Normal rate and regular rhythm.     Pulses: Normal pulses.     Heart sounds: Normal heart sounds.  Pulmonary:     Effort: Pulmonary effort is normal. No respiratory distress.     Breath sounds: Normal breath sounds.  Abdominal:     General: Bowel sounds are normal. There is no distension.     Palpations: Abdomen is soft.     Tenderness: There is no abdominal tenderness.  Musculoskeletal:     Cervical back: Neck supple.     Right lower leg: No edema.     Left lower leg: No edema.     Comments: Right side weakness present; has right ankle brace in place   Lymphadenopathy:     Cervical: No cervical adenopathy.  Skin:    General: Skin is warm and dry.  Neurological:     Mental Status: She is alert. She is disoriented.  Psychiatric:        Mood and Affect: Mood normal.     ASSESSMENT/ PLAN:  TODAY  Acute cystitis without hematuria: MSSA: history of mesh placement: will continue keflex 500 mg every 8 hours until seen by urology.   2. Acute metabolic encephalopathy/failure to thrive in adult: will continue therapy to improve upon her independence with her adls.   3. 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 Gastroesophageal reflux disease without esophagitis: will continue protonix 40 mg nightly   5. Chronic constipation: will continue miralax three times daily   6. Thyroid nodule: status post biopsy; tsh 2.104 free t4: 1.57  7. Thrombocytopenia, unspecified: plt 167 will monitor   8.  Hyperlipidemia: ldl goal <100: is presently not on medication will monitor   9. Chronic non-seasonal allergic rhinitis: will continue claritin 10 mg every other day; flonase  daily   10. Fibromyalgia: is on tylenol 500 mg three times daily   11. Protein calorie malnutrition, severe: albumin 3.4 will continue supplements as directed  12. Primary hypertension: b/p 140/82: will continue lisinopril 10 mg daily and verapamil ER 180 mg daily   13. Chronic vertigo: will continue meclizine 12.5 mg twice daily as needed  14. Aortic atherosclerosis (ct 11-22-22) is on asa 81 mg daily   15. 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 Chronic anxiety: has xanax 0.5 mg nightly as needed   Will check cbc bmp   Synthia Innocent NP [facility]  call [phone]

## 2022-11-29 LAB — CULTURE, BLOOD (ROUTINE X 2): Special Requests: ADEQUATE

## 2022-11-30 ENCOUNTER — Non-Acute Institutional Stay (SKILLED_NURSING_FACILITY): Payer: Medicare Other | Admitting: Internal Medicine

## 2022-11-30 ENCOUNTER — Encounter: Payer: Self-pay | Admitting: Internal Medicine

## 2022-11-30 DIAGNOSIS — R627 Adult failure to thrive: Secondary | ICD-10-CM | POA: Diagnosis not present

## 2022-11-30 DIAGNOSIS — N3000 Acute cystitis without hematuria: Secondary | ICD-10-CM | POA: Diagnosis not present

## 2022-11-30 DIAGNOSIS — G9341 Metabolic encephalopathy: Secondary | ICD-10-CM

## 2022-11-30 DIAGNOSIS — E871 Hypo-osmolality and hyponatremia: Secondary | ICD-10-CM

## 2022-11-30 DIAGNOSIS — E43 Unspecified severe protein-calorie malnutrition: Secondary | ICD-10-CM

## 2022-11-30 NOTE — Assessment & Plan Note (Addendum)
S/P rehydration and correction of hyponatremia and antibiotic treatment of antibiotics ,family stated her mental status was at baseline as of 5/31. Today she is oriented & provides an excellent history.

## 2022-11-30 NOTE — Patient Instructions (Signed)
See assessment and plan under each diagnosis in the problem list and acutely for this visit 

## 2022-11-30 NOTE — Assessment & Plan Note (Addendum)
While hospitalized sodium ranged from a low of 126 up to a final value of 132 after parenteral repletion.   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

## 2022-11-30 NOTE — Assessment & Plan Note (Addendum)
She describing intermittent pelvic pain when standing to be weighed or with certain position changes.  This suggests that the bladder mesh may have been partially displaced. Urology follow-up with Dr. McDiarmid scheduled 12/21/2022.  Earlier appointment will be requested.  Copy of this H&P to be sent to him.

## 2022-11-30 NOTE — Assessment & Plan Note (Signed)
PT/OT at SNF as tolerated.  Nutritionist will consult.

## 2022-11-30 NOTE — Progress Notes (Signed)
NURSING HOME LOCATION:  [facility] ROOM NUMBER:  125P  CODE STATUS:  Full Code  PCP:  Earley Favor MD  This is a comprehensive admission note to this SNFperformed on this date less than 30 days from date of admission. Included are preadmission medical/surgical history; reconciled medication list; family history; social history and comprehensive review of systems.  Corrections and additions to the records were documented. Comprehensive physical exam was also performed. Additionally a clinical summary was entered for each active diagnosis pertinent to this admission in the Problem List to enhance continuity of care.  HPI: She was hospitalized 5/28 - 11/25/2022 presenting with generalized weakness, decreased oral intake, and lower abdominal pain.  Clinically encephalopathy was present resulting in obtaining definitive history; this was completed employing the medical record and with information from the patient's son. Her son describes functional and progressive decline over the past month PTA with decreasing oral intake.  The patient had required increasing help with ADLs including mobilization from bed.  Prior baseline had been ambulation with a walker. The patient had actually had 2 ED visits within the week PTA.  The first was on 5/23 after a fall.  Imaging was negative for fractures. She returned to the ED 5/26 secondary to syncope.  This was felt to be vagal in etiology.  Again CT of the CNS was negative and she was discharged home.   On 5/28 in the ED low-grade fever of 99 was present. Hyponatremia was present with a sodium of 126.  CT of the chest/abdomen/pelvis revealed slight compression fracture of T6, age-indeterminate.  Moderate stool burden was present. Acute metabolic encephalopathy was attributed to dehydration, hyponatremia, and UTI.  Culture revealed Staph aureus and ceftriaxone was transitioned to vancomycin.  Blood cultures were negative.  The possible Staph  aureus UTI was in the context of having had in & out cath on 5/26.  By history she has a pelvic mesh for bladder prolapse. Dr Thedore Mins, ID, recommended continuing cephalexin indefinitely until Urology follow-up with Dr McDiarmid to rule out bladder/graft infection. Hyponatremia has been present since at least March 2022 as per Epic labs .Fluids & electrolytes were repleted parenterally.  Serially the sodium has ranged from the low of 126 up to high of 134; final value at discharge was 132. While hospitalized there was progression of anemia.  On 5/26 at the second ED visit H/H was 12.8/35.8.  There was slow progression with final values of 9.7/27.6.  Indices were normochromic, normocytic. Albumin was 3.4 & total protein 6.1 suggesting protein/caloric malnutrition. Her family felt that her mental status had returned to baseline as of 5/31. Despite the adult failure to thrive syndrome B12, folate, and TSH were normal or therapeutic.  Because of her frequent falls and debility; SNF placement for rehab was recommended.  Past medical and surgical history: Includes history of stroke with residual right hemiplegia, essential hypertension, history of chronic pain syndrome, history of diverticulosis, history of cholelithiasis, GERD,& history of thyroid nodule. Significant surgeries and procedures include hemicolectomy for diverticulitis with abscess and cholecystectomy.  Social history: Non-smoker, nondrinker.  Family history: Noncontributory due to advanced age.   Review of systems: She is alert and an excellent historian.  She realizes that she had a urinary tract infection.  She states that she continues to have intermittent pelvic pain , occurring when standing or changing her position.  She states that at the first ED visit they had performed an in & out cath which "took a while  and hurt." She states that she had fallen prior to the first ED visit because her walker had become unstable trying to navigate the  threshold of her porch.  The second ED visit was prompted by low blood pressure.  Her son measured her blood pressure as 55/40.  She states that this was associated with her being "loopy."  She does validate having vagal issues for which she has seen a Neurologist. She describes some intermittent dysphagia. She has stool urgency.  Constitutional: No fever, significant weight change  Eyes: No redness, discharge, pain, vision change ENT/mouth: No nasal congestion, purulent discharge, earache, change in hearing, sore throat  Cardiovascular: No chest pain, palpitations, paroxysmal nocturnal dyspnea, claudication, edema  Respiratory: No cough, sputum production, hemoptysis, DOE, significant snoring, apnea Gastrointestinal: No heartburn, nausea /vomiting, rectal bleeding, melena Genitourinary: No dysuria, hematuria, pyuria, incontinence, nocturia Musculoskeletal: No joint stiffness, joint swelling, weakness, pain Dermatologic: No rash, pruritus, change in appearance of skin Neurologic: No dizziness, headache, seizures Psychiatric: No significant anxiety, depression, insomnia Endocrine: No change in hair/skin/nails, excessive thirst, excessive hunger, excessive urination  Hematologic/lymphatic: No significant bruising, lymphadenopathy, abnormal bleeding Allergy/immunology: No itchy/watery eyes, significant sneezing, urticaria, angioedema  Physical exam:  Pertinent or positive findings: She appears her age and somewhat suboptimally nourished.  Hair is thin and disheveled.  Eyebrows are decreased in density.  There is ventral furrowing of the anterior forehead  & the right eyebrow sits higher than the left.  Dental hygiene is excellent.  Bronchovesicular quality of the breath sounds noted.  Pedal pulses are decreased except for the right dorsalis pedis pulse.  The right lower extremity is in a brace.  The left upper and left lower extremities are stronger to opposition than the right.  She has marked  interosseous wasting.  General appearance: no acute distress, increased work of breathing is present.   Lymphatic: No lymphadenopathy about the head, neck, axilla. Eyes: No conjunctival inflammation or lid edema is present. There is no scleral icterus. Ears:  External ear exam shows no significant lesions or deformities.   Nose:  External nasal examination shows no deformity or inflammation. Nasal mucosa are pink and moist without lesions, exudates Oral exam: Lips and gums are healthy appearing.There is no oropharyngeal erythema or exudate. Neck:  No thyromegaly, masses, tenderness noted.    Heart:  Normal rate and regular rhythm. S1 and S2 normal without gallop, murmur, click, rub.  Lungs:  without wheezes, rhonchi, rales, rubs. Abdomen: Bowel sounds are normal.  Abdomen is soft and nontender with no organomegaly, hernias, masses. GU: Deferred  Extremities:  No cyanosis, clubbing, edema. Neurologic exam:  Balance, Rhomberg, finger to nose testing could not be completed due to clinical state Skin: Warm & dry w/o tenting. No significant lesions or rash.  See clinical summary under each active problem in the Problem List with associated updated therapeutic plan

## 2022-12-02 ENCOUNTER — Other Ambulatory Visit: Payer: Self-pay | Admitting: Adult Health

## 2022-12-02 DIAGNOSIS — N3020 Other chronic cystitis without hematuria: Secondary | ICD-10-CM | POA: Diagnosis not present

## 2022-12-02 DIAGNOSIS — R3914 Feeling of incomplete bladder emptying: Secondary | ICD-10-CM | POA: Diagnosis not present

## 2022-12-02 MED ORDER — ALPRAZOLAM 0.5 MG PO TABS: 0.5000 mg | ORAL_TABLET | Freq: Every evening | ORAL | 0 refills | Status: DC | PRN

## 2022-12-02 NOTE — Assessment & Plan Note (Signed)
Current albumin 3.4 & total protein 6.1. Marked interosseous wasting on exam. Nutritionist to consult @ SNF.

## 2022-12-05 ENCOUNTER — Non-Acute Institutional Stay (SKILLED_NURSING_FACILITY): Payer: Medicare Other | Admitting: Adult Health

## 2022-12-05 ENCOUNTER — Encounter: Payer: Self-pay | Admitting: Physical Medicine & Rehabilitation

## 2022-12-05 ENCOUNTER — Other Ambulatory Visit: Payer: Self-pay | Admitting: *Deleted

## 2022-12-05 ENCOUNTER — Other Ambulatory Visit (HOSPITAL_COMMUNITY)
Admission: RE | Admit: 2022-12-05 | Discharge: 2022-12-05 | Disposition: A | Discharge status: Home / Self Care | Payer: Medicare Other | Source: Skilled Nursing Facility | Attending: Adult Health | Admitting: Adult Health

## 2022-12-05 DIAGNOSIS — E871 Hypo-osmolality and hyponatremia: Secondary | ICD-10-CM | POA: Diagnosis not present

## 2022-12-05 DIAGNOSIS — I10 Essential (primary) hypertension: Secondary | ICD-10-CM | POA: Insufficient documentation

## 2022-12-05 LAB — CBC
HCT: 31.9 % — ABNORMAL LOW (ref 36.0–46.0)
Hemoglobin: 11.2 g/dL — ABNORMAL LOW (ref 12.0–15.0)
MCH: 32.6 pg (ref 26.0–34.0)
MCHC: 35.1 g/dL (ref 30.0–36.0)
MCV: 92.7 fL (ref 80.0–100.0)
Platelets: 271 10*3/uL (ref 150–400)
RBC: 3.44 MIL/uL — ABNORMAL LOW (ref 3.87–5.11)
RDW: 13.5 % (ref 11.5–15.5)
WBC: 4.5 10*3/uL (ref 4.0–10.5)
nRBC: 0 % (ref 0.0–0.2)

## 2022-12-05 LAB — BASIC METABOLIC PANEL
Anion gap: 8 (ref 5–15)
BUN: 19 mg/dL (ref 8–23)
CO2: 22 mmol/L (ref 22–32)
Calcium: 8.7 mg/dL — ABNORMAL LOW (ref 8.9–10.3)
Chloride: 99 mmol/L (ref 98–111)
Creatinine, Ser: 0.64 mg/dL (ref 0.44–1.00)
GFR, Estimated: >60 mL/min (ref >60)
Glucose, Bld: 82 mg/dL (ref 70–99)
Potassium: 3.7 mmol/L (ref 3.5–5.1)
Sodium: 129 mmol/L — ABNORMAL LOW (ref 135–145)

## 2022-12-05 NOTE — Patient Outreach (Signed)
Mrs. Linsley resides in [facility] skilled nursing facility. Admitted under [facility] SN[facility] ACO [place] waiver. Screening for potential [facility] care coordination services as a benefit of health plan and primary care provider.  Update previously received from Francis, [place] [facility] psychotherapist. Anticipated transition plan is retirement community in [place].  Will continue to follow.   Raiford Noble, MSN, RN,BSN [facility] Post Acute Care Coordinator [phone] (Direct dial)

## 2022-12-06 ENCOUNTER — Encounter: Payer: Self-pay | Admitting: Adult Health

## 2022-12-06 NOTE — Progress Notes (Signed)
Location:  [facility] Room Number: 125 Place of Service:  SNF (31)   CODE STATUS: full   Allergies  Allergen Reactions   Gabapentin Anxiety    Elevated heart rate and crazy dreams   Prednisone Anxiety and Other (See Comments)    REACTION: funny feeling    Chief Complaint  Patient presents with   Acute Visit    Follow up labs     HPI:  Her na++ is 129; which is worse from 132. There are no reports of spasticity present. Her pain is improving.   Past Medical History:  Diagnosis Date   Allergic rhinitis    maple pollen   Anxiety    Arthritis    Bowel obstruction (HCC)    Chronic low back pain    Dr. Dutch Quint.  Has had back injection--bp elevated after.   Chronic pain syndrome    Chronic renal insufficiency, stage 2 (mild)    GFR 60-70   DDD (degenerative disc disease), cervical    Hx of ACDF (Dr. Jordan Likes).  Followed by Dr. Charlett Blake.  Also, Dr. Vickki Hearing do left C7-T1 intralaminal epidural injection.   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  Resid R arm and leg weakness + flexion contractures.   Hypercholesteremia    mild; pt declined statin trial 05/2014--needs recheck lipid panel at first f/u visit in 2017   Hypertension    LBBB (left bundle branch block)    Osteopenia 06/2017   T score -1.4 FRAX 15% / 2%   Ovarian cyst 2017   82019-robotic assisted bilatel SPO: all path benign.   Perianal dermatitis    prn cutivate   Phlebitis    Right hemiplegia (HCC) 01/2020   R arm and leg (hemorrhagic L cerebral hemisphere CVA)   Right knee injury 2018   Patellofemoral crush injury--Dr. Charlett Blake.   Solitary thyroid nodule 05/2021   needs bx->plan endo ref   Stroke [facility])    Syncope 10/2021   cardiac w/u as per  Dr. Mayford Knife   Syncope    2023 (vasovagal suspected)   Trochanteric bursitis of right hip    Recurrent (injection by Dr. Charlett Blake 05/26/15)   Tympanic membrane rupture, right 12/01/2016   As of 08/2018 pt set for tympanomastoidectomy and STSG (Dr. Ignacia Felling). Recurrent fungal/bact OE.   Varicose vein    left leg     Past Surgical History:  Procedure Laterality Date   ABDOMINAL HYSTERECTOMY  03/2015   TAH.  Last pap 2016.  No hx of abnl paps.  Per GYN, no further pap smears indicated.   ANTERIOR AND POSTERIOR REPAIR N/A 03/18/2014   Procedure: Cystocele repair with graft, Vault suspension, Rectocele repair;  Surgeon: Martina Sinner, MD;  Location: [facility] ORS;  Service: Urology;  Laterality: N/A;   BIOPSY THYROID  06/2021   benign   CHOLECYSTECTOMY     CHOLECYSTECTOMY, LAPAROSCOPIC     COLON SURGERY     COLONOSCOPY  04/2006; 05/26/16   2007 (Dr. Jarold Motto): Normal.  04/2016 (Dr. Leone Payor) normal except diverticulosis and decreased anal sphincter tone.  No repeat colonoscopy is recommended due to age.   cspine surgery     Dr. Estelle Grumbles level ant cerv discectomy /fusion w/plating   CYSTO N/A 03/18/2014  Procedure: CYSTO;  Surgeon: Martina Sinner, MD;  Location: [facility];  Service: Urology;  Laterality: N/A;   DEXA  07/30/2015; 06/2018   2017 and 2020 -->Osteopenia--repeat 2 yrs. T score -2.29 Mar 2021. Rpt 2 yrs.   LYSIS OF ADHESION N/A 01/31/2018   Procedure: POSSIBLE LYSIS OF ADHESION;  Surgeon: Adolphus Birchwood, MD;  Location: [facility];  Service: Gynecology;  Laterality: N/A;   RECTOCELE REPAIR     with prolasped bledder repair   RESECTION OF COLON     BENIGN TUMOR   RIGHT EAR SURGERY  08/23/2017   Dr. 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  Procedure: XI ROBOTIC ASSISTED BILATERAL SALPINGO OOPHORECTOMY;  Surgeon: Adolphus Birchwood, MD;  Location: [facility];  Service: Gynecology;  Laterality: Bilateral;   TRANSTHORACIC ECHOCARDIOGRAM  01/27/2020   2021 EF 65-70%, NORMAL. 12/01/21 grd I DD, o/w normal.   TUBAL LIGATION      Social History   Socioeconomic History   Marital status: Widowed    Spouse name: Iona Beard   Number of children: 3   Years of education: Not on file   Highest education level: 8th grade  Occupational History   Occupation: retired  Tobacco Use   Smoking status: Never   Smokeless tobacco: Never  Vaping Use   Vaping Use: Never used  Substance and Sexual Activity   Alcohol use: No    Alcohol/week: 0.0 standard drinks of alcohol   Drug use: No   Sexual activity: Not Currently    Partners: Male    Birth control/protection: Surgical    Comment: hysterectomy  Other Topics Concern   Not on file  Social History Narrative   Widow (2022), has 3 children (one lives near her).  Has 8 grandchildren, 2 greatgrandchildren.  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  No alcohol.  No drugs.Exercise: clean, work in yard.  10 siblings in all--5 have passed away--1 child age 61 with whooping cough , 1 lupus, 1 melanoma,1 lung cancer, 1 pulm fibrosis   Social Determinants of Health   Financial Resource Strain: Low Risk  (07/20/2022)   Overall Financial Resource Strain (CARDIA)    Difficulty of Paying Living Expenses: Not hard at all  Food Insecurity: No Food Insecurity (11/22/2022)   Hunger Vital Sign    Worried About Running Out of Food in the Last Year: Never true    Ran Out of Food in the Last Year: Never true   Transportation Needs: No Transportation Needs (11/22/2022)   PRAPARE - Administrator, Civil Service (Medical): No    Lack of Transportation (Non-Medical): No  Physical Activity: Sufficiently Active (07/20/2022)   Exercise Vital Sign    Days of Exercise per Week: 7 days    Minutes of Exercise per Session: 60 min  Stress: No Stress Concern Present (07/20/2022)   [employer] of Occupational Health - Occupational Stress Questionnaire    Feeling of Stress : Only a little  Social Connections: Moderately Isolated (07/20/2022)   Social  Connection and Isolation Panel [NHANES]    Frequency of Communication with Friends and Family: More than three times a week    Frequency of Social Gatherings with Friends and Family: More than three times a week    Attends Religious Services: 1 to 4 times per year    Active Member of [employer] or Organizations: No    Attends Banker Meetings: Never    Marital Status: Widowed  Intimate Partner Violence: Not At Risk (11/22/2022)   Humiliation, Afraid, Rape, and Kick questionnaire    Fear of Current or Ex-Partner: No    Emotionally Abused: No    Physically Abused: No    Sexually Abused: No   Family History  Problem Relation Age of Onset   Thyroid disease Mother    Hypertension Mother    Diverticulitis Mother    Stomach cancer Father    Heart attack Father    Heart failure Father    Breast cancer Sister 49   Lupus Sister    Melanoma Brother    Prostate cancer Brother    Leukemia Brother    Lung disease Brother    Other Other        Family member with MGUS      VITAL SIGNS BP 127/71   Pulse 77   Temp 97.9 F (36.6 C)   Ht 5' (1.524 m)   Wt 116 lb (52.6 kg)   LMP 06/27/1972 Comment: not sexually active  BMI 22.65 kg/m   Outpatient Encounter Medications as of 12/05/2022  Medication Sig   acetaminophen (TYLENOL) 500 MG tablet Take 1 tablet (500 mg total) by mouth 3 (three) times daily.   ALPRAZolam (XANAX) 0.5 MG  tablet Take 1 tablet (0.5 mg total) by mouth at bedtime as needed.   aspirin EC 81 MG tablet Take 1 tablet (81 mg total) by mouth daily. Swallow whole.   Biotin 5 MG TABS Take 5 mg by mouth daily.   cephALEXin (KEFLEX) 500 MG capsule Take 1 capsule (500 mg total) by mouth every 8 (eight) hours. Continue indefinitely until patient follows up with urology as outpatient   clobetasol (TEMOVATE) 0.05 % external solution Apply topically.   feeding supplement (ENSURE ENLIVE / ENSURE PLUS) LIQD Take 237 mLs by mouth 2 (two) times daily between meals.   fluticasone (FLONASE) 50 MCG/ACT nasal spray Place 1 spray into both nostrils daily.   lisinopril (ZESTRIL) 10 MG tablet Take 1 tablet (10 mg total) by mouth daily. MUST KEEP APPT FOR FURTHER REFILLS   loratadine (CLARITIN) 10 MG tablet Take 10 mg by mouth every other day.    meclizine (ANTIVERT) 12.5 MG tablet Take 12.5 mg by mouth 2 (two) times daily as needed for dizziness.   meloxicam (MOBIC) 7.5 MG tablet Take 7.5 mg by mouth daily.   Multiple Minerals-Vitamins (CALCIUM-MAGNESIUM-ZINC-D3 PO) Take 1 tablet by mouth daily.    Multiple Vitamin (MULTIVITAMIN WITH MINERALS) TABS tablet Take 1 tablet by mouth daily.   pantoprazole (PROTONIX) 40 MG tablet Take 40 mg by mouth daily.   polyethylene glycol (MIRALAX / GLYCOLAX) 17 g packet Take 17 g by mouth 3 (three) times daily.   Sodium Fluoride (CLINPRO 5000) 1.1 % PSTE Take 1 Application by mouth at bedtime.   tiZANidine (ZANAFLEX) 4 MG tablet Take 4 mg by mouth every 8 (eight) hours as needed for muscle spasms.   verapamil (CALAN-SR) 180 MG CR tablet Take 1 tablet (180 mg total) by mouth daily.   No facility-administered encounter  medications on file as of 12/05/2022.     SIGNIFICANT DIAGNOSTIC EXAMS  PREVIOUS   11-22-22: ct of chest abdomen and pelvis:  Slight compression fracture through the superior endplate of T6, age indeterminate. Coronary artery disease, aortic atherosclerosis. No acute  cardiopulmonary disease. No acute findings in the abdomen or pelvis. Sigmoid diverticulosis.  Moderate stool burden.  11-22-22: ct of lumbar spine:  Convex leftward scoliosis with associated advanced degenerative disc and facet disease. No acute bony abnormality.  NO NEW EXAMS   LABS REVIEWED:   11-22-22: wbc 6.9; hgb 11.9; hct 33.0; mcv 88.0 plt 169; glucose 101; bun 11; creat 0.58; k+ 4.0; na++ 126; ca 9.0 gfr >60; urine culture: staphylococcus aureus  11-23-22: wbc 8.1; hgb 11.8; hct 33.2; mcv 88.5 plt 167; glucose 94; bun 9; creat 0.62; k+ 3.6; na++ 130; ca 8.5; gfr >50 protein 6.1 albumin 3.4; mag 1.8; vitamin B 12: 2458; folate 22.8; tsh 2.104; free t4: 1.57  TODAY  11-25-22: glucose 89; bun 12; creat 0.62; k+ 3.8; na++ 132; ca 8.0; gfr >60 12-05-22: wbc 4.5; hgb 11.2; hct 31.9; mcv 92.7 plt 271; glucose 82; bun 19; creat 0.64; k+ 3.7; na++ 129; ca 8.7; gfr >60   Review of Systems  Constitutional:  Negative for malaise/fatigue.  Respiratory:  Negative for cough and shortness of breath.   Cardiovascular:  Negative for chest pain, palpitations and leg swelling.  Gastrointestinal:  Negative for abdominal pain, constipation and heartburn.  Musculoskeletal:  Negative for back pain, joint pain and myalgias.  Skin: Negative.   Neurological:  Negative for dizziness.  Psychiatric/Behavioral:  The patient is not nervous/anxious.     Physical Exam Constitutional:      General: She is not in acute distress.    Appearance: She is well-developed. She is not diaphoretic.  Neck:     Thyroid: Thyroid mass present. No thyromegaly.     Comments: Right thyroid nodule Cardiovascular:     Rate and Rhythm: Normal rate and regular rhythm.     Pulses: Normal pulses.     Heart sounds: Normal heart sounds.  Pulmonary:     Effort: Pulmonary effort is normal. No respiratory distress.     Breath sounds: Normal breath sounds.  Abdominal:     General: Bowel sounds are normal. There is no distension.      Palpations: Abdomen is soft.     Tenderness: There is no abdominal tenderness.  Musculoskeletal:        General: Normal range of motion.     Cervical back: Neck supple.     Right lower leg: No edema.     Left lower leg: No edema.     Comments: Right side weakness present; has right ankle brace in place   Lymphadenopathy:     Cervical: No cervical adenopathy.  Skin:    General: Skin is warm and dry.  Neurological:     Mental Status: She is alert. Mental status is at baseline.  Psychiatric:        Mood and Affect: Mood normal.       ASSESSMENT/ PLAN:  TODAY  Hyponatremia: is worse at 129; will begin NACL 1 gm daily and will repeat lab on 12-12-22.    Synthia Innocent NP [facility]   call [phone]

## 2022-12-07 ENCOUNTER — Ambulatory Visit: Payer: Medicare Other | Admitting: Physical Medicine & Rehabilitation

## 2022-12-08 ENCOUNTER — Encounter: Payer: Self-pay | Admitting: Nurse Practitioner

## 2022-12-08 ENCOUNTER — Ambulatory Visit (INDEPENDENT_AMBULATORY_CARE_PROVIDER_SITE_OTHER): Payer: Medicare Other | Admitting: Nurse Practitioner

## 2022-12-08 VITALS — BP 120/60 | HR 85 | Ht 60.0 in | Wt 115.0 lb

## 2022-12-08 DIAGNOSIS — K5909 Other constipation: Secondary | ICD-10-CM

## 2022-12-08 DIAGNOSIS — K649 Unspecified hemorrhoids: Secondary | ICD-10-CM | POA: Diagnosis not present

## 2022-12-08 MED ORDER — HYDROCORTISONE ACETATE 25 MG RE SUPP: 25.0000 mg | Freq: Every day | RECTAL | 0 refills | Status: DC

## 2022-12-08 NOTE — Patient Instructions (Addendum)
Miralax- every night as needed  We have sent the following medications to your pharmacy for you to pick up at your convenience: Anusol suppositories   Recommend repeat BMP, CBC & Iron panel at the nursing facility in 1 weeks.  Due to recent changes in healthcare laws, you may see the results of your imaging and laboratory studies on MyChart before your provider has had a chance to review them.  We understand that in some cases there may be results that are confusing or concerning to you. Not all laboratory results come back in the same time frame and the provider may be waiting for multiple results in order to interpret others.  Please give [place] 48 hours in order for your provider to thoroughly review all the results before contacting the office for clarification of your results.   Thank you for trusting me with your gastrointestinal care!   Alcide Evener, CRNP

## 2022-12-08 NOTE — Progress Notes (Signed)
12/08/2022 Marin Roberts [id number] 11/27/1945   CHIEF COMPLAINT: Rectal pressure   HISTORY OF PRESENT ILLNESS: Shamyiah Kaeo is a 77 year old female with a past medical history of anxiety, hypertension, LBBB, CVA with right hemiplegia, GERD and past colon resection secondary to ? diverticular disease or ? colon obstruction/benign mass. She is known by Dr. Leone Payor.    She had several recent visits to the ED. She presented to he ED 11/17/2022 for a fall. CT of the brain and x-ray of the lumbar spine were negative. She was discharged home in stable condition.   She went to the ED on 11/20/2022 secondary to a syncopal episode which was felt to be vagal. CT of the brain was negative. She was discharged home.  She was admitted to the hospital 11/22/2022 - 11/25/2022 secondary to failure to thrive with altered mental status. In the ED, she had a temperature of 99.0 F. She was hemodynamically stable. WBC 6.9, hemoglobin 11.9, platelets 1-69,000. Sodium 126, potassium 4.0, Cr 0.58. CT of the chest abdomen pelvis showed slight compression fracture of T6, age-indeterminate, moderate stool burden and biapical and bibasilar scarring. Pelvic x-ray was negative for fracture.  She received IV fluids and antibiotics for UTI and her clinical status improved and she was discharged to the [facility] 11/25/2022..   She presents today accompanied by her friend.  No further issues with altered mental status since she was discharged from the hospital. She is concerned about having rectal pressure with suspected hemorrhoids. She has constipation or diarrhea. She passes small hard stools daily and sometimes has diarrhea 2 to 3 times.  No rectal bleeding or black stools.  She takes Miralax tid when really constipated then dropped back to once daily. She passed a small brown soft stool this morning. No blood per the rectum or black stools. She ambulates with the assistance of a walker. She drinks 3 to 4 16 oz bottles of water daily.   Her appetite is good and she tries to eat fiber in her diet such as oatmeal.  She underwent a colonoscopy 05/26/2016 which showed patent end-to-end ileocolonic anastomosis with healthy-appearing mucosa and diverticulosis throughout the entire examined colon.     Latest Ref Rng & Units 12/05/2022    5:40 AM 11/24/2022    4:24 AM 11/23/2022    4:26 AM  CBC  WBC 4.0 - 10.5 K/uL 4.5  7.4  8.1   Hemoglobin 12.0 - 15.0 g/dL 82.9  9.7  56.2   Hematocrit 36.0 - 46.0 % 31.9  27.6  33.2   Platelets 150 - 400 K/uL 271  153  167        Latest Ref Rng & Units 12/05/2022    5:40 AM 11/25/2022    5:04 AM 11/24/2022    4:24 AM  CMP  Glucose 70 - 99 mg/dL 82  89  96   BUN 8 - 23 mg/dL 19  12  14    Creatinine 0.44 - 1.00 mg/dL 1.30  8.65  7.84   Sodium 135 - 145 mmol/L 129  132  128   Potassium 3.5 - 5.1 mmol/L 3.7  3.8  3.8   Chloride 98 - 111 mmol/L 99  102  97   CO2 22 - 32 mmol/L 22  24  25    Calcium 8.9 - 10.3 mg/dL 8.7  8.0  8.0      Chest/abdominal/pelvic CT with contrast 11/22/2022:  CT CHEST FINDINGS   Cardiovascular: Heart is normal size. Aorta  normal caliber. Scattered coronary artery and aortic calcifications.   Mediastinum/Nodes: No mediastinal, hilar, or axillary adenopathy. Trachea and esophagus are unremarkable. Thyroid unremarkable.   Lungs/Pleura: Biapical scarring. Linear scarring or atelectasis in the lung bases. No confluent opacities, effusions or pneumothorax.   Musculoskeletal: Chest wall soft tissues are unremarkable. Slight compression through the superior endplate of T6 compatible with age-indeterminate compression fracture.   CT ABDOMEN PELVIS FINDINGS   Hepatobiliary: No hepatic injury or perihepatic hematoma. Prior cholecystectomy. Mild intrahepatic and extrahepatic biliary ductal dilatation likely related to patient's age and post cholecystectomy state. 1.8 cm cyst in the inferior right hepatic lobe.   Pancreas: No focal abnormality or ductal dilatation.    Spleen: No splenic injury or perisplenic hematoma.   Adrenals/Urinary Tract: No adrenal hemorrhage or renal injury identified. Bladder is unremarkable. No renal or adrenal mass. No hydronephrosis.   Stomach/Bowel: 3 cm duodenal diverticulum. Moderate stool burden throughout the colon. Sigmoid diverticulosis. No active diverticulitis. No bowel obstruction.   Vascular/Lymphatic: Aortic atherosclerosis. No evidence of aneurysm or adenopathy.   Reproductive: Prior hysterectomy.  No adnexal masses.   Other: No free fluid or free air.   Musculoskeletal: Convex leftward scoliosis in the lumbar spine. Degenerative disc and facet disease diffusely. No acute bony abnormality.   IMPRESSION: Slight compression fracture through the superior endplate of T6, age indeterminate.   Coronary artery disease, aortic atherosclerosis.   No acute cardiopulmonary disease. No acute findings in the abdomen or pelvis.   Sigmoid diverticulosis.  Moderate stool burden.    GI PROCEDURES:  Colonoscopy 05/26/2016 by Dr. Leone Payor: - Erythematous edematous skin. found on perianal exam.  - Decreased sphincter tone found on digital rectal exam.  - Patent end-to-side ileo-colonic anastomosis, characterized by healthy appearing mucosa. - Diverticulosis in the entire examined colon. - The examination was otherwise normal on direct and retroflexion views.  - No specimens collected  Past Medical History:  Diagnosis Date   Allergic rhinitis    maple pollen   Anxiety    Arthritis    Bowel obstruction (HCC)    Chronic low back pain    Dr. Dutch Quint.  Has had back injection--bp elevated after.   Chronic pain syndrome    Chronic renal insufficiency, stage 2 (mild)    GFR 60-70   DDD (degenerative disc disease), cervical    Hx of ACDF (Dr. Jordan Likes).  Followed by Dr. Charlett Blake.  Also, Dr. Vickki Hearing do left C7-T1 intralaminal epidural injection.   Diverticulosis of colon    DJD (degenerative joint  disease)    Gallstones    GERD (gastroesophageal reflux disease)    Herpes zoster 07/08/2014   History of CVA with residual deficit 01/2020   L frontoparietal hemorrhagic CVA, R MCA/PCA watershed ischemic CVA.  Resid R arm and leg weakness + flexion contractures.   Hypercholesteremia    mild; pt declined statin trial 05/2014--needs recheck lipid panel at first f/u visit in 2017   Hypertension    LBBB (left bundle branch block)    Osteopenia 06/2017   T score -1.4 FRAX 15% / 2%   Ovarian cyst 2017   [id number]-robotic assisted bilatel SPO: all path benign.   Perianal dermatitis    prn cutivate   Phlebitis    Right hemiplegia (HCC) 01/2020   R arm and leg (hemorrhagic L cerebral hemisphere CVA)   Right knee injury 2018   Patellofemoral crush injury--Dr. Charlett Blake.   Solitary thyroid nodule 05/2021   needs bx->plan endo ref   Stroke [facility])  Syncope 10/2021   cardiac w/u as per Dr. Mayford Knife   Syncope    2023 (vasovagal suspected)   Trochanteric bursitis of right hip    Recurrent (injection by Dr. Charlett Blake 05/26/15)   Tympanic membrane rupture, right 12/01/2016   As of 08/2018 pt set for tympanomastoidectomy and STSG (Dr. Ignacia Felling). Recurrent fungal/bact OE.   Varicose vein    left leg    Past Surgical History:  Procedure Laterality Date   ABDOMINAL HYSTERECTOMY  03/2015   TAH.  Last pap 2016.  No hx of abnl paps.  Per GYN, no further pap smears indicated.   ANTERIOR AND POSTERIOR REPAIR N/A 03/18/2014   Procedure: Cystocele repair with graft, Vault suspension, Rectocele repair;  Surgeon: Martina Sinner, MD;  Location: [facility];  Service: Urology;  Laterality: N/A;   BIOPSY THYROID  06/2021   benign   CHOLECYSTECTOMY     CHOLECYSTECTOMY, LAPAROSCOPIC     COLON SURGERY     COLONOSCOPY  04/2006; 05/26/16   2007 (Dr. Jarold Motto): Normal.  04/2016 (Dr. Leone Payor) normal except diverticulosis and decreased anal sphincter tone.  No repeat colonoscopy is recommended due to age.   cspine  surgery     Dr. Estelle Grumbles level ant cerv discectomy Lavenia Atlas w/plating   CYSTO N/A 03/18/2014   Procedure: CYSTO;  Surgeon: Martina Sinner, MD;  Location: [facility];  Service: Urology;  Laterality: N/A;   DEXA  07/30/2015; 06/2018   2017 and 2020 -->Osteopenia--repeat 2 yrs. T score -2.29 Mar 2021. Rpt 2 yrs.   LYSIS OF ADHESION N/A 01/31/2018   Procedure: POSSIBLE LYSIS OF ADHESION;  Surgeon: Adolphus Birchwood, MD;  Location: [facility];  Service: Gynecology;  Laterality: N/A;   RECTOCELE REPAIR     with prolasped bledder repair   RESECTION OF COLON     BENIGN TUMOR   RIGHT EAR SURGERY  08/23/2017   Dr. 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  Procedure: XI ROBOTIC ASSISTED BILATERAL SALPINGO OOPHORECTOMY;  Surgeon: Adolphus Birchwood, MD;  Location: [facility];  Service: Gynecology;  Laterality: Bilateral;   TRANSTHORACIC ECHOCARDIOGRAM  01/27/2020   2021 EF 65-70%, NORMAL. 12/01/21 grd I DD, o/w normal.   TUBAL LIGATION     Social History: Widowed. She has two sons one lives in [place] and  the other lives in [place]. Daughter lives in [place]. No alcohol use. No drug use.   Family History: Mother with history of diverticulitis with perforation. Father had stomach cancer, MI and CHF.  Brother with leukemia.  Father with melanoma.  Brother with prostate cancer.  Sister with lupus.  No known family history of colorectal cancer.  Allergies  Allergen Reactions   Gabapentin Anxiety    Elevated heart rate and crazy  dreams   Prednisone Anxiety and Other (See Comments)    REACTION: funny feeling      Outpatient Encounter Medications as of 12/08/2022  Medication Sig   acetaminophen (TYLENOL) 500 MG tablet Take 1 tablet (500 mg total) by mouth 3 (three) times daily.   ALPRAZolam (XANAX) 0.5 MG  tablet Take 1 tablet (0.5 mg total) by mouth at bedtime as needed.   aspirin EC 81 MG tablet Take 1 tablet (81 mg total) by mouth daily. Swallow whole.   Biotin 5 MG TABS Take 5 mg by mouth daily.   cephALEXin (KEFLEX) 500 MG capsule Take 1 capsule (500 mg total) by mouth every 8 (eight) hours. Continue indefinitely until patient follows up with urology as outpatient   clobetasol (TEMOVATE) 0.05 % external solution Apply topically.   feeding supplement (ENSURE ENLIVE / ENSURE PLUS) LIQD Take 237 mLs by mouth 2 (two) times daily between meals.   fluticasone (FLONASE) 50 MCG/ACT nasal spray Place 1 spray into both nostrils daily.   lisinopril (ZESTRIL) 10 MG tablet Take 1 tablet (10 mg total) by mouth daily. MUST KEEP APPT FOR FURTHER REFILLS   loratadine (CLARITIN) 10 MG tablet Take 10 mg by mouth every other day.    meclizine (ANTIVERT) 12.5 MG tablet Take 12.5 mg by mouth 2 (two) times daily as needed for dizziness.   meloxicam (MOBIC) 7.5 MG tablet Take 7.5 mg by mouth daily.   Multiple Minerals-Vitamins (CALCIUM-MAGNESIUM-ZINC-D3 PO) Take 1 tablet by mouth daily.    Multiple Vitamin (MULTIVITAMIN WITH MINERALS) TABS tablet Take 1 tablet by mouth daily.   pantoprazole (PROTONIX) 40 MG tablet Take 40 mg by mouth daily.   polyethylene glycol (MIRALAX / GLYCOLAX) 17 g packet Take 17 g by mouth 3 (three) times daily.   Sodium Fluoride (CLINPRO 5000) 1.1 % PSTE Take 1 Application by mouth at bedtime.   tiZANidine (ZANAFLEX) 4 MG tablet Take 4 mg by mouth every 8 (eight) hours as needed for muscle spasms.   verapamil (CALAN-SR) 180 MG CR tablet Take 1 tablet (180 mg total) by mouth daily.   No facility-administered  encounter medications on file as of 12/08/2022.   REVIEW OF SYSTEMS:  Gen: Denies fever, sweats or chills. No weight loss.  CV: Denies chest pain, palpitations or edema. Resp: Denies cough, shortness of breath of hemoptysis.  GI: See HPI.  No GERD symptoms. GU : Denies urinary burning, blood in urine, increased urinary frequency or incontinence. MS: Muscle weakness. Derm: Denies rash, itchiness, skin lesions or unhealing ulcers. Psych: Denies depression, anxiety, memory loss or confusion. Heme: Denies bruising, easy bleeding. 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 Endo:  Denies any problems with DM, thyroid or adrenal function.  PHYSICAL EXAM: BP 120/60   Pulse 85   Ht 5' (1.524 m)   Wt 115 lb (52.2 kg)   LMP 06/27/1972 Comment: not sexually active  BMI 22.46 kg/m   General: 77 year old female chronically ill-appearing presents in a wheelchair in no acute distress. Head: Normocephalic and atraumatic. Eyes:  Sclerae non-icteric, conjunctive pink. Ears: Normal auditory acuity. Mouth: Dentition intact. No ulcers or lesions.  Neck: Supple, no lymphadenopathy or thyromegaly.  Lungs: Clear bilaterally to auscultation without wheezes, crackles or rhonchi. Heart: Regular rate and rhythm. No murmur, rub or gallop appreciated.  Abdomen: Soft, nontender, nondistended. No masses. No hepatosplenomegaly. Normoactive bowel sounds x 4 quadrants.  Rectal: Inflamed anal hemorrhoids without active bleeding.  A small amount of soft brown stool in the rectal vault was guaiac negative. DD CMA and friend Graciella Belton at bedside at time of exam. Musculoskeletal: Symmetrical with no gross deformities. Skin: Warm and dry. No rash or lesions on visible extremities. Extremities: No edema. Neurological: Alert oriented x 4, no focal deficits.  Psychological:  Alert and cooperative. Normal mood and affect.  ASSESSMENT AND PLAN:  77 year old female with chronic constipation with rectal pressure without  rectal bleeding, likely due to inflamed anal hemorrhoids exacerbated by sedentary status, patient sits in the wheelchair most of the day.  Stool guaiac negative. -Anusol HC 25 mg suppository 1 PR nightly x 5 nights -MiraLAX at bedtime as tolerated -Change position, stand up with assistance as tolerated to relieve pressure from the buttock/anal area  Chronic anemia.  Hemoglobin 11.2.  MCV 92.7.  No overt GI bleeding.  Hyponatremia.  Sodium 129. -Recommended CBC, iron panel and BMP in 1 week at [facility]  GERD, stable -Continue pantoprazole 40 mg daily   CC:  McGowen, Maryjean Morn, MD

## 2022-12-09 ENCOUNTER — Encounter: Payer: Self-pay | Admitting: Adult Health

## 2022-12-09 ENCOUNTER — Non-Acute Institutional Stay (SKILLED_NURSING_FACILITY): Payer: Medicare Other | Admitting: Adult Health

## 2022-12-09 DIAGNOSIS — I700 Atherosclerosis of aorta: Secondary | ICD-10-CM | POA: Diagnosis not present

## 2022-12-09 DIAGNOSIS — G8111 Spastic hemiplegia affecting right dominant side: Secondary | ICD-10-CM | POA: Diagnosis not present

## 2022-12-09 DIAGNOSIS — Z8673 Personal history of transient ischemic attack (TIA), and cerebral infarction without residual deficits: Secondary | ICD-10-CM

## 2022-12-09 NOTE — Progress Notes (Signed)
Location:  [facility] Room Number: 125 Place of Service:  SNF (31)   CODE STATUS: full   Allergies  Allergen Reactions   Gabapentin Anxiety    Elevated heart rate and crazy dreams   Prednisone Anxiety and Other (See Comments)    REACTION: funny feeling    Chief Complaint  Patient presents with   Acute Visit    Care plan meeting     HPI:  We have come together for her care plan meeting. Family present. BIMS 15/15 mood: 2/30: nervous at times. Uses wheelchair with no falls. She requires mod assist with adls. She is occasionally incontinent of bladder and frequently incontinent of bowel. Dietary: setup for meals weight is 116 pounds regular diet with supplements; requires setup for meals. Therapy: ambulate 125 feet with rolling walker supervision; upper body setup lower body contact guard; transfers are supervision; stand /pivot: supervision; stairs 4 step with bilateral rails supervision; BRP supervision UJ:WJXB cognitive disorder . Activities: does attend. She continues to be followed for her chronic illnesses including: Aortic atherosclerosis   Right spastic hemiplegia History of CVA (cerebrovascular accident)  Past Medical History:  Diagnosis Date   Allergic rhinitis    maple pollen   Anxiety    Arthritis    Bowel obstruction (HCC)    Chronic low back pain    Dr. Dutch Quint.  Has had back injection--bp elevated after.   Chronic pain syndrome    Chronic renal insufficiency, stage 2 (mild)    GFR 60-70   DDD (degenerative disc disease), cervical    Hx of ACDF (Dr. Jordan Likes).  Followed by Dr. Charlett Blake.  Also, Dr. Vickki Hearing do left C7-T1 intralaminal epidural injection.   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  Resid R  arm and leg weakness + flexion contractures.   Hypercholesteremia    mild; pt declined statin trial 05/2014--needs recheck lipid panel at first f/u visit in 2017   Hypertension    LBBB (left bundle branch block)    Osteopenia 06/2017   T score -1.4 FRAX 15% / 2%   Ovarian cyst 2017   82019-robotic assisted bilatel SPO: all path benign.   Perianal dermatitis    prn cutivate   Phlebitis    Right hemiplegia (HCC) 01/2020   R arm and leg (hemorrhagic L cerebral hemisphere CVA)   Right knee injury 2018   Patellofemoral crush injury--Dr. Charlett Blake.   Solitary thyroid nodule 05/2021   needs bx->plan endo ref   Stroke [facility])    Syncope 10/2021   cardiac w/u as per Dr. Mayford Knife   Syncope    2023 (vasovagal suspected)   Trochanteric bursitis of right hip    Recurrent (injection by Dr. Charlett Blake 05/26/15)   Tympanic membrane rupture, right 12/01/2016   As of 08/2018 pt set for tympanomastoidectomy and STSG (Dr. Ignacia Felling). Recurrent fungal/bact OE.   Varicose vein    left leg     Past Surgical History:  Procedure Laterality Date   ABDOMINAL HYSTERECTOMY  03/2015   TAH.  Last pap 2016.  No hx of abnl paps.  Per GYN, no further pap smears indicated.   ANTERIOR AND POSTERIOR REPAIR N/A 03/18/2014   Procedure: Cystocele repair with graft, Vault suspension, Rectocele repair;  Surgeon: Lorin Picket  A MacDiarmid, MD;  Location: [facility];  Service: Urology;  Laterality: N/A;   BIOPSY THYROID  06/2021   benign   CHOLECYSTECTOMY     CHOLECYSTECTOMY, LAPAROSCOPIC     COLON SURGERY     COLONOSCOPY  04/2006; 05/26/16   2007 (Dr. Jarold Motto): Normal.  04/2016 (Dr. Leone Payor) normal except diverticulosis and decreased anal sphincter tone.  No repeat colonoscopy is recommended due to age.   cspine surgery     Dr. Estelle Grumbles level ant cerv discectomy Lavenia Atlas w/plating   CYSTO N/A 03/18/2014   Procedure: CYSTO;  Surgeon: Martina Sinner, MD;  Location: [facility];  Service: Urology;  Laterality: N/A;   DEXA  07/30/2015;  06/2018   2017 and 2020 -->Osteopenia--repeat 2 yrs. T score -2.29 Mar 2021. Rpt 2 yrs.   LYSIS OF ADHESION N/A 01/31/2018   Procedure: POSSIBLE LYSIS OF ADHESION;  Surgeon: Adolphus Birchwood, MD;  Location: [facility];  Service: Gynecology;  Laterality: N/A;   RECTOCELE REPAIR     with prolasped bledder repair   RESECTION OF COLON     BENIGN TUMOR   RIGHT EAR SURGERY  08/23/2017   Dr. 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  Procedure: XI ROBOTIC ASSISTED BILATERAL SALPINGO OOPHORECTOMY;  Surgeon: Adolphus Birchwood, MD;  Location: [facility];  Service: Gynecology;  Laterality: Bilateral;   TRANSTHORACIC ECHOCARDIOGRAM  01/27/2020   2021 EF 65-70%, NORMAL. 12/01/21 grd I DD, o/w normal.   TUBAL LIGATION      Social History   Socioeconomic History   Marital status: Widowed    Spouse name: Iona Beard   Number of children: 3   Years of education: Not on file   Highest education level: 8th grade  Occupational History   Occupation: retired  Tobacco Use   Smoking status: Never   Smokeless tobacco: Never  Vaping Use   Vaping Use: Never used  Substance and Sexual Activity   Alcohol use: No    Alcohol/week: 0.0 standard drinks of alcohol   Drug use: No   Sexual activity: Not Currently    Partners: Male    Birth control/protection: Surgical    Comment: hysterectomy  Other Topics Concern   Not on file  Social History Narrative   Widow (2022), has 3 children (one lives near her).  Has 8 grandchildren, 2  greatgrandchildren.  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  No alcohol.  No drugs.Exercise: clean, work in yard.  10 siblings in all--5 have passed away--1 child age 64 with whooping cough , 1 lupus, 1 melanoma,1 lung cancer, 1 pulm fibrosis   Social Determinants of Health   Financial Resource Strain: Low Risk  (07/20/2022)   Overall Financial Resource Strain (CARDIA)    Difficulty of Paying Living Expenses: Not hard at all  Food Insecurity: No Food Insecurity (11/22/2022)   Hunger Vital Sign    Worried About Running Out of Food in the Last Year: Never true    Ran Out of Food in the Last Year: Never true  Transportation Needs: No Transportation Needs (11/22/2022)   PRAPARE - Administrator, Civil Service (Medical): No    Lack of Transportation (Non-Medical): No  Physical Activity: Sufficiently Active (07/20/2022)   Exercise Vital Sign    Days of Exercise per Week: 7 days    Minutes of Exercise per Session: 60 min  Stress: No Stress Concern Present (07/20/2022)   [employer] of Occupational Health - Occupational Stress Questionnaire    Feeling of Stress : Only a little  Social Connections: Moderately Isolated (07/20/2022)   Social Connection and Isolation Panel [NHANES]    Frequency of Communication with Friends and Family: More than three times a week    Frequency of Social Gatherings with Friends and Family: More than three times a week    Attends Religious Services: 1 to 4 times per year    Active Member of [employer] or Organizations: No    Attends Banker Meetings: Never    Marital Status: Widowed  Intimate Partner Violence: Not At Risk (11/22/2022)   Humiliation, Afraid, Rape, and Kick questionnaire    Fear of Current or Ex-Partner: No    Emotionally Abused: No    Physically Abused: No    Sexually Abused: No   Family History  Problem Relation Age of Onset   Thyroid disease Mother     Hypertension Mother    Diverticulitis Mother    Stomach cancer Father    Heart attack Father    Heart failure Father    Breast cancer Sister 79   Lupus Sister    Melanoma Brother    Prostate cancer Brother    Leukemia Brother    Lung disease Brother    Other Other        Family member with MGUS      VITAL SIGNS BP (!) 101/56   Pulse 74   Temp 97.8 F (36.6 C)   Resp 16   Ht 5' (1.524 m)   Wt 116 lb (52.6 kg)   LMP 06/27/1972 Comment: not sexually active  SpO2 97%   BMI 22.65 kg/m   Outpatient Encounter Medications as of 12/09/2022  Medication Sig   acetaminophen (TYLENOL) 500 MG tablet Take 1 tablet (500 mg total) by mouth 3 (three) times daily.   ALPRAZolam (XANAX) 0.5 MG tablet Take 1 tablet (0.5 mg total) by mouth at bedtime as needed.   aspirin EC 81 MG tablet Take 1 tablet (81 mg total) by mouth daily. Swallow whole.   Biotin 5 MG TABS Take 5 mg by mouth daily.   cephALEXin (KEFLEX) 500 MG capsule Take 1 capsule (500 mg total) by mouth every 8 (eight) hours. Continue indefinitely until patient follows up with urology as outpatient   clobetasol (TEMOVATE) 0.05 % external solution Apply topically.   feeding supplement (ENSURE ENLIVE / ENSURE PLUS) LIQD Take 237 mLs by mouth 2 (two) times daily between meals.   fluticasone (FLONASE) 50 MCG/ACT nasal spray Place 1 spray into both nostrils daily.   hydrocortisone (ANUSOL-HC) 25 MG suppository Place 1 suppository (25 mg total) rectally at bedtime.   lisinopril (ZESTRIL) 10 MG tablet Take 1 tablet (10 mg total) by mouth daily. MUST KEEP APPT FOR FURTHER REFILLS   loratadine (CLARITIN) 10 MG tablet Take 10 mg by mouth every other day.    meclizine (ANTIVERT) 12.5 MG tablet Take 12.5 mg by mouth 2 (two) times daily as needed for dizziness.   meloxicam (MOBIC) 7.5 MG tablet Take 7.5 mg by mouth  daily.   Multiple Minerals-Vitamins (CALCIUM-MAGNESIUM-ZINC-D3 PO) Take 1 tablet by mouth daily.    Multiple Vitamin (MULTIVITAMIN  WITH MINERALS) TABS tablet Take 1 tablet by mouth daily.   pantoprazole (PROTONIX) 40 MG tablet Take 40 mg by mouth daily.   polyethylene glycol (MIRALAX / GLYCOLAX) 17 g packet Take 17 g by mouth 3 (three) times daily.   Sodium Fluoride (CLINPRO 5000) 1.1 % PSTE Take 1 Application by mouth at bedtime.   tamsulosin (FLOMAX) 0.4 MG CAPS capsule Take 0.4 mg by mouth daily.   tiZANidine (ZANAFLEX) 4 MG tablet Take 4 mg by mouth every 8 (eight) hours as needed for muscle spasms.   verapamil (CALAN-SR) 180 MG CR tablet Take 1 tablet (180 mg total) by mouth daily.   No facility-administered encounter medications on file as of 12/09/2022.     SIGNIFICANT DIAGNOSTIC EXAMS  PREVIOUS   11-22-22: ct of chest abdomen and pelvis:  Slight compression fracture through the superior endplate of T6, age indeterminate. Coronary artery disease, aortic atherosclerosis. No acute cardiopulmonary disease. No acute findings in the abdomen or pelvis. Sigmoid diverticulosis.  Moderate stool burden.  11-22-22: ct of lumbar spine:  Convex leftward scoliosis with associated advanced degenerative disc and facet disease. No acute bony abnormality.  NO NEW EXAMS   LABS REVIEWED:   11-22-22: wbc 6.9; hgb 11.9; hct 33.0; mcv 88.0 plt 169; glucose 101; bun 11; creat 0.58; k+ 4.0; na++ 126; ca 9.0 gfr >60; urine culture: staphylococcus aureus  11-23-22: wbc 8.1; hgb 11.8; hct 33.2; mcv 88.5 plt 167; glucose 94; bun 9; creat 0.62; k+ 3.6; na++ 130; ca 8.5; gfr >50 protein 6.1 albumin 3.4; mag 1.8; vitamin B 12: 2458; folate 22.8; tsh 2.104; free t4: 1.57 11-25-22: glucose 89; bun 12; creat 0.62; k+ 3.8; na++ 132; ca 8.0; gfr >60 12-05-22: wbc 4.5; hgb 11.2; hct 31.9; mcv 92.7 plt 271; glucose 82; bun 19; creat 0.64; k+ 3.7; na++ 129; ca 8.7; gfr >60   NO NEW LABS.   Review of Systems  Constitutional:  Negative for malaise/fatigue.  Respiratory:  Negative for cough and shortness of breath.   Cardiovascular:  Negative for  chest pain, palpitations and leg swelling.  Gastrointestinal:  Negative for abdominal pain, constipation and heartburn.  Musculoskeletal:  Negative for back pain, joint pain and myalgias.  Skin: Negative.   Neurological:  Negative for dizziness.  Psychiatric/Behavioral:  The patient is not nervous/anxious.     Physical Exam Constitutional:      General: She is not in acute distress.    Appearance: She is well-developed. She is not diaphoretic.  Neck:     Thyroid: Thyromegaly present.     Comments: Right thyroid nodule  Cardiovascular:     Rate and Rhythm: Normal rate and regular rhythm.     Pulses: Normal pulses.     Heart sounds: Normal heart sounds.  Pulmonary:     Effort: Pulmonary effort is normal. No respiratory distress.     Breath sounds: Normal breath sounds.  Abdominal:     General: Bowel sounds are normal. There is no distension.     Palpations: Abdomen is soft.     Tenderness: There is no abdominal tenderness.  Musculoskeletal:     Cervical back: Neck supple.     Right lower leg: No edema.     Left lower leg: No edema.     Comments:  Right side weakness present; has right ankle brace in place    Lymphadenopathy:  Cervical: No cervical adenopathy.  Skin:    General: Skin is warm and dry.  Neurological:     Mental Status: She is alert and oriented to person, place, and time.  Psychiatric:        Mood and Affect: Mood normal.      ASSESSMENT/ PLAN:  TODAY  Aortic atherosclerosis Right spastic hemiplegia History of CVA (cerebrovascular accident)  Will continue current medications Will continue therapy as directed Will continue current plan of care Will continue to monitor her status.  Goal will be either home or assisted living.  Possible discharge later next week.   Time spent with patient: 40 minutes: therapy; medications; plan of care.    Synthia Innocent NP [facility]   call [phone]

## 2022-12-12 ENCOUNTER — Other Ambulatory Visit (HOSPITAL_COMMUNITY)
Admission: RE | Admit: 2022-12-12 | Discharge: 2022-12-12 | Disposition: A | Discharge status: Home / Self Care | Payer: Medicare Other | Source: Skilled Nursing Facility | Attending: Adult Health | Admitting: Adult Health

## 2022-12-12 ENCOUNTER — Ambulatory Visit (HOSPITAL_COMMUNITY): Payer: Medicare Other | Admitting: Occupational Therapy

## 2022-12-12 DIAGNOSIS — I10 Essential (primary) hypertension: Secondary | ICD-10-CM | POA: Insufficient documentation

## 2022-12-12 LAB — BASIC METABOLIC PANEL
Anion gap: 8 (ref 5–15)
BUN: 17 mg/dL (ref 8–23)
CO2: 23 mmol/L (ref 22–32)
Calcium: 8.6 mg/dL — ABNORMAL LOW (ref 8.9–10.3)
Chloride: 99 mmol/L (ref 98–111)
Creatinine, Ser: 0.65 mg/dL (ref 0.44–1.00)
GFR, Estimated: >60 mL/min (ref >60)
Glucose, Bld: 84 mg/dL (ref 70–99)
Potassium: 3.7 mmol/L (ref 3.5–5.1)
Sodium: 130 mmol/L — ABNORMAL LOW (ref 135–145)

## 2022-12-15 ENCOUNTER — Other Ambulatory Visit (HOSPITAL_COMMUNITY)
Admission: RE | Admit: 2022-12-15 | Discharge: 2022-12-15 | Disposition: A | Discharge status: Home / Self Care | Payer: Medicare Other | Source: Skilled Nursing Facility | Attending: Adult Health | Admitting: Adult Health

## 2022-12-15 DIAGNOSIS — G9341 Metabolic encephalopathy: Secondary | ICD-10-CM | POA: Insufficient documentation

## 2022-12-15 LAB — CBC
HCT: 33 % — ABNORMAL LOW (ref 36.0–46.0)
Hemoglobin: 11.3 g/dL — ABNORMAL LOW (ref 12.0–15.0)
MCH: 31.8 pg (ref 26.0–34.0)
MCHC: 34.2 g/dL (ref 30.0–36.0)
MCV: 93 fL (ref 80.0–100.0)
Platelets: 169 10*3/uL (ref 150–400)
RBC: 3.55 MIL/uL — ABNORMAL LOW (ref 3.87–5.11)
RDW: 13.7 % (ref 11.5–15.5)
WBC: 4.4 10*3/uL (ref 4.0–10.5)
nRBC: 0 % (ref 0.0–0.2)

## 2022-12-15 LAB — BASIC METABOLIC PANEL
Anion gap: 10 (ref 5–15)
BUN: 22 mg/dL (ref 8–23)
CO2: 22 mmol/L (ref 22–32)
Calcium: 8.8 mg/dL — ABNORMAL LOW (ref 8.9–10.3)
Chloride: 98 mmol/L (ref 98–111)
Creatinine, Ser: 0.75 mg/dL (ref 0.44–1.00)
GFR, Estimated: >60 mL/min (ref >60)
Glucose, Bld: 78 mg/dL (ref 70–99)
Potassium: 4 mmol/L (ref 3.5–5.1)
Sodium: 130 mmol/L — ABNORMAL LOW (ref 135–145)

## 2022-12-15 LAB — IRON AND TIBC
Iron: 104 ug/dL (ref 28–170)
Saturation Ratios: 34 % — ABNORMAL HIGH (ref 10.4–31.8)
TIBC: 310 ug/dL (ref 250–450)
UIBC: 206 ug/dL

## 2022-12-19 ENCOUNTER — Non-Acute Institutional Stay (SKILLED_NURSING_FACILITY): Payer: Medicare Other | Admitting: Internal Medicine

## 2022-12-19 ENCOUNTER — Encounter: Payer: Self-pay | Admitting: Internal Medicine

## 2022-12-19 ENCOUNTER — Other Ambulatory Visit (HOSPITAL_COMMUNITY): Payer: Self-pay | Admitting: Internal Medicine

## 2022-12-19 ENCOUNTER — Ambulatory Visit (HOSPITAL_COMMUNITY)
Admission: RE | Admit: 2022-12-19 | Discharge: 2022-12-19 | Disposition: A | Discharge status: Home / Self Care | Payer: Medicare Other | Source: Ambulatory Visit | Attending: Internal Medicine | Admitting: Internal Medicine

## 2022-12-19 ENCOUNTER — Other Ambulatory Visit (HOSPITAL_COMMUNITY)
Admission: RE | Admit: 2022-12-19 | Discharge: 2022-12-19 | Disposition: A | Discharge status: Home / Self Care | Payer: Medicare Other | Source: Skilled Nursing Facility | Attending: *Deleted | Admitting: *Deleted

## 2022-12-19 ENCOUNTER — Other Ambulatory Visit: Payer: Self-pay | Admitting: *Deleted

## 2022-12-19 DIAGNOSIS — M79604 Pain in right leg: Secondary | ICD-10-CM | POA: Insufficient documentation

## 2022-12-19 DIAGNOSIS — R7989 Other specified abnormal findings of blood chemistry: Secondary | ICD-10-CM | POA: Insufficient documentation

## 2022-12-19 DIAGNOSIS — M25471 Effusion, right ankle: Secondary | ICD-10-CM | POA: Diagnosis not present

## 2022-12-19 DIAGNOSIS — R627 Adult failure to thrive: Secondary | ICD-10-CM | POA: Diagnosis not present

## 2022-12-19 DIAGNOSIS — M21371 Foot drop, right foot: Secondary | ICD-10-CM | POA: Insufficient documentation

## 2022-12-19 DIAGNOSIS — I10 Essential (primary) hypertension: Secondary | ICD-10-CM

## 2022-12-19 DIAGNOSIS — M7989 Other specified soft tissue disorders: Secondary | ICD-10-CM | POA: Diagnosis not present

## 2022-12-19 DIAGNOSIS — N3000 Acute cystitis without hematuria: Secondary | ICD-10-CM

## 2022-12-19 DIAGNOSIS — G9341 Metabolic encephalopathy: Secondary | ICD-10-CM | POA: Diagnosis not present

## 2022-12-19 LAB — D-DIMER, QUANTITATIVE: D-Dimer, Quant: 0.7 ug/mL-FEU — ABNORMAL HIGH (ref 0.00–0.50)

## 2022-12-19 LAB — URIC ACID: Uric Acid, Serum: 2.5 mg/dL (ref 2.5–7.1)

## 2022-12-19 NOTE — Assessment & Plan Note (Signed)
BP controlled; no change in antihypertensive medications  

## 2022-12-19 NOTE — Patient Outreach (Signed)
[facility] Post- Acute Care Coordinator follow up. Alison Cuevas resides [facility].   Update received from [place], [place] Child psychotherapist. Mrs. Tindall will transition to ALF tomorrow.   No identifiable care coordination needs.   Raiford Noble, MSN, RN,BSN [facility] Post Acute Care Coordinator [phone] (Direct dial)

## 2022-12-19 NOTE — Patient Instructions (Signed)
See assessment and plan under each diagnosis in the problem list and acutely for this visit She will be discharged tentatively 12/20/2022 to an ALF.

## 2022-12-19 NOTE — Progress Notes (Incomplete)
NURSING HOME LOCATION:  [facility] ROOM NUMBER:  125 P  CODE STATUS:  Full Code  PCP:  Earley Favor MD  This is a nursing facility follow up visit for tentative discharge 12/20/2022 to ALF.  Interim medical record and care since last SNF visit was updated with review of diagnostic studies and change in clinical status since last visit were documented.  HPI: The patient had been hospitalized 5/28 - 11/25/2022 with generalized weakness, decreased oral intake, and lower abdominal pain.  Definitive history could not be obtained from her due to clinical encephalopathy. According to her son there had been functional decline for at least a month PTA with decreasing oral intake.  She was also requiring increased support with ADLs even with mobilization from the bed.  Prior to that time she has been ambulating with a walker. The patient had been seen in the ED on 2 occasions within the week PTA.  Initial ED visit was 5/23 after a fall.  Imaging revealed no fractures. Second visit was 5/26 for apparent syncope, possibly vagal in etiology.  CNS imaging was negative and she was discharged home. In the ED 5/28 low-grade fever of 99 was present.  Hyponatremia was present with a sodium of 126.  CT of the chest/abdomen/pelvis revealed slight compression fracture of T6; the age was indeterminate. The acute metabolic encephalopathy was attributed to dehydration, hyponatremia, and UTI. Serum B12, serum folate, and TSH were therapeutic or normal. Culture and sensitivity revealed Staph aureus.  This was in the context of in & out catheterization on 5/26 in the ED.  Significant history includes the presence of a pelvic mesh for bladder prolapse.  ID consulted and recommended continuing cephalexin until Urology follow-up with Dr. Sherron Monday to rule out bladder/graft infection. Hyponatremia is chronic on review of labs.  Sodium has ranged from a low of 126 up to high of 134. Electrolytes were  repleted parenterally.  The final sodium prior to discharge was 132. Electrolytes were repleted parenterally.  The final sodium prior to discharge was 132. During hospitalization there was progression of anemia.  At the second ED visit 5/26 H/H was 12.8/25.8.  There was slow progression of anemia with final values of 9.7/27.6.  Indices were normochromic, normocytic. Protein/caloric malnutrition was suggested by an albumin of 3.4 total protein 6.1. Mental status returned to baseline as of 5/31 per the family; but discharge to SNF was recommended because of her debilitation and failure to thrive. Labs were monitored at the SNF.  On 6/28 sodium was 130, calcium 8.8 and GFR greater than 60.  H/H was 11.3/33 and serum iron was normal at 104. Urologic follow-up was completed 6/4; Keflex was discontinued.  Review of systems: She realizes she is being discharged to an ALF tomorrow.  She describes increased gas and "roaring" of the lower abdomen.  She has dysphagia depending on "head position" in the context of cervical rods.   For 2 weeks she has had right ankle swelling and foot pain.  This has hindered  her tieing her shoe.  The swelling decreases minimally at night when supine.  Constitutional: No fever, significant weight change, fatigue  Eyes: No redness, discharge, pain, vision change ENT/mouth: No nasal congestion,  purulent discharge, earache, change in hearing, sore throat  Cardiovascular: No chest pain, palpitations, paroxysmal nocturnal dyspnea, claudication  Respiratory: No cough, sputum production, hemoptysis, DOE, significant snoring, apnea   Gastrointestinal: No heartburn, nausea /vomiting, rectal bleeding, melena, change in bowels Genitourinary: No dysuria, hematuria,  pyuria, incontinence, nocturia Dermatologic: No rash, pruritus, change in appearance of skin Neurologic: No dizziness, headache, syncope, seizures, numbness, tingling Psychiatric: No significant anxiety, depression,  insomnia, anorexia Endocrine: No change in hair/skin/nails, excessive thirst, excessive hunger, excessive urination  Hematologic/lymphatic: No significant bruising, lymphadenopathy, abnormal bleeding Allergy/immunology: No itchy/watery eyes, significant sneezing, urticaria, angioedema  Physical exam:  Pertinent or positive findings: She is alert and oriented.  Dental hygiene is immaculate.  There is slight increase in S2.  There is a well-healed, very faint horizontal cooperative scar in the right abdomen.  Pedal pulses are decreased.  There is 1+ edema at the right ankle.  The left upper and left lower extremities are stronger to opposition than the right upper and right lower extremities.  Denna Haggard' sign is equivocal in the right calf.  General appearance: no acute distress, increased work of breathing is present.   Lymphatic: No lymphadenopathy about the head, neck, axilla. Eyes: No conjunctival inflammation or lid edema is present. There is no scleral icterus. Ears:  External ear exam shows no significant lesions or deformities.   Nose:  External nasal examination shows no deformity or inflammation. Nasal mucosa are pink and moist without lesions, exudates Oral exam:  Lips and gums are healthy appearing. There is no oropharyngeal erythema or exudate. Neck:  No thyromegaly, masses, tenderness noted.    Heart:  Normal rate and regular rhythm. S1 normal without gallop, murmur, click, rub .  Lungs: Chest clear to auscultation without wheezes, rhonchi, rales, rubs. Abdomen: Bowel sounds are normal. Abdomen is soft and nontender with no organomegaly, hernias, masses. GU: Deferred  Extremities:  No cyanosis, clubbing  Neurologic exam :Balance, Rhomberg, finger to nose testing could not be completed due to clinical state Skin: Warm & dry w/o tenting. No significant lesions or rash.  See summary under each active problem in the Problem List with associated updated therapeutic plan.  12/20/2022  Images of foot & ankle performed by [facility] revealed osteoarthritis without presence of fracture.  Hallux valgus deformity (bunion) was present.  D-dimer was 0.70, mildly elevated; but venous Doppler revealed no DVT.  Uric acid was 2.5 ruling out acute gout.  Patient informed of results.  Topical Voltaren gel over the ankle and elevation are treatment options.  Orthopedic consult if symptoms persist or progress.

## 2022-12-19 NOTE — Assessment & Plan Note (Signed)
Clinically she has advanced with PT/OT.  She will be discharged to [facility] in [place], [place] tentatively tomorrow based on results of labs and images ordered for today.

## 2022-12-19 NOTE — Assessment & Plan Note (Signed)
Portable film of ankle and foot to rule out fracture.  Uric acid and D-dimer ordered.

## 2022-12-19 NOTE — Assessment & Plan Note (Signed)
She is alert and oriented today.  Clinically there is no sign of residual encephalopathy.

## 2022-12-19 NOTE — Assessment & Plan Note (Addendum)
Urology follow-up completed 11/29/22.  Flexible sigmoidoscopy performedby Dr. Sherron Monday.  Findings include some cystitis but no evidence of any mesh erosion.  Suboptimal bladder emptying diagnosed.  Diet was constipation was thought to be contributing significantly to recurrent UTIs.  She was to work aggressively on bowel regimen to guarantee more regular bowel movements.  Tamsulosin 0.4 mg was initiated to help bladder emptying. Keflex discontinued.  Urology follow-up recommended 03/03/2023.

## 2022-12-20 ENCOUNTER — Encounter: Payer: Self-pay | Admitting: Family Medicine

## 2022-12-21 ENCOUNTER — Ambulatory Visit: Payer: Medicare Other | Admitting: Family Medicine

## 2022-12-21 DIAGNOSIS — I119 Hypertensive heart disease without heart failure: Secondary | ICD-10-CM | POA: Diagnosis not present

## 2022-12-21 DIAGNOSIS — M6281 Muscle weakness (generalized): Secondary | ICD-10-CM | POA: Diagnosis not present

## 2022-12-21 DIAGNOSIS — Z8673 Personal history of transient ischemic attack (TIA), and cerebral infarction without residual deficits: Secondary | ICD-10-CM | POA: Diagnosis not present

## 2022-12-21 DIAGNOSIS — K219 Gastro-esophageal reflux disease without esophagitis: Secondary | ICD-10-CM | POA: Diagnosis not present

## 2022-12-21 DIAGNOSIS — M21371 Foot drop, right foot: Secondary | ICD-10-CM | POA: Diagnosis not present

## 2022-12-21 DIAGNOSIS — E785 Hyperlipidemia, unspecified: Secondary | ICD-10-CM | POA: Diagnosis not present

## 2022-12-21 DIAGNOSIS — I69951 Hemiplegia and hemiparesis following unspecified cerebrovascular disease affecting right dominant side: Secondary | ICD-10-CM | POA: Diagnosis not present

## 2022-12-21 DIAGNOSIS — R269 Unspecified abnormalities of gait and mobility: Secondary | ICD-10-CM | POA: Diagnosis not present

## 2022-12-21 NOTE — Progress Notes (Deleted)
OFFICE VISIT  12/21/2022  CC: No chief complaint on file.   Patient is a 77 y.o. female who presents accompanied by for f/u HTN, FTT, chronic pain  INTERIM HX: *** She as been in [facility] facility in [place].  chronic pain syndrome: Right leg flexion contracture status post CVA.  This has reached its maximum improvement.  She will continue with AFO and periodic follow-up with Dr. Hermelinda Medicus, next appointment in January 2024.  He has stopped Botox injections because they did not help. Continue Vicodin 5-3 25, 1 every 6 hours as needed. Next DEXA due 03/2023.    PMP AWARE reviewed today: most recent rx for alprazolam 0.5 mg was filled 08/31/2022, # 90, rx by me.  Most recent hydrocodone 5/325 prescription filled 01/19/2022, #60, prescription by me. No red flags.  Past Medical History:  Diagnosis Date   Allergic rhinitis    maple pollen   Anxiety    Arthritis    Bowel obstruction (HCC)    Chronic low back pain    Dr. Dutch Quint.  Has had back injection--bp elevated after.   Chronic pain syndrome    Chronic renal insufficiency, stage 2 (mild)    GFR 60-70   DDD (degenerative disc disease), cervical    Hx of ACDF (Dr. Jordan Likes).  Followed by Dr. Charlett Blake.  Also, Dr. Vickki Hearing do left C7-T1 intralaminal epidural injection.   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  Resid R arm and leg weakness + flexion contractures.   Hypercholesteremia    mild; pt declined statin trial 05/2014--needs recheck lipid panel at first f/u visit in 2017   Hypertension    LBBB (left bundle branch block)    Osteopenia 06/2017   T score -1.4 FRAX 15% / 2%   Ovarian cyst 2017   82019-robotic assisted bilatel SPO: all path benign.   Perianal dermatitis    prn cutivate    Phlebitis    Right hemiplegia (HCC) 01/2020   R arm and leg (hemorrhagic L cerebral hemisphere CVA)   Right knee injury 2018   Patellofemoral crush injury--Dr. Charlett Blake.   Solitary thyroid nodule 05/2021   needs bx->plan endo ref   Stroke [facility])    Syncope 10/2021   cardiac w/u as per Dr. Mayford Knife   Syncope    2023 (vasovagal suspected)   Trochanteric bursitis of right hip    Recurrent (injection by Dr. Charlett Blake 05/26/15)   Tympanic membrane rupture, right 12/01/2016   As of 08/2018 pt set for tympanomastoidectomy and STSG (Dr. Ignacia Felling). Recurrent fungal/bact OE.   Varicose vein    left leg     Past Surgical History:  Procedure Laterality Date   ABDOMINAL HYSTERECTOMY  03/2015   TAH.  Last pap 2016.  No hx of abnl paps.  Per GYN, no further pap smears indicated.   ANTERIOR AND POSTERIOR REPAIR N/A 03/18/2014   Procedure: Cystocele repair with graft, Vault suspension, Rectocele repair;  Surgeon: Martina Sinner, MD;  [facility]: [facility];  Service: Urology;  Laterality: N/A;   BIOPSY THYROID  06/2021   benign   CHOLECYSTECTOMY     CHOLECYSTECTOMY, LAPAROSCOPIC     COLON SURGERY     COLONOSCOPY  04/2006; 05/26/16   2007 (Dr. Jarold Motto): Normal.  04/2016 (Dr. Leone Payor) normal  except diverticulosis and decreased anal sphincter tone.  No repeat colonoscopy is recommended due to age.   cspine surgery     Dr. Estelle Grumbles level ant cerv discectomy Lavenia Atlas w/plating   CYSTO N/A 03/18/2014   Procedure: CYSTO;  Surgeon: Martina Sinner, MD;  [facility];  Service: Urology;  Laterality: N/A;   DEXA  07/30/2015; 06/2018   2017 and 2020 -->Osteopenia--repeat 2 yrs. T score -2.29 Mar 2021. Rpt 2 yrs.   LYSIS OF ADHESION N/A 01/31/2018   Procedure: POSSIBLE LYSIS OF ADHESION;  Surgeon: Adolphus Birchwood, MD;  [facility]: [facility];  Service: Gynecology;  Laterality: N/A;   RECTOCELE REPAIR     with prolasped bledder repair   RESECTION OF COLON     BENIGN TUMOR   RIGHT EAR SURGERY  08/23/2017   Dr. 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  Procedure: XI ROBOTIC ASSISTED BILATERAL SALPINGO OOPHORECTOMY;  Surgeon: Adolphus Birchwood, MD;  [facility];  Service: Gynecology;  Laterality: Bilateral;   TRANSTHORACIC ECHOCARDIOGRAM  01/27/2020   2021 EF 65-70%, NORMAL. 12/01/21 grd I DD, o/w normal.   TUBAL LIGATION      Outpatient Medications Prior to Visit  Medication Sig Dispense Refill   acetaminophen (TYLENOL) 500 MG tablet Take 1 tablet (500 mg total) by mouth 3 (three) times daily. 30 tablet 0   ALPRAZolam (XANAX) 0.5 MG tablet Take 1 tablet (0.5 mg total) by mouth at bedtime as needed. 30 tablet 0   aspirin EC 81 MG tablet Take 1 tablet (81 mg total) by mouth daily. Swallow whole. 30 tablet 11   Biotin 5 MG TABS Take 5 mg by mouth daily.     clobetasol (TEMOVATE) 0.05 % external solution Apply topically.     feeding supplement (ENSURE ENLIVE / ENSURE PLUS) LIQD Take 237 mLs by mouth 2 (two) times daily between meals. 237 mL 12   fluticasone (FLONASE) 50 MCG/ACT nasal spray Place 1 spray into both nostrils daily.     hydrocortisone (ANUSOL-HC) 25 MG suppository Place 1 suppository (25 mg total) rectally at bedtime. 5 suppository 0   lisinopril (ZESTRIL) 10 MG tablet Take 1 tablet (10 mg total) by mouth daily. MUST KEEP APPT FOR FURTHER REFILLS 90 tablet 0   loratadine (CLARITIN) 10 MG tablet Take 10 mg by mouth every other day.      meclizine (ANTIVERT) 12.5 MG tablet Take 12.5 mg by mouth 2 (two) times daily as  needed for dizziness.     meloxicam (MOBIC) 7.5 MG tablet Take 7.5 mg by mouth daily.     Multiple Minerals-Vitamins (CALCIUM-MAGNESIUM-ZINC-D3 PO) Take 1 tablet by mouth daily.      Multiple Vitamin (MULTIVITAMIN WITH MINERALS) TABS tablet Take 1 tablet by mouth daily.     pantoprazole (PROTONIX) 40 MG tablet Take 40 mg by mouth daily.     polyethylene glycol (MIRALAX / GLYCOLAX) 17 g packet Take 17 g by mouth 3 (three) times daily.     Sodium Fluoride (CLINPRO 5000) 1.1 % PSTE Take 1 Application by mouth at bedtime.  tamsulosin (FLOMAX) 0.4 MG CAPS capsule Take 0.4 mg by mouth daily.     tiZANidine (ZANAFLEX) 4 MG tablet Take 4 mg by mouth every 8 (eight) hours as needed for muscle spasms.     verapamil (CALAN-SR) 180 MG CR tablet Take 1 tablet (180 mg total) by mouth daily. 90 tablet 3   No facility-administered medications prior to visit.    Allergies  Allergen Reactions   Gabapentin Anxiety    Elevated heart rate and crazy dreams   Prednisone Anxiety and Other (See Comments)    REACTION: funny feeling    Review of Systems As per HPI  PE:    12/19/2022   10:33 AM 12/09/2022    9:19 AM 12/08/2022   10:03 AM  Vitals with BMI  Height  5\' 0"  5\' 0"   Weight  116 lbs 115 lbs  BMI  22.65 22.46  Systolic 137 101 810  Diastolic 76 56 60  Pulse 75 74 85     Physical Exam  ***  LABS:  Last CBC Lab Results  Component Value Date   WBC 4.4 12/15/2022   HGB 11.3 (L) 12/15/2022   HCT 33.0 (L) 12/15/2022   MCV 93.0 12/15/2022   MCH 31.8 12/15/2022   RDW 13.7 12/15/2022   PLT 169 12/15/2022   Lab Results  Component Value Date   IRON 104 12/15/2022   TIBC 310 12/15/2022   FERRITIN 73 06/03/2015   Last metabolic panel Lab Results  Component Value Date   GLUCOSE 78 12/15/2022   NA 130 (L) 12/15/2022   K 4.0 12/15/2022   CL 98 12/15/2022   CO2 22 12/15/2022   BUN 22 12/15/2022   CREATININE 0.75 12/15/2022   GFRNONAA >60 12/15/2022   CALCIUM 8.8 (L) 12/15/2022    PROT 6.1 (L) 11/23/2022   ALBUMIN 3.4 (L) 11/23/2022   BILITOT 1.2 11/23/2022   ALKPHOS 67 11/23/2022   AST 39 11/23/2022   ALT 32 11/23/2022   ANIONGAP 10 12/15/2022   Last hemoglobin A1c Lab Results  Component Value Date   HGBA1C 5.0 01/28/2020   Last thyroid functions Lab Results  Component Value Date   TSH 2.104 11/23/2022   T3TOTAL 111 09/25/2019   T4TOTAL 8.1 11/21/2013   Last vitamin D Lab Results  Component Value Date   VD25OH 85.58 05/06/2021   IMPRESSION AND PLAN:  No problem-specific Assessment & Plan notes found for this encounter.   An After Visit Summary was printed and given to the patient.  FOLLOW UP: No follow-ups on file.  Signed:  Santiago Bumpers, MD           12/21/2022

## 2022-12-23 DIAGNOSIS — I10 Essential (primary) hypertension: Secondary | ICD-10-CM | POA: Diagnosis not present

## 2022-12-23 DIAGNOSIS — D649 Anemia, unspecified: Secondary | ICD-10-CM | POA: Diagnosis not present

## 2022-12-23 DIAGNOSIS — E119 Type 2 diabetes mellitus without complications: Secondary | ICD-10-CM | POA: Diagnosis not present

## 2022-12-23 DIAGNOSIS — E039 Hypothyroidism, unspecified: Secondary | ICD-10-CM | POA: Diagnosis not present

## 2022-12-23 DIAGNOSIS — E559 Vitamin D deficiency, unspecified: Secondary | ICD-10-CM | POA: Diagnosis not present

## 2022-12-23 DIAGNOSIS — D619 Aplastic anemia, unspecified: Secondary | ICD-10-CM | POA: Diagnosis not present

## 2022-12-26 ENCOUNTER — Other Ambulatory Visit: Payer: Self-pay | Admitting: *Deleted

## 2022-12-26 DIAGNOSIS — R54 Age-related physical debility: Secondary | ICD-10-CM | POA: Diagnosis not present

## 2022-12-26 DIAGNOSIS — E678 Other specified hyperalimentation: Secondary | ICD-10-CM | POA: Diagnosis not present

## 2022-12-26 DIAGNOSIS — E871 Hypo-osmolality and hyponatremia: Secondary | ICD-10-CM | POA: Diagnosis not present

## 2022-12-26 DIAGNOSIS — R1312 Dysphagia, oropharyngeal phase: Secondary | ICD-10-CM | POA: Diagnosis not present

## 2022-12-26 DIAGNOSIS — R799 Abnormal finding of blood chemistry, unspecified: Secondary | ICD-10-CM | POA: Diagnosis not present

## 2022-12-26 NOTE — Patient Outreach (Signed)
Per [facility] Alison Cuevas discharged from [facility] skilled nursing facility on 12/20/22. Screening for potential [facility] care coordination services as benefit of health plan and  Primary Care Provider.  Confirmed with Alison Cuevas Nursing social worker. Alison Cuevas transitioned to [facility] in [place].  No identifiable [facility] care coordination needs.   Raiford Noble, MSN, RN,BSN [facility] Post Acute Care Coordinator [phone] (Direct dial)

## 2022-12-27 DIAGNOSIS — R1312 Dysphagia, oropharyngeal phase: Secondary | ICD-10-CM | POA: Diagnosis not present

## 2022-12-28 DIAGNOSIS — M21371 Foot drop, right foot: Secondary | ICD-10-CM | POA: Diagnosis not present

## 2022-12-28 DIAGNOSIS — Z9181 History of falling: Secondary | ICD-10-CM | POA: Diagnosis not present

## 2022-12-28 DIAGNOSIS — M63852 Disorders of muscle in diseases classified elsewhere, left thigh: Secondary | ICD-10-CM | POA: Diagnosis not present

## 2022-12-28 DIAGNOSIS — M63861 Disorders of muscle in diseases classified elsewhere, right lower leg: Secondary | ICD-10-CM | POA: Diagnosis not present

## 2022-12-28 DIAGNOSIS — M63851 Disorders of muscle in diseases classified elsewhere, right thigh: Secondary | ICD-10-CM | POA: Diagnosis not present

## 2022-12-28 DIAGNOSIS — M63862 Disorders of muscle in diseases classified elsewhere, left lower leg: Secondary | ICD-10-CM | POA: Diagnosis not present

## 2023-01-02 DIAGNOSIS — N390 Urinary tract infection, site not specified: Secondary | ICD-10-CM | POA: Diagnosis not present

## 2023-01-03 ENCOUNTER — Encounter: Payer: Self-pay | Admitting: Physical Medicine & Rehabilitation

## 2023-01-03 DIAGNOSIS — M63861 Disorders of muscle in diseases classified elsewhere, right lower leg: Secondary | ICD-10-CM | POA: Diagnosis not present

## 2023-01-03 DIAGNOSIS — M63852 Disorders of muscle in diseases classified elsewhere, left thigh: Secondary | ICD-10-CM | POA: Diagnosis not present

## 2023-01-03 DIAGNOSIS — M63862 Disorders of muscle in diseases classified elsewhere, left lower leg: Secondary | ICD-10-CM | POA: Diagnosis not present

## 2023-01-03 DIAGNOSIS — M21371 Foot drop, right foot: Secondary | ICD-10-CM | POA: Diagnosis not present

## 2023-01-03 DIAGNOSIS — M63851 Disorders of muscle in diseases classified elsewhere, right thigh: Secondary | ICD-10-CM | POA: Diagnosis not present

## 2023-01-03 DIAGNOSIS — Z9181 History of falling: Secondary | ICD-10-CM | POA: Diagnosis not present

## 2023-01-04 ENCOUNTER — Encounter: Payer: Medicare Other | Attending: Physical Medicine & Rehabilitation | Admitting: Physical Medicine & Rehabilitation

## 2023-01-04 ENCOUNTER — Encounter: Payer: Self-pay | Admitting: Physical Medicine & Rehabilitation

## 2023-01-04 DIAGNOSIS — H6121 Impacted cerumen, right ear: Secondary | ICD-10-CM | POA: Diagnosis not present

## 2023-01-04 DIAGNOSIS — G8111 Spastic hemiplegia affecting right dominant side: Secondary | ICD-10-CM | POA: Insufficient documentation

## 2023-01-04 DIAGNOSIS — R1312 Dysphagia, oropharyngeal phase: Secondary | ICD-10-CM | POA: Diagnosis not present

## 2023-01-04 DIAGNOSIS — K5900 Constipation, unspecified: Secondary | ICD-10-CM | POA: Diagnosis not present

## 2023-01-04 DIAGNOSIS — M63861 Disorders of muscle in diseases classified elsewhere, right lower leg: Secondary | ICD-10-CM | POA: Diagnosis not present

## 2023-01-04 DIAGNOSIS — Z9181 History of falling: Secondary | ICD-10-CM | POA: Diagnosis not present

## 2023-01-04 DIAGNOSIS — M63852 Disorders of muscle in diseases classified elsewhere, left thigh: Secondary | ICD-10-CM | POA: Diagnosis not present

## 2023-01-04 DIAGNOSIS — M63862 Disorders of muscle in diseases classified elsewhere, left lower leg: Secondary | ICD-10-CM | POA: Diagnosis not present

## 2023-01-04 DIAGNOSIS — H9191 Unspecified hearing loss, right ear: Secondary | ICD-10-CM | POA: Diagnosis not present

## 2023-01-04 DIAGNOSIS — M63851 Disorders of muscle in diseases classified elsewhere, right thigh: Secondary | ICD-10-CM | POA: Diagnosis not present

## 2023-01-04 DIAGNOSIS — M21371 Foot drop, right foot: Secondary | ICD-10-CM | POA: Diagnosis not present

## 2023-01-04 MED ORDER — ABOBOTULINUMTOXINA 300 UNITS IM SOLR
300.0000 [IU] | Freq: Once | INTRAMUSCULAR | Status: AC
Start: 2023-01-04 — End: 2023-01-04
  Administered 2023-01-04: 300 [IU] via INTRAMUSCULAR

## 2023-01-04 MED ORDER — ABOBOTULINUMTOXINA 500 UNITS IM SOLR
500.0000 [IU] | Freq: Once | INTRAMUSCULAR | Status: AC
Start: 2023-01-04 — End: 2023-01-04
  Administered 2023-01-04: 500 [IU] via INTRAMUSCULAR

## 2023-01-04 NOTE — Patient Instructions (Signed)
ALWAYS FEEL FREE TO CALL OUR OFFICE WITH ANY PROBLEMS OR QUESTIONS ([phone])  **PLEASE NOTE** ALL MEDICATION REFILL REQUESTS (INCLUDING CONTROLLED SUBSTANCES) NEED TO BE MADE AT LEAST 7 DAYS PRIOR TO REFILL BEING DUE. ANY REFILL REQUESTS INSIDE THAT TIME FRAME MAY RESULT IN DELAYS IN RECEIVING YOUR PRESCRIPTION.                    

## 2023-01-04 NOTE — Progress Notes (Signed)
Subjective:    Patient ID: Alison Cuevas, female    DOB: [birth year], 77 y.o.   MRN: [id number]  HPI  Pain Inventory Average Pain 6 Pain Right Now 6 My pain is burning and aching  In the last 24 hours, has pain interfered with the following? General activity 0 Relation with others 0 Enjoyment of life 0 What TIME of day is your pain at its worst? varies Sleep (in general) Fair  Pain is worse with: walking and standing Pain improves with: medication Relief from Meds: 6  Family History  Problem Relation Age of Onset   Thyroid disease Mother    Hypertension Mother    Diverticulitis Mother    Stomach cancer Father    Heart attack Father    Heart failure Father    Breast cancer Sister 25   Lupus Sister    Melanoma Brother    Prostate cancer Brother    Leukemia Brother    Lung disease Brother    Other Other        Family member with MGUS   Social History   Socioeconomic History   Marital status: Widowed    Spouse name: Iona Beard   Number of children: 3   Years of education: Not on file   Highest education level: 8th grade  Occupational History   Occupation: retired  Tobacco Use   Smoking status: Never   Smokeless tobacco: Never  Vaping Use   Vaping Use: Never used  Substance and Sexual Activity   Alcohol use: No    Alcohol/week: 0.0 standard drinks of alcohol   Drug use: No   Sexual activity: Not Currently    Partners: Male    Birth control/protection: Surgical    Comment: hysterectomy  Other Topics Concern   Not on file  Social History Narrative   Widow (2022), has 3 children (one lives near her).  Has 8 grandchildren, 2 greatgrandchildren.  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  No alcohol.  No drugs.Exercise: clean, work in yard.  10 siblings in all--5 have passed away--1 child age 74 with whooping cough , 1 lupus, 1 melanoma,1 lung cancer, 1 pulm fibrosis   Social Determinants of  Health   Financial Resource Strain: Low Risk  (07/20/2022)   Overall Financial Resource Strain (CARDIA)    Difficulty of Paying Living Expenses: Not hard at all  Food Insecurity: No Food Insecurity (11/22/2022)   Hunger Vital Sign    Worried About Running Out of Food in the Last Year: Never true    Ran Out of Food in the Last Year: Never true  Transportation Needs: No Transportation Needs (11/22/2022)   PRAPARE - Administrator, Civil Service (Medical): No    Lack of Transportation (Non-Medical): No  Physical Activity: Sufficiently Active (07/20/2022)   Exercise Vital Sign    Days of Exercise per Week: 7 days    Minutes of Exercise per Session: 60 min  Stress: No Stress Concern Present (07/20/2022)   [employer] of Occupational Health - Occupational Stress Questionnaire    Feeling of Stress : Only a little  Social Connections: Moderately Isolated (07/20/2022)   Social Connection and Isolation Panel [NHANES]    Frequency of Communication with Friends and Family: More than three times a week    Frequency of Social Gatherings with Friends and Family: More than three times a week    Attends Religious Services: 1 to 4 times per  year    Active Member of Clubs or Organizations: No    Attends Banker Meetings: Never    Marital Status: Widowed   Past Surgical History:  Procedure Laterality Date   ABDOMINAL HYSTERECTOMY  03/2015   TAH.  Last pap 2016.  No hx of abnl paps.  Per GYN, no further pap smears indicated.   ANTERIOR AND POSTERIOR REPAIR N/A 03/18/2014   Procedure: Cystocele repair with graft, Vault suspension, Rectocele repair;  Surgeon: Martina Sinner, MD;  [facility];  Service: Urology;  Laterality: N/A;   BIOPSY THYROID  06/2021   benign   CHOLECYSTECTOMY     CHOLECYSTECTOMY, LAPAROSCOPIC     COLON SURGERY     COLONOSCOPY  04/2006; 05/26/16   2007 (Dr. Jarold Motto): Normal.  04/2016 (Dr. Leone Payor) normal except diverticulosis and decreased  anal sphincter tone.  No repeat colonoscopy is recommended due to age.   cspine surgery     Dr. Estelle Grumbles level ant cerv discectomy Lavenia Atlas w/plating   CYSTO N/A 03/18/2014   Procedure: CYSTO;  Surgeon: Martina Sinner, MD;  [facility];  Service: Urology;  Laterality: N/A;   DEXA  07/30/2015; 06/2018   2017 and 2020 -->Osteopenia--repeat 2 yrs. T score -2.29 Mar 2021. Rpt 2 yrs.   LYSIS OF ADHESION N/A 01/31/2018   Procedure: POSSIBLE LYSIS OF ADHESION;  Surgeon: Adolphus Birchwood, MD;  [facility];  Service: Gynecology;  Laterality: N/A;   RECTOCELE REPAIR     with prolasped bledder repair   RESECTION OF COLON     BENIGN TUMOR   RIGHT EAR SURGERY  08/23/2017   Dr. 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  Procedure: XI ROBOTIC ASSISTED BILATERAL SALPINGO OOPHORECTOMY;  Surgeon: Adolphus Birchwood, MD;  [facility];  Service: Gynecology;  Laterality: Bilateral;   TRANSTHORACIC ECHOCARDIOGRAM  01/27/2020   2021 EF 65-70%, NORMAL. 12/01/21 grd I DD, o/w normal.   TUBAL LIGATION     Past Surgical History:  Procedure Laterality Date   ABDOMINAL HYSTERECTOMY  03/2015   TAH.  Last pap 2016.  No hx of abnl paps.  Per GYN, no further pap smears indicated.   ANTERIOR AND POSTERIOR REPAIR N/A 03/18/2014   Procedure: Cystocele repair with graft, Vault suspension, Rectocele repair;  Surgeon: Martina Sinner, MD;  [facility];  Service: Urology;  Laterality: N/A;   BIOPSY THYROID  06/2021   benign    CHOLECYSTECTOMY     CHOLECYSTECTOMY, LAPAROSCOPIC     COLON SURGERY     COLONOSCOPY  04/2006; 05/26/16   2007 (Dr. Jarold Motto): Normal.  04/2016 (Dr. Leone Payor) normal except diverticulosis and decreased anal sphincter tone.  No repeat colonoscopy is recommended due to age.   cspine surgery     Dr. Estelle Grumbles level ant cerv discectomy Lavenia Atlas w/plating   CYSTO N/A 03/18/2014   Procedure: CYSTO;  Surgeon: Martina Sinner, MD;  [facility];  Service: Urology;  Laterality: N/A;   DEXA  07/30/2015; 06/2018   2017 and 2020 -->Osteopenia--repeat 2 yrs. T score -2.29 Mar 2021. Rpt 2 yrs.   LYSIS OF ADHESION N/A 01/31/2018   Procedure: POSSIBLE LYSIS OF ADHESION;  Surgeon: Adolphus Birchwood, MD;  [facility];  Service: Gynecology;  Laterality: N/A;   RECTOCELE REPAIR     with prolasped bledder repair   RESECTION OF COLON     BENIGN TUMOR   RIGHT EAR SURGERY  08/23/2017   Dr. 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  Procedure: XI ROBOTIC ASSISTED BILATERAL SALPINGO OOPHORECTOMY;  Surgeon: Adolphus Birchwood, MD;  [facility];  Service: Gynecology;  Laterality: Bilateral;   TRANSTHORACIC ECHOCARDIOGRAM  01/27/2020   2021 EF 65-70%, NORMAL. 12/01/21 grd I DD, o/w normal.   TUBAL LIGATION     Past Medical History:  Diagnosis Date   Allergic rhinitis    maple pollen   Anxiety    Arthritis    Bowel obstruction (HCC)    Chronic low back pain    Dr. Dutch Quint.  Has had back injection--bp elevated after.   Chronic pain  syndrome    Chronic renal insufficiency, stage 2 (mild)    GFR 60-70   DDD (degenerative disc disease), cervical    Hx of ACDF (Dr. Jordan Likes).  Followed by Dr. Charlett Blake.  Also, Dr. Vickki Hearing do left C7-T1 intralaminal epidural injection.   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  Resid R arm and leg weakness + flexion contractures.   Hypercholesteremia    mild; pt declined statin trial 05/2014--needs recheck lipid panel at first f/u visit in 2017   Hypertension    LBBB (left bundle branch block)    Osteopenia 06/2017   T score -1.4 FRAX 15% / 2%   Ovarian cyst 2017   82019-robotic assisted bilatel SPO: all path benign.   Perianal dermatitis    prn cutivate   Phlebitis    Right hemiplegia (HCC) 01/2020   R arm and leg (hemorrhagic L cerebral hemisphere CVA)   Right knee injury 2018   Patellofemoral crush injury--Dr. Charlett Blake.   Solitary thyroid nodule 05/2021   needs bx->plan endo ref   Stroke [facility])    Syncope 10/2021   cardiac w/u as per Dr. Mayford Knife   Syncope    2023 (vasovagal suspected)   Trochanteric bursitis of right hip    Recurrent (injection by Dr. Charlett Blake 05/26/15)   Tympanic membrane rupture, right 12/01/2016   As of 08/2018 pt set for tympanomastoidectomy and STSG (Dr. Ignacia Felling). Recurrent fungal/bact OE.   Varicose vein    left leg    BP (!) 150/78   Pulse 73   LMP 06/27/1972 Comment: not sexually active  SpO2 95%   Opioid Risk Score:   Fall Risk Score:  `1  Depression screen [facility] 2/9     07/20/2022    1:21 PM 07/13/2022    1:39 PM 05/12/2022   11:57  AM 04/13/2022    3:10 PM 01/26/2022    2:58 PM 11/11/2021    1:16 PM 11/10/2021    3:29 PM  Depression screen PHQ 2/9  Decreased Interest 0 1 0 0 0 0 0  Down, Depressed,  Hopeless 0 1 0 0 0 0 0  PHQ - 2 Score 0 2 0 0 0 0 0     Review of Systems  Musculoskeletal:        Pain in right lower leg  All other systems reviewed and are negative.     Objective:   Physical Exam  G81.11 Dysport Injection for spasticity using needle EMG guidance Indication: No diagnosis found.   Dilution: 500 Units/86ml        Total Units Injected: 800 Indication: Severe spasticity which interferes with ADL,mobility and/or  hygiene and is unresponsive to medication management and other conservative care Informed consent was obtained after describing risks and benefits of the procedure with the patient. This includes bleeding, bruising, infection, excessive weakness, or medication side effects. A REMS form is on file and signed.  Needle: 50mm injectable monopolar needle electrode  Right  Number of units per muscle  Quadriceps 0 units Gastroc/soleus 500 units--6 access pts Hamstrings 0 units Tibialis Posterior 300 units 3 access pts Tibialis Anterior 0 units EHL 0 units All injections were done after obtaining appropriate EMG activity and after negative drawback for blood. The patient tolerated the procedure well. Post procedure instructions were given. No follow-ups on file.

## 2023-01-05 DIAGNOSIS — M62522 Muscle wasting and atrophy, not elsewhere classified, left upper arm: Secondary | ICD-10-CM | POA: Diagnosis not present

## 2023-01-05 DIAGNOSIS — M63851 Disorders of muscle in diseases classified elsewhere, right thigh: Secondary | ICD-10-CM | POA: Diagnosis not present

## 2023-01-05 DIAGNOSIS — M21371 Foot drop, right foot: Secondary | ICD-10-CM | POA: Diagnosis not present

## 2023-01-05 DIAGNOSIS — M63861 Disorders of muscle in diseases classified elsewhere, right lower leg: Secondary | ICD-10-CM | POA: Diagnosis not present

## 2023-01-05 DIAGNOSIS — M63852 Disorders of muscle in diseases classified elsewhere, left thigh: Secondary | ICD-10-CM | POA: Diagnosis not present

## 2023-01-05 DIAGNOSIS — M62521 Muscle wasting and atrophy, not elsewhere classified, right upper arm: Secondary | ICD-10-CM | POA: Diagnosis not present

## 2023-01-05 DIAGNOSIS — Z9181 History of falling: Secondary | ICD-10-CM | POA: Diagnosis not present

## 2023-01-05 DIAGNOSIS — M63862 Disorders of muscle in diseases classified elsewhere, left lower leg: Secondary | ICD-10-CM | POA: Diagnosis not present

## 2023-01-05 DIAGNOSIS — G8193 Hemiplegia, unspecified affecting right nondominant side: Secondary | ICD-10-CM | POA: Diagnosis not present

## 2023-01-05 DIAGNOSIS — M25511 Pain in right shoulder: Secondary | ICD-10-CM | POA: Diagnosis not present

## 2023-01-06 DIAGNOSIS — M63862 Disorders of muscle in diseases classified elsewhere, left lower leg: Secondary | ICD-10-CM | POA: Diagnosis not present

## 2023-01-06 DIAGNOSIS — M62521 Muscle wasting and atrophy, not elsewhere classified, right upper arm: Secondary | ICD-10-CM | POA: Diagnosis not present

## 2023-01-06 DIAGNOSIS — M63851 Disorders of muscle in diseases classified elsewhere, right thigh: Secondary | ICD-10-CM | POA: Diagnosis not present

## 2023-01-06 DIAGNOSIS — M63852 Disorders of muscle in diseases classified elsewhere, left thigh: Secondary | ICD-10-CM | POA: Diagnosis not present

## 2023-01-06 DIAGNOSIS — M25511 Pain in right shoulder: Secondary | ICD-10-CM | POA: Diagnosis not present

## 2023-01-06 DIAGNOSIS — M63861 Disorders of muscle in diseases classified elsewhere, right lower leg: Secondary | ICD-10-CM | POA: Diagnosis not present

## 2023-01-06 DIAGNOSIS — M21371 Foot drop, right foot: Secondary | ICD-10-CM | POA: Diagnosis not present

## 2023-01-06 DIAGNOSIS — R1312 Dysphagia, oropharyngeal phase: Secondary | ICD-10-CM | POA: Diagnosis not present

## 2023-01-06 DIAGNOSIS — G8193 Hemiplegia, unspecified affecting right nondominant side: Secondary | ICD-10-CM | POA: Diagnosis not present

## 2023-01-06 DIAGNOSIS — M62522 Muscle wasting and atrophy, not elsewhere classified, left upper arm: Secondary | ICD-10-CM | POA: Diagnosis not present

## 2023-01-06 DIAGNOSIS — Z9181 History of falling: Secondary | ICD-10-CM | POA: Diagnosis not present

## 2023-01-09 DIAGNOSIS — G8193 Hemiplegia, unspecified affecting right nondominant side: Secondary | ICD-10-CM | POA: Diagnosis not present

## 2023-01-09 DIAGNOSIS — M62522 Muscle wasting and atrophy, not elsewhere classified, left upper arm: Secondary | ICD-10-CM | POA: Diagnosis not present

## 2023-01-09 DIAGNOSIS — M62521 Muscle wasting and atrophy, not elsewhere classified, right upper arm: Secondary | ICD-10-CM | POA: Diagnosis not present

## 2023-01-09 DIAGNOSIS — Z9181 History of falling: Secondary | ICD-10-CM | POA: Diagnosis not present

## 2023-01-09 DIAGNOSIS — M63861 Disorders of muscle in diseases classified elsewhere, right lower leg: Secondary | ICD-10-CM | POA: Diagnosis not present

## 2023-01-09 DIAGNOSIS — N390 Urinary tract infection, site not specified: Secondary | ICD-10-CM | POA: Diagnosis not present

## 2023-01-09 DIAGNOSIS — M25511 Pain in right shoulder: Secondary | ICD-10-CM | POA: Diagnosis not present

## 2023-01-09 DIAGNOSIS — M63862 Disorders of muscle in diseases classified elsewhere, left lower leg: Secondary | ICD-10-CM | POA: Diagnosis not present

## 2023-01-09 DIAGNOSIS — M21371 Foot drop, right foot: Secondary | ICD-10-CM | POA: Diagnosis not present

## 2023-01-09 DIAGNOSIS — M63851 Disorders of muscle in diseases classified elsewhere, right thigh: Secondary | ICD-10-CM | POA: Diagnosis not present

## 2023-01-09 DIAGNOSIS — M63852 Disorders of muscle in diseases classified elsewhere, left thigh: Secondary | ICD-10-CM | POA: Diagnosis not present

## 2023-01-10 DIAGNOSIS — M63862 Disorders of muscle in diseases classified elsewhere, left lower leg: Secondary | ICD-10-CM | POA: Diagnosis not present

## 2023-01-10 DIAGNOSIS — M21371 Foot drop, right foot: Secondary | ICD-10-CM | POA: Diagnosis not present

## 2023-01-10 DIAGNOSIS — M63861 Disorders of muscle in diseases classified elsewhere, right lower leg: Secondary | ICD-10-CM | POA: Diagnosis not present

## 2023-01-10 DIAGNOSIS — M63851 Disorders of muscle in diseases classified elsewhere, right thigh: Secondary | ICD-10-CM | POA: Diagnosis not present

## 2023-01-10 DIAGNOSIS — R1312 Dysphagia, oropharyngeal phase: Secondary | ICD-10-CM | POA: Diagnosis not present

## 2023-01-10 DIAGNOSIS — Z9181 History of falling: Secondary | ICD-10-CM | POA: Diagnosis not present

## 2023-01-10 DIAGNOSIS — M63852 Disorders of muscle in diseases classified elsewhere, left thigh: Secondary | ICD-10-CM | POA: Diagnosis not present

## 2023-01-11 DIAGNOSIS — M63862 Disorders of muscle in diseases classified elsewhere, left lower leg: Secondary | ICD-10-CM | POA: Diagnosis not present

## 2023-01-11 DIAGNOSIS — R1312 Dysphagia, oropharyngeal phase: Secondary | ICD-10-CM | POA: Diagnosis not present

## 2023-01-11 DIAGNOSIS — M63861 Disorders of muscle in diseases classified elsewhere, right lower leg: Secondary | ICD-10-CM | POA: Diagnosis not present

## 2023-01-11 DIAGNOSIS — M63852 Disorders of muscle in diseases classified elsewhere, left thigh: Secondary | ICD-10-CM | POA: Diagnosis not present

## 2023-01-11 DIAGNOSIS — M21371 Foot drop, right foot: Secondary | ICD-10-CM | POA: Diagnosis not present

## 2023-01-11 DIAGNOSIS — M63851 Disorders of muscle in diseases classified elsewhere, right thigh: Secondary | ICD-10-CM | POA: Diagnosis not present

## 2023-01-11 DIAGNOSIS — Z9181 History of falling: Secondary | ICD-10-CM | POA: Diagnosis not present

## 2023-01-12 DIAGNOSIS — M63861 Disorders of muscle in diseases classified elsewhere, right lower leg: Secondary | ICD-10-CM | POA: Diagnosis not present

## 2023-01-12 DIAGNOSIS — M21371 Foot drop, right foot: Secondary | ICD-10-CM | POA: Diagnosis not present

## 2023-01-12 DIAGNOSIS — M63862 Disorders of muscle in diseases classified elsewhere, left lower leg: Secondary | ICD-10-CM | POA: Diagnosis not present

## 2023-01-12 DIAGNOSIS — M62521 Muscle wasting and atrophy, not elsewhere classified, right upper arm: Secondary | ICD-10-CM | POA: Diagnosis not present

## 2023-01-12 DIAGNOSIS — M62522 Muscle wasting and atrophy, not elsewhere classified, left upper arm: Secondary | ICD-10-CM | POA: Diagnosis not present

## 2023-01-12 DIAGNOSIS — Z8673 Personal history of transient ischemic attack (TIA), and cerebral infarction without residual deficits: Secondary | ICD-10-CM | POA: Diagnosis not present

## 2023-01-12 DIAGNOSIS — M63851 Disorders of muscle in diseases classified elsewhere, right thigh: Secondary | ICD-10-CM | POA: Diagnosis not present

## 2023-01-12 DIAGNOSIS — G8193 Hemiplegia, unspecified affecting right nondominant side: Secondary | ICD-10-CM | POA: Diagnosis not present

## 2023-01-12 DIAGNOSIS — M25511 Pain in right shoulder: Secondary | ICD-10-CM | POA: Diagnosis not present

## 2023-01-12 DIAGNOSIS — M1990 Unspecified osteoarthritis, unspecified site: Secondary | ICD-10-CM | POA: Diagnosis not present

## 2023-01-12 DIAGNOSIS — Z9181 History of falling: Secondary | ICD-10-CM | POA: Diagnosis not present

## 2023-01-12 DIAGNOSIS — M63852 Disorders of muscle in diseases classified elsewhere, left thigh: Secondary | ICD-10-CM | POA: Diagnosis not present

## 2023-01-12 DIAGNOSIS — N390 Urinary tract infection, site not specified: Secondary | ICD-10-CM | POA: Diagnosis not present

## 2023-01-16 DIAGNOSIS — G8193 Hemiplegia, unspecified affecting right nondominant side: Secondary | ICD-10-CM | POA: Diagnosis not present

## 2023-01-16 DIAGNOSIS — M62522 Muscle wasting and atrophy, not elsewhere classified, left upper arm: Secondary | ICD-10-CM | POA: Diagnosis not present

## 2023-01-16 DIAGNOSIS — M25511 Pain in right shoulder: Secondary | ICD-10-CM | POA: Diagnosis not present

## 2023-01-16 DIAGNOSIS — M63851 Disorders of muscle in diseases classified elsewhere, right thigh: Secondary | ICD-10-CM | POA: Diagnosis not present

## 2023-01-16 DIAGNOSIS — M63861 Disorders of muscle in diseases classified elsewhere, right lower leg: Secondary | ICD-10-CM | POA: Diagnosis not present

## 2023-01-16 DIAGNOSIS — M62521 Muscle wasting and atrophy, not elsewhere classified, right upper arm: Secondary | ICD-10-CM | POA: Diagnosis not present

## 2023-01-16 DIAGNOSIS — H6121 Impacted cerumen, right ear: Secondary | ICD-10-CM | POA: Diagnosis not present

## 2023-01-16 DIAGNOSIS — M63862 Disorders of muscle in diseases classified elsewhere, left lower leg: Secondary | ICD-10-CM | POA: Diagnosis not present

## 2023-01-16 DIAGNOSIS — M21371 Foot drop, right foot: Secondary | ICD-10-CM | POA: Diagnosis not present

## 2023-01-16 DIAGNOSIS — M63852 Disorders of muscle in diseases classified elsewhere, left thigh: Secondary | ICD-10-CM | POA: Diagnosis not present

## 2023-01-16 DIAGNOSIS — H7291 Unspecified perforation of tympanic membrane, right ear: Secondary | ICD-10-CM | POA: Diagnosis not present

## 2023-01-16 DIAGNOSIS — Z9181 History of falling: Secondary | ICD-10-CM | POA: Diagnosis not present

## 2023-01-17 DIAGNOSIS — R1312 Dysphagia, oropharyngeal phase: Secondary | ICD-10-CM | POA: Diagnosis not present

## 2023-01-17 DIAGNOSIS — Z9181 History of falling: Secondary | ICD-10-CM | POA: Diagnosis not present

## 2023-01-17 DIAGNOSIS — M63851 Disorders of muscle in diseases classified elsewhere, right thigh: Secondary | ICD-10-CM | POA: Diagnosis not present

## 2023-01-17 DIAGNOSIS — M63861 Disorders of muscle in diseases classified elsewhere, right lower leg: Secondary | ICD-10-CM | POA: Diagnosis not present

## 2023-01-17 DIAGNOSIS — M63862 Disorders of muscle in diseases classified elsewhere, left lower leg: Secondary | ICD-10-CM | POA: Diagnosis not present

## 2023-01-17 DIAGNOSIS — M63852 Disorders of muscle in diseases classified elsewhere, left thigh: Secondary | ICD-10-CM | POA: Diagnosis not present

## 2023-01-17 DIAGNOSIS — M21371 Foot drop, right foot: Secondary | ICD-10-CM | POA: Diagnosis not present

## 2023-01-18 DIAGNOSIS — M62522 Muscle wasting and atrophy, not elsewhere classified, left upper arm: Secondary | ICD-10-CM | POA: Diagnosis not present

## 2023-01-18 DIAGNOSIS — M21371 Foot drop, right foot: Secondary | ICD-10-CM | POA: Diagnosis not present

## 2023-01-18 DIAGNOSIS — M63851 Disorders of muscle in diseases classified elsewhere, right thigh: Secondary | ICD-10-CM | POA: Diagnosis not present

## 2023-01-18 DIAGNOSIS — M63861 Disorders of muscle in diseases classified elsewhere, right lower leg: Secondary | ICD-10-CM | POA: Diagnosis not present

## 2023-01-18 DIAGNOSIS — I7091 Generalized atherosclerosis: Secondary | ICD-10-CM | POA: Diagnosis not present

## 2023-01-18 DIAGNOSIS — M63862 Disorders of muscle in diseases classified elsewhere, left lower leg: Secondary | ICD-10-CM | POA: Diagnosis not present

## 2023-01-18 DIAGNOSIS — G8193 Hemiplegia, unspecified affecting right nondominant side: Secondary | ICD-10-CM | POA: Diagnosis not present

## 2023-01-18 DIAGNOSIS — B351 Tinea unguium: Secondary | ICD-10-CM | POA: Diagnosis not present

## 2023-01-18 DIAGNOSIS — M25511 Pain in right shoulder: Secondary | ICD-10-CM | POA: Diagnosis not present

## 2023-01-18 DIAGNOSIS — M63852 Disorders of muscle in diseases classified elsewhere, left thigh: Secondary | ICD-10-CM | POA: Diagnosis not present

## 2023-01-18 DIAGNOSIS — M62521 Muscle wasting and atrophy, not elsewhere classified, right upper arm: Secondary | ICD-10-CM | POA: Diagnosis not present

## 2023-01-18 DIAGNOSIS — Z9181 History of falling: Secondary | ICD-10-CM | POA: Diagnosis not present

## 2023-01-19 DIAGNOSIS — Z7689 Persons encountering health services in other specified circumstances: Secondary | ICD-10-CM | POA: Diagnosis not present

## 2023-01-19 DIAGNOSIS — M63862 Disorders of muscle in diseases classified elsewhere, left lower leg: Secondary | ICD-10-CM | POA: Diagnosis not present

## 2023-01-19 DIAGNOSIS — M63852 Disorders of muscle in diseases classified elsewhere, left thigh: Secondary | ICD-10-CM | POA: Diagnosis not present

## 2023-01-19 DIAGNOSIS — G8193 Hemiplegia, unspecified affecting right nondominant side: Secondary | ICD-10-CM | POA: Diagnosis not present

## 2023-01-19 DIAGNOSIS — M21371 Foot drop, right foot: Secondary | ICD-10-CM | POA: Diagnosis not present

## 2023-01-19 DIAGNOSIS — M62522 Muscle wasting and atrophy, not elsewhere classified, left upper arm: Secondary | ICD-10-CM | POA: Diagnosis not present

## 2023-01-19 DIAGNOSIS — Z9181 History of falling: Secondary | ICD-10-CM | POA: Diagnosis not present

## 2023-01-19 DIAGNOSIS — M63861 Disorders of muscle in diseases classified elsewhere, right lower leg: Secondary | ICD-10-CM | POA: Diagnosis not present

## 2023-01-19 DIAGNOSIS — E559 Vitamin D deficiency, unspecified: Secondary | ICD-10-CM | POA: Diagnosis not present

## 2023-01-19 DIAGNOSIS — M62521 Muscle wasting and atrophy, not elsewhere classified, right upper arm: Secondary | ICD-10-CM | POA: Diagnosis not present

## 2023-01-19 DIAGNOSIS — M63851 Disorders of muscle in diseases classified elsewhere, right thigh: Secondary | ICD-10-CM | POA: Diagnosis not present

## 2023-01-19 DIAGNOSIS — M25511 Pain in right shoulder: Secondary | ICD-10-CM | POA: Diagnosis not present

## 2023-01-19 DIAGNOSIS — E538 Deficiency of other specified B group vitamins: Secondary | ICD-10-CM | POA: Diagnosis not present

## 2023-01-23 ENCOUNTER — Other Ambulatory Visit: Payer: Self-pay | Admitting: Family Medicine

## 2023-01-23 DIAGNOSIS — M25511 Pain in right shoulder: Secondary | ICD-10-CM | POA: Diagnosis not present

## 2023-01-23 DIAGNOSIS — G8193 Hemiplegia, unspecified affecting right nondominant side: Secondary | ICD-10-CM | POA: Diagnosis not present

## 2023-01-23 DIAGNOSIS — M62522 Muscle wasting and atrophy, not elsewhere classified, left upper arm: Secondary | ICD-10-CM | POA: Diagnosis not present

## 2023-01-23 DIAGNOSIS — M62521 Muscle wasting and atrophy, not elsewhere classified, right upper arm: Secondary | ICD-10-CM | POA: Diagnosis not present

## 2023-01-24 DIAGNOSIS — M63862 Disorders of muscle in diseases classified elsewhere, left lower leg: Secondary | ICD-10-CM | POA: Diagnosis not present

## 2023-01-24 DIAGNOSIS — M63851 Disorders of muscle in diseases classified elsewhere, right thigh: Secondary | ICD-10-CM | POA: Diagnosis not present

## 2023-01-24 DIAGNOSIS — M63852 Disorders of muscle in diseases classified elsewhere, left thigh: Secondary | ICD-10-CM | POA: Diagnosis not present

## 2023-01-24 DIAGNOSIS — Z9181 History of falling: Secondary | ICD-10-CM | POA: Diagnosis not present

## 2023-01-24 DIAGNOSIS — M25511 Pain in right shoulder: Secondary | ICD-10-CM | POA: Diagnosis not present

## 2023-01-24 DIAGNOSIS — M63861 Disorders of muscle in diseases classified elsewhere, right lower leg: Secondary | ICD-10-CM | POA: Diagnosis not present

## 2023-01-24 DIAGNOSIS — R1312 Dysphagia, oropharyngeal phase: Secondary | ICD-10-CM | POA: Diagnosis not present

## 2023-01-24 DIAGNOSIS — M62522 Muscle wasting and atrophy, not elsewhere classified, left upper arm: Secondary | ICD-10-CM | POA: Diagnosis not present

## 2023-01-24 DIAGNOSIS — M21371 Foot drop, right foot: Secondary | ICD-10-CM | POA: Diagnosis not present

## 2023-01-24 DIAGNOSIS — M62521 Muscle wasting and atrophy, not elsewhere classified, right upper arm: Secondary | ICD-10-CM | POA: Diagnosis not present

## 2023-01-24 DIAGNOSIS — G8193 Hemiplegia, unspecified affecting right nondominant side: Secondary | ICD-10-CM | POA: Diagnosis not present

## 2023-01-25 DIAGNOSIS — M25511 Pain in right shoulder: Secondary | ICD-10-CM | POA: Diagnosis not present

## 2023-01-25 DIAGNOSIS — M62522 Muscle wasting and atrophy, not elsewhere classified, left upper arm: Secondary | ICD-10-CM | POA: Diagnosis not present

## 2023-01-25 DIAGNOSIS — Z9181 History of falling: Secondary | ICD-10-CM | POA: Diagnosis not present

## 2023-01-25 DIAGNOSIS — M63852 Disorders of muscle in diseases classified elsewhere, left thigh: Secondary | ICD-10-CM | POA: Diagnosis not present

## 2023-01-25 DIAGNOSIS — M63862 Disorders of muscle in diseases classified elsewhere, left lower leg: Secondary | ICD-10-CM | POA: Diagnosis not present

## 2023-01-25 DIAGNOSIS — M21371 Foot drop, right foot: Secondary | ICD-10-CM | POA: Diagnosis not present

## 2023-01-25 DIAGNOSIS — M63861 Disorders of muscle in diseases classified elsewhere, right lower leg: Secondary | ICD-10-CM | POA: Diagnosis not present

## 2023-01-25 DIAGNOSIS — G8193 Hemiplegia, unspecified affecting right nondominant side: Secondary | ICD-10-CM | POA: Diagnosis not present

## 2023-01-25 DIAGNOSIS — M63851 Disorders of muscle in diseases classified elsewhere, right thigh: Secondary | ICD-10-CM | POA: Diagnosis not present

## 2023-01-25 DIAGNOSIS — M62521 Muscle wasting and atrophy, not elsewhere classified, right upper arm: Secondary | ICD-10-CM | POA: Diagnosis not present

## 2023-01-26 DIAGNOSIS — M21371 Foot drop, right foot: Secondary | ICD-10-CM | POA: Diagnosis not present

## 2023-01-26 DIAGNOSIS — M63862 Disorders of muscle in diseases classified elsewhere, left lower leg: Secondary | ICD-10-CM | POA: Diagnosis not present

## 2023-01-26 DIAGNOSIS — Z9181 History of falling: Secondary | ICD-10-CM | POA: Diagnosis not present

## 2023-01-26 DIAGNOSIS — R1312 Dysphagia, oropharyngeal phase: Secondary | ICD-10-CM | POA: Diagnosis not present

## 2023-01-26 DIAGNOSIS — M63851 Disorders of muscle in diseases classified elsewhere, right thigh: Secondary | ICD-10-CM | POA: Diagnosis not present

## 2023-01-26 DIAGNOSIS — M63861 Disorders of muscle in diseases classified elsewhere, right lower leg: Secondary | ICD-10-CM | POA: Diagnosis not present

## 2023-01-26 DIAGNOSIS — M63852 Disorders of muscle in diseases classified elsewhere, left thigh: Secondary | ICD-10-CM | POA: Diagnosis not present

## 2023-01-30 DIAGNOSIS — M63862 Disorders of muscle in diseases classified elsewhere, left lower leg: Secondary | ICD-10-CM | POA: Diagnosis not present

## 2023-01-30 DIAGNOSIS — M21371 Foot drop, right foot: Secondary | ICD-10-CM | POA: Diagnosis not present

## 2023-01-30 DIAGNOSIS — M63851 Disorders of muscle in diseases classified elsewhere, right thigh: Secondary | ICD-10-CM | POA: Diagnosis not present

## 2023-01-30 DIAGNOSIS — Z9181 History of falling: Secondary | ICD-10-CM | POA: Diagnosis not present

## 2023-01-30 DIAGNOSIS — M63852 Disorders of muscle in diseases classified elsewhere, left thigh: Secondary | ICD-10-CM | POA: Diagnosis not present

## 2023-01-30 DIAGNOSIS — M63861 Disorders of muscle in diseases classified elsewhere, right lower leg: Secondary | ICD-10-CM | POA: Diagnosis not present

## 2023-01-31 DIAGNOSIS — Z9181 History of falling: Secondary | ICD-10-CM | POA: Diagnosis not present

## 2023-01-31 DIAGNOSIS — M62522 Muscle wasting and atrophy, not elsewhere classified, left upper arm: Secondary | ICD-10-CM | POA: Diagnosis not present

## 2023-01-31 DIAGNOSIS — M21371 Foot drop, right foot: Secondary | ICD-10-CM | POA: Diagnosis not present

## 2023-01-31 DIAGNOSIS — M63851 Disorders of muscle in diseases classified elsewhere, right thigh: Secondary | ICD-10-CM | POA: Diagnosis not present

## 2023-01-31 DIAGNOSIS — G8193 Hemiplegia, unspecified affecting right nondominant side: Secondary | ICD-10-CM | POA: Diagnosis not present

## 2023-01-31 DIAGNOSIS — M62521 Muscle wasting and atrophy, not elsewhere classified, right upper arm: Secondary | ICD-10-CM | POA: Diagnosis not present

## 2023-01-31 DIAGNOSIS — M25511 Pain in right shoulder: Secondary | ICD-10-CM | POA: Diagnosis not present

## 2023-01-31 DIAGNOSIS — M63861 Disorders of muscle in diseases classified elsewhere, right lower leg: Secondary | ICD-10-CM | POA: Diagnosis not present

## 2023-01-31 DIAGNOSIS — M63862 Disorders of muscle in diseases classified elsewhere, left lower leg: Secondary | ICD-10-CM | POA: Diagnosis not present

## 2023-01-31 DIAGNOSIS — M63852 Disorders of muscle in diseases classified elsewhere, left thigh: Secondary | ICD-10-CM | POA: Diagnosis not present

## 2023-02-01 ENCOUNTER — Ambulatory Visit: Payer: Medicare Other | Admitting: Physician Assistant

## 2023-02-01 DIAGNOSIS — M63861 Disorders of muscle in diseases classified elsewhere, right lower leg: Secondary | ICD-10-CM | POA: Diagnosis not present

## 2023-02-01 DIAGNOSIS — R1312 Dysphagia, oropharyngeal phase: Secondary | ICD-10-CM | POA: Diagnosis not present

## 2023-02-01 DIAGNOSIS — Z9181 History of falling: Secondary | ICD-10-CM | POA: Diagnosis not present

## 2023-02-01 DIAGNOSIS — M63852 Disorders of muscle in diseases classified elsewhere, left thigh: Secondary | ICD-10-CM | POA: Diagnosis not present

## 2023-02-01 DIAGNOSIS — M63851 Disorders of muscle in diseases classified elsewhere, right thigh: Secondary | ICD-10-CM | POA: Diagnosis not present

## 2023-02-01 DIAGNOSIS — M63862 Disorders of muscle in diseases classified elsewhere, left lower leg: Secondary | ICD-10-CM | POA: Diagnosis not present

## 2023-02-01 DIAGNOSIS — M21371 Foot drop, right foot: Secondary | ICD-10-CM | POA: Diagnosis not present

## 2023-02-09 ENCOUNTER — Ambulatory Visit (HOSPITAL_COMMUNITY): Payer: Medicare Other | Admitting: Occupational Therapy

## 2023-02-10 ENCOUNTER — Other Ambulatory Visit: Payer: Self-pay | Admitting: Family Medicine

## 2023-02-15 ENCOUNTER — Ambulatory Visit (HOSPITAL_COMMUNITY): Payer: Medicare Other | Admitting: Occupational Therapy

## 2023-03-01 DIAGNOSIS — R269 Unspecified abnormalities of gait and mobility: Secondary | ICD-10-CM | POA: Diagnosis not present

## 2023-03-01 DIAGNOSIS — Z8673 Personal history of transient ischemic attack (TIA), and cerebral infarction without residual deficits: Secondary | ICD-10-CM | POA: Diagnosis not present

## 2023-03-01 DIAGNOSIS — M21379 Foot drop, unspecified foot: Secondary | ICD-10-CM | POA: Diagnosis not present

## 2023-03-01 DIAGNOSIS — M6281 Muscle weakness (generalized): Secondary | ICD-10-CM | POA: Diagnosis not present

## 2023-03-03 DIAGNOSIS — Z9181 History of falling: Secondary | ICD-10-CM | POA: Diagnosis not present

## 2023-03-03 DIAGNOSIS — N390 Urinary tract infection, site not specified: Secondary | ICD-10-CM | POA: Diagnosis not present

## 2023-03-03 DIAGNOSIS — R3914 Feeling of incomplete bladder emptying: Secondary | ICD-10-CM | POA: Diagnosis not present

## 2023-03-03 DIAGNOSIS — R1310 Dysphagia, unspecified: Secondary | ICD-10-CM | POA: Diagnosis not present

## 2023-03-03 DIAGNOSIS — M21371 Foot drop, right foot: Secondary | ICD-10-CM | POA: Diagnosis not present

## 2023-03-03 DIAGNOSIS — Z556 Problems related to health literacy: Secondary | ICD-10-CM | POA: Diagnosis not present

## 2023-03-03 DIAGNOSIS — Z7982 Long term (current) use of aspirin: Secondary | ICD-10-CM | POA: Diagnosis not present

## 2023-03-03 DIAGNOSIS — K219 Gastro-esophageal reflux disease without esophagitis: Secondary | ICD-10-CM | POA: Diagnosis not present

## 2023-03-03 DIAGNOSIS — Z79899 Other long term (current) drug therapy: Secondary | ICD-10-CM | POA: Diagnosis not present

## 2023-03-03 DIAGNOSIS — I10 Essential (primary) hypertension: Secondary | ICD-10-CM | POA: Diagnosis not present

## 2023-03-03 DIAGNOSIS — N3020 Other chronic cystitis without hematuria: Secondary | ICD-10-CM | POA: Diagnosis not present

## 2023-03-03 DIAGNOSIS — I4510 Unspecified right bundle-branch block: Secondary | ICD-10-CM | POA: Diagnosis not present

## 2023-03-03 DIAGNOSIS — I69351 Hemiplegia and hemiparesis following cerebral infarction affecting right dominant side: Secondary | ICD-10-CM | POA: Diagnosis not present

## 2023-03-03 DIAGNOSIS — I69398 Other sequelae of cerebral infarction: Secondary | ICD-10-CM | POA: Diagnosis not present

## 2023-03-03 DIAGNOSIS — E785 Hyperlipidemia, unspecified: Secondary | ICD-10-CM | POA: Diagnosis not present

## 2023-03-07 DIAGNOSIS — R490 Dysphonia: Secondary | ICD-10-CM | POA: Diagnosis not present

## 2023-03-07 DIAGNOSIS — I69398 Other sequelae of cerebral infarction: Secondary | ICD-10-CM | POA: Diagnosis not present

## 2023-03-07 DIAGNOSIS — I4510 Unspecified right bundle-branch block: Secondary | ICD-10-CM | POA: Diagnosis not present

## 2023-03-07 DIAGNOSIS — I10 Essential (primary) hypertension: Secondary | ICD-10-CM | POA: Diagnosis not present

## 2023-03-07 DIAGNOSIS — M21371 Foot drop, right foot: Secondary | ICD-10-CM | POA: Diagnosis not present

## 2023-03-07 DIAGNOSIS — R1310 Dysphagia, unspecified: Secondary | ICD-10-CM | POA: Diagnosis not present

## 2023-03-07 DIAGNOSIS — J387 Other diseases of larynx: Secondary | ICD-10-CM | POA: Diagnosis not present

## 2023-03-07 DIAGNOSIS — I69351 Hemiplegia and hemiparesis following cerebral infarction affecting right dominant side: Secondary | ICD-10-CM | POA: Diagnosis not present

## 2023-03-08 DIAGNOSIS — L304 Erythema intertrigo: Secondary | ICD-10-CM | POA: Diagnosis not present

## 2023-03-08 DIAGNOSIS — K649 Unspecified hemorrhoids: Secondary | ICD-10-CM | POA: Diagnosis not present

## 2023-03-08 DIAGNOSIS — M21371 Foot drop, right foot: Secondary | ICD-10-CM | POA: Diagnosis not present

## 2023-03-08 DIAGNOSIS — I10 Essential (primary) hypertension: Secondary | ICD-10-CM | POA: Diagnosis not present

## 2023-03-08 DIAGNOSIS — R1310 Dysphagia, unspecified: Secondary | ICD-10-CM | POA: Diagnosis not present

## 2023-03-08 DIAGNOSIS — I69351 Hemiplegia and hemiparesis following cerebral infarction affecting right dominant side: Secondary | ICD-10-CM | POA: Diagnosis not present

## 2023-03-08 DIAGNOSIS — I69398 Other sequelae of cerebral infarction: Secondary | ICD-10-CM | POA: Diagnosis not present

## 2023-03-08 DIAGNOSIS — I4510 Unspecified right bundle-branch block: Secondary | ICD-10-CM | POA: Diagnosis not present

## 2023-03-09 DIAGNOSIS — K5900 Constipation, unspecified: Secondary | ICD-10-CM | POA: Diagnosis not present

## 2023-03-09 DIAGNOSIS — I69351 Hemiplegia and hemiparesis following cerebral infarction affecting right dominant side: Secondary | ICD-10-CM | POA: Diagnosis not present

## 2023-03-09 DIAGNOSIS — R1310 Dysphagia, unspecified: Secondary | ICD-10-CM | POA: Diagnosis not present

## 2023-03-09 DIAGNOSIS — M21371 Foot drop, right foot: Secondary | ICD-10-CM | POA: Diagnosis not present

## 2023-03-09 DIAGNOSIS — M6281 Muscle weakness (generalized): Secondary | ICD-10-CM | POA: Diagnosis not present

## 2023-03-09 DIAGNOSIS — I10 Essential (primary) hypertension: Secondary | ICD-10-CM | POA: Diagnosis not present

## 2023-03-09 DIAGNOSIS — I4510 Unspecified right bundle-branch block: Secondary | ICD-10-CM | POA: Diagnosis not present

## 2023-03-09 DIAGNOSIS — Z8673 Personal history of transient ischemic attack (TIA), and cerebral infarction without residual deficits: Secondary | ICD-10-CM | POA: Diagnosis not present

## 2023-03-09 DIAGNOSIS — I69398 Other sequelae of cerebral infarction: Secondary | ICD-10-CM | POA: Diagnosis not present

## 2023-03-14 DIAGNOSIS — I69351 Hemiplegia and hemiparesis following cerebral infarction affecting right dominant side: Secondary | ICD-10-CM | POA: Diagnosis not present

## 2023-03-14 DIAGNOSIS — I10 Essential (primary) hypertension: Secondary | ICD-10-CM | POA: Diagnosis not present

## 2023-03-14 DIAGNOSIS — R1310 Dysphagia, unspecified: Secondary | ICD-10-CM | POA: Diagnosis not present

## 2023-03-14 DIAGNOSIS — M21371 Foot drop, right foot: Secondary | ICD-10-CM | POA: Diagnosis not present

## 2023-03-14 DIAGNOSIS — I69398 Other sequelae of cerebral infarction: Secondary | ICD-10-CM | POA: Diagnosis not present

## 2023-03-14 DIAGNOSIS — I4510 Unspecified right bundle-branch block: Secondary | ICD-10-CM | POA: Diagnosis not present

## 2023-03-15 DIAGNOSIS — I4510 Unspecified right bundle-branch block: Secondary | ICD-10-CM | POA: Diagnosis not present

## 2023-03-15 DIAGNOSIS — I10 Essential (primary) hypertension: Secondary | ICD-10-CM | POA: Diagnosis not present

## 2023-03-15 DIAGNOSIS — R1310 Dysphagia, unspecified: Secondary | ICD-10-CM | POA: Diagnosis not present

## 2023-03-15 DIAGNOSIS — I69398 Other sequelae of cerebral infarction: Secondary | ICD-10-CM | POA: Diagnosis not present

## 2023-03-15 DIAGNOSIS — M21371 Foot drop, right foot: Secondary | ICD-10-CM | POA: Diagnosis not present

## 2023-03-15 DIAGNOSIS — I69351 Hemiplegia and hemiparesis following cerebral infarction affecting right dominant side: Secondary | ICD-10-CM | POA: Diagnosis not present

## 2023-03-16 DIAGNOSIS — M21371 Foot drop, right foot: Secondary | ICD-10-CM | POA: Diagnosis not present

## 2023-03-16 DIAGNOSIS — R1310 Dysphagia, unspecified: Secondary | ICD-10-CM | POA: Diagnosis not present

## 2023-03-16 DIAGNOSIS — I10 Essential (primary) hypertension: Secondary | ICD-10-CM | POA: Diagnosis not present

## 2023-03-16 DIAGNOSIS — I69398 Other sequelae of cerebral infarction: Secondary | ICD-10-CM | POA: Diagnosis not present

## 2023-03-16 DIAGNOSIS — I69351 Hemiplegia and hemiparesis following cerebral infarction affecting right dominant side: Secondary | ICD-10-CM | POA: Diagnosis not present

## 2023-03-16 DIAGNOSIS — I4510 Unspecified right bundle-branch block: Secondary | ICD-10-CM | POA: Diagnosis not present

## 2023-03-17 DIAGNOSIS — I4510 Unspecified right bundle-branch block: Secondary | ICD-10-CM | POA: Diagnosis not present

## 2023-03-17 DIAGNOSIS — I69398 Other sequelae of cerebral infarction: Secondary | ICD-10-CM | POA: Diagnosis not present

## 2023-03-17 DIAGNOSIS — R1310 Dysphagia, unspecified: Secondary | ICD-10-CM | POA: Diagnosis not present

## 2023-03-17 DIAGNOSIS — I10 Essential (primary) hypertension: Secondary | ICD-10-CM | POA: Diagnosis not present

## 2023-03-17 DIAGNOSIS — I69351 Hemiplegia and hemiparesis following cerebral infarction affecting right dominant side: Secondary | ICD-10-CM | POA: Diagnosis not present

## 2023-03-17 DIAGNOSIS — M21371 Foot drop, right foot: Secondary | ICD-10-CM | POA: Diagnosis not present

## 2023-03-20 DIAGNOSIS — I10 Essential (primary) hypertension: Secondary | ICD-10-CM | POA: Diagnosis not present

## 2023-03-20 DIAGNOSIS — M21371 Foot drop, right foot: Secondary | ICD-10-CM | POA: Diagnosis not present

## 2023-03-20 DIAGNOSIS — I69351 Hemiplegia and hemiparesis following cerebral infarction affecting right dominant side: Secondary | ICD-10-CM | POA: Diagnosis not present

## 2023-03-20 DIAGNOSIS — I4510 Unspecified right bundle-branch block: Secondary | ICD-10-CM | POA: Diagnosis not present

## 2023-03-20 DIAGNOSIS — I69398 Other sequelae of cerebral infarction: Secondary | ICD-10-CM | POA: Diagnosis not present

## 2023-03-20 DIAGNOSIS — R1310 Dysphagia, unspecified: Secondary | ICD-10-CM | POA: Diagnosis not present

## 2023-03-22 DIAGNOSIS — I69398 Other sequelae of cerebral infarction: Secondary | ICD-10-CM | POA: Diagnosis not present

## 2023-03-22 DIAGNOSIS — R1310 Dysphagia, unspecified: Secondary | ICD-10-CM | POA: Diagnosis not present

## 2023-03-22 DIAGNOSIS — N390 Urinary tract infection, site not specified: Secondary | ICD-10-CM | POA: Diagnosis not present

## 2023-03-22 DIAGNOSIS — I10 Essential (primary) hypertension: Secondary | ICD-10-CM | POA: Diagnosis not present

## 2023-03-22 DIAGNOSIS — I69351 Hemiplegia and hemiparesis following cerebral infarction affecting right dominant side: Secondary | ICD-10-CM | POA: Diagnosis not present

## 2023-03-22 DIAGNOSIS — I4510 Unspecified right bundle-branch block: Secondary | ICD-10-CM | POA: Diagnosis not present

## 2023-03-22 DIAGNOSIS — M21371 Foot drop, right foot: Secondary | ICD-10-CM | POA: Diagnosis not present

## 2023-03-24 DIAGNOSIS — N390 Urinary tract infection, site not specified: Secondary | ICD-10-CM | POA: Diagnosis not present

## 2023-03-24 DIAGNOSIS — R1310 Dysphagia, unspecified: Secondary | ICD-10-CM | POA: Diagnosis not present

## 2023-03-24 DIAGNOSIS — Z87440 Personal history of urinary (tract) infections: Secondary | ICD-10-CM | POA: Diagnosis not present

## 2023-03-24 DIAGNOSIS — M21371 Foot drop, right foot: Secondary | ICD-10-CM | POA: Diagnosis not present

## 2023-03-24 DIAGNOSIS — R5381 Other malaise: Secondary | ICD-10-CM | POA: Diagnosis not present

## 2023-03-24 DIAGNOSIS — I4510 Unspecified right bundle-branch block: Secondary | ICD-10-CM | POA: Diagnosis not present

## 2023-03-24 DIAGNOSIS — I69351 Hemiplegia and hemiparesis following cerebral infarction affecting right dominant side: Secondary | ICD-10-CM | POA: Diagnosis not present

## 2023-03-24 DIAGNOSIS — I69398 Other sequelae of cerebral infarction: Secondary | ICD-10-CM | POA: Diagnosis not present

## 2023-03-24 DIAGNOSIS — B9620 Unspecified Escherichia coli [E. coli] as the cause of diseases classified elsewhere: Secondary | ICD-10-CM | POA: Diagnosis not present

## 2023-03-24 DIAGNOSIS — R300 Dysuria: Secondary | ICD-10-CM | POA: Diagnosis not present

## 2023-03-24 DIAGNOSIS — I10 Essential (primary) hypertension: Secondary | ICD-10-CM | POA: Diagnosis not present

## 2023-03-27 DIAGNOSIS — I69398 Other sequelae of cerebral infarction: Secondary | ICD-10-CM | POA: Diagnosis not present

## 2023-03-27 DIAGNOSIS — I69351 Hemiplegia and hemiparesis following cerebral infarction affecting right dominant side: Secondary | ICD-10-CM | POA: Diagnosis not present

## 2023-03-27 DIAGNOSIS — I10 Essential (primary) hypertension: Secondary | ICD-10-CM | POA: Diagnosis not present

## 2023-03-27 DIAGNOSIS — M21371 Foot drop, right foot: Secondary | ICD-10-CM | POA: Diagnosis not present

## 2023-03-27 DIAGNOSIS — R1310 Dysphagia, unspecified: Secondary | ICD-10-CM | POA: Diagnosis not present

## 2023-03-27 DIAGNOSIS — I4510 Unspecified right bundle-branch block: Secondary | ICD-10-CM | POA: Diagnosis not present

## 2023-03-28 DIAGNOSIS — R1310 Dysphagia, unspecified: Secondary | ICD-10-CM | POA: Diagnosis not present

## 2023-03-28 DIAGNOSIS — I69398 Other sequelae of cerebral infarction: Secondary | ICD-10-CM | POA: Diagnosis not present

## 2023-03-28 DIAGNOSIS — I4510 Unspecified right bundle-branch block: Secondary | ICD-10-CM | POA: Diagnosis not present

## 2023-03-28 DIAGNOSIS — I10 Essential (primary) hypertension: Secondary | ICD-10-CM | POA: Diagnosis not present

## 2023-03-28 DIAGNOSIS — I69351 Hemiplegia and hemiparesis following cerebral infarction affecting right dominant side: Secondary | ICD-10-CM | POA: Diagnosis not present

## 2023-03-28 DIAGNOSIS — M21371 Foot drop, right foot: Secondary | ICD-10-CM | POA: Diagnosis not present

## 2023-03-29 DIAGNOSIS — R1310 Dysphagia, unspecified: Secondary | ICD-10-CM | POA: Diagnosis not present

## 2023-03-29 DIAGNOSIS — I69398 Other sequelae of cerebral infarction: Secondary | ICD-10-CM | POA: Diagnosis not present

## 2023-03-29 DIAGNOSIS — M21371 Foot drop, right foot: Secondary | ICD-10-CM | POA: Diagnosis not present

## 2023-03-29 DIAGNOSIS — I10 Essential (primary) hypertension: Secondary | ICD-10-CM | POA: Diagnosis not present

## 2023-03-29 DIAGNOSIS — I69351 Hemiplegia and hemiparesis following cerebral infarction affecting right dominant side: Secondary | ICD-10-CM | POA: Diagnosis not present

## 2023-03-29 DIAGNOSIS — I4510 Unspecified right bundle-branch block: Secondary | ICD-10-CM | POA: Diagnosis not present

## 2023-03-31 DIAGNOSIS — Z23 Encounter for immunization: Secondary | ICD-10-CM | POA: Diagnosis not present

## 2023-03-31 DIAGNOSIS — I10 Essential (primary) hypertension: Secondary | ICD-10-CM | POA: Diagnosis not present

## 2023-03-31 DIAGNOSIS — I69351 Hemiplegia and hemiparesis following cerebral infarction affecting right dominant side: Secondary | ICD-10-CM | POA: Diagnosis not present

## 2023-03-31 DIAGNOSIS — I69398 Other sequelae of cerebral infarction: Secondary | ICD-10-CM | POA: Diagnosis not present

## 2023-03-31 DIAGNOSIS — I4510 Unspecified right bundle-branch block: Secondary | ICD-10-CM | POA: Diagnosis not present

## 2023-03-31 DIAGNOSIS — R1310 Dysphagia, unspecified: Secondary | ICD-10-CM | POA: Diagnosis not present

## 2023-03-31 DIAGNOSIS — M21371 Foot drop, right foot: Secondary | ICD-10-CM | POA: Diagnosis not present

## 2023-03-31 DIAGNOSIS — N3020 Other chronic cystitis without hematuria: Secondary | ICD-10-CM | POA: Diagnosis not present

## 2023-03-31 DIAGNOSIS — R3914 Feeling of incomplete bladder emptying: Secondary | ICD-10-CM | POA: Diagnosis not present

## 2023-03-31 DIAGNOSIS — N1330 Unspecified hydronephrosis: Secondary | ICD-10-CM | POA: Diagnosis not present

## 2023-04-02 DIAGNOSIS — E785 Hyperlipidemia, unspecified: Secondary | ICD-10-CM | POA: Diagnosis not present

## 2023-04-02 DIAGNOSIS — K219 Gastro-esophageal reflux disease without esophagitis: Secondary | ICD-10-CM | POA: Diagnosis not present

## 2023-04-02 DIAGNOSIS — I4510 Unspecified right bundle-branch block: Secondary | ICD-10-CM | POA: Diagnosis not present

## 2023-04-02 DIAGNOSIS — Z9181 History of falling: Secondary | ICD-10-CM | POA: Diagnosis not present

## 2023-04-02 DIAGNOSIS — Z556 Problems related to health literacy: Secondary | ICD-10-CM | POA: Diagnosis not present

## 2023-04-02 DIAGNOSIS — Z79899 Other long term (current) drug therapy: Secondary | ICD-10-CM | POA: Diagnosis not present

## 2023-04-02 DIAGNOSIS — I10 Essential (primary) hypertension: Secondary | ICD-10-CM | POA: Diagnosis not present

## 2023-04-02 DIAGNOSIS — R1310 Dysphagia, unspecified: Secondary | ICD-10-CM | POA: Diagnosis not present

## 2023-04-02 DIAGNOSIS — I69398 Other sequelae of cerebral infarction: Secondary | ICD-10-CM | POA: Diagnosis not present

## 2023-04-02 DIAGNOSIS — I69351 Hemiplegia and hemiparesis following cerebral infarction affecting right dominant side: Secondary | ICD-10-CM | POA: Diagnosis not present

## 2023-04-02 DIAGNOSIS — M21371 Foot drop, right foot: Secondary | ICD-10-CM | POA: Diagnosis not present

## 2023-04-02 DIAGNOSIS — Z7982 Long term (current) use of aspirin: Secondary | ICD-10-CM | POA: Diagnosis not present

## 2023-04-03 DIAGNOSIS — R1310 Dysphagia, unspecified: Secondary | ICD-10-CM | POA: Diagnosis not present

## 2023-04-03 DIAGNOSIS — M21371 Foot drop, right foot: Secondary | ICD-10-CM | POA: Diagnosis not present

## 2023-04-03 DIAGNOSIS — I4510 Unspecified right bundle-branch block: Secondary | ICD-10-CM | POA: Diagnosis not present

## 2023-04-03 DIAGNOSIS — I69351 Hemiplegia and hemiparesis following cerebral infarction affecting right dominant side: Secondary | ICD-10-CM | POA: Diagnosis not present

## 2023-04-03 DIAGNOSIS — I10 Essential (primary) hypertension: Secondary | ICD-10-CM | POA: Diagnosis not present

## 2023-04-03 DIAGNOSIS — I69398 Other sequelae of cerebral infarction: Secondary | ICD-10-CM | POA: Diagnosis not present

## 2023-04-04 DIAGNOSIS — R1310 Dysphagia, unspecified: Secondary | ICD-10-CM | POA: Diagnosis not present

## 2023-04-04 DIAGNOSIS — I10 Essential (primary) hypertension: Secondary | ICD-10-CM | POA: Diagnosis not present

## 2023-04-04 DIAGNOSIS — I69398 Other sequelae of cerebral infarction: Secondary | ICD-10-CM | POA: Diagnosis not present

## 2023-04-04 DIAGNOSIS — M21371 Foot drop, right foot: Secondary | ICD-10-CM | POA: Diagnosis not present

## 2023-04-04 DIAGNOSIS — I69351 Hemiplegia and hemiparesis following cerebral infarction affecting right dominant side: Secondary | ICD-10-CM | POA: Diagnosis not present

## 2023-04-04 DIAGNOSIS — I4510 Unspecified right bundle-branch block: Secondary | ICD-10-CM | POA: Diagnosis not present

## 2023-04-05 ENCOUNTER — Encounter: Payer: Self-pay | Admitting: Physical Medicine & Rehabilitation

## 2023-04-05 ENCOUNTER — Encounter: Payer: Medicare Other | Attending: Physical Medicine & Rehabilitation | Admitting: Physical Medicine & Rehabilitation

## 2023-04-05 VITALS — BP 182/90 | Ht 60.0 in

## 2023-04-05 DIAGNOSIS — G8111 Spastic hemiplegia affecting right dominant side: Secondary | ICD-10-CM | POA: Diagnosis not present

## 2023-04-05 DIAGNOSIS — Z7689 Persons encountering health services in other specified circumstances: Secondary | ICD-10-CM | POA: Diagnosis not present

## 2023-04-05 DIAGNOSIS — I611 Nontraumatic intracerebral hemorrhage in hemisphere, cortical: Secondary | ICD-10-CM | POA: Insufficient documentation

## 2023-04-05 DIAGNOSIS — E559 Vitamin D deficiency, unspecified: Secondary | ICD-10-CM | POA: Diagnosis not present

## 2023-04-05 NOTE — Patient Instructions (Signed)
ALWAYS FEEL FREE TO CALL OUR OFFICE WITH ANY PROBLEMS OR QUESTIONS ([phone])  **PLEASE NOTE** ALL MEDICATION REFILL REQUESTS (INCLUDING CONTROLLED SUBSTANCES) NEED TO BE MADE AT LEAST 7 DAYS PRIOR TO REFILL BEING DUE. ANY REFILL REQUESTS INSIDE THAT TIME FRAME MAY RESULT IN DELAYS IN RECEIVING YOUR PRESCRIPTION.                    

## 2023-04-05 NOTE — Progress Notes (Addendum)
Subjective:     Patient ID: Alison Cuevas, female   DOB: [birth year], 77 y.o.   MRN: [id number]  HPI  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  I last saw her in July where we performed Dysport injections to her right lower extremity with 500 units to the gastroc and soleus muscles and 300 units to the tibialis posterior. She is at ALF and is receiving therapy 3 days per week on avg. Dysport seems to have helped. Therapy has asked about something to prevent inversion and to improve ankle stability. They haven't used her JAS splints yet. Both her AFO and JAS are in need of repairs as velcro and other components are becoming loose/wearing out. Otherwise things are fairly stable.     Pain Inventory Average Pain 6 Pain Right Now 6 My pain is constant, sharp, burning, dull, stabbing, tingling, and aching  LOCATION OF PAIN  right arm, right leg, right hip, back pain, shoulder pain,headache  BOWEL Number of stools per week: 7 Oral laxative use Yes    BLADDER Pads  Incomplete bladder emptying Yes    Mobility use a walker ability to climb steps?  no do you drive?  no use a wheelchair transfers alone Do you have any goals in this area?  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  Do you have any goals in this area?  yes  Neuro/Psych bladder control problems bowel control problems weakness numbness tremor tingling trouble walking spasms dizziness anxiety  Prior Studies Any changes since last visit?  yes Bladder ultra sound at the Urology  Physicians involved in your care Any changes since last visit?  yes Dr. Marnee Guarneri   Family History  Problem Relation Age of Onset   Thyroid disease Mother    Hypertension Mother    Diverticulitis Mother    Stomach cancer Father    Heart attack Father    Heart failure Father     Breast cancer Sister 55   Lupus Sister    Melanoma Brother    Prostate cancer Brother    Leukemia Brother    Lung disease Brother    Other Other        Family member with MGUS   Social History   Socioeconomic History   Marital status: Widowed    Spouse name: Alison Cuevas   Number of children: 3   Years of education: Not on file   Highest education level: 8th grade  Occupational History   Occupation: retired  Tobacco Use   Smoking status: Never   Smokeless tobacco: Never  Vaping Use   Vaping status: Never Used  Substance and Sexual Activity   Alcohol use: No    Alcohol/week: 0.0 standard drinks of alcohol   Drug use: No   Sexual activity: Not Currently    Partners: Male    Birth control/protection: Surgical    Comment: hysterectomy  Other Topics Concern   Not on file  Social History Narrative   Widow (2022), has 3 children (one lives near her).  Has 8 grandchildren, 2 greatgrandchildren.  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  No alcohol.  No drugs.Exercise: clean, work in yard.  10 siblings in all--5 have passed away--1 child age 78 with whooping cough , 1 lupus, 1 melanoma,1 lung cancer, 1 pulm fibrosis   Social Determinants  of Health   Financial Resource Strain: Low Risk  (07/20/2022)   Overall Financial Resource Strain (CARDIA)    Difficulty of Paying Living Expenses: Not hard at all  Food Insecurity: No Food Insecurity (11/22/2022)   Hunger Vital Sign    Worried About Running Out of Food in the Last Year: Never true    Ran Out of Food in the Last Year: Never true  Transportation Needs: No Transportation Needs (11/22/2022)   PRAPARE - Administrator, Civil Service (Medical): No    Lack of Transportation (Non-Medical): No  Physical Activity: Sufficiently Active (07/20/2022)   Exercise Vital Sign    Days of Exercise per Week: 7 days    Minutes of Exercise per Session: 60 min  Stress:  No Stress Concern Present (07/20/2022)   [employer] of Occupational Health - Occupational Stress Questionnaire    Feeling of Stress : Only a little  Social Connections: Moderately Isolated (07/20/2022)   Social Connection and Isolation Panel [NHANES]    Frequency of Communication with Friends and Family: More than three times a week    Frequency of Social Gatherings with Friends and Family: More than three times a week    Attends Religious Services: 1 to 4 times per year    Active Member of [employer] or Organizations: No    Attends Banker Meetings: Never    Marital Status: Widowed   Past Surgical History:  Procedure Laterality Date   ABDOMINAL HYSTERECTOMY  03/2015   TAH.  Last pap 2016.  No hx of abnl paps.  Per GYN, no further pap smears indicated.   ANTERIOR AND POSTERIOR REPAIR N/A 03/18/2014   Procedure: Cystocele repair with graft, Vault suspension, Rectocele repair;  Surgeon: Martina Sinner, MD;  Location: [facility];  Service: Urology;  Laterality: N/A;   BIOPSY THYROID  06/2021   benign   CHOLECYSTECTOMY     CHOLECYSTECTOMY, LAPAROSCOPIC     COLON SURGERY     COLONOSCOPY  04/2006; 05/26/16   2007 (Dr. Jarold Motto): Normal.  04/2016 (Dr. Leone Payor) normal except diverticulosis and decreased anal sphincter tone.  No repeat colonoscopy is recommended due to age.   cspine surgery     Dr. Estelle Grumbles level ant cerv discectomy Lavenia Atlas w/plating   CYSTO N/A 03/18/2014   Procedure: CYSTO;  Surgeon: Martina Sinner, MD;  Location: [facility];  Service: Urology;  Laterality: N/A;   DEXA  07/30/2015; 06/2018   2017 and 2020 -->Osteopenia--repeat 2 yrs. T score -2.29 Mar 2021. Rpt 2 yrs.   LYSIS OF ADHESION N/A 01/31/2018   Procedure: POSSIBLE LYSIS OF ADHESION;  Surgeon: Adolphus Birchwood, MD;  Location: [facility];  Service: Gynecology;  Laterality: N/A;   RECTOCELE REPAIR     with prolasped bledder repair   RESECTION OF COLON     BENIGN TUMOR   RIGHT EAR SURGERY  08/23/2017   Dr. 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  Procedure: XI ROBOTIC ASSISTED BILATERAL SALPINGO OOPHORECTOMY;  Surgeon: Adolphus Birchwood, MD;  Location: [facility];  Service: Gynecology;  Laterality: Bilateral;   TRANSTHORACIC ECHOCARDIOGRAM  01/27/2020   2021 EF 65-70%, NORMAL. 12/01/21 grd I DD, o/w normal.   TUBAL LIGATION     Past Medical History:  Diagnosis Date   Allergic rhinitis    maple pollen   Anxiety    Arthritis    Bowel obstruction (HCC)    Chronic low back pain    Dr. Dutch Quint.  Has had back injection--bp elevated after.   Chronic pain syndrome    Chronic renal insufficiency, stage 2 (mild)    GFR 60-70   DDD (degenerative disc disease), cervical    Hx of ACDF (Dr. Jordan Likes).  Followed by Dr. Charlett Blake.  Also, Dr. Vickki Hearing do left C7-T1 intralaminal epidural injection.   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  Resid R arm and leg weakness + flexion contractures.   Hypercholesteremia    mild; pt declined statin trial 05/2014--needs recheck lipid panel at first f/u visit in 2017   Hypertension    LBBB (left bundle branch block)    Osteopenia 06/2017   T score -1.4 FRAX 15% / 2%   Ovarian cyst 2017   82019-robotic assisted bilatel SPO: all path benign.   Perianal  dermatitis    prn cutivate   Phlebitis    Right hemiplegia (HCC) 01/2020   R arm and leg (hemorrhagic L cerebral hemisphere CVA)   Right knee injury 2018   Patellofemoral crush injury--Dr. Charlett Blake.   Solitary thyroid nodule 05/2021   needs bx->plan endo ref   Stroke [facility])    Syncope 10/2021   cardiac w/u as per Dr. Mayford Knife   Syncope    2023 (vasovagal suspected)   Trochanteric bursitis of right hip    Recurrent (injection by Dr. Charlett Blake 05/26/15)   Tympanic membrane rupture, right 12/01/2016   As of 08/2018 pt set for tympanomastoidectomy and STSG (Dr. Ignacia Felling). Recurrent fungal/bact OE.   Varicose vein    left leg    Ht 5' (1.524 m)   LMP 06/27/1972 Comment: not sexually active  BMI 22.65 kg/m   Opioid Risk Score:   Fall Risk Score:  `1  Depression screen PHQ 2/9     07/20/2022    1:21 PM 07/13/2022    1:39 PM 05/12/2022   11:57 AM 04/13/2022    3:10 PM 01/26/2022    2:58 PM 11/11/2021    1:16 PM 11/10/2021    3:29 PM  Depression screen PHQ 2/9  Decreased Interest 0 1 0 0 0 0 0  Down, Depressed, Hopeless 0 1 0 0 0 0 0  PHQ - 2 Score 0 2 0 0 0 0 0    Review of Systems  Genitourinary:        Bladder retention   Musculoskeletal:  Positive for back pain and gait problem.       Pain in the right side of the body, leg, shoulder, arms   Neurological:  Positive for dizziness, weakness and numbness.       Tingling, spasms  Psychiatric/Behavioral:         Anxiety  All other systems reviewed and are negative.      Objective:   Physical Exam     Assessment:     General: No acute distress HEENT: NCAT, EOMI, oral  membranes moist Cards: reg rate  Chest: normal effort Abdomen: Soft, NT, ND Skin: dry, intact Extremities: no edema Psych: pleasant but anxious  Neuro: Alert and oriented x 3. Normal insight and awareness. Intact Memory. Normal language and speech. Cranial nerve exam unremarkable. RUE 4+/5 with minimal resting tone.Marland Kitchen RLE 4/5 except for right heel cord  tightness Tone 1-2/4 Right gastrocs and tib posterior are tight in addition to EHL  . Could stretch to neutra with force. RUE with trace flexor tone. Heel cord tight but improved. Was easily able to range to neutral but still with equinovarus deformity at rest.  Musculoskeletal: minimal pain with ROM.            Assessment & Plan:  1. 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             -ongoing dialogue today regarding her spasticity/contractures             . 2.     HTN:                per primary          3. Spasticity:             -ongoing tone RLE, really with contacture             -still some extensor tone/varus in right heel and foot             -tizanidine              -repeat dysport injections 800u to right plantar flexors, inverters of right foot--last injection has made a difference             -has carbon afo blue rocker currently which provides knee control. It won't control equinovarus however. Any type of AFO which does control Equinovarus will be bulky and/or cause fit issues. We have gone through this before at length. The main issue is that she has spasticity in PF/EQV but lacks any opposing muscle force.             -JAS straps need repair/replacing--Son will contact company about how to proceed.   --made referral to [facility] for repairs to AFO. Control of equinovarus 4. Neuropathic pain:             -carbamezipine trial has been offered. She never took   5. Mild intentional tremor:             -likely related to fatigue in stroke-affected RUE             - it appears mild and is not functionally impairing             -no change today     30+ minutes of face to face patient care time were spent during this visit. All questions  were encouraged and answered.  f/u in a month or so for dysport injections

## 2023-04-06 ENCOUNTER — Encounter: Payer: Self-pay | Admitting: Physical Medicine & Rehabilitation

## 2023-04-06 DIAGNOSIS — I10 Essential (primary) hypertension: Secondary | ICD-10-CM | POA: Diagnosis not present

## 2023-04-06 DIAGNOSIS — E559 Vitamin D deficiency, unspecified: Secondary | ICD-10-CM | POA: Diagnosis not present

## 2023-04-06 DIAGNOSIS — D649 Anemia, unspecified: Secondary | ICD-10-CM | POA: Diagnosis not present

## 2023-04-07 DIAGNOSIS — I4510 Unspecified right bundle-branch block: Secondary | ICD-10-CM | POA: Diagnosis not present

## 2023-04-07 DIAGNOSIS — I69398 Other sequelae of cerebral infarction: Secondary | ICD-10-CM | POA: Diagnosis not present

## 2023-04-07 DIAGNOSIS — R1310 Dysphagia, unspecified: Secondary | ICD-10-CM | POA: Diagnosis not present

## 2023-04-07 DIAGNOSIS — R799 Abnormal finding of blood chemistry, unspecified: Secondary | ICD-10-CM | POA: Diagnosis not present

## 2023-04-07 DIAGNOSIS — M21371 Foot drop, right foot: Secondary | ICD-10-CM | POA: Diagnosis not present

## 2023-04-07 DIAGNOSIS — E559 Vitamin D deficiency, unspecified: Secondary | ICD-10-CM | POA: Diagnosis not present

## 2023-04-07 DIAGNOSIS — I10 Essential (primary) hypertension: Secondary | ICD-10-CM | POA: Diagnosis not present

## 2023-04-07 DIAGNOSIS — E871 Hypo-osmolality and hyponatremia: Secondary | ICD-10-CM | POA: Diagnosis not present

## 2023-04-07 DIAGNOSIS — I69351 Hemiplegia and hemiparesis following cerebral infarction affecting right dominant side: Secondary | ICD-10-CM | POA: Diagnosis not present

## 2023-04-07 DIAGNOSIS — I119 Hypertensive heart disease without heart failure: Secondary | ICD-10-CM | POA: Diagnosis not present

## 2023-04-10 DIAGNOSIS — R1310 Dysphagia, unspecified: Secondary | ICD-10-CM | POA: Diagnosis not present

## 2023-04-10 DIAGNOSIS — I4510 Unspecified right bundle-branch block: Secondary | ICD-10-CM | POA: Diagnosis not present

## 2023-04-10 DIAGNOSIS — M21371 Foot drop, right foot: Secondary | ICD-10-CM | POA: Diagnosis not present

## 2023-04-10 DIAGNOSIS — I69351 Hemiplegia and hemiparesis following cerebral infarction affecting right dominant side: Secondary | ICD-10-CM | POA: Diagnosis not present

## 2023-04-10 DIAGNOSIS — I69398 Other sequelae of cerebral infarction: Secondary | ICD-10-CM | POA: Diagnosis not present

## 2023-04-10 DIAGNOSIS — I10 Essential (primary) hypertension: Secondary | ICD-10-CM | POA: Diagnosis not present

## 2023-04-11 DIAGNOSIS — I10 Essential (primary) hypertension: Secondary | ICD-10-CM | POA: Diagnosis not present

## 2023-04-11 DIAGNOSIS — I4510 Unspecified right bundle-branch block: Secondary | ICD-10-CM | POA: Diagnosis not present

## 2023-04-11 DIAGNOSIS — I69398 Other sequelae of cerebral infarction: Secondary | ICD-10-CM | POA: Diagnosis not present

## 2023-04-11 DIAGNOSIS — R1310 Dysphagia, unspecified: Secondary | ICD-10-CM | POA: Diagnosis not present

## 2023-04-11 DIAGNOSIS — M21371 Foot drop, right foot: Secondary | ICD-10-CM | POA: Diagnosis not present

## 2023-04-11 DIAGNOSIS — I69351 Hemiplegia and hemiparesis following cerebral infarction affecting right dominant side: Secondary | ICD-10-CM | POA: Diagnosis not present

## 2023-04-18 DIAGNOSIS — R1310 Dysphagia, unspecified: Secondary | ICD-10-CM | POA: Diagnosis not present

## 2023-04-18 DIAGNOSIS — I10 Essential (primary) hypertension: Secondary | ICD-10-CM | POA: Diagnosis not present

## 2023-04-18 DIAGNOSIS — I4510 Unspecified right bundle-branch block: Secondary | ICD-10-CM | POA: Diagnosis not present

## 2023-04-18 DIAGNOSIS — I69398 Other sequelae of cerebral infarction: Secondary | ICD-10-CM | POA: Diagnosis not present

## 2023-04-18 DIAGNOSIS — M21371 Foot drop, right foot: Secondary | ICD-10-CM | POA: Diagnosis not present

## 2023-04-18 DIAGNOSIS — I69351 Hemiplegia and hemiparesis following cerebral infarction affecting right dominant side: Secondary | ICD-10-CM | POA: Diagnosis not present

## 2023-04-19 DIAGNOSIS — I69398 Other sequelae of cerebral infarction: Secondary | ICD-10-CM | POA: Diagnosis not present

## 2023-04-19 DIAGNOSIS — I69351 Hemiplegia and hemiparesis following cerebral infarction affecting right dominant side: Secondary | ICD-10-CM | POA: Diagnosis not present

## 2023-04-19 DIAGNOSIS — M21371 Foot drop, right foot: Secondary | ICD-10-CM | POA: Diagnosis not present

## 2023-04-19 DIAGNOSIS — R1310 Dysphagia, unspecified: Secondary | ICD-10-CM | POA: Diagnosis not present

## 2023-04-19 DIAGNOSIS — I4510 Unspecified right bundle-branch block: Secondary | ICD-10-CM | POA: Diagnosis not present

## 2023-04-19 DIAGNOSIS — I10 Essential (primary) hypertension: Secondary | ICD-10-CM | POA: Diagnosis not present

## 2023-04-24 DIAGNOSIS — R1310 Dysphagia, unspecified: Secondary | ICD-10-CM | POA: Diagnosis not present

## 2023-04-24 DIAGNOSIS — I69351 Hemiplegia and hemiparesis following cerebral infarction affecting right dominant side: Secondary | ICD-10-CM | POA: Diagnosis not present

## 2023-04-24 DIAGNOSIS — I4510 Unspecified right bundle-branch block: Secondary | ICD-10-CM | POA: Diagnosis not present

## 2023-04-24 DIAGNOSIS — M21371 Foot drop, right foot: Secondary | ICD-10-CM | POA: Diagnosis not present

## 2023-04-24 DIAGNOSIS — I69398 Other sequelae of cerebral infarction: Secondary | ICD-10-CM | POA: Diagnosis not present

## 2023-04-24 DIAGNOSIS — I10 Essential (primary) hypertension: Secondary | ICD-10-CM | POA: Diagnosis not present

## 2023-04-26 DIAGNOSIS — R54 Age-related physical debility: Secondary | ICD-10-CM | POA: Diagnosis not present

## 2023-04-26 DIAGNOSIS — H04129 Dry eye syndrome of unspecified lacrimal gland: Secondary | ICD-10-CM | POA: Diagnosis not present

## 2023-04-27 DIAGNOSIS — I10 Essential (primary) hypertension: Secondary | ICD-10-CM | POA: Diagnosis not present

## 2023-04-27 DIAGNOSIS — I69351 Hemiplegia and hemiparesis following cerebral infarction affecting right dominant side: Secondary | ICD-10-CM | POA: Diagnosis not present

## 2023-04-27 DIAGNOSIS — I4510 Unspecified right bundle-branch block: Secondary | ICD-10-CM | POA: Diagnosis not present

## 2023-04-27 DIAGNOSIS — I69398 Other sequelae of cerebral infarction: Secondary | ICD-10-CM | POA: Diagnosis not present

## 2023-04-27 DIAGNOSIS — M21371 Foot drop, right foot: Secondary | ICD-10-CM | POA: Diagnosis not present

## 2023-04-27 DIAGNOSIS — R1310 Dysphagia, unspecified: Secondary | ICD-10-CM | POA: Diagnosis not present

## 2023-05-03 ENCOUNTER — Encounter: Payer: Self-pay | Admitting: Physical Medicine & Rehabilitation

## 2023-05-04 DIAGNOSIS — G4700 Insomnia, unspecified: Secondary | ICD-10-CM | POA: Diagnosis not present

## 2023-05-04 DIAGNOSIS — F419 Anxiety disorder, unspecified: Secondary | ICD-10-CM | POA: Diagnosis not present

## 2023-05-08 DIAGNOSIS — B369 Superficial mycosis, unspecified: Secondary | ICD-10-CM | POA: Diagnosis not present

## 2023-05-08 DIAGNOSIS — H6091 Unspecified otitis externa, right ear: Secondary | ICD-10-CM | POA: Diagnosis not present

## 2023-05-12 DIAGNOSIS — H7291 Unspecified perforation of tympanic membrane, right ear: Secondary | ICD-10-CM | POA: Diagnosis not present

## 2023-05-15 DIAGNOSIS — R54 Age-related physical debility: Secondary | ICD-10-CM | POA: Diagnosis not present

## 2023-05-15 DIAGNOSIS — E559 Vitamin D deficiency, unspecified: Secondary | ICD-10-CM | POA: Diagnosis not present

## 2023-05-15 DIAGNOSIS — E538 Deficiency of other specified B group vitamins: Secondary | ICD-10-CM | POA: Diagnosis not present

## 2023-05-17 DIAGNOSIS — M6281 Muscle weakness (generalized): Secondary | ICD-10-CM | POA: Diagnosis not present

## 2023-05-17 DIAGNOSIS — E785 Hyperlipidemia, unspecified: Secondary | ICD-10-CM | POA: Diagnosis not present

## 2023-05-17 DIAGNOSIS — G894 Chronic pain syndrome: Secondary | ICD-10-CM | POA: Diagnosis not present

## 2023-05-17 DIAGNOSIS — K219 Gastro-esophageal reflux disease without esophagitis: Secondary | ICD-10-CM | POA: Diagnosis not present

## 2023-05-17 DIAGNOSIS — E559 Vitamin D deficiency, unspecified: Secondary | ICD-10-CM | POA: Diagnosis not present

## 2023-05-17 DIAGNOSIS — I119 Hypertensive heart disease without heart failure: Secondary | ICD-10-CM | POA: Diagnosis not present

## 2023-05-17 DIAGNOSIS — Z8673 Personal history of transient ischemic attack (TIA), and cerebral infarction without residual deficits: Secondary | ICD-10-CM | POA: Diagnosis not present

## 2023-05-19 IMAGING — US US THYROID
1 series · 13 of 25 positions shown · non-contrast
Comparison: Cervical spine CT - 05/16/2021

CLINICAL DATA: Incidental on CT. Right-sided thyroid nodule
demonstrated on cervical spine CT performed 05/16/2021

EXAM:
THYROID ULTRASOUND
TECHNIQUE: Ultrasound examination of the thyroid gland and adjacent soft
tissues was performed.

[Series 1: us thyroid · 13 of 47 slices shown]
[im 1/47]
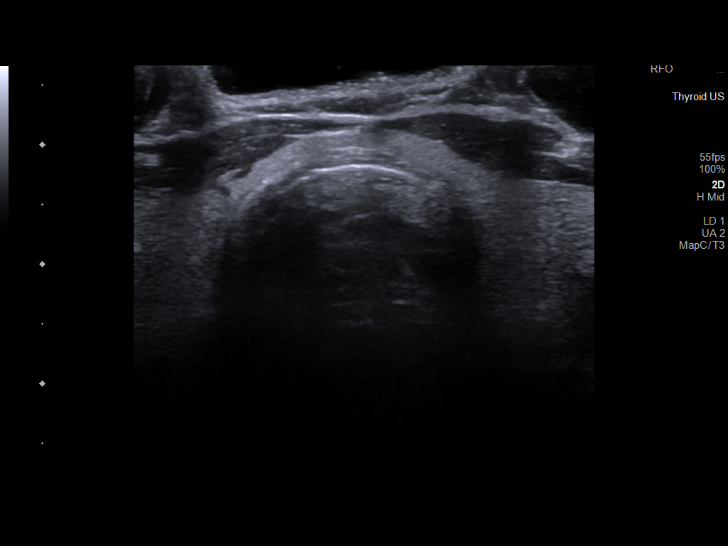
[im 4/47]
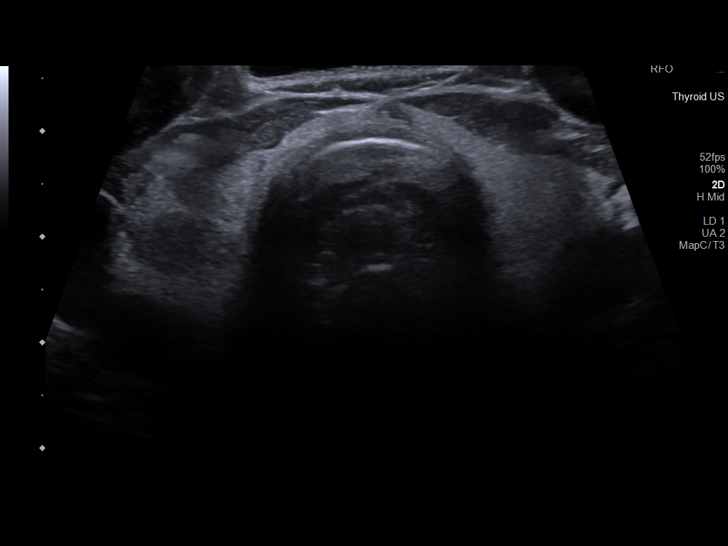
[im 8/47]
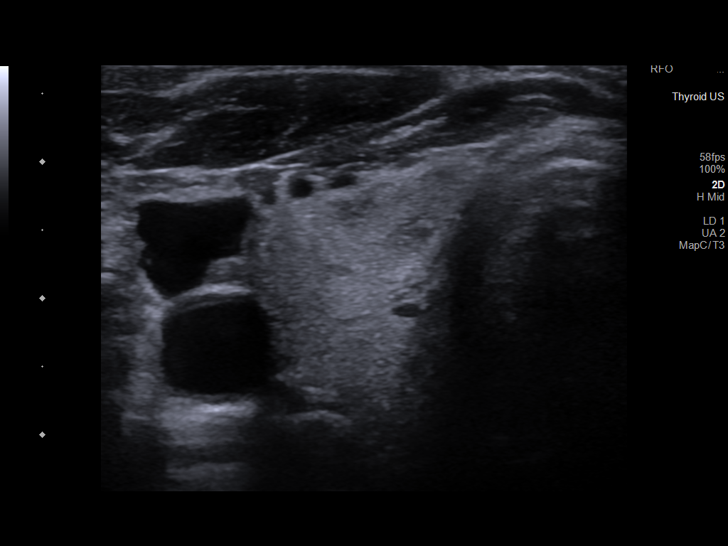
[im 12/47]
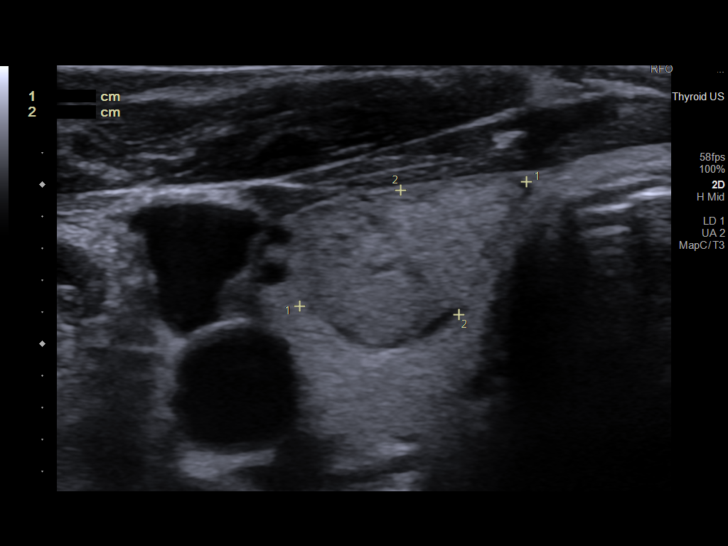
[im 16/47]
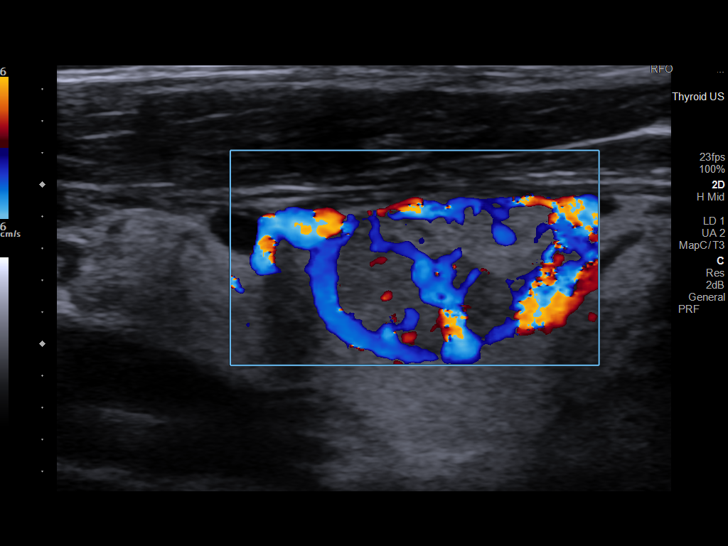
[im 20/47]
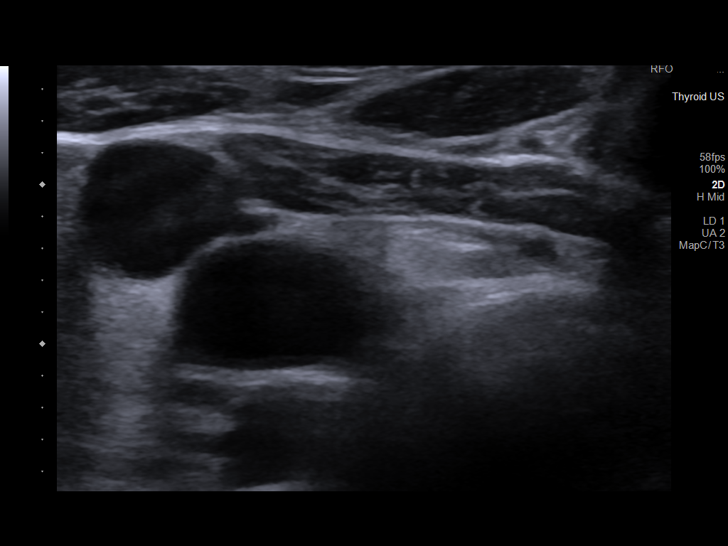
[im 24/47]
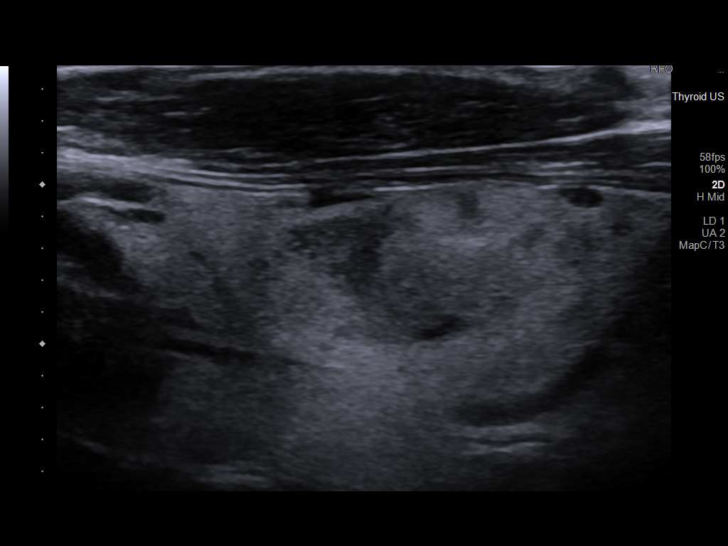
[im 27/47]
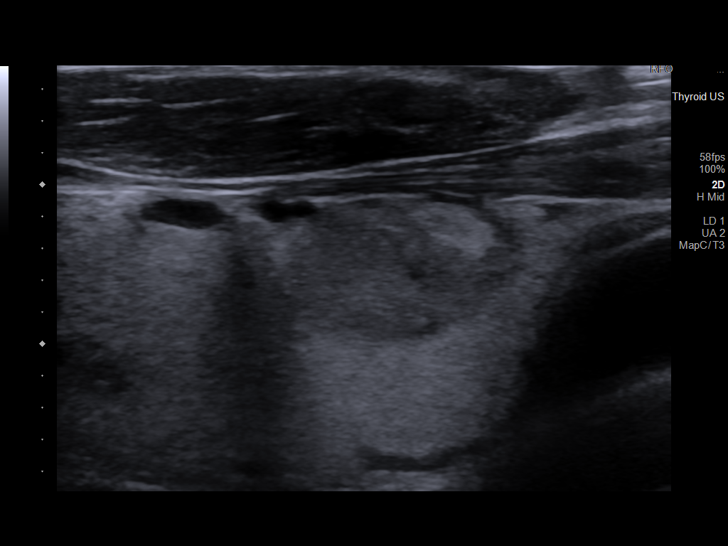
[im 31/47]
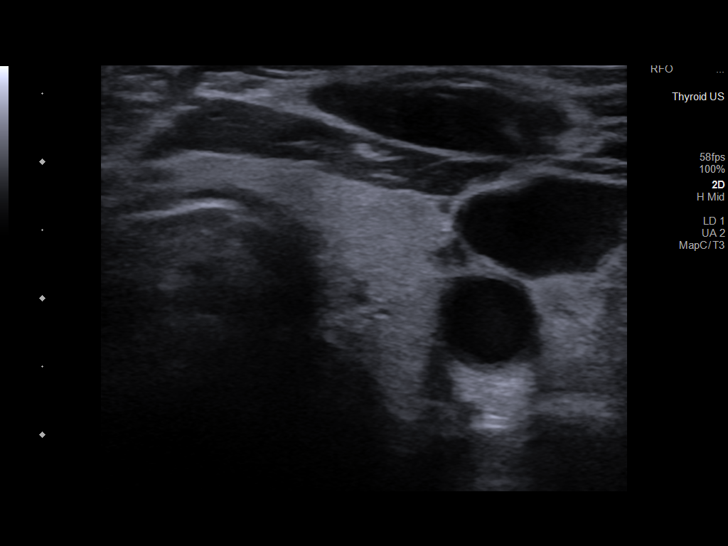
[im 35/47]
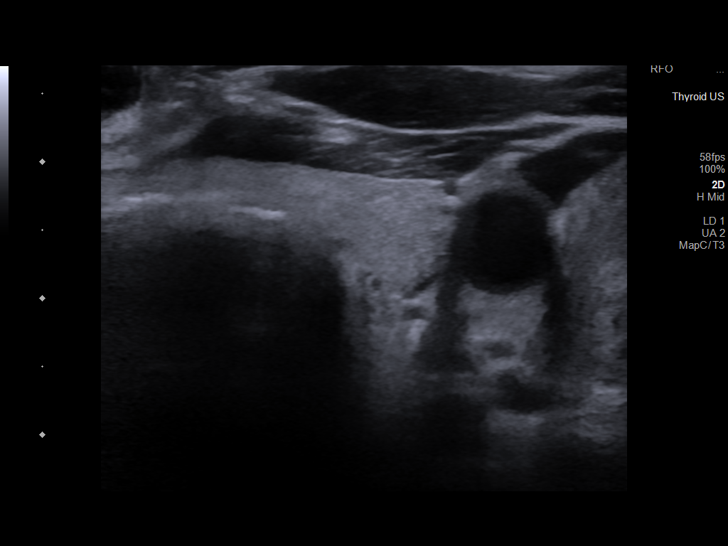
[im 39/47]
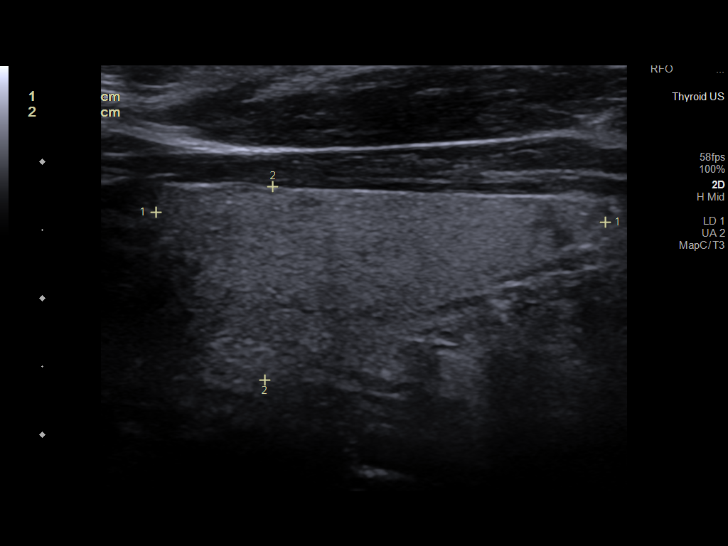
[im 43/47]
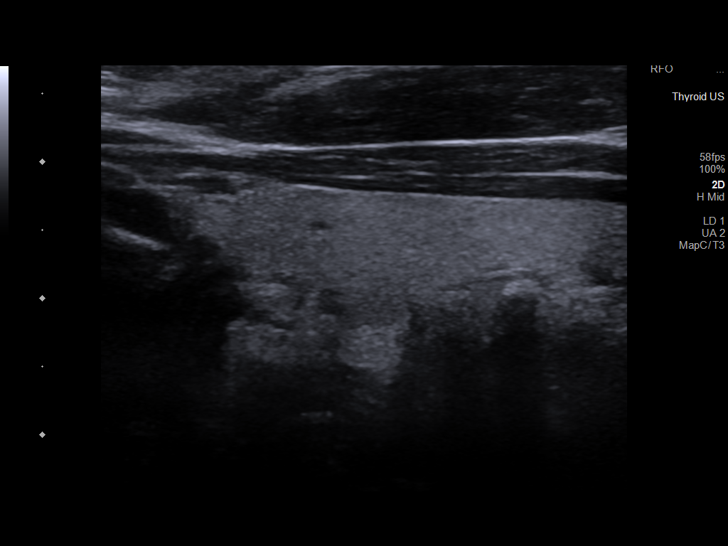
[im 47/47]
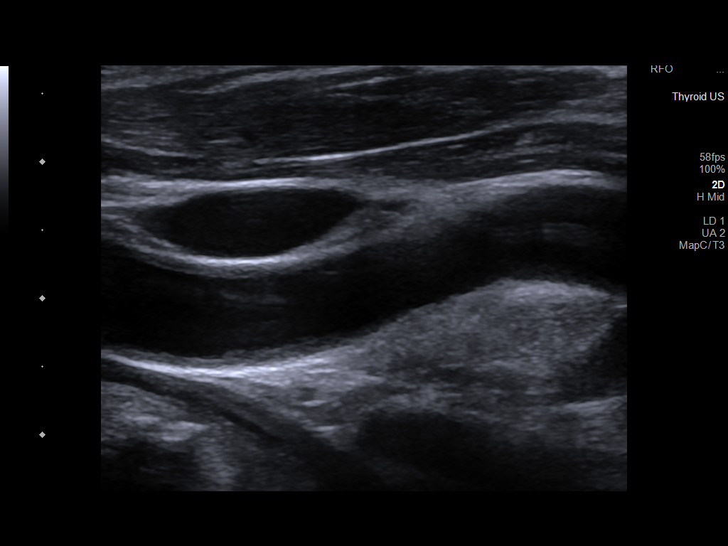

[13 of 25 positions shown; findings below may reference images not displayed]

FINDINGS: Parenchymal Echotexture: Normal

Isthmus: Atrophic measuring 0.1 cm in diameter

Right lobe: Atrophic measuring 3.9 x 1.8 x 1.2 cm

Left lobe: Atrophic measuring 3.3 x 1.4 x 1.0 cm

_________________________________________________________

Estimated total number of nodules >/= 1 cm: 1

Number of spongiform nodules >/=  2 cm not described below (TR1): 0

Number of mixed cystic and solid nodules >/= 1.5 cm not described
below (TR2): 0

_________________________________________________________

Nodule # 1:

Location: Right; Mid - correlates with the nodule seen on preceding
cervical spine CT

Maximum size: 1.7 cm; Other 2 dimensions: 1.6 x 0.9 cm

Composition: solid/almost completely solid (2)

Echogenicity: hypoechoic (2)

Shape: not taller-than-wide (0)

Margins: smooth (0)

Echogenic foci: none (0)

ACR TI-RADS total points: 4.

ACR TI-RADS risk category: TR4 (4-6 points).

ACR TI-RADS recommendations:

**Given size (>/= 1.5 cm) and appearance, fine needle aspiration of
this moderately suspicious nodule should be considered based on
TI-RADS criteria.

_________________________________________________________
IMPRESSION: Solitary right-sided thyroid nodule, correlating with the nodule
seen on preceding cervical spine CT, meet imaging criteria to
recommend percutaneous sampling as indicated.

The above is in keeping with the ACR TI-RADS recommendations - [HOSPITAL] 6551;[DATE].

## 2023-05-29 DIAGNOSIS — L299 Pruritus, unspecified: Secondary | ICD-10-CM | POA: Diagnosis not present

## 2023-05-29 DIAGNOSIS — K219 Gastro-esophageal reflux disease without esophagitis: Secondary | ICD-10-CM | POA: Diagnosis not present

## 2023-05-29 DIAGNOSIS — J309 Allergic rhinitis, unspecified: Secondary | ICD-10-CM | POA: Diagnosis not present

## 2023-05-29 DIAGNOSIS — R21 Rash and other nonspecific skin eruption: Secondary | ICD-10-CM | POA: Diagnosis not present

## 2023-05-31 ENCOUNTER — Encounter: Payer: Self-pay | Admitting: Physical Medicine & Rehabilitation

## 2023-05-31 ENCOUNTER — Encounter: Payer: Medicare Other | Attending: Physical Medicine & Rehabilitation | Admitting: Physical Medicine & Rehabilitation

## 2023-05-31 VITALS — BP 165/80 | HR 87 | Ht 60.0 in

## 2023-05-31 DIAGNOSIS — G8111 Spastic hemiplegia affecting right dominant side: Secondary | ICD-10-CM | POA: Insufficient documentation

## 2023-05-31 MED ORDER — ABOBOTULINUMTOXINA 300 UNITS IM SOLR
600.0000 [IU] | Freq: Once | INTRAMUSCULAR | Status: AC
Start: 2023-05-31 — End: 2023-05-31
  Administered 2023-05-31: 600 [IU] via INTRAMUSCULAR

## 2023-05-31 NOTE — Patient Instructions (Addendum)
ALWAYS FEEL FREE TO CALL OUR OFFICE WITH ANY PROBLEMS OR QUESTIONS [phone])  **PLEASE NOTE** ALL MEDICATION REFILL REQUESTS (INCLUDING CONTROLLED SUBSTANCES) NEED TO BE MADE AT LEAST 7 DAYS PRIOR TO REFILL BEING DUE. ANY REFILL REQUESTS INSIDE THAT TIME FRAME MAY RESULT IN DELAYS IN RECEIVING YOUR PRESCRIPTION.    PLEASE CHECK BLOOD PRESSURE DAILY.  IT WAS ELEVATED HERE IN MY OFFICE.

## 2023-05-31 NOTE — Progress Notes (Signed)
Dysport Injection for spasticity using needle EMG guidance Indication: Right spastic hemiparesis (HCC) - Plan: abobotulinumtoxinA (DYSPORT) 300 units injection 600 Units   Dilution: 500 Units/29ml        Total Units Injected: 600 Indication: Severe spasticity which interferes with ADL,mobility and/or  hygiene and is unresponsive to medication management and other conservative care Informed consent was obtained after describing risks and benefits of the procedure with the patient. This includes bleeding, bruising, infection, excessive weakness, or medication side effects. A REMS form is on file and signed.  Needle: 50mm injectable monopolar needle electrode  right Number of units per muscle  Quadriceps 0 units Gastroc/soleus 300 units Hamstrings 0 units Tibialis Posterior 300 units Tibialis Anterior 0 units EHL 0 units All injections were done after obtaining appropriate EMG activity and after negative drawback for blood. The patient tolerated the procedure well. Post procedure instructions were given. Return in about 3 months (around 08/29/2023).

## 2023-06-01 DIAGNOSIS — I10 Essential (primary) hypertension: Secondary | ICD-10-CM | POA: Diagnosis not present

## 2023-06-01 DIAGNOSIS — E559 Vitamin D deficiency, unspecified: Secondary | ICD-10-CM | POA: Diagnosis not present

## 2023-06-07 DIAGNOSIS — R799 Abnormal finding of blood chemistry, unspecified: Secondary | ICD-10-CM | POA: Diagnosis not present

## 2023-06-07 DIAGNOSIS — E876 Hypokalemia: Secondary | ICD-10-CM | POA: Diagnosis not present

## 2023-06-07 DIAGNOSIS — R54 Age-related physical debility: Secondary | ICD-10-CM | POA: Diagnosis not present

## 2023-06-12 ENCOUNTER — Inpatient Hospital Stay (HOSPITAL_COMMUNITY)
Admission: EM | Admit: 2023-06-12 | Discharge: 2023-06-28 | DRG: 064 | Disposition: E | Payer: Medicare Other | Source: Skilled Nursing Facility | Attending: Neurology | Admitting: Neurology

## 2023-06-12 ENCOUNTER — Encounter (HOSPITAL_COMMUNITY): Payer: Self-pay | Admitting: Neurology

## 2023-06-12 ENCOUNTER — Emergency Department (HOSPITAL_COMMUNITY): Payer: Medicare Other

## 2023-06-12 DIAGNOSIS — I6782 Cerebral ischemia: Secondary | ICD-10-CM | POA: Diagnosis not present

## 2023-06-12 DIAGNOSIS — I10 Essential (primary) hypertension: Secondary | ICD-10-CM | POA: Diagnosis not present

## 2023-06-12 DIAGNOSIS — I609 Nontraumatic subarachnoid hemorrhage, unspecified: Secondary | ICD-10-CM | POA: Diagnosis not present

## 2023-06-12 DIAGNOSIS — G936 Cerebral edema: Secondary | ICD-10-CM | POA: Diagnosis present

## 2023-06-12 DIAGNOSIS — R531 Weakness: Secondary | ICD-10-CM | POA: Diagnosis not present

## 2023-06-12 DIAGNOSIS — R000 Tachycardia, unspecified: Secondary | ICD-10-CM | POA: Diagnosis not present

## 2023-06-12 DIAGNOSIS — J9601 Acute respiratory failure with hypoxia: Secondary | ICD-10-CM | POA: Diagnosis present

## 2023-06-12 DIAGNOSIS — Z66 Do not resuscitate: Secondary | ICD-10-CM | POA: Diagnosis present

## 2023-06-12 DIAGNOSIS — E7800 Pure hypercholesterolemia, unspecified: Secondary | ICD-10-CM | POA: Diagnosis present

## 2023-06-12 DIAGNOSIS — K219 Gastro-esophageal reflux disease without esophagitis: Secondary | ICD-10-CM | POA: Diagnosis present

## 2023-06-12 DIAGNOSIS — E871 Hypo-osmolality and hyponatremia: Secondary | ICD-10-CM | POA: Diagnosis not present

## 2023-06-12 DIAGNOSIS — Z8269 Family history of other diseases of the musculoskeletal system and connective tissue: Secondary | ICD-10-CM

## 2023-06-12 DIAGNOSIS — Z7982 Long term (current) use of aspirin: Secondary | ICD-10-CM

## 2023-06-12 DIAGNOSIS — Z9079 Acquired absence of other genital organ(s): Secondary | ICD-10-CM

## 2023-06-12 DIAGNOSIS — Z555 Less than a high school diploma: Secondary | ICD-10-CM

## 2023-06-12 DIAGNOSIS — M5030 Other cervical disc degeneration, unspecified cervical region: Secondary | ICD-10-CM | POA: Diagnosis present

## 2023-06-12 DIAGNOSIS — Z981 Arthrodesis status: Secondary | ICD-10-CM | POA: Diagnosis not present

## 2023-06-12 DIAGNOSIS — Z8349 Family history of other endocrine, nutritional and metabolic diseases: Secondary | ICD-10-CM

## 2023-06-12 DIAGNOSIS — R29727 NIHSS score 27: Secondary | ICD-10-CM | POA: Diagnosis not present

## 2023-06-12 DIAGNOSIS — Z681 Body mass index (BMI) 19 or less, adult: Secondary | ICD-10-CM | POA: Diagnosis not present

## 2023-06-12 DIAGNOSIS — J9811 Atelectasis: Secondary | ICD-10-CM | POA: Diagnosis not present

## 2023-06-12 DIAGNOSIS — R64 Cachexia: Secondary | ICD-10-CM | POA: Diagnosis not present

## 2023-06-12 DIAGNOSIS — G894 Chronic pain syndrome: Secondary | ICD-10-CM | POA: Diagnosis not present

## 2023-06-12 DIAGNOSIS — Z800 Family history of malignant neoplasm of digestive organs: Secondary | ICD-10-CM

## 2023-06-12 DIAGNOSIS — Z9049 Acquired absence of other specified parts of digestive tract: Secondary | ICD-10-CM | POA: Diagnosis not present

## 2023-06-12 DIAGNOSIS — R4701 Aphasia: Secondary | ICD-10-CM | POA: Diagnosis not present

## 2023-06-12 DIAGNOSIS — I615 Nontraumatic intracerebral hemorrhage, intraventricular: Secondary | ICD-10-CM | POA: Diagnosis not present

## 2023-06-12 DIAGNOSIS — R918 Other nonspecific abnormal finding of lung field: Secondary | ICD-10-CM | POA: Diagnosis not present

## 2023-06-12 DIAGNOSIS — J690 Pneumonitis due to inhalation of food and vomit: Secondary | ICD-10-CM

## 2023-06-12 DIAGNOSIS — R29818 Other symptoms and signs involving the nervous system: Secondary | ICD-10-CM | POA: Diagnosis not present

## 2023-06-12 DIAGNOSIS — Z803 Family history of malignant neoplasm of breast: Secondary | ICD-10-CM

## 2023-06-12 DIAGNOSIS — Z806 Family history of leukemia: Secondary | ICD-10-CM

## 2023-06-12 DIAGNOSIS — Z7189 Other specified counseling: Secondary | ICD-10-CM | POA: Diagnosis not present

## 2023-06-12 DIAGNOSIS — M8580 Other specified disorders of bone density and structure, unspecified site: Secondary | ICD-10-CM | POA: Diagnosis not present

## 2023-06-12 DIAGNOSIS — I610 Nontraumatic intracerebral hemorrhage in hemisphere, subcortical: Principal | ICD-10-CM | POA: Diagnosis present

## 2023-06-12 DIAGNOSIS — Z789 Other specified health status: Secondary | ICD-10-CM | POA: Diagnosis not present

## 2023-06-12 DIAGNOSIS — G9340 Encephalopathy, unspecified: Secondary | ICD-10-CM | POA: Diagnosis present

## 2023-06-12 DIAGNOSIS — I69351 Hemiplegia and hemiparesis following cerebral infarction affecting right dominant side: Secondary | ICD-10-CM | POA: Diagnosis not present

## 2023-06-12 DIAGNOSIS — Z79899 Other long term (current) drug therapy: Secondary | ICD-10-CM

## 2023-06-12 DIAGNOSIS — G935 Compression of brain: Secondary | ICD-10-CM | POA: Diagnosis present

## 2023-06-12 DIAGNOSIS — Z515 Encounter for palliative care: Secondary | ICD-10-CM | POA: Diagnosis not present

## 2023-06-12 DIAGNOSIS — R29810 Facial weakness: Secondary | ICD-10-CM | POA: Diagnosis present

## 2023-06-12 DIAGNOSIS — Z90722 Acquired absence of ovaries, bilateral: Secondary | ICD-10-CM

## 2023-06-12 DIAGNOSIS — Z888 Allergy status to other drugs, medicaments and biological substances status: Secondary | ICD-10-CM

## 2023-06-12 DIAGNOSIS — I619 Nontraumatic intracerebral hemorrhage, unspecified: Principal | ICD-10-CM | POA: Diagnosis present

## 2023-06-12 DIAGNOSIS — N182 Chronic kidney disease, stage 2 (mild): Secondary | ICD-10-CM | POA: Diagnosis present

## 2023-06-12 DIAGNOSIS — E854 Organ-limited amyloidosis: Secondary | ICD-10-CM | POA: Diagnosis present

## 2023-06-12 DIAGNOSIS — I129 Hypertensive chronic kidney disease with stage 1 through stage 4 chronic kidney disease, or unspecified chronic kidney disease: Secondary | ICD-10-CM | POA: Diagnosis present

## 2023-06-12 DIAGNOSIS — Z8042 Family history of malignant neoplasm of prostate: Secondary | ICD-10-CM

## 2023-06-12 DIAGNOSIS — Z8249 Family history of ischemic heart disease and other diseases of the circulatory system: Secondary | ICD-10-CM

## 2023-06-12 DIAGNOSIS — Z90710 Acquired absence of both cervix and uterus: Secondary | ICD-10-CM | POA: Diagnosis not present

## 2023-06-12 DIAGNOSIS — R1310 Dysphagia, unspecified: Secondary | ICD-10-CM | POA: Diagnosis present

## 2023-06-12 DIAGNOSIS — Z808 Family history of malignant neoplasm of other organs or systems: Secondary | ICD-10-CM

## 2023-06-12 DIAGNOSIS — I680 Cerebral amyloid angiopathy: Secondary | ICD-10-CM | POA: Diagnosis present

## 2023-06-12 DIAGNOSIS — J9600 Acute respiratory failure, unspecified whether with hypoxia or hypercapnia: Secondary | ICD-10-CM | POA: Diagnosis not present

## 2023-06-12 DIAGNOSIS — Z8672 Personal history of thrombophlebitis: Secondary | ICD-10-CM

## 2023-06-12 DIAGNOSIS — I6389 Other cerebral infarction: Secondary | ICD-10-CM | POA: Diagnosis not present

## 2023-06-12 DIAGNOSIS — R54 Age-related physical debility: Secondary | ICD-10-CM | POA: Diagnosis present

## 2023-06-12 DIAGNOSIS — I611 Nontraumatic intracerebral hemorrhage in hemisphere, cortical: Secondary | ICD-10-CM | POA: Diagnosis not present

## 2023-06-12 LAB — DIFFERENTIAL
Abs Immature Granulocytes: 0.01 10*3/uL (ref 0.00–0.07)
Basophils Absolute: 0 10*3/uL (ref 0.0–0.1)
Basophils Relative: 1 %
Eosinophils Absolute: 0.1 10*3/uL (ref 0.0–0.5)
Eosinophils Relative: 2 %
Immature Granulocytes: 0 %
Lymphocytes Relative: 51 %
Lymphs Abs: 3 10*3/uL (ref 0.7–4.0)
Monocytes Absolute: 0.5 10*3/uL (ref 0.1–1.0)
Monocytes Relative: 8 %
Neutro Abs: 2.2 10*3/uL (ref 1.7–7.7)
Neutrophils Relative %: 38 %

## 2023-06-12 LAB — COMPREHENSIVE METABOLIC PANEL
ALT: 21 U/L (ref 0–44)
AST: 27 U/L (ref 15–41)
Albumin: 3.7 g/dL (ref 3.5–5.0)
Alkaline Phosphatase: 66 U/L (ref 38–126)
Anion gap: 12 (ref 5–15)
BUN: 13 mg/dL (ref 8–23)
CO2: 22 mmol/L (ref 22–32)
Calcium: 9.2 mg/dL (ref 8.9–10.3)
Chloride: 96 mmol/L — ABNORMAL LOW (ref 98–111)
Creatinine, Ser: 0.67 mg/dL (ref 0.44–1.00)
GFR, Estimated: >60 mL/min (ref >60)
Glucose, Bld: 109 mg/dL — ABNORMAL HIGH (ref 70–99)
Potassium: 4 mmol/L (ref 3.5–5.1)
Sodium: 130 mmol/L — ABNORMAL LOW (ref 135–145)
Total Bilirubin: 0.6 mg/dL (ref <1.2)
Total Protein: 6.2 g/dL — ABNORMAL LOW (ref 6.5–8.1)

## 2023-06-12 LAB — I-STAT CHEM 8, ED
BUN: 15 mg/dL (ref 8–23)
Calcium, Ion: 1.05 mmol/L — ABNORMAL LOW (ref 1.15–1.40)
Chloride: 95 mmol/L — ABNORMAL LOW (ref 98–111)
Creatinine, Ser: 0.6 mg/dL (ref 0.44–1.00)
Glucose, Bld: 107 mg/dL — ABNORMAL HIGH (ref 70–99)
HCT: 37 % (ref 36.0–46.0)
Hemoglobin: 12.6 g/dL (ref 12.0–15.0)
Potassium: 4.1 mmol/L (ref 3.5–5.1)
Sodium: 130 mmol/L — ABNORMAL LOW (ref 135–145)
TCO2: 26 mmol/L (ref 22–32)

## 2023-06-12 LAB — CBC
HCT: 36.8 % (ref 36.0–46.0)
Hemoglobin: 12.8 g/dL (ref 12.0–15.0)
MCH: 31.1 pg (ref 26.0–34.0)
MCHC: 34.8 g/dL (ref 30.0–36.0)
MCV: 89.5 fL (ref 80.0–100.0)
Platelets: 212 10*3/uL (ref 150–400)
RBC: 4.11 MIL/uL (ref 3.87–5.11)
RDW: 12.3 % (ref 11.5–15.5)
WBC: 5.8 10*3/uL (ref 4.0–10.5)
nRBC: 0 % (ref 0.0–0.2)

## 2023-06-12 LAB — PROTIME-INR
INR: 1 (ref 0.8–1.2)
Prothrombin Time: 13.4 s (ref 11.4–15.2)

## 2023-06-12 LAB — ETHANOL: Alcohol, Ethyl (B): <10 mg/dL (ref <10)

## 2023-06-12 LAB — CBG MONITORING, ED: Glucose-Capillary: 110 mg/dL — ABNORMAL HIGH (ref 70–99)

## 2023-06-12 LAB — APTT: aPTT: 29 s (ref 24–36)

## 2023-06-12 MED ORDER — ACETAMINOPHEN 650 MG RE SUPP
650.0000 mg | RECTAL | Status: DC | PRN
Start: 2023-06-12 — End: 2023-06-14
  Administered 2023-06-14: 650 mg via RECTAL

## 2023-06-12 MED ORDER — FLUTICASONE PROPIONATE 50 MCG/ACT NA SUSP
1.0000 | Freq: Every day | NASAL | Status: DC
  Administered 2023-06-13: 1 via NASAL
  Filled 2023-06-12: qty 16

## 2023-06-12 MED ORDER — LEVETIRACETAM IN NACL 1500 MG/100ML IV SOLN
1500.0000 mg | Freq: Once | INTRAVENOUS | Status: AC
  Administered 2023-06-12: 1500 mg via INTRAVENOUS
  Filled 2023-06-12: qty 100

## 2023-06-12 MED ORDER — SENNOSIDES-DOCUSATE SODIUM 8.6-50 MG PO TABS: 1.0000 | ORAL_TABLET | Freq: Two times a day (BID) | ORAL | Status: DC

## 2023-06-12 MED ORDER — ONDANSETRON HCL 4 MG/2ML IJ SOLN
INTRAMUSCULAR | Status: AC
  Filled 2023-06-12: qty 2

## 2023-06-12 MED ORDER — LABETALOL HCL 5 MG/ML IV SOLN
10.0000 mg | Freq: Once | INTRAVENOUS | Status: AC
  Administered 2023-06-12: 10 mg via INTRAVENOUS

## 2023-06-12 MED ORDER — ONDANSETRON HCL 4 MG/2ML IJ SOLN
4.0000 mg | Freq: Four times a day (QID) | INTRAMUSCULAR | Status: DC | PRN
  Administered 2023-06-14: 4 mg via INTRAVENOUS
  Filled 2023-06-12: qty 2

## 2023-06-12 MED ORDER — ONDANSETRON HCL 4 MG/2ML IJ SOLN
4.0000 mg | Freq: Once | INTRAMUSCULAR | Status: AC
  Administered 2023-06-12: 4 mg via INTRAVENOUS

## 2023-06-12 MED ORDER — IOHEXOL 350 MG/ML SOLN
75.0000 mL | Freq: Once | INTRAVENOUS | Status: AC | PRN
  Administered 2023-06-12: 75 mL via INTRAVENOUS

## 2023-06-12 MED ORDER — PANTOPRAZOLE SODIUM 40 MG IV SOLR
40.0000 mg | Freq: Every day | INTRAVENOUS | Status: DC
  Administered 2023-06-12 – 2023-06-13 (×2): 40 mg via INTRAVENOUS
  Filled 2023-06-12 (×2): qty 10

## 2023-06-12 MED ORDER — STROKE: EARLY STAGES OF RECOVERY BOOK: Freq: Once | Status: AC

## 2023-06-12 MED ORDER — ACETAMINOPHEN 160 MG/5ML PO SOLN: 650.0000 mg | ORAL | Status: DC | PRN

## 2023-06-12 MED ORDER — ACETAMINOPHEN 325 MG PO TABS
650.0000 mg | ORAL_TABLET | ORAL | Status: DC | PRN
Start: 2023-06-12 — End: 2023-06-14

## 2023-06-12 MED ORDER — CHLORHEXIDINE GLUCONATE CLOTH 2 % EX PADS
6.0000 | MEDICATED_PAD | Freq: Every day | CUTANEOUS | Status: DC
  Administered 2023-06-13: 6 via TOPICAL

## 2023-06-12 MED ORDER — CLEVIDIPINE BUTYRATE 0.5 MG/ML IV EMUL
0.0000 mg/h | INTRAVENOUS | Status: DC
  Administered 2023-06-12: 2 mg/h via INTRAVENOUS
  Administered 2023-06-13: 5 mg/h via INTRAVENOUS
  Administered 2023-06-13: 3 mg/h via INTRAVENOUS
  Administered 2023-06-14: 8 mg/h via INTRAVENOUS
  Filled 2023-06-12: qty 50
  Filled 2023-06-12: qty 100
  Filled 2023-06-12 (×2): qty 50

## 2023-06-12 NOTE — H&P (Signed)
NEUROLOGY H&P NOTE   Date of service: June 12, 2023 Patient Name: Alison Cuevas MRN:  [id number] DOB:  1945/11/22 Chief Complaint: "stroke code, R sided weakness, aphasia"  History of Present Illness  Alison Cuevas is a 77 y.o. female with hx of GERD, HLD, HTN, DDD, prior L parietal ICH who presents with R sided weakness, L gaze, aphasia.  She lives in a assisted living facility and her care taker was helping her with shower. She was at her baseline prior to shower at [facility] and then noted to have R hemiplegia, L gaze and aphasia around 1920. Care taker called EMS and family and she was brought in as a code stroke.  CT Head demonstrated a large L posterior frontal lobe ICH with 3mm MLS.  Last known well: 1920 Modified rankin score: 3-Moderate disability-requires help but walks WITHOUT assistance ICH Score: 2 (>30cc, intraventricular extension) tNKASE: Not offered due to ICH Thrombectomy: not offered due to ICH NIHSS components Score: Comment  1a Level of Conscious 0[x]  1[]  2[]  3[]      1b LOC Questions 0[]  1[]  2[x]       1c LOC Commands 0[]  1[]  2[x]       2 Best Gaze 0[]  1[]  2[x]       3 Visual 0[]  1[]  2[x]  3[]      4 Facial Palsy 0[]  1[]  2[x]  3[]      5a Motor Arm - left 0[]  1[x]  2[]  3[]  4[]  UN[]    5b Motor Arm - Right 0[]  1[]  2[]  3[]  4[x]  UN[]    6a Motor Leg - Left 0[]  1[x]  2[]  3[]  4[]  UN[]    6b Motor Leg - Right 0[]  1[]  2[]  3[]  4[x]  UN[]    7 Limb Ataxia 0[x]  1[]  2[]  3[]  UN[]     8 Sensory 0[]  1[]  2[x]  UN[]      9 Best Language 0[]  1[]  2[]  3[x]      10 Dysarthria 0[]  1[]  2[x]  UN[]      11 Extinct. and Inattention 0[x]  1[]  2[]       TOTAL:       ROS  Unable to perform due to aphasia,  Past History   Past Medical History:  Diagnosis Date   Allergic rhinitis    maple pollen   Anxiety    Arthritis    Bowel obstruction (HCC)    Chronic low back pain    Dr. Dutch Quint.  Has had back injection--bp elevated after.   Chronic pain syndrome    Chronic renal insufficiency, stage 2 (mild)     GFR 60-70   DDD (degenerative disc disease), cervical    Hx of ACDF (Dr. Jordan Likes).  Followed by Dr. Charlett Blake.  Also, Dr. Vickki Hearing do left C7-T1 intralaminal epidural injection.   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  Resid R arm and leg weakness + flexion contractures.   Hypercholesteremia    mild; pt declined statin trial 05/2014--needs recheck lipid panel at first f/u visit in 2017   Hypertension    LBBB (left bundle branch block)    Osteopenia 06/2017   T score -1.4 FRAX 15% / 2%   Ovarian cyst 2017   82019-robotic assisted bilatel SPO: all path benign.   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 Charlett Blake.   Solitary thyroid nodule 05/2021   needs bx->plan endo ref   Stroke [facility])    Syncope 10/2021   cardiac w/u as per Dr. Mayford Knife   Syncope    2023 (vasovagal suspected)   Trochanteric bursitis of right hip    Recurrent (injection by Dr. Charlett Blake 05/26/15)   Tympanic membrane rupture, right 12/01/2016   As of 08/2018 pt set for tympanomastoidectomy and STSG (Dr. Ignacia Felling). Recurrent fungal/bact OE.   Varicose vein    left leg    Past Surgical History:  Procedure Laterality Date   ABDOMINAL HYSTERECTOMY  03/2015   TAH.  Last pap 2016.  No hx of abnl paps.  Per GYN, no further pap smears indicated.   ANTERIOR AND POSTERIOR REPAIR N/A 03/18/2014   Procedure: Cystocele repair with graft, Vault suspension, Rectocele repair;  Surgeon: Martina Sinner, MD;  Location: [facility];  Service: Urology;  Laterality: N/A;   BIOPSY THYROID  06/2021   benign   CHOLECYSTECTOMY     CHOLECYSTECTOMY,  LAPAROSCOPIC     COLON SURGERY     COLONOSCOPY  04/2006; 05/26/16   2007 (Dr. Jarold Motto): Normal.  04/2016 (Dr. Leone Payor) normal except diverticulosis and decreased anal sphincter tone.  No repeat colonoscopy is recommended due to age.   cspine surgery     Dr. Estelle Grumbles level ant cerv discectomy Lavenia Atlas w/plating   CYSTO N/A 03/18/2014   Procedure: CYSTO;  Surgeon: Martina Sinner, MD;  Location: [facility];  Service: Urology;  Laterality: N/A;   DEXA  07/30/2015; 06/2018   2017 and 2020 -->Osteopenia--repeat 2 yrs. T score -2.29 Mar 2021. Rpt 2 yrs.   LYSIS OF ADHESION N/A 01/31/2018   Procedure: POSSIBLE LYSIS OF ADHESION;  Surgeon: Adolphus Birchwood, MD;  Location: [facility];  Service: Gynecology;  Laterality: N/A;   RECTOCELE REPAIR     with prolasped bledder repair   RESECTION OF COLON     BENIGN TUMOR   RIGHT EAR SURGERY  08/23/2017   Dr. 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  Procedure: XI ROBOTIC ASSISTED BILATERAL SALPINGO OOPHORECTOMY;  Surgeon: Adolphus Birchwood, MD;  Location: [facility];  Service: Gynecology;  Laterality: Bilateral;   TRANSTHORACIC ECHOCARDIOGRAM  01/27/2020   2021 EF 65-70%, NORMAL. 12/01/21 grd I DD, o/w normal.   TUBAL LIGATION     Family History  Problem Relation Age of Onset   Thyroid disease Mother    Hypertension Mother    Diverticulitis Mother    Stomach cancer Father    Heart attack Father    Heart failure Father    Breast cancer Sister 62   Lupus Sister    Melanoma Brother     Prostate cancer Brother    Leukemia Brother    Lung disease Brother    Other Other        Family member with MGUS   Social History   Socioeconomic History   Marital status: Widowed  Spouse name: Iona Beard   Number of children: 3   Years of education: Not on file   Highest education level: 8th grade  Occupational History   Occupation: retired  Tobacco Use   Smoking status: Never   Smokeless tobacco: Never  Vaping Use   Vaping status: Never Used  Substance and Sexual Activity   Alcohol use: No    Alcohol/week: 0.0 standard drinks of alcohol   Drug use: No   Sexual activity: Not Currently    Partners: Male    Birth control/protection: Surgical    Comment: hysterectomy  Other Topics Concern   Not on file  Social History Narrative   Widow (2022), has 3 children (one lives near her).  Has 8 grandchildren, 2 greatgrandchildren.  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  No alcohol.  No drugs.Exercise: clean, work in yard.  10 siblings in all--5 have passed away--1 child age 66 with whooping cough , 1 lupus, 1 melanoma,1 lung cancer, 1 pulm fibrosis   Social Drivers of Corporate investment banker Strain: Low Risk  (07/20/2022)   Overall Financial Resource Strain (CARDIA)    Difficulty of Paying Living Expenses: Not hard at all  Food Insecurity: No Food Insecurity (11/22/2022)   Hunger Vital Sign    Worried About Running Out of Food in the Last Year: Never true    Ran Out of Food in the Last Year: Never true  Transportation Needs: No Transportation Needs (11/22/2022)   PRAPARE - Administrator, Civil Service (Medical): No    Lack of Transportation (Non-Medical): No  Physical Activity: Sufficiently Active (07/20/2022)   Exercise Vital Sign    Days of Exercise per Week: 7 days    Minutes of Exercise per Session: 60 min  Stress: No Stress Concern Present (07/20/2022)   [employer] of  Occupational Health - Occupational Stress Questionnaire    Feeling of Stress : Only a little  Social Connections: Moderately Isolated (07/20/2022)   Social Connection and Isolation Panel [NHANES]    Frequency of Communication with Friends and Family: More than three times a week    Frequency of Social Gatherings with Friends and Family: More than three times a week    Attends Religious Services: 1 to 4 times per year    Active Member of [employer] or Organizations: No    Attends Banker Meetings: Never    Marital Status: Widowed   Allergies  Allergen Reactions   Gabapentin Anxiety    Elevated heart rate and crazy dreams   Prednisone Anxiety and Other (See Comments)    REACTION: funny feeling    Medications   Medications Prior to Admission  Medication Sig Dispense Refill Last Dose/Taking   acetaminophen (TYLENOL) 500 MG tablet Take 1 tablet (500 mg total) by mouth 3 (three) times daily. 30 tablet 0    ALPRAZolam (XANAX) 0.5 MG tablet Take 1 tablet (0.5 mg total) by mouth at bedtime as needed. 30 tablet 0    aspirin EC 81 MG tablet Take 1 tablet (81 mg total) by mouth daily. Swallow whole. 30 tablet 11    Biotin 5 MG TABS Take 5 mg by mouth daily.      bisacodyl (DULCOLAX) 10 MG suppository Place 10 mg rectally as needed for moderate constipation.      cephALEXin (KEFLEX) 500 MG capsule Take 500 mg by mouth 3 (three) times daily.      ciprofloxacin (  CIPRO) 500 MG tablet Take 500 mg by mouth 2 (two) times daily.      clobetasol (TEMOVATE) 0.05 % external solution Apply topically.      feeding supplement (ENSURE ENLIVE / ENSURE PLUS) LIQD Take 237 mLs by mouth 2 (two) times daily between meals. 237 mL 12    fluconazole (DIFLUCAN) 150 MG tablet Take 150 mg by mouth once.      fluticasone (FLONASE) 50 MCG/ACT nasal spray Place 1 spray into both nostrils daily.      GNP 8 HOUR ARTHRITIS RELIEF 650 MG CR tablet Take 650 mg by mouth 3 (three) times daily.       HYDROcodone-acetaminophen (NORCO/VICODIN) 5-325 MG tablet Take 1 tablet by mouth daily.      hydrocortisone (ANUSOL-HC) 25 MG suppository Place 1 suppository (25 mg total) rectally at bedtime. 5 suppository 0    lisinopril (ZESTRIL) 10 MG tablet Take 10 mg by mouth daily.      loratadine (CLARITIN) 10 MG tablet Take 10 mg by mouth every other day.       meclizine (ANTIVERT) 12.5 MG tablet Take 12.5 mg by mouth 2 (two) times daily as needed for dizziness.      Multiple Minerals-Vitamins (CALCIUM-MAGNESIUM-ZINC-D3 PO) Take 1 tablet by mouth daily.       Multiple Vitamin (MULTIVITAMIN WITH MINERALS) TABS tablet Take 1 tablet by mouth daily.      nystatin cream (MYCOSTATIN) Apply 1 Application topically 2 (two) times daily.      pantoprazole (PROTONIX) 40 MG tablet Take 40 mg by mouth daily.      polyethylene glycol (MIRALAX / GLYCOLAX) 17 g packet Take 17 g by mouth 3 (three) times daily.      Sodium Fluoride (CLINPRO 5000) 1.1 % PSTE Take 1 Application by mouth at bedtime.      tamsulosin (FLOMAX) 0.4 MG CAPS capsule Take 0.4 mg by mouth daily.      tiZANidine (ZANAFLEX) 4 MG tablet Take 4 mg by mouth every 8 (eight) hours as needed for muscle spasms.      verapamil (VERELAN) 180 MG 24 hr capsule Take 180 mg by mouth daily.        Vitals   Vitals:   06/12/23 2155 06/12/23 2200 06/12/23 2205 06/12/23 2210  BP: (!) 140/68 138/82 (!) 141/89 (!) 141/70  Pulse: 87 86 87 86  Resp: 15 15 16 16   Temp:      TempSrc:      SpO2: 96% 95% 95% 96%  Weight:    44.1 kg     Body mass index is 18.99 kg/m.  Physical Exam   General: Laying comfortably in bed; in no acute distress.  HENT: Normal oropharynx and mucosa. Normal external appearance of ears and nose.  Neck: Supple, no pain or tenderness  CV: No JVD. No peripheral edema.  Pulmonary: Symmetric Chest rise. Normal respiratory effort.  Abdomen: Soft to touch, non-tender.  Ext: No cyanosis, edema, or deformity  Skin: No rash. Normal palpation  of skin.   Musculoskeletal: Normal digits and nails by inspection. No clubbing.   Neurologic Examination  Mental status/Cognition: Alert, makes eye contact on left. Does not answer any orientation questions. Speech/language: mute, global aphasia. Doesnot follow commands or repeat. Cranial nerves:   CN II Pupils equal and reactive to light, does not blink to threat on the right.   CN III,IV,VI L gaze deviation, does not cross midline.   CN V Does not regard touch or slight pinch in R  face.   CN VII R facial droop   CN VIII Makes eye contact to speech on the left only.   CN IX & X Awake and protecting her airway   CN XI 5/5 head turn and 5/5 shoulder shrug bilaterally    CN XII midline tongue but does not protrude on command.   Motor:  Muscle bulk: poor, tone flaccid in RUE and RLE Mvmt Root Nerve  Muscle Right Left Comments  SA C5/6 Ax Deltoid     EF C5/6 Mc Biceps 0 4   EE C6/7/8 Rad Triceps 0 4   WF C6/7 Med FCR     WE C7/8 PIN ECU     F Ab C8/T1 U ADM/FDI 0 5   HF L1/2/3 Fem Illopsoas 0 4   KE L2/3/4 Fem Quad     DF L4/5 D Peron Tib Ant     PF S1/2 Tibial Grc/Sol      Sensation:  Light touch Does not    Pin prick    Temperature    Vibration   Proprioception    Coordination/Complex Motor:  - Unable to assess.  Labs   CBC:  Recent Labs  Lab 06/12/23 2049 06/12/23 2050  WBC 5.8  --   NEUTROABS 2.2  --   HGB 12.8 12.6  HCT 36.8 37.0  MCV 89.5  --   PLT 212  --     Basic Metabolic Panel:  Lab Results  Component Value Date   NA 130 (L) 06/12/2023   K 4.1 06/12/2023   CO2 22 06/12/2023   GLUCOSE 107 (H) 06/12/2023   BUN 15 06/12/2023   CREATININE 0.60 06/12/2023   CALCIUM 9.2 06/12/2023   GFRNONAA >60 06/12/2023   GFRAA >60 02/17/2020   Lipid Panel:  Lab Results  Component Value Date   LDLCALC 87 11/11/2021   HgbA1c:  Lab Results  Component Value Date   HGBA1C 5.0 01/28/2020   Urine Drug Screen:     Component Value Date/Time   LABOPIA NONE  DETECTED 01/26/2020 2040   COCAINSCRNUR NONE DETECTED 01/26/2020 2040   LABBENZ POSITIVE (A) 01/26/2020 2040   AMPHETMU NONE DETECTED 01/26/2020 2040   THCU NONE DETECTED 01/26/2020 2040   LABBARB NONE DETECTED 01/26/2020 2040    Alcohol Level     Component Value Date/Time   ETH <10 06/12/2023 2049   INR  Lab Results  Component Value Date   INR 1.0 06/12/2023   APTT  Lab Results  Component Value Date   APTT 29 06/12/2023     CT Head without contrast(Personally reviewed): 4.7 x 4.1 x 4.9 cm left posterior frontal lobe parenchymal hemorrhage with intraventricular extension. 3 mm rightward midline shift.  CT angio Head and Neck with contrast(Personally reviewed): No cavernoma, no aneurysm.  MRI Brain(Personally reviewed): Pending  Impression   Alison Cuevas is a 77 y.o. female with hx of GERD, HLD, HTN, DDD, prior L parietal ICH who presents with R sided weakness, L gaze, aphasia. Found to have L posterior frontal lobe ICH with IVH with an ICH score of 2.  Primary Diagnosis:  Nontraumatic intracerebral hemorrhage in hemisphere, cortical  Secondary Diagnosis: Brain compression, Cerebral edema, and Essential (primary) hypertension  Recommendations  - Admit to ICU - Stability scan in 6 hours or STAT with any neurological decline - Frequent neuro checks; q75min for 1 hour, then q1hour - No antiplatelets or anticoagulants due to ICH - SCD for DVT prophylaxis, pharmacological DVT ppx at 24 hours if ICH  is stable - Blood pressure control with goal systolic 130 - 150, cleverplex and labetalol PRN - Stroke labs, HgbA1c, fasting lipid panel - MRI brain with and without contrast when stabilized to evaluate for underlying mass - MRA without contrast of the brain and Vasc US carotid duplex to evaluate for underlying vascular abnormality. - Risk factor modification - Echocardiogram - PT consult, OT consult, Speech consult. - Stroke team to follow - consider getting  palliative on board in the AM. ______________________________________________________________________  This patient is critically ill and at significant risk of neurological worsening, death and care requires constant monitoring of vital signs, hemodynamics,respiratory and cardiac monitoring, neurological assessment, discussion with family, other specialists and medical decision making of high complexity. I spent 50 minutes of neurocritical care time  in the care of  this patient. This was time spent independent of any time provided by nurse practitioner or PA.  Erick Blinks [facility] 06/12/2023  11:34 PM  Signed,  Erick Blinks, MD [facility]

## 2023-06-12 NOTE — ED Notes (Signed)
Updated [facility] RN on pt. Status and POC.

## 2023-06-12 NOTE — ED Notes (Signed)
Neurologist at bedside to update family on POC

## 2023-06-12 NOTE — ED Triage Notes (Signed)
Pt. Presents to the ED BIBA from [facility] as code stroke. LKW reported at 1920 when aid was giving her a shower and noticed pt. Not responding as usual. EMS reports increased right sided weakness and left sided gaze. Pt. A&Ox4 baseline but nonverbal upon arrival. Right sided contracted. Hx of 2 previous strokes w/ right sided deficits.

## 2023-06-12 NOTE — Progress Notes (Signed)
eLink Physician-Brief Progress Note Patient Name: [name] DOB: Jan 18, 1946 MRN: [id number]   Date of Service  06/12/2023  HPI/Events of Note  77 year old female with a history of essential hypertension, dyslipidemia and prior CVA with residual right-sided weakness initially presented for code stroke found to have large intraparenchymal hemorrhage in the left posterior frontal area.  No large vessel occlusion or vascular abnormalities noted on angiography.  Vital signs are within normal limits.  Saturating 93% on room air.  Loaded with Keppra.  Results consistent with mild hyponatremia.  ECG with bundle branch without acute ischemic changes.  eICU Interventions  Maintain tight blood pressure control, clevidipine as needed  Interval CT head at 6 hours, routine MRI to evaluate for underlying etiology  DVT prophylaxis with SCDs PPI for GI prophylaxis    Intervention Category Evaluation Type: New Patient Evaluation  [name] 06/12/2023, 11:20 PM

## 2023-06-12 NOTE — Code Documentation (Signed)
Stroke Response Nurse Documentation Code Documentation  Alison Cuevas is a 77 y.o. female arriving to [facility]  via [facility] on 12/16 with past medical hx of HTN, HLD, CVA with residual right sided weakness. On aspirin 81 mg daily. Code stroke was activated by EMS.   Patient from SNF where she was unknown LKW and now complaining of right sided weakness and aphasia.    Stroke team at the bedside on patient arrival. Labs drawn and patient cleared for CT by Dr. Karene Fry. Patient to CT with team. NIHSS 23, see documentation for details and code stroke times. Patient with disoriented, not following commands, left gaze preference , right hemianopia, right facial droop, right arm weakness, bilateral leg weakness, Global aphasia , and dysarthria  on exam. The following imaging was completed:  CT Head and CTA. Patient is not a candidate for IV Thrombolytic due to ICH. Patient is not a candidate for IR due to ICH.   Care Plan: SBP 130-177mmHg.   Bedside handoff with ED RN Gardiner Ramus.    Avia Fillers  Rapid Response RN

## 2023-06-12 NOTE — ED Provider Notes (Signed)
Alison Cuevas Provider Note   CSN: [id number] Arrival date & time: 06/12/23  2041  An emergency department physician performed an initial assessment on this suspected stroke patient at 2042.  History  Chief Complaint  Patient presents with   Code Stroke    Alison Cuevas is a 77 y.o. female.  HPI   77 year old female with medical history significant for HTN, HLD, prior CVA with residual right-sided weakness presenting to the emergency department as a code stroke.  The patient presented from her skilled nursing facility with an unknown last known well complaining of right-sided weakness and aphasia.  Patient arrived with left gaze preference, right facial droop, right arm weakness, global aphasia on exam.  CBG 110.  Patient protecting her airway, cleared for CT imaging, found to have a large intraparenchymal hemorrhage.  Home Medications Prior to Admission medications   Medication Sig Start Date End Date Taking? Authorizing Provider  acetaminophen (TYLENOL) 500 MG tablet Take 1 tablet (500 mg total) by mouth 3 (three) times daily. 02/19/20   Love, Evlyn Kanner, PA-C  ALPRAZolam Prudy Feeler) 0.5 MG tablet Take 1 tablet (0.5 mg total) by mouth at bedtime as needed. 12/02/22   Sharee Holster, NP  aspirin EC 81 MG tablet Take 1 tablet (81 mg total) by mouth daily. Swallow whole. 04/20/20   Ihor Austin, NP  Biotin 5 MG TABS Take 5 mg by mouth daily.    [provider]  bisacodyl (DULCOLAX) 10 MG suppository Place 10 mg rectally as needed for moderate constipation.    [provider]  cephALEXin (KEFLEX) 500 MG capsule Take 500 mg by mouth 3 (three) times daily. 12/20/22   [provider]  ciprofloxacin (CIPRO) 500 MG tablet Take 500 mg by mouth 2 (two) times daily. 01/10/23   [provider]  clobetasol (TEMOVATE) 0.05 % external solution Apply topically. 01/13/22   [provider]  feeding supplement (ENSURE  ENLIVE / ENSURE PLUS) LIQD Take 237 mLs by mouth 2 (two) times daily between meals. 11/25/22   Catarina Hartshorn, MD  fluconazole (DIFLUCAN) 150 MG tablet Take 150 mg by mouth once. 01/12/23   [provider]  fluticasone (FLONASE) 50 MCG/ACT nasal spray Place 1 spray into both nostrils daily.    [provider]  GNP 8 HOUR ARTHRITIS RELIEF 650 MG CR tablet Take 650 mg by mouth 3 (three) times daily. 01/20/23   [provider]  HYDROcodone-acetaminophen (NORCO/VICODIN) 5-325 MG tablet Take 1 tablet by mouth daily.    [provider]  hydrocortisone (ANUSOL-HC) 25 MG suppository Place 1 suppository (25 mg total) rectally at bedtime. 12/08/22   Arnaldo Natal, NP  lisinopril (ZESTRIL) 10 MG tablet Take 10 mg by mouth daily.    [provider]  loratadine (CLARITIN) 10 MG tablet Take 10 mg by mouth every other day.     [provider]  meclizine (ANTIVERT) 12.5 MG tablet Take 12.5 mg by mouth 2 (two) times daily as needed for dizziness.    [provider]  Multiple Minerals-Vitamins (CALCIUM-MAGNESIUM-ZINC-D3 PO) Take 1 tablet by mouth daily.     [provider]  Multiple Vitamin (MULTIVITAMIN WITH MINERALS) TABS tablet Take 1 tablet by mouth daily. 11/26/22   Catarina Hartshorn, MD  nystatin cream (MYCOSTATIN) Apply 1 Application topically 2 (two) times daily. 04/03/23   [provider]  pantoprazole (PROTONIX) 40 MG tablet Take 40 mg by mouth daily.    [provider]  polyethylene glycol (MIRALAX / GLYCOLAX) 17 g packet Take 17 g by mouth 3 (three) times daily.    [provider]  Sodium Fluoride (CLINPRO 5000) 1.1 % PSTE Take 1 Application by mouth at bedtime.    [provider]  tamsulosin (FLOMAX) 0.4 MG CAPS capsule Take 0.4 mg by mouth daily.    [provider]  tiZANidine (ZANAFLEX) 4 MG tablet Take 4 mg by mouth every 8 (eight) hours as needed for muscle spasms.    [provider]   verapamil (VERELAN) 180 MG 24 hr capsule Take 180 mg by mouth daily. 12/20/22   [provider]      Allergies    Gabapentin and Prednisone    Review of Systems   Review of Systems  Unable to perform ROS: Acuity of condition    Physical Exam Updated Vital Signs BP (!) 141/70   Pulse 86   Temp 98.2 F (36.8 C) (Axillary)   Resp 16   Wt 44.1 kg   LMP 06/27/1972 Comment: not sexually active  SpO2 96%   BMI 18.99 kg/m  Physical Exam Vitals and nursing note reviewed.  Constitutional:      General: She is not in acute distress. HENT:     Head: Normocephalic and atraumatic.  Eyes:     Conjunctiva/sclera: Conjunctivae normal.  Cardiovascular:     Rate and Rhythm: Normal rate and regular rhythm.  Pulmonary:     Effort: Pulmonary effort is normal. No respiratory distress.  Abdominal:     General: There is no distension.     Tenderness: There is no guarding.  Musculoskeletal:        General: No deformity or signs of injury.     Cervical back: Neck supple.  Skin:    Findings: No lesion or rash.  Neurological:     Mental Status: She is alert.     Comments: Deferred to neuro, currently evaluating the patient bedside     ED Results / Procedures / Treatments   Labs (all labs ordered are listed, but only abnormal results are displayed) Labs Reviewed  COMPREHENSIVE METABOLIC PANEL - Abnormal; Notable for the following components:      Result Value   Sodium 130 (*)    Chloride 96 (*)    Glucose, Bld 109 (*)    Total Protein 6.2 (*)    All other components within normal limits  I-STAT CHEM 8, ED - Abnormal; Notable for the following components:   Sodium 130 (*)    Chloride 95 (*)    Glucose, Bld 107 (*)    Calcium, Ion 1.05 (*)    All other components within normal limits  CBG MONITORING, ED - Abnormal; Notable for the following components:   Glucose-Capillary 110 (*)    All other components within normal limits  MRSA NEXT GEN BY PCR, NASAL  ETHANOL   PROTIME-INR  APTT  CBC  DIFFERENTIAL  RAPID URINE DRUG SCREEN, HOSP PERFORMED  URINALYSIS, ROUTINE W REFLEX MICROSCOPIC    EKG EKG Interpretation Date/Time:  Monday June 12 2023 21:07:48 EST Ventricular Rate:  77 PR Interval:    QRS Duration:  176 QT Interval:  481 QTC Calculation: 545 R Axis:   9  Text Interpretation: Sinus rhythm Left bundle branch block Confirmed by Ernie Avena ([id number]) on 06/12/2023 9:24:18 PM  Radiology CT ANGIO HEAD NECK W WO CM (CODE STROKE) Result Date: 06/12/2023 CLINICAL DATA:  Right-sided weakness EXAM: CT ANGIOGRAPHY HEAD AND NECK WITH AND WITHOUT  CONTRAST TECHNIQUE: Multidetector CT imaging of the head and neck was performed using the standard protocol during bolus administration of intravenous contrast. Multiplanar CT image reconstructions and MIPs were obtained to evaluate the vascular anatomy. Carotid stenosis measurements (when applicable) are obtained utilizing NASCET criteria, using the distal internal carotid diameter as the denominator. RADIATION DOSE REDUCTION: This exam was performed according to the departmental dose-optimization program which includes automated exposure control, adjustment of the mA and/or kV according to patient size and/or use of iterative reconstruction technique. CONTRAST:  75mL OMNIPAQUE IOHEXOL 350 MG/ML SOLN COMPARISON:  Same day CT head FINDINGS: CT HEAD FINDINGS See same day CT head for intracranial findings CTA NECK FINDINGS Aortic arch: Standard branching. Imaged portion shows no evidence of aneurysm or dissection. No significant stenosis of the major arch vessel origins. Right carotid system: No evidence of dissection, stenosis (50% or greater), or occlusion. Left carotid system: No evidence of dissection, stenosis (50% or greater), or occlusion. Vertebral arteries: Left dominant. No evidence of dissection, stenosis (50% or greater), or occlusion. Skeleton: Status post C3-C6 ACDF Other neck: Assessment of the cervical  soft tissues is limited due to the degree of motion. Upper chest: Negative. Review of the MIP images confirms the above findings CTA HEAD FINDINGS Anterior circulation: No significant stenosis, proximal occlusion, aneurysm, or vascular malformation. Posterior circulation: No significant stenosis, proximal occlusion, aneurysm, or vascular malformation. Venous sinuses: As permitted by contrast timing, patent. Anatomic variants: Fetal PCA on the left Review of the MIP images confirms the above findings IMPRESSION: 1. No intracranial large vessel occlusion or significant stenosis. 2. No hemodynamically significant stenosis in the neck. 3. See same day CT head for intracranial findings. Electronically Signed   By: Lorenza Cambridge M.D.   On: 06/12/2023 21:16   CT HEAD CODE STROKE WO CONTRAST Result Date: 06/12/2023 CLINICAL DATA:  Code stroke.  Right-sided weakness EXAM: CT HEAD WITHOUT CONTRAST TECHNIQUE: Contiguous axial images were obtained from the base of the skull through the vertex without intravenous contrast. RADIATION DOSE REDUCTION: This exam was performed according to the departmental dose-optimization program which includes automated exposure control, adjustment of the mA and/or kV according to patient size and/or use of iterative reconstruction technique. COMPARISON:  CT head 11/20/22 FINDINGS: Brain: 4.7 x 4.1 x 4.9 cm left posterior frontal lobe parenchymal hemorrhage with a hematocrit level and intraventricular extension. There are layering blood products in the left occipital horn. There is 3 mm rightward midline shift. There is a background moderate chronic microvascular ischemic change. Vascular: No hyperdense vessel or unexpected calcification. Skull: Normal. Negative for fracture or focal lesion. Sinuses/Orbits: Small right mastoid effusion. Postsurgical changes from a wall up mastoidectomy. No middle ear effusion. Paranasal sinuses are notable for complete opacification of the left sphenoid sinus.  Orbits are unremarkable. Other: None. IMPRESSION: 4.7 x 4.1 x 4.9 cm left posterior frontal lobe parenchymal hemorrhage with intraventricular extension. 3 mm rightward midline shift. Findings were paged to Dr. Derry Lory on 06/12/23 at 8:59 PM. Electronically Signed   By: Lorenza Cambridge M.D.   On: 06/12/2023 21:01    Procedures .Critical Care  Performed by: Ernie Avena, MD Authorized by: Ernie Avena, MD   Critical care provider statement:    Critical care time (minutes):  30   Critical care was necessary to treat or prevent imminent or life-threatening deterioration of the following conditions:  CNS failure or compromise   Critical care was time spent personally by me on the following activities:  Development of treatment plan  with patient or surrogate, discussions with consultants, evaluation of patient's response to treatment, examination of patient, ordering and review of laboratory studies, ordering and review of radiographic studies, ordering and performing treatments and interventions, pulse oximetry, re-evaluation of patient's condition and review of old charts   Care discussed with: admitting provider       Medications Ordered in ED Medications  labetalol (NORMODYNE) injection 10 mg (10 mg Intravenous Given 06/12/23 2055)    And  clevidipine (CLEVIPREX) infusion 0.5 mg/mL (4 mg/hr Intravenous Restarted 06/12/23 2218)   stroke: early stages of recovery book (has no administration in time range)  acetaminophen (TYLENOL) tablet 650 mg (has no administration in time range)    Or  acetaminophen (TYLENOL) 160 MG/5ML solution 650 mg (has no administration in time range)    Or  acetaminophen (TYLENOL) suppository 650 mg (has no administration in time range)  senna-docusate (Senokot-S) tablet 1 tablet (1 tablet Oral Not Given 06/12/23 2125)  pantoprazole (PROTONIX) injection 40 mg (40 mg Intravenous Given 06/12/23 2145)  Chlorhexidine Gluconate Cloth 2 % PADS 6 each (has no  administration in time range)  iohexol (OMNIPAQUE) 350 MG/ML injection 75 mL (75 mLs Intravenous Contrast Given 06/12/23 2101)  levETIRAcetam (KEPPRA) IVPB 1500 mg/ 100 mL premix (0 mg Intravenous Stopped 06/12/23 2210)    ED Course/ Medical Decision Making/ A&P                                 Medical Decision Making Amount and/or Complexity of Data Reviewed Labs: ordered. Radiology: ordered.  Risk Decision regarding hospitalization.    77 year old female with medical history significant for HTN, HLD, prior CVA with residual right-sided weakness presenting to the emergency department as a code stroke.  The patient presented from her skilled nursing facility with an unknown last known well complaining of right-sided weakness and aphasia.  Patient arrived with left gaze preference, right facial droop, right arm weakness, global aphasia on exam.  CBG 110.  Patient protecting her airway, cleared for CT imaging, found to have a large intraparenchymal hemorrhage.  Vitals on arrival, afebrile, not tachycardic or tachypneic, hypertensive BP 163/95, saturating 90% on room air.  Sinus rhythm noted on cardiac telemetry.  CT Code Stroke: IMPRESSION:  4.7 x 4.1 x 4.9 cm left posterior frontal lobe parenchymal  hemorrhage with intraventricular extension. 3 mm rightward midline  shift.   CTA Head and Neck: IMPRESSION:  1. No intracranial large vessel occlusion or significant stenosis.  2. No hemodynamically significant stenosis in the neck.  3. See same day CT head for intracranial findings.    Found to have intraparenchymal hemorrhage on exam, started on a Cleviprex drip and admitted under neurology.  Family updated bedside by neurology.  For now, patient remains a full code until discussions can be had with the patient's healthcare POA.  Currently protecting her airway, further CODE STATUS discussions pending.  Patient subsequently admitted to the ICU in critical condition.  CODE STATUS  subsequently updated to DNR limited.   Final Clinical Impression(s) / ED Diagnoses Final diagnoses:  Left-sided nontraumatic intracerebral hemorrhage, unspecified cerebral location [facility])    Rx / DC Orders ED Discharge Orders     None         Ernie Avena, MD 06/12/23 2315

## 2023-06-13 ENCOUNTER — Inpatient Hospital Stay (HOSPITAL_COMMUNITY): Payer: Medicare Other

## 2023-06-13 ENCOUNTER — Encounter: Payer: Self-pay | Admitting: Physical Medicine & Rehabilitation

## 2023-06-13 DIAGNOSIS — I610 Nontraumatic intracerebral hemorrhage in hemisphere, subcortical: Principal | ICD-10-CM

## 2023-06-13 DIAGNOSIS — J9601 Acute respiratory failure with hypoxia: Secondary | ICD-10-CM | POA: Diagnosis not present

## 2023-06-13 DIAGNOSIS — I619 Nontraumatic intracerebral hemorrhage, unspecified: Secondary | ICD-10-CM | POA: Diagnosis not present

## 2023-06-13 DIAGNOSIS — I6389 Other cerebral infarction: Secondary | ICD-10-CM | POA: Diagnosis not present

## 2023-06-13 LAB — URINALYSIS, ROUTINE W REFLEX MICROSCOPIC
Bilirubin Urine: NEGATIVE
Glucose, UA: 50 mg/dL — AB
Hgb urine dipstick: NEGATIVE
Ketones, ur: NEGATIVE mg/dL
Leukocytes,Ua: NEGATIVE
Nitrite: NEGATIVE
Protein, ur: NEGATIVE mg/dL
Specific Gravity, Urine: 1.018 (ref 1.005–1.030)
pH: 7 (ref 5.0–8.0)

## 2023-06-13 LAB — RAPID URINE DRUG SCREEN, HOSP PERFORMED
Amphetamines: NOT DETECTED
Barbiturates: NOT DETECTED
Benzodiazepines: NOT DETECTED
Cocaine: NOT DETECTED
Opiates: NOT DETECTED
Tetrahydrocannabinol: NOT DETECTED

## 2023-06-13 LAB — ECHOCARDIOGRAM COMPLETE
S' Lateral: 2.7 cm
Weight: 1555.57 [oz_av]

## 2023-06-13 LAB — SODIUM
Sodium: 131 mmol/L — ABNORMAL LOW (ref 135–145)
Sodium: 139 mmol/L (ref 135–145)
Sodium: 143 mmol/L (ref 135–145)

## 2023-06-13 LAB — MRSA NEXT GEN BY PCR, NASAL: MRSA by PCR Next Gen: NOT DETECTED

## 2023-06-13 MED ORDER — SODIUM CHLORIDE 0.9 % IV SOLN
3.0000 g | Freq: Four times a day (QID) | INTRAVENOUS | Status: DC
  Administered 2023-06-13: 3 g via INTRAVENOUS
  Filled 2023-06-13: qty 8

## 2023-06-13 MED ORDER — GADOBUTROL 1 MMOL/ML IV SOLN
5.0000 mL | Freq: Once | INTRAVENOUS | Status: AC | PRN
  Administered 2023-06-13: 5 mL via INTRAVENOUS

## 2023-06-13 MED ORDER — LABETALOL HCL 5 MG/ML IV SOLN
10.0000 mg | INTRAVENOUS | Status: DC | PRN
Start: 2023-06-13 — End: 2023-06-14

## 2023-06-13 MED ORDER — SODIUM CHLORIDE 0.9 % IV SOLN
3.0000 g | Freq: Four times a day (QID) | INTRAVENOUS | Status: DC
  Administered 2023-06-13 – 2023-06-14 (×2): 3 g via INTRAVENOUS
  Filled 2023-06-13 (×2): qty 8

## 2023-06-13 MED ORDER — SODIUM CHLORIDE 3 % IV BOLUS
250.0000 mL | Freq: Once | INTRAVENOUS | Status: AC
  Administered 2023-06-13: 250 mL via INTRAVENOUS
  Filled 2023-06-13: qty 500

## 2023-06-13 MED ORDER — SODIUM CHLORIDE 3 % IV SOLN
INTRAVENOUS | Status: DC
  Filled 2023-06-13 (×3): qty 500

## 2023-06-13 MED ORDER — HYDRALAZINE HCL 20 MG/ML IJ SOLN
10.0000 mg | INTRAMUSCULAR | Status: DC | PRN
Start: 2023-06-13 — End: 2023-06-14

## 2023-06-13 MED ORDER — ORAL CARE MOUTH RINSE: 15.0000 mL | OROMUCOSAL | Status: DC | PRN

## 2023-06-13 MED ORDER — PROCHLORPERAZINE EDISYLATE 10 MG/2ML IJ SOLN
10.0000 mg | Freq: Once | INTRAMUSCULAR | Status: AC
  Administered 2023-06-13: 10 mg via INTRAVENOUS
  Filled 2023-06-13: qty 2

## 2023-06-13 NOTE — Progress Notes (Signed)
STAT EEG complete - results pending. ? ?

## 2023-06-13 NOTE — Progress Notes (Signed)
NAME:  Alison Cuevas, MRN:  [id number], DOB:  1946/04/12, LOS: 1 ADMISSION DATE:  06/12/2023, CONSULTATION DATE:  06/13/2023 REFERRING MD:  Pearlean Brownie - Stroke Neurology, CHIEF COMPLAINT:  aspiration.   History of Present Illness:  77 year old woman who presented with acute right hemiplegia and left gaze deviation from SNF. Was found to have a large posterior frontal ICH with midline shift. ICH score 2.  Patient has been having repeated episodes of aspiration, the latest following an episode of emesis. Patient has been NPO and failed bedside swallowing evaluation.  Has not eaten since yesterday at 5pm.  Son states that the patient has been in declining health since her prior stroke in 2021.   Pertinent  Medical History   Past Medical History:  Diagnosis Date   Allergic rhinitis    maple pollen   Anxiety    Arthritis    Bowel obstruction (HCC)    Chronic low back pain    Dr. Dutch Quint.  Has had back injection--bp elevated after.   Chronic pain syndrome    Chronic renal insufficiency, stage 2 (mild)    GFR 60-70   DDD (degenerative disc disease), cervical    Hx of ACDF (Dr. Jordan Likes).  Followed by Dr. Charlett Blake.  Also, Dr. Vickki Hearing do left C7-T1 intralaminal epidural injection.   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  Resid R arm and leg weakness + flexion contractures.   Hypercholesteremia    mild; pt declined statin trial 05/2014--needs recheck lipid panel at first f/u visit in 2017   Hypertension    LBBB (left bundle branch block)    Osteopenia 06/2017   T score -1.4 FRAX 15% / 2%   Ovarian cyst 2017   82019-robotic assisted bilatel SPO: all path benign.   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 Charlett Blake.   Solitary thyroid nodule 05/2021   needs bx->plan endo ref   Stroke [facility])    Syncope 10/2021   cardiac w/u as per Dr. Mayford Knife   Syncope    2023 (vasovagal suspected)   Trochanteric bursitis of right hip    Recurrent (injection by Dr. Charlett Blake 05/26/15)   Tympanic membrane rupture, right 12/01/2016   As of 08/2018 pt set for tympanomastoidectomy and STSG (Dr. Ignacia Felling). Recurrent fungal/bact OE.   Varicose vein    left leg    Past Surgical History:  Procedure Laterality Date   ABDOMINAL HYSTERECTOMY  03/2015   TAH.  Last pap 2016.  No hx of abnl paps.  Per GYN, no further pap smears indicated.   ANTERIOR AND POSTERIOR REPAIR N/A 03/18/2014   Procedure: Cystocele repair with graft, Vault suspension, Rectocele repair;  Surgeon: Martina Sinner, MD;  Location: [facility];  Service: Urology;  Laterality: N/A;   BIOPSY THYROID  06/2021   benign   CHOLECYSTECTOMY     CHOLECYSTECTOMY, LAPAROSCOPIC     COLON SURGERY     COLONOSCOPY  04/2006; 05/26/16   2007 (Dr. Jarold Motto): Normal.  04/2016 (Dr. Leone Payor) normal except diverticulosis and decreased anal sphincter tone.  No repeat colonoscopy is recommended  due to age.   cspine surgery     Dr. Estelle Grumbles level ant cerv discectomy Lavenia Atlas w/plating   CYSTO N/A 03/18/2014   Procedure: CYSTO;  Surgeon: Martina Sinner, MD;  Location: [facility];  Service: Urology;  Laterality: N/A;   DEXA  07/30/2015; 06/2018   2017 and 2020 -->Osteopenia--repeat 2 yrs. T score -2.29 Mar 2021. Rpt 2 yrs.   LYSIS OF ADHESION N/A 01/31/2018   Procedure: POSSIBLE LYSIS OF ADHESION;  Surgeon: Adolphus Birchwood, MD;  Location: [facility];  Service: Gynecology;  Laterality: N/A;   RECTOCELE REPAIR     with prolasped bledder repair   RESECTION OF COLON     BENIGN TUMOR   RIGHT EAR SURGERY  08/23/2017   Dr. 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  Procedure: XI ROBOTIC ASSISTED BILATERAL SALPINGO OOPHORECTOMY;  Surgeon: Adolphus Birchwood, MD;  Location: [facility];  Service: Gynecology;  Laterality: Bilateral;   TRANSTHORACIC ECHOCARDIOGRAM  01/27/2020   2021 EF 65-70%, NORMAL. 12/01/21 grd I DD, o/w normal.   TUBAL LIGATION     No current facility-administered medications on file prior to encounter.   Current Outpatient Medications on File Prior to Encounter  Medication Sig Dispense Refill   acetaminophen (TYLENOL) 500 MG tablet Take 1 tablet (500 mg total) by mouth 3 (three) times daily. 30 tablet 0   ALPRAZolam (XANAX) 0.5 MG tablet Take 1 tablet (0.5 mg total) by mouth at bedtime as needed. 30 tablet 0   aspirin EC 81 MG tablet Take 1 tablet (81 mg total) by mouth daily. Swallow whole. 30 tablet 11   Biotin 5 MG TABS Take 5 mg by mouth daily.     bisacodyl (DULCOLAX) 10 MG suppository Place 10 mg rectally as needed for moderate constipation.     cephALEXin (KEFLEX) 500 MG capsule Take 500 mg by mouth 3 (three) times daily.     ciprofloxacin (CIPRO) 500 MG tablet Take 500 mg by mouth 2 (two) times daily.     clobetasol (TEMOVATE) 0.05 % external solution Apply topically.     feeding supplement (ENSURE ENLIVE / ENSURE PLUS) LIQD Take 237 mLs by mouth 2 (two) times daily between meals. 237 mL 12   fluconazole (DIFLUCAN) 150 MG tablet Take 150 mg by mouth once.     fluticasone (FLONASE) 50 MCG/ACT nasal spray Place 1 spray into both nostrils daily.     GNP 8 HOUR ARTHRITIS RELIEF  650 MG CR tablet Take 650 mg by mouth 3 (three) times daily.     HYDROcodone-acetaminophen (NORCO/VICODIN) 5-325 MG tablet Take 1 tablet by mouth daily.     hydrocortisone (ANUSOL-HC) 25 MG suppository Place 1 suppository (25 mg total) rectally at bedtime. 5 suppository 0   lisinopril (ZESTRIL) 10 MG tablet Take 10 mg by mouth daily.     loratadine (CLARITIN) 10 MG tablet Take 10 mg by mouth every other day.      meclizine (ANTIVERT) 12.5 MG tablet Take 12.5 mg by mouth 2 (two) times daily as needed for dizziness.     Multiple  Minerals-Vitamins (CALCIUM-MAGNESIUM-ZINC-D3 PO) Take 1 tablet by mouth daily.      Multiple Vitamin (MULTIVITAMIN WITH MINERALS) TABS tablet Take 1 tablet by mouth daily.     nystatin cream (MYCOSTATIN) Apply 1 Application topically 2 (two) times daily.     pantoprazole (PROTONIX) 40 MG tablet Take 40 mg by mouth daily.     polyethylene glycol (MIRALAX / GLYCOLAX) 17 g packet Take 17 g by mouth 3 (three) times daily.     Sodium Fluoride (CLINPRO 5000) 1.1 % PSTE Take 1 Application by mouth at bedtime.     tamsulosin (FLOMAX) 0.4 MG CAPS capsule Take 0.4 mg by mouth daily.     tiZANidine (ZANAFLEX) 4 MG tablet Take 4 mg by mouth every 8 (eight) hours as needed for muscle spasms.     verapamil (VERELAN) 180 MG 24 hr capsule Take 180 mg by mouth daily.     Review of Systems  Unable to perform ROS: Acuity of condition   Significant Hospital Events: Including procedures, antibiotic start and stop dates in addition to other pertinent events   12/17 - admitted for large frontal ICH  Interim History / Subjective:  Vomited this morning, now on non-rebreather. Started on 3% saline.  Objective   Blood pressure (!) 122/54, pulse 95, temperature 98.8 F (37.1 C), temperature source Axillary, resp. rate 15, weight 44.1 kg, last menstrual period 06/27/1972, SpO2 97%.        Intake/Output Summary (Last 24 hours) at 06/13/2023 1151 Last data filed at 06/13/2023 0800 Gross per  24 hour  Intake 167.7 ml  Output 600 ml  Net -432.3 ml   Filed Weights   06/12/23 2000 06/12/23 2210  Weight: 60.6 kg 44.1 kg   Examination: General: frail appearing woman.  HENT: vomit on corner of mouth. Trachea midline Lungs: clear Cardiovascular: HS normal Abdomen: Soft Extremities: hammer toes Neuro: awake but not speaking or following commands. Responds to pain on the left and moves left hand spontaneously. No movement in right upper extremity.  GU: Purewick in place.   Ancillary Tests Personally Reviewed:  Hyponatremia 130 CT shows left cortical bleed with midline shift  Assessment & Plan:   Acute hypoxic respiratory failure due to recurrent aspiration from large ICH ICH likely due to cerebral amyloid angiopathy based on location.  Prior stroke with frailty.  HTN  Plan:   - Titrate O2 to keep saturation >92%. No BiPAP given aspiration. Have confirmed no intubation or CPR with son. If breathing worsens, will transition to comfort care.  -Keep SBP 130-150 to prevent rebleeding.  - 3% saline to reduce edema and midline shift. Target Na 150-155.  - EEG done to rule out NCSE - read is pending.    Best Practice (right click and "Reselect all SmartList Selections" daily)   Diet/type: NPO DVT prophylaxis SCD Pressure ulcer(s): N/A GI prophylaxis: N/A Lines: N/A Foley:  N/A Code Status:  DNR Last date of multidisciplinary goals of care discussion [spoke to son 12/17- poor pre-morbid quality of life - confirms DNR/DNI]  CRITICAL CARE Performed by: Lynnell Catalan   Total critical care time: 40 minutes  Critical care time was exclusive of separately billable procedures and treating other patients.  Critical care was necessary to treat or prevent imminent or life-threatening deterioration.  Critical care was time spent personally by me on the following activities: development of treatment plan with patient and/or surrogate as well as nursing, discussions with  consultants, evaluation of patient's response to treatment, examination of patient, obtaining  history from patient or surrogate, ordering and performing treatments and interventions, ordering and review of laboratory studies, ordering and review of radiographic studies, pulse oximetry, re-evaluation of patient's condition and participation in multidisciplinary rounds.  Lynnell Catalan, MD [facility] ICU Physician [facility] [place] [facility]  Pager: [phone] Mobile: [phone] After hours: [phone].

## 2023-06-13 NOTE — Progress Notes (Signed)
SLP Cancellation Note  Patient Details Name: Alison Cuevas MRN: [id number] DOB: Jun 12, 1946   Cancelled treatment:       Reason Eval/Treat Not Completed: Patient's level of consciousness. Repositioned pt and attempted to wake her with minimal response. Also about to begin procedure. Will f/u for readiness.    Kourtney Montesinos, Riley Nearing 06/13/2023, 9:21 AM

## 2023-06-13 NOTE — Procedures (Signed)
Patient Name: Alison Cuevas  MRN: [id number]  Epilepsy Attending: Charlsie Quest  Referring Physician/Provider: Marjorie Smolder, NP  Date: 06/13/2023  Duration: 26.53 mins  Patient history: 77 y.o. female with hx of GERD, HLD, HTN, DDD, prior L parietal ICH who presents with R sided weakness, L gaze, aphasia. Found to have L posterior frontal lobe ICH with IVH . EEG to evaluate for seizure  Level of alertness: Awake  AEDs during EEG study: None  Technical aspects: This EEG study was done with scalp electrodes positioned according to the 10-20 International system of electrode placement. Electrical activity was reviewed with band pass filter of 1-70Hz , sensitivity of 7 uV/mm, display speed of 28mm/sec with a 60Hz  notched filter applied as appropriate. EEG data were recorded continuously and digitally stored.  Video monitoring was available and reviewed as appropriate.  Description: No clear posterior rhythm seen.  EEG showed continuous generalized and maximal bifrontal 3 to 5 Hz theta-delta slowing. Hyperventilation and photic stimulation were not performed.     Of note, parts of study were difficult interpret due to significant movement artifact.  ABNORMALITY -Continuous slow, generalized and maximal bifrontal  IMPRESSION: This technically difficult study suggestive of cortical dysfunction in bifrontal region likely secondary to underlying structural abnormality.  Additionally there is moderate to severe diffuse encephalopathy.  No seizures or definite epileptiform discharges were noted.  Bertin Inabinet Annabelle Harman

## 2023-06-13 NOTE — Progress Notes (Signed)
Pharmacy Antibiotic Note  Alison Cuevas is a 77 y.o. female admitted on 06/12/2023 with  aspiration pneumonia .  Pharmacy has been consulted for Unasyn dosing.  Plan: Unasyn 3g q6h.  Follow culture data for de-escalation.  Monitor renal function for dose adjustments as indicated.   Weight: 44.1 kg (97 lb 3.6 oz)  Temp (24hrs), Avg:98.4 F (36.9 C), Min:98 F (36.7 C), Max:98.8 F (37.1 C)  Recent Labs  Lab 06/12/23 2049 06/12/23 2050  WBC 5.8  --   CREATININE 0.67 0.60    Estimated Creatinine Clearance: 40.3 mL/min (by C-G formula based on SCr of 0.6 mg/dL).    Allergies  Allergen Reactions   Gabapentin Anxiety    Elevated heart rate and crazy dreams   Prednisone Anxiety and Other (See Comments)    REACTION: funny feeling    Thank you for allowing pharmacy to be a part of this patient's care.  Estill Batten, PharmD, BCCCP  06/13/2023 12:41 PM

## 2023-06-13 NOTE — Progress Notes (Signed)
OT Cancellation Note  Patient Details Name: Alison Cuevas MRN: [id number] DOB: 09-12-1945   Cancelled Treatment:    Reason Eval/Treat Not Completed: Active bedrest order  Mateo Flow 06/13/2023, 2:37 PM

## 2023-06-13 NOTE — Progress Notes (Signed)
  Echocardiogram 2D Echocardiogram has been performed.  Leda Roys RDCS 06/13/2023, 9:44 AM

## 2023-06-13 NOTE — Progress Notes (Addendum)
STROKE TEAM PROGRESS NOTE   BRIEF HPI Ms. Epsie Wempe is a 77 y.o. female with history of GERD, hyperlipidemia, hypertension, degenerative disc disease and prior left parietal ICH presenting with right-sided weakness, left gaze deviation and aphasia.  She was noted to have sudden onset of the symptoms at her assisted living facility around 1920 last night.  EMS was called, and she was brought to the ED emergency department and found to have a large left posterior frontal lobe ICH.  Some expansion was noted on the ICH on follow-up CT in the morning.  Due to poor exam on 12/17, hypertonic saline was started.  NIH on Admission 27  ICH score 2  SIGNIFICANT HOSPITAL EVENTS 12/16-patient admitted with large left frontal ICH 12/17-hypertonic saline started  INTERIM HISTORY/SUBJECTIVE Patient has remained afebrile overnight but requires Cleviprex to keep blood pressure within parameters.  Due to poor exam and increase in midline shift on CT overnight, will start hypertonic saline today. Patient developed some respiratory distress after rounds.  Will consult critical care team to help manage.  Long discussion with son at the bedside and patient is DNR would not want intubation OBJECTIVE  CBC    Component Value Date/Time   WBC 5.8 06/12/2023 2049   RBC 4.11 06/12/2023 2049   HGB 12.6 06/12/2023 2050   HCT 37.0 06/12/2023 2050   PLT 212 06/12/2023 2049   MCV 89.5 06/12/2023 2049   MCH 31.1 06/12/2023 2049   MCHC 34.8 06/12/2023 2049   RDW 12.3 06/12/2023 2049   LYMPHSABS 3.0 06/12/2023 2049   MONOABS 0.5 06/12/2023 2049   EOSABS 0.1 06/12/2023 2049   BASOSABS 0.0 06/12/2023 2049    BMET    Component Value Date/Time   NA 131 (L) 06/13/2023 1109   K 4.1 06/12/2023 2050   CL 95 (L) 06/12/2023 2050   CO2 22 06/12/2023 2049   GLUCOSE 107 (H) 06/12/2023 2050   BUN 15 06/12/2023 2050   CREATININE 0.60 06/12/2023 2050   CREATININE 0.67 11/06/2020 1640   CALCIUM 9.2 06/12/2023 2049    GFRNONAA >60 06/12/2023 2049    IMAGING past 24 hours EEG adult Result Date: 06/13/2023 Charlsie Quest, MD     06/13/2023 12:30 PM Patient Name: Shriley Cane MRN: [id number] Epilepsy Attending: Charlsie Quest Referring Physician/Provider: Marjorie Smolder, NP Date: 06/13/2023 Duration: 26.53 mins Patient history: 77 y.o. female with hx of GERD, HLD, HTN, DDD, prior L parietal ICH who presents with R sided weakness, L gaze, aphasia. Found to have L posterior frontal lobe ICH with IVH . EEG to evaluate for seizure Level of alertness: Awake AEDs during EEG study: None Technical aspects: This EEG study was done with scalp electrodes positioned according to the 10-20 International system of electrode placement. Electrical activity was reviewed with band pass filter of 1-70Hz , sensitivity of 7 uV/mm, display speed of 40mm/sec with a 60Hz  notched filter applied as appropriate. EEG data were recorded continuously and digitally stored.  Video monitoring was available and reviewed as appropriate. Description: No clear posterior rhythm seen.  EEG showed continuous generalized and maximal bifrontal 3 to 5 Hz theta-delta slowing. Hyperventilation and photic stimulation were not performed.   Of note, parts of study were difficult interpret due to significant movement artifact. ABNORMALITY -Continuous slow, generalized and maximal bifrontal IMPRESSION: This technically difficult study suggestive of cortical dysfunction in bifrontal region likely secondary to underlying structural abnormality.  Additionally there is moderate to severe diffuse encephalopathy.  No seizures or definite  epileptiform discharges were noted. Charlsie Quest   ECHOCARDIOGRAM COMPLETE Result Date: 06/13/2023    ECHOCARDIOGRAM REPORT   Patient Name:   Joselinne Pritt Date of Exam: 06/13/2023 Medical Rec #:  [id number]     Height:       60.0 in Accession #:    [id number]    Weight:       97.2 lb Date of Birth:  Nov 08, 1945      BSA:           1.374 m Patient Age:    61 years      BP:           137/69 mmHg Patient Gender: F             HR:           100 bpm. Exam Location:  Inpatient Procedure: 2D Echo, Color Doppler and Cardiac Doppler Indications:    Stroke I63.9  History:        Patient has prior history of Echocardiogram examinations, most                 recent 01/27/2020. Stroke; Risk Factors:Hypertension and                 Dyslipidemia.  Sonographer:    Harriette Bouillon RDCS Referring Phys: [id number] [facility] IMPRESSIONS  1. Left ventricular ejection fraction, by estimation, is 60 to 65%. The left ventricle has normal function. The left ventricle has no regional wall motion abnormalities. There is mild concentric left ventricular hypertrophy. Left ventricular diastolic parameters are consistent with Grade I diastolic dysfunction (impaired relaxation).  2. Right ventricular systolic function is normal. The right ventricular size is normal.  3. The mitral valve is normal in structure. Trivial mitral valve regurgitation. No evidence of mitral stenosis.  4. The aortic valve is tricuspid. There is mild calcification of the aortic valve. Aortic valve regurgitation is not visualized. Aortic valve sclerosis/calcification is present, without any evidence of aortic stenosis.  5. The inferior vena cava is normal in size with greater than 50% respiratory variability, suggesting right atrial pressure of 3 mmHg. FINDINGS  Left Ventricle: Left ventricular ejection fraction, by estimation, is 60 to 65%. The left ventricle has normal function. The left ventricle has no regional wall motion abnormalities. The left ventricular internal cavity size was normal in size. There is  mild concentric left ventricular hypertrophy. Left ventricular diastolic parameters are consistent with Grade I diastolic dysfunction (impaired relaxation). Right Ventricle: The right ventricular size is normal. No increase in right ventricular wall thickness. Right ventricular systolic  function is normal. Left Atrium: Left atrial size was normal in size. Right Atrium: Right atrial size was normal in size. Pericardium: There is no evidence of pericardial effusion. Mitral Valve: The mitral valve is normal in structure. Trivial mitral valve regurgitation. No evidence of mitral valve stenosis. Tricuspid Valve: The tricuspid valve is normal in structure. Tricuspid valve regurgitation is trivial. No evidence of tricuspid stenosis. Aortic Valve: The aortic valve is tricuspid. There is mild calcification of the aortic valve. Aortic valve regurgitation is not visualized. Aortic valve sclerosis/calcification is present, without any evidence of aortic stenosis. Pulmonic Valve: The pulmonic valve was normal in structure. Pulmonic valve regurgitation is trivial. No evidence of pulmonic stenosis. Aorta: The aortic root is normal in size and structure. Venous: The inferior vena cava is normal in size with greater than 50% respiratory variability, suggesting right atrial pressure of 3 mmHg. IAS/Shunts: No atrial level shunt detected  by color flow Doppler.  LEFT VENTRICLE PLAX 2D LVIDd:         3.90 cm   Diastology LVIDs:         2.70 cm   LV e' lateral: 6.96 cm/s LV PW:         1.00 cm LV IVS:        1.10 cm LVOT diam:     1.80 cm LV SV:         68 LV SV Index:   49 LVOT Area:     2.54 cm  RIGHT VENTRICLE             IVC RV S prime:     16.70 cm/s  IVC diam: 1.80 cm TAPSE (M-mode): 2.4 cm LEFT ATRIUM           Index LA diam:      2.80 cm 2.04 cm/m LA Vol (A4C): 25.5 ml 18.56 ml/m  AORTIC VALVE LVOT Vmax:   150.00 cm/s LVOT Vmean:  102.000 cm/s LVOT VTI:    0.267 m  AORTA Ao Root diam: 3.20 cm Ao Asc diam:  3.40 cm  SHUNTS Systemic VTI:  0.27 m Systemic Diam: 1.80 cm Arvilla Meres MD Electronically signed by Arvilla Meres MD Signature Date/Time: 06/13/2023/9:47:39 AM    Final    CT HEAD WO CONTRAST Result Date: 06/13/2023 CLINICAL DATA:  Stroke follow-up EXAM: CT HEAD WITHOUT CONTRAST TECHNIQUE:  Contiguous axial images were obtained from the base of the skull through the vertex without intravenous contrast. RADIATION DOSE REDUCTION: This exam was performed according to the departmental dose-optimization program which includes automated exposure control, adjustment of the mA and/or kV according to patient size and/or use of iterative reconstruction technique. COMPARISON:  Yesterday FINDINGS: Brain: Acute parenchymal hemorrhage centered in the posterior left frontal lobe with rim of edema. Especially laterally there is increase in blood clot, dimensions at this level on 2:21 measure 5.1 x 3.9 cm as compared to 4.4 x 3.5 cm previously. There is intraventricular blood clot with increase, now seen in the right lateral ventricle. Newly seen subarachnoid hemorrhage along the right sylvian fissure, favor redistribution. Midline shift measures up to 5 mm, measurement taken on coronal reformats given head obliquity. Small chronic right cerebellar infarcts. No hydrocephalus. Vascular: No hyperdense vessel or unexpected calcification. Skull: Normal. Negative for fracture or focal lesion. Sinuses/Orbits: Right mastoidectomy which is clear. Chronic left sphenoid sinusitis with complete opacification and sclerotic wall thickening. IMPRESSION: Large left cerebral ICH with increased clot in both the parenchyma and ventricular system. Midline shift measures 5 mm. No hydrocephalus. Electronically Signed   By: Tiburcio Pea M.D.   On: 06/13/2023 08:03   CT ANGIO HEAD NECK W WO CM (CODE STROKE) Result Date: 06/12/2023 CLINICAL DATA:  Right-sided weakness EXAM: CT ANGIOGRAPHY HEAD AND NECK WITH AND WITHOUT CONTRAST TECHNIQUE: Multidetector CT imaging of the head and neck was performed using the standard protocol during bolus administration of intravenous contrast. Multiplanar CT image reconstructions and MIPs were obtained to evaluate the vascular anatomy. Carotid stenosis measurements (when applicable) are obtained  utilizing NASCET criteria, using the distal internal carotid diameter as the denominator. RADIATION DOSE REDUCTION: This exam was performed according to the departmental dose-optimization program which includes automated exposure control, adjustment of the mA and/or kV according to patient size and/or use of iterative reconstruction technique. CONTRAST:  75mL OMNIPAQUE IOHEXOL 350 MG/ML SOLN COMPARISON:  Same day CT head FINDINGS: CT HEAD FINDINGS See same day CT head for intracranial  findings CTA NECK FINDINGS Aortic arch: Standard branching. Imaged portion shows no evidence of aneurysm or dissection. No significant stenosis of the major arch vessel origins. Right carotid system: No evidence of dissection, stenosis (50% or greater), or occlusion. Left carotid system: No evidence of dissection, stenosis (50% or greater), or occlusion. Vertebral arteries: Left dominant. No evidence of dissection, stenosis (50% or greater), or occlusion. Skeleton: Status post C3-C6 ACDF Other neck: Assessment of the cervical soft tissues is limited due to the degree of motion. Upper chest: Negative. Review of the MIP images confirms the above findings CTA HEAD FINDINGS Anterior circulation: No significant stenosis, proximal occlusion, aneurysm, or vascular malformation. Posterior circulation: No significant stenosis, proximal occlusion, aneurysm, or vascular malformation. Venous sinuses: As permitted by contrast timing, patent. Anatomic variants: Fetal PCA on the left Review of the MIP images confirms the above findings IMPRESSION: 1. No intracranial large vessel occlusion or significant stenosis. 2. No hemodynamically significant stenosis in the neck. 3. See same day CT head for intracranial findings. Electronically Signed   By: Lorenza Cambridge M.D.   On: 06/12/2023 21:16   CT HEAD CODE STROKE WO CONTRAST Result Date: 06/12/2023 CLINICAL DATA:  Code stroke.  Right-sided weakness EXAM: CT HEAD WITHOUT CONTRAST TECHNIQUE: Contiguous  axial images were obtained from the base of the skull through the vertex without intravenous contrast. RADIATION DOSE REDUCTION: This exam was performed according to the departmental dose-optimization program which includes automated exposure control, adjustment of the mA and/or kV according to patient size and/or use of iterative reconstruction technique. COMPARISON:  CT head 11/20/22 FINDINGS: Brain: 4.7 x 4.1 x 4.9 cm left posterior frontal lobe parenchymal hemorrhage with a hematocrit level and intraventricular extension. There are layering blood products in the left occipital horn. There is 3 mm rightward midline shift. There is a background moderate chronic microvascular ischemic change. Vascular: No hyperdense vessel or unexpected calcification. Skull: Normal. Negative for fracture or focal lesion. Sinuses/Orbits: Small right mastoid effusion. Postsurgical changes from a wall up mastoidectomy. No middle ear effusion. Paranasal sinuses are notable for complete opacification of the left sphenoid sinus. Orbits are unremarkable. Other: None. IMPRESSION: 4.7 x 4.1 x 4.9 cm left posterior frontal lobe parenchymal hemorrhage with intraventricular extension. 3 mm rightward midline shift. Findings were paged to Dr. Derry Lory on 06/12/23 at 8:59 PM. Electronically Signed   By: Lorenza Cambridge M.D.   On: 06/12/2023 21:01    Vitals:   06/13/23 0900 06/13/23 1000 06/13/23 1100 06/13/23 1200  BP: 136/63 (!) 129/56 (!) 126/55 (!) 129/58  Pulse: 100 97 98 (!) 104  Resp: 18 16 16  (!) 23  Temp:    98.7 F (37.1 C)  TempSrc:    Axillary  SpO2: 96% 97% 96% 91%  Weight:         PHYSICAL EXAM General: Drowsy, frail-appearing elderly patient in no acute distress CV: Regular rate and rhythm on monitor Respiratory:  Regular, unlabored respirations   NEURO:  Patient rests with eyes closed and does not respond to voice or follow commands.  Globally aphasic and mute.  Pupils are equal round and reactive,  oculocephalic reflex is present.  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 Etiology: Hypertensive versus indeterminate  code Stroke CT head left posterior frontal lobe parenchymal hemorrhage with intraventricular extension and 3 mm of rightward midline  shift CTA head & neck no LVO or hemodynamically significant stenosis, no evidence of AVM or aneurysm Follow-up CT 12/17: Slightly larger left cerebral ICH with increased clot in the parenchyma and ventricular system and 5 mm of midline shift, no hydrocephalus MRI pending 2D Echo EF 60 to 65%, grade 1 diastolic dysfunction, normal left atrial size, no atrial level shunt LDL 87 HgbA1c No results found for requested labs within last 1095 days. EEG reveals cortical dysfunction in bifrontal region with moderate to severe diffuse encephalopathy and no seizures or epileptiform discharges VTE prophylaxis -SCDs aspirin 81 mg daily prior to admission, now on No antithrombotic secondary to IPH Therapy recommendations:  Pending Disposition: Pending  Hx of Stroke/TIA Patient has a history of left frontoparietal hemorrhage in 2021, likely hypertensive or hemorrhagic infarct  Cerebral edema (brain compression) Patient has increasing midline shift of 5 mm seen on follow-up CT overnight We will start 3% hypertonic saline with 250 cc bolus to begin at 75 cc/h Sodium goal 150-155 Monitor sodium every 6 hours, last 131  Aspiration pneumonia Patient noted to have emesis overnight with aspiration Started on Unasyn 12/17  Hypertension Home meds: Lisinopril 10 mg daily, verapamil 180 mg daily Unstable, requiring Cleviprex We will start as needed labetalol and hydralazine Blood Pressure Goal: SBP between 130-150 for 24 hours and then less than 160    Hyperlipidemia Home meds: None LDL 87, goal < 70 Will start statin at discharge  Dysphagia Patient has post-stroke dysphagia, SLP consulted    Diet   Diet NPO time specified   Advance diet as tolerated  Other Stroke Risk Factors None   Other Active Problems None  Hospital day # 1  Patient seen by NP with MD, MD to edit note is needed. Cortney E Ernestina Columbia , MSN, AGACNP-BC [facility] See Amion for schedule and pager information 06/13/2023 1:20 PM   STROKE MD NOTE :  I have personally obtained history,examined this patient, reviewed notes, independently viewed imaging studies, participated in medical decision making and plan of care.ROS completed by me personally and pertinent positives fully documented  I have made any additions or clarifications directly to the above note. Agree with note above.  Patient presented with aphasia right hemiparesis due to large left frontal cortical and subcortical hemorrhage etiology indeterminate hypertensive versus hemorrhagic infarct.  Interestingly she had similar presentation a few years ago with a small hemorrhage in the same location.  Prognosis is guarded.  She vomited last night and may be at risk for aspiration pneumonia.  Long discussion at the bedside with the patient's son stroke is clear patient is DNR and would not want intubation.  We will consult critical care team to help manage respiratory status.  Start hypertonic saline with serum sodium goal 150-155.  Strict blood pressure control with systolic goal 130-150 for the first 24 hours and then below 160.  Discussed with Dr. Denese Killings critical care medicine This patient is critically ill and at significant risk of neurological worsening, death and care requires constant monitoring of vital signs, hemodynamics,respiratory and cardiac monitoring, extensive review of multiple databases, frequent neurological assessment, discussion with family, other specialists and medical  decision making of high complexity.I have made any additions or clarifications directly to the above note.This critical care time does not reflect procedure time, or teaching time or supervisory time of PA/NP/Med Resident etc but could involve care discussion time.  I spent 30 minutes of neurocritical care time  in the care of  this patient.  Delia Heady, MD Medical Director [facility] Pager: [phone] 06/13/2023 3:16 PM   To contact Stroke Continuity provider, please refer to [email or web address]. After hours, contact General Neurology

## 2023-06-13 NOTE — TOC CM/SW Note (Addendum)
Transition of Care [facility]) - Inpatient Brief Assessment   Patient Details  Name: Alison Cuevas MRN: [id number] Date of Birth: Mar 27, 1946  [facility] CM/SW Contact:    Mearl Latin, LCSW Phone Number: 06/13/2023, 9:36 AM   Clinical Narrative: Patient admitted from [facility] ALF undergoing stroke workup. TOC following for potential therapy needs at discharge. Patient completed rehab at [facility] back in May.    Transition of Care Asessment: Insurance and Status: Insurance coverage has been reviewed Patient has primary care physician: Yes Home environment has been reviewed: From home Prior level of function:: Mod Indep Prior/Current Home Services: No current home services Social Drivers of Health Review: SDOH reviewed no interventions necessary Readmission risk has been reviewed: Yes Transition of care needs: transition of care needs identified, TOC will continue to follow

## 2023-06-13 NOTE — Progress Notes (Signed)
2300: Pt 2x episodes of emesis. Dr. Derry Lory was notified. New order for zofran 4mg  iv.  0300: pt experienced another episode of emesis despite receiving zofran. Notified Dr. Derry Lory. New order for compazine 10 mg iv.   Octavia Bruckner RN

## 2023-06-13 NOTE — Progress Notes (Signed)
PT Cancellation Note  Patient Details Name: Alison Cuevas MRN: [id number] DOB: 1946-02-20   Cancelled Treatment:    Reason Eval/Treat Not Completed: Active bedrest order; will follow up when stable for mobility.   Elray Mcgregor 06/13/2023, 9:16 AM Sheran Lawless, PT Acute Rehabilitation Services [phone] 06/13/2023

## 2023-06-14 ENCOUNTER — Inpatient Hospital Stay (HOSPITAL_COMMUNITY): Payer: Medicare Other

## 2023-06-14 DIAGNOSIS — Z7189 Other specified counseling: Secondary | ICD-10-CM | POA: Diagnosis not present

## 2023-06-14 DIAGNOSIS — J9601 Acute respiratory failure with hypoxia: Secondary | ICD-10-CM

## 2023-06-14 DIAGNOSIS — Z515 Encounter for palliative care: Secondary | ICD-10-CM | POA: Diagnosis not present

## 2023-06-14 DIAGNOSIS — J690 Pneumonitis due to inhalation of food and vomit: Secondary | ICD-10-CM

## 2023-06-14 DIAGNOSIS — I619 Nontraumatic intracerebral hemorrhage, unspecified: Secondary | ICD-10-CM | POA: Diagnosis not present

## 2023-06-14 LAB — HEMOGLOBIN A1C
Hgb A1c MFr Bld: 4.7 % — ABNORMAL LOW (ref 4.8–5.6)
Mean Plasma Glucose: 88.19 mg/dL

## 2023-06-14 LAB — SODIUM: Sodium: 150 mmol/L — ABNORMAL HIGH (ref 135–145)

## 2023-06-14 LAB — LIPID PANEL
Cholesterol: 158 mg/dL (ref 0–200)
HDL: 84 mg/dL (ref >40)
LDL Cholesterol: 66 mg/dL (ref 0–99)
Total CHOL/HDL Ratio: 1.9 {ratio}
Triglycerides: 38 mg/dL (ref <150)
VLDL: 8 mg/dL (ref 0–40)

## 2023-06-14 MED ORDER — GLYCOPYRROLATE 0.2 MG/ML IJ SOLN: 0.2000 mg | INTRAMUSCULAR | Status: DC | PRN

## 2023-06-14 MED ORDER — ACETAMINOPHEN 325 MG PO TABS: 650.0000 mg | ORAL_TABLET | Freq: Four times a day (QID) | ORAL | Status: DC | PRN

## 2023-06-14 MED ORDER — MORPHINE BOLUS VIA INFUSION
5.0000 mg | INTRAVENOUS | Status: DC | PRN
  Administered 2023-06-15 – 2023-06-16 (×2): 5 mg via INTRAVENOUS

## 2023-06-14 MED ORDER — GLYCOPYRROLATE 0.2 MG/ML IJ SOLN
0.2000 mg | INTRAMUSCULAR | Status: DC | PRN
  Administered 2023-06-14 (×2): 0.2 mg via INTRAVENOUS
  Filled 2023-06-14 (×2): qty 1

## 2023-06-14 MED ORDER — SODIUM CHLORIDE 0.9 % IV SOLN: INTRAVENOUS | Status: AC

## 2023-06-14 MED ORDER — LORAZEPAM 2 MG/ML IJ SOLN
2.0000 mg | INTRAMUSCULAR | Status: DC | PRN
  Administered 2023-06-14 (×2): 2 mg via INTRAVENOUS
  Filled 2023-06-14 (×2): qty 1

## 2023-06-14 MED ORDER — MORPHINE 100MG IN NS 100ML (1MG/ML) PREMIX INFUSION
0.0000 mg/h | INTRAVENOUS | Status: DC
Start: 2023-06-14 — End: 2023-06-16
  Administered 2023-06-14: 5 mg/h via INTRAVENOUS
  Administered 2023-06-14 – 2023-06-16 (×4): 10 mg/h via INTRAVENOUS
  Filled 2023-06-14 (×5): qty 100

## 2023-06-14 MED ORDER — GLYCOPYRROLATE 1 MG PO TABS: 1.0000 mg | ORAL_TABLET | ORAL | Status: DC | PRN

## 2023-06-14 MED ORDER — LORAZEPAM 2 MG/ML IJ SOLN
1.0000 mg | Freq: Once | INTRAMUSCULAR | Status: AC
  Administered 2023-06-14: 1 mg via INTRAVENOUS

## 2023-06-14 MED ORDER — SCOPOLAMINE 1 MG/3DAYS TD PT72
1.0000 | MEDICATED_PATCH | TRANSDERMAL | Status: DC
  Administered 2023-06-14: 1.5 mg via TRANSDERMAL
  Filled 2023-06-14: qty 1

## 2023-06-14 MED ORDER — LORAZEPAM 2 MG/ML IJ SOLN
INTRAMUSCULAR | Status: AC
  Filled 2023-06-14: qty 1

## 2023-06-14 MED ORDER — PROCHLORPERAZINE EDISYLATE 10 MG/2ML IJ SOLN: 5.0000 mg | Freq: Once | INTRAMUSCULAR | Status: DC | PRN

## 2023-06-14 MED ORDER — ACETAMINOPHEN 650 MG RE SUPP: 650.0000 mg | Freq: Four times a day (QID) | RECTAL | Status: DC | PRN

## 2023-06-14 MED ORDER — POLYVINYL ALCOHOL 1.4 % OP SOLN: 1.0000 [drp] | Freq: Four times a day (QID) | OPHTHALMIC | Status: DC | PRN

## 2023-06-14 NOTE — Progress Notes (Signed)
OT Cancellation Note  Patient Details Name: Alison Cuevas MRN: [id number] DOB: 05-07-1946   Cancelled Treatment:    Reason Eval/Treat Not Completed: Other (comment) (Discussed with RN, pt transitioning to comfort care. OT to sign off, please reorder if appropriate.)  Donia Pounds 06/14/2023, 10:44 AM

## 2023-06-14 NOTE — Progress Notes (Addendum)
STROKE TEAM PROGRESS NOTE   BRIEF HPI [name] is a 77 y.o. female with history of GERD, hyperlipidemia, hypertension, degenerative disc disease and prior left parietal ICH presenting with right-sided weakness, left gaze deviation and aphasia.  She was noted to have sudden onset of the symptoms at her assisted living facility around 1920 last night.  EMS was called, and she was brought to the ED emergency department and found to have a large left posterior frontal lobe ICH.  Some expansion was noted on the ICH on follow-up CT in the morning.  Due to poor exam on 12/17, hypertonic saline was started. 12/18 2 episodes of aspiration, decision made to pursue comfort care.   NIH on Admission 27  ICH score 2  SIGNIFICANT HOSPITAL EVENTS 12/16-patient admitted with large left frontal ICH 12/17-hypertonic saline started 12/18 - transition to comfort care  INTERIM HISTORY/SUBJECTIVE Family is awaiting patient placement at residential hospice in [place]. Hopeful for discharge tomorrow. Comfort care medications ordered.  Patient vomited again last night and must have aspirated.  This is more respiratory distress and now requiring nonrebreather facemask.  She is tachycardic and slightly tachypneic but maintaining oxygen sats.  Long discussion at the bedside with patient's 2 sons and daughter-in-law about her poor prognosis and family agreed to full comfort care measures. OBJECTIVE  CBC    Component Value Date/Time   WBC 5.8 06/12/2023 2049   RBC 4.11 06/12/2023 2049   HGB 12.6 06/12/2023 2050   HCT 37.0 06/12/2023 2050   PLT 212 06/12/2023 2049   MCV 89.5 06/12/2023 2049   MCH 31.1 06/12/2023 2049   MCHC 34.8 06/12/2023 2049   RDW 12.3 06/12/2023 2049   LYMPHSABS 3.0 06/12/2023 2049   MONOABS 0.5 06/12/2023 2049   EOSABS 0.1 06/12/2023 2049   BASOSABS 0.0 06/12/2023 2049    BMET    Component Value Date/Time   NA 150 (H) 06/14/2023 0605   K 4.1 06/12/2023 2050   CL 95 (L)  06/12/2023 2050   CO2 22 06/12/2023 2049   GLUCOSE 107 (H) 06/12/2023 2050   BUN 15 06/12/2023 2050   CREATININE 0.60 06/12/2023 2050   CREATININE 0.67 11/06/2020 1640   CALCIUM 9.2 06/12/2023 2049   GFRNONAA >60 06/12/2023 2049    IMAGING past 24 hours DG Chest Port 1 View Result Date: 06/14/2023 CLINICAL DATA:  Acute respiratory failure EXAM: PORTABLE CHEST 1 VIEW COMPARISON:  Yesterday FINDINGS: Retrocardiac opacity with obscured diaphragm. A small left pleural effusion is possible. Normal heart size and mediastinal contours. Artifact from EKG leads. IMPRESSION: History of aspiration with worsening infiltrate at the left base. Electronically Signed   By: Tiburcio Pea M.D.   On: 06/14/2023 08:07   MR BRAIN W WO CONTRAST Result Date: 06/13/2023 CLINICAL DATA:  Neuro deficit, stroke suspected EXAM: MRI HEAD WITHOUT AND WITH CONTRAST TECHNIQUE: Multiplanar, multiecho pulse sequences of the brain and surrounding structures were obtained without and with intravenous contrast. CONTRAST:  5mL GADAVIST GADOBUTROL 1 MMOL/ML IV SOLN COMPARISON:  MRI head 04/16/2020, CT head 06/13/2023 FINDINGS: Brain: Redemonstrated acute parenchymal hemorrhage centered in the posterior left frontal lobe, which measures approximately 5.0 x 5.5 x 5 4 cm, previously 4.6 x 5.1 x 5.0 cm when remeasured similarly. Areas of contrast enhancement within the hematoma (series 10, image 33 and 40), concerning for active extravasation. Mass effect from the hematoma itself causing up to 8 mm of left-to-right midline shift. Surrounding T2 hyperintense signal, likely edema. Hemorrhage extends into the lateral, third,  and fourth ventricles. Slight increase in the size of the ventricles compared to the 2021 exam, most prominent in the right temporal horn and left occipital horn. Diffuse subarachnoid hemorrhage, most prominently in the right sylvian fissure and right temporal lobe sulci. No evidence of underlying mass. Vascular: Grossly  normal arterial flow voids, although evaluation is somewhat limited by motion. Normal arterial and venous enhancement. Skull and upper cervical spine: Normal marrow signal. Sinuses/Orbits: Mucosal thickening in the left sphenoid sinus and bilateral ethmoid air cells. No acute finding in the orbits. Other: Fluid in the right-greater-than-left mastoid air cells. IMPRESSION: 1. Redemonstrated acute parenchymal hemorrhage centered in the posterior left frontal lobe, which measures approximately 5.0 x 5.5 x 5 4 cm, previously 4.6 x 5.1 x 5.0 cm when remeasured similarly. Areas of contrast enhancement within the hematoma, concerning for active extravasation. 2. Mass effect from the hematoma itself causing up to 8 mm of left-to-right midline shift. 3. Hemorrhage extends into the lateral, third, and fourth ventricles. Slight increase in the size of the ventricles compared to the 2021 exam, most prominent in the right temporal horn and left occipital horn, concerning for early obstructive hydrocephalus. 4. Diffuse subarachnoid hemorrhage, most prominently in the right Sylvian fissure and right temporal lobe sulci. These results will be called to the ordering clinician or representative by the Radiologist Assistant, and communication documented in the PACS or [employer]. Electronically Signed   By: Wiliam Ke M.D.   On: 06/13/2023 17:30    Vitals:   06/14/23 0700 06/14/23 0800 06/14/23 0900 06/14/23 1000  BP: (!) 128/93 (!) 145/66 (!) 156/118 (!) 141/71  Pulse: (!) 122 (!) 122 (!) 125 (!) 121  Resp: (!) 25 (!) 25 (!) 29 (!) 24  Temp:  99.1 F (37.3 C)    TempSrc:  Axillary    SpO2: 94% 93% 93% 95%  Weight:         PHYSICAL EXAM General: Drowsy, frail-appearing elderly patient in no acute distress CV: Tachycardic  Respiratory:  Nonrebreather in place  NEURO:  Patient rests with eyes closed and does not respond to voice or follow commands.  Globally aphasic and mute.  Pupils are equal round and  reactive, oculocephalic reflex is present.  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 Etiology: Hypertensive versus indeterminate  code Stroke CT head left posterior frontal lobe parenchymal hemorrhage with intraventricular extension and 3 mm of rightward midline shift CTA head & neck no LVO or hemodynamically significant stenosis, no evidence of AVM or aneurysm Follow-up CT 12/17: Slightly larger left cerebral ICH with increased clot in the parenchyma and ventricular system and 5 mm of midline shift, no hydrocephalus MRI pending 2D Echo EF 60 to 65%, grade 1 diastolic dysfunction, normal left atrial size, no atrial level shunt LDL 87 HgbA1c No results found for requested labs within last 1095 days. EEG reveals cortical dysfunction in bifrontal region with moderate to severe diffuse encephalopathy and no seizures or epileptiform discharges VTE prophylaxis -SCDs aspirin 81 mg daily prior to admission, now on No antithrombotic secondary to IPH Therapy recommendations: Comfort care Disposition: [facility]  Hx of Stroke/TIA Patient has a history of left frontoparietal hemorrhage in 2021, likely hypertensive or hemorrhagic infarct  Cerebral edema (brain compression) Patient has increasing midline shift of 5 mm seen on follow-up CT overnight 3%  hypertonic saline with 250 cc bolus with infusion at 75 cc/h - discontinued for comfort meausres  Aspiration pneumonia Patient noted to have emesis overnight with aspiration Started on Unasyn 12/17 - now discontinued for comfort care  Hypertension Home meds: Lisinopril 10 mg daily, verapamil 180 mg daily  Hyperlipidemia Home meds: None LDL 87, goal < 70  DNR/Comfort measures only Morphine infusion PRN ativan, Robinul Plan for  discharge to hospice center- [facility] and palliative assistance appreciated  Hospital day # 2  Patient seen and examined by NP/APP with MD. MD to update note as needed.   Elmer Picker, DNP, FNP-BC [facility] Pager: [phone]  I have personally obtained history,examined this patient, reviewed notes, independently viewed imaging studies, participated in medical decision making and plan of care.ROS completed by me personally and pertinent positives fully documented  I have made any additions or clarifications directly to the above note. Agree with note above.  Patient vomited again last night and likely aspirated and respiratory status is deteriorating.  Patient is DNR and family understands poor prognosis and have agreed to full comfort care measures.  Discussed with 2 sons and daughter-in-law and answered questions.  Discussed with Dr. Denese Killings critical care medicine.  Will start patient on morphine drip. This patient is critically ill and at significant risk of neurological worsening, death and care requires constant monitoring of vital signs, hemodynamics,respiratory and cardiac monitoring, extensive review of multiple databases, frequent neurological assessment, discussion with family, other specialists and medical decision making of high complexity.I have made any additions or clarifications directly to the above note.This critical care time does not reflect procedure time, or teaching time or supervisory time of PA/NP/Med Resident etc but could involve care discussion time.  I spent 30 minutes of neurocritical care time  in the care of  this patient.      Delia Heady, MD Medical Director [facility] Pager: [phone] 06/14/2023 3:03 PM    To contact Stroke Continuity provider, please refer to [email or web address]. After hours, contact General Neurology

## 2023-06-14 NOTE — TOC Initial Note (Signed)
Transition of Care [facility]) - Initial/Assessment Note    Patient Details  Name: Alison Cuevas MRN: [id number] Date of Birth: [birth year]  Transition [facility]) CM/SW Contact:    Glennon Mac, RN Phone Number: 06/14/2023, 12:00pm  Clinical Narrative:                 Lillyona Elley is a 77 y.o. female with hx of GERD, HLD, HTN, DDD, prior L parietal ICH who presents with R sided weakness, L gaze, aphasia. Was found to have a large posterior frontal ICH with midline shift.  PTA, pt resides at [facility] and is mostly wheelchair bound from a previous stroke.  TOC received order from PMT for home hospice with [facility], per family choice. Met with family and they initially were considering home with hospice and 24h paid caregivers, but after a short time changed their mind to residential hospice facility.   They prefer [facility] facility in [place].   Referral made to [facility]; she states currently no bed available at their residential facility, but patient is second on waiting list.  She states she will provide updates as available. Updated family and they are agreeable to plan of continuing comfort care in house until bed available with [facility] facility.  Will follow/provide updates when available.   Expected Discharge Plan: Hospice Medical Facility Barriers to Discharge: Hospice Bed not available   Patient Goals and CMS Choice   CMS Medicare.gov Compare Post Acute Care list provided to:: Patient Represenative (must comment) (adult children) Choice offered to / list presented to : Adult Children      Expected Discharge Plan and Services   Discharge Planning Services: CM Consult   Living arrangements for the past 2 months: Single Family Home                                      Prior Living Arrangements/Services Living arrangements for the past 2 months: Single Family Home Lives with:: Adult Children          Need for Family Participation in Patient Care:  Yes (Comment) Care giver support system in place?: Yes (comment) Current home services: DME Criminal Activity/Legal Involvement Pertinent to Current Situation/Hospitalization: No - Comment as needed                 Emotional Assessment   Attitude/Demeanor/Rapport: Unable to Assess Affect (typically observed): Unable to Assess        Admission diagnosis:  ICH (intracerebral hemorrhage) (HCC) [I61.9] Patient Active Problem List   Diagnosis Date Noted   Aspiration pneumonia of left lung (HCC) 06/14/2023   Acute respiratory failure with hypoxia (HCC) 06/14/2023   Edema of right ankle 12/19/2022   History of CVA (cerebrovascular accident) 11/28/2022   Chronic constipation 11/28/2022   Failure to thrive in adult 11/28/2022   Hyperlipidemia LDL goal <100 11/28/2022   Chronic non-seasonal allergic rhinitis 11/28/2022   Chronic vertigo 11/28/2022   Aortic atherosclerosis (HCC) 11/28/2022   Coronary atherosclerosis 11/28/2022   Flexion contracture of joint of foot, right 11/28/2022   Protein-calorie malnutrition, severe 11/24/2022   Acute metabolic encephalopathy 11/23/2022   Near syncope 11/22/2022   Thyroid nodule 07/08/2021   Thrombocytopenia, unspecified (HCC) 11/19/2020   Right spastic hemiparesis (HCC) 03/04/2020   Chronic otitis externa of right ear 02/20/2020   Intraparenchymal hemorrhage of brain (HCC)    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,  hypertensive 01/26/2020   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   Conductive hearing loss of right ear with unrestricted hearing of left ear 05/28/2019   Hyponatremia 08/31/2017   Generalized weakness 08/31/2017   UTI (urinary tract infection) 08/31/2017   Right ovarian cyst 08/22/2016   Fibromyalgia 06/03/2015   Cystocele 03/18/2014   Diverticulosis of colon without hemorrhage 06/12/2013   Hypertension    Osteopenia    Chronic low back pain 05/12/2011   Anxiety state 09/17/2007   GERD 09/17/2007   DJD  (degenerative joint disease) 09/17/2007   PCP:  Eloisa Northern, MD Pharmacy:   [facility] [phone] - [place], [place] - [address] [place] [address] Phone: [phone] Fax: [phone]  [facility] - [place], [place] - [address] [place] [address] Phone: [phone] Fax: [phone]  [facility] - [place], [place] - [address] [place] [address] Phone: [phone] Fax: [phone]     Social Drivers of Health (SDOH) Social History: SDOH Screenings   Food Insecurity: No Food Insecurity (06/13/2023)  Housing: Low Risk  (06/13/2023)  Transportation Needs: No Transportation Needs (06/13/2023)  Utilities: Not At Risk (06/13/2023)  Alcohol Screen: Low Risk  (07/20/2022)  Depression (PHQ2-9): Low Risk  (07/20/2022)  Financial Resource Strain: Low Risk  (07/20/2022)  Physical Activity: Sufficiently Active (07/20/2022)  Social Connections: Moderately Isolated (07/20/2022)  Stress: No Stress Concern Present (07/20/2022)  Tobacco Use: Low Risk  (06/12/2023)   SDOH Interventions:     Readmission Risk Interventions     No data to display         Quintella Baton, RN, BSN  Trauma/Neuro ICU Case Manager [phone]

## 2023-06-14 NOTE — Consult Note (Signed)
Palliative Medicine Inpatient Consult Note  Consulting Provider:  Lynnell Catalan, MD    Reason for consult:   Palliative Care Consult Services Palliative Medicine Consult  Reason for Consult? home hospice   06/14/2023  HPI:  Per intake H&P --> Makenzye Englehart is a 77 y.o. female with hx of GERD, HLD, HTN, DDD, prior L parietal ICH who presents with R sided weakness, L gaze, aphasia. Was found to have a large posterior frontal ICH with midline shift.   Palliative care asked to get involved to further support conversations related to hospice at home versus residential setting.   Clinical Assessment/Goals of Care:  *Please note that this is a verbal dictation therefore any spelling or grammatical errors are due to the "Dragon Medical One" system interpretation.  I have reviewed medical records including EPIC notes, labs and imaging, received report from bedside RN, assessed the patient who is lying in bed with a NRB FM on.    I met with patients son(s), DIL(s), and granddaughter, and daughter via E-Link to further discuss diagnosis prognosis, GOC, EOL wishes, disposition and options.   I introduced Palliative Medicine as specialized medical care for people living with serious illness. It focuses on providing relief from the symptoms and stress of a serious illness. The goal is to improve quality of life for both the patient and the family.  Medical History Review and Understanding:  A review of Lougenia's history inclusive of HLD, HTN, DDD, two previous strokes, OP, and syncope was completed.   Social History:  Leonela is from the [place] area originally. She is a widow. She has three children, multiple grandchildren, and multiple great grandchildren. 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 She is know to have been very stubborn and often busy, She is a person of strong [place] faith.   Functional and  Nutritional State:  Prior to hospitalization Dashanay was living at [facility] assisted living facility. She was in a wheelchair though had some function. She was still able to eat and drink though remained to have right sided weakened from her prior stroke.  Advance Directives:  A detailed discussion was had today regarding advanced directives.  Patients children are her surrogate decision makers.   Code Status:  Concepts specific to code status, artifical feeding and hydration, continued IV antibiotics and rehospitalization was had.  The difference between a aggressive medical intervention path  and a palliative comfort care path for this patient at this time was had.   Patient is an established DNAR/DNI code status.   Discussion:  Patients family are understanding of patients present clinical condition. They share awareness that Carmen has suffered a large stroke. They understand her ongoing risk of aspiration. The have opted to pursue comfort focused measures.  Patients family is hopeful to get her home with hospice services and 24/7 care though they are not sure how to navigivate this. We discussed reaching out to [facility] to see if this would be possible. We discussed an alternative plan of Lilymae going to [facility] hospice home if unable to coordinate home hospice.   TOC made aware.  Reflected on patients life and the type of woman she was.   Discussed the importance of continued conversation with family and their  medical providers regarding overall plan of care and treatment options, ensuring decisions are within the context of the patients values and GOCs.  Decision Maker: Willaim Bane (Daughter): [phone] [facility] Phone)  SUMMARY OF RECOMMENDATIONS   DNAR/DNI  Comfort Care  On morphine gtt with boluses PRN  Additional comfort medications per [facility]  Unrestricted visitation  Appreciate TOC helping with options for placement --> Home with Hospice and 24/7 support versus  [facility]  Ongoing PMT support  Code Status/Advance Care Planning: DNAR/DNI  Palliative Prophylaxis:  Aspiration, Bowel Regimen, Delirium Protocol, Frequent Pain Assessment, Oral Care, Palliative Wound Care, and Turn Reposition  Additional Recommendations (Limitations, Scope, Preferences): Continue current care  Psycho-social/Spiritual:  Desire for further Chaplaincy support: Yes Additional Recommendations: Education on EOL.   Prognosis: Limited to days.  Discharge Planning: Discharge plan to be determined.   Vitals:   06/14/23 0900 06/14/23 1000  BP: (!) 156/118 (!) 141/71  Pulse: (!) 125 (!) 121  Resp: (!) 29 (!) 24  Temp:    SpO2: 93% 95%    Intake/Output Summary (Last 24 hours) at 06/14/2023 1155 Last data filed at 06/14/2023 0800 Gross per 24 hour  Intake 1655.33 ml  Output 3050 ml  Net -1394.67 ml   Last Weight  Most recent update: 06/12/2023 10:41 PM    Weight  44.1 kg (97 lb 3.6 oz)            Gen:  Frail elderly F critically ill appearing HEENT: Dry mucous membranes CV: Irregular rate and rhythm  PULM: On 15LPM NRB FM ABD: soft/nontender  EXT: No edema  Neuro: Somnolent  PPS: 10%   This conversation/these recommendations were discussed with patient primary care team, [employer] APP  Billing based on MDM: High ______________________________________________________ Lamarr Lulas [place] Palliative Medicine Team Team Cell Phone: [phone] Please utilize secure chat with additional questions, if there is no response within 30 minutes please call the above phone number  Palliative Medicine Team providers are available by phone from 7am to 7pm daily and can be reached through the team cell phone.  Should this patient require assistance outside of these hours, please call the patient's attending physician.

## 2023-06-14 NOTE — Progress Notes (Signed)
Patient had an episode of what sounded like vomiting, however her mouth was clamped shut tight and sounded like she was swallowing very loudly, whatever was coming up. Tried to open mouth to suction and could not. She got very tachycardic in the 130's and her O2 sats dropped into low 80's, non-rebreather placed and zofran given. Lungs are clear to auscultation.

## 2023-06-14 NOTE — Discharge Summary (Incomplete)
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 Etiology: Hypertensive versus indeterminate    Patient Active Problem List   Diagnosis Date Noted   Aspiration pneumonia of left lung (HCC) 06/14/2023   Acute respiratory failure with hypoxia (HCC) 06/14/2023   Edema of right ankle 12/19/2022   History of CVA (cerebrovascular accident) 11/28/2022   Chronic constipation 11/28/2022   Failure to thrive in adult 11/28/2022   Hyperlipidemia LDL goal <100 11/28/2022   Chronic non-seasonal allergic rhinitis 11/28/2022   Chronic vertigo 11/28/2022   Aortic atherosclerosis (HCC) 11/28/2022   Coronary atherosclerosis 11/28/2022   Flexion contracture of joint of foot, right 11/28/2022   Protein-calorie malnutrition, severe 11/24/2022   Acute metabolic encephalopathy 11/23/2022   Near syncope 11/22/2022   Thyroid nodule 07/08/2021   Thrombocytopenia, unspecified (HCC) 11/19/2020   Right spastic hemiparesis (HCC) 03/04/2020   Chronic otitis externa of right ear 02/20/2020   Intraparenchymal hemorrhage of brain (HCC)    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, hypertensive 01/26/2020   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   Conductive hearing loss of right ear with unrestricted hearing of left ear 05/28/2019   Hyponatremia 08/31/2017   Generalized weakness 08/31/2017   UTI (urinary tract infection) 08/31/2017   Right ovarian cyst 08/22/2016   Fibromyalgia 06/03/2015   Cystocele 03/18/2014   Diverticulosis of colon without hemorrhage 06/12/2013   Hypertension    Osteopenia    Chronic low back pain 05/12/2011   Anxiety  state 09/17/2007   GERD 09/17/2007   DJD (degenerative joint disease) 09/17/2007     Secondary Diagnoses: Hypertension*** Hyperlipidemia*** Diabetes Type II*** Atrial fibrillation*** Tobacco abuse***  Allergies as of 06/14/2023       Reactions   Gabapentin Anxiety   Elevated heart rate and crazy dreams   Prednisone Anxiety, Other (See Comments)   REACTION: funny feeling     Med Rec must be completed prior to using this SMARTLINK***       LABORATORY STUDIES CBC    Component Value Date/Time   WBC 5.8 06/12/2023 2049   RBC 4.11 06/12/2023 2049   HGB 12.6 06/12/2023 2050   HCT 37.0 06/12/2023 2050   PLT 212 06/12/2023 2049   MCV 89.5 06/12/2023 2049   MCH 31.1 06/12/2023 2049   MCHC 34.8 06/12/2023 2049   RDW 12.3 06/12/2023 2049   LYMPHSABS 3.0 06/12/2023 2049   MONOABS 0.5 06/12/2023 2049   EOSABS 0.1 06/12/2023 2049   BASOSABS 0.0 06/12/2023 2049   CMP    Component Value Date/Time   NA 150 (H) 06/14/2023 0605   K 4.1 06/12/2023 2050   CL 95 (L) 06/12/2023 2050   CO2 22 06/12/2023 2049   GLUCOSE 107 (H) 06/12/2023 2050   BUN 15 06/12/2023 2050   CREATININE 0.60 06/12/2023 2050   CREATININE 0.67 11/06/2020 1640   CALCIUM 9.2 06/12/2023 2049   PROT 6.2 (L) 06/12/2023 2049   ALBUMIN 3.7 06/12/2023 2049   AST 27 06/12/2023 2049   ALT 21 06/12/2023 2049   ALKPHOS 66 06/12/2023 2049   BILITOT 0.6 06/12/2023 2049   GFRNONAA >60 06/12/2023 2049   GFRAA >60 02/17/2020 0542  COAGS Lab Results  Component Value Date   INR 1.0 06/12/2023   INR 1.0 01/26/2020   INR 1.02 03/14/2014   Lipid Panel    Component Value Date/Time   CHOL 158 06/14/2023 0605   TRIG 38 06/14/2023 0605   HDL 84 06/14/2023 0605   CHOLHDL 1.9 06/14/2023 0605   VLDL 8 06/14/2023 0605   LDLCALC 66 06/14/2023 0605   HgbA1C  Lab Results  Component Value Date   HGBA1C 4.7 (L) 06/14/2023   Urine Drug Screen *** Alcohol Level    Component Value Date/Time   [facility] <10 06/12/2023  2049     SIGNIFICANT DIAGNOSTIC STUDIES DG Chest Port 1 View Result Date: 06/14/2023 CLINICAL DATA:  Acute respiratory failure EXAM: PORTABLE CHEST 1 VIEW COMPARISON:  Yesterday FINDINGS: Retrocardiac opacity with obscured diaphragm. A small left pleural effusion is possible. Normal heart size and mediastinal contours. Artifact from EKG leads. IMPRESSION: History of aspiration with worsening infiltrate at the left base. Electronically Signed   By: Tiburcio Pea M.D.   On: 06/14/2023 08:07   MR BRAIN W WO CONTRAST Result Date: 06/13/2023 CLINICAL DATA:  Neuro deficit, stroke suspected EXAM: MRI HEAD WITHOUT AND WITH CONTRAST TECHNIQUE: Multiplanar, multiecho pulse sequences of the brain and surrounding structures were obtained without and with intravenous contrast. CONTRAST:  5mL GADAVIST GADOBUTROL 1 MMOL/ML IV SOLN COMPARISON:  MRI head 04/16/2020, CT head 06/13/2023 FINDINGS: Brain: Redemonstrated acute parenchymal hemorrhage centered in the posterior left frontal lobe, which measures approximately 5.0 x 5.5 x 5 4 cm, previously 4.6 x 5.1 x 5.0 cm when remeasured similarly. Areas of contrast enhancement within the hematoma (series 10, image 33 and 40), concerning for active extravasation. Mass effect from the hematoma itself causing up to 8 mm of left-to-right midline shift. Surrounding T2 hyperintense signal, likely edema. Hemorrhage extends into the lateral, third, and fourth ventricles. Slight increase in the size of the ventricles compared to the 2021 exam, most prominent in the right temporal horn and left occipital horn. Diffuse subarachnoid hemorrhage, most prominently in the right sylvian fissure and right temporal lobe sulci. No evidence of underlying mass. Vascular: Grossly normal arterial flow voids, although evaluation is somewhat limited by motion. Normal arterial and venous enhancement. Skull and upper cervical spine: Normal marrow signal. Sinuses/Orbits: Mucosal thickening in the left  sphenoid sinus and bilateral ethmoid air cells. No acute finding in the orbits. Other: Fluid in the right-greater-than-left mastoid air cells. IMPRESSION: 1. Redemonstrated acute parenchymal hemorrhage centered in the posterior left frontal lobe, which measures approximately 5.0 x 5.5 x 5 4 cm, previously 4.6 x 5.1 x 5.0 cm when remeasured similarly. Areas of contrast enhancement within the hematoma, concerning for active extravasation. 2. Mass effect from the hematoma itself causing up to 8 mm of left-to-right midline shift. 3. Hemorrhage extends into the lateral, third, and fourth ventricles. Slight increase in the size of the ventricles compared to the 2021 exam, most prominent in the right temporal horn and left occipital horn, concerning for early obstructive hydrocephalus. 4. Diffuse subarachnoid hemorrhage, most prominently in the right Sylvian fissure and right temporal lobe sulci. These results will be called to the ordering clinician or representative by the Radiologist Assistant, and communication documented in the PACS or [employer]. Electronically Signed   By: Wiliam Ke M.D.   On: 06/13/2023 17:30   DG CHEST PORT 1 VIEW Result Date: 06/13/2023 CLINICAL DATA:  Aspiration pneumonitis. EXAM: PORTABLE CHEST 1 VIEW COMPARISON:  Nov 20, 2022. FINDINGS: The heart  size and mediastinal contours are within normal limits. Minimal bibasilar subsegmental atelectasis is noted. The visualized skeletal structures are unremarkable. IMPRESSION: Minimal bibasilar subsegmental atelectasis. Electronically Signed   By: Lupita Raider M.D.   On: 06/13/2023 14:49   EEG adult Result Date: 06/13/2023 Charlsie Quest, MD     06/13/2023 12:30 PM Patient Name: Alison Cuevas MRN: [id number] Epilepsy Attending: Charlsie Quest Referring Physician/Provider: Marjorie Smolder, NP Date: 06/13/2023 Duration: 26.53 mins Patient history: 77 y.o. female with hx of GERD, HLD, HTN, DDD, prior L parietal ICH who  presents with R sided weakness, L gaze, aphasia. Found to have L posterior frontal lobe ICH with IVH . EEG to evaluate for seizure Level of alertness: Awake AEDs during EEG study: None Technical aspects: This EEG study was done with scalp electrodes positioned according to the 10-20 International system of electrode placement. Electrical activity was reviewed with band pass filter of 1-70Hz , sensitivity of 7 uV/mm, display speed of 67mm/sec with a 60Hz  notched filter applied as appropriate. EEG data were recorded continuously and digitally stored.  Video monitoring was available and reviewed as appropriate. Description: No clear posterior rhythm seen.  EEG showed continuous generalized and maximal bifrontal 3 to 5 Hz theta-delta slowing. Hyperventilation and photic stimulation were not performed.   Of note, parts of study were difficult interpret due to significant movement artifact. ABNORMALITY -Continuous slow, generalized and maximal bifrontal IMPRESSION: This technically difficult study suggestive of cortical dysfunction in bifrontal region likely secondary to underlying structural abnormality.  Additionally there is moderate to severe diffuse encephalopathy.  No seizures or definite epileptiform discharges were noted. Charlsie Quest   ECHOCARDIOGRAM COMPLETE Result Date: 06/13/2023    ECHOCARDIOGRAM REPORT   Patient Name:   Alison Cuevas Date of Exam: 06/13/2023 Medical Rec #:  [id number]     Height:       60.0 in Accession #:    [id number]    Weight:       97.2 lb Date of Birth:  Nov 04, 1945      BSA:          1.374 m Patient Age:    45 years      BP:           137/69 mmHg Patient Gender: F             HR:           100 bpm. Exam Location:  Inpatient Procedure: 2D Echo, Color Doppler and Cardiac Doppler Indications:    Stroke I63.9  History:        Patient has prior history of Echocardiogram examinations, most                 recent 01/27/2020. Stroke; Risk Factors:Hypertension and                  Dyslipidemia.  Sonographer:    Harriette Bouillon RDCS Referring Phys: [id number] [facility] IMPRESSIONS  1. Left ventricular ejection fraction, by estimation, is 60 to 65%. The left ventricle has normal function. The left ventricle has no regional wall motion abnormalities. There is mild concentric left ventricular hypertrophy. Left ventricular diastolic parameters are consistent with Grade I diastolic dysfunction (impaired relaxation).  2. Right ventricular systolic function is normal. The right ventricular size is normal.  3. The mitral valve is normal in structure. Trivial mitral valve regurgitation. No evidence of mitral stenosis.  4. The aortic valve is tricuspid. There is mild calcification of the  aortic valve. Aortic valve regurgitation is not visualized. Aortic valve sclerosis/calcification is present, without any evidence of aortic stenosis.  5. The inferior vena cava is normal in size with greater than 50% respiratory variability, suggesting right atrial pressure of 3 mmHg. FINDINGS  Left Ventricle: Left ventricular ejection fraction, by estimation, is 60 to 65%. The left ventricle has normal function. The left ventricle has no regional wall motion abnormalities. The left ventricular internal cavity size was normal in size. There is  mild concentric left ventricular hypertrophy. Left ventricular diastolic parameters are consistent with Grade I diastolic dysfunction (impaired relaxation). Right Ventricle: The right ventricular size is normal. No increase in right ventricular wall thickness. Right ventricular systolic function is normal. Left Atrium: Left atrial size was normal in size. Right Atrium: Right atrial size was normal in size. Pericardium: There is no evidence of pericardial effusion. Mitral Valve: The mitral valve is normal in structure. Trivial mitral valve regurgitation. No evidence of mitral valve stenosis. Tricuspid Valve: The tricuspid valve is normal in structure. Tricuspid valve  regurgitation is trivial. No evidence of tricuspid stenosis. Aortic Valve: The aortic valve is tricuspid. There is mild calcification of the aortic valve. Aortic valve regurgitation is not visualized. Aortic valve sclerosis/calcification is present, without any evidence of aortic stenosis. Pulmonic Valve: The pulmonic valve was normal in structure. Pulmonic valve regurgitation is trivial. No evidence of pulmonic stenosis. Aorta: The aortic root is normal in size and structure. Venous: The inferior vena cava is normal in size with greater than 50% respiratory variability, suggesting right atrial pressure of 3 mmHg. IAS/Shunts: No atrial level shunt detected by color flow Doppler.  LEFT VENTRICLE PLAX 2D LVIDd:         3.90 cm   Diastology LVIDs:         2.70 cm   LV e' lateral: 6.96 cm/s LV PW:         1.00 cm LV IVS:        1.10 cm LVOT diam:     1.80 cm LV SV:         68 LV SV Index:   49 LVOT Area:     2.54 cm  RIGHT VENTRICLE             IVC RV S prime:     16.70 cm/s  IVC diam: 1.80 cm TAPSE (M-mode): 2.4 cm LEFT ATRIUM           Index LA diam:      2.80 cm 2.04 cm/m LA Vol (A4C): 25.5 ml 18.56 ml/m  AORTIC VALVE LVOT Vmax:   150.00 cm/s LVOT Vmean:  102.000 cm/s LVOT VTI:    0.267 m  AORTA Ao Root diam: 3.20 cm Ao Asc diam:  3.40 cm  SHUNTS Systemic VTI:  0.27 m Systemic Diam: 1.80 cm Arvilla Meres MD Electronically signed by Arvilla Meres MD Signature Date/Time: 06/13/2023/9:47:39 AM    Final    CT HEAD WO CONTRAST Result Date: 06/13/2023 CLINICAL DATA:  Stroke follow-up EXAM: CT HEAD WITHOUT CONTRAST TECHNIQUE: Contiguous axial images were obtained from the base of the skull through the vertex without intravenous contrast. RADIATION DOSE REDUCTION: This exam was performed according to the departmental dose-optimization program which includes automated exposure control, adjustment of the mA and/or kV according to patient size and/or use of iterative reconstruction technique. COMPARISON:  Yesterday  FINDINGS: Brain: Acute parenchymal hemorrhage centered in the posterior left frontal lobe with rim of edema. Especially laterally there is increase in blood  clot, dimensions at this level on 2:21 measure 5.1 x 3.9 cm as compared to 4.4 x 3.5 cm previously. There is intraventricular blood clot with increase, now seen in the right lateral ventricle. Newly seen subarachnoid hemorrhage along the right sylvian fissure, favor redistribution. Midline shift measures up to 5 mm, measurement taken on coronal reformats given head obliquity. Small chronic right cerebellar infarcts. No hydrocephalus. Vascular: No hyperdense vessel or unexpected calcification. Skull: Normal. Negative for fracture or focal lesion. Sinuses/Orbits: Right mastoidectomy which is clear. Chronic left sphenoid sinusitis with complete opacification and sclerotic wall thickening. IMPRESSION: Large left cerebral ICH with increased clot in both the parenchyma and ventricular system. Midline shift measures 5 mm. No hydrocephalus. Electronically Signed   By: Tiburcio Pea M.D.   On: 06/13/2023 08:03   CT ANGIO HEAD NECK W WO CM (CODE STROKE) Result Date: 06/12/2023 CLINICAL DATA:  Right-sided weakness EXAM: CT ANGIOGRAPHY HEAD AND NECK WITH AND WITHOUT CONTRAST TECHNIQUE: Multidetector CT imaging of the head and neck was performed using the standard protocol during bolus administration of intravenous contrast. Multiplanar CT image reconstructions and MIPs were obtained to evaluate the vascular anatomy. Carotid stenosis measurements (when applicable) are obtained utilizing NASCET criteria, using the distal internal carotid diameter as the denominator. RADIATION DOSE REDUCTION: This exam was performed according to the departmental dose-optimization program which includes automated exposure control, adjustment of the mA and/or kV according to patient size and/or use of iterative reconstruction technique. CONTRAST:  75mL OMNIPAQUE IOHEXOL 350 MG/ML SOLN  COMPARISON:  Same day CT head FINDINGS: CT HEAD FINDINGS See same day CT head for intracranial findings CTA NECK FINDINGS Aortic arch: Standard branching. Imaged portion shows no evidence of aneurysm or dissection. No significant stenosis of the major arch vessel origins. Right carotid system: No evidence of dissection, stenosis (50% or greater), or occlusion. Left carotid system: No evidence of dissection, stenosis (50% or greater), or occlusion. Vertebral arteries: Left dominant. No evidence of dissection, stenosis (50% or greater), or occlusion. Skeleton: Status post C3-C6 ACDF Other neck: Assessment of the cervical soft tissues is limited due to the degree of motion. Upper chest: Negative. Review of the MIP images confirms the above findings CTA HEAD FINDINGS Anterior circulation: No significant stenosis, proximal occlusion, aneurysm, or vascular malformation. Posterior circulation: No significant stenosis, proximal occlusion, aneurysm, or vascular malformation. Venous sinuses: As permitted by contrast timing, patent. Anatomic variants: Fetal PCA on the left Review of the MIP images confirms the above findings IMPRESSION: 1. No intracranial large vessel occlusion or significant stenosis. 2. No hemodynamically significant stenosis in the neck. 3. See same day CT head for intracranial findings. Electronically Signed   By: Lorenza Cambridge M.D.   On: 06/12/2023 21:16   CT HEAD CODE STROKE WO CONTRAST Result Date: 06/12/2023 CLINICAL DATA:  Code stroke.  Right-sided weakness EXAM: CT HEAD WITHOUT CONTRAST TECHNIQUE: Contiguous axial images were obtained from the base of the skull through the vertex without intravenous contrast. RADIATION DOSE REDUCTION: This exam was performed according to the departmental dose-optimization program which includes automated exposure control, adjustment of the mA and/or kV according to patient size and/or use of iterative reconstruction technique. COMPARISON:  CT head 11/20/22  FINDINGS: Brain: 4.7 x 4.1 x 4.9 cm left posterior frontal lobe parenchymal hemorrhage with a hematocrit level and intraventricular extension. There are layering blood products in the left occipital horn. There is 3 mm rightward midline shift. There is a background moderate chronic microvascular ischemic change. Vascular: No hyperdense vessel or  unexpected calcification. Skull: Normal. Negative for fracture or focal lesion. Sinuses/Orbits: Small right mastoid effusion. Postsurgical changes from a wall up mastoidectomy. No middle ear effusion. Paranasal sinuses are notable for complete opacification of the left sphenoid sinus. Orbits are unremarkable. Other: None. IMPRESSION: 4.7 x 4.1 x 4.9 cm left posterior frontal lobe parenchymal hemorrhage with intraventricular extension. 3 mm rightward midline shift. Findings were paged to Dr. Derry Lory on 06/12/23 at 8:59 PM. Electronically Signed   By: Lorenza Cambridge M.D.   On: 06/12/2023 21:01       HISTORY OF PRESENT ILLNESS 77 y.o. patient with history of GERD, hyperlipidemia, hypertension, degenerative disc disease and prior left parietal ICH presenting with right-sided weakness, left gaze deviation and aphasia. She was noted to have sudden onset of the symptoms at her assisted living facility around 1920 last night. EMS was called, and she was brought to the ED emergency department and found to have a large left posterior frontal lobe ICH. Some expansion was noted on the ICH on follow-up CT in the morning. Due to poor exam on 12/17, hypertonic saline was started.   HOSPITAL COURSE Intraparenchymal Hemorrhage:  left frontal lobe IPH with intraventricular extension Etiology: Hypertensive versus indeterminate  code Stroke CT head left posterior frontal lobe parenchymal hemorrhage with intraventricular extension and 3 mm of rightward midline shift CTA head & neck no LVO or hemodynamically significant stenosis, no evidence of AVM or aneurysm Follow-up CT 12/17:  Slightly larger left cerebral ICH with increased clot in the parenchyma and ventricular system and 5 mm of midline shift, no hydrocephalus MRI pending 2D Echo EF 60 to 65%, grade 1 diastolic dysfunction, normal left atrial size, no atrial level shunt LDL 87 HgbA1c No results found for requested labs within last 1095 days. EEG reveals cortical dysfunction in bifrontal region with moderate to severe diffuse encephalopathy and no seizures or epileptiform discharges VTE prophylaxis -SCDs aspirin 81 mg daily prior to admission, now on No antithrombotic secondary to IPH Therapy recommendations:  Pending Disposition: Pending   Hx of Stroke/TIA Patient has a history of left frontoparietal hemorrhage in 2021, likely hypertensive or hemorrhagic infarct   Cerebral edema (brain compression) Patient has increasing midline shift of 5 mm seen on follow-up CT overnight We will start 3% hypertonic saline with 250 cc bolus to begin at 75 cc/h Sodium goal 150-155 Monitor sodium every 6 hours, last 131   Aspiration pneumonia Patient noted to have emesis overnight with aspiration Abx discontinued for comfort care   Hypertension Home meds: Lisinopril 10 mg daily, verapamil 180 mg daily Unstable, requiring Cleviprex We will start as needed labetalol and hydralazine Blood Pressure Goal: SBP between 130-150 for 24 hours and then less than 160    Hyperlipidemia Home meds: None LDL 87, goal < 70 Will start statin at discharge   Dysphagia Patient has post-stroke dysphagia, SLP consulted      Diet    Diet NPO time specified        Advance diet as tolerated  DISCHARGE EXAM  PHYSICAL EXAM General:  Alert, well-nourished, well-developed patient in no acute distress Psych:  Mood and affect appropriate for situation CV: Regular rate and rhythm on monitor Respiratory:  Regular, unlabored respirations on room air GI: Abdomen soft and nontender  NEURO:  Mental Status: AA&Ox3  Speech/Language:  speech is without dysarthria or aphasia.  Naming, repetition, fluency, and comprehension intact.  Cranial Nerves:  II: PERRL. Visual fields full.  III, IV, VI: EOMI. Eyelids elevate symmetrically.  V:  Sensation is intact to light touch and symmetrical to face.  VII: Smile is symmetrical.  VIII: hearing intact to voice. IX, X: Palate elevates symmetrically. Phonation is normal.  HK:[id number] shrug 5/5. XII: tongue is midline without fasciculations. Motor: 5/5 strength to all muscle groups tested.  Tone: is normal and bulk is normal Sensation- Intact to light touch bilaterally. Extinction absent to light touch to DSS. Coordination: FTN intact bilaterally, HKS: no ataxia in BLE.No drift.  Gait- deferred  1a Level of Conscious.: *** 1b LOC Questions: *** 1c LOC Commands: *** 2 Best Gaze: *** 3 Visual: *** 4 Facial Palsy: *** 5a Motor Arm - left: *** 5b Motor Arm - Right: *** 6a Motor Leg - Left: *** 6b Motor Leg - Right: *** 7 Limb Ataxia: *** 8 Sensory: *** 9 Best Language: *** 10 Dysarthria: *** 11 Extinct. and Inatten.: *** TOTAL: ***   Discharge Diet       There are no active orders of the following types: Diet, Nourishments.   liquids  DISCHARGE PLAN Disposition: {DISCHARGE DESTINATION_TRH:27031} {anticoagulants:31417} for secondary stroke prevention for 3 {days/wks/mos/yrs:310907} then {anticoagulants:31417} alone. Ongoing stroke risk factor control by Primary Care Physician at time of discharge Follow-up PCP Eloisa Northern, MD in 2 weeks. Follow up with *** Follow-up in [facility] in 8 weeks, office to schedule an appointment. ***Able to see NP in clinic.  *** minutes were spent preparing discharge.  .sign

## 2023-06-14 NOTE — Progress Notes (Signed)
Family at bedside, continue comfort measures.

## 2023-06-14 NOTE — Progress Notes (Signed)
NAME:  Alison Cuevas, MRN:  [id number], DOB:  04-13-1946, LOS: 2 ADMISSION DATE:  06/12/2023, CONSULTATION DATE:  06/13/2023 REFERRING MD:  Pearlean Brownie - Stroke Neurology, CHIEF COMPLAINT:  aspiration.   History of Present Illness:  77 year old woman who presented 12/16 with acute right hemiplegia and left gaze deviation from SNF. Was found to have a large posterior frontal ICH with midline shift. ICH score 2.  Patient has been having repeated episodes of aspiration, the latest following an episode of emesis. Patient has been NPO and failed bedside swallowing evaluation.  Has not eaten since yesterday at 5pm.  Son states that the patient has been in declining health since her prior stroke in 2021.   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    Significant Hospital Events: Including procedures, antibiotic start and stop dates in addition to other pertinent events   12/17 - admitted for large frontal ICH  Interim History / Subjective:  Episode of vomiting this morning with hypoxia.  Tachy. On NRB   Objective   Blood pressure (!) 145/66, pulse (!) 122, temperature 99.1 F (37.3 C), temperature source Axillary, resp. rate (!) 25,  weight 44.1 kg, last menstrual period 06/27/1972, SpO2 93%.    FiO2 (%):  [100 %] 100 %   Intake/Output Summary (Last 24 hours) at 06/14/2023 0850 Last data filed at 06/14/2023 0800 Gross per 24 hour  Intake 1673.33 ml  Output 3450 ml  Net -1776.67 ml   Filed Weights   06/12/23 2000 06/12/23 2210  Weight: 60.6 kg 44.1 kg   Examination: General: frail, chronically ill appearing female  HENT: mm dry, NRB  Lungs: resps even non labor, mild tachypnea, diminished bases otherwise clear Cardiovascular: HS normal Abdomen: Soft Extremities: warm and dry, no sig edema  Neuro: responds to pain with LUE, does not follow commands, does not open eyes to voice  GU: Purewick in place.   Ancillary Tests Personally Reviewed:  CXR with worsening LLL infiltrate   Assessment & Plan:   Acute hypoxic respiratory failure due to recurrent aspiration from large ICH ICH likely due to cerebral amyloid angiopathy based on location.  Prior stroke with frailty.  HTN  Plan:  - Titrate O2 to keep saturation >92%. No BiPAP given aspiration. Have confirmed no intubation or CPR with son. If breathing worsens, will transition to comfort care.  -Keep SBP 130-150 to prevent rebleeding.  - 3% saline to reduce edema and midline shift. Target Na 150-155.  - continue Unasyn for aspiration PNA  - EEG done - no seizure - ongoing goals of care discussion, consider transition to comfort if respiratory status declines further    Best Practice (right click and "Reselect all SmartList Selections" daily)   Diet/type: NPO DVT prophylaxis SCD Pressure ulcer(s): N/A GI prophylaxis: N/A Lines: N/A Foley:  N/A Code Status:  DNR Last date of multidisciplinary goals of care discussion [spoke to son 12/17- poor pre-morbid quality  of life - confirms DNR/DNI]  CRITICAL CARE Performed by: Danford Bad   Total critical care time: 32 minutes  Critical care time was exclusive of separately billable procedures and  treating other patients.  Critical care was necessary to treat or prevent imminent or life-threatening deterioration.

## 2023-06-14 NOTE — Progress Notes (Signed)
PT Cancellation Note  Patient Details Name: Alison Cuevas MRN: [id number] DOB: 1945-12-06   Cancelled Treatment:    Reason Eval/Treat Not Completed: Other (comment) ((Discussed with RN, pt transitioning to comfort care. PT to sign off)   Eliseo Gum Hafsa Lohn 06/14/2023, 12:05 PM 06/14/2023  Jacinto Halim., PT Acute Rehabilitation Services [phone]  (office)

## 2023-06-15 DIAGNOSIS — Z789 Other specified health status: Secondary | ICD-10-CM

## 2023-06-15 DIAGNOSIS — Z66 Do not resuscitate: Secondary | ICD-10-CM

## 2023-06-15 DIAGNOSIS — I611 Nontraumatic intracerebral hemorrhage in hemisphere, cortical: Secondary | ICD-10-CM

## 2023-06-15 MED ORDER — PROCHLORPERAZINE EDISYLATE 10 MG/2ML IJ SOLN: 5.0000 mg | Freq: Once | INTRAMUSCULAR | Status: DC | PRN

## 2023-06-15 NOTE — Progress Notes (Addendum)
STROKE TEAM PROGRESS NOTE   BRIEF HPI [name] is a 77 y.o. female with history of GERD, hyperlipidemia, hypertension, degenerative disc disease and prior left parietal ICH presenting with right-sided weakness, left gaze deviation and aphasia.  She was noted to have sudden onset of the symptoms at her assisted living facility around 1920 last night.  EMS was called, and she was brought to the ED emergency department and found to have a large left posterior frontal lobe ICH.  Some expansion was noted on the ICH on follow-up CT in the morning.  Due to poor exam on 12/17, hypertonic saline was started. 12/18 2 episodes of aspiration, decision made to pursue comfort care.   NIH on Admission 27  ICH score 2  SIGNIFICANT HOSPITAL EVENTS 12/16-patient admitted with large left frontal ICH 12/17-hypertonic saline started 12/18 - transition to comfort care  INTERIM HISTORY/SUBJECTIVE Patient has been transition to full comfort care measures.  Patient, laying in bed, eyes closed, comfortable on morphine drip..   OBJECTIVE  CBC    Component Value Date/Time   WBC 5.8 06/12/2023 2049   RBC 4.11 06/12/2023 2049   HGB 12.6 06/12/2023 2050   HCT 37.0 06/12/2023 2050   PLT 212 06/12/2023 2049   MCV 89.5 06/12/2023 2049   MCH 31.1 06/12/2023 2049   MCHC 34.8 06/12/2023 2049   RDW 12.3 06/12/2023 2049   LYMPHSABS 3.0 06/12/2023 2049   MONOABS 0.5 06/12/2023 2049   EOSABS 0.1 06/12/2023 2049   BASOSABS 0.0 06/12/2023 2049    BMET    Component Value Date/Time   NA 150 (H) 06/14/2023 0605   K 4.1 06/12/2023 2050   CL 95 (L) 06/12/2023 2050   CO2 22 06/12/2023 2049   GLUCOSE 107 (H) 06/12/2023 2050   BUN 15 06/12/2023 2050   CREATININE 0.60 06/12/2023 2050   CREATININE 0.67 11/06/2020 1640   CALCIUM 9.2 06/12/2023 2049   GFRNONAA >60 06/12/2023 2049    IMAGING past 24 hours No results found.   Vitals:   06/15/23 0700 06/15/23 0800 06/15/23 0900 06/15/23 1000  BP:       Pulse: (!) 101 (!) 102 (!) 102 (!) 102  Resp: 13 12 13 13   Temp:      TempSrc:      SpO2: (!) 82% (!) 82% (!) 81% (!) 82%  Weight:         PHYSICAL EXAM General: Drowsy, frail-appearing elderly patient in no acute distress CV: Tachycardic  Respiratory:  Nonrebreather in place  NEURO:  Patient rests with eyes closed and does not respond to voice or follow commands.  Globally aphasic and mute.  Pupils are equal round and reactive, oculocephalic reflex is present.  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 Etiology: Hypertensive versus indeterminate  code Stroke CT head left posterior frontal lobe parenchymal hemorrhage with intraventricular extension and 3 mm of rightward midline shift CTA head & neck no LVO or hemodynamically significant stenosis, no evidence of AVM or aneurysm Follow-up CT 12/17: Slightly larger left cerebral ICH with increased clot in the parenchyma and ventricular system and 5 mm of midline shift, no hydrocephalus MRI large parenchymal hemorrhage centered in the posterior left frontal lobe measuring 5 x 5.5 x 5.4 cm slightly increased from previous CT scan.  Mass effect causing 8 mm left-to-right midline  shift.  Hemorrhage extends into the lateral, third and fourth ventricles slight increase in ventricular size.   2D Echo EF 60 to 65%, grade 1 diastolic dysfunction, normal left atrial size, no atrial level shunt LDL 87 HgbA1c No results found for requested labs within last 1095 days. EEG reveals cortical dysfunction in bifrontal region with moderate to severe diffuse encephalopathy and no seizures or epileptiform discharges VTE prophylaxis -SCDs aspirin 81 mg daily prior to admission, now on No antithrombotic secondary to IPH Therapy recommendations:  Comfort care Disposition: 6N Palliative care  Hx of Stroke/TIA Patient has a history of left frontoparietal hemorrhage in 2021, likely hypertensive or hemorrhagic infarct  Cerebral edema (brain compression) Patient has increasing midline shift of 5 mm seen on follow-up CT overnight 3% hypertonic saline with 250 cc bolus with infusion at 75 cc/h - discontinued for comfort meausres  Aspiration pneumonia Patient noted to have emesis overnight with aspiration Started on Unasyn 12/17 - now discontinued for comfort care  Hypertension Home meds: Lisinopril 10 mg daily, verapamil 180 mg daily  Hyperlipidemia Home meds: None LDL 87, goal < 70  DNR/Comfort measures only Morphine infusion PRN ativan, Robinul Plan for discharge to hosp[facility] center- [facility] and palliative assistance appreciated  Hospital day # 3  Patient seen and examined by NP/APP with MD. MD to update note as needed.   Elmer Picker, DNP, FNP-BC [facility] Pager: [phone] I have personally obtained history,examined this patient, reviewed notes, independently viewed imaging studies, participated in medical decision making and plan of care.ROS completed by me personally and pertinent positives fully documented  I have made any additions or clarifications directly to the above note. Agree with note above.  Patient has been transition to full comfort care measures.  Continue morphine drip.  Transfer to neurology floor bed for ongoing hospice care.  Delia Heady, MD Medical Director [facility] St[facility] Pager: [phone] 06/15/2023 3:53 PM   To contact Stroke Continuity provider, please refer to [email or web address]. After hours, contact General Neurology

## 2023-06-15 NOTE — Progress Notes (Signed)
Daily Progress Note   Patient Name: Alison Cuevas       Date: 06/15/2023 DOB: [birth year]  Age: 77 y.o. MRN#: [id number] Attending Physician: Stroke, Md, MD Primary Care Physician: Eloisa Northern, MD Admit Date: 06/12/2023  Reason for Consultation/Follow-up: Non pain symptom management, Pain control, Psychosocial/spiritual support, and Terminal Care  Subjective: Discussed case with Elmer Picker, NP.  I have reviewed medical records including EPIC notes, MAR, and labs. Received report from primary RN - no acute concerns. Noted O2 saturations in the low 80s upper 70s overnight and this morning.  Went to visit patient at bedside - 4 family/visitors present. Patient was lying in bed unresponsive to voice/gentle touch. No signs or non-verbal gestures of pain or discomfort noted. No respiratory distress, increased work of breathing, or secretions noted. Morphine drip infusing at 10mg /hr.   Emotional support provided to family. Family confirm change of plans - they wish for patient to remain in house for EOL care. They do not want to risk patient passing away during transport. Prognosis reviewed. Family have no concerns at this time. They express appreciation for staff/PMT assistance.  Coffee provided per their request.   All questions and concerns addressed.   Length of Stay: 3  Current Medications: Scheduled Meds:   scopolamine  1 patch Transdermal Q72H    Continuous Infusions:  morphine 10 mg/hr (06/15/23 1000)    PRN Meds: acetaminophen **OR** acetaminophen, glycopyrrolate **OR** glycopyrrolate **OR** glycopyrrolate, LORazepam, morphine, ondansetron (ZOFRAN) IV, polyvinyl alcohol, prochlorperazine  Physical Exam Vitals and nursing note reviewed.  Constitutional:      General: She is not  in acute distress.    Appearance: She is cachectic. She is ill-appearing.  Pulmonary:     Effort: No respiratory distress.  Skin:    General: Skin is warm and dry.  Neurological:     Mental Status: She is unresponsive.             Vital Signs: BP (!) 141/71   Pulse (!) 103   Temp 99.1 F (37.3 C) (Axillary)   Resp 13   Wt 44.1 kg   LMP 06/27/1972 Comment: not sexually active  SpO2 (!) 82%   BMI 18.99 kg/m  SpO2: SpO2: (!) 82 % O2 Device: O2 Device: Room Air O2 Flow Rate: O2 Flow Rate (L/min): 15 L/min  Intake/output summary:  Intake/Output Summary (Last 24 hours) at 06/15/2023 1232 Last data filed at 06/15/2023 1000 Gross per 24 hour  Intake 250.8 ml  Output --  Net 250.8 ml   LBM: Last BM Date :  (PTA) Baseline Weight: Weight: 60.6 kg Most recent weight: Weight: 44.1 kg       Palliative Assessment/Data: PPS 10%      Patient Active Problem List   Diagnosis Date Noted   Aspiration pneumonia of left lung (HCC) 06/14/2023   Acute respiratory failure with hypoxia (HCC) 06/14/2023   Edema of right ankle 12/19/2022   History of CVA (cerebrovascular accident) 11/28/2022   Chronic constipation 11/28/2022   Failure to thrive in adult 11/28/2022   Hyperlipidemia LDL goal <100 11/28/2022   Chronic non-seasonal allergic rhinitis 11/28/2022   Chronic vertigo 11/28/2022   Aortic atherosclerosis (HCC) 11/28/2022   Coronary atherosclerosis 11/28/2022   Flexion contracture of joint of foot, right 11/28/2022   Protein-calorie malnutrition, severe 11/24/2022   Acute metabolic encephalopathy 11/23/2022   Near syncope 11/22/2022   Thyroid nodule 07/08/2021   Thrombocytopenia, unspecified (HCC) 11/19/2020   Right spastic hemiparesis (HCC) 03/04/2020   Chronic otitis externa of right ear 02/20/2020   Intraparenchymal hemorrhage of brain (HCC)    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, hypertensive 01/26/2020   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    Conductive hearing loss of right ear with unrestricted hearing of left ear 05/28/2019   Hyponatremia 08/31/2017   Generalized weakness 08/31/2017   UTI (urinary tract infection) 08/31/2017   Right ovarian cyst 08/22/2016   Fibromyalgia 06/03/2015   Cystocele 03/18/2014   Diverticulosis of colon without hemorrhage 06/12/2013   Hypertension    Osteopenia    Chronic low back pain 05/12/2011   Anxiety state 09/17/2007   GERD 09/17/2007   DJD (degenerative joint disease) 09/17/2007    Palliative Care Assessment & Plan   Patient Profile: Per intake H&P --> Alison Cuevas is a 77 y.o. female with hx of GERD, HLD, HTN, DDD, prior L parietal ICH who presents with R sided weakness, L gaze, aphasia. Was found to have a large posterior frontal ICH with midline shift.    Palliative care asked to get involved to further support conversations related to hospice at home versus residential setting.   Assessment: Principal Problem:   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, hypertensive Active Problems:   Aspiration pneumonia of left lung (HCC)   Acute respiratory failure with hypoxia (HCC)   Terminal care  Recommendations/Plan: Continue full comfort measures Continue DNR/DNI as previously documented Family are no longer interested in patient's discharge to residential hospice facility. Anticipate hospital death Continue current comfort focused medication regimen - no changes Added orders to reflect full comfort measures as well as discontinued orders that were not focused on comfort Transfer to 6N/any med-surg when bed available PMT will continue to follow and support holistically   Symptom Management Continuous morphine infusion; PRN doses for breakthrough pain/dyspnea/increased work of breathing/RR>25 Tylenol PRN pain/fever Robinul PRN secretions Scopolamine patch Ativan PRN anxiety/seizure/sleep/distress Zofran PRN nausea/vomiting Compazine once PRN refractory  nausea Liquifilm Tears PRN dry eye   Goals of Care and Additional Recommendations: Limitations on Scope of Treatment: Full Comfort Care  Code Status:    Code Status Orders  (From admission, onward)           Start     Ordered   06/14/23 1000  Do not attempt resuscitation (DNR) - Comfort care  Continuous  Question Answer Comment  If patient has no pulse and is not breathing Do Not Attempt Resuscitation   In Pre-Arrest Conditions (Patient Is Breathing and Has a Pulse) Provide comfort measures. Relieve any mechanical airway obstruction. Avoid transfer unless required for comfort.   Consent: Discussion documented in EHR or advanced directives reviewed      06/14/23 0959           Code Status History     Date Active Date Inactive Code Status Order ID Comments User Context   06/12/2023 2202 06/14/2023 0959 Limited: Do not attempt resuscitation (DNR) -DNR-LIMITED -Do Not Intubate/DNI  [id number]  Erick Blinks, MD ED   06/12/2023 2121 06/12/2023 2202 Full Code [id number]  Erick Blinks, MD ED   11/22/2022 2214 11/25/2022 1619 Full Code [id number]  Zierle-Ghosh, Asia B, DO ED   01/29/2020 1757 02/20/2020 1616 Full Code [id number]  Jacquelynn Cree, PA-C Inpatient   01/26/2020 2124 01/29/2020 1705 Full Code [id number]  Milon Dikes, MD ED   08/31/2017 1353 09/01/2017 1643 Full Code [id number]  Cleora Fleet, MD ED   03/18/2014 1111 03/20/2014 1737 Full Code [id number]  Harrie Foreman, PA-C Inpatient       Prognosis:  Hours - Days  Discharge Planning: Anticipated Hospital Death  Care plan was discussed with primary RN, family at bedside, Neuro NP  Thank you for allowing the Palliative Medicine Team to assist in the care of this patient.     Haskel Khan, NP  Please contact Palliative Medicine Team phone at [phone] for questions and concerns.   *Portions of this note are a verbal dictation therefore any spelling and/or grammatical errors are due to the "Dragon  [employer] One" system interpretation.

## 2023-06-15 NOTE — Plan of Care (Signed)
Problem: Education: Goal: Knowledge of General Education information will improve Description: Including pain rating scale, medication(s)/side effects and non-pharmacologic comfort measures Outcome: Progressing Pt was admitted into the hospital for left parietal ICH who presents with R sided weakness, L gaze, aphasia.  Pt was admitted into the ICU for a stroke but her acuity has changed drastically.  She is currently at EOL and comfort care measures are being implemented.    Problem: Clinical Measurements: Goal: Will remain free from infection Outcome: Progressing S/Sx of infection monitored and assessed q-shift.  Pt has remained afebrile thus far.    Problem: Clinical Measurements: Goal: Respiratory complications will improve Outcome: Progressing Pt is currently at EOL and comfort care measures are being implemented.  She is on a morphine infusion for comfort, pain and respiratory distress.    Problem: Clinical Measurements: Goal: Cardiovascular complication will be avoided Outcome: Progressing Pt is currently at EOL and comfort care measures are being implemented.   Problem: Activity: Goal: Risk for activity intolerance will decrease Outcome: Progressing Pt is currently at EOL and comfort care measures are being implemented.  She is dependent of all her ADLs.  Pt cannot get up OOB independently.  She is bed-bound and needs the assistance of RN staff to reposition/ turn her in the bed.     Problem: Nutrition: Goal: Adequate nutrition will be maintained Outcome: Progressing Pt is currently at EOL and comfort care measures are being implemented.   Problem: Safety: Goal: Ability to remain free from injury will improve Outcome: Progressing Pt has remained free from falls thus far.  Instructed pt to utilize RN call light for assistance.  Hourly rounds performed.  Bed alarm implemented to keep pt safe from falls.  Settings activated to third most sensitive mode.  Bed in lowest position,  locked with two upper side rails engaged.  Belongings and call light within reach.    Problem: Skin Integrity: Goal: Risk for impaired skin integrity will decrease Outcome: Progressing Skin integrity monitored and assessed q-shift.  Pt is on q2 hourly turns to prevent further skin impairment.  Tubes and drains assessed for device related pressures sores.  Pt has a colostomy and urostomy to capture her excrements along with a left PCN.

## 2023-06-28 NOTE — Death Summary Note (Signed)
Patient ID: Alison Cuevas MRN: [id number] DOB/AGE: 03-10-1946 78 y.o.  Admit date: 06-13-23 Death date: June 17, 2023  Admission Diagnoses: Aphasia and right-sided weakness  Cause of Death: Left frontal parenchymal hemorrhage with intraventricular extension and cytotoxic edema.  Patient made comfort care and DNR by family  Pertinent Medical Diagnosis: Principal Problem:   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, hypertensive Active Problems:   Aspiration pneumonia of left lung (HCC)   Acute respiratory failure with hypoxia (HCC) Cytotoxic edema Intraventricular hemorrhage   Hospital Course: Mr. [name] was a 78 year old lady with past medical history of gastroesophageal flux disease, hypertension, hyperlipidemia and prior left parietal intracerebral hemorrhage who presented on June 13, 2023 with sudden onset of aphasia right hemiparesis and left gaze deviation.  CT head showed a large parenchymal left posterior frontal lobar hematoma with 3 mm left-to-right midline shift and intraventricular extension.  ICH score was 2.  She was admitted to the intensive care unit and closely monitored.  Blood pressure was tightly controlled.  CT angiogram showed no large vessel stenosis or occlusion.  Follow-up CT scan showed slightly larger left-sided intracerebral hemorrhage with increased clot burden in the ventricular system with 5 mm left-to-right midline shift but no hydrocephalus.  Patient's prognosis is quite poor and she will clearly need feeding tube and 24-hour nursing care.  Family understood poor prognosis and made patient DNR and comfort care measures only.  She has started on morphine and kept comfortable.  She passed away peacefully.  Signed: Delia Heady 06/19/2023, 4:48 PM

## 2023-06-28 NOTE — Progress Notes (Signed)
Wasted 110 ml of morphine. Verified by Lajoyce Corners charge RN.  Pharmacy was called.    Alison Cuevas

## 2023-06-28 DEATH — deceased

## 2023-07-26 ENCOUNTER — Ambulatory Visit: Payer: Medicare Other

## 2023-09-06 ENCOUNTER — Ambulatory Visit: Payer: Medicare Other | Admitting: Physical Medicine & Rehabilitation
